# Patient Record
Sex: Male | Born: 1935
Health system: Southern US, Community
[De-identification: ages and names within clinical notes are randomized; demographics above are authoritative.]

## PROBLEM LIST (undated history)

## (undated) DIAGNOSIS — G629 Polyneuropathy, unspecified: Secondary | ICD-10-CM

## (undated) DIAGNOSIS — M25569 Pain in unspecified knee: Secondary | ICD-10-CM

## (undated) DIAGNOSIS — I5022 Chronic systolic (congestive) heart failure: Secondary | ICD-10-CM

## (undated) DIAGNOSIS — M353 Polymyalgia rheumatica: Secondary | ICD-10-CM

## (undated) DIAGNOSIS — K219 Gastro-esophageal reflux disease without esophagitis: Secondary | ICD-10-CM

## (undated) DIAGNOSIS — I10 Essential (primary) hypertension: Secondary | ICD-10-CM

## (undated) DIAGNOSIS — M1990 Unspecified osteoarthritis, unspecified site: Secondary | ICD-10-CM

## (undated) DIAGNOSIS — Z8719 Personal history of other diseases of the digestive system: Secondary | ICD-10-CM

## (undated) DIAGNOSIS — E785 Hyperlipidemia, unspecified: Secondary | ICD-10-CM

## (undated) HISTORY — PX: VARICOSE VEIN SURGERY: SHX832

## (undated) HISTORY — PX: TONSILLECTOMY: SUR1361

## (undated) HISTORY — DX: Polymyalgia rheumatica: M35.3

## (undated) HISTORY — PX: CATARACT EXTRACTION: SUR2

## (undated) HISTORY — DX: Pain in unspecified knee: M25.569

## (undated) HISTORY — DX: Hyperlipidemia, unspecified: E78.5

## (undated) HISTORY — DX: Gastro-esophageal reflux disease without esophagitis: K21.9

## (undated) HISTORY — DX: Unspecified osteoarthritis, unspecified site: M19.90

## (undated) HISTORY — PX: CARPAL TUNNEL RELEASE: SHX101

## (undated) HISTORY — DX: Essential (primary) hypertension: I10

---

## 1999-11-01 ENCOUNTER — Encounter: Payer: Self-pay | Admitting: *Deleted

## 1999-11-01 ENCOUNTER — Emergency Department (HOSPITAL_COMMUNITY): Admission: EM | Admit: 1999-11-01 | Discharge: 1999-11-01 | Payer: Self-pay | Admitting: *Deleted

## 2000-01-22 ENCOUNTER — Other Ambulatory Visit: Admission: RE | Admit: 2000-01-22 | Discharge: 2000-01-22 | Payer: Self-pay | Admitting: Gastroenterology

## 2000-01-22 ENCOUNTER — Encounter (INDEPENDENT_AMBULATORY_CARE_PROVIDER_SITE_OTHER): Payer: Self-pay

## 2004-02-28 ENCOUNTER — Ambulatory Visit: Payer: Self-pay | Admitting: Internal Medicine

## 2004-03-06 ENCOUNTER — Ambulatory Visit: Payer: Self-pay | Admitting: Internal Medicine

## 2004-06-14 ENCOUNTER — Ambulatory Visit: Payer: Self-pay | Admitting: Internal Medicine

## 2004-08-08 ENCOUNTER — Ambulatory Visit: Payer: Self-pay | Admitting: Internal Medicine

## 2004-09-17 ENCOUNTER — Ambulatory Visit (HOSPITAL_COMMUNITY): Admission: RE | Admit: 2004-09-17 | Discharge: 2004-09-17 | Payer: Self-pay | Admitting: Neurology

## 2004-09-18 ENCOUNTER — Ambulatory Visit: Payer: Self-pay | Admitting: Internal Medicine

## 2004-11-21 ENCOUNTER — Ambulatory Visit: Payer: Self-pay | Admitting: Internal Medicine

## 2004-11-28 ENCOUNTER — Ambulatory Visit: Payer: Self-pay | Admitting: Internal Medicine

## 2005-02-05 ENCOUNTER — Ambulatory Visit: Payer: Self-pay | Admitting: Internal Medicine

## 2005-02-21 ENCOUNTER — Ambulatory Visit: Payer: Self-pay | Admitting: Internal Medicine

## 2005-02-28 ENCOUNTER — Ambulatory Visit: Payer: Self-pay | Admitting: Internal Medicine

## 2005-03-25 HISTORY — PX: COLONOSCOPY: SHX174

## 2005-04-24 ENCOUNTER — Ambulatory Visit: Payer: Self-pay | Admitting: Internal Medicine

## 2005-04-29 ENCOUNTER — Ambulatory Visit: Payer: Self-pay | Admitting: Internal Medicine

## 2005-05-23 ENCOUNTER — Ambulatory Visit: Payer: Self-pay | Admitting: Internal Medicine

## 2005-05-29 ENCOUNTER — Ambulatory Visit: Payer: Self-pay | Admitting: Internal Medicine

## 2005-07-24 ENCOUNTER — Ambulatory Visit: Payer: Self-pay | Admitting: Gastroenterology

## 2005-07-30 ENCOUNTER — Ambulatory Visit: Payer: Self-pay | Admitting: Gastroenterology

## 2005-10-29 ENCOUNTER — Ambulatory Visit: Payer: Self-pay | Admitting: Internal Medicine

## 2005-11-21 ENCOUNTER — Ambulatory Visit: Payer: Self-pay | Admitting: Internal Medicine

## 2005-11-22 ENCOUNTER — Ambulatory Visit: Payer: Self-pay | Admitting: Internal Medicine

## 2005-12-04 ENCOUNTER — Ambulatory Visit: Payer: Self-pay | Admitting: Internal Medicine

## 2006-01-15 ENCOUNTER — Ambulatory Visit: Payer: Self-pay | Admitting: Internal Medicine

## 2006-02-06 ENCOUNTER — Ambulatory Visit: Payer: Self-pay | Admitting: Internal Medicine

## 2006-04-01 ENCOUNTER — Ambulatory Visit: Payer: Self-pay | Admitting: Internal Medicine

## 2006-04-17 ENCOUNTER — Ambulatory Visit: Payer: Self-pay | Admitting: Internal Medicine

## 2006-06-19 ENCOUNTER — Ambulatory Visit: Payer: Self-pay | Admitting: Internal Medicine

## 2006-07-31 ENCOUNTER — Ambulatory Visit: Payer: Self-pay | Admitting: Internal Medicine

## 2006-09-16 ENCOUNTER — Ambulatory Visit: Payer: Self-pay | Admitting: Internal Medicine

## 2006-09-19 ENCOUNTER — Encounter: Admission: RE | Admit: 2006-09-19 | Discharge: 2006-09-19 | Payer: Self-pay | Admitting: Internal Medicine

## 2006-09-22 DIAGNOSIS — E785 Hyperlipidemia, unspecified: Secondary | ICD-10-CM | POA: Insufficient documentation

## 2006-09-22 DIAGNOSIS — K219 Gastro-esophageal reflux disease without esophagitis: Secondary | ICD-10-CM | POA: Insufficient documentation

## 2006-09-22 DIAGNOSIS — M1990 Unspecified osteoarthritis, unspecified site: Secondary | ICD-10-CM

## 2006-09-22 DIAGNOSIS — I10 Essential (primary) hypertension: Secondary | ICD-10-CM | POA: Insufficient documentation

## 2006-10-24 ENCOUNTER — Ambulatory Visit: Payer: Self-pay | Admitting: Internal Medicine

## 2006-11-06 ENCOUNTER — Encounter: Payer: Self-pay | Admitting: Internal Medicine

## 2006-11-24 LAB — HM COLONOSCOPY

## 2006-12-11 ENCOUNTER — Ambulatory Visit: Payer: Self-pay | Admitting: Internal Medicine

## 2007-01-07 ENCOUNTER — Ambulatory Visit: Payer: Self-pay | Admitting: Internal Medicine

## 2007-01-07 DIAGNOSIS — R634 Abnormal weight loss: Secondary | ICD-10-CM | POA: Insufficient documentation

## 2007-01-09 LAB — CONVERTED CEMR LAB
ALT: 13 units/L (ref 0–53)
AST: 22 units/L (ref 0–37)
Albumin: 3.6 g/dL (ref 3.5–5.2)
Alkaline Phosphatase: 45 units/L (ref 39–117)
BUN: 11 mg/dL (ref 6–23)
Basophils Absolute: 0 10*3/uL (ref 0.0–0.1)
Basophils Relative: 0.7 % (ref 0.0–1.0)
Bilirubin, Direct: 0.2 mg/dL (ref 0.0–0.3)
CO2: 30 meq/L (ref 19–32)
Calcium: 9.9 mg/dL (ref 8.4–10.5)
Chloride: 97 meq/L (ref 96–112)
Creatinine, Ser: 0.8 mg/dL (ref 0.4–1.5)
Eosinophils Absolute: 0 10*3/uL (ref 0.0–0.6)
Eosinophils Relative: 0.7 % (ref 0.0–5.0)
GFR calc Af Amer: 122 mL/min
GFR calc non Af Amer: 101 mL/min
Glucose, Bld: 112 mg/dL — ABNORMAL HIGH (ref 70–99)
HCT: 40.8 % (ref 39.0–52.0)
Hemoglobin: 13.5 g/dL (ref 13.0–17.0)
Lymphocytes Relative: 16.4 % (ref 12.0–46.0)
MCHC: 33 g/dL (ref 30.0–36.0)
MCV: 88.8 fL (ref 78.0–100.0)
Monocytes Absolute: 0.7 10*3/uL (ref 0.2–0.7)
Monocytes Relative: 10.1 % (ref 3.0–11.0)
Neutro Abs: 5.1 10*3/uL (ref 1.4–7.7)
Neutrophils Relative %: 72.1 % (ref 43.0–77.0)
Platelets: 240 10*3/uL (ref 150–400)
Potassium: 3.9 meq/L (ref 3.5–5.1)
RBC: 4.6 M/uL (ref 4.22–5.81)
RDW: 14.8 % — ABNORMAL HIGH (ref 11.5–14.6)
Sodium: 135 meq/L (ref 135–145)
Total Bilirubin: 1.1 mg/dL (ref 0.3–1.2)
Total Protein: 6.3 g/dL (ref 6.0–8.3)
WBC: 6.9 10*3/uL (ref 4.5–10.5)

## 2007-01-12 ENCOUNTER — Ambulatory Visit: Payer: Self-pay | Admitting: Internal Medicine

## 2007-02-11 ENCOUNTER — Ambulatory Visit: Payer: Self-pay | Admitting: Internal Medicine

## 2007-02-11 LAB — CONVERTED CEMR LAB
HDL: 89.1 mg/dL (ref >39.0)
Total Bilirubin: 0.8 mg/dL (ref 0.3–1.2)
Total CHOL/HDL Ratio: 2.7
Total Protein: 6.7 g/dL (ref 6.0–8.3)
Triglycerides: 60 mg/dL (ref 0–149)

## 2007-02-17 ENCOUNTER — Ambulatory Visit: Payer: Self-pay | Admitting: Internal Medicine

## 2007-03-12 ENCOUNTER — Telehealth: Payer: Self-pay | Admitting: Internal Medicine

## 2007-03-24 ENCOUNTER — Ambulatory Visit: Payer: Self-pay | Admitting: Internal Medicine

## 2007-03-24 LAB — CONVERTED CEMR LAB: Cholesterol, target level: 200 mg/dL

## 2007-04-16 ENCOUNTER — Ambulatory Visit: Payer: Self-pay | Admitting: Gastroenterology

## 2007-05-21 ENCOUNTER — Telehealth: Payer: Self-pay | Admitting: Internal Medicine

## 2007-06-16 ENCOUNTER — Ambulatory Visit: Payer: Self-pay | Admitting: Internal Medicine

## 2007-06-16 LAB — CONVERTED CEMR LAB
AST: 22 units/L (ref 0–37)
Alkaline Phosphatase: 37 units/L — ABNORMAL LOW (ref 39–117)
Bilirubin, Direct: 0.2 mg/dL (ref 0.0–0.3)
Total CHOL/HDL Ratio: 3.4
Total Protein: 6.2 g/dL (ref 6.0–8.3)
VLDL: 12 mg/dL (ref 0–40)

## 2007-06-23 ENCOUNTER — Ambulatory Visit: Payer: Self-pay | Admitting: Internal Medicine

## 2007-07-09 ENCOUNTER — Telehealth: Payer: Self-pay | Admitting: Internal Medicine

## 2007-08-12 ENCOUNTER — Telehealth: Payer: Self-pay | Admitting: Internal Medicine

## 2007-09-24 ENCOUNTER — Ambulatory Visit: Payer: Self-pay | Admitting: Internal Medicine

## 2007-09-24 DIAGNOSIS — G63 Polyneuropathy in diseases classified elsewhere: Secondary | ICD-10-CM | POA: Insufficient documentation

## 2007-12-04 ENCOUNTER — Ambulatory Visit: Payer: Self-pay | Admitting: Internal Medicine

## 2007-12-15 ENCOUNTER — Encounter: Admission: RE | Admit: 2007-12-15 | Discharge: 2008-03-14 | Payer: Self-pay | Admitting: Internal Medicine

## 2007-12-17 ENCOUNTER — Telehealth: Payer: Self-pay | Admitting: Internal Medicine

## 2007-12-18 ENCOUNTER — Telehealth: Payer: Self-pay | Admitting: Internal Medicine

## 2007-12-28 ENCOUNTER — Ambulatory Visit: Payer: Self-pay | Admitting: Internal Medicine

## 2007-12-28 DIAGNOSIS — F341 Dysthymic disorder: Secondary | ICD-10-CM | POA: Insufficient documentation

## 2007-12-28 DIAGNOSIS — K5909 Other constipation: Secondary | ICD-10-CM | POA: Insufficient documentation

## 2007-12-28 DIAGNOSIS — M25519 Pain in unspecified shoulder: Secondary | ICD-10-CM

## 2007-12-28 LAB — CONVERTED CEMR LAB
ALT: 15 units/L (ref 0–53)
AST: 19 units/L (ref 0–37)
BUN: 14 mg/dL (ref 6–23)
Basophils Absolute: 0 10*3/uL (ref 0.0–0.1)
Beta Globulin: 6.1 % (ref 4.7–7.2)
CO2: 34 meq/L — ABNORMAL HIGH (ref 19–32)
Calcium: 9.6 mg/dL (ref 8.4–10.5)
Chloride: 100 meq/L (ref 96–112)
Creatinine, Ser: 0.8 mg/dL (ref 0.4–1.5)
Eosinophils Relative: 0.9 % (ref 0.0–5.0)
GFR calc Af Amer: 122 mL/min
Gamma Globulin: 12.1 % (ref 11.1–18.8)
Glucose, Bld: 106 mg/dL — ABNORMAL HIGH (ref 70–99)
Hemoglobin: 12.8 g/dL — ABNORMAL LOW (ref 13.0–17.0)
Lymphocytes Relative: 16 % (ref 12.0–46.0)
MCHC: 35.2 g/dL (ref 30.0–36.0)
MCV: 95.2 fL (ref 78.0–100.0)
Monocytes Absolute: 0.5 10*3/uL (ref 0.1–1.0)
Monocytes Relative: 7.9 % (ref 3.0–12.0)
Neutrophils Relative %: 75.2 % (ref 43.0–77.0)
PSA: 1.41 ng/mL (ref 0.10–4.00)
Total Bilirubin: 0.5 mg/dL (ref 0.3–1.2)
Total Protein, Serum Electrophoresis: 6.6 g/dL (ref 6.0–8.3)
WBC: 6.9 10*3/uL (ref 4.5–10.5)

## 2007-12-29 ENCOUNTER — Encounter: Payer: Self-pay | Admitting: Internal Medicine

## 2008-01-01 ENCOUNTER — Telehealth: Payer: Self-pay | Admitting: Internal Medicine

## 2008-01-13 ENCOUNTER — Telehealth: Payer: Self-pay | Admitting: Internal Medicine

## 2008-01-18 ENCOUNTER — Ambulatory Visit: Payer: Self-pay | Admitting: Internal Medicine

## 2008-01-18 DIAGNOSIS — M353 Polymyalgia rheumatica: Secondary | ICD-10-CM | POA: Insufficient documentation

## 2008-01-18 DIAGNOSIS — R252 Cramp and spasm: Secondary | ICD-10-CM | POA: Insufficient documentation

## 2008-01-18 LAB — CONVERTED CEMR LAB
CO2: 33 meq/L — ABNORMAL HIGH (ref 19–32)
CRP, High Sensitivity: 3 (ref 0.00–5.00)
Creatinine, Ser: 0.7 mg/dL (ref 0.4–1.5)
GFR calc Af Amer: 142 mL/min
Glucose, Bld: 98 mg/dL (ref 70–99)
Potassium: 4 meq/L (ref 3.5–5.1)
Sed Rate: 18 mm/hr — ABNORMAL HIGH (ref 0–16)
Sodium: 135 meq/L (ref 135–145)

## 2008-01-25 ENCOUNTER — Telehealth: Payer: Self-pay | Admitting: *Deleted

## 2008-02-01 ENCOUNTER — Ambulatory Visit: Payer: Self-pay | Admitting: Internal Medicine

## 2008-02-01 ENCOUNTER — Telehealth: Payer: Self-pay | Admitting: Internal Medicine

## 2008-02-11 ENCOUNTER — Telehealth: Payer: Self-pay | Admitting: Internal Medicine

## 2008-03-01 ENCOUNTER — Ambulatory Visit: Payer: Self-pay | Admitting: Internal Medicine

## 2008-03-01 DIAGNOSIS — H6120 Impacted cerumen, unspecified ear: Secondary | ICD-10-CM

## 2008-03-01 DIAGNOSIS — K4020 Bilateral inguinal hernia, without obstruction or gangrene, not specified as recurrent: Secondary | ICD-10-CM | POA: Insufficient documentation

## 2008-03-01 LAB — CONVERTED CEMR LAB
CO2: 36 meq/L — ABNORMAL HIGH (ref 19–32)
Calcium: 9.9 mg/dL (ref 8.4–10.5)
Creatinine, Ser: 0.8 mg/dL (ref 0.4–1.5)
Eosinophils Relative: 0.6 % (ref 0.0–5.0)
Folate: >20 ng/mL
GFR calc Af Amer: 122 mL/min
GFR calc non Af Amer: 100 mL/min
Glucose, Bld: 97 mg/dL (ref 70–99)
Lymphocytes Relative: 24.6 % (ref 12.0–46.0)
Monocytes Absolute: 0.7 10*3/uL (ref 0.1–1.0)
Neutro Abs: 4.7 10*3/uL (ref 1.4–7.7)
Platelets: 195 10*3/uL (ref 150–400)
RDW: 12.9 % (ref 11.5–14.6)
TSH: 3.22 microintl units/mL (ref 0.35–5.50)
WBC: 7.1 10*3/uL (ref 4.5–10.5)

## 2008-03-09 ENCOUNTER — Telehealth: Payer: Self-pay | Admitting: Internal Medicine

## 2008-05-31 ENCOUNTER — Ambulatory Visit: Payer: Self-pay | Admitting: Internal Medicine

## 2008-05-31 LAB — CONVERTED CEMR LAB
Basophils Absolute: 0 10*3/uL (ref 0.0–0.1)
CO2: 35 meq/L — ABNORMAL HIGH (ref 19–32)
CRP, High Sensitivity: 2 (ref 0.00–5.00)
Creatinine, Ser: 0.7 mg/dL (ref 0.4–1.5)
Eosinophils Relative: 0.8 % (ref 0.0–5.0)
GFR calc Af Amer: 142 mL/min
GFR calc non Af Amer: 117 mL/min
Glucose, Bld: 90 mg/dL (ref 70–99)
Hemoglobin: 13.8 g/dL (ref 13.0–17.0)
MCHC: 33.8 g/dL (ref 30.0–36.0)
Monocytes Relative: 12.3 % — ABNORMAL HIGH (ref 3.0–12.0)
Neutro Abs: 4.9 10*3/uL (ref 1.4–7.7)
Neutrophils Relative %: 63.3 % (ref 43.0–77.0)
Platelets: 206 10*3/uL (ref 150–400)
RDW: 12.6 % (ref 11.5–14.6)
WBC: 7.7 10*3/uL (ref 4.5–10.5)

## 2008-06-02 ENCOUNTER — Telehealth: Payer: Self-pay | Admitting: Internal Medicine

## 2008-08-01 ENCOUNTER — Telehealth: Payer: Self-pay | Admitting: Internal Medicine

## 2008-08-01 ENCOUNTER — Ambulatory Visit: Payer: Self-pay | Admitting: Internal Medicine

## 2008-08-01 DIAGNOSIS — L851 Acquired keratosis [keratoderma] palmaris et plantaris: Secondary | ICD-10-CM | POA: Insufficient documentation

## 2008-08-01 DIAGNOSIS — R091 Pleurisy: Secondary | ICD-10-CM | POA: Insufficient documentation

## 2008-08-01 DIAGNOSIS — H28 Cataract in diseases classified elsewhere: Secondary | ICD-10-CM | POA: Insufficient documentation

## 2008-08-02 ENCOUNTER — Telehealth: Payer: Self-pay | Admitting: Internal Medicine

## 2008-09-05 ENCOUNTER — Ambulatory Visit: Payer: Self-pay | Admitting: Internal Medicine

## 2008-09-08 ENCOUNTER — Ambulatory Visit: Payer: Self-pay | Admitting: Internal Medicine

## 2008-10-06 ENCOUNTER — Telehealth (INDEPENDENT_AMBULATORY_CARE_PROVIDER_SITE_OTHER): Payer: Self-pay | Admitting: *Deleted

## 2008-10-07 ENCOUNTER — Ambulatory Visit: Payer: Self-pay | Admitting: Internal Medicine

## 2008-10-07 DIAGNOSIS — M67919 Unspecified disorder of synovium and tendon, unspecified shoulder: Secondary | ICD-10-CM | POA: Insufficient documentation

## 2008-10-07 DIAGNOSIS — M719 Bursopathy, unspecified: Secondary | ICD-10-CM

## 2008-10-11 ENCOUNTER — Telehealth: Payer: Self-pay | Admitting: Internal Medicine

## 2008-10-20 ENCOUNTER — Ambulatory Visit: Payer: Self-pay | Admitting: Internal Medicine

## 2008-10-20 LAB — CONVERTED CEMR LAB: Folate: >20 ng/mL

## 2008-11-04 ENCOUNTER — Ambulatory Visit: Payer: Self-pay | Admitting: Internal Medicine

## 2008-11-08 ENCOUNTER — Telehealth: Payer: Self-pay | Admitting: Internal Medicine

## 2008-11-09 ENCOUNTER — Telehealth (INDEPENDENT_AMBULATORY_CARE_PROVIDER_SITE_OTHER): Payer: Self-pay | Admitting: *Deleted

## 2008-11-18 ENCOUNTER — Ambulatory Visit: Payer: Self-pay | Admitting: Internal Medicine

## 2008-11-18 ENCOUNTER — Telehealth: Payer: Self-pay | Admitting: Internal Medicine

## 2008-11-22 ENCOUNTER — Ambulatory Visit: Payer: Self-pay | Admitting: Internal Medicine

## 2008-11-22 LAB — CONVERTED CEMR LAB
Anti Nuclear Antibody(ANA): NEGATIVE
Vitamin B-12: >1500 pg/mL — ABNORMAL HIGH (ref 211–911)

## 2008-11-24 ENCOUNTER — Telehealth: Payer: Self-pay | Admitting: *Deleted

## 2008-12-26 ENCOUNTER — Ambulatory Visit: Payer: Self-pay | Admitting: Internal Medicine

## 2008-12-26 LAB — CONVERTED CEMR LAB
BUN: 15 mg/dL (ref 6–23)
Basophils Absolute: 0 10*3/uL (ref 0.0–0.1)
CRP, High Sensitivity: 1.2 (ref 0.00–5.00)
Calcium: 9.5 mg/dL (ref 8.4–10.5)
Creatinine, Ser: 0.8 mg/dL (ref 0.4–1.5)
Eosinophils Absolute: 0 10*3/uL (ref 0.0–0.7)
Eosinophils Relative: 0.3 % (ref 0.0–5.0)
GFR calc non Af Amer: 100.1 mL/min (ref >60)
HCT: 42.9 % (ref 39.0–52.0)
Lymphs Abs: 0.8 10*3/uL (ref 0.7–4.0)
MCHC: 33 g/dL (ref 30.0–36.0)
MCV: 97.5 fL (ref 78.0–100.0)
Monocytes Absolute: 0.4 10*3/uL (ref 0.1–1.0)
Monocytes Relative: 4.7 % (ref 3.0–12.0)
Neutro Abs: 7.2 10*3/uL (ref 1.4–7.7)
Platelets: 218 10*3/uL (ref 150.0–400.0)
Sed Rate: 27 mm/hr — ABNORMAL HIGH (ref 0–22)
Vitamin B-12: 1160 pg/mL — ABNORMAL HIGH (ref 211–911)

## 2009-01-26 ENCOUNTER — Telehealth: Payer: Self-pay | Admitting: Internal Medicine

## 2009-02-07 ENCOUNTER — Ambulatory Visit: Payer: Self-pay | Admitting: Internal Medicine

## 2009-02-07 DIAGNOSIS — D638 Anemia in other chronic diseases classified elsewhere: Secondary | ICD-10-CM | POA: Insufficient documentation

## 2009-02-07 DIAGNOSIS — C4490 Unspecified malignant neoplasm of skin, unspecified: Secondary | ICD-10-CM

## 2009-02-07 LAB — CONVERTED CEMR LAB
CRP, High Sensitivity: 0.7 (ref 0.00–5.00)
Folate: >20 ng/mL

## 2009-02-14 ENCOUNTER — Telehealth: Payer: Self-pay | Admitting: Internal Medicine

## 2009-03-06 ENCOUNTER — Ambulatory Visit: Payer: Self-pay | Admitting: Internal Medicine

## 2009-03-27 ENCOUNTER — Telehealth: Payer: Self-pay | Admitting: Internal Medicine

## 2009-03-28 ENCOUNTER — Ambulatory Visit: Payer: Self-pay | Admitting: Internal Medicine

## 2009-04-07 ENCOUNTER — Ambulatory Visit: Payer: Self-pay | Admitting: Internal Medicine

## 2009-05-08 ENCOUNTER — Ambulatory Visit: Payer: Self-pay | Admitting: Internal Medicine

## 2009-05-11 LAB — CONVERTED CEMR LAB
Basophils Relative: 1 % (ref 0.0–3.0)
Eosinophils Relative: 1.4 % (ref 0.0–5.0)
Glucose, Bld: 97 mg/dL (ref 70–99)
Hemoglobin: 13.1 g/dL (ref 13.0–17.0)
Lymphocytes Relative: 24.8 % (ref 12.0–46.0)
Lymphs Abs: 1.4 10*3/uL (ref 0.7–4.0)
MCHC: 33.7 g/dL (ref 30.0–36.0)
RBC: 3.99 M/uL — ABNORMAL LOW (ref 4.22–5.81)

## 2009-06-13 ENCOUNTER — Ambulatory Visit: Payer: Self-pay | Admitting: Internal Medicine

## 2009-06-19 ENCOUNTER — Telehealth: Payer: Self-pay | Admitting: Internal Medicine

## 2009-06-19 ENCOUNTER — Ambulatory Visit: Payer: Self-pay | Admitting: Internal Medicine

## 2009-06-19 DIAGNOSIS — M5126 Other intervertebral disc displacement, lumbar region: Secondary | ICD-10-CM

## 2009-06-20 ENCOUNTER — Encounter: Admission: RE | Admit: 2009-06-20 | Discharge: 2009-06-20 | Payer: Self-pay | Admitting: Internal Medicine

## 2009-06-23 ENCOUNTER — Encounter: Admission: RE | Admit: 2009-06-23 | Discharge: 2009-06-23 | Payer: Self-pay | Admitting: Internal Medicine

## 2009-07-04 ENCOUNTER — Ambulatory Visit: Payer: Self-pay | Admitting: Internal Medicine

## 2009-07-04 DIAGNOSIS — M720 Palmar fascial fibromatosis [Dupuytren]: Secondary | ICD-10-CM

## 2009-07-04 HISTORY — DX: Palmar fascial fibromatosis (dupuytren): M72.0

## 2009-07-06 ENCOUNTER — Telehealth: Payer: Self-pay | Admitting: Internal Medicine

## 2009-07-06 LAB — CONVERTED CEMR LAB: Sed Rate: 17 mm/hr (ref 0–22)

## 2009-07-21 ENCOUNTER — Ambulatory Visit: Payer: Self-pay | Admitting: Internal Medicine

## 2009-08-25 ENCOUNTER — Ambulatory Visit: Payer: Self-pay | Admitting: Internal Medicine

## 2009-09-08 ENCOUNTER — Ambulatory Visit: Payer: Self-pay | Admitting: Internal Medicine

## 2009-09-11 LAB — CONVERTED CEMR LAB
Eosinophils Absolute: 0.1 10*3/uL (ref 0.0–0.7)
Eosinophils Relative: 1.4 % (ref 0.0–5.0)
HCT: 36.7 % — ABNORMAL LOW (ref 39.0–52.0)
Hemoglobin: 12.5 g/dL — ABNORMAL LOW (ref 13.0–17.0)
Iron: 111 ug/dL (ref 42–165)
Lymphocytes Relative: 15.6 % (ref 12.0–46.0)
Lymphs Abs: 1.3 10*3/uL (ref 0.7–4.0)
MCHC: 34.1 g/dL (ref 30.0–36.0)
Monocytes Absolute: 0.9 10*3/uL (ref 0.1–1.0)
Monocytes Relative: 11.3 % (ref 3.0–12.0)
RBC: 3.87 M/uL — ABNORMAL LOW (ref 4.22–5.81)
RDW: 13.3 % (ref 11.5–14.6)

## 2009-09-22 ENCOUNTER — Ambulatory Visit: Payer: Self-pay | Admitting: Internal Medicine

## 2009-10-10 ENCOUNTER — Encounter: Payer: Self-pay | Admitting: Internal Medicine

## 2009-10-16 ENCOUNTER — Telehealth: Payer: Self-pay | Admitting: Internal Medicine

## 2009-10-16 ENCOUNTER — Ambulatory Visit: Payer: Self-pay | Admitting: Internal Medicine

## 2009-10-16 DIAGNOSIS — M19249 Secondary osteoarthritis, unspecified hand: Secondary | ICD-10-CM

## 2009-10-16 LAB — CONVERTED CEMR LAB
Rhuematoid fact SerPl-aCnc: <20 intl units/mL (ref 0–20)
Sed Rate: 33 mm/hr — ABNORMAL HIGH (ref 0–22)
Vitamin B-12: 1235 pg/mL — ABNORMAL HIGH (ref 211–911)

## 2009-10-27 ENCOUNTER — Encounter: Payer: Self-pay | Admitting: Internal Medicine

## 2009-10-30 ENCOUNTER — Ambulatory Visit: Payer: Self-pay | Admitting: Internal Medicine

## 2009-11-03 ENCOUNTER — Telehealth: Payer: Self-pay | Admitting: Internal Medicine

## 2009-11-07 ENCOUNTER — Telehealth: Payer: Self-pay | Admitting: Internal Medicine

## 2009-11-10 ENCOUNTER — Encounter: Payer: Self-pay | Admitting: Internal Medicine

## 2009-11-13 ENCOUNTER — Encounter: Admission: RE | Admit: 2009-11-13 | Discharge: 2009-11-13 | Payer: Self-pay | Admitting: Orthopedic Surgery

## 2009-11-13 ENCOUNTER — Encounter: Payer: Self-pay | Admitting: Internal Medicine

## 2009-11-16 ENCOUNTER — Ambulatory Visit (HOSPITAL_BASED_OUTPATIENT_CLINIC_OR_DEPARTMENT_OTHER): Admission: RE | Admit: 2009-11-16 | Discharge: 2009-11-16 | Payer: Self-pay | Admitting: Orthopedic Surgery

## 2009-11-30 ENCOUNTER — Ambulatory Visit: Payer: Self-pay | Admitting: Family Medicine

## 2009-11-30 LAB — CONVERTED CEMR LAB
Bilirubin Urine: NEGATIVE
Ketones, urine, test strip: NEGATIVE
Urobilinogen, UA: 0.2

## 2009-12-01 LAB — CONVERTED CEMR LAB
CO2: 32 meq/L (ref 19–32)
Chloride: 93 meq/L — ABNORMAL LOW (ref 96–112)
Creatinine, Ser: 0.7 mg/dL (ref 0.4–1.5)
GFR calc non Af Amer: 112.76 mL/min (ref >60)
Sodium: 134 meq/L — ABNORMAL LOW (ref 135–145)

## 2009-12-08 ENCOUNTER — Encounter: Payer: Self-pay | Admitting: Internal Medicine

## 2009-12-12 ENCOUNTER — Ambulatory Visit: Payer: Self-pay | Admitting: Internal Medicine

## 2009-12-12 LAB — CONVERTED CEMR LAB
ALT: 20 units/L (ref 0–53)
Albumin: 3.6 g/dL (ref 3.5–5.2)
BUN: 16 mg/dL (ref 6–23)
Bilirubin, Direct: 0.1 mg/dL (ref 0.0–0.3)
CO2: 24 meq/L (ref 19–32)
Chloride: 101 meq/L (ref 96–112)
Cholesterol: 249 mg/dL — ABNORMAL HIGH (ref 0–200)
Direct LDL: 148.5 mg/dL
Eosinophils Relative: 1.8 % (ref 0.0–5.0)
GFR calc non Af Amer: 116.47 mL/min (ref >60)
Lymphocytes Relative: 20.6 % (ref 12.0–46.0)
Lymphs Abs: 1.3 10*3/uL (ref 0.7–4.0)
Monocytes Absolute: 0.7 10*3/uL (ref 0.1–1.0)
Neutro Abs: 4.3 10*3/uL (ref 1.4–7.7)
PSA: 1.39 ng/mL (ref 0.10–4.00)
Potassium: 4.7 meq/L (ref 3.5–5.1)
Total Protein: 6.3 g/dL (ref 6.0–8.3)
VLDL: 13.8 mg/dL (ref 0.0–40.0)

## 2009-12-14 ENCOUNTER — Encounter (INDEPENDENT_AMBULATORY_CARE_PROVIDER_SITE_OTHER): Payer: Self-pay | Admitting: *Deleted

## 2009-12-15 ENCOUNTER — Encounter: Payer: Self-pay | Admitting: Internal Medicine

## 2009-12-15 ENCOUNTER — Ambulatory Visit: Payer: Self-pay

## 2009-12-26 ENCOUNTER — Telehealth: Payer: Self-pay | Admitting: Internal Medicine

## 2010-01-05 ENCOUNTER — Encounter: Payer: Self-pay | Admitting: Internal Medicine

## 2010-01-08 ENCOUNTER — Telehealth: Payer: Self-pay | Admitting: Internal Medicine

## 2010-02-12 ENCOUNTER — Ambulatory Visit: Payer: Self-pay | Admitting: Internal Medicine

## 2010-02-12 DIAGNOSIS — I8310 Varicose veins of unspecified lower extremity with inflammation: Secondary | ICD-10-CM

## 2010-02-12 LAB — CONVERTED CEMR LAB
CRP, High Sensitivity: 2.09 (ref 0.00–5.00)
Sed Rate: 25 mm/hr — ABNORMAL HIGH (ref 0–22)

## 2010-02-20 ENCOUNTER — Encounter: Admission: RE | Admit: 2010-02-20 | Discharge: 2010-02-20 | Payer: Self-pay | Admitting: Internal Medicine

## 2010-04-03 ENCOUNTER — Ambulatory Visit
Admission: RE | Admit: 2010-04-03 | Discharge: 2010-04-03 | Payer: Self-pay | Source: Home / Self Care | Attending: Internal Medicine | Admitting: Internal Medicine

## 2010-04-15 ENCOUNTER — Encounter: Payer: Self-pay | Admitting: Internal Medicine

## 2010-04-24 NOTE — Progress Notes (Signed)
Summary: results to [facility]  Phone Note From Other Clinic   Summary of Call: Need results of test determining carpal tunnel sent to Dr. Teressa Senter, phone [phone], Fax?  Seeing him on Fri.  Dr. Shela Commons referred me there. Initial call taken by: Rudy Jew, RN,  November 07, 2009 10:13 AM  Follow-up for Phone Call        faxed todasy Follow-up by: Willy Eddy, LPN,  November 07, 2009 11:08 AM

## 2010-04-24 NOTE — Letter (Signed)
Summary: Patient requesting Rx for ED  Patient requesting Rx for ED   Imported By: Maryln Gottron 10/12/2009 12:56:38  _____________________________________________________________________  External Attachment:    Type:   Image     Comment:   External Document

## 2010-04-24 NOTE — Assessment & Plan Note (Signed)
Summary: b12 inj/njr   Nurse Visit   Allergies: 1)  ! * Statins  Medication Administration  Injection # 1:    Medication: Vit B12 1000 mcg    Diagnosis: ANEMIA OF CHRONIC DISEASE (ICD-285.29)    Route: IM    Site: L deltoid    Exp Date: 12/15/2010    Lot #: [id number]    Mfr: [employer] Regent    Patient tolerated injection without complications    Given by: Willy Eddy, LPN (August 25, 1608 12:46 PM)  Orders Added: 1)  Vit B12 1000 mcg [J3420] 2)  Admin of Therapeutic Inj  intramuscular or subcutaneous [[id number]]

## 2010-04-24 NOTE — Assessment & Plan Note (Signed)
Summary: 2 month fup//ccm   Vital Signs:  Patient profile:   77 year old male Height:      73 inches Weight:      185 pounds BMI:     24.50 Temp:     98.3 degrees F oral Pulse rate:   76 / minute Resp:     14 per minute BP sitting:   128 / 60  (left arm)  Vitals Entered By: Willy Eddy, LPN (July 04, 2009 9:23 AM) CC: roa- pt states back injection "helped tremendously"., Hypertension Management   Primary Care Provider:  Stacie Glaze MD  CC:  roa- pt states back injection "helped tremendously". and Hypertension Management.  History of Present Illness: THE EPIDURAL HELPED the shot did "wonders" for a while now resumed mild nocturnal pain the pt can have three shots new complaint of contraction on hand forth digit, locks, painfull blood pressure in controll PMR stable with no increase in pain  Hypertension History:      He denies headache, chest pain, palpitations, dyspnea with exertion, orthopnea, PND, peripheral edema, visual symptoms, neurologic problems, syncope, and side effects from treatment.        Positive major cardiovascular risk factors include male age 5 years old or older, hyperlipidemia, and hypertension.  Negative major cardiovascular risk factors include negative family history for ischemic heart disease and non-tobacco-user status.     Preventive Screening-Counseling & Management  Alcohol-Tobacco     Smoking Status: never     Passive Smoke Exposure: no  Problems Prior to Update: 1)  Degenerative Disc Disease, Lumbosacral Spine W/radiculopathy  (ICD-722.10) 2)  Anemia of Chronic Disease  (ICD-285.29) 3)  Carcinoma, Basal Cell  (ICD-173.9) 4)  Bursitis, Right Shoulder  (ICD-726.10) 5)  Pleurisy  (ICD-511.0) 6)  Dry Skin  (ICD-701.1) 7)  Cataract Associated With Other Syndromes  (ICD-366.44) 8)  Cerumen Impaction, Bilateral  (ICD-380.4) 9)  Ing Hernia w/o Mention Obstruction/gangren Bilat  (ICD-550.92) 10)  Cramp in Limb  (ICD-729.82) 11)   Polymyalgia Rheumatica  (ICD-725) 12)  Shoulder Pain, Right  (ICD-719.41) 13)  Back Pain, Acute  (ICD-724.5) 14)  Constipation, Drug Induced  (ICD-564.09) 15)  Depression, Situational, Acute  (ICD-300.4) 16)  Back Pain  (ICD-724.5) 17)  Polyneuropathy Other Diseases Classified [place]sw  (ICD-357.4) 18)  Cellulitis and Abscess of Upper Arm and Forearm  (ICD-682.3) 19)  Weight Loss, Abnormal  (ICD-783.21) 20)  Osteoarthritis  (ICD-715.90) 21)  Hypertension  (ICD-401.9) 22)  Hyperlipidemia  (ICD-272.4) 23)  Gerd  (ICD-530.81)  Current Problems (verified): 1)  Degenerative Disc Disease, Lumbosacral Spine W/radiculopathy  (ICD-722.10) 2)  Anemia of Chronic Disease  (ICD-285.29) 3)  Carcinoma, Basal Cell  (ICD-173.9) 4)  Bursitis, Right Shoulder  (ICD-726.10) 5)  Pleurisy  (ICD-511.0) 6)  Dry Skin  (ICD-701.1) 7)  Cataract Associated With Other Syndromes  (ICD-366.44) 8)  Cerumen Impaction, Bilateral  (ICD-380.4) 9)  Ing Hernia w/o Mention Obstruction/gangren Bilat  (ICD-550.92) 10)  Cramp in Limb  (ICD-729.82) 11)  Polymyalgia Rheumatica  (ICD-725) 12)  Shoulder Pain, Right  (ICD-719.41) 13)  Back Pain, Acute  (ICD-724.5) 14)  Constipation, Drug Induced  (ICD-564.09) 15)  Depression, Situational, Acute  (ICD-300.4) 16)  Back Pain  (ICD-724.5) 17)  Polyneuropathy Other Diseases Classified [place]sw  (ICD-357.4) 18)  Cellulitis and Abscess of Upper Arm and Forearm  (ICD-682.3) 19)  Weight Loss, Abnormal  (ICD-783.21) 20)  Osteoarthritis  (ICD-715.90) 21)  Hypertension  (ICD-401.9) 22)  Hyperlipidemia  (ICD-272.4) 23)  Gerd  (ICD-530.81)  Medications Prior to Update: 1)  Lotensin Hct 10-12.5 Mg  Tabs (Benazepril-Hydrochlorothiazide) .... Take One By Mouth Daily 2)  Atenolol 50 Mg  Tabs (Atenolol) .... Take 1/2 Tablet By Mouth Daily 3)  Pepcid 20 Mg  Tabs (Famotidine) .... Take One Tablet By Mouth Two Times A Day As Needed 4)  Adult Aspirin Ec Low Strength 81 Mg  Tbec (Aspirin) .... Take One  By Mouth Daily 5)  Niacin 500 Mg Tabs (Niacin) .Marland Kitchen.. 1 Two Times A Day 6)  Ra Saw Palmetto 80 Mg  Caps (Saw Palmetto (Serenoa Repens)) .... Once Daily 7)  Miralax   Powd (Polyethylene Glycol 3350) .... Two Times A Day 8)  Fish Oil Concentrate 1000 Mg  Caps (Omega-3 Fatty Acids) .... Two By Mouth Bid 9)  Calcium 500/d 500-400 Mg-Unit Chew (Calcium-Vitamin D) .... Two Times A Day 10)  Folbee 2.5-25-1 Mg Tabs (Folic Acid-Vit B6-Vit B12) .... One By Mouth Daily 11)  Prednisone 5 Mg Tabs (Prednisone) .... 1/2 Once Daily 12)  Cyanocobalamin 1000 Mcg/ml Soln (Cyanocobalamin) .Marland Kitchen.. 1ml Monthly 13)  Vimovo 500-20 Mg Tbec (Naproxen-Esomeprazole) .... One By Mouth Two Times A Day 14)  Soma 250 Mg Tabs (Carisoprodol) .... 1/2 To One By Mouth Daily  Current Medications (verified): 1)  Lotensin Hct 10-12.5 Mg  Tabs (Benazepril-Hydrochlorothiazide) .... Take One By Mouth Daily 2)  Atenolol 50 Mg  Tabs (Atenolol) .... Take 1/2 Tablet By Mouth Daily 3)  Pepcid 20 Mg  Tabs (Famotidine) .... Take One Tablet By Mouth Two Times A Day As Needed 4)  Adult Aspirin Ec Low Strength 81 Mg  Tbec (Aspirin) .... Take One By Mouth Daily 5)  Niacin 500 Mg Tabs (Niacin) .Marland Kitchen.. 1 Two Times A Day 6)  Ra Saw Palmetto 80 Mg  Caps (Saw Palmetto (Serenoa Repens)) .... Once Daily 7)  Miralax   Powd (Polyethylene Glycol 3350) .... Two Times A Day 8)  Fish Oil Concentrate 1000 Mg  Caps (Omega-3 Fatty Acids) .... Two By Mouth Bid 9)  Calcium 500/d 500-400 Mg-Unit Chew (Calcium-Vitamin D) .... Two Times A Day 10)  Folbee 2.5-25-1 Mg Tabs (Folic Acid-Vit B6-Vit B12) .... One By Mouth Daily 11)  Prednisone 5 Mg Tabs (Prednisone) .... 1/2 Once Daily 12)  Cyanocobalamin 1000 Mcg/ml Soln (Cyanocobalamin) .Marland Kitchen.. 1ml Monthly 13)  Vimovo 500-20 Mg Tbec (Naproxen-Esomeprazole) .... One By Mouth Two Times A Day 14)  Soma 250 Mg Tabs (Carisoprodol) .... 1/2 To One By Mouth Daily  Allergies (verified): 1)  ! * Statins  Past History:  Family  History: Last updated: 09/22/2006 Family History of Cardiovascular disorder Fam hx CAD Fam hx MI  Social History: Last updated: 12/04/2007 Retired Married Alcohol use-yes wife--deceased with cancer  Risk Factors: Smoking Status: never (07/04/2009) Passive Smoke Exposure: no (07/04/2009)  Past medical, surgical, family and social histories (including risk factors) reviewed, and no changes noted (except as noted below).  Past Medical History: Reviewed history from 01/18/2008 and no changes required. GERD Hyperlipidemia Hypertension Osteoarthritis-knees poly myagia rhematica  Past Surgical History: Reviewed history from 10/24/2006 and no changes required. Colonoscopy-07/30/2005 Tonsillectomy  Family History: Reviewed history from 09/22/2006 and no changes required. Family History of Cardiovascular disorder Fam hx CAD Fam hx MI  Social History: Reviewed history from 12/04/2007 and no changes required. Retired Married Alcohol use-yes wife--deceased with cancer  Review of Systems  The patient denies anorexia, fever, weight loss, weight gain, vision loss, decreased hearing, hoarseness, chest pain, syncope, dyspnea on exertion, peripheral edema, prolonged cough, headaches,  hemoptysis, abdominal pain, melena, hematochezia, severe indigestion/heartburn, hematuria, incontinence, genital sores, muscle weakness, suspicious skin lesions, transient blindness, difficulty walking, depression, unusual weight change, abnormal bleeding, enlarged lymph nodes, angioedema, and breast masses.         back pain  Physical Exam  General:  Well-developed, well-nourished, normal body habitus; no deformities, normal grooming.   Head:  normocephalic and male-pattern balding.   Eyes:  pupils equal, pupils round, pupils reactive to light, and no injection.   Ears:  R ear normal and L ear normal.   Nose:  External nasal examination shows no deformity or inflammation. Nasal mucosa are pink and  moist without lesions or exudates. Mouth:  pharynx pink and moist, no erythema, and no exudates.   Neck:  supple, full ROM, and no masses.   Lungs:  normal respiratory effort and no wheezes.   Heart:  normal rate and regular rhythm.   Abdomen:  soft, non-tender, and normal bowel sounds.   Msk:  lumbar lordosis, SI joint tenderness, and trigger point tenderness.   Pulses:  R and L carotid,radial,femoral,dorsalis pedis and posterior tibial pulses are full and equal bilaterally Extremities:  1+ left pedal edema and 1+ right pedal edema.     Impression & Recommendations:  Problem # 1:  DEGENERATIVE DISC DISEASE, LUMBOSACRAL SPINE W/RADICULOPATHY (ICD-722.10) Assessment Improved epidural helped significantly to resolve the back pain  Problem # 2:  DUPUYTREN'S CONTRACTURE, RIGHT (ICD-728.6) Assessment: New  Informed consen obtained and then the tendon was prepped in a sterile manor and 20 mg depo and 1/2 cc 1% lidocaine injected into the synovial space. After care discussed. Pt tolerated procedure well.  Orders: Injection, Tendon / Ligament (04540) Depo-Medrol 20mg  (J1020)  Problem # 3:  ANEMIA OF CHRONIC DISEASE (ICD-285.29)  His updated medication list for this problem includes:    Folbee 2.5-25-1 Mg Tabs (Folic acid-vit b6-vit b12) ..... One by mouth daily    Cyanocobalamin 1000 Mcg/ml Soln (Cyanocobalamin) .Marland KitchenMarland KitchenMarland KitchenMarland Kitchen 1ml monthly  Problem # 4:  POLYMYALGIA RHEUMATICA (ICD-725)  Orders: Venipuncture (98119) TLB-Sedimentation Rate (ESR) (85652-ESR)  Complete Medication List: 1)  Lotensin Hct 10-12.5 Mg Tabs (Benazepril-hydrochlorothiazide) .... Take one by mouth daily 2)  Atenolol 50 Mg Tabs (Atenolol) .... Take 1/2 tablet by mouth daily 3)  Pepcid 20 Mg Tabs (Famotidine) .... Take one tablet by mouth two times a day as needed 4)  Adult Aspirin Ec Low Strength 81 Mg Tbec (Aspirin) .... Take one by mouth daily 5)  Niacin 500 Mg Tabs (Niacin) .Marland Kitchen.. 1 two times a day 6)  Ra Saw Palmetto  80 Mg Caps (Saw palmetto (serenoa repens)) .... Once daily 7)  Miralax Powd (Polyethylene glycol 3350) .... Two times a day 8)  Fish Oil Concentrate 1000 Mg Caps (Omega-3 fatty acids) .... Two by mouth bid 9)  Calcium 500/d 500-400 Mg-unit Chew (Calcium-vitamin d) .... Two times a day 10)  Folbee 2.5-25-1 Mg Tabs (Folic acid-vit b6-vit b12) .... One by mouth daily 11)  Prednisone 5 Mg Tabs (Prednisone) .... 1/2 once daily 12)  Cyanocobalamin 1000 Mcg/ml Soln (Cyanocobalamin) .Marland Kitchen.. 1ml monthly 13)  Vimovo 500-20 Mg Tbec (Naproxen-esomeprazole) .... One by mouth two times a day 14)  Soma 250 Mg Tabs (Carisoprodol) .... 1/2 to one by mouth daily  Hypertension Assessment/Plan:      The patient's hypertensive risk group is category B: At least one risk factor (excluding diabetes) with no target organ damage.  Today's blood pressure is 128/60.  His blood pressure goal is < 140/90.  Patient Instructions: 1)  Please schedule a follow-up appointment in 2 months.

## 2010-04-24 NOTE — Assessment & Plan Note (Signed)
Summary: B-12 INJ/CJR   Nurse Visit   Allergies: 1)  ! * Statins  Medication Administration  Injection # 1:    Medication: Vit B12 1000 mcg    Diagnosis: ANEMIA OF CHRONIC DISEASE (ICD-285.29)    Route: IM    Site: R deltoid    Exp Date: 12/22/2010    Lot #: [id number]    Mfr: American Regent    Patient tolerated injection without complications    Given by: Willy Eddy, LPN (July 21, 2009 12:25 PM)  Orders Added: 1)  Vit B12 1000 mcg [J3420] 2)  Admin of Therapeutic Inj  intramuscular or subcutaneous [[id number]]

## 2010-04-24 NOTE — Progress Notes (Signed)
Summary: Prednisone dosage  Phone Note Call from Patient   Caller: Patient Call For: Stacie Glaze MD Reason for Call: Talk to Nurse Summary of Call: Pt is down to 7.5 mg of prednisone x 6 weeks and wants to know if he needs to taper, stop, or have a blood test? [phone] Initial call taken by: Lynann Beaver CMA,  March 27, 2009 2:14 PM  Follow-up for Phone Call        sedrate this week Follow-up by: Stacie Glaze MD,  March 27, 2009 2:57 PM  Additional Follow-up for Phone Call Additional follow up Details #1::        scheduled for tomorrow Additional Follow-up by: Willy Eddy, LPN,  March 27, 2009 3:13 PM

## 2010-04-24 NOTE — Progress Notes (Signed)
Summary: refill  Phone Note Refill Request Call back at Home Phone [phone] Message from:  Patient---live call  Refills Requested: Medication #1:  ATENOLOL 50 MG  TABS Take 1/2 tablet by mouth daily [facility]  Initial call taken by: Warnell Forester,  December 26, 2009 2:30 PM    Prescriptions: ATENOLOL 50 MG  TABS (ATENOLOL) Take 1/2 tablet by mouth daily  #90 x 3   Entered by:   Willy Eddy, LPN   Authorized by:   Stacie Glaze MD   Signed by:   Willy Eddy, LPN on 34/74/2595   Method used:   Electronically to        [facility] [address] (mail-order)       [facility]       [address]       [place], [place]  [address]       Ph: [phone]       Fax: [phone]   RxID:   [id number]

## 2010-04-24 NOTE — Assessment & Plan Note (Signed)
Summary: B-12 INJ/CJR   Nurse Visit   Allergies: 1)  ! * Statins  Medication Administration  Injection # 1:    Medication: Vit B12 1000 mcg    Diagnosis: ANEMIA OF CHRONIC DISEASE (ICD-285.29)    Route: IM    Site: R deltoid    Exp Date: 01/23/2011    Lot #: [id number]    Mfr: American Regent    Patient tolerated injection without complications    Given by: Willy Eddy, LPN (September 22, 1608 12:24 PM)  Orders Added: 1)  Vit B12 1000 mcg [J3420] 2)  Admin of Therapeutic Inj  intramuscular or subcutaneous [[id number]]

## 2010-04-24 NOTE — Assessment & Plan Note (Signed)
Summary: B12 INJ // RS   Nurse Visit   Allergies: 1)  ! * Statins  Medication Administration  Injection # 1:    Medication: Vit B12 1000 mcg    Diagnosis: ANEMIA OF CHRONIC DISEASE (ICD-285.29)    Route: IM    Site: L deltoid    Exp Date: 12/22/2010    Lot #: [id number]    Mfr: American Regent    Patient tolerated injection without complications    Given by: Willy Eddy, LPN (June 13, 2009 12:21 PM)  Orders Added: 1)  Vit B12 1000 mcg [J3420] 2)  Admin of Therapeutic Inj  intramuscular or subcutaneous [[id number]]

## 2010-04-24 NOTE — Assessment & Plan Note (Signed)
Summary: leg pain/njr   Vital Signs:  Patient profile:   77 year old male Height:      73 inches Weight:      182 pounds BMI:     24.10 Temp:     98.2 degrees F oral Pulse rate:   76 / minute Resp:     14 per minute BP sitting:   136 / 64  (left arm)  Vitals Entered By: Willy Eddy, LPN (June 19, 2009 10:16 AM) CC: roa- c/o rt knee pain primarily knee, Hypertension Management   Primary Care Provider:  Stacie Glaze MD  CC:  roa- c/o rt knee pain primarily knee and Hypertension Management.  History of Present Illness: significant knee pain the hand string tightness has somewhat worsened with the  stretches ( especially pain at base of the spine with the toe touches This pain is worsened with sitting and has resulted in leg weakness the pain in the back is a 4-5 but the pt has noted right leg weakness and the dull pain interfers with sleep   Hypertension History:      He denies headache, chest pain, palpitations, dyspnea with exertion, orthopnea, PND, peripheral edema, visual symptoms, neurologic problems, syncope, and side effects from treatment.        Positive major cardiovascular risk factors include male age 25 years old or older, hyperlipidemia, and hypertension.  Negative major cardiovascular risk factors include negative family history for ischemic heart disease and non-tobacco-user status.     Preventive Screening-Counseling & Management  Alcohol-Tobacco     Smoking Status: never     Passive Smoke Exposure: no  Current Medications (verified): 1)  Lotensin Hct 10-12.5 Mg  Tabs (Benazepril-Hydrochlorothiazide) .... Take One By Mouth Daily 2)  Atenolol 50 Mg  Tabs (Atenolol) .... Take 1/2 Tablet By Mouth Daily 3)  Pepcid 20 Mg  Tabs (Famotidine) .... Take One Tablet By Mouth Two Times A Day As Needed 4)  Adult Aspirin Ec Low Strength 81 Mg  Tbec (Aspirin) .... Take One By Mouth Daily 5)  Niacin 500 Mg Tabs (Niacin) .Marland Kitchen.. 1 Two Times A Day 6)  Ra Saw Palmetto  80 Mg  Caps (Saw Palmetto (Serenoa Repens)) .... Once Daily 7)  Miralax   Powd (Polyethylene Glycol 3350) .... Two Times A Day 8)  Fish Oil Concentrate 1000 Mg  Caps (Omega-3 Fatty Acids) .... Two By Mouth Bid 9)  Calcium 500/d 500-400 Mg-Unit Chew (Calcium-Vitamin D) .... Two Times A Day 10)  Folbee 2.5-25-1 Mg Tabs (Folic Acid-Vit B6-Vit B12) .... One By Mouth Daily 11)  Diclofenac Sodium 75 Mg Tbec (Diclofenac Sodium) .Marland Kitchen.. 1 Two Times A Day As Needed--Hold While On Prednisone 12)  Cytotec 200 Mcg Tabs (Misoprostol) .... One Tablet Twice Daily--Hold While On Prednisone 13)  Prednisone 5 Mg Tabs (Prednisone) .... 1/2 Once Daily 14)  Cyanocobalamin 1000 Mcg/ml Soln (Cyanocobalamin) .Marland Kitchen.. 1ml Monthly  Allergies (verified): 1)  ! * Statins  Past History:  Family History: Last updated: 09/22/2006 Family History of Cardiovascular disorder Fam hx CAD Fam hx MI  Social History: Last updated: 12/04/2007 Retired Married Alcohol use-yes wife--deceased with cancer  Risk Factors: Smoking Status: never (06/19/2009) Passive Smoke Exposure: no (06/19/2009)  Past medical, surgical, family and social histories (including risk factors) reviewed for relevance to current acute and chronic problems.  Past Medical History: Reviewed history from 01/18/2008 and no changes required. GERD Hyperlipidemia Hypertension Osteoarthritis-knees poly myagia rhematica  Past Surgical History: Reviewed history from 10/24/2006 and  no changes required. Colonoscopy-07/30/2005 Tonsillectomy  Family History: Reviewed history from 09/22/2006 and no changes required. Family History of Cardiovascular disorder Fam hx CAD Fam hx MI  Social History: Reviewed history from 12/04/2007 and no changes required. Retired Married Alcohol use-yes wife--deceased with cancer  Review of Systems  The patient denies anorexia, fever, weight loss, weight gain, vision loss, decreased hearing, hoarseness, chest pain,  syncope, dyspnea on exertion, peripheral edema, prolonged cough, headaches, hemoptysis, abdominal pain, melena, hematochezia, severe indigestion/heartburn, hematuria, incontinence, genital sores, muscle weakness, suspicious skin lesions, transient blindness, difficulty walking, depression, unusual weight change, abnormal bleeding, enlarged lymph nodes, angioedema, and breast masses.         back pain  Physical Exam  General:  Well-developed, well-nourished, normal body habitus; no deformities, normal grooming.   Head:  normocephalic and male-pattern balding.   Eyes:  pupils equal, pupils round, pupils reactive to light, and no injection.   Ears:  R ear normal and L ear normal.   Neck:  supple, full ROM, and no masses.   Lungs:  normal respiratory effort and no wheezes.   Heart:  normal rate and regular rhythm.   Abdomen:  soft, non-tender, and normal bowel sounds.   Msk:  lumbar lordosis, SI joint tenderness, and trigger point tenderness.   Extremities:  1+ left pedal edema and 1+ right pedal edema.   Neurologic:  alert & oriented X3 and DTRs symmetrical and normal.     Impression & Recommendations:  Problem # 1:  DEGENERATIVE DISC DISEASE, LUMBOSACRAL SPINE W/RADICULOPATHY (ICD-722.10) discussed the options if the exam show spondyosis or disc dz or vertibral collapse stop the toe touch, MRI to define anatomy due to moterneuron weakness as well as pain  Orders: Radiology Referral (Radiology)  Problem # 2:  POLYMYALGIA RHEUMATICA (ICD-725) monitering Orders: Venipuncture (62952) TLB-Sedimentation Rate (ESR) (85652-ESR)  Problem # 3:  OSTEOARTHRITIS (ICD-715.90)  The following medications were removed from the medication list:    Diclofenac Sodium 75 Mg Tbec (Diclofenac sodium) .Marland Kitchen... 1 two times a day as needed--hold while on prednisone His updated medication list for this problem includes:    Adult Aspirin Ec Low Strength 81 Mg Tbec (Aspirin) .Marland Kitchen... Take one by mouth daily     Vimovo 500-20 Mg Tbec (Naproxen-esomeprazole) ..... One by mouth two times a day  Discussed use of medications, application of heat or cold, and exercises.   Complete Medication List: 1)  Lotensin Hct 10-12.5 Mg Tabs (Benazepril-hydrochlorothiazide) .... Take one by mouth daily 2)  Atenolol 50 Mg Tabs (Atenolol) .... Take 1/2 tablet by mouth daily 3)  Pepcid 20 Mg Tabs (Famotidine) .... Take one tablet by mouth two times a day as needed 4)  Adult Aspirin Ec Low Strength 81 Mg Tbec (Aspirin) .... Take one by mouth daily 5)  Niacin 500 Mg Tabs (Niacin) .Marland Kitchen.. 1 two times a day 6)  Ra Saw Palmetto 80 Mg Caps (Saw palmetto (serenoa repens)) .... Once daily 7)  Miralax Powd (Polyethylene glycol 3350) .... Two times a day 8)  Fish Oil Concentrate 1000 Mg Caps (Omega-3 fatty acids) .... Two by mouth bid 9)  Calcium 500/d 500-400 Mg-unit Chew (Calcium-vitamin d) .... Two times a day 10)  Folbee 2.5-25-1 Mg Tabs (Folic acid-vit b6-vit b12) .... One by mouth daily 11)  Prednisone 5 Mg Tabs (Prednisone) .... 1/2 once daily 12)  Cyanocobalamin 1000 Mcg/ml Soln (Cyanocobalamin) .Marland Kitchen.. 1ml monthly 13)  Vimovo 500-20 Mg Tbec (Naproxen-esomeprazole) .... One by mouth two times a day 14)  Soma 250 Mg Tabs (Carisoprodol) .... 1/2 to one by mouth daily  Hypertension Assessment/Plan:      The patient's hypertensive risk group is category B: At least one risk factor (excluding diabetes) with no target organ damage.  Today's blood pressure is 136/64.  His blood pressure goal is < 140/90.  Patient Instructions: 1)  stop the toe touch, watch the bending keep the two week appointment Prescriptions: VIMOVO 500-20 MG TBEC (NAPROXEN-ESOMEPRAZOLE) one by mouth two times a day  #60 x 0   Entered and Authorized by:   Stacie Glaze MD   Signed by:   Stacie Glaze MD on 06/19/2009   Method used:   Print then Give to Patient   RxID:   [id number]

## 2010-04-24 NOTE — Assessment & Plan Note (Signed)
Summary: b12//ccm   Nurse Visit   Allergies: 1)  ! * Statins  Medication Administration  Injection # 1:    Medication: Vit B12 1000 mcg    Diagnosis: ANEMIA OF CHRONIC DISEASE (ICD-285.29)    Route: IM    Site: R deltoid    Exp Date: 11/23/2010    Lot #: [id number]    Mfr: American Regent    Patient tolerated injection without complications    Given by: Willy Eddy, LPN (April 07, 2009 12:31 PM)  Orders Added: 1)  Vit B12 1000 mcg [J3420] 2)  Admin of Therapeutic Inj  intramuscular or subcutaneous [[id number]]

## 2010-04-24 NOTE — Progress Notes (Signed)
Summary: REQ FOR DOCUMENT TO BE RE-FAXED  Phone Note From Other Clinic   Caller: Patient Caller: Darin [name] w/ [facility] Summary of Call: Darin [name] w/ [facility] called to adv that order that was faxed to them today for pts MRI procedure but fax doesn't say MRI - L spine (what test needs to be performed) anywhere on the referral sheet.... Needs to be faxed again with what procedure needs to be done on it per Darin [name] at [facility].  Initial call taken by: Debbra Riding,  June 19, 2009 3:26 PM  Follow-up for Phone Call        noted Follow-up by: Warnell Forester,  June 20, 2009 8:03 AM

## 2010-04-24 NOTE — Assessment & Plan Note (Signed)
Summary: ROA/FUP/RCD   Vital Signs:  Patient profile:   77 year old male Height:      73 inches Weight:      190 pounds BMI:     25.16 Temp:     98.2 degrees F oral Pulse rate:   68 / minute Resp:     14 per minute BP sitting:   120 / 62  (left arm) Cuff size:   regular  Vitals Entered By: Willy Eddy, LPN (February 12, 2010 10:24 AM) CC: roa, Hypertension Management Is Patient Diabetic? No   Primary Care Provider:  Stacie Glaze MD  CC:  roa and Hypertension Management.  History of Present Illness: joint pain is worse in the right knee and calf mucle is sore and it seem to move to the right nakle he reports swelling in the left ankle has not been exercizing due to an injury to small toe wears his stockings has moderate to severe varicose veins with edema   Hypertension History:      He denies headache, chest pain, palpitations, dyspnea with exertion, orthopnea, PND, peripheral edema, visual symptoms, neurologic problems, syncope, and side effects from treatment.        Positive major cardiovascular risk factors include male age 9 years old or older, hyperlipidemia, and hypertension.  Negative major cardiovascular risk factors include negative family history for ischemic heart disease and non-tobacco-user status.     Preventive Screening-Counseling & Management  Alcohol-Tobacco     Smoking Status: never     Passive Smoke Exposure: no     Tobacco Counseling: not indicated; no tobacco use  Problems Prior to Update: 1)  Varicose Veins Lower Extremities W/inflammation  (ICD-454.1) 2)  Special Screening Malignant Neoplasm of Prostate  (ICD-V76.44) 3)  Preventive Health Care  (ICD-V70.0) 4)  Sec Localized Osteoarthrosis Involving Hand  (ICD-715.24) 5)  Unspec Polyarthropathy/polyarthrit Oth Spec Site  (ICD-716.58) 6)  Dupuytren's Contracture, Right  (ICD-728.6) 7)  Degenerative Disc Disease, Lumbosacral Spine W/radiculopathy  (ICD-722.10) 8)  Anemia of Chronic  Disease  (ICD-285.29) 9)  Carcinoma, Basal Cell  (ICD-173.9) 10)  Bursitis, Right Shoulder  (ICD-726.10) 11)  Pleurisy  (ICD-511.0) 12)  Dry Skin  (ICD-701.1) 13)  Cataract Associated With Other Syndromes  (ICD-366.44) 14)  Cerumen Impaction, Bilateral  (ICD-380.4) 15)  Ing Hernia w/o Mention Obstruction/gangren Bilat  (ICD-550.92) 16)  Cramp in Limb  (ICD-729.82) 17)  Polymyalgia Rheumatica  (ICD-725) 18)  Shoulder Pain, Right  (ICD-719.41) 19)  Back Pain, Acute  (ICD-724.5) 20)  Constipation, Drug Induced  (ICD-564.09) 21)  Depression, Situational, Acute  (ICD-300.4) 22)  Back Pain  (ICD-724.5) 23)  Polyneuropathy Other Diseases Classified Elsw  (ICD-357.4) 24)  Cellulitis and Abscess of Upper Arm and Forearm  (ICD-682.3) 25)  Weight Loss, Abnormal  (ICD-783.21) 26)  Osteoarthritis  (ICD-715.90) 27)  Hypertension  (ICD-401.9) 28)  Hyperlipidemia  (ICD-272.4) 29)  Gerd  (ICD-530.81)  Current Problems (verified): 1)  Special Screening Malignant Neoplasm of Prostate  (ICD-V76.44) 2)  Preventive Health Care  (ICD-V70.0) 3)  Sec Localized Osteoarthrosis Involving Hand  (ONG-295.28) 4)  Unspec Polyarthropathy/polyarthrit Oth Spec Site  (ICD-716.58) 5)  Dupuytren's Contracture, Right  (ICD-728.6) 6)  Degenerative Disc Disease, Lumbosacral Spine W/radiculopathy  (ICD-722.10) 7)  Anemia of Chronic Disease  (ICD-285.29) 8)  Carcinoma, Basal Cell  (ICD-173.9) 9)  Bursitis, Right Shoulder  (ICD-726.10) 10)  Pleurisy  (ICD-511.0) 11)  Dry Skin  (ICD-701.1) 12)  Cataract Associated With Other Syndromes  (ICD-366.44) 13)  Cerumen  Impaction, Bilateral  (ICD-380.4) 14)  Ing Hernia w/o Mention Obstruction/gangren Bilat  (ICD-550.92) 15)  Cramp in Limb  (ICD-729.82) 16)  Polymyalgia Rheumatica  (ICD-725) 17)  Shoulder Pain, Right  (ICD-719.41) 18)  Back Pain, Acute  (ICD-724.5) 19)  Constipation, Drug Induced  (ICD-564.09) 20)  Depression, Situational, Acute  (ICD-300.4) 21)  Back Pain   (ICD-724.5) 22)  Polyneuropathy Other Diseases Classified Elsw  (ICD-357.4) 23)  Cellulitis and Abscess of Upper Arm and Forearm  (ICD-682.3) 24)  Weight Loss, Abnormal  (ICD-783.21) 25)  Osteoarthritis  (ICD-715.90) 26)  Hypertension  (ICD-401.9) 27)  Hyperlipidemia  (ICD-272.4) 28)  Gerd  (ICD-530.81)  Medications Prior to Update: 1)  Lotensin Hct 10-12.5 Mg  Tabs (Benazepril-Hydrochlorothiazide) .... Take One By Mouth Daily 2)  Atenolol 50 Mg  Tabs (Atenolol) .... Take 1/2 Tablet By Mouth Daily 3)  Pepcid 20 Mg  Tabs (Famotidine) .... Take One Tablet By Mouth Two Times A Day As Needed 4)  Adult Aspirin Ec Low Strength 81 Mg  Tbec (Aspirin) .... Take One By Mouth Daily 5)  Niacin 500 Mg Tabs (Niacin) .Marland Kitchen.. 1 Two Times A Day 6)  Ra Saw Palmetto 80 Mg  Caps (Saw Palmetto (Serenoa Repens)) .... Once Daily 7)  Miralax   Powd (Polyethylene Glycol 3350) .... Two Times A Day 8)  Fish Oil Concentrate 1000 Mg  Caps (Omega-3 Fatty Acids) .... Two By Mouth Bid 9)  Calcium 500/d 500-400 Mg-Unit Chew (Calcium-Vitamin D) .... Two Times A Day 10)  Folbee 2.5-25-1 Mg Tabs (Folic Acid-Vit B6-Vit B12) .... One By Mouth Daily 11)  Cyanocobalamin 1000 Mcg/ml Soln (Cyanocobalamin) .Marland Kitchen.. 1ml Monthly 12)  Levitra 10 Mg Tabs (Vardenafil Hcl) .Marland Kitchen.. 1 Once Daily As Needed 13)  Meloxicam 7.5 Mg Tabs (Meloxicam) .... One By Mouth Daily  Current Medications (verified): 1)  Lotensin Hct 10-12.5 Mg  Tabs (Benazepril-Hydrochlorothiazide) .... Take One By Mouth Daily 2)  Atenolol 50 Mg  Tabs (Atenolol) .... Take 1/2 Tablet By Mouth Daily 3)  Pepcid 20 Mg  Tabs (Famotidine) .... Take One Tablet By Mouth Two Times A Day As Needed 4)  Adult Aspirin Ec Low Strength 81 Mg  Tbec (Aspirin) .... Take One By Mouth Daily 5)  Niacin 500 Mg Tabs (Niacin) .Marland Kitchen.. 1 Two Times A Day 6)  Ra Saw Palmetto 80 Mg  Caps (Saw Palmetto (Serenoa Repens)) .... Once Daily 7)  Miralax   Powd (Polyethylene Glycol 3350) .... Two Times A Day 8)  Fish  Oil Concentrate 1000 Mg  Caps (Omega-3 Fatty Acids) .... Two By Mouth Bid 9)  Calcium 500/d 500-400 Mg-Unit Chew (Calcium-Vitamin D) .... Two Times A Day 10)  Folbee 2.5-25-1 Mg Tabs (Folic Acid-Vit B6-Vit B12) .... One By Mouth Daily 11)  Cyanocobalamin 1000 Mcg/ml Soln (Cyanocobalamin) .Marland Kitchen.. 1ml Monthly 12)  Levitra 10 Mg Tabs (Vardenafil Hcl) .Marland Kitchen.. 1 Once Daily As Needed 13)  Meloxicam 7.5 Mg Tabs (Meloxicam) .... One By Mouth Daily  Allergies (verified): 1)  ! * Statins  Past History:  Family History: Last updated: 09/22/2006 Family History of Cardiovascular disorder Fam hx CAD Fam hx MI  Social History: Last updated: 12/04/2007 Retired Married Alcohol use-yes wife--deceased with cancer  Risk Factors: Exercise: yes (12/12/2009)  Risk Factors: Smoking Status: never (02/12/2010) Passive Smoke Exposure: no (02/12/2010)  Past medical, surgical, family and social histories (including risk factors) reviewed, and no changes noted (except as noted below).  Past Medical History: Reviewed history from 01/18/2008 and no changes required. 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 Colonoscopy-07/30/2005 Tonsillectomy  Family History: Reviewed history from 09/22/2006 and no changes required. Family History of Cardiovascular disorder Fam hx CAD Fam hx MI  Social History: Reviewed history from 12/04/2007 and no changes required. Retired Married Alcohol use-yes wife--deceased with cancer  Review of Systems  The patient denies anorexia, fever, weight loss, weight gain, vision loss, decreased hearing, hoarseness, chest pain, syncope, dyspnea on exertion, peripheral edema, prolonged cough, headaches, hemoptysis, abdominal pain, melena, hematochezia, severe indigestion/heartburn, hematuria, incontinence, genital sores, muscle weakness, suspicious skin lesions,  transient blindness, difficulty walking, depression, unusual weight change, abnormal bleeding, enlarged lymph nodes, angioedema, and breast masses.    Physical Exam  General:  alert and well-developed.   Head:  normocephalic and male-pattern balding.   Eyes:  pupils equal, pupils round, pupils reactive to light, and no injection.   Ears:  R ear normal and L ear normal.   Nose:  no external deformity.   Mouth:  good dentition and pharynx pink and moist.   Neck:  No deformities, masses, or tenderness noted. Lungs:  Normal respiratory effort, chest expands symmetrically. Lungs are clear to auscultation, no crackles or wheezes. Abdomen:  soft, non-tender, and no hepatomegaly.   Msk:  contracture Extremities:  trace left pedal edema and trace right pedal edema.  increased varicosities   Neurologic:  alert & oriented X3 and finger-to-nose normal.     Impression & Recommendations:  Problem # 1:  VARICOSE VEINS LOWER EXTREMITIES W/INFLAMMATION (ICD-454.1) Assessment Deteriorated  Orders: Durable Medical Equipment (DME) Radiology Referral (Radiology)  Problem # 2:  UNSPEC POLYARTHROPATHY/POLYARTHRIT OTH SPEC SITE [phone]) Assessment: Unchanged  Orders: Venipuncture (10932) Specimen Handling (35573) TLB-CRP-High Sensitivity (C-Reactive Protein) (86140-FCRP) TLB-Sedimentation Rate (ESR) (85652-ESR)  Problem # 3:  HYPERTENSION (ICD-401.9)  His updated medication list for this problem includes:    Lotensin Hct 10-12.5 Mg Tabs (Benazepril-hydrochlorothiazide) .Marland Kitchen... Take one by mouth daily    Atenolol 50 Mg Tabs (Atenolol) .Marland Kitchen... Take 1/2 tablet by mouth daily  BP today: 120/62 Prior BP: 120/70 (12/12/2009)  Prior 10 Yr Risk Heart Disease: Not enough information (12/11/2006)  Labs Reviewed: K+: 4.7 (12/12/2009) Creat: : 0.7 (12/12/2009)   Chol: 249 (12/12/2009)   HDL: 86.50 (12/12/2009)   LDL: DEL (06/16/2007)   TG: 69.0 (12/12/2009)  Complete Medication List: 1)  Lotensin  Hct 10-12.5 Mg Tabs (Benazepril-hydrochlorothiazide) .... Take one by mouth daily 2)  Atenolol 50 Mg Tabs (Atenolol) .... Take 1/2 tablet by mouth daily 3)  Pepcid 20 Mg Tabs (Famotidine) .... Take one tablet by mouth two times a day as needed 4)  Adult Aspirin Ec Low Strength 81 Mg Tbec (Aspirin) .... Take one by mouth daily 5)  Niacin 500 Mg Tabs (Niacin) .Marland Kitchen.. 1 two times a day 6)  Ra Saw Palmetto 80 Mg Caps (Saw palmetto (serenoa repens)) .... Once daily 7)  Miralax Powd (Polyethylene glycol 3350) .... Two times a day 8)  Fish Oil Concentrate 1000 Mg Caps (Omega-3 fatty acids) .... Two by mouth bid 9)  Calcium 500/d 500-400 Mg-unit Chew (Calcium-vitamin d) .... Two times a day 10)  Folbee 2.5-25-1 Mg Tabs (Folic acid-vit b6-vit b12) .... One by mouth daily 11)  Cyanocobalamin 1000 Mcg/ml Soln (Cyanocobalamin) .Marland Kitchen.. 1ml monthly 12)  Levitra 10 Mg Tabs (Vardenafil hcl) .Marland Kitchen.. 1 once daily as needed 13)  Meloxicam 7.5 Mg Tabs (Meloxicam) .... One by mouth daily  Other Orders: Vit B12 1000 mcg (J3420)  Admin of Therapeutic Inj  intramuscular or subcutaneous (16109)  Hypertension Assessment/Plan:      The patient's hypertensive risk group is category B: At least one risk factor (excluding diabetes) with no target organ damage.  Today's blood pressure is 120/62.  His blood pressure goal is < 140/90.  Patient Instructions: 1)  Please schedule a follow-up appointment in 4 months. 2)  Take calcium +Vitamin D daily.   Medication Administration  Injection # 1:    Medication: Vit B12 1000 mcg    Diagnosis: ANEMIA OF CHRONIC DISEASE (ICD-285.29)    Site: L deltoid    Exp Date: 12/21/2010    Lot #: [id number]    Mfr: American Regent    Patient tolerated injection without complications    Given by: Willy Eddy, LPN (February 12, 2010 10:26 AM)  Orders Added: 1)  Vit B12 1000 mcg [J3420] 2)  Admin of Therapeutic Inj  intramuscular or subcutaneous [96372] 3)  Est. Patient Level IV [40981] 4)   Durable Medical Equipment [DME] 5)  Venipuncture [19147] 6)  Specimen Handling [99000] 7)  Radiology Referral [Radiology] 8)  TLB-CRP-High Sensitivity (C-Reactive Protein) [86140-FCRP] 9)  TLB-Sedimentation Rate (ESR) [85652-ESR]

## 2010-04-24 NOTE — Progress Notes (Signed)
  Phone Note Call from Patient   Summary of Call: pt calling with update from hand injections- the ends of the fingers have no pain, but the hands are still painful and swollen with numbness in thumbs.please advise .  He states he is not a happy camper Initial call taken by: Willy Eddy, LPN,  November 03, 2009 9:18 AM  Follow-up for Phone Call        per dr Norm Parcel dr sypther - pt informed Follow-up by: Willy Eddy, LPN,  November 03, 2009 9:54 AM

## 2010-04-24 NOTE — Progress Notes (Signed)
Summary: refill  Phone Note Refill Request Call back at Home Phone [phone] Message from:  Patient----voice mail  Refills Requested: Medication #1:  FOLBEE 2.5-25-1 MG TABS one by mouth daily Send to [facility]  Initial call taken by: Warnell Forester,  January 08, 2010 11:16 AM    Prescriptions: FOLBEE 2.5-25-1 MG TABS (FOLIC ACID-VIT B6-VIT B12) one by mouth daily  #90 x 3   Entered by:   Willy Eddy, LPN   Authorized by:   Stacie Glaze MD   Signed by:   Willy Eddy, LPN on 59/56/3875   Method used:   Electronically to        [facility] [address] (mail-order)       [facility]       [address]       [place], [place]  [address]       Ph: [phone]       Fax: [phone]   RxID:   [id number]

## 2010-04-24 NOTE — Progress Notes (Signed)
       New/Updated Medications: PREDNISONE 1 MG TABS (PREDNISONE) 1 once daily for 14 days and then stop Prescriptions: PREDNISONE 1 MG TABS (PREDNISONE) 1 once daily for 14 days and then stop  #15 x 0   Entered by:   Willy Eddy, LPN   Authorized by:   Stacie Glaze MD   Signed by:   Willy Eddy, LPN on 16/12/9602   Method used:   Electronically to        [facility]* (retail)       [address]       [place], [place]  [id number]       Ph: [phone]       Fax: [phone]   RxID:   [id number]

## 2010-04-24 NOTE — Miscellaneous (Signed)
Summary: Orders Update  Clinical Lists Changes  Orders: Added new Test order of Abdominal Aorta Duplex (Abd Aorta Duplex) - Signed 

## 2010-04-24 NOTE — Progress Notes (Signed)
Summary: Personalized Plan for Preventive Services  Personalized Plan for Preventive Services   Imported By: Maryln Gottron 12/15/2009 15:50:42  _____________________________________________________________________  External Attachment:    Type:   Image     Comment:   External Document

## 2010-04-24 NOTE — Assessment & Plan Note (Signed)
Summary: 2 wk rov/njr 415pm   Vital Signs:  Patient profile:   77 year old male Height:      73 inches Weight:      184 pounds BMI:     24.36 Temp:     98.2 degrees F oral Pulse rate:   68 / minute Resp:     14 per minute BP sitting:   130 / 70  (left arm)  Vitals Entered By: Willy Eddy, LPN (October 30, 2009 4:48 PM) CC: roa- saw dr Anne Hahn on friday, Hypertension Management Is Patient Diabetic? No   Primary Care Iriana Artley:  Stacie Glaze MD  CC:  roa- saw dr Anne Hahn on friday and Hypertension Management.  History of Present Illness: JOINT PAIN pain is hands  without difuse myalgia  Hypertension History:      He denies headache, chest pain, palpitations, dyspnea with exertion, orthopnea, PND, peripheral edema, visual symptoms, neurologic problems, syncope, and side effects from treatment.        Positive major cardiovascular risk factors include male age 69 years old or older, hyperlipidemia, and hypertension.  Negative major cardiovascular risk factors include negative family history for ischemic heart disease and non-tobacco-user status.     Preventive Screening-Counseling & Management  Alcohol-Tobacco     Smoking Status: never     Passive Smoke Exposure: no  Problems Prior to Update: 1)  Sec Localized Osteoarthrosis Involving Hand  (ICD-715.24) 2)  Unspec Polyarthropathy/polyarthrit Oth Spec Site  (ICD-716.58) 3)  Dupuytren's Contracture, Right  (ICD-728.6) 4)  Degenerative Disc Disease, Lumbosacral Spine W/radiculopathy  (ICD-722.10) 5)  Anemia of Chronic Disease  (ICD-285.29) 6)  Carcinoma, Basal Cell  (ICD-173.9) 7)  Bursitis, Right Shoulder  (ICD-726.10) 8)  Pleurisy  (ICD-511.0) 9)  Dry Skin  (ICD-701.1) 10)  Cataract Associated With Other Syndromes  (ICD-366.44) 11)  Cerumen Impaction, Bilateral  (ICD-380.4) 12)  Ing Hernia w/o Mention Obstruction/gangren Bilat  (ICD-550.92) 13)  Cramp in Limb  (ICD-729.82) 14)  Polymyalgia Rheumatica  (ICD-725) 15)   Shoulder Pain, Right  (ICD-719.41) 16)  Back Pain, Acute  (ICD-724.5) 17)  Constipation, Drug Induced  (ICD-564.09) 18)  Depression, Situational, Acute  (ICD-300.4) 19)  Back Pain  (ICD-724.5) 20)  Polyneuropathy Other Diseases Classified Elsw  (ICD-357.4) 21)  Cellulitis and Abscess of Upper Arm and Forearm  (ICD-682.3) 22)  Weight Loss, Abnormal  (ICD-783.21) 23)  Osteoarthritis  (ICD-715.90) 24)  Hypertension  (ICD-401.9) 25)  Hyperlipidemia  (ICD-272.4) 26)  Gerd  (ICD-530.81)  Current Problems (verified): 1)  Sec Localized Osteoarthrosis Involving Hand  (ICD-715.24) 2)  Unspec Polyarthropathy/polyarthrit Oth Spec Site  (ICD-716.58) 3)  Dupuytren's Contracture, Right  (ICD-728.6) 4)  Degenerative Disc Disease, Lumbosacral Spine W/radiculopathy  (ICD-722.10) 5)  Anemia of Chronic Disease  (ICD-285.29) 6)  Carcinoma, Basal Cell  (ICD-173.9) 7)  Bursitis, Right Shoulder  (ICD-726.10) 8)  Pleurisy  (ICD-511.0) 9)  Dry Skin  (ICD-701.1) 10)  Cataract Associated With Other Syndromes  (ICD-366.44) 11)  Cerumen Impaction, Bilateral  (ICD-380.4) 12)  Ing Hernia w/o Mention Obstruction/gangren Bilat  (ICD-550.92) 13)  Cramp in Limb  (ICD-729.82) 14)  Polymyalgia Rheumatica  (ICD-725) 15)  Shoulder Pain, Right  (ICD-719.41) 16)  Back Pain, Acute  (ICD-724.5) 17)  Constipation, Drug Induced  (ICD-564.09) 18)  Depression, Situational, Acute  (ICD-300.4) 19)  Back Pain  (ICD-724.5) 20)  Polyneuropathy Other Diseases Classified Elsw  (ICD-357.4) 21)  Cellulitis and Abscess of Upper Arm and Forearm  (ICD-682.3) 22)  Weight Loss, Abnormal  (  ZOX-096.04) 23)  Osteoarthritis  (ICD-715.90) 24)  Hypertension  (ICD-401.9) 25)  Hyperlipidemia  (ICD-272.4) 26)  Gerd  (ICD-530.81)  Medications Prior to Update: 1)  Lotensin Hct 10-12.5 Mg  Tabs (Benazepril-Hydrochlorothiazide) .... Take One By Mouth Daily 2)  Atenolol 50 Mg  Tabs (Atenolol) .... Take 1/2 Tablet By Mouth Daily 3)  Pepcid 20 Mg   Tabs (Famotidine) .... Take One Tablet By Mouth Two Times A Day As Needed 4)  Adult Aspirin Ec Low Strength 81 Mg  Tbec (Aspirin) .... Take One By Mouth Daily 5)  Niacin 500 Mg Tabs (Niacin) .Marland Kitchen.. 1 Two Times A Day 6)  Ra Saw Palmetto 80 Mg  Caps (Saw Palmetto (Serenoa Repens)) .... Once Daily 7)  Miralax   Powd (Polyethylene Glycol 3350) .... Two Times A Day 8)  Fish Oil Concentrate 1000 Mg  Caps (Omega-3 Fatty Acids) .... Two By Mouth Bid 9)  Calcium 500/d 500-400 Mg-Unit Chew (Calcium-Vitamin D) .... Two Times A Day 10)  Folbee 2.5-25-1 Mg Tabs (Folic Acid-Vit B6-Vit B12) .... One By Mouth Daily 11)  Cyanocobalamin 1000 Mcg/ml Soln (Cyanocobalamin) .Marland Kitchen.. 1ml Monthly 12)  Naprosyn 500 Mg Tabs (Naproxen) .... 2 Once Daily With Prilosec 13)  Levitra 10 Mg Tabs (Vardenafil Hcl) .Marland Kitchen.. 1 Once Daily As Needed  Current Medications (verified): 1)  Lotensin Hct 10-12.5 Mg  Tabs (Benazepril-Hydrochlorothiazide) .... Take One By Mouth Daily 2)  Atenolol 50 Mg  Tabs (Atenolol) .... Take 1/2 Tablet By Mouth Daily 3)  Pepcid 20 Mg  Tabs (Famotidine) .... Take One Tablet By Mouth Two Times A Day As Needed 4)  Adult Aspirin Ec Low Strength 81 Mg  Tbec (Aspirin) .... Take One By Mouth Daily 5)  Niacin 500 Mg Tabs (Niacin) .Marland Kitchen.. 1 Two Times A Day 6)  Ra Saw Palmetto 80 Mg  Caps (Saw Palmetto (Serenoa Repens)) .... Once Daily 7)  Miralax   Powd (Polyethylene Glycol 3350) .... Two Times A Day 8)  Fish Oil Concentrate 1000 Mg  Caps (Omega-3 Fatty Acids) .... Two By Mouth Bid 9)  Calcium 500/d 500-400 Mg-Unit Chew (Calcium-Vitamin D) .... Two Times A Day 10)  Folbee 2.5-25-1 Mg Tabs (Folic Acid-Vit B6-Vit B12) .... One By Mouth Daily 11)  Cyanocobalamin 1000 Mcg/ml Soln (Cyanocobalamin) .Marland Kitchen.. 1ml Monthly 12)  Naprosyn 500 Mg Tabs (Naproxen) .... 2 Once Daily With Prilosec 13)  Levitra 10 Mg Tabs (Vardenafil Hcl) .Marland Kitchen.. 1 Once Daily As Needed  Allergies (verified): 1)  ! * Statins  Past History:  Family  History: Last updated: 09/22/2006 Family History of Cardiovascular disorder Fam hx CAD Fam hx MI  Social History: Last updated: 12/04/2007 Retired Married Alcohol use-yes wife--deceased with cancer  Risk Factors: Smoking Status: never (10/30/2009) Passive Smoke Exposure: no (10/30/2009)  Past medical, surgical, family and social histories (including risk factors) reviewed, and no changes noted (except as noted below).  Past Medical History: Reviewed history from 01/18/2008 and no changes required. GERD Hyperlipidemia Hypertension Osteoarthritis-knees poly myagia rhematica  Past Surgical History: Reviewed history from 10/24/2006 and no changes required. Colonoscopy-07/30/2005 Tonsillectomy  Family History: Reviewed history from 09/22/2006 and no changes required. Family History of Cardiovascular disorder Fam hx CAD Fam hx MI  Social History: Reviewed history from 12/04/2007 and no changes required. Retired Married Alcohol use-yes wife--deceased with cancer  Review of Systems  The patient denies anorexia, fever, weight loss, weight gain, vision loss, decreased hearing, hoarseness, chest pain, syncope, dyspnea on exertion, peripheral edema, prolonged cough, headaches, hemoptysis, abdominal pain, melena,  hematochezia, severe indigestion/heartburn, hematuria, incontinence, genital sores, muscle weakness, suspicious skin lesions, transient blindness, difficulty walking, depression, unusual weight change, abnormal bleeding, enlarged lymph nodes, angioedema, breast masses, and testicular masses.    Physical Exam  General:  Well-developed, well-nourished, normal body habitus; no deformities, normal grooming.   Head:  normocephalic and male-pattern balding.   Eyes:  pupils equal, pupils round, pupils reactive to light, and no injection.   Ears:  R ear normal and L ear normal.   Nose:  External nasal examination shows no deformity or inflammation. Nasal mucosa are pink and  moist without lesions or exudates. Neck:  supple, full ROM, and no masses.   Lungs:  normal respiratory effort and no wheezes.   Heart:  normal rate and regular rhythm.   Msk:  no redness over joints, no joint deformities, decreased ROM, joint tenderness, and joint warmth.   Pulses:  R and L carotid,radial,femoral,dorsalis pedis and posterior tibial pulses are full and equal bilaterally Extremities:  trace left pedal edema and trace right pedal edema.     Impression & Recommendations:  Problem # 1:  SEC LOCALIZED OSTEOARTHROSIS INVOLVING HAND (ZOX-096.04) moniter labs, consider referral to rheunatology His updated medication list for this problem includes:    Adult Aspirin Ec Low Strength 81 Mg Tbec (Aspirin) .Marland Kitchen... Take one by mouth daily    Naprosyn 500 Mg Tabs (Naproxen) .Marland Kitchen... 2 once daily with prilosec  Discussed use of medications, application of heat or cold, and exercises.   Complete Medication List: 1)  Lotensin Hct 10-12.5 Mg Tabs (Benazepril-hydrochlorothiazide) .... Take one by mouth daily 2)  Atenolol 50 Mg Tabs (Atenolol) .... Take 1/2 tablet by mouth daily 3)  Pepcid 20 Mg Tabs (Famotidine) .... Take one tablet by mouth two times a day as needed 4)  Adult Aspirin Ec Low Strength 81 Mg Tbec (Aspirin) .... Take one by mouth daily 5)  Niacin 500 Mg Tabs (Niacin) .Marland Kitchen.. 1 two times a day 6)  Ra Saw Palmetto 80 Mg Caps (Saw palmetto (serenoa repens)) .... Once daily 7)  Miralax Powd (Polyethylene glycol 3350) .... Two times a day 8)  Fish Oil Concentrate 1000 Mg Caps (Omega-3 fatty acids) .... Two by mouth bid 9)  Calcium 500/d 500-400 Mg-unit Chew (Calcium-vitamin d) .... Two times a day 10)  Folbee 2.5-25-1 Mg Tabs (Folic acid-vit b6-vit b12) .... One by mouth daily 11)  Cyanocobalamin 1000 Mcg/ml Soln (Cyanocobalamin) .Marland Kitchen.. 1ml monthly 12)  Naprosyn 500 Mg Tabs (Naproxen) .... 2 once daily with prilosec 13)  Levitra 10 Mg Tabs (Vardenafil hcl) .Marland Kitchen.. 1 once daily as  needed  Other Orders: Vit B12 1000 mcg (J3420) Admin of Therapeutic Inj  intramuscular or subcutaneous (54098)  Hypertension Assessment/Plan:      The patient's hypertensive risk group is category B: At least one risk factor (excluding diabetes) with no target organ damage.  Today's blood pressure is 130/70.  His blood pressure goal is < 140/90.   Medication Administration  Injection # 1:    Medication: Vit B12 1000 mcg    Diagnosis: ANEMIA OF CHRONIC DISEASE (ICD-285.29)    Route: IM    Site: R deltoid    Exp Date: 12/21/2010    Lot #: [id number]    Mfr: American Regent    Patient tolerated injection without complications    Given by: Willy Eddy, LPN (October 30, 2009 5:08 PM)  Orders Added: 1)  Vit B12 1000 mcg [J3420] 2)  Admin of Therapeutic Inj  intramuscular or subcutaneous [96372] 3)  Est. Patient Level III [04540]

## 2010-04-24 NOTE — Assessment & Plan Note (Signed)
Summary: 3 month fup//ccm   Vital Signs:  Patient profile:   77 year old male Height:      73 inches Weight:      172 pounds BMI:     22.77 Temp:     98.2 degrees F oral Pulse rate:   68 / minute Resp:     14 per minute BP sitting:   130 / 60  (left arm)  Vitals Entered By: Willy Eddy, LPN (May 08, 2009 9:21 AM) CC: roa- wants to titrate down off of prednisone   Primary Care Provider:  Stacie Glaze MD  CC:  roa- wants to titrate down off of prednisone.  History of Present Illness: The pt reports improved muscle pain having increased pain in the right leg associated with the move and lifting pain is behind the right knee , no right hip pain this pain waxes and wains  Blood pressure and GERN stable anemia to be moinitered today   Preventive Screening-Counseling & Management  Alcohol-Tobacco     Smoking Status: never     Passive Smoke Exposure: no  Problems Prior to Update: 1)  Anemia of Chronic Disease  (ICD-285.29) 2)  Carcinoma, Basal Cell  (ICD-173.9) 3)  Bursitis, Right Shoulder  (ICD-726.10) 4)  Pleurisy  (ICD-511.0) 5)  Dry Skin  (ICD-701.1) 6)  Cataract Associated With Other Syndromes  (ICD-366.44) 7)  Cerumen Impaction, Bilateral  (ICD-380.4) 8)  Ing Hernia w/o Mention Obstruction/gangren Bilat  (ICD-550.92) 9)  Cramp in Limb  (ICD-729.82) 10)  Polymyalgia Rheumatica  (ICD-725) 11)  Shoulder Pain, Right  (ICD-719.41) 12)  Back Pain, Acute  (ICD-724.5) 13)  Constipation, Drug Induced  (ICD-564.09) 14)  Depression, Situational, Acute  (ICD-300.4) 15)  Back Pain  (ICD-724.5) 16)  Polyneuropathy Other Diseases Classified Elsw  (ICD-357.4) 17)  Cellulitis and Abscess of Upper Arm and Forearm  (ICD-682.3) 18)  Weight Loss, Abnormal  (ICD-783.21) 19)  Osteoarthritis  (ICD-715.90) 20)  Hypertension  (ICD-401.9) 21)  Hyperlipidemia  (ICD-272.4) 22)  Gerd  (ICD-530.81)  Current Problems (verified): 1)  Anemia of Chronic Disease   (ICD-285.29) 2)  Carcinoma, Basal Cell  (ICD-173.9) 3)  Bursitis, Right Shoulder  (ICD-726.10) 4)  Pleurisy  (ICD-511.0) 5)  Dry Skin  (ICD-701.1) 6)  Cataract Associated With Other Syndromes  (ICD-366.44) 7)  Cerumen Impaction, Bilateral  (ICD-380.4) 8)  Ing Hernia w/o Mention Obstruction/gangren Bilat  (ICD-550.92) 9)  Cramp in Limb  (ICD-729.82) 10)  Polymyalgia Rheumatica  (ICD-725) 11)  Shoulder Pain, Right  (ICD-719.41) 12)  Back Pain, Acute  (ICD-724.5) 13)  Constipation, Drug Induced  (ICD-564.09) 14)  Depression, Situational, Acute  (ICD-300.4) 15)  Back Pain  (ICD-724.5) 16)  Polyneuropathy Other Diseases Classified Elsw  (ICD-357.4) 17)  Cellulitis and Abscess of Upper Arm and Forearm  (ICD-682.3) 18)  Weight Loss, Abnormal  (ICD-783.21) 19)  Osteoarthritis  (ICD-715.90) 20)  Hypertension  (ICD-401.9) 21)  Hyperlipidemia  (ICD-272.4) 22)  Gerd  (ICD-530.81)  Medications Prior to Update: 1)  Lotensin Hct 10-12.5 Mg  Tabs (Benazepril-Hydrochlorothiazide) .... Take One By Mouth Daily 2)  Atenolol 50 Mg  Tabs (Atenolol) .... Take 1/2 Tablet By Mouth Daily 3)  Pepcid 20 Mg  Tabs (Famotidine) .... Take One Tablet By Mouth Two Times A Day As Needed 4)  Adult Aspirin Ec Low Strength 81 Mg  Tbec (Aspirin) .... Take One By Mouth Daily 5)  Niacin 500 Mg Tabs (Niacin) .Marland Kitchen.. 1 Two Times A Day 6)  Ra Saw Palmetto 80  Mg  Caps (Saw Palmetto (Serenoa Repens)) .... Once Daily 7)  Miralax   Powd (Polyethylene Glycol 3350) .... Two Times A Day 8)  Fish Oil Concentrate 1000 Mg  Caps (Omega-3 Fatty Acids) .... Two By Mouth Bid 9)  Calcium 500/d 500-400 Mg-Unit Chew (Calcium-Vitamin D) .... Two Times A Day 10)  Folbee 2.5-25-1 Mg Tabs (Folic Acid-Vit B6-Vit B12) .... One By Mouth Daily 11)  Diclofenac Sodium 75 Mg Tbec (Diclofenac Sodium) .Marland Kitchen.. 1 Two Times A Day As Needed--Hold While On Prednisone 12)  Cytotec 200 Mcg Tabs (Misoprostol) .... One Tablet Twice Daily--Hold While On Prednisone 13)   Prednisone 5 Mg Tabs (Prednisone) .Marland Kitchen.. 1 Once Daily  Current Medications (verified): 1)  Lotensin Hct 10-12.5 Mg  Tabs (Benazepril-Hydrochlorothiazide) .... Take One By Mouth Daily 2)  Atenolol 50 Mg  Tabs (Atenolol) .... Take 1/2 Tablet By Mouth Daily 3)  Pepcid 20 Mg  Tabs (Famotidine) .... Take One Tablet By Mouth Two Times A Day As Needed 4)  Adult Aspirin Ec Low Strength 81 Mg  Tbec (Aspirin) .... Take One By Mouth Daily 5)  Niacin 500 Mg Tabs (Niacin) .Marland Kitchen.. 1 Two Times A Day 6)  Ra Saw Palmetto 80 Mg  Caps (Saw Palmetto (Serenoa Repens)) .... Once Daily 7)  Miralax   Powd (Polyethylene Glycol 3350) .... Two Times A Day 8)  Fish Oil Concentrate 1000 Mg  Caps (Omega-3 Fatty Acids) .... Two By Mouth Bid 9)  Calcium 500/d 500-400 Mg-Unit Chew (Calcium-Vitamin D) .... Two Times A Day 10)  Folbee 2.5-25-1 Mg Tabs (Folic Acid-Vit B6-Vit B12) .... One By Mouth Daily 11)  Diclofenac Sodium 75 Mg Tbec (Diclofenac Sodium) .Marland Kitchen.. 1 Two Times A Day As Needed--Hold While On Prednisone 12)  Cytotec 200 Mcg Tabs (Misoprostol) .... One Tablet Twice Daily--Hold While On Prednisone 13)  Prednisone 5 Mg Tabs (Prednisone) .Marland Kitchen.. 1 Once Daily 14)  Cyanocobalamin 1000 Mcg/ml Soln (Cyanocobalamin) .Marland Kitchen.. 1ml Monthly  Allergies (verified): 1)  ! * Statins  Past History:  Family History: Last updated: 09/22/2006 Family History of Cardiovascular disorder Fam hx CAD Fam hx MI  Social History: Last updated: 12/04/2007 Retired Married Alcohol use-yes wife--deceased with cancer  Risk Factors: Smoking Status: never (05/08/2009) Passive Smoke Exposure: no (05/08/2009)  Past medical, surgical, family and social histories (including risk factors) reviewed, and no changes noted (except as noted below).  Past Medical History: Reviewed history from 01/18/2008 and no changes required. GERD Hyperlipidemia Hypertension Osteoarthritis-knees poly myagia rhematica  Past Surgical History: Reviewed history from  10/24/2006 and no changes required. Colonoscopy-07/30/2005 Tonsillectomy  Family History: Reviewed history from 09/22/2006 and no changes required. Family History of Cardiovascular disorder Fam hx CAD Fam hx MI  Social History: Reviewed history from 12/04/2007 and no changes required. Retired Married Alcohol use-yes wife--deceased with cancer  Review of Systems  The patient denies anorexia, fever, weight loss, weight gain, vision loss, decreased hearing, hoarseness, chest pain, syncope, dyspnea on exertion, peripheral edema, prolonged cough, headaches, hemoptysis, abdominal pain, melena, hematochezia, severe indigestion/heartburn, hematuria, incontinence, genital sores, muscle weakness, suspicious skin lesions, transient blindness, difficulty walking, depression, unusual weight change, abnormal bleeding, enlarged lymph nodes, angioedema, and breast masses.    Physical Exam  General:  Well-developed, well-nourished, normal body habitus; no deformities, normal grooming.   Head:  normocephalic and male-pattern balding.   Eyes:  pupils equal, pupils round, pupils reactive to light, and no injection.   Ears:  R ear normal and L ear normal.   Nose:  External  nasal examination shows no deformity or inflammation. Nasal mucosa are pink and moist without lesions or exudates. Neck:  supple, full ROM, and no masses.   Lungs:  normal respiratory effort and no wheezes.   Heart:  normal rate and regular rhythm.   Abdomen:  soft, non-tender, and normal bowel sounds.   Msk:  joint tenderness and joint swelling.   Pulses:  R and L carotid,radial,femoral,dorsalis pedis and posterior tibial pulses are full and equal bilaterally Extremities:  No clubbing, cyanosis, edema, or deformity noted with normal full range of motion of all joints.   Neurologic:  decreased sensation to PP and decreased sensation to LT.   Psych:  Cognition and judgment appear intact. Alert and cooperative with normal attention span  and concentration. No apparent delusions, illusions, hallucinations   Impression & Recommendations:  Problem # 1:  POLYMYALGIA RHEUMATICA (ICD-725) moniter sed rate for dose adjustment on 5 mg  Orders: Sedimentation Rate, non-automated (09811) Venipuncture (91478)  Problem # 2:  ANEMIA OF CHRONIC DISEASE (ICD-285.29) Assessment: Unchanged  His updated medication list for this problem includes:    Folbee 2.5-25-1 Mg Tabs (Folic acid-vit b6-vit b12) ..... One by mouth daily    Cyanocobalamin 1000 Mcg/ml Soln (Cyanocobalamin) .Marland KitchenMarland KitchenMarland KitchenMarland Kitchen 1ml monthly  Orders: TLB-CBC Platelet - w/Differential (85025-CBCD)  Hgb: 14.2 (12/26/2008)   Hct: 42.9 (12/26/2008)   Platelets: 218.0 (12/26/2008) RBC: 4.40 (12/26/2008)   RDW: 13.3 (12/26/2008)   WBC: 8.4 (12/26/2008) MCV: 97.5 (12/26/2008)   MCHC: 33.0 (12/26/2008) B12: 1395 (02/07/2009)   Folate: >20.0 ng/mL (02/07/2009)   TSH: 3.22 (03/01/2008)  Problem # 3:  POLYNEUROPATHY OTHER DISEASES CLASSIFIED ELSW (ICD-357.4) Assessment: Unchanged b12 shots and monitering  Problem # 4:  HYPERTENSION (ICD-401.9) Assessment: Unchanged  His updated medication list for this problem includes:    Lotensin Hct 10-12.5 Mg Tabs (Benazepril-hydrochlorothiazide) .Marland Kitchen... Take one by mouth daily    Atenolol 50 Mg Tabs (Atenolol) .Marland Kitchen... Take 1/2 tablet by mouth daily  Orders: TLB-BMP (Basic Metabolic Panel-BMET) (80048-METABOL)  BP today: 130/60 Prior BP: 136/60 (02/07/2009)  Prior 10 Yr Risk Heart Disease: Not enough information (12/11/2006)  Labs Reviewed: K+: 4.2 (12/26/2008) Creat: : 0.8 (12/26/2008)   Chol: 239 (06/16/2007)   HDL: 69.5 (06/16/2007)   LDL: DEL (06/16/2007)   TG: 61 (06/16/2007)  Complete Medication List: 1)  Lotensin Hct 10-12.5 Mg Tabs (Benazepril-hydrochlorothiazide) .... Take one by mouth daily 2)  Atenolol 50 Mg Tabs (Atenolol) .... Take 1/2 tablet by mouth daily 3)  Pepcid 20 Mg Tabs (Famotidine) .... Take one tablet by mouth two  times a day as needed 4)  Adult Aspirin Ec Low Strength 81 Mg Tbec (Aspirin) .... Take one by mouth daily 5)  Niacin 500 Mg Tabs (Niacin) .Marland Kitchen.. 1 two times a day 6)  Ra Saw Palmetto 80 Mg Caps (Saw palmetto (serenoa repens)) .... Once daily 7)  Miralax Powd (Polyethylene glycol 3350) .... Two times a day 8)  Fish Oil Concentrate 1000 Mg Caps (Omega-3 fatty acids) .... Two by mouth bid 9)  Calcium 500/d 500-400 Mg-unit Chew (Calcium-vitamin d) .... Two times a day 10)  Folbee 2.5-25-1 Mg Tabs (Folic acid-vit b6-vit b12) .... One by mouth daily 11)  Diclofenac Sodium 75 Mg Tbec (Diclofenac sodium) .Marland Kitchen.. 1 two times a day as needed--hold while on prednisone 12)  Cytotec 200 Mcg Tabs (Misoprostol) .... One tablet twice daily--hold while on prednisone 13)  Prednisone 5 Mg Tabs (Prednisone) .Marland Kitchen.. 1 once daily 14)  Cyanocobalamin 1000 Mcg/ml Soln (Cyanocobalamin) .Marland Kitchen.. 1ml  monthly  Patient Instructions: 1)  Please schedule a follow-up appointment in 2 months.     Orders Added: 1)  Est. Patient Level IV [36644] 2)  Sedimentation Rate, non-automated [85651] 3)  Venipuncture [36415] 4)  TLB-CBC Platelet - w/Differential [85025-CBCD] 5)  TLB-BMP (Basic Metabolic Panel-BMET) [80048-METABOL]   Appended Document: Orders Update     Clinical Lists Changes  Orders: Added new Service order of Vit B12 1000 mcg (I3474) - Signed Added new Service order of Admin of Therapeutic Inj  intramuscular or subcutaneous (25956) - Signed       Medication Administration  Injection # 1:    Medication: Vit B12 1000 mcg    Diagnosis: ANEMIA OF CHRONIC DISEASE (ICD-285.29)    Route: IM    Site: L deltoid    Exp Date: 11/23/2010    Lot #: [id number]    Mfr: American Regent    Patient tolerated injection without complications    Given by: Willy Eddy, LPN (May 08, 2009 10:05 AM)  Orders Added: 1)  Vit B12 1000 mcg [J3420] 2)  Admin of Therapeutic Inj  intramuscular or subcutaneous  [96372]  Appended Document: 3 month fup//ccm  Laboratory Results   Blood Tests     SED rate: 10 mm/hr  Comments: Rita Ohara  May 08, 2009 4:01 PM

## 2010-04-24 NOTE — Assessment & Plan Note (Signed)
Summary: 2 month rov/njr   Vital Signs:  Patient profile:   77 year old male Height:      73 inches Weight:      184 pounds BMI:     24.36 Temp:     98.2 degrees F oral Pulse rate:   72 / minute Resp:     14 per minute BP sitting:   130 / 64  (left arm)  Vitals Entered By: Willy Eddy, LPN (September 08, 2009 10:23 AM) CC: roa, Hypertension Management   Primary Care Provider:  Stacie Glaze MD  CC:  roa and Hypertension Management.  History of Present Illness: Back pain resonable control had a flair after a drive to [place]   Hypertension History:      He denies headache, chest pain, palpitations, dyspnea with exertion, orthopnea, PND, peripheral edema, visual symptoms, neurologic problems, syncope, and side effects from treatment.        Positive major cardiovascular risk factors include male age 34 years old or older, hyperlipidemia, and hypertension.  Negative major cardiovascular risk factors include negative family history for ischemic heart disease and non-tobacco-user status.     Preventive Screening-Counseling & Management  Alcohol-Tobacco     Smoking Status: never     Passive Smoke Exposure: no  Problems Prior to Update: 1)  Dupuytren's Contracture, Right  (ICD-728.6) 2)  Degenerative Disc Disease, Lumbosacral Spine W/radiculopathy  (ICD-722.10) 3)  Anemia of Chronic Disease  (ICD-285.29) 4)  Carcinoma, Basal Cell  (ICD-173.9) 5)  Bursitis, Right Shoulder  (ICD-726.10) 6)  Pleurisy  (ICD-511.0) 7)  Dry Skin  (ICD-701.1) 8)  Cataract Associated With Other Syndromes  (ICD-366.44) 9)  Cerumen Impaction, Bilateral  (ICD-380.4) 10)  Ing Hernia w/o Mention Obstruction/gangren Bilat  (ICD-550.92) 11)  Cramp in Limb  (ICD-729.82) 12)  Polymyalgia Rheumatica  (ICD-725) 13)  Shoulder Pain, Right  (ICD-719.41) 14)  Back Pain, Acute  (ICD-724.5) 15)  Constipation, Drug Induced  (ICD-564.09) 16)  Depression, Situational, Acute  (ICD-300.4) 17)  Back Pain   (ICD-724.5) 18)  Polyneuropathy Other Diseases Classified Elsw  (ICD-357.4) 19)  Cellulitis and Abscess of Upper Arm and Forearm  (ICD-682.3) 20)  Weight Loss, Abnormal  (ICD-783.21) 21)  Osteoarthritis  (ICD-715.90) 22)  Hypertension  (ICD-401.9) 23)  Hyperlipidemia  (ICD-272.4) 24)  Gerd  (ICD-530.81)  Current Problems (verified): 1)  Dupuytren's Contracture, Right  (ICD-728.6) 2)  Degenerative Disc Disease, Lumbosacral Spine W/radiculopathy  (ICD-722.10) 3)  Anemia of Chronic Disease  (ICD-285.29) 4)  Carcinoma, Basal Cell  (ICD-173.9) 5)  Bursitis, Right Shoulder  (ICD-726.10) 6)  Pleurisy  (ICD-511.0) 7)  Dry Skin  (ICD-701.1) 8)  Cataract Associated With Other Syndromes  (ICD-366.44) 9)  Cerumen Impaction, Bilateral  (ICD-380.4) 10)  Ing Hernia w/o Mention Obstruction/gangren Bilat  (ICD-550.92) 11)  Cramp in Limb  (ICD-729.82) 12)  Polymyalgia Rheumatica  (ICD-725) 13)  Shoulder Pain, Right  (ICD-719.41) 14)  Back Pain, Acute  (ICD-724.5) 15)  Constipation, Drug Induced  (ICD-564.09) 16)  Depression, Situational, Acute  (ICD-300.4) 17)  Back Pain  (ICD-724.5) 18)  Polyneuropathy Other Diseases Classified Elsw  (ICD-357.4) 19)  Cellulitis and Abscess of Upper Arm and Forearm  (ICD-682.3) 20)  Weight Loss, Abnormal  (ICD-783.21) 21)  Osteoarthritis  (ICD-715.90) 22)  Hypertension  (ICD-401.9) 23)  Hyperlipidemia  (ICD-272.4) 24)  Gerd  (ICD-530.81)  Medications Prior to Update: 1)  Lotensin Hct 10-12.5 Mg  Tabs (Benazepril-Hydrochlorothiazide) .... Take One By Mouth Daily 2)  Atenolol 50 Mg  Tabs (  Atenolol) .... Take 1/2 Tablet By Mouth Daily 3)  Pepcid 20 Mg  Tabs (Famotidine) .... Take One Tablet By Mouth Two Times A Day As Needed 4)  Adult Aspirin Ec Low Strength 81 Mg  Tbec (Aspirin) .... Take One By Mouth Daily 5)  Niacin 500 Mg Tabs (Niacin) .Marland Kitchen.. 1 Two Times A Day 6)  Ra Saw Palmetto 80 Mg  Caps (Saw Palmetto (Serenoa Repens)) .... Once Daily 7)  Miralax   Powd  (Polyethylene Glycol 3350) .... Two Times A Day 8)  Fish Oil Concentrate 1000 Mg  Caps (Omega-3 Fatty Acids) .... Two By Mouth Bid 9)  Calcium 500/d 500-400 Mg-Unit Chew (Calcium-Vitamin D) .... Two Times A Day 10)  Folbee 2.5-25-1 Mg Tabs (Folic Acid-Vit B6-Vit B12) .... One By Mouth Daily 11)  Prednisone 1 Mg Tabs (Prednisone) .Marland Kitchen.. 1 Once Daily For 14 Days and Then Stop 12)  Cyanocobalamin 1000 Mcg/ml Soln (Cyanocobalamin) .Marland Kitchen.. 1ml Monthly 13)  Vimovo 500-20 Mg Tbec (Naproxen-Esomeprazole) .... One By Mouth Two Times A Day 14)  Soma 250 Mg Tabs (Carisoprodol) .... 1/2 To One By Mouth Daily  Current Medications (verified): 1)  Lotensin Hct 10-12.5 Mg  Tabs (Benazepril-Hydrochlorothiazide) .... Take One By Mouth Daily 2)  Atenolol 50 Mg  Tabs (Atenolol) .... Take 1/2 Tablet By Mouth Daily 3)  Pepcid 20 Mg  Tabs (Famotidine) .... Take One Tablet By Mouth Two Times A Day As Needed 4)  Adult Aspirin Ec Low Strength 81 Mg  Tbec (Aspirin) .... Take One By Mouth Daily 5)  Niacin 500 Mg Tabs (Niacin) .Marland Kitchen.. 1 Two Times A Day 6)  Ra Saw Palmetto 80 Mg  Caps (Saw Palmetto (Serenoa Repens)) .... Once Daily 7)  Miralax   Powd (Polyethylene Glycol 3350) .... Two Times A Day 8)  Fish Oil Concentrate 1000 Mg  Caps (Omega-3 Fatty Acids) .... Two By Mouth Bid 9)  Calcium 500/d 500-400 Mg-Unit Chew (Calcium-Vitamin D) .... Two Times A Day 10)  Folbee 2.5-25-1 Mg Tabs (Folic Acid-Vit B6-Vit B12) .... One By Mouth Daily 11)  Cyanocobalamin 1000 Mcg/ml Soln (Cyanocobalamin) .Marland Kitchen.. 1ml Monthly 12)  Vimovo 500-20 Mg Tbec (Naproxen-Esomeprazole) .... One By Mouth Two Times A Day  Allergies (verified): 1)  ! * Statins  Past History:  Family History: Last updated: 09/22/2006 Family History of Cardiovascular disorder Fam hx CAD Fam hx MI  Social History: Last updated: 12/04/2007 Retired Married Alcohol use-yes wife--deceased with cancer  Risk Factors: Smoking Status: never (09/08/2009) Passive Smoke  Exposure: no (09/08/2009)  Past medical, surgical, family and social histories (including risk factors) reviewed, and no changes noted (except as noted below).  Past Medical History: Reviewed history from 01/18/2008 and no changes required. GERD Hyperlipidemia Hypertension Osteoarthritis-knees poly myagia rhematica  Past Surgical History: Reviewed history from 10/24/2006 and no changes required. Colonoscopy-07/30/2005 Tonsillectomy  Family History: Reviewed history from 09/22/2006 and no changes required. Family History of Cardiovascular disorder Fam hx CAD Fam hx MI  Social History: Reviewed history from 12/04/2007 and no changes required. Retired Married Alcohol use-yes wife--deceased with cancer  Review of Systems  The patient denies anorexia, fever, weight loss, weight gain, vision loss, decreased hearing, hoarseness, chest pain, syncope, dyspnea on exertion, peripheral edema, prolonged cough, headaches, hemoptysis, abdominal pain, melena, hematochezia, severe indigestion/heartburn, hematuria, incontinence, genital sores, muscle weakness, suspicious skin lesions, transient blindness, difficulty walking, depression, unusual weight change, abnormal bleeding, enlarged lymph nodes, angioedema, and breast masses.    Physical Exam  General:  Well-developed, well-nourished, normal body  habitus; no deformities, normal grooming.   Head:  normocephalic and male-pattern balding.   Eyes:  pupils equal, pupils round, pupils reactive to light, and no injection.   Ears:  R ear normal and L ear normal.   Nose:  External nasal examination shows no deformity or inflammation. Nasal mucosa are pink and moist without lesions or exudates. Mouth:  pharynx pink and moist, no erythema, and no exudates.   Neck:  supple, full ROM, and no masses.   Lungs:  normal respiratory effort and no wheezes.   Heart:  normal rate and regular rhythm.   Abdomen:  soft, non-tender, and normal bowel sounds.     Msk:  lumbar lordosis, SI joint tenderness, and trigger point tenderness.   Extremities:  1+ left pedal edema and 1+ right pedal edema.   Neurologic:  alert & oriented X3 and DTRs symmetrical and normal.     Impression & Recommendations:  Problem # 1:  DEGENERATIVE DISC DISEASE, LUMBOSACRAL SPINE W/RADICULOPATHY (ICD-722.10) pain is acceptable increased pain radiating nto the legs that comes and goes las back in jection was 2-3 months  Problem # 2:  POLYMYALGIA RHEUMATICA (ICD-725)  moniter sed rate today  Orders: TLB-Sedimentation Rate (ESR) (85652-ESR)  Problem # 3:  BURSITIS, RIGHT SHOULDER (ICD-726.10)  persistant pai post lifing wth move cannot lie on right side Informed consent obtained and then the joint was prepped in a sterile manor and 40 mg depo and 1/2 cc 1% lidocaine injected into the synovial space. After care discussed. Pt tolerated procedure well.  Orders: Joint Aspirate / Injection, Large (20610) Depo- Medrol 40mg  (J1030)  Complete Medication List: 1)  Lotensin Hct 10-12.5 Mg Tabs (Benazepril-hydrochlorothiazide) .... Take one by mouth daily 2)  Atenolol 50 Mg Tabs (Atenolol) .... Take 1/2 tablet by mouth daily 3)  Pepcid 20 Mg Tabs (Famotidine) .... Take one tablet by mouth two times a day as needed 4)  Adult Aspirin Ec Low Strength 81 Mg Tbec (Aspirin) .... Take one by mouth daily 5)  Niacin 500 Mg Tabs (Niacin) .Marland Kitchen.. 1 two times a day 6)  Ra Saw Palmetto 80 Mg Caps (Saw palmetto (serenoa repens)) .... Once daily 7)  Miralax Powd (Polyethylene glycol 3350) .... Two times a day 8)  Fish Oil Concentrate 1000 Mg Caps (Omega-3 fatty acids) .... Two by mouth bid 9)  Calcium 500/d 500-400 Mg-unit Chew (Calcium-vitamin d) .... Two times a day 10)  Folbee 2.5-25-1 Mg Tabs (Folic acid-vit b6-vit b12) .... One by mouth daily 11)  Cyanocobalamin 1000 Mcg/ml Soln (Cyanocobalamin) .Marland Kitchen.. 1ml monthly 12)  Vimovo 500-20 Mg Tbec (Naproxen-esomeprazole) .... One by mouth two  times a day  Other Orders: TLB-IBC Pnl (Iron/FE;Transferrin) (83550-IBC) TLB-CBC Platelet - w/Differential (85025-CBCD) Venipuncture (16109)  Hypertension Assessment/Plan:      The patient's hypertensive risk group is category B: At least one risk factor (excluding diabetes) with no target organ damage.  Today's blood pressure is 130/64.  His blood pressure goal is < 140/90.  Patient Instructions: 1)  Please schedule a follow-up appointment in 3 months. 2)  fasting visit 30 min medicare welness

## 2010-04-24 NOTE — Assessment & Plan Note (Signed)
Summary: roa/bmw   Vital Signs:  Patient profile:   77 year old male Height:      73 inches Weight:      184 pounds BMI:     24.36 Temp:     98.6 degrees F oral Pulse rate:   72 / minute Resp:     14 per minute BP sitting:   126 / 70  (left arm)  Vitals Entered By: Willy Eddy, LPN (October 16, 2009 2:14 PM) CC: c/o fingers being numb and painful for about 3 weeks-couldnt close them or pick up anything and swollen- somebetter now.but still painful worse in am   Primary Care Provider:  Stacie Glaze MD  CC:  c/o fingers being numb and painful for about 3 weeks-couldnt close them or pick up anything and swollen- somebetter now.but still painful worse in am.  History of Present Illness: off the prednisoen for 2-3 months hx of PMR now with pain in fingers 2,3,4 digins more on the right than the left the sensations is one of numbness and soreness in the fingertips stiff and felt like they were swollen and cound not make a fist pain with clincking  polyarthropathy? then has not been significant neck pain  Preventive Screening-Counseling & Management  Alcohol-Tobacco     Smoking Status: never     Passive Smoke Exposure: no  Problems Prior to Update: 1)  Dupuytren's Contracture, Right  (ICD-728.6) 2)  Degenerative Disc Disease, Lumbosacral Spine W/radiculopathy  (ICD-722.10) 3)  Anemia of Chronic Disease  (ICD-285.29) 4)  Carcinoma, Basal Cell  (ICD-173.9) 5)  Bursitis, Right Shoulder  (ICD-726.10) 6)  Pleurisy  (ICD-511.0) 7)  Dry Skin  (ICD-701.1) 8)  Cataract Associated With Other Syndromes  (ICD-366.44) 9)  Cerumen Impaction, Bilateral  (ICD-380.4) 10)  Ing Hernia w/o Mention Obstruction/gangren Bilat  (ICD-550.92) 11)  Cramp in Limb  (ICD-729.82) 12)  Polymyalgia Rheumatica  (ICD-725) 13)  Shoulder Pain, Right  (ICD-719.41) 14)  Back Pain, Acute  (ICD-724.5) 15)  Constipation, Drug Induced  (ICD-564.09) 16)  Depression, Situational, Acute  (ICD-300.4) 17)   Back Pain  (ICD-724.5) 18)  Polyneuropathy Other Diseases Classified Elsw  (ICD-357.4) 19)  Cellulitis and Abscess of Upper Arm and Forearm  (ICD-682.3) 20)  Weight Loss, Abnormal  (ICD-783.21) 21)  Osteoarthritis  (ICD-715.90) 22)  Hypertension  (ICD-401.9) 23)  Hyperlipidemia  (ICD-272.4) 24)  Gerd  (ICD-530.81)  Current Problems (verified): 1)  Dupuytren's Contracture, Right  (ICD-728.6) 2)  Degenerative Disc Disease, Lumbosacral Spine W/radiculopathy  (ICD-722.10) 3)  Anemia of Chronic Disease  (ICD-285.29) 4)  Carcinoma, Basal Cell  (ICD-173.9) 5)  Bursitis, Right Shoulder  (ICD-726.10) 6)  Pleurisy  (ICD-511.0) 7)  Dry Skin  (ICD-701.1) 8)  Cataract Associated With Other Syndromes  (ICD-366.44) 9)  Cerumen Impaction, Bilateral  (ICD-380.4) 10)  Ing Hernia w/o Mention Obstruction/gangren Bilat  (ICD-550.92) 11)  Cramp in Limb  (ICD-729.82) 12)  Polymyalgia Rheumatica  (ICD-725) 13)  Shoulder Pain, Right  (ICD-719.41) 14)  Back Pain, Acute  (ICD-724.5) 15)  Constipation, Drug Induced  (ICD-564.09) 16)  Depression, Situational, Acute  (ICD-300.4) 17)  Back Pain  (ICD-724.5) 18)  Polyneuropathy Other Diseases Classified Elsw  (ICD-357.4) 19)  Cellulitis and Abscess of Upper Arm and Forearm  (ICD-682.3) 20)  Weight Loss, Abnormal  (ICD-783.21) 21)  Osteoarthritis  (ICD-715.90) 22)  Hypertension  (ICD-401.9) 23)  Hyperlipidemia  (ICD-272.4) 24)  Gerd  (ICD-530.81)  Medications Prior to Update: 1)  Lotensin Hct 10-12.5 Mg  Tabs (Benazepril-Hydrochlorothiazide) .Marland KitchenMarland KitchenMarland Kitchen  Take One By Mouth Daily 2)  Atenolol 50 Mg  Tabs (Atenolol) .... Take 1/2 Tablet By Mouth Daily 3)  Pepcid 20 Mg  Tabs (Famotidine) .... Take One Tablet By Mouth Two Times A Day As Needed 4)  Adult Aspirin Ec Low Strength 81 Mg  Tbec (Aspirin) .... Take One By Mouth Daily 5)  Niacin 500 Mg Tabs (Niacin) .Marland Kitchen.. 1 Two Times A Day 6)  Ra Saw Palmetto 80 Mg  Caps (Saw Palmetto (Serenoa Repens)) .... Once Daily 7)   Miralax   Powd (Polyethylene Glycol 3350) .... Two Times A Day 8)  Fish Oil Concentrate 1000 Mg  Caps (Omega-3 Fatty Acids) .... Two By Mouth Bid 9)  Calcium 500/d 500-400 Mg-Unit Chew (Calcium-Vitamin D) .... Two Times A Day 10)  Folbee 2.5-25-1 Mg Tabs (Folic Acid-Vit B6-Vit B12) .... One By Mouth Daily 11)  Cyanocobalamin 1000 Mcg/ml Soln (Cyanocobalamin) .Marland Kitchen.. 1ml Monthly 12)  Vimovo 500-20 Mg Tbec (Naproxen-Esomeprazole) .... One By Mouth Two Times A Day 13)  Levitra 10 Mg Tabs (Vardenafil Hcl) .Marland Kitchen.. 1 Once Daily As Needed  Current Medications (verified): 1)  Lotensin Hct 10-12.5 Mg  Tabs (Benazepril-Hydrochlorothiazide) .... Take One By Mouth Daily 2)  Atenolol 50 Mg  Tabs (Atenolol) .... Take 1/2 Tablet By Mouth Daily 3)  Pepcid 20 Mg  Tabs (Famotidine) .... Take One Tablet By Mouth Two Times A Day As Needed 4)  Adult Aspirin Ec Low Strength 81 Mg  Tbec (Aspirin) .... Take One By Mouth Daily 5)  Niacin 500 Mg Tabs (Niacin) .Marland Kitchen.. 1 Two Times A Day 6)  Ra Saw Palmetto 80 Mg  Caps (Saw Palmetto (Serenoa Repens)) .... Once Daily 7)  Miralax   Powd (Polyethylene Glycol 3350) .... Two Times A Day 8)  Fish Oil Concentrate 1000 Mg  Caps (Omega-3 Fatty Acids) .... Two By Mouth Bid 9)  Calcium 500/d 500-400 Mg-Unit Chew (Calcium-Vitamin D) .... Two Times A Day 10)  Folbee 2.5-25-1 Mg Tabs (Folic Acid-Vit B6-Vit B12) .... One By Mouth Daily 11)  Cyanocobalamin 1000 Mcg/ml Soln (Cyanocobalamin) .Marland Kitchen.. 1ml Monthly 12)  Naprosyn 500 Mg Tabs (Naproxen) .... 2 Once Daily With Prilosec 13)  Levitra 10 Mg Tabs (Vardenafil Hcl) .Marland Kitchen.. 1 Once Daily As Needed  Allergies (verified): 1)  ! * Statins  Past History:  Family History: Last updated: 09/22/2006 Family History of Cardiovascular disorder Fam hx CAD Fam hx MI  Social History: Last updated: 12/04/2007 Retired Married Alcohol use-yes wife--deceased with cancer  Risk Factors: Smoking Status: never (10/16/2009) Passive Smoke Exposure: no  (10/16/2009)  Past medical, surgical, family and social histories (including risk factors) reviewed, and no changes noted (except as noted below).  Past Medical History: Reviewed history from 01/18/2008 and no changes required. GERD Hyperlipidemia Hypertension Osteoarthritis-knees poly myagia rhematica  Past Surgical History: Reviewed history from 10/24/2006 and no changes required. Colonoscopy-07/30/2005 Tonsillectomy  Family History: Reviewed history from 09/22/2006 and no changes required. Family History of Cardiovascular disorder Fam hx CAD Fam hx MI  Social History: Reviewed history from 12/04/2007 and no changes required. Retired Married Alcohol use-yes wife--deceased with cancer  Review of Systems       The patient complains of difficulty walking.  The patient denies anorexia, fever, weight loss, weight gain, vision loss, decreased hearing, hoarseness, chest pain, syncope, dyspnea on exertion, peripheral edema, prolonged cough, headaches, hemoptysis, abdominal pain, melena, hematochezia, severe indigestion/heartburn, hematuria, incontinence, genital sores, muscle weakness, suspicious skin lesions, transient blindness, depression, and unusual weight change.  myalgias and arthralgias  Physical Exam  General:  Well-developed, well-nourished, normal body habitus; no deformities, normal grooming.   Head:  normocephalic and male-pattern balding.   Eyes:  pupils equal, pupils round, pupils reactive to light, and no injection.   Ears:  R ear normal and L ear normal.   Nose:  External nasal examination shows no deformity or inflammation. Nasal mucosa are pink and moist without lesions or exudates. Neck:  supple, full ROM, and no masses.   Lungs:  normal respiratory effort and no wheezes.   Heart:  normal rate and regular rhythm.   Abdomen:  soft, non-tender, and normal bowel sounds.   Msk:  decreased ROM, joint tenderness, and joint swelling.   Pulses:  R and L  carotid,radial,femoral,dorsalis pedis and posterior tibial pulses are full and equal bilaterally Extremities:  trace left pedal edema and trace right pedal edema. brisk capillary refill   Neurologic:  alert & oriented X3 and gait normal.   Cervical Nodes:  No lymphadenopathy noted Axillary Nodes:  No palpable lymphadenopathy Inguinal Nodes:  No significant adenopathy Psych:  Oriented X3 and not depressed appearing.     Impression & Recommendations:  Problem # 1:  UNSPEC POLYARTHROPATHY/POLYARTHRIT OTH SPEC SITE (ICD-716.58) Assessment Deteriorated has tender MCP and DIP joints with good cap refill but pronouced weakness of   this lead to the diagnosis of cervial dz and less likey inflamatory    Orders: Venipuncture (16109) T-Antinuclear Antib (ANA) ([id number]) Specimen Handling (91478) T-RA (Rheumatoid Factor) ([id number]) Neurology Referral (Neuro) TLB-B12 + Folate Pnl ([id number]_96295-M84/XLK) TLB-Sedimentation Rate (ESR) (85652-ESR)  Problem # 2:  DEGENERATIVE DISC DISEASE, LUMBOSACRAL SPINE W/RADICULOPATHY (ICD-722.10) Assessment: Deteriorated the pts back pain is chronic  Problem # 3:  SEC LOCALIZED OSTEOARTHROSIS INVOLVING HAND (GMW-102.72) Assessment: Unchanged  His updated medication list for this problem includes:    Adult Aspirin Ec Low Strength 81 Mg Tbec (Aspirin) .Marland Kitchen... Take one by mouth daily    Naprosyn 500 Mg Tabs (Naproxen) .Marland Kitchen... 2 once daily with prilosec  Discussed use of medications, application of heat or cold, and exercises.   Complete Medication List: 1)  Lotensin Hct 10-12.5 Mg Tabs (Benazepril-hydrochlorothiazide) .... Take one by mouth daily 2)  Atenolol 50 Mg Tabs (Atenolol) .... Take 1/2 tablet by mouth daily 3)  Pepcid 20 Mg Tabs (Famotidine) .... Take one tablet by mouth two times a day as needed 4)  Adult Aspirin Ec Low Strength 81 Mg Tbec (Aspirin) .... Take one by mouth daily 5)  Niacin 500 Mg Tabs (Niacin) .Marland Kitchen.. 1 two times a day 6)  Ra  Saw Palmetto 80 Mg Caps (Saw palmetto (serenoa repens)) .... Once daily 7)  Miralax Powd (Polyethylene glycol 3350) .... Two times a day 8)  Fish Oil Concentrate 1000 Mg Caps (Omega-3 fatty acids) .... Two by mouth bid 9)  Calcium 500/d 500-400 Mg-unit Chew (Calcium-vitamin d) .... Two times a day 10)  Folbee 2.5-25-1 Mg Tabs (Folic acid-vit b6-vit b12) .... One by mouth daily 11)  Cyanocobalamin 1000 Mcg/ml Soln (Cyanocobalamin) .Marland Kitchen.. 1ml monthly 12)  Naprosyn 500 Mg Tabs (Naproxen) .... 2 once daily with prilosec 13)  Levitra 10 Mg Tabs (Vardenafil hcl) .Marland Kitchen.. 1 once daily as needed  Patient Instructions: 1)  Please schedule a follow-up appointment in 2 weeks.  may use 4 pm or 415 is needed

## 2010-04-24 NOTE — Assessment & Plan Note (Signed)
Summary: bilateral leg edema/dm   Vital Signs:  Patient profile:   77 year old male Weight:      191 pounds Temp:     97.8 degrees F oral BP sitting:   130 / 70  (left arm) Cuff size:   regular  Vitals Entered By: Sid Falcon LPN (November 30, 2009 11:47 AM)  History of Present Illness: Patient seen same day appointment one-day history increased edema feet and legs bilaterally.  Patient denies history of similar problem. Has some mild discomfort bilaterally. No associated dyspnea or orthopnea. No history of CHF. Only recent medication changes addition of Naprosyn 500 mg b.i.d. a few weeks ago. He also takes Lotensin HCTZ and atenolol.  Denies any chest pains, or dizziness. Has had some recent weight gain. does have history of varicosities lower extremities and usually wear support hose but not for the past couple of weeks  recent surgery for carpal tunnel right hand and that seems to be healing well.  Allergies: 1)  ! * Statins  Past History:  Past Medical History: Last updated: 01/18/2008 GERD Hyperlipidemia Hypertension Osteoarthritis-knees poly myagia rhematica  Past Surgical History: Last updated: 10/24/2006 Colonoscopy-07/30/2005 Tonsillectomy  Family History: Last updated: 09/22/2006 Family History of Cardiovascular disorder Fam hx CAD Fam hx MI  Social History: Last updated: 12/04/2007 Retired Married Alcohol use-yes wife--deceased with cancer  Risk Factors: Smoking Status: never (10/30/2009) Passive Smoke Exposure: no (10/30/2009) PMH-FH-SH reviewed for relevance  Review of Systems       The patient complains of weight gain and peripheral edema.  The patient denies anorexia, fever, weight loss, chest pain, syncope, dyspnea on exertion, prolonged cough, headaches, hemoptysis, abdominal pain, melena, hematochezia, and severe indigestion/heartburn.    Physical Exam  General:  Well-developed,well-nourished,in no acute distress; alert,appropriate  and cooperative throughout examination Mouth:  Oral mucosa and oropharynx without lesions or exudates.  Teeth in good repair. Neck:  No deformities, masses, or tenderness noted. Lungs:  Normal respiratory effort, chest expands symmetrically. Lungs are clear to auscultation, no crackles or wheezes. Heart:  normal rate and regular rhythm.   Abdomen:  soft, non-tender, and no hepatomegaly.   Extremities:  1 plus edema feet and legs bilaterally. Neurologic:  alert & oriented X3, cranial nerves II-XII intact, and gait normal.     Impression & Recommendations:  Problem # 1:  LEG EDEMA (ICD-782.3) Assessment New  ?aggravated by Naprosyn.  Hold Naprosyn, check labs, and get back on support hose. His updated medication list for this problem includes:    Lotensin Hct 10-12.5 Mg Tabs (Benazepril-hydrochlorothiazide) .Marland Kitchen... Take one by mouth daily  Orders: UA Dipstick w/o Micro (manual) (27253) Specimen Handling (99000) TLB-BMP (Basic Metabolic Panel-BMET) (80048-METABOL)  Complete Medication List: 1)  Lotensin Hct 10-12.5 Mg Tabs (Benazepril-hydrochlorothiazide) .... Take one by mouth daily 2)  Atenolol 50 Mg Tabs (Atenolol) .... Take 1/2 tablet by mouth daily 3)  Pepcid 20 Mg Tabs (Famotidine) .... Take one tablet by mouth two times a day as needed 4)  Adult Aspirin Ec Low Strength 81 Mg Tbec (Aspirin) .... Take one by mouth daily 5)  Niacin 500 Mg Tabs (Niacin) .Marland Kitchen.. 1 two times a day 6)  Ra Saw Palmetto 80 Mg Caps (Saw palmetto (serenoa repens)) .... Once daily 7)  Miralax Powd (Polyethylene glycol 3350) .... Two times a day 8)  Fish Oil Concentrate 1000 Mg Caps (Omega-3 fatty acids) .... Two by mouth bid 9)  Calcium 500/d 500-400 Mg-unit Chew (Calcium-vitamin d) .... Two times a day  10)  Folbee 2.5-25-1 Mg Tabs (Folic acid-vit b6-vit b12) .... One by mouth daily 11)  Cyanocobalamin 1000 Mcg/ml Soln (Cyanocobalamin) .Marland Kitchen.. 1ml monthly 12)  Naprosyn 500 Mg Tabs (Naproxen) .... 2 once daily  with prilosec 13)  Levitra 10 Mg Tabs (Vardenafil hcl) .Marland Kitchen.. 1 once daily as needed  Patient Instructions: 1)  Elevate feet and legs frequently. 2)  Get back on daily use of support hose. 3)  Limit salt use. 4)  Hold Naprosyn for now. 5)  Please schedule a follow-up appointment in 2 weeks with Dr Lovell Sheehan. 6)  Get daily weights, if possible, and record.  Laboratory Results   Urine Tests    Routine Urinalysis   Color: yellow Appearance: Clear Glucose: negative   (Normal Range: Negative) Bilirubin: negative   (Normal Range: Negative) Ketone: negative   (Normal Range: Negative) Spec. Gravity: 1.015   (Normal Range: 1.003-1.035) Blood: negative   (Normal Range: Negative) pH: 6.5   (Normal Range: 5.0-8.0) Protein: 100   (Normal Range: Negative) Urobilinogen: 0.2   (Normal Range: 0-1) Nitrite: negative   (Normal Range: Negative) Leukocyte Esterace: negative   (Normal Range: Negative)    Comments: Sid Falcon LPN  November 30, 2009 12:32 PM

## 2010-04-24 NOTE — Consult Note (Signed)
Summary: [facility]  [facility] [place]   Imported By: Maryln Gottron 11/21/2009 15:50:46  _____________________________________________________________________  External Attachment:    Type:   Image     Comment:   External Document

## 2010-04-24 NOTE — Letter (Signed)
[facility]: [facility]   Imported By: Maryln Gottron 01/04/2010 13:46:47  _____________________________________________________________________  External Attachment:    Type:   Image     Comment:   External Document

## 2010-04-24 NOTE — Letter (Signed)
Summary: [facility] [place]  [facility] [place]   Imported By: Maryln Gottron 01/15/2010 13:24:35  _____________________________________________________________________  External Attachment:    Type:   Image     Comment:   External Document

## 2010-04-24 NOTE — Progress Notes (Signed)
Summary: problem with hands, requesting OV  Phone Note Call from Patient Call back at Home Phone [phone] Call back at Work Phone [phone]   Caller: Patient Call For: Stacie Glaze MD Reason for Call: Talk to Nurse Summary of Call: Triage call from pt requesting OV with Dr Lovell Sheehan for increased problems with his hands, R > L.  Fingers going numb, tingling alot.  Offered OV with another provider, pt declined. Initial call taken by: Sid Falcon LPN,  October 16, 2009 8:46 AM  Follow-up for Phone Call        ov g iven for this pm Follow-up by: Willy Eddy, LPN,  October 16, 2009 10:32 AM

## 2010-04-24 NOTE — Assessment & Plan Note (Signed)
Summary: pt will come in fasting/njr   Vital Signs:  Patient profile:   77 year old male Height:      73 inches Weight:      181 pounds BMI:     23.97 Temp:     98.2 degrees F oral Pulse rate:   68 / minute Resp:     14 per minute BP sitting:   120 / 70  (left arm)  Vitals Entered By: Willy Eddy, LPN (December 12, 2009 11:16 AM) CC: annualvisit for disease managment-fasting this am Is Patient Diabetic? No  Vision Screening:Left eye with correction: 20 / 20 Right eye with correction: 20 / 20 Both eyes with correction: 20 / 20         Contraindications/Deferment of Procedures/Staging:    Test/Procedure: FLU VAX    Reason for deferment: patient declined   Primary Care Kiona Blume:  Stacie Glaze MD  CC:  annualvisit for disease managment-fasting this am.  History of Present Illness: Here for Medicare AWV:  1.   Risk factors based on Past M, S, F history: risks of OA and ncreased fracture risks                 CV risks nominal for age, controlled HTN and lipids 2.   Physical Activities:  limited due to OA 3.   Depression/mood:  not depressed 4.   Hearing:  has not had exame but can hear whispered voice at 6 feet 5.   ADL's:  independant and recieves no needed care assitance 6.   Fall Risk:  lives on one level and walks daily 7.   Home Safety: no gun, no identified safety risk  8.   Height, weight, &visual acuity:  see chart 9.   Counseling:   none identified 10.   Labs ordered based on risk factors:  see order 11.           Referral Coordination carple tunnel with [facility] for cataracts 12.           Care Plan see form scanned into the chart dated today 13.            Cognitive Assessment alert and oriented x 3 can perform routine calulations and has no judgement issues, can identyfy the president and three objects at 5 min of memory  legs were swelling ansd stopped the naprosyn and the edema stopped the OA in the hand has improved post sugery has  limited the slat in his die t and the blood pressure has stabilized   Preventive Screening-Counseling & Management  Alcohol-Tobacco     Smoking Status: never     Passive Smoke Exposure: no     Tobacco Counseling: not indicated; no tobacco use  Caffeine-Diet-Exercise     Does Patient Exercise: yes     Type of exercise: waling     Exercise (avg: min/session): 20     Times/week: 3  Colonoscopy  Procedure date:  11/24/2006  Findings:      Results: Normal. Results: Diverticulosis.       Location:  [facility].    Problems Prior to Update: 1)  Sec Localized Osteoarthrosis Involving Hand  (ICD-715.24) 2)  Unspec Polyarthropathy/polyarthrit Oth Spec Site  (ICD-716.58) 3)  Dupuytren's Contracture, Right  (ICD-728.6) 4)  Degenerative Disc Disease, Lumbosacral Spine W/radiculopathy  (ICD-722.10) 5)  Anemia of Chronic Disease  (ICD-285.29) 6)  Carcinoma, Basal Cell  (ICD-173.9) 7)  Bursitis, Right Shoulder  (ICD-726.10) 8)  Pleurisy  (ICD-511.0) 9)  Dry Skin  (ICD-701.1) 10)  Cataract Associated With Other Syndromes  (ICD-366.44) 11)  Cerumen Impaction, Bilateral  (ICD-380.4) 12)  Ing Hernia w/o Mention Obstruction/gangren Bilat  (ICD-550.92) 13)  Cramp in Limb  (ICD-729.82) 14)  Polymyalgia Rheumatica  (ICD-725) 15)  Shoulder Pain, Right  (ICD-719.41) 16)  Back Pain, Acute  (ICD-724.5) 17)  Constipation, Drug Induced  (ICD-564.09) 18)  Depression, Situational, Acute  (ICD-300.4) 19)  Back Pain  (ICD-724.5) 20)  Polyneuropathy Other Diseases Classified Elsw  (ICD-357.4) 21)  Cellulitis and Abscess of Upper Arm and Forearm  (ICD-682.3) 22)  Weight Loss, Abnormal  (ICD-783.21) 23)  Osteoarthritis  (ICD-715.90) 24)  Hypertension  (ICD-401.9) 25)  Hyperlipidemia  (ICD-272.4) 26)  Gerd  (ICD-530.81)  Current Problems (verified): 1)  Sec Localized Osteoarthrosis Involving Hand  (ICD-715.24) 2)  Unspec Polyarthropathy/polyarthrit Oth Spec Site  (ICD-716.58) 3)   Dupuytren's Contracture, Right  (ICD-728.6) 4)  Degenerative Disc Disease, Lumbosacral Spine W/radiculopathy  (ICD-722.10) 5)  Anemia of Chronic Disease  (ICD-285.29) 6)  Carcinoma, Basal Cell  (ICD-173.9) 7)  Bursitis, Right Shoulder  (ICD-726.10) 8)  Pleurisy  (ICD-511.0) 9)  Dry Skin  (ICD-701.1) 10)  Cataract Associated With Other Syndromes  (ICD-366.44) 11)  Cerumen Impaction, Bilateral  (ICD-380.4) 12)  Ing Hernia w/o Mention Obstruction/gangren Bilat  (ICD-550.92) 13)  Cramp in Limb  (ICD-729.82) 14)  Polymyalgia Rheumatica  (ICD-725) 15)  Shoulder Pain, Right  (ICD-719.41) 16)  Back Pain, Acute  (ICD-724.5) 17)  Constipation, Drug Induced  (ICD-564.09) 18)  Depression, Situational, Acute  (ICD-300.4) 19)  Back Pain  (ICD-724.5) 20)  Polyneuropathy Other Diseases Classified Elsw  (ICD-357.4) 21)  Cellulitis and Abscess of Upper Arm and Forearm  (ICD-682.3) 22)  Weight Loss, Abnormal  (ICD-783.21) 23)  Osteoarthritis  (ICD-715.90) 24)  Hypertension  (ICD-401.9) 25)  Hyperlipidemia  (ICD-272.4) 26)  Gerd  (ICD-530.81)  Medications Prior to Update: 1)  Lotensin Hct 10-12.5 Mg  Tabs (Benazepril-Hydrochlorothiazide) .... Take One By Mouth Daily 2)  Atenolol 50 Mg  Tabs (Atenolol) .... Take 1/2 Tablet By Mouth Daily 3)  Pepcid 20 Mg  Tabs (Famotidine) .... Take One Tablet By Mouth Two Times A Day As Needed 4)  Adult Aspirin Ec Low Strength 81 Mg  Tbec (Aspirin) .... Take One By Mouth Daily 5)  Niacin 500 Mg Tabs (Niacin) .Marland Kitchen.. 1 Two Times A Day 6)  Ra Saw Palmetto 80 Mg  Caps (Saw Palmetto (Serenoa Repens)) .... Once Daily 7)  Miralax   Powd (Polyethylene Glycol 3350) .... Two Times A Day 8)  Fish Oil Concentrate 1000 Mg  Caps (Omega-3 Fatty Acids) .... Two By Mouth Bid 9)  Calcium 500/d 500-400 Mg-Unit Chew (Calcium-Vitamin D) .... Two Times A Day 10)  Folbee 2.5-25-1 Mg Tabs (Folic Acid-Vit B6-Vit B12) .... One By Mouth Daily 11)  Cyanocobalamin 1000 Mcg/ml Soln (Cyanocobalamin)  .Marland Kitchen.. 1ml Monthly 12)  Naprosyn 500 Mg Tabs (Naproxen) .... 2 Once Daily With Prilosec 13)  Levitra 10 Mg Tabs (Vardenafil Hcl) .Marland Kitchen.. 1 Once Daily As Needed  Current Medications (verified): 1)  Lotensin Hct 10-12.5 Mg  Tabs (Benazepril-Hydrochlorothiazide) .... Take One By Mouth Daily 2)  Atenolol 50 Mg  Tabs (Atenolol) .... Take 1/2 Tablet By Mouth Daily 3)  Pepcid 20 Mg  Tabs (Famotidine) .... Take One Tablet By Mouth Two Times A Day As Needed 4)  Adult Aspirin Ec Low Strength 81 Mg  Tbec (Aspirin) .... Take One By Mouth Daily 5)  Niacin 500  Mg Tabs (Niacin) .Marland Kitchen.. 1 Two Times A Day 6)  Ra Saw Palmetto 80 Mg  Caps (Saw Palmetto (Serenoa Repens)) .... Once Daily 7)  Miralax   Powd (Polyethylene Glycol 3350) .... Two Times A Day 8)  Fish Oil Concentrate 1000 Mg  Caps (Omega-3 Fatty Acids) .... Two By Mouth Bid 9)  Calcium 500/d 500-400 Mg-Unit Chew (Calcium-Vitamin D) .... Two Times A Day 10)  Folbee 2.5-25-1 Mg Tabs (Folic Acid-Vit B6-Vit B12) .... One By Mouth Daily 11)  Cyanocobalamin 1000 Mcg/ml Soln (Cyanocobalamin) .Marland Kitchen.. 1ml Monthly 12)  Levitra 10 Mg Tabs (Vardenafil Hcl) .Marland Kitchen.. 1 Once Daily As Needed 13)  Meloxicam 7.5 Mg Tabs (Meloxicam) .... One By Mouth Daily  Allergies (verified): 1)  ! * Statins  Past History:  Family History: Last updated: 09/22/2006 Family History of Cardiovascular disorder Fam hx CAD Fam hx MI  Social History: Last updated: 12/04/2007 Retired Married Alcohol use-yes wife--deceased with cancer  Risk Factors: Exercise: yes (12/12/2009)  Risk Factors: Smoking Status: never (12/12/2009) Passive Smoke Exposure: no (12/12/2009)  Past medical, surgical, family and social histories (including risk factors) reviewed, and no changes noted (except as noted below).  Past Medical History: Reviewed history from 01/18/2008 and no changes required. GERD Hyperlipidemia Hypertension Osteoarthritis-knees poly myagia rhematica  Past Surgical  History: Reviewed history from 10/24/2006 and no changes required. Colonoscopy-07/30/2005 Tonsillectomy  Family History: Reviewed history from 09/22/2006 and no changes required. Family History of Cardiovascular disorder Fam hx CAD Fam hx MI  Social History: Reviewed history from 12/04/2007 and no changes required. Retired Married Alcohol use-yes wife--deceased with cancer Does Patient Exercise:  yes  Review of Systems  The patient denies anorexia, fever, weight loss, weight gain, vision loss, decreased hearing, hoarseness, chest pain, syncope, dyspnea on exertion, peripheral edema, prolonged cough, headaches, hemoptysis, abdominal pain, melena, hematochezia, severe indigestion/heartburn, hematuria, incontinence, genital sores, muscle weakness, suspicious skin lesions, transient blindness, difficulty walking, depression, unusual weight change, abnormal bleeding, enlarged lymph nodes, angioedema, and breast masses.    Physical Exam  General:  alert and well-developed.   Head:  normocephalic and male-pattern balding.   Eyes:  pupils equal, pupils round, pupils reactive to light, and no injection.   Ears:  R ear normal and L ear normal.   Nose:  no external deformity.   Mouth:  good dentition and pharynx pink and moist.   Neck:  No deformities, masses, or tenderness noted. Lungs:  Normal respiratory effort, chest expands symmetrically. Lungs are clear to auscultation, no crackles or wheezes. Heart:  normal rate and regular rhythm.   Abdomen:  soft, non-tender, and no hepatomegaly.   Msk:  joint tenderness and joint swelling.   Extremities:  trace left pedal edema and trace right pedal edema.  increased varicosities   Neurologic:  alert & oriented X3 and finger-to-nose normal.     Impression & Recommendations:  Problem # 1:  HYPERTENSION (ICD-401.9) Assessment Unchanged  His updated medication list for this problem includes:    Lotensin Hct 10-12.5 Mg Tabs  (Benazepril-hydrochlorothiazide) .Marland Kitchen... Take one by mouth daily    Atenolol 50 Mg Tabs (Atenolol) .Marland Kitchen... Take 1/2 tablet by mouth daily  Orders: Specimen Handling (82956) TLB-BMP (Basic Metabolic Panel-BMET) (80048-METABOL)  BP today: 120/70 Prior BP: 130/70 (11/30/2009)  Prior 10 Yr Risk Heart Disease: Not enough information (12/11/2006)  Labs Reviewed: K+: 3.8 (11/30/2009) Creat: : 0.7 (11/30/2009)   Chol: 239 (06/16/2007)   HDL: 69.5 (06/16/2007)   LDL: DEL (06/16/2007)   TG: 61 (06/16/2007)  Problem # 2:  BACK PAIN (ICD-724.5) Assessment: Deteriorated had a fall and bursed shoulder and  back The following medications were removed from the medication list:    Naprosyn 500 Mg Tabs (Naproxen) .Marland Kitchen... 2 once daily with prilosec His updated medication list for this problem includes:    Adult Aspirin Ec Low Strength 81 Mg Tbec (Aspirin) .Marland Kitchen... Take one by mouth daily    Meloxicam 7.5 Mg Tabs (Meloxicam) ..... One by mouth daily  Discussed use of moist heat or ice, modified activities, medications, and stretching/strengthening exercises. Back care instructions given. To be seen in 2 weeks if no improvement; sooner if worsening of symptoms.   Problem # 3:  PREVENTIVE HEALTH CARE (ICD-V70.0) Assessment: Unchanged  The pt was asked about all immunizations, health maint. services that are appropriate to their age and was given guidance on diet exercize  and weight management  Colonoscopy: Results: Normal. Results: Diverticulosis.       Location:  [facility].   (11/24/2006) Td Booster: Historical (03/26/2003)   Flu Vax: Fluvax MCR (01/12/2007)   Pneumovax: Pneumovax (Medicare) (12/12/2009) Chol: 239 (06/16/2007)   HDL: 69.5 (06/16/2007)   LDL: DEL (06/16/2007)   TG: 61 (06/16/2007) TSH: 3.22 (03/01/2008)   PSA: 1.41 (12/28/2007)  Discussed using sunscreen, use of alcohol, drug use, self testicular exam, routine dental care, routine eye care, routine physical exam, seat belts,  multiple vitamins, osteoporosis prevention, adequate calcium intake in diet, and recommendations for immunizations.  Discussed exercise and checking cholesterol.  Discussed gun safety, safe sex, and contraception. Also recommend checking PSA.  Orders: Medicare -1st Annual Wellness Visit [phone]) Doppler Referral (Doppler)  Problem # 4:  SPECIAL SCREENING MALIGNANT NEOPLASM OF PROSTATE (ICD-V76.44) Assessment: Unchanged  Orders: TLB-PSA (Prostate Specific Antigen) (84153-PSA) Specimen Handling (60454)  Problem # 5:  HYPERLIPIDEMIA (ICD-272.4)  His updated medication list for this problem includes:    Niacin 500 Mg Tabs (Niacin) .Marland Kitchen... 1 two times a day  Orders: Specimen Handling (09811) TLB-Lipid Panel (80061-LIPID)  Labs Reviewed: SGOT: 19 (12/28/2007)   SGPT: 15 (12/28/2007)  Lipid Goals: Chol Goal: 200 (03/24/2007)   HDL Goal: 40 (03/24/2007)   LDL Goal: 160 (03/24/2007)   TG Goal: 150 (03/24/2007)  Prior 10 Yr Risk Heart Disease: Not enough information (12/11/2006)   HDL:69.5 (06/16/2007), 89.1 (02/11/2007)  LDL:DEL (06/16/2007), DEL (02/11/2007)  Chol:239 (06/16/2007), 239 (02/11/2007)  Trig:61 (06/16/2007), 60 (02/11/2007)  Complete Medication List: 1)  Lotensin Hct 10-12.5 Mg Tabs (Benazepril-hydrochlorothiazide) .... Take one by mouth daily 2)  Atenolol 50 Mg Tabs (Atenolol) .... Take 1/2 tablet by mouth daily 3)  Pepcid 20 Mg Tabs (Famotidine) .... Take one tablet by mouth two times a day as needed 4)  Adult Aspirin Ec Low Strength 81 Mg Tbec (Aspirin) .... Take one by mouth daily 5)  Niacin 500 Mg Tabs (Niacin) .Marland Kitchen.. 1 two times a day 6)  Ra Saw Palmetto 80 Mg Caps (Saw palmetto (serenoa repens)) .... Once daily 7)  Miralax Powd (Polyethylene glycol 3350) .... Two times a day 8)  Fish Oil Concentrate 1000 Mg Caps (Omega-3 fatty acids) .... Two by mouth bid 9)  Calcium 500/d 500-400 Mg-unit Chew (Calcium-vitamin d) .... Two times a day 10)  Folbee 2.5-25-1 Mg Tabs (Folic  acid-vit b6-vit b12) .... One by mouth daily 11)  Cyanocobalamin 1000 Mcg/ml Soln (Cyanocobalamin) .Marland Kitchen.. 1ml monthly 12)  Levitra 10 Mg Tabs (Vardenafil hcl) .Marland Kitchen.. 1 once daily as needed 13)  Meloxicam 7.5 Mg Tabs (Meloxicam) .... One by mouth  daily  Other Orders: Vit B12 1000 mcg (J3420) Admin of Therapeutic Inj  intramuscular or subcutaneous (16109) Pneumococcal Vaccine (60454) Admin 1st Vaccine (09811) TLB-CBC Platelet - w/Differential (85025-CBCD) Venipuncture (91478) TLB-Hepatic/Liver Function Pnl (80076-HEPATIC)  Patient Instructions: 1)  Please schedule a follow-up appointment in 2 months. 2)  plan for preventive services given to patient Prescriptions: MELOXICAM 7.5 MG TABS (MELOXICAM) one by mouth daily  #30 x 6   Entered and Authorized by:   Stacie Glaze MD   Signed by:   Stacie Glaze MD on 12/12/2009   Method used:   Electronically to        [facility]* (retail)       [address]       [place], [place]  [id number]       Ph: [phone]       Fax: [phone]   RxID:   [id number]    Medication Administration  Injection # 1:    Medication: Vit B12 1000 mcg    Diagnosis: ANEMIA OF CHRONIC DISEASE (ICD-285.29)    Route: IM    Site: R deltoid    Exp Date: 10/23/2011    Lot #: [id number]    Mfr: American Regent    Patient tolerated injection without complications    Given by: Willy Eddy, LPN (December 12, 2009 11:17 AM)  Orders Added: 1)  Vit B12 1000 mcg [J3420] 2)  Admin of Therapeutic Inj  intramuscular or subcutaneous [96372] 3)  Pneumococcal Vaccine [90732] 4)  Admin 1st Vaccine [90471] 5)  TLB-PSA (Prostate Specific Antigen) [63875-IEP] 6)  TLB-CBC Platelet - w/Differential [85025-CBCD] 7)  Venipuncture [32951] 8)  Medicare -1st Annual Wellness Visit [G0438] 9)  Est. Patient Level III [88416] 10)  Specimen Handling [99000] 11)  Doppler Referral [Doppler] 12)  TLB-Lipid Panel [80061-LIPID] 13)  TLB-Hepatic/Liver Function  Pnl [80076-HEPATIC] 14)  TLB-BMP (Basic Metabolic Panel-BMET) [80048-METABOL]    Immunizations Administered:  Pneumonia Vaccine:    Vaccine Type: Pneumovax (Medicare)    Site: left deltoid    Mfr: Merck    Dose: 0.5 ml    Route: IM    Given by: Willy Eddy, LPN    Exp. Date: 04/15/2011    Lot #: [id number]    VIS given: 02/27/09 version given December 12, 2009.   Prevention & Chronic Care Immunizations   Influenza vaccine: Fluvax MCR  (01/12/2007)   Influenza vaccine deferral: patient declined  (12/12/2009)    Tetanus booster: 03/26/2003: Historical   Tetanus booster due: 03/25/2013    Pneumococcal vaccine: Pneumovax (Medicare)  (12/12/2009)    H. zoster vaccine: Not documented    Immunization comments: has discussed the immunization fr the shingles and he is worryed abiut the pmr  Colorectal Screening   Hemoccult: Not documented    Colonoscopy: Results: Normal. Results: Diverticulosis.       Location:  [facility].    (11/24/2006)  Other Screening   PSA: 1.41  (12/28/2007)   PSA ordered.   PSA action/deferral: Discussed-PSA requested  (12/12/2009)   Smoking status: never  (12/12/2009)  Lipids   Total Cholesterol: 239  (06/16/2007)   Lipid panel action/deferral: Lipid Panel ordered   LDL: DEL  (06/16/2007)   LDL Direct: 149.0  (06/16/2007)   HDL: 69.5  (06/16/2007)   Triglycerides: 61  (06/16/2007)   Lipid panel due: 06/12/2010    SGOT (AST): 19  (12/28/2007)   BMP action: Ordered   SGPT (ALT): 15  (12/28/2007)   Alkaline  phosphatase: 43  (12/28/2007)   Total bilirubin: 0.5  (12/28/2007)   Liver panel due: 06/12/2010    Lipid flowsheet reviewed?: Yes   Progress toward LDL goal: Improved  Hypertension   Last Blood Pressure: 120 / 70  (12/12/2009)   Serum creatinine: 0.7  (11/30/2009)   BMP action: Ordered   Serum potassium 3.8  (11/30/2009)    Hypertension flowsheet reviewed?: Yes   Progress toward BP goal: At  goal  Self-Management Support :    Patient will work on the following items until the next clinic visit to reach self-care goals:     Medications and monitoring: take my medicines every day, check my blood pressure  (12/12/2009)     Eating: eat more vegetables  (12/12/2009)     Activity: take a 30 minute walk every day  (12/12/2009)    Hypertension self-management support: BP self-monitoring log, Education handout, Written self-care plan  (12/12/2009)   Hypertension self-care plan printed.   Hypertension education handout printed    Lipid self-management support: Lipid monitoring log, Education handout, Written self-care plan  (12/12/2009)   Lipid self-care plan printed.   Lipid education handout printed

## 2010-04-26 NOTE — Assessment & Plan Note (Signed)
Summary: b-12 inj/cjr   Nurse Visit   Allergies: 1)  ! * Statins  Medication Administration  Injection # 1:    Medication: Vit B12 1000 mcg    Diagnosis: ANEMIA OF CHRONIC DISEASE (ICD-285.29)    Route: IM    Site: L deltoid    Exp Date: 09/23/2011    Lot #: [id number]    Mfr: American Regent    Patient tolerated injection without complications    Given by: Willy Eddy, LPN (April 03, 2010 12:11 PM)  Orders Added: 1)  Vit B12 1000 mcg [J3420] 2)  Admin of Therapeutic Inj  intramuscular or subcutaneous [34742]

## 2010-05-24 ENCOUNTER — Ambulatory Visit (INDEPENDENT_AMBULATORY_CARE_PROVIDER_SITE_OTHER): Payer: Medicare Other | Admitting: Internal Medicine

## 2010-05-24 DIAGNOSIS — E538 Deficiency of other specified B group vitamins: Secondary | ICD-10-CM

## 2010-05-24 MED ORDER — CYANOCOBALAMIN 1000 MCG/ML IJ SOLN
1000.0000 ug | INTRAMUSCULAR | Status: DC
  Administered 2010-05-24: 1000 ug via INTRAMUSCULAR

## 2010-06-07 LAB — BASIC METABOLIC PANEL
BUN: 16 mg/dL (ref 6–23)
CO2: 28 mEq/L (ref 19–32)
Creatinine, Ser: 0.73 mg/dL (ref 0.4–1.5)
GFR calc Af Amer: >60 mL/min (ref >60)
GFR calc non Af Amer: >60 mL/min (ref >60)
Potassium: 4.4 mEq/L (ref 3.5–5.1)
Sodium: 131 mEq/L — ABNORMAL LOW (ref 135–145)

## 2010-06-19 ENCOUNTER — Encounter: Payer: Self-pay | Admitting: Internal Medicine

## 2010-06-20 ENCOUNTER — Encounter: Payer: Self-pay | Admitting: Internal Medicine

## 2010-06-20 ENCOUNTER — Ambulatory Visit (INDEPENDENT_AMBULATORY_CARE_PROVIDER_SITE_OTHER): Payer: Medicare Other | Admitting: Internal Medicine

## 2010-06-20 VITALS — BP 118/62 | HR 68 | Temp 98.2°F | Resp 14 | Ht 73.0 in | Wt 195.0 lb

## 2010-06-20 DIAGNOSIS — M1710 Unilateral primary osteoarthritis, unspecified knee: Secondary | ICD-10-CM | POA: Insufficient documentation

## 2010-06-20 DIAGNOSIS — M720 Palmar fascial fibromatosis [Dupuytren]: Secondary | ICD-10-CM

## 2010-06-20 DIAGNOSIS — I8310 Varicose veins of unspecified lower extremity with inflammation: Secondary | ICD-10-CM

## 2010-06-20 DIAGNOSIS — D638 Anemia in other chronic diseases classified elsewhere: Secondary | ICD-10-CM

## 2010-06-20 DIAGNOSIS — E538 Deficiency of other specified B group vitamins: Secondary | ICD-10-CM

## 2010-06-20 DIAGNOSIS — M353 Polymyalgia rheumatica: Secondary | ICD-10-CM

## 2010-06-20 DIAGNOSIS — I10 Essential (primary) hypertension: Secondary | ICD-10-CM

## 2010-06-20 LAB — CBC WITH DIFFERENTIAL/PLATELET
Eosinophils Relative: 2.8 % (ref 0.0–5.0)
HCT: 41.1 % (ref 39.0–52.0)
Hemoglobin: 14 g/dL (ref 13.0–17.0)
Lymphocytes Relative: 20.9 % (ref 12.0–46.0)
Lymphs Abs: 1.2 10*3/uL (ref 0.7–4.0)
Monocytes Relative: 14 % — ABNORMAL HIGH (ref 3.0–12.0)
Neutro Abs: 3.5 10*3/uL (ref 1.4–7.7)
RBC: 4.3 Mil/uL (ref 4.22–5.81)
WBC: 5.7 10*3/uL (ref 4.5–10.5)

## 2010-06-20 LAB — SEDIMENTATION RATE: Sed Rate: 21 mm/hr (ref 0–22)

## 2010-06-20 MED ORDER — CYANOCOBALAMIN 1000 MCG/ML IJ SOLN
1000.0000 ug | INTRAMUSCULAR | Status: DC
  Administered 2010-06-20: 1000 ug via INTRAMUSCULAR

## 2010-06-20 NOTE — Assessment & Plan Note (Signed)
The patient has persistent osteoarthritis in his right knee he has had injections in the past we will do a knee injection today per his request as appropriate.  The patient gave informed consent knee were prepped with Betadine anesthesia we obtained topically with lidocaine spray then 40 mg of Depo-Medrol and 1/2 cc of 2% lidocaine was injected into the knee joint. This was the right knee

## 2010-06-20 NOTE — Assessment & Plan Note (Addendum)
Residual hand pain in the right hand from the surgery so has not opted for left surgery There is an arthritic component that complicates the picture

## 2010-06-20 NOTE — Assessment & Plan Note (Signed)
Stable blood pressure 

## 2010-06-20 NOTE — Assessment & Plan Note (Signed)
needs to have a sed rate checked

## 2010-06-20 NOTE — Progress Notes (Signed)
  Subjective:    Patient ID: Darin [name], male    DOB: 01-29-1932, 77 y.o.   MRN: [id number]  HPI Presents for increased pain in right knee. Monitoring of PMR, HTN and anemia Stable except for knee pain   Review of Systems  Constitutional: Negative for fever and fatigue.  HENT: Negative for hearing loss, congestion, neck pain and postnasal drip.   Eyes: Negative for discharge, redness and visual disturbance.  Respiratory: Negative for cough, shortness of breath and wheezing.   Cardiovascular: Negative for leg swelling.  Gastrointestinal: Negative for abdominal pain, constipation and abdominal distention.  Genitourinary: Negative for urgency and frequency.  Musculoskeletal: Negative for joint swelling and arthralgias.  Skin: Negative for color change and rash.  Neurological: Negative for weakness and light-headedness.  Hematological: Negative for adenopathy.  Psychiatric/Behavioral: Negative for behavioral problems.       Past Medical History  Diagnosis Date  . GERD (gastroesophageal reflux disease)   . Hyperlipidemia   . Hypertension   . Arthritis   . PMR (polymyalgia rheumatica)    Past Surgical History  Procedure Date  . Tonsillectomy     reports that he has never smoked. He does not have any smokeless tobacco history on file. He reports that he drinks alcohol. He reports that he does not use illicit drugs. family history includes Coronary artery disease in an unspecified family member. Allergies  Allergen Reactions  . Statins     Objective:   Physical Exam  [nursing notereviewed. Constitutional: He appears well-developed and well-nourished.  HENT:  Head: Normocephalic and atraumatic.  Eyes: Conjunctivae are normal. Pupils are equal, round, and reactive to light.  Neck: Normal range of motion. Neck supple.  Cardiovascular: Normal rate and regular rhythm.   Pulmonary/Chest: Effort normal and breath sounds normal.  Abdominal: Soft. Bowel sounds are normal.    Musculoskeletal: He exhibits tenderness.       Right knee effusion          Assessment & Plan:

## 2010-06-20 NOTE — Assessment & Plan Note (Signed)
The pt went to interventional radiology for venous treatment, he did not get along with the interventional radiologist. The pt will need to be referred to [facility] He did increase the strength of the hose to 20/30

## 2010-06-20 NOTE — Assessment & Plan Note (Signed)
Due CBC

## 2010-07-23 ENCOUNTER — Telehealth: Payer: Self-pay | Admitting: Internal Medicine

## 2010-07-23 ENCOUNTER — Ambulatory Visit (INDEPENDENT_AMBULATORY_CARE_PROVIDER_SITE_OTHER): Payer: Medicare Other | Admitting: Internal Medicine

## 2010-07-23 DIAGNOSIS — E538 Deficiency of other specified B group vitamins: Secondary | ICD-10-CM

## 2010-07-23 MED ORDER — FAMOTIDINE 20 MG PO TABS: 20.0000 mg | ORAL_TABLET | Freq: Two times a day (BID) | ORAL | Status: DC

## 2010-07-23 MED ORDER — CYANOCOBALAMIN 1000 MCG/ML IJ SOLN
1000.0000 ug | Freq: Once | INTRAMUSCULAR | Status: AC
  Administered 2010-07-23: 1000 ug via INTRAMUSCULAR

## 2010-07-23 NOTE — Telephone Encounter (Signed)
Pt cx his fup appt with Dr Lovell Sheehan on 07/30/10. Pt is wanting to know when Dr Lovell Sheehan would like the pt to come back in for fup?

## 2010-07-23 NOTE — Telephone Encounter (Signed)
pleas3e tell pt he need rov in 3 months around the end of June or first of july

## 2010-07-25 ENCOUNTER — Encounter (INDEPENDENT_AMBULATORY_CARE_PROVIDER_SITE_OTHER): Payer: Medicare Other | Admitting: Vascular Surgery

## 2010-07-25 ENCOUNTER — Encounter (INDEPENDENT_AMBULATORY_CARE_PROVIDER_SITE_OTHER): Payer: Medicare Other

## 2010-07-25 DIAGNOSIS — I83893 Varicose veins of bilateral lower extremities with other complications: Secondary | ICD-10-CM

## 2010-07-26 NOTE — Consult Note (Signed)
NEW PATIENT CONSULTATION  Darin [name], Darin [name] DOB:  1931/03/31                                       07/25/2010 CHART#:[id number]  Patient presents today for evaluation of severe bilateral lower extremity venous hypertension and varicose veins.  He is a very pleasant 77 year old gentleman with progressive bilateral varicosities, much more so on the left leg than on the right.  He has had recent difficulty with swelling, aching pain, especially with prolonged standing.  He has been extremely diligent with thigh-high 20-30 mmHg compression garments and has continued to have difficulty despite this.  He also has a history of peripheral neuropathy.  He does not have any history of DVT and no history of bleeding from his varicosities.  PAST MEDICAL HISTORY:  Significant for well-controlled hypertension, arthritis, peripheral neuropathy, and diverticulitis.  PAST SURGICAL HISTORY:  Cataract surgery, carpal tunnel release.  SOCIAL HISTORY:  He is widowed, retired.  He does not smoke or drink alcohol.  FAMILY HISTORY:  Significant for venous varicosities and myocardial infarction in his father.  REVIEW OF SYSTEMS:  No weight loss or gain.  He weighs 180 pounds.  He is 6 feet 1 inches tall. VASCULAR:  Positive for pain in his legs with walking and lying flat. GI:  Hiatal hernia. MUSCULOSKELETAL:  Arthritis, otherwise review of systems negative.  PHYSICAL EXAMINATION:  A well-developed and well-nourished white male appearing his stated age in no acute distress.  Blood pressure is 138/88, pulse 72, respirations 16.  HEENT is normal.  His radial and posterior tibial pulses are 2+ bilaterally.  Musculoskeletal shows no major deformity or cyanosis.  Neurologic:  No focal weakness or paresthesias.  Skin without ulcers or rashes.  He does have marked varicosities, most particularly over his anterior left thigh and left lateral knee onto his calf and also in his right  calf.  He underwent formal venous duplex in our office today, and I have reviewed this with patient.  This does show reflux in his great saphenous vein bilaterally on the left side.  He also has reflux in an anterior branch that is extending directly into these very large varicosities over his anterior thigh.  He has insignificant reflux in the deep system.  I discussed this at length with patient.  He does have extensive venous varicosities related to superficial hypertension.  He is having Pierce Biagini changes of venous stasis changes over his right medial anterior ankle. He does have pain with prolonged standing and pain, particularly over the large varices.  I explained the option of a staged bilateral laser ablation for symptom relief.  On the left, I would recommend ablation of his anterior saphenous as well.  He reports that the left side is most symptomatic; therefore, we would recommend laser ablation of the anterior saphenous and great saphenous on the left, followed by staged right leg great saphenous ablation.  We will proceed with this at his convenience.    [name], M.D. Electronically Signed  TFE/MEDQ  D:  07/25/2010  T:  07/26/2010  Job:  [id number]  cc:   Stacie Glaze, MD

## 2010-07-30 ENCOUNTER — Ambulatory Visit: Payer: Medicare Other | Admitting: Internal Medicine

## 2010-08-07 NOTE — Assessment & Plan Note (Signed)
[facility]                         GASTROENTEROLOGY OFFICE NOTE   Darin [name], Darin [name]                       MRN:          [id number]  DATE:04/16/2007                            DOB:          [birth year]    REFERRING PHYSICIAN:  Stacie Glaze, MD   REASON FOR CONSULT:  Constipation.   HISTORY OF PRESENT ILLNESS:  Darin [name] is a 77 year old white male  who I previously evaluated for constipation, a change in bowel habits  and adenomatous colon polyps.  Adenomatous colon polyps were initially  diagnosed in October, 2001.  He underwent colonoscopy in May of 2007  which showed diverticulosis and internal hemorrhoids.  He relates a  recent worsening in constipation associated with lower abdominal  discomfort and increased gas.  He has noted straining.  He has noted  intermittent thinner stools and intermittent normal caliber stools.  He  notes no rectal bleeding or weight loss.  He was advised to begin the  regular use of MiraLax by Dr. Darryll [name] and he has had complete  resolution of all his symptoms.   PAST MEDICAL HISTORY:  1. GERD.  2. Diverticulosis.  3. Hemorrhoids.  4. Adenomatous colon polyps - initial diagnosis October, 2001.  5. Hyperlipidemia.  6. Hypertension.  7. Osteoarthritis - both knees.  8. Status post tonsillectomy.   CURRENT MEDICATIONS:  Listed on the chart, updated and reviewed.   MEDICATION ALLERGIES:  STATIN DRUGS LEADING TO MUSCLE PAIN AND A RASH.   SOCIAL HISTORY AND REVIEW OF SYSTEMS:  Per the handwritten form.   PHYSICAL EXAM:  Well-developed, well-nourished, no acute distress.  Height 6 feet 1 inch, weight 185.2 pounds, blood pressure is 138/60,  pulse 64 and regular.  HEENT EXAM:  Anicteric sclera, oropharynx clear.  CHEST:  Clear to auscultation bilaterally.  CARDIAC:  Regular rate and rhythm without murmurs appreciated.  ABDOMEN:  Soft, nontender, nondistended, normoactive bowel sounds, no  palpable  organomegaly, masses or hernias.  NEUROLOGIC:  Alert and oriented x3.  Grossly nonfocal.   ASSESSMENT AND PLAN:  1. Constipation.  He was advised to maintain a high fiber diet with 6      to 8 glasses of water a day.  He may use MiraLax between daily and      twice a day as needed for adequate bowel movements.  2. Personal history of adenomatous colon polyps.  Surveillance      colonoscopy due in May, 2012.     Venita Lick. [name], MD, [facility]  Electronically Signed    MTS/MedQ  DD: 04/16/2007  DT: 04/16/2007  Job #: [id number]   cc:   Stacie Glaze, MD

## 2010-08-08 ENCOUNTER — Other Ambulatory Visit (INDEPENDENT_AMBULATORY_CARE_PROVIDER_SITE_OTHER): Payer: Medicare Other | Admitting: Vascular Surgery

## 2010-08-08 DIAGNOSIS — I83893 Varicose veins of bilateral lower extremities with other complications: Secondary | ICD-10-CM

## 2010-08-08 NOTE — Assessment & Plan Note (Signed)
OFFICE VISIT  Darin [name], Darin [name] DOB:  [birth year]                                       08/08/2010 CHART#:[id number]  Patient presents today for treatment of his left leg venous hypertension.  He underwent laser ablation of his left great saphenous vein from the level of the knee to just below the saphenofemoral junction.  He had multiple branches of this and did have a large branch that arose anteriorly off the saphenous vein, feeding varices in his anterior thigh.  I had considered ablation of this anterior branch as well.  On further visualization of this, there was a very short, straight segment.  This did arise off the treated portion of the great saphenous vein.  Therefore, I elected not to treat this with laser. He will have closure of the proximal portion of this vein with the procedure done today.  Hopefully this will give him relief.  He does have multiple tributaries over his anterior thigh and calf and understands that he may require stab phlebectomies with a staged procedure should he have persistent difficulty.  We will see him again in 1 week for follow-up with noninvasive ultrasound.    [name], M.D. Electronically Signed  TFE/MEDQ  D:  08/08/2010  T:  08/08/2010  Job:  [id number]  cc:   Stacie Glaze, MD

## 2010-08-09 NOTE — Procedures (Unsigned)
LOWER EXTREMITY VENOUS REFLUX EXAM  INDICATION:  Edema.  EXAM:  Using color-flow imaging and pulse Doppler spectral analysis, the bilateral common femoral, femoral, popliteal, posterior tibial, great and small saphenous veins were evaluated.  There is evidence suggesting deep venous insufficiency of >500 milliseconds noted in the bilateral common femoral and popliteal veins.  The bilateral saphenofemoral junctions and nontortuous greater saphenous veins demonstrate reflux of >500 milliseconds.  The bilaterally proximal small saphenous veins demonstrate competency.  GSV Diameter (used if found to be incompetent only)                                                   Right      Left Proximal Greater Saphenous Vein                   0.56 cm    0.48 cm Proximal-to-mid-thigh Mid thigh                                         0.51 cm    0.33 cm Mid-distal thigh Distal thigh                                      0.51 cm    0.34 cm Knee                                              0.36 cm    0.31 cm  IMPRESSION: 1. Bilateral greater saphenous vein reflux is noted as described     above. 2. Deep system reflux is noted as described above. 3. The bilateral small saphenous veins are competent.  ___________________________________________ [name], M.D.  CH/MEDQ  D:  07/27/2010  T:  07/27/2010  Job:  [id number]

## 2010-08-14 ENCOUNTER — Other Ambulatory Visit: Payer: Self-pay | Admitting: *Deleted

## 2010-08-14 MED ORDER — BENAZEPRIL-HYDROCHLOROTHIAZIDE 10-12.5 MG PO TABS: 1.0000 | ORAL_TABLET | Freq: Every day | ORAL | Status: DC

## 2010-08-15 ENCOUNTER — Encounter (INDEPENDENT_AMBULATORY_CARE_PROVIDER_SITE_OTHER): Payer: Medicare Other

## 2010-08-15 ENCOUNTER — Ambulatory Visit (INDEPENDENT_AMBULATORY_CARE_PROVIDER_SITE_OTHER): Payer: Medicare Other | Admitting: Vascular Surgery

## 2010-08-15 DIAGNOSIS — I87399 Chronic venous hypertension (idiopathic) with other complications of unspecified lower extremity: Secondary | ICD-10-CM

## 2010-08-15 DIAGNOSIS — I83893 Varicose veins of bilateral lower extremities with other complications: Secondary | ICD-10-CM

## 2010-08-15 DIAGNOSIS — Z48812 Encounter for surgical aftercare following surgery on the circulatory system: Secondary | ICD-10-CM

## 2010-08-15 NOTE — Assessment & Plan Note (Signed)
OFFICE VISIT  Darin [name], Darin [name] DOB:  1931/11/14                                       08/15/2010 CHART#:[id number]  Patient presents today 1 week follow-up of laser ablation of great saphenous vein on the left from the knee to the saphenofemoral junction. He has the usual amount of mild bruising and mild tenderness associated with this.  He did undergo a repeat duplex today which shows closure of his great saphenous vein from the knee to just below the saphenofemoral junction. He does have extensive varicosities over the anterior thigh and understands that he may require a staged phlebectomy of these if he does not have adequate relief.  We will plan to see him again in July, at which time he will undergo a similar ablation treatment in his right leg.    [name], M.D. Electronically Signed  TFE/MEDQ  D:  08/15/2010  T:  08/15/2010  Job:  [id number]  cc:   Stacie Glaze, MD

## 2010-08-22 NOTE — Procedures (Unsigned)
DUPLEX DEEP VENOUS EXAM - LOWER EXTREMITY  INDICATION:  Left greater saphenous vein ELAS.  HISTORY:  Edema:  No. Trauma/Surgery:  Left greater saphenous vein ELAS on 08/08/2010. Pain:  Yes. PE:  No. Previous DVT:  No. Anticoagulants: Other:  DUPLEX EXAM:               CFV   SFV   PopV  PTV    GSV               R  L  R  L  R  L  R   L  R  L Thrombosis    o  o     o     o      o     + Spontaneous   +  +     +     +      +     o Phasic        +  +     +     +      +     o Augmentation  +  +     +     +      +     o Compressible  +  +     +     +      +     o Competent     o  o     +     o      +     o  Legend:  + - yes  o - no  p - partial  D - decreased  IMPRESSION: 1. No evidence of deep venous thrombosis noted in the left lower     extremity. 2. The left greater saphenous vein appears to be totally occluded from     the distal insertion site to near the groin region. 3. Reflux of >500 milliseconds noted in the bilateral common femoral     veins, left popliteal vein, and the left medial accessory saphenous     vein.   _____________________________ [name], M.D.  CH/MEDQ  D:  08/16/2010  T:  08/16/2010  Job:  [id number]

## 2010-08-24 ENCOUNTER — Ambulatory Visit (INDEPENDENT_AMBULATORY_CARE_PROVIDER_SITE_OTHER): Payer: Medicare Other | Admitting: Internal Medicine

## 2010-08-24 DIAGNOSIS — D519 Vitamin B12 deficiency anemia, unspecified: Secondary | ICD-10-CM

## 2010-08-24 DIAGNOSIS — D518 Other vitamin B12 deficiency anemias: Secondary | ICD-10-CM

## 2010-08-24 MED ORDER — CYANOCOBALAMIN 1000 MCG/ML IJ SOLN
1000.0000 ug | Freq: Once | INTRAMUSCULAR | Status: AC
  Administered 2010-08-24: 1000 ug via INTRAMUSCULAR

## 2010-09-17 ENCOUNTER — Encounter: Payer: Self-pay | Admitting: Internal Medicine

## 2010-09-17 ENCOUNTER — Ambulatory Visit (INDEPENDENT_AMBULATORY_CARE_PROVIDER_SITE_OTHER): Payer: Medicare Other | Admitting: Internal Medicine

## 2010-09-17 VITALS — BP 128/70 | HR 72 | Temp 98.2°F | Resp 16 | Ht 73.0 in | Wt 192.0 lb

## 2010-09-17 DIAGNOSIS — G63 Polyneuropathy in diseases classified elsewhere: Secondary | ICD-10-CM

## 2010-09-17 DIAGNOSIS — R55 Syncope and collapse: Secondary | ICD-10-CM

## 2010-09-17 DIAGNOSIS — D519 Vitamin B12 deficiency anemia, unspecified: Secondary | ICD-10-CM

## 2010-09-17 DIAGNOSIS — I10 Essential (primary) hypertension: Secondary | ICD-10-CM

## 2010-09-17 DIAGNOSIS — I8310 Varicose veins of unspecified lower extremity with inflammation: Secondary | ICD-10-CM

## 2010-09-17 DIAGNOSIS — M1990 Unspecified osteoarthritis, unspecified site: Secondary | ICD-10-CM

## 2010-09-17 DIAGNOSIS — D518 Other vitamin B12 deficiency anemias: Secondary | ICD-10-CM

## 2010-09-17 LAB — CBC WITH DIFFERENTIAL/PLATELET
Basophils Absolute: 0 10*3/uL (ref 0.0–0.1)
Eosinophils Relative: 1.6 % (ref 0.0–5.0)
HCT: 38 % — ABNORMAL LOW (ref 39.0–52.0)
Hemoglobin: 13.1 g/dL (ref 13.0–17.0)
Lymphs Abs: 1 10*3/uL (ref 0.7–4.0)
MCV: 96.7 fl (ref 78.0–100.0)
Monocytes Absolute: 0.9 10*3/uL (ref 0.1–1.0)
Neutro Abs: 5.1 10*3/uL (ref 1.4–7.7)
Platelets: 177 10*3/uL (ref 150.0–400.0)
RDW: 13.9 % (ref 11.5–14.6)

## 2010-09-17 LAB — SEDIMENTATION RATE: Sed Rate: 28 mm/hr — ABNORMAL HIGH (ref 0–22)

## 2010-09-17 MED ORDER — VARDENAFIL HCL 20 MG PO TABS: 20.0000 mg | ORAL_TABLET | Freq: Every day | ORAL | Status: DC | PRN

## 2010-09-17 MED ORDER — CYANOCOBALAMIN 1000 MCG PO TABS: 1000.0000 ug | ORAL_TABLET | Freq: Every day | ORAL | Status: AC

## 2010-09-17 NOTE — Progress Notes (Signed)
  Subjective:    Patient ID: Darin [name], male    DOB: 05-29-1931, 77 y.o.   MRN: [id number]  HPI Chief complaint today of an episode of near syncope. He was lightheaded after vision but denies any chest pain and no shortness of breath. Is a 77 year old white male with a history of hypertension and hyperlipidemia also a history of polymyalgia rheumatica.  He has done generally well off prednisone now to any new symptoms indicating recurrent polymyalgia.  He does have a persistent polyneuropathy present in his lower extremity some problems with gait due to proprioceptive loss  Review of Systems  Constitutional: Negative for fever and fatigue.  HENT: Negative for hearing loss, congestion, neck pain and postnasal drip.   Eyes: Negative for discharge, redness and visual disturbance.  Respiratory: Negative for cough, shortness of breath and wheezing.   Cardiovascular: Negative for leg swelling.  Gastrointestinal: Negative for abdominal pain, constipation and abdominal distention.  Genitourinary: Negative for urgency and frequency.  Musculoskeletal: Negative for joint swelling and arthralgias.  Skin: Negative for color change and rash.  Neurological: Negative for weakness and light-headedness.  Hematological: Negative for adenopathy.  Psychiatric/Behavioral: Negative for behavioral problems.   Past Medical History  Diagnosis Date  . GERD (gastroesophageal reflux disease)   . Hyperlipidemia   . Hypertension   . Arthritis   . PMR (polymyalgia rheumatica)    Past Surgical History  Procedure Date  . Tonsillectomy     reports that he has never smoked. He does not have any smokeless tobacco history on file. He reports that he drinks alcohol. He reports that he does not use illicit drugs. family history includes Coronary artery disease in an unspecified family member. Allergies  Allergen Reactions  . Statins        Objective:   Physical Exam  Nursing note and vitals  reviewed. Constitutional: He appears well-developed and well-nourished.       Elderly WM in NAD  HENT:  Head: Normocephalic and atraumatic.  Eyes: Conjunctivae are normal. Pupils are equal, round, and reactive to light.  Neck: Normal range of motion. Neck supple.  Cardiovascular: Normal rate and regular rhythm.   Pulmonary/Chest: Effort normal and breath sounds normal.  Abdominal: Soft. Bowel sounds are normal.        Extremity with decreased light touch and proprioception  Assessment & Plan:  Polyneuropathy will be treated with B12 injections and oral B12 sublingual 500 mg twice a day with monitoring in 2-3 months.  Blood pressure stable continue current hypertensive therapy Patient is intolerant of statins Should monitor his blood pressure carefully to make sure he did not have a hypotensive event developed anemia she develops palpitations chest discomfort he would notify our office immediately and if

## 2010-10-03 ENCOUNTER — Other Ambulatory Visit (INDEPENDENT_AMBULATORY_CARE_PROVIDER_SITE_OTHER): Payer: Medicare Other | Admitting: Vascular Surgery

## 2010-10-03 DIAGNOSIS — I83893 Varicose veins of bilateral lower extremities with other complications: Secondary | ICD-10-CM

## 2010-10-03 NOTE — Assessment & Plan Note (Signed)
OFFICE VISIT  Darin [name], Darin [name] DOB:  06/25/1931                                       10/03/2010 CHART#:[id number]  Patient presents today for treatment of right venous hypertension.  He underwent laser ablation of his right great saphenous vein from the proximal calf to just below the saphenofemoral junction.  This was under local anesthetic in our office with no immediate complication.  He was discharged home and will be seen again in 1 week with repeat duplex evaluation.    [name], M.D. Electronically Signed  TFE/MEDQ  D:  10/03/2010  T:  10/03/2010  Job:  [id number]  cc:   Stacie Glaze, MD

## 2010-10-10 ENCOUNTER — Encounter (INDEPENDENT_AMBULATORY_CARE_PROVIDER_SITE_OTHER): Payer: Medicare Other

## 2010-10-10 ENCOUNTER — Ambulatory Visit (INDEPENDENT_AMBULATORY_CARE_PROVIDER_SITE_OTHER): Payer: Medicare Other | Admitting: Vascular Surgery

## 2010-10-10 DIAGNOSIS — I83893 Varicose veins of bilateral lower extremities with other complications: Secondary | ICD-10-CM

## 2010-10-10 DIAGNOSIS — Z48812 Encounter for surgical aftercare following surgery on the circulatory system: Secondary | ICD-10-CM

## 2010-10-10 DIAGNOSIS — I8310 Varicose veins of unspecified lower extremity with inflammation: Secondary | ICD-10-CM

## 2010-10-11 NOTE — Assessment & Plan Note (Signed)
OFFICE VISIT  Darin [name], Darin [name] DOB:  1931-04-13                                       10/10/2010 CHART#:[id number]  The patient presents today for A 1-week followup of laser ablation of his right great saphenous vein.  He has the usual amount of discomfort over the medial thigh with mild bruising.  He underwent venous duplex today and I reviewed this with him.  PHYSICAL EXAMINATION:  Vital signs:  Blood pressure 136/53, heart rate 63, respirations 18.  He underwent ultrasound which shows occlusion of his great saphenous vein from the distal insertion site below his knee up to the saphenofemoral junction with no evidence of DVT.  I am pleased with his initial result as is the patient.  We plan to see him again on an as-needed basis and he is to notify us should he have any persistent swelling or other difficulties.  He does continue to have some swelling in his left leg after left leg ablation.  He does have known bilateral.  Deep venous reflux and understands the importance of continued elevation and compression garment for control of this.    [name], M.D. Electronically Signed  TFE/MEDQ  D:  10/10/2010  T:  10/11/2010  Job:  [id number]  cc:   Stacie Glaze, MD

## 2010-10-24 NOTE — Procedures (Unsigned)
DUPLEX DEEP VENOUS EXAM - LOWER EXTREMITY  INDICATION:  Follow up ablation.  HISTORY:  Edema:  No. Trauma/Surgery:  Right great saphenous vein ablation, 10/03/2010. Pain:  Yes. PE:  No. Previous DVT:  No. Anticoagulants:  No. Other:  DUPLEX EXAM:               CFV   SFV   PopV  PTV    GSV               R  L  R  L  R  L  R   L  R  L Thrombosis    o  o  o     o     o      + Spontaneous   +  +  +     +     +      o Phasic        +  +  +     +     +      o Augmentation  +  +  +     +     +      o Compressible  +  +  +     +     +      o Competent     o  o  +     o     +      o  Legend:  + - yes  o - no  p - partial  D - decreased  IMPRESSION: 1. No evidence of deep venous thrombosis in the right lower extremity. 2. The great saphenous vein appears occluded from the distal insertion     site to the saphenofemoral junction.   _____________________________ [name], M.D.  EM/MEDQ  D:  10/10/2010  T:  10/11/2010  Job:  [id number]

## 2010-12-17 ENCOUNTER — Telehealth: Payer: Self-pay | Admitting: Internal Medicine

## 2010-12-17 NOTE — Telephone Encounter (Signed)
Pt was sch to come in on 12/19/10 but had to rsc. Pt fell on Saturday 9/22 and gashed back of head open. Pt did not got to ER or see a doctor. Pt had passed out before he fell. Pt is req work in ov sooner. Pt refuses to see another doctor.

## 2010-12-17 NOTE — Telephone Encounter (Signed)
Offered pt an appointment on Wednesday and he refused.  REFUSED TO SEE ANOTHER MD , in structed to call if cut looks infected

## 2010-12-19 ENCOUNTER — Ambulatory Visit: Payer: Medicare Other | Admitting: Internal Medicine

## 2011-01-09 ENCOUNTER — Other Ambulatory Visit: Payer: Self-pay | Admitting: *Deleted

## 2011-01-09 MED ORDER — MELOXICAM 7.5 MG PO TABS: 7.5000 mg | ORAL_TABLET | Freq: Every day | ORAL | Status: DC

## 2011-01-29 ENCOUNTER — Encounter: Payer: Self-pay | Admitting: Internal Medicine

## 2011-01-29 ENCOUNTER — Ambulatory Visit (INDEPENDENT_AMBULATORY_CARE_PROVIDER_SITE_OTHER): Payer: Medicare Other | Admitting: Internal Medicine

## 2011-01-29 VITALS — BP 110/60 | HR 56 | Temp 98.0°F | Resp 14 | Ht 73.0 in | Wt 198.0 lb

## 2011-01-29 DIAGNOSIS — A5211 Tabes dorsalis: Secondary | ICD-10-CM

## 2011-01-29 DIAGNOSIS — D519 Vitamin B12 deficiency anemia, unspecified: Secondary | ICD-10-CM

## 2011-01-29 DIAGNOSIS — D518 Other vitamin B12 deficiency anemias: Secondary | ICD-10-CM

## 2011-01-29 DIAGNOSIS — M1460 Charcot's joint, unspecified site: Secondary | ICD-10-CM

## 2011-01-29 LAB — VITAMIN B12: Vitamin B-12: 1329 pg/mL — ABNORMAL HIGH (ref 211–911)

## 2011-01-29 LAB — SEDIMENTATION RATE: Sed Rate: 27 mm/hr — ABNORMAL HIGH (ref 0–22)

## 2011-01-29 MED ORDER — CYANOCOBALAMIN 1000 MCG/ML IJ SOLN
1000.0000 ug | INTRAMUSCULAR | Status: DC
  Administered 2011-01-29: 1000 ug via INTRAMUSCULAR

## 2011-01-29 MED ORDER — PREGABALIN 50 MG PO CAPS: 50.0000 mg | ORAL_CAPSULE | Freq: Three times a day (TID) | ORAL | Status: DC

## 2011-01-29 NOTE — Patient Instructions (Signed)
Patient was instructed to continue all medications as prescribed. To stop at the checkout desk and schedule a followup appointment  

## 2011-01-29 NOTE — Progress Notes (Signed)
  Subjective:    Patient ID: Darin [name], male    DOB: April 04, 1931, 77 y.o.   MRN: [id number]  HPI This is a 77 year old white male presents for followup of hypertension hyperlipidemia history of gastroesophageal reflux and history of polymyalgia rheumatica.  He presents today with increased musculoskeletal pain primarily in his knees hips and hands with a history of osteoarthritis as well as a history of polymyalgia rheumatica.  His weight has been stable he is afebrile he does have some back pain is also associated with his chronic arthritic complaints.  He denies any chest pain shortness of breath PND orthopnea fever or chills   Review of Systems  Constitutional: Negative for fever and fatigue.  HENT: Negative for hearing loss, congestion, neck pain and postnasal drip.   Eyes: Negative for discharge, redness and visual disturbance.  Respiratory: Negative for cough, shortness of breath and wheezing.   Cardiovascular: Negative for leg swelling.  Gastrointestinal: Negative for abdominal pain, constipation and abdominal distention.  Genitourinary: Negative for urgency and frequency.  Musculoskeletal: Negative for joint swelling and arthralgias.  Skin: Negative for color change and rash.  Neurological: Negative for weakness and light-headedness.  Hematological: Negative for adenopathy.  Psychiatric/Behavioral: Negative for behavioral problems.   Past Medical History  Diagnosis Date  . GERD (gastroesophageal reflux disease)   . Hyperlipidemia   . Hypertension   . Arthritis   . PMR (polymyalgia rheumatica)    Past Surgical History  Procedure Date  . Tonsillectomy     reports that he has never smoked. He does not have any smokeless tobacco history on file. He reports that he drinks alcohol. He reports that he does not use illicit drugs. family history includes Coronary artery disease in an unspecified family member. Allergies  Allergen Reactions  . Statins        Objective:     Physical Exam  Nursing note and vitals reviewed. Constitutional: He appears well-developed and well-nourished.  HENT:  Head: Normocephalic and atraumatic.  Eyes: Conjunctivae are normal. Pupils are equal, round, and reactive to light.  Neck: Normal range of motion. Neck supple.  Cardiovascular: Normal rate and regular rhythm.   Pulmonary/Chest: Effort normal and breath sounds normal.  Abdominal: Soft. Bowel sounds are normal.          Assessment & Plan:  We will validate this patient for alternative causes for his arthritic pain and back pain including a serum electrophoresis sedimentation rate and a CBC with differential.  We'll begin a trial of Lyrica 50 mg by mouth twice a day for pain control A B12 level will be drawn also for peripheral neuropathy and B12 injection will be given after the B12 levels drawn

## 2011-01-31 LAB — PROTEIN ELECTROPHORESIS, SERUM: Total Protein, Serum Electrophoresis: 7.2 g/dL (ref 6.0–8.3)

## 2011-02-15 ENCOUNTER — Encounter: Payer: Self-pay | Admitting: Internal Medicine

## 2011-02-21 ENCOUNTER — Telehealth: Payer: Self-pay | Admitting: Family Medicine

## 2011-02-21 MED ORDER — GABAPENTIN 300 MG PO CAPS: 300.0000 mg | ORAL_CAPSULE | Freq: Three times a day (TID) | ORAL | Status: DC

## 2011-02-21 NOTE — Telephone Encounter (Signed)
Darin [name] was denied, stating "there is no documented trial of gabapentin immediate release or Neurotin." Please advise.

## 2011-02-21 NOTE — Telephone Encounter (Signed)
Please advise 

## 2011-02-21 NOTE — Telephone Encounter (Signed)
Let him try neurontin 300 tid and see if he can tolerate it

## 2011-02-21 NOTE — Telephone Encounter (Signed)
rx sent

## 2011-02-22 ENCOUNTER — Other Ambulatory Visit: Payer: Self-pay | Admitting: *Deleted

## 2011-05-03 ENCOUNTER — Encounter: Payer: Self-pay | Admitting: Internal Medicine

## 2011-05-03 ENCOUNTER — Ambulatory Visit (INDEPENDENT_AMBULATORY_CARE_PROVIDER_SITE_OTHER): Payer: Medicare Other | Admitting: Internal Medicine

## 2011-05-03 VITALS — BP 120/70 | HR 60 | Temp 98.0°F | Resp 16 | Ht 73.0 in | Wt 202.0 lb

## 2011-05-03 DIAGNOSIS — D649 Anemia, unspecified: Secondary | ICD-10-CM | POA: Diagnosis not present

## 2011-05-03 DIAGNOSIS — K5900 Constipation, unspecified: Secondary | ICD-10-CM | POA: Diagnosis not present

## 2011-05-03 DIAGNOSIS — K4090 Unilateral inguinal hernia, without obstruction or gangrene, not specified as recurrent: Secondary | ICD-10-CM | POA: Diagnosis not present

## 2011-05-03 DIAGNOSIS — M25569 Pain in unspecified knee: Secondary | ICD-10-CM | POA: Diagnosis not present

## 2011-05-03 LAB — CBC WITH DIFFERENTIAL/PLATELET
Basophils Absolute: 0 10*3/uL (ref 0.0–0.1)
Basophils Relative: 0.8 % (ref 0.0–3.0)
Eosinophils Absolute: 0.1 10*3/uL (ref 0.0–0.7)
HCT: 41.2 % (ref 39.0–52.0)
Hemoglobin: 14.1 g/dL (ref 13.0–17.0)
Lymphocytes Relative: 23.9 % (ref 12.0–46.0)
Lymphs Abs: 1.4 10*3/uL (ref 0.7–4.0)
MCHC: 34.1 g/dL (ref 30.0–36.0)
MCV: 96.1 fl (ref 78.0–100.0)
Neutro Abs: 3.5 10*3/uL (ref 1.4–7.7)
RBC: 4.29 Mil/uL (ref 4.22–5.81)
RDW: 13.4 % (ref 11.5–14.6)

## 2011-05-03 MED ORDER — MELOXICAM 7.5 MG PO TABS: 7.5000 mg | ORAL_TABLET | Freq: Every day | ORAL | Status: DC

## 2011-05-03 MED ORDER — FOLIC ACID-VIT B6-VIT B12 2.5-25-1 MG PO TABS: 1.0000 | ORAL_TABLET | Freq: Every day | ORAL | Status: DC

## 2011-05-03 NOTE — Patient Instructions (Signed)
The patient is instructed to continue all medications as prescribed. Schedule followup with check out clerk upon leaving the clinic  

## 2011-05-03 NOTE — Progress Notes (Signed)
Subjective:    Patient ID: Darin [name], male    DOB: 1931-07-29, 78 y.o.   MRN: [id number]  HPI Follow up for neuropathic pain and right knee pain. Patient did not take the Neurontin and his insurance with an attempt at trying to lower cough.  He was on meloxicam for chronic inflammation but stopped that as well to see if that wasn't effective drug and developed increased pain in his right knee. He resumed a meloxicam and that seemed to ameliorate the knee pain but not completely relieved it.  hx of chronic constipation on miralax Hx of diverticulitis  Hearing loss with hx of wax impaction  Hernia pain increased.      Review of Systems  Constitutional: Negative for fever and fatigue.  HENT: Negative for hearing loss, congestion, neck pain and postnasal drip.   Eyes: Negative for discharge, redness and visual disturbance.  Respiratory: Negative for cough, shortness of breath and wheezing.   Cardiovascular: Negative for leg swelling.  Gastrointestinal: Positive for abdominal pain. Negative for constipation and abdominal distention.  Genitourinary: Negative for urgency and frequency.  Musculoskeletal: Positive for myalgias and back pain. Negative for joint swelling and arthralgias.  Skin: Negative for color change and rash.  Neurological: Positive for tremors, weakness and numbness. Negative for light-headedness.  Hematological: Negative for adenopathy.  Psychiatric/Behavioral: Negative for behavioral problems.   Past Medical History  Diagnosis Date  . GERD (gastroesophageal reflux disease)   . Hyperlipidemia   . Hypertension   . Arthritis   . PMR (polymyalgia rheumatica)     History   Social History  . Marital Status: Widowed    Spouse Name: N/A    Number of Children: N/A  . Years of Education: N/A   Occupational History  . retired    Social History Main Topics  . Smoking status: Never Smoker   . Smokeless tobacco: Not on file  . Alcohol Use: Yes  . Drug Use:  No  . Sexually Active: Yes   Other Topics Concern  . Not on file   Social History Narrative  . No narrative on file    Past Surgical History  Procedure Date  . Tonsillectomy     Family History  Problem Relation Age of Onset  . Coronary artery disease      Allergies  Allergen Reactions  . Statins     Current Outpatient Prescriptions on File Prior to Visit  Medication Sig Dispense Refill  . aspirin 81 MG tablet Take 81 mg by mouth daily.        Marland Kitchen atenolol (TENORMIN) 50 MG tablet Take 25 mg by mouth daily.        . benazepril-hydrochlorthiazide (LOTENSIN HCT) 10-12.5 MG per tablet Take 1 tablet by mouth daily.  90 tablet  3  . Calcium Carb-Cholecalciferol (CALCIUM 500 +D) 500-400 MG-UNIT TABS Take by mouth 2 (two) times daily.        . cyanocobalamin ([facility] VITAMIN B12) 1000 MCG tablet Take 1 tablet (1,000 mcg total) by mouth daily.  100 tablet  2  . famotidine (PEPCID) 20 MG tablet Take 1 tablet (20 mg total) by mouth 2 (two) times daily.  180 tablet  3  . fish oil-omega-3 fatty acids 1000 MG capsule Take 2 g by mouth 2 (two) times daily.        . niacin 500 MG tablet Take 500 mg by mouth 2 (two) times daily as needed.        . polyethylene glycol (MIRALAX /  GLYCOLAX) packet Take 17 g by mouth 2 (two) times daily.        . saw palmetto 80 MG capsule Take 80 mg by mouth daily.        . vardenafil (LEVITRA) 20 MG tablet Take 1 tablet (20 mg total) by mouth daily as needed.  10 tablet  6   Current Facility-Administered Medications on File Prior to Visit  Medication Dose Route Frequency Provider Last Rate Last Dose  . DISCONTD: cyanocobalamin ((VITAMIN B-12)) injection 1,000 mcg  1,000 mcg Intramuscular Q30 days Carrie Mew, MD   1,000 mcg at 05/24/10 1250  . DISCONTD: cyanocobalamin ((VITAMIN B-12)) injection 1,000 mcg  1,000 mcg Intramuscular Q30 days Carrie Mew, MD   1,000 mcg at 06/20/10 1113  . DISCONTD: cyanocobalamin ((VITAMIN B-12)) injection 1,000 mcg   1,000 mcg Intramuscular Q30 days Carrie Mew, MD   1,000 mcg at 01/29/11 1316    BP 120/70  Pulse 60  Temp 98 F (36.7 C)  Resp 16  Ht 6\' 1"  (1.854 m)  Wt 202 lb (91.627 kg)  BMI 26.65 kg/m2       Objective:   Physical Exam  Nursing note and vitals reviewed. Constitutional: He is oriented to person, place, and time. He appears well-developed and well-nourished.       thin  HENT:  Head: Normocephalic and atraumatic.  Eyes: Conjunctivae are normal. Pupils are equal, round, and reactive to light.  Neck: Normal range of motion. Neck supple.  Cardiovascular: Normal rate and regular rhythm.   Pulmonary/Chest: Effort normal and breath sounds normal.  Abdominal: Soft. Bowel sounds are normal.  Musculoskeletal: He exhibits edema and tenderness.  Neurological: He is alert and oriented to person, place, and time.  Skin: Skin is warm and dry.  Psychiatric: He has a normal mood and affect. His behavior is normal.          Assessment & Plan:  Right knee pain.... Probable arthritic pain 3/10 pain continue mobic HTN stable monitor anemia. monitoring of iron and cbc  Refer for hernia repair discussion

## 2011-05-06 ENCOUNTER — Encounter (INDEPENDENT_AMBULATORY_CARE_PROVIDER_SITE_OTHER): Payer: Self-pay | Admitting: Surgery

## 2011-05-20 ENCOUNTER — Ambulatory Visit (INDEPENDENT_AMBULATORY_CARE_PROVIDER_SITE_OTHER): Payer: Medicare Other | Admitting: Surgery

## 2011-05-20 ENCOUNTER — Encounter (INDEPENDENT_AMBULATORY_CARE_PROVIDER_SITE_OTHER): Payer: Self-pay | Admitting: Surgery

## 2011-05-20 VITALS — BP 138/66 | HR 64 | Temp 97.7°F | Resp 16 | Ht 73.0 in | Wt 207.8 lb

## 2011-05-20 DIAGNOSIS — K4090 Unilateral inguinal hernia, without obstruction or gangrene, not specified as recurrent: Secondary | ICD-10-CM | POA: Diagnosis not present

## 2011-05-20 NOTE — Progress Notes (Signed)
Patient ID: Darin [name], male   DOB: 03-14-1932, 78 y.o.   MRN: [id number]  Chief Complaint  Patient presents with  . Inguinal Hernia    new pt- eval RIH    HPI Darin [name] is a 78 y.o. male.   HPIThis is a very pleasant gentleman referred by Dr. Lovell Sheehan for evaluation of a right inguinal hernia. The patient has had a hernia in his right groin for several years which has been asymptomatic until recently. He is now noticing that the bulge is slightly more pronounced. It always easily reduces. He is now having some mild discomfort. He denies any obstructive symptoms.  Past Medical History  Diagnosis Date  . GERD (gastroesophageal reflux disease)   . Hyperlipidemia   . Hypertension   . Arthritis   . PMR (polymyalgia rheumatica)   . Knee pain   . Osteoporosis     Past Surgical History  Procedure Date  . Tonsillectomy   . Carpal tunnel release   . Varicose vein surgery   . Cataract extraction     right    Family History  Problem Relation Age of Onset  . Coronary artery disease    . Heart disease Father     Social History History  Substance Use Topics  . Smoking status: Never Smoker   . Smokeless tobacco: Not on file  . Alcohol Use: Yes    Allergies  Allergen Reactions  . Statins     Current Outpatient Prescriptions  Medication Sig Dispense Refill  . aspirin 81 MG tablet Take 81 mg by mouth daily.        Marland Kitchen atenolol (TENORMIN) 50 MG tablet Take 25 mg by mouth daily.        . benazepril-hydrochlorthiazide (LOTENSIN HCT) 10-12.5 MG per tablet Take 1 tablet by mouth daily.  90 tablet  3  . Calcium Carb-Cholecalciferol (CALCIUM 500 +D) 500-400 MG-UNIT TABS Take by mouth 2 (two) times daily.        . cyanocobalamin ([facility]S VITAMIN B12) 1000 MCG tablet Take 1 tablet (1,000 mcg total) by mouth daily.  100 tablet  2  . famotidine (PEPCID) 20 MG tablet Take 1 tablet (20 mg total) by mouth 2 (two) times daily.  180 tablet  3  . fish oil-omega-3 fatty acids 1000 MG capsule  Take 2 g by mouth 2 (two) times daily.        . Folic Acid-Vit B6-Vit B12 (FOLBEE) 2.5-25-1 MG TABS Take 1 tablet by mouth daily.  90 tablet  3  . meloxicam (MOBIC) 7.5 MG tablet Take 1 tablet (7.5 mg total) by mouth daily.  90 tablet  3  . niacin 500 MG tablet Take 500 mg by mouth 2 (two) times daily as needed.        . polyethylene glycol (MIRALAX / GLYCOLAX) packet Take 17 g by mouth 2 (two) times daily.        . saw palmetto 80 MG capsule Take 80 mg by mouth daily.        . vardenafil (LEVITRA) 20 MG tablet Take 1 tablet (20 mg total) by mouth daily as needed.  10 tablet  6  . vitamin C (ASCORBIC ACID) 500 MG tablet Take 500 mg by mouth daily.        Review of Systems Review of Systems  Constitutional: Negative for fever, chills and unexpected weight change.  HENT: Negative for hearing loss, congestion, sore throat, trouble swallowing and voice change.   Eyes: Negative for visual disturbance.  Respiratory: Negative for cough and wheezing.   Cardiovascular: Negative for chest pain, palpitations and leg swelling.  Gastrointestinal: Negative for nausea, vomiting, abdominal pain, diarrhea, constipation, blood in stool, abdominal distention, anal bleeding and rectal pain.  Genitourinary: Negative for hematuria and difficulty urinating.  Musculoskeletal: Positive for joint swelling and arthralgias.  Skin: Negative for rash and wound.  Neurological: Negative for seizures, syncope, weakness and headaches.  Hematological: Negative for adenopathy. Does not bruise/bleed easily.  Psychiatric/Behavioral: Negative for confusion.    Blood pressure 138/66, pulse 64, temperature 97.7 F (36.5 C), temperature source Temporal, resp. rate 16, height 6\' 1"  (1.854 m), weight 207 lb 12.8 oz (94.257 kg).  Physical Exam Physical Exam  Constitutional: He is oriented to person, place, and time. He appears well-developed and well-nourished. No distress.  HENT:  Head: Normocephalic and atraumatic.  Right  Ear: External ear normal.  Left Ear: External ear normal.  Nose: Nose normal.  Mouth/Throat: Oropharynx is clear and moist. No oropharyngeal exudate.  Eyes: Conjunctivae are normal. Pupils are equal, round, and reactive to light. Right eye exhibits no discharge. Left eye exhibits no discharge. No scleral icterus.  Neck: Normal range of motion. Neck supple. No tracheal deviation present. No thyromegaly present.  Cardiovascular: Normal rate, normal heart sounds and intact distal pulses.   No murmur heard. Pulmonary/Chest: Effort normal and breath sounds normal. No respiratory distress. He has no wheezes. He has no rales.  Abdominal: Soft. Bowel sounds are normal. He exhibits no distension. There is no tenderness. There is no rebound.       There is a small, easily reducible right inguinal hernia. There is no evidence of left inguinal hernia or umbilical hernia  Musculoskeletal: Normal range of motion. He exhibits no edema and no tenderness.  Lymphadenopathy:    He has no cervical adenopathy.  Neurological: He is alert and oriented to person, place, and time.  Skin: Skin is warm and dry. No rash noted. He is not diaphoretic. No erythema.  Psychiatric: His behavior is normal. Judgment normal.    Data Reviewed I have the notes from Dr. Lovell Sheehan office which I have reviewed  Assessment    Easily reducible, slightly symptomatic right inguinal hernia    Plan    I have recommended we repair this with mesh as it is symptomatic now. He is getting ready to go on to significant medications [place]. I believe his risks of incarceration it is quite small. We discussed this in detail. He is going to go ahead and take the strips and then schedule his hernia repair following that.  I explained the signs of incarceration with him. He will call me back at the end of the week should he change his mind and wanted surgery before his vacation. If not, he will come back and see me when he returns to  [place].       Darin [name] A 05/20/2011, 12:11 PM

## 2011-06-27 ENCOUNTER — Other Ambulatory Visit: Payer: Self-pay | Admitting: *Deleted

## 2011-06-27 MED ORDER — ATENOLOL 50 MG PO TABS: 25.0000 mg | ORAL_TABLET | Freq: Every day | ORAL | Status: DC

## 2011-07-05 ENCOUNTER — Encounter: Payer: Self-pay | Admitting: Internal Medicine

## 2011-07-05 ENCOUNTER — Ambulatory Visit (INDEPENDENT_AMBULATORY_CARE_PROVIDER_SITE_OTHER): Payer: PRIVATE HEALTH INSURANCE | Admitting: Internal Medicine

## 2011-07-05 VITALS — BP 124/70 | HR 72 | Temp 98.3°F | Resp 16 | Ht 73.0 in | Wt 202.0 lb

## 2011-07-05 DIAGNOSIS — M1710 Unilateral primary osteoarthritis, unspecified knee: Secondary | ICD-10-CM | POA: Diagnosis not present

## 2011-07-05 DIAGNOSIS — E785 Hyperlipidemia, unspecified: Secondary | ICD-10-CM

## 2011-07-05 DIAGNOSIS — G63 Polyneuropathy in diseases classified elsewhere: Secondary | ICD-10-CM

## 2011-07-05 DIAGNOSIS — D518 Other vitamin B12 deficiency anemias: Secondary | ICD-10-CM | POA: Diagnosis not present

## 2011-07-05 DIAGNOSIS — I10 Essential (primary) hypertension: Secondary | ICD-10-CM | POA: Diagnosis not present

## 2011-07-05 DIAGNOSIS — D519 Vitamin B12 deficiency anemia, unspecified: Secondary | ICD-10-CM

## 2011-07-05 DIAGNOSIS — G4762 Sleep related leg cramps: Secondary | ICD-10-CM | POA: Diagnosis not present

## 2011-07-05 LAB — BASIC METABOLIC PANEL
BUN: 20 mg/dL (ref 6–23)
Calcium: 9.7 mg/dL (ref 8.4–10.5)
Chloride: 97 mEq/L (ref 96–112)
Creatinine, Ser: 0.8 mg/dL (ref 0.4–1.5)

## 2011-07-05 LAB — MAGNESIUM: Magnesium: 2.3 mg/dL (ref 1.5–2.5)

## 2011-07-05 MED ORDER — BACLOFEN 10 MG PO TABS: 10.0000 mg | ORAL_TABLET | Freq: Three times a day (TID) | ORAL | Status: AC

## 2011-07-05 MED ORDER — MAGNESIUM OXIDE 400 MG PO TABS: 400.0000 mg | ORAL_TABLET | Freq: Every day | ORAL | Status: DC

## 2011-07-05 MED ORDER — CYANOCOBALAMIN 1000 MCG/ML IJ SOLN
1000.0000 ug | INTRAMUSCULAR | Status: AC
  Administered 2011-07-05: 1000 ug via INTRAMUSCULAR

## 2011-07-05 MED ORDER — MAGNESIUM OXIDE 400 MG PO TABS: 400.0000 mg | ORAL_TABLET | Freq: Every day | ORAL | Status: AC

## 2011-07-05 NOTE — Progress Notes (Signed)
Subjective:    Patient ID: Darin [name], male    DOB: Aug 17, 1931, 78 y.o.   MRN: [id number]  HPI Presents for follow up of HTN, hyperipidemia, OA and has been evaluated for inguinal hernia. He has a trip planned for [place] he has experienced over the last few weeks increasing cramping in the leg right sided.  This does affect ambulation.  His risk factors for medication-induced cramping of the leg are minimal since he is not on a statin drug.  He does have mild to moderate hyperlipidemia but no evidence for peripheral vascular disease in the past he does have a history of polymyalgia rheumatica And he does use fish oil   Review of Systems  Constitutional: Negative for fever and fatigue.  HENT: Negative for hearing loss, congestion, neck pain and postnasal drip.   Eyes: Negative for discharge, redness and visual disturbance.  Respiratory: Negative for cough, shortness of breath and wheezing.   Cardiovascular: Negative for leg swelling.  Gastrointestinal: Negative for abdominal pain, constipation and abdominal distention.  Genitourinary: Negative for urgency and frequency.  Musculoskeletal: Negative for joint swelling and arthralgias.  Skin: Negative for color change and rash.  Neurological: Negative for weakness and light-headedness.  Hematological: Negative for adenopathy.  Psychiatric/Behavioral: Negative for behavioral problems.   Past Medical History  Diagnosis Date  . GERD (gastroesophageal reflux disease)   . Hyperlipidemia   . Hypertension   . Arthritis   . PMR (polymyalgia rheumatica)   . Knee pain   . Osteoporosis     History   Social History  . Marital Status: Widowed    Spouse Name: N/A    Number of Children: N/A  . Years of Education: N/A   Occupational History  . retired    Social History Main Topics  . Smoking status: Never Smoker   . Smokeless tobacco: Not on file  . Alcohol Use: Yes  . Drug Use: No  . Sexually Active: Yes    Other Topics Concern  . Not on file   Social History Narrative  . No narrative on file    Past Surgical History  Procedure Date  . Tonsillectomy   . Carpal tunnel release   . Varicose vein surgery   . Cataract extraction     right    Family History  Problem Relation Age of Onset  . Coronary artery disease    . Heart disease Father     Allergies  Allergen Reactions  . Statins     Current Outpatient Prescriptions on File Prior to Visit  Medication Sig Dispense Refill  . aspirin 81 MG tablet Take 81 mg by mouth daily.        Marland Kitchen atenolol (TENORMIN) 50 MG tablet Take 0.5 tablets (25 mg total) by mouth daily.  90 tablet  3  . benazepril-hydrochlorthiazide (LOTENSIN HCT) 10-12.5 MG per tablet Take 1 tablet by mouth daily.  90 tablet  3  . Calcium Carb-Cholecalciferol (CALCIUM 500 +D) 500-400 MG-UNIT TABS Take by mouth 2 (two) times daily.        . cyanocobalamin ([facility]S VITAMIN B12) 1000 MCG tablet Take 1 tablet (1,000 mcg total) by mouth daily.  100 tablet  2  . famotidine (PEPCID) 20 MG tablet Take 1 tablet (20 mg total) by mouth 2 (two) times daily.  180 tablet  3  . fish oil-omega-3 fatty acids 1000 MG capsule Take 2 g by mouth 2 (two) times daily.        . Folic  Acid-Vit B6-Vit B12 (FOLBEE) 2.5-25-1 MG TABS Take 1 tablet by mouth daily.  90 tablet  3  . meloxicam (MOBIC) 7.5 MG tablet Take 1 tablet (7.5 mg total) by mouth daily.  90 tablet  3  . niacin 500 MG tablet Take 500 mg by mouth 2 (two) times daily as needed.        . polyethylene glycol (MIRALAX / GLYCOLAX) packet Take 17 g by mouth 2 (two) times daily.        . saw palmetto 80 MG capsule Take 80 mg by mouth daily.        . vardenafil (LEVITRA) 20 MG tablet Take 1 tablet (20 mg total) by mouth daily as needed.  10 tablet  6  . vitamin C (ASCORBIC ACID) 500 MG tablet Take 500 mg by mouth daily.       No current facility-administered medications on file prior to visit.    BP 124/70  Pulse 72  Temp 98.3 F (36.8  C)  Resp 16  Ht 6\' 1"  (1.854 m)  Wt 202 lb (91.627 kg)  BMI 26.65 kg/m2        Objective:   Physical Exam  Nursing note and vitals reviewed. Constitutional: He appears well-developed and well-nourished.  HENT:  Head: Normocephalic and atraumatic.  Eyes: Conjunctivae are normal. Pupils are equal, round, and reactive to light.  Neck: Normal range of motion. Neck supple.  Cardiovascular: Normal rate and regular rhythm.   Pulmonary/Chest: Effort normal and breath sounds normal.  Abdominal: Soft. Bowel sounds are normal.          Assessment & Plan:  Increased cramping, Trail of magnesium with a baclofen back up plan monitor potassium and renal function monitor sed rate for hx of PMR  Trial of celebrex Should the Celebrex ineffective: 200 mg twice daily to his long-term pharmacy

## 2011-07-05 NOTE — Patient Instructions (Addendum)
The patient is instructed to continue all medications as prescribed. Schedule followup with check out clerk upon leaving the clinic Trial of Celebrex 1 pill twice daily

## 2011-07-09 ENCOUNTER — Other Ambulatory Visit: Payer: Self-pay | Admitting: *Deleted

## 2011-07-09 MED ORDER — CELECOXIB 200 MG PO CAPS: 200.0000 mg | ORAL_CAPSULE | Freq: Two times a day (BID) | ORAL | Status: DC

## 2011-07-09 MED ORDER — CELECOXIB 200 MG PO CAPS: 200.0000 mg | ORAL_CAPSULE | Freq: Two times a day (BID) | ORAL | Status: AC

## 2011-07-29 ENCOUNTER — Telehealth: Payer: Self-pay | Admitting: *Deleted

## 2011-07-29 ENCOUNTER — Encounter (INDEPENDENT_AMBULATORY_CARE_PROVIDER_SITE_OTHER): Payer: Medicare Other

## 2011-07-29 DIAGNOSIS — M79662 Pain in left lower leg: Secondary | ICD-10-CM

## 2011-07-29 DIAGNOSIS — M79609 Pain in unspecified limb: Secondary | ICD-10-CM | POA: Diagnosis not present

## 2011-07-29 NOTE — Telephone Encounter (Signed)
Pt calls c/o pain in back of left calf and pain behind leg .knee--has just returned from sitting on long trip to [place]. Per dr Lovell Sheehan- send for  Doppler and if ok use ice an d heat alternating

## 2011-07-30 ENCOUNTER — Other Ambulatory Visit: Payer: Self-pay | Admitting: *Deleted

## 2011-07-30 DIAGNOSIS — H43399 Other vitreous opacities, unspecified eye: Secondary | ICD-10-CM | POA: Diagnosis not present

## 2011-07-30 DIAGNOSIS — H2510 Age-related nuclear cataract, unspecified eye: Secondary | ICD-10-CM | POA: Diagnosis not present

## 2011-07-30 DIAGNOSIS — H35379 Puckering of macula, unspecified eye: Secondary | ICD-10-CM | POA: Diagnosis not present

## 2011-07-30 DIAGNOSIS — H43819 Vitreous degeneration, unspecified eye: Secondary | ICD-10-CM | POA: Diagnosis not present

## 2011-08-13 ENCOUNTER — Telehealth: Payer: Self-pay | Admitting: Family Medicine

## 2011-08-13 MED ORDER — BENAZEPRIL-HYDROCHLOROTHIAZIDE 10-12.5 MG PO TABS: 1.0000 | ORAL_TABLET | Freq: Every day | ORAL | Status: DC

## 2011-08-13 MED ORDER — ATENOLOL 50 MG PO TABS: 25.0000 mg | ORAL_TABLET | Freq: Every day | ORAL | Status: DC

## 2011-08-13 NOTE — Telephone Encounter (Signed)
Pt needs refills on: Benazepril HCTZ 10-12.5mg  & the Atenolol 25mg  tabs (50mg  - I think he must take 1/2). Please send these via mail order pharmacy - [facility].

## 2011-08-13 NOTE — Telephone Encounter (Signed)
rx sent in electronically 

## 2011-08-21 ENCOUNTER — Telehealth: Payer: Self-pay | Admitting: Family Medicine

## 2011-08-21 ENCOUNTER — Ambulatory Visit (INDEPENDENT_AMBULATORY_CARE_PROVIDER_SITE_OTHER)
Admission: RE | Admit: 2011-08-21 | Discharge: 2011-08-21 | Disposition: A | Discharge status: Home / Self Care | Payer: Medicare Other | Source: Ambulatory Visit | Attending: Internal Medicine | Admitting: Internal Medicine

## 2011-08-21 DIAGNOSIS — M545 Low back pain, unspecified: Secondary | ICD-10-CM | POA: Diagnosis not present

## 2011-08-21 MED ORDER — PREDNISONE (PAK) 10 MG PO TABS: 10.0000 mg | ORAL_TABLET | Freq: Every day | ORAL | Status: AC

## 2011-08-21 MED ORDER — CYCLOBENZAPRINE HCL 10 MG PO TABS: 10.0000 mg | ORAL_TABLET | Freq: Three times a day (TID) | ORAL | Status: AC

## 2011-08-21 MED ORDER — HYDROCODONE-ACETAMINOPHEN 5-500 MG PO TABS: 1.0000 | ORAL_TABLET | Freq: Three times a day (TID) | ORAL | Status: AC | PRN

## 2011-08-21 NOTE — Telephone Encounter (Signed)
Pt called to check on status of getting a work in ov with Dr Lovell Sheehan. Pt said that his condition is worsening and pt is req call back asap today.

## 2011-08-21 NOTE — Telephone Encounter (Signed)
Pulled from triage vmail - pt called at 9:37am. States he had LL back pain x1-2wks. This morning it has intensified greatly, to the point where pt is in misery and can hardly move. He doesn't know if it is skeletal, muscular, or kidney related. Please call back with advisement on what he should do. Thanks.

## 2011-08-21 NOTE — Telephone Encounter (Signed)
Per dr Liborio Nixon spine xray-vicodan 1 every 4-6 hrs prn pain-flexeril 10 tid for 10 days and prednisone 10 mg 12 days--

## 2011-09-03 ENCOUNTER — Telehealth: Payer: Self-pay | Admitting: Internal Medicine

## 2011-09-03 DIAGNOSIS — M545 Low back pain: Secondary | ICD-10-CM

## 2011-09-03 NOTE — Telephone Encounter (Signed)
Per dr Lovell Sheehan- may have epidural - has had in past at [facility] radiology

## 2011-09-03 NOTE — Telephone Encounter (Signed)
Caller: Darin [name]; PCP: Darryll Capers; CB#: [phone]; ; ; Call regarding Sciatic Pain Down Left Leg;  Pt is calling to say he has sciatic leg pain /left leg since last week which is worsening. Pt had called the office last week and was prescribed  Prednisone/Flexeril and Darovocet which he has taken. Pt has finished this regimen and is still in instense pain. Pt states the pain starts at his lower back and shoots down the left leg to below the knee.  Rn triaged and advised appt. Pt will only see Dr. Timoteo Gaul or Dr. Lovell Sheehan both of whom are fully booked. Pt refused to see another Provider. Pt is asking if he could be scheduled for an epidural which is how he corrected this last time.  OFFICE NOTE SENT WITH PTS REQUEST.

## 2011-09-05 ENCOUNTER — Ambulatory Visit
Admission: RE | Admit: 2011-09-05 | Discharge: 2011-09-05 | Disposition: A | Discharge status: Home / Self Care | Payer: Medicare Other | Source: Ambulatory Visit | Attending: Internal Medicine | Admitting: Internal Medicine

## 2011-09-05 DIAGNOSIS — M545 Low back pain: Secondary | ICD-10-CM

## 2011-09-05 DIAGNOSIS — M5126 Other intervertebral disc displacement, lumbar region: Secondary | ICD-10-CM | POA: Diagnosis not present

## 2011-09-05 MED ORDER — METHYLPREDNISOLONE ACETATE 40 MG/ML INJ SUSP (RADIOLOG
120.0000 mg | Freq: Once | INTRAMUSCULAR | Status: AC
  Administered 2011-09-05: 120 mg via EPIDURAL

## 2011-09-05 MED ORDER — IOHEXOL 180 MG/ML  SOLN
1.0000 mL | Freq: Once | INTRAMUSCULAR | Status: AC | PRN
  Administered 2011-09-05: 1 mL via EPIDURAL

## 2011-09-05 NOTE — Discharge Instructions (Signed)

## 2011-09-09 ENCOUNTER — Ambulatory Visit (INDEPENDENT_AMBULATORY_CARE_PROVIDER_SITE_OTHER): Payer: Medicare Other | Admitting: Surgery

## 2011-09-09 VITALS — BP 130/78 | HR 72 | Temp 96.8°F | Resp 12 | Ht 73.0 in | Wt 198.0 lb

## 2011-09-09 DIAGNOSIS — K4090 Unilateral inguinal hernia, without obstruction or gangrene, not specified as recurrent: Secondary | ICD-10-CM | POA: Diagnosis not present

## 2011-09-09 NOTE — Progress Notes (Signed)
Subjective:     Patient ID: Darin [name], male   DOB: 01-29-1932, 78 y.o.   MRN: [id number]  HPI He has returned from his vacation and has had no issues regarding a right inguinal hernia. He is now interested in scheduling repair  Review of Systems     Objective:   Physical Exam On exam, he has an easily reducible right inguinal hernia. There is no evidence of left inguinal hernia    Assessment:     Right inguinal hernia    Plan:     He will now be scheduled for right inguinal hernia repair with mesh

## 2011-09-16 ENCOUNTER — Encounter: Payer: Self-pay | Admitting: Internal Medicine

## 2011-09-16 ENCOUNTER — Ambulatory Visit (INDEPENDENT_AMBULATORY_CARE_PROVIDER_SITE_OTHER): Payer: Medicare Other | Admitting: Internal Medicine

## 2011-09-16 VITALS — BP 140/70 | HR 72 | Temp 98.6°F | Resp 16 | Ht 73.0 in | Wt 198.0 lb

## 2011-09-16 DIAGNOSIS — I10 Essential (primary) hypertension: Secondary | ICD-10-CM

## 2011-09-16 DIAGNOSIS — E538 Deficiency of other specified B group vitamins: Secondary | ICD-10-CM | POA: Diagnosis not present

## 2011-09-16 DIAGNOSIS — M5430 Sciatica, unspecified side: Secondary | ICD-10-CM

## 2011-09-16 DIAGNOSIS — M5432 Sciatica, left side: Secondary | ICD-10-CM

## 2011-09-16 LAB — BASIC METABOLIC PANEL
CO2: 34 mEq/L — ABNORMAL HIGH (ref 19–32)
Chloride: 98 mEq/L (ref 96–112)
Creatinine, Ser: 0.7 mg/dL (ref 0.4–1.5)
Glucose, Bld: 97 mg/dL (ref 70–99)
Sodium: 137 mEq/L (ref 135–145)

## 2011-09-16 LAB — CBC WITH DIFFERENTIAL/PLATELET
Basophils Absolute: 0 10*3/uL (ref 0.0–0.1)
Eosinophils Relative: 1.3 % (ref 0.0–5.0)
HCT: 40.1 % (ref 39.0–52.0)
Hemoglobin: 13.3 g/dL (ref 13.0–17.0)
Lymphocytes Relative: 16.8 % (ref 12.0–46.0)
Lymphs Abs: 1.2 10*3/uL (ref 0.7–4.0)
Monocytes Relative: 7.9 % (ref 3.0–12.0)
Neutro Abs: 5.4 10*3/uL (ref 1.4–7.7)
RDW: 13.9 % (ref 11.5–14.6)
WBC: 7.4 10*3/uL (ref 4.5–10.5)

## 2011-09-16 MED ORDER — CYANOCOBALAMIN 1000 MCG/ML IJ SOLN
1000.0000 ug | INTRAMUSCULAR | Status: DC
  Administered 2011-09-16: 1000 ug via INTRAMUSCULAR

## 2011-09-16 NOTE — Progress Notes (Signed)
Subjective:    Patient ID: Darin [name], male    DOB: [birth year], 78 y.o.   MRN: [id number]  HPI After the injection his pain dropped from 10 to 1! The interventional radiologist stated that her can have another shot if this one fails too soon.    Review of Systems  Constitutional: Negative for fever and fatigue.  HENT: Negative for hearing loss, congestion, neck pain and postnasal drip.   Eyes: Negative for discharge, redness and visual disturbance.  Respiratory: Negative for cough, shortness of breath and wheezing.   Cardiovascular: Negative for leg swelling.  Gastrointestinal: Negative for abdominal pain, constipation and abdominal distention.  Genitourinary: Negative for urgency and frequency.  Musculoskeletal: Positive for back pain, joint swelling and gait problem. Negative for arthralgias.  Skin: Negative for color change and rash.  Neurological: Negative for weakness and light-headedness.  Hematological: Negative for adenopathy.  Psychiatric/Behavioral: Negative for behavioral problems.   Past Medical History  Diagnosis Date  . GERD (gastroesophageal reflux disease)   . Hyperlipidemia   . Hypertension   . Arthritis   . PMR (polymyalgia rheumatica)   . Knee pain   . Osteoporosis     History   Social History  . Marital Status: Widowed    Spouse Name: N/A    Number of Children: N/A  . Years of Education: N/A   Occupational History  . retired    Social History Main Topics  . Smoking status: Never Smoker   . Smokeless tobacco: Not on file  . Alcohol Use: Yes  . Drug Use: No  . Sexually Active: Yes   Other Topics Concern  . Not on file   Social History Narrative  . No narrative on file    Past Surgical History  Procedure Date  . Tonsillectomy   . Carpal tunnel release   . Varicose vein surgery   . Cataract extraction     right    Family History  Problem Relation Age of Onset  . Coronary artery disease    . Heart disease Father      Allergies  Allergen Reactions  . Statins Other (See Comments)    Muscle pain and upset stomach    Current Outpatient Prescriptions on File Prior to Visit  Medication Sig Dispense Refill  . aspirin 81 MG tablet Take 81 mg by mouth daily.        Marland Kitchen atenolol (TENORMIN) 50 MG tablet Take 0.5 tablets (25 mg total) by mouth daily.  90 tablet  3  . benazepril-hydrochlorthiazide (LOTENSIN HCT) 10-12.5 MG per tablet Take 1 tablet by mouth daily.  90 tablet  3  . Calcium Carb-Cholecalciferol (CALCIUM 500 +D) 500-400 MG-UNIT TABS Take by mouth 2 (two) times daily.        . cyanocobalamin ([facility] VITAMIN B12) 1000 MCG tablet Take 1 tablet (1,000 mcg total) by mouth daily.  100 tablet  2  . famotidine (PEPCID) 20 MG tablet Take 20 mg by mouth 2 (two) times daily.      . fish oil-omega-3 fatty acids 1000 MG capsule Take 2 g by mouth 2 (two) times daily.        . Folic Acid-Vit B6-Vit B12 (FOLBEE) 2.5-25-1 MG TABS Take 1 tablet by mouth daily.  90 tablet  3  . magnesium oxide (MAG-OX 400) 400 MG tablet Take 1 tablet (400 mg total) by mouth daily.  1 tablet  11  . niacin 500 MG tablet Take 500 mg by mouth 2 (two) times daily as  needed.        . polyethylene glycol (MIRALAX / GLYCOLAX) packet Take 17 g by mouth 2 (two) times daily.        . saw palmetto 80 MG capsule Take 80 mg by mouth daily.        . vardenafil (LEVITRA) 20 MG tablet Take 1 tablet (20 mg total) by mouth daily as needed.  10 tablet  6  . vitamin C (ASCORBIC ACID) 500 MG tablet Take 500 mg by mouth daily.      Marland Kitchen DISCONTD: famotidine (PEPCID) 20 MG tablet Take 1 tablet (20 mg total) by mouth 2 (two) times daily.  180 tablet  3   No current facility-administered medications on file prior to visit.    BP 140/70  Pulse 72  Temp 98.6 F (37 C)  Resp 16  Ht 6\' 1"  (1.854 m)  Wt 198 lb (89.812 kg)  BMI 26.12 kg/m2        Objective:   Physical Exam  Nursing note and vitals reviewed. Constitutional: He appears well-developed and  well-nourished.  HENT:  Head: Normocephalic and atraumatic.  Eyes: Conjunctivae are normal. Pupils are equal, round, and reactive to light.  Neck: Normal range of motion. Neck supple.  Cardiovascular: Normal rate and regular rhythm.   Pulmonary/Chest: Effort normal and breath sounds normal.  Abdominal: Soft. Bowel sounds are normal. There is tenderness.  Musculoskeletal: He exhibits edema and tenderness.       sciatic          Assessment & Plan:  Sciatic nerve pain blood pressure stable Hernia surgery planned for next month Dr Amie Critchley on 10/14/2011 GERD stable on pepcid without breakthrough Weight stable

## 2011-09-16 NOTE — Patient Instructions (Signed)
The patient is instructed to continue all medications as prescribed. Schedule followup with check out clerk upon leaving the clinic  

## 2011-10-02 ENCOUNTER — Encounter (HOSPITAL_COMMUNITY): Payer: Self-pay | Admitting: Respiratory Therapy

## 2011-10-07 ENCOUNTER — Encounter (HOSPITAL_COMMUNITY): Payer: Self-pay

## 2011-10-07 ENCOUNTER — Encounter (HOSPITAL_COMMUNITY)
Admission: RE | Admit: 2011-10-07 | Discharge: 2011-10-07 | Disposition: A | Discharge status: Home / Self Care | Payer: Medicare Other | Source: Ambulatory Visit | Attending: Surgery | Admitting: Surgery

## 2011-10-07 DIAGNOSIS — E785 Hyperlipidemia, unspecified: Secondary | ICD-10-CM | POA: Diagnosis not present

## 2011-10-07 DIAGNOSIS — K4090 Unilateral inguinal hernia, without obstruction or gangrene, not specified as recurrent: Secondary | ICD-10-CM | POA: Diagnosis not present

## 2011-10-07 DIAGNOSIS — K219 Gastro-esophageal reflux disease without esophagitis: Secondary | ICD-10-CM | POA: Diagnosis not present

## 2011-10-07 DIAGNOSIS — M353 Polymyalgia rheumatica: Secondary | ICD-10-CM | POA: Diagnosis not present

## 2011-10-07 DIAGNOSIS — M810 Age-related osteoporosis without current pathological fracture: Secondary | ICD-10-CM | POA: Diagnosis not present

## 2011-10-07 DIAGNOSIS — K449 Diaphragmatic hernia without obstruction or gangrene: Secondary | ICD-10-CM | POA: Diagnosis not present

## 2011-10-07 DIAGNOSIS — M129 Arthropathy, unspecified: Secondary | ICD-10-CM | POA: Diagnosis not present

## 2011-10-07 DIAGNOSIS — I10 Essential (primary) hypertension: Secondary | ICD-10-CM | POA: Diagnosis not present

## 2011-10-07 DIAGNOSIS — Z01812 Encounter for preprocedural laboratory examination: Secondary | ICD-10-CM | POA: Diagnosis not present

## 2011-10-07 DIAGNOSIS — Z01810 Encounter for preprocedural cardiovascular examination: Secondary | ICD-10-CM | POA: Diagnosis not present

## 2011-10-07 HISTORY — DX: Personal history of other diseases of the digestive system: Z87.19

## 2011-10-07 HISTORY — DX: Polymyalgia rheumatica: M35.3

## 2011-10-07 HISTORY — DX: Polyneuropathy, unspecified: G62.9

## 2011-10-07 LAB — SURGICAL PCR SCREEN: Staphylococcus aureus: NEGATIVE

## 2011-10-07 LAB — BASIC METABOLIC PANEL
BUN: 16 mg/dL (ref 6–23)
Chloride: 97 mEq/L (ref 96–112)
Creatinine, Ser: 0.8 mg/dL (ref 0.50–1.35)
Glucose, Bld: 101 mg/dL — ABNORMAL HIGH (ref 70–99)
Potassium: 4.2 mEq/L (ref 3.5–5.1)

## 2011-10-07 LAB — CBC
HCT: 39.9 % (ref 39.0–52.0)
Hemoglobin: 13.6 g/dL (ref 13.0–17.0)
MCV: 94.3 fL (ref 78.0–100.0)
WBC: 7.3 10*3/uL (ref 4.0–10.5)

## 2011-10-07 NOTE — Pre-Procedure Instructions (Signed)
20 Darin [name]  10/07/2011   Your procedure is scheduled on:  [place] 10/14/11  Report to [facility] at 0912 AM.  Call this number if you have problems the morning of surgery: [phone]   Remember:   Do not eat food:After Midnight.  May have  liquids:until Midnight .  Clear liquids include soda, tea, black coffee, apple or grape juice, broth.  Take these medicines the morning of surgery with A SIP OF WATER: atenolol  pepcid              STOP ASPIRIN PLAVIX COUMADIN EFFIENT AND HERBAL MEDICINES   Do not wear jewelry, make-up or nail polish.  Do not wear lotions, powders, or perfumes. You may wear deodorant.  Do not shave 48 hours prior to surgery. Men may shave face and neck.  Do not bring valuables to the hospital.  Contacts, dentures or bridgework may not be worn into surgery.  Leave suitcase in the car. After surgery it may be brought to your room.  For patients admitted to the hospital, checkout time is 11:00 AM the day of discharge.   Patients discharged the day of surgery will not be allowed to drive home.  Name and phone number of your driver:   Special Instructions: CHG Shower Use Special Wash: 1/2 bottle night before surgery and 1/2 bottle morning of surgery.   Please read over the following fact sheets that you were given: Pain Booklet, Coughing and Deep Breathing, Lab Information, MRSA Information and Surgical Site Infection Prevention

## 2011-10-13 MED ORDER — CEFAZOLIN SODIUM-DEXTROSE 2-3 GM-% IV SOLR
2.0000 g | INTRAVENOUS | Status: AC
  Administered 2011-10-14: 2 g via INTRAVENOUS
  Filled 2011-10-13: qty 50

## 2011-10-14 ENCOUNTER — Encounter (HOSPITAL_COMMUNITY): Payer: Self-pay | Admitting: Anesthesiology

## 2011-10-14 ENCOUNTER — Encounter (HOSPITAL_COMMUNITY)
Admission: RE | Disposition: A | Discharge status: Home / Self Care | Payer: Self-pay | Source: Ambulatory Visit | Attending: Surgery

## 2011-10-14 ENCOUNTER — Ambulatory Visit (HOSPITAL_COMMUNITY): Payer: Medicare Other

## 2011-10-14 ENCOUNTER — Ambulatory Visit (HOSPITAL_COMMUNITY)
Admission: RE | Admit: 2011-10-14 | Discharge: 2011-10-14 | Disposition: A | Discharge status: Home / Self Care | Payer: Medicare Other | Source: Ambulatory Visit | Attending: Surgery | Admitting: Surgery

## 2011-10-14 ENCOUNTER — Encounter (HOSPITAL_COMMUNITY): Payer: Self-pay | Admitting: *Deleted

## 2011-10-14 ENCOUNTER — Ambulatory Visit (HOSPITAL_COMMUNITY): Payer: Medicare Other | Admitting: Anesthesiology

## 2011-10-14 DIAGNOSIS — M810 Age-related osteoporosis without current pathological fracture: Secondary | ICD-10-CM | POA: Insufficient documentation

## 2011-10-14 DIAGNOSIS — K4090 Unilateral inguinal hernia, without obstruction or gangrene, not specified as recurrent: Secondary | ICD-10-CM

## 2011-10-14 DIAGNOSIS — Z01810 Encounter for preprocedural cardiovascular examination: Secondary | ICD-10-CM | POA: Insufficient documentation

## 2011-10-14 DIAGNOSIS — E785 Hyperlipidemia, unspecified: Secondary | ICD-10-CM | POA: Insufficient documentation

## 2011-10-14 DIAGNOSIS — K449 Diaphragmatic hernia without obstruction or gangrene: Secondary | ICD-10-CM | POA: Diagnosis not present

## 2011-10-14 DIAGNOSIS — M353 Polymyalgia rheumatica: Secondary | ICD-10-CM | POA: Insufficient documentation

## 2011-10-14 DIAGNOSIS — I10 Essential (primary) hypertension: Secondary | ICD-10-CM | POA: Diagnosis not present

## 2011-10-14 DIAGNOSIS — K219 Gastro-esophageal reflux disease without esophagitis: Secondary | ICD-10-CM | POA: Diagnosis not present

## 2011-10-14 DIAGNOSIS — Z01812 Encounter for preprocedural laboratory examination: Secondary | ICD-10-CM | POA: Diagnosis not present

## 2011-10-14 DIAGNOSIS — M129 Arthropathy, unspecified: Secondary | ICD-10-CM | POA: Insufficient documentation

## 2011-10-14 HISTORY — PX: INGUINAL HERNIA REPAIR: SHX194

## 2011-10-14 SURGERY — REPAIR, HERNIA, INGUINAL, ADULT
Anesthesia: General | Site: Groin | Laterality: Right | Wound class: Clean

## 2011-10-14 MED ORDER — PROMETHAZINE HCL 25 MG/ML IJ SOLN: 6.2500 mg | INTRAMUSCULAR | Status: DC | PRN

## 2011-10-14 MED ORDER — OXYCODONE HCL 5 MG PO TABS
5.0000 mg | ORAL_TABLET | ORAL | Status: DC | PRN
  Administered 2011-10-14: 5 mg via ORAL

## 2011-10-14 MED ORDER — FENTANYL CITRATE 0.05 MG/ML IJ SOLN
INTRAMUSCULAR | Status: DC | PRN
  Administered 2011-10-14: 100 ug via INTRAVENOUS

## 2011-10-14 MED ORDER — LACTATED RINGERS IV SOLN: INTRAVENOUS | Status: DC

## 2011-10-14 MED ORDER — MEPERIDINE HCL 25 MG/ML IJ SOLN: 6.2500 mg | INTRAMUSCULAR | Status: DC | PRN

## 2011-10-14 MED ORDER — SODIUM CHLORIDE 0.9 % IR SOLN
Status: DC | PRN
  Administered 2011-10-14: 1

## 2011-10-14 MED ORDER — LIDOCAINE HCL (CARDIAC) 20 MG/ML IV SOLN
INTRAVENOUS | Status: DC | PRN
  Administered 2011-10-14: 100 mg via INTRAVENOUS

## 2011-10-14 MED ORDER — HYDROCODONE-ACETAMINOPHEN 5-325 MG PO TABS: 1.0000 | ORAL_TABLET | Freq: Four times a day (QID) | ORAL | Status: AC | PRN

## 2011-10-14 MED ORDER — OXYCODONE HCL 5 MG PO TABS
ORAL_TABLET | ORAL | Status: AC
  Filled 2011-10-14: qty 1

## 2011-10-14 MED ORDER — BUPIVACAINE-EPINEPHRINE 0.5% -1:200000 IJ SOLN
INTRAMUSCULAR | Status: DC | PRN
  Administered 2011-10-14: 20 mL

## 2011-10-14 MED ORDER — DEXTROSE 5 % IV SOLN
INTRAVENOUS | Status: DC | PRN
  Administered 2011-10-14: 12:00:00 via INTRAVENOUS

## 2011-10-14 MED ORDER — LACTATED RINGERS IV SOLN
INTRAVENOUS | Status: DC
  Administered 2011-10-14: 11:00:00 via INTRAVENOUS

## 2011-10-14 MED ORDER — MIDAZOLAM HCL 5 MG/5ML IJ SOLN
INTRAMUSCULAR | Status: DC | PRN
  Administered 2011-10-14: 2 mg via INTRAVENOUS

## 2011-10-14 MED ORDER — FENTANYL CITRATE 0.05 MG/ML IJ SOLN
INTRAMUSCULAR | Status: AC
  Filled 2011-10-14: qty 2

## 2011-10-14 MED ORDER — PROPOFOL 10 MG/ML IV EMUL
INTRAVENOUS | Status: DC | PRN
  Administered 2011-10-14: 180 mg via INTRAVENOUS

## 2011-10-14 MED ORDER — BUPIVACAINE-EPINEPHRINE (PF) 0.5% -1:200000 IJ SOLN
INTRAMUSCULAR | Status: AC
  Filled 2011-10-14: qty 10

## 2011-10-14 MED ORDER — LACTATED RINGERS IV SOLN
INTRAVENOUS | Status: DC | PRN
  Administered 2011-10-14: 12:00:00 via INTRAVENOUS

## 2011-10-14 MED ORDER — ONDANSETRON HCL 4 MG/2ML IJ SOLN
INTRAMUSCULAR | Status: DC | PRN
  Administered 2011-10-14: 4 mg via INTRAVENOUS

## 2011-10-14 MED ORDER — FENTANYL CITRATE 0.05 MG/ML IJ SOLN
25.0000 ug | INTRAMUSCULAR | Status: DC | PRN
  Administered 2011-10-14: 25 ug via INTRAVENOUS

## 2011-10-14 SURGICAL SUPPLY — 42 items
APL SKNCLS STERI-STRIP NONHPOA (GAUZE/BANDAGES/DRESSINGS) ×1
BENZOIN TINCTURE PRP APPL 2/3 (GAUZE/BANDAGES/DRESSINGS) ×2 IMPLANT
BLADE SURG 10 STRL SS (BLADE) ×2 IMPLANT
BLADE SURG 15 STRL LF DISP TIS (BLADE) ×1 IMPLANT
BLADE SURG 15 STRL SS (BLADE) ×2
BLADE SURG ROTATE 9660 (MISCELLANEOUS) IMPLANT
CHLORAPREP W/TINT 26ML (MISCELLANEOUS) ×2 IMPLANT
CLOTH BEACON ORANGE TIMEOUT ST (SAFETY) ×2 IMPLANT
COVER SURGICAL LIGHT HANDLE (MISCELLANEOUS) ×2 IMPLANT
DRAIN PENROSE 1/2X12 LTX STRL (WOUND CARE) ×1 IMPLANT
DRAPE LAPAROTOMY TRNSV 102X78 (DRAPE) ×2 IMPLANT
DRESSING TELFA 8X3 (GAUZE/BANDAGES/DRESSINGS) ×1 IMPLANT
DRSG TEGADERM 4X4.75 (GAUZE/BANDAGES/DRESSINGS) ×1 IMPLANT
ELECT CAUTERY BLADE 6.4 (BLADE) ×2 IMPLANT
ELECT REM PT RETURN 9FT ADLT (ELECTROSURGICAL) ×2
ELECTRODE REM PT RTRN 9FT ADLT (ELECTROSURGICAL) ×1 IMPLANT
GLOVE SURG SIGNA 7.5 PF LTX (GLOVE) ×2 IMPLANT
GOWN PREVENTION PLUS XLARGE (GOWN DISPOSABLE) ×2 IMPLANT
GOWN STRL NON-REIN LRG LVL3 (GOWN DISPOSABLE) ×3 IMPLANT
KIT BASIN OR (CUSTOM PROCEDURE TRAY) ×2 IMPLANT
KIT ROOM TURNOVER OR (KITS) ×2 IMPLANT
MESH PARIETEX PROGRIP RIGHT (Mesh General) ×1 IMPLANT
NDL HYPO 25GX1X1/2 BEV (NEEDLE) ×1 IMPLANT
NEEDLE HYPO 25GX1X1/2 BEV (NEEDLE) ×2 IMPLANT
NS IRRIG 1000ML POUR BTL (IV SOLUTION) ×2 IMPLANT
PACK SURGICAL SETUP 50X90 (CUSTOM PROCEDURE TRAY) ×2 IMPLANT
PAD ARMBOARD 7.5X6 YLW CONV (MISCELLANEOUS) ×4 IMPLANT
PENCIL BUTTON HOLSTER BLD 10FT (ELECTRODE) ×2 IMPLANT
SPECIMEN JAR SMALL (MISCELLANEOUS) IMPLANT
SPONGE GAUZE 4X4 12PLY (GAUZE/BANDAGES/DRESSINGS) ×1 IMPLANT
SPONGE LAP 18X18 X RAY DECT (DISPOSABLE) ×2 IMPLANT
STRIP CLOSURE SKIN 1/2X4 (GAUZE/BANDAGES/DRESSINGS) ×2 IMPLANT
SUT MON AB 4-0 PC3 18 (SUTURE) ×2 IMPLANT
SUT SILK 2 0 SH (SUTURE) IMPLANT
SUT VIC AB 2-0 CT1 27 (SUTURE) ×2
SUT VIC AB 2-0 CT1 TAPERPNT 27 (SUTURE) ×2 IMPLANT
SUT VIC AB 3-0 CT1 27 (SUTURE) ×2
SUT VIC AB 3-0 CT1 TAPERPNT 27 (SUTURE) ×1 IMPLANT
SYR CONTROL 10ML LL (SYRINGE) ×2 IMPLANT
TOWEL OR 17X24 6PK STRL BLUE (TOWEL DISPOSABLE) ×2 IMPLANT
TOWEL OR 17X26 10 PK STRL BLUE (TOWEL DISPOSABLE) ×2 IMPLANT
WATER STERILE IRR 1000ML POUR (IV SOLUTION) IMPLANT

## 2011-10-14 NOTE — H&P (Signed)
Patient ID: Darin [name], male DOB: Jun 30, 1931, 78 y.o. MRN: [id number]  Chief Complaint   Patient presents with   .  Inguinal Hernia     new pt- eval [facility]    HPI  Darin [name] is a 78 y.o. male.  HPIThis is a very pleasant gentleman referred by Dr. Lovell Sheehan for evaluation of a right inguinal hernia. The patient has had a hernia in his right groin for several years which has been asymptomatic until recently. He is now noticing that the bulge is slightly more pronounced. It always easily reduces. He is now having some mild discomfort. He denies any obstructive symptoms.  Past Medical History   Diagnosis  Date   .  GERD (gastroesophageal reflux disease)    .  Hyperlipidemia    .  Hypertension    .  Arthritis    .  PMR (polymyalgia rheumatica)    .  Knee pain    .  Osteoporosis     Past Surgical History   Procedure  Date   .  Tonsillectomy    .  Carpal tunnel release    .  Varicose vein surgery    .  Cataract extraction      right    Family History   Problem  Relation  Age of Onset   .  Coronary artery disease     .  Heart disease  Father     Social History  History   Substance Use Topics   .  Smoking status:  Never Smoker   .  Smokeless tobacco:  Not on file   .  Alcohol Use:  Yes    Allergies   Allergen  Reactions   .  Statins     Current Outpatient Prescriptions   Medication  Sig  Dispense  Refill   .  aspirin 81 MG tablet  Take 81 mg by mouth daily.     Marland Kitchen  atenolol (TENORMIN) 50 MG tablet  Take 25 mg by mouth daily.     .  benazepril-hydrochlorthiazide (LOTENSIN HCT) 10-12.5 MG per tablet  Take 1 tablet by mouth daily.  90 tablet  3   .  Calcium Carb-Cholecalciferol (CALCIUM 500 +D) 500-400 MG-UNIT TABS  Take by mouth 2 (two) times daily.     .  cyanocobalamin ([employer] VITAMIN B12) 1000 MCG tablet  Take 1 tablet (1,000 mcg total) by mouth daily.  100 tablet  2   .  famotidine (PEPCID) 20 MG tablet  Take 1 tablet (20 mg total) by mouth 2 (two) times daily.  180 tablet   3   .  fish oil-omega-3 fatty acids 1000 MG capsule  Take 2 g by mouth 2 (two) times daily.     .  Folic Acid-Vit B6-Vit B12 (FOLBEE) 2.5-25-1 MG TABS  Take 1 tablet by mouth daily.  90 tablet  3   .  meloxicam (MOBIC) 7.5 MG tablet  Take 1 tablet (7.5 mg total) by mouth daily.  90 tablet  3   .  niacin 500 MG tablet  Take 500 mg by mouth 2 (two) times daily as needed.     .  polyethylene glycol (MIRALAX / GLYCOLAX) packet  Take 17 g by mouth 2 (two) times daily.     .  saw palmetto 80 MG capsule  Take 80 mg by mouth daily.     .  vardenafil (LEVITRA) 20 MG tablet  Take 1 tablet (20 mg total) by mouth daily as  needed.  10 tablet  6   .  vitamin C (ASCORBIC ACID) 500 MG tablet  Take 500 mg by mouth daily.      Review of Systems  Review of Systems  Constitutional: Negative for fever, chills and unexpected weight change.  HENT: Negative for hearing loss, congestion, sore throat, trouble swallowing and voice change.  Eyes: Negative for visual disturbance.  Respiratory: Negative for cough and wheezing.  Cardiovascular: Negative for chest pain, palpitations and leg swelling.  Gastrointestinal: Negative for nausea, vomiting, abdominal pain, diarrhea, constipation, blood in stool, abdominal distention, anal bleeding and rectal pain.  Genitourinary: Negative for hematuria and difficulty urinating.  Musculoskeletal: Positive for joint swelling and arthralgias.  Skin: Negative for rash and wound.  Neurological: Negative for seizures, syncope, weakness and headaches.  Hematological: Negative for adenopathy. Does not bruise/bleed easily.  Psychiatric/Behavioral: Negative for confusion.   Blood pressure 138/66, pulse 64, temperature 97.7 F (36.5 C), temperature source Temporal, resp. rate 16, height 6\' 1"  (1.854 m), weight 207 lb 12.8 oz (94.257 kg).  Physical Exam  Physical Exam  Constitutional: He is oriented to person, place, and time. He appears well-developed and well-nourished. No distress.    HENT:  Head: Normocephalic and atraumatic.  Right Ear: External ear normal.  Left Ear: External ear normal.  Nose: Nose normal.  Mouth/Throat: Oropharynx is clear and moist. No oropharyngeal exudate.  Eyes: Conjunctivae are normal. Pupils are equal, round, and reactive to light. Right eye exhibits no discharge. Left eye exhibits no discharge. No scleral icterus.  Neck: Normal range of motion. Neck supple. No tracheal deviation present. No thyromegaly present.  Cardiovascular: Normal rate, normal heart sounds and intact distal pulses.  No murmur heard.  Pulmonary/Chest: Effort normal and breath sounds normal. No respiratory distress. He has no wheezes. He has no rales.  Abdominal: Soft. Bowel sounds are normal. He exhibits no distension. There is no tenderness. There is no rebound.  There is a small, easily reducible right inguinal hernia. There is no evidence of left inguinal hernia or umbilical hernia  Musculoskeletal: Normal range of motion. He exhibits no edema and no tenderness.  Lymphadenopathy:  He has no cervical adenopathy.  Neurological: He is alert and oriented to person, place, and time.  Skin: Skin is warm and dry. No rash noted. He is not diaphoretic. No erythema.  Psychiatric: His behavior is normal. Judgment normal.   Data Reviewed  I have the notes from Dr. Lovell Sheehan office which I have reviewed  Assessment   Easily reducible, slightly symptomatic right inguinal hernia   Plan   Open right inguinal hernia repair with mesh was recommended. I discussed this with the patient in detail. I have discussed the risk of surgery which includes but is not limited to bleeding, infection, nerve entrapment, chronic pain, recurrence, use of mesh, et Karie Soda. I also discussed postoperative recovery. Surgery will be scheduled. Likelihood of success is good.  Tattianna Schnarr A

## 2011-10-14 NOTE — Anesthesia Postprocedure Evaluation (Signed)
  Anesthesia Post-op Note  Patient: Darin [name]  Procedure(s) Performed: Procedure(s) (LRB): HERNIA REPAIR INGUINAL ADULT (Right) INSERTION OF MESH (Right)  Patient Location: PACU  Anesthesia Type: General  Level of Consciousness: awake and alert   Airway and Oxygen Therapy: Patient Spontanous Breathing  Post-op Pain: mild  Post-op Assessment: Post-op Vital signs reviewed, Patient's Cardiovascular Status Stable, Respiratory Function Stable, Patent Airway and No signs of Nausea or vomiting  Post-op Vital Signs: stable  Complications: No apparent anesthesia complications

## 2011-10-14 NOTE — Anesthesia Preprocedure Evaluation (Addendum)
Anesthesia Evaluation  Patient identified by MRN, date of birth, ID band Patient awake    Reviewed: Allergy & Precautions, H&P , NPO status , Patient's Chart, lab work & pertinent test results  Airway Mallampati: II TM Distance: >3 FB Neck ROM: Full    Dental No notable dental hx. (+) Dental Advisory Given and Teeth Intact   Pulmonary neg pulmonary ROS,  breath sounds clear to auscultation  Pulmonary exam normal       Cardiovascular hypertension, Pt. on medications Rhythm:Regular Rate:Normal     Neuro/Psych negative neurological ROS  negative psych ROS   GI/Hepatic Neg liver ROS, hiatal hernia, GERD-  Medicated and Controlled,  Endo/Other  negative endocrine ROS  Renal/GU negative Renal ROS  negative genitourinary   Musculoskeletal negative musculoskeletal ROS (+)   Abdominal   Peds negative pediatric ROS (+)  Hematology negative hematology ROS (+)   Anesthesia Other Findings   Reproductive/Obstetrics negative OB ROS                         Anesthesia Physical Anesthesia Plan  ASA: II  Anesthesia Plan: General   Post-op Pain Management:    Induction: Intravenous  Airway Management Planned: LMA  Additional Equipment:   Intra-op Plan:   Post-operative Plan: Extubation in OR  Informed Consent: I have reviewed the patients History and Physical, chart, labs and discussed the procedure including the risks, benefits and alternatives for the proposed anesthesia with the patient or authorized representative who has indicated his/her understanding and acceptance.   Dental advisory given  Plan Discussed with: CRNA  Anesthesia Plan Comments:         Anesthesia Quick Evaluation

## 2011-10-14 NOTE — Interval H&P Note (Signed)
History and Physical Interval Note:  No change in H and P  10/14/2011 10:57 AM  Darin [name]  has presented today for surgery, with the diagnosis of right inguinal hernia  The various methods of treatment have been discussed with the patient and family. After consideration of risks, benefits and other options for treatment, the patient has consented to  Procedure(s) (LRB): HERNIA REPAIR INGUINAL ADULT (Right) INSERTION OF MESH (N/A) as a surgical intervention .  The patient's history has been reviewed, patient examined, no change in status, stable for surgery.  I have reviewed the patient's chart and labs.  Questions were answered to the patient's satisfaction.     [name] A

## 2011-10-14 NOTE — Anesthesia Procedure Notes (Signed)
Procedure Name: LMA Insertion Date/Time: 10/14/2011 12:17 PM Performed by: Keosha Rossa S Pre-anesthesia Checklist: Timeout performed, Emergency Drugs available, Patient identified, Suction available and Patient being monitored Patient Re-evaluated:Patient Re-evaluated prior to inductionOxygen Delivery Method: Circle system utilized Preoxygenation: Pre-oxygenation with 100% oxygen Intubation Type: IV induction Ventilation: Mask ventilation without difficulty LMA: LMA inserted LMA Size: 4.0 Tube type: Oral Number of attempts: 1 Placement Confirmation: positive ETCO2 and breath sounds checked- equal and bilateral Tube secured with: Tape Dental Injury: Teeth and Oropharynx as per pre-operative assessment

## 2011-10-14 NOTE — Preoperative (Signed)
Beta Blockers   Reason not to administer Beta Blockers:Took beta blocker (Atenolol) last night at 10pm

## 2011-10-14 NOTE — Transfer of Care (Signed)
Immediate Anesthesia Transfer of Care Note  Patient: Darin [name]  Procedure(s) Performed: Procedure(s) (LRB): HERNIA REPAIR INGUINAL ADULT (Right) INSERTION OF MESH (Right)  Patient Location: PACU  Anesthesia Type: General  Level of Consciousness: awake, alert  and oriented  Airway & Oxygen Therapy: Patient Spontanous Breathing and Patient connected to nasal cannula oxygen  Post-op Assessment: Report given to PACU RN and Post -op Vital signs reviewed and stable  Post vital signs: Reviewed and stable  Complications: No apparent anesthesia complications

## 2011-10-14 NOTE — Op Note (Signed)
HERNIA REPAIR INGUINAL ADULT, INSERTION OF MESH  Procedure Note  Robben Jagiello 10/14/2011   Pre-op Diagnosis: right inguinal hernia     Post-op Diagnosis: same  Procedure(s): HERNIA REPAIR INGUINAL ADULT INSERTION OF MESH (Progrip)  Surgeon(s): Shelly Rubenstein, MD  Anesthesia: General  Staff:  Denese Killings, CST - Scrub Person Doy Mince, RN - Circulator Assistant Isidoro Donning, RN - Relief Circulator Arvil Persons, RN - Circulator Gerre Pebbles Sipsis, RN - Scrub Person  Estimated Blood Loss: Minimal               Procedure: The patient was brought to the operating room and identified as the correct patient. He was placed supine on the operating room table and general anesthesia was induced. His right lower quadrant groin was then prepped and draped in the usual sterile fashion. I performed an ilioinguinal nerve block with Marcaine. I then anesthetized the skin on the right eye with Marcaine as well. I then made a longitudinal incision with a scalpel in the right groin. I took this down through Scarpa's fascia with electrocautery. External oblique fascia was then opened the internal and external rings. The patient had a large direct hernia defect. I controlled the testicular cord structures a Penrose drain and saw no evidence of indirect hernia. I then imbricated the direct hernia sac with several Vicryl sutures. A piece of Prolene pro-grip mesh was brought to the field. I placed around the cord structures and laid on to the inguinal floor. I then sewed it in place with 2-0 Vicryl suture. Good coverage of the inguinal floor appeared to be achieved. I then closed the external leak fashion over the top of this with a running 2-0 Vicryl suture. I anesthetized the fascia further with Marcaine. I then closed the subcutaneous tissue with interrupted 3-0 Vicryl sutures and closed the skin with a running 4-0 Monocryl. Steri-Strips, gauze, and tape were then applied. The  patient tolerated the procedure well. All the counts were correct at the end of the procedure. The patient was then extubated in the operating room and taken in stable condition to the recovery room.          Joselynn Amoroso A   Date: 10/14/2011  Time: 12:46 PM

## 2011-10-15 ENCOUNTER — Encounter (HOSPITAL_COMMUNITY): Payer: Self-pay | Admitting: Surgery

## 2011-10-29 ENCOUNTER — Encounter (INDEPENDENT_AMBULATORY_CARE_PROVIDER_SITE_OTHER): Payer: Self-pay | Admitting: Surgery

## 2011-10-29 ENCOUNTER — Ambulatory Visit (INDEPENDENT_AMBULATORY_CARE_PROVIDER_SITE_OTHER): Payer: Medicare Other | Admitting: Surgery

## 2011-10-29 VITALS — BP 100/60 | HR 60 | Temp 96.3°F | Resp 18 | Ht 73.0 in | Wt 203.0 lb

## 2011-10-29 DIAGNOSIS — Z09 Encounter for follow-up examination after completed treatment for conditions other than malignant neoplasm: Secondary | ICD-10-CM

## 2011-10-29 NOTE — Progress Notes (Signed)
Subjective:     Patient ID: Darin [name], male   DOB: Jan 05, 1932, 78 y.o.   MRN: [id number]  HPI He is here for his postoperative visit status post right inguinal hernia repair with mesh. He is doing well and has no complaints.  Review of Systems     Objective:   Physical Exam    on exam, his incision is healing well. The Steri-Strips are still in place. There is minimal swelling and no evidence of recurrence Assessment:     Patient doing well status post right inguinal hernia repair with mesh.    Plan:     He will wait a total of 4 weeks postoperatively before returning to normal activity. I will see him back as needed

## 2011-11-27 ENCOUNTER — Telehealth: Payer: Self-pay | Admitting: Internal Medicine

## 2011-11-27 NOTE — Telephone Encounter (Signed)
Caller: Reise/Patient; Patient Name: Darin [name]; PCP: Darryll Capers (Adults only); Best Callback Phone Number: [phone]. Call regarding left great toe with ingrown toenail. Patient has an appointment with Dr. Leeanne Deed (podiatrist) regarding this issue. Onset 11/17/11. Reports that Dr. Leeanne Deed is not able to see him until 12/03/11 and was directed to call PCP to see if they wanted to do any prophylactic antibiotics. Reports pain, redness, swelling and mild bleeding from the great toe. Emergent symptom of "Any signs and symptoms of localized infection that has not improved or is worsening with 24 hours of home care or area of involvement is extending" positive per Foot Non-injury guideline. Disposition: See provider within 4 hours. Dr. Lovell Sheehan is not working today per the schedule in EPIC. No appointments within the next 4 hours are available with Adline Mango, PA. PLEASE CALL THE PATIENT BACK REGARDING WHETHER HE NEEDS TO BE SEEN OR IF HE CAN GET A PRESCRIPTION. Thanks.

## 2011-11-27 NOTE — Telephone Encounter (Signed)
pls advise

## 2011-11-27 NOTE — Telephone Encounter (Signed)
Schedule OV with next available 

## 2011-11-28 MED ORDER — CEPHALEXIN 500 MG PO CAPS: 500.0000 mg | ORAL_CAPSULE | Freq: Four times a day (QID) | ORAL | Status: AC

## 2011-11-28 NOTE — Telephone Encounter (Signed)
rx sent in electronically, pt aware 

## 2011-11-28 NOTE — Telephone Encounter (Signed)
Pt can not come in this week cause its a jewish holiday.  Told pt I would send this message to Dr Lovell Sheehan but was unsure if I would get a quick response.  Pt understood and said if he had to wait till Tuesday then he would

## 2011-11-28 NOTE — Telephone Encounter (Signed)
Call in keflex 500 qid for 7 dyas

## 2011-12-03 DIAGNOSIS — L03039 Cellulitis of unspecified toe: Secondary | ICD-10-CM | POA: Diagnosis not present

## 2011-12-19 ENCOUNTER — Encounter: Payer: Self-pay | Admitting: Internal Medicine

## 2011-12-19 ENCOUNTER — Ambulatory Visit (INDEPENDENT_AMBULATORY_CARE_PROVIDER_SITE_OTHER): Payer: Medicare Other | Admitting: Internal Medicine

## 2011-12-19 VITALS — BP 120/60 | HR 72 | Temp 98.3°F | Resp 16 | Ht 73.0 in | Wt 208.0 lb

## 2011-12-19 DIAGNOSIS — K219 Gastro-esophageal reflux disease without esophagitis: Secondary | ICD-10-CM

## 2011-12-19 DIAGNOSIS — M5126 Other intervertebral disc displacement, lumbar region: Secondary | ICD-10-CM | POA: Diagnosis not present

## 2011-12-19 DIAGNOSIS — I10 Essential (primary) hypertension: Secondary | ICD-10-CM

## 2011-12-19 DIAGNOSIS — D638 Anemia in other chronic diseases classified elsewhere: Secondary | ICD-10-CM | POA: Diagnosis not present

## 2011-12-19 DIAGNOSIS — D518 Other vitamin B12 deficiency anemias: Secondary | ICD-10-CM

## 2011-12-19 DIAGNOSIS — D0460 Carcinoma in situ of skin of unspecified upper limb, including shoulder: Secondary | ICD-10-CM | POA: Diagnosis not present

## 2011-12-19 DIAGNOSIS — D519 Vitamin B12 deficiency anemia, unspecified: Secondary | ICD-10-CM

## 2011-12-19 DIAGNOSIS — G63 Polyneuropathy in diseases classified elsewhere: Secondary | ICD-10-CM

## 2011-12-19 DIAGNOSIS — C44611 Basal cell carcinoma of skin of unspecified upper limb, including shoulder: Secondary | ICD-10-CM

## 2011-12-19 MED ORDER — CYANOCOBALAMIN 1000 MCG/ML IJ SOLN
1000.0000 ug | INTRAMUSCULAR | Status: DC
  Administered 2011-12-19: 1000 ug via INTRAMUSCULAR

## 2011-12-19 NOTE — Progress Notes (Signed)
Subjective:    Patient ID: Darin [name], male    DOB: 1931/06/27, 78 y.o.   MRN: [id number]  HPI Is a 78 year old male who presents for followup of hypertension diffuse osteoarthritis or polyarticular arthritis with history of polymyalgia rheumatica.  And a history of gastroesophageal reflux he also has a history of anemia for which we should monitor a CBC and differential    Review of Systems  Constitutional: Positive for fatigue. Negative for fever.  HENT: Positive for congestion and rhinorrhea. Negative for hearing loss, neck pain and postnasal drip.   Eyes: Negative for discharge, redness and visual disturbance.  Respiratory: Negative for cough, shortness of breath and wheezing.   Cardiovascular: Negative for palpitations and leg swelling.  Gastrointestinal: Negative for abdominal pain, constipation and abdominal distention.  Genitourinary: Positive for urgency. Negative for frequency.  Musculoskeletal: Positive for myalgias, back pain, joint swelling, arthralgias and gait problem.  Skin: Positive for wound. Negative for color change and rash.       Nonhealing ulcer on the left shoulder posterior  Neurological: Positive for weakness and numbness. Negative for light-headedness.  Hematological: Negative for adenopathy.  Psychiatric/Behavioral: Positive for confusion. Negative for behavioral problems.       Past Medical History  Diagnosis Date  . GERD (gastroesophageal reflux disease)   . Hyperlipidemia   . Hypertension   . Arthritis   . PMR (polymyalgia rheumatica)   . Knee pain   . Osteoporosis   . H/O hiatal hernia     "FOR YEARS" " NO PROBLEM"  . Neuropathy     PERIPHERAL   . Polymyalgia 5 YEARS    History   Social History  . Marital Status: Widowed    Spouse Name: N/A    Number of Children: N/A  . Years of Education: N/A   Occupational History  . retired    Social History Main Topics  . Smoking status: Never Smoker   . Smokeless tobacco: Not on file  .  Alcohol Use: Yes  . Drug Use: No  . Sexually Active: Yes   Other Topics Concern  . Not on file   Social History Narrative  . No narrative on file    Past Surgical History  Procedure Date  . Tonsillectomy   . Carpal tunnel release   . Varicose vein surgery   . Cataract extraction     right  . Inguinal hernia repair 10/14/2011    Procedure: HERNIA REPAIR INGUINAL ADULT;  Surgeon: Shelly Rubenstein, MD;  Location: [facility] OR;  Service: General;  Laterality: Right;  Right Inguinal Hernia Repair with Mesh    Family History  Problem Relation Age of Onset  . Coronary artery disease    . Heart disease Father     Allergies  Allergen Reactions  . Statins Other (See Comments)    Muscle pain and upset stomach    Current Outpatient Prescriptions on File Prior to Visit  Medication Sig Dispense Refill  . aspirin 81 MG tablet Take 81 mg by mouth daily.        Marland Kitchen atenolol (TENORMIN) 50 MG tablet Take 0.5 tablets (25 mg total) by mouth daily.  90 tablet  3  . benazepril-hydrochlorthiazide (LOTENSIN HCT) 10-12.5 MG per tablet Take 1 tablet by mouth daily.  90 tablet  3  . Calcium Carb-Cholecalciferol (CALCIUM 500 +D) 500-400 MG-UNIT TABS Take by mouth 2 (two) times daily.        . famotidine (PEPCID) 20 MG tablet Take 20 mg by mouth  2 (two) times daily.      . fish oil-omega-3 fatty acids 1000 MG capsule Take 2 g by mouth 2 (two) times daily.        . Folic Acid-Vit B6-Vit B12 (FOLBEE) 2.5-25-1 MG TABS Take 1 tablet by mouth daily.  90 tablet  3  . magnesium oxide (MAG-OX 400) 400 MG tablet Take 1 tablet (400 mg total) by mouth daily.  1 tablet  11  . niacin 500 MG tablet Take 500 mg by mouth 2 (two) times daily as needed.        . polyethylene glycol (MIRALAX / GLYCOLAX) packet Take 17 g by mouth 2 (two) times daily.        . saw palmetto 80 MG capsule Take 80 mg by mouth daily.        . vardenafil (LEVITRA) 20 MG tablet Take 1 tablet (20 mg total) by mouth daily as needed.  10 tablet  6  .  vitamin C (ASCORBIC ACID) 500 MG tablet Take 500 mg by mouth daily.       Current Facility-Administered Medications on File Prior to Visit  Medication Dose Route Frequency Provider Last Rate Last Dose  . cyanocobalamin ((VITAMIN B-12)) injection 1,000 mcg  1,000 mcg Intramuscular Q30 days Stacie Glaze, MD   1,000 mcg at 09/16/11 1413    BP 120/60  Pulse 72  Temp 98.3 F (36.8 C)  Resp 16  Ht 6\' 1"  (1.854 m)  Wt 208 lb (94.348 kg)  BMI 27.44 kg/m2    Objective:   Physical Exam  Nursing note and vitals reviewed. Constitutional: He appears well-developed and well-nourished.  HENT:  Head: Normocephalic and atraumatic.  Eyes: Conjunctivae normal are normal. Pupils are equal, round, and reactive to light.  Neck: Normal range of motion. Neck supple.  Cardiovascular: Normal rate and regular rhythm.   Murmur heard. Pulmonary/Chest: Effort normal and breath sounds normal.  Abdominal: Soft. Bowel sounds are normal.  Skin:       Nonhealing ulcer of the left posterior shoulder          Assessment & Plan:   Patient has a history of basal cell carcinoma and has a suspicious lesion on the posterior left stroke shoulder which we will treat initially with cryotherapy if however the lesion recurs he will require excisional therapy.  Informed consent was obtained in the lesion on the posterior left shoulder was treated for 60 seconds of liquid nitrogen application the patient tolerated the procedure well as procedural care was discussed with the patient and instructions should the lesion reappears contact our office immediately   Patient has polyarticular arthritis Is no spinal stenosis with resultant peripheral neuropathy of the lower extremity GERD stable Stable blood pressure  March CPX

## 2011-12-19 NOTE — Patient Instructions (Signed)
The patient is instructed to continue all medications as prescribed. Schedule followup with check out clerk upon leaving the clinic  

## 2012-01-07 DIAGNOSIS — L03039 Cellulitis of unspecified toe: Secondary | ICD-10-CM | POA: Diagnosis not present

## 2012-02-18 ENCOUNTER — Other Ambulatory Visit: Payer: Self-pay | Admitting: *Deleted

## 2012-02-18 MED ORDER — MELOXICAM 15 MG PO TABS: 15.0000 mg | ORAL_TABLET | Freq: Every day | ORAL | Status: DC

## 2012-03-09 ENCOUNTER — Other Ambulatory Visit: Payer: Self-pay | Admitting: *Deleted

## 2012-03-09 ENCOUNTER — Ambulatory Visit (INDEPENDENT_AMBULATORY_CARE_PROVIDER_SITE_OTHER): Payer: Medicare Other | Admitting: Internal Medicine

## 2012-03-09 VITALS — BP 140/80 | HR 72 | Temp 98.1°F | Resp 16 | Ht 73.0 in | Wt 212.0 lb

## 2012-03-09 DIAGNOSIS — R55 Syncope and collapse: Secondary | ICD-10-CM

## 2012-03-09 DIAGNOSIS — I10 Essential (primary) hypertension: Secondary | ICD-10-CM | POA: Diagnosis not present

## 2012-03-09 DIAGNOSIS — D649 Anemia, unspecified: Secondary | ICD-10-CM

## 2012-03-09 LAB — COMPREHENSIVE METABOLIC PANEL
BUN: 17 mg/dL (ref 6–23)
CO2: 32 mEq/L (ref 19–32)
Creatinine, Ser: 0.8 mg/dL (ref 0.4–1.5)
GFR: 102.2 mL/min (ref >60.00)
Glucose, Bld: 104 mg/dL — ABNORMAL HIGH (ref 70–99)
Total Bilirubin: 0.7 mg/dL (ref 0.3–1.2)

## 2012-03-09 LAB — CBC WITH DIFFERENTIAL/PLATELET
Eosinophils Relative: 0.9 % (ref 0.0–5.0)
HCT: 40.5 % (ref 39.0–52.0)
Hemoglobin: 13.7 g/dL (ref 13.0–17.0)
Lymphs Abs: 1 10*3/uL (ref 0.7–4.0)
Monocytes Relative: 12.2 % — ABNORMAL HIGH (ref 3.0–12.0)
Neutro Abs: 3.8 10*3/uL (ref 1.4–7.7)
WBC: 5.5 10*3/uL (ref 4.5–10.5)

## 2012-03-09 MED ORDER — MELOXICAM 15 MG PO TABS: 15.0000 mg | ORAL_TABLET | Freq: Every day | ORAL | Status: DC

## 2012-03-10 MED ORDER — CYANOCOBALAMIN 1000 MCG/ML IJ SOLN
1000.0000 ug | INTRAMUSCULAR | Status: AC
  Administered 2012-03-09: 1000 ug via INTRAMUSCULAR

## 2012-03-13 ENCOUNTER — Other Ambulatory Visit: Payer: Self-pay | Admitting: Internal Medicine

## 2012-03-16 ENCOUNTER — Ambulatory Visit: Payer: Medicare Other | Admitting: Internal Medicine

## 2012-03-29 ENCOUNTER — Inpatient Hospital Stay (HOSPITAL_COMMUNITY)
Admission: EM | Admit: 2012-03-29 | Discharge: 2012-03-31 | DRG: 312 | Disposition: A | Discharge status: Home Health | Payer: Medicare Other | Attending: Internal Medicine | Admitting: Internal Medicine

## 2012-03-29 ENCOUNTER — Encounter (HOSPITAL_COMMUNITY): Payer: Self-pay | Admitting: *Deleted

## 2012-03-29 ENCOUNTER — Other Ambulatory Visit: Payer: Self-pay

## 2012-03-29 ENCOUNTER — Observation Stay (HOSPITAL_COMMUNITY): Payer: Medicare Other

## 2012-03-29 ENCOUNTER — Emergency Department (HOSPITAL_COMMUNITY): Payer: Medicare Other

## 2012-03-29 DIAGNOSIS — E871 Hypo-osmolality and hyponatremia: Secondary | ICD-10-CM | POA: Diagnosis not present

## 2012-03-29 DIAGNOSIS — S02109A Fracture of base of skull, unspecified side, initial encounter for closed fracture: Secondary | ICD-10-CM | POA: Diagnosis present

## 2012-03-29 DIAGNOSIS — R5381 Other malaise: Secondary | ICD-10-CM | POA: Diagnosis present

## 2012-03-29 DIAGNOSIS — R6889 Other general symptoms and signs: Secondary | ICD-10-CM | POA: Diagnosis not present

## 2012-03-29 DIAGNOSIS — S0180XA Unspecified open wound of other part of head, initial encounter: Secondary | ICD-10-CM | POA: Diagnosis not present

## 2012-03-29 DIAGNOSIS — S0100XA Unspecified open wound of scalp, initial encounter: Secondary | ICD-10-CM | POA: Diagnosis present

## 2012-03-29 DIAGNOSIS — R55 Syncope and collapse: Secondary | ICD-10-CM | POA: Diagnosis not present

## 2012-03-29 DIAGNOSIS — M1990 Unspecified osteoarthritis, unspecified site: Secondary | ICD-10-CM

## 2012-03-29 DIAGNOSIS — S0285XA Fracture of orbit, unspecified, initial encounter for closed fracture: Secondary | ICD-10-CM | POA: Diagnosis present

## 2012-03-29 DIAGNOSIS — S0280XA Fracture of other specified skull and facial bones, unspecified side, initial encounter for closed fracture: Secondary | ICD-10-CM

## 2012-03-29 DIAGNOSIS — M353 Polymyalgia rheumatica: Secondary | ICD-10-CM | POA: Diagnosis present

## 2012-03-29 DIAGNOSIS — E785 Hyperlipidemia, unspecified: Secondary | ICD-10-CM | POA: Diagnosis present

## 2012-03-29 DIAGNOSIS — K219 Gastro-esophageal reflux disease without esophagitis: Secondary | ICD-10-CM | POA: Diagnosis not present

## 2012-03-29 DIAGNOSIS — Y92009 Unspecified place in unspecified non-institutional (private) residence as the place of occurrence of the external cause: Secondary | ICD-10-CM

## 2012-03-29 DIAGNOSIS — W19XXXA Unspecified fall, initial encounter: Secondary | ICD-10-CM | POA: Diagnosis present

## 2012-03-29 DIAGNOSIS — I10 Essential (primary) hypertension: Secondary | ICD-10-CM | POA: Diagnosis not present

## 2012-03-29 DIAGNOSIS — S020XXA Fracture of vault of skull, initial encounter for closed fracture: Secondary | ICD-10-CM | POA: Diagnosis not present

## 2012-03-29 DIAGNOSIS — F329 Major depressive disorder, single episode, unspecified: Secondary | ICD-10-CM

## 2012-03-29 DIAGNOSIS — W010XXA Fall on same level from slipping, tripping and stumbling without subsequent striking against object, initial encounter: Secondary | ICD-10-CM | POA: Diagnosis present

## 2012-03-29 DIAGNOSIS — M25519 Pain in unspecified shoulder: Secondary | ICD-10-CM

## 2012-03-29 DIAGNOSIS — G4762 Sleep related leg cramps: Secondary | ICD-10-CM

## 2012-03-29 DIAGNOSIS — G609 Hereditary and idiopathic neuropathy, unspecified: Secondary | ICD-10-CM | POA: Diagnosis present

## 2012-03-29 DIAGNOSIS — Z79899 Other long term (current) drug therapy: Secondary | ICD-10-CM

## 2012-03-29 DIAGNOSIS — E876 Hypokalemia: Secondary | ICD-10-CM | POA: Diagnosis present

## 2012-03-29 DIAGNOSIS — I6789 Other cerebrovascular disease: Secondary | ICD-10-CM | POA: Diagnosis present

## 2012-03-29 DIAGNOSIS — I951 Orthostatic hypotension: Principal | ICD-10-CM | POA: Diagnosis present

## 2012-03-29 LAB — HEPATIC FUNCTION PANEL
ALT: 16 U/L (ref 0–53)
Alkaline Phosphatase: 44 U/L (ref 39–117)
Bilirubin, Direct: <0.1 mg/dL (ref 0.0–0.3)
Total Protein: 5.9 g/dL — ABNORMAL LOW (ref 6.0–8.3)

## 2012-03-29 LAB — URINALYSIS, ROUTINE W REFLEX MICROSCOPIC
Bilirubin Urine: NEGATIVE
Leukocytes, UA: NEGATIVE
Nitrite: NEGATIVE
Specific Gravity, Urine: 1.019 (ref 1.005–1.030)
pH: 6 (ref 5.0–8.0)

## 2012-03-29 LAB — ETHANOL: Alcohol, Ethyl (B): <11 mg/dL (ref 0–11)

## 2012-03-29 LAB — RAPID URINE DRUG SCREEN, HOSP PERFORMED
Barbiturates: NOT DETECTED
Tetrahydrocannabinol: NOT DETECTED

## 2012-03-29 LAB — BASIC METABOLIC PANEL
CO2: 28 mEq/L (ref 19–32)
Chloride: 87 mEq/L — ABNORMAL LOW (ref 96–112)
Creatinine, Ser: 0.74 mg/dL (ref 0.50–1.35)

## 2012-03-29 LAB — CBC WITH DIFFERENTIAL/PLATELET
Basophils Absolute: 0 10*3/uL (ref 0.0–0.1)
HCT: 38.3 % — ABNORMAL LOW (ref 39.0–52.0)
Hemoglobin: 13.4 g/dL (ref 13.0–17.0)
Lymphocytes Relative: 6 % — ABNORMAL LOW (ref 12–46)
Monocytes Absolute: 0.8 10*3/uL (ref 0.1–1.0)
Monocytes Relative: 10 % (ref 3–12)
Neutro Abs: 6.7 10*3/uL (ref 1.7–7.7)
RDW: 12.9 % (ref 11.5–15.5)
WBC: 8 10*3/uL (ref 4.0–10.5)

## 2012-03-29 LAB — TROPONIN I: Troponin I: <0.3 ng/mL (ref <0.30)

## 2012-03-29 LAB — MAGNESIUM: Magnesium: 1.7 mg/dL (ref 1.5–2.5)

## 2012-03-29 MED ORDER — MORPHINE SULFATE 4 MG/ML IJ SOLN
4.0000 mg | Freq: Once | INTRAMUSCULAR | Status: AC
  Administered 2012-03-29: 4 mg via INTRAVENOUS
  Filled 2012-03-29: qty 1

## 2012-03-29 MED ORDER — ASPIRIN 81 MG PO TABS: 81.0000 mg | ORAL_TABLET | Freq: Every day | ORAL | Status: DC

## 2012-03-29 MED ORDER — MAGNESIUM OXIDE 400 MG PO TABS: 400.0000 mg | ORAL_TABLET | Freq: Every day | ORAL | Status: DC

## 2012-03-29 MED ORDER — OXYCODONE HCL 5 MG PO TABS: 5.0000 mg | ORAL_TABLET | ORAL | Status: DC | PRN

## 2012-03-29 MED ORDER — VITAMIN C 500 MG PO TABS
500.0000 mg | ORAL_TABLET | Freq: Every day | ORAL | Status: DC
  Administered 2012-03-29 – 2012-03-31 (×3): 500 mg via ORAL
  Filled 2012-03-29 (×3): qty 1

## 2012-03-29 MED ORDER — FAMOTIDINE 40 MG PO TABS
40.0000 mg | ORAL_TABLET | Freq: Every day | ORAL | Status: DC
Start: 2012-03-29 — End: 2012-03-31
  Administered 2012-03-29 – 2012-03-30 (×2): 40 mg via ORAL
  Filled 2012-03-29 (×3): qty 1

## 2012-03-29 MED ORDER — SODIUM CHLORIDE 0.9 % IV BOLUS (SEPSIS)
1000.0000 mL | Freq: Once | INTRAVENOUS | Status: AC
  Administered 2012-03-29: 1000 mL via INTRAVENOUS

## 2012-03-29 MED ORDER — POLYETHYLENE GLYCOL 3350 17 G PO PACK
17.0000 g | PACK | Freq: Every day | ORAL | Status: DC
  Administered 2012-03-30 – 2012-03-31 (×2): 17 g via ORAL
  Filled 2012-03-29 (×3): qty 1

## 2012-03-29 MED ORDER — ASPIRIN EC 81 MG PO TBEC
81.0000 mg | DELAYED_RELEASE_TABLET | Freq: Every day | ORAL | Status: DC
  Administered 2012-03-29 – 2012-03-30 (×2): 81 mg via ORAL
  Filled 2012-03-29 (×3): qty 1

## 2012-03-29 MED ORDER — SODIUM CHLORIDE 0.9 % IJ SOLN
3.0000 mL | Freq: Two times a day (BID) | INTRAMUSCULAR | Status: DC
  Administered 2012-03-30 – 2012-03-31 (×2): 3 mL via INTRAVENOUS

## 2012-03-29 MED ORDER — MORPHINE SULFATE 2 MG/ML IJ SOLN: 1.0000 mg | INTRAMUSCULAR | Status: DC | PRN

## 2012-03-29 MED ORDER — ACETAMINOPHEN 325 MG PO TABS
650.0000 mg | ORAL_TABLET | Freq: Four times a day (QID) | ORAL | Status: DC | PRN
  Administered 2012-03-30 (×2): 650 mg via ORAL
  Filled 2012-03-29 (×2): qty 2

## 2012-03-29 MED ORDER — MAGNESIUM OXIDE 400 (241.3 MG) MG PO TABS
400.0000 mg | ORAL_TABLET | Freq: Every day | ORAL | Status: DC
  Administered 2012-03-29 – 2012-03-31 (×3): 400 mg via ORAL
  Filled 2012-03-29 (×3): qty 1

## 2012-03-29 MED ORDER — ONDANSETRON HCL 4 MG/2ML IJ SOLN
4.0000 mg | Freq: Four times a day (QID) | INTRAMUSCULAR | Status: DC | PRN
  Administered 2012-03-29: 4 mg via INTRAVENOUS
  Filled 2012-03-29: qty 2

## 2012-03-29 MED ORDER — FOLIC ACID-VIT B6-VIT B12 2.5-25-1 MG PO TABS
1.0000 | ORAL_TABLET | Freq: Every day | ORAL | Status: DC
  Filled 2012-03-29: qty 1

## 2012-03-29 MED ORDER — FA-PYRIDOXINE-CYANOCOBALAMIN 2.5-25-2 MG PO TABS
1.0000 | ORAL_TABLET | Freq: Every day | ORAL | Status: DC
  Administered 2012-03-29 – 2012-03-31 (×3): 1 via ORAL
  Filled 2012-03-29 (×3): qty 1

## 2012-03-29 MED ORDER — ONDANSETRON HCL 4 MG PO TABS: 4.0000 mg | ORAL_TABLET | Freq: Four times a day (QID) | ORAL | Status: DC | PRN

## 2012-03-29 MED ORDER — OMEGA-3-ACID ETHYL ESTERS 1 G PO CAPS
2.0000 g | ORAL_CAPSULE | Freq: Two times a day (BID) | ORAL | Status: DC
  Administered 2012-03-29 – 2012-03-31 (×4): 2 g via ORAL
  Filled 2012-03-29 (×5): qty 2

## 2012-03-29 MED ORDER — ACETAMINOPHEN 650 MG RE SUPP: 650.0000 mg | Freq: Four times a day (QID) | RECTAL | Status: DC | PRN

## 2012-03-29 MED ORDER — NIACIN 500 MG PO TABS
500.0000 mg | ORAL_TABLET | Freq: Two times a day (BID) | ORAL | Status: DC
  Administered 2012-03-30 – 2012-03-31 (×3): 500 mg via ORAL
  Filled 2012-03-29 (×5): qty 1

## 2012-03-29 MED ORDER — OMEGA-3 FATTY ACIDS 1000 MG PO CAPS: 2.0000 g | ORAL_CAPSULE | Freq: Two times a day (BID) | ORAL | Status: DC

## 2012-03-29 MED ORDER — SODIUM CHLORIDE 0.9 % IV SOLN
INTRAVENOUS | Status: AC
  Administered 2012-03-30 (×2): via INTRAVENOUS

## 2012-03-29 NOTE — ED Provider Notes (Signed)
I was asked to repair pt's laceration.   LACERATION REPAIR Performed by: Lottie Mussel Authorized by: Jaynie Crumble A Consent: Verbal consent obtained. Risks and benefits: risks, benefits and alternatives were discussed Consent given by: patient Patient identity confirmed: provided demographic data Prepped and Draped in normal sterile fashion Wound explored  Laceration Location: left forehead  Laceration Length: 5cm irregular  No Foreign Bodies seen or palpated  Anesthesia: local infiltration  Local anesthetic: lidocaine 2% wo epinephrine  Anesthetic total: 3 ml  Irrigation method: syringe Amount of cleaning: standard  Skin closure: nylon 6.0  Number of sutures: 10  Technique: simple interrupted  Patient tolerance: Patient tolerated the procedure well with no immediate complications.   Lottie Mussel, PA 03/29/12 [phone]

## 2012-03-29 NOTE — H&P (Signed)
[facility] History and Physical  Darin [name] WUJ:[id number] DOB: [birth year] DOA: 03/29/2012  Referring physician: Dr. Rhunette Croft PCP: Carrie Mew, MD    Chief Complaint: Syncope and collapse  HPI: Darin [name] is a 79 y.o. male with a past medical history significant for hypertension, hyperlipidemia, gastroesophageal reflux disease, PMR and arthritis; came to the hospital after experiencing an episode of syncope and collapse. Patient states that he was trying to go to the bathroom and he just passed out. There was no prodrome symptoms associated or precipitating the syncope event (no chest pain, palpitations, diaphoresis, shortness of breath, dizziness, blurred vision, vertigo or lightheadedness). Patient reports that he has had approximately a month ago while he was in [place] another episode of syncope no evaluation was done at that time. He denies any new medications and also changes in his lifestyle recently. Reports that he has been eating and drinking properly. As a consequence of that syncope event patient has laceration on his left scalp and also periorbital hematoma.   In the ED patient was found to be hyponatremic, hypokalemic, no signs of infection A. his urinalysis, negative troponin and no acute changes on his EKG; CT of the head demonstrated chronic ischemic microvascular white matter disease but no acute stroke; that is her left frontal bone fracture that extends through the frontal sinus on probably into the left anterior cranial fossa. A maxillofacial CT has been order and is pending at this moment for further evaluation of this orbital fracture.   [facility] has been called to admit the patient for further evaluation and treatment of his syncope episode.  Review of Systems:  Negative except as otherwise mentioned on history of present illness.  Past Medical History  Diagnosis Date  . GERD (gastroesophageal reflux disease)   . Hyperlipidemia   .  Hypertension   . Arthritis   . PMR (polymyalgia rheumatica)   . Knee pain   . Osteoporosis   . H/O hiatal hernia     "FOR YEARS" " NO PROBLEM"  . Neuropathy     PERIPHERAL   . Polymyalgia 5 YEARS   Past Surgical History  Procedure Date  . Tonsillectomy   . Carpal tunnel release   . Varicose vein surgery   . Cataract extraction     right  . Inguinal hernia repair 10/14/2011    Procedure: HERNIA REPAIR INGUINAL ADULT;  Surgeon: Shelly Rubenstein, MD;  Location: [facility] OR;  Service: General;  Laterality: Right;  Right Inguinal Hernia Repair with Mesh   Social History:  reports that he has never smoked. He has never used smokeless tobacco. He reports that he drinks alcohol. He reports that he does not use illicit drugs. lives at home by himself and reports no need for assistance with activities of daily living.   Allergies  Allergen Reactions  . Statins Other (See Comments)    Muscle pain and upset stomach    Family History  Problem Relation Age of Onset  . Coronary artery disease    . Heart disease Father     Prior to Admission medications   Medication Sig Start Date End Date Taking? Authorizing Provider  aspirin 81 MG tablet Take 81 mg by mouth at bedtime.    Yes Historical Provider, MD  atenolol (TENORMIN) 50 MG tablet Take 0.5 tablets (25 mg total) by mouth daily. 08/13/11  Yes Stacie Glaze, MD  benazepril-hydrochlorthiazide (LOTENSIN HCT) 10-12.5 MG per tablet Take 1 tablet by mouth daily. 08/13/11  Yes John  Carolynn Sayers, MD  Calcium Carb-Cholecalciferol (CALCIUM 500 +D) 500-400 MG-UNIT TABS Take by mouth 2 (two) times daily.     Yes Historical Provider, MD  famotidine (PEPCID) 20 MG tablet Take 20 mg by mouth at bedtime.    Yes Historical Provider, MD  fish oil-omega-3 fatty acids 1000 MG capsule Take 2 g by mouth 2 (two) times daily.     Yes Historical Provider, MD  Folic Acid-Vit B6-Vit B12 (FOLBEE) 2.5-25-1 MG TABS Take 1 tablet by mouth daily. 05/03/11  Yes Stacie Glaze, MD   LEVITRA 20 MG tablet TAKE 1 TABLET DAILY AS NEEDED. 03/13/12  Yes Stacie Glaze, MD  magnesium oxide (MAG-OX 400) 400 MG tablet Take 1 tablet (400 mg total) by mouth daily. 07/05/11 07/04/12 Yes Stacie Glaze, MD  meloxicam (MOBIC) 15 MG tablet Take 1 tablet (15 mg total) by mouth daily. 03/09/12  Yes Stacie Glaze, MD  niacin 500 MG tablet Take 500 mg by mouth 2 (two) times daily with a meal.    Yes Historical Provider, MD  polyethylene glycol (MIRALAX / GLYCOLAX) packet Take 17 g by mouth 2 (two) times daily.     Yes Historical Provider, MD  saw palmetto 80 MG capsule Take 80 mg by mouth daily.     Yes Historical Provider, MD  vitamin C (ASCORBIC ACID) 500 MG tablet Take 500 mg by mouth daily.   Yes Historical Provider, MD   Physical Exam: Filed Vitals:   03/29/12 1401 03/29/12 1444 03/29/12 1447 03/29/12 1448  BP: 169/69 146/52 140/47 71/48  Pulse: 83 81 88 79  Temp: 99 F (37.2 C)     TempSrc: Oral     Resp: 20     SpO2: 100%        General: Alert, awake and oriented x3, cooperative to examination, no acute distress. Afebrile  Eyes: No icterus, no nystagmus, PERRLA, extraocular muscles intact. Patient with left orbital swelling/bruising/tenderness after trauma.  ENT: No erythema or exudate inside his mouth, patient with dry blood inside his nose and with symptoms consistent with nasal congestion (postnasal drip and muffled voice); no discharges of his ears  Neck: Supple, no thyromegaly, no bruits and with full range of motion  Cardiovascular: Regular rate and rhythm, no murmurs, no gallops, no rubs; S1 and S2 appreciated on exam.  Respiratory: Clear to auscultation bilaterally.  Abdomen: Soft, nontender, nondistended, positive bowel sounds; no guarding  Skin: Periorbital hematoma in 2-3 lacerations on his left forehead; no rashes or petechiae.  Musculoskeletal: Full range of motion, no joint swelling or erythema.  Psychiatric: Flat affect, otherwise appropriate mood; no  suicidal ideation or hallucinations.  Neurologic: Alert, awake and oriented x3, muscle strength 5 out of 5 bilaterally symmetrically, cranial nerve grossly intact, no uvula or tongue deviation and without facial droop.  Labs on Admission:  Basic Metabolic Panel:  Lab 03/29/12 1610  NA 125*  K 3.7  CL 87*  CO2 28  GLUCOSE 120*  BUN 16  CREATININE 0.74  CALCIUM 9.5  MG --  PHOS --   CBC:  Lab 03/29/12 1434  WBC 8.0  NEUTROABS 6.7  HGB 13.4  HCT 38.3*  MCV 91.6  PLT 163   Cardiac Enzymes:  Lab 03/29/12 1434  CKTOTAL --  CKMB --  CKMBINDEX --  TROPONINI <0.30    Radiological Exams on Admission: Ct Head Wo Contrast  03/29/2012  *RADIOLOGY REPORT*  Clinical Data: Fall.  Headache.  Left periorbital bruising.  CT HEAD WITHOUT  CONTRAST  Technique:  Contiguous axial images were obtained from the base of the skull through the vertex without contrast.  Comparison: None.  Findings: The brain stem, cerebellum, thalami, basal ganglia, ventricular system, and basilar cisterns appear unremarkable. Periventricular and corona radiata white matter hypodensities are most compatible with chronic ischemic microvascular white matter disease.  No intracranial hemorrhage, mass lesion, or acute infarction is identified.  Left periorbital hematoma noted with a fracture of the orbital roof and superior orbital rim extending into the left frontal sinus, with fluid in the left frontal sinus.  The fracture of the superior orbital rim appears to be segmental, with a slightly depressed fragment into the frontal sinus.  I suspect a nondisplaced fracture extension through the left frontal sinus into the anterior cranial fossa on the left, with a minuscule amount of gas tracking adjacent to the superior sagittal sinus/falx.  There is intraorbital hematoma on the left which appears to be primarily extraconal, as well as orbital gas.  Moreover, there is an air-fluid level in the left maxillary sinus which raises the  possibility of an orbital floor fracture or maxillary fracture not included on today's exam. The visualized portion of the left zygomatic arch appears intact.  Atherosclerotic calcification of the carotid siphons noted.  IMPRESSION:  1.  Left frontal bone fracture extends through the frontal sinus and probably into the left anterior cranial fossa, with a segmental fracture of the left superior orbital rim mildly posteriorly displaced, with left superior orbital extraconal hematoma and gas. Equivocal pneumocephalus. 2.  Fluid in the left maxillary sinus raises the likelihood of the left inferior orbital or maxillary fracture which is not included on today's examination.  Dedicated maxillofacial CT is recommended for further characterization of the craniofacial fractures. 3. Periventricular and corona radiata white matter hypodensities are most compatible with chronic ischemic microvascular white matter disease.   Original Report Authenticated By: Gaylyn Rong, M.D.     EKG:  Rate: 80  Rhythm: normal sinus rhythm  QRS Axis: normal  Intervals: normal  ST/T Wave abnormalities: normal  Conduction Disutrbances:none  Narrative Interpretation:  Old EKG Reviewed: none available  Assessment/Plan 1-Syncope and collapse: Differential diagnosis including arrhythmias, orthostatic hypotension, vasovagal/neurocardiogenic syncope, T. artery disease. In ED patient was found to be hyponatremic with a sodium of 125 and also hypochloremic. His potassium was within normal limits. Concerns for adrenal disease. -Will admit to telemetry -Check TSH, B12, cortisol level, troponin X 3, UDS, and alcohol level. -CT head in the ED with no acute abnormalities the patient neurologic exam within normal limits making less likely to be secondary to CVA. Will hold on MRI at this moment. -Will check carotid Dopplers and 2-D echo -No signs or source of infection appreciated on initial workup. -Will hold antihypertensive drugs and  provide fluid resuscitation. -Will check orthostatic vital signs in last physical therapy to Evaluate the patient -Neuro checks every 4 hours x24 hours.  2-HYPERLIPIDEMIA: Will check lipid panel. Continue fish oil.  3-HYPERTENSION: Will hold antihypertensive medication and provide fluid resuscitation for now.  4-GERD: Continue PPI  5-Hyponatremia: As mentioned above. Will check TSH, cortisol level and provide fluid resuscitation. HCTZ playing a role on patient's hyponatremia will be place on hold.  6-Depression: Stable. Patient was using saw palmetto as an outpatient. Medication will be placed on hold while in the hospital.  7-Fracture, orbital: Will provide pain medication  As needed and follow maxillofacial CT to to avoid extension of orbital fracture. No sedation has been suture by ED  physician.  DVT prophylaxis: SCDs.   Code Status: Full Family Communication: no family at bedside Disposition Plan: Will admit for observation, telemetry bed, syncope workup.  Time spent: >30 minutes  Darin [name] [facility] Pager [phone]  If 7PM-7AM, please contact night-coverage [email or web address] Password [email or web address] 03/29/2012, 4:55 PM

## 2012-03-29 NOTE — ED Notes (Signed)
ZOX:[id number]<AV> Expected date:03/29/12<BR> Expected time: 1:53 PM<BR> Means of arrival:Ambulance<BR> Comments:<BR> Fall, lacerations

## 2012-03-29 NOTE — ED Notes (Signed)
Took report on 4E stated not to bring pt to floor until after shift change

## 2012-03-29 NOTE — ED Notes (Signed)
Per EMS pt from home, called for bleeding from the head, pt does not remember how, glasses are broken, pt a/o x 3, nose bleeds on and off today, laceration above L eye about an inch, bleeding controlled at this time, blood on floor when EMS arrived, denies pain feels dizzy, no deformities notied, BP 184/78, CBG 117, HR 70, RR 18, oxygen 100%.

## 2012-03-29 NOTE — ED Provider Notes (Addendum)
History     CSN: [id number]  Arrival date & time 03/29/12  1355   First MD Initiated Contact with Patient 03/29/12 1401      No chief complaint on file.   (Consider location/radiation/quality/duration/timing/severity/associated sxs/prior treatment) HPI Comments: Pt comes in with cc of syncope. Pt has no known hx of CHF. States that he was trying to go the bathroom, and he just passed out. He had no prodrome - no chest pain, palpitations, sob, dizziness, vertigo. Pt had syncope a month ago while in [place] in November -no evaluation done thereafter. He has no new meds. Pt has a large bruise and laceration around the left eye - no vision complains. He is having a headache.  The history is provided by the patient.    Past Medical History  Diagnosis Date  . GERD (gastroesophageal reflux disease)   . Hyperlipidemia   . Hypertension   . Arthritis   . PMR (polymyalgia rheumatica)   . Knee pain   . Osteoporosis   . H/O hiatal hernia     "FOR YEARS" " NO PROBLEM"  . Neuropathy     PERIPHERAL   . Polymyalgia 5 YEARS    Past Surgical History  Procedure Date  . Tonsillectomy   . Carpal tunnel release   . Varicose vein surgery   . Cataract extraction     right  . Inguinal hernia repair 10/14/2011    Procedure: HERNIA REPAIR INGUINAL ADULT;  Surgeon: Shelly Rubenstein, MD;  Location: [facility] OR;  Service: General;  Laterality: Right;  Right Inguinal Hernia Repair with Mesh    Family History  Problem Relation Age of Onset  . Coronary artery disease    . Heart disease Father     History  Substance Use Topics  . Smoking status: Never Smoker   . Smokeless tobacco: Never Used  . Alcohol Use: Yes      Review of Systems  Constitutional: Negative for fever, chills and activity change.  HENT: Negative for neck pain.   Eyes: Negative for visual disturbance.  Respiratory: Negative for cough, chest tightness and shortness of breath.   Cardiovascular: Negative for chest pain.    Gastrointestinal: Negative for abdominal distention.  Genitourinary: Negative for dysuria, enuresis and difficulty urinating.  Musculoskeletal: Negative for arthralgias.  Skin: Positive for wound.  Neurological: Positive for syncope and headaches. Negative for dizziness and light-headedness.  Hematological: Does not bruise/bleed easily.  Psychiatric/Behavioral: Negative for confusion.    Allergies  Statins  Home Medications   Current Outpatient Rx  Name  Route  Sig  Dispense  Refill  . ASPIRIN 81 MG PO TABS   Oral   Take 81 mg by mouth at bedtime.          . ATENOLOL 50 MG PO TABS   Oral   Take 0.5 tablets (25 mg total) by mouth daily.   90 tablet   3   . BENAZEPRIL-HYDROCHLOROTHIAZIDE 10-12.5 MG PO TABS   Oral   Take 1 tablet by mouth daily.   90 tablet   3   . CALCIUM CARB-CHOLECALCIFEROL 500-400 MG-UNIT PO TABS   Oral   Take by mouth 2 (two) times daily.           Marland Kitchen FAMOTIDINE 20 MG PO TABS   Oral   Take 20 mg by mouth at bedtime.          . OMEGA-3 FATTY ACIDS 1000 MG PO CAPS   Oral   Take 2 g  by mouth 2 (two) times daily.           Marland Kitchen FOLIC ACID-VIT B6-VIT B12 2.5-25-1 MG PO TABS   Oral   Take 1 tablet by mouth daily.   90 tablet   3   . LEVITRA 20 MG PO TABS      TAKE 1 TABLET DAILY AS NEEDED.   8 tablet   0   . MAGNESIUM OXIDE 400 MG PO TABS   Oral   Take 1 tablet (400 mg total) by mouth daily.   1 tablet   11   . MELOXICAM 15 MG PO TABS   Oral   Take 1 tablet (15 mg total) by mouth daily.   90 tablet   3   . NIACIN 500 MG PO TABS   Oral   Take 500 mg by mouth 2 (two) times daily with a meal.          . POLYETHYLENE GLYCOL 3350 PO PACK   Oral   Take 17 g by mouth 2 (two) times daily.           . SAW PALMETTO (SERENOA REPENS) 80 MG PO CAPS   Oral   Take 80 mg by mouth daily.           Marland Kitchen VITAMIN C 500 MG PO TABS   Oral   Take 500 mg by mouth daily.           BP 71/48  Pulse 79  Temp 99 F (37.2 C) (Oral)   Resp 20  SpO2 100%  Physical Exam  Constitutional: He is oriented to person, place, and time. He appears well-developed.  HENT:  Head: Normocephalic and atraumatic.  Eyes: Conjunctivae normal and EOM are normal. Pupils are equal, round, and reactive to light.  Neck: Normal range of motion. Neck supple.       No midline c-spine tenderness, pt able to turn head to 45 degrees bilaterally without any pain and able to flex neck to the chest and extend without any pain or neurologic symptoms.  Cardiovascular: Normal rate and regular rhythm.   Pulmonary/Chest: Effort normal and breath sounds normal.  Abdominal: Soft. Bowel sounds are normal. He exhibits no distension. There is no tenderness. There is no rebound and no guarding.  Musculoskeletal:       Head to toe evaluation shows periorbital hematoma and 2-3 lacerations no scalp hematoma, bleeding of the scalp, no facial abrasions, step offs, crepitus, no tenderness to palpation of the bilateral upper and lower extremities, no gross deformities, no chest tenderness, no pelvic pain.  Neurological: He is alert and oriented to person, place, and time.  Skin: Skin is warm.    ED Course  Procedures (including critical care time)  Labs Reviewed  CBC WITH DIFFERENTIAL - Abnormal; Notable for the following:    RBC 4.18 (*)     HCT 38.3 (*)     Neutrophils Relative 84 (*)     Lymphocytes Relative 6 (*)     Lymphs Abs 0.5 (*)     All other components within normal limits  BASIC METABOLIC PANEL - Abnormal; Notable for the following:    Sodium 125 (*)     Chloride 87 (*)     Glucose, Bld 120 (*)     GFR calc non Af Amer 86 (*)     All other components within normal limits  URINALYSIS, ROUTINE W REFLEX MICROSCOPIC - Abnormal; Notable for the following:    Ketones, ur  40 (*)     All other components within normal limits  TROPONIN I   Ct Head Wo Contrast  03/29/2012  *RADIOLOGY REPORT*  Clinical Data: Fall.  Headache.  Left periorbital bruising.   CT HEAD WITHOUT CONTRAST  Technique:  Contiguous axial images were obtained from the base of the skull through the vertex without contrast.  Comparison: None.  Findings: The brain stem, cerebellum, thalami, basal ganglia, ventricular system, and basilar cisterns appear unremarkable. Periventricular and corona radiata white matter hypodensities are most compatible with chronic ischemic microvascular white matter disease.  No intracranial hemorrhage, mass lesion, or acute infarction is identified.  Left periorbital hematoma noted with a fracture of the orbital roof and superior orbital rim extending into the left frontal sinus, with fluid in the left frontal sinus.  The fracture of the superior orbital rim appears to be segmental, with a slightly depressed fragment into the frontal sinus.  I suspect a nondisplaced fracture extension through the left frontal sinus into the anterior cranial fossa on the left, with a minuscule amount of gas tracking adjacent to the superior sagittal sinus/falx.  There is intraorbital hematoma on the left which appears to be primarily extraconal, as well as orbital gas.  Moreover, there is an air-fluid level in the left maxillary sinus which raises the possibility of an orbital floor fracture or maxillary fracture not included on today's exam. The visualized portion of the left zygomatic arch appears intact.  Atherosclerotic calcification of the carotid siphons noted.  IMPRESSION:  1.  Left frontal bone fracture extends through the frontal sinus and probably into the left anterior cranial fossa, with a segmental fracture of the left superior orbital rim mildly posteriorly displaced, with left superior orbital extraconal hematoma and gas. Equivocal pneumocephalus. 2.  Fluid in the left maxillary sinus raises the likelihood of the left inferior orbital or maxillary fracture which is not included on today's examination.  Dedicated maxillofacial CT is recommended for further characterization  of the craniofacial fractures. 3. Periventricular and corona radiata white matter hypodensities are most compatible with chronic ischemic microvascular white matter disease.   Original Report Authenticated By: Gaylyn Rong, M.D.      No diagnosis found.    MDM  Pt comes in after a syncopal episode.  DDx includes: Orthostatic hypotension Stroke Vertebral artery dissection/stenosis Dysrhythmia PE Vasovagal/neurocardiogenic syncope Aortic stenosis Valvular disorder/Cardiomyopathy Anemia  Post fall - pt has a large lac and ecchymoses. Will get Cardiac workup and CT head. Will need lav repair.  No prodrome - so possible dysrhythmia !?!? Will need admission.   Date: 03/29/2012  Rate: 80  Rhythm: normal sinus rhythm  QRS Axis: normal  Intervals: normal  ST/T Wave abnormalities: normal  Conduction Disutrbances:none  Narrative Interpretation:   Old EKG Reviewed: none available    Derwood Kaplan, MD 03/29/12 1630  Derwood Kaplan, MD 03/29/12 1631

## 2012-03-29 NOTE — ED Notes (Signed)
Pt states "I dunno what happen, I dunno if I fell, I can't remember", pt states he just realized he was bleeding from his head, states "I guess my glasses cut my face because they are broken". Pt states he has a lot of blood in his house from the laceration, pt has laceration to L forehead/above the eye area, bleeding controlled at this time, gauze over area. Pt also has bruising around L eye. A/o x 3 at this time. Pt also states has had nose bleeds throughout the day. Pt states use to get nose bleeds a long time ago but none recently until today.

## 2012-03-30 DIAGNOSIS — Z79899 Other long term (current) drug therapy: Secondary | ICD-10-CM | POA: Diagnosis not present

## 2012-03-30 DIAGNOSIS — R5381 Other malaise: Secondary | ICD-10-CM | POA: Diagnosis present

## 2012-03-30 DIAGNOSIS — G609 Hereditary and idiopathic neuropathy, unspecified: Secondary | ICD-10-CM | POA: Diagnosis present

## 2012-03-30 DIAGNOSIS — K219 Gastro-esophageal reflux disease without esophagitis: Secondary | ICD-10-CM | POA: Diagnosis present

## 2012-03-30 DIAGNOSIS — I6789 Other cerebrovascular disease: Secondary | ICD-10-CM | POA: Diagnosis present

## 2012-03-30 DIAGNOSIS — I951 Orthostatic hypotension: Secondary | ICD-10-CM | POA: Diagnosis not present

## 2012-03-30 DIAGNOSIS — E785 Hyperlipidemia, unspecified: Secondary | ICD-10-CM | POA: Diagnosis present

## 2012-03-30 DIAGNOSIS — S02109A Fracture of base of skull, unspecified side, initial encounter for closed fracture: Secondary | ICD-10-CM | POA: Diagnosis present

## 2012-03-30 DIAGNOSIS — E871 Hypo-osmolality and hyponatremia: Secondary | ICD-10-CM | POA: Diagnosis not present

## 2012-03-30 DIAGNOSIS — E876 Hypokalemia: Secondary | ICD-10-CM | POA: Diagnosis present

## 2012-03-30 DIAGNOSIS — I10 Essential (primary) hypertension: Secondary | ICD-10-CM | POA: Diagnosis present

## 2012-03-30 DIAGNOSIS — I359 Nonrheumatic aortic valve disorder, unspecified: Secondary | ICD-10-CM | POA: Diagnosis not present

## 2012-03-30 DIAGNOSIS — F329 Major depressive disorder, single episode, unspecified: Secondary | ICD-10-CM | POA: Diagnosis not present

## 2012-03-30 DIAGNOSIS — S0280XA Fracture of other specified skull and facial bones, unspecified side, initial encounter for closed fracture: Secondary | ICD-10-CM | POA: Diagnosis not present

## 2012-03-30 DIAGNOSIS — R55 Syncope and collapse: Secondary | ICD-10-CM | POA: Diagnosis not present

## 2012-03-30 DIAGNOSIS — S0100XA Unspecified open wound of scalp, initial encounter: Secondary | ICD-10-CM | POA: Diagnosis present

## 2012-03-30 DIAGNOSIS — M353 Polymyalgia rheumatica: Secondary | ICD-10-CM | POA: Diagnosis present

## 2012-03-30 LAB — BASIC METABOLIC PANEL
BUN: 8 mg/dL (ref 6–23)
CO2: 24 mEq/L (ref 19–32)
Calcium: 9 mg/dL (ref 8.4–10.5)
Chloride: 90 mEq/L — ABNORMAL LOW (ref 96–112)
Creatinine, Ser: 0.63 mg/dL (ref 0.50–1.35)

## 2012-03-30 LAB — CBC
HCT: 39.5 % (ref 39.0–52.0)
MCHC: 35.4 g/dL (ref 30.0–36.0)
MCV: 90 fL (ref 78.0–100.0)
Platelets: 165 10*3/uL (ref 150–400)
RDW: 12.7 % (ref 11.5–15.5)
WBC: 6.2 10*3/uL (ref 4.0–10.5)

## 2012-03-30 LAB — TROPONIN I
Troponin I: <0.3 ng/mL (ref <0.30)
Troponin I: <0.3 ng/mL (ref <0.30)

## 2012-03-30 LAB — LIPID PANEL
LDL Cholesterol: 116 mg/dL — ABNORMAL HIGH (ref 0–99)
Total CHOL/HDL Ratio: 2.3 RATIO
VLDL: 12 mg/dL (ref 0–40)

## 2012-03-30 LAB — TSH: TSH: 2.471 u[IU]/mL (ref 0.350–4.500)

## 2012-03-30 MED ORDER — FLUDROCORTISONE ACETATE 0.1 MG PO TABS
0.1000 mg | ORAL_TABLET | Freq: Every day | ORAL | Status: DC
  Administered 2012-03-30 – 2012-03-31 (×2): 0.1 mg via ORAL
  Filled 2012-03-30 (×2): qty 1

## 2012-03-30 MED ORDER — SODIUM CHLORIDE 0.9 % IV SOLN
INTRAVENOUS | Status: DC
  Administered 2012-03-30 (×2): via INTRAVENOUS

## 2012-03-30 NOTE — Care Management Note (Addendum)
    Page 1 of 2   03/31/2012     12:54:17 PM   CARE MANAGEMENT NOTE 03/31/2012  Patient:  Darin [name], Darin [name]   Account Number:  [id number]  Date Initiated:  03/30/2012  Documentation initiated by:  Lanier Clam  Subjective/Objective Assessment:   ADMITTED W/SYNCOPE FROM A FALL.HX:HTN.     Action/Plan:   FROM HOME ALONE.HAS PCP,PHARMACY.   Anticipated DC Date:  03/31/2012   Anticipated DC Plan:  HOME W HOME HEALTH SERVICES      DC Planning Services  CM consult      Choice offered to / List presented to:  C-1 Patient   DME arranged  Levan Hurst      DME agency  [facility]     HH arranged  HH-2 PT  HH-3 OT  HH-4 NURSE'S AIDE      HH agency  [facility]   Status of service:  Completed, signed off Medicare Important Message given?   (If response is "NO", the following Medicare IM given date fields will be blank) Date Medicare IM given:   Date Additional Medicare IM given:    Discharge Disposition:  HOME W HOME HEALTH SERVICES  Per UR Regulation:  Reviewed for med. necessity/level of care/duration of stay  If discussed at Long Length of Stay Meetings, dates discussed:    Comments:  03/31/12 [name] RN,BSN NCM [phone] [facility] SUSAN [facility] FOLLOWING FOR HHPT/DME [facility] FOLLOWING FOR RW.INFORMED FROM MD PATIENT WILL CANCEL FLIGHT TRIP TO [place],RECOMMENDED A NOTE FROM MD W/ADMIT,& D/C DATE FROM HOSPITAL ON A SCRIPT.  03/30/12 [name] RN,BSN NCM [phone] PT-HH,RW.PROVIDED [facility] AGENCY LIST.IF MD AGREE WILL NEED HH,DME ORDERS,& FACE TO FACE.

## 2012-03-30 NOTE — Progress Notes (Signed)
[facility] PROGRESS NOTE  Darin [name] OZH:[id number] DOB: 09-06-1931 DOA: 03/29/2012 PCP: Carrie Mew, MD  Assessment/Plan: 1-Syncope and collapse: Differential diagnosis including arrhythmias, orthostatic hypotension, vasovagal/neurocardiogenic syncope, carotid artery disease. In ED patient was found to be hyponatremic with a sodium of 125 and also hypochloremic. His potassium was within normal limits. Concerns for adrenal disease as part of definitive diagnosis.  -No abnormalities appreciated on telemetry so far. -TSH, B12, cortisol level and cardiac enzymes within normal limits. UDS is positive Just to opiates received in the emergency department and alcohol level <11.  -CT head in the ED without acute abnormalities and patient neurologic exam within normal limits making less likely to be secondary to CVA. Will hold on MRI at this moment.  -Carotid Dopplers and 2-D echo essentially normal.  -No signs or source of infection appreciated workup.  -Patient was orthostatic this morning and felt lightheaded when changing position from laying down to standing position.  -Will continue fluid resuscitation, continue holding antihypertensive drugs, replete electrolytes, he will most likely require Florinef by mouth at discharge. Workup altered now consistently with orthostasis.   2-HYPERLIPIDEMIA: Lipid panel consistent with mild elevation of LDL. Continue fish oil. Patient unable to take statins  3-HYPERTENSION: Will continue holding antihypertensive medication and provide fluid resuscitation for now.   4-GERD: Continue PPI   5-Hyponatremia: As mentioned above. Cortisol level and TSH within normal limits. Will continue holding HCTZ, continue IV fluids and will start Florinef.   6-Depression: Stable. Patient was using saw palmetto as an outpatient. Medication will be placed on hold while in the hospital.   7-Fracture, orbital: Maxillofacial CT demonstrated orbital fracture with  emphysema and also hematoma findings. There is no Vision changes. Will consult opinion for any further treatment to Dr. Pollyann Kennedy Facial trauma specialist.  DVT prophylaxis: SCDs.  Code Status: Full Family Communication: no family at bedside Disposition Plan: home with HHPT and DME at discharge. Trauma service to evaluate for any needs on his orbital fracture; probably home tomorrow.   Consultants:  Facial trauma service (Dr. Pollyann Kennedy)  Procedures:  CT head (see below for report)  Maxillofacial CT (See below for report)  2-D echo (no wall motion abnormalities, just mild mitral regurgitation, preserved EF 60%)  Carotid dopplers (vertebrals are patent and with antegrade flow; no significant extracranial carotid stenosis)  Antibiotics: None  HPI/Subjective: Afebrile, no chest pain, no shortness of breath. Patient reports when changing position feeling a little bit lightheaded but no syncope, no vertigo.   Objective: Filed Vitals:   03/30/12 0622 03/30/12 0627 03/30/12 0629 03/30/12 1310  BP: 151/64 178/71 157/61 156/60  Pulse: 85 96 82 84  Temp: 99.3 F (37.4 C)   98.6 F (37 C)  TempSrc: Oral   Oral  Resp: 20   16  Height:      Weight:      SpO2: 95% 94% 96% 97%    Intake/Output Summary (Last 24 hours) at 03/30/12 1822 Last data filed at 03/30/12 1500  Gross per 24 hour  Intake 1003.33 ml  Output   2626 ml  Net -1622.67 ml   Filed Weights   03/29/12 1948  Weight: 92.352 kg (203 lb 9.6 oz)    Exam:   General:  NAD, afebrile; feeling better  Cardiovascular: RRR, no rubs, no murmurs, no gallops  Respiratory: CTA  Abdomen: soft, NT, ND; positive BS  Neuro: non focal.  Data Reviewed: Basic Metabolic Panel:  Lab 03/30/12 6295 03/29/12 2035 03/29/12 1434  NA 125* -- 125*  K 4.3 -- 3.7  CL 90* -- 87*  CO2 24 -- 28  GLUCOSE 109* -- 120*  BUN 8 -- 16  CREATININE 0.63 -- 0.74  CALCIUM 9.0 -- 9.5  MG -- 1.7 --  PHOS -- 2.0* --   Liver Function  Tests:  Lab 03/29/12 2035  AST 22  ALT 16  ALKPHOS 44  BILITOT 0.3  PROT 5.9*  ALBUMIN 2.9*   CBC:  Lab 03/30/12 0215 03/29/12 1434  WBC 6.2 8.0  NEUTROABS -- 6.7  HGB 14.0 13.4  HCT 39.5 38.3*  MCV 90.0 91.6  PLT 165 163   Cardiac Enzymes:  Lab 03/30/12 0810 03/30/12 0215 03/29/12 2035 03/29/12 1434  CKTOTAL -- -- -- --  CKMB -- -- -- --  CKMBINDEX -- -- -- --  TROPONINI <0.30 <0.30 <0.30 <0.30     Studies: Ct Head Wo Contrast  03/29/2012  *RADIOLOGY REPORT*  Clinical Data: Fall.  Headache.  Left periorbital bruising.  CT HEAD WITHOUT CONTRAST  Technique:  Contiguous axial images were obtained from the base of the skull through the vertex without contrast.  Comparison: None.  Findings: The brain stem, cerebellum, thalami, basal ganglia, ventricular system, and basilar cisterns appear unremarkable. Periventricular and corona radiata white matter hypodensities are most compatible with chronic ischemic microvascular white matter disease.  No intracranial hemorrhage, mass lesion, or acute infarction is identified.  Left periorbital hematoma noted with a fracture of the orbital roof and superior orbital rim extending into the left frontal sinus, with fluid in the left frontal sinus.  The fracture of the superior orbital rim appears to be segmental, with a slightly depressed fragment into the frontal sinus.  I suspect a nondisplaced fracture extension through the left frontal sinus into the anterior cranial fossa on the left, with a minuscule amount of gas tracking adjacent to the superior sagittal sinus/falx.  There is intraorbital hematoma on the left which appears to be primarily extraconal, as well as orbital gas.  Moreover, there is an air-fluid level in the left maxillary sinus which raises the possibility of an orbital floor fracture or maxillary fracture not included on today's exam. The visualized portion of the left zygomatic arch appears intact.  Atherosclerotic calcification of  the carotid siphons noted.  IMPRESSION:  1.  Left frontal bone fracture extends through the frontal sinus and probably into the left anterior cranial fossa, with a segmental fracture of the left superior orbital rim mildly posteriorly displaced, with left superior orbital extraconal hematoma and gas. Equivocal pneumocephalus. 2.  Fluid in the left maxillary sinus raises the likelihood of the left inferior orbital or maxillary fracture which is not included on today's examination.  Dedicated maxillofacial CT is recommended for further characterization of the craniofacial fractures. 3. Periventricular and corona radiata white matter hypodensities are most compatible with chronic ischemic microvascular white matter disease.   Original Report Authenticated By: Gaylyn Rong, M.D.    Ct Maxillofacial Wo Cm  03/29/2012  *RADIOLOGY REPORT*  Clinical Data: Larey Seat, left frontal sinus fracture, evaluate maxillofacial region.  CT MAXILLOFACIAL WITHOUT CONTRAST  Technique:  Multidetector CT imaging of the maxillofacial structures was performed. Multiplanar CT image reconstructions were also generated.  Comparison: CT head earlier in the day.  Findings: There is a air-fluid level in the left maxillary sinus but the bony confines of the sinus appear intact.  This may be layering blood from the frontoethmoid region. There is no blowout injury.  There is a comminuted superior orbital rim fracture on  the left which involves the anterior wall of the frontal sinus or milk the semen is present.  Significant blood layers in the left frontal sinus and anterior left ethmoid air cells.  There is an extraconal hematoma superiorly which slightly displaces the globe anteriorly. The majority of the anterior orbital hematoma is preseptal in location.  There is no medial blowout fracture observed. The globe appears intact.  Right cataract has been removed.  There is no post- septal hematoma.  The zygoma is intact.  TMJs are located.  No  nasal bone fracture. No visible intracranial extra-axial fluid collection.  IMPRESSION: Air-fluid level left maxillary sinus is not associated with blowout injury.  Comminuted superior orbital rim fracture on the left also involves the frontal sinus; there is extraconal hematoma, preseptal hematoma, proptosis, and orbital emphysema.   Original Report Authenticated By: Davonna Belling, M.D.     Scheduled Meds:   . aspirin EC  81 mg Oral QHS  . famotidine  40 mg Oral QHS  . folic acid-pyridoxine-cyancobalamin  1 tablet Oral Daily  . magnesium oxide  400 mg Oral Daily  . niacin  500 mg Oral BID WC  . omega-3 acid ethyl esters  2 g Oral BID  . polyethylene glycol  17 g Oral Daily  . sodium chloride  3 mL Intravenous Q12H  . vitamin C  500 mg Oral Daily   Continuous Infusions:   . sodium chloride      Time spent: >30 minutes    Kasidee Voisin  [facility] Pager [phone]. If 8PM-8AM, please contact night-coverage at [email or web address], password [facility] 03/30/2012, 6:22 PM  LOS: 1 day

## 2012-03-30 NOTE — Evaluation (Signed)
Physical Therapy Evaluation Patient Details Name: Darin [name] MRN: [id number] DOB: 1932-01-01 Today's Date: 03/30/2012 Time: 1040-1106 PT Time Calculation (min): 26 min  PT Assessment / Plan / Recommendation Clinical Impression  79 yo male admitted with syncopal episode and collapse. Per chart, pt appears to be orthostatic when BP has been taken in various positions. No complaints of dizziness during evaluation. Ambulated ~135 feet with RW with Min-guard assist. Recommend ambulation with nursing assistance during stay to increase mobility. Recommend HHPT, RW, and intermittent supervision initially. May benefit from home health aide as well, if possible.     PT Assessment  Patient needs continued PT services    Follow Up Recommendations  Home health PT;Supervision - Intermittent (Home Health Aide if possible)    Does the patient have the potential to tolerate intense rehabilitation      Barriers to Discharge        Equipment Recommendations  Rolling walker with 5" wheels    Recommendations for Other Services OT consult   Frequency Min 3X/week    Precautions / Restrictions Precautions Precautions: Fall Restrictions Weight Bearing Restrictions: No   Pertinent Vitals/Pain Headache-unrated-RN notified      Mobility  Bed Mobility Bed Mobility: Supine to Sit Supine to Sit: 6: Modified independent (Device/Increase time);HOB elevated;With rails Transfers Transfers: Sit to Stand;Stand to Sit Sit to Stand: From bed;5: Supervision Stand to Sit: To chair/3-in-1;5: Supervision Details for Transfer Assistance: VCs safety, hand placement.  Ambulation/Gait Ambulation/Gait Assistance: 4: Min guard Ambulation Distance (Feet): 135 Feet Assistive device: Rolling walker Ambulation/Gait Assistance Details: VCs safety, technique. Slow gait speed. Unsteady at times with RW. Pt too unsteady to attempt ambulation without assistive device at this time.  Gait Pattern: Decreased stride  length;Step-through pattern    Shoulder Instructions     Exercises     PT Diagnosis: Difficulty walking;Abnormality of gait  PT Problem List: Decreased mobility;Decreased balance;Pain;Decreased knowledge of use of DME PT Treatment Interventions: DME instruction;Gait training;Functional mobility training;Therapeutic activities;Therapeutic exercise;Patient/family education   PT Goals Acute Rehab PT Goals PT Goal Formulation: With patient Time For Goal Achievement: 04/13/12 Potential to Achieve Goals: Good Pt will go Sit to Stand: with modified independence PT Goal: Sit to Stand - Progress: Progressing toward goal Pt will Ambulate: >150 feet;with modified independence;with least restrictive assistive device PT Goal: Ambulate - Progress: Goal set today Additional Goals Additional Goal #1: Pt will demonstrate good balance with functional activities, without LOB or cues for safety.  PT Goal: Additional Goal #1 - Progress: Goal set today  Visit Information  Last PT Received On: 03/30/12 Assistance Needed: +1    Subjective Data  Subjective: "Its happened before but not to this extent" Patient Stated Goal: Home. Vacation to [place]   Prior Functioning  Home Living Lives With: Alone Type of Home: House Home Access: Stairs to enter (back 0) Entrance Stairs-Number of Steps: back-1 small step. can also use garage Home Layout: One level Bathroom Shower/Tub: Banker: None Prior Function Level of Independence: Independent Able to Take Stairs?: Yes Driving: Yes Communication Communication: No difficulties    Cognition  Overall Cognitive Status: Appears within functional limits for tasks assessed/performed Arousal/Alertness: Awake/alert Orientation Level: Appears intact for tasks assessed Behavior During Session: [facility] for tasks performed    Extremity/Trunk Assessment Right Lower Extremity Assessment RLE ROM/Strength/Tone: [facility] for tasks  assessed Left Lower Extremity Assessment LLE ROM/Strength/Tone: [facility] for tasks assessed Trunk Assessment Trunk Assessment: Normal   Balance Balance Balance Assessed: Yes Static Standing Balance  Static Standing - Balance Support: No upper extremity supported Static Standing - Level of Assistance: 4: Min assist Static Standing - Comment/# of Minutes: Static standing at EOB for ~ before proceeding with walk. 1 instance of LOB posteriorly. Pt reaching out for something to steady himself with.   End of Session PT - End of Session Equipment Utilized During Treatment: Gait belt Activity Tolerance: Patient tolerated treatment well Patient left: in chair;with call bell/phone within reach  GP Functional Assessment Tool Used: clinical judgement Functional Limitation: Mobility: Walking and moving around Mobility: Walking and Moving Around Current Status ([id number]): At least 1 percent but less than 20 percent impaired, limited or restricted Mobility: Walking and Moving Around Goal Status [phone]): 0 percent impaired, limited or restricted   Rebeca Alert [facility] 03/30/2012, 11:17 AM [phone]

## 2012-03-30 NOTE — Progress Notes (Signed)
Bilateral:  No evidence of hemodynamically significant internal carotid artery stenosis.   Vertebral artery flow is antegrade.     

## 2012-03-30 NOTE — Progress Notes (Signed)
  Echocardiogram 2D Echocardiogram has been performed.  Cathie Beams 03/30/2012, 9:57 AM

## 2012-03-31 ENCOUNTER — Telehealth: Payer: Self-pay | Admitting: Internal Medicine

## 2012-03-31 DIAGNOSIS — S0280XA Fracture of other specified skull and facial bones, unspecified side, initial encounter for closed fracture: Secondary | ICD-10-CM | POA: Diagnosis not present

## 2012-03-31 DIAGNOSIS — E871 Hypo-osmolality and hyponatremia: Secondary | ICD-10-CM | POA: Diagnosis not present

## 2012-03-31 DIAGNOSIS — K219 Gastro-esophageal reflux disease without esophagitis: Secondary | ICD-10-CM | POA: Diagnosis not present

## 2012-03-31 DIAGNOSIS — I951 Orthostatic hypotension: Secondary | ICD-10-CM | POA: Diagnosis not present

## 2012-03-31 DIAGNOSIS — S02109A Fracture of base of skull, unspecified side, initial encounter for closed fracture: Secondary | ICD-10-CM | POA: Diagnosis not present

## 2012-03-31 MED ORDER — TRIPLE ANTIBIOTIC 5-400-5000 EX OINT: TOPICAL_OINTMENT | Freq: Two times a day (BID) | CUTANEOUS | Status: DC

## 2012-03-31 MED ORDER — FLUDROCORTISONE ACETATE 0.1 MG PO TABS: 0.1000 mg | ORAL_TABLET | Freq: Every day | ORAL | Status: DC

## 2012-03-31 MED ORDER — BENAZEPRIL HCL 10 MG PO TABS: 10.0000 mg | ORAL_TABLET | Freq: Every day | ORAL | Status: DC

## 2012-03-31 NOTE — Discharge Summary (Signed)
Physician Discharge Summary  Darin [name] ZOX:[id number] DOB: 1931/07/08 DOA: 03/29/2012  PCP: Carrie Mew, MD  Admit date: 03/29/2012 Discharge date: 03/31/2012  Time spent: >30 minutes  Recommendations for Outpatient Follow-up:  1. BMET to follow electrolytes 2. Reevaluate BP and adjust antihypertensive agents 3. Patient will follow with cardiology for outpatient holter event monitoring; follow results and recommendations.  Discharge Diagnoses:  Active Problems:  HYPERLIPIDEMIA  HYPERTENSION  GERD  Syncope and collapse  Hyponatremia  Depression  Fracture, orbital   Discharge Condition: stable and improved. Will go home with [facility] services. Follow up with PCP in 1-2 weeks.  Diet recommendation: heart healthy diet  Filed Weights   03/29/12 1948  Weight: 92.352 kg (203 lb 9.6 oz)    History of present illness:  79 y.o. male with a past medical history significant for hypertension, hyperlipidemia, gastroesophageal reflux disease, PMR and arthritis; came to the hospital after experiencing an episode of syncope and collapse. Patient states that he was trying to go to the bathroom and he just passed out. There was no prodrome symptoms associated or precipitating the syncope event (no chest pain, palpitations, diaphoresis, shortness of breath, dizziness, blurred vision, vertigo or lightheadedness). Patient reports that he has had approximately a month ago while he was in [place] another episode of syncope no evaluation was done at that time. He denies any new medications and also changes in his lifestyle recently. Reports that he has been eating and drinking properly. As a consequence of that syncope event patient has laceration on his left scalp and also periorbital hematoma.  In the ED patient was found to be hyponatremic, hypokalemic, no signs of infection A. his urinalysis, negative troponin and no acute changes on his EKG; CT of the head demonstrated chronic ischemic microvascular  white matter disease but no acute stroke; that is her left frontal bone fracture that extends through the frontal sinus on probably into the left anterior cranial fossa. A maxillofacial CT has been order and is pending at this moment for further evaluation of this orbital fracture.    Hospital Course:  1-Syncope and collapse: Differential diagnosis including arrhythmias, orthostatic hypotension, vasovagal/neurocardiogenic syncope, carotid artery disease.  -No abnormalities appreciated on telemetry.  -TSH, B12, cortisol level and cardiac enzymes within normal limits. UDS is positive Just to opiates received in the emergency department and alcohol level <11.  -CT head in the ED without acute abnormalities and patient neurologic exam within normal limits making less likely to be secondary to CVA.  -Carotid Dopplers and 2-D echo essentially normal.  -No signs or source of infection appreciated on workup.  -Patient found orthostatic especially when changing positions (laying down/sitting to standing).  -Started on florinef and symptoms resolved. -will follow with PCP for further evaluation and treatment and also with cardiology for Holter event monitoring.  2-HYPERLIPIDEMIA: Lipid panel consistent with mild elevation of LDL. Continue fish oil. Patient unable to take statins   3-HYPERTENSION: Will discontinue HCTZ and amlodipine; patient discharge on benazepril only. further medication adjustment to be done by PCP as an outpatient.continue holding antihypertensive medication and provide fluid resuscitation for now.   4-GERD: Continue PPI   5-Hyponatremia: As mentioned above. Cortisol level and TSH within normal limits. Will discontinue holding HCTZ, and started on Florinef. BMET to be followed as an outpatient by PCP.  6-Depression and BPH: Stable. Patient was using saw palmetto as an outpatient and will be resumed.  7-Left Orbital Fracture and skin laceration: Maxillofacial CT demonstrated orbital  fracture with  emphysema and also hematoma findings. There is no Vision abnormalities. Per Dr. Pollyann Kennedy (facial trauma service) will start topical antibiotics for laceration, removed stiches in about 7 days and pain management for conservative treatment. No surgery needed.  8-Deconditioning and ataxia: Patient seen by PT/OT and at this point recommendations are for HHPT/HHOT and the use of a rolling walker.  Procedures: -CT head (see below for report)  -Maxillofacial CT (See below for report)  -2-D echo (no wall motion abnormalities, just mild mitral regurgitation, preserved EF 60%)  -Carotid dopplers (vertebrals are patent and with antegrade flow; no significant extracranial carotid stenosis)  Consultations:  Maxillofacial trauma (Dr. Pollyann Kennedy)  PT/OT  Cardiology (called to arrange outpatient Holter event monitoring)  Discharge Exam: Filed Vitals:   03/30/12 2144 03/31/12 0513 03/31/12 0514 03/31/12 0516  BP: 155/65 153/65 152/77 138/69  Pulse: 85 72 75 80  Temp: 98.5 F (36.9 C) 98.1 F (36.7 C)    TempSrc: Oral Oral    Resp: 16 16    Height:      Weight:      SpO2: 97% 98%      General: NAD, afebrile, no orthostatic abnormalities appreciated on today's exam Cardiovascular: RRR, no rubs or gallops Respiratory: CTA Neuro: non focal  Discharge Instructions  Discharge Orders    Future Appointments: Provider: Department: Dept Phone: Center:   06/15/2012 10:00 AM Stacie Glaze, MD [facility] [phone] [facility]     Future Orders Please Complete By Expires   Increase activity slowly      Discharge instructions      Comments:   Keep yourself well hydrated Take medications as prescribed Arrange follow up with PCP in 7-10 days Follow a heart healthy diet [facility] office will contact you with appointment details for holter event monitoring procedure.       Medication List     As of 03/31/2012 12:27 PM    STOP taking these medications           atenolol 50 MG tablet   Commonly known as: TENORMIN      benazepril-hydrochlorthiazide 10-12.5 MG per tablet   Commonly known as: LOTENSIN HCT      TAKE these medications         aspirin 81 MG tablet   Take 81 mg by mouth at bedtime.      benazepril 10 MG tablet   Commonly known as: LOTENSIN   Take 1 tablet (10 mg total) by mouth daily.      CALCIUM 500 +D 500-400 MG-UNIT Tabs   Generic drug: Calcium Carb-Cholecalciferol   Take by mouth 2 (two) times daily.      famotidine 20 MG tablet   Commonly known as: PEPCID   Take 20 mg by mouth at bedtime.      fish oil-omega-3 fatty acids 1000 MG capsule   Take 2 g by mouth 2 (two) times daily.      fludrocortisone 0.1 MG tablet   Commonly known as: FLORINEF   Take 1 tablet (0.1 mg total) by mouth daily.      Folic Acid-Vit B6-Vit B12 2.5-25-1 MG Tabs   Commonly known as: FOLBEE   Take 1 tablet by mouth daily.      LEVITRA 20 MG tablet   Generic drug: vardenafil   TAKE 1 TABLET DAILY AS NEEDED.      magnesium oxide 400 MG tablet   Commonly known as: MAG-OX   Take 1 tablet (400 mg total) by mouth  daily.      meloxicam 15 MG tablet   Commonly known as: MOBIC   Take 1 tablet (15 mg total) by mouth daily.      neomycin-bacitracin-polymyxin [phone] ointment   Apply topically 2 (two) times daily.      niacin 500 MG tablet   Take 500 mg by mouth 2 (two) times daily with a meal.      polyethylene glycol packet   Commonly known as: MIRALAX / GLYCOLAX   Take 17 g by mouth 2 (two) times daily.      saw palmetto 80 MG capsule   Take 80 mg by mouth daily.      vitamin C 500 MG tablet   Commonly known as: ASCORBIC ACID   Take 500 mg by mouth daily.           Follow-up Information    Follow up with Serena Colonel, MD. Today. (As needed)    Contact information:   [address], [address] [place] [address] [phone]       Follow up with Carrie Mew, MD.  Schedule an appointment as soon as possible for a visit in 10 days.   Contact information:   [address]ena Flake [place] [address] [phone]       Follow up with [facility]). (office will call you with appointment details for event monitoring.)    Contact information:   [address], [address] [place] [address] [phone]          The results of significant diagnostics from this hospitalization (including imaging, microbiology, ancillary and laboratory) are listed below for reference.    Significant Diagnostic Studies: Ct Head Wo Contrast  03/29/2012  *RADIOLOGY REPORT*  Clinical Data: Fall.  Headache.  Left periorbital bruising.  CT HEAD WITHOUT CONTRAST  Technique:  Contiguous axial images were obtained from the base of the skull through the vertex without contrast.  Comparison: None.  Findings: The brain stem, cerebellum, thalami, basal ganglia, ventricular system, and basilar cisterns appear unremarkable. Periventricular and corona radiata white matter hypodensities are most compatible with chronic ischemic microvascular white matter disease.  No intracranial hemorrhage, mass lesion, or acute infarction is identified.  Left periorbital hematoma noted with a fracture of the orbital roof and superior orbital rim extending into the left frontal sinus, with fluid in the left frontal sinus.  The fracture of the superior orbital rim appears to be segmental, with a slightly depressed fragment into the frontal sinus.  I suspect a nondisplaced fracture extension through the left frontal sinus into the anterior cranial fossa on the left, with a minuscule amount of gas tracking adjacent to the superior sagittal sinus/falx.  There is intraorbital hematoma on the left which appears to be primarily extraconal, as well as orbital gas.  Moreover, there is an air-fluid level in the left maxillary sinus which raises the possibility of an orbital floor  fracture or maxillary fracture not included on today's exam. The visualized portion of the left zygomatic arch appears intact.  Atherosclerotic calcification of the carotid siphons noted.  IMPRESSION:  1.  Left frontal bone fracture extends through the frontal sinus and probably into the left anterior cranial fossa, with a segmental fracture of the left superior orbital rim mildly posteriorly displaced, with left superior orbital extraconal hematoma and gas. Equivocal pneumocephalus. 2.  Fluid in the left maxillary sinus raises the likelihood of the left inferior orbital  or maxillary fracture which is not included on today's examination.  Dedicated maxillofacial CT is recommended for further characterization of the craniofacial fractures. 3. Periventricular and corona radiata white matter hypodensities are most compatible with chronic ischemic microvascular white matter disease.   Original Report Authenticated By: Gaylyn Rong, M.D.    Ct Maxillofacial Wo Cm  03/29/2012  *RADIOLOGY REPORT*  Clinical Data: Larey Seat, left frontal sinus fracture, evaluate maxillofacial region.  CT MAXILLOFACIAL WITHOUT CONTRAST  Technique:  Multidetector CT imaging of the maxillofacial structures was performed. Multiplanar CT image reconstructions were also generated.  Comparison: CT head earlier in the day.  Findings: There is a air-fluid level in the left maxillary sinus but the bony confines of the sinus appear intact.  This may be layering blood from the frontoethmoid region. There is no blowout injury.  There is a comminuted superior orbital rim fracture on the left which involves the anterior wall of the frontal sinus or milk the semen is present.  Significant blood layers in the left frontal sinus and anterior left ethmoid air cells.  There is an extraconal hematoma superiorly which slightly displaces the globe anteriorly. The majority of the anterior orbital hematoma is preseptal in location.  There is no medial blowout  fracture observed. The globe appears intact.  Right cataract has been removed.  There is no post- septal hematoma.  The zygoma is intact.  TMJs are located.  No nasal bone fracture. No visible intracranial extra-axial fluid collection.  IMPRESSION: Air-fluid level left maxillary sinus is not associated with blowout injury.  Comminuted superior orbital rim fracture on the left also involves the frontal sinus; there is extraconal hematoma, preseptal hematoma, proptosis, and orbital emphysema.   Original Report Authenticated By: Davonna Belling, M.D.     Labs: Basic Metabolic Panel:  Lab 03/30/12 5621 03/29/12 2035 03/29/12 1434  NA 125* -- 125*  K 4.3 -- 3.7  CL 90* -- 87*  CO2 24 -- 28  GLUCOSE 109* -- 120*  BUN 8 -- 16  CREATININE 0.63 -- 0.74  CALCIUM 9.0 -- 9.5  MG -- 1.7 --  PHOS -- 2.0* --   Liver Function Tests:  Lab 03/29/12 2035  AST 22  ALT 16  ALKPHOS 44  BILITOT 0.3  PROT 5.9*  ALBUMIN 2.9*   CBC:  Lab 03/30/12 0215 03/29/12 1434  WBC 6.2 8.0  NEUTROABS -- 6.7  HGB 14.0 13.4  HCT 39.5 38.3*  MCV 90.0 91.6  PLT 165 163   Cardiac Enzymes:  Lab 03/30/12 0810 03/30/12 0215 03/29/12 2035 03/29/12 1434  CKTOTAL -- -- -- --  CKMB -- -- -- --  CKMBINDEX -- -- -- --  TROPONINI <0.30 <0.30 <0.30 <0.30    Signed:  Davan Hark  [facility] 03/31/2012, 12:27 PM

## 2012-03-31 NOTE — ED Provider Notes (Signed)
Medical screening examination/treatment/procedure(s) were conducted as a shared visit with non-physician practitioner(s) and myself.  I personally evaluated the patient during the encounter  Derwood Kaplan, MD 03/31/12 [phone]

## 2012-03-31 NOTE — Progress Notes (Signed)
Physical Therapy Treatment Patient Details Name: Darin [name] MRN: [id number] DOB: 05-Mar-1932 Today's Date: 03/31/2012 Time: 9562-1308 PT Time Calculation (min): 27 min  PT Assessment / Plan / Recommendation Comments on Treatment Session  Pt. presents with decreased steadiness for gait and does best with RW. Concern is for pt to have to perform all of his ADL's, meals, and carry objects with noted imbalance. Will test balance for objective  fall risk. Pt declined to consider REHAB.    Follow Up Recommendations  Home health PT;Supervision/Assistance - 24 hour     Does the patient have the potential to tolerate intense rehabilitation     Barriers to Discharge        Equipment Recommendations  Rolling walker with 5" wheels    Recommendations for Other Services    Frequency Min 3X/week   Plan Discharge plan remains appropriate;Frequency remains appropriate    Precautions / Restrictions Precautions Precautions: Fall   Pertinent Vitals/Pain     Mobility  Bed Mobility Supine to Sit: 7: Independent Transfers Sit to Stand: 5: Supervision;From bed Stand to Sit: To chair/3-in-1;5: Supervision Ambulation/Gait Ambulation/Gait Assistance Details: VCs safety, hand placement Gait Pattern: Decreased stride length;Decreased step length - right;Decreased stance time - right Gait velocity: decreased, slower at times as he regains his balance and momentum General Gait Details: pt. demonstrates decreased balance responses, decreased R foot flat at stance, noted tremors of legs. Pt used RW with slightly improved balance. Will perform Berg balance for standardized test for fall risk. Pt. reports he has no help.    Exercises     PT Diagnosis:    PT Problem List:   PT Treatment Interventions:     PT Goals    Visit Information  Last PT Received On: 03/31/12 Assistance Needed: +1    Subjective Data  Subjective: you will have a hard time convincing me to go to rehab.   Cognition  Overall Cognitive Status: Appears within functional limits for tasks assessed/performed Arousal/Alertness: Awake/alert Orientation Level: Appears intact for tasks assessed Behavior During Session: [facility] for tasks performed    Balance     End of Session PT - End of Session Equipment Utilized During Treatment: Gait belt Activity Tolerance: Patient tolerated treatment well Patient left: in chair;with call bell/phone within reach Nurse Communication: Mobility status   GP     Rada Hay 03/31/2012, 9:58 AM

## 2012-03-31 NOTE — Consult Note (Signed)
Reason for Consult:Facial fractures Referring Physician: Vassie Loll, MD  Darin [name] is an 79 y.o. male.  HPI: He fell from a syncopal episode Sunday. Had a minor fall about a month or so ago. He had the left side of the forehead sutured in the ED. He offers no visual complaints, and has no noticeable hypesthesia.   Past Medical History  Diagnosis Date  . GERD (gastroesophageal reflux disease)   . Hyperlipidemia   . Hypertension   . Arthritis   . PMR (polymyalgia rheumatica)   . Knee pain   . Osteoporosis   . H/O hiatal hernia     "FOR YEARS" " NO PROBLEM"  . Neuropathy     PERIPHERAL   . Polymyalgia 5 YEARS    Past Surgical History  Procedure Date  . Tonsillectomy   . Carpal tunnel release   . Varicose vein surgery   . Cataract extraction     right  . Inguinal hernia repair 10/14/2011    Procedure: HERNIA REPAIR INGUINAL ADULT;  Surgeon: Shelly Rubenstein, MD;  Location: [facility] OR;  Service: General;  Laterality: Right;  Right Inguinal Hernia Repair with Mesh    Family History  Problem Relation Age of Onset  . Coronary artery disease    . Heart disease Father     Social History:  reports that he has never smoked. He has never used smokeless tobacco. He reports that he drinks alcohol. He reports that he does not use illicit drugs.  Allergies:  Allergies  Allergen Reactions  . Statins Other (See Comments)    Muscle pain and upset stomach    Medications: Reviewed  Results for orders placed during the hospital encounter of 03/29/12 (from the past 48 hour(s))  CBC WITH DIFFERENTIAL     Status: Abnormal   Collection Time   03/29/12  2:34 PM      Component Value Range Comment   WBC 8.0  4.0 - 10.5 K/uL    RBC 4.18 (*) 4.22 - 5.81 MIL/uL    Hemoglobin 13.4  13.0 - 17.0 g/dL    HCT 82.9 (*) 56.2 - 52.0 %    MCV 91.6  78.0 - 100.0 fL    MCH 32.1  26.0 - 34.0 pg    MCHC 35.0  30.0 - 36.0 g/dL    RDW 13.0  86.5 - 78.4 %    Platelets 163  150 - 400 K/uL    Neutrophils Relative 84 (*) 43 - 77 %    Neutro Abs 6.7  1.7 - 7.7 K/uL    Lymphocytes Relative 6 (*) 12 - 46 %    Lymphs Abs 0.5 (*) 0.7 - 4.0 K/uL    Monocytes Relative 10  3 - 12 %    Monocytes Absolute 0.8  0.1 - 1.0 K/uL    Eosinophils Relative 0  0 - 5 %    Eosinophils Absolute 0.0  0.0 - 0.7 K/uL    Basophils Relative 0  0 - 1 %    Basophils Absolute 0.0  0.0 - 0.1 K/uL   BASIC METABOLIC PANEL     Status: Abnormal   Collection Time   03/29/12  2:34 PM      Component Value Range Comment   Sodium 125 (*) 135 - 145 mEq/L    Potassium 3.7  3.5 - 5.1 mEq/L    Chloride 87 (*) 96 - 112 mEq/L    CO2 28  19 - 32 mEq/L    Glucose, Bld  120 (*) 70 - 99 mg/dL    BUN 16  6 - 23 mg/dL    Creatinine, Ser 3.08  0.50 - 1.35 mg/dL    Calcium 9.5  8.4 - 65.7 mg/dL    GFR calc non Af Amer 86 (*) >90 mL/min    GFR calc Af Amer >90  >90 mL/min   TROPONIN I     Status: Normal   Collection Time   03/29/12  2:34 PM      Component Value Range Comment   Troponin I <0.30  <0.30 ng/mL   URINALYSIS, ROUTINE W REFLEX MICROSCOPIC     Status: Abnormal   Collection Time   03/29/12  2:58 PM      Component Value Range Comment   Color, Urine YELLOW  YELLOW    APPearance CLEAR  CLEAR    Specific Gravity, Urine 1.019  1.005 - 1.030    pH 6.0  5.0 - 8.0    Glucose, UA NEGATIVE  NEGATIVE mg/dL    Hgb urine dipstick NEGATIVE  NEGATIVE    Bilirubin Urine NEGATIVE  NEGATIVE    Ketones, ur 40 (*) NEGATIVE mg/dL    Protein, ur NEGATIVE  NEGATIVE mg/dL    Urobilinogen, UA 0.2  0.0 - 1.0 mg/dL    Nitrite NEGATIVE  NEGATIVE    Leukocytes, UA NEGATIVE  NEGATIVE MICROSCOPIC NOT DONE ON URINES WITH NEGATIVE PROTEIN, BLOOD, LEUKOCYTES, NITRITE, OR GLUCOSE <1000 mg/dL.  URINE RAPID DRUG SCREEN (HOSP PERFORMED)     Status: Abnormal   Collection Time   03/29/12  2:58 PM      Component Value Range Comment   Opiates POSITIVE (*) NONE DETECTED    Cocaine NONE DETECTED  NONE DETECTED    Benzodiazepines NONE DETECTED  NONE  DETECTED    Amphetamines NONE DETECTED  NONE DETECTED    Tetrahydrocannabinol NONE DETECTED  NONE DETECTED    Barbiturates NONE DETECTED  NONE DETECTED   ETHANOL     Status: Normal   Collection Time   03/29/12  5:08 PM      Component Value Range Comment   Alcohol, Ethyl (B) <11  0 - 11 mg/dL   PHOSPHORUS     Status: Abnormal   Collection Time   03/29/12  8:35 PM      Component Value Range Comment   Phosphorus 2.0 (*) 2.3 - 4.6 mg/dL   MAGNESIUM     Status: Normal   Collection Time   03/29/12  8:35 PM      Component Value Range Comment   Magnesium 1.7  1.5 - 2.5 mg/dL   HEPATIC FUNCTION PANEL     Status: Abnormal   Collection Time   03/29/12  8:35 PM      Component Value Range Comment   Total Protein 5.9 (*) 6.0 - 8.3 g/dL    Albumin 2.9 (*) 3.5 - 5.2 g/dL    AST 22  0 - 37 U/L    ALT 16  0 - 53 U/L    Alkaline Phosphatase 44  39 - 117 U/L    Total Bilirubin 0.3  0.3 - 1.2 mg/dL    Bilirubin, Direct <8.4  0.0 - 0.3 mg/dL    Indirect Bilirubin NOT CALCULATED  0.3 - 0.9 mg/dL   TSH     Status: Normal   Collection Time   03/29/12  8:35 PM      Component Value Range Comment   TSH 2.471  0.350 - 4.500 uIU/mL  TROPONIN I     Status: Normal   Collection Time   03/29/12  8:35 PM      Component Value Range Comment   Troponin I <0.30  <0.30 ng/mL   VITAMIN B12     Status: Abnormal   Collection Time   03/29/12  8:35 PM      Component Value Range Comment   Vitamin B-12 1412 (*) 211 - 911 pg/mL   CORTISOL     Status: Normal   Collection Time   03/29/12  8:35 PM      Component Value Range Comment   Cortisol, Plasma 15.0     TROPONIN I     Status: Normal   Collection Time   03/30/12  2:15 AM      Component Value Range Comment   Troponin I <0.30  <0.30 ng/mL   BASIC METABOLIC PANEL     Status: Abnormal   Collection Time   03/30/12  2:15 AM      Component Value Range Comment   Sodium 125 (*) 135 - 145 mEq/L    Potassium 4.3  3.5 - 5.1 mEq/L    Chloride 90 (*) 96 - 112 mEq/L    CO2 24  19 -  32 mEq/L    Glucose, Bld 109 (*) 70 - 99 mg/dL    BUN 8  6 - 23 mg/dL    Creatinine, Ser 1.61  0.50 - 1.35 mg/dL    Calcium 9.0  8.4 - 09.6 mg/dL    GFR calc non Af Amer >90  >90 mL/min    GFR calc Af Amer >90  >90 mL/min   CBC     Status: Normal   Collection Time   03/30/12  2:15 AM      Component Value Range Comment   WBC 6.2  4.0 - 10.5 K/uL    RBC 4.39  4.22 - 5.81 MIL/uL    Hemoglobin 14.0  13.0 - 17.0 g/dL    HCT 04.5  40.9 - 81.1 %    MCV 90.0  78.0 - 100.0 fL    MCH 31.9  26.0 - 34.0 pg    MCHC 35.4  30.0 - 36.0 g/dL    RDW 91.4  78.2 - 95.6 %    Platelets 165  150 - 400 K/uL   LIPID PANEL     Status: Abnormal   Collection Time   03/30/12  2:15 AM      Component Value Range Comment   Cholesterol 228 (*) 0 - 200 mg/dL    Triglycerides 61  <213 mg/dL    HDL 086  >57 mg/dL    Total CHOL/HDL Ratio 2.3      VLDL 12  0 - 40 mg/dL    LDL Cholesterol 846 (*) 0 - 99 mg/dL   TROPONIN I     Status: Normal   Collection Time   03/30/12  8:10 AM      Component Value Range Comment   Troponin I <0.30  <0.30 ng/mL     Ct Head Wo Contrast  03/29/2012  *RADIOLOGY REPORT*  Clinical Data: Fall.  Headache.  Left periorbital bruising.  CT HEAD WITHOUT CONTRAST  Technique:  Contiguous axial images were obtained from the base of the skull through the vertex without contrast.  Comparison: None.  Findings: The brain stem, cerebellum, thalami, basal ganglia, ventricular system, and basilar cisterns appear unremarkable. Periventricular and corona radiata white matter hypodensities are most compatible with chronic ischemic microvascular white  matter disease.  No intracranial hemorrhage, mass lesion, or acute infarction is identified.  Left periorbital hematoma noted with a fracture of the orbital roof and superior orbital rim extending into the left frontal sinus, with fluid in the left frontal sinus.  The fracture of the superior orbital rim appears to be segmental, with a slightly depressed fragment into  the frontal sinus.  I suspect a nondisplaced fracture extension through the left frontal sinus into the anterior cranial fossa on the left, with a minuscule amount of gas tracking adjacent to the superior sagittal sinus/falx.  There is intraorbital hematoma on the left which appears to be primarily extraconal, as well as orbital gas.  Moreover, there is an air-fluid level in the left maxillary sinus which raises the possibility of an orbital floor fracture or maxillary fracture not included on today's exam. The visualized portion of the left zygomatic arch appears intact.  Atherosclerotic calcification of the carotid siphons noted.  IMPRESSION:  1.  Left frontal bone fracture extends through the frontal sinus and probably into the left anterior cranial fossa, with a segmental fracture of the left superior orbital rim mildly posteriorly displaced, with left superior orbital extraconal hematoma and gas. Equivocal pneumocephalus. 2.  Fluid in the left maxillary sinus raises the likelihood of the left inferior orbital or maxillary fracture which is not included on today's examination.  Dedicated maxillofacial CT is recommended for further characterization of the craniofacial fractures. 3. Periventricular and corona radiata white matter hypodensities are most compatible with chronic ischemic microvascular white matter disease.   Original Report Authenticated By: Gaylyn Rong, M.D.    Ct Maxillofacial Wo Cm  03/29/2012  *RADIOLOGY REPORT*  Clinical Data: Larey Seat, left frontal sinus fracture, evaluate maxillofacial region.  CT MAXILLOFACIAL WITHOUT CONTRAST  Technique:  Multidetector CT imaging of the maxillofacial structures was performed. Multiplanar CT image reconstructions were also generated.  Comparison: CT head earlier in the day.  Findings: There is a air-fluid level in the left maxillary sinus but the bony confines of the sinus appear intact.  This may be layering blood from the frontoethmoid region. There is  no blowout injury.  There is a comminuted superior orbital rim fracture on the left which involves the anterior wall of the frontal sinus or milk the semen is present.  Significant blood layers in the left frontal sinus and anterior left ethmoid air cells.  There is an extraconal hematoma superiorly which slightly displaces the globe anteriorly. The majority of the anterior orbital hematoma is preseptal in location.  There is no medial blowout fracture observed. The globe appears intact.  Right cataract has been removed.  There is no post- septal hematoma.  The zygoma is intact.  TMJs are located.  No nasal bone fracture. No visible intracranial extra-axial fluid collection.  IMPRESSION: Air-fluid level left maxillary sinus is not associated with blowout injury.  Comminuted superior orbital rim fracture on the left also involves the frontal sinus; there is extraconal hematoma, preseptal hematoma, proptosis, and orbital emphysema.   Original Report Authenticated By: Davonna Belling, M.D.     ZOX:[id number] except as listed in admit H&P  Blood pressure 138/69, pulse 80, temperature 98.1 F (36.7 C), temperature source Oral, resp. rate 16, height 6\' 1"  (1.854 m), weight 203 lb 9.6 oz (92.352 kg), SpO2 98.00%.  PHYSICAL EXAM: Overall appearance:  Healthy appearing, in no distress Head:  Swelling and ecchymosis around the left eye. Sutures in place in scalp. Eyes: Left subconjunctival hemorhage. Mild proptosis. Extra-occular muscles with normal  mobility in all directions. Gross visual acuity intact. Pupil reactive.  Ears: External ears are normal. Nose: External nose is healthy in appearance.  Oral Cavity:  There are no mucosal lesions or masses identified. Neuro:  No identifiable neurologic deficits. Neck: No palpable neck masses.  Studies Reviewed: Ct reviewed, left orbital roof and outer table frontal sinus fractures, with mild displacement of the latter.   Procedures: none   Assessment/Plan: Left  orbital roof and frontal sinus outer table fractures with minimal displacement. Surgical intervention not needed. Recommend Abx ointment for the lacerations and sutures out in 5-7 days.  Darin [name] 03/31/2012, 8:44 AM

## 2012-03-31 NOTE — Telephone Encounter (Signed)
Pt has been in hospital due to an accident (3 days) fell hit head. Pt says they have changed his meds around and he needs to speak w/ Dr Lovell Sheehan. Pt needs hospital follow up. Pt refused to see another provider. Pls advise.

## 2012-03-31 NOTE — Evaluation (Signed)
Occupational Therapy Evaluation Patient Details Name: Darin [name] MRN: [id number] DOB: [birth year] Today's Date: 03/31/2012 Time: 0840-0900 OT Time Calculation (min): 20 min  OT Assessment / Plan / Recommendation Clinical Impression  Beckham Capistran is a 79 y.o. male with a past medical history significant for hypertension, hyperlipidemia, gastroesophageal reflux disease, PMR and arthritis; came to the hospital after experiencing an episode of syncope and collapse. Patient states that he was trying to go to the bathroom and he just passed out.  pt displays some unsteadiness with ADL and functional transfers, some changes with vision due to not be able to fully open L eyelid, and will benefit from skilled OT services to improve safety and ADL independence.     OT Assessment  Patient needs continued OT Services    Follow Up Recommendations  Home health OT;Supervision/Assistance - 24 hour;Other (comment) (aide. Recommend SNF but pt refuses.)    Barriers to Discharge      Equipment Recommendations  None recommended by OT    Recommendations for Other Services    Frequency       Precautions / Restrictions Precautions Precautions: Fall Restrictions Weight Bearing Restrictions: No        ADL  Eating/Feeding: Simulated;Independent Where Assessed - Eating/Feeding: Chair Grooming: Simulated;Wash/dry hands;Min guard Where Assessed - Grooming: Unsupported standing Upper Body Bathing: Simulated;Chest;Right arm;Left arm;Abdomen;Set up Where Assessed - Upper Body Bathing: Unsupported sitting Lower Body Bathing: Simulated;Min guard Where Assessed - Lower Body Bathing: Supported sit to stand Upper Body Dressing: Simulated;Set up Where Assessed - Upper Body Dressing: Unsupported sitting Lower Body Dressing: Simulated;Min guard Where Assessed - Lower Body Dressing: Supported sit to stand Toilet Transfer: Performed;Min guard Acupuncturist: Comfort height toilet;Grab bars Toileting  - Architect and Hygiene: Simulated;Min guard Where Assessed - Engineer, mining and Hygiene: Sit to stand from 3-in-1 or toilet Tub/Shower Transfer: Performed;Min guard;Other (comment) (with holding to wall of shower) Equipment Used: Rolling walker ADL Comments: Cotx with PT. Used RW initially in hallway but then tried pt without RW use. He transferred into the bathroom and back out to his recliner without RW use. He is steadier when using RW with functional transfers. Issued a walker bag so pt can free hands up more and use RW . Discussed SNF option as pt lives alone and reports that he doesnt have any help at d/c. Pt refuses SNF however. He donned eyeglasses and declines any blurry vision but does state it is harder to see due to not being able to fully open L eye.     OT Diagnosis: Generalized weakness  OT Problem List: Decreased strength;Impaired balance (sitting and/or standing);Decreased knowledge of use of DME or AE OT Treatment Interventions: Self-care/ADL training;Therapeutic activities;DME and/or AE instruction;Patient/family education   OT Goals Acute Rehab OT Goals OT Goal Formulation: With patient Time For Goal Achievement: 04/14/12 Potential to Achieve Goals: Good ADL Goals Pt Will Perform Grooming: with modified independence;Standing at sink ADL Goal: Grooming - Progress: Goal set today Pt Will Transfer to Toilet: with modified independence;Ambulation;Comfort height toilet ADL Goal: Toilet Transfer - Progress: Goal set today Pt Will Perform Toileting - Clothing Manipulation: with modified independence;Standing ADL Goal: Toileting - Clothing Manipulation - Progress: Goal set today Pt Will Perform Tub/Shower Transfer: with modified independence;with DME;Shower transfer ADL Goal: Web designer - Progress: Goal set today Additional ADL Goal #1: Pt will be modified independent with bathing and dressing task at sink level and RW. ADL Goal: Additional  Goal #1 - Progress: Goal set today  Visit Information  Last OT Received On: 03/31/12 Assistance Needed: +1 PT/OT Co-Evaluation/Treatment: Yes    Subjective Data  Subjective: you will have a hard time getting me to agree to rehab Patient Stated Goal: none stated. doesnt want to go to rehab however.   Prior Functioning     Home Living Lives With: Alone Type of Home: House Home Access: Stairs to enter (back 0) Entrance Stairs-Number of Steps: back-1 small step. can also use garage Home Layout: One level Bathroom Shower/Tub: Health visitor: Handicapped height (vanity next to toilet) Home Adaptive Equipment: Grab bars in shower;Shower chair with back Prior Function Level of Independence: Independent Able to Take Stairs?: Yes Driving: Yes Communication Communication: No difficulties         Vision/Perception     Cognition  Overall Cognitive Status: Appears within functional limits for tasks assessed/performed Arousal/Alertness: Awake/alert Orientation Level: Appears intact for tasks assessed Behavior During Session: [facility] for tasks performed    Extremity/Trunk Assessment Right Upper Extremity Assessment RUE ROM/Strength/Tone: [facility] for tasks assessed Left Upper Extremity Assessment LUE ROM/Strength/Tone: WFL for tasks assessed     Mobility Bed Mobility Supine to Sit: 7: Independent Transfers Sit to Stand: 5: Supervision;From bed;4: Min guard;From toilet Stand to Sit: To chair/3-in-1;5: Supervision;4: Min guard;To toilet Details for Transfer Assistance: min guard for sit to stand and to sit on toilet with grab bar use. verbal cues for safety     Shoulder Instructions     Exercise     Balance     End of Session OT - End of Session Equipment Utilized During Treatment: Gait belt Activity Tolerance: Patient tolerated treatment well Patient left: in chair;with call bell/phone within reach  GO     Lennox Laity [phone] 03/31/2012,  11:23 AM

## 2012-03-31 NOTE — Progress Notes (Signed)
Physical Therapy Treatment Patient Details Name: Darin [name] MRN: [id number] DOB: 02-Dec-1931 Today's Date: 03/31/2012 Time: 4098-1191 PT Time Calculation (min): 21 min  PT Assessment / Plan / Recommendation Comments on Treatment Session  Pt scored 36/56 on Berg with a high risk for fall. Discussed findings with pt. and recommended that he have 24/7. HHPT for balance rehab.    Follow Up Recommendations  Home health PT     Does the patient have the potential to tolerate intense rehabilitation     Barriers to Discharge        Equipment Recommendations  Rolling walker with 5" wheels    Recommendations for Other Services    Frequency Min 3X/week   Plan Discharge plan remains appropriate;Frequency remains appropriate    Precautions / Restrictions Precautions Precautions: Fall   Pertinent Vitals/Pain     Mobility  Bed Mobility Supine to Sit: 7: Independent Transfers Sit to Stand: 5: Supervision;From bed;4: Min guard;From toilet Stand to Sit: To chair/3-in-1;5: Supervision;4: Min guard;To toilet Details for Transfer Assistance: min guard for sit to stand and to sit on toilet with grab bar use. verbal cues for safety Ambulation/Gait Ambulation/Gait Assistance Details: VCs safety, hand placement Gait Pattern: Decreased stride length;Decreased step length - right;Decreased stance time - right Gait velocity: decreased, slower at times as he regains his balance and momentum General Gait Details: pt. demonstrates decreased balance responses, decreased R foot flat at stance, noted tremors of legs. Pt used RW with slightly improved balance. Will perform Berg balance for standardized test for fall risk. Pt. reports he has no help.    Exercises     PT Diagnosis:    PT Problem List:   PT Treatment Interventions:     PT Goals Additional Goals Additional Goal #1: Pt will demonstrate good balance with functional activities, without LOB or cues for safety PT Goal: Additional Goal #1 -  Progress: Progressing toward goal  Visit Information  Last PT Received On: 03/31/12 Assistance Needed: +1    Subjective Data  Subjective: I understand, I will be careful   Cognition  Overall Cognitive Status: Appears within functional limits for tasks assessed/performed Arousal/Alertness: Awake/alert Orientation Level: Appears intact for tasks assessed Behavior During Session: [facility] for tasks performed    Balance  Standardized Balance Assessment Standardized Balance Assessment: Berg Balance Test Berg Balance Test Sit to Stand: Able to stand using hands after several tries Standing Unsupported: Able to stand safely 2 minutes Sitting with Back Unsupported but Feet Supported on Floor or Stool: Able to sit safely and securely 2 minutes Stand to Sit: Controls descent by using hands Transfers: Able to transfer safely, definite need of hands Standing Unsupported with Eyes Closed: Able to stand 10 seconds with supervision Standing Ubsupported with Feet Together: Needs help to attain position but able to stand for 30 seconds with feet together From Standing, Reach Forward with Outstretched Arm: Can reach confidently >25 cm (10") From Standing Position, Pick up Object from Floor: Able to pick up shoe, needs supervision From Standing Position, Turn to Look Behind Over each Shoulder: Looks behind from both sides and weight shifts well Turn 360 Degrees: Able to turn 360 degrees safely but slowly Standing Unsupported, Alternately Place Feet on Step/Stool: Needs assistance to keep from falling or unable to try Standing Unsupported, One Foot in Front: Needs help to step but can hold 15 seconds Standing on One Leg: Unable to try or needs assist to prevent fall Total Score: 36   End of Session PT -  End of Session Equipment Utilized During Treatment: Gait belt Activity Tolerance: Patient tolerated treatment well Patient left: in chair;with call bell/phone within reach Nurse Communication: Mobility  status   GP     Rada Hay 03/31/2012, 1:17 PM

## 2012-04-01 ENCOUNTER — Telehealth: Payer: Self-pay | Admitting: *Deleted

## 2012-04-01 ENCOUNTER — Encounter: Payer: Self-pay | Admitting: Internal Medicine

## 2012-04-01 ENCOUNTER — Ambulatory Visit (INDEPENDENT_AMBULATORY_CARE_PROVIDER_SITE_OTHER): Payer: Medicare Other | Admitting: Internal Medicine

## 2012-04-01 VITALS — BP 180/80 | HR 76 | Temp 98.2°F | Resp 16 | Ht 73.0 in | Wt 206.0 lb

## 2012-04-01 DIAGNOSIS — R55 Syncope and collapse: Secondary | ICD-10-CM

## 2012-04-01 DIAGNOSIS — I499 Cardiac arrhythmia, unspecified: Secondary | ICD-10-CM | POA: Diagnosis not present

## 2012-04-01 DIAGNOSIS — I10 Essential (primary) hypertension: Secondary | ICD-10-CM

## 2012-04-01 DIAGNOSIS — E871 Hypo-osmolality and hyponatremia: Secondary | ICD-10-CM

## 2012-04-01 DIAGNOSIS — I951 Orthostatic hypotension: Secondary | ICD-10-CM

## 2012-04-01 LAB — BASIC METABOLIC PANEL
BUN: 14 mg/dL (ref 6–23)
Calcium: 9.2 mg/dL (ref 8.4–10.5)
Creatinine, Ser: 0.6 mg/dL (ref 0.4–1.5)
GFR: 128.38 mL/min (ref >60.00)

## 2012-04-01 MED ORDER — AMLODIPINE BESYLATE 5 MG PO TABS: 5.0000 mg | ORAL_TABLET | Freq: Every day | ORAL | Status: DC

## 2012-04-01 NOTE — Telephone Encounter (Signed)
Talked with pt and he ov with dr Lovell Sheehan this pm

## 2012-04-01 NOTE — Progress Notes (Signed)
Subjective:    Patient ID: Darin [name], male    DOB: 02-21-1932, 79 y.o.   MRN: [id number]  HPI Patient is a 79 year old man who presents for post hospital followup for a syncopal episode.  It syncope and collapse with a significant contusion and laceration to his left temporal and frontal area of his face.  There is associated loss of consciousness and probable concussion.  In the hospital he was not detected to have a dysrhythmia but he was noted to have orthostatic hypotension with hyponatremia.  3 weeks prior to this he did have a slight drop in his sodium. He has been on a diuretic and an ACE inhibitor  A phone call was made immediately after discharge to set up his followup visit for a medicine reconciliation and to address concerns of the hospitalization per protocol and documented by my nurse Georgina Quint.  Review of Systems  Constitutional: Positive for fatigue. Negative for fever.  HENT: Negative for hearing loss, congestion, neck pain and postnasal drip.   Eyes: Negative for discharge, redness and visual disturbance.  Respiratory: Negative for cough, shortness of breath and wheezing.   Cardiovascular: Negative for leg swelling.  Gastrointestinal: Negative for abdominal pain, constipation and abdominal distention.  Genitourinary: Negative for urgency and frequency.  Musculoskeletal: Negative for joint swelling and arthralgias.  Skin: Positive for wound. Negative for color change and rash.       Significant ecchymosis bruising and laceration  Neurological: Negative for weakness and light-headedness.  Hematological: Negative for adenopathy.  Psychiatric/Behavioral: Negative for behavioral problems.   Past Medical History  Diagnosis Date  . GERD (gastroesophageal reflux disease)   . Hyperlipidemia   . Hypertension   . Arthritis   . PMR (polymyalgia rheumatica)   . Knee pain   . Osteoporosis   . H/O hiatal hernia     "FOR YEARS" " NO PROBLEM"  . Neuropathy    PERIPHERAL   . Polymyalgia 5 YEARS    History   Social History  . Marital Status: Widowed    Spouse Name: N/A    Number of Children: N/A  . Years of Education: N/A   Occupational History  . retired    Social History Main Topics  . Smoking status: Never Smoker   . Smokeless tobacco: Never Used  . Alcohol Use: Yes  . Drug Use: No  . Sexually Active: Yes   Other Topics Concern  . Not on file   Social History Narrative  . No narrative on file    Past Surgical History  Procedure Date  . Tonsillectomy   . Carpal tunnel release   . Varicose vein surgery   . Cataract extraction     right  . Inguinal hernia repair 10/14/2011    Procedure: HERNIA REPAIR INGUINAL ADULT;  Surgeon: Shelly Rubenstein, MD;  Location: [facility] OR;  Service: General;  Laterality: Right;  Right Inguinal Hernia Repair with Mesh    Family History  Problem Relation Age of Onset  . Coronary artery disease    . Heart disease Father     Allergies  Allergen Reactions  . Statins Other (See Comments)    Muscle pain and upset stomach    Current Outpatient Prescriptions on File Prior to Visit  Medication Sig Dispense Refill  . aspirin 81 MG tablet Take 81 mg by mouth at bedtime.       . Calcium Carb-Cholecalciferol (CALCIUM 500 +D) 500-400 MG-UNIT TABS Take by mouth 2 (two) times daily.        Marland Kitchen  famotidine (PEPCID) 20 MG tablet Take 20 mg by mouth at bedtime.       . fish oil-omega-3 fatty acids 1000 MG capsule Take 2 g by mouth 2 (two) times daily.        . Folic Acid-Vit B6-Vit B12 (FOLBEE) 2.5-25-1 MG TABS Take 1 tablet by mouth daily.  90 tablet  3  . LEVITRA 20 MG tablet TAKE 1 TABLET DAILY AS NEEDED.  8 tablet  0  . magnesium oxide (MAG-OX 400) 400 MG tablet Take 1 tablet (400 mg total) by mouth daily.  1 tablet  11  . meloxicam (MOBIC) 15 MG tablet Take 1 tablet (15 mg total) by mouth daily.  90 tablet  3  . neomycin-bacitracin-polymyxin (NEOSPORIN) [phone] ointment Apply topically 2 (two) times  daily.  28.3 g  0  . niacin 500 MG tablet Take 500 mg by mouth 2 (two) times daily with a meal.       . polyethylene glycol (MIRALAX / GLYCOLAX) packet Take 17 g by mouth 2 (two) times daily.        . saw palmetto 80 MG capsule Take 80 mg by mouth daily.       . vitamin C (ASCORBIC ACID) 500 MG tablet Take 500 mg by mouth daily.        BP 180/80  Pulse 76  Temp 98.2 F (36.8 C)  Resp 16  Ht 6\' 1"  (1.854 m)  Wt 206 lb (93.441 kg)  BMI 27.18 kg/m2       Objective:   Physical Exam  Constitutional: He appears well-developed and well-nourished.  HENT:  Head: Normocephalic and atraumatic.  Eyes: Conjunctivae normal are normal. Pupils are equal, round, and reactive to light.  Neck: Normal range of motion. Neck supple.  Cardiovascular: Normal rate and regular rhythm.   Murmur heard. Pulmonary/Chest: Effort normal and breath sounds normal.  Abdominal: Soft. Bowel sounds are normal.  Skin: Rash noted. There is erythema.       Laceration above the left eye          Assessment & Plan:  Syncope and collapse associated with hyponatremia and orthostatic hypotension Possible dehydration on an ACE inhibitor with exacerbation of hyponatremia Patient's PNET will be repeated today as well as a urine osmolality and urine sodium and potassium.  We will look for possible SIADH 4 consider ACE inhibitor reaction We'll consider concussion as a possible etiology  Monitor blood pressure on amlodipine Repeat basic metabolic panel in 3 days   I have spent more than 30 minutes examining this patient face-to-face of which over half was spent in counseling

## 2012-04-01 NOTE — Patient Instructions (Addendum)
We are not going to take the Florinef will get a change you to plane Lotensin we may use a calcium channel blocker to control your blood pressure as we monitor your potassium and your sodium to see if they return to normal I'm going to obtain a urine sample to make sure that you're electrolytes and urine are normal

## 2012-04-01 NOTE — Telephone Encounter (Signed)
Called pt -pt has laceration on skull from fall that has been sutured -no problems with incision.Medication:  Several medicines were changed while in hospital, but pt would not take new medication. He is currently on medication regimen that he was on before going to the hospital.  He had hyponatremia and hypokalemia and therefore needs to change meds .  Will try to make post hospital visit asap. Pt states his appetite is good and is sleeping well. He has not had any syncopal episodes since discharge from hospital.----Made appointment for this pm-

## 2012-04-02 DIAGNOSIS — E871 Hypo-osmolality and hyponatremia: Secondary | ICD-10-CM | POA: Diagnosis not present

## 2012-04-02 DIAGNOSIS — R55 Syncope and collapse: Secondary | ICD-10-CM | POA: Diagnosis not present

## 2012-04-02 NOTE — Addendum Note (Signed)
Addended by: Rita Ohara R on: 04/02/2012 09:43 AM   Modules accepted: Orders

## 2012-04-03 ENCOUNTER — Other Ambulatory Visit (INDEPENDENT_AMBULATORY_CARE_PROVIDER_SITE_OTHER): Payer: Medicare Other

## 2012-04-03 DIAGNOSIS — I10 Essential (primary) hypertension: Secondary | ICD-10-CM

## 2012-04-03 DIAGNOSIS — R55 Syncope and collapse: Secondary | ICD-10-CM

## 2012-04-03 DIAGNOSIS — E871 Hypo-osmolality and hyponatremia: Secondary | ICD-10-CM | POA: Diagnosis not present

## 2012-04-03 LAB — BASIC METABOLIC PANEL
BUN: 17 mg/dL (ref 6–23)
Calcium: 9.6 mg/dL (ref 8.4–10.5)
Chloride: 96 mEq/L (ref 96–112)
Creatinine, Ser: 0.8 mg/dL (ref 0.4–1.5)

## 2012-04-03 LAB — OSMOLALITY, URINE: Osmolality, Ur: 585 mOsm/kg (ref 390–1090)

## 2012-04-03 LAB — SODIUM, URINE, RANDOM: Sodium, Ur: 76 mEq/L

## 2012-04-03 LAB — POTASSIUM, URINE, RANDOM: Potassium Urine: 36 mEq/L

## 2012-04-05 ENCOUNTER — Encounter: Payer: Self-pay | Admitting: Internal Medicine

## 2012-04-05 NOTE — Progress Notes (Signed)
Subjective:    Patient ID: Darin [name], male    DOB: March 13, 1932, 79 y.o.   MRN: [id number]  HPI Had a fall in [place] at opera.Fell with injury to knee. No other symptoms. Not post ictal no palpitations. No recent illness or change in medications. Hx of Anemia. Hx of HTN of ACE and diuretic. No orthostatic symptoms   Review of Systems  Constitutional: Negative for fever and fatigue.  HENT: Negative for hearing loss, congestion, neck pain and postnasal drip.   Eyes: Negative for discharge, redness and visual disturbance.  Respiratory: Negative for cough, shortness of breath and wheezing.   Cardiovascular: Negative for palpitations and leg swelling.  Gastrointestinal: Negative for abdominal pain, constipation and abdominal distention.  Genitourinary: Negative for urgency and frequency.  Musculoskeletal: Negative for joint swelling and arthralgias.  Skin: Negative for color change and rash.  Neurological: Positive for dizziness, syncope and light-headedness. Negative for seizures, speech difficulty and weakness.  Hematological: Negative for adenopathy.  Psychiatric/Behavioral: Negative for behavioral problems.   Past Medical History  Diagnosis Date  . GERD (gastroesophageal reflux disease)   . Hyperlipidemia   . Hypertension   . Arthritis   . PMR (polymyalgia rheumatica)   . Knee pain   . Osteoporosis   . H/O hiatal hernia     "FOR YEARS" " NO PROBLEM"  . Neuropathy     PERIPHERAL   . Polymyalgia 5 YEARS    History   Social History  . Marital Status: Widowed    Spouse Name: N/A    Number of Children: N/A  . Years of Education: N/A   Occupational History  . retired    Social History Main Topics  . Smoking status: Never Smoker   . Smokeless tobacco: Never Used  . Alcohol Use: Yes  . Drug Use: No  . Sexually Active: Yes   Other Topics Concern  . Not on file   Social History Narrative  . No narrative on file    Past Surgical History  Procedure Date  .  Tonsillectomy   . Carpal tunnel release   . Varicose vein surgery   . Cataract extraction     right  . Inguinal hernia repair 10/14/2011    Procedure: HERNIA REPAIR INGUINAL ADULT;  Surgeon: Shelly Rubenstein, MD;  Location: [facility] OR;  Service: General;  Laterality: Right;  Right Inguinal Hernia Repair with Mesh    Family History  Problem Relation Age of Onset  . Coronary artery disease    . Heart disease Father     Allergies  Allergen Reactions  . Statins Other (See Comments)    Muscle pain and upset stomach    Current Outpatient Prescriptions on File Prior to Visit  Medication Sig Dispense Refill  . aspirin 81 MG tablet Take 81 mg by mouth at bedtime.       . Calcium Carb-Cholecalciferol (CALCIUM 500 +D) 500-400 MG-UNIT TABS Take by mouth 2 (two) times daily.        . famotidine (PEPCID) 20 MG tablet Take 20 mg by mouth at bedtime.       . fish oil-omega-3 fatty acids 1000 MG capsule Take 2 g by mouth 2 (two) times daily.        . Folic Acid-Vit B6-Vit B12 (FOLBEE) 2.5-25-1 MG TABS Take 1 tablet by mouth daily.  90 tablet  3  . magnesium oxide (MAG-OX 400) 400 MG tablet Take 1 tablet (400 mg total) by mouth daily.  1 tablet  11  .  niacin 500 MG tablet Take 500 mg by mouth 2 (two) times daily with a meal.       . polyethylene glycol (MIRALAX / GLYCOLAX) packet Take 17 g by mouth 2 (two) times daily.        . saw palmetto 80 MG capsule Take 80 mg by mouth daily.       . vitamin C (ASCORBIC ACID) 500 MG tablet Take 500 mg by mouth daily.      Marland Kitchen amLODipine (NORVASC) 5 MG tablet Take 1 tablet (5 mg total) by mouth daily.  30 tablet  3  . LEVITRA 20 MG tablet TAKE 1 TABLET DAILY AS NEEDED.  8 tablet  0    BP 140/80  Pulse 72  Temp 98.1 F (36.7 C)  Resp 16  Ht 6\' 1"  (1.854 m)  Wt 212 lb (96.163 kg)  BMI 27.97 kg/m2       Objective:   Physical Exam  Nursing note and vitals reviewed. Constitutional: He appears well-developed and well-nourished.  HENT:  Head: Normocephalic  and atraumatic.  Eyes: Conjunctivae normal are normal. Pupils are equal, round, and reactive to light.  Neck: Normal range of motion. Neck supple.  Cardiovascular: Normal rate and regular rhythm.   Murmur heard. Pulmonary/Chest: Effort normal and breath sounds normal.  Abdominal: Soft. Bowel sounds are normal.  Musculoskeletal: He exhibits edema and tenderness.  Skin:       bruising          Assessment & Plan:  Syncope and collapse Needs to stay hydrated Event monitor Check cbc and bmet consider  Neuro and cardiac referral for recurrent symptoms

## 2012-04-08 ENCOUNTER — Ambulatory Visit (INDEPENDENT_AMBULATORY_CARE_PROVIDER_SITE_OTHER): Payer: Medicare Other | Admitting: Internal Medicine

## 2012-04-08 ENCOUNTER — Other Ambulatory Visit: Payer: Self-pay | Admitting: *Deleted

## 2012-04-08 DIAGNOSIS — M79609 Pain in unspecified limb: Secondary | ICD-10-CM | POA: Diagnosis not present

## 2012-04-08 DIAGNOSIS — Z4802 Encounter for removal of sutures: Secondary | ICD-10-CM | POA: Diagnosis not present

## 2012-04-08 DIAGNOSIS — B351 Tinea unguium: Secondary | ICD-10-CM | POA: Diagnosis not present

## 2012-04-08 DIAGNOSIS — I10 Essential (primary) hypertension: Secondary | ICD-10-CM

## 2012-04-10 ENCOUNTER — Ambulatory Visit (INDEPENDENT_AMBULATORY_CARE_PROVIDER_SITE_OTHER): Payer: Medicare Other

## 2012-04-10 DIAGNOSIS — I951 Orthostatic hypotension: Secondary | ICD-10-CM

## 2012-04-10 DIAGNOSIS — R55 Syncope and collapse: Secondary | ICD-10-CM | POA: Diagnosis not present

## 2012-04-10 DIAGNOSIS — I499 Cardiac arrhythmia, unspecified: Secondary | ICD-10-CM

## 2012-04-10 NOTE — Progress Notes (Signed)
Placed a 48  hr holter on patient and went over instructions on how to use it and when to return it 

## 2012-04-10 NOTE — Progress Notes (Signed)
  Subjective:    Patient ID: Darin [name], male    DOB: 22-Jan-1932, 79 y.o.   MRN: [id number]  HPI  Patient had a fall and was seen in the emergency room had sutures placed about the left orbit  Review of Systems     Objective:   Physical Exam  Vicryl sutures about the left eye      Assessment & Plan:  suture removal Excellent approximation of the wound without any evidence of dehiscence Patient was instructed in appropriate wound care Patient tolerated the suture removal without complications

## 2012-04-20 ENCOUNTER — Telehealth: Payer: Self-pay | Admitting: *Deleted

## 2012-04-20 ENCOUNTER — Other Ambulatory Visit: Payer: Self-pay | Admitting: *Deleted

## 2012-04-20 DIAGNOSIS — I10 Essential (primary) hypertension: Secondary | ICD-10-CM

## 2012-04-20 DIAGNOSIS — J069 Acute upper respiratory infection, unspecified: Secondary | ICD-10-CM

## 2012-04-20 MED ORDER — AMLODIPINE BESYLATE 5 MG PO TABS: 5.0000 mg | ORAL_TABLET | Freq: Every day | ORAL | Status: DC

## 2012-04-20 NOTE — Telephone Encounter (Signed)
Called with chest congestion , and states he may have pneumonia. Per dr Lovell Sheehan- get cxr in am and see padonda

## 2012-04-21 ENCOUNTER — Encounter: Payer: Self-pay | Admitting: Family

## 2012-04-21 ENCOUNTER — Ambulatory Visit (INDEPENDENT_AMBULATORY_CARE_PROVIDER_SITE_OTHER)
Admission: RE | Admit: 2012-04-21 | Discharge: 2012-04-21 | Disposition: A | Discharge status: Home / Self Care | Payer: Medicare Other | Source: Ambulatory Visit | Attending: Internal Medicine | Admitting: Internal Medicine

## 2012-04-21 ENCOUNTER — Ambulatory Visit (INDEPENDENT_AMBULATORY_CARE_PROVIDER_SITE_OTHER): Payer: Medicare Other | Admitting: Family

## 2012-04-21 VITALS — BP 152/84 | HR 81 | Temp 97.6°F | Wt 205.0 lb

## 2012-04-21 DIAGNOSIS — J449 Chronic obstructive pulmonary disease, unspecified: Secondary | ICD-10-CM | POA: Diagnosis not present

## 2012-04-21 DIAGNOSIS — J069 Acute upper respiratory infection, unspecified: Secondary | ICD-10-CM

## 2012-04-21 DIAGNOSIS — R05 Cough: Secondary | ICD-10-CM | POA: Diagnosis not present

## 2012-04-21 NOTE — Patient Instructions (Addendum)

## 2012-04-21 NOTE — Progress Notes (Signed)
Subjective:    Patient ID: Darin [name], male    DOB: 1931-09-18, 79 y.o.   MRN: [id number]  HPI  79 year old white male, nonsmoker, patient of Dr. Lovell Sheehan is in today with complaints of chest congestion x2 weeks. Cough is productive with white phlegm. Denies any fever, muscle aches or pain. No history of smoking. Had a chest x-ray done just prior to this visit that was consistent with COPD. But no pneumonia.   Review of Systems  Constitutional: Negative.   HENT: Negative.   Respiratory: Positive for cough and wheezing.   Cardiovascular: Negative.   Musculoskeletal: Negative.   Skin: Negative.   Hematological: Negative.   Psychiatric/Behavioral: Negative.    Past Medical History  Diagnosis Date  . GERD (gastroesophageal reflux disease)   . Hyperlipidemia   . Hypertension   . Arthritis   . PMR (polymyalgia rheumatica)   . Knee pain   . Osteoporosis   . H/O hiatal hernia     "FOR YEARS" " NO PROBLEM"  . Neuropathy     PERIPHERAL   . Polymyalgia 5 YEARS    History   Social History  . Marital Status: Widowed    Spouse Name: N/A    Number of Children: N/A  . Years of Education: N/A   Occupational History  . retired    Social History Main Topics  . Smoking status: Never Smoker   . Smokeless tobacco: Never Used  . Alcohol Use: Yes  . Drug Use: No  . Sexually Active: Yes   Other Topics Concern  . Not on file   Social History Narrative  . No narrative on file    Past Surgical History  Procedure Date  . Tonsillectomy   . Carpal tunnel release   . Varicose vein surgery   . Cataract extraction     right  . Inguinal hernia repair 10/14/2011    Procedure: HERNIA REPAIR INGUINAL ADULT;  Surgeon: Shelly Rubenstein, MD;  Location: [facility] OR;  Service: General;  Laterality: Right;  Right Inguinal Hernia Repair with Mesh    Family History  Problem Relation Age of Onset  . Coronary artery disease    . Heart disease Father     Allergies  Allergen Reactions  .  Statins Other (See Comments)    Muscle pain and upset stomach    Current Outpatient Prescriptions on File Prior to Visit  Medication Sig Dispense Refill  . amLODipine (NORVASC) 5 MG tablet Take 1 tablet (5 mg total) by mouth daily.  90 tablet  3  . aspirin 81 MG tablet Take 81 mg by mouth at bedtime.       . Calcium Carb-Cholecalciferol (CALCIUM 500 +D) 500-400 MG-UNIT TABS Take by mouth 2 (two) times daily.        . famotidine (PEPCID) 20 MG tablet Take 20 mg by mouth at bedtime.       . fish oil-omega-3 fatty acids 1000 MG capsule Take 2 g by mouth 2 (two) times daily.        . Folic Acid-Vit B6-Vit B12 (FOLBEE) 2.5-25-1 MG TABS Take 1 tablet by mouth daily.  90 tablet  3  . LEVITRA 20 MG tablet TAKE 1 TABLET DAILY AS NEEDED.  8 tablet  0  . magnesium oxide (MAG-OX 400) 400 MG tablet Take 1 tablet (400 mg total) by mouth daily.  1 tablet  11  . meloxicam (MOBIC) 15 MG tablet Take 1 tablet (15 mg total) by mouth daily.  90 tablet  3  . neomycin-bacitracin-polymyxin (NEOSPORIN) [phone] ointment Apply topically 2 (two) times daily.  28.3 g  0  . niacin 500 MG tablet Take 500 mg by mouth 2 (two) times daily with a meal.       . polyethylene glycol (MIRALAX / GLYCOLAX) packet Take 17 g by mouth 2 (two) times daily.        . saw palmetto 80 MG capsule Take 80 mg by mouth daily.       . vitamin C (ASCORBIC ACID) 500 MG tablet Take 500 mg by mouth daily.        BP 152/84  Pulse 81  Temp 97.6 F (36.4 C) (Oral)  Wt 205 lb (92.987 kg)  SpO2 96%    Objective:   Physical Exam  Constitutional: He is oriented to person, place, and time. He appears well-developed and well-nourished.  HENT:  Right Ear: External ear normal.  Left Ear: External ear normal.  Nose: Nose normal.  Mouth/Throat: Oropharynx is clear and moist.  Neck: Normal range of motion. Neck supple.  Cardiovascular: Normal rate and normal heart sounds.   Pulmonary/Chest: Effort normal and breath sounds normal.    Musculoskeletal: Normal range of motion.  Neurological: He is alert and oriented to person, place, and time.  Skin: Skin is warm and dry.  Psychiatric: He has a normal mood and affect.          Assessment & Plan:  Assessment:  1. COPD 2. Cough  Plan: Symbicort 160/4.52 puffs twice a day. Drink plenty of fluids. Patient call the office if symptoms worsen or persist. Recheck a schedule, and as needed.

## 2012-04-30 ENCOUNTER — Other Ambulatory Visit: Payer: Self-pay | Admitting: *Deleted

## 2012-04-30 DIAGNOSIS — E871 Hypo-osmolality and hyponatremia: Secondary | ICD-10-CM

## 2012-04-30 DIAGNOSIS — I10 Essential (primary) hypertension: Secondary | ICD-10-CM | POA: Diagnosis not present

## 2012-04-30 DIAGNOSIS — I951 Orthostatic hypotension: Secondary | ICD-10-CM | POA: Diagnosis not present

## 2012-04-30 DIAGNOSIS — R55 Syncope and collapse: Secondary | ICD-10-CM

## 2012-04-30 DIAGNOSIS — I499 Cardiac arrhythmia, unspecified: Secondary | ICD-10-CM | POA: Diagnosis not present

## 2012-04-30 MED ORDER — FOLIC ACID-VIT B6-VIT B12 2.5-25-1 MG PO TABS: 1.0000 | ORAL_TABLET | Freq: Every day | ORAL | Status: DC

## 2012-05-09 ENCOUNTER — Other Ambulatory Visit: Payer: Self-pay

## 2012-06-15 ENCOUNTER — Ambulatory Visit (INDEPENDENT_AMBULATORY_CARE_PROVIDER_SITE_OTHER): Payer: Medicare Other | Admitting: Internal Medicine

## 2012-06-15 ENCOUNTER — Encounter: Payer: Self-pay | Admitting: Internal Medicine

## 2012-06-15 VITALS — BP 132/66 | HR 72 | Temp 97.4°F | Wt 212.0 lb

## 2012-06-15 DIAGNOSIS — I10 Essential (primary) hypertension: Secondary | ICD-10-CM | POA: Diagnosis not present

## 2012-06-15 DIAGNOSIS — J4489 Other specified chronic obstructive pulmonary disease: Secondary | ICD-10-CM | POA: Diagnosis not present

## 2012-06-15 DIAGNOSIS — D638 Anemia in other chronic diseases classified elsewhere: Secondary | ICD-10-CM

## 2012-06-15 DIAGNOSIS — J449 Chronic obstructive pulmonary disease, unspecified: Secondary | ICD-10-CM

## 2012-06-15 DIAGNOSIS — E871 Hypo-osmolality and hyponatremia: Secondary | ICD-10-CM | POA: Diagnosis not present

## 2012-06-15 DIAGNOSIS — Z209 Contact with and (suspected) exposure to unspecified communicable disease: Secondary | ICD-10-CM

## 2012-06-15 DIAGNOSIS — Z23 Encounter for immunization: Secondary | ICD-10-CM

## 2012-06-15 LAB — CBC WITH DIFFERENTIAL/PLATELET
Basophils Relative: 0.5 % (ref 0.0–3.0)
Eosinophils Absolute: 0.1 10*3/uL (ref 0.0–0.7)
Eosinophils Relative: 1.9 % (ref 0.0–5.0)
Lymphocytes Relative: 19.2 % (ref 12.0–46.0)
MCHC: 34.3 g/dL (ref 30.0–36.0)
MCV: 96.1 fl (ref 78.0–100.0)
Monocytes Absolute: 0.8 10*3/uL (ref 0.1–1.0)
Neutrophils Relative %: 65.6 % (ref 43.0–77.0)
Platelets: 200 10*3/uL (ref 150.0–400.0)
RBC: 4.25 Mil/uL (ref 4.22–5.81)
WBC: 6.3 10*3/uL (ref 4.5–10.5)

## 2012-06-15 LAB — BASIC METABOLIC PANEL
Chloride: 96 mEq/L (ref 96–112)
GFR: 100.63 mL/min (ref >60.00)
Potassium: 4.4 mEq/L (ref 3.5–5.1)
Sodium: 134 mEq/L — ABNORMAL LOW (ref 135–145)

## 2012-06-15 MED ORDER — DOXYCYCLINE HYCLATE 100 MG PO TABS: 100.0000 mg | ORAL_TABLET | Freq: Two times a day (BID) | ORAL | Status: DC

## 2012-06-15 MED ORDER — CIPROFLOXACIN HCL 500 MG PO TABS: 500.0000 mg | ORAL_TABLET | Freq: Two times a day (BID) | ORAL | Status: DC

## 2012-06-15 NOTE — Patient Instructions (Signed)
Due to travel medicine at  [facility] hospital for the typhoid and to discuss the rabies vaccination

## 2012-06-15 NOTE — Progress Notes (Signed)
Subjective:    Patient ID: Darin [name], male    DOB: [birth year], 79 y.o.   MRN: [id number]  HPI We will prepare him for history of cadaveric by placing her on doxycycline for Lariam prophylaxis and give him a prescription for Cipro for traveler's diarrhea.  He referred to travel medicine for all of his immunizations with the exception of the hepatitis A and B. Wood series which we will start at this office visit   Review of Systems  Constitutional: Negative for fever and fatigue.  HENT: Negative for hearing loss, congestion, neck pain and postnasal drip.   Eyes: Negative for discharge, redness and visual disturbance.  Respiratory: Negative for cough, shortness of breath and wheezing.   Cardiovascular: Negative for leg swelling.  Gastrointestinal: Negative for abdominal pain, constipation and abdominal distention.  Genitourinary: Negative for urgency and frequency.  Musculoskeletal: Negative for joint swelling and arthralgias.  Skin: Negative for color change and rash.  Neurological: Positive for light-headedness. Negative for weakness.  Hematological: Negative for adenopathy.  Psychiatric/Behavioral: Negative for behavioral problems.   Past Medical History  Diagnosis Date  . GERD (gastroesophageal reflux disease)   . Hyperlipidemia   . Hypertension   . Arthritis   . PMR (polymyalgia rheumatica)   . Knee pain   . Osteoporosis   . H/O hiatal hernia     "FOR YEARS" " NO PROBLEM"  . Neuropathy     PERIPHERAL   . Polymyalgia 5 YEARS    History   Social History  . Marital Status: Widowed    Spouse Name: N/A    Number of Children: N/A  . Years of Education: N/A   Occupational History  . retired    Social History Main Topics  . Smoking status: Never Smoker   . Smokeless tobacco: Never Used  . Alcohol Use: Yes  . Drug Use: No  . Sexually Active: Yes   Other Topics Concern  . Not on file   Social History Narrative  . No narrative on file    Past Surgical  History  Procedure Laterality Date  . Tonsillectomy    . Carpal tunnel release    . Varicose vein surgery    . Cataract extraction      right  . Inguinal hernia repair  10/14/2011    Procedure: HERNIA REPAIR INGUINAL ADULT;  Surgeon: Shelly Rubenstein, MD;  Location: [facility] OR;  Service: General;  Laterality: Right;  Right Inguinal Hernia Repair with Mesh    Family History  Problem Relation Age of Onset  . Coronary artery disease    . Heart disease Father     Allergies  Allergen Reactions  . Statins Other (See Comments)    Muscle pain and upset stomach    Current Outpatient Prescriptions on File Prior to Visit  Medication Sig Dispense Refill  . amLODipine (NORVASC) 5 MG tablet Take 1 tablet (5 mg total) by mouth daily.  90 tablet  3  . aspirin 81 MG tablet Take 81 mg by mouth at bedtime.       . Calcium Carb-Cholecalciferol (CALCIUM 500 +D) 500-400 MG-UNIT TABS Take by mouth 2 (two) times daily.        . famotidine (PEPCID) 20 MG tablet Take 20 mg by mouth at bedtime.       . fish oil-omega-3 fatty acids 1000 MG capsule Take 2 g by mouth 2 (two) times daily.        . Folic Acid-Vit B6-Vit B12 (FOLBEE) 2.5-25-1 MG TABS  Take 1 tablet by mouth daily.  90 tablet  3  . LEVITRA 20 MG tablet TAKE 1 TABLET DAILY AS NEEDED.  8 tablet  0  . magnesium oxide (MAG-OX 400) 400 MG tablet Take 1 tablet (400 mg total) by mouth daily.  1 tablet  11  . meloxicam (MOBIC) 15 MG tablet Take 1 tablet (15 mg total) by mouth daily.  90 tablet  3  . neomycin-bacitracin-polymyxin (NEOSPORIN) [phone] ointment Apply topically 2 (two) times daily.  28.3 g  0  . niacin 500 MG tablet Take 500 mg by mouth 2 (two) times daily with a meal.       . polyethylene glycol (MIRALAX / GLYCOLAX) packet Take 17 g by mouth 2 (two) times daily.        . saw palmetto 80 MG capsule Take 80 mg by mouth daily.       . vitamin C (ASCORBIC ACID) 500 MG tablet Take 500 mg by mouth daily.       No current facility-administered  medications on file prior to visit.    BP 132/66  Pulse 72  Temp(Src) 97.4 F (36.3 C) (Oral)  Wt 212 lb (96.163 kg)  BMI 27.98 kg/m2       Objective:   Physical Exam  Nursing note and vitals reviewed. Constitutional: He appears well-developed and well-nourished.  HENT:  Head: Normocephalic and atraumatic.  Eyes: Conjunctivae are normal. Pupils are equal, round, and reactive to light.  Neck: Normal range of motion. Neck supple.  Cardiovascular: Normal rate and regular rhythm.   Murmur heard. Pulmonary/Chest: Effort normal and breath sounds normal. No respiratory distress. He has no wheezes.  Abdominal: Soft. Bowel sounds are normal.          Assessment & Plan:   hepatitis A and B. Series to begin today prescription for doxycycline and Cipro given.  Stable medical problems addressed  GERD  COPD and weight is now stable Blood pressure stable Appropriate monitoring today including a basic metabolic panel

## 2012-06-15 NOTE — Addendum Note (Signed)
Addended by: Willy Eddy on: 06/15/2012 11:08 AM   Modules accepted: Orders

## 2012-07-01 DIAGNOSIS — M79609 Pain in unspecified limb: Secondary | ICD-10-CM | POA: Diagnosis not present

## 2012-07-01 DIAGNOSIS — B351 Tinea unguium: Secondary | ICD-10-CM | POA: Diagnosis not present

## 2012-07-17 ENCOUNTER — Ambulatory Visit (INDEPENDENT_AMBULATORY_CARE_PROVIDER_SITE_OTHER): Payer: Medicare Other | Admitting: Internal Medicine

## 2012-07-17 DIAGNOSIS — Z23 Encounter for immunization: Secondary | ICD-10-CM

## 2012-07-17 MED ORDER — EPINEPHRINE 0.3 MG/0.3ML IJ DEVI: 0.3000 mg | Freq: Once | INTRAMUSCULAR | Status: DC

## 2012-08-05 ENCOUNTER — Ambulatory Visit: Payer: Medicare Other | Admitting: Internal Medicine

## 2012-08-06 ENCOUNTER — Encounter: Payer: Self-pay | Admitting: Internal Medicine

## 2012-08-06 ENCOUNTER — Ambulatory Visit: Payer: Medicare Other | Admitting: Internal Medicine

## 2012-08-06 ENCOUNTER — Ambulatory Visit (INDEPENDENT_AMBULATORY_CARE_PROVIDER_SITE_OTHER): Payer: Medicare Other | Admitting: Internal Medicine

## 2012-08-06 VITALS — BP 130/60 | HR 64 | Temp 98.2°F | Resp 16 | Ht 73.0 in | Wt 212.0 lb

## 2012-08-06 DIAGNOSIS — G8929 Other chronic pain: Secondary | ICD-10-CM | POA: Diagnosis not present

## 2012-08-06 DIAGNOSIS — M5416 Radiculopathy, lumbar region: Secondary | ICD-10-CM | POA: Insufficient documentation

## 2012-08-06 DIAGNOSIS — IMO0002 Reserved for concepts with insufficient information to code with codable children: Secondary | ICD-10-CM | POA: Diagnosis not present

## 2012-08-06 DIAGNOSIS — M1710 Unilateral primary osteoarthritis, unspecified knee: Secondary | ICD-10-CM

## 2012-08-06 MED ORDER — TIZANIDINE HCL 4 MG PO TABS: 4.0000 mg | ORAL_TABLET | Freq: Four times a day (QID) | ORAL | Status: DC | PRN

## 2012-08-06 MED ORDER — METHYLPREDNISOLONE ACETATE 40 MG/ML IJ SUSP: 40.0000 mg | Freq: Once | INTRAMUSCULAR | Status: DC

## 2012-08-06 NOTE — Patient Instructions (Signed)
The patient is instructed to continue all medications as prescribed. Schedule followup with check out clerk upon leaving the clinic  

## 2012-08-06 NOTE — Progress Notes (Signed)
Subjective:    Patient ID: Darin [name], male    DOB: Dec 20, 1931, 79 y.o.   MRN: [id number]  Hypertension Pertinent negatives include no neck pain or shortness of breath.      Review of Systems  Constitutional: Negative for fever and fatigue.  HENT: Negative for hearing loss, congestion, neck pain and postnasal drip.   Eyes: Negative for discharge, redness and visual disturbance.  Respiratory: Negative for cough, shortness of breath and wheezing.   Cardiovascular: Negative for leg swelling.  Gastrointestinal: Negative for abdominal pain, constipation and abdominal distention.  Genitourinary: Negative for urgency and frequency.  Musculoskeletal: Positive for back pain and gait problem. Negative for joint swelling and arthralgias.  Skin: Negative for color change and rash.  Neurological: Positive for weakness and numbness. Negative for light-headedness.  Hematological: Negative for adenopathy.  Psychiatric/Behavioral: Negative for behavioral problems.   Past Medical History  Diagnosis Date  . GERD (gastroesophageal reflux disease)   . Hyperlipidemia   . Hypertension   . Arthritis   . PMR (polymyalgia rheumatica)   . Knee pain   . Osteoporosis   . H/O hiatal hernia     "FOR YEARS" " NO PROBLEM"  . Neuropathy     PERIPHERAL   . Polymyalgia 5 YEARS    History   Social History  . Marital Status: Widowed    Spouse Name: N/A    Number of Children: N/A  . Years of Education: N/A   Occupational History  . retired    Social History Main Topics  . Smoking status: Never Smoker   . Smokeless tobacco: Never Used  . Alcohol Use: Yes  . Drug Use: No  . Sexually Active: Yes   Other Topics Concern  . Not on file   Social History Narrative  . No narrative on file    Past Surgical History  Procedure Laterality Date  . Tonsillectomy    . Carpal tunnel release    . Varicose vein surgery    . Cataract extraction      right  . Inguinal hernia repair  10/14/2011   Procedure: HERNIA REPAIR INGUINAL ADULT;  Surgeon: Shelly Rubenstein, MD;  Location: [facility];  Service: General;  Laterality: Right;  Right Inguinal Hernia Repair with Mesh    Family History  Problem Relation Age of Onset  . Coronary artery disease    . Heart disease Father     Allergies  Allergen Reactions  . Statins Other (See Comments)    Muscle pain and upset stomach    Current Outpatient Prescriptions on File Prior to Visit  Medication Sig Dispense Refill  . amLODipine (NORVASC) 5 MG tablet Take 1 tablet (5 mg total) by mouth daily.  90 tablet  3  . aspirin 81 MG tablet Take 81 mg by mouth at bedtime.       . Calcium Carb-Cholecalciferol (CALCIUM 500 +D) 500-400 MG-UNIT TABS Take by mouth 2 (two) times daily.        Marland Kitchen doxycycline (VIBRA-TABS) 100 MG tablet Take 1 tablet (100 mg total) by mouth 2 (two) times daily. Start one week prior to trip continue 4 weeks after trip  60 tablet  1  . EPINEPHrine (EPI-PEN) 0.3 mg/0.3 mL DEVI Inject 0.3 mLs (0.3 mg total) into the muscle once.  1 Device  3  . famotidine (PEPCID) 20 MG tablet Take 20 mg by mouth at bedtime.       . fish oil-omega-3 fatty acids 1000 MG capsule Take 2 g  by mouth 2 (two) times daily.        . Folic Acid-Vit B6-Vit B12 (FOLBEE) 2.5-25-1 MG TABS Take 1 tablet by mouth daily.  90 tablet  3  . LEVITRA 20 MG tablet TAKE 1 TABLET DAILY AS NEEDED.  8 tablet  0  . meloxicam (MOBIC) 15 MG tablet Take 1 tablet (15 mg total) by mouth daily.  90 tablet  3  . neomycin-bacitracin-polymyxin (NEOSPORIN) [phone] ointment Apply topically 2 (two) times daily.  28.3 g  0  . niacin 500 MG tablet Take 500 mg by mouth 2 (two) times daily with a meal.       . polyethylene glycol (MIRALAX / GLYCOLAX) packet Take 17 g by mouth 2 (two) times daily.        . saw palmetto 80 MG capsule Take 80 mg by mouth daily.       . vitamin C (ASCORBIC ACID) 500 MG tablet Take 500 mg by mouth daily.       No current facility-administered medications on  file prior to visit.    BP 130/60  Pulse 64  Temp(Src) 98.2 F (36.8 C)  Resp 16  Ht 6\' 1"  (1.854 m)  Wt 212 lb (96.163 kg)  BMI 27.98 kg/m2       Objective:   Physical Exam  Nursing note and vitals reviewed. Constitutional: He appears well-developed and well-nourished.  HENT:  Head: Normocephalic and atraumatic.  Eyes: Conjunctivae are normal. Pupils are equal, round, and reactive to light.  Neck: Normal range of motion. Neck supple.  Cardiovascular: Normal rate and regular rhythm.   Murmur heard. Pulmonary/Chest: Effort normal and breath sounds normal.  Abdominal: Soft. Bowel sounds are normal.  Musculoskeletal: He exhibits edema and tenderness.  Right knee          Assessment & Plan:  See problem focused documetation

## 2012-08-06 NOTE — Assessment & Plan Note (Signed)
Patient has knee pain that is minimal in the left knee due to osteoarthritis but has a worsening complaint of pain radiating down the calf and foot on the left side that is most probably radicular in origin He has scoliosis and had an epidural done in June of 2013 which alleviated much of discomfort temporarily Referral for epidural is planned

## 2012-08-06 NOTE — Assessment & Plan Note (Signed)
Right knee pain rated as "terrible" Left knee pain is minimal Injection therapy for right knee  Informed consent obtained and the patient's knee was prepped with betadine. Local anesthesia was obtained with topical spray. Then 40 mg of Depo-Medrol and 1/2 cc of lidocaine was injected into the joint space. The patient tolerated the procedure without complications. Post injection care discussed with patient.

## 2012-09-02 ENCOUNTER — Other Ambulatory Visit: Payer: Self-pay | Admitting: *Deleted

## 2012-09-02 MED ORDER — BENAZEPRIL-HYDROCHLOROTHIAZIDE 10-12.5 MG PO TABS: 1.0000 | ORAL_TABLET | Freq: Every day | ORAL | Status: DC

## 2012-09-09 DIAGNOSIS — B309 Viral conjunctivitis, unspecified: Secondary | ICD-10-CM | POA: Diagnosis not present

## 2012-09-09 DIAGNOSIS — M5137 Other intervertebral disc degeneration, lumbosacral region: Secondary | ICD-10-CM | POA: Diagnosis not present

## 2012-09-23 DIAGNOSIS — B351 Tinea unguium: Secondary | ICD-10-CM | POA: Diagnosis not present

## 2012-09-23 DIAGNOSIS — M79609 Pain in unspecified limb: Secondary | ICD-10-CM | POA: Diagnosis not present

## 2012-09-24 DIAGNOSIS — M5137 Other intervertebral disc degeneration, lumbosacral region: Secondary | ICD-10-CM | POA: Diagnosis not present

## 2012-10-05 ENCOUNTER — Ambulatory Visit: Payer: Medicare Other | Admitting: Internal Medicine

## 2012-11-04 ENCOUNTER — Ambulatory Visit: Payer: Medicare Other | Admitting: Internal Medicine

## 2012-11-06 ENCOUNTER — Encounter: Payer: Self-pay | Admitting: Internal Medicine

## 2012-11-06 ENCOUNTER — Ambulatory Visit (INDEPENDENT_AMBULATORY_CARE_PROVIDER_SITE_OTHER): Payer: Medicare Other | Admitting: Internal Medicine

## 2012-11-06 VITALS — BP 136/60 | HR 80 | Temp 98.0°F | Resp 16 | Ht 73.0 in | Wt 208.0 lb

## 2012-11-06 DIAGNOSIS — I10 Essential (primary) hypertension: Secondary | ICD-10-CM | POA: Diagnosis not present

## 2012-11-06 DIAGNOSIS — D649 Anemia, unspecified: Secondary | ICD-10-CM

## 2012-11-06 DIAGNOSIS — E871 Hypo-osmolality and hyponatremia: Secondary | ICD-10-CM

## 2012-11-06 LAB — CBC WITH DIFFERENTIAL/PLATELET
Basophils Relative: 0.5 % (ref 0.0–3.0)
Eosinophils Relative: 0.9 % (ref 0.0–5.0)
HCT: 40.3 % (ref 39.0–52.0)
Hemoglobin: 13.7 g/dL (ref 13.0–17.0)
Lymphs Abs: 0.9 10*3/uL (ref 0.7–4.0)
MCV: 95.4 fl (ref 78.0–100.0)
Monocytes Absolute: 0.7 10*3/uL (ref 0.1–1.0)
Monocytes Relative: 9 % (ref 3.0–12.0)
Neutro Abs: 5.7 10*3/uL (ref 1.4–7.7)
WBC: 7.3 10*3/uL (ref 4.5–10.5)

## 2012-11-06 LAB — BASIC METABOLIC PANEL
BUN: 20 mg/dL (ref 6–23)
CO2: 32 mEq/L (ref 19–32)
Calcium: 9.4 mg/dL (ref 8.4–10.5)
Creatinine, Ser: 0.9 mg/dL (ref 0.4–1.5)
Glucose, Bld: 105 mg/dL — ABNORMAL HIGH (ref 70–99)

## 2012-11-06 NOTE — Progress Notes (Signed)
Subjective:    Patient ID: Darin [name], male    DOB: [birth year], 79 y.o.   MRN: [id number]  HPI Having arthritic pain and ambulation issues HTN Lipids COPD GERD     Review of Systems  Constitutional: Negative for fever and fatigue.  HENT: Negative for hearing loss, congestion, neck pain and postnasal drip.   Eyes: Negative for discharge, redness and visual disturbance.  Respiratory: Negative for cough, shortness of breath and wheezing.   Cardiovascular: Negative for leg swelling.  Gastrointestinal: Negative for abdominal pain, constipation and abdominal distention.  Genitourinary: Negative for urgency and frequency.  Musculoskeletal: Negative for joint swelling and arthralgias.  Skin: Negative for color change and rash.  Neurological: Negative for weakness and light-headedness.  Hematological: Negative for adenopathy.  Psychiatric/Behavioral: Negative for behavioral problems.   Past Medical History  Diagnosis Date  . GERD (gastroesophageal reflux disease)   . Hyperlipidemia   . Hypertension   . Arthritis   . PMR (polymyalgia rheumatica)   . Knee pain   . Osteoporosis   . H/O hiatal hernia     "FOR YEARS" " NO PROBLEM"  . Neuropathy     PERIPHERAL   . Polymyalgia 5 YEARS    History   Social History  . Marital Status: Widowed    Spouse Name: N/A    Number of Children: N/A  . Years of Education: N/A   Occupational History  . retired    Social History Main Topics  . Smoking status: Never Smoker   . Smokeless tobacco: Never Used  . Alcohol Use: Yes  . Drug Use: No  . Sexual Activity: Yes   Other Topics Concern  . Not on file   Social History Narrative  . No narrative on file    Past Surgical History  Procedure Laterality Date  . Tonsillectomy    . Carpal tunnel release    . Varicose vein surgery    . Cataract extraction      right  . Inguinal hernia repair  10/14/2011    Procedure: HERNIA REPAIR INGUINAL ADULT;  Surgeon: Shelly Rubenstein, MD;   Location: [facility];  Service: General;  Laterality: Right;  Right Inguinal Hernia Repair with Mesh    Family History  Problem Relation Age of Onset  . Coronary artery disease    . Heart disease Father     Allergies  Allergen Reactions  . Statins Other (See Comments)    Muscle pain and upset stomach    Current Outpatient Prescriptions on File Prior to Visit  Medication Sig Dispense Refill  . amLODipine (NORVASC) 5 MG tablet Take 1 tablet (5 mg total) by mouth daily.  90 tablet  3  . aspirin 81 MG tablet Take 81 mg by mouth at bedtime.       . benazepril-hydrochlorthiazide (LOTENSIN HCT) 10-12.5 MG per tablet Take 1 tablet by mouth daily.  90 tablet  3  . Calcium Carb-Cholecalciferol (CALCIUM 500 +D) 500-400 MG-UNIT TABS Take by mouth 2 (two) times daily.        Marland Kitchen doxycycline (VIBRA-TABS) 100 MG tablet Take 1 tablet (100 mg total) by mouth 2 (two) times daily. Start one week prior to trip continue 4 weeks after trip  60 tablet  1  . EPINEPHrine (EPI-PEN) 0.3 mg/0.3 mL DEVI Inject 0.3 mLs (0.3 mg total) into the muscle once.  1 Device  3  . famotidine (PEPCID) 20 MG tablet Take 20 mg by mouth at bedtime.       Marland Kitchen  fish oil-omega-3 fatty acids 1000 MG capsule Take 2 g by mouth 2 (two) times daily.        . Folic Acid-Vit B6-Vit B12 (FOLBEE) 2.5-25-1 MG TABS Take 1 tablet by mouth daily.  90 tablet  3  . LEVITRA 20 MG tablet TAKE 1 TABLET DAILY AS NEEDED.  8 tablet  0  . meloxicam (MOBIC) 15 MG tablet Take 1 tablet (15 mg total) by mouth daily.  90 tablet  3  . neomycin-bacitracin-polymyxin (NEOSPORIN) [phone] ointment Apply topically 2 (two) times daily.  28.3 g  0  . niacin 500 MG tablet Take 500 mg by mouth 2 (two) times daily with a meal.       . polyethylene glycol (MIRALAX / GLYCOLAX) packet Take 17 g by mouth 2 (two) times daily.        . saw palmetto 80 MG capsule Take 80 mg by mouth daily.       Marland Kitchen tiZANidine (ZANAFLEX) 4 MG tablet Take 1 tablet (4 mg total) by mouth every 6 (six)  hours as needed.  30 tablet  0  . vitamin C (ASCORBIC ACID) 500 MG tablet Take 500 mg by mouth daily.       No current facility-administered medications on file prior to visit.    BP 136/60  Pulse 80  Temp(Src) 98 F (36.7 C)  Resp 16  Ht 6\' 1"  (1.854 m)  Wt 208 lb (94.348 kg)  BMI 27.45 kg/m2       Objective:   Physical Exam  Nursing note and vitals reviewed. Constitutional: He appears well-developed and well-nourished.  HENT:  Head: Normocephalic and atraumatic.  Eyes: Conjunctivae are normal. Pupils are equal, round, and reactive to light.  Neck: Normal range of motion. Neck supple.  Cardiovascular: Normal rate and regular rhythm.   Pulmonary/Chest: Effort normal and breath sounds normal.  Abdominal: Soft. Bowel sounds are normal.   Non healing ulcers Several moles have increased in pigmentation He should be scheduled for a complete a check of his back and biopsies        Assessment & Plan:  HTN stable increased lipids COPD To be neck in context of hypertension on multidrug therapy and a history of hyponatremia on a ARB class drugs with a diuretic Mole check and non-healing skin lesions  1 suspicious mole on right shoulder one on the mid back one on the mid low back  ( 3)

## 2012-11-06 NOTE — Patient Instructions (Signed)
The patient is instructed to continue all medications as prescribed. Schedule followup with check out clerk upon leaving the clinic  

## 2012-11-09 ENCOUNTER — Other Ambulatory Visit: Payer: Self-pay | Admitting: *Deleted

## 2012-11-09 MED ORDER — FOLIC ACID-VIT B6-VIT B12 2.5-25-1 MG PO TABS: 1.0000 | ORAL_TABLET | Freq: Every day | ORAL | Status: DC

## 2012-11-20 ENCOUNTER — Other Ambulatory Visit: Payer: Self-pay | Admitting: *Deleted

## 2012-11-24 ENCOUNTER — Other Ambulatory Visit: Payer: Self-pay | Admitting: *Deleted

## 2012-11-24 MED ORDER — BENAZEPRIL-HYDROCHLOROTHIAZIDE 10-12.5 MG PO TABS: 1.0000 | ORAL_TABLET | Freq: Every day | ORAL | Status: DC

## 2012-12-08 ENCOUNTER — Other Ambulatory Visit: Payer: Self-pay | Admitting: *Deleted

## 2012-12-08 MED ORDER — MELOXICAM 15 MG PO TABS: 15.0000 mg | ORAL_TABLET | Freq: Every day | ORAL | Status: DC

## 2012-12-16 DIAGNOSIS — M79609 Pain in unspecified limb: Secondary | ICD-10-CM | POA: Diagnosis not present

## 2012-12-16 DIAGNOSIS — B351 Tinea unguium: Secondary | ICD-10-CM | POA: Diagnosis not present

## 2012-12-18 ENCOUNTER — Ambulatory Visit (INDEPENDENT_AMBULATORY_CARE_PROVIDER_SITE_OTHER): Payer: Medicare Other | Admitting: *Deleted

## 2012-12-18 ENCOUNTER — Ambulatory Visit: Payer: Medicare Other | Admitting: Internal Medicine

## 2012-12-18 DIAGNOSIS — Z23 Encounter for immunization: Secondary | ICD-10-CM

## 2012-12-21 ENCOUNTER — Other Ambulatory Visit: Payer: Self-pay | Admitting: *Deleted

## 2012-12-21 DIAGNOSIS — I10 Essential (primary) hypertension: Secondary | ICD-10-CM

## 2012-12-21 MED ORDER — AMLODIPINE BESYLATE 5 MG PO TABS: 5.0000 mg | ORAL_TABLET | Freq: Every day | ORAL | Status: DC

## 2013-01-19 ENCOUNTER — Other Ambulatory Visit: Payer: Self-pay | Admitting: *Deleted

## 2013-01-19 MED ORDER — MELOXICAM 15 MG PO TABS: 15.0000 mg | ORAL_TABLET | Freq: Every day | ORAL | Status: DC

## 2013-01-20 ENCOUNTER — Other Ambulatory Visit: Payer: Self-pay | Admitting: *Deleted

## 2013-01-20 MED ORDER — FAMOTIDINE 20 MG PO TABS: 20.0000 mg | ORAL_TABLET | Freq: Every day | ORAL | Status: DC

## 2013-01-21 ENCOUNTER — Other Ambulatory Visit: Payer: Self-pay | Admitting: *Deleted

## 2013-01-21 MED ORDER — FAMOTIDINE 20 MG PO TABS: 20.0000 mg | ORAL_TABLET | Freq: Every day | ORAL | Status: DC

## 2013-01-28 ENCOUNTER — Other Ambulatory Visit: Payer: Self-pay

## 2013-02-17 ENCOUNTER — Encounter: Payer: Self-pay | Admitting: Podiatry

## 2013-02-22 ENCOUNTER — Encounter: Payer: Self-pay | Admitting: Podiatry

## 2013-02-22 ENCOUNTER — Ambulatory Visit (INDEPENDENT_AMBULATORY_CARE_PROVIDER_SITE_OTHER): Payer: Medicare Other | Admitting: Podiatry

## 2013-02-22 VITALS — BP 142/66 | HR 71 | Resp 12 | Ht 73.0 in | Wt 200.0 lb

## 2013-02-22 DIAGNOSIS — M79609 Pain in unspecified limb: Secondary | ICD-10-CM

## 2013-02-22 DIAGNOSIS — B351 Tinea unguium: Secondary | ICD-10-CM

## 2013-02-22 NOTE — Progress Notes (Signed)
Patient ID: Darin [name], male   DOB: [birth year], 79 y.o.   MRN: [id number]  Subjective: This patient presents for ongoing debridement of painful mycotic toenails an approximately three-month intervals to is a patient in our practice since 2013.  Objective: Orientated x36 79 year old white male. Hypertrophic, elongated, discolored toenails x10 with palpable tenderness in all 10 nail plates.  Assessment: Symptomatic onychomycoses x10  Plan: Debridement of toenails x10 without any bleeding. Reappoint at three-month intervals.

## 2013-03-01 ENCOUNTER — Encounter: Payer: Self-pay | Admitting: Internal Medicine

## 2013-03-01 ENCOUNTER — Ambulatory Visit (INDEPENDENT_AMBULATORY_CARE_PROVIDER_SITE_OTHER): Payer: Medicare Other | Admitting: Internal Medicine

## 2013-03-01 VITALS — BP 130/80 | HR 76 | Temp 98.2°F | Resp 16 | Ht 73.0 in

## 2013-03-01 DIAGNOSIS — Z1283 Encounter for screening for malignant neoplasm of skin: Secondary | ICD-10-CM | POA: Diagnosis not present

## 2013-03-01 DIAGNOSIS — D239 Other benign neoplasm of skin, unspecified: Secondary | ICD-10-CM

## 2013-03-01 DIAGNOSIS — L739 Follicular disorder, unspecified: Secondary | ICD-10-CM

## 2013-03-01 DIAGNOSIS — D235 Other benign neoplasm of skin of trunk: Secondary | ICD-10-CM

## 2013-03-01 DIAGNOSIS — L738 Other specified follicular disorders: Secondary | ICD-10-CM | POA: Diagnosis not present

## 2013-03-01 DIAGNOSIS — D485 Neoplasm of uncertain behavior of skin: Secondary | ICD-10-CM | POA: Diagnosis not present

## 2013-03-01 DIAGNOSIS — L819 Disorder of pigmentation, unspecified: Secondary | ICD-10-CM

## 2013-03-01 MED ORDER — DOXYCYCLINE HYCLATE 100 MG PO TABS: 100.0000 mg | ORAL_TABLET | Freq: Two times a day (BID) | ORAL | Status: DC

## 2013-03-01 NOTE — Progress Notes (Signed)
Pre visit review using our clinic review tool, if applicable. No additional management support is needed unless otherwise documented below in the visit note. 

## 2013-03-01 NOTE — Progress Notes (Signed)
Patient is a 79 year old male who presents for a mole biopsy during his annual examination there were 2 suspicious moles identified on the upper back and lower back that had some characteristics that could be consistent with melanocytic change. Since stable afebrile patient gave informed consent for 2 shave biopsies Informed consent was given by the patient for a shave biopsy. The siteOn the upper back was prepped with Betadine and using a 15 blade a 1 cm shave biopsy was obtained. The specimin was placed in preservative and sent for pathology. Hemostasis was achieved with a compression. Wound care was discussed with the patient. The patient was informed that it would be one to 2 weeks before the pathology will be interpreted. Informed consent was given by the patient for a shave biopsy. The siteon the mid low back was prepped with Betadine and using a 15 blade a 1 cm shave biopsy was obtained. The specimin was placed in preservative and sent for pathology. Hemostasis was achieved with a compression. Wound care was discussed with the patient. The patient was informed that it would be one to 2 weeks before the pathology will be interpreted.

## 2013-03-01 NOTE — Patient Instructions (Signed)
Leave the dressing in place until you've showered. Place a dab of Neosporin and a new bandage until a scab appears over the biopsy site. After the scab appears there is no need to keep a dressing in place unless the area can be irritated by clothing. Call our office if redness around the biopsy site appears and begins to spread more than a half inch from the site.  

## 2013-03-01 NOTE — Addendum Note (Signed)
Addended by: Willy Eddy on: 03/01/2013 02:38 PM   Modules accepted: Orders

## 2013-03-31 ENCOUNTER — Ambulatory Visit: Payer: Self-pay | Admitting: Podiatry

## 2013-04-02 ENCOUNTER — Telehealth: Payer: Self-pay | Admitting: Internal Medicine

## 2013-04-02 NOTE — Telephone Encounter (Signed)
Can you call and sceduole

## 2013-04-02 NOTE — Telephone Encounter (Signed)
Done! Pt will be here monday

## 2013-04-02 NOTE — Telephone Encounter (Signed)
Could do it at 415 on monday

## 2013-04-02 NOTE — Telephone Encounter (Signed)
Please call and schedule

## 2013-04-02 NOTE — Telephone Encounter (Signed)
Pt will be traveling out of the country and would like a cortisone inj in his knee prior to his tripp. Pt states Dr Arnoldo Morale does this for him, pls advise Pt leaving a week from [facility]

## 2013-04-05 ENCOUNTER — Encounter: Payer: Self-pay | Admitting: Internal Medicine

## 2013-04-05 ENCOUNTER — Ambulatory Visit (INDEPENDENT_AMBULATORY_CARE_PROVIDER_SITE_OTHER): Payer: Medicare Other | Admitting: Internal Medicine

## 2013-04-05 VITALS — BP 130/80 | HR 72 | Temp 98.3°F | Resp 16 | Ht 73.0 in | Wt 200.0 lb

## 2013-04-05 DIAGNOSIS — J019 Acute sinusitis, unspecified: Secondary | ICD-10-CM | POA: Diagnosis not present

## 2013-04-05 DIAGNOSIS — M1710 Unilateral primary osteoarthritis, unspecified knee: Secondary | ICD-10-CM

## 2013-04-05 MED ORDER — METHYLPREDNISOLONE ACETATE 40 MG/ML IJ SUSP: 40.0000 mg | Freq: Once | INTRAMUSCULAR | Status: DC

## 2013-04-05 MED ORDER — AZITHROMYCIN 250 MG PO TABS: ORAL_TABLET | ORAL | Status: DC

## 2013-04-05 NOTE — Progress Notes (Signed)
Subjective:    Patient ID: Darin [name], male    DOB: 02-07-1932, 80 y.o.   MRN: [id number]  HPI Increased right knee pain with history of severe OA    Review of Systems  Constitutional: Negative for fever and fatigue.  HENT: Negative for congestion, hearing loss and postnasal drip.   Eyes: Negative for discharge, redness and visual disturbance.  Respiratory: Negative for cough, shortness of breath and wheezing.   Cardiovascular: Negative for leg swelling.  Gastrointestinal: Negative for abdominal pain, constipation and abdominal distention.  Genitourinary: Negative for urgency and frequency.  Musculoskeletal: Negative for arthralgias, joint swelling and neck pain.  Skin: Negative for color change and rash.  Neurological: Negative for weakness and light-headedness.  Hematological: Negative for adenopathy.  Psychiatric/Behavioral: Negative for behavioral problems.   Past Medical History  Diagnosis Date  . GERD (gastroesophageal reflux disease)   . Hyperlipidemia   . Hypertension   . Arthritis   . PMR (polymyalgia rheumatica)   . Knee pain   . Osteoporosis   . H/O hiatal hernia     "FOR YEARS" " NO PROBLEM"  . Neuropathy     PERIPHERAL   . Polymyalgia 5 YEARS    History   Social History  . Marital Status: Widowed    Spouse Name: N/A    Number of Children: N/A  . Years of Education: N/A   Occupational History  . retired    Social History Main Topics  . Smoking status: Never Smoker   . Smokeless tobacco: Never Used  . Alcohol Use: Yes     Comment: in moderate amounts  . Drug Use: No  . Sexual Activity: Yes   Other Topics Concern  . Not on file   Social History Narrative  . No narrative on file    Past Surgical History  Procedure Laterality Date  . Tonsillectomy    . Carpal tunnel release    . Varicose vein surgery    . Cataract extraction      right  . Inguinal hernia repair  10/14/2011    Procedure: HERNIA REPAIR INGUINAL ADULT;  Surgeon:  [name], MD;  Location: [place];  Service: General;  Laterality: Right;  Right Inguinal Hernia Repair with Mesh    Family History  Problem Relation Age of Onset  . Coronary artery disease    . Heart disease Father     Allergies  Allergen Reactions  . Statins Other (See Comments)    Muscle pain and upset stomach    Current Outpatient Prescriptions on File Prior to Visit  Medication Sig Dispense Refill  . amLODipine (NORVASC) 5 MG tablet Take 1 tablet (5 mg total) by mouth daily.  90 tablet  3  . aspirin 81 MG tablet Take 81 mg by mouth at bedtime.       . benazepril-hydrochlorthiazide (LOTENSIN HCT) 10-12.5 MG per tablet Take 1 tablet by mouth daily.  90 tablet  3  . Calcium Carb-Cholecalciferol (CALCIUM 500 +D) 500-400 MG-UNIT TABS Take by mouth 2 (two) times daily.        Marland Kitchen doxycycline (VIBRA-TABS) 100 MG tablet Take 1 tablet (100 mg total) by mouth 2 (two) times daily.  28 tablet  0  . EPINEPHrine (EPI-PEN) 0.3 mg/0.3 mL DEVI Inject 0.3 mLs (0.3 mg total) into the muscle once.  1 Device  3  . famotidine (PEPCID) 20 MG tablet Take 1 tablet (20 mg total) by mouth at bedtime.  90 tablet  3  .  LEVITRA 20 MG tablet TAKE 1 TABLET DAILY AS NEEDED.  8 tablet  0  . meloxicam (MOBIC) 15 MG tablet Take 1 tablet (15 mg total) by mouth daily.  90 tablet  3  . niacin 500 MG tablet Take 500 mg by mouth 2 (two) times daily with a meal.       . polyethylene glycol (MIRALAX / GLYCOLAX) packet Take 17 g by mouth 2 (two) times daily.        . saw palmetto 80 MG capsule Take 80 mg by mouth daily.       Marland Kitchen tiZANidine (ZANAFLEX) 4 MG tablet Take 1 tablet (4 mg total) by mouth every 6 (six) hours as needed.  30 tablet  0  . vitamin C (ASCORBIC ACID) 500 MG tablet Take 500 mg by mouth daily.       No current facility-administered medications on file prior to visit.    BP 130/80  Pulse 72  Temp(Src) 98.3 F (36.8 C)  Resp 16  Ht 6\' 1"  (1.854 m)  Wt 200 lb (90.719 kg)  BMI 26.39  kg/m2       Objective:   Physical Exam  Constitutional: He appears well-developed and well-nourished.  HENT:  Head: Normocephalic and atraumatic.  Eyes: Conjunctivae are normal. Pupils are equal, round, and reactive to light.  Neck: Normal range of motion. Neck supple.  Cardiovascular: Normal rate and regular rhythm.   Pulmonary/Chest: Effort normal and breath sounds normal.  Abdominal: Soft. Bowel sounds are normal.          Assessment & Plan:   Informed consent obtained and the patient's knee was prepped with betadine. Local anesthesia was obtained with topical spray. Then 40 mg of Depo-Medrol and 1/2 cc of lidocaine was injected into the joint space. The patient tolerated the procedure without complications. Post injection care discussed with patient.right knee

## 2013-05-24 ENCOUNTER — Ambulatory Visit: Payer: Medicare Other | Admitting: Podiatry

## 2013-06-09 ENCOUNTER — Ambulatory Visit (INDEPENDENT_AMBULATORY_CARE_PROVIDER_SITE_OTHER): Payer: Medicare Other | Admitting: Podiatry

## 2013-06-09 ENCOUNTER — Encounter: Payer: Self-pay | Admitting: Podiatry

## 2013-06-09 VITALS — BP 148/75 | HR 70 | Resp 18

## 2013-06-09 DIAGNOSIS — M79609 Pain in unspecified limb: Secondary | ICD-10-CM | POA: Diagnosis not present

## 2013-06-09 DIAGNOSIS — B351 Tinea unguium: Secondary | ICD-10-CM

## 2013-06-09 NOTE — Progress Notes (Signed)
Subjective: This patient presents for ongoing debridement of painful mycotic toenails an approximately three-month intervals to is a patient in our practice since 2013.   Objective: Orientated x5 80 year old white male. Hypertrophic, elongated, discolored toenails x10 with palpable tenderness in all 10 nail plates.   Assessment: Symptomatic onychomycoses x10   Plan: Debridement of toenails x10 without any bleeding. Reappoint at three-month intervals.

## 2013-07-19 ENCOUNTER — Encounter: Payer: Self-pay | Admitting: Internal Medicine

## 2013-07-19 ENCOUNTER — Ambulatory Visit (INDEPENDENT_AMBULATORY_CARE_PROVIDER_SITE_OTHER): Payer: Medicare Other | Admitting: Internal Medicine

## 2013-07-19 VITALS — BP 140/60 | HR 84 | Temp 98.2°F | Wt 210.0 lb

## 2013-07-19 DIAGNOSIS — N401 Enlarged prostate with lower urinary tract symptoms: Secondary | ICD-10-CM

## 2013-07-19 DIAGNOSIS — N138 Other obstructive and reflux uropathy: Secondary | ICD-10-CM | POA: Diagnosis not present

## 2013-07-19 DIAGNOSIS — R351 Nocturia: Secondary | ICD-10-CM

## 2013-07-19 DIAGNOSIS — M1710 Unilateral primary osteoarthritis, unspecified knee: Secondary | ICD-10-CM

## 2013-07-19 MED ORDER — FINASTERIDE 5 MG PO TABS
5.0000 mg | ORAL_TABLET | Freq: Every day | ORAL | Status: DC
Start: 2013-07-19 — End: 2013-07-19

## 2013-07-19 MED ORDER — FINASTERIDE 5 MG PO TABS: 5.0000 mg | ORAL_TABLET | Freq: Every day | ORAL | Status: DC

## 2013-07-19 NOTE — Patient Instructions (Signed)
The patient is instructed to continue all medications as prescribed. Schedule followup with check out clerk upon leaving the clinic  

## 2013-07-19 NOTE — Progress Notes (Signed)
Subjective:    Patient ID: Darin [name], male    DOB: Jul 03, 1931, 80 y.o.   MRN: [id number]  HPI  [place] Stable severe DJD increased urintary retention at night  Review of Systems  Constitutional: Positive for fatigue. Negative for appetite change.  HENT: Positive for rhinorrhea. Negative for nosebleeds and postnasal drip.   Eyes: Negative.   Respiratory: Negative.   Cardiovascular: Negative.   Genitourinary: Positive for frequency.       Stream narrow and week at night  Musculoskeletal: Positive for arthralgias, gait problem, myalgias and neck stiffness.  Neurological: Positive for numbness.   Past Medical History  Diagnosis Date  . GERD (gastroesophageal reflux disease)   . Hyperlipidemia   . Hypertension   . Arthritis   . PMR (polymyalgia rheumatica)   . Knee pain   . Osteoporosis   . H/O hiatal hernia     "FOR YEARS" " NO PROBLEM"  . Neuropathy     PERIPHERAL   . Polymyalgia 5 YEARS    History   Social History  . Marital Status: Widowed    Spouse Name: N/A    Number of Children: N/A  . Years of Education: N/A   Occupational History  . retired    Social History Main Topics  . Smoking status: Never Smoker   . Smokeless tobacco: Never Used  . Alcohol Use: Yes     Comment: in moderate amounts  . Drug Use: No  . Sexual Activity: Yes   Other Topics Concern  . Not on file   Social History Narrative  . No narrative on file    Past Surgical History  Procedure Laterality Date  . Tonsillectomy    . Carpal tunnel release    . Varicose vein surgery    . Cataract extraction      right  . Inguinal hernia repair  10/14/2011    Procedure: HERNIA REPAIR INGUINAL ADULT;  Surgeon: [name], MD;  Location: [place];  Service: General;  Laterality: Right;  Right Inguinal Hernia Repair with Mesh    Family History  Problem Relation Age of Onset  . Coronary artery disease    . Heart disease Father     Allergies    Allergen Reactions  . Statins Other (See Comments)    Muscle pain and upset stomach    Current Outpatient Prescriptions on File Prior to Visit  Medication Sig Dispense Refill  . amLODipine (NORVASC) 5 MG tablet Take 1 tablet (5 mg total) by mouth daily.  90 tablet  3  . aspirin 81 MG tablet Take 81 mg by mouth at bedtime.       . benazepril-hydrochlorthiazide (LOTENSIN HCT) 10-12.5 MG per tablet Take 1 tablet by mouth daily.  90 tablet  3  . Calcium Carb-Cholecalciferol (CALCIUM 500 +D) 500-400 MG-UNIT TABS Take by mouth 2 (two) times daily.        . famotidine (PEPCID) 20 MG tablet Take 1 tablet (20 mg total) by mouth at bedtime.  90 tablet  3  . LEVITRA 20 MG tablet TAKE 1 TABLET DAILY AS NEEDED.  8 tablet  0  . meloxicam (MOBIC) 15 MG tablet Take 1 tablet (15 mg total) by mouth daily.  90 tablet  3  . niacin 500 MG tablet Take 500 mg by mouth 2 (two) times daily with a meal.       . polyethylene glycol (MIRALAX / GLYCOLAX) packet Take 17 g by  mouth 2 (two) times daily.        . saw palmetto 80 MG capsule Take 80 mg by mouth daily.       . vitamin C (ASCORBIC ACID) 500 MG tablet Take 500 mg by mouth daily.      Marland Kitchen EPINEPHrine (EPI-PEN) 0.3 mg/0.3 mL DEVI Inject 0.3 mLs (0.3 mg total) into the muscle once.  1 Device  3  . tiZANidine (ZANAFLEX) 4 MG tablet Take 1 tablet (4 mg total) by mouth every 6 (six) hours as needed.  30 tablet  0   No current facility-administered medications on file prior to visit.    BP 140/60  Pulse 84  Temp(Src) 98.2 F (36.8 C) (Oral)  Wt 210 lb (95.255 kg)        Objective:   Physical Exam  Constitutional: He appears well-developed and well-nourished.  Eyes: Conjunctivae and EOM are normal.  Neck: No JVD present. No tracheal deviation present. No thyromegaly present.  Cardiovascular: Normal rate and regular rhythm.   Murmur heard. Pulmonary/Chest: Effort normal and breath sounds normal.  Genitourinary:  Enlarged prostate  Musculoskeletal: He  exhibits edema and tenderness.  Neurological: He is alert.  Psychiatric: He has a normal mood and affect. His behavior is normal.          Assessment & Plan:  Prostatism with narrow stream at night  change to proscar Wants to change to Dr  Here at [facility] Severe DJD of knees but does not want replacement  Has a trip to [place] in June! TD due today   Shingles vaccine at Pharmacy

## 2013-07-30 ENCOUNTER — Telehealth: Payer: Self-pay | Admitting: Internal Medicine

## 2013-07-30 NOTE — Telephone Encounter (Signed)
OK 

## 2013-07-30 NOTE — Telephone Encounter (Signed)
Pt would like to know if you will accept him as a pt? Pt states dr Arnoldo Morale told him you would and pt calling back to find out why we have not called,  I do not see a phone note on this. pls advise.

## 2013-08-05 NOTE — Telephone Encounter (Signed)
Pt aware.

## 2013-09-01 ENCOUNTER — Encounter: Payer: Self-pay | Admitting: Podiatry

## 2013-09-01 ENCOUNTER — Ambulatory Visit (INDEPENDENT_AMBULATORY_CARE_PROVIDER_SITE_OTHER): Payer: Medicare Other | Admitting: Podiatry

## 2013-09-01 VITALS — BP 157/76 | HR 74 | Resp 12

## 2013-09-01 DIAGNOSIS — M79609 Pain in unspecified limb: Secondary | ICD-10-CM

## 2013-09-01 DIAGNOSIS — B351 Tinea unguium: Secondary | ICD-10-CM

## 2013-09-01 NOTE — Patient Instructions (Signed)
Remove bandage on fourth right toe in 24 hours and apply topical antibiotic ointment daily until healed

## 2013-09-01 NOTE — Progress Notes (Signed)
Patient ID: Darin [name], male   DOB: 1931-07-21, 80 y.o.   MRN: [id number]  Subjective:  Orientated x3 white male presents complaining of painful toenails  Objective: Elongated, brittle, incurvated, hypertrophic toenails x10  Assessment: Symptomatic onychomycoses x10 plan:  Plan:  Debrided toenails x10 Pinpoint bleeding distal fourth right toe with application of topical antibiotic ointment and bandage Patient advised to remove bandage in 24 hours and apply topical antibiotic ointment to the fourth right toe daily until healed.  Reappoint x3 months

## 2013-09-30 ENCOUNTER — Telehealth: Payer: Self-pay | Admitting: Family Medicine

## 2013-09-30 ENCOUNTER — Encounter: Payer: Self-pay | Admitting: Family Medicine

## 2013-09-30 ENCOUNTER — Ambulatory Visit (INDEPENDENT_AMBULATORY_CARE_PROVIDER_SITE_OTHER): Payer: Medicare Other | Admitting: Family Medicine

## 2013-09-30 VITALS — BP 130/68 | HR 76 | Temp 98.1°F | Wt 214.0 lb

## 2013-09-30 DIAGNOSIS — IMO0002 Reserved for concepts with insufficient information to code with codable children: Secondary | ICD-10-CM

## 2013-09-30 DIAGNOSIS — M5416 Radiculopathy, lumbar region: Secondary | ICD-10-CM

## 2013-09-30 MED ORDER — PREDNISONE 10 MG PO TABS: ORAL_TABLET | ORAL | Status: DC

## 2013-09-30 NOTE — Progress Notes (Signed)
   Subjective:    Patient ID: Darin [name], male    DOB: May 09, 1931, 80 y.o.   MRN: [id number]  Back Pain Pertinent negatives include no abdominal pain, chest pain, dysuria, fever, numbness or weakness.   Acute visit. Patient has recurrence of right lumbar radiculopathy pains. He had similar symptoms the past and had epidural back in 2013. MRI previously has shown significant arthritic change with foraminal stenosis especially L4-L5 level. Current symptoms started about one half weeks ago. No known injury. Pain is dull ache. Radiation all way to the foot. No numbness or weakness. No urine or stool incontinence. Severity is 7-8/10. No alleviating factors. He has not taken any medications. Worse with walking but sometimes at rest. No fevers or chills. No appetite or weight changes.  Past Medical History  Diagnosis Date  . GERD (gastroesophageal reflux disease)   . Hyperlipidemia   . Hypertension   . Arthritis   . PMR (polymyalgia rheumatica)   . Knee pain   . Osteoporosis   . H/O hiatal hernia     "FOR YEARS" " NO PROBLEM"  . Neuropathy     PERIPHERAL   . Polymyalgia 5 YEARS   Past Surgical History  Procedure Laterality Date  . Tonsillectomy    . Carpal tunnel release    . Varicose vein surgery    . Cataract extraction      right  . Inguinal hernia repair  10/14/2011    Procedure: HERNIA REPAIR INGUINAL ADULT;  Surgeon: [name], MD;  Location: [place];  Service: General;  Laterality: Right;  Right Inguinal Hernia Repair with Mesh    reports that he has never smoked. He has never used smokeless tobacco. He reports that he drinks alcohol. He reports that he does not use illicit drugs. family history includes Coronary artery disease in an other family member; Heart disease in his father. Allergies  Allergen Reactions  . Statins Other (See Comments)    Muscle pain and upset stomach      Review of Systems  Constitutional: Negative for fever, activity change, appetite  change and unexpected weight change.  Respiratory: Negative for cough and shortness of breath.   Cardiovascular: Negative for chest pain and leg swelling.  Gastrointestinal: Negative for vomiting and abdominal pain.  Genitourinary: Negative for dysuria, hematuria and flank pain.  Musculoskeletal: Positive for back pain. Negative for joint swelling.  Neurological: Negative for weakness and numbness.       Objective:   Physical Exam  Constitutional: He appears well-developed and well-nourished. No distress.  Cardiovascular: Normal rate and regular rhythm.   Pulmonary/Chest: Effort normal and breath sounds normal. No respiratory distress. He has no wheezes. He has no rales.  Musculoskeletal: He exhibits no edema.  Straight leg raises are negative bilaterally  Neurological:  Full-strength lower extremities. Trace reflexes knee and ankle bilaterally. No sensory deficits.          Assessment & Plan:  Chronic intermittent right lumbar radiculopathy-exacerbation of chronic problem. Current symptoms are right radiculopathy with nonfocal neurologic exam. He had previous epidurals. Trial of prednisone taper over the next week-discussed possible side effects. If not improving may need MRI to reassess.

## 2013-09-30 NOTE — Patient Instructions (Signed)

## 2013-09-30 NOTE — Telephone Encounter (Signed)
Noted  

## 2013-09-30 NOTE — Progress Notes (Signed)
Pre visit review using our clinic review tool, if applicable. No additional management support is needed unless otherwise documented below in the visit note. 

## 2013-09-30 NOTE — Telephone Encounter (Signed)
Patient Information:  Caller Name: Oniel  Phone: [phone]  Patient: Darin [name], Darin [name]  Gender: Male  DOB: November 08, 1931  Age: 80 Years  PCP: [name] (Family Practice)  Office Follow Up:  Does the office need to follow up with this patient?: No  Instructions For The Office: N/A   Symptoms  Reason For Call & Symptoms: Sciatica pain - back pain radiating into the right leg. Able to ambulate, but not like normal due to pain. Pain radiates all the way down the right leg to the right foot.  Reviewed Health History In EMR: Yes  Reviewed Medications In EMR: Yes  Reviewed Allergies In EMR: Yes  Reviewed Surgeries / Procedures: Yes  Date of Onset of Symptoms: 09/21/2013  Guideline(s) Used:  Back Pain  Disposition Per Guideline:   See Today or Tomorrow in Office  Reason For Disposition Reached:   Pain radiates into the thigh or further down the leg  Advice Given:  N/A  Patient Will Follow Care Advice:  YES  Appointment Scheduled:  09/30/2013 09:45:00 Appointment Scheduled Provider:  [name] (Family Practice)

## 2013-11-08 ENCOUNTER — Telehealth: Payer: Self-pay | Admitting: Family Medicine

## 2013-11-08 MED ORDER — BENAZEPRIL-HYDROCHLOROTHIAZIDE 10-12.5 MG PO TABS: 1.0000 | ORAL_TABLET | Freq: Every day | ORAL | Status: DC

## 2013-11-08 NOTE — Telephone Encounter (Signed)
Pt is needing new 90 day supply rx benazepril-hydrochlorthiazide (LOTENSIN HCT) 10-12.5 MG per tablet [facility].

## 2013-11-08 NOTE — Telephone Encounter (Signed)
Rx sent to pharmacy   

## 2013-11-16 ENCOUNTER — Ambulatory Visit (INDEPENDENT_AMBULATORY_CARE_PROVIDER_SITE_OTHER): Payer: Medicare Other | Admitting: Family Medicine

## 2013-11-16 ENCOUNTER — Encounter: Payer: Self-pay | Admitting: Family Medicine

## 2013-11-16 VITALS — BP 130/68 | HR 75 | Temp 97.9°F | Wt 219.0 lb

## 2013-11-16 DIAGNOSIS — M1990 Unspecified osteoarthritis, unspecified site: Secondary | ICD-10-CM | POA: Diagnosis not present

## 2013-11-16 DIAGNOSIS — E785 Hyperlipidemia, unspecified: Secondary | ICD-10-CM

## 2013-11-16 DIAGNOSIS — H6120 Impacted cerumen, unspecified ear: Secondary | ICD-10-CM

## 2013-11-16 DIAGNOSIS — Z23 Encounter for immunization: Secondary | ICD-10-CM

## 2013-11-16 DIAGNOSIS — I10 Essential (primary) hypertension: Secondary | ICD-10-CM

## 2013-11-16 DIAGNOSIS — H6123 Impacted cerumen, bilateral: Secondary | ICD-10-CM

## 2013-11-16 LAB — BASIC METABOLIC PANEL
BUN: 19 mg/dL (ref 6–23)
CALCIUM: 9.4 mg/dL (ref 8.4–10.5)
CO2: 32 mEq/L (ref 19–32)
CREATININE: 0.9 mg/dL (ref 0.4–1.5)
Chloride: 97 mEq/L (ref 96–112)
GFR: 89.71 mL/min (ref >60.00)
Glucose, Bld: 65 mg/dL — ABNORMAL LOW (ref 70–99)
Potassium: 3.8 mEq/L (ref 3.5–5.1)
Sodium: 135 mEq/L (ref 135–145)

## 2013-11-16 NOTE — Addendum Note (Signed)
Addended by: Elmer Picker on: 11/16/2013 10:15 AM   Modules accepted: Orders

## 2013-11-16 NOTE — Addendum Note (Signed)
Addended by: Marcina Millard on: 11/16/2013 09:46 AM   Modules accepted: Orders

## 2013-11-16 NOTE — Progress Notes (Signed)
Subjective:    Patient ID: Darin [name], male    DOB: 04-16-1931, 80 y.o.   MRN: [id number]  HPI Patient seen for medical followup. His chronic problems include history of hypertension, hyperlipidemia but with excellent HDL, osteoarthritis, GERD. Medications reviewed. Compliant with all. No dizziness. No headaches. No chest pains.  Bilateral ear fullness. History of cerumen impaction in the past. No ear drainage. No vertigo.  Patient declines flu vaccine. He does agree to [employer] 13.  History of basal cell skin cancer. Scaly lesion anterior mid aspect of nose. No bleeding. No itching.  Past Medical History  Diagnosis Date  . GERD (gastroesophageal reflux disease)   . Hyperlipidemia   . Hypertension   . Arthritis   . PMR (polymyalgia rheumatica)   . Knee pain   . Osteoporosis   . H/O hiatal hernia     "FOR YEARS" " NO PROBLEM"  . Neuropathy     PERIPHERAL   . Polymyalgia 5 YEARS   Past Surgical History  Procedure Laterality Date  . Tonsillectomy    . Carpal tunnel release    . Varicose vein surgery    . Cataract extraction      right  . Inguinal hernia repair  10/14/2011    Procedure: HERNIA REPAIR INGUINAL ADULT;  Surgeon: [name], MD;  Location: [place];  Service: General;  Laterality: Right;  Right Inguinal Hernia Repair with Mesh    reports that he has never smoked. He has never used smokeless tobacco. He reports that he drinks alcohol. He reports that he does not use illicit drugs. family history includes Coronary artery disease in an other family member; Heart disease in his father. Allergies  Allergen Reactions  . Statins Other (See Comments)    Muscle pain and upset stomach      Review of Systems  Constitutional: Negative for fatigue.  Eyes: Negative for visual disturbance.  Respiratory: Negative for cough, chest tightness and shortness of breath.   Cardiovascular: Negative for chest pain, palpitations and leg swelling.  Endocrine: Negative for  polydipsia and polyuria.  Musculoskeletal: Positive for arthralgias and back pain.  Neurological: Negative for dizziness, syncope, weakness, light-headedness and headaches.       Objective:   Physical Exam  Constitutional: He is oriented to person, place, and time. He appears well-developed and well-nourished.  HENT:  Right Ear: External ear normal.  Left Ear: External ear normal.  Mouth/Throat: Oropharynx is clear and moist.  Eyes: Pupils are equal, round, and reactive to light.  Neck: Neck supple. No thyromegaly present.  Cardiovascular: Normal rate and regular rhythm.   Pulmonary/Chest: Effort normal and breath sounds normal. No respiratory distress. He has no wheezes. He has no rales.  Musculoskeletal: He exhibits no edema.  Neurological: He is alert and oriented to person, place, and time.  Skin:  Hyperkeratotic anterior nasal lesion. No nodular qualities. No ulcerations.          Assessment & Plan:  #1 hypertension. Adequately controlled. Continue current medications. Check basic metabolic panel #2 history of osteoarthritis and question of lumbar stenosis. Symptomatically stable. #3 hyperlipidemia but excellent HDL. He's had previous HDL 100. We have not recommended yearly lipid testing #4 bilateral cerumen impaction. Irrigation #5 health maintenance. Recommended flu vaccine and he declines. He does agree to [employer] 13. #6 hyperkeratotic lesion anterior nose. Question actinic keratoses. Recommend treatment with liquid nitrogen and patient declines at this time but agrees to have this treated at followup visit in a few months

## 2013-11-16 NOTE — Progress Notes (Signed)
Pre visit review using our clinic review tool, if applicable. No additional management support is needed unless otherwise documented below in the visit note. 

## 2013-11-17 ENCOUNTER — Telehealth: Payer: Self-pay | Admitting: Family Medicine

## 2013-11-17 NOTE — Telephone Encounter (Signed)
Relevant patient education assigned to patient using Emmi. ° °

## 2013-12-01 ENCOUNTER — Encounter: Payer: Self-pay | Admitting: Podiatry

## 2013-12-01 ENCOUNTER — Ambulatory Visit (INDEPENDENT_AMBULATORY_CARE_PROVIDER_SITE_OTHER): Payer: Medicare Other | Admitting: Podiatry

## 2013-12-01 VITALS — BP 139/72 | HR 77 | Resp 12

## 2013-12-01 DIAGNOSIS — M79609 Pain in unspecified limb: Secondary | ICD-10-CM

## 2013-12-01 DIAGNOSIS — M79676 Pain in unspecified toe(s): Secondary | ICD-10-CM

## 2013-12-01 DIAGNOSIS — B351 Tinea unguium: Secondary | ICD-10-CM

## 2013-12-02 NOTE — Progress Notes (Signed)
Patient ID: Darin [name], male   DOB: 12-23-1931, 80 y.o.   MRN: [id number]  Subjective: This patient presents today complaining of painful toenails  Objective: Hypertrophic, elongated, discolored toenails 6-10  Assessment: Symptomatic onychomycoses 6-10  Plan: Nails x10 are debrided without a bleeding  Reappoint x3 months

## 2013-12-21 DIAGNOSIS — Z961 Presence of intraocular lens: Secondary | ICD-10-CM | POA: Diagnosis not present

## 2013-12-21 DIAGNOSIS — I10 Essential (primary) hypertension: Secondary | ICD-10-CM | POA: Diagnosis not present

## 2013-12-21 DIAGNOSIS — H35379 Puckering of macula, unspecified eye: Secondary | ICD-10-CM | POA: Diagnosis not present

## 2013-12-21 DIAGNOSIS — H519 Unspecified disorder of binocular movement: Secondary | ICD-10-CM | POA: Diagnosis not present

## 2014-01-03 ENCOUNTER — Telehealth: Payer: Self-pay | Admitting: Family Medicine

## 2014-01-03 DIAGNOSIS — I10 Essential (primary) hypertension: Secondary | ICD-10-CM

## 2014-01-03 MED ORDER — AMLODIPINE BESYLATE 5 MG PO TABS: 5.0000 mg | ORAL_TABLET | Freq: Every day | ORAL | Status: DC

## 2014-01-03 MED ORDER — PREDNISONE 10 MG PO TABS: ORAL_TABLET | ORAL | Status: DC

## 2014-01-03 NOTE — Telephone Encounter (Signed)
Is it okay to send in prednisone.

## 2014-01-03 NOTE — Telephone Encounter (Signed)
May refill prednisone once only.

## 2014-01-03 NOTE — Telephone Encounter (Signed)
Pt needs refill on  amlodipine 5 mg #90 w/refills [facility]. Pt also requesting a refill on prednisone call [facility] for back pain radiating to right leg. Pt decline to make ov

## 2014-01-03 NOTE — Telephone Encounter (Signed)
Rx's sent to pharmacy.  

## 2014-01-07 ENCOUNTER — Other Ambulatory Visit: Payer: Self-pay

## 2014-01-31 ENCOUNTER — Telehealth: Payer: Self-pay | Admitting: Family Medicine

## 2014-01-31 DIAGNOSIS — R351 Nocturia: Principal | ICD-10-CM

## 2014-01-31 DIAGNOSIS — N401 Enlarged prostate with lower urinary tract symptoms: Secondary | ICD-10-CM

## 2014-01-31 MED ORDER — FAMOTIDINE 20 MG PO TABS: 20.0000 mg | ORAL_TABLET | Freq: Every day | ORAL | Status: DC

## 2014-01-31 MED ORDER — FINASTERIDE 5 MG PO TABS: 5.0000 mg | ORAL_TABLET | Freq: Every day | ORAL | Status: DC

## 2014-01-31 NOTE — Telephone Encounter (Signed)
Rx sent to mail order

## 2014-01-31 NOTE — Telephone Encounter (Signed)
Pt needs 90 DS of finisteride 5mg  and famotidine 20mg  sent to [facility] please.

## 2014-02-18 DIAGNOSIS — H40013 Open angle with borderline findings, low risk, bilateral: Secondary | ICD-10-CM | POA: Diagnosis not present

## 2014-02-18 DIAGNOSIS — H1131 Conjunctival hemorrhage, right eye: Secondary | ICD-10-CM | POA: Diagnosis not present

## 2014-02-24 ENCOUNTER — Ambulatory Visit (INDEPENDENT_AMBULATORY_CARE_PROVIDER_SITE_OTHER): Payer: Medicare Other | Admitting: Family Medicine

## 2014-02-24 ENCOUNTER — Telehealth: Payer: Self-pay | Admitting: Family Medicine

## 2014-02-24 ENCOUNTER — Encounter: Payer: Self-pay | Admitting: Family Medicine

## 2014-02-24 VITALS — BP 120/70 | Temp 97.3°F | Wt 219.0 lb

## 2014-02-24 DIAGNOSIS — M5126 Other intervertebral disc displacement, lumbar region: Secondary | ICD-10-CM

## 2014-02-24 MED ORDER — PREDNISONE 20 MG PO TABS: ORAL_TABLET | ORAL | Status: DC

## 2014-02-24 NOTE — Progress Notes (Signed)
   Subjective:    Patient ID: Darin [name], male    DOB: 1931-08-10, 80 y.o.   MRN: [id number]  HPI [name] is an 80 year old male nonsmoker who comes in today for evaluation of recurrent back pain  He is a patient of Dr. Arnoldo Morale who now is a patient of Dr. Elease Hashimoto  He tells me same recurrent back problems for many years. A couple years ago he thinks it was 3 he had an epidural steroid injection under the direction of his neurosurgeon Dr. Joya Salm.  He recently saw Dr. Elease Hashimoto was given a dose of prednisone it cleared his back pain up and now is back saying he needs more prednisone  He denies any neurologic symptoms   Review of Systems    review of systems otherwise negative no bowel or bladder dysfunction Objective:   Physical Exam  Well-developed well-nourished male no acute distress vital signs stable he is afebrile on the standing position inspection spine shows obvious scoliosis about 20. He has tenderness at the right SI notch. Neurologic exam otherwise normal      Assessment & Plan:  Right SI joint pain secondary to anatomic abnormality,,,,, scoliosis,,,,,,, because of recurrent pain advised to go back and see his neurosurgeon. We'll give him a short course of prednisone but he really needs to go back and see them and have an epidural steroid injection like he did 3 years ago which kept him pain-free for almost copy to 3 years

## 2014-02-24 NOTE — Telephone Encounter (Signed)
Pt informed that Dr. B is out of the office. Pt is okay with seeing Dr. Sherren Mocha today at 4:00pm

## 2014-02-24 NOTE — Telephone Encounter (Signed)
Patient states his sciatica is bothering his leg again.  He would like to know if he can have predniSONE (DELTASONE) 10 MG tablet called to [facility]??  He is scheduled to see Dr. Elease Hashimoto on 03/08/14.  Patient would like a callback.

## 2014-02-24 NOTE — Patient Instructions (Signed)
Prednisone 20 mg............. 3 tablets now........... Starting tomorrow morning 2 tabs every morning for 3 days taper as outlined  Call [facility]........ [phone]......Marland Kitchen Asked to see your neurosurgeon as soon as possible for further evaluation

## 2014-02-24 NOTE — Progress Notes (Signed)
Pre visit review using our clinic review tool, if applicable. No additional management support is needed unless otherwise documented below in the visit note. 

## 2014-02-28 ENCOUNTER — Telehealth: Payer: Self-pay | Admitting: Family Medicine

## 2014-02-28 DIAGNOSIS — M5126 Other intervertebral disc displacement, lumbar region: Secondary | ICD-10-CM

## 2014-02-28 MED ORDER — BENAZEPRIL-HYDROCHLOROTHIAZIDE 10-12.5 MG PO TABS: 1.0000 | ORAL_TABLET | Freq: Every day | ORAL | Status: DC

## 2014-02-28 NOTE — Telephone Encounter (Signed)
Pt saw dr Sherren Mocha on 12/3. Was told to go to [facility] for epidural spinal injection. Pt needs referral. Can you put in?

## 2014-02-28 NOTE — Telephone Encounter (Signed)
i recommend he see neurosurgeon and they can decide if epidural might help- this was also Dr Honor Junes recommendation per note.  Would have him see Dr Joya Salm.

## 2014-02-28 NOTE — Telephone Encounter (Signed)
Okay to refer? 

## 2014-02-28 NOTE — Telephone Encounter (Signed)
Pt request refill of the following: benazepril-hydrochlorthiazide (LOTENSIN HCT) 10-12.5 MG per tablet   Phamacy: [facility]

## 2014-02-28 NOTE — Telephone Encounter (Signed)
Rx sent to mail order

## 2014-03-02 ENCOUNTER — Ambulatory Visit (INDEPENDENT_AMBULATORY_CARE_PROVIDER_SITE_OTHER): Payer: Medicare Other | Admitting: Podiatry

## 2014-03-02 ENCOUNTER — Encounter: Payer: Self-pay | Admitting: Podiatry

## 2014-03-02 ENCOUNTER — Other Ambulatory Visit: Payer: Self-pay | Admitting: Family Medicine

## 2014-03-02 VITALS — BP 126/86 | HR 82 | Resp 12

## 2014-03-02 DIAGNOSIS — B351 Tinea unguium: Secondary | ICD-10-CM

## 2014-03-02 DIAGNOSIS — M5126 Other intervertebral disc displacement, lumbar region: Secondary | ICD-10-CM

## 2014-03-02 DIAGNOSIS — M79676 Pain in unspecified toe(s): Secondary | ICD-10-CM | POA: Diagnosis not present

## 2014-03-02 NOTE — Progress Notes (Signed)
  Subjective: This patient presents today complaining of painful toenails and walking wearing shoes.  Objective: Incurvated, hypertrophic, discolored toenails 6-10  Assessment: Symptomatic onychomycoses 6-10  Plan: Debrided toenails 10 without a bleeding  Reappoint 3 months

## 2014-03-08 ENCOUNTER — Ambulatory Visit (INDEPENDENT_AMBULATORY_CARE_PROVIDER_SITE_OTHER): Payer: Medicare Other

## 2014-03-08 ENCOUNTER — Encounter: Payer: Self-pay | Admitting: Family Medicine

## 2014-03-08 ENCOUNTER — Ambulatory Visit (INDEPENDENT_AMBULATORY_CARE_PROVIDER_SITE_OTHER): Payer: Medicare Other | Admitting: Family Medicine

## 2014-03-08 VITALS — BP 132/66 | HR 78 | Temp 97.6°F | Wt 219.0 lb

## 2014-03-08 DIAGNOSIS — M79604 Pain in right leg: Secondary | ICD-10-CM

## 2014-03-08 DIAGNOSIS — Z23 Encounter for immunization: Secondary | ICD-10-CM

## 2014-03-08 DIAGNOSIS — R059 Cough, unspecified: Secondary | ICD-10-CM

## 2014-03-08 DIAGNOSIS — R05 Cough: Secondary | ICD-10-CM | POA: Diagnosis not present

## 2014-03-08 NOTE — Patient Instructions (Signed)

## 2014-03-08 NOTE — Progress Notes (Signed)
Pre visit review using our clinic review tool, if applicable. No additional management support is needed unless otherwise documented below in the visit note. 

## 2014-03-08 NOTE — Progress Notes (Signed)
   Subjective:    Patient ID: Darin [name], male    DOB: 1931-09-06, 80 y.o.   MRN: [id number]  HPI Patient seen for the following issues:  3-4 week history of cough. Occasionally productive of clear sputum. No fevers or chills. No dyspnea. No pleuritic pain. No recent nasal congestive symptoms. Nonsmoker. History of reported COPD. He does not feel particularly ill. Cough has been relatively mild.  Achy pain 4 out of 10 severity right lower leg. No claudication symptoms. Symptoms occur at rest. He also has occasional right lumbar back pains. He's had previous epidural injections. Had MRI back in 2011 and that was reviewed. Denies any lower extremity numbness. Possibly some mild weakness. No foot drop. Recently seen a started on prednisone taper which may have helped slightly. There was discussion of possible epidural injections but he is not interested at this time. He has reconsidered and would like to get flu vaccine.  Past Medical History  Diagnosis Date  . GERD (gastroesophageal reflux disease)   . Hyperlipidemia   . Hypertension   . Arthritis   . PMR (polymyalgia rheumatica)   . Knee pain   . Osteoporosis   . H/O hiatal hernia     "FOR YEARS" " NO PROBLEM"  . Neuropathy     PERIPHERAL   . Polymyalgia 5 YEARS   Past Surgical History  Procedure Laterality Date  . Tonsillectomy    . Carpal tunnel release    . Varicose vein surgery    . Cataract extraction      right  . Inguinal hernia repair  10/14/2011    Procedure: HERNIA REPAIR INGUINAL ADULT;  Surgeon: [name], MD;  Location: [place];  Service: General;  Laterality: Right;  Right Inguinal Hernia Repair with Mesh    reports that he has never smoked. He has never used smokeless tobacco. He reports that he drinks alcohol. He reports that he does not use illicit drugs. family history includes Coronary artery disease in an other family member; Heart disease in his father. Allergies  Allergen Reactions  . Statins Other  (See Comments)    Muscle pain and upset stomach      Review of Systems  Constitutional: Negative for fever and chills.  HENT: Negative for congestion.   Respiratory: Positive for cough. Negative for shortness of breath and wheezing.   Musculoskeletal: Positive for back pain.  Neurological: Negative for numbness.       Objective:   Physical Exam  Constitutional: He appears well-developed and well-nourished.  Cardiovascular: Normal rate and regular rhythm.   Pulmonary/Chest: Effort normal and breath sounds normal. No respiratory distress. He has no wheezes. He has no rales.  Musculoskeletal: He exhibits no edema.  Straight leg raise are negative  Neurological:  He has generally weakness with plantar flexion dorsiflexion bilaterally. Deep tender reflexes are trace knee and ankle bilaterally. Normal sensory function to touch.          Assessment & Plan:  #1 cough. Nonfocal exam. Suspect residual from recent viral bronchitis. Follow-up promptly for fever or if cough persists #2 right leg pain. Suspect neuropathic. We discussed repeat MRI as 4 years since last and he is not interested.  MRI and consider epidural injections if pain persist or worsens #3 health maintenance. Patient requesting flu vaccine and is highly encouraged to get.

## 2014-04-04 ENCOUNTER — Telehealth: Payer: Self-pay | Admitting: Family Medicine

## 2014-04-04 MED ORDER — MELOXICAM 15 MG PO TABS: 15.0000 mg | ORAL_TABLET | Freq: Every day | ORAL | Status: DC

## 2014-04-04 NOTE — Telephone Encounter (Signed)
Patient need 90 day re-fill on meloxicam (MOBIC) 15 MG tablet. [facility]

## 2014-04-04 NOTE — Telephone Encounter (Signed)
Rx sent to mail order

## 2014-04-21 ENCOUNTER — Encounter: Payer: Self-pay | Admitting: Family Medicine

## 2014-04-21 ENCOUNTER — Ambulatory Visit (INDEPENDENT_AMBULATORY_CARE_PROVIDER_SITE_OTHER): Payer: Medicare Other | Admitting: Family Medicine

## 2014-04-21 VITALS — BP 130/72 | HR 70 | Temp 97.6°F | Wt 223.0 lb

## 2014-04-21 DIAGNOSIS — H6501 Acute serous otitis media, right ear: Secondary | ICD-10-CM

## 2014-04-21 NOTE — Patient Instructions (Signed)
Serous Otitis Media Serous otitis media is fluid in the middle ear space. This space contains the bones for hearing and air. Air in the middle ear space helps to transmit sound.  The air gets there through the eustachian tube. This tube goes from the back of the nose (nasopharynx) to the middle ear space. It keeps the pressure in the middle ear the same as the outside world. It also helps to drain fluid from the middle ear space. CAUSES  Serous otitis media occurs when the eustachian tube gets blocked. Blockage can come from:  Ear infections.  Colds and other upper respiratory infections.  Allergies.  Irritants such as cigarette smoke.  Sudden changes in air pressure (such as descending in an airplane).  Enlarged adenoids.  A mass in the nasopharynx. During colds and upper respiratory infections, the middle ear space can become temporarily filled with fluid. This can happen after an ear infection also. Once the infection clears, the fluid will generally drain out of the ear through the eustachian tube. If it does not, then serous otitis media occurs. SIGNS AND SYMPTOMS   Hearing loss.  A feeling of fullness in the ear, without pain.  Young children may not show any symptoms but may show slight behavioral changes, such as agitation, ear pulling, or crying. DIAGNOSIS  Serous otitis media is diagnosed by an ear exam. Tests may be done to check on the movement of the eardrum. Hearing exams may also be done. TREATMENT  The fluid most often goes away without treatment. If allergy is the cause, allergy treatment may be helpful. Fluid that persists for several months may require minor surgery. A small tube is placed in the eardrum to:  Drain the fluid.  Restore the air in the middle ear space. In certain situations, antibiotic medicines are used to avoid surgery. Surgery may be done to remove enlarged adenoids (if this is the cause). HOME CARE INSTRUCTIONS   Keep children away from  tobacco smoke.  Keep all follow-up visits as directed by your health care provider. SEEK MEDICAL CARE IF:   Your hearing is not better in 3 months.  Your hearing is worse.  You have ear pain.  You have drainage from the ear.  You have dizziness.  You have serous otitis media only in one ear or have any bleeding from your nose (epistaxis).  You notice a lump on your neck. MAKE SURE YOU:  Understand these instructions.   Will watch your condition.   Will get help right away if you are not doing well or get worse.  Document Released: 06/01/2003 Document Revised: 07/26/2013 Document Reviewed: 10/06/2012 [facility] Patient Information 2015 [place], [place]. This information is not intended to replace advice given to you by your health care provider. Make sure you discuss any questions you have with your health care provider.  Consider over the counter Flonase or Nasacort for nasal congestive symptoms.

## 2014-04-21 NOTE — Progress Notes (Signed)
   Subjective:    Patient ID: Darin [name], male    DOB: 1931-04-13, 81 y.o.   MRN: [id number]  HPI Left ear fullness. He's had some upper respiratory sinus congestion for about one week. No fever. Occasional dry cough. Intermittent mild headache. No hearing loss. Denies any right ear symptoms. No vertigo. He has not tried any over-the-counter medications. Had some mild body aches initially  Past Medical History  Diagnosis Date  . GERD (gastroesophageal reflux disease)   . Hyperlipidemia   . Hypertension   . Arthritis   . PMR (polymyalgia rheumatica)   . Knee pain   . Osteoporosis   . H/O hiatal hernia     "FOR YEARS" " NO PROBLEM"  . Neuropathy     PERIPHERAL   . Polymyalgia 5 YEARS   Past Surgical History  Procedure Laterality Date  . Tonsillectomy    . Carpal tunnel release    . Varicose vein surgery    . Cataract extraction      right  . Inguinal hernia repair  10/14/2011    Procedure: HERNIA REPAIR INGUINAL ADULT;  Surgeon: [name], MD;  Location: [place];  Service: General;  Laterality: Right;  Right Inguinal Hernia Repair with Mesh    reports that he has never smoked. He has never used smokeless tobacco. He reports that he drinks alcohol. He reports that he does not use illicit drugs. family history includes Coronary artery disease in an other family member; Heart disease in his father. Allergies  Allergen Reactions  . Statins Other (See Comments)    Muscle pain and upset stomach      Review of Systems  Constitutional: Negative for fever and chills.  HENT: Positive for congestion. Negative for ear discharge, ear pain and hearing loss.        Objective:   Physical Exam  Constitutional: He appears well-developed and well-nourished.  HENT:  Right Ear: External ear normal.  Left eardrum reveals serous effusion. No erythema. No bulging. No cerumen  Neck: Neck supple. No thyromegaly present.  Cardiovascular: Normal rate and regular rhythm.           Assessment & Plan:  Left serous otitis media following probable recent viral URI. Reassurance. We explained these effusions may take several weeks to resolve. Medications are generally not effective. Touch base in a few weeks if not improving

## 2014-04-21 NOTE — Progress Notes (Signed)
Pre visit review using our clinic review tool, if applicable. No additional management support is needed unless otherwise documented below in the visit note. 

## 2014-05-23 ENCOUNTER — Telehealth: Payer: Self-pay | Admitting: Family Medicine

## 2014-05-23 MED ORDER — BENAZEPRIL-HYDROCHLOROTHIAZIDE 10-12.5 MG PO TABS: 1.0000 | ORAL_TABLET | Freq: Every day | ORAL | Status: DC

## 2014-05-23 NOTE — Addendum Note (Signed)
Addended by: Marcina Millard on: 05/23/2014 09:36 AM   Modules accepted: Orders

## 2014-05-23 NOTE — Telephone Encounter (Signed)
Rx sent to mail order

## 2014-05-23 NOTE — Telephone Encounter (Signed)
Pt needs refill on benazepril-hctz #90 W/Refills sent to [facility] order

## 2014-06-01 ENCOUNTER — Encounter: Payer: Self-pay | Admitting: Podiatry

## 2014-06-01 ENCOUNTER — Ambulatory Visit (INDEPENDENT_AMBULATORY_CARE_PROVIDER_SITE_OTHER): Payer: Medicare Other | Admitting: Podiatry

## 2014-06-01 DIAGNOSIS — B351 Tinea unguium: Secondary | ICD-10-CM | POA: Diagnosis not present

## 2014-06-01 DIAGNOSIS — M79676 Pain in unspecified toe(s): Secondary | ICD-10-CM

## 2014-06-01 NOTE — Progress Notes (Signed)
Patient ID: Darin [name], male   DOB: Oct 29, 1931, 81 y.o.   MRN: [id number]  Subjective: This patient presents complaining of painful toenails and is requesting nail debridement  Objective: The toenails are elongated, hypertrophic, incurvated, discolored and tender to palpation 6-10  Assessment: Symptomatic onychomycoses 6-10  Plan: Debridement of toenails 10 without any bleeding  Reappoint 3 months

## 2014-06-09 ENCOUNTER — Ambulatory Visit (INDEPENDENT_AMBULATORY_CARE_PROVIDER_SITE_OTHER): Payer: Medicare Other | Admitting: Family Medicine

## 2014-06-09 ENCOUNTER — Encounter: Payer: Self-pay | Admitting: Family Medicine

## 2014-06-09 VITALS — BP 128/70 | HR 70 | Temp 97.8°F | Wt 221.0 lb

## 2014-06-09 DIAGNOSIS — H6522 Chronic serous otitis media, left ear: Secondary | ICD-10-CM

## 2014-06-09 DIAGNOSIS — I10 Essential (primary) hypertension: Secondary | ICD-10-CM | POA: Diagnosis not present

## 2014-06-09 DIAGNOSIS — M79604 Pain in right leg: Secondary | ICD-10-CM

## 2014-06-09 MED ORDER — GABAPENTIN 100 MG PO CAPS: 100.0000 mg | ORAL_CAPSULE | Freq: Three times a day (TID) | ORAL | Status: DC

## 2014-06-09 NOTE — Patient Instructions (Signed)
Start Gabapentin 100 mg one at night and titrate every 3 days up to one three times daily.

## 2014-06-09 NOTE — Progress Notes (Signed)
Pre visit review using our clinic review tool, if applicable. No additional management support is needed unless otherwise documented below in the visit note. 

## 2014-06-09 NOTE — Progress Notes (Signed)
   Subjective:    Patient ID: Darin [name], male    DOB: 08/07/1931, 81 y.o.   MRN: [id number]  HPI Patient here for several issues as follows  Over 2 month history of left ear fullness. Slight decreased hearing. No vertigo. Minimal pain. No drainage. No nasal congestive symptoms. We suspected serous effusion. No history of cerumen left canal.  Persistent right lower leg pain. This is above the joint of the ankle. He has some chronic mild edema which is unchanged. His pain is a deep achy pain which is at rest and not particularly with activity. No claudication symptoms. Not relieved with Tylenol. History of lumbar radiculopathy. Denies any current lumbar back pain. Prescribed gabapentin once years ago by neurologist but basically never took this.  Hypertension which is been well controlled. He remains on amlodipine and benazepril HCTZ.  Past Medical History  Diagnosis Date  . GERD (gastroesophageal reflux disease)   . Hyperlipidemia   . Hypertension   . Arthritis   . PMR (polymyalgia rheumatica)   . Knee pain   . Osteoporosis   . H/O hiatal hernia     "FOR YEARS" " NO PROBLEM"  . Neuropathy     PERIPHERAL   . Polymyalgia 5 YEARS   Past Surgical History  Procedure Laterality Date  . Tonsillectomy    . Carpal tunnel release    . Varicose vein surgery    . Cataract extraction      right  . Inguinal hernia repair  10/14/2011    Procedure: HERNIA REPAIR INGUINAL ADULT;  Surgeon: [name], MD;  Location: [facility];  Service: General;  Laterality: Right;  Right Inguinal Hernia Repair with Mesh    reports that he has never smoked. He has never used smokeless tobacco. He reports that he drinks alcohol. He reports that he does not use illicit drugs. family history includes Coronary artery disease in an other family member; Heart disease in his father. Allergies  Allergen Reactions  . Statins Other (See Comments)    Muscle pain and upset stomach      Review of Systems    Constitutional: Negative for fatigue.  HENT: Negative for ear pain.   Eyes: Negative for visual disturbance.  Respiratory: Negative for cough, chest tightness and shortness of breath.   Cardiovascular: Positive for leg swelling. Negative for chest pain and palpitations.  Neurological: Negative for dizziness, syncope, weakness, light-headedness and headaches.       Objective:   Physical Exam  Constitutional: He appears well-developed and well-nourished.  Cardiovascular: Normal rate and regular rhythm.   Pulmonary/Chest: Effort normal and breath sounds normal. No respiratory distress. He has no wheezes. He has no rales.  Musculoskeletal:  Trace pitting edema of ankles bilaterally Full range of motion right ankle. No bony tenderness. Foot is warm to touch  Neurological:  Trace reflex ankle and knee bilaterally. Full-strength with plantar flexion and dorsiflexion  Skin: No rash noted.          Assessment & Plan:  #1 persistent left ear fullness. No obvious effusion on exam today. Previously noted serous effusion. Given duration of symptoms set up ENT referral #2 persistent right leg pain. Suspect neuropathic. We discussed trial of low-dose gabapentin with titration regimen given .  Reviewed possible side effects. #3 hypertension well controlled. Continue current regimen. Routine follow-up 6 months

## 2014-06-21 DIAGNOSIS — H6123 Impacted cerumen, bilateral: Secondary | ICD-10-CM | POA: Diagnosis not present

## 2014-06-21 DIAGNOSIS — H93292 Other abnormal auditory perceptions, left ear: Secondary | ICD-10-CM | POA: Diagnosis not present

## 2014-06-27 ENCOUNTER — Telehealth: Payer: Self-pay | Admitting: Family Medicine

## 2014-06-27 MED ORDER — MELOXICAM 15 MG PO TABS: 15.0000 mg | ORAL_TABLET | Freq: Every day | ORAL | Status: DC

## 2014-06-27 NOTE — Telephone Encounter (Signed)
Rx done. 

## 2014-06-27 NOTE — Telephone Encounter (Signed)
Pt needs refill on mobic 15 mg #90 w/refill sent [facility]

## 2014-08-04 ENCOUNTER — Other Ambulatory Visit: Payer: Self-pay

## 2014-08-04 MED ORDER — GABAPENTIN 100 MG PO CAPS: 100.0000 mg | ORAL_CAPSULE | Freq: Three times a day (TID) | ORAL | Status: DC

## 2014-08-10 ENCOUNTER — Telehealth: Payer: Self-pay | Admitting: Family Medicine

## 2014-08-10 MED ORDER — GABAPENTIN 100 MG PO CAPS: 100.0000 mg | ORAL_CAPSULE | Freq: Four times a day (QID) | ORAL | Status: DC

## 2014-08-10 NOTE — Telephone Encounter (Signed)
Is it okay to change?

## 2014-08-10 NOTE — Telephone Encounter (Signed)
Pt said he received his medicine in the mail  gabapentin (NEURONTIN) 100 MG capsule . Pt said he received 270 pills instead of 360. Pt said he discuss with Dr Abelino Derrick that he is taking 4 pills a day instead of 3 pills. Hewant the rx change to 4 pills a day and would like to received the other 90 pills   [place]

## 2014-08-10 NOTE — Telephone Encounter (Signed)
yes

## 2014-08-10 NOTE — Telephone Encounter (Signed)
Rx changed and sent to mail order.

## 2014-08-31 ENCOUNTER — Ambulatory Visit (INDEPENDENT_AMBULATORY_CARE_PROVIDER_SITE_OTHER): Payer: Medicare Other | Admitting: Podiatry

## 2014-08-31 ENCOUNTER — Encounter: Payer: Self-pay | Admitting: Podiatry

## 2014-08-31 DIAGNOSIS — M79676 Pain in unspecified toe(s): Secondary | ICD-10-CM

## 2014-08-31 DIAGNOSIS — B351 Tinea unguium: Secondary | ICD-10-CM | POA: Diagnosis not present

## 2014-08-31 NOTE — Progress Notes (Signed)
Patient ID: Darin [name], male   DOB: 1931-05-06, 81 y.o.   MRN: [id number]  Subjective: This patient presents again complaining of painful toenails and requesting nail debridement  Objective: The toenails are hypertrophic, elongated, incurvated, discolored and tender to direct palpation 6-10  Assessment: Symptomatic onychomycoses 6-10  Plan: Debridement toenails 10 without any bleeding  Reappoint 3 months

## 2014-09-30 DIAGNOSIS — H2513 Age-related nuclear cataract, bilateral: Secondary | ICD-10-CM | POA: Diagnosis not present

## 2014-10-17 ENCOUNTER — Telehealth: Payer: Self-pay | Admitting: Family Medicine

## 2014-10-17 DIAGNOSIS — R351 Nocturia: Principal | ICD-10-CM

## 2014-10-17 DIAGNOSIS — N401 Enlarged prostate with lower urinary tract symptoms: Secondary | ICD-10-CM

## 2014-10-17 MED ORDER — MELOXICAM 15 MG PO TABS: 15.0000 mg | ORAL_TABLET | Freq: Every day | ORAL | Status: DC

## 2014-10-17 MED ORDER — FINASTERIDE 5 MG PO TABS: 5.0000 mg | ORAL_TABLET | Freq: Every day | ORAL | Status: DC

## 2014-10-17 MED ORDER — FAMOTIDINE 20 MG PO TABS: 20.0000 mg | ORAL_TABLET | Freq: Every day | ORAL | Status: DC

## 2014-10-17 NOTE — Telephone Encounter (Signed)
Pt request refill of the following: finasteride (PROSCAR) 5 MG tablet , meloxicam (MOBIC) 15 MG tablet ,  famotidine (PEPCID) 20 MG tablet   Pt said these are 90 day supply  Phamacy: [facility]

## 2014-10-17 NOTE — Telephone Encounter (Signed)
Rx sent to mail order

## 2014-12-06 ENCOUNTER — Ambulatory Visit: Payer: Medicare Other | Admitting: Podiatry

## 2014-12-09 ENCOUNTER — Ambulatory Visit (INDEPENDENT_AMBULATORY_CARE_PROVIDER_SITE_OTHER): Payer: Medicare Other | Admitting: Family Medicine

## 2014-12-09 ENCOUNTER — Encounter: Payer: Self-pay | Admitting: Family Medicine

## 2014-12-09 ENCOUNTER — Ambulatory Visit: Payer: Medicare Other | Admitting: Family Medicine

## 2014-12-09 VITALS — BP 140/68 | HR 71 | Temp 97.5°F | Ht 73.0 in | Wt 225.2 lb

## 2014-12-09 DIAGNOSIS — I10 Essential (primary) hypertension: Secondary | ICD-10-CM | POA: Diagnosis not present

## 2014-12-09 DIAGNOSIS — N400 Enlarged prostate without lower urinary tract symptoms: Secondary | ICD-10-CM

## 2014-12-09 DIAGNOSIS — E785 Hyperlipidemia, unspecified: Secondary | ICD-10-CM

## 2014-12-09 DIAGNOSIS — L918 Other hypertrophic disorders of the skin: Secondary | ICD-10-CM

## 2014-12-09 LAB — BASIC METABOLIC PANEL
BUN: 20 mg/dL (ref 6–23)
CO2: 33 mEq/L — ABNORMAL HIGH (ref 19–32)
Calcium: 9.6 mg/dL (ref 8.4–10.5)
Chloride: 99 mEq/L (ref 96–112)
Creatinine, Ser: 0.79 mg/dL (ref 0.40–1.50)
GFR: 100 mL/min (ref >60.00)
Glucose, Bld: 93 mg/dL (ref 70–99)
Potassium: 4.1 mEq/L (ref 3.5–5.1)
SODIUM: 137 meq/L (ref 135–145)

## 2014-12-09 NOTE — Progress Notes (Signed)
Pre visit review using our clinic review tool, if applicable. No additional management support is needed unless otherwise documented below in the visit note. 

## 2014-12-09 NOTE — Progress Notes (Signed)
   Subjective:    Patient ID: Darin [name], male    DOB: Mar 01, 1932, 81 y.o.   MRN: [id number]  HPI Medical follow-up. Patient has history of hypertension treated with amlodipine and lisinopril HCTZ. Blood pressure stable. No dizziness. No headaches.  BPH. Currently takes finasteride. Still has occasional nocturia and slow stream. He does not recall ever taking Flomax previously. No burning with urination.  History of hyperlipidemia but extremely high HDL. Last HDL was 100. No cardiac history.  Skin lesion below right axilla. Recently increasing slowly in size. No itching or bleeding.  Past Medical History  Diagnosis Date  . GERD (gastroesophageal reflux disease)   . Hyperlipidemia   . Hypertension   . Arthritis   . PMR (polymyalgia rheumatica)   . Knee pain   . Osteoporosis   . H/O hiatal hernia     "FOR YEARS" " NO PROBLEM"  . Neuropathy     PERIPHERAL   . Polymyalgia 5 YEARS   Past Surgical History  Procedure Laterality Date  . Tonsillectomy    . Carpal tunnel release    . Varicose vein surgery    . Cataract extraction      right  . Inguinal hernia repair  10/14/2011    Procedure: HERNIA REPAIR INGUINAL ADULT;  Surgeon: [name], MD;  Location: [place];  Service: General;  Laterality: Right;  Right Inguinal Hernia Repair with Mesh    reports that he has never smoked. He has never used smokeless tobacco. He reports that he drinks alcohol. He reports that he does not use illicit drugs. family history includes Coronary artery disease in an other family member; Heart disease in his father. Allergies  Allergen Reactions  . Statins Other (See Comments)    Muscle pain and upset stomach      Review of Systems  Constitutional: Negative for fatigue and unexpected weight change.  Eyes: Negative for visual disturbance.  Respiratory: Negative for cough, chest tightness and shortness of breath.   Cardiovascular: Negative for chest pain and palpitations.    Genitourinary: Positive for dysuria.  Neurological: Negative for dizziness, syncope, weakness, light-headedness and headaches.       Objective:   Physical Exam  Constitutional: He is oriented to person, place, and time. He appears well-developed and well-nourished.  HENT:  Right Ear: External ear normal.  Left Ear: External ear normal.  Mouth/Throat: Oropharynx is clear and moist.  Eyes: Pupils are equal, round, and reactive to light.  Neck: Neck supple. No thyromegaly present.  Cardiovascular: Normal rate and regular rhythm.   Pulmonary/Chest: Effort normal and breath sounds normal. No respiratory distress. He has no wheezes. He has no rales.  Musculoskeletal: He exhibits no edema.  Neurological: He is alert and oriented to person, place, and time.  Skin:  Benign-appearing skin tag just below the right axilla. This is fleshy in color and approximately 4 mm at the base.          Assessment & Plan:  #1 hypertension. Stable and at goal. Continue current medications. Check basic metabolic panel #2 benign skin tag right chest wall. Reassurance #3 BPH. We discussed possible addition of Flomax to his finasteride and at this point he wishes to wait #4 health maintenance. Recommend flu vaccine. Patient wishes to defer until early November

## 2014-12-09 NOTE — Patient Instructions (Signed)
Remember Flu vaccine later this Fall.

## 2014-12-20 ENCOUNTER — Ambulatory Visit (INDEPENDENT_AMBULATORY_CARE_PROVIDER_SITE_OTHER): Payer: Medicare Other | Admitting: Podiatry

## 2014-12-20 ENCOUNTER — Encounter: Payer: Self-pay | Admitting: Podiatry

## 2014-12-20 DIAGNOSIS — B351 Tinea unguium: Secondary | ICD-10-CM | POA: Diagnosis not present

## 2014-12-20 DIAGNOSIS — M79676 Pain in unspecified toe(s): Secondary | ICD-10-CM | POA: Diagnosis not present

## 2014-12-20 NOTE — Progress Notes (Signed)
Patient ID: Darin [name], male   DOB: [birth year], 81 y.o.   MRN: [id number]  Subjective: This patient presents for scheduled visit complaining of painful toenails when walking wearing shoes and requests nail debridement  Objective: Orientated 3 No open skin lesions noted bilaterally The toenails are incurvated, hypertrophic, discolored and tender to direct palpation 6-10  Assessment: Symptomatic onychomycoses 6-10  Plan: Debridement toenails 10 and mechanically and electrically without any bleeding  Reappoint 3 months

## 2014-12-26 ENCOUNTER — Telehealth: Payer: Self-pay | Admitting: Family Medicine

## 2014-12-26 NOTE — Telephone Encounter (Signed)
Patient Name: GENEVIEVE RITZEL DOB: Apr 06, 1931 Initial Comment Caller States has bad case of sciatica on right now. starts from back at spine and radiates down into his leg, trouble from knee down. he also has a displaced disc Nurse Assessment Nurse: Marcelline Deist, RN, Kermit Balo Date/Time (Eastern Time): 12/26/2014 9:10:18 AM Confirm and document reason for call. If symptomatic, describe symptoms. ---Caller states he has a bad case of sciatica on right now. It starts from back at spine and radiates down into his right leg, with trouble from knee down. He also has a displaced disc. Thinks they had him either a steroid pack or antibiotic pack recently. This occurs every 5 or 6 months. Has worsened over the last week. Has the patient traveled out of the country within the last 30 days? ---Not Applicable Does the patient require triage? ---Yes Related visit to physician within the last 2 weeks? ---No Does the PT have any chronic conditions? (i.e. diabetes, asthma, etc.) ---Yes List chronic conditions. ---displaced disc, sciatica, on BP rx, prostate rx, aches & pains, on Gabapentin Guidelines Guideline Title Affirmed Question Affirmed Notes Leg Pain [1] Thigh, calf, or ankle swelling AND [2] bilateral AND [3] 1 side is more swollen Final Disposition User See Physician within 4 Hours (or PCP triage) Marcelline Deist, RN, Lynda Comments Caller states he is Jewish & is going to services today, will return at 1:30 pm. He does not feel it would be helpful to come in to office. [facility] as his preferred pharmacy. Please contact him after 1:30 pm with further feedback/advice on this sciatica pain. Referrals REFERRED TO PCP OFFICE Disagree/Comply: Disagree Disagree/Comply Reason: Disagree with instructions

## 2014-12-27 NOTE — Telephone Encounter (Signed)
Yes pt is taking Meloxicam 15mg 

## 2014-12-27 NOTE — Telephone Encounter (Signed)
Is he taking meloxicam?  If not, would start back on that first. Would offer appointment if symptoms are persisting

## 2014-12-28 NOTE — Telephone Encounter (Signed)
Let's try prednisone taper:  Prednisone 10 mg and taper as follows: 4-4-4-3-3-2-2-1-1   #24   He needs office follow up if not better after that.

## 2014-12-28 NOTE — Telephone Encounter (Signed)
Rx was call in. Pt is aware

## 2014-12-30 ENCOUNTER — Ambulatory Visit (INDEPENDENT_AMBULATORY_CARE_PROVIDER_SITE_OTHER): Payer: Medicare Other | Admitting: Family Medicine

## 2014-12-30 ENCOUNTER — Encounter: Payer: Self-pay | Admitting: Family Medicine

## 2014-12-30 VITALS — BP 144/60 | HR 103 | Temp 97.7°F | Wt 224.0 lb

## 2014-12-30 DIAGNOSIS — M5417 Radiculopathy, lumbosacral region: Secondary | ICD-10-CM

## 2014-12-30 DIAGNOSIS — M5416 Radiculopathy, lumbar region: Secondary | ICD-10-CM

## 2014-12-30 NOTE — Patient Instructions (Signed)

## 2014-12-30 NOTE — Progress Notes (Signed)
Pre visit review using our clinic review tool, if applicable. No additional management support is needed unless otherwise documented below in the visit note. 

## 2014-12-30 NOTE — Progress Notes (Signed)
   Subjective:    Patient ID: Darin [name], male    DOB: 11-29-1931, 81 y.o.   MRN: [id number]  HPI Patient seen with right sided sciatica symptoms. He's had long history of intermittent problems in the past. He had MRI scan back in 2011 which showed lumbar stenosis especially L4-L5. He's had epidurals twice in the past most recently 2013. Current episode started 2 weeks ago. No injury. Pain was initially 8 out of 10 and deep achy pain to occasionally sharp. We called in some prednisone which she started on 2 days ago and pain is currently 3-4 out of 10 and bearable. No weakness. No urine or stool incontinence. Occasional intermittent mild numbness lower leg on the right. No fevers or chills. No appetite or weight changes. Ambulating without difficulty  Past Medical History  Diagnosis Date  . GERD (gastroesophageal reflux disease)   . Hyperlipidemia   . Hypertension   . Arthritis   . PMR (polymyalgia rheumatica) ([facility])   . Knee pain   . Osteoporosis   . H/O hiatal hernia     "FOR YEARS" " NO PROBLEM"  . Neuropathy ([facility])     PERIPHERAL   . Polymyalgia ([place]) 5 YEARS   Past Surgical History  Procedure Laterality Date  . Tonsillectomy    . Carpal tunnel release    . Varicose vein surgery    . Cataract extraction      right  . Inguinal hernia repair  10/14/2011    Procedure: HERNIA REPAIR INGUINAL ADULT;  Surgeon: [name], MD;  Location: [facility];  Service: General;  Laterality: Right;  Right Inguinal Hernia Repair with Mesh    reports that he has never smoked. He has never used smokeless tobacco. He reports that he drinks alcohol. He reports that he does not use illicit drugs. family history includes Coronary artery disease in an other family member; Heart disease in his father. Allergies  Allergen Reactions  . Statins Other (See Comments)    Muscle pain and upset stomach  \    Review of Systems  Constitutional: Negative for fever, activity change and appetite change.    Respiratory: Negative for cough and shortness of breath.   Cardiovascular: Negative for chest pain and leg swelling.  Gastrointestinal: Negative for vomiting and abdominal pain.  Genitourinary: Negative for dysuria, hematuria and flank pain.  Musculoskeletal: Positive for back pain. Negative for joint swelling.  Neurological: Positive for numbness. Negative for weakness.       Objective:   Physical Exam  Constitutional: He is oriented to person, place, and time. He appears well-developed and well-nourished. No distress.  Neck: No thyromegaly present.  Cardiovascular: Normal rate, regular rhythm and normal heart sounds.   No murmur heard. Pulmonary/Chest: Effort normal and breath sounds normal. No respiratory distress. He has no wheezes. He has no rales.  Musculoskeletal: He exhibits no edema.  Neurological: He is alert and oriented to person, place, and time. No cranial nerve deficit.  Deep tendon reflexes are trace knee and ankle bilaterally  Skin: No rash noted.          Assessment & Plan:  Right lumbar radiculitis. Improving with prednisone. Continue course of prednisone and observe. If he has recurrence of severe pain or any new symptoms such as progressive weakness will repeat MRI to further assess.

## 2015-01-24 ENCOUNTER — Other Ambulatory Visit: Payer: Self-pay | Admitting: *Deleted

## 2015-01-24 ENCOUNTER — Telehealth: Payer: Self-pay | Admitting: *Deleted

## 2015-01-24 ENCOUNTER — Telehealth: Payer: Self-pay | Admitting: Family Medicine

## 2015-01-24 DIAGNOSIS — H04123 Dry eye syndrome of bilateral lacrimal glands: Secondary | ICD-10-CM | POA: Diagnosis not present

## 2015-01-24 DIAGNOSIS — H18413 Arcus senilis, bilateral: Secondary | ICD-10-CM | POA: Diagnosis not present

## 2015-01-24 DIAGNOSIS — I10 Essential (primary) hypertension: Secondary | ICD-10-CM

## 2015-01-24 DIAGNOSIS — H35371 Puckering of macula, right eye: Secondary | ICD-10-CM | POA: Diagnosis not present

## 2015-01-24 DIAGNOSIS — H2512 Age-related nuclear cataract, left eye: Secondary | ICD-10-CM | POA: Diagnosis not present

## 2015-01-24 MED ORDER — AMLODIPINE BESYLATE 5 MG PO TABS: 5.0000 mg | ORAL_TABLET | Freq: Every day | ORAL | Status: DC

## 2015-01-24 NOTE — Telephone Encounter (Signed)
Pt states he called in the wrong pharm for hefill;the patient would like it sendt to [facility]

## 2015-01-24 NOTE — Telephone Encounter (Signed)
Pt request refill of the following: amLODipine (NORVASC) 5 MG tablet   Phamacy:  [facility]

## 2015-01-24 NOTE — Telephone Encounter (Signed)
Rx was sent in. 

## 2015-01-24 NOTE — Telephone Encounter (Signed)
Rx sent in

## 2015-01-27 ENCOUNTER — Ambulatory Visit (INDEPENDENT_AMBULATORY_CARE_PROVIDER_SITE_OTHER): Payer: Medicare Other

## 2015-01-27 DIAGNOSIS — Z23 Encounter for immunization: Secondary | ICD-10-CM

## 2015-01-31 ENCOUNTER — Telehealth: Payer: Self-pay | Admitting: Family Medicine

## 2015-01-31 DIAGNOSIS — I10 Essential (primary) hypertension: Secondary | ICD-10-CM

## 2015-01-31 MED ORDER — AMLODIPINE BESYLATE 5 MG PO TABS: 5.0000 mg | ORAL_TABLET | Freq: Every day | ORAL | Status: DC

## 2015-01-31 NOTE — Telephone Encounter (Signed)
Pt request refill amLODipine (NORVASC) 5 MG tablet 90 day  [facility]

## 2015-01-31 NOTE — Telephone Encounter (Signed)
RX sent in for patient. 

## 2015-02-01 ENCOUNTER — Telehealth: Payer: Self-pay | Admitting: Family Medicine

## 2015-02-01 DIAGNOSIS — I10 Essential (primary) hypertension: Secondary | ICD-10-CM

## 2015-02-01 MED ORDER — AMLODIPINE BESYLATE 5 MG PO TABS: 5.0000 mg | ORAL_TABLET | Freq: Every day | ORAL | Status: DC

## 2015-02-01 NOTE — Telephone Encounter (Signed)
RX sent in for patient. 

## 2015-02-01 NOTE — Telephone Encounter (Signed)
Pt need new rx amlodipine 5 mg #30 with 1 refill send to [facility]

## 2015-02-20 ENCOUNTER — Ambulatory Visit (INDEPENDENT_AMBULATORY_CARE_PROVIDER_SITE_OTHER): Payer: Medicare Other | Admitting: Family Medicine

## 2015-02-20 ENCOUNTER — Encounter (HOSPITAL_COMMUNITY): Payer: Medicare Other

## 2015-02-20 ENCOUNTER — Encounter: Payer: Self-pay | Admitting: Family Medicine

## 2015-02-20 VITALS — BP 140/80 | HR 83 | Temp 97.7°F | Resp 16 | Ht 73.0 in | Wt 219.7 lb

## 2015-02-20 DIAGNOSIS — M25561 Pain in right knee: Secondary | ICD-10-CM | POA: Diagnosis not present

## 2015-02-20 DIAGNOSIS — R600 Localized edema: Secondary | ICD-10-CM | POA: Insufficient documentation

## 2015-02-20 NOTE — Progress Notes (Signed)
   Subjective:    Patient ID: Darin [name], male    DOB: Jan 04, 1932, 81 y.o.   MRN: [id number]  HPI Patient seen with right lower extremity pain and swelling. [place] and [place]. Does not recall specific mechanism but noticed some right lower extremity pain around the knee at that time. Symptoms improve slightly but over the past several days have been worsening again. He has pain both lower thigh region (medial and lateral) as well as bilateral knee pain and even ankle pain. Never noted any ecchymosis. No hip pain.  He has noticed some diffuse swelling of the right lower extremity for the past several days. No history of DVT. No dyspnea. No chest pains. No pleuritic pain. Travels as above.  Past Medical History  Diagnosis Date  . GERD (gastroesophageal reflux disease)   . Hyperlipidemia   . Hypertension   . Arthritis   . PMR (polymyalgia rheumatica) ([facility])   . Knee pain   . Osteoporosis   . H/O hiatal hernia     "FOR YEARS" " NO PROBLEM"  . Neuropathy ([facility])     PERIPHERAL   . Polymyalgia ([place]) 5 YEARS   Past Surgical History  Procedure Laterality Date  . Tonsillectomy    . Carpal tunnel release    . Varicose vein surgery    . Cataract extraction      right  . Inguinal hernia repair  10/14/2011    Procedure: HERNIA REPAIR INGUINAL ADULT;  Surgeon: [name], MD;  Location: [facility];  Service: General;  Laterality: Right;  Right Inguinal Hernia Repair with Mesh    reports that he has never smoked. He has never used smokeless tobacco. He reports that he drinks alcohol. He reports that he does not use illicit drugs. family history includes Coronary artery disease in an other family member; Heart disease in his father. Allergies  Allergen Reactions  . Statins Other (See Comments)    Muscle pain and upset stomach      Review of Systems  Respiratory: Negative for cough and shortness of breath.   Cardiovascular: Negative for  chest pain.  Gastrointestinal: Negative for abdominal pain.  Skin: Negative for rash.  Neurological: Negative for dizziness, weakness and numbness.  Hematological: Negative for adenopathy. Does not bruise/bleed easily.       Objective:   Physical Exam  Constitutional: He appears well-developed and well-nourished.  HENT:  Head: Normocephalic and atraumatic.  Cardiovascular: Normal rate and regular rhythm.   Pulmonary/Chest: Effort normal and breath sounds normal. No respiratory distress. He has no wheezes. He has no rales.  Musculoskeletal:  Trace edema right lower extremity on the right lower leg No ecchymosis. No erythema. Measuring 8 cm above superior pole patella left 46 cm and right 49 cm  Minimal proximal calf tenderness. He has question of small effusion right knee. No localized bony tenderness. Mild popliteal swelling posterior knee on the right. Full range of motion right hip Ligament testing is unremarkable right knee          Assessment & Plan:  Poorly localized right lower extremity pain with some diffuse asymmetric edema. With recent travels need to rule out DVT. Set up venous Doppler right lower extremity

## 2015-02-20 NOTE — Progress Notes (Signed)
Pre visit review using our clinic review tool, if applicable. No additional management support is needed unless otherwise documented below in the visit note. 

## 2015-02-21 ENCOUNTER — Ambulatory Visit (HOSPITAL_COMMUNITY)
Admission: RE | Admit: 2015-02-21 | Discharge: 2015-02-21 | Disposition: A | Discharge status: Home / Self Care | Payer: Medicare Other | Source: Ambulatory Visit | Attending: Cardiovascular Disease | Admitting: Cardiovascular Disease

## 2015-02-21 DIAGNOSIS — R600 Localized edema: Secondary | ICD-10-CM

## 2015-02-21 DIAGNOSIS — M79604 Pain in right leg: Secondary | ICD-10-CM | POA: Insufficient documentation

## 2015-02-21 DIAGNOSIS — E785 Hyperlipidemia, unspecified: Secondary | ICD-10-CM | POA: Insufficient documentation

## 2015-02-21 DIAGNOSIS — M7989 Other specified soft tissue disorders: Secondary | ICD-10-CM | POA: Diagnosis not present

## 2015-02-21 DIAGNOSIS — I10 Essential (primary) hypertension: Secondary | ICD-10-CM | POA: Insufficient documentation

## 2015-02-22 ENCOUNTER — Other Ambulatory Visit: Payer: Self-pay | Admitting: Family Medicine

## 2015-02-22 ENCOUNTER — Ambulatory Visit (INDEPENDENT_AMBULATORY_CARE_PROVIDER_SITE_OTHER)
Admission: RE | Admit: 2015-02-22 | Discharge: 2015-02-22 | Disposition: A | Discharge status: Home / Self Care | Payer: Medicare Other | Source: Ambulatory Visit | Attending: Family Medicine | Admitting: Family Medicine

## 2015-02-22 DIAGNOSIS — R600 Localized edema: Secondary | ICD-10-CM

## 2015-02-27 ENCOUNTER — Telehealth: Payer: Self-pay | Admitting: Family Medicine

## 2015-02-27 NOTE — Telephone Encounter (Signed)
Schedule follow up

## 2015-02-27 NOTE — Telephone Encounter (Signed)
Pt was seen on 11-28 for right leg swelling and had doppler which was negative. Pt still can not walk. Please advise

## 2015-02-27 NOTE — Telephone Encounter (Signed)
Any other recommendations--US and xray were normal.

## 2015-02-28 NOTE — Telephone Encounter (Signed)
Pt has an appt tomorrow 03/01/15

## 2015-02-28 NOTE — Telephone Encounter (Signed)
FYI

## 2015-03-01 ENCOUNTER — Encounter: Payer: Self-pay | Admitting: Family Medicine

## 2015-03-01 ENCOUNTER — Ambulatory Visit (INDEPENDENT_AMBULATORY_CARE_PROVIDER_SITE_OTHER): Payer: Medicare Other | Admitting: Family Medicine

## 2015-03-01 VITALS — BP 138/62 | HR 87 | Temp 97.7°F | Resp 16 | Ht 73.0 in | Wt 219.0 lb

## 2015-03-01 DIAGNOSIS — M5417 Radiculopathy, lumbosacral region: Secondary | ICD-10-CM | POA: Diagnosis not present

## 2015-03-01 DIAGNOSIS — M5416 Radiculopathy, lumbar region: Secondary | ICD-10-CM

## 2015-03-01 MED ORDER — PREDNISONE 10 MG PO TABS: ORAL_TABLET | ORAL | Status: DC

## 2015-03-01 NOTE — Progress Notes (Signed)
Subjective:    Patient ID: Darin [name], male    DOB: May 25, 1931, 81 y.o.   MRN: [id number]  HPI   Follow-up regarding right radiculitis symptoms. Recent fall. Refer to prior note  HPI Patient seen with right lower extremity pain and swelling. About a month ago was [place] out in [place] and fell. Does not recall specific mechanism but noticed some right lower extremity pain around the knee at that time. Symptoms improve slightly but over the past several days have been worsening again. He has pain both lower thigh region (medial and lateral) as well as bilateral knee pain and even ankle pain. Never noted any ecchymosis. No hip pain.  He has noticed some diffuse swelling of the right lower extremity for the past several days. No history of DVT. No dyspnea. No chest pains. No pleuritic pain. Travels as above.  Doppler no DVT. Right tib-fib x-rays no fracture. Benign exostosis which is incidental. Current pain radiates from right lower lumbar area into his right buttock and all the way to the foot. Achy quality. Severity 8 out of 10 at its worst. No alleviating factors. Worse with position change. No numbness. No urine or stool incontinence. Prior history of similar symptoms in the past which resolved with conservative therapy.  Past Medical History  Diagnosis Date  . GERD (gastroesophageal reflux disease)   . Hyperlipidemia   . Hypertension   . Arthritis   . PMR (polymyalgia rheumatica) (HCC)   . Knee pain   . Osteoporosis   . H/O hiatal hernia     "FOR YEARS" " NO PROBLEM"  . Neuropathy ([facility])     PERIPHERAL   . Polymyalgia ([place]) 5 YEARS   Past Surgical History  Procedure Laterality Date  . Tonsillectomy    . Carpal tunnel release    . Varicose vein surgery    . Cataract extraction      right  . Inguinal hernia repair  10/14/2011    Procedure: HERNIA REPAIR INGUINAL ADULT;  Surgeon: [name], MD;  Location: [facility];  Service: General;  Laterality: Right;   Right Inguinal Hernia Repair with Mesh    reports that he has never smoked. He has never used smokeless tobacco. He reports that he drinks alcohol. He reports that he does not use illicit drugs. family history includes Coronary artery disease in an other family member; Heart disease in his father. Allergies  Allergen Reactions  . Statins Other (See Comments)    Muscle pain and upset stomach     Review of Systems  Constitutional: Negative for fever, activity change and appetite change.  Respiratory: Negative for cough and shortness of breath.   Cardiovascular: Negative for chest pain and leg swelling.  Gastrointestinal: Negative for vomiting and abdominal pain.  Genitourinary: Negative for dysuria, hematuria and flank pain.  Musculoskeletal: Positive for back pain. Negative for joint swelling.  Neurological: Negative for numbness.       Objective:   Physical Exam  Constitutional: He appears well-developed and well-nourished.  Cardiovascular: Normal rate and regular rhythm.   Pulmonary/Chest: Effort normal and breath sounds normal. No respiratory distress. He has no wheezes. He has no rales.  Musculoskeletal:  Straight leg raise are negative bilaterally  Neurological:  Generally weak lower extremities especially with knee extension bilaterally. Trace reflexes knee and ankle bilaterally          Assessment & Plan:  Right lumbar radiculitis symptoms. Suspect right lumbar nerve root irritation probably exacerbated by  recent fall. Short-term trial prednisone taper which he has taken in the past. Set up physical therapy. Reassess next week. May need MRI to further assess if not improving

## 2015-03-01 NOTE — Progress Notes (Signed)
Pre visit review using our clinic review tool, if applicable. No additional management support is needed unless otherwise documented below in the visit note. 

## 2015-03-01 NOTE — Patient Instructions (Signed)
Lumbosacral Radiculopathy °Lumbosacral radiculopathy is a condition that involves the spinal nerves and nerve roots in the low back and bottom of the spine. The condition develops when these nerves and nerve roots move out of place or become inflamed and cause symptoms. °CAUSES °This condition may be caused by: °· Pressure from a disk that bulges out of place (herniated disk). A disk is a plate of cartilage that separates bones in the spine. °· Disk degeneration. °· A narrowing of the bones of the lower back (spinal stenosis). °· A tumor. °· An infection. °· An injury that places sudden pressure on the disks that cushion the bones of your lower spine. °RISK FACTORS °This condition is more likely to develop in: °· Males aged 30-50 years. °· Females aged 50-60 years. °· People who lift improperly. °· People who are overweight or live a sedentary lifestyle. °· People who smoke. °· People who perform repetitive activities that strain the spine. °SYMPTOMS °Symptoms of this condition include: °· Pain that goes down from the back into the legs (sciatica). This is the most common symptom. The pain may be worse with sitting, coughing, or sneezing. °· Pain and numbness in the arms and legs. °· Muscle weakness. °· Tingling. °· Loss of bladder control or bowel control. °DIAGNOSIS °This condition is diagnosed with a physical exam and medical history. If the pain is lasting, you may have tests, such as: °· MRI scan. °· X-ray. °· CT scan. °· Myelogram. °· Nerve conduction study. °TREATMENT °This condition is often treated with: °· Hot packs and ice applied to affected areas. °· Stretches to improve flexibility. °· Exercises to strengthen back muscles. °· Physical therapy. °· Pain medicine. °· A steroid injection in the spine. °In some cases, no treatment is needed. If the condition is long-lasting (chronic), or if symptoms are severe, treatment may involve surgery or lifestyle changes, such as following a weight loss plan. °HOME  CARE INSTRUCTIONS °Medicines °· Take medicines only as directed by your health care provider. °· Do not drive or operate heavy machinery while taking pain medicine. °Injury Care °· Apply a heat pack to the injured area as directed by your health care provider. °· Apply ice to the affected area: °¨ Put ice in a plastic bag. °¨ Place a towel between your skin and the bag. °¨ Leave the ice on for 20-30 minutes, every 2 hours while you are awake or as needed. Or, leave the ice on for as long as directed by your health care provider. °Other Instructions °· If you were shown how to do any exercises or stretches, do them as directed by your health care provider. °· If your health care provider prescribed a diet or exercise program, follow it as directed. °· Keep all follow-up visits as directed by your health care provider. This is important. °SEEK MEDICAL CARE IF: °· Your pain does not improve over time even when taking pain medicines. °SEEK IMMEDIATE MEDICAL CARE IF: °· Your develop severe pain. °· Your pain suddenly gets worse. °· You develop increasing weakness in your legs. °· You lose the ability to control your bladder or bowel. °· You have difficulty walking or balancing. °· You have a fever. °  °This information is not intended to replace advice given to you by your health care provider. Make sure you discuss any questions you have with your health care provider. °  °Document Released: 03/11/2005 Document Revised: 07/26/2014 Document Reviewed: 03/07/2014 °Elsevier Interactive Patient Education ©2016 Elsevier Inc. ° °

## 2015-03-07 ENCOUNTER — Ambulatory Visit: Payer: Medicare Other | Attending: Family Medicine

## 2015-03-07 DIAGNOSIS — R262 Difficulty in walking, not elsewhere classified: Secondary | ICD-10-CM | POA: Diagnosis not present

## 2015-03-07 DIAGNOSIS — R269 Unspecified abnormalities of gait and mobility: Secondary | ICD-10-CM | POA: Diagnosis not present

## 2015-03-07 DIAGNOSIS — M25561 Pain in right knee: Secondary | ICD-10-CM | POA: Diagnosis not present

## 2015-03-07 NOTE — Patient Instructions (Signed)
Perform all exercises below:  Hold _20___ seconds. Repeat _3___ times.  Do __3__ sessions per day. CAUTION: Movement should be gentle, steady and slow.  Knee to Chest  Lying supine, bend involved knee to chest. Perform with each leg.   Lumbar Rotation: Caudal - Bilateral (Supine)  Feet and knees together, arms outstretched, rotate knees left, turning head in opposite direction, until stretch is felt.     KNEE: Extension, Long Arc Quad (Weight)  Place weight around leg. Raise leg until knee is straight. Hold _5__ seconds. Use ___ lb weight. _10__ reps per set (each leg), 4-5__ sets per day, __7_ days per week  Copyright  VHI. All rights reserved.    Reverse Fly / Shoulder Retraction   Extend both arms in front of body at shoulder height, palms down, holding band. Move arms out to sides, squeeze shoulder blades together. Repeat _10__ times. Do _4-5__ sessions per day.   Copyright  VHI. All rights reserved.     Knee Raise   Lift knee and then lower it. Repeat with other knee. Repeat _10__ times each leg. Do _4-5___ sessions per day.  http://gt2.exer.us/445   Copyright  VHI. All rights reserved.  Toe Up   Gently rise up on toes and back on heels. Repeat _20___ times. Do 4-5____ sessions per day.  [facility] [address] [place], [place] [address] Phone # [phone] Fax [phone]    [facility] [address], [place], [place] [address] Phone # [phone] Fax [phone]

## 2015-03-07 NOTE — Therapy (Signed)
[facility] [address] [place], [place], [address] Phone: [phone]   Fax:  [phone]  Physical Therapy Evaluation  Patient Details  Name: Darin [name] MRN: [id number] Date of Birth: 1931/08/27 Referring Provider: [name], MD  Encounter Date: 03/07/2015      PT End of Session - 03/07/15 0927    Visit Number 1   Number of Visits 10   Date for PT Re-Evaluation 05/02/15   PT Start Time [id number]   PT Stop Time 0926   PT Time Calculation (min) 39 min   Activity Tolerance Patient tolerated treatment well   Behavior During Therapy [facility] for tasks assessed/performed      Past Medical History  Diagnosis Date  . GERD (gastroesophageal reflux disease)   . Hyperlipidemia   . Hypertension   . Arthritis   . PMR (polymyalgia rheumatica) (HCC)   . Knee pain   . Osteoporosis   . H/O hiatal hernia     "FOR YEARS" " NO PROBLEM"  . Neuropathy (HCC)     PERIPHERAL   . Polymyalgia ([place]) 5 YEARS    Past Surgical History  Procedure Laterality Date  . Tonsillectomy    . Carpal tunnel release    . Varicose vein surgery    . Cataract extraction      right  . Inguinal hernia repair  10/14/2011    Procedure: HERNIA REPAIR INGUINAL ADULT;  Surgeon: [name], MD;  Location: [place];  Service: General;  Laterality: Right;  Right Inguinal Hernia Repair with Mesh    There were no vitals filed for this visit.  Visit Diagnosis:  Arthralgia of right lower leg - Plan: PT plan of care cert/re-cert  Abnormality of gait - Plan: PT plan of care cert/re-cert  Difficulty walking - Plan: PT plan of care cert/re-cert      Subjective Assessment - 03/07/15 0842    Subjective Pt presents to PT with onset of Rt LE pain that began after a fall.  [place]15.  Pt reports that he passed out and fell due to fatigue.  Pt presents with balance deficits upon entering the clinic with wide base  of support. Pt started taking Prednisone and is now tapering down.  This has helped his pain.    Pertinent History herniated disc in the lumbar spine per pt report-L4/5   Limitations Walking;Standing   How long can you stand comfortably? 10-15 minutes   How long can you walk comfortably? 20 minutes   Diagnostic tests Doppler for DVT: negative, x-ray of Rt tib/fib: negative   Patient Stated Goals reduce leg pain, improve balance   Currently in Pain? Yes   Pain Score 4    Pain Location Leg   Pain Orientation Right   Pain Descriptors / Indicators Sore;Aching   Pain Type Chronic pain   Pain Radiating Towards extends from buttock region to the foot on the Rt   Pain Onset More than a month ago   Pain Frequency Constant   Aggravating Factors  standing and walking, bending   Pain Relieving Factors ignoring it, sitting down            [facility] PT Assessment - 03/07/15 0001    Assessment   Medical Diagnosis Rt lumbar radiculitis   Referring Provider [name], MD   Onset Date/Surgical Date 01/19/15   Next MD Visit 03/10/15   Precautions   Precautions Fall   Restrictions  Weight Bearing Restrictions No   Balance Screen   Has the patient fallen in the past 6 months Yes   How many times? 1  passed out   Has the patient had a decrease in activity level because of a fear of falling?  No   Is the patient reluctant to leave their home because of a fear of falling?  No   Home Environment   Living Environment Private residence   [place] Access Level entry   [place] One level   Prior Function   Level of [place] Retired   Leisure pt travels   Associate Professor   Overall Cognitive Status Within Functional Limits for tasks assessed   Observation/Other Assessments   Focus on Therapeutic Outcomes (FOTO)  56% limitation   Posture/Postural Control   Posture/Postural Control Postural limitations   Postural Limitations Rounded  Shoulders;Forward head;Decreased lumbar lordosis;Posterior pelvic tilt   ROM / Strength   AROM / PROM / Strength AROM;PROM;Strength   AROM   Overall AROM  Unable to assess  balance deficits   PROM   Overall PROM  Deficits   Overall PROM Comments hip IR/ER limited by 50% bilaterally, SLR limited by 25% with Rt calf pain reproduced wiht testing   Strength   Overall Strength Deficits   Overall Strength Comments 4+/5 bilateral LE strength   Palpation   Palpation comment Pt with palpable tenderness over bil lumbar paraspinals (Rt>Lt) and Rt gluteals   Special Tests    Special Tests Lumbar   Lumbar Tests Slump Test;Straight Leg Raise   Slump test   Findings Positive   Side Right   Comment calf pain   Straight Leg Raise   Findings Positive   Side  Right   Comment calf pain   Transfers   Transfers Sit to Stand;Stand to Sit   Sit to Stand 4: Min guard   Stand to Sit 4: Min guard   Comments max UE support with sit to stand and needed to stabilize after standing   Ambulation/Gait   Ambulation/Gait Yes   Ambulation/Gait Assistance 6: Modified independent (Device/Increase time)   Ambulation Distance (Feet) 100 Feet   Gait Pattern Step-through pattern;Decreased stride length;Wide base of support   Balance   Balance Assessed Yes   Standardized Balance Assessment   Standardized Balance Assessment Timed Up and Go Test   Timed Up and Go Test   TUG Normal TUG   Normal TUG (seconds) 27                           PT Education - 03/07/15 0921    Education provided Yes   Education Details HEP: seated strength, LTR, single knee to chest   Person(s) Educated Patient   Methods Explanation;Demonstration;Handout   Comprehension Verbalized understanding;Returned demonstration          PT Short Term Goals - 03/07/15 0936    PT SHORT TERM GOAL #1   Title be independent in initial HEP   Time 4   Period Weeks   Status New   PT SHORT TERM GOAL #2   Title perform TUG in  < or = to 20 seconds to improve balance   Time 4   Period Weeks   Status New   PT SHORT TERM GOAL #3   Title report a 25% reduction in Rt LE pain with standing tasks   Time 4   Period Weeks  Status New   PT SHORT TERM GOAL #4   Title stand and walk for 20 minutes without limitation due to pain   Time 4   Period Weeks   Status New           PT Long Term Goals - 04/01/2015 0848    PT LONG TERM GOAL #1   Title be independent in advanced HEP   Time 8   Period Weeks   Status New   PT LONG TERM GOAL #2   Title reduce FOTO to < or = to 35% limitation   Time 8   Period Weeks   Status New   PT LONG TERM GOAL #3   Title perform TUG in < or = to 18 seconds to reduce risk of falls   Time 8   Period Weeks   Status New   PT LONG TERM GOAL #4   Title perform sit to stand and demonstrate stability upon standing   Time 8   Period Weeks   Status New   PT LONG TERM GOAL #5   Title report a 50% reduction in Rt LE pain with standing and walking   Time 8   Period Weeks   Status New               Plan - 04/01/15 [id number]    Clinical Impression Statement Pt presents to PT with Rt LE pain extending from buttock to foot.  Pt fell after passing out 12/2014 while at a national park in [place].  Pt also demonstrates significant balance deficits and gait abnormality. FOTO score is 56% limitation and is limited to standing and walking <15 minutes. Pt with + slump and SLR on the Rt .  TUG is 27 seconds.  Pt demonstrates wide base of support, short step length and increased medial lateral trunk motion with gait on level surface.  Max UE support and supervision needed with sit to stand.  Pt with instability upon standing and uses legs on back of chair to stabilize.  Pt plans to take a trip to [place] next month and  would benefit from PT for balance training and pain management for Rt LE pain.     Pt will benefit from skilled therapeutic intervention in order to improve on the following deficits  Abnormal gait;Difficulty walking;Pain;Postural dysfunction;Decreased balance;Decreased activity tolerance   Rehab Potential Good   PT Frequency 2x / week   PT Duration 8 weeks   PT Treatment/Interventions ADLs/Self Care Home Management;Cryotherapy;Electrical Stimulation;Moist Heat;Ultrasound;Gait training;Functional mobility training;Therapeutic activities;Therapeutic exercise;Balance training;Neuromuscular re-education;Patient/family education;Manual techniques;Passive range of motion   PT Next Visit Plan work on sit to stand, flexibility for bil. hips, fall prevention education, balance exercises, pain management   Consulted and Agree with Plan of Care Patient          G-Codes - 01-Apr-2015 0848    Functional Assessment Tool Used FOTO: 56% limitation   Functional Limitation Other PT primary   Other PT Primary Current Status [id number]) At least 40 percent but less than 60 percent impaired, limited or restricted   Other PT Primary Goal Status [id number]) At least 20 percent but less than 40 percent impaired, limited or restricted       Problem List Patient Active Problem List   Diagnosis Date Noted  . Leg edema, right 02/20/2015  . BPH (benign prostatic hypertrophy) 12/09/2014  . Chronic radicular low back pain 08/06/2012  . COPD (chronic obstructive pulmonary disease) ([place]) 04/21/2012  . Hyponatremia 03/29/2012  .  Localized osteoarthrosis not specified whether primary or secondary, lower leg 06/20/2010  . VARICOSE VEINS LOWER EXTREMITIES W/INFLAMMATION 02/12/2010  . [place] LOCALIZED OSTEOARTHROSIS INVOLVING HAND 10/16/2009  . DUPUYTREN'S CONTRACTURE, RIGHT 07/04/2009  . DEGENERATIVE DISC DISEASE, LUMBOSACRAL SPINE W/RADICULOPATHY 06/19/2009  . CARCINOMA, BASAL CELL 02/07/2009  . ANEMIA OF CHRONIC DISEASE 02/07/2009  . BURSITIS, RIGHT SHOULDER 10/07/2008  . CATARACT ASSOCIATED WITH OTHER SYNDROMES 08/01/2008  . DRY SKIN 08/01/2008  . CERUMEN IMPACTION, BILATERAL 03/01/2008  . CRAMP IN LIMB  01/18/2008  . DEPRESSION, SITUATIONAL, ACUTE 12/28/2007  . CONSTIPATION, DRUG INDUCED 12/28/2007  . POLYNEUROPATHY OTHER DISEASES CLASSIFIED ELSW 09/24/2007  . WEIGHT LOSS, ABNORMAL 01/07/2007  . Hyperlipidemia 09/22/2006  . Essential hypertension 09/22/2006  . GERD 09/22/2006  . OSTEOARTHRITIS 09/22/2006    Nathalee Smarr, PT 03/07/2015, 9:52 AM  [facility] [address] [place], [place], [address] Phone: [phone]   Fax:  [phone]  Name: Darin [name] MRN: [id number] Date of Birth: Jan 18, 1932

## 2015-03-09 ENCOUNTER — Ambulatory Visit: Payer: Medicare Other | Admitting: Physical Therapy

## 2015-03-09 DIAGNOSIS — R269 Unspecified abnormalities of gait and mobility: Secondary | ICD-10-CM

## 2015-03-09 DIAGNOSIS — M25561 Pain in right knee: Secondary | ICD-10-CM | POA: Diagnosis not present

## 2015-03-09 DIAGNOSIS — R262 Difficulty in walking, not elsewhere classified: Secondary | ICD-10-CM | POA: Diagnosis not present

## 2015-03-09 NOTE — Therapy (Signed)
[facility] [address] [place], [place], [address] Phone: [phone]   Fax:  [phone]  Physical Therapy Treatment  Patient Details  Name: Darin [name] MRN: [id number] Date of Birth: Oct 09, 1931 Referring Provider: [name], MD  Encounter Date: 03/09/2015      PT End of Session - 03/09/15 0845    Visit Number 2   Number of Visits 10   Date for PT Re-Evaluation 05/02/15   PT Start Time 0755   PT Stop Time 0845   PT Time Calculation (min) 50 min   Activity Tolerance Patient tolerated treatment well   Behavior During Therapy [facility] for tasks assessed/performed      Past Medical History  Diagnosis Date  . GERD (gastroesophageal reflux disease)   . Hyperlipidemia   . Hypertension   . Arthritis   . PMR (polymyalgia rheumatica) (HCC)   . Knee pain   . Osteoporosis   . H/O hiatal hernia     "FOR YEARS" " NO PROBLEM"  . Neuropathy (HCC)     PERIPHERAL   . Polymyalgia ([place]) 5 YEARS    Past Surgical History  Procedure Laterality Date  . Tonsillectomy    . Carpal tunnel release    . Varicose vein surgery    . Cataract extraction      right  . Inguinal hernia repair  10/14/2011    Procedure: HERNIA REPAIR INGUINAL ADULT;  Surgeon: [name], MD;  Location: [place];  Service: General;  Laterality: Right;  Right Inguinal Hernia Repair with Mesh    There were no vitals filed for this visit.  Visit Diagnosis:  Arthralgia of right lower leg  Abnormality of gait  Difficulty walking      Subjective Assessment - 03/09/15 0800    Subjective Pt states he has performed stretches and says he feels he is improving.  States he is willing to work hard as he has a tripped planned in early January.   Pertinent History herniated disc in the lumbar spine per pt report-L4/5   Limitations Walking;Standing   How long can you stand comfortably? 10-15 minutes   How long can you walk comfortably? 20 minutes    Diagnostic tests Doppler for DVT: negative, x-ray of Rt tib/fib: negative   Patient Stated Goals reduce leg pain, improve balance   Currently in Pain? Yes   Pain Score 4    Pain Location Leg   Pain Orientation Right   Pain Descriptors / Indicators Aching   Pain Type Chronic pain                         [facility] Adult PT Treatment/Exercise - 03/09/15 0001    High Level Balance   High Level Balance Comments standing tap ups with 1 UE support 2 x 10   Lumbar Exercises: Stretches   Active Hamstring Stretch 2 reps;30 seconds  seated   Single Knee to Chest Stretch 2 reps;30 seconds   Lower Trunk Rotation 5 reps;10 seconds   Piriformis Stretch 2 reps;30 seconds  seated   Lumbar Exercises: Aerobic   Stationary Bike nustep level 1 x 5 mins   Lumbar Exercises: Standing   Other Standing Lumbar Exercises standing trunk extension x 10, no change in LE symptoms   Lumbar Exercises: Supine   Ab Set 10 reps;2 seconds  tactile cuing, needs frequent cuing and practice   Knee/Hip Exercises: Standing   Heel Raises 2 sets;10 reps  and toe raises   Knee Flexion Both;2 sets;10 reps  1.5#   Hip Abduction Both;2 sets;10 reps  1.5#   Other Standing Knee Exercises mini squats x 12   Knee/Hip Exercises: Seated   Sit to Sand 10 reps;with UE support   Manual Therapy   Manual Therapy Soft tissue mobilization   Soft tissue mobilization lumbar parpaspinals very TTP, no piriformis tightness palpable today                PT Education - 03/09/15 0845    Education provided Yes   Education Details HEP   Person(s) Educated Patient   Methods Explanation;Demonstration;Handout   Comprehension Returned demonstration;Verbalized understanding          PT Short Term Goals - 03/09/15 0846    PT SHORT TERM GOAL #1   Title be independent in initial HEP   Time 4   Period Weeks   Status New   PT SHORT TERM GOAL #2   Title perform TUG in < or = to 20 seconds to improve balance    Time 4   Period Weeks   Status New   PT SHORT TERM GOAL #3   Title report a 25% reduction in Rt LE pain with standing tasks   Time 4   Period Weeks   Status New   PT SHORT TERM GOAL #4   Title stand and walk for 20 minutes without limitation due to pain   Time 4   Period Weeks   Status New           PT Long Term Goals - 03/09/15 0848    PT LONG TERM GOAL #1   Title be independent in advanced HEP   Time 8   Period Weeks   Status New   PT LONG TERM GOAL #2   Title reduce FOTO to < or = to 35% limitation   Time 8   Period Weeks   Status New   PT LONG TERM GOAL #3   Title perform TUG in < or = to 18 seconds to reduce risk of falls   Time 8   Period Weeks   Status New   PT LONG TERM GOAL #4   Title perform sit to stand and demonstrate stability upon standing   Time 8   Period Weeks   Status New   PT LONG TERM GOAL #5   Title report a 50% reduction in Rt LE pain with standing and walking   Time 8   Period Weeks   Status New               Plan - 03/09/15 0845    Clinical Impression Statement Pt with difficulty with ab set, needs practice. Tolerated added strenghtening well. No change in symptoms with prone on elbows or standing trunk extensions.   Pt will benefit from skilled therapeutic intervention in order to improve on the following deficits Abnormal gait;Difficulty walking;Pain;Postural dysfunction;Decreased balance;Decreased activity tolerance   Rehab Potential Good   PT Frequency 2x / week   PT Duration 8 weeks   PT Treatment/Interventions ADLs/Self Care Home Management;Cryotherapy;Electrical Stimulation;Moist Heat;Ultrasound;Gait training;Functional mobility training;Therapeutic activities;Therapeutic exercise;Balance training;Neuromuscular re-education;Patient/family education;Manual techniques;Passive range of motion   PT Next Visit Plan manual, modailities, balance, flexibility   Consulted and Agree with Plan of Care Patient        Problem  List Patient Active Problem List   Diagnosis Date Noted  . Leg edema, right 02/20/2015  . BPH (benign prostatic  hypertrophy) 12/09/2014  . Chronic radicular low back pain 08/06/2012  . COPD (chronic obstructive pulmonary disease) ([place]) 04/21/2012  . Hyponatremia 03/29/2012  . Localized osteoarthrosis not specified whether primary or secondary, lower leg 06/20/2010  . VARICOSE VEINS LOWER EXTREMITIES W/INFLAMMATION 02/12/2010  . [place] LOCALIZED OSTEOARTHROSIS INVOLVING HAND 10/16/2009  . DUPUYTREN'S CONTRACTURE, RIGHT 07/04/2009  . DEGENERATIVE DISC DISEASE, LUMBOSACRAL SPINE W/RADICULOPATHY 06/19/2009  . CARCINOMA, BASAL CELL 02/07/2009  . ANEMIA OF CHRONIC DISEASE 02/07/2009  . BURSITIS, RIGHT SHOULDER 10/07/2008  . CATARACT ASSOCIATED WITH OTHER SYNDROMES 08/01/2008  . DRY SKIN 08/01/2008  . CERUMEN IMPACTION, BILATERAL 03/01/2008  . CRAMP IN LIMB 01/18/2008  . DEPRESSION, SITUATIONAL, ACUTE 12/28/2007  . CONSTIPATION, DRUG INDUCED 12/28/2007  . POLYNEUROPATHY OTHER DISEASES CLASSIFIED ELSW 09/24/2007  . WEIGHT LOSS, ABNORMAL 01/07/2007  . Hyperlipidemia 09/22/2006  . Essential hypertension 09/22/2006  . GERD 09/22/2006  . OSTEOARTHRITIS 09/22/2006    Isabelle Course, PT, DPT  03/09/2015, 8:49 AM  [facility] [address] [place], [place], [address] Phone: [phone]   Fax:  [phone]  Name: Phill Mathwig MRN: [id number] Date of Birth: [birth year]

## 2015-03-09 NOTE — Patient Instructions (Signed)
ABDUCTION: Standing - Resistance Band (Active)   Stand, feet flat. Against yellow resistance band, lift right leg out to side. Complete ___ sets of ___ repetitions. Perform ___ sessions per day.   Strengthening: Hip Extension - Resisted   With tubing around right ankle, face anchor and pull leg straight back. Repeat ____ times per set. Do ____ sets per session. Do ____ sessions per day.       Lower Trunk Rotation Stretch   Keeping back flat and feet together, rotate knees to left side. Hold ___30_ seconds. Repeat __3_ times per set. . Do __2__ sessions per day.  http://orth.exer.us/122   Isometric Abdominal   Lying on back with knees bent, tighten stomach by pressing elbows down. Hold ____ seconds. Repeat ____ times per set. Do ____ sets per session. Do ____ sessions per day.  [facility] [address] [place], [place] [address] Phone # [phone] Fax [phone]

## 2015-03-10 ENCOUNTER — Ambulatory Visit (INDEPENDENT_AMBULATORY_CARE_PROVIDER_SITE_OTHER): Payer: Medicare Other | Admitting: Family Medicine

## 2015-03-10 ENCOUNTER — Encounter: Payer: Self-pay | Admitting: Family Medicine

## 2015-03-10 VITALS — BP 138/60 | HR 86 | Temp 97.7°F | Resp 16 | Ht 73.0 in | Wt 219.3 lb

## 2015-03-10 DIAGNOSIS — Z7189 Other specified counseling: Secondary | ICD-10-CM

## 2015-03-10 DIAGNOSIS — M5410 Radiculopathy, site unspecified: Secondary | ICD-10-CM | POA: Diagnosis not present

## 2015-03-10 DIAGNOSIS — Z7184 Encounter for health counseling related to travel: Secondary | ICD-10-CM

## 2015-03-10 DIAGNOSIS — I10 Essential (primary) hypertension: Secondary | ICD-10-CM | POA: Diagnosis not present

## 2015-03-10 DIAGNOSIS — M5416 Radiculopathy, lumbar region: Secondary | ICD-10-CM

## 2015-03-10 DIAGNOSIS — G8929 Other chronic pain: Secondary | ICD-10-CM | POA: Diagnosis not present

## 2015-03-10 MED ORDER — ONDANSETRON 8 MG PO TBDP: 8.0000 mg | ORAL_TABLET | Freq: Three times a day (TID) | ORAL | Status: DC | PRN

## 2015-03-10 MED ORDER — TYPHOID VACCINE PO CPDR: 1.0000 | DELAYED_RELEASE_CAPSULE | ORAL | Status: DC

## 2015-03-10 MED ORDER — CIPROFLOXACIN HCL 500 MG PO TABS
ORAL_TABLET | ORAL | Status: DC
Start: 2015-03-10 — End: 2015-06-15

## 2015-03-10 NOTE — Progress Notes (Signed)
   Subjective:    Patient ID: Darin [name], male    DOB: 1932-02-14, 81 y.o.   MRN: [id number]  HPI Recent low back pain and right lower extremity pain following fall. Improving following recent prednisone in physical therapy. He is ambulating better. No recurrent falls since then. Denies lower extremity focal weakness.  Upcoming trip to [place]. Has had previous hepatitis A vaccination. He declines tetanus. He's had typhoid vaccine but this was over 5 years ago Has had flu vaccine.  Hypertension which has been well controlled. No recent orthostasis. Compliant with medications. Denies chest pains. Recent electrolytes were stable. BPH treated with finasteride. No recent obstructive urinary symptoms.  Past Medical History  Diagnosis Date  . GERD (gastroesophageal reflux disease)   . Hyperlipidemia   . Hypertension   . Arthritis   . PMR (polymyalgia rheumatica) (HCC)   . Knee pain   . Osteoporosis   . H/O hiatal hernia     "FOR YEARS" " NO PROBLEM"  . Neuropathy (HCC)     PERIPHERAL   . Polymyalgia ([place]) 5 YEARS   Past Surgical History  Procedure Laterality Date  . Tonsillectomy    . Carpal tunnel release    . Varicose vein surgery    . Cataract extraction      right  . Inguinal hernia repair  10/14/2011    Procedure: HERNIA REPAIR INGUINAL ADULT;  Surgeon: [name], MD;  Location: [facility];  Service: General;  Laterality: Right;  Right Inguinal Hernia Repair with Mesh    reports that he has never smoked. He has never used smokeless tobacco. He reports that he drinks alcohol. He reports that he does not use illicit drugs. family history includes Heart disease in his father. Allergies  Allergen Reactions  . Statins Other (See Comments)    Muscle pain and upset stomach      Review of Systems  Constitutional: Negative for fatigue and unexpected weight change.  Eyes: Negative for visual disturbance.  Respiratory: Negative for cough, chest tightness and shortness  of breath.   Cardiovascular: Negative for chest pain, palpitations and leg swelling.  Gastrointestinal: Negative for abdominal pain.  Endocrine: Negative for polydipsia and polyuria.  Genitourinary: Negative for dysuria.  Neurological: Negative for dizziness, syncope, weakness, light-headedness and headaches.  Psychiatric/Behavioral: Negative for dysphoric mood.       Objective:   Physical Exam  Constitutional: He is oriented to person, place, and time. He appears well-developed and well-nourished.  Neck: Neck supple. No JVD present.  Cardiovascular: Normal rate and regular rhythm.   Pulmonary/Chest: Effort normal and breath sounds normal. No respiratory distress. He has no wheezes. He has no rales.  Musculoskeletal: He exhibits no edema.  Patient is ambulating and transferring with greater ease than on last visit  Neurological: He is alert and oriented to person, place, and time.          Assessment & Plan:  #1 travel medicine consultation. Handout given. Hepatitis A up-to-date. Start Vivotif oral  typhoid vaccination one tablet every other day for 4 doses.  Also provided prescription for Zofran and Cipro as needed for traveler's diarrhea  #2 hypertension stable and at goal. Continue current medications #3 recent low back pain and right lower extremity radiculitis symptoms improving with prednisone and physical therapy. Continue PT

## 2015-03-10 NOTE — Patient Instructions (Signed)
Food Poisoning and Traveling Food poisoning is an illness caused by organisms present in something you ate or drank. Some types of food poisoning trigger symptoms quickly. Others may take 1-2 weeks for symptoms to appear. Symptoms of food poisoning include:   Diarrhea.  Cramping.  Fever.  Vomiting.  Dizziness.  Aches and pains. Before you travel, learn as much as you can about the foodborne illnesses that are common in the areas where you are going. The risk for food poisoning varies from country to country and from one region of the world to another.  Countries with a low risk include:   The [place].  [place].  [place].  [place].  [place].  Some countries in [place]. Countries with a mid-range risk include:  Countries in [place].  [place].  Some [place]. Countries with a high risk include:   Countries in [place].  Countries in the [place] in [place].  [place].  Countries in [place] and [place]. WHAT TYPES OF ILLNESS CAN BE PASSED THROUGH FOOD AND DRINKS? Most cases of food poisoning are caused by bacteria or viruses, such as:   E. coli.  Campylobacteriosis.  Shigellosis.  Salmonellosis.  Norovirus.  Rotavirus.  Astrovirus. Food poisoning can also be caused by some microscopic parasites. These are organisms that live off of another larger organism. Illness caused by parasites can take 1-2 weeks to appear and may last several months. The illnesses include:  Giardiasis.  Amebiasis.  Cyclosporiasis.  Cryptosporidiosis. Medicines are available to treat these infections. HOW CAN I DECREASE MY RISK OF FOOD POISONING WHILE TRAVELING? Good hand hygiene always helps protect your health. Carry small bottles of alcohol-based hand sanitizer. Use it to clean your hands before you eat. Also follow these basic guidelines for eating and drinking while traveling. Foods that are generally safe to eat:   Food  that is thoroughly cooked.  Food that is served hot.  Hard-boiled eggs.  Fruits and vegetables you wash and peel yourself.  Milk or cheese that is treated with high heat (pasteurized). Foods to avoid:   Raw or undercooked foods.  Raw or runny eggs.  Food that is not hot (such as food that has been on a buffet or picnic table for a while).  Raw fruits or vegetables that have not been washed and peeled.  Other items made with fresh vegetables or fruits, like salad and salsa.  Milk or cheese that is not pasteurized.  Meat from local animals, such as monkeys and bats. Drinks that are generally safe:  Bottled waters, soda, or sports drinks.  Drinks you know were sealed until you opened them.  Water you know has been treated, boiled, or filtered to remove microorganisms.  Ice from treated or bottled water.  Drinks made with boiling water, such as tea or coffee.  Pasteurized milk. Drinks to avoid:  Water from the tap or a well.  Water from a fresh water source, such as a stream.  Ice from a tap, well, or fresh water source.  Beverages that include water from a well, tap, or fresh water source.  Milk that is not pasteurized.  Beverages from soda fountains. WHAT SHOULD I DO IF I THINK I HAVE DEVELOPED FOOD POISONING WHILE TRAVELING?  If you have been vomiting or have diarrhea, drink water or other fluids to replace what you lost.  Take over-the-counter medicine to stop diarrhea. Consider packing a supply of antidiarrheal medicine to take with you.  Contact your health  care provider if your symptoms do not clear up after a few days. HOW IS FOOD POISONING TREATED? Most cases of food poisoning go away without treatment within 48 hours. Food poisoning caused by bacteria may be treated with antibiotic medicines.Viruses cannot be treated with antibiotics.Illnesses caused by parasites might respond to antiparasitic medicines. You can also treat symptoms of food  poisoning with medicines to:  Prevent or slow diarrhea (antidiarrheals).  Rehydrate your body. Oral rehydration therapy (ORT) helps your body replace fluids and electrolytes lost in diarrhea or vomit.  Treat rashes caused by diarrhea using hydrocortisone cream. SHOULD I BE PROACTIVELY TREATED FOR FOOD POISONING BEFORE I TRAVEL? Talk to your health care provider about your personal risk for contracting food poisoning while traveling. Discuss the foodborne illnesses that are common in the areas you plan to visit. Make sure you understand how to use any medicines your health care provider recommends. Some medicines can help prevent food poisoning. These include:  Bismuth subsalicylate (BSS). This is an ingredient in over-the-counter antidiarrheal medicines. It might help some adult travelers prevent illness.  Probiotics or "good" bacteria. Taking probiotics might help prevent some illnesses.  Antibiotics. These protect against bacteria only. Taking antibiotics to prevent food poisoning is usually suggested only for people who have a compromised immune system. HOW CAN I LEARN MORE?  Visit a travel medicine clinic or speak with a health care provider who specializes in travel medicine as soon as you know your travel plans.  Check the Travelers' Health section on the website of the Centers for Disease Control and Prevention (CDC): WaveHands.com.ee   This information is not intended to replace advice given to you by your health care provider. Make sure you discuss any questions you have with your health care provider.   Document Released: 06/01/2002 Document Revised: 04/01/2014 Document Reviewed: 06/18/2013 Elsevier Interactive Patient Education [employer].

## 2015-03-10 NOTE — Progress Notes (Signed)
Pre visit review using our clinic review tool, if applicable. No additional management support is needed unless otherwise documented below in the visit note. 

## 2015-03-13 ENCOUNTER — Other Ambulatory Visit: Payer: Self-pay | Admitting: Family Medicine

## 2015-03-13 ENCOUNTER — Ambulatory Visit: Payer: Medicare Other | Admitting: Physical Therapy

## 2015-03-15 ENCOUNTER — Encounter: Payer: Self-pay | Admitting: Physical Therapy

## 2015-03-15 ENCOUNTER — Ambulatory Visit: Payer: Medicare Other | Admitting: Physical Therapy

## 2015-03-15 DIAGNOSIS — M25561 Pain in right knee: Secondary | ICD-10-CM

## 2015-03-15 DIAGNOSIS — R269 Unspecified abnormalities of gait and mobility: Secondary | ICD-10-CM

## 2015-03-15 DIAGNOSIS — R262 Difficulty in walking, not elsewhere classified: Secondary | ICD-10-CM

## 2015-03-15 NOTE — Therapy (Signed)
[facility] [address] [place], [place], [address] Phone: [phone]   Fax:  [phone]  Physical Therapy Treatment  Patient Details  Name: Darin [name] MRN: [id number] Date of Birth: 04-16-1931 Referring Provider: [name], MD  Encounter Date: 03/15/2015      PT End of Session - 03/15/15 1059    Visit Number 3   Number of Visits 10   Date for PT Re-Evaluation 05/02/15   PT Start Time 1024  pt arrived late   PT Stop Time 1100   PT Time Calculation (min) 36 min   Activity Tolerance Patient tolerated treatment well   Behavior During Therapy [facility] for tasks assessed/performed      Past Medical History  Diagnosis Date  . GERD (gastroesophageal reflux disease)   . Hyperlipidemia   . Hypertension   . Arthritis   . PMR (polymyalgia rheumatica) (HCC)   . Knee pain   . Osteoporosis   . H/O hiatal hernia     "FOR YEARS" " NO PROBLEM"  . Neuropathy (HCC)     PERIPHERAL   . Polymyalgia ([place]) 5 YEARS    Past Surgical History  Procedure Laterality Date  . Tonsillectomy    . Carpal tunnel release    . Varicose vein surgery    . Cataract extraction      right  . Inguinal hernia repair  10/14/2011    Procedure: HERNIA REPAIR INGUINAL ADULT;  Surgeon: [name], MD;  Location: [place];  Service: General;  Laterality: Right;  Right Inguinal Hernia Repair with Mesh    There were no vitals filed for this visit.  Visit Diagnosis:  Arthralgia of right lower leg  Abnormality of gait  Difficulty walking      Subjective Assessment - 03/15/15 1041    Subjective Pt reports compliance with initial HEP. Pt is using a walking stick to maintain balance with ambulation.   Pertinent History herniated disc in the lumbar spine per pt report-L4/5   Limitations Walking;Standing   How long can you stand comfortably? 10-15 minutes   How long can you walk comfortably? 20 minutes   Diagnostic tests Doppler  for DVT: negative, x-ray of Rt tib/fib: negative   Patient Stated Goals reduce leg pain, improve balance   Currently in Pain? Yes   Pain Score 3    Pain Location Leg   Pain Orientation Right   Pain Descriptors / Indicators Aching   Pain Type Chronic pain   Pain Onset More than a month ago   Pain Frequency Constant   Aggravating Factors  standing and walking, bending   Pain Relieving Factors ignoring it, sitting down   Multiple Pain Sites No                         [facility] Adult PT Treatment/Exercise - 03/15/15 0001    High Level Balance   High Level Balance Comments standing tap ups with 1 UE support 2 x 10   Lumbar Exercises: Stretches   Active Hamstring Stretch 2 reps;30 seconds   Single Knee to Chest Stretch 3 reps;20 seconds   Lower Trunk Rotation 3 reps;20 seconds  needs tactile and verbal cues to improve tehcnique   Piriformis Stretch 3 reps;20 seconds  using towe each sidel   Lumbar Exercises: Aerobic   Stationary Bike nustep level 1 x 8 mins  seat# 12, Arms # 10   Lumbar Exercises: Standing   Other Standing  Lumbar Exercises --   Lumbar Exercises: Supine   Ab Set 10 reps;2 seconds   Knee/Hip Exercises: Standing   Heel Raises 2 sets;10 reps  with B UE support for balance   Knee/Hip Exercises: Seated   Sit to Sand --                  PT Short Term Goals - 03/15/15 1104    PT SHORT TERM GOAL #1   Title be independent in initial HEP   Time 4   Period Weeks   Status On-going   PT SHORT TERM GOAL #3   Title report a 25% reduction in Rt LE pain with standing tasks   Time 4   Period Weeks   Status On-going   PT SHORT TERM GOAL #4   Title stand and walk for 20 minutes without limitation due to pain   Time 4   Period Weeks   Status On-going           PT Long Term Goals - 03/09/15 0848    PT LONG TERM GOAL #1   Title be independent in advanced HEP   Time 8   Period Weeks   Status New   PT LONG TERM GOAL #2   Title reduce FOTO  to < or = to 35% limitation   Time 8   Period Weeks   Status New   PT LONG TERM GOAL #3   Title perform TUG in < or = to 18 seconds to reduce risk of falls   Time 8   Period Weeks   Status New   PT LONG TERM GOAL #4   Title perform sit to stand and demonstrate stability upon standing   Time 8   Period Weeks   Status New   PT LONG TERM GOAL #5   Title report a 50% reduction in Rt LE pain with standing and walking   Time 8   Period Weeks   Status New               Plan - 03/15/15 1101    Clinical Impression Statement Pt with staggering gait. pt able to perform stretching and strengthening in PT clinic but needs vc's for technique. Pt will continue to benefit from skilled PT to improve pain in right leg and improve strength and balance for safe ambulation.   Pt will benefit from skilled therapeutic intervention in order to improve on the following deficits Abnormal gait;Difficulty walking;Pain;Postural dysfunction;Decreased balance;Decreased activity tolerance   Rehab Potential Good   PT Frequency 2x / week   PT Duration 8 weeks   PT Treatment/Interventions ADLs/Self Care Home Management;Cryotherapy;Electrical Stimulation;Moist Heat;Ultrasound;Gait training;Functional mobility training;Therapeutic activities;Therapeutic exercise;Balance training;Neuromuscular re-education;Patient/family education;Manual techniques;Passive range of motion   PT Next Visit Plan manual, balance, flexibility   Consulted and Agree with Plan of Care Patient        Problem List Patient Active Problem List   Diagnosis Date Noted  . Leg edema, right 02/20/2015  . BPH (benign prostatic hypertrophy) 12/09/2014  . Chronic radicular low back pain 08/06/2012  . COPD (chronic obstructive pulmonary disease) ([place]) 04/21/2012  . Hyponatremia 03/29/2012  . Localized osteoarthrosis not specified whether primary or secondary, lower leg 06/20/2010  . VARICOSE VEINS LOWER EXTREMITIES W/INFLAMMATION  02/12/2010  . [place] LOCALIZED OSTEOARTHROSIS INVOLVING HAND 10/16/2009  . DUPUYTREN'S CONTRACTURE, RIGHT 07/04/2009  . DEGENERATIVE DISC DISEASE, LUMBOSACRAL SPINE W/RADICULOPATHY 06/19/2009  . CARCINOMA, BASAL CELL 02/07/2009  . ANEMIA OF CHRONIC DISEASE 02/07/2009  .  BURSITIS, RIGHT SHOULDER 10/07/2008  . CATARACT ASSOCIATED WITH OTHER SYNDROMES 08/01/2008  . DRY SKIN 08/01/2008  . CERUMEN IMPACTION, BILATERAL 03/01/2008  . CRAMP IN LIMB 01/18/2008  . DEPRESSION, SITUATIONAL, ACUTE 12/28/2007  . CONSTIPATION, DRUG INDUCED 12/28/2007  . POLYNEUROPATHY OTHER DISEASES CLASSIFIED ELSW 09/24/2007  . WEIGHT LOSS, ABNORMAL 01/07/2007  . Hyperlipidemia 09/22/2006  . Essential hypertension 09/22/2006  . GERD 09/22/2006  . OSTEOARTHRITIS 09/22/2006    NAUMANN-HOUEGNIFIO,Ercel Normoyle PTA 03/15/2015, 11:08 AM  [facility] [address] [place], [place], [address] Phone: [phone]   Fax:  [phone]  Name: Darin [name] MRN: [id number] Date of Birth: Oct 18, 1931

## 2015-03-16 ENCOUNTER — Ambulatory Visit: Payer: Medicare Other

## 2015-03-16 DIAGNOSIS — R262 Difficulty in walking, not elsewhere classified: Secondary | ICD-10-CM

## 2015-03-16 DIAGNOSIS — M25561 Pain in right knee: Secondary | ICD-10-CM

## 2015-03-16 DIAGNOSIS — R269 Unspecified abnormalities of gait and mobility: Secondary | ICD-10-CM | POA: Diagnosis not present

## 2015-03-16 NOTE — Therapy (Signed)
[facility] [address] [place], [place], [address] Phone: [phone]   Fax:  [phone]  Physical Therapy Treatment  Patient Details  Name: Darin [name] MRN: [id number] Date of Birth: June 22, 1931 Referring Provider: [name], MD  Encounter Date: 03/16/2015      PT End of Session - 03/16/15 0830    Visit Number 4   Number of Visits 10   Date for PT Re-Evaluation 05/02/15   PT Start Time 0800   PT Stop Time 0844   PT Time Calculation (min) 44 min   Activity Tolerance Patient tolerated treatment well   Behavior During Therapy [facility] for tasks assessed/performed      Past Medical History  Diagnosis Date  . GERD (gastroesophageal reflux disease)   . Hyperlipidemia   . Hypertension   . Arthritis   . PMR (polymyalgia rheumatica) (HCC)   . Knee pain   . Osteoporosis   . H/O hiatal hernia     "FOR YEARS" " NO PROBLEM"  . Neuropathy (HCC)     PERIPHERAL   . Polymyalgia ([place]) 5 YEARS    Past Surgical History  Procedure Laterality Date  . Tonsillectomy    . Carpal tunnel release    . Varicose vein surgery    . Cataract extraction      right  . Inguinal hernia repair  10/14/2011    Procedure: HERNIA REPAIR INGUINAL ADULT;  Surgeon: [name], MD;  Location: [place];  Service: General;  Laterality: Right;  Right Inguinal Hernia Repair with Mesh    There were no vitals filed for this visit.  Visit Diagnosis:  Arthralgia of right lower leg  Abnormality of gait  Difficulty walking      Subjective Assessment - 03/16/15 0808    Subjective Pt is using walking stick today.  Walked a couple of blocks (~15 minutes) yesterday afternoon.     Currently in Pain? Yes   Pain Score 3    Pain Location Leg   Pain Orientation Right   Pain Descriptors / Indicators Aching   Pain Type Chronic pain   Pain Onset More than a month ago   Pain Frequency Constant                          [facility] Adult PT Treatment/Exercise - 03/16/15 0001    Lumbar Exercises: Stretches   Active Hamstring Stretch 2 reps;30 seconds   Single Knee to Chest Stretch 3 reps;20 seconds   Lower Trunk Rotation 3 reps;20 seconds  needs tactile and verbal cues to improve tehcnique   Piriformis Stretch 3 reps;20 seconds  using towe each sidel   Lumbar Exercises: Aerobic   Stationary Bike nustep level 1 x 8 mins  seat# 12, Arms # 10   Knee/Hip Exercises: Standing   Heel Raises 2 sets;10 reps  with B UE support for balance   Hip Flexion Stengthening;2 sets;10 reps;Both;Knee bent   Hip Abduction Both;2 sets;10 reps  1.5#   Hip Extension Stengthening;Both;2 sets;10 reps                PT Education - 03/16/15 0820    Education provided Yes   Education Details HEP: figure 4, seated hamstring stretch    Person(s) Educated Patient   Methods Explanation;Demonstration;Handout   Comprehension Verbalized understanding;Returned demonstration          PT Short Term Goals - 03/15/15 1104    PT SHORT TERM  GOAL #1   Title be independent in initial HEP   Time 4   Period Weeks   Status On-going   PT SHORT TERM GOAL #3   Title report a 25% reduction in Rt LE pain with standing tasks   Time 4   Period Weeks   Status On-going   PT SHORT TERM GOAL #4   Title stand and walk for 20 minutes without limitation due to pain   Time 4   Period Weeks   Status On-going           PT Long Term Goals - 03/09/15 0848    PT LONG TERM GOAL #1   Title be independent in advanced HEP   Time 8   Period Weeks   Status New   PT LONG TERM GOAL #2   Title reduce FOTO to < or = to 35% limitation   Time 8   Period Weeks   Status New   PT LONG TERM GOAL #3   Title perform TUG in < or = to 18 seconds to reduce risk of falls   Time 8   Period Weeks   Status New   PT LONG TERM GOAL #4   Title perform sit to stand and demonstrate stability upon standing   Time 8   Period Weeks   Status New   PT LONG  TERM GOAL #5   Title report a 50% reduction in Rt LE pain with standing and walking   Time 8   Period Weeks   Status New               Plan - 03/16/15 [id number]    Clinical Impression Statement Pt with continued gait abmormality and instability.  Pt is able to perform all exercises in the clinic but needs verbal cues for safety and technique.  Pt will continue to benefit from skilled PT to improve pain in the Rt leg and improve strength and balance for safe ambulation.     Pt will benefit from skilled therapeutic intervention in order to improve on the following deficits Abnormal gait;Difficulty walking;Pain;Postural dysfunction;Decreased balance;Decreased activity tolerance   Rehab Potential Good   PT Frequency 2x / week   PT Duration 8 weeks   PT Treatment/Interventions ADLs/Self Care Home Management;Cryotherapy;Electrical Stimulation;Moist Heat;Ultrasound;Gait training;Functional mobility training;Therapeutic activities;Therapeutic exercise;Balance training;Neuromuscular re-education;Patient/family education;Manual techniques;Passive range of motion   PT Next Visit Plan manual, balance, flexibility.  Add standing hip exercises to HEP   Consulted and Agree with Plan of Care Patient        Problem List Patient Active Problem List   Diagnosis Date Noted  . Leg edema, right 02/20/2015  . BPH (benign prostatic hypertrophy) 12/09/2014  . Chronic radicular low back pain 08/06/2012  . COPD (chronic obstructive pulmonary disease) ([place]) 04/21/2012  . Hyponatremia 03/29/2012  . Localized osteoarthrosis not specified whether primary or secondary, lower leg 06/20/2010  . VARICOSE VEINS LOWER EXTREMITIES W/INFLAMMATION 02/12/2010  . [place] LOCALIZED OSTEOARTHROSIS INVOLVING HAND 10/16/2009  . DUPUYTREN'S CONTRACTURE, RIGHT 07/04/2009  . DEGENERATIVE DISC DISEASE, LUMBOSACRAL SPINE W/RADICULOPATHY 06/19/2009  . CARCINOMA, BASAL CELL 02/07/2009  . ANEMIA OF CHRONIC DISEASE 02/07/2009  .  BURSITIS, RIGHT SHOULDER 10/07/2008  . CATARACT ASSOCIATED WITH OTHER SYNDROMES 08/01/2008  . DRY SKIN 08/01/2008  . CERUMEN IMPACTION, BILATERAL 03/01/2008  . CRAMP IN LIMB 01/18/2008  . DEPRESSION, SITUATIONAL, ACUTE 12/28/2007  . CONSTIPATION, DRUG INDUCED 12/28/2007  . POLYNEUROPATHY OTHER DISEASES CLASSIFIED ELSW 09/24/2007  . WEIGHT LOSS, ABNORMAL 01/07/2007  .  Hyperlipidemia 09/22/2006  . Essential hypertension 09/22/2006  . GERD 09/22/2006  . OSTEOARTHRITIS 09/22/2006    Jayven Naill, PT 03/16/2015, 8:45 AM  [facility] [address], [place], [place], [address] Phone: [phone]   Fax:  [phone]  Name: Darin [name] MRN: [id number] Date of Birth: 14-Dec-1931

## 2015-03-16 NOTE — Patient Instructions (Signed)
Piriformis (Supine)    Cross legs, right on top. Gently pull other knee toward chest until stretch is felt in buttock/hip of top leg. Hold _20___ seconds. Repeat __3__ times per set. Do __1__ sets per session. Do __3__ sessions per day.  http://orth.exer.us/676   Copyright  VHI. All rights reserved.  HIP: Hamstrings - Short Sitting    Rest leg on raised surface. Keep knee straight. Lift chest. Hold _20_ seconds. _3__ reps per set, _3__ sets per day  Copyright  VHI. All rights reserved.   Straight Leg Raise    Tighten stomach and slowly raise locked right leg ____ inches from floor. Repeat _10___ times per set. Do 1-2____ sets per session. Do 2____ sessions per day.  http://orth.exer.us/1102   Copyright  VHI. All rights reserved.   [facility] [address], [place], [place] [address] Phone # [phone] Fax [phone]

## 2015-03-21 ENCOUNTER — Encounter: Payer: Self-pay | Admitting: Podiatry

## 2015-03-21 ENCOUNTER — Ambulatory Visit (INDEPENDENT_AMBULATORY_CARE_PROVIDER_SITE_OTHER): Payer: Medicare Other | Admitting: Podiatry

## 2015-03-21 DIAGNOSIS — M79676 Pain in unspecified toe(s): Secondary | ICD-10-CM

## 2015-03-21 DIAGNOSIS — B351 Tinea unguium: Secondary | ICD-10-CM

## 2015-03-21 NOTE — Progress Notes (Signed)
Patient ID: Darin [name], male   DOB: 03/13/1932, 81 y.o.   MRN: [id number]  Subjective: This patient presents for scheduled visit complaining of painful toenails when walking wearing shoes and requests nail debridement  Objective: Orientated 3 No open skin lesions noted bilaterally The toenails are incurvated, hypertrophic, discolored and tender to direct palpation 6-10  Assessment: Symptomatic onychomycoses 6-10  Plan: Debridement toenails 10 and mechanically and electrically without any bleeding  Reappoint 3 months 

## 2015-03-22 ENCOUNTER — Encounter: Payer: Self-pay | Admitting: Physical Therapy

## 2015-03-22 ENCOUNTER — Ambulatory Visit: Payer: Medicare Other | Admitting: Physical Therapy

## 2015-03-22 DIAGNOSIS — R262 Difficulty in walking, not elsewhere classified: Secondary | ICD-10-CM

## 2015-03-22 DIAGNOSIS — M25561 Pain in right knee: Secondary | ICD-10-CM

## 2015-03-22 DIAGNOSIS — R269 Unspecified abnormalities of gait and mobility: Secondary | ICD-10-CM | POA: Diagnosis not present

## 2015-03-22 NOTE — Therapy (Signed)
[facility] [address] [place], [place], [address] Phone: [phone]   Fax:  [phone]  Physical Therapy Treatment  Patient Details  Name: Darin [name] MRN: [id number] Date of Birth: 04/08/1931 Referring Provider: [name], MD  Encounter Date: 03/22/2015      PT End of Session - 03/22/15 1021    Visit Number 5   Number of Visits 10   Date for PT Re-Evaluation 05/02/15   PT Start Time 1010   PT Stop Time 1055   PT Time Calculation (min) 45 min   Activity Tolerance Patient tolerated treatment well   Behavior During Therapy [facility] for tasks assessed/performed      Past Medical History  Diagnosis Date  . GERD (gastroesophageal reflux disease)   . Hyperlipidemia   . Hypertension   . Arthritis   . PMR (polymyalgia rheumatica) (HCC)   . Knee pain   . Osteoporosis   . H/O hiatal hernia     "FOR YEARS" " NO PROBLEM"  . Neuropathy (HCC)     PERIPHERAL   . Polymyalgia ([place]) 5 YEARS    Past Surgical History  Procedure Laterality Date  . Tonsillectomy    . Carpal tunnel release    . Varicose vein surgery    . Cataract extraction      right  . Inguinal hernia repair  10/14/2011    Procedure: HERNIA REPAIR INGUINAL ADULT;  Surgeon: [name], MD;  Location: [place];  Service: General;  Laterality: Right;  Right Inguinal Hernia Repair with Mesh    There were no vitals filed for this visit.  Visit Diagnosis:  Arthralgia of right lower leg  Abnormality of gait  Difficulty walking      Subjective Assessment - 03/22/15 1013    Subjective Pt is using his walking stick, he reports has more confidence with walking since start of PT. Pt reports his cramping in calf is improving, but still present especially during the night.   Pertinent History herniated disc in the lumbar spine per pt report-L4/5   Limitations Walking;Standing   How long can you stand comfortably? 10-15 minutes   How long  can you walk comfortably? 20 minutes   Diagnostic tests Doppler for DVT: negative, x-ray of Rt tib/fib: negative   Patient Stated Goals reduce leg pain, improve balance   Currently in Pain? Yes   Pain Score 2    Pain Location Leg   Pain Orientation Right   Pain Descriptors / Indicators Aching;Sore;Cramping   Pain Type Chronic pain   Pain Onset More than a month ago   Pain Frequency Constant   Aggravating Factors  standing, walking, bending, during the nicht increase cramping   Pain Relieving Factors ignoring it, sitting down   Multiple Pain Sites No                         [facility] Adult PT Treatment/Exercise - 03/22/15 0001    High Level Balance   High Level Balance Comments standing tap ups with 1 UE support 2 x 10   Lumbar Exercises: Stretches   Active Hamstring Stretch 2 reps;30 seconds  on stairs   Single Knee to Chest Stretch 3 reps;20 seconds   Lower Trunk Rotation 3 reps;20 seconds  need reminders to improve technique   Piriformis Stretch 2 reps;30 seconds  in sittifng   Lumbar Exercises: Aerobic   Stationary Bike nustep level 1 x 8 mins  seat#12,  arms #10   Knee/Hip Exercises: Stretches   Other Knee/Hip Stretches Prostretch right x 3 20sec in standing x 1 with Lt to compare  pt with ease on left   Knee/Hip Exercises: Standing   Heel Raises Both;3 sets;10 reps  with B UE support at stairs   Hip Flexion Stengthening;2 sets;10 reps;Both;Knee bent  2# added   Hip Abduction Both;2 sets;10 reps  2# added   Hip Extension Stengthening;Both;2 sets;10 reps  2# added   Other Standing Knee Exercises mini squats x 10  pt with good performance                PT Education - 03/22/15 1243    Education provided Yes   Education Details Standing hip ABD, Flex and extension   Person(s) Educated Patient   Methods Explanation;Demonstration;Handout   Comprehension Verbalized understanding;Returned demonstration          PT Short Term Goals - 03/22/15  1049    PT SHORT TERM GOAL #1   Title be independent in initial HEP   Time 4   Period Weeks   Status On-going   PT SHORT TERM GOAL #2   Title perform TUG in < or = to 20 seconds to improve balance   Time 4   Period Weeks   Status On-going   PT SHORT TERM GOAL #3   Title report a 25% reduction in Rt LE pain with standing tasks   Time 4   Period Weeks   Status On-going   PT SHORT TERM GOAL #4   Title stand and walk for 20 minutes without limitation due to pain   Time 4   Period Weeks   Status On-going           PT Long Term Goals - 03/22/15 1049    PT LONG TERM GOAL #1   Title be independent in advanced HEP   Time 8   Period Weeks   Status On-going   PT LONG TERM GOAL #2   Title reduce FOTO to < or = to 35% limitation   Time 8   Period Weeks   Status On-going   PT LONG TERM GOAL #3   Title perform TUG in < or = to 18 seconds to reduce risk of falls   Time 8   Period Weeks   Status On-going   PT LONG TERM GOAL #5   Title report a 50% reduction in Rt LE pain with standing and walking   Time 8   Period Weeks   Status On-going               Plan - 03/22/15 1035    Clinical Impression Statement Pt with continued gait abnormality -increased external rotation- and instability. Pt with tight hamstrings and gastrocnemius. Pt will continue to benefit from skilled PT to improve from PT to improve flexibility, strength and balance for safe ambulation.    Pt will benefit from skilled therapeutic intervention in order to improve on the following deficits Abnormal gait;Difficulty walking;Pain;Postural dysfunction;Decreased balance;Decreased activity tolerance   Rehab Potential Good   PT Frequency 2x / week   PT Duration 8 weeks   PT Treatment/Interventions ADLs/Self Care Home Management;Cryotherapy;Electrical Stimulation;Moist Heat;Ultrasound;Gait training;Functional mobility training;Therapeutic activities;Therapeutic exercise;Balance training;Neuromuscular  re-education;Patient/family education;Manual techniques;Passive range of motion   PT Next Visit Plan manual, balance, flexibility.  Add standing hip exercises to HEP   Consulted and Agree with Plan of Care Patient        Problem List  Patient Active Problem List   Diagnosis Date Noted  . Leg edema, right 02/20/2015  . BPH (benign prostatic hypertrophy) 12/09/2014  . Chronic radicular low back pain 08/06/2012  . COPD (chronic obstructive pulmonary disease) ([place]) 04/21/2012  . Hyponatremia 03/29/2012  . Localized osteoarthrosis not specified whether primary or secondary, lower leg 06/20/2010  . VARICOSE VEINS LOWER EXTREMITIES W/INFLAMMATION 02/12/2010  . [place] LOCALIZED OSTEOARTHROSIS INVOLVING HAND 10/16/2009  . DUPUYTREN'S CONTRACTURE, RIGHT 07/04/2009  . DEGENERATIVE DISC DISEASE, LUMBOSACRAL SPINE W/RADICULOPATHY 06/19/2009  . CARCINOMA, BASAL CELL 02/07/2009  . ANEMIA OF CHRONIC DISEASE 02/07/2009  . BURSITIS, RIGHT SHOULDER 10/07/2008  . CATARACT ASSOCIATED WITH OTHER SYNDROMES 08/01/2008  . DRY SKIN 08/01/2008  . CERUMEN IMPACTION, BILATERAL 03/01/2008  . CRAMP IN LIMB 01/18/2008  . DEPRESSION, SITUATIONAL, ACUTE 12/28/2007  . CONSTIPATION, DRUG INDUCED 12/28/2007  . POLYNEUROPATHY OTHER DISEASES CLASSIFIED ELSW 09/24/2007  . WEIGHT LOSS, ABNORMAL 01/07/2007  . Hyperlipidemia 09/22/2006  . Essential hypertension 09/22/2006  . GERD 09/22/2006  . OSTEOARTHRITIS 09/22/2006    NAUMANN-HOUEGNIFIO,Shellie Goettl PTA 03/22/2015, 12:44 PM  [facility] [address], [place], [place], [address] Phone: [phone]   Fax:  [phone]  Name: Darin [name] MRN: [id number] Date of Birth: 1931-12-24

## 2015-03-22 NOTE — Patient Instructions (Addendum)
    Knee High   Perform all exercises x 10 you can alternate the legs.  Repeat 2 - 3 times, Perform 2-3 x per day Make sure to hold on something stable for safety  Holding stable object, raise knee to hip level, then lower knee. Repeat with other knee. Complete __10_ repetitions. Do __2__ sessions per day.  ABDUCTION: Standing (Active)   Stand, feet flat. Lift right leg out to side. Use _0__ lbs. Complete __10_ repetitions. Perform __2_ sessions per day.   EXTENSION: Standing (Active)  Stand, both feet flat. Draw right leg behind body as far as possible.  Complete 10 repetitions, repeat 2-3x.  Perform __2_ sessions per day.  Copyright  VHI. All rights reserved.

## 2015-03-24 ENCOUNTER — Ambulatory Visit: Payer: Medicare Other | Admitting: Physical Therapy

## 2015-03-24 ENCOUNTER — Encounter: Payer: Self-pay | Admitting: Physical Therapy

## 2015-03-24 DIAGNOSIS — R269 Unspecified abnormalities of gait and mobility: Secondary | ICD-10-CM | POA: Diagnosis not present

## 2015-03-24 DIAGNOSIS — R262 Difficulty in walking, not elsewhere classified: Secondary | ICD-10-CM | POA: Diagnosis not present

## 2015-03-24 DIAGNOSIS — M25561 Pain in right knee: Secondary | ICD-10-CM

## 2015-03-24 NOTE — Therapy (Signed)
[facility] [address] [place], [place], [address] Phone: [phone]   Fax:  [phone]  Physical Therapy Treatment  Patient Details  Name: Darin [name] MRN: [id number] Date of Birth: October 28, 1931 Referring Provider: [name], MD  Encounter Date: 03/24/2015      PT End of Session - 03/24/15 0806    Visit Number 6   Number of Visits 10   Date for PT Re-Evaluation 05/02/15   PT Start Time [id number]   PT Stop Time 0843   PT Time Calculation (min) 46 min   Activity Tolerance Patient tolerated treatment well   Behavior During Therapy [facility] for tasks assessed/performed      Past Medical History  Diagnosis Date  . GERD (gastroesophageal reflux disease)   . Hyperlipidemia   . Hypertension   . Arthritis   . PMR (polymyalgia rheumatica) (HCC)   . Knee pain   . Osteoporosis   . H/O hiatal hernia     "FOR YEARS" " NO PROBLEM"  . Neuropathy (HCC)     PERIPHERAL   . Polymyalgia ([place]) 5 YEARS    Past Surgical History  Procedure Laterality Date  . Tonsillectomy    . Carpal tunnel release    . Varicose vein surgery    . Cataract extraction      right  . Inguinal hernia repair  10/14/2011    Procedure: HERNIA REPAIR INGUINAL ADULT;  Surgeon: [name], MD;  Location: [place];  Service: General;  Laterality: Right;  Right Inguinal Hernia Repair with Mesh    There were no vitals filed for this visit.  Visit Diagnosis:  Arthralgia of right lower leg  Abnormality of gait  Difficulty walking      Subjective Assessment - 03/24/15 0800    Subjective Pt arrived with his walking stick, he reports no major changes noted since last visit. Cramping in the calf happens usually during the night   Pertinent History herniated disc in the lumbar spine per pt report-L4/5; Rt calf muscle cramping -mainly during the night-   Limitations Walking;Standing   How long can you stand comfortably? 10-15 minutes   How  long can you walk comfortably? 20 minutes   Diagnostic tests Doppler for DVT: negative, x-ray of Rt tib/fib: negative   Patient Stated Goals reduce leg pain, improve balance   Currently in Pain? No/denies                         [facility] Adult PT Treatment/Exercise - 03/24/15 0001    High Level Balance   High Level Balance Comments standing tap ups with 1 UE support 2 x 10  on 6 inch stair   Lumbar Exercises: Stretches   Active Hamstring Stretch 2 reps;30 seconds  on stairs   Single Knee to Chest Stretch 3 reps;20 seconds   Lower Trunk Rotation 3 reps;20 seconds  need reminders for technique   Piriformis Stretch 2 reps;30 seconds  in sitting   Lumbar Exercises: Aerobic   Stationary Bike nustep level 2 x 10 mins  seat #12, arms #10,  0.13mph in 10 min   Knee/Hip Exercises: Stretches   Other Knee/Hip Stretches Prostretch right x 2 30sec in standing with B UE support   Other Knee/Hip Stretches Gastrocnemius stretch in longsitting, soleeus, gastrocneius stretch; and DF in sitting   Knee/Hip Exercises: Standing   Heel Raises Both;3 sets;10 reps  with B UE support on stairs   Hip  Flexion Stengthening;2 sets;10 reps;Both;Knee bent  2# added   Hip Abduction Both;2 sets;10 reps  2# added   Hip Extension Stengthening;Both;2 sets;10 reps  2# added   Other Standing Knee Exercises mini squats x 10  pt with good performance                PT Education - 03/24/15 0833    Education provided Yes   Education Details Gastrocnemius, soleus, plantaris stretch in longsitting, standing with extented and bend knee and in sitting for DF    Person(s) Educated Patient   Methods Explanation;Demonstration;Handout   Comprehension Returned demonstration;Verbalized understanding          PT Short Term Goals - 03/22/15 1049    PT SHORT TERM GOAL #1   Title be independent in initial HEP   Time 4   Period Weeks   Status On-going   PT SHORT TERM GOAL #2   Title perform TUG  in < or = to 20 seconds to improve balance   Time 4   Period Weeks   Status On-going   PT SHORT TERM GOAL #3   Title report a 25% reduction in Rt LE pain with standing tasks   Time 4   Period Weeks   Status On-going   PT SHORT TERM GOAL #4   Title stand and walk for 20 minutes without limitation due to pain   Time 4   Period Weeks   Status On-going           PT Long Term Goals - 03/22/15 1049    PT LONG TERM GOAL #1   Title be independent in advanced HEP   Time 8   Period Weeks   Status On-going   PT LONG TERM GOAL #2   Title reduce FOTO to < or = to 35% limitation   Time 8   Period Weeks   Status On-going   PT LONG TERM GOAL #3   Title perform TUG in < or = to 18 seconds to reduce risk of falls   Time 8   Period Weeks   Status On-going   PT LONG TERM GOAL #5   Title report a 50% reduction in Rt LE pain with standing and walking   Time 8   Period Weeks   Status On-going               Plan - 03/24/15 0806    Clinical Impression Statement Pt continues with gait abnormality -increased external rotation- and instability. Pt will continue to benefit from skilled PT to adddress tight hamstrings and improve strength, flexibility and balance with gait   Pt will benefit from skilled therapeutic intervention in order to improve on the following deficits Abnormal gait;Difficulty walking;Pain;Postural dysfunction;Decreased balance;Decreased activity tolerance   Rehab Potential Good   PT Frequency 2x / week   PT Duration 8 weeks   PT Treatment/Interventions ADLs/Self Care Home Management;Cryotherapy;Electrical Stimulation;Moist Heat;Ultrasound;Gait training;Functional mobility training;Therapeutic activities;Therapeutic exercise;Balance training;Neuromuscular re-education;Patient/family education;Manual techniques;Passive range of motion   PT Next Visit Plan manual, balance, flexibility and strength     Consulted and Agree with Plan of Care Patient        Problem  List Patient Active Problem List   Diagnosis Date Noted  . Leg edema, right 02/20/2015  . BPH (benign prostatic hypertrophy) 12/09/2014  . Chronic radicular low back pain 08/06/2012  . COPD (chronic obstructive pulmonary disease) ([place]) 04/21/2012  . Hyponatremia 03/29/2012  . Localized osteoarthrosis not specified whether primary or  secondary, lower leg 06/20/2010  . VARICOSE VEINS LOWER EXTREMITIES W/INFLAMMATION 02/12/2010  . [place] LOCALIZED OSTEOARTHROSIS INVOLVING HAND 10/16/2009  . DUPUYTREN'S CONTRACTURE, RIGHT 07/04/2009  . DEGENERATIVE DISC DISEASE, LUMBOSACRAL SPINE W/RADICULOPATHY 06/19/2009  . CARCINOMA, BASAL CELL 02/07/2009  . ANEMIA OF CHRONIC DISEASE 02/07/2009  . BURSITIS, RIGHT SHOULDER 10/07/2008  . CATARACT ASSOCIATED WITH OTHER SYNDROMES 08/01/2008  . DRY SKIN 08/01/2008  . CERUMEN IMPACTION, BILATERAL 03/01/2008  . CRAMP IN LIMB 01/18/2008  . DEPRESSION, SITUATIONAL, ACUTE 12/28/2007  . CONSTIPATION, DRUG INDUCED 12/28/2007  . POLYNEUROPATHY OTHER DISEASES CLASSIFIED ELSW 09/24/2007  . WEIGHT LOSS, ABNORMAL 01/07/2007  . Hyperlipidemia 09/22/2006  . Essential hypertension 09/22/2006  . GERD 09/22/2006  . OSTEOARTHRITIS 09/22/2006    NAUMANN-HOUEGNIFIO,Helayne Metsker PTA 03/24/2015, 8:45 AM  [facility] [address], [place], [place], [address] Phone: [phone]   Fax:  [phone]  Name: Darin [name] MRN: [id number] Date of Birth: [birth year]

## 2015-03-24 NOTE — Patient Instructions (Signed)
SEATED Gastroc / Heel Cord Stretch - Seated With Towel   Sit on floor, towel around ball of foot. Gently pull foot in toward body, stretching heel cord and calf. Hold for _30__ seconds. Repeat on involved leg. Repeat __3_ times. Do _3__ times per day.   Achilles / Gastroc, Standing   Stand, right foot behind, heel on floor and turned slightly out, leg straight, forward leg bent. Move hips forward. Hold _30__ seconds. Repeat _3__ times per session. Do _3__ sessions per day.   Stretching: Soleus   Stand with right foot back, both knees bent. Keeping heel on floor, turned slightly out, lean into wall until stretch is felt in lower calf. Hold _30___ seconds. Repeat __3__ times per set. Do __3__ sessions per day.  [email or web address].us/665     Stand, one foot on wedge (slanted at about 30), heel resting on floor. Keep toes straight and hands on wall. With leg straight, press entire body forward. Hold _30__ seconds. Repeat _3__ times per session. Do __3_ sessions per day.  Dorsiflexion: Self-Mobilization (Sitting)   Feet flat, other foot forward, slide left foot back until gentle stretch is felt. Keep entire foot on floor. Hold _30___ seconds. Relax. Repeat __3__ times per set. Do __3__ sessions per day.  [email or web address].[email or web address]

## 2015-03-28 ENCOUNTER — Ambulatory Visit: Payer: Medicare Other | Attending: Family Medicine | Admitting: Physical Therapy

## 2015-03-28 ENCOUNTER — Encounter: Payer: Self-pay | Admitting: Physical Therapy

## 2015-03-28 DIAGNOSIS — M25561 Pain in right knee: Secondary | ICD-10-CM | POA: Insufficient documentation

## 2015-03-28 DIAGNOSIS — R262 Difficulty in walking, not elsewhere classified: Secondary | ICD-10-CM | POA: Diagnosis not present

## 2015-03-28 DIAGNOSIS — R269 Unspecified abnormalities of gait and mobility: Secondary | ICD-10-CM | POA: Insufficient documentation

## 2015-03-28 NOTE — Therapy (Signed)
[facility] [address] [place], [place], [address] Phone: [phone]   Fax:  [phone]  Physical Therapy Treatment  Patient Details  Name: Darin [name] MRN: [id number] Date of Birth: 1931-09-11 Referring Provider: Eduard Clos MD  Encounter Date: 03/28/2015      PT End of Session - 03/28/15 0959    Visit Number 7  pt is traveling out of country   Number of Visits 10   Date for PT Re-Evaluation 05/02/15   PT Start Time 0936   PT Stop Time 1016   PT Time Calculation (min) 40 min   Activity Tolerance Patient tolerated treatment well   Behavior During Therapy [facility] for tasks assessed/performed      Past Medical History  Diagnosis Date  . GERD (gastroesophageal reflux disease)   . Hyperlipidemia   . Hypertension   . Arthritis   . PMR (polymyalgia rheumatica) (HCC)   . Knee pain   . Osteoporosis   . H/O hiatal hernia     "FOR YEARS" " NO PROBLEM"  . Neuropathy (HCC)     PERIPHERAL   . Polymyalgia ([place]) 5 YEARS    Past Surgical History  Procedure Laterality Date  . Tonsillectomy    . Carpal tunnel release    . Varicose vein surgery    . Cataract extraction      right  . Inguinal hernia repair  10/14/2011    Procedure: HERNIA REPAIR INGUINAL ADULT;  Surgeon: [name], MD;  Location: [place];  Service: General;  Laterality: Right;  Right Inguinal Hernia Repair with Mesh    There were no vitals filed for this visit.  Visit Diagnosis:  Arthralgia of right lower leg  Abnormality of gait  Difficulty walking      Subjective Assessment - 03/28/15 1155    Subjective Pt reports 8% improvement since start of care, he notices more confidence with ambulation. Pt is traveling to [facility] this Friday January 6th. Cramping in the cald during the night is less.   Pertinent History herniated disc in the lumbar spine per pt report-L4/5; Rt calf muscle cramping -mainly during the night-   Limitations  Walking;Standing   How long can you stand comfortably? 10-15 minutes   How long can you walk comfortably? 30   Diagnostic tests Doppler for DVT: negative, x-ray of Rt tib/fib: negative   Patient Stated Goals reduce leg pain, improve balance   Currently in Pain? No/denies            [facility] PT Assessment - 03/28/15 0001    Assessment   Medical Diagnosis Rt lumbar radiculitis   Referring Provider Eduard Clos MD   Onset Date/Surgical Date 01/19/15   Next MD Visit 03/10/15   Precautions   Precautions Fall   Restrictions   Weight Bearing Restrictions No   Balance Screen   Has the patient fallen in the past 6 months Yes   How many times? 1  passed out   Has the patient had a decrease in activity level because of a fear of falling?  No   Is the patient reluctant to leave their home because of a fear of falling?  No   Home Environment   Living Environment Private residence   [place] Access Level entry   [place] One level   Prior Function   Level of Independence Independent   Vocation Retired   Leisure pt travels   Associate Professor   Overall  Cognitive Status Within Functional Limits for tasks assessed   Observation/Other Assessments   Focus on Therapeutic Outcomes (FOTO)  51% limitation   ROM / Strength   AROM / PROM / Strength AROM   PROM   Overall PROM  Deficits   Overall PROM Comments hip IR/ER limited by 50% bilaterally, SLR limited by 25% with Rt calf pain reproduced wiht testing   Strength   Overall Strength Deficits   Overall Strength Comments 4+/5 bilateral LE strength   Palpation   Palpation comment Pt with palpable tenderness over bil lumbar paraspinals (Rt>Lt) and Rt gluteals   Standardized Balance Assessment   Standardized Balance Assessment Timed Up and Go Test   Timed Up and Go Test   TUG Normal TUG   Normal TUG (seconds) --  16                     [facility] Adult PT Treatment/Exercise - 03/28/15 0001    Transfers    Transfers Sit to Stand;Stand to Sit  needs Ue support   Sit to Stand 4: Min guard   Stand to Sit 5: Supervision   Comments mod UE support with sit to stand and needed to stabilize after standing   Ambulation/Gait   Ambulation/Gait Yes   Ambulation/Gait Assistance 6: Modified independent (Device/Increase time)   Ambulation Distance (Feet) 80 Feet   Gait Pattern Step-through pattern;Decreased stride length;Wide base of support   Gait Comments using walking stick   Posture/Postural Control   Posture/Postural Control Postural limitations   Postural Limitations Rounded Shoulders;Forward head;Decreased lumbar lordosis;Posterior pelvic tilt   Lumbar Exercises: Aerobic   Stationary Bike nustep level 2 x 98mns   Knee/Hip Exercises: Stretches   Other Knee/Hip Stretches Prostretch right x 2 30sec in standing with B UE support   Other Knee/Hip Stretches Gastrocnemius stretch in longsitting, soleus, gastrocneius stretch; and DF in sitting  Hamstring/gastroc in standing   Knee/Hip Exercises: Standing   Heel Raises Both;3 sets;10 reps  with B UE   Hip Flexion Stengthening;2 sets;10 reps;Both;Knee bent  2# added   Hip Abduction Both;2 sets;10 reps  2# added   Hip Extension Stengthening;Both;2 sets;10 reps  2# added   Other Standing Knee Exercises Educated pt on SLR in supine and SL                PT Education - 03/28/15 0946    Education provided Yes   Education Details hip in standing   Person(s) Educated Patient   Methods Explanation;Demonstration;Handout   Comprehension Returned demonstration          PT Short Term Goals - 03/28/15 1022    PT SHORT TERM GOAL #1   Title be independent in initial HEP   Time 4   Period Weeks   Status Achieved   PT SHORT TERM GOAL #2   Title perform TUG in < or = to 20 seconds to improve balance   Time 4   Period Weeks   Status Achieved   PT SHORT TERM GOAL #3   Title report a 25% reduction in Rt LE pain with standing tasks  35   Time  4   Period Weeks   Status Achieved   PT SHORT TERM GOAL #4   Title stand and walk for 20 minutes without limitation due to pain   Time 4   Period Weeks   Status Achieved           PT Long Term Goals - 03/28/15  1024    PT LONG TERM GOAL #1   Title be independent in advanced HEP   Time 8   Period Weeks   Status Achieved   PT LONG TERM GOAL #2   Title reduce FOTO to < or = to 35% limitation   Time 8   Period Weeks   Status Not Met   PT LONG TERM GOAL #3   Title perform TUG in < or = to 18 seconds to reduce risk of falls  15sec   Time 8   Period Weeks   Status Achieved   PT LONG TERM GOAL #4   Title perform sit to stand and demonstrate stability upon standing   Time 8   Period Weeks   Status Achieved   PT LONG TERM GOAL #5   Title report a 50% reduction in Rt LE pain with standing and walking  35%   Time 8   Period Weeks   Status Partially Met               Plan - 17-Apr-2015 1001    Clinical Impression Statement Pt with slighly improvement with his gait. Pt reports 8% improvement since start of care. Pt is traveling to [facility] on Friday the 6th of January and is D/C to HEP   Pt will benefit from skilled therapeutic intervention in order to improve on the following deficits Abnormal gait;Difficulty walking;Pain;Postural dysfunction;Decreased balance;Decreased activity tolerance   Rehab Potential Good   PT Frequency 2x / week   PT Duration 8 weeks   PT Treatment/Interventions ADLs/Self Care Home Management;Cryotherapy;Electrical Stimulation;Moist Heat;Ultrasound;Gait training;Functional mobility training;Therapeutic activities;Therapeutic exercise;Balance training;Neuromuscular re-education;Patient/family education;Manual techniques;Passive range of motion   PT Next Visit Plan manual, balance, flexibility and strength     Consulted and Agree with Plan of Care Patient          G-Codes - 17-Apr-2015 1224    Functional Assessment Tool Used FOTO: 51% limitation    Functional Limitation Other PT primary   Other PT Primary Goal Status (A9191) At least 20 percent but less than 40 percent impaired, limited or restricted   Other PT Primary Discharge Status (Y6060) At least 40 percent but less than 60 percent impaired, limited or restricted      Problem List Patient Active Problem List   Diagnosis Date Noted  . Leg edema, right 02/20/2015  . BPH (benign prostatic hypertrophy) 12/09/2014  . Chronic radicular low back pain 08/06/2012  . COPD (chronic obstructive pulmonary disease) ([place]) 04/21/2012  . Hyponatremia 03/29/2012  . Localized osteoarthrosis not specified whether primary or secondary, lower leg 06/20/2010  . VARICOSE VEINS LOWER EXTREMITIES W/INFLAMMATION 02/12/2010  . [place] LOCALIZED OSTEOARTHROSIS INVOLVING HAND 10/16/2009  . DUPUYTREN'S CONTRACTURE, RIGHT 07/04/2009  . DEGENERATIVE DISC DISEASE, LUMBOSACRAL SPINE W/RADICULOPATHY 06/19/2009  . CARCINOMA, BASAL CELL 02/07/2009  . ANEMIA OF CHRONIC DISEASE 02/07/2009  . BURSITIS, RIGHT SHOULDER 10/07/2008  . CATARACT ASSOCIATED WITH OTHER SYNDROMES 08/01/2008  . DRY SKIN 08/01/2008  . CERUMEN IMPACTION, BILATERAL 03/01/2008  . CRAMP IN LIMB 01/18/2008  . DEPRESSION, SITUATIONAL, ACUTE 12/28/2007  . CONSTIPATION, DRUG INDUCED 12/28/2007  . POLYNEUROPATHY OTHER DISEASES CLASSIFIED ELSW 09/24/2007  . WEIGHT LOSS, ABNORMAL 01/07/2007  . Hyperlipidemia 09/22/2006  . Essential hypertension 09/22/2006  . GERD 09/22/2006  . OSTEOARTHRITIS 09/22/2006    Shine Mikes Naumann-Houegnifio, PTA 04/17/15 12:24 PM PHYSICAL THERAPY DISCHARGE SUMMARY  Visits from Start of Care: 7  Current functional level related to goals / functional outcomes: Pt attended 7 PT sessions to address  balance and back pain.  Pt will be leaving for a trip and will be discharged at this time.     Remaining deficits: Continued balance deficits and LBP of a chronic nature.  Pt has HEP in place and will work on this while he is  traveling.     Education / Equipment: Fall prevention, HEP Plan: Patient agrees to discharge.  Patient goals were partially met. Patient is being discharged due to the patient's request.  ?????   Sigurd Sos, PT 03/28/2015 12:27 PM   [facility] [address], [place], [place], [address] Phone: [phone]   Fax:  [phone]  Name: Darin [name] MRN: [id number] Date of Birth: 1932-01-09

## 2015-03-28 NOTE — Patient Instructions (Addendum)
Hip Extension (Prone)   Lift left leg ____ inches from floor, keeping knee locked. Repeat ____ times per set. Do ____ sets per session. Do ____ sessions per day.  http://orth.exer.us/98   Copyright  VHI. All rights reserved.  Hip Adduction: Leg Lift (Eccentric) - Side-Lying   Lie on side with top leg bent, foot flat behind lower leg. Quickly lift lower leg. Slowly lower for 3-5 seconds. ___ reps per set, ___ sets per day, ___ days per week. Add ___ lbs when you achieve ___ repetitions.  Copyright  VHI. All rights reserved.  Abduction: Side Leg Lift (Eccentric) - Side-Lying   Lie on side. Lift top leg slightly higher than shoulder level. Keep top leg straight with body, toes pointing forward. Slowly lower for 3-5 seconds. ___ reps per set, ___ sets per day, ___ days per week. Add ___ lbs when you achieve ___ repetitions.  Copyright  VHI. All rights reserved.  Strengthening: Straight Leg Raise (Phase 1)   Tighten muscles on front of right thigh, then lift leg ____ inches from surface, keeping knee locked.  Repeat ____ times per set. Do ____ sets per session. Do ____ sessions per day.  http://orth.exer.us/614   Copyright  VHI. All rights reserved.  Copyright  VHI. All rights reserved. ABDUCTION: Standing - Resistance Band (Active)   Stand, feet flat. Against yellow resistance band, lift right leg out to side. Complete ___ sets of ___ repetitions. Perform ___ sessions per day.  Strengthening: Hip Flexion - Resisted    Bend your knee and bring your leg up toward your hip not shown in picture Repeat _10___ times per set. Do 2-3____ sets per session. Do 3____ sessions per day.  Strengthening: Hip Extension - Resisted   With tubing around right ankle, face anchor and pull leg straight back. Repeat __10__ times per set. Do _2-3___ sets per session. Do __3__ sessions per day.

## 2015-05-18 ENCOUNTER — Encounter: Payer: Medicare Other | Admitting: Family Medicine

## 2015-05-22 ENCOUNTER — Other Ambulatory Visit: Payer: Self-pay

## 2015-05-22 ENCOUNTER — Other Ambulatory Visit: Payer: Self-pay | Admitting: Family Medicine

## 2015-05-22 MED ORDER — BENAZEPRIL-HYDROCHLOROTHIAZIDE 10-12.5 MG PO TABS: 1.0000 | ORAL_TABLET | Freq: Every day | ORAL | Status: DC

## 2015-05-22 NOTE — Telephone Encounter (Signed)
Pt request refill of the following: benazepril-hydrochlorthiazide (LOTENSIN HCT) 10-12.5 MG per tablet ° ° °Phamacy: [facility]   ° °

## 2015-05-22 NOTE — Telephone Encounter (Signed)
Rx sent 

## 2015-06-15 ENCOUNTER — Encounter: Payer: Self-pay | Admitting: Family Medicine

## 2015-06-15 ENCOUNTER — Ambulatory Visit (INDEPENDENT_AMBULATORY_CARE_PROVIDER_SITE_OTHER): Payer: Medicare Other | Admitting: Family Medicine

## 2015-06-15 VITALS — BP 124/70 | HR 70 | Temp 97.4°F | Ht 73.0 in | Wt 215.1 lb

## 2015-06-15 DIAGNOSIS — Z7189 Other specified counseling: Secondary | ICD-10-CM

## 2015-06-15 DIAGNOSIS — K219 Gastro-esophageal reflux disease without esophagitis: Secondary | ICD-10-CM | POA: Diagnosis not present

## 2015-06-15 DIAGNOSIS — Z23 Encounter for immunization: Secondary | ICD-10-CM | POA: Diagnosis not present

## 2015-06-15 DIAGNOSIS — N400 Enlarged prostate without lower urinary tract symptoms: Secondary | ICD-10-CM

## 2015-06-15 DIAGNOSIS — Z0000 Encounter for general adult medical examination without abnormal findings: Secondary | ICD-10-CM

## 2015-06-15 DIAGNOSIS — I10 Essential (primary) hypertension: Secondary | ICD-10-CM

## 2015-06-15 DIAGNOSIS — Z7184 Encounter for health counseling related to travel: Secondary | ICD-10-CM

## 2015-06-15 MED ORDER — ATOVAQUONE-PROGUANIL HCL 250-100 MG PO TABS: ORAL_TABLET | ORAL | Status: DC

## 2015-06-15 NOTE — Progress Notes (Signed)
Pre visit review using our clinic review tool, if applicable. No additional management support is needed unless otherwise documented below in the visit note. 

## 2015-06-15 NOTE — Progress Notes (Signed)
Subjective:    Patient ID: Darin [name], male    DOB: 1931-04-09, 82 y.o.   MRN: [id number]  HPI  Patient here for Medicare subsequent annual wellness visit and medical follow-up Chronic problems include hypertension, osteoarthritis, chronic bilateral leg edema, hyperlipidemia but with excellent HDL, and history of BPH.  Upcoming travel to [place]. Needs malaria prevention.  Has taken Malarone in the past. Typhoid vaccination up-to-date.   Has had prior hepatitis A vaccination Needs tetanus booster. Other immunizations up-to-date.  Hypertension stable. No recent dizziness. No headaches. No chest pains. No recent falls. Compliant with medications. History of GERD which has been controlled with over-the-counter Pepcid. He remains on Proscar for BPH. No obstructive urinary symptoms.  Past Medical History  Diagnosis Date  . GERD (gastroesophageal reflux disease)   . Hyperlipidemia   . Hypertension   . Arthritis   . PMR (polymyalgia rheumatica) (HCC)   . Knee pain   . Osteoporosis   . H/O hiatal hernia     "FOR YEARS" " NO PROBLEM"  . Neuropathy (HCC)     PERIPHERAL   . Polymyalgia ([place]) 5 YEARS   Past Surgical History  Procedure Laterality Date  . Tonsillectomy    . Carpal tunnel release    . Varicose vein surgery    . Cataract extraction      right  . Inguinal hernia repair  10/14/2011    Procedure: HERNIA REPAIR INGUINAL ADULT;  Surgeon: [name], MD;  Location: [place];  Service: General;  Laterality: Right;  Right Inguinal Hernia Repair with Mesh    reports that he has never smoked. He has never used smokeless tobacco. He reports that he drinks alcohol. He reports that he does not use illicit drugs. family history includes Heart disease in his father. Allergies  Allergen Reactions  . Statins Other (See Comments)    Muscle pain and upset stomach   1.  Risk factors based on Past Medical , Social, and Family history reviewed and as indicated above with no  changes 2.  Limitations in physical activities None.  No recent falls. 3.  Depression/mood No active depression or anxiety issues 4.  Hearing No defiits 5.  ADLs independent in all. 6.  Cognitive function (orientation to time and place, language, writing, speech,memory) no short or long term memory issues.  Language and judgement intact. 7.  Home Safety no issues 8.  Height, weight, and visual acuity.all stable. 9.  Counseling discussed  Fall prevention 10. Recommendation of preventive services. Tetanus booster. Continue yearly flu vaccine. 11. Labs based on risk factors none 12. Care Plan as above 13. Other Providers Dr Wallis Mart. 14. Written schedule of screening/prevention services given to patient.   Review of Systems  Constitutional: Negative for fatigue.  Eyes: Negative for visual disturbance.  Respiratory: Negative for cough, chest tightness and shortness of breath.   Cardiovascular: Negative for chest pain, palpitations and leg swelling.  Gastrointestinal: Negative for nausea, vomiting and abdominal pain.  Endocrine: Negative for polydipsia and polyuria.  Genitourinary: Negative for dysuria.  Musculoskeletal: Positive for arthralgias.  Neurological: Negative for dizziness, syncope, weakness, light-headedness and headaches.  Psychiatric/Behavioral: Negative for confusion.       Objective:   Physical Exam  Constitutional: He is oriented to person, place, and time. He appears well-developed and well-nourished.  HENT:  Right Ear: External ear normal.  Left Ear: External ear normal.  Mouth/Throat: Oropharynx is clear and moist.  Eyes: Pupils are equal, round, and reactive to light.  Neck: Neck supple. No thyromegaly present.  Cardiovascular: Normal rate and regular rhythm.   Pulmonary/Chest: Effort normal and breath sounds normal. No respiratory distress. He has no wheezes. He has no rales.  Abdominal: Soft. Bowel sounds are normal. He exhibits no distension and no  mass. There is no tenderness. There is no rebound and no guarding.  Musculoskeletal: He exhibits no edema.  Neurological: He is alert and oriented to person, place, and time.  Skin:  Multiple scattered nevi. None with significant atypical changes.  Psychiatric: He has a normal mood and affect. His behavior is normal.          Assessment & Plan:  #1 Medicare subsequent annual wellness visit. Patient needs tetanus booster. He declines shingles vaccine after discussing pros and cons  #2 hypertension stable and well controlled  #3 GERD stable with over-the-counter Pepcid  #4 BPH. Stable on finasteride  #5 travel advice encounter. Prescription for Malarone 250 mg take one tablet daily starting 1 day prior travel, during travel, and for 1 week after return. Typhoid is up-to-date. He's had previous hepatitis A vaccination.

## 2015-06-15 NOTE — Patient Instructions (Signed)
Health Maintenance  Topic Date Due  . ZOSTAVAX  09/26/1993  . TETANUS/TDAP  03/25/2013  . INFLUENZA VACCINE  10/24/2015  . PNA vac Low Risk Adult  Completed

## 2015-06-19 ENCOUNTER — Telehealth: Payer: Self-pay | Admitting: Family Medicine

## 2015-06-19 MED ORDER — MELOXICAM 15 MG PO TABS: 15.0000 mg | ORAL_TABLET | Freq: Every day | ORAL | Status: DC

## 2015-06-19 NOTE — Telephone Encounter (Signed)
Pt need refill on meloxicam 15 mg #90 w/refills send to [facility] order

## 2015-06-19 NOTE — Telephone Encounter (Signed)
Medication was sent in for patient.

## 2015-06-20 ENCOUNTER — Ambulatory Visit (INDEPENDENT_AMBULATORY_CARE_PROVIDER_SITE_OTHER): Payer: Medicare Other | Admitting: Podiatry

## 2015-06-20 ENCOUNTER — Encounter: Payer: Self-pay | Admitting: Podiatry

## 2015-06-20 DIAGNOSIS — B351 Tinea unguium: Secondary | ICD-10-CM | POA: Diagnosis not present

## 2015-06-20 DIAGNOSIS — M79676 Pain in unspecified toe(s): Secondary | ICD-10-CM

## 2015-06-20 NOTE — Progress Notes (Signed)
Patient ID: Darin [name], male   DOB: 02/28/1932, 83 y.o.   MRN: [id number]  Subjective: This patient presents for scheduled visit complaining of painful toenails when walking wearing shoes and requests nail debridement  Objective: Orientated 3 No open skin lesions noted bilaterally The toenails are incurvated, hypertrophic, discolored and tender to direct palpation 6-10  Assessment: Symptomatic onychomycoses 6-10  Plan: Debridement toenails 10 and mechanically and electrically without any bleeding  Reappoint 3 months 

## 2015-08-28 ENCOUNTER — Telehealth: Payer: Self-pay | Admitting: Family Medicine

## 2015-08-28 MED ORDER — FAMOTIDINE 20 MG PO TABS: 20.0000 mg | ORAL_TABLET | Freq: Every day | ORAL | Status: DC

## 2015-08-28 MED ORDER — BENAZEPRIL-HYDROCHLOROTHIAZIDE 10-12.5 MG PO TABS: 1.0000 | ORAL_TABLET | Freq: Every day | ORAL | Status: DC

## 2015-08-28 MED ORDER — FINASTERIDE 5 MG PO TABS: 5.0000 mg | ORAL_TABLET | Freq: Every day | ORAL | Status: DC

## 2015-08-28 NOTE — Telephone Encounter (Signed)
Refills have been sent to pharmacy °

## 2015-08-28 NOTE — Telephone Encounter (Signed)
Pt request refill of the following:     finasteride (PROSCAR) 5 MG tablet   ,  benazepril-hydrochlorthiazide (LOTENSIN HCT) 10-12.5 MG tablet  ,  famotidine (PEPCID) 20 MG tablet  90 day refill    Phamacy: [facility]

## 2015-09-19 ENCOUNTER — Ambulatory Visit (INDEPENDENT_AMBULATORY_CARE_PROVIDER_SITE_OTHER): Payer: Medicare Other | Admitting: Podiatry

## 2015-09-19 ENCOUNTER — Encounter: Payer: Self-pay | Admitting: Podiatry

## 2015-09-19 DIAGNOSIS — B351 Tinea unguium: Secondary | ICD-10-CM | POA: Diagnosis not present

## 2015-09-19 DIAGNOSIS — M79676 Pain in unspecified toe(s): Secondary | ICD-10-CM

## 2015-09-19 NOTE — Progress Notes (Signed)
Patient ID: Darin [name], male   DOB: 1931/10/05, 82 y.o.   MRN: [id number]  Subjective: This patient presents for scheduled visit complaining of painful toenails when walking wearing shoes and requests nail debridement  Objective: Orientated 3 No open skin lesions noted bilaterally The toenails are incurvated, hypertrophic, discolored and tender to direct palpation 6-10  Assessment: Symptomatic onychomycoses 6-10  Plan: Debridement toenails 10 and mechanically and electrically without any bleeding  Reappoint 3 months

## 2015-10-09 ENCOUNTER — Telehealth: Payer: Self-pay | Admitting: Family Medicine

## 2015-10-09 MED ORDER — GABAPENTIN 100 MG PO CAPS: 100.0000 mg | ORAL_CAPSULE | Freq: Four times a day (QID) | ORAL | Status: DC

## 2015-10-09 NOTE — Telephone Encounter (Signed)
Pt needs a refill on gabapentin 100 mg four times a day #360 for 90 day supply w/refills send to [facility]

## 2015-10-09 NOTE — Telephone Encounter (Signed)
Medication sent to pharmacy  

## 2015-11-07 ENCOUNTER — Ambulatory Visit (INDEPENDENT_AMBULATORY_CARE_PROVIDER_SITE_OTHER): Payer: Medicare Other | Admitting: Family Medicine

## 2015-11-07 ENCOUNTER — Encounter: Payer: Self-pay | Admitting: Family Medicine

## 2015-11-07 VITALS — BP 130/60 | HR 78 | Temp 98.2°F | Ht 73.0 in | Wt 210.0 lb

## 2015-11-07 DIAGNOSIS — H6123 Impacted cerumen, bilateral: Secondary | ICD-10-CM | POA: Diagnosis not present

## 2015-11-07 NOTE — Progress Notes (Signed)
Subjective:     Patient ID: Darin [name], male   DOB: 07-Dec-1931, 82 y.o.   MRN: [id number]  HPI Patient seen with concern for bilateral cerumen impactions. He's noticed some fullness in both ears for the past couple weeks. No drainage. He has some chronic hearing deficits which are unchanged. No dizziness. No ear pain. No alleviating or exacerbating factors  Past Medical History:  Diagnosis Date  . Arthritis   . GERD (gastroesophageal reflux disease)   . H/O hiatal hernia    "FOR YEARS" " NO PROBLEM"  . Hyperlipidemia   . Hypertension   . Knee pain   . Neuropathy (HCC)    PERIPHERAL   . Osteoporosis   . PMR (polymyalgia rheumatica) (HCC)   . Polymyalgia ([place]) 5 YEARS   Past Surgical History:  Procedure Laterality Date  . CARPAL TUNNEL RELEASE    . CATARACT EXTRACTION     right  . INGUINAL HERNIA REPAIR  10/14/2011   Procedure: HERNIA REPAIR INGUINAL ADULT;  Surgeon: [name], MD;  Location: [place];  Service: General;  Laterality: Right;  Right Inguinal Hernia Repair with Mesh  . TONSILLECTOMY    . VARICOSE VEIN SURGERY      reports that he has never smoked. He has never used smokeless tobacco. He reports that he drinks alcohol. He reports that he does not use drugs. family history includes Heart disease in his father. Allergies  Allergen Reactions  . Statins Other (See Comments)    Muscle pain and upset stomach     Review of Systems  Constitutional: Negative for fever.  HENT: Positive for hearing loss. Negative for congestion, ear discharge and ear pain.   Neurological: Negative for dizziness.       Objective:   Physical Exam  Constitutional: He appears well-developed and well-nourished.  HENT:  Bilateral cerumen impactions  Cardiovascular: Normal rate.   Pulmonary/Chest: Effort normal and breath sounds normal. No respiratory distress. He has no wheezes. He has no rales.       Assessment:     Bilateral cerumen impactions    Plan:     -Recommend  irrigation which patient has had in the past -Irrigation with removal without difficulty  Darin Post MD [facility]

## 2015-11-07 NOTE — Progress Notes (Signed)
Pre visit review using our clinic review tool, if applicable. No additional management support is needed unless otherwise documented below in the visit note. 

## 2015-11-20 ENCOUNTER — Telehealth: Payer: Self-pay | Admitting: Family Medicine

## 2015-11-20 MED ORDER — FINASTERIDE 5 MG PO TABS: 5.0000 mg | ORAL_TABLET | Freq: Every day | ORAL | 1 refills | Status: DC

## 2015-11-20 NOTE — Telephone Encounter (Signed)
Pt request refill   finasteride (PROSCAR) 5 MG tablet  90 day w/ refills  [facility]

## 2015-11-20 NOTE — Telephone Encounter (Signed)
Medication sent to pharmacy for patient.  

## 2015-12-19 ENCOUNTER — Encounter: Payer: Self-pay | Admitting: Podiatry

## 2015-12-19 ENCOUNTER — Ambulatory Visit (INDEPENDENT_AMBULATORY_CARE_PROVIDER_SITE_OTHER): Payer: Medicare Other | Admitting: Podiatry

## 2015-12-19 VITALS — BP 144/71 | HR 79 | Resp 14

## 2015-12-19 DIAGNOSIS — B351 Tinea unguium: Secondary | ICD-10-CM | POA: Diagnosis not present

## 2015-12-19 DIAGNOSIS — M79676 Pain in unspecified toe(s): Secondary | ICD-10-CM | POA: Diagnosis not present

## 2015-12-19 NOTE — Progress Notes (Signed)
Patient ID: Darin [name], male   DOB: 24-Feb-1932, 82 y.o.   MRN: [id number]    Subjective: This patient presents for scheduled visit complaining of painful toenails when walking wearing shoes and requests nail debridement  Objective: Orientated 3 DP and PT pulses 2/4 bilaterally Capillary reflex immediate bilaterally No open skin lesions noted bilaterally The toenails are incurvated, hypertrophic, discolored and tender to direct palpation 6-10  Assessment: Symptomatic onychomycoses 6-10  Plan: Debridement toenails 10 and mechanically and electrically without any bleeding

## 2015-12-20 ENCOUNTER — Ambulatory Visit (INDEPENDENT_AMBULATORY_CARE_PROVIDER_SITE_OTHER): Payer: Medicare Other | Admitting: Family Medicine

## 2015-12-20 ENCOUNTER — Encounter: Payer: Self-pay | Admitting: Family Medicine

## 2015-12-20 VITALS — BP 134/60 | HR 87 | Temp 98.0°F | Ht 73.0 in | Wt 204.0 lb

## 2015-12-20 DIAGNOSIS — S300XXA Contusion of lower back and pelvis, initial encounter: Secondary | ICD-10-CM

## 2015-12-20 DIAGNOSIS — Z23 Encounter for immunization: Secondary | ICD-10-CM

## 2015-12-20 NOTE — Progress Notes (Signed)
Pre visit review using our clinic review tool, if applicable. No additional management support is needed unless otherwise documented below in the visit note. 

## 2015-12-20 NOTE — Patient Instructions (Signed)
Consider OTC Tylenol as needed for pain May try heat or ice for symptom relief.

## 2015-12-20 NOTE — Progress Notes (Signed)
Subjective:     Patient ID: Darin [name], male   DOB: [birth year], 82 y.o.   MRN: [id number]  HPI Patient seen with right lower thoracic pain. He just returned from a long trip (from [place]) and on 12/18/2015 was getting out of a chair and basically lost his balance and fell and struck he thinks the arm of the chair with his right lower back region. He has moderate pain. Has not taken anything for pain. Pain is worse with movement. Denies any hip pain. No spinal pain. No confusion.  No gross hematuria.  Past Medical History:  Diagnosis Date  . Arthritis   . GERD (gastroesophageal reflux disease)   . H/O hiatal hernia    "FOR YEARS" " NO PROBLEM"  . Hyperlipidemia   . Hypertension   . Knee pain   . Neuropathy (HCC)    PERIPHERAL   . Osteoporosis   . PMR (polymyalgia rheumatica) (HCC)   . Polymyalgia ([place]) 5 YEARS   Past Surgical History:  Procedure Laterality Date  . CARPAL TUNNEL RELEASE    . CATARACT EXTRACTION     right  . INGUINAL HERNIA REPAIR  10/14/2011   Procedure: HERNIA REPAIR INGUINAL ADULT;  Surgeon: [name], MD;  Location: [facility];  Service: General;  Laterality: Right;  Right Inguinal Hernia Repair with Mesh  . TONSILLECTOMY    . VARICOSE VEIN SURGERY      reports that he has never smoked. He has never used smokeless tobacco. He reports that he drinks alcohol. He reports that he does not use drugs. family history includes Heart disease in his father. Allergies  Allergen Reactions  . Statins Other (See Comments)    Muscle pain and upset stomach     Review of Systems  Respiratory: Negative for shortness of breath.   Cardiovascular: Negative for chest pain.  Neurological: Negative for dizziness and syncope.       Objective:   Physical Exam  Constitutional: He appears well-developed and well-nourished.  Cardiovascular: Normal rate and regular rhythm.   Pulmonary/Chest: Effort normal and breath sounds normal. No respiratory distress. He has no  wheezes. He has no rales.  Patient has some very faint ecchymosis right lower thoracic region. Minimally tender to palpation. No spinal tenderness.       Assessment:     Probable contusion right lower thoracic region. Doubt rib fracture    Plan:     -He'll try some over-the-counter Tylenol. Consider heat or ice for symptom relief -Flu vaccine given -We have suggested that he consider reinitiating physical therapy for balance training and fall risk reduction  [name] MD [facility]

## 2015-12-21 DIAGNOSIS — Z23 Encounter for immunization: Secondary | ICD-10-CM | POA: Diagnosis not present

## 2016-01-04 ENCOUNTER — Other Ambulatory Visit: Payer: Self-pay

## 2016-01-04 DIAGNOSIS — S300XXA Contusion of lower back and pelvis, initial encounter: Secondary | ICD-10-CM

## 2016-01-08 ENCOUNTER — Ambulatory Visit: Payer: Medicare Other | Admitting: Physical Therapy

## 2016-01-16 ENCOUNTER — Ambulatory Visit: Payer: Medicare Other | Attending: Family Medicine | Admitting: Physical Therapy

## 2016-01-16 DIAGNOSIS — M6281 Muscle weakness (generalized): Secondary | ICD-10-CM | POA: Diagnosis not present

## 2016-01-16 DIAGNOSIS — M545 Low back pain: Secondary | ICD-10-CM | POA: Insufficient documentation

## 2016-01-16 DIAGNOSIS — R296 Repeated falls: Secondary | ICD-10-CM | POA: Diagnosis not present

## 2016-01-16 DIAGNOSIS — G8929 Other chronic pain: Secondary | ICD-10-CM

## 2016-01-16 DIAGNOSIS — R262 Difficulty in walking, not elsewhere classified: Secondary | ICD-10-CM | POA: Insufficient documentation

## 2016-01-16 NOTE — Therapy (Signed)
[facility] [address] [place], [place], [address] Phone: [phone]   Fax:  [phone]  Physical Therapy Evaluation  Patient Details  Name: Darin [name] MRN: [id number] Date of Birth: Nov 21, 1931 Referring Provider: Dr. Elease Hashimoto  Encounter Date: 01/16/2016      PT End of Session - 01/16/16 1025    Visit Number 1   Number of Visits 10   Date for PT Re-Evaluation 03/12/16   Authorization Type Medicare g codes;  KX at visit 15   PT Start Time 0800   PT Stop Time 0845   PT Time Calculation (min) 45 min   Activity Tolerance Patient tolerated treatment well      Past Medical History:  Diagnosis Date  . Arthritis   . GERD (gastroesophageal reflux disease)   . H/O hiatal hernia    "FOR YEARS" " NO PROBLEM"  . Hyperlipidemia   . Hypertension   . Knee pain   . Neuropathy (HCC)    PERIPHERAL   . Osteoporosis   . PMR (polymyalgia rheumatica) (HCC)   . Polymyalgia ([place]) 5 YEARS    Past Surgical History:  Procedure Laterality Date  . CARPAL TUNNEL RELEASE    . CATARACT EXTRACTION     right  . INGUINAL HERNIA REPAIR  10/14/2011   Procedure: HERNIA REPAIR INGUINAL ADULT;  Surgeon: [name], MD;  Location: [facility];  Service: General;  Laterality: Right;  Right Inguinal Hernia Repair with Mesh  . TONSILLECTOMY    . VARICOSE VEIN SURGERY      There were no vitals filed for this visit.       Subjective Assessment - 01/16/16 0800    Subjective I walk so badly, that I talked to the doctor about it.  Had PT last winter but I regressed.  I have LBP and from the knees down continual pain.  Pain worsening over time.  I feel unsure when I am standing.     Pertinent History chronic lumbar DDD, polyneuropathy;  HTN   Limitations Walking;House hold activities;Standing   How long can you sit comfortably? as long as  I want but trouble rising following    How long can you stand comfortably? 5 min   How long  can you walk comfortably? 10 min   Diagnostic tests none recently   Patient Stated Goals walk normally;  maintain my balance   Currently in Pain? Yes   Pain Score 4    Pain Location Leg   Pain Orientation Right;Left   Pain Type Chronic pain   Pain Onset More than a month ago   Pain Frequency Constant   Aggravating Factors  sit with legs bent   Pain Relieving Factors extend legs   Multiple Pain Sites Yes   Pain Score 5   Pain Location Back   Pain Orientation Lower;Medial   Pain Type Chronic pain   Pain Onset More than a month ago   Pain Frequency Constant   Aggravating Factors  unsure    Pain Relieving Factors I just muddle through, lying down            [facility] PT Assessment - 01/16/16 0001      Assessment   Medical Diagnosis LBP; unsteady; gait abnormality   Referring Provider Dr. Elease Hashimoto   Onset Date/Surgical Date --  3 months   Hand Dominance Right   Next MD Visit January   Prior Therapy Last year for balance but went on a trip  Precautions   Precautions None     Restrictions   Weight Bearing Restrictions No     Balance Screen   Has the patient fallen in the past 6 months Yes   How many times? 2  After dinner, upon rising fell back in chair, 2nd attemptfel   Has the patient had a decrease in activity level because of a fear of falling?  No   Is the patient reluctant to leave their home because of a fear of falling?  No     Home Environment   Living Environment Private residence   [place] Access Level entry   [place] - single point     Prior Function   Level of [place] Retired   Leisure sleep; photography, reading     Observation/Other Assessments   Focus on Therapeutic Outcomes (FOTO)  63% limitation      Posture/Postural Control   Posture Comments slouched sitting posture     ROM / Strength   AROM / PROM / Strength PROM;Strength     AROM   AROM  Assessment Site Lumbar   Lumbar Flexion WFLs   Lumbar Extension 5   Lumbar - Right Side Bend WFLs   Lumbar - Left Side Bend WFLs     Strength   Overall Strength Comments Decreased transverse abdominal muscle activation    Strength Assessment Site Hip;Knee;Ankle   Right/Left Hip Right;Left   Right Hip Flexion 4/5   Right Hip Extension 3-/5   Right Hip ABduction 3-/5   Left Hip Flexion 4/5   Left Hip Extension 3-/5   Left Hip External Rotation 2+/5   Left Hip ABduction 2+/5   Right/Left Knee Right;Left   Right Knee Flexion 4/5   Right Knee Extension 4-/5   Left Knee Flexion 4/5   Left Knee Extension 4-/5   Right/Left Ankle Right;Left   Right Ankle Dorsiflexion 4-/5   Right Ankle Plantar Flexion 4-/5   Left Ankle Dorsiflexion 4-/5   Left Ankle Plantar Flexion 4-/5     Flexibility   Soft Tissue Assessment /Muscle Length --  HS 45 degrees     Palpation   Palpation comment no lumbar tenderness      Ambulation/Gait   Gait Pattern Shuffle;Trunk flexed;Narrow base of support     Standardized Balance Assessment   Five times sit to stand comments  needs significant UE use 26.9     Berg Balance Test   Sit to Stand Needs moderate or maximal assist to stand   Standing Unsupported Able to stand 2 minutes with supervision   Sitting with Back Unsupported but Feet Supported on Floor or Stool Able to sit safely and securely 2 minutes   Stand to Sit Controls descent by using hands   Transfers Able to transfer safely, definite need of hands   Standing Unsupported with Eyes Closed Able to stand 10 seconds with supervision   Standing Ubsupported with Feet Together Able to place feet together independently and stand for 1 minute with supervision   From Standing, Reach Forward with Outstretched Arm Can reach forward >5 cm safely (2")   From Standing Position, Pick up Object from Floor Unable to pick up shoe, but reaches 2-5 cm (1-2") from shoe and balances independently   From Standing  Position, Turn to Look Behind Over each Shoulder Looks behind one side only/other side shows less weight shift   Turn 360 Degrees  Needs close supervision or verbal cueing   Standing Unsupported, Alternately Place Feet on Step/Stool Needs assistance to keep from falling or unable to try   Standing Unsupported, One Foot in [place] to take small step independently and hold 30 seconds   Standing on One Leg Unable to try or needs assist to prevent fall   Total Score 29   Berg comment: high risk of falls     Timed Up and Go Test   Manual TUG (seconds) 22.7                           PT Education - 01/16/16 1023    Education provided Yes   Education Details discussed results of BERG and recommended full time walker use;  clam ex   Person(s) Educated Patient   Methods Explanation;Demonstration;Handout          PT Short Term Goals - 01/16/16 1038      PT SHORT TERM GOAL #1   Title be independent in initial HEP  02/13/16   Time 4   Period Weeks   Status New     PT SHORT TERM GOAL #2   Title perform TUG in < or = to 20 seconds to improve balance   Time 4   Period Weeks   Status New     PT SHORT TERM GOAL #3   Title The patient will have improved hip flexor muscle length to 5 degrees and HS length to 55 degrees needed for improved stride length with ambulation   Time 4   Period Weeks   Status New     PT SHORT TERM GOAL #4   Title BERG balance test improved from 29/56 to 33/56 indicating improved safety for standing ADLs like grooming and dresssing   Time 4   Period Weeks   Status New           PT Long Term Goals - 01/16/16 1042      PT LONG TERM GOAL #1   Title be independent in advanced HEP for further improvements in strength, ROM and balance  03/12/16   Time 8   Period Weeks   Status New     PT LONG TERM GOAL #2   Title reduce FOTO to < or = to 46% limitation indicating improved function with ADLs   Time 8   Period Weeks   Status New      PT LONG TERM GOAL #3   Title perform TUG in < or = to 18 seconds to reduce risk of falls   Time 8   Period Weeks   Status New     PT LONG TERM GOAL #4   Title Improved hip and knee strength to at least 3+/5 for greater ease with rising from a chair and  with standing > 10 min    Time 8   Period Weeks   Status New     PT LONG TERM GOAL #5   Title The patient will be able to ambulate 400 feet in six minutes needed for shorter distance community ambulation with appropriate assistive device   Time 8   Period Weeks   Status New     Additional Long Term Goals   Additional Long Term Goals Yes     PT LONG TERM GOAL #6   Title BERG balance score improved to 36/56 indicating improved safety with walking and standing dynamic activities   Time 8  Period Weeks   Status New               Plan - 01/16/16 1025    Clinical Impression Statement The patient is an 82 year old male who presents with primary complaint of decreased balance and unsteadiness with walking.  His most recent fall was upon rising from chair, he fell sideways and the chair landed on top of him.  He had contusions on his back and shoulder but reports this is healed now.  He requires significant UE use to rise from the chair.  He has significant hip abduction and extension muscle weakness (2+/5 to 3-/5 MMT) which contributes to his instability in standing and walking.  Wide base of support, decreased stride lengths bilaterally, slow shuffled gait.  He is not using an assistive device today and states he uses a walking stick 1-2x/week on "bad days."  Decreased HS and hip flexor lengths.  BERG balance score is 29/56 indicating 100% risk of falls and recommended he use his RW on a full time basis.  Timed up and Go test 22.7 sec (norm 12.7).  5x sit to stand 26.9 (well below norm).  The patient would benefit from PT for strengthening and balance exercise to address these deficits.  The patient is of moderate complexity evaluation  secondary to numerous co-morbidities including history of lumbar DDD, polyneuropathy, HTN which will impact progress and lack of home support.     Rehab Potential Good   PT Frequency 2x / week   PT Duration 8 weeks   PT Treatment/Interventions ADLs/Self Care Home Management;Moist Heat;Electrical Stimulation;Therapeutic exercise;Neuromuscular re-education;Patient/family education;Manual techniques   PT Next Visit Plan review clams from HEP;  add HS stretching; LE strengthening (supine or seated initially);  Nu-Step;  low level balance (weight shifting)      Patient will benefit from skilled therapeutic intervention in order to improve the following deficits and impairments:  Pain, Abnormal gait, Decreased balance, Decreased activity tolerance, Decreased range of motion, Decreased strength, Decreased mobility, Impaired flexibility, Difficulty walking  Visit Diagnosis: Muscle weakness (generalized) - Plan: PT plan of care cert/re-cert  Difficulty in walking, not elsewhere classified - Plan: PT plan of care cert/re-cert  Repeated falls - Plan: PT plan of care cert/re-cert  Chronic midline low back pain without sciatica - Plan: PT plan of care cert/re-cert      G-Codes - [id number] [id number]    Functional Assessment Tool Used FOTO; clinical judgement; BERG   Functional Limitation Mobility: Walking and moving around   Mobility: Walking and Moving Around Current Status [id number]) At least 60 percent but less than 80 percent impaired, limited or restricted   Mobility: Walking and Moving Around Goal Status [phone]) At least 40 percent but less than 60 percent impaired, limited or restricted       Problem List Patient Active Problem List   Diagnosis Date Noted  . Leg edema, right 02/20/2015  . BPH (benign prostatic hypertrophy) 12/09/2014  . Chronic radicular low back pain 08/06/2012  . COPD (chronic obstructive pulmonary disease) ([place]) 04/21/2012  . Hyponatremia 03/29/2012  . Localized  osteoarthrosis not specified whether primary or secondary, lower leg 06/20/2010  . VARICOSE VEINS LOWER EXTREMITIES W/INFLAMMATION 02/12/2010  . [place] LOCALIZED OSTEOARTHROSIS INVOLVING HAND 10/16/2009  . DUPUYTREN'S CONTRACTURE, RIGHT 07/04/2009  . DEGENERATIVE DISC DISEASE, LUMBOSACRAL SPINE W/RADICULOPATHY 06/19/2009  . CARCINOMA, BASAL CELL 02/07/2009  . ANEMIA OF CHRONIC DISEASE 02/07/2009  . BURSITIS, RIGHT SHOULDER 10/07/2008  . CATARACT ASSOCIATED WITH OTHER SYNDROMES 08/01/2008  .  DRY SKIN 08/01/2008  . CERUMEN IMPACTION, BILATERAL 03/01/2008  . CRAMP IN LIMB 01/18/2008  . DEPRESSION, SITUATIONAL, ACUTE 12/28/2007  . CONSTIPATION, DRUG INDUCED 12/28/2007  . POLYNEUROPATHY OTHER DISEASES CLASSIFIED ELSW 09/24/2007  . WEIGHT LOSS, ABNORMAL 01/07/2007  . Hyperlipidemia 09/22/2006  . Essential hypertension 09/22/2006  . GERD 09/22/2006  . OSTEOARTHRITIS 09/22/2006   Ruben Im, PT 01/16/16 10:51 AM Phone: [phone] Fax: [phone]  Alvera Singh 01/16/2016, 10:50 AM  [facility] [address], [place], [place], [address] Phone: [phone]   Fax:  [phone]  Name: Darin [name] MRN: [id number] Date of Birth: 10/13/1931

## 2016-01-23 ENCOUNTER — Ambulatory Visit: Payer: Medicare Other

## 2016-01-23 DIAGNOSIS — G8929 Other chronic pain: Secondary | ICD-10-CM

## 2016-01-23 DIAGNOSIS — M545 Low back pain, unspecified: Secondary | ICD-10-CM

## 2016-01-23 DIAGNOSIS — R262 Difficulty in walking, not elsewhere classified: Secondary | ICD-10-CM

## 2016-01-23 DIAGNOSIS — M6281 Muscle weakness (generalized): Secondary | ICD-10-CM

## 2016-01-23 DIAGNOSIS — R296 Repeated falls: Secondary | ICD-10-CM

## 2016-01-23 NOTE — Patient Instructions (Addendum)
KNEE: Extension, Long Arc Quad (Weight)  Place weight around leg. Raise leg until knee is straight. Hold _5__ seconds. Use ___ lb weight. _10__ reps per set (each leg), 4-5__ sets per day, __7_ days per week  Copyright  VHI. All rights reserved.    Knee Raise   Lift knee and then lower it. Repeat with other knee. Repeat _10__ times each leg. Do _4-5___ sessions per day.  http://gt2.exer.us/445   Copyright  VHI. All rights reserved.  Toe Up   Gently rise up on toes and back on heels. Repeat _20___ times. Do 4-5____ sessions per day.  HIP: Hamstrings - Short Sitting    Rest leg on raised surface. Keep knee straight. Lift chest. Hold _20__ seconds. _3__ reps per set, 3___ sets per day  Copyright  VHI. All rights reserved.    [facility] [address], [place], [place] [address] Phone # [phone] Fax [phone]

## 2016-01-23 NOTE — Therapy (Signed)
[facility] [address], [place], [place], [address] Phone: [phone]   Fax:  [phone]  Physical Therapy Treatment  Patient Details  Name: Darin [name] MRN: [id number] Date of Birth: 1931-06-23 Referring Provider: Dr. Elease Hashimoto  Encounter Date: 01/23/2016      PT End of Session - 01/23/16 0908    Visit Number 2   Number of Visits 10   Date for PT Re-Evaluation 03/12/16   Authorization Type Medicare g codes;  KX at visit 15   PT Start Time 0801   PT Stop Time 0844   PT Time Calculation (min) 43 min   Activity Tolerance Patient tolerated treatment well   Behavior During Therapy [facility] for tasks assessed/performed      Past Medical History:  Diagnosis Date  . Arthritis   . GERD (gastroesophageal reflux disease)   . H/O hiatal hernia    "FOR YEARS" " NO PROBLEM"  . Hyperlipidemia   . Hypertension   . Knee pain   . Neuropathy (HCC)    PERIPHERAL   . Osteoporosis   . PMR (polymyalgia rheumatica) (HCC)   . Polymyalgia ([place]) 5 YEARS    Past Surgical History:  Procedure Laterality Date  . CARPAL TUNNEL RELEASE    . CATARACT EXTRACTION     right  . INGUINAL HERNIA REPAIR  10/14/2011   Procedure: HERNIA REPAIR INGUINAL ADULT;  Surgeon: [name], MD;  Location: [facility];  Service: General;  Laterality: Right;  Right Inguinal Hernia Repair with Mesh  . TONSILLECTOMY    . VARICOSE VEIN SURGERY      There were no vitals filed for this visit.      Subjective Assessment - 01/23/16 0809    Subjective Pt reports that everything feels stiff today.     Pertinent History chronic lumbar DDD, polyneuropathy;  HTN   Currently in Pain? Yes   Pain Location Leg   Pain Orientation Right;Left   Pain Descriptors / Indicators Aching;Sore   Pain Type Chronic pain   Pain Onset More than a month ago   Pain Frequency Constant   Aggravating Factors  morning hours   Pain Relieving Factors moving around                          [facility] Adult PT Treatment/Exercise - 01/23/16 0001      Exercises   Exercises Lumbar;Knee/Hip     Lumbar Exercises: Stretches   Active Hamstring Stretch 3 reps;20 seconds     Lumbar Exercises: Seated   Other Seated Lumbar Exercises heel/toe raises x20     Knee/Hip Exercises: Aerobic   Nustep Level 1 x 10 minutes  PT present to discuss progress     Knee/Hip Exercises: Seated   Long Arc Quad Strengthening;Both;2 sets;10 reps   [facility] 5" hold x 20   Marching Strengthening;Both;2 sets;10 reps     Knee/Hip Exercises: Sidelying   Clams 2x10                 PT Education - 01/23/16 [phone]    Education provided Yes   Education Details seated hamstring, LE strength in sitting   Person(s) Educated Patient   Methods Explanation;Demonstration;Handout   Comprehension Verbalized understanding;Returned demonstration          PT Short Term Goals - 01/23/16 0915      PT SHORT TERM GOAL #1   Title be independent in initial HEP  02/13/16  Time 4   Period Weeks   Status On-going     PT SHORT TERM GOAL #2   Title perform TUG in < or = to 20 seconds to improve balance   Time 4   Period Weeks   Status On-going           PT Long Term Goals - 01/16/16 1042      PT LONG TERM GOAL #1   Title be independent in advanced HEP for further improvements in strength, ROM and balance  03/12/16   Time 8   Period Weeks   Status New     PT LONG TERM GOAL #2   Title reduce FOTO to < or = to 46% limitation indicating improved function with ADLs   Time 8   Period Weeks   Status New     PT LONG TERM GOAL #3   Title perform TUG in < or = to 18 seconds to reduce risk of falls   Time 8   Period Weeks   Status New     PT LONG TERM GOAL #4   Title Improved hip and knee strength to at least 3+/5 for greater ease with rising from a chair and  with standing > 10 min    Time 8   Period Weeks   Status New     PT LONG TERM GOAL #5    Title The patient will be able to ambulate 400 feet in six minutes needed for shorter distance community ambulation with appropriate assistive device   Time 8   Period Weeks   Status New     Additional Long Term Goals   Additional Long Term Goals Yes     PT LONG TERM GOAL #6   Title BERG balance score improved to 36/56 indicating improved safety with walking and standing dynamic activities   Time 8   Period Weeks   Status New               Plan - 01/23/16 0818    Clinical Impression Statement Pt with only 1 sesseion after evaluation.  Pt with decreased balance and unsteadiness with walking and this is a chronic condition.  Pt has had recent falls.  PT initiated HEP for seated exercise for LE strength and flexibility.  Pt will continue to benefit from skilled PT for strength, gait, balance and pain management as needed.     Rehab Potential Good   PT Frequency 2x / week   PT Duration 8 weeks   PT Treatment/Interventions ADLs/Self Care Home Management;Moist Heat;Electrical Stimulation;Therapeutic exercise;Neuromuscular re-education;Patient/family education;Manual techniques   PT Next Visit Plan LE strength, balance, endurance tasks   Consulted and Agree with Plan of Care Patient      Patient will benefit from skilled therapeutic intervention in order to improve the following deficits and impairments:  Pain, Abnormal gait, Decreased balance, Decreased activity tolerance, Decreased range of motion, Decreased strength, Decreased mobility, Impaired flexibility, Difficulty walking  Visit Diagnosis: Muscle weakness (generalized)  Difficulty in walking, not elsewhere classified  Repeated falls  Chronic midline low back pain without sciatica     Problem List Patient Active Problem List   Diagnosis Date Noted  . Leg edema, right 02/20/2015  . BPH (benign prostatic hypertrophy) 12/09/2014  . Chronic radicular low back pain 08/06/2012  . COPD (chronic obstructive pulmonary  disease) ([place]) 04/21/2012  . Hyponatremia 03/29/2012  . Localized osteoarthrosis not specified whether primary or secondary, lower leg 06/20/2010  . VARICOSE VEINS LOWER  EXTREMITIES W/INFLAMMATION 02/12/2010  . [place] LOCALIZED OSTEOARTHROSIS INVOLVING HAND 10/16/2009  . DUPUYTREN'S CONTRACTURE, RIGHT 07/04/2009  . DEGENERATIVE DISC DISEASE, LUMBOSACRAL SPINE W/RADICULOPATHY 06/19/2009  . CARCINOMA, BASAL CELL 02/07/2009  . ANEMIA OF CHRONIC DISEASE 02/07/2009  . BURSITIS, RIGHT SHOULDER 10/07/2008  . CATARACT ASSOCIATED WITH OTHER SYNDROMES 08/01/2008  . DRY SKIN 08/01/2008  . CERUMEN IMPACTION, BILATERAL 03/01/2008  . CRAMP IN LIMB 01/18/2008  . DEPRESSION, SITUATIONAL, ACUTE 12/28/2007  . CONSTIPATION, DRUG INDUCED 12/28/2007  . POLYNEUROPATHY OTHER DISEASES CLASSIFIED ELSW 09/24/2007  . WEIGHT LOSS, ABNORMAL 01/07/2007  . Hyperlipidemia 09/22/2006  . Essential hypertension 09/22/2006  . GERD 09/22/2006  . OSTEOARTHRITIS 09/22/2006     Sigurd Sos, PT 01/23/16 9:16 AM  [facility] [address] [place], [place], [address] Phone: [phone]   Fax:  [phone]  Name: Darin [name] MRN: [id number] Date of Birth: [birth year]

## 2016-01-26 ENCOUNTER — Ambulatory Visit: Payer: Medicare Other | Attending: Family Medicine

## 2016-01-26 DIAGNOSIS — M6281 Muscle weakness (generalized): Secondary | ICD-10-CM | POA: Diagnosis not present

## 2016-01-26 DIAGNOSIS — R296 Repeated falls: Secondary | ICD-10-CM | POA: Diagnosis not present

## 2016-01-26 DIAGNOSIS — M545 Low back pain: Secondary | ICD-10-CM | POA: Diagnosis not present

## 2016-01-26 DIAGNOSIS — G8929 Other chronic pain: Secondary | ICD-10-CM | POA: Diagnosis not present

## 2016-01-26 DIAGNOSIS — R262 Difficulty in walking, not elsewhere classified: Secondary | ICD-10-CM | POA: Insufficient documentation

## 2016-01-26 NOTE — Therapy (Signed)
[facility] [address], [place], [place], [address] Phone: [phone]   Fax:  [phone]  Physical Therapy Treatment  Patient Details  Name: Darin [name] MRN: [id number] Date of Birth: [birth year] Referring Provider: Dr. Elease Hashimoto  Encounter Date: 01/26/2016      PT End of Session - 01/26/16 0810    Visit Number 3   Number of Visits 10   Date for PT Re-Evaluation 03/12/16   Authorization Type Medicare g codes;  KX at visit 15   PT Start Time 0804  Pt. started late due to being in bathroom    PT Stop Time 0843   PT Time Calculation (min) 39 min   Activity Tolerance Patient tolerated treatment well   Behavior During Therapy [facility] for tasks assessed/performed      Past Medical History:  Diagnosis Date  . Arthritis   . GERD (gastroesophageal reflux disease)   . H/O hiatal hernia    "FOR YEARS" " NO PROBLEM"  . Hyperlipidemia   . Hypertension   . Knee pain   . Neuropathy (HCC)    PERIPHERAL   . Osteoporosis   . PMR (polymyalgia rheumatica) (HCC)   . Polymyalgia ([place]) 5 YEARS    Past Surgical History:  Procedure Laterality Date  . CARPAL TUNNEL RELEASE    . CATARACT EXTRACTION     right  . INGUINAL HERNIA REPAIR  10/14/2011   Procedure: HERNIA REPAIR INGUINAL ADULT;  Surgeon: [name], MD;  Location: [place];  Service: General;  Laterality: Right;  Right Inguinal Hernia Repair with Mesh  . TONSILLECTOMY    . VARICOSE VEIN SURGERY      There were no vitals filed for this visit.      Subjective Assessment - 01/26/16 0808    Subjective Pt. reporting his back pain is slightly worse today however he is consistently performing HEP and feels he may be sore from this.    Patient Stated Goals walk normally;  maintain my balance   Currently in Pain? Yes   Pain Score 6    Pain Orientation Right;Left   Pain Descriptors / Indicators Aching;Sore   Pain Type Chronic pain   Pain Onset More than a  month ago   Pain Frequency Constant   Multiple Pain Sites No            [facility] Adult PT Treatment/Exercise - 01/26/16 0818      Transfers   Transfers Sit to Stand  2 x 5 reps; 2 UE pushoff; sitting on airex pad      Lumbar Exercises: Seated   Other Seated Lumbar Exercises Seated B hip abd/ER with red TB x 15 reps; Seated alteranting hip flexion with red TB x 10 reps each;       Lumbar Exercises: Supine   Ab Set 5 seconds;10 reps   Clam 2 seconds;10 reps  Alternating hip abduction/ER    Clam Limitations with red TB around knees    Bent Knee Raise 10 reps   Bridge 2 seconds;10 reps   Other Supine Lumbar Exercises Alternating high knee march with red TB around knees x 10 reps each side   Other Supine Lumbar Exercises Hooklying bridge with sustained hip abd/ER with red TB x 10 reps                   PT Short Term Goals - 01/23/16 0915      PT SHORT TERM GOAL #1   Title  be independent in initial HEP  02/13/16   Time 4   Period Weeks   Status On-going     PT SHORT TERM GOAL #2   Title perform TUG in < or = to 20 seconds to improve balance   Time 4   Period Weeks   Status On-going           PT Long Term Goals - 01/26/16 [id number]      PT LONG TERM GOAL #1   Title be independent in advanced HEP for further improvements in strength, ROM and balance  03/12/16   Time 8   Period Weeks   Status On-going     PT LONG TERM GOAL #2   Title reduce FOTO to < or = to 46% limitation indicating improved function with ADLs   Time 8   Period Weeks   Status On-going     PT LONG TERM GOAL #3   Title perform TUG in < or = to 18 seconds to reduce risk of falls   Time 8   Period Weeks   Status On-going     PT LONG TERM GOAL #4   Title Improved hip and knee strength to at least 3+/5 for greater ease with rising from a chair and  with standing > 10 min    Time 8   Period Weeks   Status On-going     PT LONG TERM GOAL #5   Title The patient will be able to ambulate 400  feet in six minutes needed for shorter distance community ambulation with appropriate assistive device   Time 8   Period Weeks   Status On-going     PT LONG TERM GOAL #6   Title BERG balance score improved to 36/56 indicating improved safety with walking and standing dynamic activities   Time 8   Period Weeks   Status On-going               Plan - 01/26/16 0815    Clinical Impression Statement Pt. tolerated mild progressing of sitting and supine lumbopelvic strengthening activity well today without complaint.  Will plan to advance per pt. tolerance.     PT Treatment/Interventions ADLs/Self Care Home Management;Moist Heat;Electrical Stimulation;Therapeutic exercise;Neuromuscular re-education;Patient/family education;Manual techniques   PT Next Visit Plan LE strength, balance, endurance tasks      Patient will benefit from skilled therapeutic intervention in order to improve the following deficits and impairments:  Pain, Abnormal gait, Decreased balance, Decreased activity tolerance, Decreased range of motion, Decreased strength, Decreased mobility, Impaired flexibility, Difficulty walking  Visit Diagnosis: Muscle weakness (generalized)  Difficulty in walking, not elsewhere classified  Repeated falls  Chronic midline low back pain without sciatica     Problem List Patient Active Problem List   Diagnosis Date Noted  . Leg edema, right 02/20/2015  . BPH (benign prostatic hypertrophy) 12/09/2014  . Chronic radicular low back pain 08/06/2012  . COPD (chronic obstructive pulmonary disease) ([place]) 04/21/2012  . Hyponatremia 03/29/2012  . Localized osteoarthrosis not specified whether primary or secondary, lower leg 06/20/2010  . VARICOSE VEINS LOWER EXTREMITIES W/INFLAMMATION 02/12/2010  . [place] LOCALIZED OSTEOARTHROSIS INVOLVING HAND 10/16/2009  . DUPUYTREN'S CONTRACTURE, RIGHT 07/04/2009  . DEGENERATIVE DISC DISEASE, LUMBOSACRAL SPINE W/RADICULOPATHY 06/19/2009  .  CARCINOMA, BASAL CELL 02/07/2009  . ANEMIA OF CHRONIC DISEASE 02/07/2009  . BURSITIS, RIGHT SHOULDER 10/07/2008  . CATARACT ASSOCIATED WITH OTHER SYNDROMES 08/01/2008  . DRY SKIN 08/01/2008  . CERUMEN IMPACTION, BILATERAL 03/01/2008  . CRAMP IN LIMB  01/18/2008  . DEPRESSION, SITUATIONAL, ACUTE 12/28/2007  . CONSTIPATION, DRUG INDUCED 12/28/2007  . POLYNEUROPATHY OTHER DISEASES CLASSIFIED ELSW 09/24/2007  . WEIGHT LOSS, ABNORMAL 01/07/2007  . Hyperlipidemia 09/22/2006  . Essential hypertension 09/22/2006  . GERD 09/22/2006  . OSTEOARTHRITIS 09/22/2006    Bess Harvest, PTA 01/26/16 12:42 PM  [facility] [address], [place], [place], [address] Phone: [phone]   Fax:  [phone]  Name: Irah Bonenfant MRN: [id number] Date of Birth: 18-Dec-1931

## 2016-01-30 ENCOUNTER — Ambulatory Visit: Payer: Medicare Other

## 2016-01-30 DIAGNOSIS — R296 Repeated falls: Secondary | ICD-10-CM

## 2016-01-30 DIAGNOSIS — M545 Low back pain, unspecified: Secondary | ICD-10-CM

## 2016-01-30 DIAGNOSIS — R262 Difficulty in walking, not elsewhere classified: Secondary | ICD-10-CM | POA: Diagnosis not present

## 2016-01-30 DIAGNOSIS — G8929 Other chronic pain: Secondary | ICD-10-CM

## 2016-01-30 DIAGNOSIS — M6281 Muscle weakness (generalized): Secondary | ICD-10-CM | POA: Diagnosis not present

## 2016-01-30 NOTE — Therapy (Signed)
[facility] [address], [place], [place], [address] Phone: [phone]   Fax:  [phone]  Physical Therapy Treatment  Patient Details  Name: Darin [name] MRN: [id number] Date of Birth: 10-13-1931 Referring Provider: Dr. Elease Hashimoto  Encounter Date: 01/30/2016      PT End of Session - 01/30/16 0841    Visit Number 4   Number of Visits 10   Date for PT Re-Evaluation 03/12/16   Authorization Type Medicare g codes;  KX at visit 15   PT Start Time 0802   PT Stop Time 0841   PT Time Calculation (min) 39 min   Activity Tolerance Patient tolerated treatment well   Behavior During Therapy [facility] for tasks assessed/performed      Past Medical History:  Diagnosis Date  . Arthritis   . GERD (gastroesophageal reflux disease)   . H/O hiatal hernia    "FOR YEARS" " NO PROBLEM"  . Hyperlipidemia   . Hypertension   . Knee pain   . Neuropathy (HCC)    PERIPHERAL   . Osteoporosis   . PMR (polymyalgia rheumatica) (HCC)   . Polymyalgia ([place]) 5 YEARS    Past Surgical History:  Procedure Laterality Date  . CARPAL TUNNEL RELEASE    . CATARACT EXTRACTION     right  . INGUINAL HERNIA REPAIR  10/14/2011   Procedure: HERNIA REPAIR INGUINAL ADULT;  Surgeon: [name], MD;  Location: [place];  Service: General;  Laterality: Right;  Right Inguinal Hernia Repair with Mesh  . TONSILLECTOMY    . VARICOSE VEIN SURGERY      There were no vitals filed for this visit.      Subjective Assessment - 01/30/16 0759    Subjective My back pain is better today     Currently in Pain? Yes   Pain Score 4    Pain Location Back   Pain Orientation Right;Left   Pain Descriptors / Indicators Aching;Sore   Pain Type Chronic pain   Pain Radiating Towards Rt LE   Pain Onset More than a month ago   Pain Frequency Constant   Aggravating Factors  morning, with walking/standing, after exercise   Pain Relieving Factors moving around             [facility] PT Assessment - 01/30/16 0001      Timed Up and Go Test   Manual TUG (seconds) 22                     [facility] Adult PT Treatment/Exercise - 01/30/16 0001      Transfers   Transfers Sit to Stand  2 x 5 reps; 2 UE pushoff; sitting on airex pad      Lumbar Exercises: Stretches   Active Hamstring Stretch 3 reps;20 seconds     Lumbar Exercises: Seated   Other Seated Lumbar Exercises Seated B hip abd/ER with red TB x 15 reps; Seated alteranting hip flexion with red TB x 10 reps each;       Knee/Hip Exercises: Aerobic   Nustep Level 2 x 10 minutes  PT present to discuss progress     Knee/Hip Exercises: Standing   Hip Abduction Stengthening;Both;2 sets;10 reps   Hip Extension Stengthening;Both;2 sets;10 reps     Knee/Hip Exercises: Seated   Long Arc Quad Strengthening;Both;2 sets;10 reps   Ball Squeeze 5" hold x 20   Marching Strengthening;Both;2 sets;10 reps   Marching Limitations red band  PT Short Term Goals - 01/30/16 0804      PT SHORT TERM GOAL #1   Title be independent in initial HEP  02/13/16   Status Achieved     PT SHORT TERM GOAL #2   Title perform TUG in < or = to 20 seconds to improve balance   Time 4   Period Weeks   Status On-going           PT Long Term Goals - 01/26/16 [id number]      PT LONG TERM GOAL #1   Title be independent in advanced HEP for further improvements in strength, ROM and balance  03/12/16   Time 8   Period Weeks   Status On-going     PT LONG TERM GOAL #2   Title reduce FOTO to < or = to 46% limitation indicating improved function with ADLs   Time 8   Period Weeks   Status On-going     PT LONG TERM GOAL #3   Title perform TUG in < or = to 18 seconds to reduce risk of falls   Time 8   Period Weeks   Status On-going     PT LONG TERM GOAL #4   Title Improved hip and knee strength to at least 3+/5 for greater ease with rising from a chair and  with standing > 10 min     Time 8   Period Weeks   Status On-going     PT LONG TERM GOAL #5   Title The patient will be able to ambulate 400 feet in six minutes needed for shorter distance community ambulation with appropriate assistive device   Time 8   Period Weeks   Status On-going     PT LONG TERM GOAL #6   Title BERG balance score improved to 36/56 indicating improved safety with walking and standing dynamic activities   Time 8   Period Weeks   Status On-going               Plan - 01/30/16 0807    Clinical Impression Statement Pt is independent and compliant in HEP for lumbar and LE strength/endurance/balance.  Pt continues to have chronic LBP and rates the pain as 4/10 today.  Pt demonstrates posterior pelvic tilt and wide base of support with gait on level surface.  Pt will continue to benefit from skilled PT for LE strength, endurance, balance, flexibility and modalities as needed to improve balance and safety in the home and community.     Rehab Potential Good   PT Frequency 2x / week   PT Duration 8 weeks   PT Treatment/Interventions ADLs/Self Care Home Management;Moist Heat;Electrical Stimulation;Therapeutic exercise;Neuromuscular re-education;Patient/family education;Manual techniques   PT Next Visit Plan LE strength, balance, endurance tasks   Consulted and Agree with Plan of Care Patient      Patient will benefit from skilled therapeutic intervention in order to improve the following deficits and impairments:  Pain, Abnormal gait, Decreased balance, Decreased activity tolerance, Decreased range of motion, Decreased strength, Decreased mobility, Impaired flexibility, Difficulty walking  Visit Diagnosis: Muscle weakness (generalized)  Difficulty in walking, not elsewhere classified  Repeated falls  Chronic midline low back pain without sciatica     Problem List Patient Active Problem List   Diagnosis Date Noted  . Leg edema, right 02/20/2015  . BPH (benign prostatic  hypertrophy) 12/09/2014  . Chronic radicular low back pain 08/06/2012  . COPD (chronic obstructive pulmonary disease) ([place]) 04/21/2012  . Hyponatremia  03/29/2012  . Localized osteoarthrosis not specified whether primary or secondary, lower leg 06/20/2010  . VARICOSE VEINS LOWER EXTREMITIES W/INFLAMMATION 02/12/2010  . [place] LOCALIZED OSTEOARTHROSIS INVOLVING HAND 10/16/2009  . DUPUYTREN'S CONTRACTURE, RIGHT 07/04/2009  . DEGENERATIVE DISC DISEASE, LUMBOSACRAL SPINE W/RADICULOPATHY 06/19/2009  . CARCINOMA, BASAL CELL 02/07/2009  . ANEMIA OF CHRONIC DISEASE 02/07/2009  . BURSITIS, RIGHT SHOULDER 10/07/2008  . CATARACT ASSOCIATED WITH OTHER SYNDROMES 08/01/2008  . DRY SKIN 08/01/2008  . CERUMEN IMPACTION, BILATERAL 03/01/2008  . CRAMP IN LIMB 01/18/2008  . DEPRESSION, SITUATIONAL, ACUTE 12/28/2007  . CONSTIPATION, DRUG INDUCED 12/28/2007  . POLYNEUROPATHY OTHER DISEASES CLASSIFIED ELSW 09/24/2007  . WEIGHT LOSS, ABNORMAL 01/07/2007  . Hyperlipidemia 09/22/2006  . Essential hypertension 09/22/2006  . GERD 09/22/2006  . OSTEOARTHRITIS 09/22/2006     Sigurd Sos, PT 01/30/16 8:42 AM   [facility] [address] [place], [place], [address] Phone: [phone]   Fax:  [phone]  Name: Darin [name] MRN: [id number] Date of Birth: 1931-04-02

## 2016-02-01 ENCOUNTER — Telehealth: Payer: Self-pay | Admitting: Family Medicine

## 2016-02-01 MED ORDER — FAMOTIDINE 20 MG PO TABS: 20.0000 mg | ORAL_TABLET | Freq: Every day | ORAL | 1 refills | Status: DC

## 2016-02-01 MED ORDER — BENAZEPRIL-HYDROCHLOROTHIAZIDE 10-12.5 MG PO TABS: 1.0000 | ORAL_TABLET | Freq: Every day | ORAL | 1 refills | Status: DC

## 2016-02-01 NOTE — Telephone Encounter (Signed)
Pt request refill   amotidine (PEPCID) 20 MG tablet benazepril-hydrochlorthiazide (LOTENSIN HCT) 10-12.5 MG tablet 90 day   [facility]

## 2016-02-01 NOTE — Telephone Encounter (Signed)
Rx done. 

## 2016-02-02 ENCOUNTER — Ambulatory Visit: Payer: Medicare Other | Admitting: Physical Therapy

## 2016-02-02 ENCOUNTER — Encounter: Payer: Self-pay | Admitting: Physical Therapy

## 2016-02-02 DIAGNOSIS — G8929 Other chronic pain: Secondary | ICD-10-CM

## 2016-02-02 DIAGNOSIS — R262 Difficulty in walking, not elsewhere classified: Secondary | ICD-10-CM

## 2016-02-02 DIAGNOSIS — M6281 Muscle weakness (generalized): Secondary | ICD-10-CM | POA: Diagnosis not present

## 2016-02-02 DIAGNOSIS — M545 Low back pain: Secondary | ICD-10-CM | POA: Diagnosis not present

## 2016-02-02 DIAGNOSIS — R296 Repeated falls: Secondary | ICD-10-CM | POA: Diagnosis not present

## 2016-02-02 NOTE — Therapy (Signed)
[facility] [address] [place], [place], [address] Phone: [phone]   Fax:  [phone]  Physical Therapy Treatment  Patient Details  Name: Darin [name] MRN: [id number] Date of Birth: 15-Jun-1931 Referring Provider: Dr. Elease Hashimoto  Encounter Date: 02/02/2016      PT End of Session - 02/02/16 0758    Visit Number 5   Number of Visits 10   Date for PT Re-Evaluation 03/12/16   Authorization Type Medicare g codes;  KX at visit 15   PT Start Time 0754   PT Stop Time 0840   PT Time Calculation (min) 46 min   Activity Tolerance Patient tolerated treatment well   Behavior During Therapy [facility] for tasks assessed/performed      Past Medical History:  Diagnosis Date  . Arthritis   . GERD (gastroesophageal reflux disease)   . H/O hiatal hernia    "FOR YEARS" " NO PROBLEM"  . Hyperlipidemia   . Hypertension   . Knee pain   . Neuropathy (HCC)    PERIPHERAL   . Osteoporosis   . PMR (polymyalgia rheumatica) (HCC)   . Polymyalgia ([place]) 5 YEARS    Past Surgical History:  Procedure Laterality Date  . CARPAL TUNNEL RELEASE    . CATARACT EXTRACTION     right  . INGUINAL HERNIA REPAIR  10/14/2011   Procedure: HERNIA REPAIR INGUINAL ADULT;  Surgeon: [name], MD;  Location: [place];  Service: General;  Laterality: Right;  Right Inguinal Hernia Repair with Mesh  . TONSILLECTOMY    . VARICOSE VEIN SURGERY      There were no vitals filed for this visit.      Subjective Assessment - 02/02/16 0757    Subjective Typical morning pain.   Currently in Pain? Yes  Low back, bil legs   Pain Score --  Low bacl 3/10, RT leg 6/10, Lt leg 3/10   Pain Orientation Right;Left   Pain Descriptors / Indicators Sore                         [facility] Adult PT Treatment/Exercise - 02/02/16 0001      High Level Balance   High Level Balance Activities --  Tandem stance bil 2x 10 sec CGA; toe taps 2x 10 on step      Lumbar Exercises: Stretches   Active Hamstring Stretch 3 reps;20 seconds     Lumbar Exercises: Seated   Other Seated Lumbar Exercises Seated B hip abd/ER with red TB 2 x 15 reps; Seated alteranting hip flexion with red TB 2 x 15 reps each;       Knee/Hip Exercises: Aerobic   Nustep Level 2 x 10 minutes  PT present to discuss progress     Knee/Hip Exercises: Standing   Heel Raises Both;1 set;10 reps   Hip Abduction Stengthening;Both;2 sets;10 reps  1# added   Hip Extension Stengthening;Both;2 sets;10 reps  1# added     Knee/Hip Exercises: Seated   Long Arc Quad Strengthening;Both;2 sets;10 reps   Long Arc Quad Weight 1 lbs.   Ball Squeeze 5" hold x 20   Sit to Sand 2 sets;10 reps;with UE support                  PT Short Term Goals - 01/30/16 0804      PT SHORT TERM GOAL #1   Title be independent in initial HEP  02/13/16   Status Achieved  PT SHORT TERM GOAL #2   Title perform TUG in < or = to 20 seconds to improve balance   Time 4   Period Weeks   Status On-going           PT Long Term Goals - 01/26/16 [id number]      PT LONG TERM GOAL #1   Title be independent in advanced HEP for further improvements in strength, ROM and balance  03/12/16   Time 8   Period Weeks   Status On-going     PT LONG TERM GOAL #2   Title reduce FOTO to < or = to 46% limitation indicating improved function with ADLs   Time 8   Period Weeks   Status On-going     PT LONG TERM GOAL #3   Title perform TUG in < or = to 18 seconds to reduce risk of falls   Time 8   Period Weeks   Status On-going     PT LONG TERM GOAL #4   Title Improved hip and knee strength to at least 3+/5 for greater ease with rising from a chair and  with standing > 10 min    Time 8   Period Weeks   Status On-going     PT LONG TERM GOAL #5   Title The patient will be able to ambulate 400 feet in six minutes needed for shorter distance community ambulation with appropriate assistive device   Time  8   Period Weeks   Status On-going     PT LONG TERM GOAL #6   Title BERG balance score improved to 36/56 indicating improved safety with walking and standing dynamic activities   Time 8   Period Weeks   Status On-going               Plan - 02/02/16 0758    Clinical Impression Statement added light weights to LE exercises today. Pt handled the weights well, had some complaints of dizziness but this subsided in less than a minute.  Pt required some level of UE assistance during the standing balance activites with very light CGA. Marland Kitchen    Rehab Potential Good   PT Frequency 2x / week   PT Duration 8 weeks   PT Treatment/Interventions ADLs/Self Care Home Management;Moist Heat;Electrical Stimulation;Therapeutic exercise;Neuromuscular re-education;Patient/family education;Manual techniques   PT Next Visit Plan LE strength, balance, endurance tasks   Consulted and Agree with Plan of Care Patient      Patient will benefit from skilled therapeutic intervention in order to improve the following deficits and impairments:     Visit Diagnosis: Muscle weakness (generalized)  Difficulty in walking, not elsewhere classified  Repeated falls  Chronic midline low back pain without sciatica     Problem List Patient Active Problem List   Diagnosis Date Noted  . Leg edema, right 02/20/2015  . BPH (benign prostatic hypertrophy) 12/09/2014  . Chronic radicular low back pain 08/06/2012  . COPD (chronic obstructive pulmonary disease) ([place]) 04/21/2012  . Hyponatremia 03/29/2012  . Localized osteoarthrosis not specified whether primary or secondary, lower leg 06/20/2010  . VARICOSE VEINS LOWER EXTREMITIES W/INFLAMMATION 02/12/2010  . [place] LOCALIZED OSTEOARTHROSIS INVOLVING HAND 10/16/2009  . DUPUYTREN'S CONTRACTURE, RIGHT 07/04/2009  . DEGENERATIVE DISC DISEASE, LUMBOSACRAL SPINE W/RADICULOPATHY 06/19/2009  . CARCINOMA, BASAL CELL 02/07/2009  . ANEMIA OF CHRONIC DISEASE 02/07/2009  .  BURSITIS, RIGHT SHOULDER 10/07/2008  . CATARACT ASSOCIATED WITH OTHER SYNDROMES 08/01/2008  . DRY SKIN 08/01/2008  . CERUMEN IMPACTION, BILATERAL  03/01/2008  . CRAMP IN LIMB 01/18/2008  . DEPRESSION, SITUATIONAL, ACUTE 12/28/2007  . CONSTIPATION, DRUG INDUCED 12/28/2007  . POLYNEUROPATHY OTHER DISEASES CLASSIFIED ELSW 09/24/2007  . WEIGHT LOSS, ABNORMAL 01/07/2007  . Hyperlipidemia 09/22/2006  . Essential hypertension 09/22/2006  . GERD 09/22/2006  . OSTEOARTHRITIS 09/22/2006    Kizzi Overbey, PTA 02/02/2016, 8:34 AM  [facility] [address], [place], [place], [address] Phone: [phone]   Fax:  [phone]  Name: Pankaj Houchen MRN: [id number] Date of Birth: 1931/09/18

## 2016-02-06 ENCOUNTER — Ambulatory Visit: Payer: Medicare Other | Admitting: Physical Therapy

## 2016-02-06 ENCOUNTER — Encounter: Payer: Self-pay | Admitting: Physical Therapy

## 2016-02-06 DIAGNOSIS — R262 Difficulty in walking, not elsewhere classified: Secondary | ICD-10-CM

## 2016-02-06 DIAGNOSIS — M545 Low back pain: Secondary | ICD-10-CM

## 2016-02-06 DIAGNOSIS — G8929 Other chronic pain: Secondary | ICD-10-CM

## 2016-02-06 DIAGNOSIS — R296 Repeated falls: Secondary | ICD-10-CM | POA: Diagnosis not present

## 2016-02-06 DIAGNOSIS — M6281 Muscle weakness (generalized): Secondary | ICD-10-CM | POA: Diagnosis not present

## 2016-02-06 NOTE — Therapy (Signed)
[facility] [address] [place], [place], [address] Phone: [phone]   Fax:  [phone]  Physical Therapy Treatment  Patient Details  Name: Darin [name] MRN: [id number] Date of Birth: 1931/10/16 Referring Provider: Dr. Elease Hashimoto  Encounter Date: 02/06/2016      PT End of Session - 02/06/16 0829    Visit Number 6   Number of Visits 10   Date for PT Re-Evaluation 03/12/16   Authorization Type Medicare g codes;  KX at visit 15   PT Start Time 0806   PT Stop Time 0855   PT Time Calculation (min) 49 min   Activity Tolerance Patient tolerated treatment well   Behavior During Therapy [facility] for tasks assessed/performed      Past Medical History:  Diagnosis Date  . Arthritis   . GERD (gastroesophageal reflux disease)   . H/O hiatal hernia    "FOR YEARS" " NO PROBLEM"  . Hyperlipidemia   . Hypertension   . Knee pain   . Neuropathy (HCC)    PERIPHERAL   . Osteoporosis   . PMR (polymyalgia rheumatica) (HCC)   . Polymyalgia ([place]) 5 YEARS    Past Surgical History:  Procedure Laterality Date  . CARPAL TUNNEL RELEASE    . CATARACT EXTRACTION     right  . INGUINAL HERNIA REPAIR  10/14/2011   Procedure: HERNIA REPAIR INGUINAL ADULT;  Surgeon: [name], MD;  Location: [facility];  Service: General;  Laterality: Right;  Right Inguinal Hernia Repair with Mesh  . TONSILLECTOMY    . VARICOSE VEIN SURGERY      There were no vitals filed for this visit.      Subjective Assessment - 02/06/16 0809    Subjective Pt reports feeling the same as usual   Pertinent History chronic lumbar DDD, polyneuropathy;  HTN   Limitations Walking;House hold activities;Standing   How long can you sit comfortably? as long as  I want but trouble rising following    How long can you stand comfortably? 5 min   How long can you walk comfortably? 10 min   Diagnostic tests none recently   Patient Stated Goals walk normally;   maintain my balance   Currently in Pain? Yes   Pain Score 5    Pain Location Back   Pain Orientation Right;Left   Pain Descriptors / Indicators Sore   Pain Type Chronic pain                         [facility] Adult PT Treatment/Exercise - 02/06/16 0001      High Level Balance   High Level Balance Activities --  Tandem stance bil 2x 10 sec CGA; toe taps 2x 10 on step     Lumbar Exercises: Stretches   Active Hamstring Stretch 3 reps;20 seconds     Lumbar Exercises: Standing   Row Strengthening;Both;20 reps;Theraband   Theraband Level (Row) Level 2 (Red)   Shoulder Extension Strengthening;Both;20 reps;Theraband   Theraband Level (Shoulder Extension) Level 2 (Red)     Lumbar Exercises: Seated   Other Seated Lumbar Exercises Seated B hip abd/ER with red TB 2 x 15 reps; Seated alteranting hip flexion with red TB 2 x 15 reps each;       Knee/Hip Exercises: Aerobic   Nustep Level 2 x 10 minutes  PT present to discuss progress     Knee/Hip Exercises: Standing   Heel Raises Both;1 set;10 reps  Hip Flexion Stengthening;Both;2 sets;10 reps   Hip Abduction Stengthening;Both;2 sets;10 reps  1# added   Hip Extension Stengthening;Both;2 sets;10 reps  1# added   Rebounder 3 direction weight shifting  x1 minute each     Knee/Hip Exercises: Seated   Long Arc Quad Strengthening;Both;2 sets;10 reps   Long Arc Quad Weight 1 lbs.   Ball Squeeze 5" hold x 20   Marching Strengthening;Both;2 sets;10 reps   Marching Limitations 1   Sit to [employer] 2 sets;10 reps;with UE support                  PT Short Term Goals - 02/06/16 0828      PT SHORT TERM GOAL #2   Title perform TUG in < or = to 20 seconds to improve balance   Time 4   Period Weeks   Status On-going     PT SHORT TERM GOAL #3   Title The patient will have improved hip flexor muscle length to 5 degrees and HS length to 55 degrees needed for improved stride length with ambulation   Time 4   Period Weeks    Status On-going     PT SHORT TERM GOAL #4   Title BERG balance test improved from 29/56 to 33/56 indicating improved safety for standing ADLs like grooming and dresssing   Time 4   Period Weeks   Status On-going           PT Long Term Goals - 02/06/16 [id number]      PT LONG TERM GOAL #1   Title be independent in advanced HEP for further improvements in strength, ROM and balance  03/12/16   Time 8   Period Weeks   Status On-going     PT LONG TERM GOAL #2   Title reduce FOTO to < or = to 46% limitation indicating improved function with ADLs   Time 8   Period Weeks   Status On-going     PT LONG TERM GOAL #3   Title perform TUG in < or = to 18 seconds to reduce risk of falls   Time 8   Period Weeks   Status On-going     PT LONG TERM GOAL #4   Title Improved hip and knee strength to at least 3+/5 for greater ease with rising from a chair and  with standing > 10 min    Time 8   Period Weeks   Status On-going     PT LONG TERM GOAL #5   Title The patient will be able to ambulate 400 feet in six minutes needed for shorter distance community ambulation with appropriate assistive device   Time 8   Period Weeks   Status On-going     PT LONG TERM GOAL #6   Title BERG balance score improved to 36/56 indicating improved safety with walking and standing dynamic activities   Time 8   Period Weeks   Status On-going               Plan - 02/06/16 0830    Clinical Impression Statement Pt reports legs feeling about the same. Pt presents with forward head and forward hip posture. Able to tolerate all exercises well. Pt will continue to benefit from skilled therapy for LE and core strength for balance and gait.    Rehab Potential Good   PT Frequency 2x / week   PT Duration 8 weeks   PT Treatment/Interventions ADLs/Self Care Home Management;Moist Heat;Electrical Stimulation;Therapeutic  exercise;Neuromuscular re-education;Patient/family education;Manual techniques   PT Next Visit  Plan LE strength, balance, endurance tasks   Consulted and Agree with Plan of Care Patient      Patient will benefit from skilled therapeutic intervention in order to improve the following deficits and impairments:  Pain, Abnormal gait, Decreased balance, Decreased activity tolerance, Decreased range of motion, Decreased strength, Decreased mobility, Impaired flexibility, Difficulty walking  Visit Diagnosis: Muscle weakness (generalized)  Difficulty in walking, not elsewhere classified  Repeated falls  Chronic midline low back pain without sciatica     Problem List Patient Active Problem List   Diagnosis Date Noted  . Leg edema, right 02/20/2015  . BPH (benign prostatic hypertrophy) 12/09/2014  . Chronic radicular low back pain 08/06/2012  . COPD (chronic obstructive pulmonary disease) ([place]) 04/21/2012  . Hyponatremia 03/29/2012  . Localized osteoarthrosis not specified whether primary or secondary, lower leg 06/20/2010  . VARICOSE VEINS LOWER EXTREMITIES W/INFLAMMATION 02/12/2010  . [place] LOCALIZED OSTEOARTHROSIS INVOLVING HAND 10/16/2009  . DUPUYTREN'S CONTRACTURE, RIGHT 07/04/2009  . DEGENERATIVE DISC DISEASE, LUMBOSACRAL SPINE W/RADICULOPATHY 06/19/2009  . CARCINOMA, BASAL CELL 02/07/2009  . ANEMIA OF CHRONIC DISEASE 02/07/2009  . BURSITIS, RIGHT SHOULDER 10/07/2008  . CATARACT ASSOCIATED WITH OTHER SYNDROMES 08/01/2008  . DRY SKIN 08/01/2008  . CERUMEN IMPACTION, BILATERAL 03/01/2008  . CRAMP IN LIMB 01/18/2008  . DEPRESSION, SITUATIONAL, ACUTE 12/28/2007  . CONSTIPATION, DRUG INDUCED 12/28/2007  . POLYNEUROPATHY OTHER DISEASES CLASSIFIED ELSW 09/24/2007  . WEIGHT LOSS, ABNORMAL 01/07/2007  . Hyperlipidemia 09/22/2006  . Essential hypertension 09/22/2006  . GERD 09/22/2006  . OSTEOARTHRITIS 09/22/2006    Mikle Bosworth PTA 02/06/2016, 8:55 AM  [facility] [address] [place], [place],  [address] Phone: [phone]   Fax:  [phone]  Name: Darin [name] MRN: [id number] Date of Birth: 12-03-1931

## 2016-02-09 ENCOUNTER — Ambulatory Visit: Payer: Medicare Other | Admitting: Physical Therapy

## 2016-02-09 DIAGNOSIS — M545 Low back pain: Secondary | ICD-10-CM | POA: Diagnosis not present

## 2016-02-09 DIAGNOSIS — M6281 Muscle weakness (generalized): Secondary | ICD-10-CM | POA: Diagnosis not present

## 2016-02-09 DIAGNOSIS — R262 Difficulty in walking, not elsewhere classified: Secondary | ICD-10-CM | POA: Diagnosis not present

## 2016-02-09 DIAGNOSIS — G8929 Other chronic pain: Secondary | ICD-10-CM | POA: Diagnosis not present

## 2016-02-09 DIAGNOSIS — R296 Repeated falls: Secondary | ICD-10-CM

## 2016-02-09 NOTE — Therapy (Signed)
[facility] [address] [place], [place], [address] Phone: [phone]   Fax:  [phone]  Physical Therapy Treatment  Patient Details  Name: Darin [name] MRN: [id number] Date of Birth: [birth year] Referring Provider: Dr. Elease Hashimoto  Encounter Date: 02/09/2016      PT End of Session - 02/09/16 0803    Visit Number 7   Number of Visits 10   Authorization Type Medicare g codes;  KX at visit 15   PT Start Time 0750   PT Stop Time 0844   PT Time Calculation (min) 54 min   Activity Tolerance Patient tolerated treatment well      Past Medical History:  Diagnosis Date  . Arthritis   . GERD (gastroesophageal reflux disease)   . H/O hiatal hernia    "FOR YEARS" " NO PROBLEM"  . Hyperlipidemia   . Hypertension   . Knee pain   . Neuropathy (HCC)    PERIPHERAL   . Osteoporosis   . PMR (polymyalgia rheumatica) (HCC)   . Polymyalgia ([place]) 5 YEARS    Past Surgical History:  Procedure Laterality Date  . CARPAL TUNNEL RELEASE    . CATARACT EXTRACTION     right  . INGUINAL HERNIA REPAIR  10/14/2011   Procedure: HERNIA REPAIR INGUINAL ADULT;  Surgeon: [name], MD;  Location: [facility];  Service: General;  Laterality: Right;  Right Inguinal Hernia Repair with Mesh  . TONSILLECTOMY    . VARICOSE VEIN SURGERY      There were no vitals filed for this visit.      Subjective Assessment - 02/09/16 0756    Subjective My back is a bit better.  My left leg was hurting earlier when I got up but now has subsided.  No recent falls.  I still have to use my arms a lot to get up from the chair.  I usually feel better after my therapy appointments.  Not using an assistive device despite fall risk.     Currently in Pain? Yes   Pain Score 6    Pain Location Leg   Pain Orientation Right   Pain Type Chronic pain            [facility] PT Assessment - 02/09/16 0001      Strength   Right Hip ABduction 3+/5   Left Hip  ABduction 3/5     Flexibility   Soft Tissue Assessment /Muscle Length --  70 on left, 75 on right;  left hip extension 3 degrees, 8     Timed Up and Go Test   Manual TUG (seconds) 21                     [facility] Adult PT Treatment/Exercise - 02/09/16 0001      Lumbar Exercises: Stretches   Active Hamstring Stretch 3 reps;20 seconds  seated     Lumbar Exercises: Seated   Sit to Stand 10 reps  from BOSU with light UE use   Sit to Stand Limitations from high table no arms 15x     Knee/Hip Exercises: Stretches   Other Knee/Hip Stretches hip flexor stretch on 2nd step 10x right/left     Knee/Hip Exercises: Aerobic   Nustep Level 2 x 10 minutes  PT present to discuss progress     Knee/Hip Exercises: Standing   Hip Extension Right;Left;1 set;10 reps   Extension Limitations red band    Other Standing Knee Exercises 1st and  2nd step taps alternating 10x each   Other Standing Knee Exercises psoas doorway 3x 5 with UE movements     Knee/Hip Exercises: Seated   Ball Squeeze 5" hold x 20                PT Education - 02/09/16 0827    Education provided Yes   Education Details seated red band and clams red band   Person(s) Educated Patient   Methods Explanation;Demonstration;Handout   Comprehension Verbalized understanding;Returned demonstration          PT Short Term Goals - 02/09/16 0803      PT SHORT TERM GOAL #1   Title be independent in initial HEP  02/13/16   Status Achieved     PT SHORT TERM GOAL #2   Title perform TUG in < or = to 20 seconds to improve balance   Time 4   Period Weeks   Status On-going     PT SHORT TERM GOAL #3   Title The patient will have improved hip flexor muscle length to 5 degrees and HS length to 55 degrees needed for improved stride length with ambulation   Status Partially Met     PT SHORT TERM GOAL #4   Title BERG balance test improved from 29/56 to 33/56 indicating improved safety for standing ADLs like grooming  and dresssing   Time 4   Period Weeks   Status On-going           PT Long Term Goals - 02/09/16 1275      PT LONG TERM GOAL #1   Title be independent in advanced HEP for further improvements in strength, ROM and balance  03/12/16   Time 8   Period Weeks   Status On-going     PT LONG TERM GOAL #2   Title reduce FOTO to < or = to 46% limitation indicating improved function with ADLs   Time 8   Period Weeks   Status On-going     PT LONG TERM GOAL #3   Title perform TUG in < or = to 18 seconds to reduce risk of falls   Time 8   Period Weeks   Status On-going     PT LONG TERM GOAL #4   Title Improved hip and knee strength to at least 3+/5 for greater ease with rising from a chair and  with standing > 10 min    Time 8   Period Weeks   Status On-going     PT LONG TERM GOAL #5   Title The patient will be able to ambulate 400 feet in six minutes needed for shorter distance community ambulation with appropriate assistive device   Time 8   Period Weeks   Status On-going     PT LONG TERM GOAL #6   Title BERG balance score improved to 36/56 indicating improved safety with walking and standing dynamic activities   Time 8   Period Weeks   Status On-going               Plan - 02/09/16 0805    PT Next Visit Plan Do BERG for STG next visit      Patient will benefit from skilled therapeutic intervention in order to improve the following deficits and impairments:     Visit Diagnosis: Muscle weakness (generalized)  Difficulty in walking, not elsewhere classified  Repeated falls  Chronic midline low back pain without sciatica     Problem List Patient  Active Problem List   Diagnosis Date Noted  . Leg edema, right 02/20/2015  . BPH (benign prostatic hypertrophy) 12/09/2014  . Chronic radicular low back pain 08/06/2012  . COPD (chronic obstructive pulmonary disease) ([place]) 04/21/2012  . Hyponatremia 03/29/2012  . Localized osteoarthrosis not specified whether  primary or secondary, lower leg 06/20/2010  . VARICOSE VEINS LOWER EXTREMITIES W/INFLAMMATION 02/12/2010  . [place] LOCALIZED OSTEOARTHROSIS INVOLVING HAND 10/16/2009  . DUPUYTREN'S CONTRACTURE, RIGHT 07/04/2009  . DEGENERATIVE DISC DISEASE, LUMBOSACRAL SPINE W/RADICULOPATHY 06/19/2009  . CARCINOMA, BASAL CELL 02/07/2009  . ANEMIA OF CHRONIC DISEASE 02/07/2009  . BURSITIS, RIGHT SHOULDER 10/07/2008  . CATARACT ASSOCIATED WITH OTHER SYNDROMES 08/01/2008  . DRY SKIN 08/01/2008  . CERUMEN IMPACTION, BILATERAL 03/01/2008  . CRAMP IN LIMB 01/18/2008  . DEPRESSION, SITUATIONAL, ACUTE 12/28/2007  . CONSTIPATION, DRUG INDUCED 12/28/2007  . POLYNEUROPATHY OTHER DISEASES CLASSIFIED ELSW 09/24/2007  . WEIGHT LOSS, ABNORMAL 01/07/2007  . Hyperlipidemia 09/22/2006  . Essential hypertension 09/22/2006  . GERD 09/22/2006  . OSTEOARTHRITIS 09/22/2006    Ruben Im C 02/09/2016, 8:43 AM   [facility] [address] [place], [place], [address] Phone: [phone]   Fax:  [phone]  Name: Omarian Jaquith MRN: [id number] Date of Birth: 01-Sep-1931

## 2016-02-09 NOTE — Patient Instructions (Signed)
Cleland Simkins PT [facility] [address], [address] [place],  [address] Phone # [phone] Fax [phone]    

## 2016-02-09 NOTE — Therapy (Signed)
[facility] [address] [place], [place], [address] Phone: [phone]   Fax:  [phone]  Physical Therapy Treatment  Patient Details  Name: Darin [name] MRN: [id number] Date of Birth: [birth year] Referring Provider: Dr. Elease Hashimoto  Encounter Date: 02/09/2016      PT End of Session - 02/09/16 0803    Visit Number 7   Number of Visits 10   Authorization Type Medicare g codes;  KX at visit 15   PT Start Time 0750   PT Stop Time 0844   PT Time Calculation (min) 54 min   Activity Tolerance Patient tolerated treatment well      Past Medical History:  Diagnosis Date  . Arthritis   . GERD (gastroesophageal reflux disease)   . H/O hiatal hernia    "FOR YEARS" " NO PROBLEM"  . Hyperlipidemia   . Hypertension   . Knee pain   . Neuropathy (HCC)    PERIPHERAL   . Osteoporosis   . PMR (polymyalgia rheumatica) (HCC)   . Polymyalgia ([place]) 5 YEARS    Past Surgical History:  Procedure Laterality Date  . CARPAL TUNNEL RELEASE    . CATARACT EXTRACTION     right  . INGUINAL HERNIA REPAIR  10/14/2011   Procedure: HERNIA REPAIR INGUINAL ADULT;  Surgeon: [name], MD;  Location: [facility];  Service: General;  Laterality: Right;  Right Inguinal Hernia Repair with Mesh  . TONSILLECTOMY    . VARICOSE VEIN SURGERY      There were no vitals filed for this visit.      Subjective Assessment - 02/09/16 0756    Subjective My back is a bit better.  My left leg was hurting earlier when I got up but now has subsided.  No recent falls.  I still have to use my arms a lot to get up from the chair.  I usually feel better after my therapy appointments.  Not using an assistive device despite fall risk.     Currently in Pain? Yes   Pain Score 6    Pain Location Leg   Pain Orientation Right   Pain Type Chronic pain            [facility] PT Assessment - 02/09/16 0001      Strength   Right Hip ABduction 3+/5   Left Hip  ABduction 3/5     Flexibility   Soft Tissue Assessment /Muscle Length --  70 on left, 75 on right;  left hip extension 3 degrees, 8     Timed Up and Go Test   Manual TUG (seconds) 21                     [facility] Adult PT Treatment/Exercise - 02/09/16 0001      Lumbar Exercises: Stretches   Active Hamstring Stretch 3 reps;20 seconds  seated     Lumbar Exercises: Seated   Sit to Stand 10 reps  from BOSU with light UE use   Sit to Stand Limitations from high table no arms 15x     Knee/Hip Exercises: Stretches   Other Knee/Hip Stretches hip flexor stretch on 2nd step 10x right/left     Knee/Hip Exercises: Aerobic   Nustep Level 2 x 10 minutes  PT present to discuss progress     Knee/Hip Exercises: Standing   Hip Extension Right;Left;1 set;10 reps   Extension Limitations red band    Other Standing Knee Exercises 1st and  2nd step taps alternating 10x each   Other Standing Knee Exercises psoas doorway 3x 5 with UE movements     Knee/Hip Exercises: Seated   Ball Squeeze 5" hold x 20                PT Education - 02/09/16 0827    Education provided Yes   Education Details seated red band and clams red band   Person(s) Educated Patient   Methods Explanation;Demonstration;Handout   Comprehension Verbalized understanding;Returned demonstration          PT Short Term Goals - 02/09/16 0803      PT SHORT TERM GOAL #1   Title be independent in initial HEP  02/13/16   Status Achieved     PT SHORT TERM GOAL #2   Title perform TUG in < or = to 20 seconds to improve balance   Time 4   Period Weeks   Status On-going     PT SHORT TERM GOAL #3   Title The patient will have improved hip flexor muscle length to 5 degrees and HS length to 55 degrees needed for improved stride length with ambulation   Status Partially Met     PT SHORT TERM GOAL #4   Title BERG balance test improved from 29/56 to 33/56 indicating improved safety for standing ADLs like grooming  and dresssing   Time 4   Period Weeks   Status On-going           PT Long Term Goals - 02/09/16 1959      PT LONG TERM GOAL #1   Title be independent in advanced HEP for further improvements in strength, ROM and balance  03/12/16   Time 8   Period Weeks   Status On-going     PT LONG TERM GOAL #2   Title reduce FOTO to < or = to 46% limitation indicating improved function with ADLs   Time 8   Period Weeks   Status On-going     PT LONG TERM GOAL #3   Title perform TUG in < or = to 18 seconds to reduce risk of falls   Time 8   Period Weeks   Status On-going     PT LONG TERM GOAL #4   Title Improved hip and knee strength to at least 3+/5 for greater ease with rising from a chair and  with standing > 10 min    Time 8   Period Weeks   Status On-going     PT LONG TERM GOAL #5   Title The patient will be able to ambulate 400 feet in six minutes needed for shorter distance community ambulation with appropriate assistive device   Time 8   Period Weeks   Status On-going     PT LONG TERM GOAL #6   Title BERG balance score improved to 36/56 indicating improved safety with walking and standing dynamic activities   Time 8   Period Weeks   Status On-going               Plan - 02/09/16 0805    Clinical Impression Statement The patient continues to have significant gluteal muscle weakness bilaterally making it difficult to rise from sit to stand without significant UE use.    Right knee audible crepitus with sit to stand but no pain.  Much improved HS length but right > left decreased hip flexor lengths contribute to short shuffled steps with ambulation.  Close supervision in standing  for safety secondary to high risk for falls.  Therapist also closely monitoring for pain and excessive fatigue.     PT Next Visit Plan Do BERG for STG check next visit;  hip strengthening; core strengthening;  balance low level       Patient will benefit from skilled therapeutic intervention  in order to improve the following deficits and impairments:     Visit Diagnosis: Muscle weakness (generalized)  Difficulty in walking, not elsewhere classified  Repeated falls  Chronic midline low back pain without sciatica     Problem List Patient Active Problem List   Diagnosis Date Noted  . Leg edema, right 02/20/2015  . BPH (benign prostatic hypertrophy) 12/09/2014  . Chronic radicular low back pain 08/06/2012  . COPD (chronic obstructive pulmonary disease) ([place]) 04/21/2012  . Hyponatremia 03/29/2012  . Localized osteoarthrosis not specified whether primary or secondary, lower leg 06/20/2010  . VARICOSE VEINS LOWER EXTREMITIES W/INFLAMMATION 02/12/2010  . [place] LOCALIZED OSTEOARTHROSIS INVOLVING HAND 10/16/2009  . DUPUYTREN'S CONTRACTURE, RIGHT 07/04/2009  . DEGENERATIVE DISC DISEASE, LUMBOSACRAL SPINE W/RADICULOPATHY 06/19/2009  . CARCINOMA, BASAL CELL 02/07/2009  . ANEMIA OF CHRONIC DISEASE 02/07/2009  . BURSITIS, RIGHT SHOULDER 10/07/2008  . CATARACT ASSOCIATED WITH OTHER SYNDROMES 08/01/2008  . DRY SKIN 08/01/2008  . CERUMEN IMPACTION, BILATERAL 03/01/2008  . CRAMP IN LIMB 01/18/2008  . DEPRESSION, SITUATIONAL, ACUTE 12/28/2007  . CONSTIPATION, DRUG INDUCED 12/28/2007  . POLYNEUROPATHY OTHER DISEASES CLASSIFIED ELSW 09/24/2007  . WEIGHT LOSS, ABNORMAL 01/07/2007  . Hyperlipidemia 09/22/2006  . Essential hypertension 09/22/2006  . GERD 09/22/2006  . OSTEOARTHRITIS 09/22/2006   Ruben Im, PT 02/09/16 11:28 AM Phone: [phone] Fax: [phone] Alvera Singh 02/09/2016, 11:27 AM  [facility] [address] [place], [place], [address] Phone: [phone]   Fax:  [phone]  Name: Darin [name] MRN: [id number] Date of Birth: [birth year]

## 2016-02-13 ENCOUNTER — Encounter: Payer: Self-pay | Admitting: Physical Therapy

## 2016-02-13 ENCOUNTER — Ambulatory Visit: Payer: Medicare Other | Admitting: Physical Therapy

## 2016-02-13 DIAGNOSIS — R296 Repeated falls: Secondary | ICD-10-CM | POA: Diagnosis not present

## 2016-02-13 DIAGNOSIS — M545 Low back pain: Secondary | ICD-10-CM | POA: Diagnosis not present

## 2016-02-13 DIAGNOSIS — R262 Difficulty in walking, not elsewhere classified: Secondary | ICD-10-CM | POA: Diagnosis not present

## 2016-02-13 DIAGNOSIS — G8929 Other chronic pain: Secondary | ICD-10-CM

## 2016-02-13 DIAGNOSIS — M6281 Muscle weakness (generalized): Secondary | ICD-10-CM | POA: Diagnosis not present

## 2016-02-13 NOTE — Therapy (Signed)
[facility] [address], [place], [place], [address] Phone: [phone]   Fax:  [phone]  Physical Therapy Treatment  Patient Details  Name: Darin [name] MRN: [id number] Date of Birth: 1932/03/18 Referring Provider: Dr. Elease Hashimoto  Encounter Date: 02/13/2016      PT End of Session - 02/13/16 0807    Visit Number 8   Number of Visits 10   Date for PT Re-Evaluation 03/12/16   Authorization Type Medicare g codes;  KX at visit 15   PT Start Time 0803   PT Stop Time 0846   PT Time Calculation (min) 43 min   Activity Tolerance Patient tolerated treatment well   Behavior During Therapy [facility] for tasks assessed/performed      Past Medical History:  Diagnosis Date  . Arthritis   . GERD (gastroesophageal reflux disease)   . H/O hiatal hernia    "FOR YEARS" " NO PROBLEM"  . Hyperlipidemia   . Hypertension   . Knee pain   . Neuropathy (HCC)    PERIPHERAL   . Osteoporosis   . PMR (polymyalgia rheumatica) (HCC)   . Polymyalgia ([place]) 5 YEARS    Past Surgical History:  Procedure Laterality Date  . CARPAL TUNNEL RELEASE    . CATARACT EXTRACTION     right  . INGUINAL HERNIA REPAIR  10/14/2011   Procedure: HERNIA REPAIR INGUINAL ADULT;  Surgeon: [name], MD;  Location: [place];  Service: General;  Laterality: Right;  Right Inguinal Hernia Repair with Mesh  . TONSILLECTOMY    . VARICOSE VEIN SURGERY      There were no vitals filed for this visit.      Subjective Assessment - 02/13/16 0805    Subjective Back feeling pretty good, left calf gives trouble upon getting up in the mornings. States ankles always hurt "that does go away".   Pertinent History chronic lumbar DDD, polyneuropathy;  HTN   Limitations Walking;House hold activities;Standing   How long can you sit comfortably? as long as  I want but trouble rising following    How long can you stand comfortably? 5 min   How long can you walk  comfortably? 10 min   Diagnostic tests none recently   Patient Stated Goals walk normally;  maintain my balance   Currently in Pain? Yes   Pain Score 7    Pain Location Leg   Pain Orientation Left   Pain Radiating Towards Left calf   Pain Onset More than a month ago   Aggravating Factors  morning, with walking/ standing, afer exercise   Pain Relieving Factors moveing around   Multiple Pain Sites No            [facility] PT Assessment - 02/13/16 0001      Berg Balance Test   Sit to Stand Able to stand  independently using hands   Standing Unsupported Able to stand safely 2 minutes   Sitting with Back Unsupported but Feet Supported on Floor or Stool Able to sit safely and securely 2 minutes   Stand to Sit Controls descent by using hands   Transfers Able to transfer safely, definite need of hands   Standing Unsupported with Eyes Closed Able to stand 10 seconds safely   Standing Ubsupported with Feet Together Needs help to attain position and unable to hold for 15 seconds   From Standing, Reach Forward with Outstretched Arm Can reach forward >5 cm safely (2")   From Standing Position,  Pick up Object from Floor Unable to try/needs assist to keep balance   From Standing Position, Turn to Look Behind Over each Shoulder Turn sideways only but maintains balance   Turn 360 Degrees Able to turn 360 degrees safely but slowly   Standing Unsupported, Alternately Place Feet on Step/Stool Able to stand independently and complete 8 steps >20 seconds   Standing Unsupported, One Foot in Front Able to plae foot ahead of the other independently and hold 30 seconds   Standing on One Leg Unable to try or needs assist to prevent fall   Total Score 33                     [facility] Adult PT Treatment/Exercise - 02/13/16 0001      Lumbar Exercises: Stretches   Active Hamstring Stretch 3 reps;20 seconds  seated     Lumbar Exercises: Standing   Row Strengthening;Both;20 reps;Theraband   Shoulder  Extension Strengthening;Both;20 reps;Theraband     Lumbar Exercises: Seated   Sit to Stand 20 reps   Sit to Stand Limitations from high table no arms 15x     Knee/Hip Exercises: Stretches   Gastroc Stretch Both;10 seconds     Knee/Hip Exercises: Aerobic   Nustep Level 2 x 10 minutes  PT present to discuss progress     Knee/Hip Exercises: Standing   Hip Abduction Stengthening;Both;2 sets;10 reps  1# added   Abduction Limitations red band   Hip Extension Right;Left;1 set;10 reps   Extension Limitations red band   Rebounder Marching, static standing balance 1 hand, tandem stance balnce 2 hands  x1 minute each   Other Standing Knee Exercises 1st and 2nd step taps alternating 10x each     Knee/Hip Exercises: Seated   Ball Squeeze 5" hold x 20   Abduction/Adduction  Strengthening;Both;20 reps  Red tband                  PT Short Term Goals - 02/13/16 0808      PT SHORT TERM GOAL #1   Title be independent in initial HEP  02/13/16   Time 4   Period Weeks   Status Achieved     PT SHORT TERM GOAL #2   Title perform TUG in < or = to 20 seconds to improve balance   Time 4   Period Weeks   Status On-going     PT SHORT TERM GOAL #3   Title The patient will have improved hip flexor muscle length to 5 degrees and HS length to 55 degrees needed for improved stride length with ambulation   Time 4   Period Weeks   Status Partially Met     PT SHORT TERM GOAL #4   Title BERG balance test improved from 29/56 to 33/56 indicating improved safety for standing ADLs like grooming and dresssing   Baseline 33   Time 4   Period Weeks   Status Achieved           PT Long Term Goals - 02/13/16 6269      PT LONG TERM GOAL #1   Title be independent in advanced HEP for further improvements in strength, ROM and balance  03/12/16   Time 8   Period Weeks   Status On-going     PT LONG TERM GOAL #2   Title reduce FOTO to < or = to 46% limitation indicating improved function  with ADLs   Time 8   Period Weeks  Status On-going     PT LONG TERM GOAL #3   Title perform TUG in < or = to 18 seconds to reduce risk of falls   Time 8   Period Weeks   Status On-going     PT LONG TERM GOAL #4   Title Improved hip and knee strength to at least 3+/5 for greater ease with rising from a chair and  with standing > 10 min    Time 8   Period Weeks   Status On-going     PT LONG TERM GOAL #5   Title The patient will be able to ambulate 400 feet in six minutes needed for shorter distance community ambulation with appropriate assistive device   Time 8   Period Weeks   Status On-going     PT LONG TERM GOAL #6   Title BERG balance score improved to 36/56 indicating improved safety with walking and standing dynamic activities   Time 8   Period Weeks   Status On-going               Plan - 02/13/16 0837    Clinical Impression Statement Pt reports back feeling good but having increase Lt LE pain today. Pt able to tolerate all strengthening exercises well. BERG balance score 33. Pt will conitnue to benefit from skilled therapy for LE and core strengthening and stability for balance and safe ambulation.    Rehab Potential Good   PT Frequency 2x / week   PT Duration 8 weeks   PT Treatment/Interventions ADLs/Self Care Home Management;Moist Heat;Electrical Stimulation;Therapeutic exercise;Neuromuscular re-education;Patient/family education;Manual techniques   PT Next Visit Plan hip strengthening; core strengthening;  balance low level       Patient will benefit from skilled therapeutic intervention in order to improve the following deficits and impairments:  Pain, Abnormal gait, Decreased balance, Decreased activity tolerance, Decreased range of motion, Decreased strength, Decreased mobility, Impaired flexibility, Difficulty walking  Visit Diagnosis: Muscle weakness (generalized)  Difficulty in walking, not elsewhere classified  Repeated falls  Chronic midline  low back pain without sciatica     Problem List Patient Active Problem List   Diagnosis Date Noted  . Leg edema, right 02/20/2015  . BPH (benign prostatic hypertrophy) 12/09/2014  . Chronic radicular low back pain 08/06/2012  . COPD (chronic obstructive pulmonary disease) ([place]) 04/21/2012  . Hyponatremia 03/29/2012  . Localized osteoarthrosis not specified whether primary or secondary, lower leg 06/20/2010  . VARICOSE VEINS LOWER EXTREMITIES W/INFLAMMATION 02/12/2010  . [place] LOCALIZED OSTEOARTHROSIS INVOLVING HAND 10/16/2009  . DUPUYTREN'S CONTRACTURE, RIGHT 07/04/2009  . DEGENERATIVE DISC DISEASE, LUMBOSACRAL SPINE W/RADICULOPATHY 06/19/2009  . CARCINOMA, BASAL CELL 02/07/2009  . ANEMIA OF CHRONIC DISEASE 02/07/2009  . BURSITIS, RIGHT SHOULDER 10/07/2008  . CATARACT ASSOCIATED WITH OTHER SYNDROMES 08/01/2008  . DRY SKIN 08/01/2008  . CERUMEN IMPACTION, BILATERAL 03/01/2008  . CRAMP IN LIMB 01/18/2008  . DEPRESSION, SITUATIONAL, ACUTE 12/28/2007  . CONSTIPATION, DRUG INDUCED 12/28/2007  . POLYNEUROPATHY OTHER DISEASES CLASSIFIED ELSW 09/24/2007  . WEIGHT LOSS, ABNORMAL 01/07/2007  . Hyperlipidemia 09/22/2006  . Essential hypertension 09/22/2006  . GERD 09/22/2006  . OSTEOARTHRITIS 09/22/2006    Mikle Bosworth PTA 02/13/2016, 9:43 AM  [facility] [address] [place], [place], [address] Phone: [phone]   Fax:  [phone]  Name: Charly Holcomb MRN: [id number] Date of Birth: Apr 22, 1931

## 2016-02-20 ENCOUNTER — Encounter: Payer: Self-pay | Admitting: Physical Therapy

## 2016-02-20 ENCOUNTER — Ambulatory Visit: Payer: Medicare Other | Admitting: Physical Therapy

## 2016-02-20 DIAGNOSIS — R296 Repeated falls: Secondary | ICD-10-CM | POA: Diagnosis not present

## 2016-02-20 DIAGNOSIS — M6281 Muscle weakness (generalized): Secondary | ICD-10-CM | POA: Diagnosis not present

## 2016-02-20 DIAGNOSIS — M545 Low back pain: Secondary | ICD-10-CM | POA: Diagnosis not present

## 2016-02-20 DIAGNOSIS — G8929 Other chronic pain: Secondary | ICD-10-CM | POA: Diagnosis not present

## 2016-02-20 DIAGNOSIS — R262 Difficulty in walking, not elsewhere classified: Secondary | ICD-10-CM

## 2016-02-20 NOTE — Therapy (Signed)
[facility] [address], [place], [place], [address] Phone: [phone]   Fax:  [phone]  Physical Therapy Treatment  Patient Details  Name: Darin [name] MRN: [id number] Date of Birth: Jun 17, 1931 Referring Provider: Dr. Elease Hashimoto  Encounter Date: 02/20/2016      PT End of Session - 02/20/16 0801    Visit Number 9   Number of Visits 10   Date for PT Re-Evaluation 03/12/16   Authorization Type Medicare g codes;  KX at visit 15   PT Start Time 0758   PT Stop Time 0841   PT Time Calculation (min) 43 min   Activity Tolerance Patient tolerated treatment well   Behavior During Therapy [facility] for tasks assessed/performed      Past Medical History:  Diagnosis Date  . Arthritis   . GERD (gastroesophageal reflux disease)   . H/O hiatal hernia    "FOR YEARS" " NO PROBLEM"  . Hyperlipidemia   . Hypertension   . Knee pain   . Neuropathy (HCC)    PERIPHERAL   . Osteoporosis   . PMR (polymyalgia rheumatica) (HCC)   . Polymyalgia ([place]) 5 YEARS    Past Surgical History:  Procedure Laterality Date  . CARPAL TUNNEL RELEASE    . CATARACT EXTRACTION     right  . INGUINAL HERNIA REPAIR  10/14/2011   Procedure: HERNIA REPAIR INGUINAL ADULT;  Surgeon: [name], MD;  Location: [place];  Service: General;  Laterality: Right;  Right Inguinal Hernia Repair with Mesh  . TONSILLECTOMY    . VARICOSE VEIN SURGERY      There were no vitals filed for this visit.      Subjective Assessment - 02/20/16 0843    Subjective Feeling pretty good today, did some exercises over the weekend.   Pertinent History chronic lumbar DDD, polyneuropathy;  HTN   Limitations Walking;House hold activities;Standing   How long can you sit comfortably? as long as  I want but trouble rising following    Currently in Pain? Yes   Pain Score 4    Pain Location Leg   Pain Orientation Left   Pain Descriptors / Indicators Sore   Pain Type  Chronic pain   Pain Onset More than a month ago   Pain Frequency Constant            [facility] PT Assessment - 02/20/16 0001      Timed Up and Go Test   TUG Normal TUG   Normal TUG (seconds) 23                     [facility] Adult PT Treatment/Exercise - 02/20/16 0001      Lumbar Exercises: Stretches   Active Hamstring Stretch 3 reps;20 seconds  seated     Lumbar Exercises: Machines for Strengthening   Leg Press Seat 8 #30 2x10  Difficulty keeping feet up     Lumbar Exercises: Standing   Row Strengthening;Both;20 reps  #20   Shoulder Extension Strengthening;Both;20 reps  #20 tactile cues for form     Lumbar Exercises: Seated   Sit to Stand 20 reps   Sit to Stand Limitations from high table no arms 15x     Lumbar Exercises: Supine   Bent Knee Raise 20 reps   Bridge 20 reps     Knee/Hip Exercises: Stretches   Gastroc Stretch Both;10 seconds     Knee/Hip Exercises: Aerobic   Nustep Level 2 x 10 minutes  PT present to discuss progress     Knee/Hip Exercises: Standing   Hip Abduction --   Hip Extension --   Rebounder Marching, static standing balance 1 hand, tandem stance balnce 2 hands  x1 minute each     Knee/Hip Exercises: Seated   Abduction/Adduction  Strengthening;Both;20 reps  Red tband                  PT Short Term Goals - 02/20/16 0759      PT SHORT TERM GOAL #2   Title perform TUG in < or = to 20 seconds to improve balance   Time 4   Period Weeks   Status On-going     PT SHORT TERM GOAL #3   Title The patient will have improved hip flexor muscle length to 5 degrees and HS length to 55 degrees needed for improved stride length with ambulation   Time 4   Period Weeks   Status Partially Met     PT SHORT TERM GOAL #4   Title BERG balance test improved from 29/56 to 33/56 indicating improved safety for standing ADLs like grooming and dresssing   Baseline 33   Time 4   Period Weeks   Status Achieved           PT Long  Term Goals - 02/20/16 0800      PT LONG TERM GOAL #2   Title reduce FOTO to < or = to 46% limitation indicating improved function with ADLs   Time 8   Period Weeks   Status On-going     PT LONG TERM GOAL #4   Title Improved hip and knee strength to at least 3+/5 for greater ease with rising from a chair and  with standing > 10 min    Time 8   Period Weeks   Status On-going     PT LONG TERM GOAL #5   Title The patient will be able to ambulate 400 feet in six minutes needed for shorter distance community ambulation with appropriate assistive device   Time 8   Period Weeks   Status On-going     PT LONG TERM GOAL #6   Title BERG balance score improved to 36/56 indicating improved safety with walking and standing dynamic activities   Time 8   Period Weeks   Status On-going               Plan - 02/20/16 0839    Clinical Impression Statement Pt comliant with home exercises. Pt continues to have difficulty with Lt hip flexion and Bil LE weakness. TUG score 23 seconds today. Pt will continue to benefit from skilled therapy for LE strength and core stability for balance.    Rehab Potential Good   PT Frequency 2x / week   PT Duration 8 weeks   PT Treatment/Interventions ADLs/Self Care Home Management;Moist Heat;Electrical Stimulation;Therapeutic exercise;Neuromuscular re-education;Patient/family education;Manual techniques   PT Next Visit Plan hip strengthening; core strengthening;  balance low level    Consulted and Agree with Plan of Care Patient      Patient will benefit from skilled therapeutic intervention in order to improve the following deficits and impairments:  Pain, Abnormal gait, Decreased balance, Decreased activity tolerance, Decreased range of motion, Decreased strength, Decreased mobility, Impaired flexibility, Difficulty walking  Visit Diagnosis: Muscle weakness (generalized)  Difficulty in walking, not elsewhere classified     Problem List Patient  Active Problem List   Diagnosis Date Noted  . Leg edema,  right 02/20/2015  . BPH (benign prostatic hypertrophy) 12/09/2014  . Chronic radicular low back pain 08/06/2012  . COPD (chronic obstructive pulmonary disease) ([place]) 04/21/2012  . Hyponatremia 03/29/2012  . Localized osteoarthrosis not specified whether primary or secondary, lower leg 06/20/2010  . VARICOSE VEINS LOWER EXTREMITIES W/INFLAMMATION 02/12/2010  . [place] LOCALIZED OSTEOARTHROSIS INVOLVING HAND 10/16/2009  . DUPUYTREN'S CONTRACTURE, RIGHT 07/04/2009  . DEGENERATIVE DISC DISEASE, LUMBOSACRAL SPINE W/RADICULOPATHY 06/19/2009  . CARCINOMA, BASAL CELL 02/07/2009  . ANEMIA OF CHRONIC DISEASE 02/07/2009  . BURSITIS, RIGHT SHOULDER 10/07/2008  . CATARACT ASSOCIATED WITH OTHER SYNDROMES 08/01/2008  . DRY SKIN 08/01/2008  . CERUMEN IMPACTION, BILATERAL 03/01/2008  . CRAMP IN LIMB 01/18/2008  . DEPRESSION, SITUATIONAL, ACUTE 12/28/2007  . CONSTIPATION, DRUG INDUCED 12/28/2007  . POLYNEUROPATHY OTHER DISEASES CLASSIFIED ELSW 09/24/2007  . WEIGHT LOSS, ABNORMAL 01/07/2007  . Hyperlipidemia 09/22/2006  . Essential hypertension 09/22/2006  . GERD 09/22/2006  . OSTEOARTHRITIS 09/22/2006    Mikle Bosworth PTA 02/20/2016, 8:45 AM  [facility] [address], [place], [place], [address] Phone: [phone]   Fax:  [phone]  Name: Darin [name] MRN: [id number] Date of Birth: [birth year]

## 2016-02-23 ENCOUNTER — Encounter: Payer: Self-pay | Admitting: Physical Therapy

## 2016-02-23 ENCOUNTER — Ambulatory Visit: Payer: Medicare Other | Attending: Family Medicine | Admitting: Physical Therapy

## 2016-02-23 DIAGNOSIS — M545 Low back pain: Secondary | ICD-10-CM | POA: Insufficient documentation

## 2016-02-23 DIAGNOSIS — M6281 Muscle weakness (generalized): Secondary | ICD-10-CM | POA: Insufficient documentation

## 2016-02-23 DIAGNOSIS — G8929 Other chronic pain: Secondary | ICD-10-CM | POA: Diagnosis not present

## 2016-02-23 DIAGNOSIS — R262 Difficulty in walking, not elsewhere classified: Secondary | ICD-10-CM | POA: Insufficient documentation

## 2016-02-23 DIAGNOSIS — M25561 Pain in right knee: Secondary | ICD-10-CM | POA: Insufficient documentation

## 2016-02-23 DIAGNOSIS — R296 Repeated falls: Secondary | ICD-10-CM | POA: Insufficient documentation

## 2016-02-23 NOTE — Therapy (Addendum)
[facility] [address], [place], [place], [address] Phone: [phone]   Fax:  [phone]  Physical Therapy Treatment  Patient Details  Name: Darin [name] MRN: [id number] Date of Birth: 1932-03-09 Referring Provider: Dr. Elease Hashimoto  Encounter Date: 02/23/2016      PT End of Session - 02/23/16 0802    Visit Number 10   Number of Visits 10   Date for PT Re-Evaluation 03/12/16   Authorization Type Medicare g codes;  KX at visit 15   PT Start Time 0758   PT Stop Time 0839   PT Time Calculation (min) 41 min   Activity Tolerance Patient tolerated treatment well   Behavior During Therapy [facility] for tasks assessed/performed      Past Medical History:  Diagnosis Date  . Arthritis   . GERD (gastroesophageal reflux disease)   . H/O hiatal hernia    "FOR YEARS" " NO PROBLEM"  . Hyperlipidemia   . Hypertension   . Knee pain   . Neuropathy (HCC)    PERIPHERAL   . Osteoporosis   . PMR (polymyalgia rheumatica) (HCC)   . Polymyalgia ([place]) 5 YEARS    Past Surgical History:  Procedure Laterality Date  . CARPAL TUNNEL RELEASE    . CATARACT EXTRACTION     right  . INGUINAL HERNIA REPAIR  10/14/2011   Procedure: HERNIA REPAIR INGUINAL ADULT;  Surgeon: [name], MD;  Location: [place];  Service: General;  Laterality: Right;  Right Inguinal Hernia Repair with Mesh  . TONSILLECTOMY    . VARICOSE VEIN SURGERY      There were no vitals filed for this visit.      Subjective Assessment - 02/23/16 0758    Subjective Legs feleing the same. Had some cramping in Lt leg over night but didn't get too bad.   Pertinent History chronic lumbar DDD, polyneuropathy;  HTN   Limitations Walking;House hold activities;Standing   How long can you sit comfortably? as long as  I want but trouble rising following    How long can you stand comfortably? 5 min   How long can you walk comfortably? 10 min   Diagnostic tests none  recently   Patient Stated Goals walk normally;  maintain my balance   Currently in Pain? Yes   Pain Score 4    Pain Location Leg   Pain Orientation Left   Pain Descriptors / Indicators Sore   Pain Type Chronic pain   Pain Radiating Towards Left calf   Pain Onset More than a month ago   Pain Frequency Constant   Aggravating Factors  Morning with walking/ standing after exercise   Multiple Pain Sites No                         [facility] Adult PT Treatment/Exercise - 02/23/16 0001      Lumbar Exercises: Machines for Strengthening   Leg Press Seat 8 #60 2x10  VC to not extend knees all the way     Lumbar Exercises: Seated   Sit to Stand --  3x10   Sit to Stand Limitations from high table no arms 15x     Lumbar Exercises: Supine   Bent Knee Raise 20 reps   Bridge 20 reps     Knee/Hip Exercises: Standing   Hip Abduction Stengthening;Both;2 sets;10 reps   Hip Extension Right;Left;1 set;10 reps   Rebounder Marching, static standing balance 1 hand, tandem stance  balnce 2 hands  x1 minute each     Knee/Hip Exercises: Seated   Abduction/Adduction  Strengthening;Both;20 reps  Red tband     Knee/Hip Exercises: Supine   Quad Sets Strengthening;Right;2 sets;10 reps   Terminal Knee Extension Strengthening;Both;2 sets;10 reps  #3 Tactile cues to keep knee on bolster   Straight Leg Raises Strengthening;Both;10 reps;1 set                  PT Short Term Goals - 02/20/16 0759      PT SHORT TERM GOAL #2   Title perform TUG in < or = to 20 seconds to improve balance   Time 4   Period Weeks   Status On-going     PT SHORT TERM GOAL #3   Title The patient will have improved hip flexor muscle length to 5 degrees and HS length to 55 degrees needed for improved stride length with ambulation   Time 4   Period Weeks   Status Partially Met     PT SHORT TERM GOAL #4   Title BERG balance test improved from 29/56 to 33/56 indicating improved safety for standing ADLs  like grooming and dresssing   Baseline 33   Time 4   Period Weeks   Status Achieved           PT Long Term Goals - 02/20/16 0800      PT LONG TERM GOAL #2   Title reduce FOTO to < or = to 46% limitation indicating improved function with ADLs   Time 8   Period Weeks   Status On-going     PT LONG TERM GOAL #4   Title Improved hip and knee strength to at least 3+/5 for greater ease with rising from a chair and  with standing > 10 min    Time 8   Period Weeks   Status On-going     PT LONG TERM GOAL #5   Title The patient will be able to ambulate 400 feet in six minutes needed for shorter distance community ambulation with appropriate assistive device   Time 8   Period Weeks   Status On-going     PT LONG TERM GOAL #6   Title BERG balance score improved to 36/56 indicating improved safety with walking and standing dynamic activities   Time 8   Period Weeks   Status On-going               Plan - 02/23/16 4742    Clinical Impression Statement Pt continues to have Lt knee pain with audible popping with knee exetension. Pt able to perform much better on leg press and did wel with sit to stands from high table. Pt will continue to benefit from skilled therapy for Bil LE strengthening and knee stability.    Rehab Potential Good   PT Frequency 2x / week   PT Duration 8 weeks   PT Next Visit Plan hip and knee strengthening; core strengthening;  balance low level    Consulted and Agree with Plan of Care Patient      Patient will benefit from skilled therapeutic intervention in order to improve the following deficits and impairments:  Pain, Abnormal gait, Decreased balance, Decreased activity tolerance, Decreased range of motion, Decreased strength, Decreased mobility, Impaired flexibility, Difficulty walking  Visit Diagnosis: Muscle weakness (generalized)  Difficulty in walking, not elsewhere classified  Repeated falls    TUG and BERG performed in recent visits  G  code:  Mobility and moving around   Current CL;  Goal CK    Problem List Patient Active Problem List   Diagnosis Date Noted  . Leg edema, right 02/20/2015  . BPH (benign prostatic hypertrophy) 12/09/2014  . Chronic radicular low back pain 08/06/2012  . COPD (chronic obstructive pulmonary disease) ([place]) 04/21/2012  . Hyponatremia 03/29/2012  . Localized osteoarthrosis not specified whether primary or secondary, lower leg 06/20/2010  . VARICOSE VEINS LOWER EXTREMITIES W/INFLAMMATION 02/12/2010  . [place] LOCALIZED OSTEOARTHROSIS INVOLVING HAND 10/16/2009  . DUPUYTREN'S CONTRACTURE, RIGHT 07/04/2009  . DEGENERATIVE DISC DISEASE, LUMBOSACRAL SPINE W/RADICULOPATHY 06/19/2009  . CARCINOMA, BASAL CELL 02/07/2009  . ANEMIA OF CHRONIC DISEASE 02/07/2009  . BURSITIS, RIGHT SHOULDER 10/07/2008  . CATARACT ASSOCIATED WITH OTHER SYNDROMES 08/01/2008  . DRY SKIN 08/01/2008  . CERUMEN IMPACTION, BILATERAL 03/01/2008  . CRAMP IN LIMB 01/18/2008  . DEPRESSION, SITUATIONAL, ACUTE 12/28/2007  . CONSTIPATION, DRUG INDUCED 12/28/2007  . POLYNEUROPATHY OTHER DISEASES CLASSIFIED ELSW 09/24/2007  . WEIGHT LOSS, ABNORMAL 01/07/2007  . Hyperlipidemia 09/22/2006  . Essential hypertension 09/22/2006  . GERD 09/22/2006  . OSTEOARTHRITIS 09/22/2006   Ruben Im, PT 02/23/16 11:55 AM Phone: [phone] Fax: [phone]  Mikle Bosworth, PTA 02/23/16 11:50 AM   [facility] [address] [place], [place], [address] Phone: [phone]   Fax:  [phone]  Name: Ashvin Adelson MRN: [id number] Date of Birth: 23-Jan-1932

## 2016-02-27 ENCOUNTER — Encounter: Payer: Self-pay | Admitting: Physical Therapy

## 2016-02-27 ENCOUNTER — Ambulatory Visit: Payer: Medicare Other | Admitting: Physical Therapy

## 2016-02-27 DIAGNOSIS — R262 Difficulty in walking, not elsewhere classified: Secondary | ICD-10-CM | POA: Diagnosis not present

## 2016-02-27 DIAGNOSIS — R296 Repeated falls: Secondary | ICD-10-CM | POA: Diagnosis not present

## 2016-02-27 DIAGNOSIS — M25561 Pain in right knee: Secondary | ICD-10-CM | POA: Diagnosis not present

## 2016-02-27 DIAGNOSIS — G8929 Other chronic pain: Secondary | ICD-10-CM | POA: Diagnosis not present

## 2016-02-27 DIAGNOSIS — M6281 Muscle weakness (generalized): Secondary | ICD-10-CM

## 2016-02-27 DIAGNOSIS — M545 Low back pain: Secondary | ICD-10-CM | POA: Diagnosis not present

## 2016-02-27 NOTE — Patient Instructions (Signed)
Achilles / Soleus, Standing    Stand, right foot behind, heel on floor and turned slightly out. Lower hips and bend knees. Hold _10__ seconds. Repeat 2___ times per session. Do _2__ sessions per day.  Copyright  VHI. All rights reserved.   Achilles / Gastroc, Standing    Stand, right foot behind, heel on floor and turned slightly out, leg straight, forward leg bent. Move hips forward. Hold __10_ seconds. Repeat _2__ times per session. Do _2__ sessions per day.  Copyright  VHI. All rights reserved.   Mikle Bosworth, PTA 02/27/16 8:39 AM  [facility] [address] [place], [place] [address] Phone # [phone] Fax [phone]

## 2016-02-27 NOTE — Therapy (Signed)
[facility] [address] [place], [place], [address] Phone: [phone]   Fax:  [phone]  Physical Therapy Treatment  Patient Details  Name: Darin [name] MRN: [id number] Date of Birth: 1931/10/18 Referring Provider: Dr. Elease Hashimoto  Encounter Date: 02/27/2016      PT End of Session - 02/27/16 0807    Visit Number 11   Number of Visits 20   Date for PT Re-Evaluation 03/12/16   Authorization Type Medicare g codes;  KX at visit 15   PT Start Time 0803   PT Stop Time 0845   PT Time Calculation (min) 42 min   Activity Tolerance Patient tolerated treatment well   Behavior During Therapy [facility] for tasks assessed/performed      Past Medical History:  Diagnosis Date  . Arthritis   . GERD (gastroesophageal reflux disease)   . H/O hiatal hernia    "FOR YEARS" " NO PROBLEM"  . Hyperlipidemia   . Hypertension   . Knee pain   . Neuropathy (HCC)    PERIPHERAL   . Osteoporosis   . PMR (polymyalgia rheumatica) (HCC)   . Polymyalgia ([place]) 5 YEARS    Past Surgical History:  Procedure Laterality Date  . CARPAL TUNNEL RELEASE    . CATARACT EXTRACTION     right  . INGUINAL HERNIA REPAIR  10/14/2011   Procedure: HERNIA REPAIR INGUINAL ADULT;  Surgeon: [name], MD;  Location: [place];  Service: General;  Laterality: Right;  Right Inguinal Hernia Repair with Mesh  . TONSILLECTOMY    . VARICOSE VEIN SURGERY      There were no vitals filed for this visit.      Subjective Assessment - 02/27/16 0805    Subjective Pt reports being "creeky as usual". Pt reports having some Lt lower leg pain described as tightness.   Pertinent History chronic lumbar DDD, polyneuropathy;  HTN   Limitations Walking;House hold activities;Standing   How long can you sit comfortably? as long as  I want but trouble rising following    How long can you stand comfortably? 5 min   How long can you walk comfortably? 10 min   Diagnostic  tests none recently   Patient Stated Goals walk normally;  maintain my balance   Currently in Pain? Yes   Pain Score 5    Pain Location Leg   Pain Orientation Left   Pain Descriptors / Indicators Sore   Pain Type Chronic pain   Pain Radiating Towards Left legs   Pain Onset More than a month ago   Pain Frequency Constant                         [facility] Adult PT Treatment/Exercise - 02/27/16 0001      Lumbar Exercises: Stretches   Active Hamstring Stretch 3 reps;20 seconds  seated     Lumbar Exercises: Machines for Strengthening   Leg Press Seat 8 #60 2x10  VC to not extend knees all the way     Lumbar Exercises: Seated   Sit to Stand --  3x10   Sit to Stand Limitations from high table no arms 15x     Knee/Hip Exercises: Stretches   Press photographer Both;10 seconds  with neck extension   Soleus Stretch Both;2 reps;10 seconds  with neck extension     Knee/Hip Exercises: Aerobic   Nustep Level 2 x 10 minutes  PT present to discuss progress  Knee/Hip Exercises: Standing   Hip Extension Stengthening;Both;2 sets;10 reps  With alt arm extension   Forward Step Up Both;20 reps   Rebounder Marching, static standing balance 1 hand, tandem stance balnce 2 hands  x1 minute each                PT Education - 02/27/16 0839    Education provided Yes   Education Details calf and soleous stretching   Methods Explanation;Demonstration;Handout   Comprehension Verbalized understanding          PT Short Term Goals - 02/27/16 0807      PT SHORT TERM GOAL #3   Title The patient will have improved hip flexor muscle length to 5 degrees and HS length to 55 degrees needed for improved stride length with ambulation   Time 4   Period Weeks   Status Partially Met     PT SHORT TERM GOAL #4   Title BERG balance test improved from 29/56 to 33/56 indicating improved safety for standing ADLs like grooming and dresssing   Baseline 33   Time 4   Period Weeks    Status Achieved           PT Long Term Goals - 02/27/16 3382      PT LONG TERM GOAL #2   Title reduce FOTO to < or = to 46% limitation indicating improved function with ADLs   Time 8   Period Weeks   Status On-going     PT LONG TERM GOAL #3   Title perform TUG in < or = to 18 seconds to reduce risk of falls   Time 8   Period Weeks   Status On-going               Plan - 02/27/16 5053    Clinical Impression Statement Pt having some muscle tightness in Bil calves and hamstrings. Pt continues to have very forward flexed posture but is improving with LE strength. Pt will continue to benefit from skilled therapy for Bil LE strengthening and flexibility.    Rehab Potential Good   PT Frequency 2x / week   PT Duration 8 weeks   PT Treatment/Interventions ADLs/Self Care Home Management;Moist Heat;Electrical Stimulation;Therapeutic exercise;Neuromuscular re-education;Patient/family education;Manual techniques   PT Next Visit Plan Extensor stretching, posture and LE strength   Consulted and Agree with Plan of Care Patient      Patient will benefit from skilled therapeutic intervention in order to improve the following deficits and impairments:  Pain, Abnormal gait, Decreased balance, Decreased activity tolerance, Decreased range of motion, Decreased strength, Decreased mobility, Impaired flexibility, Difficulty walking  Visit Diagnosis: Muscle weakness (generalized)  Difficulty in walking, not elsewhere classified  Repeated falls     Problem List Patient Active Problem List   Diagnosis Date Noted  . Leg edema, right 02/20/2015  . BPH (benign prostatic hypertrophy) 12/09/2014  . Chronic radicular low back pain 08/06/2012  . COPD (chronic obstructive pulmonary disease) ([place]) 04/21/2012  . Hyponatremia 03/29/2012  . Localized osteoarthrosis not specified whether primary or secondary, lower leg 06/20/2010  . VARICOSE VEINS LOWER EXTREMITIES W/INFLAMMATION 02/12/2010  .  [place] LOCALIZED OSTEOARTHROSIS INVOLVING HAND 10/16/2009  . DUPUYTREN'S CONTRACTURE, RIGHT 07/04/2009  . DEGENERATIVE DISC DISEASE, LUMBOSACRAL SPINE W/RADICULOPATHY 06/19/2009  . CARCINOMA, BASAL CELL 02/07/2009  . ANEMIA OF CHRONIC DISEASE 02/07/2009  . BURSITIS, RIGHT SHOULDER 10/07/2008  . CATARACT ASSOCIATED WITH OTHER SYNDROMES 08/01/2008  . DRY SKIN 08/01/2008  . CERUMEN IMPACTION, BILATERAL 03/01/2008  .  CRAMP IN LIMB 01/18/2008  . DEPRESSION, SITUATIONAL, ACUTE 12/28/2007  . CONSTIPATION, DRUG INDUCED 12/28/2007  . POLYNEUROPATHY OTHER DISEASES CLASSIFIED ELSW 09/24/2007  . WEIGHT LOSS, ABNORMAL 01/07/2007  . Hyperlipidemia 09/22/2006  . Essential hypertension 09/22/2006  . GERD 09/22/2006  . OSTEOARTHRITIS 09/22/2006    Mikle Bosworth PTA 02/27/2016, 8:48 AM  [facility] [address], [place], [place], [address] Phone: [phone]   Fax:  [phone]  Name: Darin [name] MRN: [id number] Date of Birth: 06/14/1931

## 2016-03-01 ENCOUNTER — Ambulatory Visit: Payer: Medicare Other | Admitting: Physical Therapy

## 2016-03-01 DIAGNOSIS — M25561 Pain in right knee: Secondary | ICD-10-CM | POA: Diagnosis not present

## 2016-03-01 DIAGNOSIS — M6281 Muscle weakness (generalized): Secondary | ICD-10-CM | POA: Diagnosis not present

## 2016-03-01 DIAGNOSIS — G8929 Other chronic pain: Secondary | ICD-10-CM | POA: Diagnosis not present

## 2016-03-01 DIAGNOSIS — M545 Low back pain: Secondary | ICD-10-CM | POA: Diagnosis not present

## 2016-03-01 DIAGNOSIS — R296 Repeated falls: Secondary | ICD-10-CM | POA: Diagnosis not present

## 2016-03-01 DIAGNOSIS — R262 Difficulty in walking, not elsewhere classified: Secondary | ICD-10-CM | POA: Diagnosis not present

## 2016-03-01 NOTE — Therapy (Signed)
[facility] [address], [place], [place], [address] Phone: [phone]   Fax:  [phone]  Physical Therapy Treatment  Patient Details  Name: Darin [name] MRN: [id number] Date of Birth: 11-02-1931 Referring Provider: Dr. Elease Hashimoto  Encounter Date: 03/01/2016      PT End of Session - 03/01/16 0802    Visit Number 12   Number of Visits 20   Date for PT Re-Evaluation 03/12/16   Authorization Type Medicare g codes;  KX at visit 15   PT Start Time 0759   PT Stop Time 0841   PT Time Calculation (min) 42 min   Activity Tolerance Patient tolerated treatment well   Behavior During Therapy [facility] for tasks assessed/performed      Past Medical History:  Diagnosis Date  . Arthritis   . GERD (gastroesophageal reflux disease)   . H/O hiatal hernia    "FOR YEARS" " NO PROBLEM"  . Hyperlipidemia   . Hypertension   . Knee pain   . Neuropathy (HCC)    PERIPHERAL   . Osteoporosis   . PMR (polymyalgia rheumatica) (HCC)   . Polymyalgia ([place]) 5 YEARS    Past Surgical History:  Procedure Laterality Date  . CARPAL TUNNEL RELEASE    . CATARACT EXTRACTION     right  . INGUINAL HERNIA REPAIR  10/14/2011   Procedure: HERNIA REPAIR INGUINAL ADULT;  Surgeon: [name], MD;  Location: [place];  Service: General;  Laterality: Right;  Right Inguinal Hernia Repair with Mesh  . TONSILLECTOMY    . VARICOSE VEIN SURGERY      There were no vitals filed for this visit.      Subjective Assessment - 03/01/16 0800    Pertinent History chronic lumbar DDD, polyneuropathy;  HTN   Limitations Walking;House hold activities;Standing   How long can you sit comfortably? as long as  I want but trouble rising following    How long can you stand comfortably? 5 min   How long can you walk comfortably? 10 min   Diagnostic tests none recently   Patient Stated Goals walk normally;  maintain my balance   Currently in Pain? Yes   Pain  Score 4    Pain Location Leg   Pain Orientation Right   Pain Descriptors / Indicators Sore   Pain Onset More than a month ago   Pain Frequency Constant   Aggravating Factors  Mornign with walking and standing, after exercise   Pain Relieving Factors moveing around   Multiple Pain Sites No                         [facility] Adult PT Treatment/Exercise - 03/01/16 0001      Lumbar Exercises: Stretches   Active Hamstring Stretch 3 reps;20 seconds  seated     Lumbar Exercises: Machines for Strengthening   Leg Press Seat 8 #60 2x10  VC to not extend knees all the way     Lumbar Exercises: Seated   Long Arc Quad on Chair Strengthening;Both;2 sets;15 reps   LAQ on Chair Weights (lbs) 3   Sit to Stand --  3x10   Sit to Stand Limitations from high table no arms 15x     Knee/Hip Exercises: Clinical research associate Both;10 seconds  with neck extension   Soleus Stretch Both;2 reps;10 seconds  with neck extension     Knee/Hip Exercises: Aerobic   Nustep Level 2 x  10 minutes  PT present to discuss progress     Knee/Hip Exercises: Standing   Heel Raises Both;1 set;10 reps   Hip Extension Stengthening;Both;2 sets;10 reps  With alt arm extension   Forward Step Up Both;20 reps   Rebounder Marching, single leg balance  x1 minute each   Other Standing Knee Exercises Ladder walking  CGA forward and side step                  PT Short Term Goals - 02/27/16 0807      PT SHORT TERM GOAL #3   Title The patient will have improved hip flexor muscle length to 5 degrees and HS length to 55 degrees needed for improved stride length with ambulation   Time 4   Period Weeks   Status Partially Met     PT SHORT TERM GOAL #4   Title BERG balance test improved from 29/56 to 33/56 indicating improved safety for standing ADLs like grooming and dresssing   Baseline 33   Time 4   Period Weeks   Status Achieved           PT Long Term Goals - 02/27/16 3545      PT  LONG TERM GOAL #2   Title reduce FOTO to < or = to 46% limitation indicating improved function with ADLs   Time 8   Period Weeks   Status On-going     PT LONG TERM GOAL #3   Title perform TUG in < or = to 18 seconds to reduce risk of falls   Time 8   Period Weeks   Status On-going               Plan - 03/01/16 6256    Clinical Impression Statement Pt is progressing with strength and stability in Bil LE. Continiues to have some pain. Pt able to tolerate all exercises well today with increased reps and resistance. CGA with ladder and VC to pick feet up higher but did well. Pt will continue to benefit from skilled therapy for strengthening and balance.    Rehab Potential Good   PT Frequency 2x / week   PT Duration 8 weeks   PT Treatment/Interventions ADLs/Self Care Home Management;Moist Heat;Electrical Stimulation;Therapeutic exercise;Neuromuscular re-education;Patient/family education;Manual techniques   PT Next Visit Plan Extensor stretching, posture and LE strength   Consulted and Agree with Plan of Care Patient      Patient will benefit from skilled therapeutic intervention in order to improve the following deficits and impairments:  Pain, Abnormal gait, Decreased balance, Decreased activity tolerance, Decreased range of motion, Decreased strength, Decreased mobility, Impaired flexibility, Difficulty walking  Visit Diagnosis: Muscle weakness (generalized)  Difficulty in walking, not elsewhere classified     Problem List Patient Active Problem List   Diagnosis Date Noted  . Leg edema, right 02/20/2015  . BPH (benign prostatic hypertrophy) 12/09/2014  . Chronic radicular low back pain 08/06/2012  . COPD (chronic obstructive pulmonary disease) ([place]) 04/21/2012  . Hyponatremia 03/29/2012  . Localized osteoarthrosis not specified whether primary or secondary, lower leg 06/20/2010  . VARICOSE VEINS LOWER EXTREMITIES W/INFLAMMATION 02/12/2010  . [place] LOCALIZED  OSTEOARTHROSIS INVOLVING HAND 10/16/2009  . DUPUYTREN'S CONTRACTURE, RIGHT 07/04/2009  . DEGENERATIVE DISC DISEASE, LUMBOSACRAL SPINE W/RADICULOPATHY 06/19/2009  . CARCINOMA, BASAL CELL 02/07/2009  . ANEMIA OF CHRONIC DISEASE 02/07/2009  . BURSITIS, RIGHT SHOULDER 10/07/2008  . CATARACT ASSOCIATED WITH OTHER SYNDROMES 08/01/2008  . DRY SKIN 08/01/2008  . CERUMEN IMPACTION, BILATERAL  03/01/2008  . CRAMP IN LIMB 01/18/2008  . DEPRESSION, SITUATIONAL, ACUTE 12/28/2007  . CONSTIPATION, DRUG INDUCED 12/28/2007  . POLYNEUROPATHY OTHER DISEASES CLASSIFIED ELSW 09/24/2007  . WEIGHT LOSS, ABNORMAL 01/07/2007  . Hyperlipidemia 09/22/2006  . Essential hypertension 09/22/2006  . GERD 09/22/2006  . OSTEOARTHRITIS 09/22/2006    Mikle Bosworth PTA 03/01/2016, 8:42 AM  [facility] [address] [place], [place], [address] Phone: [phone]   Fax:  [phone]  Name: Jrue Jarriel MRN: [id number] Date of Birth: 02-24-1932

## 2016-03-05 ENCOUNTER — Ambulatory Visit: Payer: Medicare Other | Admitting: Physical Therapy

## 2016-03-05 DIAGNOSIS — M545 Low back pain, unspecified: Secondary | ICD-10-CM

## 2016-03-05 DIAGNOSIS — R296 Repeated falls: Secondary | ICD-10-CM | POA: Diagnosis not present

## 2016-03-05 DIAGNOSIS — G8929 Other chronic pain: Secondary | ICD-10-CM | POA: Diagnosis not present

## 2016-03-05 DIAGNOSIS — R262 Difficulty in walking, not elsewhere classified: Secondary | ICD-10-CM | POA: Diagnosis not present

## 2016-03-05 DIAGNOSIS — M6281 Muscle weakness (generalized): Secondary | ICD-10-CM

## 2016-03-05 DIAGNOSIS — M25561 Pain in right knee: Secondary | ICD-10-CM | POA: Diagnosis not present

## 2016-03-05 NOTE — Therapy (Signed)
[facility] [address], [place], [place], [address] Phone: [phone]   Fax:  [phone]  Physical Therapy Treatment  Patient Details  Name: Darin [name] MRN: [id number] Date of Birth: 1931-11-11 Referring Provider: Dr. Elease Hashimoto  Encounter Date: 03/05/2016      PT End of Session - 03/05/16 1004    Visit Number 13   Number of Visits 20   Date for PT Re-Evaluation 03/12/16   Authorization Type Medicare g codes;  KX at visit 15   PT Start Time 0936   PT Stop Time 1018   PT Time Calculation (min) 42 min   Activity Tolerance Patient tolerated treatment well      Past Medical History:  Diagnosis Date  . Arthritis   . GERD (gastroesophageal reflux disease)   . H/O hiatal hernia    "FOR YEARS" " NO PROBLEM"  . Hyperlipidemia   . Hypertension   . Knee pain   . Neuropathy (HCC)    PERIPHERAL   . Osteoporosis   . PMR (polymyalgia rheumatica) (HCC)   . Polymyalgia ([place]) 5 YEARS    Past Surgical History:  Procedure Laterality Date  . CARPAL TUNNEL RELEASE    . CATARACT EXTRACTION     right  . INGUINAL HERNIA REPAIR  10/14/2011   Procedure: HERNIA REPAIR INGUINAL ADULT;  Surgeon: [name], MD;  Location: [place];  Service: General;  Laterality: Right;  Right Inguinal Hernia Repair with Mesh  . TONSILLECTOMY    . VARICOSE VEIN SURGERY      There were no vitals filed for this visit.      Subjective Assessment - 03/05/16 0937    Subjective Continues to have left leg tightness and cramping during the night, right leg hurts today too.   States his legs usually hurt less after therapy.  "I think therapy helps but at my age it is slow."  I'm a little better getting up and down.     Currently in Pain? Yes   Pain Score 5    Pain Location Leg   Pain Orientation Right   Pain Type Chronic pain                         [facility] Adult PT Treatment/Exercise - 03/05/16 0001      Lumbar  Exercises: Machines for Strengthening   Leg Press Seat 8 #60 3x10  VC to not extend knees all the way     Knee/Hip Exercises: Stretches   Other Knee/Hip Stretches psoas doorway stretch 3x5 right and left with UE movements     Knee/Hip Exercises: Aerobic   Nustep Level 2 x 10 minutes  PT present to discuss progress     Knee/Hip Exercises: Standing   Heel Raises Both;1 set;10 reps   Rebounder Marching, single leg balance  x1 minute each   Other Standing Knee Exercises hip extension and abduction isometric 5x right and left   Other Standing Knee Exercises sit to stand from high table (small range)  40x                  PT Short Term Goals - 03/05/16 2058      PT SHORT TERM GOAL #1   Title be independent in initial HEP  02/13/16   Status Achieved     PT SHORT TERM GOAL #2   Title perform TUG in < or = to 20 seconds to improve balance  Time 4   Period Weeks   Status On-going     PT SHORT TERM GOAL #3   Title The patient will have improved hip flexor muscle length to 5 degrees and HS length to 55 degrees needed for improved stride length with ambulation   Time 4   Period Weeks   Status Partially Met     PT SHORT TERM GOAL #4   Title BERG balance test improved from 29/56 to 33/56 indicating improved safety for standing ADLs like grooming and dresssing   Status Achieved           PT Long Term Goals - 03/05/16 2059      PT LONG TERM GOAL #1   Title be independent in advanced HEP for further improvements in strength, ROM and balance  03/12/16   Time 8   Period Weeks   Status On-going     PT LONG TERM GOAL #2   Title reduce FOTO to < or = to 46% limitation indicating improved function with ADLs   Time 8   Period Weeks   Status On-going     PT LONG TERM GOAL #3   Title perform TUG in < or = to 18 seconds to reduce risk of falls   Time 8   Period Weeks   Status On-going     PT LONG TERM GOAL #4   Title Improved hip and knee strength to at least 3+/5  for greater ease with rising from a chair and  with standing > 10 min    Time 8   Period Weeks   Status On-going     PT LONG TERM GOAL #5   Title The patient will be able to ambulate 400 feet in six minutes needed for shorter distance community ambulation with appropriate assistive device   Time 8   Period Weeks   Status On-going     PT LONG TERM GOAL #6   Title BERG balance score improved to 36/56 indicating improved safety with walking and standing dynamic activities   Time 8   Period Weeks   Status On-going               Plan - 03/05/16 1005    Clinical Impression Statement Continues ambulate with slow, short, shuffled steps.  He has improved with standing tolerance and is able to rise with greater ease with sit to stand.  Reports no increase in LE discomfort with exercise but does experience muscular fatigue in LEs.  The patient plans to travel to [place] and [place] in February and will need further PT to address gait abnormalities, balance and weakness needed for this trip.  Therapist closely monitoring response throughout treatment session and providing close supervision for safety.     PT Next Visit Plan ERO  next visit;  BERG;  TUG;  MMT;  possible 6 MWT;  progress toward goals;  increase resistance on leg press      Patient will benefit from skilled therapeutic intervention in order to improve the following deficits and impairments:     Visit Diagnosis: Muscle weakness (generalized)  Difficulty in walking, not elsewhere classified  Repeated falls  Chronic midline low back pain without sciatica     Problem List Patient Active Problem List   Diagnosis Date Noted  . Leg edema, right 02/20/2015  . BPH (benign prostatic hypertrophy) 12/09/2014  . Chronic radicular low back pain 08/06/2012  . COPD (chronic obstructive pulmonary disease) ([place]) 04/21/2012  . Hyponatremia 03/29/2012  .  Localized osteoarthrosis not specified whether primary or secondary, lower leg  06/20/2010  . VARICOSE VEINS LOWER EXTREMITIES W/INFLAMMATION 02/12/2010  . [place] LOCALIZED OSTEOARTHROSIS INVOLVING HAND 10/16/2009  . DUPUYTREN'S CONTRACTURE, RIGHT 07/04/2009  . DEGENERATIVE DISC DISEASE, LUMBOSACRAL SPINE W/RADICULOPATHY 06/19/2009  . CARCINOMA, BASAL CELL 02/07/2009  . ANEMIA OF CHRONIC DISEASE 02/07/2009  . BURSITIS, RIGHT SHOULDER 10/07/2008  . CATARACT ASSOCIATED WITH OTHER SYNDROMES 08/01/2008  . DRY SKIN 08/01/2008  . CERUMEN IMPACTION, BILATERAL 03/01/2008  . CRAMP IN LIMB 01/18/2008  . DEPRESSION, SITUATIONAL, ACUTE 12/28/2007  . CONSTIPATION, DRUG INDUCED 12/28/2007  . POLYNEUROPATHY OTHER DISEASES CLASSIFIED ELSW 09/24/2007  . WEIGHT LOSS, ABNORMAL 01/07/2007  . Hyperlipidemia 09/22/2006  . Essential hypertension 09/22/2006  . GERD 09/22/2006  . OSTEOARTHRITIS 09/22/2006   Ruben Im, PT 03/05/16 9:00 PM Phone: [phone] Fax: [phone]  Alvera Singh 03/05/2016, 9:00 PM  [facility] [address], [place], [place], [address] Phone: [phone]   Fax:  [phone]  Name: Jaeden Messer MRN: [id number] Date of Birth: 03/25/1932

## 2016-03-08 ENCOUNTER — Ambulatory Visit: Payer: Medicare Other | Admitting: Physical Therapy

## 2016-03-08 DIAGNOSIS — R296 Repeated falls: Secondary | ICD-10-CM | POA: Diagnosis not present

## 2016-03-08 DIAGNOSIS — R262 Difficulty in walking, not elsewhere classified: Secondary | ICD-10-CM | POA: Diagnosis not present

## 2016-03-08 DIAGNOSIS — M6281 Muscle weakness (generalized): Secondary | ICD-10-CM | POA: Diagnosis not present

## 2016-03-08 DIAGNOSIS — G8929 Other chronic pain: Secondary | ICD-10-CM

## 2016-03-08 DIAGNOSIS — M545 Low back pain: Secondary | ICD-10-CM | POA: Diagnosis not present

## 2016-03-08 DIAGNOSIS — M25561 Pain in right knee: Secondary | ICD-10-CM | POA: Diagnosis not present

## 2016-03-08 NOTE — Therapy (Signed)
[facility] [address] [place], [place], [address] Phone: [phone]   Fax:  [phone]  Physical Therapy Treatment/Recertification  Patient Details  Name: Darin [name] MRN: [id number] Date of Birth: 01-25-1932 Referring Provider: Dr. Elease Hashimoto  Encounter Date: 03/08/2016      PT End of Session - 03/08/16 1255    Visit Number 14   Number of Visits 20   Date for PT Re-Evaluation 05/03/16   Authorization Type Medicare g codes;  KX at visit 15   PT Start Time 0758   PT Stop Time 0845   PT Time Calculation (min) 47 min   Activity Tolerance Patient tolerated treatment well      Past Medical History:  Diagnosis Date  . Arthritis   . GERD (gastroesophageal reflux disease)   . H/O hiatal hernia    "FOR YEARS" " NO PROBLEM"  . Hyperlipidemia   . Hypertension   . Knee pain   . Neuropathy (HCC)    PERIPHERAL   . Osteoporosis   . PMR (polymyalgia rheumatica) (HCC)   . Polymyalgia ([place]) 5 YEARS    Past Surgical History:  Procedure Laterality Date  . CARPAL TUNNEL RELEASE    . CATARACT EXTRACTION     right  . INGUINAL HERNIA REPAIR  10/14/2011   Procedure: HERNIA REPAIR INGUINAL ADULT;  Surgeon: [name], MD;  Location: [place];  Service: General;  Laterality: Right;  Right Inguinal Hernia Repair with Mesh  . TONSILLECTOMY    . VARICOSE VEIN SURGERY      There were no vitals filed for this visit.      Subjective Assessment - 03/08/16 0755    Subjective Reports his right leg is bothering today.  States his legs usually feel better after therapy.  Travelling to [place] 05/04/15 (8 weeks away).     Currently in Pain? Yes   Pain Score 6    Pain Location Leg   Pain Orientation Right   Pain Type Chronic pain            [facility] PT Assessment - 03/08/16 0001      Observation/Other Assessments   Focus on Therapeutic Outcomes (FOTO)  51% limitation     Strength   Right Hip ABduction 3+/5    Left Hip ABduction 3+/5     Flexibility   Soft Tissue Assessment /Muscle Length --  hip extension 5 degrees;  bilateral HS 55 degrees     6 Minute Walk- Baseline   6 Minute Walk- Baseline yes   Perceived Rate of Exertion ([place]) 11- Fairly light     6 minute walk test results    Aerobic Endurance Distance Walked 560     Berg Balance Test   Sit to Stand Able to stand  independently using hands   Standing Unsupported Able to stand safely 2 minutes   Sitting with Back Unsupported but Feet Supported on Floor or Stool Able to sit safely and securely 2 minutes   Stand to Sit Controls descent by using hands   Transfers Able to transfer safely, definite need of hands   Standing Unsupported with Eyes Closed Able to stand 10 seconds safely   Standing Ubsupported with Feet Together Needs help to attain position and unable to hold for 15 seconds   From Standing, Reach Forward with Outstretched Arm Can reach forward >5 cm safely (2")   From Standing Position, Pick up Object from Floor Able to pick up shoe, needs supervision  From Standing Position, Turn to Look Behind Over each Shoulder Turn sideways only but maintains balance   Turn 360 Degrees Able to turn 360 degrees safely but slowly   Standing Unsupported, Alternately Place Feet on Step/Stool Needs assistance to keep from falling or unable to try   Standing Unsupported, One Foot in [place] to take small step independently and hold 30 seconds   Standing on One Leg Tries to lift leg/unable to hold 3 seconds but remains standing independently   Total Score 33     Timed Up and Go Test   Manual TUG (seconds) 19.2                     [facility] Adult PT Treatment/Exercise - 03/08/16 0001      Lumbar Exercises: Machines for Strengthening   Leg Press Seat 8 #65 3x10     Knee/Hip Exercises: Aerobic   Nustep L3 10 min seat 12     Knee/Hip Exercises: Standing   Other Standing Knee Exercises step taps with 1 UE assist 10x    Other Standing Knee Exercises lunge stretch on second step 5x right and left                  PT Short Term Goals - 03/08/16 0835      PT SHORT TERM GOAL #1   Title be independent in initial HEP  02/13/16   Status Achieved     PT SHORT TERM GOAL #2   Title perform TUG in < or = to 20 seconds to improve balance   Status Achieved     PT SHORT TERM GOAL #3   Title The patient will have improved hip flexor muscle length to 5 degrees and HS length to 55 degrees needed for improved stride length with ambulation   Status Achieved     PT SHORT TERM GOAL #4   Title BERG balance test improved from 29/56 to 33/56 indicating improved safety for standing ADLs like grooming and dresssing   Status Achieved           PT Long Term Goals - 03/08/16 0827      PT LONG TERM GOAL #1   Title be independent in advanced HEP for further improvements in strength, ROM and balance  03/12/16   Time 8   Period Weeks   Status On-going     PT LONG TERM GOAL #2   Title reduce FOTO to < or = to 46% limitation indicating improved function with ADLs   Time 8   Period Weeks   Status On-going     PT LONG TERM GOAL #3   Title perform TUG in < or = to 18 seconds to reduce risk of falls   Time 8   Period Weeks   Status On-going     PT LONG TERM GOAL #4   Title Improved hip and knee strength to at least 4-/5 for greater ease with rising from a chair and  with standing > 10 min    Time 8   Period Weeks   Status Revised     PT LONG TERM GOAL #5   Title The patient will be able to ambulate 400 feet in six minutes needed for shorter distance community ambulation with appropriate assistive device   Status Achieved     Additional Long Term Goals   Additional Long Term Goals Yes     PT LONG TERM GOAL #6   Title BERG balance  score improved to 37/56 indicating improved safety with walking and standing dynamic activities   Time 8   Period Weeks   Status On-going     PT LONG TERM GOAL #7    Title Able to walk 650 feet in 6 minutes needed for travelling    Time 8   Period Weeks   Status New     PT LONG TERM GOAL #8   Title Timed up and go test improved to 17 sec indicating improved agility with sit to stand and gait speed   Time 8   Period Weeks   Status New               Plan - 03/08/16 1255    Clinical Impression Statement Patient continues to ambulate with a wide base of support with slow,shuffled steps.  His BERG balance test indicates he is at "near 100% risk of falls" and suggests he needs a walker on a full-time basis.  Discussed results with patient and highly encouraged him to use a walker particularly for his upcoming trip to [place] and [place].  He states that this is not possible because it would be too cumbersome.  He continues to lack strength to rise from a chair without significant UE use.  His gait speed Timed up and Go has improved but still below norm for his age.  His FOTO functional outcome score has improved from 63% to 51% limitation.  He would benefit from continued strengthening and balance ex to address these deficits especially considering his upcoming trip and his risk of falls.     Rehab Potential Good   PT Frequency 2x / week   PT Duration 8 weeks   PT Treatment/Interventions ADLs/Self Care Home Management;Moist Heat;Electrical Stimulation;Therapeutic exercise;Neuromuscular re-education;Patient/family education;Manual techniques   PT Next Visit Plan balance with narrow base of support and dynamic movements;  gait endurance;  increase resistance on leg press to 70#;  KX next visit      Patient will benefit from skilled therapeutic intervention in order to improve the following deficits and impairments:  Pain, Abnormal gait, Decreased balance, Decreased activity tolerance, Decreased range of motion, Decreased strength, Decreased mobility, Impaired flexibility, Difficulty walking  Visit Diagnosis: Muscle weakness (generalized) - Plan: PT plan of  care cert/re-cert  Difficulty in walking, not elsewhere classified - Plan: PT plan of care cert/re-cert  Repeated falls - Plan: PT plan of care cert/re-cert  Chronic midline low back pain without sciatica - Plan: PT plan of care cert/re-cert     Problem List Patient Active Problem List   Diagnosis Date Noted  . Leg edema, right 02/20/2015  . BPH (benign prostatic hypertrophy) 12/09/2014  . Chronic radicular low back pain 08/06/2012  . COPD (chronic obstructive pulmonary disease) ([place]) 04/21/2012  . Hyponatremia 03/29/2012  . Localized osteoarthrosis not specified whether primary or secondary, lower leg 06/20/2010  . VARICOSE VEINS LOWER EXTREMITIES W/INFLAMMATION 02/12/2010  . [place] LOCALIZED OSTEOARTHROSIS INVOLVING HAND 10/16/2009  . DUPUYTREN'S CONTRACTURE, RIGHT 07/04/2009  . DEGENERATIVE DISC DISEASE, LUMBOSACRAL SPINE W/RADICULOPATHY 06/19/2009  . CARCINOMA, BASAL CELL 02/07/2009  . ANEMIA OF CHRONIC DISEASE 02/07/2009  . BURSITIS, RIGHT SHOULDER 10/07/2008  . CATARACT ASSOCIATED WITH OTHER SYNDROMES 08/01/2008  . DRY SKIN 08/01/2008  . CERUMEN IMPACTION, BILATERAL 03/01/2008  . CRAMP IN LIMB 01/18/2008  . DEPRESSION, SITUATIONAL, ACUTE 12/28/2007  . CONSTIPATION, DRUG INDUCED 12/28/2007  . POLYNEUROPATHY OTHER DISEASES CLASSIFIED ELSW 09/24/2007  . WEIGHT LOSS, ABNORMAL 01/07/2007  . Hyperlipidemia 09/22/2006  . Essential  hypertension 09/22/2006  . GERD 09/22/2006  . OSTEOARTHRITIS 09/22/2006   Ruben Im, PT 03/08/16 1:06 PM Phone: [phone] Fax: [phone]  Alvera Singh 03/08/2016, 1:06 PM  [facility] [address], [place], [place], [address] Phone: [phone]   Fax:  [phone]  Name: Darin [name] MRN: [id number] Date of Birth: 03/25/1932

## 2016-03-12 ENCOUNTER — Ambulatory Visit: Payer: Medicare Other | Admitting: Physical Therapy

## 2016-03-12 ENCOUNTER — Encounter: Payer: Self-pay | Admitting: Physical Therapy

## 2016-03-12 DIAGNOSIS — R296 Repeated falls: Secondary | ICD-10-CM | POA: Diagnosis not present

## 2016-03-12 DIAGNOSIS — M6281 Muscle weakness (generalized): Secondary | ICD-10-CM

## 2016-03-12 DIAGNOSIS — M545 Low back pain: Secondary | ICD-10-CM | POA: Diagnosis not present

## 2016-03-12 DIAGNOSIS — M25561 Pain in right knee: Secondary | ICD-10-CM | POA: Diagnosis not present

## 2016-03-12 DIAGNOSIS — G8929 Other chronic pain: Secondary | ICD-10-CM | POA: Diagnosis not present

## 2016-03-12 DIAGNOSIS — R262 Difficulty in walking, not elsewhere classified: Secondary | ICD-10-CM | POA: Diagnosis not present

## 2016-03-12 NOTE — Therapy (Signed)
[facility] [address] [place], [place], [address] Phone: [phone]   Fax:  [phone]  Physical Therapy Treatment  Patient Details  Name: Darin [name] MRN: [id number] Date of Birth: 12/17/1931 Referring Provider: Dr. Elease Hashimoto  Encounter Date: 03/12/2016      PT End of Session - 03/12/16 0803    Visit Number 15   Number of Visits 20   Date for PT Re-Evaluation 05/03/16   Authorization Type Medicare g codes;  KX at visit 15   PT Start Time 0757   PT Stop Time 0844   PT Time Calculation (min) 47 min   Activity Tolerance Patient tolerated treatment well   Behavior During Therapy [facility] for tasks assessed/performed      Past Medical History:  Diagnosis Date  . Arthritis   . GERD (gastroesophageal reflux disease)   . H/O hiatal hernia    "FOR YEARS" " NO PROBLEM"  . Hyperlipidemia   . Hypertension   . Knee pain   . Neuropathy (HCC)    PERIPHERAL   . Osteoporosis   . PMR (polymyalgia rheumatica) (HCC)   . Polymyalgia ([place]) 5 YEARS    Past Surgical History:  Procedure Laterality Date  . CARPAL TUNNEL RELEASE    . CATARACT EXTRACTION     right  . INGUINAL HERNIA REPAIR  10/14/2011   Procedure: HERNIA REPAIR INGUINAL ADULT;  Surgeon: [name], MD;  Location: [place];  Service: General;  Laterality: Right;  Right Inguinal Hernia Repair with Mesh  . TONSILLECTOMY    . VARICOSE VEIN SURGERY      There were no vitals filed for this visit.      Subjective Assessment - 03/12/16 0801    Subjective Pt reports legs feeling sore.   Pertinent History chronic lumbar DDD, polyneuropathy;  HTN   Limitations Walking;House hold activities;Standing   How long can you sit comfortably? as long as  I want but trouble rising following    How long can you stand comfortably? 5 min   How long can you walk comfortably? 10 min   Diagnostic tests none recently   Patient Stated Goals walk normally;  maintain my  balance   Currently in Pain? Yes   Pain Score 6    Pain Location Leg   Pain Orientation Right   Pain Descriptors / Indicators Sore   Pain Type Chronic pain   Pain Onset More than a month ago   Pain Frequency Constant                         [facility] Adult PT Treatment/Exercise - 03/12/16 0001      Lumbar Exercises: Machines for Strengthening   Leg Press Seat 8 #70 3x10     Lumbar Exercises: Seated   Sit to Stand --  3x10   Sit to Stand Limitations high/low table     Knee/Hip Exercises: Stretches   Gastroc Stretch Both;10 seconds  with neck extension   Soleus Stretch Both;2 reps;10 seconds  with neck extension   Other Knee/Hip Stretches psoas doorway stretch 3x5 right and left with UE movements     Knee/Hip Exercises: Aerobic   Stationary Bike L0 x5 minutes   for hip ROM   Nustep L3 10 min seat 12     Knee/Hip Exercises: Standing   Rebounder Marching, standing balance, tandem stance  x1 minute each   Other Standing Knee Exercises Ladder walking for improved gait  pattern  CGA forward and side   Other Standing Knee Exercises Step taps                  PT Short Term Goals - 03/12/16 0804      PT SHORT TERM GOAL #1   Title be independent in initial HEP  02/13/16   Time 4   Period Weeks   Status Achieved     PT SHORT TERM GOAL #2   Title perform TUG in < or = to 20 seconds to improve balance   Time 4   Period Weeks   Status Achieved           PT Long Term Goals - 03/12/16 [id number]      PT LONG TERM GOAL #1   Title be independent in advanced HEP for further improvements in strength, ROM and balance  03/12/16   Time 8   Period Weeks   Status On-going     PT LONG TERM GOAL #2   Title reduce FOTO to < or = to 46% limitation indicating improved function with ADLs   Time 8   Period Weeks   Status On-going     PT LONG TERM GOAL #3   Title perform TUG in < or = to 18 seconds to reduce risk of falls   Time 8   Period Weeks   Status  On-going     PT LONG TERM GOAL #4   Title Improved hip and knee strength to at least 4-/5 for greater ease with rising from a chair and  with standing > 10 min    Time 8   Period Weeks   Status On-going     PT LONG TERM GOAL #6   Title BERG balance score improved to 37/56 indicating improved safety with walking and standing dynamic activities   Time 8   Period Weeks   Status On-going     PT LONG TERM GOAL #7   Title Able to walk 650 feet in 6 minutes needed for travelling    Time 8   Period Weeks   Status On-going     PT LONG TERM GOAL #8   Title Timed up and go test improved to 17 sec indicating improved agility with sit to stand and gait speed   Time 8   Period Weeks   Status On-going               Plan - 03/12/16 0808    Clinical Impression Statement Pt continues to have limited hip ROM and poor balance. Able to tolerate all exercises well with increase resistance. Began stationary bike to improve hip ROM. Verbal cueing needed with ladder stepping to pick feet up and take larger steps. Discussed with patient use of cane for safety and continueing exercises at home. Pt will continue to benefit from skilled therapy for LE strength and balance.    Rehab Potential Good   PT Frequency 2x / week   PT Duration 8 weeks   PT Treatment/Interventions ADLs/Self Care Home Management;Moist Heat;Electrical Stimulation;Therapeutic exercise;Neuromuscular re-education;Patient/family education;Manual techniques   Consulted and Agree with Plan of Care Patient      Patient will benefit from skilled therapeutic intervention in order to improve the following deficits and impairments:  Pain, Abnormal gait, Decreased balance, Decreased activity tolerance, Decreased range of motion, Decreased strength, Decreased mobility, Impaired flexibility, Difficulty walking  Visit Diagnosis: Muscle weakness (generalized)  Difficulty in walking, not elsewhere classified  Repeated  falls  Problem List Patient Active Problem List   Diagnosis Date Noted  . Leg edema, right 02/20/2015  . BPH (benign prostatic hypertrophy) 12/09/2014  . Chronic radicular low back pain 08/06/2012  . COPD (chronic obstructive pulmonary disease) ([place]) 04/21/2012  . Hyponatremia 03/29/2012  . Localized osteoarthrosis not specified whether primary or secondary, lower leg 06/20/2010  . VARICOSE VEINS LOWER EXTREMITIES W/INFLAMMATION 02/12/2010  . [place] LOCALIZED OSTEOARTHROSIS INVOLVING HAND 10/16/2009  . DUPUYTREN'S CONTRACTURE, RIGHT 07/04/2009  . DEGENERATIVE DISC DISEASE, LUMBOSACRAL SPINE W/RADICULOPATHY 06/19/2009  . CARCINOMA, BASAL CELL 02/07/2009  . ANEMIA OF CHRONIC DISEASE 02/07/2009  . BURSITIS, RIGHT SHOULDER 10/07/2008  . CATARACT ASSOCIATED WITH OTHER SYNDROMES 08/01/2008  . DRY SKIN 08/01/2008  . CERUMEN IMPACTION, BILATERAL 03/01/2008  . CRAMP IN LIMB 01/18/2008  . DEPRESSION, SITUATIONAL, ACUTE 12/28/2007  . CONSTIPATION, DRUG INDUCED 12/28/2007  . POLYNEUROPATHY OTHER DISEASES CLASSIFIED ELSW 09/24/2007  . WEIGHT LOSS, ABNORMAL 01/07/2007  . Hyperlipidemia 09/22/2006  . Essential hypertension 09/22/2006  . GERD 09/22/2006  . OSTEOARTHRITIS 09/22/2006    Mikle Bosworth PTA 03/12/2016, 8:53 AM  [facility] [address], [place], [place], [address] Phone: [phone]   Fax:  [phone]  Name: Darin [name] MRN: [id number] Date of Birth: [birth year]

## 2016-03-15 ENCOUNTER — Ambulatory Visit: Payer: Medicare Other | Admitting: Physical Therapy

## 2016-03-15 ENCOUNTER — Encounter: Payer: Self-pay | Admitting: Physical Therapy

## 2016-03-15 DIAGNOSIS — M545 Low back pain: Secondary | ICD-10-CM | POA: Diagnosis not present

## 2016-03-15 DIAGNOSIS — M6281 Muscle weakness (generalized): Secondary | ICD-10-CM | POA: Diagnosis not present

## 2016-03-15 DIAGNOSIS — R262 Difficulty in walking, not elsewhere classified: Secondary | ICD-10-CM | POA: Diagnosis not present

## 2016-03-15 DIAGNOSIS — R296 Repeated falls: Secondary | ICD-10-CM | POA: Diagnosis not present

## 2016-03-15 DIAGNOSIS — G8929 Other chronic pain: Secondary | ICD-10-CM | POA: Diagnosis not present

## 2016-03-15 DIAGNOSIS — M25561 Pain in right knee: Secondary | ICD-10-CM | POA: Diagnosis not present

## 2016-03-15 NOTE — Therapy (Signed)
[facility] [address], [place], [place], [address] Phone: [phone]   Fax:  [phone]  Physical Therapy Treatment  Patient Details  Name: Darin [name] MRN: [id number] Date of Birth: [birth year] Referring Provider: Dr. Elease Hashimoto  Encounter Date: 03/15/2016      PT End of Session - 03/15/16 0759    Visit Number 16   Number of Visits 20   Date for PT Re-Evaluation 05/03/16   Authorization Type Medicare g codes;  KX at visit 15   PT Start Time 0758   PT Stop Time 0844   PT Time Calculation (min) 46 min   Activity Tolerance Patient tolerated treatment well   Behavior During Therapy [facility] for tasks assessed/performed      Past Medical History:  Diagnosis Date  . Arthritis   . GERD (gastroesophageal reflux disease)   . H/O hiatal hernia    "FOR YEARS" " NO PROBLEM"  . Hyperlipidemia   . Hypertension   . Knee pain   . Neuropathy (HCC)    PERIPHERAL   . Osteoporosis   . PMR (polymyalgia rheumatica) (HCC)   . Polymyalgia ([place]) 5 YEARS    Past Surgical History:  Procedure Laterality Date  . CARPAL TUNNEL RELEASE    . CATARACT EXTRACTION     right  . INGUINAL HERNIA REPAIR  10/14/2011   Procedure: HERNIA REPAIR INGUINAL ADULT;  Surgeon: [name], MD;  Location: [place];  Service: General;  Laterality: Right;  Right Inguinal Hernia Repair with Mesh  . TONSILLECTOMY    . VARICOSE VEIN SURGERY      There were no vitals filed for this visit.      Subjective Assessment - 03/15/16 0800    Subjective Legs feeling sore as usualy. Pt thinks legs are slowly getting stronger.    Pertinent History chronic lumbar DDD, polyneuropathy;  HTN   Limitations Walking;House hold activities;Standing   How long can you sit comfortably? as long as  I want but trouble rising following    Currently in Pain? Yes   Pain Score 5    Pain Location Leg   Pain Orientation Right   Pain Descriptors / Indicators Sore   Pain Type Chronic pain   Pain Radiating Towards Left leg   Pain Onset More than a month ago   Pain Frequency Constant                         [facility] Adult PT Treatment/Exercise - 03/15/16 0001      Lumbar Exercises: Machines for Strengthening   Leg Press Seat 8 #70 3x10     Lumbar Exercises: Seated   Sit to Stand --  3x10   Sit to Stand Limitations high/low table     Knee/Hip Exercises: Stretches   Gastroc Stretch Both;10 seconds  with neck extension   Soleus Stretch Both;2 reps;10 seconds  with neck extension   Other Knee/Hip Stretches psoas doorway stretch 3x5 right and left with UE movements     Knee/Hip Exercises: Aerobic   Stationary Bike L0 x5 minutes   for hip ROM   Nustep L3 10 min seat 12     Knee/Hip Exercises: Standing   Hip Extension 2 sets;10 reps  With alt arm movements   Forward Step Up 2 sets;10 reps   Rebounder Marching, standing balance, tandem stance  x1 minute each   Other Standing Knee Exercises Ladder walking for improved gait pattern  CGA  forward and side                  PT Short Term Goals - 03/12/16 0804      PT SHORT TERM GOAL #1   Title be independent in initial HEP  02/13/16   Time 4   Period Weeks   Status Achieved     PT SHORT TERM GOAL #2   Title perform TUG in < or = to 20 seconds to improve balance   Time 4   Period Weeks   Status Achieved           PT Long Term Goals - 03/12/16 [id number]      PT LONG TERM GOAL #1   Title be independent in advanced HEP for further improvements in strength, ROM and balance  03/12/16   Time 8   Period Weeks   Status On-going     PT LONG TERM GOAL #2   Title reduce FOTO to < or = to 46% limitation indicating improved function with ADLs   Time 8   Period Weeks   Status On-going     PT LONG TERM GOAL #3   Title perform TUG in < or = to 18 seconds to reduce risk of falls   Time 8   Period Weeks   Status On-going     PT LONG TERM GOAL #4   Title Improved hip  and knee strength to at least 4-/5 for greater ease with rising from a chair and  with standing > 10 min    Time 8   Period Weeks   Status On-going     PT LONG TERM GOAL #6   Title BERG balance score improved to 37/56 indicating improved safety with walking and standing dynamic activities   Time 8   Period Weeks   Status On-going     PT LONG TERM GOAL #7   Title Able to walk 650 feet in 6 minutes needed for travelling    Time 8   Period Weeks   Status On-going     PT LONG TERM GOAL #8   Title Timed up and go test improved to 17 sec indicating improved agility with sit to stand and gait speed   Time 8   Period Weeks   Status On-going               Plan - 03/15/16 0827    Clinical Impression Statement Pt progressing well with LE strength. Continues to have forward flexed posture and limited hip flexion. Able to tolerate all exercsies well. Will continue to benefit from skilled therapy for LE strength, posture training, and balance.    Rehab Potential Good   PT Frequency 2x / week   PT Duration 8 weeks   PT Treatment/Interventions ADLs/Self Care Home Management;Moist Heat;Electrical Stimulation;Therapeutic exercise;Neuromuscular re-education;Patient/family education;Manual techniques   PT Next Visit Plan balance with narrow base of support and dynamic movements;  gait endurance;  KX    Consulted and Agree with Plan of Care Patient      Patient will benefit from skilled therapeutic intervention in order to improve the following deficits and impairments:  Pain, Abnormal gait, Decreased balance, Decreased activity tolerance, Decreased range of motion, Decreased strength, Decreased mobility, Impaired flexibility, Difficulty walking  Visit Diagnosis: Muscle weakness (generalized)  Difficulty in walking, not elsewhere classified  Repeated falls  Chronic midline low back pain without sciatica     Problem List Patient Active Problem List   Diagnosis Date  Noted  . Leg  edema, right 02/20/2015  . BPH (benign prostatic hypertrophy) 12/09/2014  . Chronic radicular low back pain 08/06/2012  . COPD (chronic obstructive pulmonary disease) ([place]) 04/21/2012  . Hyponatremia 03/29/2012  . Localized osteoarthrosis not specified whether primary or secondary, lower leg 06/20/2010  . VARICOSE VEINS LOWER EXTREMITIES W/INFLAMMATION 02/12/2010  . [place] LOCALIZED OSTEOARTHROSIS INVOLVING HAND 10/16/2009  . DUPUYTREN'S CONTRACTURE, RIGHT 07/04/2009  . DEGENERATIVE DISC DISEASE, LUMBOSACRAL SPINE W/RADICULOPATHY 06/19/2009  . CARCINOMA, BASAL CELL 02/07/2009  . ANEMIA OF CHRONIC DISEASE 02/07/2009  . BURSITIS, RIGHT SHOULDER 10/07/2008  . CATARACT ASSOCIATED WITH OTHER SYNDROMES 08/01/2008  . DRY SKIN 08/01/2008  . CERUMEN IMPACTION, BILATERAL 03/01/2008  . CRAMP IN LIMB 01/18/2008  . DEPRESSION, SITUATIONAL, ACUTE 12/28/2007  . CONSTIPATION, DRUG INDUCED 12/28/2007  . POLYNEUROPATHY OTHER DISEASES CLASSIFIED ELSW 09/24/2007  . WEIGHT LOSS, ABNORMAL 01/07/2007  . Hyperlipidemia 09/22/2006  . Essential hypertension 09/22/2006  . GERD 09/22/2006  . OSTEOARTHRITIS 09/22/2006    Mikle Bosworth PTA 03/15/2016, 8:43 AM  [facility] [address], [place], [place], [address] Phone: [phone]   Fax:  [phone]  Name: Joshuadavid Arritt MRN: [id number] Date of Birth: 1931-08-26

## 2016-03-19 ENCOUNTER — Ambulatory Visit: Payer: PRIVATE HEALTH INSURANCE | Admitting: Podiatry

## 2016-03-19 ENCOUNTER — Ambulatory Visit: Payer: Medicare Other | Admitting: Physical Therapy

## 2016-03-19 DIAGNOSIS — M25561 Pain in right knee: Secondary | ICD-10-CM

## 2016-03-19 DIAGNOSIS — G8929 Other chronic pain: Secondary | ICD-10-CM | POA: Diagnosis not present

## 2016-03-19 DIAGNOSIS — R262 Difficulty in walking, not elsewhere classified: Secondary | ICD-10-CM

## 2016-03-19 DIAGNOSIS — R296 Repeated falls: Secondary | ICD-10-CM | POA: Diagnosis not present

## 2016-03-19 DIAGNOSIS — M6281 Muscle weakness (generalized): Secondary | ICD-10-CM | POA: Diagnosis not present

## 2016-03-19 DIAGNOSIS — M545 Low back pain: Secondary | ICD-10-CM | POA: Diagnosis not present

## 2016-03-19 NOTE — Therapy (Signed)
[facility] [address] [place], [place], [address] Phone: [phone]   Fax:  [phone]  Physical Therapy Treatment  Patient Details  Name: Darin [name] MRN: [id number] Date of Birth: [birth year] Referring Provider: Dr. Elease Hashimoto  Encounter Date: 03/19/2016      PT End of Session - 03/19/16 0806    Visit Number 17   Number of Visits 20   Date for PT Re-Evaluation 05/03/16   Authorization Type Medicare g codes;  KX at visit 15   PT Start Time 0724   PT Stop Time 0811   PT Time Calculation (min) 47 min   Activity Tolerance Patient tolerated treatment well      Past Medical History:  Diagnosis Date  . Arthritis   . GERD (gastroesophageal reflux disease)   . H/O hiatal hernia    "FOR YEARS" " NO PROBLEM"  . Hyperlipidemia   . Hypertension   . Knee pain   . Neuropathy (HCC)    PERIPHERAL   . Osteoporosis   . PMR (polymyalgia rheumatica) (HCC)   . Polymyalgia ([place]) 5 YEARS    Past Surgical History:  Procedure Laterality Date  . CARPAL TUNNEL RELEASE    . CATARACT EXTRACTION     right  . INGUINAL HERNIA REPAIR  10/14/2011   Procedure: HERNIA REPAIR INGUINAL ADULT;  Surgeon: [name], MD;  Location: [place];  Service: General;  Laterality: Right;  Right Inguinal Hernia Repair with Mesh  . TONSILLECTOMY    . VARICOSE VEIN SURGERY      There were no vitals filed for this visit.      Subjective Assessment - 03/19/16 0726    Subjective My right leg is sore.  He is unable to identify the contributing factor.  My knee and lower leg give me trouble.   The knee cracks with getting up from the chair.  Therapy and exercise do not usually aggravate.     Currently in Pain? Yes   Pain Score 6    Pain Location Leg   Pain Orientation Right   Pain Type Chronic pain   Aggravating Factors  rising;  moving around                         [facility] Adult PT Treatment/Exercise - 03/19/16 0001       Lumbar Exercises: Machines for Strengthening   Leg Press Seat 8 75# 20x;  single leg 35# right/left 15x each     Knee/Hip Exercises: Stretches   Active Hamstring Stretch Right;Left;5 reps   Active Hamstring Stretch Limitations on step    Other Knee/Hip Stretches stair hip flexor stretch 5x right/left     Knee/Hip Exercises: Aerobic   Stationary Bike 5 min forward revolutions   Nustep L3 10 min seat 12     Knee/Hip Exercises: Standing   Hip Extension Right;Left;1 set;15 reps;Knee straight   Extension Limitations red band   Forward Step Up 2 sets;10 reps   Rebounder Marching, standing balance, tandem stance  x1 minute each with head and arm movements   Other Standing Knee Exercises step taps with 1 UE assist 10x                  PT Short Term Goals - 03/19/16 1048      PT SHORT TERM GOAL #1   Title be independent in initial HEP  02/13/16   Status Achieved     PT SHORT  TERM GOAL #2   Title perform TUG in < or = to 20 seconds to improve balance   Status Achieved     PT SHORT TERM GOAL #3   Title The patient will have improved hip flexor muscle length to 5 degrees and HS length to 55 degrees needed for improved stride length with ambulation   Status Achieved     PT SHORT TERM GOAL #4   Title BERG balance test improved from 29/56 to 33/56 indicating improved safety for standing ADLs like grooming and dresssing   Status Achieved           PT Long Term Goals - 03/19/16 1048      PT LONG TERM GOAL #1   Title be independent in advanced HEP for further improvements in strength, ROM and balance  03/12/16   Time 8   Period Weeks   Status On-going     PT LONG TERM GOAL #2   Title reduce FOTO to < or = to 46% limitation indicating improved function with ADLs   Time 8   Period Weeks   Status On-going     PT LONG TERM GOAL #3   Title perform TUG in < or = to 18 seconds to reduce risk of falls   Time 8   Period Weeks   Status On-going     PT LONG TERM  GOAL #4   Title Improved hip and knee strength to at least 4-/5 for greater ease with rising from a chair and  with standing > 10 min    Time 8   Period Weeks   Status On-going     PT LONG TERM GOAL #5   Title The patient will be able to ambulate 400 feet in six minutes needed for shorter distance community ambulation with appropriate assistive device   Status Achieved     PT LONG TERM GOAL #6   Title BERG balance score improved to 37/56 indicating improved safety with walking and standing dynamic activities   Time 8   Period Weeks   Status On-going     PT LONG TERM GOAL #7   Title Able to walk 650 feet in 6 minutes needed for travelling    Time 8   Period Weeks   Status On-going     PT LONG TERM GOAL #8   Title Timed up and go test improved to 17 sec indicating improved agility with sit to stand and gait speed   Time 8   Status On-going               Plan - 03/19/16 0807    Clinical Impression Statement The patient has difficulty with head movements, even small increments, secondary to dizziness.  He continues to walk with a wide base of support, waddling gait.  He reports his right leg pain is still present but no worse following treatment session.  Therapist providing close supervision for safety, monitoring of dizziness, over-fatigue and pain.     PT Next Visit Plan balance with narrow base of support and dynamic movements;  LE strengthening;    gait endurance;  KX       Patient will benefit from skilled therapeutic intervention in order to improve the following deficits and impairments:     Visit Diagnosis: Muscle weakness (generalized)  Difficulty in walking, not elsewhere classified  Repeated falls  Chronic midline low back pain without sciatica  Arthralgia of right lower leg     Problem List Patient  Active Problem List   Diagnosis Date Noted  . Leg edema, right 02/20/2015  . BPH (benign prostatic hypertrophy) 12/09/2014  . Chronic radicular low  back pain 08/06/2012  . COPD (chronic obstructive pulmonary disease) ([place]) 04/21/2012  . Hyponatremia 03/29/2012  . Localized osteoarthrosis not specified whether primary or secondary, lower leg 06/20/2010  . VARICOSE VEINS LOWER EXTREMITIES W/INFLAMMATION 02/12/2010  . [place] LOCALIZED OSTEOARTHROSIS INVOLVING HAND 10/16/2009  . DUPUYTREN'S CONTRACTURE, RIGHT 07/04/2009  . DEGENERATIVE DISC DISEASE, LUMBOSACRAL SPINE W/RADICULOPATHY 06/19/2009  . CARCINOMA, BASAL CELL 02/07/2009  . ANEMIA OF CHRONIC DISEASE 02/07/2009  . BURSITIS, RIGHT SHOULDER 10/07/2008  . CATARACT ASSOCIATED WITH OTHER SYNDROMES 08/01/2008  . DRY SKIN 08/01/2008  . CERUMEN IMPACTION, BILATERAL 03/01/2008  . CRAMP IN LIMB 01/18/2008  . DEPRESSION, SITUATIONAL, ACUTE 12/28/2007  . CONSTIPATION, DRUG INDUCED 12/28/2007  . POLYNEUROPATHY OTHER DISEASES CLASSIFIED ELSW 09/24/2007  . WEIGHT LOSS, ABNORMAL 01/07/2007  . Hyperlipidemia 09/22/2006  . Essential hypertension 09/22/2006  . GERD 09/22/2006  . OSTEOARTHRITIS 09/22/2006   Ruben Im, PT 03/19/16 10:50 AM Phone: [phone] Fax: [phone]  Alvera Singh 03/19/2016, 10:50 AM  [facility] [address], [place], [place], [address] Phone: [phone]   Fax:  [phone]  Name: Kross Nicasio MRN: [id number] Date of Birth: 02/06/1932

## 2016-03-20 ENCOUNTER — Encounter: Payer: Self-pay | Admitting: Podiatry

## 2016-03-20 ENCOUNTER — Ambulatory Visit (INDEPENDENT_AMBULATORY_CARE_PROVIDER_SITE_OTHER): Payer: Medicare Other | Admitting: Podiatry

## 2016-03-20 VITALS — BP 152/67 | HR 83 | Resp 18

## 2016-03-20 DIAGNOSIS — B351 Tinea unguium: Secondary | ICD-10-CM | POA: Diagnosis not present

## 2016-03-20 DIAGNOSIS — M79676 Pain in unspecified toe(s): Secondary | ICD-10-CM | POA: Diagnosis not present

## 2016-03-21 NOTE — Progress Notes (Signed)
Patient ID: Darin [name], male   DOB: March 22, 1932, 82 y.o.   MRN: [id number]    Subjective: This patient presents for scheduled visit complaining of painful toenails when walking wearing shoes and requests nail debridement  Objective: Orientated 3 Bilateral peripheral pitting edema DP and PT pulses 2/4 bilaterally Capillary reflex immediate bilaterally Sensation to 10 g monofilament wire intact 3/5 r bilaterally Vibratory sensation nonreactive bilaterally Ankle reflexes reactive bilaterally Manual motor testing dorsi flexion, plantar flexion 5/5 bilaterally No open skin lesions noted bilaterally The toenails are incurvated, hypertrophic, discolored and tender to direct palpation 6-10  Assessment: Symptomatic onychomycoses 6-10 Peripheral neuropathy  Plan: Debridement toenails 10 and mechanically and electrically without any bleeding  Reappoint 3 months

## 2016-03-22 ENCOUNTER — Ambulatory Visit: Payer: Medicare Other | Admitting: Physical Therapy

## 2016-03-22 DIAGNOSIS — R296 Repeated falls: Secondary | ICD-10-CM | POA: Diagnosis not present

## 2016-03-22 DIAGNOSIS — M545 Low back pain: Secondary | ICD-10-CM | POA: Diagnosis not present

## 2016-03-22 DIAGNOSIS — M25561 Pain in right knee: Secondary | ICD-10-CM | POA: Diagnosis not present

## 2016-03-22 DIAGNOSIS — M6281 Muscle weakness (generalized): Secondary | ICD-10-CM

## 2016-03-22 DIAGNOSIS — R262 Difficulty in walking, not elsewhere classified: Secondary | ICD-10-CM | POA: Diagnosis not present

## 2016-03-22 DIAGNOSIS — G8929 Other chronic pain: Secondary | ICD-10-CM | POA: Diagnosis not present

## 2016-03-22 NOTE — Therapy (Signed)
[facility] [address] [place], [place], [address] Phone: [phone]   Fax:  [phone]  Physical Therapy Treatment  Patient Details  Name: Darin [name] MRN: [id number] Date of Birth: 11-08-1931 Referring Provider: Dr. Elease Hashimoto  Encounter Date: 03/22/2016      PT End of Session - 03/22/16 0825    Visit Number 18   Number of Visits 20   Date for PT Re-Evaluation 05/03/16   Authorization Type Medicare g codes;  KX at visit 15   PT Start Time 0753   PT Stop Time 0837   PT Time Calculation (min) 44 min   Activity Tolerance Patient tolerated treatment well      Past Medical History:  Diagnosis Date  . Arthritis   . GERD (gastroesophageal reflux disease)   . H/O hiatal hernia    "FOR YEARS" " NO PROBLEM"  . Hyperlipidemia   . Hypertension   . Knee pain   . Neuropathy (HCC)    PERIPHERAL   . Osteoporosis   . PMR (polymyalgia rheumatica) (HCC)   . Polymyalgia ([place]) 5 YEARS    Past Surgical History:  Procedure Laterality Date  . CARPAL TUNNEL RELEASE    . CATARACT EXTRACTION     right  . INGUINAL HERNIA REPAIR  10/14/2011   Procedure: HERNIA REPAIR INGUINAL ADULT;  Surgeon: [name], MD;  Location: [place];  Service: General;  Laterality: Right;  Right Inguinal Hernia Repair with Mesh  . TONSILLECTOMY    . VARICOSE VEIN SURGERY      There were no vitals filed for this visit.      Subjective Assessment - 03/22/16 0750    Subjective My calf muscles are sore.  During the night I thought I was getting another case of sciatica but it went away.     Pertinent History chronic lumbar DDD, polyneuropathy;  HTN   Currently in Pain? Yes   Pain Score 6    Pain Location Calf   Pain Orientation Right   Pain Type Chronic pain                         [facility] Adult PT Treatment/Exercise - 03/22/16 0001      Lumbar Exercises: Machines for Strengthening   Leg Press Seat 8 75# 25x;   single leg 35# right/left 20x each     Knee/Hip Exercises: Stretches   Active Hamstring Stretch Right;Left;5 reps   Active Hamstring Stretch Limitations on step      Knee/Hip Exercises: Aerobic   Stationary Bike 6 min forward revolutions   Nustep L3 10 min seat 12     Knee/Hip Exercises: Standing   Knee Flexion AROM;Right;Left   Knee Flexion Limitations marching while holding 5# wt.   Hip Extension Right;Left;1 set;15 reps;Knee straight   Extension Limitations red band   Forward Step Up Right;Left;1 set;10 reps   SLS staggered foot stand weight shifting holding 5# weight 10x right./left   Other Standing Knee Exercises hold 5# wt in hand with step taps and 1 railing   Other Standing Knee Exercises hold 5# wt with sidestepping and walking forward/backward                  PT Short Term Goals - 03/22/16 0839      PT SHORT TERM GOAL #1   Title be independent in initial HEP  02/13/16   Status Achieved     PT SHORT TERM  GOAL #2   Title perform TUG in < or = to 20 seconds to improve balance   Status Achieved     PT SHORT TERM GOAL #3   Title The patient will have improved hip flexor muscle length to 5 degrees and HS length to 55 degrees needed for improved stride length with ambulation   Status Achieved     PT SHORT TERM GOAL #4   Title BERG balance test improved from 29/56 to 33/56 indicating improved safety for standing ADLs like grooming and dresssing   Status Achieved           PT Long Term Goals - 03/22/16 0839      PT LONG TERM GOAL #1   Title be independent in advanced HEP for further improvements in strength, ROM and balance  03/12/16   Time 8   Period Weeks   Status On-going     PT LONG TERM GOAL #2   Title reduce FOTO to < or = to 46% limitation indicating improved function with ADLs   Time 8   Period Weeks     PT LONG TERM GOAL #3   Title perform TUG in < or = to 18 seconds to reduce risk of falls   Time 8   Period Weeks     PT LONG TERM  GOAL #4   Title Improved hip and knee strength to at least 4-/5 for greater ease with rising from a chair and  with standing > 10 min    Time 8   Period Weeks   Status On-going     PT LONG TERM GOAL #5   Title The patient will be able to ambulate 400 feet in six minutes needed for shorter distance community ambulation with appropriate assistive device   Status Achieved     PT LONG TERM GOAL #6   Title BERG balance score improved to 37/56 indicating improved safety with walking and standing dynamic activities   Time 8   Period Weeks   Status On-going     PT LONG TERM GOAL #7   Title Able to walk 650 feet in 6 minutes needed for travelling    Time 8   Period Weeks   Status On-going     PT LONG TERM GOAL #8   Title Timed up and go test improved to 17 sec indicating improved agility with sit to stand and gait speed   Time 8   Period Weeks   Status On-going               Plan - 03/22/16 0826    Clinical Impression Statement The patient is able to carry a 5# weight with narrow base of support, staggered standing and stepping dynamic balance exercises but needs 1 hand on railing for support.  Distal and proximal LE weakness contributes to waddling gait with wide base of support and increased fall risk.  He needs short breaks between exercises secondary to reports of "wooziness".  Therapist closely monitoring response and providing close supervision for safety.     PT Next Visit Plan balance with narrow base of support and dynamic movements;  LE strengthening;    gait endurance;  KX       Patient will benefit from skilled therapeutic intervention in order to improve the following deficits and impairments:     Visit Diagnosis: Muscle weakness (generalized)  Difficulty in walking, not elsewhere classified  Repeated falls     Problem List Patient Active Problem List  Diagnosis Date Noted  . Leg edema, right 02/20/2015  . BPH (benign prostatic hypertrophy) 12/09/2014   . Chronic radicular low back pain 08/06/2012  . COPD (chronic obstructive pulmonary disease) ([place]) 04/21/2012  . Hyponatremia 03/29/2012  . Localized osteoarthrosis not specified whether primary or secondary, lower leg 06/20/2010  . VARICOSE VEINS LOWER EXTREMITIES W/INFLAMMATION 02/12/2010  . [place] LOCALIZED OSTEOARTHROSIS INVOLVING HAND 10/16/2009  . DUPUYTREN'S CONTRACTURE, RIGHT 07/04/2009  . DEGENERATIVE DISC DISEASE, LUMBOSACRAL SPINE W/RADICULOPATHY 06/19/2009  . CARCINOMA, BASAL CELL 02/07/2009  . ANEMIA OF CHRONIC DISEASE 02/07/2009  . BURSITIS, RIGHT SHOULDER 10/07/2008  . CATARACT ASSOCIATED WITH OTHER SYNDROMES 08/01/2008  . DRY SKIN 08/01/2008  . CERUMEN IMPACTION, BILATERAL 03/01/2008  . CRAMP IN LIMB 01/18/2008  . DEPRESSION, SITUATIONAL, ACUTE 12/28/2007  . CONSTIPATION, DRUG INDUCED 12/28/2007  . POLYNEUROPATHY OTHER DISEASES CLASSIFIED ELSW 09/24/2007  . WEIGHT LOSS, ABNORMAL 01/07/2007  . Hyperlipidemia 09/22/2006  . Essential hypertension 09/22/2006  . GERD 09/22/2006  . OSTEOARTHRITIS 09/22/2006   Ruben Im, PT 03/22/16 9:05 AM Phone: [phone] Fax: [phone]  Alvera Singh 03/22/2016, 9:04 AM  [facility] [address] [place], [place], [address] Phone: [phone]   Fax:  [phone]  Name: Darin [name] MRN: [id number] Date of Birth: [birth year]

## 2016-03-26 ENCOUNTER — Ambulatory Visit: Payer: Medicare Other | Attending: Family Medicine | Admitting: Physical Therapy

## 2016-03-26 ENCOUNTER — Encounter: Payer: Self-pay | Admitting: Physical Therapy

## 2016-03-26 DIAGNOSIS — M6281 Muscle weakness (generalized): Secondary | ICD-10-CM | POA: Diagnosis not present

## 2016-03-26 DIAGNOSIS — M25561 Pain in right knee: Secondary | ICD-10-CM | POA: Diagnosis not present

## 2016-03-26 DIAGNOSIS — R296 Repeated falls: Secondary | ICD-10-CM | POA: Diagnosis not present

## 2016-03-26 DIAGNOSIS — R262 Difficulty in walking, not elsewhere classified: Secondary | ICD-10-CM | POA: Insufficient documentation

## 2016-03-26 DIAGNOSIS — G8929 Other chronic pain: Secondary | ICD-10-CM | POA: Insufficient documentation

## 2016-03-26 DIAGNOSIS — M545 Low back pain: Secondary | ICD-10-CM | POA: Insufficient documentation

## 2016-03-26 NOTE — Therapy (Signed)
[facility] [address], [place], [place], [address] Phone: [phone]   Fax:  [phone]  Physical Therapy Treatment  Patient Details  Name: Darin [name] MRN: [id number] Date of Birth: 07-19-1931 Referring Provider: Dr. Elease Hashimoto  Encounter Date: 03/26/2016      PT End of Session - 03/26/16 0807    Visit Number 19   Number of Visits 20   Date for PT Re-Evaluation 05/03/16   Authorization Type Medicare g codes;  KX at visit 15   PT Start Time 0801   PT Stop Time 0846   PT Time Calculation (min) 45 min   Activity Tolerance Patient tolerated treatment well   Behavior During Therapy [facility] for tasks assessed/performed      Past Medical History:  Diagnosis Date  . Arthritis   . GERD (gastroesophageal reflux disease)   . H/O hiatal hernia    "FOR YEARS" " NO PROBLEM"  . Hyperlipidemia   . Hypertension   . Knee pain   . Neuropathy (HCC)    PERIPHERAL   . Osteoporosis   . PMR (polymyalgia rheumatica) (HCC)   . Polymyalgia ([place]) 5 YEARS    Past Surgical History:  Procedure Laterality Date  . CARPAL TUNNEL RELEASE    . CATARACT EXTRACTION     right  . INGUINAL HERNIA REPAIR  10/14/2011   Procedure: HERNIA REPAIR INGUINAL ADULT;  Surgeon: [name], MD;  Location: [place];  Service: General;  Laterality: Right;  Right Inguinal Hernia Repair with Mesh  . TONSILLECTOMY    . VARICOSE VEIN SURGERY      There were no vitals filed for this visit.      Subjective Assessment - 03/26/16 0805    Subjective Rt eg been giving me fits feeling sore the past few days. Rt knee bothering me.   Pertinent History chronic lumbar DDD, polyneuropathy;  HTN   Limitations Walking;House hold activities;Standing   How long can you sit comfortably? as long as  I want but trouble rising following    How long can you stand comfortably? 5 min   How long can you walk comfortably? 10 min   Diagnostic tests none recently   Patient Stated Goals walk normally;  maintain my balance   Currently in Pain? Yes   Pain Score 7    Pain Location Calf   Pain Orientation Right   Pain Descriptors / Indicators Sore   Pain Type Chronic pain   Multiple Pain Sites No                         [facility] Adult PT Treatment/Exercise - 03/26/16 0001      Lumbar Exercises: Machines for Strengthening   Leg Press Seat 8 75# 25x;  single leg 35# right/left 20x each     Knee/Hip Exercises: Stretches   Active Hamstring Stretch Right;Left;5 reps   Active Hamstring Stretch Limitations on step    Other Knee/Hip Stretches stair hip flexor stretch 5x right/left     Knee/Hip Exercises: Aerobic   Stationary Bike 6 min forward revolutions   Nustep L3 10 min seat 12     Knee/Hip Exercises: Standing   Knee Flexion AROM;Right;Left   Knee Flexion Limitations marching while holding 5# wt.   Forward Step Up Right;Left;1 set;10 reps   SLS staggered foot stand weight shifting holding 5# weight 10x right./left   Other Standing Knee Exercises hold 5# wt in hand with step taps  and 1 railing   Other Standing Knee Exercises hold 5# wt with sidestepping and walking forward/backward     Manual Therapy   Manual Therapy Taping;Soft tissue mobilization   Manual therapy comments Stability tape to Rt knee   Soft tissue mobilization Rt quad IASTM  to increase mayofascial glide                  PT Short Term Goals - 03/22/16 0839      PT SHORT TERM GOAL #1   Title be independent in initial HEP  02/13/16   Status Achieved     PT SHORT TERM GOAL #2   Title perform TUG in < or = to 20 seconds to improve balance   Status Achieved     PT SHORT TERM GOAL #3   Title The patient will have improved hip flexor muscle length to 5 degrees and HS length to 55 degrees needed for improved stride length with ambulation   Status Achieved     PT SHORT TERM GOAL #4   Title BERG balance test improved from 29/56 to 33/56 indicating  improved safety for standing ADLs like grooming and dresssing   Status Achieved           PT Long Term Goals - 03/26/16 [id number]      PT LONG TERM GOAL #1   Title be independent in advanced HEP for further improvements in strength, ROM and balance  03/12/16   Time 8   Period Weeks   Status On-going     PT LONG TERM GOAL #2   Title reduce FOTO to < or = to 46% limitation indicating improved function with ADLs   Time 8   Period Weeks   Status On-going     PT LONG TERM GOAL #6   Title BERG balance score improved to 37/56 indicating improved safety with walking and standing dynamic activities   Time 8   Period Weeks   Status On-going     PT LONG TERM GOAL #7   Title Able to walk 650 feet in 6 minutes needed for travelling    Time 8   Period Weeks   Status On-going     PT LONG TERM GOAL #8   Title Timed up and go test improved to 17 sec indicating improved agility with sit to stand and gait speed   Time 8   Period Weeks   Status On-going               Plan - 03/26/16 0841    Clinical Impression Statement Pt continues to progress with balance and strength. Continues to have Rt LE pain. Manual IASTM to Rt quad with some tenderness along distal quad and IT band. Pt will continue to benefti from skilled therapy for LE strength and flexibility.    Rehab Potential Good   PT Frequency 2x / week   PT Duration 8 weeks   PT Treatment/Interventions ADLs/Self Care Home Management;Moist Heat;Electrical Stimulation;Therapeutic exercise;Neuromuscular re-education;Patient/family education;Manual techniques   PT Next Visit Plan Asses responce to tapeing to Rt knee, balance and LE strength, KX   Consulted and Agree with Plan of Care Patient      Patient will benefit from skilled therapeutic intervention in order to improve the following deficits and impairments:  Pain, Abnormal gait, Decreased balance, Decreased activity tolerance, Decreased range of motion, Decreased strength,  Decreased mobility, Impaired flexibility, Difficulty walking  Visit Diagnosis: Muscle weakness (generalized)  Difficulty in walking, not elsewhere classified  Repeated falls     Problem List Patient Active Problem List   Diagnosis Date Noted  . Leg edema, right 02/20/2015  . BPH (benign prostatic hypertrophy) 12/09/2014  . Chronic radicular low back pain 08/06/2012  . COPD (chronic obstructive pulmonary disease) ([place]) 04/21/2012  . Hyponatremia 03/29/2012  . Localized osteoarthrosis not specified whether primary or secondary, lower leg 06/20/2010  . VARICOSE VEINS LOWER EXTREMITIES W/INFLAMMATION 02/12/2010  . [place] LOCALIZED OSTEOARTHROSIS INVOLVING HAND 10/16/2009  . DUPUYTREN'S CONTRACTURE, RIGHT 07/04/2009  . DEGENERATIVE DISC DISEASE, LUMBOSACRAL SPINE W/RADICULOPATHY 06/19/2009  . CARCINOMA, BASAL CELL 02/07/2009  . ANEMIA OF CHRONIC DISEASE 02/07/2009  . BURSITIS, RIGHT SHOULDER 10/07/2008  . CATARACT ASSOCIATED WITH OTHER SYNDROMES 08/01/2008  . DRY SKIN 08/01/2008  . CERUMEN IMPACTION, BILATERAL 03/01/2008  . CRAMP IN LIMB 01/18/2008  . DEPRESSION, SITUATIONAL, ACUTE 12/28/2007  . CONSTIPATION, DRUG INDUCED 12/28/2007  . POLYNEUROPATHY OTHER DISEASES CLASSIFIED ELSW 09/24/2007  . WEIGHT LOSS, ABNORMAL 01/07/2007  . Hyperlipidemia 09/22/2006  . Essential hypertension 09/22/2006  . GERD 09/22/2006  . OSTEOARTHRITIS 09/22/2006    Mikle Bosworth PTA 03/26/2016, 8:44 AM  [facility] [address] [place], [place], [address] Phone: [phone]   Fax:  [phone]  Name: Sarath Sullinger MRN: [id number] Date of Birth: 1932/02/03

## 2016-03-29 ENCOUNTER — Ambulatory Visit: Payer: Medicare Other | Admitting: Physical Therapy

## 2016-03-29 ENCOUNTER — Encounter: Payer: Self-pay | Admitting: Physical Therapy

## 2016-03-29 DIAGNOSIS — M545 Low back pain: Secondary | ICD-10-CM | POA: Diagnosis not present

## 2016-03-29 DIAGNOSIS — R262 Difficulty in walking, not elsewhere classified: Secondary | ICD-10-CM

## 2016-03-29 DIAGNOSIS — G8929 Other chronic pain: Secondary | ICD-10-CM | POA: Diagnosis not present

## 2016-03-29 DIAGNOSIS — M25561 Pain in right knee: Secondary | ICD-10-CM | POA: Diagnosis not present

## 2016-03-29 DIAGNOSIS — M6281 Muscle weakness (generalized): Secondary | ICD-10-CM | POA: Diagnosis not present

## 2016-03-29 DIAGNOSIS — R296 Repeated falls: Secondary | ICD-10-CM | POA: Diagnosis not present

## 2016-03-29 NOTE — Therapy (Signed)
[facility] [address], [place], [place], [address] Phone: [phone]   Fax:  [phone]  Physical Therapy Treatment  Patient Details  Name: Darin [name] MRN: [id number] Date of Birth: [birth year] Referring Provider: Dr. Elease Hashimoto  Encounter Date: 03/29/2016      PT End of Session - 03/29/16 0800    Visit Number 20   Number of Visits 20   Date for PT Re-Evaluation 05/03/16   Authorization Type Medicare g codes;  KX at visit 15   PT Start Time 0757   PT Stop Time 0845   PT Time Calculation (min) 48 min   Activity Tolerance Patient tolerated treatment well   Behavior During Therapy [facility] for tasks assessed/performed      Past Medical History:  Diagnosis Date  . Arthritis   . GERD (gastroesophageal reflux disease)   . H/O hiatal hernia    "FOR YEARS" " NO PROBLEM"  . Hyperlipidemia   . Hypertension   . Knee pain   . Neuropathy (HCC)    PERIPHERAL   . Osteoporosis   . PMR (polymyalgia rheumatica) (HCC)   . Polymyalgia ([place]) 5 YEARS    Past Surgical History:  Procedure Laterality Date  . CARPAL TUNNEL RELEASE    . CATARACT EXTRACTION     right  . INGUINAL HERNIA REPAIR  10/14/2011   Procedure: HERNIA REPAIR INGUINAL ADULT;  Surgeon: [name], MD;  Location: [place];  Service: General;  Laterality: Right;  Right Inguinal Hernia Repair with Mesh  . TONSILLECTOMY    . VARICOSE VEIN SURGERY      There were no vitals filed for this visit.      Subjective Assessment - 03/29/16 0801    Subjective Rt knee been giving me hell through the night. Legs feeling the same.    Pertinent History chronic lumbar DDD, polyneuropathy;  HTN   Limitations Walking;House hold activities;Standing   How long can you sit comfortably? as long as  I want but trouble rising following    How long can you stand comfortably? 5 min   How long can you walk comfortably? 10 min   Diagnostic tests none recently   Patient  Stated Goals walk normally;  maintain my balance   Currently in Pain? Yes   Pain Score 4    Pain Location Leg   Pain Orientation Right;Left   Pain Descriptors / Indicators Sore   Pain Type Chronic pain                         [facility] Adult PT Treatment/Exercise - 03/29/16 0001      Lumbar Exercises: Seated   Sit to Stand --  3x10   Sit to Stand Limitations high/low table     Knee/Hip Exercises: Stretches   Active Hamstring Stretch Right;Left;5 reps   Active Hamstring Stretch Limitations on step    Gastroc Stretch Both;10 seconds  with neck extension   Other Knee/Hip Stretches stair hip flexor stretch 5x right/left     Knee/Hip Exercises: Aerobic   Stationary Bike 6 minutes L1   Nustep L3 10 min seat 12     Knee/Hip Exercises: Standing   Hip Extension 2 sets;10 reps  With alt arm movements   Forward Step Up Right;Left;1 set;10 reps;Hand Hold: 1  Lt 8" box; Rt 6" box   SLS staggered foot stand weight shifting holding 5# weight 10x right./left   Other Standing Knee Exercises hold  5# wt in hand with step taps and 1 railing   Other Standing Knee Exercises hold 5# wt with sidestepping and walking forward/backward                  PT Short Term Goals - 03/29/16 0800      PT SHORT TERM GOAL #1   Title be independent in initial HEP  02/13/16   Time 4   Period Weeks   Status Achieved           PT Long Term Goals - 03/29/16 0800      PT LONG TERM GOAL #1   Title be independent in advanced HEP for further improvements in strength, ROM and balance  03/12/16   Time 8   Period Weeks   Status On-going     PT LONG TERM GOAL #2   Title reduce FOTO to < or = to 46% limitation indicating improved function with ADLs   Time 8   Period Weeks   Status On-going     PT LONG TERM GOAL #3   Title perform TUG in < or = to 18 seconds to reduce risk of falls   Time 8   Period Weeks   Status On-going     PT LONG TERM GOAL #4   Title Improved hip and  knee strength to at least 4-/5 for greater ease with rising from a chair and  with standing > 10 min    Time 8   Period Weeks   Status On-going     PT LONG TERM GOAL #5   Title The patient will be able to ambulate 400 feet in six minutes needed for shorter distance community ambulation with appropriate assistive device   Baseline 480   Time 8   Period Weeks   Status Achieved     PT LONG TERM GOAL #6   Title BERG balance score improved to 37/56 indicating improved safety with walking and standing dynamic activities               Plan - 03/29/16 0839    Clinical Impression Statement Pt progressing slowly with LE strength. Able to perform sit to stands from lower begining height. Pt walked 480 feet with 6 minute walk test which is one lap less than base line. Pt hip ROM continues to improve. Pt posture continues to affect Rt LE strength, Rt hip ROM, and causing Rt knee pain. Pt will continue to benefit from skilled therapy for LE strength and postural training.    Rehab Potential Good   PT Frequency 2x / week   PT Duration 8 weeks   PT Treatment/Interventions ADLs/Self Care Home Management;Moist Heat;Electrical Stimulation;Therapeutic exercise;Neuromuscular re-education;Patient/family education;Manual techniques   PT Next Visit Plan Retest 6 minute walk, Postural training, Bil LE strength      Patient will benefit from skilled therapeutic intervention in order to improve the following deficits and impairments:  Pain, Abnormal gait, Decreased balance, Decreased activity tolerance, Decreased range of motion, Decreased strength, Decreased mobility, Impaired flexibility, Difficulty walking  Visit Diagnosis: Muscle weakness (generalized)  Difficulty in walking, not elsewhere classified     Problem List Patient Active Problem List   Diagnosis Date Noted  . Leg edema, right 02/20/2015  . BPH (benign prostatic hypertrophy) 12/09/2014  . Chronic radicular low back pain 08/06/2012   . COPD (chronic obstructive pulmonary disease) ([place]) 04/21/2012  . Hyponatremia 03/29/2012  . Localized osteoarthrosis not specified whether primary or secondary, lower leg 06/20/2010  .  VARICOSE VEINS LOWER EXTREMITIES W/INFLAMMATION 02/12/2010  . [place] LOCALIZED OSTEOARTHROSIS INVOLVING HAND 10/16/2009  . DUPUYTREN'S CONTRACTURE, RIGHT 07/04/2009  . DEGENERATIVE DISC DISEASE, LUMBOSACRAL SPINE W/RADICULOPATHY 06/19/2009  . CARCINOMA, BASAL CELL 02/07/2009  . ANEMIA OF CHRONIC DISEASE 02/07/2009  . BURSITIS, RIGHT SHOULDER 10/07/2008  . CATARACT ASSOCIATED WITH OTHER SYNDROMES 08/01/2008  . DRY SKIN 08/01/2008  . CERUMEN IMPACTION, BILATERAL 03/01/2008  . CRAMP IN LIMB 01/18/2008  . DEPRESSION, SITUATIONAL, ACUTE 12/28/2007  . CONSTIPATION, DRUG INDUCED 12/28/2007  . POLYNEUROPATHY OTHER DISEASES CLASSIFIED ELSW 09/24/2007  . WEIGHT LOSS, ABNORMAL 01/07/2007  . Hyperlipidemia 09/22/2006  . Essential hypertension 09/22/2006  . GERD 09/22/2006  . OSTEOARTHRITIS 09/22/2006    Mikle Bosworth PTA 03/29/2016, 8:52 AM  [facility] [address] [place], [place], [address] Phone: [phone]   Fax:  [phone]  Name: Darin [name] MRN: [id number] Date of Birth: Feb 11, 1932

## 2016-04-02 ENCOUNTER — Ambulatory Visit: Payer: Medicare Other

## 2016-04-02 DIAGNOSIS — R262 Difficulty in walking, not elsewhere classified: Secondary | ICD-10-CM | POA: Diagnosis not present

## 2016-04-02 DIAGNOSIS — M6281 Muscle weakness (generalized): Secondary | ICD-10-CM

## 2016-04-02 DIAGNOSIS — M25561 Pain in right knee: Secondary | ICD-10-CM | POA: Diagnosis not present

## 2016-04-02 DIAGNOSIS — G8929 Other chronic pain: Secondary | ICD-10-CM | POA: Diagnosis not present

## 2016-04-02 DIAGNOSIS — R296 Repeated falls: Secondary | ICD-10-CM | POA: Diagnosis not present

## 2016-04-02 DIAGNOSIS — M545 Low back pain: Secondary | ICD-10-CM | POA: Diagnosis not present

## 2016-04-02 NOTE — Therapy (Signed)
[facility] [address], [place], [place], [address] Phone: [phone]   Fax:  [phone]  Physical Therapy Treatment  Patient Details  Name: Darin [name] MRN: [id number] Date of Birth: 1932-01-03 Referring Provider: Dr. Elease Hashimoto  Encounter Date: 04/02/2016      PT End of Session - 04/02/16 0838    Visit Number 21   Number of Visits 30   Date for PT Re-Evaluation May 27, 2016   Authorization Type G-codes at 30   PT Start Time 0757   PT Stop Time 0844   PT Time Calculation (min) 47 min   Activity Tolerance Patient tolerated treatment well   Behavior During Therapy [facility] for tasks assessed/performed      Past Medical History:  Diagnosis Date  . Arthritis   . GERD (gastroesophageal reflux disease)   . H/O hiatal hernia    "FOR YEARS" " NO PROBLEM"  . Hyperlipidemia   . Hypertension   . Knee pain   . Neuropathy (HCC)    PERIPHERAL   . Osteoporosis   . PMR (polymyalgia rheumatica) (HCC)   . Polymyalgia ([place]) 5 YEARS    Past Surgical History:  Procedure Laterality Date  . CARPAL TUNNEL RELEASE    . CATARACT EXTRACTION     right  . INGUINAL HERNIA REPAIR  10/14/2011   Procedure: HERNIA REPAIR INGUINAL ADULT;  Surgeon: [name], MD;  Location: [place];  Service: General;  Laterality: Right;  Right Inguinal Hernia Repair with Mesh  . TONSILLECTOMY    . VARICOSE VEIN SURGERY      There were no vitals filed for this visit.          [facility] PT Assessment - 04/02/16 0001      Assessment   Medical Diagnosis LBP; unsteady; gait abnormality     Strength   Right Hip Flexion 4/5   Left Hip Flexion 4/5   Right Knee Flexion 4/5   Right Knee Extension 4/5   Left Knee Flexion 4/5   Left Knee Extension 4/5                     [facility] Adult PT Treatment/Exercise - 04/02/16 0001      Lumbar Exercises: Stretches   Active Hamstring Stretch 3 reps;20 seconds     Lumbar Exercises: Machines  for Strengthening   Leg Press Seat 8 75# 40x;  single leg 35# right/left 40x each     Lumbar Exercises: Seated   Sit to Stand --  3x10   Sit to Stand Limitations high/low table     Knee/Hip Exercises: Aerobic   Stationary Bike 6 minutes L1   Nustep L3 10 min seat 12     Knee/Hip Exercises: Standing   Hip Extension 2 sets;10 reps  With alt arm movements   Forward Step Up Right;Left;10 reps;Hand Hold: 1;2 sets  Lt 8" box; Rt 6" box                  PT Short Term Goals - 03/29/16 0800      PT SHORT TERM GOAL #1   Title be independent in initial HEP  02/13/16   Time 4   Period Weeks   Status Achieved           PT Long Term Goals - 04/02/16 [id number]      PT LONG TERM GOAL #1   Title be independent in advanced HEP for further improvements in strength, ROM and balance  03/12/16   Time 8   Period Weeks   Status On-going     PT LONG TERM GOAL #2   Title reduce FOTO to < or = to 46% limitation indicating improved function with ADLs   Time 8   Period Weeks   Status On-going     PT LONG TERM GOAL #4   Title Improved hip and knee strength to at least 4-/5 for greater ease with rising from a chair and  with standing > 10 min    Time 8   Period Weeks   Status On-going             Patient will benefit from skilled therapeutic intervention in order to improve the following deficits and impairments:     Visit Diagnosis: Muscle weakness (generalized)  Difficulty in walking, not elsewhere classified     Problem List Patient Active Problem List   Diagnosis Date Noted  . Leg edema, right 02/20/2015  . BPH (benign prostatic hypertrophy) 12/09/2014  . Chronic radicular low back pain 08/06/2012  . COPD (chronic obstructive pulmonary disease) ([place]) 04/21/2012  . Hyponatremia 03/29/2012  . Localized osteoarthrosis not specified whether primary or secondary, lower leg 06/20/2010  . VARICOSE VEINS LOWER EXTREMITIES W/INFLAMMATION 02/12/2010  . [place] LOCALIZED  OSTEOARTHROSIS INVOLVING HAND 10/16/2009  . DUPUYTREN'S CONTRACTURE, RIGHT 07/04/2009  . DEGENERATIVE DISC DISEASE, LUMBOSACRAL SPINE W/RADICULOPATHY 06/19/2009  . CARCINOMA, BASAL CELL 02/07/2009  . ANEMIA OF CHRONIC DISEASE 02/07/2009  . BURSITIS, RIGHT SHOULDER 10/07/2008  . CATARACT ASSOCIATED WITH OTHER SYNDROMES 08/01/2008  . DRY SKIN 08/01/2008  . CERUMEN IMPACTION, BILATERAL 03/01/2008  . CRAMP IN LIMB 01/18/2008  . DEPRESSION, SITUATIONAL, ACUTE 12/28/2007  . CONSTIPATION, DRUG INDUCED 12/28/2007  . POLYNEUROPATHY OTHER DISEASES CLASSIFIED ELSW 09/24/2007  . WEIGHT LOSS, ABNORMAL 01/07/2007  . Hyperlipidemia 09/22/2006  . Essential hypertension 09/22/2006  . GERD 09/22/2006  . OSTEOARTHRITIS 09/22/2006    Sigurd Sos, PT 04/02/16 8:40 AM  [facility] [address], [place], [place], [address] Phone: [phone]   Fax:  [phone]  Name: Errick Brodt MRN: [id number] Date of Birth: 1931-05-13

## 2016-04-04 ENCOUNTER — Telehealth: Payer: Self-pay | Admitting: Family Medicine

## 2016-04-04 NOTE — Telephone Encounter (Signed)
Okay to fill the cipro 500mg ?  Medication is not currently on medication list.

## 2016-04-04 NOTE — Telephone Encounter (Signed)
Pt need new for CIPRO 500 MG the one he currently has expired and need one for traveling.  Pharm:  [facility]

## 2016-04-04 NOTE — Telephone Encounter (Signed)
Refill Cipro 500 mg one po bid for 3 days prn travelers diarrhea  Dispense #12

## 2016-04-05 ENCOUNTER — Encounter: Payer: Self-pay | Admitting: Physical Therapy

## 2016-04-05 ENCOUNTER — Ambulatory Visit: Payer: Medicare Other | Admitting: Physical Therapy

## 2016-04-05 DIAGNOSIS — G8929 Other chronic pain: Secondary | ICD-10-CM | POA: Diagnosis not present

## 2016-04-05 DIAGNOSIS — M25561 Pain in right knee: Secondary | ICD-10-CM | POA: Diagnosis not present

## 2016-04-05 DIAGNOSIS — M545 Low back pain, unspecified: Secondary | ICD-10-CM

## 2016-04-05 DIAGNOSIS — R296 Repeated falls: Secondary | ICD-10-CM

## 2016-04-05 DIAGNOSIS — M6281 Muscle weakness (generalized): Secondary | ICD-10-CM

## 2016-04-05 DIAGNOSIS — R262 Difficulty in walking, not elsewhere classified: Secondary | ICD-10-CM

## 2016-04-05 NOTE — Patient Instructions (Signed)
Abdominals: Single Leg Bend    Lying on back with legs out straight, inhale, then exhale while slowly sliding heel along floor toward buttocks. Slowly return to starting position. Repeat ____ times each leg per set. Do ____ sets per session. Do ____ sessions per day.  Copyright  VHI. All rights reserved.   Bridging    Slowly raise buttocks from floor, keeping stomach tight. Repeat ____ times per set. Do ____ sets per session. Do ____ sessions per day.  http://orth.exer.us/1096   Copyright  VHI. All rights reserved.  Chin Protraction / Retraction    Slide head forward keeping chin level. Slide head back, pulling chin in. Hold each position __3_ seconds. Repeat _10__ times. Do _1__ sessions per day.  Copyright  VHI. All rights reserved.   Thoracic Self-Mobilization (Sitting)    With small rolled towel at lower ribs level, gently lean back until stretch is felt. Hold ____ seconds. Relax. Repeat ____ times per set. Do ____ sets per session. Do ____ sessions per day.  http://orth.exer.us/998   Copyright  VHI. All rights reserved.   Mikle Bosworth, PTA 04/05/16 10:06 AM  [facility] [address] [place], [place] [address] Phone # [phone] Fax [phone]

## 2016-04-05 NOTE — Therapy (Signed)
[facility] [address] [place], [place], [address] Phone: [phone]   Fax:  [phone]  Physical Therapy Treatment  Patient Details  Name: Darin [name] MRN: [id number] Date of Birth: Jun 26, 1931 Referring Provider: Dr. Elease Hashimoto  Encounter Date: 04/05/2016      PT End of Session - 04/05/16 0936    Visit Number 22   Number of Visits 30   Date for PT Re-Evaluation 05/21/2016   Authorization Type G-codes at 30   PT Start Time 0932   PT Stop Time 1013   PT Time Calculation (min) 41 min   Activity Tolerance Patient tolerated treatment well   Behavior During Therapy [facility] for tasks assessed/performed      Past Medical History:  Diagnosis Date  . Arthritis   . GERD (gastroesophageal reflux disease)   . H/O hiatal hernia    "FOR YEARS" " NO PROBLEM"  . Hyperlipidemia   . Hypertension   . Knee pain   . Neuropathy (HCC)    PERIPHERAL   . Osteoporosis   . PMR (polymyalgia rheumatica) (HCC)   . Polymyalgia ([place]) 5 YEARS    Past Surgical History:  Procedure Laterality Date  . CARPAL TUNNEL RELEASE    . CATARACT EXTRACTION     right  . INGUINAL HERNIA REPAIR  10/14/2011   Procedure: HERNIA REPAIR INGUINAL ADULT;  Surgeon: [name], MD;  Location: [place];  Service: General;  Laterality: Right;  Right Inguinal Hernia Repair with Mesh  . TONSILLECTOMY    . VARICOSE VEIN SURGERY      There were no vitals filed for this visit.      Subjective Assessment - 04/05/16 0935    Subjective Pt reports Lt knee giving him hell last night this time. Rt knee bothered as well but better than left. Hips also hurting this morning.    Pertinent History chronic lumbar DDD, polyneuropathy;  HTN   Limitations Walking;House hold activities;Standing   How long can you sit comfortably? as long as  I want but trouble rising following    How long can you stand comfortably? 5 min   How long can you walk comfortably? 10 min    Diagnostic tests none recently   Patient Stated Goals walk normally;  maintain my balance   Currently in Pain? Yes   Pain Score 5    Pain Location Leg   Pain Orientation Right;Left   Pain Descriptors / Indicators Sore   Pain Type Chronic pain   Pain Radiating Towards Left leg   Pain Onset More than a month ago   Pain Frequency Constant                         [facility] Adult PT Treatment/Exercise - 04/05/16 0001      Lumbar Exercises: Stretches   Active Hamstring Stretch --   Prone Mid Back Stretch --  Seated thoracic extension and chest stretch     Lumbar Exercises: Standing   Row Strengthening;Power tower;20 reps   Shoulder Extension Strengthening;Power Tower;20 reps     Lumbar Exercises: Seated   Sit to Stand --   Sit to Stand Limitations --     Lumbar Exercises: Supine   Ab Set 20 reps  Red ball knees to chest   Bent Knee Raise 20 reps   Bridge 20 reps   Other Supine Lumbar Exercises Ball squeeze     Knee/Hip Exercises: Stretches   Active Hamstring  Stretch Right;Left;5 reps   Active Hamstring Stretch Limitations on step    Gastroc Stretch Both;10 seconds  with neck extension   Other Knee/Hip Stretches stair hip flexor stretch 5x right/left     Knee/Hip Exercises: Aerobic   Stationary Bike 5 minutes L1  While therapist reveiwing HEP   Nustep L3 10 min seat 12  Therapist present to discuss treatment                PT Education - 04/05/16 1018    Education provided Yes   Education Details Core strengthening, thoracic stretching   Person(s) Educated Patient   Methods Explanation;Demonstration;Handout   Comprehension Verbalized understanding          PT Short Term Goals - 03/29/16 0800      PT SHORT TERM GOAL #1   Title be independent in initial HEP  02/13/16   Time 4   Period Weeks   Status Achieved           PT Long Term Goals - 04/02/16 [id number]      PT LONG TERM GOAL #1   Title be independent in advanced HEP for  further improvements in strength, ROM and balance  03/12/16   Time 8   Period Weeks   Status On-going     PT LONG TERM GOAL #2   Title reduce FOTO to < or = to 46% limitation indicating improved function with ADLs   Time 8   Period Weeks   Status On-going     PT LONG TERM GOAL #4   Title Improved hip and knee strength to at least 4-/5 for greater ease with rising from a chair and  with standing > 10 min    Time 8   Period Weeks   Status On-going               Plan - 04/05/16 1025    Clinical Impression Statement Pt continues to present with very forward head and forward hip posture. Having increased hip and knee pain today. Pt able to tolerate all stretches and supine strengthening well. Pt communicated understanding of all home exerciess. Pt has decreased core strength and decreased motor control of Bil LE in supine. Pt will continue to benefit frm skilled therapy for core strength and postural training.    Rehab Potential Good   PT Frequency 2x / week   PT Duration 8 weeks   PT Treatment/Interventions ADLs/Self Care Home Management;Moist Heat;Electrical Stimulation;Therapeutic exercise;Neuromuscular re-education;Patient/family education;Manual techniques   PT Next Visit Plan Postural training, core strength and stability; 6 minute walk test   Consulted and Agree with Plan of Care Patient      Patient will benefit from skilled therapeutic intervention in order to improve the following deficits and impairments:  Pain, Abnormal gait, Decreased balance, Decreased activity tolerance, Decreased range of motion, Decreased strength, Decreased mobility, Impaired flexibility, Difficulty walking  Visit Diagnosis: Muscle weakness (generalized)  Difficulty in walking, not elsewhere classified  Repeated falls  Arthralgia of right lower leg  Chronic midline low back pain without sciatica     Problem List Patient Active Problem List   Diagnosis Date Noted  . Leg edema, right  02/20/2015  . BPH (benign prostatic hypertrophy) 12/09/2014  . Chronic radicular low back pain 08/06/2012  . COPD (chronic obstructive pulmonary disease) ([place]) 04/21/2012  . Hyponatremia 03/29/2012  . Localized osteoarthrosis not specified whether primary or secondary, lower leg 06/20/2010  . VARICOSE VEINS LOWER EXTREMITIES W/INFLAMMATION 02/12/2010  . [place]  LOCALIZED OSTEOARTHROSIS INVOLVING HAND 10/16/2009  . DUPUYTREN'S CONTRACTURE, RIGHT 07/04/2009  . DEGENERATIVE DISC DISEASE, LUMBOSACRAL SPINE W/RADICULOPATHY 06/19/2009  . CARCINOMA, BASAL CELL 02/07/2009  . ANEMIA OF CHRONIC DISEASE 02/07/2009  . BURSITIS, RIGHT SHOULDER 10/07/2008  . CATARACT ASSOCIATED WITH OTHER SYNDROMES 08/01/2008  . DRY SKIN 08/01/2008  . CERUMEN IMPACTION, BILATERAL 03/01/2008  . CRAMP IN LIMB 01/18/2008  . DEPRESSION, SITUATIONAL, ACUTE 12/28/2007  . CONSTIPATION, DRUG INDUCED 12/28/2007  . POLYNEUROPATHY OTHER DISEASES CLASSIFIED ELSW 09/24/2007  . WEIGHT LOSS, ABNORMAL 01/07/2007  . Hyperlipidemia 09/22/2006  . Essential hypertension 09/22/2006  . GERD 09/22/2006  . OSTEOARTHRITIS 09/22/2006    Mikle Bosworth PTA 04/05/2016, 10:29 AM  [facility] [address] [place], [place], [address] Phone: [phone]   Fax:  [phone]  Name: Darin [name] MRN: [id number] Date of Birth: 05/29/1931

## 2016-04-08 ENCOUNTER — Other Ambulatory Visit: Payer: Self-pay | Admitting: Emergency Medicine

## 2016-04-08 MED ORDER — CIPROFLOXACIN HCL 500 MG PO TABS: 500.0000 mg | ORAL_TABLET | Freq: Two times a day (BID) | ORAL | 0 refills | Status: DC

## 2016-04-08 NOTE — Telephone Encounter (Signed)
Medication sent to [facility].

## 2016-04-09 ENCOUNTER — Ambulatory Visit: Payer: Medicare Other | Admitting: Physical Therapy

## 2016-04-09 DIAGNOSIS — G8929 Other chronic pain: Secondary | ICD-10-CM | POA: Diagnosis not present

## 2016-04-09 DIAGNOSIS — M6281 Muscle weakness (generalized): Secondary | ICD-10-CM | POA: Diagnosis not present

## 2016-04-09 DIAGNOSIS — M25561 Pain in right knee: Secondary | ICD-10-CM

## 2016-04-09 DIAGNOSIS — R262 Difficulty in walking, not elsewhere classified: Secondary | ICD-10-CM | POA: Diagnosis not present

## 2016-04-09 DIAGNOSIS — R296 Repeated falls: Secondary | ICD-10-CM

## 2016-04-09 DIAGNOSIS — M545 Low back pain: Secondary | ICD-10-CM | POA: Diagnosis not present

## 2016-04-09 NOTE — Therapy (Signed)
[facility] [address] [place], [place], [address] Phone: [phone]   Fax:  [phone]  Physical Therapy Treatment  Patient Details  Name: Darin [name] MRN: [id number] Date of Birth: 12-19-1931 Referring Provider: Dr. Elease Hashimoto  Encounter Date: 04/09/2016      PT End of Session - 04/09/16 0758    Visit Number 23   Number of Visits 30   Date for PT Re-Evaluation 15-May-2016   Authorization Type G-codes at 27   PT Start Time 0750   PT Stop Time 0840   PT Time Calculation (min) 50 min   Activity Tolerance Patient tolerated treatment well      Past Medical History:  Diagnosis Date  . Arthritis   . GERD (gastroesophageal reflux disease)   . H/O hiatal hernia    "FOR YEARS" " NO PROBLEM"  . Hyperlipidemia   . Hypertension   . Knee pain   . Neuropathy (HCC)    PERIPHERAL   . Osteoporosis   . PMR (polymyalgia rheumatica) (HCC)   . Polymyalgia ([place]) 5 YEARS    Past Surgical History:  Procedure Laterality Date  . CARPAL TUNNEL RELEASE    . CATARACT EXTRACTION     right  . INGUINAL HERNIA REPAIR  10/14/2011   Procedure: HERNIA REPAIR INGUINAL ADULT;  Surgeon: [name], MD;  Location: [place];  Service: General;  Laterality: Right;  Right Inguinal Hernia Repair with Mesh  . TONSILLECTOMY    . VARICOSE VEIN SURGERY      There were no vitals filed for this visit.      Subjective Assessment - 04/09/16 0752    Subjective My knee has eased up on me.  Just normal aches and pains now.  Leaves for his trip 05-15-16 (will come for PT that morning and leave in the afternoon.)  I need to walk more to prepare for my trip but my legs bother me when I do.  My balance is getting better.   I have to stand for a minute or two upon rising.     Currently in Pain? Yes   Pain Score 4    Pain Location Leg   Pain Orientation Right;Left   Pain Type Chronic pain            [facility] PT Assessment - 04/09/16 0001       6 Minute Walk- Baseline   Perceived Rate of Exertion ([place]) 13- Somewhat hard     6 minute walk test results    Aerobic Endurance Distance Walked 760                     [facility] Adult PT Treatment/Exercise - 04/09/16 0001      Ambulation/Gait   Gait Comments wide stance, dizziness following 6 min walk test     Knee/Hip Exercises: Aerobic   Stationary Bike 5 minutes L1  While therapist reveiwing HEP   Nustep L3 10 min seat 11  Therapist present to discuss treatment     Knee/Hip Exercises: Standing   Hip Extension Right;Left;1 set;15 reps;Knee straight   Extension Limitations red band   Forward Step Up Right;Left;10 reps;Hand Hold: 1;Step Height: 6"   Other Standing Knee Exercises Stair step: hip flexor, HS and calf stretches 5x right/left   Other Standing Knee Exercises 6 inch step taps with light single arm touch 10x      Knee/Hip Exercises: Seated   Long Arc Quad Strengthening;Right;Left;1 set;20 reps  Long Arc Quad Limitations green band   Ball Squeeze 20x   Clamshell with TheraBand Green   Marching Strengthening;Right;Left;1 set;15 reps   Marching Limitations 20x green band                  PT Short Term Goals - 04/09/16 0758      PT SHORT TERM GOAL #1   Title be independent in initial HEP  02/13/16   Status Achieved     PT SHORT TERM GOAL #2   Title perform TUG in < or = to 20 seconds to improve balance   Status Achieved     PT SHORT TERM GOAL #3   Title The patient will have improved hip flexor muscle length to 5 degrees and HS length to 55 degrees needed for improved stride length with ambulation   Status Achieved     PT SHORT TERM GOAL #4   Title BERG balance test improved from 29/56 to 33/56 indicating improved safety for standing ADLs like grooming and dresssing   Status Achieved           PT Long Term Goals - 04/09/16 0759      PT LONG TERM GOAL #1   Title be independent in advanced HEP for further improvements in  strength, ROM and balance  03/12/16   Time 8   Period Weeks   Status On-going     PT LONG TERM GOAL #2   Title reduce FOTO to < or = to 46% limitation indicating improved function with ADLs   Time 8   Period Weeks   Status On-going     PT LONG TERM GOAL #3   Title perform TUG in < or = to 18 seconds to reduce risk of falls   Time 8   Period Weeks   Status On-going     PT LONG TERM GOAL #4   Title Improved hip and knee strength to at least 4-/5 for greater ease with rising from a chair and  with standing > 10 min    Time 8   Period Weeks   Status On-going     PT LONG TERM GOAL #5   Title The patient will be able to ambulate 400 feet in six minutes needed for shorter distance community ambulation with appropriate assistive device   Status Achieved     PT LONG TERM GOAL #6   Title BERG balance score improved to 37/56 indicating improved safety with walking and standing dynamic activities   Time 8   Period Weeks   Status On-going     PT LONG TERM GOAL #7   Title Able to walk 650 feet in 6 minutes needed for travelling    Time --   Period --   Status Achieved     PT LONG TERM GOAL #8   Title Timed up and go test improved to 17 sec indicating improved agility with sit to stand and gait speed   Time 8   Period Weeks   Status On-going               Plan - 04/09/16 0810    Clinical Impression Statement Continues to have wide based stance waddling type gait from distal and proximal LE weakness.  Good improvement in Six minute walk test by 200 feet compared to  1 month ago with this long term goal met.   Dizziness reported with sit to stand and following ambulation but resolves quickly.  Therapist closely monitoring  dizziness, pain and fatigue level and providing close supervision for safety.       PT Next Visit Plan Core and LE strengthening;  resume leg press; ambulation distance; balance      Patient will benefit from skilled therapeutic intervention in order to  improve the following deficits and impairments:     Visit Diagnosis: Muscle weakness (generalized)  Difficulty in walking, not elsewhere classified  Repeated falls  Arthralgia of right lower leg     Problem List Patient Active Problem List   Diagnosis Date Noted  . Leg edema, right 02/20/2015  . BPH (benign prostatic hypertrophy) 12/09/2014  . Chronic radicular low back pain 08/06/2012  . COPD (chronic obstructive pulmonary disease) ([place]) 04/21/2012  . Hyponatremia 03/29/2012  . Localized osteoarthrosis not specified whether primary or secondary, lower leg 06/20/2010  . VARICOSE VEINS LOWER EXTREMITIES W/INFLAMMATION 02/12/2010  . [place] LOCALIZED OSTEOARTHROSIS INVOLVING HAND 10/16/2009  . DUPUYTREN'S CONTRACTURE, RIGHT 07/04/2009  . DEGENERATIVE DISC DISEASE, LUMBOSACRAL SPINE W/RADICULOPATHY 06/19/2009  . CARCINOMA, BASAL CELL 02/07/2009  . ANEMIA OF CHRONIC DISEASE 02/07/2009  . BURSITIS, RIGHT SHOULDER 10/07/2008  . CATARACT ASSOCIATED WITH OTHER SYNDROMES 08/01/2008  . DRY SKIN 08/01/2008  . CERUMEN IMPACTION, BILATERAL 03/01/2008  . CRAMP IN LIMB 01/18/2008  . DEPRESSION, SITUATIONAL, ACUTE 12/28/2007  . CONSTIPATION, DRUG INDUCED 12/28/2007  . POLYNEUROPATHY OTHER DISEASES CLASSIFIED ELSW 09/24/2007  . WEIGHT LOSS, ABNORMAL 01/07/2007  . Hyperlipidemia 09/22/2006  . Essential hypertension 09/22/2006  . GERD 09/22/2006  . OSTEOARTHRITIS 09/22/2006   Ruben Im, PT 04/09/16 8:40 AM Phone: [phone] Fax: [phone]  Alvera Singh 04/09/2016, 8:39 AM  [facility] [address] [place], [place], [address] Phone: [phone]   Fax:  [phone]  Name: Darin [name] MRN: [id number] Date of Birth: 1931-05-22

## 2016-04-12 ENCOUNTER — Ambulatory Visit: Payer: Medicare Other | Admitting: Physical Therapy

## 2016-04-12 ENCOUNTER — Telehealth: Payer: Self-pay | Admitting: Family Medicine

## 2016-04-12 DIAGNOSIS — R262 Difficulty in walking, not elsewhere classified: Secondary | ICD-10-CM | POA: Diagnosis not present

## 2016-04-12 DIAGNOSIS — R296 Repeated falls: Secondary | ICD-10-CM | POA: Diagnosis not present

## 2016-04-12 DIAGNOSIS — M6281 Muscle weakness (generalized): Secondary | ICD-10-CM

## 2016-04-12 DIAGNOSIS — G8929 Other chronic pain: Secondary | ICD-10-CM | POA: Diagnosis not present

## 2016-04-12 DIAGNOSIS — M545 Low back pain: Secondary | ICD-10-CM | POA: Diagnosis not present

## 2016-04-12 DIAGNOSIS — I10 Essential (primary) hypertension: Secondary | ICD-10-CM

## 2016-04-12 DIAGNOSIS — M25561 Pain in right knee: Secondary | ICD-10-CM | POA: Diagnosis not present

## 2016-04-12 MED ORDER — AMLODIPINE BESYLATE 5 MG PO TABS: 5.0000 mg | ORAL_TABLET | Freq: Every day | ORAL | 3 refills | Status: DC

## 2016-04-12 MED ORDER — GABAPENTIN 100 MG PO CAPS: 100.0000 mg | ORAL_CAPSULE | Freq: Four times a day (QID) | ORAL | 1 refills | Status: DC

## 2016-04-12 NOTE — Telephone Encounter (Signed)
Pt request refill  gabapentin (NEURONTIN) 100 MG capsule amLODipine (NORVASC) 5 MG tablet  90 day w/refills  [facility] [employer]

## 2016-04-12 NOTE — Therapy (Signed)
[facility] [address], [place], [place], [address] Phone: [phone]   Fax:  [phone]  Physical Therapy Treatment  Patient Details  Name: Darin [name] MRN: [id number] Date of Birth: 30-Dec-1931 Referring Provider: Dr. Elease Hashimoto  Encounter Date: 04/12/2016      PT End of Session - 04/12/16 0810    Visit Number 24   Number of Visits 30   Date for PT Re-Evaluation 2016/05/15   Authorization Type G-codes at 30   PT Start Time 0800   PT Stop Time 0844   PT Time Calculation (min) 44 min   Activity Tolerance Patient tolerated treatment well      Past Medical History:  Diagnosis Date  . Arthritis   . GERD (gastroesophageal reflux disease)   . H/O hiatal hernia    "FOR YEARS" " NO PROBLEM"  . Hyperlipidemia   . Hypertension   . Knee pain   . Neuropathy (HCC)    PERIPHERAL   . Osteoporosis   . PMR (polymyalgia rheumatica) (HCC)   . Polymyalgia ([place]) 5 YEARS    Past Surgical History:  Procedure Laterality Date  . CARPAL TUNNEL RELEASE    . CATARACT EXTRACTION     right  . INGUINAL HERNIA REPAIR  10/14/2011   Procedure: HERNIA REPAIR INGUINAL ADULT;  Surgeon: [name], MD;  Location: [place];  Service: General;  Laterality: Right;  Right Inguinal Hernia Repair with Mesh  . TONSILLECTOMY    . VARICOSE VEIN SURGERY      There were no vitals filed for this visit.      Subjective Assessment - 04/12/16 0759    Subjective I haven't been out of the house since Tuesday because of the snow.  My legs feel about like usual, "annoying but not terrible."     Currently in Pain? Yes   Pain Score 4    Pain Location Leg   Pain Orientation Right;Left   Pain Onset More than a month ago   Pain Frequency Constant   Aggravating Factors  rising            [facility] PT Assessment - 04/12/16 0001      Timed Up and Go Test   Manual TUG (seconds) 17.5                     [facility] Adult PT  Treatment/Exercise - 04/12/16 0001      Ambulation/Gait   Ambulation Distance (Feet) 400 Feet   Gait Comments wide stance     Lumbar Exercises: Machines for Strengthening   Leg Press Seat 9 80# bilateral, single leg 40# 25x each     Knee/Hip Exercises: Aerobic   Stationary Bike 5 minutes L1   Nustep L3 10 min seat 11  Therapist present to discuss treatment     Knee/Hip Exercises: Standing   Forward Step Up Right;Left;1 set;15 reps;Hand Hold: 1;Step Height: 2"  step ups on foam   Other Standing Knee Exercises Stair step: hip flexor, HS and calf stretches 5x right/left   Other Standing Knee Exercises Standing on foam with step taps 15x                  PT Short Term Goals - 04/12/16 0819      PT SHORT TERM GOAL #1   Title be independent in initial HEP  02/13/16     PT [place] #2   Title perform TUG in < or =  to 20 seconds to improve balance   Status Achieved     PT SHORT TERM GOAL #3   Title The patient will have improved hip flexor muscle length to 5 degrees and HS length to 55 degrees needed for improved stride length with ambulation     PT SHORT TERM GOAL #4   Title BERG balance test improved from 29/56 to 33/56 indicating improved safety for standing ADLs like grooming and dresssing   Status Achieved           PT Long Term Goals - 04/12/16 0819      PT LONG TERM GOAL #1   Title be independent in advanced HEP for further improvements in strength, ROM and balance  03/12/16   Period Weeks   Status On-going     PT LONG TERM GOAL #2   Title reduce FOTO to < or = to 46% limitation indicating improved function with ADLs   Time 8   Period Weeks   Status On-going     PT LONG TERM GOAL #3   Title perform TUG in < or = to 18 seconds to reduce risk of falls   Time 8   Period Weeks   Status On-going     PT LONG TERM GOAL #4   Title Improved hip and knee strength to at least 4-/5 for greater ease with rising from a chair and  with standing > 10 min     Time 8   Period Weeks   Status On-going     PT LONG TERM GOAL #5   Title The patient will be able to ambulate 400 feet in six minutes needed for shorter distance community ambulation with appropriate assistive device   Status Achieved     PT LONG TERM GOAL #6   Title BERG balance score improved to 37/56 indicating improved safety with walking and standing dynamic activities   Time 8   Period Weeks   Status On-going     PT LONG TERM GOAL #7   Title Able to walk 650 feet in 6 minutes needed for travelling    Status Achieved     PT LONG TERM GOAL #8   Title Timed up and go test improved to 17 sec indicating improved agility with sit to stand and gait speed   Period Weeks   Status On-going               Plan - 04/12/16 0811    Clinical Impression Statement The patient is able to perform a progression of dynamic standing balance exercises on soft surface foam with just one UE assist and contact guard assist from therapist for safety.   Good improvement in Timed Up and Go time.  Able to increase resistance on leg press  with single and double leg.  Therapist closely monitoring response with all including intermittent dizziness.     PT Next Visit Plan Core and LE strengthening;  leg press; ambulation distance; balance;  recheck BERG      Patient will benefit from skilled therapeutic intervention in order to improve the following deficits and impairments:     Visit Diagnosis: Muscle weakness (generalized)  Difficulty in walking, not elsewhere classified  Repeated falls     Problem List Patient Active Problem List   Diagnosis Date Noted  . Leg edema, right 02/20/2015  . BPH (benign prostatic hypertrophy) 12/09/2014  . Chronic radicular low back pain 08/06/2012  . COPD (chronic obstructive pulmonary disease) ([place]) 04/21/2012  .  Hyponatremia 03/29/2012  . Localized osteoarthrosis not specified whether primary or secondary, lower leg 06/20/2010  . VARICOSE VEINS  LOWER EXTREMITIES W/INFLAMMATION 02/12/2010  . [place] LOCALIZED OSTEOARTHROSIS INVOLVING HAND 10/16/2009  . DUPUYTREN'S CONTRACTURE, RIGHT 07/04/2009  . DEGENERATIVE DISC DISEASE, LUMBOSACRAL SPINE W/RADICULOPATHY 06/19/2009  . CARCINOMA, BASAL CELL 02/07/2009  . ANEMIA OF CHRONIC DISEASE 02/07/2009  . BURSITIS, RIGHT SHOULDER 10/07/2008  . CATARACT ASSOCIATED WITH OTHER SYNDROMES 08/01/2008  . DRY SKIN 08/01/2008  . CERUMEN IMPACTION, BILATERAL 03/01/2008  . CRAMP IN LIMB 01/18/2008  . DEPRESSION, SITUATIONAL, ACUTE 12/28/2007  . CONSTIPATION, DRUG INDUCED 12/28/2007  . POLYNEUROPATHY OTHER DISEASES CLASSIFIED ELSW 09/24/2007  . WEIGHT LOSS, ABNORMAL 01/07/2007  . Hyperlipidemia 09/22/2006  . Essential hypertension 09/22/2006  . GERD 09/22/2006  . OSTEOARTHRITIS 09/22/2006   Ruben Im, PT 04/12/16 9:06 AM Phone: [phone] Fax: [phone]  Alvera Singh 04/12/2016, 9:05 AM  [facility] [address] [place], [place], [address] Phone: [phone]   Fax:  [phone]  Name: Darin [name] MRN: [id number] Date of Birth: [birth year]

## 2016-04-12 NOTE — Telephone Encounter (Signed)
Medication sent in for patient. 

## 2016-04-16 ENCOUNTER — Ambulatory Visit: Payer: Medicare Other | Admitting: Physical Therapy

## 2016-04-16 DIAGNOSIS — R262 Difficulty in walking, not elsewhere classified: Secondary | ICD-10-CM | POA: Diagnosis not present

## 2016-04-16 DIAGNOSIS — M25561 Pain in right knee: Secondary | ICD-10-CM | POA: Diagnosis not present

## 2016-04-16 DIAGNOSIS — M6281 Muscle weakness (generalized): Secondary | ICD-10-CM

## 2016-04-16 DIAGNOSIS — M545 Low back pain: Secondary | ICD-10-CM | POA: Diagnosis not present

## 2016-04-16 DIAGNOSIS — R296 Repeated falls: Secondary | ICD-10-CM | POA: Diagnosis not present

## 2016-04-16 DIAGNOSIS — G8929 Other chronic pain: Secondary | ICD-10-CM | POA: Diagnosis not present

## 2016-04-16 NOTE — Therapy (Signed)
[facility] [address] [place], [place], [address] Phone: [phone]   Fax:  [phone]  Physical Therapy Treatment  Patient Details  Name: Darin [name] MRN: [id number] Date of Birth: 12-11-1931 Referring Provider: Dr. Elease Hashimoto  Encounter Date: 04/16/2016      PT End of Session - 04/16/16 1216    Visit Number 25   Number of Visits 30   Date for PT Re-Evaluation June 01, 2016   Authorization Type G-codes at 63   PT Start Time 0755   PT Stop Time 0840   PT Time Calculation (min) 45 min   Activity Tolerance Patient tolerated treatment well      Past Medical History:  Diagnosis Date  . Arthritis   . GERD (gastroesophageal reflux disease)   . H/O hiatal hernia    "FOR YEARS" " NO PROBLEM"  . Hyperlipidemia   . Hypertension   . Knee pain   . Neuropathy (HCC)    PERIPHERAL   . Osteoporosis   . PMR (polymyalgia rheumatica) (HCC)   . Polymyalgia ([place]) 5 YEARS    Past Surgical History:  Procedure Laterality Date  . CARPAL TUNNEL RELEASE    . CATARACT EXTRACTION     right  . INGUINAL HERNIA REPAIR  10/14/2011   Procedure: HERNIA REPAIR INGUINAL ADULT;  Surgeon: [name], MD;  Location: [place];  Service: General;  Laterality: Right;  Right Inguinal Hernia Repair with Mesh  . TONSILLECTOMY    . VARICOSE VEIN SURGERY      There were no vitals filed for this visit.      Subjective Assessment - 04/16/16 0805    Subjective Reports minor soreness following last session.  Had some increased leg pain yesterday which he attributes to the weather.     Currently in Pain? Yes   Pain Score 4    Pain Orientation Right;Left   Pain Type Chronic pain            [facility] PT Assessment - 04/16/16 0001      [place] Balance Test   Sit to Stand Able to stand  independently using hands   Standing Unsupported Able to stand safely 2 minutes   Sitting with Back Unsupported but Feet Supported on Floor or Stool Able  to sit safely and securely 2 minutes   Stand to Sit Controls descent by using hands   Transfers Able to transfer safely, definite need of hands   Standing Unsupported with Eyes Closed Able to stand 10 seconds safely   Standing Ubsupported with Feet Together Needs help to attain position and unable to hold for 15 seconds   From Standing, Reach Forward with Outstretched Arm Can reach forward >5 cm safely (2")   From Standing Position, Pick up Object from Floor Able to pick up shoe, needs supervision   From Standing Position, Turn to Look Behind Over each Shoulder Looks behind one side only/other side shows less weight shift   Turn 360 Degrees Able to turn 360 degrees safely but slowly   Standing Unsupported, Alternately Place Feet on Step/Stool Able to stand independently and complete 8 steps >20 seconds   Standing Unsupported, One Foot in Front Able to take small step independently and hold 30 seconds   Standing on One Leg Tries to lift leg/unable to hold 3 seconds but remains standing independently   Total Score 37  [place] Adult PT Treatment/Exercise - 04/16/16 0001      Lumbar Exercises: Machines for Strengthening   Leg Press Seat 9 80# bilateral, single leg 40# 25x each     Knee/Hip Exercises: Aerobic   Nustep L3 10 min seat 11  Therapist present to discuss treatment     Knee/Hip Exercises: Standing   Other Standing Knee Exercises Stair step: hip flexor, HS and calf stretches 5x right/left     Knee/Hip Exercises: Seated   Long Arc Quad Strengthening;Right;Left;1 set;20 reps   Long Arc Quad Limitations red band   Clamshell with TheraBand Green   Knee/Hip Flexion 25x   Marching Strengthening;Right;Left;1 set;15 reps   Marching Limitations red band                  PT Short Term Goals - 04/16/16 1222      PT SHORT TERM GOAL #1   Title be independent in initial HEP  02/13/16   Status Achieved     PT SHORT TERM GOAL #2   Title perform  TUG in < or = to 20 seconds to improve balance   Status Achieved     PT SHORT TERM GOAL #3   Title The patient will have improved hip flexor muscle length to 5 degrees and HS length to 55 degrees needed for improved stride length with ambulation   Status Achieved     PT SHORT TERM GOAL #4   Title BERG balance test improved from 29/56 to 33/56 indicating improved safety for standing ADLs like grooming and dresssing   Status Achieved           PT Long Term Goals - 04/16/16 1222      PT LONG TERM GOAL #1   Title be independent in advanced HEP for further improvements in strength, ROM and balance  03/12/16   Time 8   Period Weeks   Status On-going     PT LONG TERM GOAL #2   Title reduce FOTO to < or = to 46% limitation indicating improved function with ADLs   Time 8   Period Weeks   Status On-going     PT LONG TERM GOAL #3   Title perform TUG in < or = to 18 seconds to reduce risk of falls   Time 8   Period Weeks   Status On-going     PT LONG TERM GOAL #4   Title Improved hip and knee strength to at least 4-/5 for greater ease with rising from a chair and  with standing > 10 min    Time 8   Period Weeks   Status On-going     PT LONG TERM GOAL #5   Title The patient will be able to ambulate 400 feet in six minutes needed for shorter distance community ambulation with appropriate assistive device   Status Achieved     PT LONG TERM GOAL #6   Status Achieved     PT LONG TERM GOAL #7   Title Able to walk 650 feet in 6 minutes needed for travelling    Status Achieved               Plan - 04/16/16 1217    Clinical Impression Statement The patient has a 4 point improvement on BERG balance test but still a significant risk of falls (80%).  Discussed results with patient.  Poor balance with narrow base of support requires therapist assist to recover balance when attempting.   Dizziness  with head movements and sit to stand.  Continue 3 more weeks to prepare for travel  to [place].  Therapist providing close supervision and assist for safety.     PT Next Visit Plan Core and LE strengthening;  leg press; ambulation distance; balance      Patient will benefit from skilled therapeutic intervention in order to improve the following deficits and impairments:     Visit Diagnosis: Muscle weakness (generalized)  Difficulty in walking, not elsewhere classified  Repeated falls     Problem List Patient Active Problem List   Diagnosis Date Noted  . Leg edema, right 02/20/2015  . BPH (benign prostatic hypertrophy) 12/09/2014  . Chronic radicular low back pain 08/06/2012  . COPD (chronic obstructive pulmonary disease) ([place]) 04/21/2012  . Hyponatremia 03/29/2012  . Localized osteoarthrosis not specified whether primary or secondary, lower leg 06/20/2010  . VARICOSE VEINS LOWER EXTREMITIES W/INFLAMMATION 02/12/2010  . [place] LOCALIZED OSTEOARTHROSIS INVOLVING HAND 10/16/2009  . DUPUYTREN'S CONTRACTURE, RIGHT 07/04/2009  . DEGENERATIVE DISC DISEASE, LUMBOSACRAL SPINE W/RADICULOPATHY 06/19/2009  . CARCINOMA, BASAL CELL 02/07/2009  . ANEMIA OF CHRONIC DISEASE 02/07/2009  . BURSITIS, RIGHT SHOULDER 10/07/2008  . CATARACT ASSOCIATED WITH OTHER SYNDROMES 08/01/2008  . DRY SKIN 08/01/2008  . CERUMEN IMPACTION, BILATERAL 03/01/2008  . CRAMP IN LIMB 01/18/2008  . DEPRESSION, SITUATIONAL, ACUTE 12/28/2007  . CONSTIPATION, DRUG INDUCED 12/28/2007  . POLYNEUROPATHY OTHER DISEASES CLASSIFIED ELSW 09/24/2007  . WEIGHT LOSS, ABNORMAL 01/07/2007  . Hyperlipidemia 09/22/2006  . Essential hypertension 09/22/2006  . GERD 09/22/2006  . OSTEOARTHRITIS 09/22/2006    Ruben Im, PT 04/16/16 12:28 PM Phone: [phone] Fax: [phone]  Alvera Singh 04/16/2016, 12:27 PM  [facility] [address] [place], [place], [address] Phone: [phone]   Fax:  [phone]  Name: Stephone Mcclarnon MRN:  [id number] Date of Birth: [birth year]

## 2016-04-16 NOTE — Therapy (Signed)
[facility] [address], [place], [place], [address] Phone: [phone]   Fax:  [phone]  Physical Therapy Treatment  Patient Details  Name: Darin [name] MRN: [id number] Date of Birth: 07-23-1931 Referring Provider: Dr. Elease Hashimoto  Encounter Date: 04/16/2016      PT End of Session - 04/16/16 1216    Visit Number 25   Number of Visits 30   Date for PT Re-Evaluation 05-16-2016   Authorization Type G-codes at 1   PT Start Time 0755   PT Stop Time 0840   PT Time Calculation (min) 45 min   Activity Tolerance Patient tolerated treatment well      Past Medical History:  Diagnosis Date  . Arthritis   . GERD (gastroesophageal reflux disease)   . H/O hiatal hernia    "FOR YEARS" " NO PROBLEM"  . Hyperlipidemia   . Hypertension   . Knee pain   . Neuropathy (HCC)    PERIPHERAL   . Osteoporosis   . PMR (polymyalgia rheumatica) (HCC)   . Polymyalgia ([place]) 5 YEARS    Past Surgical History:  Procedure Laterality Date  . CARPAL TUNNEL RELEASE    . CATARACT EXTRACTION     right  . INGUINAL HERNIA REPAIR  10/14/2011   Procedure: HERNIA REPAIR INGUINAL ADULT;  Surgeon: [name], MD;  Location: [place];  Service: General;  Laterality: Right;  Right Inguinal Hernia Repair with Mesh  . TONSILLECTOMY    . VARICOSE VEIN SURGERY      There were no vitals filed for this visit.      Subjective Assessment - 04/16/16 0805    Subjective Reports minor soreness following last session.  Had some increased leg pain yesterday which he attributes to the weather.     Currently in Pain? Yes   Pain Score 4    Pain Orientation Right;Left   Pain Type Chronic pain            [facility] PT Assessment - 04/16/16 0001      Berg Balance Test   Sit to Stand Able to stand  independently using hands   Standing Unsupported Able to stand safely 2 minutes   Sitting with Back Unsupported but Feet Supported on Floor or Stool Able  to sit safely and securely 2 minutes   Stand to Sit Controls descent by using hands   Transfers Able to transfer safely, definite need of hands   Standing Unsupported with Eyes Closed Able to stand 10 seconds safely   Standing Ubsupported with Feet Together Needs help to attain position and unable to hold for 15 seconds   From Standing, Reach Forward with Outstretched Arm Can reach forward >5 cm safely (2")   From Standing Position, Pick up Object from Floor Able to pick up shoe, needs supervision   From Standing Position, Turn to Look Behind Over each Shoulder Looks behind one side only/other side shows less weight shift   Turn 360 Degrees Able to turn 360 degrees safely but slowly   Standing Unsupported, Alternately Place Feet on Step/Stool Able to stand independently and complete 8 steps >20 seconds   Standing Unsupported, One Foot in Front Able to take small step independently and hold 30 seconds   Standing on One Leg Tries to lift leg/unable to hold 3 seconds but remains standing independently   Total Score 37  [place] Adult PT Treatment/Exercise - 04/16/16 0001      Lumbar Exercises: Machines for Strengthening   Leg Press Seat 9 80# bilateral, single leg 40# 25x each     Knee/Hip Exercises: Aerobic   Nustep L3 10 min seat 11  Therapist present to discuss treatment     Knee/Hip Exercises: Standing   Other Standing Knee Exercises Stair step: hip flexor, HS and calf stretches 5x right/left                  PT Short Term Goals - 04/16/16 1222      PT SHORT TERM GOAL #1   Title be independent in initial HEP  02/13/16   Status Achieved     PT SHORT TERM GOAL #2   Title perform TUG in < or = to 20 seconds to improve balance   Status Achieved     PT SHORT TERM GOAL #3   Title The patient will have improved hip flexor muscle length to 5 degrees and HS length to 55 degrees needed for improved stride length with ambulation   Status Achieved      PT SHORT TERM GOAL #4   Title BERG balance test improved from 29/56 to 33/56 indicating improved safety for standing ADLs like grooming and dresssing   Status Achieved           PT Long Term Goals - 04/16/16 1222      PT LONG TERM GOAL #1   Title be independent in advanced HEP for further improvements in strength, ROM and balance  03/12/16   Time 8   Period Weeks   Status On-going     PT LONG TERM GOAL #2   Title reduce FOTO to < or = to 46% limitation indicating improved function with ADLs   Time 8   Period Weeks   Status On-going     PT LONG TERM GOAL #3   Title perform TUG in < or = to 18 seconds to reduce risk of falls   Time 8   Period Weeks   Status On-going     PT LONG TERM GOAL #4   Title Improved hip and knee strength to at least 4-/5 for greater ease with rising from a chair and  with standing > 10 min    Time 8   Period Weeks   Status On-going     PT LONG TERM GOAL #5   Title The patient will be able to ambulate 400 feet in six minutes needed for shorter distance community ambulation with appropriate assistive device   Status Achieved     PT LONG TERM GOAL #6   Status Achieved     PT LONG TERM GOAL #7   Title Able to walk 650 feet in 6 minutes needed for travelling    Status Achieved               Plan - 04/16/16 1217    Clinical Impression Statement The patient has a 4 point improvement on BERG balance test but still a significant risk of falls (80%).  Discussed results with patient.  Poor balance with narrow base of support requires therapist assist to recover balance when attempting.   Dizziness with head movements and sit to stand.  Continue 3 more weeks to prepare for travel to [place].  Therapist providing close supervision and assist for safety.     PT Next Visit Plan Core and LE strengthening;  leg press; ambulation distance; balance  Patient will benefit from skilled therapeutic intervention in order to improve the following  deficits and impairments:     Visit Diagnosis: Muscle weakness (generalized)  Difficulty in walking, not elsewhere classified  Repeated falls     Problem List Patient Active Problem List   Diagnosis Date Noted  . Leg edema, right 02/20/2015  . BPH (benign prostatic hypertrophy) 12/09/2014  . Chronic radicular low back pain 08/06/2012  . COPD (chronic obstructive pulmonary disease) ([place]) 04/21/2012  . Hyponatremia 03/29/2012  . Localized osteoarthrosis not specified whether primary or secondary, lower leg 06/20/2010  . VARICOSE VEINS LOWER EXTREMITIES W/INFLAMMATION 02/12/2010  . [place] LOCALIZED OSTEOARTHROSIS INVOLVING HAND 10/16/2009  . DUPUYTREN'S CONTRACTURE, RIGHT 07/04/2009  . DEGENERATIVE DISC DISEASE, LUMBOSACRAL SPINE W/RADICULOPATHY 06/19/2009  . CARCINOMA, BASAL CELL 02/07/2009  . ANEMIA OF CHRONIC DISEASE 02/07/2009  . BURSITIS, RIGHT SHOULDER 10/07/2008  . CATARACT ASSOCIATED WITH OTHER SYNDROMES 08/01/2008  . DRY SKIN 08/01/2008  . CERUMEN IMPACTION, BILATERAL 03/01/2008  . CRAMP IN LIMB 01/18/2008  . DEPRESSION, SITUATIONAL, ACUTE 12/28/2007  . CONSTIPATION, DRUG INDUCED 12/28/2007  . POLYNEUROPATHY OTHER DISEASES CLASSIFIED ELSW 09/24/2007  . WEIGHT LOSS, ABNORMAL 01/07/2007  . Hyperlipidemia 09/22/2006  . Essential hypertension 09/22/2006  . GERD 09/22/2006  . OSTEOARTHRITIS 09/22/2006    Alvera Singh 04/16/2016, 12:25 PM  [facility] [address] [place], [place], [address] Phone: [phone]   Fax:  [phone]  Name: Darin [name] MRN: [id number] Date of Birth: January 14, 1932

## 2016-04-19 ENCOUNTER — Encounter: Payer: Self-pay | Admitting: Physical Therapy

## 2016-04-19 ENCOUNTER — Ambulatory Visit: Payer: Medicare Other | Admitting: Physical Therapy

## 2016-04-19 DIAGNOSIS — M545 Low back pain, unspecified: Secondary | ICD-10-CM

## 2016-04-19 DIAGNOSIS — R262 Difficulty in walking, not elsewhere classified: Secondary | ICD-10-CM | POA: Diagnosis not present

## 2016-04-19 DIAGNOSIS — G8929 Other chronic pain: Secondary | ICD-10-CM | POA: Diagnosis not present

## 2016-04-19 DIAGNOSIS — M25561 Pain in right knee: Secondary | ICD-10-CM

## 2016-04-19 DIAGNOSIS — R296 Repeated falls: Secondary | ICD-10-CM | POA: Diagnosis not present

## 2016-04-19 DIAGNOSIS — M6281 Muscle weakness (generalized): Secondary | ICD-10-CM | POA: Diagnosis not present

## 2016-04-19 NOTE — Therapy (Signed)
[facility] [address], [place], [place], [address] Phone: [phone]   Fax:  [phone]  Physical Therapy Treatment  Patient Details  Name: Darin [name] MRN: [id number] Date of Birth: [birth year] Referring Provider: Dr. Elease Hashimoto  Encounter Date: 04/19/2016      PT End of Session - 04/19/16 0757    Visit Number 26   Number of Visits 30   Date for PT Re-Evaluation May 09, 2016   Authorization Type G-codes at 81   PT Start Time 0756   PT Stop Time 0847   PT Time Calculation (min) 51 min   Activity Tolerance Patient tolerated treatment well   Behavior During Therapy [facility] for tasks assessed/performed      Past Medical History:  Diagnosis Date  . Arthritis   . GERD (gastroesophageal reflux disease)   . H/O hiatal hernia    "FOR YEARS" " NO PROBLEM"  . Hyperlipidemia   . Hypertension   . Knee pain   . Neuropathy (HCC)    PERIPHERAL   . Osteoporosis   . PMR (polymyalgia rheumatica) (HCC)   . Polymyalgia ([place]) 5 YEARS    Past Surgical History:  Procedure Laterality Date  . CARPAL TUNNEL RELEASE    . CATARACT EXTRACTION     right  . INGUINAL HERNIA REPAIR  10/14/2011   Procedure: HERNIA REPAIR INGUINAL ADULT;  Surgeon: [name], MD;  Location: [place];  Service: General;  Laterality: Right;  Right Inguinal Hernia Repair with Mesh  . TONSILLECTOMY    . VARICOSE VEIN SURGERY      There were no vitals filed for this visit.      Subjective Assessment - 04/19/16 0756    Subjective Pt reports some leg soreness today Lt calf and Rt whole leg   Pertinent History chronic lumbar DDD, polyneuropathy;  HTN   Limitations Walking;House hold activities;Standing   How long can you sit comfortably? as long as  I want but trouble rising following    How long can you stand comfortably? 5 min   How long can you walk comfortably? 10 min   Diagnostic tests none recently   Patient Stated Goals walk normally;   maintain my balance   Currently in Pain? Yes   Pain Score 6    Pain Location Leg   Pain Orientation Right;Left   Pain Descriptors / Indicators Sore   Pain Type Chronic pain   Pain Onset More than a month ago   Pain Frequency Constant                         [facility] Adult PT Treatment/Exercise - 04/19/16 0001      Knee/Hip Exercises: Stretches   Active Hamstring Stretch Right;Left;5 reps   Active Hamstring Stretch Limitations on step    Gastroc Stretch Both;10 seconds  with neck extension   Other Knee/Hip Stretches stair hip flexor stretch 5x right/left     Knee/Hip Exercises: Aerobic   Nustep L3 10 min seat 11  Therapist present to discuss treatment     Knee/Hip Exercises: Seated   Sit to [employer] 20 reps  From high/low table     Knee/Hip Exercises: Supine   Short Arc Quad Sets Strengthening;Both;2 sets;20 reps  #5   Bridges with [employer] Strengthening;Both;2 sets;10 reps     Modalities   Modalities Electrical Stimulation;Moist Heat     Moist Heat Therapy   Number Minutes Moist Heat 18 Minutes  Moist Heat Location Knee  Bil calves     Electrical Stimulation   Electrical Stimulation Location Bil calves   Electrical Stimulation Action Pre mod   Electrical Stimulation Parameters 21 ma 14 ma   Electrical Stimulation Goals Pain                  PT Short Term Goals - 04/16/16 1222      PT SHORT TERM GOAL #1   Title be independent in initial HEP  02/13/16   Status Achieved     PT SHORT TERM GOAL #2   Title perform TUG in < or = to 20 seconds to improve balance   Status Achieved     PT SHORT TERM GOAL #3   Title The patient will have improved hip flexor muscle length to 5 degrees and HS length to 55 degrees needed for improved stride length with ambulation   Status Achieved     PT SHORT TERM GOAL #4   Title BERG balance test improved from 29/56 to 33/56 indicating improved safety for standing ADLs like grooming and dresssing   Status  Achieved           PT Long Term Goals - 04/16/16 1222      PT LONG TERM GOAL #1   Title be independent in advanced HEP for further improvements in strength, ROM and balance  03/12/16   Time 8   Period Weeks   Status On-going     PT LONG TERM GOAL #2   Title reduce FOTO to < or = to 46% limitation indicating improved function with ADLs   Time 8   Period Weeks   Status On-going     PT LONG TERM GOAL #3   Title perform TUG in < or = to 18 seconds to reduce risk of falls   Time 8   Period Weeks   Status On-going     PT LONG TERM GOAL #4   Title Improved hip and knee strength to at least 4-/5 for greater ease with rising from a chair and  with standing > 10 min    Time 8   Period Weeks   Status On-going     PT LONG TERM GOAL #5   Title The patient will be able to ambulate 400 feet in six minutes needed for shorter distance community ambulation with appropriate assistive device   Status Achieved     PT LONG TERM GOAL #6   Status Achieved     PT LONG TERM GOAL #7   Title Able to walk 650 feet in 6 minutes needed for travelling    Status Achieved               Plan - 04/19/16 [id number]    Clinical Impression Statement Pt reports increased soreness in Bil LE. Pt able to tolerate all supine exercises well. Responded well to Estim and heat. Pt will continue to benefit from skilled therapy for LE strength and core stability needed for dynamic balance and efficient gait.    Rehab Potential Good   PT Frequency 2x / week   PT Duration 8 weeks   PT Treatment/Interventions ADLs/Self Care Home Management;Moist Heat;Electrical Stimulation;Therapeutic exercise;Neuromuscular re-education;Patient/family education;Manual techniques   PT Next Visit Plan Core and LE strengthening;  leg press; ambulation distance; balance   Consulted and Agree with Plan of Care Patient      Patient will benefit from skilled therapeutic intervention in order to improve the following deficits and  impairments:  Pain, Abnormal gait, Decreased balance, Decreased activity tolerance, Decreased range of motion, Decreased strength, Decreased mobility, Impaired flexibility, Difficulty walking  Visit Diagnosis: Muscle weakness (generalized)  Difficulty in walking, not elsewhere classified  Repeated falls  Arthralgia of right lower leg  Chronic midline low back pain without sciatica     Problem List Patient Active Problem List   Diagnosis Date Noted  . Leg edema, right 02/20/2015  . BPH (benign prostatic hypertrophy) 12/09/2014  . Chronic radicular low back pain 08/06/2012  . COPD (chronic obstructive pulmonary disease) ([place]) 04/21/2012  . Hyponatremia 03/29/2012  . Localized osteoarthrosis not specified whether primary or secondary, lower leg 06/20/2010  . VARICOSE VEINS LOWER EXTREMITIES W/INFLAMMATION 02/12/2010  . [place] LOCALIZED OSTEOARTHROSIS INVOLVING HAND 10/16/2009  . DUPUYTREN'S CONTRACTURE, RIGHT 07/04/2009  . DEGENERATIVE DISC DISEASE, LUMBOSACRAL SPINE W/RADICULOPATHY 06/19/2009  . CARCINOMA, BASAL CELL 02/07/2009  . ANEMIA OF CHRONIC DISEASE 02/07/2009  . BURSITIS, RIGHT SHOULDER 10/07/2008  . CATARACT ASSOCIATED WITH OTHER SYNDROMES 08/01/2008  . DRY SKIN 08/01/2008  . CERUMEN IMPACTION, BILATERAL 03/01/2008  . CRAMP IN LIMB 01/18/2008  . DEPRESSION, SITUATIONAL, ACUTE 12/28/2007  . CONSTIPATION, DRUG INDUCED 12/28/2007  . POLYNEUROPATHY OTHER DISEASES CLASSIFIED ELSW 09/24/2007  . WEIGHT LOSS, ABNORMAL 01/07/2007  . Hyperlipidemia 09/22/2006  . Essential hypertension 09/22/2006  . GERD 09/22/2006  . OSTEOARTHRITIS 09/22/2006    Mikle Bosworth PTA 04/19/2016, 8:39 AM  [facility] [address], [place], [place], [address] Phone: [phone]   Fax:  [phone]  Name: Darin [name] MRN: [id number] Date of Birth: [birth year]

## 2016-04-23 ENCOUNTER — Ambulatory Visit: Payer: Medicare Other | Admitting: Physical Therapy

## 2016-04-23 DIAGNOSIS — M6281 Muscle weakness (generalized): Secondary | ICD-10-CM

## 2016-04-23 DIAGNOSIS — R296 Repeated falls: Secondary | ICD-10-CM | POA: Diagnosis not present

## 2016-04-23 DIAGNOSIS — G8929 Other chronic pain: Secondary | ICD-10-CM | POA: Diagnosis not present

## 2016-04-23 DIAGNOSIS — M25561 Pain in right knee: Secondary | ICD-10-CM | POA: Diagnosis not present

## 2016-04-23 DIAGNOSIS — M545 Low back pain: Secondary | ICD-10-CM | POA: Diagnosis not present

## 2016-04-23 DIAGNOSIS — R262 Difficulty in walking, not elsewhere classified: Secondary | ICD-10-CM | POA: Diagnosis not present

## 2016-04-23 NOTE — Therapy (Signed)
[facility] [address], [place], [place], [address] Phone: [phone]   Fax:  [phone]  Physical Therapy Treatment  Patient Details  Name: Darin [name] MRN: [id number] Date of Birth: 1932/02/25 Referring Provider: Dr. Elease Hashimoto  Encounter Date: 04/23/2016      PT End of Session - 04/23/16 0842    Visit Number 27   Number of Visits 30   Date for PT Re-Evaluation 05/26/16   Authorization Type G-codes at 56   PT Start Time 0800   PT Stop Time 0845   PT Time Calculation (min) 45 min   Activity Tolerance Patient tolerated treatment well      Past Medical History:  Diagnosis Date  . Arthritis   . GERD (gastroesophageal reflux disease)   . H/O hiatal hernia    "FOR YEARS" " NO PROBLEM"  . Hyperlipidemia   . Hypertension   . Knee pain   . Neuropathy (HCC)    PERIPHERAL   . Osteoporosis   . PMR (polymyalgia rheumatica) (HCC)   . Polymyalgia ([place]) 5 YEARS    Past Surgical History:  Procedure Laterality Date  . CARPAL TUNNEL RELEASE    . CATARACT EXTRACTION     right  . INGUINAL HERNIA REPAIR  10/14/2011   Procedure: HERNIA REPAIR INGUINAL ADULT;  Surgeon: [name], MD;  Location: [facility];  Service: General;  Laterality: Right;  Right Inguinal Hernia Repair with Mesh  . TONSILLECTOMY    . VARICOSE VEIN SURGERY      There were no vitals filed for this visit.      Subjective Assessment - 04/23/16 0808    Subjective I bought a TENS unit for use when traveling.  Trip out of the country next week.  Will be doing lots of steps, walking, getting up and down.     Currently in Pain? Yes   Pain Score 5    Pain Location Leg   Pain Orientation Right;Left   Pain Type Chronic pain   Pain Onset More than a month ago   Pain Frequency Constant                         [facility] Adult PT Treatment/Exercise - 04/23/16 0001      Ambulation/Gait   Gait Comments 11 minutes with supervison  for safety     Lumbar Exercises: Machines for Strengthening   Leg Press Seat 9 80# bilateral, single leg 40# 25x each     Knee/Hip Exercises: Stretches   Active Hamstring Stretch Right;Left;5 reps   Active Hamstring Stretch Limitations on step    Gastroc Stretch Both;10 seconds  with neck extension   Other Knee/Hip Stretches stair hip flexor stretch 5x right/left     Knee/Hip Exercises: Standing   Forward Step Up Right;Left;1 set;5 reps     Knee/Hip Exercises: Seated   Other Seated Knee/Hip Exercises sit to stand from high table 20x   Sit to [employer] 5 reps                  PT Short Term Goals - 04/23/16 1243      PT SHORT TERM GOAL #1   Title be independent in initial HEP  02/13/16   Status Achieved     PT SHORT TERM GOAL #2   Title perform TUG in < or = to 20 seconds to improve balance   Status Achieved     PT SHORT TERM GOAL #  3   Title The patient will have improved hip flexor muscle length to 5 degrees and HS length to 55 degrees needed for improved stride length with ambulation   Status Achieved     PT SHORT TERM GOAL #4   Title BERG balance test improved from 29/56 to 33/56 indicating improved safety for standing ADLs like grooming and dresssing   Status Achieved           PT Long Term Goals - 04/23/16 1243      PT LONG TERM GOAL #1   Title be independent in advanced HEP for further improvements in strength, ROM and balance  03/12/16   Time 8   Period Weeks   Status On-going     PT LONG TERM GOAL #2   Title reduce FOTO to < or = to 46% limitation indicating improved function with ADLs   Time 8   Period Weeks   Status On-going     PT LONG TERM GOAL #3   Title perform TUG in < or = to 18 seconds to reduce risk of falls   Time 8   Period Weeks   Status On-going     PT LONG TERM GOAL #4   Title Improved hip and knee strength to at least 4-/5 for greater ease with rising from a chair and  with standing > 10 min    Time 8   Period Weeks    Status On-going     PT LONG TERM GOAL #5   Title The patient will be able to ambulate 400 feet in six minutes needed for shorter distance community ambulation with appropriate assistive device   Status Achieved     PT LONG TERM GOAL #6   Title BERG balance score improved to 37/56 indicating improved safety with walking and standing dynamic activities   Status Achieved     PT LONG TERM GOAL #7   Title Able to walk 650 feet in 6 minutes needed for travelling    Status Achieved     PT LONG TERM GOAL #8   Title Timed up and go test improved to 17 sec indicating improved agility with sit to stand and gait speed   Time 8   Period Weeks   Status On-going               Plan - 04/23/16 1236    Clinical Impression Statement The patient reports he would like to focus on ambulation endurance, sit to stand and climbing steps to prepare for his upcoming trip to [place] next week.  He reports he purchased a home TENS unit which he will take with him on his trip to help with his leg pain.  Discussed LE ROM ex's for circulation in sitting for his long flight and bus rides.  He reports mild dizziness with sit to stand.  Needs significant UE use to rise from a standard height chair.  Able to increase ambulation distance today.  Able to ascend a large step (similar to height needed to get on a bus) with left with 1 UE assist on railing but much more difficult ascending with right leg leading.   Therapist closely supervising for safety.     PT Next Visit Plan Core and LE strengthening;  leg press increase weight by 10#; ambulation distance; balance      Patient will benefit from skilled therapeutic intervention in order to improve the following deficits and impairments:     Visit Diagnosis: Muscle weakness (  generalized)  Difficulty in walking, not elsewhere classified  Repeated falls     Problem List Patient Active Problem List   Diagnosis Date Noted  . Leg edema, right 02/20/2015   . BPH (benign prostatic hypertrophy) 12/09/2014  . Chronic radicular low back pain 08/06/2012  . COPD (chronic obstructive pulmonary disease) ([place]) 04/21/2012  . Hyponatremia 03/29/2012  . Localized osteoarthrosis not specified whether primary or secondary, lower leg 06/20/2010  . VARICOSE VEINS LOWER EXTREMITIES W/INFLAMMATION 02/12/2010  . [place] LOCALIZED OSTEOARTHROSIS INVOLVING HAND 10/16/2009  . DUPUYTREN'S CONTRACTURE, RIGHT 07/04/2009  . DEGENERATIVE DISC DISEASE, LUMBOSACRAL SPINE W/RADICULOPATHY 06/19/2009  . CARCINOMA, BASAL CELL 02/07/2009  . ANEMIA OF CHRONIC DISEASE 02/07/2009  . BURSITIS, RIGHT SHOULDER 10/07/2008  . CATARACT ASSOCIATED WITH OTHER SYNDROMES 08/01/2008  . DRY SKIN 08/01/2008  . CERUMEN IMPACTION, BILATERAL 03/01/2008  . CRAMP IN LIMB 01/18/2008  . DEPRESSION, SITUATIONAL, ACUTE 12/28/2007  . CONSTIPATION, DRUG INDUCED 12/28/2007  . POLYNEUROPATHY OTHER DISEASES CLASSIFIED ELSW 09/24/2007  . WEIGHT LOSS, ABNORMAL 01/07/2007  . Hyperlipidemia 09/22/2006  . Essential hypertension 09/22/2006  . GERD 09/22/2006  . OSTEOARTHRITIS 09/22/2006   Ruben Im, PT 04/23/16 12:46 PM Phone: [phone] Fax: [phone] Alvera Singh 04/23/2016, 12:45 PM  [facility] [address], [place], [place], [address] Phone: [phone]   Fax:  [phone]  Name: Montrel Laveck MRN: [id number] Date of Birth: 1931-04-08

## 2016-04-25 ENCOUNTER — Telehealth: Payer: Self-pay | Admitting: Family Medicine

## 2016-04-25 NOTE — Telephone Encounter (Signed)
Pt needs new rx meloxicam 15 mg #90 w/refills and refill on finasteride 5 mg #90 w/refills

## 2016-04-26 ENCOUNTER — Ambulatory Visit: Payer: Medicare Other | Attending: Family Medicine | Admitting: Physical Therapy

## 2016-04-26 ENCOUNTER — Encounter: Payer: Self-pay | Admitting: Physical Therapy

## 2016-04-26 DIAGNOSIS — M25561 Pain in right knee: Secondary | ICD-10-CM | POA: Diagnosis not present

## 2016-04-26 DIAGNOSIS — R296 Repeated falls: Secondary | ICD-10-CM | POA: Insufficient documentation

## 2016-04-26 DIAGNOSIS — M6281 Muscle weakness (generalized): Secondary | ICD-10-CM

## 2016-04-26 DIAGNOSIS — R262 Difficulty in walking, not elsewhere classified: Secondary | ICD-10-CM | POA: Diagnosis not present

## 2016-04-26 MED ORDER — MELOXICAM 15 MG PO TABS: 15.0000 mg | ORAL_TABLET | Freq: Every day | ORAL | 2 refills | Status: DC

## 2016-04-26 MED ORDER — FINASTERIDE 5 MG PO TABS: 5.0000 mg | ORAL_TABLET | Freq: Every day | ORAL | 2 refills | Status: DC

## 2016-04-26 NOTE — Telephone Encounter (Signed)
Medication sent in for patient. 

## 2016-04-26 NOTE — Therapy (Signed)
[facility] [address], [place], [place], [address] Phone: [phone]   Fax:  [phone]  Physical Therapy Treatment  Patient Details  Name: Darin [name] MRN: [id number] Date of Birth: Oct 01, 1931 Referring Provider: Dr. Elease Hashimoto  Encounter Date: 04/26/2016      PT End of Session - 04/26/16 0844    Visit Number 28   Number of Visits 30   Date for PT Re-Evaluation 27-May-2016   Authorization Type G-codes at 30   PT Start Time 0800   PT Stop Time 0900   PT Time Calculation (min) 60 min   Activity Tolerance Patient tolerated treatment well   Behavior During Therapy [facility] for tasks assessed/performed      Past Medical History:  Diagnosis Date  . Arthritis   . GERD (gastroesophageal reflux disease)   . H/O hiatal hernia    "FOR YEARS" " NO PROBLEM"  . Hyperlipidemia   . Hypertension   . Knee pain   . Neuropathy (HCC)    PERIPHERAL   . Osteoporosis   . PMR (polymyalgia rheumatica) (HCC)   . Polymyalgia ([place]) 5 YEARS    Past Surgical History:  Procedure Laterality Date  . CARPAL TUNNEL RELEASE    . CATARACT EXTRACTION     right  . INGUINAL HERNIA REPAIR  10/14/2011   Procedure: HERNIA REPAIR INGUINAL ADULT;  Surgeon: [name], MD;  Location: [place];  Service: General;  Laterality: Right;  Right Inguinal Hernia Repair with Mesh  . TONSILLECTOMY    . VARICOSE VEIN SURGERY      There were no vitals filed for this visit.      Subjective Assessment - 04/26/16 0843    Subjective Pt is one week away from his trip to [place]. Pt reports some increase LE soreness today.    Pertinent History chronic lumbar DDD, polyneuropathy;  HTN   Limitations Walking;House hold activities;Standing   How long can you sit comfortably? as long as  I want but trouble rising following    Currently in Pain? Yes   Pain Score 6    Pain Location Leg   Pain Orientation Right;Left   Pain Descriptors / Indicators Sore   Pain  Type Chronic pain   Pain Onset More than a month ago                         [facility] Adult PT Treatment/Exercise - 04/26/16 0001      Lumbar Exercises: Machines for Strengthening   Leg Press Seat 9 80# bilateral, single leg 40# 25x each     Knee/Hip Exercises: Stretches   Active Hamstring Stretch Right;Left;5 reps   Active Hamstring Stretch Limitations on step    Gastroc Stretch Both;10 seconds  with neck extension   Other Knee/Hip Stretches stair hip flexor stretch 5x right/left     Knee/Hip Exercises: Aerobic   Stationary Bike 5 minutes L1   Nustep L3 10 min seat 11  Therapist present to discuss treatment     Knee/Hip Exercises: Standing   Forward Step Up Right;Left;1 set;5 reps   Rebounder Weight shifting, marching, single leg balance  x1 minute each   Walking with Sports Cord forward #15; back #20     Knee/Hip Exercises: Seated   Other Seated Knee/Hip Exercises sit to stand from high table 20x     Modalities   Modalities Electrical Stimulation;Moist Heat     Moist Heat Therapy   Number Minutes  Moist Heat 15 Minutes   Moist Heat Location Knee  Bil calves     Electrical Stimulation   Electrical Stimulation Location Bil calves   Electrical Stimulation Action IFC   Electrical Stimulation Parameters 35 ma   Electrical Stimulation Goals Pain                  PT Short Term Goals - 04/23/16 1243      PT SHORT TERM GOAL #1   Title be independent in initial HEP  02/13/16   Status Achieved     PT SHORT TERM GOAL #2   Title perform TUG in < or = to 20 seconds to improve balance   Status Achieved     PT SHORT TERM GOAL #3   Title The patient will have improved hip flexor muscle length to 5 degrees and HS length to 55 degrees needed for improved stride length with ambulation   Status Achieved     PT SHORT TERM GOAL #4   Title BERG balance test improved from 29/56 to 33/56 indicating improved safety for standing ADLs like grooming and  dresssing   Status Achieved           PT Long Term Goals - 04/23/16 1243      PT LONG TERM GOAL #1   Title be independent in advanced HEP for further improvements in strength, ROM and balance  03/12/16   Time 8   Period Weeks   Status On-going     PT LONG TERM GOAL #2   Title reduce FOTO to < or = to 46% limitation indicating improved function with ADLs   Time 8   Period Weeks   Status On-going     PT LONG TERM GOAL #3   Title perform TUG in < or = to 18 seconds to reduce risk of falls   Time 8   Period Weeks   Status On-going     PT LONG TERM GOAL #4   Title Improved hip and knee strength to at least 4-/5 for greater ease with rising from a chair and  with standing > 10 min    Time 8   Period Weeks   Status On-going     PT LONG TERM GOAL #5   Title The patient will be able to ambulate 400 feet in six minutes needed for shorter distance community ambulation with appropriate assistive device   Status Achieved     PT LONG TERM GOAL #6   Title BERG balance score improved to 37/56 indicating improved safety with walking and standing dynamic activities   Status Achieved     PT LONG TERM GOAL #7   Title Able to walk 650 feet in 6 minutes needed for travelling    Status Achieved     PT LONG TERM GOAL #8   Title Timed up and go test improved to 17 sec indicating improved agility with sit to stand and gait speed   Time 8   Period Weeks   Status On-going               Plan - 04/26/16 0913    Clinical Impression Statement Pt having some increase LE tightness today but able to tolerate all exercises well. Pt has not tried using his TENs unit at home yet. Pt continues to have difficulty with sit to stand from lower surfaces needing excessive UE and leaning compensations. Pt doing well with stairs and single leg stance. Pt will contineu to benefit  from skilled thearpy for strengthening and endurance.    Rehab Potential Good   PT Frequency 2x / week   PT Duration 8  weeks   PT Treatment/Interventions ADLs/Self Care Home Management;Moist Heat;Electrical Stimulation;Therapeutic exercise;Neuromuscular re-education;Patient/family education;Manual techniques   PT Next Visit Plan Core and LE strengthening;  leg press increase weight by 10#; ambulation distance; balance   Consulted and Agree with Plan of Care Patient      Patient will benefit from skilled therapeutic intervention in order to improve the following deficits and impairments:  Pain, Abnormal gait, Decreased balance, Decreased activity tolerance, Decreased range of motion, Decreased strength, Decreased mobility, Impaired flexibility, Difficulty walking  Visit Diagnosis: Muscle weakness (generalized)  Difficulty in walking, not elsewhere classified  Repeated falls  Arthralgia of right lower leg     Problem List Patient Active Problem List   Diagnosis Date Noted  . Leg edema, right 02/20/2015  . BPH (benign prostatic hypertrophy) 12/09/2014  . Chronic radicular low back pain 08/06/2012  . COPD (chronic obstructive pulmonary disease) ([place]) 04/21/2012  . Hyponatremia 03/29/2012  . Localized osteoarthrosis not specified whether primary or secondary, lower leg 06/20/2010  . VARICOSE VEINS LOWER EXTREMITIES W/INFLAMMATION 02/12/2010  . [place] LOCALIZED OSTEOARTHROSIS INVOLVING HAND 10/16/2009  . DUPUYTREN'S CONTRACTURE, RIGHT 07/04/2009  . DEGENERATIVE DISC DISEASE, LUMBOSACRAL SPINE W/RADICULOPATHY 06/19/2009  . CARCINOMA, BASAL CELL 02/07/2009  . ANEMIA OF CHRONIC DISEASE 02/07/2009  . BURSITIS, RIGHT SHOULDER 10/07/2008  . CATARACT ASSOCIATED WITH OTHER SYNDROMES 08/01/2008  . DRY SKIN 08/01/2008  . CERUMEN IMPACTION, BILATERAL 03/01/2008  . CRAMP IN LIMB 01/18/2008  . DEPRESSION, SITUATIONAL, ACUTE 12/28/2007  . CONSTIPATION, DRUG INDUCED 12/28/2007  . POLYNEUROPATHY OTHER DISEASES CLASSIFIED ELSW 09/24/2007  . WEIGHT LOSS, ABNORMAL 01/07/2007  . Hyperlipidemia 09/22/2006  .  Essential hypertension 09/22/2006  . GERD 09/22/2006  . OSTEOARTHRITIS 09/22/2006    Mikle Bosworth PTA 04/26/2016, 9:20 AM  [facility] [address] [place], [place], [address] Phone: [phone]   Fax:  [phone]  Name: Darin [name] MRN: [id number] Date of Birth: 1931-10-09

## 2016-04-30 ENCOUNTER — Ambulatory Visit: Payer: Medicare Other | Admitting: Physical Therapy

## 2016-04-30 DIAGNOSIS — R262 Difficulty in walking, not elsewhere classified: Secondary | ICD-10-CM

## 2016-04-30 DIAGNOSIS — M25561 Pain in right knee: Secondary | ICD-10-CM | POA: Diagnosis not present

## 2016-04-30 DIAGNOSIS — M6281 Muscle weakness (generalized): Secondary | ICD-10-CM | POA: Diagnosis not present

## 2016-04-30 DIAGNOSIS — R296 Repeated falls: Secondary | ICD-10-CM | POA: Diagnosis not present

## 2016-04-30 NOTE — Therapy (Signed)
[facility] [address], [place], [place], [address] Phone: [phone]   Fax:  [phone]  Physical Therapy Treatment  Patient Details  Name: Darin [name] MRN: [id number] Date of Birth: 05-17-1931 Referring Provider: Dr. Elease Hashimoto  Encounter Date: 04/30/2016      PT End of Session - 04/30/16 0844    Visit Number 29   Number of Visits 30   Date for PT Re-Evaluation June 01, 2016   Authorization Type G-codes at 25   PT Start Time 0801   PT Stop Time 0846   PT Time Calculation (min) 45 min   Activity Tolerance Patient tolerated treatment well      Past Medical History:  Diagnosis Date  . Arthritis   . GERD (gastroesophageal reflux disease)   . H/O hiatal hernia    "FOR YEARS" " NO PROBLEM"  . Hyperlipidemia   . Hypertension   . Knee pain   . Neuropathy (HCC)    PERIPHERAL   . Osteoporosis   . PMR (polymyalgia rheumatica) (HCC)   . Polymyalgia ([place]) 5 YEARS    Past Surgical History:  Procedure Laterality Date  . CARPAL TUNNEL RELEASE    . CATARACT EXTRACTION     right  . INGUINAL HERNIA REPAIR  10/14/2011   Procedure: HERNIA REPAIR INGUINAL ADULT;  Surgeon: [name], MD;  Location: [place];  Service: General;  Laterality: Right;  Right Inguinal Hernia Repair with Mesh  . TONSILLECTOMY    . VARICOSE VEIN SURGERY      There were no vitals filed for this visit.      Subjective Assessment - 04/30/16 0804    Subjective Pain about as usual.  My feet hurt a lot at night.  When I first stand up I have a tough time walking but then it gets better.  I have used my TENS unit.     Pain Score 6    Pain Location Leg   Pain Orientation Right;Left   Pain Type Chronic pain   Pain Onset More than a month ago   Aggravating Factors  night time;  rising            [facility] PT Assessment - 04/30/16 0001      6 Minute Walk- Baseline   Perceived Rate of Exertion ([place]) 12-     6 minute walk test results     Aerobic Endurance Distance Walked 760     [place] Balance Test   Sit to Stand Able to stand  independently using hands   Standing Unsupported Able to stand safely 2 minutes   Sitting with Back Unsupported but Feet Supported on Floor or Stool Able to sit safely and securely 2 minutes   Stand to Sit Controls descent by using hands   Transfers Able to transfer safely, definite need of hands   Standing Unsupported with Eyes Closed Able to stand 10 seconds safely   Standing Ubsupported with Feet Together Needs help to attain position and unable to hold for 15 seconds   From Standing, Reach Forward with Outstretched Arm Can reach forward >12 cm safely (5")   From Standing Position, Pick up Object from Floor Able to pick up shoe safely and easily   From Standing Position, Turn to Look Behind Over each Shoulder Looks behind one side only/other side shows less weight shift   Turn 360 Degrees Able to turn 360 degrees safely but slowly   Standing Unsupported, Alternately Place Feet on Step/Stool Able to  stand independently and complete 8 steps >20 seconds   Standing Unsupported, One Foot in Front Able to take small step independently and hold 30 seconds   Standing on One Leg Tries to lift leg/unable to hold 3 seconds but remains standing independently   Total Score 39                     [facility] Adult PT Treatment/Exercise - 04/30/16 0001      Knee/Hip Exercises: Aerobic   Nustep L3 10 min seat 11  Therapist present to discuss treatment     Knee/Hip Exercises: Standing   Hip ADduction AROM;Right;Left;2 sets;15 reps   Hip Extension Right;Left;2 sets;15 reps;Knee straight   Extension Limitations red band      Bike L2 6 min            PT Short Term Goals - 04/30/16 1432      PT SHORT TERM GOAL #1   Title be independent in initial HEP  02/13/16   Status Achieved     PT SHORT TERM GOAL #2   Title perform TUG in < or = to 20 seconds to improve balance   Status Achieved      PT SHORT TERM GOAL #3   Title The patient will have improved hip flexor muscle length to 5 degrees and HS length to 55 degrees needed for improved stride length with ambulation   Status Achieved     PT SHORT TERM GOAL #4   Title BERG balance test improved from 29/56 to 33/56 indicating improved safety for standing ADLs like grooming and dresssing   Status Achieved           PT Long Term Goals - 04/30/16 1432      PT LONG TERM GOAL #1   Title be independent in advanced HEP for further improvements in strength, ROM and balance  03/12/16   Time 8   Period Weeks   Status On-going     PT LONG TERM GOAL #2   Title reduce FOTO to < or = to 46% limitation indicating improved function with ADLs   Time 8   Period Weeks   Status On-going     PT LONG TERM GOAL #3   Title perform TUG in < or = to 18 seconds to reduce risk of falls   Period Weeks   Status On-going     PT LONG TERM GOAL #4   Title Improved hip and knee strength to at least 4-/5 for greater ease with rising from a chair and  with standing > 10 min    Time 8   Period Weeks   Status On-going     PT LONG TERM GOAL #5   Title The patient will be able to ambulate 400 feet in six minutes needed for shorter distance community ambulation with appropriate assistive device   Status Achieved     PT LONG TERM GOAL #6   Title BERG balance score improved to 37/56 indicating improved safety with walking and standing dynamic activities   Status Achieved     PT LONG TERM GOAL #7   Title Able to walk 650 feet in 6 minutes needed for travelling    Status Achieved     PT LONG TERM GOAL #8   Title Timed up and go test improved to 17 sec indicating improved agility with sit to stand and gait speed   Time 8   Period Weeks   Status On-going  Plan - 04/30/16 0845    Clinical Impression Statement The patient's BERG balance test has improved from 37/56 to 39/56 but still at signficant risk 80% of falls.   Discussed results of test with patient which also suggest the need for walker for ambulation.  Patient states he plans on taking 2 walking sticks with him on his travels.  His six minute walk test is inchanged from 3 weeks ago and is <10th percentile for his age.  Therapist closely monitoring response during treatment session and providing CGA for safety at times with balance challenges.     PT Next Visit Plan Check TUG;  MMT; check remaining goals;  FOTO      Patient will benefit from skilled therapeutic intervention in order to improve the following deficits and impairments:     Visit Diagnosis: Muscle weakness (generalized)  Difficulty in walking, not elsewhere classified  Repeated falls  Arthralgia of right lower leg     Problem List Patient Active Problem List   Diagnosis Date Noted  . Leg edema, right 02/20/2015  . BPH (benign prostatic hypertrophy) 12/09/2014  . Chronic radicular low back pain 08/06/2012  . COPD (chronic obstructive pulmonary disease) ([place]) 04/21/2012  . Hyponatremia 03/29/2012  . Localized osteoarthrosis not specified whether primary or secondary, lower leg 06/20/2010  . VARICOSE VEINS LOWER EXTREMITIES W/INFLAMMATION 02/12/2010  . [place] LOCALIZED OSTEOARTHROSIS INVOLVING HAND 10/16/2009  . DUPUYTREN'S CONTRACTURE, RIGHT 07/04/2009  . DEGENERATIVE DISC DISEASE, LUMBOSACRAL SPINE W/RADICULOPATHY 06/19/2009  . CARCINOMA, BASAL CELL 02/07/2009  . ANEMIA OF CHRONIC DISEASE 02/07/2009  . BURSITIS, RIGHT SHOULDER 10/07/2008  . CATARACT ASSOCIATED WITH OTHER SYNDROMES 08/01/2008  . DRY SKIN 08/01/2008  . CERUMEN IMPACTION, BILATERAL 03/01/2008  . CRAMP IN LIMB 01/18/2008  . DEPRESSION, SITUATIONAL, ACUTE 12/28/2007  . CONSTIPATION, DRUG INDUCED 12/28/2007  . POLYNEUROPATHY OTHER DISEASES CLASSIFIED ELSW 09/24/2007  . WEIGHT LOSS, ABNORMAL 01/07/2007  . Hyperlipidemia 09/22/2006  . Essential hypertension 09/22/2006  . GERD 09/22/2006  . OSTEOARTHRITIS  09/22/2006   Ruben Im, PT 04/30/16 2:38 PM Phone: [phone] Fax: [phone]  Alvera Singh 04/30/2016, 2:36 PM  [facility] [address] [place], [place], [address] Phone: [phone]   Fax:  [phone]  Name: Darin [name] MRN: [id number] Date of Birth: 1931/10/04

## 2016-05-03 ENCOUNTER — Ambulatory Visit: Payer: Medicare Other | Admitting: Physical Therapy

## 2016-05-03 DIAGNOSIS — M6281 Muscle weakness (generalized): Secondary | ICD-10-CM | POA: Diagnosis not present

## 2016-05-03 DIAGNOSIS — R296 Repeated falls: Secondary | ICD-10-CM | POA: Diagnosis not present

## 2016-05-03 DIAGNOSIS — R262 Difficulty in walking, not elsewhere classified: Secondary | ICD-10-CM

## 2016-05-03 DIAGNOSIS — M25561 Pain in right knee: Secondary | ICD-10-CM

## 2016-05-03 NOTE — Therapy (Signed)
[facility] [address] [place], [place], [address] Phone: [phone]   Fax:  [phone]  Physical Therapy Treatment/Discharge Summary  Patient Details  Name: Darin [name] MRN: [id number] Date of Birth: 1931/08/18 Referring Provider: Dr. Elease Hashimoto  Encounter Date: May 09, 2016      PT End of Session - 09-May-2016 0829    Visit Number 30   Number of Visits 30   Date for PT Re-Evaluation May 09, 2016   Authorization Type G-codes at 63   PT Start Time 0753   PT Stop Time 0840   PT Time Calculation (min) 47 min   Activity Tolerance Patient tolerated treatment well      Past Medical History:  Diagnosis Date  . Arthritis   . GERD (gastroesophageal reflux disease)   . H/O hiatal hernia    "FOR YEARS" " NO PROBLEM"  . Hyperlipidemia   . Hypertension   . Knee pain   . Neuropathy (HCC)    PERIPHERAL   . Osteoporosis   . PMR (polymyalgia rheumatica) (HCC)   . Polymyalgia ([place]) 5 YEARS    Past Surgical History:  Procedure Laterality Date  . CARPAL TUNNEL RELEASE    . CATARACT EXTRACTION     right  . INGUINAL HERNIA REPAIR  10/14/2011   Procedure: HERNIA REPAIR INGUINAL ADULT;  Surgeon: [name], MD;  Location: [facility];  Service: General;  Laterality: Right;  Right Inguinal Hernia Repair with Mesh  . TONSILLECTOMY    . VARICOSE VEIN SURGERY      There were no vitals filed for this visit.      Subjective Assessment - 09-May-2016 0758    Subjective Leaving today for trip to [place] and [place].  My left calf gave me a lot of trouble during the night but some better now.     Currently in Pain? Yes   Pain Score 5    Pain Location Leg   Pain Orientation Right;Left   Pain Type Chronic pain            [facility] PT Assessment - May 09, 2016 0001      Observation/Other Assessments   Focus on Therapeutic Outcomes (FOTO)  52% limitation     Strength   Right Hip Flexion 4/5   Right Hip Extension 4-/5   Right Hip  ABduction 3+/5   Left Hip Flexion 4/5   Left Hip Extension 4-/5   Left Hip ABduction 3-/5   Right Knee Flexion 4/5   Right Knee Extension 4/5   Left Knee Flexion 4-/5   Left Knee Extension 4-/5   Right Ankle Dorsiflexion 4/5   Right Ankle Plantar Flexion 4-/5   Right Ankle Eversion 4/5   Left Ankle Dorsiflexion 4-/5   Left Ankle Plantar Flexion 4-/5     Flexibility   Soft Tissue Assessment /Muscle Length --  HS left 70, right 75 degrees     Ambulation/Gait   Gait Comments wide stance;  needs significant bilateral UE use for sit to stand from standard height chair     6 minute walk test results    Aerobic Endurance Distance Walked 760     Berg Balance Test   [place] comment: 39/56 moderate risk for falls     Timed Up and Go Test   Manual TUG (seconds) 16.1                     [facility] Adult PT Treatment/Exercise - 05/09/2016 0001  Self-Care   Self-Care Other Self-Care Comments   Other Self-Care Comments  Discussed home TENs mode and settings for pain control     Lumbar Exercises: Machines for Strengthening   Leg Press Seat 9 90#, single leg 50# 50x each     Knee/Hip Exercises: Stretches   Active Hamstring Stretch Right;Left;5 reps   Active Hamstring Stretch Limitations on step    Other Knee/Hip Stretches stair hip flexor stretch 5x right/left   Other Knee/Hip Stretches calf stretch on step 3x right/left     Knee/Hip Exercises: Aerobic   Nustep L3 10 min seat 11  Therapist present to discuss treatment     Marching in place with UE assist 1 minute  Bike 6 min level 1.              PT Short Term Goals - 05/03/16 0827      PT SHORT TERM GOAL #1   Title be independent in initial HEP  02/13/16   Status Achieved     PT SHORT TERM GOAL #2   Title perform TUG in < or = to 20 seconds to improve balance   Status Achieved     PT SHORT TERM GOAL #3   Title The patient will have improved hip flexor muscle length to 5 degrees and HS length to 55  degrees needed for improved stride length with ambulation   Status Achieved     PT SHORT TERM GOAL #4   Title BERG balance test improved from 29/56 to 33/56 indicating improved safety for standing ADLs like grooming and dresssing   Status Achieved           PT Long Term Goals - 05/03/16 0820      PT LONG TERM GOAL #1   Title be independent in advanced HEP for further improvements in strength, ROM and balance  03/12/16   Status Achieved     PT LONG TERM GOAL #2   Title reduce FOTO to < or = to 46% limitation indicating improved function with ADLs   Status Partially Met     PT LONG TERM GOAL #3   Title perform TUG in < or = to 18 seconds to reduce risk of falls   Status Achieved     PT LONG TERM GOAL #4   Title Improved hip and knee strength to at least 4-/5 for greater ease with rising from a chair and  with standing > 10 min    Status Partially Met     PT LONG TERM GOAL #5   Title The patient will be able to ambulate 400 feet in six minutes needed for shorter distance community ambulation with appropriate assistive device   Status Achieved     PT LONG TERM GOAL #6   Title BERG balance score improved to 37/56 indicating improved safety with walking and standing dynamic activities   Status Achieved     PT LONG TERM GOAL #7   Title Able to walk 650 feet in 6 minutes needed for travelling    Status Achieved     PT LONG TERM GOAL #8   Title Timed up and go test improved to 17 sec indicating improved agility with sit to stand and gait speed   Status Achieved               Plan - 05/03/16 0834    Clinical Impression Statement The patient has made good progress with improvements in balance with BERG balance score improving to 39/56 (  still at moderate risk of falls but improved from very high risk at initial evaluation).  His 6 min walk test has improved to 760 feet although limited compared to normals his age < 10th percentile.  He continues to use a wide based gait to  compensate for balance impairments and LE weakness especially hip abductors 3-/5 (left weaker than right).  LE weakness also contributes to difficulty rising from a chair without signficant use of UEs to assist.   The patient is travelling to [place] and [place] and states, "I wouldn't be able to take this trip without this PT."  Discussed use of bilateral assistive device for safety on his trip.  Discussed seated stretching and ROM ex for his long flights.  He has purchased a home TENS unit to take for the trip for leg pain control.  Recommend discharge from PT at this time with majority of goals met.        Patient will benefit from skilled therapeutic intervention in order to improve the following deficits and impairments:     Visit Diagnosis: Muscle weakness (generalized)  Difficulty in walking, not elsewhere classified  Repeated falls  Arthralgia of right lower leg  PHYSICAL THERAPY DISCHARGE SUMMARY  Visits from Start of Care: 30  Current functional level related to goals / functional outcomes: See clinical impression statement above.   Remaining deficits: As above   Education / Equipment: Comprehensive HEP;  Home TENS; theraband  Plan: Patient agrees to discharge.  Patient goals were partially met. Patient is being discharged due to meeting the stated rehab goals.  ?????          G-Codes - 09-May-2016 0850    Functional Assessment Tool Used Clinical, strength, sit to stand   Functional Limitation Mobility: Walking and moving around   Mobility: Walking and Moving Around Goal Status [phone]) At least 40 percent but less than 60 percent impaired, limited or restricted   Mobility: Walking and Moving Around Discharge Status [phone]) At least 40 percent but less than 60 percent impaired, limited or restricted      Problem List Patient Active Problem List   Diagnosis Date Noted  . Leg edema, right 02/20/2015  . BPH (benign prostatic hypertrophy) 12/09/2014  . Chronic radicular  low back pain 08/06/2012  . COPD (chronic obstructive pulmonary disease) ([place]) 04/21/2012  . Hyponatremia 03/29/2012  . Localized osteoarthrosis not specified whether primary or secondary, lower leg 06/20/2010  . VARICOSE VEINS LOWER EXTREMITIES W/INFLAMMATION 02/12/2010  . [place] LOCALIZED OSTEOARTHROSIS INVOLVING HAND 10/16/2009  . DUPUYTREN'S CONTRACTURE, RIGHT 07/04/2009  . DEGENERATIVE DISC DISEASE, LUMBOSACRAL SPINE W/RADICULOPATHY 06/19/2009  . CARCINOMA, BASAL CELL 02/07/2009  . ANEMIA OF CHRONIC DISEASE 02/07/2009  . BURSITIS, RIGHT SHOULDER 10/07/2008  . CATARACT ASSOCIATED WITH OTHER SYNDROMES 08/01/2008  . DRY SKIN 08/01/2008  . CERUMEN IMPACTION, BILATERAL 03/01/2008  . CRAMP IN LIMB 01/18/2008  . DEPRESSION, SITUATIONAL, ACUTE 12/28/2007  . CONSTIPATION, DRUG INDUCED 12/28/2007  . POLYNEUROPATHY OTHER DISEASES CLASSIFIED ELSW 09/24/2007  . WEIGHT LOSS, ABNORMAL 01/07/2007  . Hyperlipidemia 09/22/2006  . Essential hypertension 09/22/2006  . GERD 09/22/2006  . OSTEOARTHRITIS 09/22/2006   Ruben Im, PT May 09, 2016 9:00 AM Phone: [phone] Fax: [phone]  Alvera Singh 05/09/2016, 8:58 AM  [facility] [address], [place], [place], [address] Phone: [phone]   Fax:  [phone]  Name: Marquise Wicke MRN: [id number] Date of Birth: [birth year]

## 2016-05-28 ENCOUNTER — Telehealth: Payer: Self-pay | Admitting: *Deleted

## 2016-05-28 DIAGNOSIS — R2681 Unsteadiness on feet: Secondary | ICD-10-CM

## 2016-05-28 NOTE — Telephone Encounter (Signed)
Patient requests a referral for PT.  Okay per Dr Elease Hashimoto.

## 2016-06-11 ENCOUNTER — Ambulatory Visit: Payer: Medicare Other | Attending: Family Medicine | Admitting: Physical Therapy

## 2016-06-11 DIAGNOSIS — R296 Repeated falls: Secondary | ICD-10-CM | POA: Insufficient documentation

## 2016-06-11 DIAGNOSIS — M25552 Pain in left hip: Secondary | ICD-10-CM | POA: Insufficient documentation

## 2016-06-11 DIAGNOSIS — M6281 Muscle weakness (generalized): Secondary | ICD-10-CM | POA: Diagnosis not present

## 2016-06-11 DIAGNOSIS — R279 Unspecified lack of coordination: Secondary | ICD-10-CM | POA: Insufficient documentation

## 2016-06-11 NOTE — Therapy (Signed)
[facility] [address] [place], [place], [address] Phone: [phone]   Fax:  [phone]  Physical Therapy Evaluation  Patient Details  Name: Darin [name] MRN: [id number] Date of Birth: May 28, 1931 Referring Provider: Dr. Elease Hashimoto  Encounter Date: 06/11/2016      PT End of Session - 06/11/16 0912    Visit Number 1   Date for PT Re-Evaluation 08-16-2016   Authorization Type G codes visit 10;  KX now (previous PT this year)   PT Start Time 0840   PT Stop Time 0925   PT Time Calculation (min) 45 min   Activity Tolerance Patient tolerated treatment well      Past Medical History:  Diagnosis Date  . Arthritis   . GERD (gastroesophageal reflux disease)   . H/O hiatal hernia    "FOR YEARS" " NO PROBLEM"  . Hyperlipidemia   . Hypertension   . Knee pain   . Neuropathy (HCC)    PERIPHERAL   . Osteoporosis   . PMR (polymyalgia rheumatica) (HCC)   . Polymyalgia ([place]) 5 YEARS    Past Surgical History:  Procedure Laterality Date  . CARPAL TUNNEL RELEASE    . CATARACT EXTRACTION     right  . INGUINAL HERNIA REPAIR  10/14/2011   Procedure: HERNIA REPAIR INGUINAL ADULT;  Surgeon: [name], MD;  Location: [facility];  Service: General;  Laterality: Right;  Right Inguinal Hernia Repair with Mesh  . TONSILLECTOMY    . VARICOSE VEIN SURGERY      There were no vitals filed for this visit.       Subjective Assessment - 06/11/16 0845    Subjective Returned from trip to [place] and [place].  I spent a lot of time in a wheelchair.  Reports there were few handrails so he  needed assistance to go up and down the steps, even the shallow ones.    Fell backwards while he was there while trying to go down shallow steps landing on his bottom.   Used a wheelchair in the airports.  Legs, ankles hurt and my back to some extent.  Left posterior hip pain from fall.       Currently in Pain? Yes   Pain Score 6    Pain Location Hip    Pain Orientation Left   Pain Type Chronic pain   Pain Onset More than a month ago   Pain Frequency Constant   Aggravating Factors  steps cause gait instability            [facility] PT Assessment - 06/11/16 0001      Assessment   Medical Diagnosis gait instability   Referring Provider Dr. Elease Hashimoto   Onset Date/Surgical Date --  6 months   Hand Dominance Right   Next MD Visit as needed   Prior Therapy fall/winter     Precautions   Precautions None     Restrictions   Weight Bearing Restrictions No     Balance Screen   Has the patient fallen in the past 6 months Yes   How many times? 1   Has the patient had a decrease in activity level because of a fear of falling?  No   Is the patient reluctant to leave their home because of a fear of falling?  No     Home Environment   Living Environment Private residence   Living Arrangements Alone   Type of [place] Access Level  entry   Albert Lea - 2 wheels;Kasandra Knudsen - single point     Prior Function   Vocation Retired   Leisure traveling going to [place] in June     Observation/Other Assessments   Other Surveys  Other Surveys   Activities of Balance Confidence Scale (ABC Scale)  69% balance confidence     Strength   Right Hip Flexion 4/5   Right Hip Extension 4-/5   Right Hip ABduction 3+/5   Left Hip Flexion 4/5   Left Hip Extension 4-/5   Left Hip ABduction 3-/5   Right Knee Flexion 4/5   Right Knee Extension 4/5   Left Knee Flexion 4-/5   Left Knee Extension 4-/5   Right Ankle Dorsiflexion 4/5   Right Ankle Eversion 4/5   Left Ankle Dorsiflexion 4-/5   Left Ankle Plantar Flexion 4-/5     Flexibility   Soft Tissue Assessment /Muscle Length --  decreased HS and hip flexor lengths bilaterally      Berg Balance Test   Sit to Stand Able to stand  independently using hands   Standing Unsupported Able to stand safely 2 minutes   Sitting with Back Unsupported but Feet  Supported on Floor or Stool Able to sit safely and securely 2 minutes   Stand to Sit Controls descent by using hands   Transfers Able to transfer safely, definite need of hands   Standing Unsupported with Eyes Closed Able to stand 10 seconds with supervision   Standing Ubsupported with Feet Together Needs help to attain position and unable to hold for 15 seconds   From Standing, Reach Forward with Outstretched Arm Can reach forward >12 cm safely (5")   From Standing Position, Pick up Object from Floor Able to pick up shoe, needs supervision   From Standing Position, Turn to Look Behind Over each Shoulder Looks behind one side only/other side shows less weight shift   Turn 360 Degrees Able to turn 360 degrees safely but slowly   Standing Unsupported, Alternately Place Feet on Step/Stool Able to complete 4 steps without aid or supervision   Standing Unsupported, One Foot in Front Able to take small step independently and hold 30 seconds   Standing on One Leg Tries to lift leg/unable to hold 3 seconds but remains standing independently   Total Score 36     Dynamic Gait Index   Level Surface Mild Impairment   Change in Gait Speed Mild Impairment   Gait with Horizontal Head Turns Moderate Impairment   Gait with Vertical Head Turns Moderate Impairment   Gait and Pivot Turn Moderate Impairment   Step Over Obstacle Mild Impairment   Step Around Obstacles Mild Impairment   Steps Moderate Impairment   Total Score 12     Timed Up and Go Test   Manual TUG (seconds) 15                             PT Short Term Goals - 06/11/16 1249      PT SHORT TERM GOAL #1   Title be independent in initial HEP  07/09/16   Time 4   Period Weeks   Status New     PT SHORT TERM GOAL #2   Title Activities-Specific Balance Confidence (ABC) Scale improved to 74% indicating improved confidence in his steadiness with functional activities at home and in the community   Time 4  Period Weeks    Status New     PT SHORT TERM GOAL #3   Title The patient will have improved hip flexor muscle length to 5 degrees and HS length to 55 degrees needed for improved stride length with ambulation   Time 4   Period Weeks   Status New     PT SHORT TERM GOAL #4   Title BERG balance test improved  to 38/56 indicating improved safety for standing ADLs like grooming and dresssing   Time 4   Period Weeks   Status New     PT SHORT TERM GOAL #5   Title Dynamic Gait Index improved to 14 indicating improved balance with dynamic functional activities like walking and climbing stairs   Time 4   Period Weeks   Status New           PT Long Term Goals - 06/11/16 1253      PT LONG TERM GOAL #1   Title be independent in advanced HEP for further improvements in strength, ROM and balance  08/06/16   Time 8   Period Weeks   Status New     PT LONG TERM GOAL #2   Title Activities Specific Balance Confidence Scale improved to 79% self confidence in his steadiness with functional activities   Time 8   Period Weeks   Status New     PT LONG TERM GOAL #3   Title Dynamic Gait Index improved to 16 indicating improved balance with functional activities like stairs    Time 8   Period Weeks   Status New     PT LONG TERM GOAL #4   Title Improved hip and knee strength to at least 4-/5 for greater ease with rising from a chair and  with standing > 10 min    Time 8   Period Weeks   Status New     PT LONG TERM GOAL #5   Title The patient will be able to ambulate 800 feet in six minutes needed for shorter distance community ambulation for traveling   Time 8   Period Weeks   Status New     PT LONG TERM GOAL #6   Title BERG balance score improved to 40/56 indicating improved safety with walking and standing dynamic activities   Time 8   Period Weeks   Status New     PT LONG TERM GOAL #7   Title ...     PT LONG TERM GOAL #8   Title .Marland KitchenMarland Kitchen               Plan - 06/11/16 0938    Clinical  Impression Statement The patient is an 83 year old with complaints of gait instability.   Recently returned from trip to [place] and [place].  States he  spent a lot of time in a wheelchair secondary to lack of balance and gait endurance.   Reports there were few handrails so he  needed assistance to go up and down the steps, even the shallow ones.    Fell backwards while he was there while trying to go down shallow steps landing on his bottom.   Used a wheelchair in the airports.  Used a single walking stick for short distance ambulation.   Legs, ankles hurt and my back to some extent.  Left posterior hip pain from fall.    The patient has decreased strength especially in hip abuctors and extensors.  Ambulates with wide based shuffled gait.  Per balance tests the patient is a high fall risk on all testing.  He would benefit from PT to address these deficits.  The patient is of  moderate complexity secondary to evolving status with a recent fall , advanced age, lack of home support and co-morbidities including L. DDD, poly neuropathy and HTN which could affect progress with rehab.     Rehab Potential Good   Clinical Impairments Affecting Rehab Potential high risk of falls   PT Frequency 2x / week   PT Duration 8 weeks   PT Treatment/Interventions ADLs/Self Care Home Management;Electrical Stimulation;Moist Heat;Therapeutic exercise;Therapeutic activities;Balance training;Neuromuscular re-education;Patient/family education;Taping;Manual techniques   PT Next Visit Plan Do 6 min walk test;  Nu-Step;  LE strengthening;  standing balance;  KX every visit       Patient will benefit from skilled therapeutic intervention in order to improve the following deficits and impairments:  Decreased activity tolerance, Difficulty walking, Decreased strength, Pain, Decreased endurance, Abnormal gait, Decreased balance, Decreased coordination  Visit Diagnosis: Unspecified lack of coordination - Plan: PT plan of care  cert/re-cert  Muscle weakness (generalized) - Plan: PT plan of care cert/re-cert  Pain in left hip - Plan: PT plan of care cert/re-cert  Repeated falls - Plan: PT plan of care cert/re-cert      G-Codes - 09/81/19 1257    Functional Assessment Tool Used (Outpatient Only) Clinical judgement;  ABC scale   Functional Limitation Mobility: Walking and moving around   Mobility: Walking and Moving Around Current Status ([id number]) At least 40 percent but less than 60 percent impaired, limited or restricted   Mobility: Walking and Moving Around Goal Status [phone]) At least 20 percent but less than 40 percent impaired, limited or restricted       Problem List Patient Active Problem List   Diagnosis Date Noted  . Leg edema, right 02/20/2015  . BPH (benign prostatic hypertrophy) 12/09/2014  . Chronic radicular low back pain 08/06/2012  . COPD (chronic obstructive pulmonary disease) ([place]) 04/21/2012  . Hyponatremia 03/29/2012  . Localized osteoarthrosis not specified whether primary or secondary, lower leg 06/20/2010  . VARICOSE VEINS LOWER EXTREMITIES W/INFLAMMATION 02/12/2010  . [place] LOCALIZED OSTEOARTHROSIS INVOLVING HAND 10/16/2009  . DUPUYTREN'S CONTRACTURE, RIGHT 07/04/2009  . DEGENERATIVE DISC DISEASE, LUMBOSACRAL SPINE W/RADICULOPATHY 06/19/2009  . CARCINOMA, BASAL CELL 02/07/2009  . ANEMIA OF CHRONIC DISEASE 02/07/2009  . BURSITIS, RIGHT SHOULDER 10/07/2008  . CATARACT ASSOCIATED WITH OTHER SYNDROMES 08/01/2008  . DRY SKIN 08/01/2008  . CERUMEN IMPACTION, BILATERAL 03/01/2008  . CRAMP IN LIMB 01/18/2008  . DEPRESSION, SITUATIONAL, ACUTE 12/28/2007  . CONSTIPATION, DRUG INDUCED 12/28/2007  . POLYNEUROPATHY OTHER DISEASES CLASSIFIED ELSW 09/24/2007  . WEIGHT LOSS, ABNORMAL 01/07/2007  . Hyperlipidemia 09/22/2006  . Essential hypertension 09/22/2006  . GERD 09/22/2006  . OSTEOARTHRITIS 09/22/2006   Ruben Im, PT 06/11/16 1:03 PM Phone: [phone] Fax:  [phone]  Alvera Singh 06/11/2016, 1:02 PM  [facility] [address], [place], [place], [address] Phone: [phone]   Fax:  [phone]  Name: Darin [name] MRN: [id number] Date of Birth: [birth year]

## 2016-06-11 NOTE — Therapy (Signed)
[facility] [address], [place], [place], [address] Phone: [phone]   Fax:  [phone]  Physical Therapy Evaluation  Patient Details  Name: Darin [name] MRN: [id number] Date of Birth: 1931/07/22 Referring Provider: Dr. Elease Hashimoto  Encounter Date: 06/11/2016      PT End of Session - 06/11/16 0912    Visit Number 1   Date for PT Re-Evaluation 2016/08/12   Authorization Type G codes visit 10;  KX now (previous PT this year)   PT Start Time 0840   PT Stop Time 0925   PT Time Calculation (min) 45 min   Activity Tolerance Patient tolerated treatment well      Past Medical History:  Diagnosis Date  . Arthritis   . GERD (gastroesophageal reflux disease)   . H/O hiatal hernia    "FOR YEARS" " NO PROBLEM"  . Hyperlipidemia   . Hypertension   . Knee pain   . Neuropathy (HCC)    PERIPHERAL   . Osteoporosis   . PMR (polymyalgia rheumatica) (HCC)   . Polymyalgia ([place]) 5 YEARS    Past Surgical History:  Procedure Laterality Date  . CARPAL TUNNEL RELEASE    . CATARACT EXTRACTION     right  . INGUINAL HERNIA REPAIR  10/14/2011   Procedure: HERNIA REPAIR INGUINAL ADULT;  Surgeon: [name], MD;  Location: [place];  Service: General;  Laterality: Right;  Right Inguinal Hernia Repair with Mesh  . TONSILLECTOMY    . VARICOSE VEIN SURGERY      There were no vitals filed for this visit.       Subjective Assessment - 06/11/16 0845    Subjective Returned from trip to [place] and [place].  I spent a lot of time in a wheelchair.  Reports there were few handrails so he  needed assistance to go up and down the steps, even the shallow ones.    Fell backwards while he was there while trying to go down shallow steps landing on his bottom.   Used a wheelchair in the airports.  Legs, ankles hurt and my back to some extent.  Left posterior hip pain from fall.       Currently in Pain? Yes   Pain Score 6    Pain Location Hip    Pain Orientation Left   Pain Type Chronic pain   Pain Onset More than a month ago   Pain Frequency Constant   Aggravating Factors  steps cause gait instability            [facility] PT Assessment - 06/11/16 0001      Assessment   Medical Diagnosis gait instability   Referring Provider Dr. Elease Hashimoto   Onset Date/Surgical Date --  6 months   Hand Dominance Right   Next MD Visit as needed   Prior Therapy fall/winter     Precautions   Precautions None     Restrictions   Weight Bearing Restrictions No     Balance Screen   Has the patient fallen in the past 6 months Yes   How many times? 1   Has the patient had a decrease in activity level because of a fear of falling?  No   Is the patient reluctant to leave their home because of a fear of falling?  No     Home Environment   Living Environment Private residence   Living Arrangements Alone   Type of [place] Access Level  entry   [place] - 2 wheels;[place] - single point     Prior Function   Vocation Retired   Leisure traveling going to [place] in June     Observation/Other Assessments   Other Surveys  Other Surveys   Activities of Balance Confidence Scale (ABC Scale)  69% balance confidence     Strength   Right Hip Flexion 4/5   Right Hip Extension 4-/5   Right Hip ABduction 3+/5   Left Hip Flexion 4/5   Left Hip Extension 4-/5   Left Hip ABduction 3-/5   Right Knee Flexion 4/5   Right Knee Extension 4/5   Left Knee Flexion 4-/5   Left Knee Extension 4-/5   Right Ankle Dorsiflexion 4/5   Right Ankle Eversion 4/5   Left Ankle Dorsiflexion 4-/5   Left Ankle Plantar Flexion 4-/5     Flexibility   Soft Tissue Assessment /Muscle Length --  decreased HS and hip flexor lengths bilaterally      Berg Balance Test   Sit to Stand Able to stand  independently using hands   Standing Unsupported Able to stand safely 2 minutes   Sitting with Back Unsupported but Feet  Supported on Floor or Stool Able to sit safely and securely 2 minutes   Stand to Sit Controls descent by using hands   Transfers Able to transfer safely, definite need of hands   Standing Unsupported with Eyes Closed Able to stand 10 seconds with supervision   Standing Ubsupported with Feet Together Needs help to attain position and unable to hold for 15 seconds   From Standing, Reach Forward with Outstretched Arm Can reach forward >12 cm safely (5")   From Standing Position, Pick up Object from Floor Able to pick up shoe, needs supervision   From Standing Position, Turn to Look Behind Over each Shoulder Looks behind one side only/other side shows less weight shift   Turn 360 Degrees Able to turn 360 degrees safely but slowly   Standing Unsupported, Alternately Place Feet on Step/Stool Able to complete 4 steps without aid or supervision   Standing Unsupported, One Foot in Front Able to take small step independently and hold 30 seconds   Standing on One Leg Tries to lift leg/unable to hold 3 seconds but remains standing independently   Total Score 36     Dynamic Gait Index   Level Surface Mild Impairment   Change in Gait Speed Mild Impairment   Gait with Horizontal Head Turns Moderate Impairment   Gait with Vertical Head Turns Moderate Impairment   Gait and Pivot Turn Moderate Impairment   Step Over Obstacle Mild Impairment   Step Around Obstacles Mild Impairment   Steps Moderate Impairment   Total Score 12     Timed Up and Go Test   Manual TUG (seconds) 15                             PT Short Term Goals - 06/11/16 1249      PT SHORT TERM GOAL #1   Title be independent in initial HEP  07/09/16   Time 4   Period Weeks   Status New     PT SHORT TERM GOAL #2   Title Activities-Specific Balance Confidence (ABC) Scale improved to 74% indicating improved confidence in his steadiness with functional activities at home and in the community   Time 4  Period Weeks    Status New     PT SHORT TERM GOAL #3   Title The patient will have improved hip flexor muscle length to 5 degrees and HS length to 55 degrees needed for improved stride length with ambulation   Time 4   Period Weeks   Status New     PT SHORT TERM GOAL #4   Title BERG balance test improved  to 38/56 indicating improved safety for standing ADLs like grooming and dresssing   Time 4   Period Weeks   Status New     PT SHORT TERM GOAL #5   Title Dynamic Gait Index improved to 14 indicating improved balance with dynamic functional activities like walking and climbing stairs   Time 4   Period Weeks   Status New           PT Long Term Goals - 06/11/16 1253      PT LONG TERM GOAL #1   Title be independent in advanced HEP for further improvements in strength, ROM and balance  08/06/16   Time 8   Period Weeks   Status New     PT LONG TERM GOAL #2   Title Activities Specific Balance Confidence Scale improved to 79% self confidence in his steadiness with functional activities   Time 8   Period Weeks   Status New     PT LONG TERM GOAL #3   Title Dynamic Gait Index improved to 16 indicating improved balance with functional activities like stairs    Time 8   Period Weeks   Status New     PT LONG TERM GOAL #4   Title Improved hip and knee strength to at least 4-/5 for greater ease with rising from a chair and  with standing > 10 min    Time 8   Period Weeks   Status New     PT LONG TERM GOAL #5   Title The patient will be able to ambulate 800 feet in six minutes needed for shorter distance community ambulation for traveling   Time 8   Period Weeks   Status New     PT LONG TERM GOAL #6   Title BERG balance score improved to 40/56 indicating improved safety with walking and standing dynamic activities   Time 8   Period Weeks   Status New     PT LONG TERM GOAL #7   Title ...     PT LONG TERM GOAL #8   Title .Marland KitchenMarland Kitchen               Plan - 06/11/16 0938    Clinical  Impression Statement The patient is an 83 year old with complaints of gait instability.   Recently returned from trip to [place] and [place].  States he  spent a lot of time in a wheelchair secondary to lack of balance and gait endurance.   Reports there were few handrails so he  needed assistance to go up and down the steps, even the shallow ones.    Fell backwards while he was there while trying to go down shallow steps landing on his bottom.   Used a wheelchair in the airports.  Used a single walking stick for short distance ambulation.   Legs, ankles hurt and my back to some extent.  Left posterior hip pain from fall.    The patient has decreased strength especially in hip abuctors and extensors.  Ambulates with wide based shuffled gait.  Per balance tests the patient is a high fall risk on all testing.  He would benefit from PT to address these deficits.  The patient is of  moderate complexity secondary to evolving status with a recent fall , advanced age, lack of home support and co-morbidities including L. DDD, poly neuropathy and HTN which could affect progress with rehab.     Rehab Potential Good   Clinical Impairments Affecting Rehab Potential high risk of falls   PT Frequency 2x / week   PT Duration 8 weeks   PT Treatment/Interventions ADLs/Self Care Home Management;Electrical Stimulation;Moist Heat;Therapeutic exercise;Therapeutic activities;Balance training;Neuromuscular re-education;Patient/family education;Taping;Manual techniques   PT Next Visit Plan Do 6 min walk test;  Nu-Step;  LE strengthening;  standing balance       Patient will benefit from skilled therapeutic intervention in order to improve the following deficits and impairments:  Decreased activity tolerance, Difficulty walking, Decreased strength, Pain, Decreased endurance, Abnormal gait, Decreased balance, Decreased coordination  Visit Diagnosis: Unspecified lack of coordination - Plan: PT plan of care cert/re-cert  Muscle  weakness (generalized) - Plan: PT plan of care cert/re-cert  Pain in left hip - Plan: PT plan of care cert/re-cert  Repeated falls - Plan: PT plan of care cert/re-cert      G-Codes - 42/59/56 1257    Functional Assessment Tool Used (Outpatient Only) Clinical judgement;  ABC scale   Functional Limitation Mobility: Walking and moving around   Mobility: Walking and Moving Around Current Status ([id number]) At least 40 percent but less than 60 percent impaired, limited or restricted   Mobility: Walking and Moving Around Goal Status [phone]) At least 20 percent but less than 40 percent impaired, limited or restricted       Problem List Patient Active Problem List   Diagnosis Date Noted  . Leg edema, right 02/20/2015  . BPH (benign prostatic hypertrophy) 12/09/2014  . Chronic radicular low back pain 08/06/2012  . COPD (chronic obstructive pulmonary disease) ([place]) 04/21/2012  . Hyponatremia 03/29/2012  . Localized osteoarthrosis not specified whether primary or secondary, lower leg 06/20/2010  . VARICOSE VEINS LOWER EXTREMITIES W/INFLAMMATION 02/12/2010  . [place] LOCALIZED OSTEOARTHROSIS INVOLVING HAND 10/16/2009  . DUPUYTREN'S CONTRACTURE, RIGHT 07/04/2009  . DEGENERATIVE DISC DISEASE, LUMBOSACRAL SPINE W/RADICULOPATHY 06/19/2009  . CARCINOMA, BASAL CELL 02/07/2009  . ANEMIA OF CHRONIC DISEASE 02/07/2009  . BURSITIS, RIGHT SHOULDER 10/07/2008  . CATARACT ASSOCIATED WITH OTHER SYNDROMES 08/01/2008  . DRY SKIN 08/01/2008  . CERUMEN IMPACTION, BILATERAL 03/01/2008  . CRAMP IN LIMB 01/18/2008  . DEPRESSION, SITUATIONAL, ACUTE 12/28/2007  . CONSTIPATION, DRUG INDUCED 12/28/2007  . POLYNEUROPATHY OTHER DISEASES CLASSIFIED ELSW 09/24/2007  . WEIGHT LOSS, ABNORMAL 01/07/2007  . Hyperlipidemia 09/22/2006  . Essential hypertension 09/22/2006  . GERD 09/22/2006  . OSTEOARTHRITIS 09/22/2006    Alvera Singh 06/11/2016, 1:01 PM  [facility] [address], [place], [place], [address] Phone: [phone]   Fax:  [phone]  Name: Darin [name] MRN: [id number] Date of Birth: [birth year]

## 2016-06-14 ENCOUNTER — Ambulatory Visit: Payer: Medicare Other | Admitting: Physical Therapy

## 2016-06-14 DIAGNOSIS — R296 Repeated falls: Secondary | ICD-10-CM | POA: Diagnosis not present

## 2016-06-14 DIAGNOSIS — M6281 Muscle weakness (generalized): Secondary | ICD-10-CM | POA: Diagnosis not present

## 2016-06-14 DIAGNOSIS — M25552 Pain in left hip: Secondary | ICD-10-CM | POA: Diagnosis not present

## 2016-06-14 DIAGNOSIS — R279 Unspecified lack of coordination: Secondary | ICD-10-CM

## 2016-06-14 NOTE — Therapy (Signed)
[facility] [address] [place], [place], [address] Phone: [phone]   Fax:  [phone]  Physical Therapy Treatment  Patient Details  Name: Darin [name] MRN: [id number] Date of Birth: Jul 02, 1931 Referring Provider: Dr. Elease Hashimoto  Encounter Date: 06/14/2016      PT End of Session - 06/14/16 0804    Visit Number 2   Number of Visits 10   Date for PT Re-Evaluation 15-Aug-2016   Authorization Type G codes visit 10;  KX now (previous PT this year)   PT Start Time 0758   PT Stop Time 0840   PT Time Calculation (min) 42 min   Activity Tolerance Patient tolerated treatment well      Past Medical History:  Diagnosis Date  . Arthritis   . GERD (gastroesophageal reflux disease)   . H/O hiatal hernia    "FOR YEARS" " NO PROBLEM"  . Hyperlipidemia   . Hypertension   . Knee pain   . Neuropathy (HCC)    PERIPHERAL   . Osteoporosis   . PMR (polymyalgia rheumatica) (HCC)   . Polymyalgia ([place]) 5 YEARS    Past Surgical History:  Procedure Laterality Date  . CARPAL TUNNEL RELEASE    . CATARACT EXTRACTION     right  . INGUINAL HERNIA REPAIR  10/14/2011   Procedure: HERNIA REPAIR INGUINAL ADULT;  Surgeon: [name], MD;  Location: [place];  Service: General;  Laterality: Right;  Right Inguinal Hernia Repair with Mesh  . TONSILLECTOMY    . VARICOSE VEIN SURGERY      There were no vitals filed for this visit.      Subjective Assessment - 06/14/16 0759    Subjective Reports his daughter is coming this weekend to visit.  My left leg/hip and back hurt (side he fell on while vacationing).     Currently in Pain? Yes   Pain Score 7    Pain Location Hip   Pain Orientation Left   Pain Type Acute pain   Pain Onset 1 to 4 weeks ago   Pain Frequency Constant   Aggravating Factors  just all the time;  no specific position or movement                         [facility] Adult PT Treatment/Exercise -  06/14/16 0001      Therapeutic Activites    Therapeutic Activities ADL's   ADL's sit to stand, walking, standing, up and down steps reciprocally 2 rails     Neuro Re-ed    Neuro Re-ed Details  anterior tibialis and gluteal activation     Knee/Hip Exercises: Aerobic   Nustep L3 10 min seat 11  Therapist present to discuss treatment     Knee/Hip Exercises: Standing   Knee Flexion Limitations stepping over obstacles and quick stepping   Hip Extension Stengthening;Right;Left;15 reps   Extension Limitations red band   Forward Step Up Right;Left;10 reps;Hand Hold: 2   SLS 5 ways with single arm support   Other Standing Knee Exercises staggered stand weight shifting   Other Standing Knee Exercises 4 way weight shifting     Knee/Hip Exercises: Seated   Long Arc Quad Strengthening;Right;Left;1 set;20 reps   Long Arc Quad Limitations red band   Clamshell with Thera[employer] Green   Knee/Hip Flexion 25x   Other Seated Knee/Hip Exercises hip flexion red band    Marching Right;Left;1 set;15 reps   Sit to [employer] 20  reps  From high/low table                  PT Short Term Goals - 06/14/16 0804      PT SHORT TERM GOAL #1   Title be independent in initial HEP  07/09/16   Time 4   Period Weeks   Status On-going     PT SHORT TERM GOAL #2   Title Conservation officer, nature (ABC) Scale improved to 74% indicating improved confidence in his steadiness with functional activities at home and in the community   Time 4   Period Weeks   Status On-going     PT SHORT TERM GOAL #3   Title The patient will have improved hip flexor muscle length to 5 degrees and HS length to 55 degrees needed for improved stride length with ambulation   Time 4   Period Weeks   Status On-going     PT SHORT TERM GOAL #4   Title BERG balance test improved  to 38/56 indicating improved safety for standing ADLs like grooming and dresssing   Time 4   Status On-going     PT SHORT TERM GOAL #5    Title Dynamic Gait Index improved to 14 indicating improved balance with dynamic functional activities like walking and climbing stairs   Time 4   Period Weeks   Status On-going           PT Long Term Goals - 06/14/16 0805      PT LONG TERM GOAL #1   Title be independent in advanced HEP for further improvements in strength, ROM and balance  08/06/16   Time 8   Period Weeks   Status On-going     PT LONG TERM GOAL #2   Title Activities Specific Balance Confidence Scale improved to 79% self confidence in his steadiness with functional activities   Time 8   Period Weeks   Status On-going     PT LONG TERM GOAL #3   Title Dynamic Gait Index improved to 16 indicating improved balance with functional activities like stairs    Time 8   Period Weeks   Status On-going     PT LONG TERM GOAL #4   Title Improved hip and knee strength to at least 4-/5 for greater ease with rising from a chair and  with standing > 10 min    Time 8   Period Weeks   Status On-going     PT LONG TERM GOAL #5   Title The patient will be able to ambulate 800 feet in six minutes needed for shorter distance community ambulation for traveling   Time 8   Period Weeks   Status On-going     PT LONG TERM GOAL #6   Title BERG balance score improved to 40/56 indicating improved safety with walking and standing dynamic activities   Time 8   Period Weeks   Status On-going               Plan - 06/14/16 7371    Clinical Impression Statement The patient has some complaint of dizziness with dynamic standing exercises which resolves with sitting for a short time.  Needs a 25% bilateral UE assist with step exercises.  Decreased balance with weight shifting backwards.   Therapist closely supervising for safety.   PT Next Visit Plan Do 6 min walk test;  Nu-Step;  LE strengthening;  standing balance;  KX every visit  Patient will benefit from skilled therapeutic intervention in order to improve the following  deficits and impairments:     Visit Diagnosis: Unspecified lack of coordination  Muscle weakness (generalized)  Pain in left hip  Repeated falls     Problem List Patient Active Problem List   Diagnosis Date Noted  . Leg edema, right 02/20/2015  . BPH (benign prostatic hypertrophy) 12/09/2014  . Chronic radicular low back pain 08/06/2012  . COPD (chronic obstructive pulmonary disease) ([place]) 04/21/2012  . Hyponatremia 03/29/2012  . Localized osteoarthrosis not specified whether primary or secondary, lower leg 06/20/2010  . VARICOSE VEINS LOWER EXTREMITIES W/INFLAMMATION 02/12/2010  . [place] LOCALIZED OSTEOARTHROSIS INVOLVING HAND 10/16/2009  . DUPUYTREN'S CONTRACTURE, RIGHT 07/04/2009  . DEGENERATIVE DISC DISEASE, LUMBOSACRAL SPINE W/RADICULOPATHY 06/19/2009  . CARCINOMA, BASAL CELL 02/07/2009  . ANEMIA OF CHRONIC DISEASE 02/07/2009  . BURSITIS, RIGHT SHOULDER 10/07/2008  . CATARACT ASSOCIATED WITH OTHER SYNDROMES 08/01/2008  . DRY SKIN 08/01/2008  . CERUMEN IMPACTION, BILATERAL 03/01/2008  . CRAMP IN LIMB 01/18/2008  . DEPRESSION, SITUATIONAL, ACUTE 12/28/2007  . CONSTIPATION, DRUG INDUCED 12/28/2007  . POLYNEUROPATHY OTHER DISEASES CLASSIFIED ELSW 09/24/2007  . WEIGHT LOSS, ABNORMAL 01/07/2007  . Hyperlipidemia 09/22/2006  . Essential hypertension 09/22/2006  . GERD 09/22/2006  . OSTEOARTHRITIS 09/22/2006   Ruben Im, PT 06/14/16 8:43 AM Phone: [phone] Fax: [phone]  Alvera Singh 06/14/2016, 8:42 AM  [facility] [address], [place], [place], [address] Phone: [phone]   Fax:  [phone]  Name: Ely Ballen MRN: [id number] Date of Birth: 09-05-1931

## 2016-06-18 ENCOUNTER — Ambulatory Visit: Payer: Medicare Other | Admitting: Physical Therapy

## 2016-06-18 DIAGNOSIS — M25552 Pain in left hip: Secondary | ICD-10-CM | POA: Diagnosis not present

## 2016-06-18 DIAGNOSIS — M6281 Muscle weakness (generalized): Secondary | ICD-10-CM

## 2016-06-18 DIAGNOSIS — R279 Unspecified lack of coordination: Secondary | ICD-10-CM

## 2016-06-18 DIAGNOSIS — R296 Repeated falls: Secondary | ICD-10-CM | POA: Diagnosis not present

## 2016-06-18 NOTE — Therapy (Signed)
[facility] [address] [place], [place], [address] Phone: [phone]   Fax:  [phone]  Physical Therapy Treatment  Patient Details  Name: Darin [name] MRN: [id number] Date of Birth: 1932-03-17 Referring Provider: Dr. Elease Hashimoto  Encounter Date: 06/18/2016      PT End of Session - 06/18/16 1324    Visit Number 3   Number of Visits 10   Date for PT Re-Evaluation 08/17/16   Authorization Type G codes visit 10;  KX now (previous PT this year)   PT Start Time 4010   PT Stop Time 0843   PT Time Calculation (min) 46 min   Activity Tolerance Patient tolerated treatment well      Past Medical History:  Diagnosis Date  . Arthritis   . GERD (gastroesophageal reflux disease)   . H/O hiatal hernia    "FOR YEARS" " NO PROBLEM"  . Hyperlipidemia   . Hypertension   . Knee pain   . Neuropathy (HCC)    PERIPHERAL   . Osteoporosis   . PMR (polymyalgia rheumatica) (HCC)   . Polymyalgia ([place]) 5 YEARS    Past Surgical History:  Procedure Laterality Date  . CARPAL TUNNEL RELEASE    . CATARACT EXTRACTION     right  . INGUINAL HERNIA REPAIR  10/14/2011   Procedure: HERNIA REPAIR INGUINAL ADULT;  Surgeon: [name], MD;  Location: [place];  Service: General;  Laterality: Right;  Right Inguinal Hernia Repair with Mesh  . TONSILLECTOMY    . VARICOSE VEIN SURGERY      There were no vitals filed for this visit.      Subjective Assessment - 06/18/16 0804    Subjective My legs are sore today.  I did quite a bit of standing and walking yesterday and it's the aftermath of that.     Currently in Pain? Yes   Pain Score 7    Pain Location Leg   Pain Orientation Right;Left   Pain Type Chronic pain   Pain Onset More than a month ago   Pain Frequency Constant   Aggravating Factors  prolonged standing, walking   Pain Relieving Factors sitting            [facility] PT Assessment - 06/18/16 0001      6 Minute  Walk- Baseline   Perceived Rate of Exertion ([place]) 12-     6 minute walk test results    Aerobic Endurance Distance Walked 720                     [facility] Adult PT Treatment/Exercise - 06/18/16 0001      Therapeutic Activites    ADL's sit to stand, walking, standing, up and down steps reciprocally 2 rails     Neuro Re-ed    Neuro Re-ed Details  anterior tibialis and gluteal activation     Knee/Hip Exercises: Stretches   Active Hamstring Stretch Right;Left;5 reps   Active Hamstring Stretch Limitations on step    Other Knee/Hip Stretches stair hip flexor stretch 5x right/left   Other Knee/Hip Stretches calf stretch on step 3x right/left     Knee/Hip Exercises: Aerobic   Nustep L3 10 min seat 11  Therapist present to discuss treatment     Knee/Hip Exercises: Standing   Other Standing Knee Exercises step standing 4 way weight shift   Other Standing Knee Exercises 4 way weight shifting     Knee/Hip Exercises: Seated   Diona Foley  Squeeze with sit to stand 10x high table   Clamshell with TheraBand Green  20x   Sit to [employer] 20 reps  From high/low table                  PT Short Term Goals - 06/18/16 0934      PT SHORT TERM GOAL #1   Title be independent in initial HEP  07/09/16   Time 4   Period Weeks   Status On-going     PT SHORT TERM GOAL #2   Title Conservation officer, nature (ABC) Scale improved to 74% indicating improved confidence in his steadiness with functional activities at home and in the community   Time 4   Period Weeks   Status On-going     PT SHORT TERM GOAL #3   Title The patient will have improved hip flexor muscle length to 5 degrees and HS length to 55 degrees needed for improved stride length with ambulation   Time 4   Period Weeks   Status On-going     PT SHORT TERM GOAL #4   Title BERG balance test improved  to 38/56 indicating improved safety for standing ADLs like grooming and dresssing   Time 4   Period Weeks    Status On-going     PT SHORT TERM GOAL #5   Title Dynamic Gait Index improved to 14 indicating improved balance with dynamic functional activities like walking and climbing stairs   Time 4   Period Weeks   Status On-going           PT Long Term Goals - 06/18/16 0935      PT LONG TERM GOAL #1   Title be independent in advanced HEP for further improvements in strength, ROM and balance  08/06/16   Time 8   Period Weeks   Status On-going     PT LONG TERM GOAL #2   Title Activities Specific Balance Confidence Scale improved to 79% self confidence in his steadiness with functional activities   Time 8   Period Weeks   Status On-going     PT LONG TERM GOAL #3   Title Dynamic Gait Index improved to 16 indicating improved balance with functional activities like stairs    Time 8   Period Weeks     PT LONG TERM GOAL #4   Title Improved hip and knee strength to at least 4-/5 for greater ease with rising from a chair and  with standing > 10 min    Time 8   Period Weeks   Status On-going     PT LONG TERM GOAL #5   Title The patient will be able to ambulate 800 feet in six minutes needed for shorter distance community ambulation for traveling   Time 8   Period Weeks   Status On-going     PT LONG TERM GOAL #6   Title BERG balance score improved to 40/56 indicating improved safety with walking and standing dynamic activities   Time 8   Period Weeks   Status On-going               Plan - 06/18/16 0349    Clinical Impression Statement The patient has difficulty with narrow base of support with minimal perturbances to balance.  Some dizziness requiring several sitting and standing rest breaks.  Difficulty with narrow base of support with sit to stand.  Decreased proprioception with weight shifting.  Close supervision for safety.  PT Next Visit Plan  Nu-Step;  LE strengthening;  standing balance;  therapeutic activities;  KX every visit       Patient will benefit from  skilled therapeutic intervention in order to improve the following deficits and impairments:     Visit Diagnosis: Unspecified lack of coordination  Muscle weakness (generalized)  Pain in left hip  Repeated falls     Problem List Patient Active Problem List   Diagnosis Date Noted  . Leg edema, right 02/20/2015  . BPH (benign prostatic hypertrophy) 12/09/2014  . Chronic radicular low back pain 08/06/2012  . COPD (chronic obstructive pulmonary disease) ([place]) 04/21/2012  . Hyponatremia 03/29/2012  . Localized osteoarthrosis not specified whether primary or secondary, lower leg 06/20/2010  . VARICOSE VEINS LOWER EXTREMITIES W/INFLAMMATION 02/12/2010  . [place] LOCALIZED OSTEOARTHROSIS INVOLVING HAND 10/16/2009  . DUPUYTREN'S CONTRACTURE, RIGHT 07/04/2009  . DEGENERATIVE DISC DISEASE, LUMBOSACRAL SPINE W/RADICULOPATHY 06/19/2009  . CARCINOMA, BASAL CELL 02/07/2009  . ANEMIA OF CHRONIC DISEASE 02/07/2009  . BURSITIS, RIGHT SHOULDER 10/07/2008  . CATARACT ASSOCIATED WITH OTHER SYNDROMES 08/01/2008  . DRY SKIN 08/01/2008  . CERUMEN IMPACTION, BILATERAL 03/01/2008  . CRAMP IN LIMB 01/18/2008  . DEPRESSION, SITUATIONAL, ACUTE 12/28/2007  . CONSTIPATION, DRUG INDUCED 12/28/2007  . POLYNEUROPATHY OTHER DISEASES CLASSIFIED ELSW 09/24/2007  . WEIGHT LOSS, ABNORMAL 01/07/2007  . Hyperlipidemia 09/22/2006  . Essential hypertension 09/22/2006  . GERD 09/22/2006  . OSTEOARTHRITIS 09/22/2006   Ruben Im, PT 06/18/16 9:37 AM Phone: [phone] Fax: [phone]  Alvera Singh 06/18/2016, 9:36 AM  [facility] [address], [place], [place], [address] Phone: [phone]   Fax:  [phone]  Name: Darin [name] MRN: [id number] Date of Birth: [birth year]

## 2016-06-19 ENCOUNTER — Ambulatory Visit (INDEPENDENT_AMBULATORY_CARE_PROVIDER_SITE_OTHER): Payer: Medicare Other | Admitting: Podiatry

## 2016-06-19 ENCOUNTER — Encounter: Payer: Self-pay | Admitting: Podiatry

## 2016-06-19 DIAGNOSIS — M79676 Pain in unspecified toe(s): Secondary | ICD-10-CM

## 2016-06-19 DIAGNOSIS — B351 Tinea unguium: Secondary | ICD-10-CM

## 2016-06-19 NOTE — Progress Notes (Signed)
Complaint:  Visit Type: Patient returns to my office for continued preventative foot care services. Complaint: Patient states" my nails have grown long and thick and become painful to walk and wear shoes" Patient has been diagnosed with neuropathy.. The patient presents for preventative foot care services. No changes to ROS  Podiatric Exam: Vascular: dorsalis pedis and posterior tibial pulses are palpable bilateral. Capillary return is immediate. Temperature gradient is WNL. Skin turgor WNL  Sensorium: Normal Semmes Weinstein monofilament test. Normal tactile sensation bilaterally. Nail Exam: Pt has thick disfigured discolored nails with subungual debris noted bilateral entire nail hallux through fifth toenails Ulcer Exam: There is no evidence of ulcer or pre-ulcerative changes or infection. Orthopedic Exam: Muscle tone and strength are WNL. No limitations in general ROM. No crepitus or effusions noted. Foot type and digits show no abnormalities. Bony prominences are unremarkable. Skin: No Porokeratosis. No infection or ulcers  Diagnosis:  Onychomycosis, , Pain in right toe, pain in left toes  Treatment & Plan Procedures and Treatment: Consent by patient was obtained for treatment procedures. The patient understood the discussion of treatment and procedures well. All questions were answered thoroughly reviewed. Debridement of mycotic and hypertrophic toenails, 1 through 5 bilateral and clearing of subungual debris. No ulceration, no infection noted.  Return Visit-Office Procedure: Patient instructed to return to the office for a follow up visit 3 months for continued evaluation and treatment.    Tamerra Merkley DPM 

## 2016-06-25 ENCOUNTER — Encounter: Payer: Self-pay | Admitting: Physical Therapy

## 2016-06-25 ENCOUNTER — Ambulatory Visit: Payer: Medicare Other | Attending: Family Medicine | Admitting: Physical Therapy

## 2016-06-25 DIAGNOSIS — M6281 Muscle weakness (generalized): Secondary | ICD-10-CM | POA: Insufficient documentation

## 2016-06-25 DIAGNOSIS — R296 Repeated falls: Secondary | ICD-10-CM | POA: Insufficient documentation

## 2016-06-25 DIAGNOSIS — R279 Unspecified lack of coordination: Secondary | ICD-10-CM | POA: Insufficient documentation

## 2016-06-25 DIAGNOSIS — M25552 Pain in left hip: Secondary | ICD-10-CM | POA: Diagnosis not present

## 2016-06-25 NOTE — Therapy (Signed)
[facility] [address] [place], [place], [address] Phone: [phone]   Fax:  [phone]  Physical Therapy Treatment  Patient Details  Name: Darin [name] MRN: [id number] Date of Birth: 1931-11-29 Referring Provider: Dr. Elease Hashimoto  Encounter Date: 06/25/2016      PT End of Session - 06/25/16 0844    Visit Number 4   Number of Visits 10   Date for PT Re-Evaluation September 03, 2016   Authorization Type G codes visit 10;  KX now (previous PT this year)   PT Start Time 0840   PT Stop Time 0929   PT Time Calculation (min) 49 min   Activity Tolerance Patient tolerated treatment well   Behavior During Therapy [facility] for tasks assessed/performed      Past Medical History:  Diagnosis Date  . Arthritis   . GERD (gastroesophageal reflux disease)   . H/O hiatal hernia    "FOR YEARS" " NO PROBLEM"  . Hyperlipidemia   . Hypertension   . Knee pain   . Neuropathy (HCC)    PERIPHERAL   . Osteoporosis   . PMR (polymyalgia rheumatica) (HCC)   . Polymyalgia ([place]) 5 YEARS    Past Surgical History:  Procedure Laterality Date  . CARPAL TUNNEL RELEASE    . CATARACT EXTRACTION     right  . INGUINAL HERNIA REPAIR  10/14/2011   Procedure: HERNIA REPAIR INGUINAL ADULT;  Surgeon: [name], MD;  Location: [place];  Service: General;  Laterality: Right;  Right Inguinal Hernia Repair with Mesh  . TONSILLECTOMY    . VARICOSE VEIN SURGERY      There were no vitals filed for this visit.      Subjective Assessment - 06/25/16 0845    Subjective I always have pain, that doesn't change. Pain in lower legs.   Pertinent History chronic lumbar DDD, polyneuropathy;  HTN   Limitations Walking;House hold activities;Standing   How long can you sit comfortably? as long as  I want but trouble rising following    Diagnostic tests none recently   Patient Stated Goals walk normally;  maintain my balance   Currently in Pain? Yes   Pain Score  6    Pain Location Leg   Pain Orientation Right;Left   Pain Descriptors / Indicators Sore   Pain Type Chronic pain   Pain Onset More than a month ago   Pain Frequency Constant   Aggravating Factors  prolonged standing, walking   Pain Relieving Factors sitting                         [facility] Adult PT Treatment/Exercise - 06/25/16 0001      Therapeutic Activites    Therapeutic Activities ADL's   ADL's sit to stand, walking, standing, up and down steps reciprocally 2 rails     Neuro Re-ed    Neuro Re-ed Details  anterior tibialis and gluteal activation     Knee/Hip Exercises: Stretches   Active Hamstring Stretch Right;Left;5 reps   Active Hamstring Stretch Limitations on step    Other Knee/Hip Stretches stair hip flexor stretch 5x right/left   Other Knee/Hip Stretches calf stretch on step 3x right/left     Knee/Hip Exercises: Aerobic   Nustep L3 10 min seat 11  Therapist present to discuss treatment     Knee/Hip Exercises: Standing   Other Standing Knee Exercises step standing 4 way weight shift   Other Standing Knee Exercises 4  way weight shifting     Knee/Hip Exercises: Seated   Ball Squeeze with sit to stand 10x high table   Clamshell with TheraBand Green  20x   Sit to [employer] 20 reps  From high/low table                  PT Short Term Goals - 06/18/16 0934      PT SHORT TERM GOAL #1   Title be independent in initial HEP  07/09/16   Time 4   Period Weeks   Status On-going     PT SHORT TERM GOAL #2   Title Conservation officer, 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[employer]) Scale improved to 74% indicating improved confidence in his steadiness with functional activities at home and in the community   Time 4   Period Weeks   Status On-going     PT SHORT TERM GOAL #3   Title The patient will have improved hip flexor muscle length to 5 degrees and HS length to 55 degrees needed for improved stride length with ambulation   Time 4   Period Weeks   Status On-going      PT SHORT TERM GOAL #4   Title BERG balance test improved  to 38/56 indicating improved safety for standing ADLs like grooming and dresssing   Time 4   Period Weeks   Status On-going     PT SHORT TERM GOAL #5   Title Dynamic Gait Index improved to 14 indicating improved balance with dynamic functional activities like walking and climbing stairs   Time 4   Period Weeks   Status On-going           PT Long Term Goals - 06/18/16 0935      PT LONG TERM GOAL #1   Title be independent in advanced HEP for further improvements in strength, ROM and balance  08/06/16   Time 8   Period Weeks   Status On-going     PT LONG TERM GOAL #2   Title Activities Specific Balance Confidence Scale improved to 79% self confidence in his steadiness with functional activities   Time 8   Period Weeks   Status On-going     PT LONG TERM GOAL #3   Title Dynamic Gait Index improved to 16 indicating improved balance with functional activities like stairs    Time 8   Period Weeks     PT LONG TERM GOAL #4   Title Improved hip and knee strength to at least 4-/5 for greater ease with rising from a chair and  with standing > 10 min    Time 8   Period Weeks   Status On-going     PT LONG TERM GOAL #5   Title The patient will be able to ambulate 800 feet in six minutes needed for shorter distance community ambulation for traveling   Time 8   Period Weeks   Status On-going     PT LONG TERM GOAL #6   Title BERG balance score improved to 40/56 indicating improved safety with walking and standing dynamic activities   Time 8   Period Weeks   Status On-going               Plan - 06/25/16 0940    Clinical Impression Statement Pt continues to have difficulty with sit to stand. Did well with all standing weight shifting exercises. Having decreased endurance. Pt will continue to benefit from skilled therapy for balance and strenghtening.    Rehab  Potential Good   Clinical Impairments Affecting  Rehab Potential high risk of falls   PT Frequency 2x / week   PT Duration 8 weeks   PT Treatment/Interventions ADLs/Self Care Home Management;Electrical Stimulation;Moist Heat;Therapeutic exercise;Therapeutic activities;Balance training;Neuromuscular re-education;Patient/family education;Taping;Manual techniques   PT Next Visit Plan  Nu-Step;  LE strengthening;  standing balance;  therapeutic activities;  KX every visit    Consulted and Agree with Plan of Care Patient      Patient will benefit from skilled therapeutic intervention in order to improve the following deficits and impairments:  Decreased activity tolerance, Difficulty walking, Decreased strength, Pain, Decreased endurance, Abnormal gait, Decreased balance, Decreased coordination  Visit Diagnosis: Unspecified lack of coordination  Muscle weakness (generalized)  Pain in left hip  Repeated falls     Problem List Patient Active Problem List   Diagnosis Date Noted  . Leg edema, right 02/20/2015  . BPH (benign prostatic hypertrophy) 12/09/2014  . Chronic radicular low back pain 08/06/2012  . COPD (chronic obstructive pulmonary disease) ([place]) 04/21/2012  . Hyponatremia 03/29/2012  . Localized osteoarthrosis not specified whether primary or secondary, lower leg 06/20/2010  . VARICOSE VEINS LOWER EXTREMITIES W/INFLAMMATION 02/12/2010  . [place] LOCALIZED OSTEOARTHROSIS INVOLVING HAND 10/16/2009  . DUPUYTREN'S CONTRACTURE, RIGHT 07/04/2009  . DEGENERATIVE DISC DISEASE, LUMBOSACRAL SPINE W/RADICULOPATHY 06/19/2009  . CARCINOMA, BASAL CELL 02/07/2009  . ANEMIA OF CHRONIC DISEASE 02/07/2009  . BURSITIS, RIGHT SHOULDER 10/07/2008  . CATARACT ASSOCIATED WITH OTHER SYNDROMES 08/01/2008  . DRY SKIN 08/01/2008  . CERUMEN IMPACTION, BILATERAL 03/01/2008  . CRAMP IN LIMB 01/18/2008  . DEPRESSION, SITUATIONAL, ACUTE 12/28/2007  . CONSTIPATION, DRUG INDUCED 12/28/2007  . POLYNEUROPATHY OTHER DISEASES CLASSIFIED ELSW 09/24/2007  .  WEIGHT LOSS, ABNORMAL 01/07/2007  . Hyperlipidemia 09/22/2006  . Essential hypertension 09/22/2006  . GERD 09/22/2006  . OSTEOARTHRITIS 09/22/2006    Mikle Bosworth PTA 06/25/2016, 9:44 AM  [facility] [address] [place], [place], [address] Phone: [phone]   Fax:  [phone]  Name: Darin [name] MRN: [id number] Date of Birth: Jun 09, 1931

## 2016-06-28 ENCOUNTER — Ambulatory Visit: Payer: Medicare Other | Admitting: Physical Therapy

## 2016-06-28 DIAGNOSIS — M6281 Muscle weakness (generalized): Secondary | ICD-10-CM

## 2016-06-28 DIAGNOSIS — M25552 Pain in left hip: Secondary | ICD-10-CM

## 2016-06-28 DIAGNOSIS — R279 Unspecified lack of coordination: Secondary | ICD-10-CM | POA: Diagnosis not present

## 2016-06-28 DIAGNOSIS — R296 Repeated falls: Secondary | ICD-10-CM | POA: Diagnosis not present

## 2016-06-28 NOTE — Therapy (Addendum)
[facility] [address], [place], [place], [address] Phone: [phone]   Fax:  [phone]  Physical Therapy Treatment  Patient Details  Name: Darin [name] MRN: [id number] Date of Birth: [birth year] Referring Provider: Dr. Elease Hashimoto  Encounter Date: 06/28/2016      PT End of Session - 06/28/16 1224    Visit Number 5   Number of Visits 10   Date for PT Re-Evaluation 2016-08-12   Authorization Type G codes visit 10;  KX now (previous PT this year)   PT Start Time 0758   PT Stop Time 0848   PT Time Calculation (min) 50 min   Activity Tolerance Patient tolerated treatment well      Past Medical History:  Diagnosis Date  . Arthritis   . GERD (gastroesophageal reflux disease)   . H/O hiatal hernia    "FOR YEARS" " NO PROBLEM"  . Hyperlipidemia   . Hypertension   . Knee pain   . Neuropathy (HCC)    PERIPHERAL   . Osteoporosis   . PMR (polymyalgia rheumatica) (HCC)   . Polymyalgia ([place]) 5 YEARS    Past Surgical History:  Procedure Laterality Date  . CARPAL TUNNEL RELEASE    . CATARACT EXTRACTION     right  . INGUINAL HERNIA REPAIR  10/14/2011   Procedure: HERNIA REPAIR INGUINAL ADULT;  Surgeon: [name], MD;  Location: [place];  Service: General;  Laterality: Right;  Right Inguinal Hernia Repair with Mesh  . TONSILLECTOMY    . VARICOSE VEIN SURGERY      There were no vitals filed for this visit.      Subjective Assessment - 06/28/16 0758    Subjective My left lower (lateral) leg has been giving me a hard time.  Doesn't matter whether I sit or walk or stand, day or night it hurts the same.    Currently in Pain? Yes   Pain Score 8    Pain Location Leg   Pain Orientation Left                         [facility] Adult PT Treatment/Exercise - 06/28/16 0001      Therapeutic Activites    ADL's sit to stand, walking, standing, up and down steps reciprocally 2 rails     Neuro Re-ed    Neuro Re-ed Details  anterior tibialis and gluteal activation     Knee/Hip Exercises: Stretches   Active Hamstring Stretch Right;Left;5 reps   Active Hamstring Stretch Limitations on step    Other Knee/Hip Stretches stair hip flexor stretch 5x right/left   Other Knee/Hip Stretches calf stretch on step 3x right/left     Knee/Hip Exercises: Aerobic   Stationary Bike 5 min   Nustep L3 10 min seat 11  Therapist present to discuss treatment     Knee/Hip Exercises: Standing   Hip ADduction AROM;Right;Left;15 reps   Hip Extension AROM;Right;Left;15 reps   Other Standing Knee Exercises step taps 20x     Knee/Hip Exercises: Seated   Ball Squeeze with sit to stand 10x high table  from [employer] on mat   Clamshell with [employer]  20x   Marching Both;20 reps  from [employer] to [employer] 20 reps  From [employer]                  PT Short Term Goals - 06/28/16 1228      PT SHORT TERM  GOAL #1   Title be independent in initial HEP  07/09/16   Time 4   Period Weeks   Status On-going     PT SHORT TERM GOAL #2   Title Conservation officer, nature (ABC) Scale improved to 74% indicating improved confidence in his steadiness with functional activities at home and in the community   Time 4   Period Weeks   Status On-going     PT SHORT TERM GOAL #3   Title The patient will have improved hip flexor muscle length to 5 degrees and HS length to 55 degrees needed for improved stride length with ambulation   Time 4   Period Weeks   Status On-going     PT SHORT TERM GOAL #4   Title BERG balance test improved  to 38/56 indicating improved safety for standing ADLs like grooming and dresssing   Time 4   Period Weeks   Status On-going     PT SHORT TERM GOAL #5   Title Dynamic Gait Index improved to 14 indicating improved balance with dynamic functional activities like walking and climbing stairs   Time 4   Period Weeks   Status On-going           PT Long Term Goals -  06/28/16 1229      PT LONG TERM GOAL #1   Title be independent in advanced HEP for further improvements in strength, ROM and balance  08/06/16   Time 8   Period Weeks   Status On-going     PT LONG TERM GOAL #2   Title Activities Specific Balance Confidence Scale improved to 79% self confidence in his steadiness with functional activities   Time 8   Period Weeks   Status On-going     PT LONG TERM GOAL #3   Title Dynamic Gait Index improved to 16 indicating improved balance with functional activities like stairs    Period Weeks   Status On-going     PT LONG TERM GOAL #4   Title Improved hip and knee strength to at least 4-/5 for greater ease with rising from a chair and  with standing > 10 min    Time 8   Period Weeks   Status On-going     PT LONG TERM GOAL #5   Title The patient will be able to ambulate 800 feet in six minutes needed for shorter distance community ambulation for traveling   Time 8   Period Weeks   Status On-going     PT LONG TERM GOAL #6   Title BERG balance score improved to 40/56 indicating improved safety with walking and standing dynamic activities   Time 8   Period Weeks   Status On-going               Plan - 06/28/16 1225    Clinical Impression Statement Reported increase in lower leg pain this week although patient reports it is neither worsened or improved with exercise.  Intermittent dizziness in standing which dissipates with no movement.  Continues to have decreased balance without UE support or narrow base of support.  On track to meet STGS in next 1-2 weeks.     PT Next Visit Plan  Nu-Step;  LE strengthening;  standing balance backwards weight shifting;  narrow base of support, decreased UE support;   therapeutic activities;  KX every visit       Patient will benefit from skilled therapeutic intervention in order to improve the following deficits  and impairments:     Visit Diagnosis: Unspecified lack of coordination  Muscle weakness  (generalized)  Pain in left hip  Repeated falls     Problem List Patient Active Problem List   Diagnosis Date Noted  . Leg edema, right 02/20/2015  . BPH (benign prostatic hypertrophy) 12/09/2014  . Chronic radicular low back pain 08/06/2012  . COPD (chronic obstructive pulmonary disease) ([place]) 04/21/2012  . Hyponatremia 03/29/2012  . Localized osteoarthrosis not specified whether primary or secondary, lower leg 06/20/2010  . VARICOSE VEINS LOWER EXTREMITIES W/INFLAMMATION 02/12/2010  . [place] LOCALIZED OSTEOARTHROSIS INVOLVING HAND 10/16/2009  . DUPUYTREN'S CONTRACTURE, RIGHT 07/04/2009  . DEGENERATIVE DISC DISEASE, LUMBOSACRAL SPINE W/RADICULOPATHY 06/19/2009  . CARCINOMA, BASAL CELL 02/07/2009  . ANEMIA OF CHRONIC DISEASE 02/07/2009  . BURSITIS, RIGHT SHOULDER 10/07/2008  . CATARACT ASSOCIATED WITH OTHER SYNDROMES 08/01/2008  . DRY SKIN 08/01/2008  . CERUMEN IMPACTION, BILATERAL 03/01/2008  . CRAMP IN LIMB 01/18/2008  . DEPRESSION, SITUATIONAL, ACUTE 12/28/2007  . CONSTIPATION, DRUG INDUCED 12/28/2007  . POLYNEUROPATHY OTHER DISEASES CLASSIFIED ELSW 09/24/2007  . WEIGHT LOSS, ABNORMAL 01/07/2007  . Hyperlipidemia 09/22/2006  . Essential hypertension 09/22/2006  . GERD 09/22/2006  . OSTEOARTHRITIS 09/22/2006   Ruben Im, PT 06/28/16 12:33 PM Phone: [phone] Fax: [phone]  Alvera Singh 06/28/2016, 12:31 PM  [facility] [address], [place], [place], [address] Phone: [phone]   Fax:  [phone]  Name: Darin [name] MRN: [id number] Date of Birth: 1931-12-30

## 2016-07-02 ENCOUNTER — Ambulatory Visit: Payer: Medicare Other | Admitting: Physical Therapy

## 2016-07-02 DIAGNOSIS — R279 Unspecified lack of coordination: Secondary | ICD-10-CM

## 2016-07-02 DIAGNOSIS — M6281 Muscle weakness (generalized): Secondary | ICD-10-CM | POA: Diagnosis not present

## 2016-07-02 DIAGNOSIS — M25552 Pain in left hip: Secondary | ICD-10-CM

## 2016-07-02 DIAGNOSIS — R296 Repeated falls: Secondary | ICD-10-CM | POA: Diagnosis not present

## 2016-07-02 NOTE — Therapy (Signed)
[facility] [address], [place], [place], [address] Phone: [phone]   Fax:  [phone]  Physical Therapy Treatment  Patient Details  Name: Darin [name] MRN: [id number] Date of Birth: [birth year] Referring Provider: Dr. Elease Hashimoto  Encounter Date: 07/02/2016      PT End of Session - 07/02/16 0826    Visit Number 6   Number of Visits 10   Date for PT Re-Evaluation 08-18-16   Authorization Type G codes visit 10;  KX now (previous PT this year)   PT Start Time 0800   PT Stop Time 0845   PT Time Calculation (min) 45 min   Activity Tolerance Patient tolerated treatment well      Past Medical History:  Diagnosis Date  . Arthritis   . GERD (gastroesophageal reflux disease)   . H/O hiatal hernia    "FOR YEARS" " NO PROBLEM"  . Hyperlipidemia   . Hypertension   . Knee pain   . Neuropathy (HCC)    PERIPHERAL   . Osteoporosis   . PMR (polymyalgia rheumatica) (HCC)   . Polymyalgia ([place]) 5 YEARS    Past Surgical History:  Procedure Laterality Date  . CARPAL TUNNEL RELEASE    . CATARACT EXTRACTION     right  . INGUINAL HERNIA REPAIR  10/14/2011   Procedure: HERNIA REPAIR INGUINAL ADULT;  Surgeon: [name], MD;  Location: [facility];  Service: General;  Laterality: Right;  Right Inguinal Hernia Repair with Mesh  . TONSILLECTOMY    . VARICOSE VEIN SURGERY      There were no vitals filed for this visit.      Subjective Assessment - 07/02/16 0809    Subjective My legs are a little bit better.  6/10.      Currently in Pain? Yes   Pain Score 6    Pain Location Leg   Pain Type Chronic pain   Pain Frequency Constant   Aggravating Factors  prolonged standing, walking   Pain Relieving Factors sitting                         [facility] Adult PT Treatment/Exercise - 07/02/16 0001      Therapeutic Activites    ADL's sit to stand, walking, standing, up and down steps     Neuro Re-ed    Neuro Re-ed Details  anterior tibialis and gluteal activation     Lumbar Exercises: Machines for Strengthening   Leg Press Seat 9 90#, single leg 50# 50x each     Knee/Hip Exercises: Stretches   Active Hamstring Stretch Right;Left;5 reps   Active Hamstring Stretch Limitations on step    Other Knee/Hip Stretches stair hip flexor stretch 5x right/left   Other Knee/Hip Stretches calf stretch on step 3x right/left     Knee/Hip Exercises: Aerobic   Stationary Bike 5 min   Nustep L3 10 min seat 11  Therapist present to discuss treatment     Knee/Hip Exercises: Standing   Hip ADduction Strengthening;Right;Left;2 sets;10 reps  multi angle   Other Standing Knee Exercises heel taps 10x right/left    Other Standing Knee Exercises mini lunges 10x right and left                  PT Short Term Goals - 07/02/16 0833      PT SHORT TERM GOAL #1   Title be independent in initial HEP  07/09/16   Time 4  Period Weeks   Status On-going     PT SHORT TERM GOAL #2   Title Conservation officer, nature (ABC) Scale improved to 74% indicating improved confidence in his steadiness with functional activities at home and in the community   Time 4   Period Weeks   Status On-going     PT SHORT TERM GOAL #3   Title The patient will have improved hip flexor muscle length to 5 degrees and HS length to 55 degrees needed for improved stride length with ambulation   Time 4   Period Weeks   Status On-going     PT SHORT TERM GOAL #4   Title BERG balance test improved  to 38/56 indicating improved safety for standing ADLs like grooming and dresssing   Time 4   Period Weeks   Status On-going     PT SHORT TERM GOAL #5   Title Dynamic Gait Index improved to 14 indicating improved balance with dynamic functional activities like walking and climbing stairs   Time 4   Period Weeks   Status On-going           PT Long Term Goals - 07/02/16 2595      PT LONG TERM GOAL #1   Title be  independent in advanced HEP for further improvements in strength, ROM and balance  08/06/16   Time 8   Period Weeks   Status On-going     PT LONG TERM GOAL #2   Title Activities Specific Balance Confidence Scale improved to 79% self confidence in his steadiness with functional activities   Time 8   Period Weeks   Status On-going     PT LONG TERM GOAL #3   Title Dynamic Gait Index improved to 16 indicating improved balance with functional activities like stairs    Time 8   Period Weeks   Status On-going     PT LONG TERM GOAL #4   Title Improved hip and knee strength to at least 4-/5 for greater ease with rising from a chair and  with standing > 10 min    Time 8   Period Weeks   Status On-going     PT LONG TERM GOAL #5   Title The patient will be able to ambulate 800 feet in six minutes needed for shorter distance community ambulation for traveling   Time 8   Period Weeks   Status On-going     PT LONG TERM GOAL #6   Title BERG balance score improved to 40/56 indicating improved safety with walking and standing dynamic activities   Time 8   Period Weeks   Status On-going               Plan - 07/02/16 0827    Clinical Impression Statement Reports some dizziness with standing which dissipates with sitting still.  He reports exercise does not worsen his LE pain.  Able to perform single UE support with LE dynamic exercise without loss of balance. Should meet STGs in next week.      PT Next Visit Plan  Nu-Step;  LE strengthening;  standing balance backwards weight shifting;  narrow base of support, decreased UE support;   therapeutic activities;  KX every visit ;  next week ABC scale, BERG, DGI       Patient will benefit from skilled therapeutic intervention in order to improve the following deficits and impairments:     Visit Diagnosis: Unspecified lack of coordination  Muscle weakness (generalized)  Pain in left hip  Repeated falls     Problem List Patient  Active Problem List   Diagnosis Date Noted  . Leg edema, right 02/20/2015  . BPH (benign prostatic hypertrophy) 12/09/2014  . Chronic radicular low back pain 08/06/2012  . COPD (chronic obstructive pulmonary disease) ([place]) 04/21/2012  . Hyponatremia 03/29/2012  . Localized osteoarthrosis not specified whether primary or secondary, lower leg 06/20/2010  . VARICOSE VEINS LOWER EXTREMITIES W/INFLAMMATION 02/12/2010  . [place] LOCALIZED OSTEOARTHROSIS INVOLVING HAND 10/16/2009  . DUPUYTREN'S CONTRACTURE, RIGHT 07/04/2009  . DEGENERATIVE DISC DISEASE, LUMBOSACRAL SPINE W/RADICULOPATHY 06/19/2009  . CARCINOMA, BASAL CELL 02/07/2009  . ANEMIA OF CHRONIC DISEASE 02/07/2009  . BURSITIS, RIGHT SHOULDER 10/07/2008  . CATARACT ASSOCIATED WITH OTHER SYNDROMES 08/01/2008  . DRY SKIN 08/01/2008  . CERUMEN IMPACTION, BILATERAL 03/01/2008  . CRAMP IN LIMB 01/18/2008  . DEPRESSION, SITUATIONAL, ACUTE 12/28/2007  . CONSTIPATION, DRUG INDUCED 12/28/2007  . POLYNEUROPATHY OTHER DISEASES CLASSIFIED ELSW 09/24/2007  . WEIGHT LOSS, ABNORMAL 01/07/2007  . Hyperlipidemia 09/22/2006  . Essential hypertension 09/22/2006  . GERD 09/22/2006  . OSTEOARTHRITIS 09/22/2006    Ruben Im, PT 07/02/16 8:38 AM Phone: [phone] Fax: [phone]  Alvera Singh 07/02/2016, 8:38 AM  [facility] [address], [place], [place], [address] Phone: [phone]   Fax:  [phone]  Name: Tranell Wojtkiewicz MRN: [id number] Date of Birth: [birth year]

## 2016-07-05 ENCOUNTER — Ambulatory Visit: Payer: Medicare Other | Admitting: Physical Therapy

## 2016-07-05 DIAGNOSIS — M6281 Muscle weakness (generalized): Secondary | ICD-10-CM

## 2016-07-05 DIAGNOSIS — R279 Unspecified lack of coordination: Secondary | ICD-10-CM

## 2016-07-05 DIAGNOSIS — R296 Repeated falls: Secondary | ICD-10-CM | POA: Diagnosis not present

## 2016-07-05 DIAGNOSIS — M25552 Pain in left hip: Secondary | ICD-10-CM | POA: Diagnosis not present

## 2016-07-05 NOTE — Therapy (Signed)
[facility] [address], [place], [place], [address] Phone: [phone]   Fax:  [phone]  Physical Therapy Treatment  Patient Details  Name: Darin [name] MRN: [id number] Date of Birth: 1931/11/25 Referring Provider: Dr. Elease Hashimoto  Encounter Date: 07/05/2016      PT End of Session - 07/05/16 0804    Visit Number 7   Number of Visits 10   Date for PT Re-Evaluation Sep 03, 2016   Authorization Type G codes visit 10;  KX now (previous PT this year)   PT Start Time 0758   PT Stop Time 0842   PT Time Calculation (min) 44 min   Activity Tolerance Patient tolerated treatment well      Past Medical History:  Diagnosis Date  . Arthritis   . GERD (gastroesophageal reflux disease)   . H/O hiatal hernia    "FOR YEARS" " NO PROBLEM"  . Hyperlipidemia   . Hypertension   . Knee pain   . Neuropathy (HCC)    PERIPHERAL   . Osteoporosis   . PMR (polymyalgia rheumatica) (HCC)   . Polymyalgia ([place]) 5 YEARS    Past Surgical History:  Procedure Laterality Date  . CARPAL TUNNEL RELEASE    . CATARACT EXTRACTION     right  . INGUINAL HERNIA REPAIR  10/14/2011   Procedure: HERNIA REPAIR INGUINAL ADULT;  Surgeon: [name], MD;  Location: [place];  Service: General;  Laterality: Right;  Right Inguinal Hernia Repair with Mesh  . TONSILLECTOMY    . VARICOSE VEIN SURGERY      There were no vitals filed for this visit.      Subjective Assessment - 07/05/16 0754    Subjective My left leg is doing pretty well but my right knee/leg is not.  No increased pain or soreness after last visit.     Currently in Pain? Yes   Pain Score 7    Pain Location Leg   Pain Orientation Right   Pain Type Chronic pain            [facility] PT Assessment - 07/05/16 0001      Dynamic Gait Index   Level Surface Mild Impairment   Change in Gait Speed Mild Impairment   Gait with Horizontal Head Turns Mild Impairment   Gait with Vertical  Head Turns Mild Impairment   Gait and Pivot Turn Mild Impairment   Step Over Obstacle Mild Impairment   Step Around Obstacles Mild Impairment   Steps Moderate Impairment   Total Score 15     Timed Up and Go Test   Manual TUG (seconds) 14.3                     [facility] Adult PT Treatment/Exercise - 07/05/16 0001      Therapeutic Activites    ADL's sit to stand, walking, standing, up and down steps, reaching     Neuro Re-ed    Neuro Re-ed Details  Narrow base of support, partial tandem stand red ball toss with various foot positions min & mod challeng     Knee/Hip Exercises: Stretches   Active Hamstring Stretch Right;Left;5 reps   Active Hamstring Stretch Limitations on step    Other Knee/Hip Stretches stair hip flexor stretch 5x right/left   Other Knee/Hip Stretches calf stretch on step 3x right/left     Knee/Hip Exercises: Aerobic   Stationary Bike 5 min   Nustep L3 10 min seat 11  Therapist present  to discuss treatment                  PT Short Term Goals - 07/05/16 0805      PT SHORT TERM GOAL #1   Title be independent in initial HEP  07/09/16   Time 4   Period Weeks   Status On-going     PT SHORT TERM GOAL #2   Title Conservation officer, nature (ABC) Scale improved to 74% indicating improved confidence in his steadiness with functional activities at home and in the community   Time 4   Period Weeks   Status On-going     PT SHORT TERM GOAL #3   Title The patient will have improved hip flexor muscle length to 5 degrees and HS length to 55 degrees needed for improved stride length with ambulation   Time 4   Period Weeks   Status On-going     PT SHORT TERM GOAL #4   Title BERG balance test improved  to 38/56 indicating improved safety for standing ADLs like grooming and dresssing   Time 4   Period Weeks   Status On-going     PT SHORT TERM GOAL #5   Title Dynamic Gait Index improved to 14 indicating improved balance with dynamic  functional activities like walking and climbing stairs   Time --   Period --   Status Achieved           PT Long Term Goals - 07/05/16 0806      PT LONG TERM GOAL #1   Title be independent in advanced HEP for further improvements in strength, ROM and balance  08/06/16   Time 8   Period Weeks   Status On-going     PT LONG TERM GOAL #2   Title Activities Specific Balance Confidence Scale improved to 79% self confidence in his steadiness with functional activities   Time 8   Period Weeks   Status On-going     PT LONG TERM GOAL #3   Title Dynamic Gait Index improved to 16 indicating improved balance with functional activities like stairs    Time 8   Period Weeks   Status On-going     PT LONG TERM GOAL #4   Title Improved hip and knee strength to at least 4-/5 for greater ease with rising from a chair and  with standing > 10 min    Time 8   Period Weeks   Status On-going     PT LONG TERM GOAL #5   Title The patient will be able to ambulate 800 feet in six minutes needed for shorter distance community ambulation for traveling   Time 8   Period Weeks   Status On-going     PT LONG TERM GOAL #6   Title BERG balance score improved to 40/56 indicating improved safety with walking and standing dynamic activities   Time 8   Period Weeks   Status On-going               Plan - 07/05/16 0808    Clinical Impression Statement The patient continues to have period  dizziness and constant lower leg pain however he continues to put forth good effort with a progression of balance and strengthening exercises with minimal modifications needed.  Good improvement with Dynamic Gait Index and Timed up and go.  Therapist closely monitoring response with all interventions.     PT Next Visit Plan  Nu-Step;  LE strengthening;  standing balance  backwards weight shifting;  narrow base of support, decreased UE support;   therapeutic activities;  KX every visit ;  ABC scale, BERG, 6 min walk  test       Patient will benefit from skilled therapeutic intervention in order to improve the following deficits and impairments:     Visit Diagnosis: Unspecified lack of coordination  Muscle weakness (generalized)  Repeated falls     Problem List Patient Active Problem List   Diagnosis Date Noted  . Leg edema, right 02/20/2015  . BPH (benign prostatic hypertrophy) 12/09/2014  . Chronic radicular low back pain 08/06/2012  . COPD (chronic obstructive pulmonary disease) ([place]) 04/21/2012  . Hyponatremia 03/29/2012  . Localized osteoarthrosis not specified whether primary or secondary, lower leg 06/20/2010  . VARICOSE VEINS LOWER EXTREMITIES W/INFLAMMATION 02/12/2010  . [place] LOCALIZED OSTEOARTHROSIS INVOLVING HAND 10/16/2009  . DUPUYTREN'S CONTRACTURE, RIGHT 07/04/2009  . DEGENERATIVE DISC DISEASE, LUMBOSACRAL SPINE W/RADICULOPATHY 06/19/2009  . CARCINOMA, BASAL CELL 02/07/2009  . ANEMIA OF CHRONIC DISEASE 02/07/2009  . BURSITIS, RIGHT SHOULDER 10/07/2008  . CATARACT ASSOCIATED WITH OTHER SYNDROMES 08/01/2008  . DRY SKIN 08/01/2008  . CERUMEN IMPACTION, BILATERAL 03/01/2008  . CRAMP IN LIMB 01/18/2008  . DEPRESSION, SITUATIONAL, ACUTE 12/28/2007  . CONSTIPATION, DRUG INDUCED 12/28/2007  . POLYNEUROPATHY OTHER DISEASES CLASSIFIED ELSW 09/24/2007  . WEIGHT LOSS, ABNORMAL 01/07/2007  . Hyperlipidemia 09/22/2006  . Essential hypertension 09/22/2006  . GERD 09/22/2006  . OSTEOARTHRITIS 09/22/2006   Ruben Im, PT 07/05/16 8:52 AM Phone: [phone] Fax: [phone]  Alvera Singh 07/05/2016, 8:47 AM  [facility] [address] [place], [place], [address] Phone: [phone]   Fax:  [phone]  Name: Darin [name] MRN: [id number] Date of Birth: 1931-10-04

## 2016-07-09 ENCOUNTER — Ambulatory Visit: Payer: Medicare Other | Admitting: Physical Therapy

## 2016-07-09 DIAGNOSIS — M6281 Muscle weakness (generalized): Secondary | ICD-10-CM | POA: Diagnosis not present

## 2016-07-09 DIAGNOSIS — R296 Repeated falls: Secondary | ICD-10-CM

## 2016-07-09 DIAGNOSIS — R279 Unspecified lack of coordination: Secondary | ICD-10-CM

## 2016-07-09 DIAGNOSIS — M25552 Pain in left hip: Secondary | ICD-10-CM | POA: Diagnosis not present

## 2016-07-09 NOTE — Therapy (Signed)
[facility] [address], [place], [place], [address] Phone: [phone]   Fax:  [phone]  Physical Therapy Treatment  Patient Details  Name: Darin [name] MRN: [id number] Date of Birth: 01-13-1932 Referring Provider: Dr. Elease Hashimoto  Encounter Date: 07/09/2016      PT End of Session - 07/09/16 0840    Visit Number 8   Number of Visits 10   Date for PT Re-Evaluation August 29, 2016   Authorization Type G codes visit 10;  KX now (previous PT this year)   PT Start Time 0800   PT Stop Time 0850   PT Time Calculation (min) 50 min   Activity Tolerance Patient tolerated treatment well      Past Medical History:  Diagnosis Date  . Arthritis   . GERD (gastroesophageal reflux disease)   . H/O hiatal hernia    "FOR YEARS" " NO PROBLEM"  . Hyperlipidemia   . Hypertension   . Knee pain   . Neuropathy    PERIPHERAL   . Osteoporosis   . PMR (polymyalgia rheumatica) (HCC)   . Polymyalgia ([place]) 5 YEARS    Past Surgical History:  Procedure Laterality Date  . CARPAL TUNNEL RELEASE    . CATARACT EXTRACTION     right  . INGUINAL HERNIA REPAIR  10/14/2011   Procedure: HERNIA REPAIR INGUINAL ADULT;  Surgeon: [name], MD;  Location: [place];  Service: General;  Laterality: Right;  Right Inguinal Hernia Repair with Mesh  . TONSILLECTOMY    . VARICOSE VEIN SURGERY      There were no vitals filed for this visit.      Subjective Assessment - 07/09/16 0807    Subjective When I got up this morning, my left leg was terrible but it has improved since then. It's not usually like that.  No more hip pain recently.    Currently in Pain? Yes   Pain Score 6    Pain Location Leg   Pain Orientation Right   Pain Type Chronic pain   Pain Frequency Constant   Aggravating Factors  mornings   Pain Relieving Factors nothing in particular            [facility] PT Assessment - 07/09/16 0001      Observation/Other Assessments   Activities of Balance Confidence Scale (ABC Scale)  79% balance confidence     Flexibility   Soft Tissue Assessment /Muscle Length --  HS 55, hip flexors to 5 degrees     6 minute walk test results    Aerobic Endurance Distance Walked 760     Berg Balance Test   Sit to Stand Able to stand  independently using hands   Standing Unsupported Able to stand safely 2 minutes   Sitting with Back Unsupported but Feet Supported on Floor or Stool Able to sit safely and securely 2 minutes   Stand to Sit Controls descent by using hands   Transfers Able to transfer safely, definite need of hands   Standing Unsupported with Eyes Closed Able to stand 10 seconds with supervision   Standing Ubsupported with Feet Together Needs help to attain position but able to stand for 30 seconds with feet together   From Standing, Reach Forward with Outstretched Arm Can reach forward >12 cm safely (5")   From Standing Position, Pick up Object from Floor Able to pick up shoe, needs supervision   From Standing Position, Turn to Look Behind Over each Shoulder Looks behind one  side only/other side shows less weight shift   Turn 360 Degrees Able to turn 360 degrees safely but slowly   Standing Unsupported, Alternately Place Feet on Step/Stool Able to complete 4 steps without aid or supervision   Standing Unsupported, One Foot in Front Able to take small step independently and hold 30 seconds   Standing on One Leg Tries to lift leg/unable to hold 3 seconds but remains standing independently   Total Score 37                     [facility] Adult PT Treatment/Exercise - 07/09/16 0001      Therapeutic Activites    ADL's sit to stand, walking, standing, up and down steps     Neuro Re-ed    Neuro Re-ed Details  single limb and narrow base of support mild balance perturbances     Lumbar Exercises: Machines for Strengthening   Leg Press Seat 9 95#, single leg 55# 50x each     Knee/Hip Exercises: Aerobic    Stationary Bike --   Nustep L3 10 min seat 11  Therapist present to discuss treatment     Knee/Hip Exercises: Standing   Other Standing Knee Exercises 4 ways  15x right/left                   PT Short Term Goals - 07/09/16 0856      PT SHORT TERM GOAL #1   Title be independent in initial HEP  07/09/16   Status Achieved     PT SHORT TERM GOAL #2   Title Activities-Specific Balance Confidence (ABC) Scale improved to 74% indicating improved confidence in his steadiness with functional activities at home and in the community   Status Achieved     PT SHORT TERM GOAL #3   Title The patient will have improved hip flexor muscle length to 5 degrees and HS length to 55 degrees needed for improved stride length with ambulation   Status Achieved     PT SHORT TERM GOAL #4   Title BERG balance test improved  to 38/56 indicating improved safety for standing ADLs like grooming and dresssing   Status On-going     PT SHORT TERM GOAL #5   Title Dynamic Gait Index improved to 14 indicating improved balance with dynamic functional activities like walking and climbing stairs   Status Achieved           PT Long Term Goals - 07/09/16 0858      PT LONG TERM GOAL #1   Title be independent in advanced HEP for further improvements in strength, ROM and balance  08/06/16   Time 8   Period Weeks   Status On-going     PT LONG TERM GOAL #2   Title Activities Specific Balance Confidence Scale improved to 79% self confidence in his steadiness with functional activities   Status Achieved     PT LONG TERM GOAL #3   Title Dynamic Gait Index improved to 16 indicating improved balance with functional activities like stairs    Time 8   Period Weeks   Status On-going     PT LONG TERM GOAL #4   Title Improved hip and knee strength to at least 4-/5 for greater ease with rising from a chair and  with standing > 10 min    Time 8   Period Weeks   Status On-going     PT LONG TERM GOAL #5   Title  The patient will be able to ambulate 800 feet in six minutes needed for shorter distance community ambulation for traveling   Time 8   Period Weeks   Status On-going     PT LONG TERM GOAL #6   Title BERG balance score improved to 40/56 indicating improved safety with walking and standing dynamic activities   Time 8   Period Weeks   Status On-going               Plan - 07/09/16 0840    Clinical Impression Statement Good improvement in Activities Based Confidence Scale from 69% to 79%.  Min change in BERG Balance with 1 point improvement.  Remains at high risk for falls  Six minute walk test improved by 40 feet.  Majority of STGs met.  The patient's progress is slowed by neuropathy in feet, age and dizziness.  He would benefit from continued PT to address balance deficits considering his active lifestyle (traveling) and his high risk for falls.     Clinical Impairments Affecting Rehab Potential high risk of falls   PT Next Visit Plan  Nu-Step;  LE strengthening;  standing balance backwards weight shifting;  narrow base of support, decreased UE support;   therapeutic activities;  KX every visit       Patient will benefit from skilled therapeutic intervention in order to improve the following deficits and impairments:     Visit Diagnosis: Unspecified lack of coordination  Muscle weakness (generalized)  Repeated falls     Problem List Patient Active Problem List   Diagnosis Date Noted  . Leg edema, right 02/20/2015  . BPH (benign prostatic hypertrophy) 12/09/2014  . Chronic radicular low back pain 08/06/2012  . COPD (chronic obstructive pulmonary disease) ([place]) 04/21/2012  . Hyponatremia 03/29/2012  . Localized osteoarthrosis not specified whether primary or secondary, lower leg 06/20/2010  . VARICOSE VEINS LOWER EXTREMITIES W/INFLAMMATION 02/12/2010  . [place] LOCALIZED OSTEOARTHROSIS INVOLVING HAND 10/16/2009  . DUPUYTREN'S CONTRACTURE, RIGHT 07/04/2009  . DEGENERATIVE  DISC DISEASE, LUMBOSACRAL SPINE W/RADICULOPATHY 06/19/2009  . CARCINOMA, BASAL CELL 02/07/2009  . ANEMIA OF CHRONIC DISEASE 02/07/2009  . BURSITIS, RIGHT SHOULDER 10/07/2008  . CATARACT ASSOCIATED WITH OTHER SYNDROMES 08/01/2008  . DRY SKIN 08/01/2008  . CERUMEN IMPACTION, BILATERAL 03/01/2008  . CRAMP IN LIMB 01/18/2008  . DEPRESSION, SITUATIONAL, ACUTE 12/28/2007  . CONSTIPATION, DRUG INDUCED 12/28/2007  . POLYNEUROPATHY OTHER DISEASES CLASSIFIED ELSW 09/24/2007  . WEIGHT LOSS, ABNORMAL 01/07/2007  . Hyperlipidemia 09/22/2006  . Essential hypertension 09/22/2006  . GERD 09/22/2006  . OSTEOARTHRITIS 09/22/2006   Ruben Im, PT 07/09/16 9:00 AM Phone: [phone] Fax: [phone]  Alvera Singh 07/09/2016, 8:59 AM  [facility] [address] [place], [place], [address] Phone: [phone]   Fax:  [phone]  Name: Darin [name] MRN: [id number] Date of Birth: [birth year]

## 2016-07-12 ENCOUNTER — Ambulatory Visit: Payer: Medicare Other | Admitting: Physical Therapy

## 2016-07-12 DIAGNOSIS — M6281 Muscle weakness (generalized): Secondary | ICD-10-CM | POA: Diagnosis not present

## 2016-07-12 DIAGNOSIS — R296 Repeated falls: Secondary | ICD-10-CM

## 2016-07-12 DIAGNOSIS — R279 Unspecified lack of coordination: Secondary | ICD-10-CM

## 2016-07-12 DIAGNOSIS — M25552 Pain in left hip: Secondary | ICD-10-CM | POA: Diagnosis not present

## 2016-07-12 NOTE — Therapy (Signed)
[facility] [address] [place], [place], [address] Phone: [phone]   Fax:  [phone]  Physical Therapy Treatment  Patient Details  Name: Darin [name] MRN: [id number] Date of Birth: 08-Dec-1931 Referring Provider: Dr. Elease Hashimoto  Encounter Date: 07/12/2016      PT End of Session - 07/12/16 0816    Visit Number 9   Number of Visits 10   Date for PT Re-Evaluation 08/31/2016   Authorization Type G codes visit 10;  KX now (previous PT this year)   PT Start Time 0800   PT Stop Time 0850   PT Time Calculation (min) 50 min   Activity Tolerance Patient tolerated treatment well      Past Medical History:  Diagnosis Date  . Arthritis   . GERD (gastroesophageal reflux disease)   . H/O hiatal hernia    "FOR YEARS" " NO PROBLEM"  . Hyperlipidemia   . Hypertension   . Knee pain   . Neuropathy    PERIPHERAL   . Osteoporosis   . PMR (polymyalgia rheumatica) (HCC)   . Polymyalgia ([place]) 5 YEARS    Past Surgical History:  Procedure Laterality Date  . CARPAL TUNNEL RELEASE    . CATARACT EXTRACTION     right  . INGUINAL HERNIA REPAIR  10/14/2011   Procedure: HERNIA REPAIR INGUINAL ADULT;  Surgeon: [name], MD;  Location: [place];  Service: General;  Laterality: Right;  Right Inguinal Hernia Repair with Mesh  . TONSILLECTOMY    . VARICOSE VEIN SURGERY      There were no vitals filed for this visit.      Subjective Assessment - 07/12/16 0805    Subjective I'm really stiff this morning especially my knee.  It doesn't hurt but it cracks.  Usual lower leg pain that never goes away.   Pertinent History chronic lumbar DDD, polyneuropathy;  HTN   Currently in Pain? Yes   Pain Score 6    Pain Location Leg   Pain Orientation Right;Left   Pain Type Chronic pain   Pain Onset More than a month ago   Pain Frequency Constant                         [facility] Adult PT Treatment/Exercise - 07/12/16  0001      Therapeutic Activites    ADL's sit to stand, walking, standing, up and down steps     Neuro Re-ed    Neuro Re-ed Details  single limb and narrow base of support mild balance perturbances     Knee/Hip Exercises: Stretches   Active Hamstring Stretch Right;Left;5 reps   Active Hamstring Stretch Limitations on step    Other Knee/Hip Stretches stair hip flexor stretch 5x right/left   Other Knee/Hip Stretches calf stretch on step 3x right/left     Knee/Hip Exercises: Aerobic   Stationary Bike 6 min   Nustep L3 10 min seat 11  Therapist present to discuss treatment     Knee/Hip Exercises: Standing   Other Standing Knee Exercises psoas doorway stretch with and without UE movements 3x5 right/left   Other Standing Knee Exercises tandem stand with red band diagonals 15x     Knee/Hip Exercises: Seated   Clamshell with TheraBand Blue  30x   Marching Limitations march with blue band 15x                  PT Short Term Goals - 07/12/16  0821      PT SHORT TERM GOAL #1   Title be independent in initial HEP  07/09/16   Status Achieved     PT SHORT TERM GOAL #2   Title Activities-Specific Balance Confidence (ABC) Scale improved to 74% indicating improved confidence in his steadiness with functional activities at home and in the community   Status Achieved     PT SHORT TERM GOAL #3   Title The patient will have improved hip flexor muscle length to 5 degrees and HS length to 55 degrees needed for improved stride length with ambulation   Status Achieved     PT SHORT TERM GOAL #4   Title BERG balance test improved  to 38/56 indicating improved safety for standing ADLs like grooming and dresssing   Time 4   Period Weeks   Status On-going     PT SHORT TERM GOAL #5   Title Dynamic Gait Index improved to 14 indicating improved balance with dynamic functional activities like walking and climbing stairs   Status Achieved           PT Long Term Goals - 07/12/16 4097       PT LONG TERM GOAL #1   Title be independent in advanced HEP for further improvements in strength, ROM and balance  08/06/16   Time 8   Period Weeks   Status On-going     PT LONG TERM GOAL #2   Title Activities Specific Balance Confidence Scale improved to 79% self confidence in his steadiness with functional activities   Status Achieved     PT LONG TERM GOAL #3   Title Dynamic Gait Index improved to 16 indicating improved balance with functional activities like stairs    Time 8   Period Weeks   Status On-going     PT LONG TERM GOAL #4   Title Improved hip and knee strength to at least 4-/5 for greater ease with rising from a chair and  with standing > 10 min    Time 8   Period Weeks   Status On-going     PT LONG TERM GOAL #5   Title The patient will be able to ambulate 800 feet in six minutes needed for shorter distance community ambulation for traveling   Time 8   Period Weeks   Status On-going     PT LONG TERM GOAL #6   Title BERG balance score improved to 40/56 indicating improved safety with walking and standing dynamic activities   Time 8   Period Weeks   Status On-going               Plan - 07/12/16 3532    Clinical Impression Statement The patient has increased LE stiffness today and as a result short shuffled ambulation.  Continued difficulty with narrow base of support balance.  Dizziness produced with prolonged standing and  change of position (sit to stand).   Modifications needed today secondary to dizziness.  Close supervision for safety.     PT Frequency 2x / week   PT Duration 8 weeks   PT Treatment/Interventions ADLs/Self Care Home Management;Electrical Stimulation;Moist Heat;Therapeutic exercise;Therapeutic activities;Balance training;Neuromuscular re-education;Patient/family education;Taping;Manual techniques   PT Next Visit Plan  Nu-Step;  LE strengthening;  standing balance backwards weight shifting;  narrow base of support, decreased UE support;    therapeutic activities;  KX every visit;  G code next visit       Patient will benefit from skilled therapeutic intervention in order  to improve the following deficits and impairments:  Decreased activity tolerance, Difficulty walking, Decreased strength, Pain, Decreased endurance, Abnormal gait, Decreased balance, Decreased coordination  Visit Diagnosis: Unspecified lack of coordination  Muscle weakness (generalized)  Repeated falls     Problem List Patient Active Problem List   Diagnosis Date Noted  . Leg edema, right 02/20/2015  . BPH (benign prostatic hypertrophy) 12/09/2014  . Chronic radicular low back pain 08/06/2012  . COPD (chronic obstructive pulmonary disease) ([place]) 04/21/2012  . Hyponatremia 03/29/2012  . Localized osteoarthrosis not specified whether primary or secondary, lower leg 06/20/2010  . VARICOSE VEINS LOWER EXTREMITIES W/INFLAMMATION 02/12/2010  . [place] LOCALIZED OSTEOARTHROSIS INVOLVING HAND 10/16/2009  . DUPUYTREN'S CONTRACTURE, RIGHT 07/04/2009  . DEGENERATIVE DISC DISEASE, LUMBOSACRAL SPINE W/RADICULOPATHY 06/19/2009  . CARCINOMA, BASAL CELL 02/07/2009  . ANEMIA OF CHRONIC DISEASE 02/07/2009  . BURSITIS, RIGHT SHOULDER 10/07/2008  . CATARACT ASSOCIATED WITH OTHER SYNDROMES 08/01/2008  . DRY SKIN 08/01/2008  . CERUMEN IMPACTION, BILATERAL 03/01/2008  . CRAMP IN LIMB 01/18/2008  . DEPRESSION, SITUATIONAL, ACUTE 12/28/2007  . CONSTIPATION, DRUG INDUCED 12/28/2007  . POLYNEUROPATHY OTHER DISEASES CLASSIFIED ELSW 09/24/2007  . WEIGHT LOSS, ABNORMAL 01/07/2007  . Hyperlipidemia 09/22/2006  . Essential hypertension 09/22/2006  . GERD 09/22/2006  . OSTEOARTHRITIS 09/22/2006   Ruben Im, PT 07/12/16 8:45 AM Phone: [phone] Fax: [phone]  Alvera Singh 07/12/2016, 8:45 AM  [facility] [address] [place], [place], [address] Phone: [phone]   Fax:   [phone]  Name: Darin [name] MRN: [id number] Date of Birth: 27-Aug-1931

## 2016-07-16 ENCOUNTER — Ambulatory Visit: Payer: Medicare Other | Admitting: Physical Therapy

## 2016-07-16 DIAGNOSIS — M25552 Pain in left hip: Secondary | ICD-10-CM | POA: Diagnosis not present

## 2016-07-16 DIAGNOSIS — R279 Unspecified lack of coordination: Secondary | ICD-10-CM

## 2016-07-16 DIAGNOSIS — M6281 Muscle weakness (generalized): Secondary | ICD-10-CM

## 2016-07-16 DIAGNOSIS — R296 Repeated falls: Secondary | ICD-10-CM

## 2016-07-16 NOTE — Therapy (Signed)
[facility] [address] [place], [place], [address] Phone: [phone]   Fax:  [phone]  Physical Therapy Treatment  Patient Details  Name: Darin [name] MRN: [id number] Date of Birth: May 25, 1931 Referring Provider: Dr. Elease Hashimoto  Encounter Date: 07/16/2016      PT End of Session - 07/16/16 1045    Visit Number 10   Number of Visits 20   Date for PT Re-Evaluation 09-02-16   Authorization Type G codes visit 10;  KX now (previous PT this year)   PT Start Time 0811   PT Stop Time 0855   PT Time Calculation (min) 44 min   Activity Tolerance Patient tolerated treatment well      Past Medical History:  Diagnosis Date  . Arthritis   . GERD (gastroesophageal reflux disease)   . H/O hiatal hernia    "FOR YEARS" " NO PROBLEM"  . Hyperlipidemia   . Hypertension   . Knee pain   . Neuropathy    PERIPHERAL   . Osteoporosis   . PMR (polymyalgia rheumatica) (HCC)   . Polymyalgia ([place]) 5 YEARS    Past Surgical History:  Procedure Laterality Date  . CARPAL TUNNEL RELEASE    . CATARACT EXTRACTION     right  . INGUINAL HERNIA REPAIR  10/14/2011   Procedure: HERNIA REPAIR INGUINAL ADULT;  Surgeon: [name], MD;  Location: [facility];  Service: General;  Laterality: Right;  Right Inguinal Hernia Repair with Mesh  . TONSILLECTOMY    . VARICOSE VEIN SURGERY      There were no vitals filed for this visit.      Subjective Assessment - 07/16/16 0812    Subjective Arrives 10 min late.  Patient reports increased aches and pains in response to the weather system that has come through.  A couple of nights ago, I thought I would have to go on Prednisone but it eased off a bit.  Bilateral lower leg pain.     Currently in Pain? Yes   Pain Score 7    Pain Location Leg   Pain Orientation Right;Left   Pain Type Chronic pain   Pain Onset More than a month ago   Pain Frequency Constant   Aggravating Factors  night  time/mornings   Pain Relieving Factors nothing            [facility] PT Assessment - 07/16/16 0001      Observation/Other Assessments   Activities of Balance Confidence Scale (ABC Scale)  79% balance confidence                     [facility] Adult PT Treatment/Exercise - 07/16/16 0001      Therapeutic Activites    ADL's sit to stand, walking, standing, up and down steps     Neuro Re-ed    Neuro Re-ed Details  single limb and narrow base of support mild balance perturbances     Knee/Hip Exercises: Stretches   Active Hamstring Stretch Right;Left;5 reps   Active Hamstring Stretch Limitations on step    Other Knee/Hip Stretches stair hip flexor stretch 5x right/left   Other Knee/Hip Stretches calf stretch on step 3x right/left     Knee/Hip Exercises: Aerobic   Stationary Bike 6 min   Nustep L3 10 min seat 11  Therapist present to discuss treatment     Knee/Hip Exercises: Standing   Other Standing Knee Exercises step taps on BOSU   Other Standing Knee  Exercises staggered stand with foot on BOSU with UE movements                  PT Short Term Goals - 07/26/2016 1055      PT SHORT TERM GOAL #1   Title be independent in initial HEP  07/09/16   Status Achieved     PT SHORT TERM GOAL #2   Title Activities-Specific Balance Confidence (ABC) Scale improved to 74% indicating improved confidence in his steadiness with functional activities at home and in the community   Status Achieved           PT Long Term Goals - 07-26-2016 [employer] #1   Title be independent in advanced HEP for further improvements in strength, ROM and balance  08/06/16   Time 8   Period Weeks   Status On-going     PT LONG TERM GOAL #2   Title Activities Specific Balance Confidence Scale improved to 79% self confidence in his steadiness with functional activities   Status Achieved     PT LONG TERM GOAL #3   Title Dynamic Gait Index improved to 16 indicating improved  balance with functional activities like stairs    Time 8   Period Weeks   Status On-going     PT LONG TERM GOAL #4   Title Improved hip and knee strength to at least 4-/5 for greater ease with rising from a chair and  with standing > 10 min    Time 8   Period Weeks   Status On-going     PT LONG TERM GOAL #5   Title The patient will be able to ambulate 800 feet in six minutes needed for shorter distance community ambulation for traveling   Time 8   Period Weeks   Status On-going     PT LONG TERM GOAL #6   Title BERG balance score improved to 40/56 indicating improved safety with walking and standing dynamic activities   Time 8   Period Weeks   Status On-going               Plan - 07/26/2016 1046    Clinical Impression Statement The patient reports some increased pain in his lower legs but states he does not attribute this to PT or exercises.  He continues to have difficulty with single leg standing and narrow base of support.  Heavily dependent on UEs to go up and down the steps reciprocally.  One at a time pattern on  steps with 1 arm support.  Close supervision for safety and to monitor for dizziness.     Clinical Impairments Affecting Rehab Potential high risk of falls   PT Treatment/Interventions ADLs/Self Care Home Management;Electrical Stimulation;Moist Heat;Therapeutic exercise;Therapeutic activities;Balance training;Neuromuscular re-education;Patient/family education;Taping;Manual techniques   PT Next Visit Plan  Nu-Step;  LE strengthening;  standing balance backwards weight shifting;  narrow base of support, decreased UE support;   therapeutic activities;  KX every visit    Recommended Other Services Certification signed      Patient will benefit from skilled therapeutic intervention in order to improve the following deficits and impairments:  Decreased activity tolerance, Difficulty walking, Decreased strength, Pain, Decreased endurance, Abnormal gait, Decreased  balance, Decreased coordination  Visit Diagnosis: Unspecified lack of coordination  Muscle weakness (generalized)  Repeated falls       G-Codes - 07/26/2016 1057    Functional Assessment Tool Used (Outpatient Only) Clinical judgement;  ABC  scale   Functional Limitation Mobility: Walking and moving around   Mobility: Walking and Moving Around Current Status [id number]) At least 40 percent but less than 60 percent impaired, limited or restricted   Mobility: Walking and Moving Around Goal Status [id number]) At least 20 percent but less than 40 percent impaired, limited or restricted      Problem List Patient Active Problem List   Diagnosis Date Noted  . Leg edema, right 02/20/2015  . BPH (benign prostatic hypertrophy) 12/09/2014  . Chronic radicular low back pain 08/06/2012  . COPD (chronic obstructive pulmonary disease) ([place]) 04/21/2012  . Hyponatremia 03/29/2012  . Localized osteoarthrosis not specified whether primary or secondary, lower leg 06/20/2010  . VARICOSE VEINS LOWER EXTREMITIES W/INFLAMMATION 02/12/2010  . [place] LOCALIZED OSTEOARTHROSIS INVOLVING HAND 10/16/2009  . DUPUYTREN'S CONTRACTURE, RIGHT 07/04/2009  . DEGENERATIVE DISC DISEASE, LUMBOSACRAL SPINE W/RADICULOPATHY 06/19/2009  . CARCINOMA, BASAL CELL 02/07/2009  . ANEMIA OF CHRONIC DISEASE 02/07/2009  . BURSITIS, RIGHT SHOULDER 10/07/2008  . CATARACT ASSOCIATED WITH OTHER SYNDROMES 08/01/2008  . DRY SKIN 08/01/2008  . CERUMEN IMPACTION, BILATERAL 03/01/2008  . CRAMP IN LIMB 01/18/2008  . DEPRESSION, SITUATIONAL, ACUTE 12/28/2007  . CONSTIPATION, DRUG INDUCED 12/28/2007  . POLYNEUROPATHY OTHER DISEASES CLASSIFIED ELSW 09/24/2007  . WEIGHT LOSS, ABNORMAL 01/07/2007  . Hyperlipidemia 09/22/2006  . Essential hypertension 09/22/2006  . GERD 09/22/2006  . OSTEOARTHRITIS 09/22/2006   Ruben Im, PT 07/16/16 11:00 AM Phone: [phone] Fax: [phone]  Alvera Singh 07/16/2016, 11:00 AM  [facility] [address], [place], [place], [address] Phone: [phone]   Fax:  [phone]  Name: Darin [name] MRN: [id number] Date of Birth: Jul 23, 1931

## 2016-07-17 ENCOUNTER — Telehealth: Payer: Self-pay | Admitting: Family Medicine

## 2016-07-17 NOTE — Telephone Encounter (Signed)
Pt would like to have pednisone for the L side sciatia pain that is radiating down to his ankle.  Pt refuse appointment state he would be wasting Dr. Anastasio Auerbach time.  Pharm:  [facility]

## 2016-07-17 NOTE — Telephone Encounter (Signed)
Left message on machine for patient to return our call 

## 2016-07-17 NOTE — Telephone Encounter (Signed)
Needs to be seen

## 2016-07-18 NOTE — Telephone Encounter (Signed)
Pt still decline to make an appt

## 2016-07-19 ENCOUNTER — Encounter: Payer: Self-pay | Admitting: Physical Therapy

## 2016-07-19 ENCOUNTER — Ambulatory Visit (INDEPENDENT_AMBULATORY_CARE_PROVIDER_SITE_OTHER): Payer: Medicare Other | Admitting: Family Medicine

## 2016-07-19 ENCOUNTER — Encounter: Payer: Self-pay | Admitting: Family Medicine

## 2016-07-19 ENCOUNTER — Ambulatory Visit: Payer: Medicare Other | Admitting: Physical Therapy

## 2016-07-19 VITALS — BP 140/70 | HR 94 | Temp 98.1°F

## 2016-07-19 DIAGNOSIS — M5416 Radiculopathy, lumbar region: Secondary | ICD-10-CM

## 2016-07-19 DIAGNOSIS — R296 Repeated falls: Secondary | ICD-10-CM | POA: Diagnosis not present

## 2016-07-19 DIAGNOSIS — M6281 Muscle weakness (generalized): Secondary | ICD-10-CM | POA: Diagnosis not present

## 2016-07-19 DIAGNOSIS — R279 Unspecified lack of coordination: Secondary | ICD-10-CM

## 2016-07-19 DIAGNOSIS — M25552 Pain in left hip: Secondary | ICD-10-CM | POA: Diagnosis not present

## 2016-07-19 MED ORDER — PREDNISONE 10 MG PO TABS: ORAL_TABLET | ORAL | 0 refills | Status: DC

## 2016-07-19 NOTE — Telephone Encounter (Signed)
noted 

## 2016-07-19 NOTE — Therapy (Signed)
[facility] [address] [place], [place], [address] Phone: [phone]   Fax:  [phone]  Physical Therapy Treatment  Patient Details  Name: Darin [name] MRN: [id number] Date of Birth: 1931-10-05 Referring Provider: Dr. Elease Hashimoto  Encounter Date: 07/19/2016      PT End of Session - 07/19/16 0808    Visit Number 11   Number of Visits 20   Date for PT Re-Evaluation Aug 22, 2016   Authorization Type G codes visit 10;  KX now (previous PT this year)   PT Start Time 0806   PT Stop Time 0853   PT Time Calculation (min) 47 min   Activity Tolerance Patient tolerated treatment well   Behavior During Therapy [facility] for tasks assessed/performed      Past Medical History:  Diagnosis Date  . Arthritis   . GERD (gastroesophageal reflux disease)   . H/O hiatal hernia    "FOR YEARS" " NO PROBLEM"  . Hyperlipidemia   . Hypertension   . Knee pain   . Neuropathy    PERIPHERAL   . Osteoporosis   . PMR (polymyalgia rheumatica) (HCC)   . Polymyalgia ([place]) 5 YEARS    Past Surgical History:  Procedure Laterality Date  . CARPAL TUNNEL RELEASE    . CATARACT EXTRACTION     right  . INGUINAL HERNIA REPAIR  10/14/2011   Procedure: HERNIA REPAIR INGUINAL ADULT;  Surgeon: [name], MD;  Location: [facility];  Service: General;  Laterality: Right;  Right Inguinal Hernia Repair with Mesh  . TONSILLECTOMY    . VARICOSE VEIN SURGERY      There were no vitals filed for this visit.      Subjective Assessment - 07/19/16 0807    Subjective Pt reports usual soreness and sciatica that is worse due to weather.    Pertinent History chronic lumbar DDD, polyneuropathy;  HTN   Limitations Walking;House hold activities;Standing   How long can you sit comfortably? as long as  I want but trouble rising following    Diagnostic tests none recently   Patient Stated Goals walk normally;  maintain my balance   Currently in Pain? Yes   Pain  Score 7    Pain Location Leg   Pain Orientation Right;Left   Pain Descriptors / Indicators Sore   Pain Type Chronic pain   Pain Onset More than a month ago   Pain Frequency Constant                         [facility] Adult PT Treatment/Exercise - 07/19/16 0001      Therapeutic Activites    ADL's sit to stand, walking, standing, up and down steps     Neuro Re-ed    Neuro Re-ed Details  single limb and narrow base of support mild balance perturbances     Exercises   Exercises Other Exercises   Other Exercises  Sitting on BOSU on mat UE movements  flexion abduciton scaption     Knee/Hip Exercises: Stretches   Active Hamstring Stretch Right;Left;5 reps   Active Hamstring Stretch Limitations on step    Other Knee/Hip Stretches stair hip flexor stretch 5x right/left   Other Knee/Hip Stretches calf stretch on step 3x right/left     Knee/Hip Exercises: Aerobic   Stationary Bike 6 min   Nustep L3 10 min seat 11  Therapist present to discuss treatment     Knee/Hip Exercises: Standing   Other  Standing Knee Exercises step taps on BOSU   Other Standing Knee Exercises staggered stand with foot on BOSU with UE movements  10 seconds at a time; CGA     Knee/Hip Exercises: Seated   Sit to Sand 20 reps  From BOSU                  PT Short Term Goals - 07/16/16 1055      PT SHORT TERM GOAL #1   Title be independent in initial HEP  07/09/16   Status Achieved     PT SHORT TERM GOAL #2   Title Activities-Specific Balance Confidence (ABC) Scale improved to 74% indicating improved confidence in his steadiness with functional activities at home and in the community   Status Achieved           PT Long Term Goals - 07/16/16 [place] #1   Title be independent in advanced HEP for further improvements in strength, ROM and balance  08/06/16   Time 8   Period Weeks   Status On-going     PT LONG TERM GOAL #2   Title Activities Specific Balance  Confidence Scale improved to 79% self confidence in his steadiness with functional activities   Status Achieved     PT LONG TERM GOAL #3   Title Dynamic Gait Index improved to 16 indicating improved balance with functional activities like stairs    Time 8   Period Weeks   Status On-going     PT LONG TERM GOAL #4   Title Improved hip and knee strength to at least 4-/5 for greater ease with rising from a chair and  with standing > 10 min    Time 8   Period Weeks   Status On-going     PT LONG TERM GOAL #5   Title The patient will be able to ambulate 800 feet in six minutes needed for shorter distance community ambulation for traveling   Time 8   Period Weeks   Status On-going     PT LONG TERM GOAL #6   Title BERG balance score improved to 40/56 indicating improved safety with walking and standing dynamic activities   Time 8   Period Weeks   Status On-going               Plan - 07/19/16 1601    Clinical Impression Statement Patient having some increased leg soreness today possibly due to weather. Pt having difficuty with standing balance exercises today. Did well with stairs and bike today. Patient will continue to benefit from skilled therapy for LE and core strength and stability.    Rehab Potential Good   Clinical Impairments Affecting Rehab Potential high risk of falls   PT Frequency 2x / week   PT Duration 8 weeks   PT Treatment/Interventions ADLs/Self Care Home Management;Electrical Stimulation;Moist Heat;Therapeutic exercise;Therapeutic activities;Balance training;Neuromuscular re-education;Patient/family education;Taping;Manual techniques   PT Next Visit Plan  Nu-Step;  LE strengthening;  standing balance backwards weight shifting;  narrow base of support, decreased UE support;   therapeutic activities;  KX every visit    Consulted and Agree with Plan of Care Patient      Patient will benefit from skilled therapeutic intervention in order to improve the following  deficits and impairments:  Decreased activity tolerance, Difficulty walking, Decreased strength, Pain, Decreased endurance, Abnormal gait, Decreased balance, Decreased coordination  Visit Diagnosis: Unspecified lack of coordination  Muscle weakness (generalized)  Repeated falls     Problem List Patient Active Problem List   Diagnosis Date Noted  . Leg edema, right 02/20/2015  . BPH (benign prostatic hypertrophy) 12/09/2014  . Chronic radicular low back pain 08/06/2012  . COPD (chronic obstructive pulmonary disease) ([place]) 04/21/2012  . Hyponatremia 03/29/2012  . Localized osteoarthrosis not specified whether primary or secondary, lower leg 06/20/2010  . VARICOSE VEINS LOWER EXTREMITIES W/INFLAMMATION 02/12/2010  . [place] LOCALIZED OSTEOARTHROSIS INVOLVING HAND 10/16/2009  . DUPUYTREN'S CONTRACTURE, RIGHT 07/04/2009  . DEGENERATIVE DISC DISEASE, LUMBOSACRAL SPINE W/RADICULOPATHY 06/19/2009  . CARCINOMA, BASAL CELL 02/07/2009  . ANEMIA OF CHRONIC DISEASE 02/07/2009  . BURSITIS, RIGHT SHOULDER 10/07/2008  . CATARACT ASSOCIATED WITH OTHER SYNDROMES 08/01/2008  . DRY SKIN 08/01/2008  . CERUMEN IMPACTION, BILATERAL 03/01/2008  . CRAMP IN LIMB 01/18/2008  . DEPRESSION, SITUATIONAL, ACUTE 12/28/2007  . CONSTIPATION, DRUG INDUCED 12/28/2007  . POLYNEUROPATHY OTHER DISEASES CLASSIFIED ELSW 09/24/2007  . WEIGHT LOSS, ABNORMAL 01/07/2007  . Hyperlipidemia 09/22/2006  . Essential hypertension 09/22/2006  . GERD 09/22/2006  . OSTEOARTHRITIS 09/22/2006    Mikle Bosworth PTA 07/19/2016, 8:56 AM  [facility] [address] [place], [place], [address] Phone: [phone]   Fax:  [phone]  Name: Darin [name] MRN: [id number] Date of Birth: 28-Dec-1931

## 2016-07-19 NOTE — Patient Instructions (Signed)
Sciatica Sciatica is pain, numbness, weakness, or tingling along the path of the sciatic nerve. The sciatic nerve starts in the lower back and runs down the back of each leg. The nerve controls the muscles in the lower leg and in the back of the knee. It also provides feeling (sensation) to the back of the thigh, the lower leg, and the sole of the foot. Sciatica is a symptom of another medical condition that pinches or puts pressure on the sciatic nerve. Generally, sciatica only affects one side of the body. Sciatica usually goes away on its own or with treatment. In some cases, sciatica may keep coming back (recur). What are the causes? This condition is caused by pressure on the sciatic nerve, or pinching of the sciatic nerve. This may be the result of:  A disk in between the bones of the spine (vertebrae) bulging out too far (herniated disk).  Age-related changes in the spinal disks (degenerative disk disease).  A pain disorder that affects a muscle in the buttock (piriformis syndrome).  Extra bone growth (bone spur) near the sciatic nerve.  An injury or break (fracture) of the pelvis.  Pregnancy.  Tumor (rare). What increases the risk? The following factors may make you more likely to develop this condition:  Playing sports that place pressure or stress on the spine, such as football or weight lifting.  Having poor strength and flexibility.  A history of back injury.  A history of back surgery.  Sitting for long periods of time.  Doing activities that involve repetitive bending or lifting.  Obesity. What are the signs or symptoms? Symptoms can vary from mild to very severe, and they may include:  Any of these problems in the lower back, leg, hip, or buttock:  Mild tingling or dull aches.  Burning sensations.  Sharp pains.  Numbness in the back of the calf or the sole of the foot.  Leg weakness.  Severe back pain that makes movement difficult. These symptoms may  get worse when you cough, sneeze, or laugh, or when you sit or stand for long periods of time. Being overweight may also make symptoms worse. In some cases, symptoms may recur over time. How is this diagnosed? This condition may be diagnosed based on:  Your symptoms.  A physical exam. Your health care provider may ask you to do certain movements to check whether those movements trigger your symptoms.  You may have tests, including:  Blood tests.  X-rays.  MRI.  CT scan. How is this treated? In many cases, this condition improves on its own, without any treatment. However, treatment may include:  Reducing or modifying physical activity during periods of pain.  Exercising and stretching to strengthen your abdomen and improve the flexibility of your spine.  Icing and applying heat to the affected area.  Medicines that help:  To relieve pain and swelling.  To relax your muscles.  Injections of medicines that help to relieve pain, irritation, and inflammation around the sciatic nerve (steroids).  Surgery. Follow these instructions at home: Medicines   Take over-the-counter and prescription medicines only as told by your health care provider.  Do not drive or operate heavy machinery while taking prescription pain medicine. Managing pain   If directed, apply ice to the affected area.  Put ice in a plastic bag.  Place a towel between your skin and the bag.  Leave the ice on for 20 minutes, 2-3 times a day.  After icing, apply heat to the   affected area before you exercise or as often as told by your health care provider. Use the heat source that your health care provider recommends, such as a moist heat pack or a heating pad.  Place a towel between your skin and the heat source.  Leave the heat on for 20-30 minutes.  Remove the heat if your skin turns bright red. This is especially important if you are unable to feel pain, heat, or cold. You may have a greater risk of  getting burned. Activity   Return to your normal activities as told by your health care provider. Ask your health care provider what activities are safe for you.  Avoid activities that make your symptoms worse.  Take brief periods of rest throughout the day. Resting in a lying or standing position is usually better than sitting to rest.  When you rest for longer periods, mix in some mild activity or stretching between periods of rest. This will help to prevent stiffness and pain.  Avoid sitting for long periods of time without moving. Get up and move around at least one time each hour.  Exercise and stretch regularly, as told by your health care provider.  Do not lift anything that is heavier than 10 lb (4.5 kg) while you have symptoms of sciatica. When you do not have symptoms, you should still avoid heavy lifting, especially repetitive heavy lifting.  When you lift objects, always use proper lifting technique, which includes:  Bending your knees.  Keeping the load close to your body.  Avoiding twisting. General instructions   Use good posture.  Avoid leaning forward while sitting.  Avoid hunching over while standing.  Maintain a healthy weight. Excess weight puts extra stress on your back and makes it difficult to maintain good posture.  Wear supportive, comfortable shoes. Avoid wearing high heels.  Avoid sleeping on a mattress that is too soft or too hard. A mattress that is firm enough to support your back when you sleep may help to reduce your pain.  Keep all follow-up visits as told by your health care provider. This is important. Contact a health care provider if:  You have pain that wakes you up when you are sleeping.  You have pain that gets worse when you lie down.  Your pain is worse than you have experienced in the past.  Your pain lasts longer than 4 weeks.  You experience unexplained weight loss. Get help right away if:  You lose control of your bowel  or bladder (incontinence).  You have:  Weakness in your lower back, pelvis, buttocks, or legs that gets worse.  Redness or swelling of your back.  A burning sensation when you urinate. This information is not intended to replace advice given to you by your health care provider. Make sure you discuss any questions you have with your health care provider. Document Released: 03/05/2001 Document Revised: 08/15/2015 Document Reviewed: 11/18/2014 [employer] Interactive Patient Education  2017 [employer]  

## 2016-07-19 NOTE — Progress Notes (Signed)
Subjective:     Patient ID: Darin [name], male   DOB: [birth year], 83 y.o.   MRN: [id number]  HPI Patient seen with recurrent pain left lower extremity from his low back. He had similar pains in the past. He had MRI scan back in 2011 which showed disc perfusions with foraminal stenosis L4-L5. Current pains go to the left lower extremity down to the ankle. He denies any weakness. Occasional numbness left anterior thigh. He describes achy pain moderate severity 24/7 and relatively constant. No relief with meloxicam. No loss of urine or stool control. Currently in physical therapy. Previous improvement with prednisone  Past Medical History:  Diagnosis Date  . Arthritis   . GERD (gastroesophageal reflux disease)   . H/O hiatal hernia    "FOR YEARS" " NO PROBLEM"  . Hyperlipidemia   . Hypertension   . Knee pain   . Neuropathy    PERIPHERAL   . Osteoporosis   . PMR (polymyalgia rheumatica) (HCC)   . Polymyalgia ([place]) 5 YEARS   Past Surgical History:  Procedure Laterality Date  . CARPAL TUNNEL RELEASE    . CATARACT EXTRACTION     right  . INGUINAL HERNIA REPAIR  10/14/2011   Procedure: HERNIA REPAIR INGUINAL ADULT;  Surgeon: [name], MD;  Location: [facility];  Service: General;  Laterality: Right;  Right Inguinal Hernia Repair with Mesh  . TONSILLECTOMY    . VARICOSE VEIN SURGERY      reports that he has never smoked. He has never used smokeless tobacco. He reports that he drinks alcohol. He reports that he does not use drugs. family history includes Heart disease in his father. Allergies  Allergen Reactions  . Statins Other (See Comments)    Muscle pain and upset stomach     Review of Systems  Constitutional: Negative for activity change, appetite change and fever.  Respiratory: Negative for cough and shortness of breath.   Cardiovascular: Negative for chest pain and leg swelling.  Gastrointestinal: Negative for abdominal pain and vomiting.  Genitourinary: Negative for  dysuria, flank pain and hematuria.  Musculoskeletal: Positive for back pain. Negative for joint swelling.  Neurological: Negative for weakness and numbness.       Objective:   Physical Exam  Constitutional: He is oriented to person, place, and time. He appears well-developed and well-nourished. No distress.  Neck: No thyromegaly present.  Cardiovascular: Normal rate, regular rhythm and normal heart sounds.   No murmur heard. Pulmonary/Chest: Effort normal and breath sounds normal. No respiratory distress. He has no wheezes. He has no rales.  Musculoskeletal: He exhibits no edema.  SLRs negative.  Neurological: He is alert and oriented to person, place, and time. He has normal reflexes. No cranial nerve deficit.  strength full lower extremities.  Skin: No rash noted.       Assessment:     Low back pain with left lumbar radiculitis symptoms    Plan:     -Trial of prednisone taper and reviewed potential side effects -Touch base in one week if not improved with prednisone -Avoid concomitant use of meloxicam on prednisone -Consider MRI in 1-2 weeks if not improved with the above  [name] MD [facility]

## 2016-07-19 NOTE — Progress Notes (Signed)
Pre visit review using our clinic review tool, if applicable. No additional management support is needed unless otherwise documented below in the visit note. 

## 2016-07-23 ENCOUNTER — Ambulatory Visit: Payer: Medicare Other | Attending: Family Medicine | Admitting: Physical Therapy

## 2016-07-23 DIAGNOSIS — M6281 Muscle weakness (generalized): Secondary | ICD-10-CM | POA: Diagnosis not present

## 2016-07-23 DIAGNOSIS — R296 Repeated falls: Secondary | ICD-10-CM | POA: Insufficient documentation

## 2016-07-23 DIAGNOSIS — M25552 Pain in left hip: Secondary | ICD-10-CM | POA: Insufficient documentation

## 2016-07-23 DIAGNOSIS — R262 Difficulty in walking, not elsewhere classified: Secondary | ICD-10-CM | POA: Insufficient documentation

## 2016-07-23 DIAGNOSIS — R279 Unspecified lack of coordination: Secondary | ICD-10-CM | POA: Diagnosis not present

## 2016-07-23 NOTE — Therapy (Signed)
[facility] [address], [place], [place], [address] Phone: [phone]   Fax:  [phone]  Physical Therapy Treatment  Patient Details  Name: Darin [name] MRN: [id number] Date of Birth: 1931/10/22 Referring Provider: Dr. Elease Hashimoto  Encounter Date: 07/23/2016      PT End of Session - 07/23/16 0831    Visit Number 12   Number of Visits 20   Date for PT Re-Evaluation 2016/09/01   Authorization Type G codes visit 10;  KX now (previous PT this year)   PT Start Time 0756   PT Stop Time 0843   PT Time Calculation (min) 47 min   Activity Tolerance Patient tolerated treatment well      Past Medical History:  Diagnosis Date  . Arthritis   . GERD (gastroesophageal reflux disease)   . H/O hiatal hernia    "FOR YEARS" " NO PROBLEM"  . Hyperlipidemia   . Hypertension   . Knee pain   . Neuropathy    PERIPHERAL   . Osteoporosis   . PMR (polymyalgia rheumatica) (HCC)   . Polymyalgia ([place]) 5 YEARS    Past Surgical History:  Procedure Laterality Date  . CARPAL TUNNEL RELEASE    . CATARACT EXTRACTION     right  . INGUINAL HERNIA REPAIR  10/14/2011   Procedure: HERNIA REPAIR INGUINAL ADULT;  Surgeon: [name], MD;  Location: [facility];  Service: General;  Laterality: Right;  Right Inguinal Hernia Repair with Mesh  . TONSILLECTOMY    . VARICOSE VEIN SURGERY      There were no vitals filed for this visit.      Subjective Assessment - 07/23/16 0804    Subjective The patient reports his leg pain is better now that he is on a prednisone taper.     Pertinent History chronic lumbar DDD, polyneuropathy;  HTN   Currently in Pain? Yes   Pain Score 6    Pain Location Leg   Pain Orientation Right;Left   Pain Type Chronic pain   Aggravating Factors  night time and mornings   Pain Relieving Factors prednisone                         [facility] Adult PT Treatment/Exercise - 07/23/16 0001      Therapeutic Activites    ADL's sit to stand, walking, standing, up and down steps     Neuro Re-ed    Neuro Re-ed Details  single limb and narrow base of support mild balance perturbances     Knee/Hip Exercises: Stretches   Active Hamstring Stretch Right;Left;5 reps   Active Hamstring Stretch Limitations on step    Other Knee/Hip Stretches stair hip flexor stretch 5x right/left   Other Knee/Hip Stretches calf stretch on step 3x right/left     Knee/Hip Exercises: Aerobic   Nustep L3 10 min seat 11  Therapist present to discuss treatment and progress     Knee/Hip Exercises: Standing   Hip ADduction Strengthening;Right;Left;15 reps   Hip Extension Stengthening;Right;Left;15 reps  red band   Extension Limitations red band   Forward Step Up Right;Left;1 set;15 reps;Hand Hold: 2;Step Height: 6"   Other Standing Knee Exercises 4 ways  15x right/left      Knee/Hip Exercises: Seated   Clamshell with TheraBand Blue  30x   Sit to [employer] 20 reps  From [employer]  PT Short Term Goals - 07/23/16 0838      PT SHORT TERM GOAL #1   Title be independent in initial HEP  07/09/16   Status Achieved     PT SHORT TERM GOAL #2   Title Activities-Specific Balance Confidence (ABC) Scale improved to 74% indicating improved confidence in his steadiness with functional activities at home and in the community   Status Achieved     PT SHORT TERM GOAL #3   Title The patient will have improved hip flexor muscle length to 5 degrees and HS length to 55 degrees needed for improved stride length with ambulation   Status Achieved     PT SHORT TERM GOAL #4   Title BERG balance test improved  to 38/56 indicating improved safety for standing ADLs like grooming and dresssing   Time 4   Period Weeks   Status On-going     PT SHORT TERM GOAL #5   Title Dynamic Gait Index improved to 14 indicating improved balance with dynamic functional activities like walking and climbing stairs   Status  Achieved           PT Long Term Goals - 07/23/16 4854      PT LONG TERM GOAL #1   Title be independent in advanced HEP for further improvements in strength, ROM and balance  08/06/16   Time 8   Period Weeks   Status On-going     PT LONG TERM GOAL #2   Title Activities Specific Balance Confidence Scale improved to 79% self confidence in his steadiness with functional activities   Status Achieved     PT LONG TERM GOAL #3   Title Dynamic Gait Index improved to 16 indicating improved balance with functional activities like stairs    Period Weeks   Status On-going     PT LONG TERM GOAL #4   Title Improved hip and knee strength to at least 4-/5 for greater ease with rising from a chair and  with standing > 10 min    Time 8   Period Weeks   Status On-going     PT LONG TERM GOAL #5   Title The patient will be able to ambulate 800 feet in six minutes needed for shorter distance community ambulation for traveling   Time 8   Period Weeks   Status On-going     PT LONG TERM GOAL #6   Title BERG balance score improved to 40/56 indicating improved safety with walking and standing dynamic activities   Time 8   Period Weeks   Status On-going               Plan - 07/23/16 6270    Clinical Impression Statement Patient has improved standing tolerance and able to perform ex's with increased resistance perhaps as result of prednisone.  Verbal cues to decreased UE support to challenge balance.   He needs a sitting rest break after standing ex's secondary to dizziness more than LE muscle fatigue.  Therapist closely monitoring response throughtout.     PT Treatment/Interventions ADLs/Self Care Home Management;Electrical Stimulation;Moist Heat;Therapeutic exercise;Therapeutic activities;Balance training;Neuromuscular re-education;Patient/family education;Taping;Manual techniques   PT Next Visit Plan  Nu-Step;  LE strengthening;  standing balance backwards weight shifting;  narrow base of  support, decreased UE support;   therapeutic activities;  KX every visit    Recommended Other Services cert signed      Patient will benefit from skilled therapeutic intervention in order to improve the following deficits and  impairments:  Decreased activity tolerance, Difficulty walking, Decreased strength, Pain, Decreased endurance, Abnormal gait, Decreased balance, Decreased coordination  Visit Diagnosis: Unspecified lack of coordination  Muscle weakness (generalized)  Repeated falls     Problem List Patient Active Problem List   Diagnosis Date Noted  . Leg edema, right 02/20/2015  . BPH (benign prostatic hypertrophy) 12/09/2014  . Chronic radicular low back pain 08/06/2012  . COPD (chronic obstructive pulmonary disease) ([place]) 04/21/2012  . Hyponatremia 03/29/2012  . Localized osteoarthrosis not specified whether primary or secondary, lower leg 06/20/2010  . VARICOSE VEINS LOWER EXTREMITIES W/INFLAMMATION 02/12/2010  . [place] LOCALIZED OSTEOARTHROSIS INVOLVING HAND 10/16/2009  . DUPUYTREN'S CONTRACTURE, RIGHT 07/04/2009  . DEGENERATIVE DISC DISEASE, LUMBOSACRAL SPINE W/RADICULOPATHY 06/19/2009  . CARCINOMA, BASAL CELL 02/07/2009  . ANEMIA OF CHRONIC DISEASE 02/07/2009  . BURSITIS, RIGHT SHOULDER 10/07/2008  . CATARACT ASSOCIATED WITH OTHER SYNDROMES 08/01/2008  . DRY SKIN 08/01/2008  . CERUMEN IMPACTION, BILATERAL 03/01/2008  . CRAMP IN LIMB 01/18/2008  . DEPRESSION, SITUATIONAL, ACUTE 12/28/2007  . CONSTIPATION, DRUG INDUCED 12/28/2007  . POLYNEUROPATHY OTHER DISEASES CLASSIFIED ELSW 09/24/2007  . WEIGHT LOSS, ABNORMAL 01/07/2007  . Hyperlipidemia 09/22/2006  . Essential hypertension 09/22/2006  . GERD 09/22/2006  . OSTEOARTHRITIS 09/22/2006   Ruben Im, PT 07/23/16 8:47 AM Phone: [phone] Fax: [phone]  Alvera Singh 07/23/2016, 8:44 AM  [facility] [address] [place], [place],  [address] Phone: [phone]   Fax:  [phone]  Name: Giang Hemme MRN: [id number] Date of Birth: September 20, 1931

## 2016-07-26 ENCOUNTER — Encounter: Payer: Self-pay | Admitting: Physical Therapy

## 2016-07-26 ENCOUNTER — Ambulatory Visit: Payer: Medicare Other | Admitting: Physical Therapy

## 2016-07-26 DIAGNOSIS — R296 Repeated falls: Secondary | ICD-10-CM | POA: Diagnosis not present

## 2016-07-26 DIAGNOSIS — R279 Unspecified lack of coordination: Secondary | ICD-10-CM | POA: Diagnosis not present

## 2016-07-26 DIAGNOSIS — M25552 Pain in left hip: Secondary | ICD-10-CM

## 2016-07-26 DIAGNOSIS — R262 Difficulty in walking, not elsewhere classified: Secondary | ICD-10-CM

## 2016-07-26 DIAGNOSIS — M6281 Muscle weakness (generalized): Secondary | ICD-10-CM | POA: Diagnosis not present

## 2016-07-26 NOTE — Therapy (Signed)
[facility] [address], [place], [place], [address] Phone: [phone]   Fax:  [phone]  Physical Therapy Treatment  Patient Details  Name: Darin [name] MRN: [id number] Date of Birth: November 12, 1931 Referring Provider: Dr. Elease Hashimoto  Encounter Date: 07/26/2016      PT End of Session - 07/26/16 0805    Visit Number 13   Number of Visits 20   Date for PT Re-Evaluation 16-Aug-2016   Authorization Type G codes visit 10;  KX now (previous PT this year)   PT Start Time 0802   PT Stop Time 0847   PT Time Calculation (min) 45 min   Activity Tolerance Patient tolerated treatment well   Behavior During Therapy [facility] for tasks assessed/performed      Past Medical History:  Diagnosis Date  . Arthritis   . GERD (gastroesophageal reflux disease)   . H/O hiatal hernia    "FOR YEARS" " NO PROBLEM"  . Hyperlipidemia   . Hypertension   . Knee pain   . Neuropathy    PERIPHERAL   . Osteoporosis   . PMR (polymyalgia rheumatica) (HCC)   . Polymyalgia ([place]) 5 YEARS    Past Surgical History:  Procedure Laterality Date  . CARPAL TUNNEL RELEASE    . CATARACT EXTRACTION     right  . INGUINAL HERNIA REPAIR  10/14/2011   Procedure: HERNIA REPAIR INGUINAL ADULT;  Surgeon: [name], MD;  Location: [facility];  Service: General;  Laterality: Right;  Right Inguinal Hernia Repair with Mesh  . TONSILLECTOMY    . VARICOSE VEIN SURGERY      There were no vitals filed for this visit.      Subjective Assessment - 07/26/16 0804    Subjective Patient reports feeling same as usual   Pertinent History chronic lumbar DDD, polyneuropathy;  HTN   Limitations Walking;House hold activities;Standing   How long can you sit comfortably? as long as  I want but trouble rising following    Diagnostic tests none recently   Patient Stated Goals walk normally;  maintain my balance   Currently in Pain? Yes   Pain Score 6    Pain Location Leg   Pain Orientation Right;Left   Pain Descriptors / Indicators Sore   Pain Type Chronic pain   Pain Onset More than a month ago   Pain Frequency Constant                         [facility] Adult PT Treatment/Exercise - 07/26/16 0001      Therapeutic Activites    ADL's sit to stand, walking, standing, up and down steps     Neuro Re-ed    Neuro Re-ed Details  single limb and narrow base of support mild balance perturbances     Knee/Hip Exercises: Stretches   Active Hamstring Stretch Right;Left;5 reps   Active Hamstring Stretch Limitations on step    Other Knee/Hip Stretches stair hip flexor stretch 5x right/left   Other Knee/Hip Stretches calf stretch on step 3x right/left     Knee/Hip Exercises: Aerobic   Nustep L3 10 min seat 11  Therapist present to discuss treatment and progress     Knee/Hip Exercises: Standing   Hip ADduction Strengthening;Right;Left;15 reps  red band   Hip Extension Stengthening;Right;Left;15 reps  red band   Extension Limitations red band   Forward Step Up Right;Left;1 set;15 reps;Hand Hold: 2;Step Height: 6"   Other Standing Knee  Exercises 4 ways  15x right/left      Knee/Hip Exercises: Seated   Clamshell with TheraBand Blue  30x   Sit to [employer] 20 reps  From [facility]                  PT Short Term Goals - 07/26/16 0805      PT SHORT TERM GOAL #4   Title BERG balance test improved  to 38/56 indicating improved safety for standing ADLs like grooming and dresssing   Baseline 33   Time 4   Period Weeks   Status On-going           PT Long Term Goals - 07/26/16 0805      PT LONG TERM GOAL #1   Title be independent in advanced HEP for further improvements in strength, ROM and balance  08/06/16   Time 8   Period Weeks   Status On-going     PT LONG TERM GOAL #3   Title Dynamic Gait Index improved to 16 indicating improved balance with functional activities like stairs    Time 8   Period Weeks   Status On-going     PT  LONG TERM GOAL #5   Title The patient will be able to ambulate 800 feet in six minutes needed for shorter distance community ambulation for traveling   Time 8   Period Weeks   Status On-going               Plan - 07/26/16 1610    Clinical Impression Statement Patient did well with all standing strenghtening exercises. Continues to have weakness in Bil LE and decreased tolerance for standing and walking. Patient will continue to benefit from skilled thearpy for LE strength and balance.    Rehab Potential Good   Clinical Impairments Affecting Rehab Potential high risk of falls   PT Frequency 2x / week   PT Duration 8 weeks   PT Treatment/Interventions ADLs/Self Care Home Management;Electrical Stimulation;Moist Heat;Therapeutic exercise;Therapeutic activities;Balance training;Neuromuscular re-education;Patient/family education;Taping;Manual techniques   PT Next Visit Plan  Nu-Step;  LE strengthening;  standing balance backwards weight shifting;  narrow base of support, decreased UE support;   therapeutic activities;  KX every visit    Consulted and Agree with Plan of Care Patient      Patient will benefit from skilled therapeutic intervention in order to improve the following deficits and impairments:  Decreased activity tolerance, Difficulty walking, Decreased strength, Pain, Decreased endurance, Abnormal gait, Decreased balance, Decreased coordination  Visit Diagnosis: Unspecified lack of coordination  Muscle weakness (generalized)  Repeated falls  Pain in left hip  Difficulty in walking, not elsewhere classified     Problem List Patient Active Problem List   Diagnosis Date Noted  . Leg edema, right 02/20/2015  . BPH (benign prostatic hypertrophy) 12/09/2014  . Chronic radicular low back pain 08/06/2012  . COPD (chronic obstructive pulmonary disease) ([place]) 04/21/2012  . Hyponatremia 03/29/2012  . Localized osteoarthrosis not specified whether primary or secondary,  lower leg 06/20/2010  . VARICOSE VEINS LOWER EXTREMITIES W/INFLAMMATION 02/12/2010  . [place] LOCALIZED OSTEOARTHROSIS INVOLVING HAND 10/16/2009  . DUPUYTREN'S CONTRACTURE, RIGHT 07/04/2009  . DEGENERATIVE DISC DISEASE, LUMBOSACRAL SPINE W/RADICULOPATHY 06/19/2009  . CARCINOMA, BASAL CELL 02/07/2009  . ANEMIA OF CHRONIC DISEASE 02/07/2009  . BURSITIS, RIGHT SHOULDER 10/07/2008  . CATARACT ASSOCIATED WITH OTHER SYNDROMES 08/01/2008  . DRY SKIN 08/01/2008  . CERUMEN IMPACTION, BILATERAL 03/01/2008  . CRAMP IN LIMB 01/18/2008  . DEPRESSION, SITUATIONAL, ACUTE 12/28/2007  .  CONSTIPATION, DRUG INDUCED 12/28/2007  . POLYNEUROPATHY OTHER DISEASES CLASSIFIED ELSW 09/24/2007  . WEIGHT LOSS, ABNORMAL 01/07/2007  . Hyperlipidemia 09/22/2006  . Essential hypertension 09/22/2006  . GERD 09/22/2006  . OSTEOARTHRITIS 09/22/2006    Mikle Bosworth PTA 07/26/2016, 9:15 AM  [facility] [address] [place], [place], [address] Phone: [phone]   Fax:  [phone]  Name: Paola Flynt MRN: [id number] Date of Birth: 03-22-1932

## 2016-07-30 ENCOUNTER — Ambulatory Visit: Payer: Medicare Other | Admitting: Physical Therapy

## 2016-07-30 ENCOUNTER — Encounter: Payer: Self-pay | Admitting: Physical Therapy

## 2016-07-30 DIAGNOSIS — M6281 Muscle weakness (generalized): Secondary | ICD-10-CM

## 2016-07-30 DIAGNOSIS — M25552 Pain in left hip: Secondary | ICD-10-CM

## 2016-07-30 DIAGNOSIS — R262 Difficulty in walking, not elsewhere classified: Secondary | ICD-10-CM

## 2016-07-30 DIAGNOSIS — R279 Unspecified lack of coordination: Secondary | ICD-10-CM

## 2016-07-30 DIAGNOSIS — R296 Repeated falls: Secondary | ICD-10-CM | POA: Diagnosis not present

## 2016-07-30 NOTE — Therapy (Signed)
[facility] [address], [place], [place], [address] Phone: [phone]   Fax:  [phone]  Physical Therapy Treatment  Patient Details  Name: Darin [name] MRN: [id number] Date of Birth: 05-06-1931 Referring Provider: Dr. Elease Hashimoto  Encounter Date: 07/30/2016      PT End of Session - 07/30/16 0801    Visit Number 14   Number of Visits 20   Date for PT Re-Evaluation 28-Aug-2016   Authorization Type G codes visit 10;  KX now (previous PT this year)   PT Start Time 0756   PT Stop Time 0844   PT Time Calculation (min) 48 min   Activity Tolerance Patient tolerated treatment well   Behavior During Therapy [facility] for tasks assessed/performed      Past Medical History:  Diagnosis Date  . Arthritis   . GERD (gastroesophageal reflux disease)   . H/O hiatal hernia    "FOR YEARS" " NO PROBLEM"  . Hyperlipidemia   . Hypertension   . Knee pain   . Neuropathy    PERIPHERAL   . Osteoporosis   . PMR (polymyalgia rheumatica) (HCC)   . Polymyalgia ([place]) 5 YEARS    Past Surgical History:  Procedure Laterality Date  . CARPAL TUNNEL RELEASE    . CATARACT EXTRACTION     right  . INGUINAL HERNIA REPAIR  10/14/2011   Procedure: HERNIA REPAIR INGUINAL ADULT;  Surgeon: [name], MD;  Location: [facility];  Service: General;  Laterality: Right;  Right Inguinal Hernia Repair with Mesh  . TONSILLECTOMY    . VARICOSE VEIN SURGERY      There were no vitals filed for this visit.      Subjective Assessment - 07/30/16 0758    Subjective "Pain is pretty reasonable today, it's about only a 5.5"   Pertinent History chronic lumbar DDD, polyneuropathy;  HTN   Limitations Walking;House hold activities;Standing   How long can you sit comfortably? as long as  I want but trouble rising following    Diagnostic tests none recently   Patient Stated Goals walk normally;  maintain my balance   Currently in Pain? Yes   Pain Score 5    Pain Location Leg   Pain Orientation Right;Left   Pain Descriptors / Indicators Sore   Pain Type Chronic pain   Pain Onset More than a month ago   Pain Frequency Constant   Aggravating Factors  night time and mornings   Pain Relieving Factors prednisone                         [facility] Adult PT Treatment/Exercise - 07/30/16 0001      Therapeutic Activites    ADL's sit to stand, walking, standing, up and down steps     Knee/Hip Exercises: Stretches   Active Hamstring Stretch Right;Left;5 reps   Active Hamstring Stretch Limitations on step    Other Knee/Hip Stretches stair hip flexor stretch 5x right/left   Other Knee/Hip Stretches calf stretch on step 3x right/left     Knee/Hip Exercises: Aerobic   Stationary Bike 6 min  Therapist present   Nustep L3 10 min seat 11  Therapist present to discuss treatment and progress     Knee/Hip Exercises: Standing   Hip ADduction Strengthening;Right;Left;15 reps  red band   Hip Extension Stengthening;Right;Left;15 reps  red band   Extension Limitations red band   Forward Step Up Right;Left;1 set;15 reps;Hand Hold: 2;Step Height: 6"  Other Standing Knee Exercises 4 ways  15x right/left      Knee/Hip Exercises: Seated   Clamshell with TheraBand Blue  30x   Sit to [employer] 20 reps  From BOSU                  PT Short Term Goals - 07/30/16 0824      PT SHORT TERM GOAL #4   Title BERG balance test improved  to 38/56 indicating improved safety for standing ADLs like grooming and dresssing   Baseline 33   Time 4   Period Weeks   Status On-going           PT Long Term Goals - 07/30/16 1275      PT LONG TERM GOAL #1   Title be independent in advanced HEP for further improvements in strength, ROM and balance  08/06/16   Time 8   Period Weeks   Status On-going     PT LONG TERM GOAL #3   Title Dynamic Gait Index improved to 16 indicating improved balance with functional activities like stairs    Time 8    Period Weeks   Status On-going     PT LONG TERM GOAL #4   Title Improved hip and knee strength to at least 4-/5 for greater ease with rising from a chair and  with standing > 10 min    Time 8   Period Weeks   Status On-going               Plan - 07/30/16 1700    Clinical Impression Statement Patient conitnues to progress with standing strengthening and balance. Patient continues to be fatigued after a few minutes of standing. Patient able to do 6 minutes on bicycle for hip ROM and endurance.  Patient will continue to benefit from skilled thearpy for LE strengthening and progression of balance.    Rehab Potential Good   Clinical Impairments Affecting Rehab Potential high risk of falls   PT Frequency 2x / week   PT Duration 8 weeks   PT Treatment/Interventions ADLs/Self Care Home Management;Electrical Stimulation;Moist Heat;Therapeutic exercise;Therapeutic activities;Balance training;Neuromuscular re-education;Patient/family education;Taping;Manual techniques   PT Next Visit Plan  Nu-Step;  LE strengthening;  standing balance backwards weight shifting;  narrow base of support, decreased UE support;   therapeutic activities;  KX every visit    Consulted and Agree with Plan of Care Patient      Patient will benefit from skilled therapeutic intervention in order to improve the following deficits and impairments:  Decreased activity tolerance, Difficulty walking, Decreased strength, Pain, Decreased endurance, Abnormal gait, Decreased balance, Decreased coordination  Visit Diagnosis: Unspecified lack of coordination  Muscle weakness (generalized)  Repeated falls  Pain in left hip  Difficulty in walking, not elsewhere classified     Problem List Patient Active Problem List   Diagnosis Date Noted  . Leg edema, right 02/20/2015  . BPH (benign prostatic hypertrophy) 12/09/2014  . Chronic radicular low back pain 08/06/2012  . COPD (chronic obstructive pulmonary disease) ([place])  04/21/2012  . Hyponatremia 03/29/2012  . Localized osteoarthrosis not specified whether primary or secondary, lower leg 06/20/2010  . VARICOSE VEINS LOWER EXTREMITIES W/INFLAMMATION 02/12/2010  . [place] LOCALIZED OSTEOARTHROSIS INVOLVING HAND 10/16/2009  . DUPUYTREN'S CONTRACTURE, RIGHT 07/04/2009  . DEGENERATIVE DISC DISEASE, LUMBOSACRAL SPINE W/RADICULOPATHY 06/19/2009  . CARCINOMA, BASAL CELL 02/07/2009  . ANEMIA OF CHRONIC DISEASE 02/07/2009  . BURSITIS, RIGHT SHOULDER 10/07/2008  . CATARACT ASSOCIATED WITH OTHER SYNDROMES 08/01/2008  .  DRY SKIN 08/01/2008  . CERUMEN IMPACTION, BILATERAL 03/01/2008  . CRAMP IN LIMB 01/18/2008  . DEPRESSION, SITUATIONAL, ACUTE 12/28/2007  . CONSTIPATION, DRUG INDUCED 12/28/2007  . POLYNEUROPATHY OTHER DISEASES CLASSIFIED ELSW 09/24/2007  . WEIGHT LOSS, ABNORMAL 01/07/2007  . Hyperlipidemia 09/22/2006  . Essential hypertension 09/22/2006  . GERD 09/22/2006  . OSTEOARTHRITIS 09/22/2006    Mikle Bosworth PTA 07/30/2016, 8:44 AM  [facility] [address], [place], [place], [address] Phone: [phone]   Fax:  [phone]  Name: Darin [name] MRN: [id number] Date of Birth: [birth year]

## 2016-08-02 ENCOUNTER — Ambulatory Visit: Payer: Medicare Other | Admitting: Physical Therapy

## 2016-08-02 DIAGNOSIS — M6281 Muscle weakness (generalized): Secondary | ICD-10-CM | POA: Diagnosis not present

## 2016-08-02 DIAGNOSIS — R296 Repeated falls: Secondary | ICD-10-CM

## 2016-08-02 DIAGNOSIS — R262 Difficulty in walking, not elsewhere classified: Secondary | ICD-10-CM | POA: Diagnosis not present

## 2016-08-02 DIAGNOSIS — R279 Unspecified lack of coordination: Secondary | ICD-10-CM | POA: Diagnosis not present

## 2016-08-02 DIAGNOSIS — M25552 Pain in left hip: Secondary | ICD-10-CM | POA: Diagnosis not present

## 2016-08-02 NOTE — Therapy (Signed)
[facility] [address] [place], [place], [address] Phone: [phone]   Fax:  [phone]  Physical Therapy Treatment/Recertification  Patient Details  Name: Darin [name] MRN: [id number] Date of Birth: February 20, 1932 Referring Provider: Dr. Elease Hashimoto  Encounter Date: 08/02/2016      PT End of Session - 08/02/16 0809    Visit Number 15   Number of Visits 20   Date for PT Re-Evaluation 2016-09-14   Authorization Type G codes visit 59;  KX now (previous PT this year)   PT Start Time 0800   PT Stop Time 0844   PT Time Calculation (min) 44 min   Activity Tolerance Patient tolerated treatment well      Past Medical History:  Diagnosis Date  . Arthritis   . GERD (gastroesophageal reflux disease)   . H/O hiatal hernia    "FOR YEARS" " NO PROBLEM"  . Hyperlipidemia   . Hypertension   . Knee pain   . Neuropathy    PERIPHERAL   . Osteoporosis   . PMR (polymyalgia rheumatica) (HCC)   . Polymyalgia ([place]) 5 YEARS    Past Surgical History:  Procedure Laterality Date  . CARPAL TUNNEL RELEASE    . CATARACT EXTRACTION     right  . INGUINAL HERNIA REPAIR  10/14/2011   Procedure: HERNIA REPAIR INGUINAL ADULT;  Surgeon: [name], MD;  Location: [facility];  Service: General;  Laterality: Right;  Right Inguinal Hernia Repair with Mesh  . TONSILLECTOMY    . VARICOSE VEIN SURGERY      There were no vitals filed for this visit.      Subjective Assessment - 08/02/16 0804    Subjective "I think the steroid helped."  My lower legs give me a fit.     Pertinent History trip to [facility] Sep 15, 2022   Currently in Pain? Yes   Pain Score 6    Pain Location Leg   Pain Orientation Right;Left;Lower            [facility] PT Assessment - 08/02/16 0001      Observation/Other Assessments   Activities of Balance Confidence Scale (ABC Scale)  79% balance confidence     Strength   Right Hip Flexion 4-/5   Right Hip Extension 4-/5    Right Hip ABduction 3+/5   Left Hip Flexion 4-/5   Left Hip Extension 4-/5   Left Hip ABduction 3+/5   Right Knee Flexion 4-/5   Right Knee Extension 4-/5   Left Knee Flexion 4-/5   Left Knee Extension 4-/5   Right Ankle Dorsiflexion 4-/5   Right Ankle Eversion 4-/5   Left Ankle Dorsiflexion 4-/5     6 minute walk test results    Aerobic Endurance Distance Walked 790     Berg Balance Test   Sit to Stand Able to stand  independently using hands   Standing Unsupported Able to stand safely 2 minutes   Sitting with Back Unsupported but Feet Supported on Floor or Stool Able to sit safely and securely 2 minutes   Stand to Sit Controls descent by using hands   Transfers Able to transfer safely, definite need of hands   Standing Unsupported with Eyes Closed Able to stand 10 seconds safely   Standing Ubsupported with Feet Together Able to place feet together independently and stand for 1 minute with supervision   From Standing, Reach Forward with Outstretched Arm Can reach forward >12 cm safely (5")   From  Standing Position, Pick up Object from Floor Able to pick up shoe, needs supervision   From Standing Position, Turn to Look Behind Over each Shoulder Looks behind one side only/other side shows less weight shift   Turn 360 Degrees Able to turn 360 degrees safely but slowly   Standing Unsupported, Alternately Place Feet on Step/Stool Able to complete 4 steps without aid or supervision   Standing Unsupported, One Foot in Front Able to take small step independently and hold 30 seconds   Standing on One Leg Tries to lift leg/unable to hold 3 seconds but remains standing independently   Total Score 40     Dynamic Gait Index   Level Surface Normal   Change in Gait Speed Normal   Gait with Horizontal Head Turns Mild Impairment   Gait with Vertical Head Turns Mild Impairment   Gait and Pivot Turn Mild Impairment   Step Over Obstacle Mild Impairment   Step Around Obstacles Normal   Steps  Moderate Impairment   Total Score 18     Timed Up and Go Test   Manual TUG (seconds) 15.6                     [facility] Adult PT Treatment/Exercise - 08/02/16 0001      Therapeutic Activites    ADL's sit to stand, walking, standing, up and down steps     Neuro Re-ed    Neuro Re-ed Details  dynamic standing balance     Knee/Hip Exercises: Aerobic   Nustep L3 10 min seat 11  Therapist present to discuss treatment and progress                  PT Short Term Goals - 08/02/16 0812      PT SHORT TERM GOAL #1   Title be independent in initial HEP  07/09/16   Status Achieved     PT SHORT TERM GOAL #2   Title Activities-Specific Balance Confidence (ABC) Scale improved to 74% indicating improved confidence in his steadiness with functional activities at home and in the community   Status Achieved     PT SHORT TERM GOAL #3   Title The patient will have improved hip flexor muscle length to 5 degrees and HS length to 55 degrees needed for improved stride length with ambulation   Status Achieved     PT SHORT TERM GOAL #4   Title BERG balance test improved  to 38/56 indicating improved safety for standing ADLs like grooming and dresssing   Time --   Period --   Status Achieved     PT SHORT TERM GOAL #5   Title Dynamic Gait Index improved to 14 indicating improved balance with dynamic functional activities like walking and climbing stairs   Status Achieved           PT Long Term Goals - 08/02/16 0839      PT LONG TERM GOAL #1   Title be independent in advanced HEP for further improvements in strength, ROM and balance  08/06/16   Time 6   Period Weeks   Status On-going     PT LONG TERM GOAL #2   Title Activities Specific Balance Confidence Scale improved to 82% self confidence in his steadiness with functional activities   Time 6   Period Weeks   Status Revised     PT LONG TERM GOAL #3   Title Dynamic Gait Index improved to 17 indicating improved  balance  with functional activities like stairs    Time 6   Period Weeks   Status Revised     PT LONG TERM GOAL #4   Title Improved hip and knee strength to at least 4/5 for greater ease with rising from a chair and  with standing > 10 min    Time 6   Period Weeks   Status Revised     PT LONG TERM GOAL #5   Title The patient will be able to ambulate 800 feet in six minutes needed for shorter distance community ambulation for traveling   Time 6   Period Weeks   Status On-going     PT LONG TERM GOAL #6   Title BERG balance score improved to 42/56 indicating improved safety with walking and standing dynamic activities   Time 6   Period Weeks   Status Revised               Plan - 08/02/16 6073    Clinical Impression Statement The patient has improved with BERG balance test  to 40/56 but continues to have difficulty with narrow base of support and single leg challenges.  He continues to be at moderate risk for falls.   Dynamic Gait Index score has also improved to 18 as patient is able to maintain his speed with maneuvering around obstacles although head movements are difficult secondary to dizziness.  Improvement in LE strength however proximal hip weakness persists and contributes to his wide based waddling gait.  Six minute walk improved to 790 feet without assistive device.  The patient plans to take a trip to [place] at the end of June.  He would benefit from continued PT for further gains in balance and strength to decrease his risk for falls.     Rehab Potential Good   Clinical Impairments Affecting Rehab Potential high risk of falls   PT Frequency 2x / week   PT Duration 6 weeks   PT Treatment/Interventions ADLs/Self Care Home Management;Electrical Stimulation;Moist Heat;Therapeutic exercise;Therapeutic activities;Balance training;Neuromuscular re-education;Patient/family education;Taping;Manual techniques   PT Next Visit Plan  Nu-Step;  LE strengthening;  standing balance  backwards weight shifting;  narrow base of support, decreased UE support;   therapeutic activities;  KX every visit       Patient will benefit from skilled therapeutic intervention in order to improve the following deficits and impairments:  Decreased activity tolerance, Difficulty walking, Decreased strength, Pain, Decreased endurance, Abnormal gait, Decreased balance, Decreased coordination  Visit Diagnosis: Unspecified lack of coordination - Plan: PT plan of care cert/re-cert  Muscle weakness (generalized) - Plan: PT plan of care cert/re-cert  Repeated falls - Plan: PT plan of care cert/re-cert     Problem List Patient Active Problem List   Diagnosis Date Noted  . Leg edema, right 02/20/2015  . BPH (benign prostatic hypertrophy) 12/09/2014  . Chronic radicular low back pain 08/06/2012  . COPD (chronic obstructive pulmonary disease) ([place]) 04/21/2012  . Hyponatremia 03/29/2012  . Localized osteoarthrosis not specified whether primary or secondary, lower leg 06/20/2010  . VARICOSE VEINS LOWER EXTREMITIES W/INFLAMMATION 02/12/2010  . [place] LOCALIZED OSTEOARTHROSIS INVOLVING HAND 10/16/2009  . DUPUYTREN'S CONTRACTURE, RIGHT 07/04/2009  . DEGENERATIVE DISC DISEASE, LUMBOSACRAL SPINE W/RADICULOPATHY 06/19/2009  . CARCINOMA, BASAL CELL 02/07/2009  . ANEMIA OF CHRONIC DISEASE 02/07/2009  . BURSITIS, RIGHT SHOULDER 10/07/2008  . CATARACT ASSOCIATED WITH OTHER SYNDROMES 08/01/2008  . DRY SKIN 08/01/2008  . CERUMEN IMPACTION, BILATERAL 03/01/2008  . CRAMP IN LIMB 01/18/2008  . DEPRESSION, SITUATIONAL,  ACUTE 12/28/2007  . CONSTIPATION, DRUG INDUCED 12/28/2007  . POLYNEUROPATHY OTHER DISEASES CLASSIFIED ELSW 09/24/2007  . WEIGHT LOSS, ABNORMAL 01/07/2007  . Hyperlipidemia 09/22/2006  . Essential hypertension 09/22/2006  . GERD 09/22/2006  . OSTEOARTHRITIS 09/22/2006   Ruben Im, PT 08/02/16 10:02 AM Phone: [phone] Fax: [phone]  Alvera Singh 08/02/2016, 10:01  AM  [facility] [address] [place], [place], [address] Phone: [phone]   Fax:  [phone]  Name: Darin [name] MRN: [id number] Date of Birth: 12-Apr-1931

## 2016-08-06 ENCOUNTER — Encounter: Payer: Self-pay | Admitting: Physical Therapy

## 2016-08-06 ENCOUNTER — Ambulatory Visit: Payer: Medicare Other | Admitting: Physical Therapy

## 2016-08-06 DIAGNOSIS — R279 Unspecified lack of coordination: Secondary | ICD-10-CM

## 2016-08-06 DIAGNOSIS — R296 Repeated falls: Secondary | ICD-10-CM | POA: Diagnosis not present

## 2016-08-06 DIAGNOSIS — M25552 Pain in left hip: Secondary | ICD-10-CM | POA: Diagnosis not present

## 2016-08-06 DIAGNOSIS — R262 Difficulty in walking, not elsewhere classified: Secondary | ICD-10-CM | POA: Diagnosis not present

## 2016-08-06 DIAGNOSIS — M6281 Muscle weakness (generalized): Secondary | ICD-10-CM

## 2016-08-06 NOTE — Therapy (Signed)
[facility] [address] [place], [place], [address] Phone: [phone]   Fax:  [phone]  Physical Therapy Treatment  Patient Details  Name: Darin [name] MRN: [id number] Date of Birth: 1931/05/24 Referring Provider: Dr. Elease Hashimoto  Encounter Date: 08/06/2016      PT End of Session - 08/06/16 0804    Visit Number 16   Number of Visits 20   Date for PT Re-Evaluation 12-Oct-2016   Authorization Type G codes visit 19;  KX now (previous PT this year)   PT Start Time 0758   PT Stop Time 0839   PT Time Calculation (min) 41 min   Activity Tolerance Patient tolerated treatment well   Behavior During Therapy [facility] for tasks assessed/performed      Past Medical History:  Diagnosis Date  . Arthritis   . GERD (gastroesophageal reflux disease)   . H/O hiatal hernia    "FOR YEARS" " NO PROBLEM"  . Hyperlipidemia   . Hypertension   . Knee pain   . Neuropathy    PERIPHERAL   . Osteoporosis   . PMR (polymyalgia rheumatica) (HCC)   . Polymyalgia ([place]) 5 YEARS    Past Surgical History:  Procedure Laterality Date  . CARPAL TUNNEL RELEASE    . CATARACT EXTRACTION     right  . INGUINAL HERNIA REPAIR  10/14/2011   Procedure: HERNIA REPAIR INGUINAL ADULT;  Surgeon: [name], MD;  Location: [facility];  Service: General;  Laterality: Right;  Right Inguinal Hernia Repair with Mesh  . TONSILLECTOMY    . VARICOSE VEIN SURGERY      There were no vitals filed for this visit.      Subjective Assessment - 08/06/16 0801    Subjective Legs are about the same   Pertinent History trip to [place] 2022/10/13   Limitations Walking;House hold activities;Standing   How long can you sit comfortably? as long as  I want but trouble rising following    Diagnostic tests none recently   Patient Stated Goals walk normally;  maintain my balance   Currently in Pain? Yes   Pain Score 6    Pain Location Leg   Pain Orientation Right;Left;Lower   Pain Descriptors / Indicators Sore   Pain Type Chronic pain   Pain Onset More than a month ago   Pain Frequency Constant                         [facility] Adult PT Treatment/Exercise - 08/06/16 0001      Therapeutic Activites    ADL's sit to stand, walking, standing, up and down steps     Neuro Re-ed    Neuro Re-ed Details  dynamic standing balance     Knee/Hip Exercises: Aerobic   Nustep L3 10 min seat 11  Therapist present to discuss treatment and progress     Knee/Hip Exercises: Standing   Forward Step Up Right;Left;1 set;15 reps;Hand Hold: 2;Step Height: 6"  And Toe taps x20   Rebounder 3 direction weight shifting, marching  x1 minute each   Other Standing Knee Exercises 4 ways  15x right/left   with red band     Knee/Hip Exercises: Seated   Sit to [employer] 20 reps  From [employer]                  PT Short Term Goals - 08/02/16 0812      PT SHORT TERM GOAL #1  Title be independent in initial HEP  07/09/16   Status Achieved     PT SHORT TERM GOAL #2   Title Activities-Specific Balance Confidence (ABC) Scale improved to 74% indicating improved confidence in his steadiness with functional activities at home and in the community   Status Achieved     PT SHORT TERM GOAL #3   Title The patient will have improved hip flexor muscle length to 5 degrees and HS length to 55 degrees needed for improved stride length with ambulation   Status Achieved     PT SHORT TERM GOAL #4   Title BERG balance test improved  to 38/56 indicating improved safety for standing ADLs like grooming and dresssing   Time --   Period --   Status Achieved     PT SHORT TERM GOAL #5   Title Dynamic Gait Index improved to 14 indicating improved balance with dynamic functional activities like walking and climbing stairs   Status Achieved           PT Long Term Goals - 08/06/16 0805      PT LONG TERM GOAL #1   Title be independent in advanced HEP for further improvements in  strength, ROM and balance  08/06/16   Time 6   Period Weeks   Status On-going     PT LONG TERM GOAL #2   Title Activities Specific Balance Confidence Scale improved to 82% self confidence in his steadiness with functional activities   Time 6   Period Weeks   Status On-going     PT LONG TERM GOAL #3   Title Dynamic Gait Index improved to 17 indicating improved balance with functional activities like stairs    Time 6   Period Weeks   Status On-going     PT LONG TERM GOAL #4   Title Improved hip and knee strength to at least 4/5 for greater ease with rising from a chair and  with standing > 10 min    Time 6   Period Weeks   Status On-going     PT LONG TERM GOAL #5   Title The patient will be able to ambulate 800 feet in six minutes needed for shorter distance community ambulation for traveling   Baseline 480   Time 6   Period Weeks   Status On-going               Plan - 08/06/16 1610    Clinical Impression Statement Patient with continued leg pain and weakness. Able to tolerate exercises well with improving endurance. Patient will continue to benefit from skilled thearpy for LE strength and stability.    Rehab Potential Good   Clinical Impairments Affecting Rehab Potential high risk of falls   PT Frequency 2x / week   PT Duration 6 weeks   PT Treatment/Interventions ADLs/Self Care Home Management;Electrical Stimulation;Moist Heat;Therapeutic exercise;Therapeutic activities;Balance training;Neuromuscular re-education;Patient/family education;Taping;Manual techniques   PT Next Visit Plan  Nu-Step;  LE strengthening;  standing balance backwards weight shifting;  narrow base of support, decreased UE support;   therapeutic activities;  KX every visit    Consulted and Agree with Plan of Care Patient      Patient will benefit from skilled therapeutic intervention in order to improve the following deficits and impairments:  Decreased activity tolerance, Difficulty walking,  Decreased strength, Pain, Decreased endurance, Abnormal gait, Decreased balance, Decreased coordination  Visit Diagnosis: Unspecified lack of coordination  Muscle weakness (generalized)  Repeated falls  Pain in left  hip  Difficulty in walking, not elsewhere classified     Problem List Patient Active Problem List   Diagnosis Date Noted  . Leg edema, right 02/20/2015  . BPH (benign prostatic hypertrophy) 12/09/2014  . Chronic radicular low back pain 08/06/2012  . COPD (chronic obstructive pulmonary disease) ([place]) 04/21/2012  . Hyponatremia 03/29/2012  . Localized osteoarthrosis not specified whether primary or secondary, lower leg 06/20/2010  . VARICOSE VEINS LOWER EXTREMITIES W/INFLAMMATION 02/12/2010  . [place] LOCALIZED OSTEOARTHROSIS INVOLVING HAND 10/16/2009  . DUPUYTREN'S CONTRACTURE, RIGHT 07/04/2009  . DEGENERATIVE DISC DISEASE, LUMBOSACRAL SPINE W/RADICULOPATHY 06/19/2009  . CARCINOMA, BASAL CELL 02/07/2009  . ANEMIA OF CHRONIC DISEASE 02/07/2009  . BURSITIS, RIGHT SHOULDER 10/07/2008  . CATARACT ASSOCIATED WITH OTHER SYNDROMES 08/01/2008  . DRY SKIN 08/01/2008  . CERUMEN IMPACTION, BILATERAL 03/01/2008  . CRAMP IN LIMB 01/18/2008  . DEPRESSION, SITUATIONAL, ACUTE 12/28/2007  . CONSTIPATION, DRUG INDUCED 12/28/2007  . POLYNEUROPATHY OTHER DISEASES CLASSIFIED ELSW 09/24/2007  . WEIGHT LOSS, ABNORMAL 01/07/2007  . Hyperlipidemia 09/22/2006  . Essential hypertension 09/22/2006  . GERD 09/22/2006  . OSTEOARTHRITIS 09/22/2006    Mikle Bosworth PTA 08/06/2016, 8:58 AM  [facility] [address], [place], [place], [address] Phone: [phone]   Fax:  [phone]  Name: Darin [name] MRN: [id number] Date of Birth: 08/11/1931

## 2016-08-09 ENCOUNTER — Ambulatory Visit: Payer: Medicare Other | Admitting: Physical Therapy

## 2016-08-09 DIAGNOSIS — R296 Repeated falls: Secondary | ICD-10-CM

## 2016-08-09 DIAGNOSIS — M6281 Muscle weakness (generalized): Secondary | ICD-10-CM

## 2016-08-09 DIAGNOSIS — R262 Difficulty in walking, not elsewhere classified: Secondary | ICD-10-CM | POA: Diagnosis not present

## 2016-08-09 DIAGNOSIS — R279 Unspecified lack of coordination: Secondary | ICD-10-CM | POA: Diagnosis not present

## 2016-08-09 DIAGNOSIS — M25552 Pain in left hip: Secondary | ICD-10-CM | POA: Diagnosis not present

## 2016-08-09 NOTE — Therapy (Signed)
[facility] [address], [place], [place], [address] Phone: [phone]   Fax:  [phone]  Physical Therapy Treatment  Patient Details  Name: Darin [name] MRN: [id number] Date of Birth: 1931-10-23 Referring Provider: Dr. Elease Hashimoto  Encounter Date: 08/09/2016      PT End of Session - 08/09/16 0757    Visit Number 17   Number of Visits 20   Date for PT Re-Evaluation 09-26-16   Authorization Type G codes visit 20;  KX now (previous PT this year)   PT Start Time 0751   PT Stop Time 0849   PT Time Calculation (min) 58 min   Activity Tolerance Patient tolerated treatment well      Past Medical History:  Diagnosis Date  . Arthritis   . GERD (gastroesophageal reflux disease)   . H/O hiatal hernia    "FOR YEARS" " NO PROBLEM"  . Hyperlipidemia   . Hypertension   . Knee pain   . Neuropathy    PERIPHERAL   . Osteoporosis   . PMR (polymyalgia rheumatica) (HCC)   . Polymyalgia ([place]) 5 YEARS    Past Surgical History:  Procedure Laterality Date  . CARPAL TUNNEL RELEASE    . CATARACT EXTRACTION     right  . INGUINAL HERNIA REPAIR  10/14/2011   Procedure: HERNIA REPAIR INGUINAL ADULT;  Surgeon: [name], MD;  Location: [facility];  Service: General;  Laterality: Right;  Right Inguinal Hernia Repair with Mesh  . TONSILLECTOMY    . VARICOSE VEIN SURGERY      There were no vitals filed for this visit.      Subjective Assessment - 08/09/16 0751    Subjective Patient states he feels the effects of the steroid is "long gone."  My legs are about like usual, no better/no worse.  Patient states he wants to work on his speed and endurance with walking.    Pertinent History trip to [place] 09-27-2022   Patient Stated Goals walk normally;  maintain my balance   Currently in Pain? Yes   Pain Score 6    Pain Location Leg   Pain Orientation Right;Left;Lower   Pain Type Chronic pain   Aggravating Factors  night time and  mornings   Pain Relieving Factors nothing                         [facility] Adult PT Treatment/Exercise - 08/09/16 0001      Ambulation/Gait   Gait Comments Metronome 95, 97 and 100 beats per min 2 laps each     Therapeutic Activites    ADL's sit to stand, walking, standing, up and down steps     Neuro Re-ed    Neuro Re-ed Details  dynamic standing balance, narrow base of support, speed of movement     Lumbar Exercises: Seated   Other Seated Lumbar Exercises vertical foam roll push downs for abdominal activation     Knee/Hip Exercises: Stretches   Active Hamstring Stretch Right;Left;5 reps   Active Hamstring Stretch Limitations on step    Other Knee/Hip Stretches stair hip flexor stretch 5x right/left   Other Knee/Hip Stretches calf stretch on step 3x right/left     Knee/Hip Exercises: Aerobic   Stationary Bike 6 min  Therapist present   Nustep L3 10 min seat 11  Therapist present to discuss treatment and progress     Knee/Hip Exercises: Standing   Other Standing Knee Exercises floor  ladder forward, side stepping , in/outs 2 laps each     Knee/Hip Exercises: Seated   Long Arc Quad Strengthening;Right;Left;20 reps   Long Arc Quad Limitations green band   Clamshell with TheraBand Blue  30x   Marching Strengthening;Right;Left;10 reps   Sit to [employer] 20 reps  From [facility]     Intermittent rest breaks between exs secondary to dizziness.               PT Short Term Goals - 08/09/16 0758      PT SHORT TERM GOAL #1   Title be independent in initial HEP  07/09/16   Status Achieved     PT SHORT TERM GOAL #2   Title Activities-Specific Balance Confidence (ABC) Scale improved to 74% indicating improved confidence in his steadiness with functional activities at home and in the community   Status Achieved     PT SHORT TERM GOAL #3   Title The patient will have improved hip flexor muscle length to 5 degrees and HS length to 55 degrees needed for improved  stride length with ambulation   Status Achieved     PT SHORT TERM GOAL #4   Title BERG balance test improved  to 38/56 indicating improved safety for standing ADLs like grooming and dresssing   Status Achieved     PT SHORT TERM GOAL #5   Title Dynamic Gait Index improved to 14 indicating improved balance with dynamic functional activities like walking and climbing stairs   Status Achieved           PT Long Term Goals - 08/09/16 0758      PT LONG TERM GOAL #1   Title be independent in advanced HEP for further improvements in strength, ROM and balance  08/06/16   Time 6   Period Weeks   Status On-going     PT LONG TERM GOAL #2   Title Activities Specific Balance Confidence Scale improved to 82% self confidence in his steadiness with functional activities   Time 6   Period Weeks   Status On-going     PT LONG TERM GOAL #3   Title Dynamic Gait Index improved to 17 indicating improved balance with functional activities like stairs    Time 6   Period Weeks   Status On-going     PT LONG TERM GOAL #4   Title Improved hip and knee strength to at least 4/5 for greater ease with rising from a chair and  with standing > 10 min    Time 6   Period Weeks   Status On-going     PT LONG TERM GOAL #5   Title The patient will be able to ambulate 800 feet in six minutes needed for shorter distance community ambulation for traveling   Time 6   Period Weeks   Status On-going     PT LONG TERM GOAL #6   Title BERG balance score improved to 42/56 indicating improved safety with walking and standing dynamic activities   Time 6   Period Weeks   Status On-going               Plan - 08/09/16 0806    Clinical Impression Statement Treatment focus today on gait endurance and speed needed as preparation for trip to [place] next month.  Improved stride length noted but continues to use wide base of support for stability.  Metronome used to increase gait cadence.  Close contact-guard for  safety.  Periodic rest breaks  secondary to dizziness.     Clinical Impairments Affecting Rehab Potential high risk of falls   PT Treatment/Interventions ADLs/Self Care Home Management;Electrical Stimulation;Moist Heat;Therapeutic exercise;Therapeutic activities;Balance training;Neuromuscular re-education;Patient/family education;Taping;Manual techniques   PT Next Visit Plan  Nu-Step;  LE strengthening;  gait speed and endurance;   standing balance backwards weight shifting;  narrow base of support, decreased UE support;   therapeutic activities;  KX every visit    Recommended Other Services recert signed 9/47      Patient will benefit from skilled therapeutic intervention in order to improve the following deficits and impairments:  Decreased activity tolerance, Difficulty walking, Decreased strength, Pain, Decreased endurance, Abnormal gait, Decreased balance, Decreased coordination  Visit Diagnosis: Unspecified lack of coordination  Muscle weakness (generalized)  Repeated falls  Difficulty in walking, not elsewhere classified     Problem List Patient Active Problem List   Diagnosis Date Noted  . Leg edema, right 02/20/2015  . BPH (benign prostatic hypertrophy) 12/09/2014  . Chronic radicular low back pain 08/06/2012  . COPD (chronic obstructive pulmonary disease) ([place]) 04/21/2012  . Hyponatremia 03/29/2012  . Localized osteoarthrosis not specified whether primary or secondary, lower leg 06/20/2010  . VARICOSE VEINS LOWER EXTREMITIES W/INFLAMMATION 02/12/2010  . [place] LOCALIZED OSTEOARTHROSIS INVOLVING HAND 10/16/2009  . DUPUYTREN'S CONTRACTURE, RIGHT 07/04/2009  . DEGENERATIVE DISC DISEASE, LUMBOSACRAL SPINE W/RADICULOPATHY 06/19/2009  . CARCINOMA, BASAL CELL 02/07/2009  . ANEMIA OF CHRONIC DISEASE 02/07/2009  . BURSITIS, RIGHT SHOULDER 10/07/2008  . CATARACT ASSOCIATED WITH OTHER SYNDROMES 08/01/2008  . DRY SKIN 08/01/2008  . CERUMEN IMPACTION, BILATERAL 03/01/2008  . CRAMP  IN LIMB 01/18/2008  . DEPRESSION, SITUATIONAL, ACUTE 12/28/2007  . CONSTIPATION, DRUG INDUCED 12/28/2007  . POLYNEUROPATHY OTHER DISEASES CLASSIFIED ELSW 09/24/2007  . WEIGHT LOSS, ABNORMAL 01/07/2007  . Hyperlipidemia 09/22/2006  . Essential hypertension 09/22/2006  . GERD 09/22/2006  . OSTEOARTHRITIS 09/22/2006   Ruben Im, PT 08/09/16 8:46 AM Phone: [phone] Fax: [phone]  Alvera Singh 08/09/2016, 8:45 AM  [facility] [address], [place], [place], [address] Phone: [phone]   Fax:  [phone]  Name: Darin [name] MRN: [id number] Date of Birth: [birth year]

## 2016-08-13 ENCOUNTER — Ambulatory Visit: Payer: Medicare Other

## 2016-08-13 DIAGNOSIS — R279 Unspecified lack of coordination: Secondary | ICD-10-CM | POA: Diagnosis not present

## 2016-08-13 DIAGNOSIS — M6281 Muscle weakness (generalized): Secondary | ICD-10-CM | POA: Diagnosis not present

## 2016-08-13 DIAGNOSIS — R262 Difficulty in walking, not elsewhere classified: Secondary | ICD-10-CM

## 2016-08-13 DIAGNOSIS — R296 Repeated falls: Secondary | ICD-10-CM | POA: Diagnosis not present

## 2016-08-13 DIAGNOSIS — M25552 Pain in left hip: Secondary | ICD-10-CM | POA: Diagnosis not present

## 2016-08-13 NOTE — Therapy (Signed)
[facility] [address] [place], [place], [address] Phone: [phone]   Fax:  [phone]  Physical Therapy Treatment  Patient Details  Name: Darin [name] MRN: [id number] Date of Birth: 06/27/1931 Referring Provider: Dr. Elease Hashimoto  Encounter Date: 08/13/2016      PT End of Session - 08/13/16 0842    Visit Number 18   Number of Visits 20   Date for PT Re-Evaluation 09/20/2016   Authorization Type G codes visit 20;  KX now (previous PT this year)   PT Start Time 0805   PT Stop Time 0850   PT Time Calculation (min) 45 min   Activity Tolerance Patient tolerated treatment well   Behavior During Therapy [facility] for tasks assessed/performed      Past Medical History:  Diagnosis Date  . Arthritis   . GERD (gastroesophageal reflux disease)   . H/O hiatal hernia    "FOR YEARS" " NO PROBLEM"  . Hyperlipidemia   . Hypertension   . Knee pain   . Neuropathy    PERIPHERAL   . Osteoporosis   . PMR (polymyalgia rheumatica) (HCC)   . Polymyalgia ([place]) 5 YEARS    Past Surgical History:  Procedure Laterality Date  . CARPAL TUNNEL RELEASE    . CATARACT EXTRACTION     right  . INGUINAL HERNIA REPAIR  10/14/2011   Procedure: HERNIA REPAIR INGUINAL ADULT;  Surgeon: [name], MD;  Location: [facility];  Service: General;  Laterality: Right;  Right Inguinal Hernia Repair with Mesh  . TONSILLECTOMY    . VARICOSE VEIN SURGERY      There were no vitals filed for this visit.      Subjective Assessment - 08/13/16 0813    Subjective Pt reports that his balance/endurance is improving slowly.     Pertinent History trip to [facility] Sep 21, 2022   Currently in Pain? Yes   Pain Score 6    Pain Location Leg   Pain Orientation Right;Left   Pain Descriptors / Indicators Sore   Pain Type Chronic pain   Pain Onset More than a month ago   Pain Frequency Constant   Aggravating Factors  night time and mornings   Pain Relieving Factors not  much                         [facility] Adult PT Treatment/Exercise - 08/13/16 0001      Therapeutic Activites    ADL's sit to stand, walking, standing, up and down steps     Knee/Hip Exercises: Stretches   Active Hamstring Stretch Right;Left;5 reps   Active Hamstring Stretch Limitations on step    Other Knee/Hip Stretches stair hip flexor stretch 5x right/left   Other Knee/Hip Stretches calf stretch on step 3x right/left     Knee/Hip Exercises: Aerobic   Stationary Bike 6 min  Therapist present   Nustep L3 10 min seat 11  Therapist present to discuss treatment and progress     Knee/Hip Exercises: Standing   Rebounder 3 direction weight shifting, marching  x1 minute each   Other Standing Knee Exercises floor ladder forward, side stepping , in/outs 2 laps each     Knee/Hip Exercises: Seated   Long Arc Quad Strengthening;Right;Left;20 reps   Long Arc Quad Limitations blue band and 3# ankle weights   Clamshell with TheraBand Blue  30x   Marching Strengthening;Right;Left;10 reps   Sit to [employer] 20 reps  From [employer]  PT Short Term Goals - 08/09/16 0758      PT SHORT TERM GOAL #1   Title be independent in initial HEP  07/09/16   Status Achieved     PT SHORT TERM GOAL #2   Title Activities-Specific Balance Confidence (ABC) Scale improved to 74% indicating improved confidence in his steadiness with functional activities at home and in the community   Status Achieved     PT SHORT TERM GOAL #3   Title The patient will have improved hip flexor muscle length to 5 degrees and HS length to 55 degrees needed for improved stride length with ambulation   Status Achieved     PT SHORT TERM GOAL #4   Title BERG balance test improved  to 38/56 indicating improved safety for standing ADLs like grooming and dresssing   Status Achieved     PT SHORT TERM GOAL #5   Title Dynamic Gait Index improved to 14 indicating improved balance with dynamic functional  activities like walking and climbing stairs   Status Achieved           PT Long Term Goals - 08/13/16 0815      PT LONG TERM GOAL #1   Title be independent in advanced HEP for further improvements in strength, ROM and balance  08/06/16   Time 6   Period Weeks   Status On-going     PT LONG TERM GOAL #5   Title The patient will be able to ambulate 800 feet in six minutes needed for shorter distance community ambulation for traveling   Baseline 480   Time 6   Period Weeks   Status On-going               Plan - 08/13/16 0818    Clinical Impression Statement Pt is focusing on gait, endurance and gait speed in preparation for trip to [place] next wmonth.  Pt with improved stride length using the floor ladder after 1-2 laps.  Pt continues to use wide base of support for stability.  Pt required close contact guard for safety.  Pt will continue to benefit from skilled PT due to chronic gait and balance deficits.     Rehab Potential Good   Clinical Impairments Affecting Rehab Potential high risk of falls   PT Frequency 2x / week   PT Duration 6 weeks   PT Treatment/Interventions ADLs/Self Care Home Management;Electrical Stimulation;Moist Heat;Therapeutic exercise;Therapeutic activities;Balance training;Neuromuscular re-education;Patient/family education;Taping;Manual techniques   PT Next Visit Plan  Nu-Step;  LE strengthening;  gait speed and endurance;   standing balance backwards weight shifting;  narrow base of support, decreased UE support;   therapeutic activities;  KX every visit    Consulted and Agree with Plan of Care Patient      Patient will benefit from skilled therapeutic intervention in order to improve the following deficits and impairments:  Decreased activity tolerance, Difficulty walking, Decreased strength, Pain, Decreased endurance, Abnormal gait, Decreased balance, Decreased coordination  Visit Diagnosis: Unspecified lack of coordination  Muscle weakness  (generalized)  Repeated falls  Difficulty in walking, not elsewhere classified     Problem List Patient Active Problem List   Diagnosis Date Noted  . Leg edema, right 02/20/2015  . BPH (benign prostatic hypertrophy) 12/09/2014  . Chronic radicular low back pain 08/06/2012  . COPD (chronic obstructive pulmonary disease) ([place]) 04/21/2012  . Hyponatremia 03/29/2012  . Localized osteoarthrosis not specified whether primary or secondary, lower leg 06/20/2010  . VARICOSE VEINS LOWER EXTREMITIES W/INFLAMMATION 02/12/2010  .  [place] LOCALIZED OSTEOARTHROSIS INVOLVING HAND 10/16/2009  . DUPUYTREN'S CONTRACTURE, RIGHT 07/04/2009  . DEGENERATIVE DISC DISEASE, LUMBOSACRAL SPINE W/RADICULOPATHY 06/19/2009  . CARCINOMA, BASAL CELL 02/07/2009  . ANEMIA OF CHRONIC DISEASE 02/07/2009  . BURSITIS, RIGHT SHOULDER 10/07/2008  . CATARACT ASSOCIATED WITH OTHER SYNDROMES 08/01/2008  . DRY SKIN 08/01/2008  . CERUMEN IMPACTION, BILATERAL 03/01/2008  . CRAMP IN LIMB 01/18/2008  . DEPRESSION, SITUATIONAL, ACUTE 12/28/2007  . CONSTIPATION, DRUG INDUCED 12/28/2007  . POLYNEUROPATHY OTHER DISEASES CLASSIFIED ELSW 09/24/2007  . WEIGHT LOSS, ABNORMAL 01/07/2007  . Hyperlipidemia 09/22/2006  . Essential hypertension 09/22/2006  . GERD 09/22/2006  . OSTEOARTHRITIS 09/22/2006     Sigurd Sos, PT 08/13/16 8:47 AM  [facility] [address], [place], [place], [address] Phone: [phone]   Fax:  [phone]  Name: Darin [name] MRN: [id number] Date of Birth: 1931-10-30

## 2016-08-16 ENCOUNTER — Ambulatory Visit: Payer: Medicare Other | Admitting: Physical Therapy

## 2016-08-20 ENCOUNTER — Telehealth: Payer: Self-pay | Admitting: Family Medicine

## 2016-08-20 ENCOUNTER — Ambulatory Visit: Payer: Medicare Other

## 2016-08-20 DIAGNOSIS — R262 Difficulty in walking, not elsewhere classified: Secondary | ICD-10-CM | POA: Diagnosis not present

## 2016-08-20 DIAGNOSIS — R296 Repeated falls: Secondary | ICD-10-CM | POA: Diagnosis not present

## 2016-08-20 DIAGNOSIS — R279 Unspecified lack of coordination: Secondary | ICD-10-CM

## 2016-08-20 DIAGNOSIS — M6281 Muscle weakness (generalized): Secondary | ICD-10-CM

## 2016-08-20 DIAGNOSIS — M25552 Pain in left hip: Secondary | ICD-10-CM | POA: Diagnosis not present

## 2016-08-20 NOTE — Telephone Encounter (Signed)
Pt is asking for a refill (90 day supply) of famotidine (PEPCID) 20 MG tablet to be sent to the [facility] mail pharmacy.

## 2016-08-20 NOTE — Therapy (Signed)
[facility] - [facility] [address], [place], [place], [address] Phone: [phone]   Fax:  [phone]  Physical Therapy Treatment  Patient Details  Name: Darin [name] MRN: [id number] Date of Birth: 1931-06-22 Referring Provider: Dr. Elease Hashimoto  Encounter Date: 08/20/2016      PT End of Session - 08/20/16 1038    Visit Number 19   Number of Visits 29   Date for PT Re-Evaluation 09/23/2016   Authorization Type G codes visit 21;  KX now (previous PT this year)   PT Start Time 0950   PT Stop Time 1045  10 min on bike unattended   PT Time Calculation (min) 55 min   Activity Tolerance Patient tolerated treatment well   Behavior During Therapy [facility] for tasks assessed/performed      Past Medical History:  Diagnosis Date  . Arthritis   . GERD (gastroesophageal reflux disease)   . H/O hiatal hernia    "FOR YEARS" " NO PROBLEM"  . Hyperlipidemia   . Hypertension   . Knee pain   . Neuropathy    PERIPHERAL   . Osteoporosis   . PMR (polymyalgia rheumatica) (HCC)   . Polymyalgia ([place]) 5 YEARS    Past Surgical History:  Procedure Laterality Date  . CARPAL TUNNEL RELEASE    . CATARACT EXTRACTION     right  . INGUINAL HERNIA REPAIR  10/14/2011   Procedure: HERNIA REPAIR INGUINAL ADULT;  Surgeon: [name], MD;  Location: [place];  Service: General;  Laterality: Right;  Right Inguinal Hernia Repair with Mesh  . TONSILLECTOMY    . VARICOSE VEIN SURGERY      There were no vitals filed for this visit.      Subjective Assessment - 08/20/16 1019    Subjective Pt traveled to [place] over the weekend.  Endurance is improving slowly   Pertinent History trip to [place] 2022-09-24   Currently in Pain? Yes   Pain Score 5    Pain Location Leg   Pain Orientation Right;Left   Pain Descriptors / Indicators Sore   Pain Type Chronic pain   Pain Onset More than a month ago   Pain Frequency Constant   Aggravating Factors  morning hours,  night   Pain Relieving Factors it is just always there                         [facility] Adult PT Treatment/Exercise - 08/20/16 0001      Therapeutic Activites    ADL's sit to stand, walking, standing, up and down steps     Knee/Hip Exercises: Stretches   Active Hamstring Stretch Right;Left;5 reps   Active Hamstring Stretch Limitations on step    Other Knee/Hip Stretches stair hip flexor stretch 5x right/left   Other Knee/Hip Stretches calf stretch on step 3x right/left     Knee/Hip Exercises: Aerobic   Stationary Bike 6 min  Therapist present   Nustep L3 10 min seat 11  Therapist present to discuss treatment and progress     Knee/Hip Exercises: Standing   Rebounder 3 direction weight shifting, marching  x1 minute each   Other Standing Knee Exercises floor ladder forward, side stepping , in/outs 2 laps each     Knee/Hip Exercises: Seated   Long Arc Quad Strengthening;Right;Left;20 reps;Weights   Long Arc Quad Weight 4 lbs.   Long [employer] Limitations blue band and 3# ankle weights   Clamshell with [employer]  30x   Marching Strengthening;Right;Left;10 reps   Marching Limitations 4#   Sit to [employer] 20 reps  From [employer]                  PT Short Term Goals - 08/09/16 0758      PT SHORT TERM GOAL #1   Title be independent in initial HEP  07/09/16   Status Achieved     PT SHORT TERM GOAL #2   Title Activities-Specific Balance Confidence (ABC) Scale improved to 74% indicating improved confidence in his steadiness with functional activities at home and in the community   Status Achieved     PT SHORT TERM GOAL #3   Title The patient will have improved hip flexor muscle length to 5 degrees and HS length to 55 degrees needed for improved stride length with ambulation   Status Achieved     PT SHORT TERM GOAL #4   Title BERG balance test improved  to 38/56 indicating improved safety for standing ADLs like grooming and dresssing   Status Achieved      PT SHORT TERM GOAL #5   Title Dynamic Gait Index improved to 14 indicating improved balance with dynamic functional activities like walking and climbing stairs   Status Achieved           PT Long Term Goals - 08/13/16 0815      PT LONG TERM GOAL #1   Title be independent in advanced HEP for further improvements in strength, ROM and balance  08/06/16   Time 6   Period Weeks   Status On-going     PT LONG TERM GOAL #5   Title The patient will be able to ambulate 800 feet in six minutes needed for shorter distance community ambulation for traveling   Baseline 480   Time 6   Period Weeks   Status On-going             Patient will benefit from skilled therapeutic intervention in order to improve the following deficits and impairments:     Visit Diagnosis: Unspecified lack of coordination  Muscle weakness (generalized)  Repeated falls       G-Codes - September 10, 2016 1019    Functional Assessment Tool Used (Outpatient Only) clinical judgement   Functional Limitation Mobility: Walking and moving around   Mobility: Walking and Moving Around Current Status (A8341) At least 40 percent but less than 60 percent impaired, limited or restricted   Mobility: Walking and Moving Around Goal Status [phone]) At least 20 percent but less than 40 percent impaired, limited or restricted      Problem List Patient Active Problem List   Diagnosis Date Noted  . Leg edema, right 02/20/2015  . BPH (benign prostatic hypertrophy) 12/09/2014  . Chronic radicular low back pain 08/06/2012  . COPD (chronic obstructive pulmonary disease) ([place]) 04/21/2012  . Hyponatremia 03/29/2012  . Localized osteoarthrosis not specified whether primary or secondary, lower leg 06/20/2010  . VARICOSE VEINS LOWER EXTREMITIES W/INFLAMMATION 02/12/2010  . [place] LOCALIZED OSTEOARTHROSIS INVOLVING HAND 10/16/2009  . DUPUYTREN'S CONTRACTURE, RIGHT 07/04/2009  . DEGENERATIVE DISC DISEASE, LUMBOSACRAL SPINE W/RADICULOPATHY  06/19/2009  . CARCINOMA, BASAL CELL 02/07/2009  . ANEMIA OF CHRONIC DISEASE 02/07/2009  . BURSITIS, RIGHT SHOULDER 10/07/2008  . CATARACT ASSOCIATED WITH OTHER SYNDROMES 08/01/2008  . DRY SKIN 08/01/2008  . CERUMEN IMPACTION, BILATERAL 03/01/2008  . CRAMP IN LIMB 01/18/2008  . DEPRESSION, SITUATIONAL, ACUTE 12/28/2007  . CONSTIPATION, DRUG INDUCED 12/28/2007  . POLYNEUROPATHY OTHER DISEASES CLASSIFIED  ELSW 09/24/2007  . WEIGHT LOSS, ABNORMAL 01/07/2007  . Hyperlipidemia 09/22/2006  . Essential hypertension 09/22/2006  . GERD 09/22/2006  . OSTEOARTHRITIS 09/22/2006     Sigurd Sos, PT 08/20/16 10:40 AM  [facility] [address] [place], [place], [address] Phone: [phone]   Fax:  [phone]  Name: Andri Prestia MRN: [id number] Date of Birth: August 30, 1931

## 2016-08-21 MED ORDER — FAMOTIDINE 20 MG PO TABS: 20.0000 mg | ORAL_TABLET | Freq: Every day | ORAL | 1 refills | Status: DC

## 2016-08-21 NOTE — Telephone Encounter (Signed)
Rx done. 

## 2016-08-23 ENCOUNTER — Ambulatory Visit: Payer: Medicare Other | Attending: Family Medicine | Admitting: Physical Therapy

## 2016-08-23 ENCOUNTER — Encounter: Payer: Self-pay | Admitting: Physical Therapy

## 2016-08-23 DIAGNOSIS — R262 Difficulty in walking, not elsewhere classified: Secondary | ICD-10-CM | POA: Diagnosis not present

## 2016-08-23 DIAGNOSIS — M6281 Muscle weakness (generalized): Secondary | ICD-10-CM

## 2016-08-23 DIAGNOSIS — R279 Unspecified lack of coordination: Secondary | ICD-10-CM

## 2016-08-23 DIAGNOSIS — R296 Repeated falls: Secondary | ICD-10-CM | POA: Diagnosis not present

## 2016-08-23 NOTE — Therapy (Signed)
[facility] [address], [place], [place], [address] Phone: [phone]   Fax:  [phone]  Physical Therapy Treatment  Patient Details  Name: Darin [name] MRN: [id number] Date of Birth: Jan 14, 1932 Referring Provider: Dr. Elease Hashimoto  Encounter Date: 08/23/2016      PT End of Session - 08/23/16 1020    Visit Number 20   Number of Visits 29   Date for PT Re-Evaluation 2016/10/13   Authorization Type G codes visit 5;  KX now (previous PT this year)   PT Start Time 1017   PT Stop Time 1100   PT Time Calculation (min) 43 min   Activity Tolerance Patient tolerated treatment well   Behavior During Therapy [facility] for tasks assessed/performed      Past Medical History:  Diagnosis Date  . Arthritis   . GERD (gastroesophageal reflux disease)   . H/O hiatal hernia    "FOR YEARS" " NO PROBLEM"  . Hyperlipidemia   . Hypertension   . Knee pain   . Neuropathy    PERIPHERAL   . Osteoporosis   . PMR (polymyalgia rheumatica) (HCC)   . Polymyalgia ([place]) 5 YEARS    Past Surgical History:  Procedure Laterality Date  . CARPAL TUNNEL RELEASE    . CATARACT EXTRACTION     right  . INGUINAL HERNIA REPAIR  10/14/2011   Procedure: HERNIA REPAIR INGUINAL ADULT;  Surgeon: [name], MD;  Location: [place];  Service: General;  Laterality: Right;  Right Inguinal Hernia Repair with Mesh  . TONSILLECTOMY    . VARICOSE VEIN SURGERY      There were no vitals filed for this visit.      Subjective Assessment - 08/23/16 1019    Subjective Doing well today, not much different. Usual leg pain.    Pertinent History trip to [facility] 10-14-22   Limitations Walking;House hold activities;Standing   How long can you sit comfortably? as long as  I want but trouble rising following    Diagnostic tests none recently   Patient Stated Goals walk normally;  maintain my balance   Currently in Pain? Yes   Pain Score 5    Pain Location Leg   Pain  Orientation Left;Right   Pain Descriptors / Indicators Sore   Pain Type Chronic pain                         [facility] Adult PT Treatment/Exercise - 08/23/16 0001      Therapeutic Activites    ADL's sit to stand, walking, standing, up and down steps     Knee/Hip Exercises: Stretches   Active Hamstring Stretch Right;Left;5 reps   Active Hamstring Stretch Limitations on step    Other Knee/Hip Stretches stair hip flexor stretch 5x right/left   Other Knee/Hip Stretches calf stretch on step 3x right/left     Knee/Hip Exercises: Aerobic   Stationary Bike 6 min  Therapist present   Nustep L3 10 min seat 11  Therapist present to discuss treatment and progress     Knee/Hip Exercises: Standing   Rebounder 3 direction weight shifting, marching  x1 minute each   Other Standing Knee Exercises floor ladder forward, side stepping , in/outs 2 laps each     Knee/Hip Exercises: Seated   Long Arc Quad --   Long Arc [employer] --   Clamshell with TheraBand --   Marching --   Marching Limitations --   Sit  to [place] --                  PT Short Term Goals - 08/09/16 0758      PT SHORT TERM GOAL #1   Title be independent in initial HEP  07/09/16   Status Achieved     PT SHORT TERM GOAL #2   Title Activities-Specific Balance Confidence (ABC) Scale improved to 74% indicating improved confidence in his steadiness with functional activities at home and in the community   Status Achieved     PT SHORT TERM GOAL #3   Title The patient will have improved hip flexor muscle length to 5 degrees and HS length to 55 degrees needed for improved stride length with ambulation   Status Achieved     PT SHORT TERM GOAL #4   Title BERG balance test improved  to 38/56 indicating improved safety for standing ADLs like grooming and dresssing   Status Achieved     PT SHORT TERM GOAL #5   Title Dynamic Gait Index improved to 14 indicating improved balance with dynamic functional  activities like walking and climbing stairs   Status Achieved           PT Long Term Goals - 08/13/16 0815      PT LONG TERM GOAL #1   Title be independent in advanced HEP for further improvements in strength, ROM and balance  08/06/16   Time 6   Period Weeks   Status On-going     PT LONG TERM GOAL #5   Title The patient will be able to ambulate 800 feet in six minutes needed for shorter distance community ambulation for traveling   Baseline 480   Time 6   Period Weeks   Status On-going               Plan - 08/23/16 1056    Clinical Impression Statement Patient did well with floor ladder. Continues to have some difficulty with stride length while walking forward. Balance and LE strength continue to improve. Patient will continue to benefit from skilled therapy for strengthening and balance.    Rehab Potential Good   Clinical Impairments Affecting Rehab Potential high risk of falls   PT Frequency 2x / week   PT Duration 6 weeks   PT Treatment/Interventions ADLs/Self Care Home Management;Electrical Stimulation;Moist Heat;Therapeutic exercise;Therapeutic activities;Balance training;Neuromuscular re-education;Patient/family education;Taping;Manual techniques   PT Next Visit Plan  Nu-Step;  LE strengthening;  gait speed and endurance;   standing balance backwards weight shifting;  narrow base of support, decreased UE support;   therapeutic activities;  KX every visit    Consulted and Agree with Plan of Care Patient      Patient will benefit from skilled therapeutic intervention in order to improve the following deficits and impairments:  Decreased activity tolerance, Difficulty walking, Decreased strength, Pain, Decreased endurance, Abnormal gait, Decreased balance, Decreased coordination  Visit Diagnosis: Unspecified lack of coordination  Muscle weakness (generalized)  Repeated falls  Difficulty in walking, not elsewhere classified     Problem List Patient Active  Problem List   Diagnosis Date Noted  . Leg edema, right 02/20/2015  . BPH (benign prostatic hypertrophy) 12/09/2014  . Chronic radicular low back pain 08/06/2012  . COPD (chronic obstructive pulmonary disease) ([place]) 04/21/2012  . Hyponatremia 03/29/2012  . Localized osteoarthrosis not specified whether primary or secondary, lower leg 06/20/2010  . VARICOSE VEINS LOWER EXTREMITIES W/INFLAMMATION 02/12/2010  . [place] LOCALIZED OSTEOARTHROSIS INVOLVING HAND 10/16/2009  .  DUPUYTREN'S CONTRACTURE, RIGHT 07/04/2009  . DEGENERATIVE DISC DISEASE, LUMBOSACRAL SPINE W/RADICULOPATHY 06/19/2009  . CARCINOMA, BASAL CELL 02/07/2009  . ANEMIA OF CHRONIC DISEASE 02/07/2009  . BURSITIS, RIGHT SHOULDER 10/07/2008  . CATARACT ASSOCIATED WITH OTHER SYNDROMES 08/01/2008  . DRY SKIN 08/01/2008  . CERUMEN IMPACTION, BILATERAL 03/01/2008  . CRAMP IN LIMB 01/18/2008  . DEPRESSION, SITUATIONAL, ACUTE 12/28/2007  . CONSTIPATION, DRUG INDUCED 12/28/2007  . POLYNEUROPATHY OTHER DISEASES CLASSIFIED ELSW 09/24/2007  . WEIGHT LOSS, ABNORMAL 01/07/2007  . Hyperlipidemia 09/22/2006  . Essential hypertension 09/22/2006  . GERD 09/22/2006  . OSTEOARTHRITIS 09/22/2006    Jeanie Sewer PTA 08/23/2016, 11:00 AM  [facility] [address] [place], [place], [address] Phone: [phone]   Fax:  [phone]  Name: Tavious Griesinger MRN: [id number] Date of Birth: 09/09/1931

## 2016-08-27 ENCOUNTER — Ambulatory Visit: Payer: Medicare Other | Admitting: Physical Therapy

## 2016-08-27 ENCOUNTER — Encounter: Payer: Self-pay | Admitting: Physical Therapy

## 2016-08-27 DIAGNOSIS — R262 Difficulty in walking, not elsewhere classified: Secondary | ICD-10-CM | POA: Diagnosis not present

## 2016-08-27 DIAGNOSIS — R279 Unspecified lack of coordination: Secondary | ICD-10-CM | POA: Diagnosis not present

## 2016-08-27 DIAGNOSIS — M6281 Muscle weakness (generalized): Secondary | ICD-10-CM

## 2016-08-27 DIAGNOSIS — R296 Repeated falls: Secondary | ICD-10-CM

## 2016-08-27 NOTE — Therapy (Signed)
[facility] [address], [place], [place], [address] Phone: [phone]   Fax:  [phone]  Physical Therapy Treatment  Patient Details  Name: Darin [name] MRN: [id number] Date of Birth: Oct 27, 1931 Referring Provider: Dr. Elease Hashimoto  Encounter Date: 08/27/2016      PT End of Session - 08/27/16 1012    Visit Number 21   Number of Visits 29   Date for PT Re-Evaluation 10-12-16   Authorization Type G codes visit 78;  KX now (previous PT this year)   PT Start Time 0930   PT Stop Time 1018   PT Time Calculation (min) 48 min   Activity Tolerance Patient tolerated treatment well   Behavior During Therapy [facility] for tasks assessed/performed      Past Medical History:  Diagnosis Date  . Arthritis   . GERD (gastroesophageal reflux disease)   . H/O hiatal hernia    "FOR YEARS" " NO PROBLEM"  . Hyperlipidemia   . Hypertension   . Knee pain   . Neuropathy    PERIPHERAL   . Osteoporosis   . PMR (polymyalgia rheumatica) (HCC)   . Polymyalgia ([place]) 5 YEARS    Past Surgical History:  Procedure Laterality Date  . CARPAL TUNNEL RELEASE    . CATARACT EXTRACTION     right  . INGUINAL HERNIA REPAIR  10/14/2011   Procedure: HERNIA REPAIR INGUINAL ADULT;  Surgeon: [name], MD;  Location: [place];  Service: General;  Laterality: Right;  Right Inguinal Hernia Repair with Mesh  . TONSILLECTOMY    . VARICOSE VEIN SURGERY      There were no vitals filed for this visit.      Subjective Assessment - 08/27/16 0935    Subjective No falls in the past 3 weeks. Last time I used a cane due to taking trash out and taking the trash out due to the distance. Patient does not have to stop to rest while taking trash out.    Pertinent History trip to [facility] 10-13-2022   Limitations Walking;House hold activities;Standing   How long can you sit comfortably? as long as  I want but trouble rising following    Diagnostic tests none recently    Patient Stated Goals walk normally;  maintain my balance   Currently in Pain? Yes   Pain Score 6    Pain Location Leg   Pain Orientation Right;Left   Pain Descriptors / Indicators Sore   Pain Type Chronic pain   Pain Onset More than a month ago   Pain Frequency Constant   Aggravating Factors  morning hours, night   Pain Relieving Factors it is just always there   Multiple Pain Sites No            [facility] PT Assessment - 08/27/16 0001      6 minute walk test results    Aerobic Endurance Distance Walked 800                     [facility] Adult PT Treatment/Exercise - 08/27/16 0001      Knee/Hip Exercises: Stretches   Active Hamstring Stretch Right;Left;30 seconds;3 reps  vc on straight spine   Active Hamstring Stretch Limitations on step    Other Knee/Hip Stretches stair hip flexor stretch 5x right/left with VC to tighten buttocks   Other Knee/Hip Stretches calf stretch on step 3x right/left     Knee/Hip Exercises: Aerobic   Stationary Bike 6 min  Therapist present   Nustep L4 10 min seat 11  Therapist present to discuss treatment and progress     Knee/Hip Exercises: Seated   Long Arc Quad Right;Left;Strengthening;20 reps  black band   Long Arc Quad Limitations easier on left   Clamshell with [employer] --  30x; black band                  PT Short Term Goals - 08/09/16 0758      PT SHORT TERM GOAL #1   Title be independent in initial HEP  07/09/16   Status Achieved     PT SHORT TERM GOAL #2   Title Activities-Specific Balance Confidence (ABC) Scale improved to 74% indicating improved confidence in his steadiness with functional activities at home and in the community   Status Achieved     PT SHORT TERM GOAL #3   Title The patient will have improved hip flexor muscle length to 5 degrees and HS length to 55 degrees needed for improved stride length with ambulation   Status Achieved     PT SHORT TERM GOAL #4   Title BERG balance test improved   to 38/56 indicating improved safety for standing ADLs like grooming and dresssing   Status Achieved     PT SHORT TERM GOAL #5   Title Dynamic Gait Index improved to 14 indicating improved balance with dynamic functional activities like walking and climbing stairs   Status Achieved           PT Long Term Goals - 08/27/16 0940      PT LONG TERM GOAL #1   Title be independent in advanced HEP for further improvements in strength, ROM and balance  08/06/16   Time 6   Period Weeks   Status On-going     PT LONG TERM GOAL #2   Title Activities Specific Balance Confidence Scale improved to 82% self confidence in his steadiness with functional activities   Time 6   Period Weeks   Status On-going     PT LONG TERM GOAL #3   Title Dynamic Gait Index improved to 17 indicating improved balance with functional activities like stairs    Time 6   Period Weeks   Status On-going     PT LONG TERM GOAL #4   Title Improved hip and knee strength to at least 4/5 for greater ease with rising from a chair and  with standing > 10 min    Time 6   Period Weeks   Status On-going     PT LONG TERM GOAL #5   Title The patient will be able to ambulate 800 feet in six minutes needed for shorter distance community ambulation for traveling   Baseline 480   Time 6   Period Weeks   Status Achieved     PT LONG TERM GOAL #6   Title BERG balance score improved to 42/56 indicating improved safety with walking and standing dynamic activities   Time 6   Period Weeks   Status On-going               Plan - 08/27/16 0934    Clinical Impression Statement Patient has increased resistance of the band with leg exercises and nustep and no diffiuclty.  Patient  was able to ambulate 800 feet in 6 min. Patient is standing more erect in therapy.  Patient will benefit from skilled therapy to increase strength, balance and endurance.    Rehab Potential Good  Clinical Impairments Affecting Rehab Potential high  risk of falls   PT Frequency 2x / week   PT Duration 6 weeks   PT Treatment/Interventions ADLs/Self Care Home Management;Electrical Stimulation;Moist Heat;Therapeutic exercise;Therapeutic activities;Balance training;Neuromuscular re-education;Patient/family education;Taping;Manual techniques   PT Next Visit Plan  Nu-Step;  LE strengthening;  gait speed and endurance;   standing balance backwards weight shifting;  narrow base of support, decreased UE support;   therapeutic activities;  KX every visit    PT Home Exercise Plan progress as needed   Consulted and Agree with Plan of Care Patient      Patient will benefit from skilled therapeutic intervention in order to improve the following deficits and impairments:  Decreased activity tolerance, Difficulty walking, Decreased strength, Pain, Decreased endurance, Abnormal gait, Decreased balance, Decreased coordination  Visit Diagnosis: Unspecified lack of coordination  Muscle weakness (generalized)  Repeated falls  Difficulty in walking, not elsewhere classified     Problem List Patient Active Problem List   Diagnosis Date Noted  . Leg edema, right 02/20/2015  . BPH (benign prostatic hypertrophy) 12/09/2014  . Chronic radicular low back pain 08/06/2012  . COPD (chronic obstructive pulmonary disease) ([place]) 04/21/2012  . Hyponatremia 03/29/2012  . Localized osteoarthrosis not specified whether primary or secondary, lower leg 06/20/2010  . VARICOSE VEINS LOWER EXTREMITIES W/INFLAMMATION 02/12/2010  . [place] LOCALIZED OSTEOARTHROSIS INVOLVING HAND 10/16/2009  . DUPUYTREN'S CONTRACTURE, RIGHT 07/04/2009  . DEGENERATIVE DISC DISEASE, LUMBOSACRAL SPINE W/RADICULOPATHY 06/19/2009  . CARCINOMA, BASAL CELL 02/07/2009  . ANEMIA OF CHRONIC DISEASE 02/07/2009  . BURSITIS, RIGHT SHOULDER 10/07/2008  . CATARACT ASSOCIATED WITH OTHER SYNDROMES 08/01/2008  . DRY SKIN 08/01/2008  . CERUMEN IMPACTION, BILATERAL 03/01/2008  . CRAMP IN LIMB 01/18/2008   . DEPRESSION, SITUATIONAL, ACUTE 12/28/2007  . CONSTIPATION, DRUG INDUCED 12/28/2007  . POLYNEUROPATHY OTHER DISEASES CLASSIFIED ELSW 09/24/2007  . WEIGHT LOSS, ABNORMAL 01/07/2007  . Hyperlipidemia 09/22/2006  . Essential hypertension 09/22/2006  . GERD 09/22/2006  . OSTEOARTHRITIS 09/22/2006    Earlie Counts, PT 08/27/16 10:15 AM   [facility] [address], [place], [place], [address] Phone: [phone]   Fax:  [phone]  Name: Darin [name] MRN: [id number] Date of Birth: [birth year]

## 2016-08-30 ENCOUNTER — Ambulatory Visit: Payer: Medicare Other | Admitting: Physical Therapy

## 2016-09-01 ENCOUNTER — Other Ambulatory Visit: Payer: Self-pay | Admitting: Family Medicine

## 2016-09-03 ENCOUNTER — Ambulatory Visit: Payer: Medicare Other | Admitting: Physical Therapy

## 2016-09-03 ENCOUNTER — Encounter: Payer: Self-pay | Admitting: Physical Therapy

## 2016-09-03 DIAGNOSIS — R296 Repeated falls: Secondary | ICD-10-CM

## 2016-09-03 DIAGNOSIS — H35371 Puckering of macula, right eye: Secondary | ICD-10-CM | POA: Diagnosis not present

## 2016-09-03 DIAGNOSIS — H2512 Age-related nuclear cataract, left eye: Secondary | ICD-10-CM | POA: Diagnosis not present

## 2016-09-03 DIAGNOSIS — Z961 Presence of intraocular lens: Secondary | ICD-10-CM | POA: Diagnosis not present

## 2016-09-03 DIAGNOSIS — H5111 Convergence insufficiency: Secondary | ICD-10-CM | POA: Diagnosis not present

## 2016-09-03 DIAGNOSIS — M6281 Muscle weakness (generalized): Secondary | ICD-10-CM

## 2016-09-03 DIAGNOSIS — R279 Unspecified lack of coordination: Secondary | ICD-10-CM | POA: Diagnosis not present

## 2016-09-03 DIAGNOSIS — R262 Difficulty in walking, not elsewhere classified: Secondary | ICD-10-CM

## 2016-09-03 DIAGNOSIS — I10 Essential (primary) hypertension: Secondary | ICD-10-CM | POA: Diagnosis not present

## 2016-09-03 NOTE — Therapy (Signed)
[facility] [address], [place], [place], [address] Phone: [phone]   Fax:  [phone]  Physical Therapy Treatment  Patient Details  Name: Darin [name] MRN: [id number] Date of Birth: Nov 02, 1931 Referring Provider: Dr. Elease Hashimoto  Encounter Date: 09/03/2016      PT End of Session - 09/03/16 0910    Visit Number 22   Number of Visits 29   Date for PT Re-Evaluation 27-Sep-2016   Authorization Type G codes visit 104;  KX now (previous PT this year)   PT Start Time 0834   PT Stop Time 0923   PT Time Calculation (min) 49 min   Activity Tolerance Patient tolerated treatment well   Behavior During Therapy [facility] for tasks assessed/performed      Past Medical History:  Diagnosis Date  . Arthritis   . GERD (gastroesophageal reflux disease)   . H/O hiatal hernia    "FOR YEARS" " NO PROBLEM"  . Hyperlipidemia   . Hypertension   . Knee pain   . Neuropathy    PERIPHERAL   . Osteoporosis   . PMR (polymyalgia rheumatica) (HCC)   . Polymyalgia ([place]) 5 YEARS    Past Surgical History:  Procedure Laterality Date  . CARPAL TUNNEL RELEASE    . CATARACT EXTRACTION     right  . INGUINAL HERNIA REPAIR  10/14/2011   Procedure: HERNIA REPAIR INGUINAL ADULT;  Surgeon: [name], MD;  Location: [place];  Service: General;  Laterality: Right;  Right Inguinal Hernia Repair with Mesh  . TONSILLECTOMY    . VARICOSE VEIN SURGERY      There were no vitals filed for this visit.      Subjective Assessment - 09/03/16 0845    Subjective Missing Friday has set me back. I am not as limber. I have not exercised at home much.    Pertinent History trip to [facility] 09-28-22   Limitations Walking;House hold activities;Standing   How long can you sit comfortably? as long as  I want but trouble rising following    Diagnostic tests none recently   Patient Stated Goals walk normally;  maintain my balance   Currently in Pain? Yes   Pain Score 6     Pain Location Leg   Pain Orientation Left;Right   Pain Descriptors / Indicators Sore   Pain Type Chronic pain   Pain Onset More than a month ago   Pain Frequency Constant   Aggravating Factors  morning hours, night   Pain Relieving Factors it is just always there            [facility] PT Assessment - 09/03/16 0001      6 minute walk test results    Aerobic Endurance Distance Walked 720     Timed Up and Go Test   Manual TUG (seconds) 15  14 sec, 14 sec.,14 sec                     [facility] Adult PT Treatment/Exercise - 09/03/16 0001      Knee/Hip Exercises: Stretches   Active Hamstring Stretch Right;Left;2 reps;30 seconds  on step   Active Hamstring Stretch Limitations on step    Other Knee/Hip Stretches stair hip flexor stretch 5x right/left with VC to tighten buttocks   Other Knee/Hip Stretches calf stretch on step 3x right/left     Knee/Hip Exercises: Aerobic   Nustep L4 11 min seat 11  Therapist present to discuss treatment and  progress     Knee/Hip Exercises: Seated   Long Arc Quad Right;Left;Strengthening;20 reps   Long Arc Quad Weight 4 lbs.   Long [employer] Limitations can increase weight next visit   Clamshell with TheraBand --  gray, 30x; VC on abdominal contraction   Marching Strengthening;Right;Left;1 set;20 reps;Weights   Marching Limitations VC on abdominal contraction   Marching Weights 4 lbs.                PT Education - 09/03/16 0926    Education provided Yes   Education Details Discussed with patient on looking into to a gym to join for after therapy.    Person(s) Educated Patient   Methods Explanation   Comprehension Verbalized understanding          PT Short Term Goals - 08/09/16 0758      PT SHORT TERM GOAL #1   Title be independent in initial HEP  07/09/16   Status Achieved     PT SHORT TERM GOAL #2   Title Activities-Specific Balance Confidence (ABC) Scale improved to 74% indicating improved confidence in his  steadiness with functional activities at home and in the community   Status Achieved     PT SHORT TERM GOAL #3   Title The patient will have improved hip flexor muscle length to 5 degrees and HS length to 55 degrees needed for improved stride length with ambulation   Status Achieved     PT SHORT TERM GOAL #4   Title BERG balance test improved  to 38/56 indicating improved safety for standing ADLs like grooming and dresssing   Status Achieved     PT SHORT TERM GOAL #5   Title Dynamic Gait Index improved to 14 indicating improved balance with dynamic functional activities like walking and climbing stairs   Status Achieved           PT Long Term Goals - 09/03/16 0857      PT LONG TERM GOAL #1   Title be independent in advanced HEP for further improvements in strength, ROM and balance  08/06/16   Time 6   Period Weeks   Status On-going     PT LONG TERM GOAL #2   Title Activities Specific Balance Confidence Scale improved to 82% self confidence in his steadiness with functional activities   Time 6   Period Weeks   Status On-going  30%     PT LONG TERM GOAL #3   Title Dynamic Gait Index improved to 17 indicating improved balance with functional activities like stairs    Time 6   Period Weeks   Status On-going     PT LONG TERM GOAL #4   Title Improved hip and knee strength to at least 4/5 for greater ease with rising from a chair and  with standing > 10 min    Time 6   Period Weeks   Status On-going     PT LONG TERM GOAL #5   Title The patient will be able to ambulate 800 feet in six minutes needed for shorter distance community ambulation for traveling   Baseline 480   Time 6   Period Weeks   Status Achieved     PT LONG TERM GOAL #6   Title BERG balance score improved to 42/56 indicating improved safety with walking and standing dynamic activities   Time 6   Period Weeks   Status On-going  Plan - 09/03/16 0927    Clinical Impression  Statement Patient TUG score is 14 sec 3 times carrying a cup of water.  Patient does not do much exercise at home so discussed with him on joining a gym to continue keeping up his strength and endurance.  Patient still needs verbal cues to hold his head up with walking.  Patient has 30%  confidence.  Patient will benefit from skilled therapy to increase strength, balance, and endurance.    Rehab Potential Good   Clinical Impairments Affecting Rehab Potential high risk of falls   PT Frequency 2x / week   PT Duration 6 weeks   PT Treatment/Interventions ADLs/Self Care Home Management;Electrical Stimulation;Moist Heat;Therapeutic exercise;Therapeutic activities;Balance training;Neuromuscular re-education;Patient/family education;Taping;Manual techniques   PT Next Visit Plan  Nu-Step;  LE strengthening;  gait speed and endurance;   standing balance backwards weight shifting;  narrow base of support, decreased UE support;   therapeutic activities;  KX every visit    PT Home Exercise Plan progress as needed   Consulted and Agree with Plan of Care Patient      Patient will benefit from skilled therapeutic intervention in order to improve the following deficits and impairments:  Decreased activity tolerance, Difficulty walking, Decreased strength, Pain, Decreased endurance, Abnormal gait, Decreased balance, Decreased coordination  Visit Diagnosis: Unspecified lack of coordination  Muscle weakness (generalized)  Repeated falls  Difficulty in walking, not elsewhere classified     Problem List Patient Active Problem List   Diagnosis Date Noted  . Leg edema, right 02/20/2015  . BPH (benign prostatic hypertrophy) 12/09/2014  . Chronic radicular low back pain 08/06/2012  . COPD (chronic obstructive pulmonary disease) ([place]) 04/21/2012  . Hyponatremia 03/29/2012  . Localized osteoarthrosis not specified whether primary or secondary, lower leg 06/20/2010  . VARICOSE VEINS LOWER EXTREMITIES  W/INFLAMMATION 02/12/2010  . [place] LOCALIZED OSTEOARTHROSIS INVOLVING HAND 10/16/2009  . DUPUYTREN'S CONTRACTURE, RIGHT 07/04/2009  . DEGENERATIVE DISC DISEASE, LUMBOSACRAL SPINE W/RADICULOPATHY 06/19/2009  . CARCINOMA, BASAL CELL 02/07/2009  . ANEMIA OF CHRONIC DISEASE 02/07/2009  . BURSITIS, RIGHT SHOULDER 10/07/2008  . CATARACT ASSOCIATED WITH OTHER SYNDROMES 08/01/2008  . DRY SKIN 08/01/2008  . CERUMEN IMPACTION, BILATERAL 03/01/2008  . CRAMP IN LIMB 01/18/2008  . DEPRESSION, SITUATIONAL, ACUTE 12/28/2007  . CONSTIPATION, DRUG INDUCED 12/28/2007  . POLYNEUROPATHY OTHER DISEASES CLASSIFIED ELSW 09/24/2007  . WEIGHT LOSS, ABNORMAL 01/07/2007  . Hyperlipidemia 09/22/2006  . Essential hypertension 09/22/2006  . GERD 09/22/2006  . OSTEOARTHRITIS 09/22/2006    Earlie Counts, PT 09/03/16 9:30 AM     [facility] [address], [place], [place], [address] Phone: [phone]   Fax:  [phone]  Name: Darin [name] MRN: [id number] Date of Birth: 1931-07-05

## 2016-09-06 ENCOUNTER — Ambulatory Visit: Payer: Medicare Other | Admitting: Physical Therapy

## 2016-09-06 DIAGNOSIS — R262 Difficulty in walking, not elsewhere classified: Secondary | ICD-10-CM

## 2016-09-06 DIAGNOSIS — R279 Unspecified lack of coordination: Secondary | ICD-10-CM | POA: Diagnosis not present

## 2016-09-06 DIAGNOSIS — M6281 Muscle weakness (generalized): Secondary | ICD-10-CM

## 2016-09-06 DIAGNOSIS — R296 Repeated falls: Secondary | ICD-10-CM

## 2016-09-06 NOTE — Therapy (Signed)
[facility] [address] [place], [place], [address] Phone: [phone]   Fax:  [phone]  Physical Therapy Treatment  Patient Details  Name: Darin [name] MRN: [id number] Date of Birth: [birth year] Referring Provider: Dr. Elease Hashimoto  Encounter Date: 09/06/2016      PT End of Session - 09/06/16 0807    Visit Number 23   Number of Visits 29   Date for PT Re-Evaluation 2016-09-15   Authorization Type G codes visit 57;  KX now (previous PT this year)   PT Start Time 0801   PT Stop Time 0843   PT Time Calculation (min) 42 min   Activity Tolerance Patient tolerated treatment well      Past Medical History:  Diagnosis Date  . Arthritis   . GERD (gastroesophageal reflux disease)   . H/O hiatal hernia    "FOR YEARS" " NO PROBLEM"  . Hyperlipidemia   . Hypertension   . Knee pain   . Neuropathy    PERIPHERAL   . Osteoporosis   . PMR (polymyalgia rheumatica) (HCC)   . Polymyalgia ([place]) 5 YEARS    Past Surgical History:  Procedure Laterality Date  . CARPAL TUNNEL RELEASE    . CATARACT EXTRACTION     right  . INGUINAL HERNIA REPAIR  10/14/2011   Procedure: HERNIA REPAIR INGUINAL ADULT;  Surgeon: [name], MD;  Location: [place];  Service: General;  Laterality: Right;  Right Inguinal Hernia Repair with Mesh  . TONSILLECTOMY    . VARICOSE VEIN SURGERY      There were no vitals filed for this visit.      Subjective Assessment - 09/06/16 0802    Subjective My right leg is giving me fits all week.     Pertinent History trip to [facility] Sep 16, 2022   Currently in Pain? Yes   Pain Score 8    Pain Location Leg   Pain Orientation Right   Pain Onset More than a month ago   Pain Frequency Constant                         [facility] Adult PT Treatment/Exercise - 09/06/16 0001      Therapeutic Activites    ADL's sit to stand, walking, standing, up and down steps     Neuro Re-ed    Neuro Re-ed Details   dynamic standing balance, narrow base of support, speed of movement     Knee/Hip Exercises: Stretches   Active Hamstring Stretch Right;Left;2 reps;30 seconds  on step   Active Hamstring Stretch Limitations on step    Other Knee/Hip Stretches stair hip flexor stretch 5x right/left with VC to tighten buttocks   Other Knee/Hip Stretches calf stretch on step 3x right/left     Knee/Hip Exercises: Aerobic   Nustep L4 10 min seat 11  Therapist present to discuss treatment and progress     Knee/Hip Exercises: Standing   Knee Flexion Limitations step taps 1 hand min support   Other Standing Knee Exercises up and down stairs  with 2 railings reciprocally 2x   Other Standing Knee Exercises weight shifting 4 ways, reaching     Knee/Hip Exercises: Seated   Long Arc Quad Right;Left;Strengthening;20 reps   Long Arc Quad Weight 5 lbs.   Clamshell with TheraBand --  gray, 30x; VC on abdominal contraction   Marching Strengthening;Right;Left;1 set;20 reps;Weights   Marching Weights 5 lbs.   Sit to Sand 20 reps;without  UE support  very high plinth                  PT Short Term Goals - 09/06/16 0821      PT SHORT TERM GOAL #1   Title be independent in initial HEP  07/09/16   Status Achieved     PT SHORT TERM GOAL #2   Status Achieved     PT SHORT TERM GOAL #3   Title The patient will have improved hip flexor muscle length to 5 degrees and HS length to 55 degrees needed for improved stride length with ambulation   Status Achieved     PT SHORT TERM GOAL #4   Title BERG balance test improved  to 38/56 indicating improved safety for standing ADLs like grooming and dresssing   Status Achieved     PT SHORT TERM GOAL #5   Title Dynamic Gait Index improved to 14 indicating improved balance with dynamic functional activities like walking and climbing stairs   Status Achieved           PT Long Term Goals - 09/06/16 9417      PT LONG TERM GOAL #1   Title be independent in  advanced HEP for further improvements in strength, ROM and balance  08/06/16   Time 6   Period Weeks   Status On-going     PT LONG TERM GOAL #2   Title Activities Specific Balance Confidence Scale improved to 82% self confidence in his steadiness with functional activities   Time 6   Period Weeks   Status On-going     PT LONG TERM GOAL #3   Title Dynamic Gait Index improved to 17 indicating improved balance with functional activities like stairs    Time 6   Period Weeks   Status On-going     PT LONG TERM GOAL #4   Title Improved hip and knee strength to at least 4/5 for greater ease with rising from a chair and  with standing > 10 min    Time 6   Period Weeks   Status On-going     PT LONG TERM GOAL #5   Title The patient will be able to ambulate 800 feet in six minutes needed for shorter distance community ambulation for traveling   Status Achieved     PT LONG TERM GOAL #6   Title BERG balance score improved to 42/56 indicating improved safety with walking and standing dynamic activities   Time 6   Period Weeks   Status On-going               Plan - 09/06/16 4081    Clinical Impression Statement The patient complains of increased stiffness and leg pain which is neither worsened or improved with exercise.    Needs sitting rest breaks between standing exercises secondary to fatigue and dizziness.  Close supervision for safety and to monitor response.  Continued to discussed community gym program upon discharge from PT.    Clinical Impairments Affecting Rehab Potential high risk of falls   PT Frequency 2x / week   PT Duration 6 weeks   PT Next Visit Plan   BERG;  ABC scale;  DGI;  Nu-Step;  LE strengthening;  gait speed and endurance;   standing balance backwards weight shifting;  narrow base of support, decreased UE support;   therapeutic activities;  KX every visit       Patient will benefit from skilled therapeutic intervention in order to  improve the following  deficits and impairments:  Decreased activity tolerance, Difficulty walking, Decreased strength, Pain, Decreased endurance, Abnormal gait, Decreased balance, Decreased coordination  Visit Diagnosis: Unspecified lack of coordination  Muscle weakness (generalized)  Repeated falls  Difficulty in walking, not elsewhere classified     Problem List Patient Active Problem List   Diagnosis Date Noted  . Leg edema, right 02/20/2015  . BPH (benign prostatic hypertrophy) 12/09/2014  . Chronic radicular low back pain 08/06/2012  . COPD (chronic obstructive pulmonary disease) ([place]) 04/21/2012  . Hyponatremia 03/29/2012  . Localized osteoarthrosis not specified whether primary or secondary, lower leg 06/20/2010  . VARICOSE VEINS LOWER EXTREMITIES W/INFLAMMATION 02/12/2010  . [place] LOCALIZED OSTEOARTHROSIS INVOLVING HAND 10/16/2009  . DUPUYTREN'S CONTRACTURE, RIGHT 07/04/2009  . DEGENERATIVE DISC DISEASE, LUMBOSACRAL SPINE W/RADICULOPATHY 06/19/2009  . CARCINOMA, BASAL CELL 02/07/2009  . ANEMIA OF CHRONIC DISEASE 02/07/2009  . BURSITIS, RIGHT SHOULDER 10/07/2008  . CATARACT ASSOCIATED WITH OTHER SYNDROMES 08/01/2008  . DRY SKIN 08/01/2008  . CERUMEN IMPACTION, BILATERAL 03/01/2008  . CRAMP IN LIMB 01/18/2008  . DEPRESSION, SITUATIONAL, ACUTE 12/28/2007  . CONSTIPATION, DRUG INDUCED 12/28/2007  . POLYNEUROPATHY OTHER DISEASES CLASSIFIED ELSW 09/24/2007  . WEIGHT LOSS, ABNORMAL 01/07/2007  . Hyperlipidemia 09/22/2006  . Essential hypertension 09/22/2006  . GERD 09/22/2006  . OSTEOARTHRITIS 09/22/2006   Ruben Im, PT 09/06/16 12:14 PM Phone: [phone] Fax: [phone]  Alvera Singh 09/06/2016, 12:13 PM  [facility] [address], [place], [place], [address] Phone: [phone]   Fax:  [phone]  Name: Darin [name] MRN: [id number] Date of Birth: 05-10-1931

## 2016-09-06 NOTE — Therapy (Signed)
[facility] [address], [place], [place], [address] Phone: [phone]   Fax:  [phone]  Physical Therapy Treatment  Patient Details  Name: Darin [name] MRN: [id number] Date of Birth: 1931-08-09 Referring Provider: Dr. Elease Hashimoto  Encounter Date: 09/06/2016      PT End of Session - 09/06/16 0807    Visit Number 23   Number of Visits 29   Date for PT Re-Evaluation 09/21/16   Authorization Type G codes visit 9;  KX now (previous PT this year)   PT Start Time 0801   PT Stop Time 0843   PT Time Calculation (min) 42 min   Activity Tolerance Patient tolerated treatment well      Past Medical History:  Diagnosis Date  . Arthritis   . GERD (gastroesophageal reflux disease)   . H/O hiatal hernia    "FOR YEARS" " NO PROBLEM"  . Hyperlipidemia   . Hypertension   . Knee pain   . Neuropathy    PERIPHERAL   . Osteoporosis   . PMR (polymyalgia rheumatica) (HCC)   . Polymyalgia ([place]) 5 YEARS    Past Surgical History:  Procedure Laterality Date  . CARPAL TUNNEL RELEASE    . CATARACT EXTRACTION     right  . INGUINAL HERNIA REPAIR  10/14/2011   Procedure: HERNIA REPAIR INGUINAL ADULT;  Surgeon: [name], MD;  Location: [place];  Service: General;  Laterality: Right;  Right Inguinal Hernia Repair with Mesh  . TONSILLECTOMY    . VARICOSE VEIN SURGERY      There were no vitals filed for this visit.      Subjective Assessment - 09/06/16 0802    Subjective My right leg is giving me fits all week.     Pertinent History trip to [facility] 2022-09-22   Currently in Pain? Yes   Pain Score 8    Pain Location Leg   Pain Orientation Right   Pain Onset More than a month ago   Pain Frequency Constant                         [facility] Adult PT Treatment/Exercise - 09/06/16 0001      Therapeutic Activites    ADL's sit to stand, walking, standing, up and down steps     Neuro Re-ed    Neuro Re-ed Details   dynamic standing balance, narrow base of support, speed of movement     Knee/Hip Exercises: Stretches   Active Hamstring Stretch Right;Left;2 reps;30 seconds  on step   Active Hamstring Stretch Limitations on step    Other Knee/Hip Stretches stair hip flexor stretch 5x right/left with VC to tighten buttocks   Other Knee/Hip Stretches calf stretch on step 3x right/left     Knee/Hip Exercises: Aerobic   Nustep L4 10 min seat 11  Therapist present to discuss treatment and progress     Knee/Hip Exercises: Standing   Knee Flexion Limitations step taps 1 hand min support   Other Standing Knee Exercises up and down stairs  with 2 railings reciprocally 2x   Other Standing Knee Exercises weight shifting 4 ways, reaching     Knee/Hip Exercises: Seated   Long Arc Quad Right;Left;Strengthening;20 reps   Long Arc Quad Weight 5 lbs.   Clamshell with [employer] --  gray, 30x; VC on abdominal contraction   Marching Strengthening;Right;Left;1 set;20 reps;Weights   Marching Weights 5 lbs.   Sit to Sand 20 reps;without  UE support  very high plinth                  PT Short Term Goals - 09/06/16 0821      PT SHORT TERM GOAL #1   Title be independent in initial HEP  07/09/16   Status Achieved     PT SHORT TERM GOAL #2   Status Achieved     PT SHORT TERM GOAL #3   Title The patient will have improved hip flexor muscle length to 5 degrees and HS length to 55 degrees needed for improved stride length with ambulation   Status Achieved     PT SHORT TERM GOAL #4   Title BERG balance test improved  to 38/56 indicating improved safety for standing ADLs like grooming and dresssing   Status Achieved     PT SHORT TERM GOAL #5   Title Dynamic Gait Index improved to 14 indicating improved balance with dynamic functional activities like walking and climbing stairs   Status Achieved           PT Long Term Goals - 09/06/16 3329      PT LONG TERM GOAL #1   Title be independent in  advanced HEP for further improvements in strength, ROM and balance  08/06/16   Time 6   Period Weeks   Status On-going     PT LONG TERM GOAL #2   Title Activities Specific Balance Confidence Scale improved to 82% self confidence in his steadiness with functional activities   Time 6   Period Weeks   Status On-going     PT LONG TERM GOAL #3   Title Dynamic Gait Index improved to 17 indicating improved balance with functional activities like stairs    Time 6   Period Weeks   Status On-going     PT LONG TERM GOAL #4   Title Improved hip and knee strength to at least 4/5 for greater ease with rising from a chair and  with standing > 10 min    Time 6   Period Weeks   Status On-going     PT LONG TERM GOAL #5   Title The patient will be able to ambulate 800 feet in six minutes needed for shorter distance community ambulation for traveling   Status Achieved     PT LONG TERM GOAL #6   Title BERG balance score improved to 42/56 indicating improved safety with walking and standing dynamic activities   Time 6   Period Weeks   Status On-going               Plan - 09/06/16 5188    Clinical Impression Statement The patient complains of increased stiffness and leg pain which is neither worsened or improved with exercise.    Needs sitting rest breaks between standing exercises secondary to fatigue and dizziness.  Close supervision for safety and to monitor response.     Clinical Impairments Affecting Rehab Potential high risk of falls   PT Frequency 2x / week   PT Duration 6 weeks   PT Next Visit Plan  Discharge next visit;  BERG;  ABC scale;  DGI;  Nu-Step;  LE strengthening;  gait speed and endurance;   standing balance backwards weight shifting;  narrow base of support, decreased UE support;   therapeutic activities;  KX every visit       Patient will benefit from skilled therapeutic intervention in order to improve the following deficits and impairments:  Decreased activity  tolerance, Difficulty walking, Decreased strength, Pain, Decreased endurance, Abnormal gait, Decreased balance, Decreased coordination  Visit Diagnosis: Unspecified lack of coordination  Muscle weakness (generalized)  Repeated falls  Difficulty in walking, not elsewhere classified     Problem List Patient Active Problem List   Diagnosis Date Noted  . Leg edema, right 02/20/2015  . BPH (benign prostatic hypertrophy) 12/09/2014  . Chronic radicular low back pain 08/06/2012  . COPD (chronic obstructive pulmonary disease) ([place]) 04/21/2012  . Hyponatremia 03/29/2012  . Localized osteoarthrosis not specified whether primary or secondary, lower leg 06/20/2010  . VARICOSE VEINS LOWER EXTREMITIES W/INFLAMMATION 02/12/2010  . [place] LOCALIZED OSTEOARTHROSIS INVOLVING HAND 10/16/2009  . DUPUYTREN'S CONTRACTURE, RIGHT 07/04/2009  . DEGENERATIVE DISC DISEASE, LUMBOSACRAL SPINE W/RADICULOPATHY 06/19/2009  . CARCINOMA, BASAL CELL 02/07/2009  . ANEMIA OF CHRONIC DISEASE 02/07/2009  . BURSITIS, RIGHT SHOULDER 10/07/2008  . CATARACT ASSOCIATED WITH OTHER SYNDROMES 08/01/2008  . DRY SKIN 08/01/2008  . CERUMEN IMPACTION, BILATERAL 03/01/2008  . CRAMP IN LIMB 01/18/2008  . DEPRESSION, SITUATIONAL, ACUTE 12/28/2007  . CONSTIPATION, DRUG INDUCED 12/28/2007  . POLYNEUROPATHY OTHER DISEASES CLASSIFIED ELSW 09/24/2007  . WEIGHT LOSS, ABNORMAL 01/07/2007  . Hyperlipidemia 09/22/2006  . Essential hypertension 09/22/2006  . GERD 09/22/2006  . OSTEOARTHRITIS 09/22/2006    Alvera Singh 09/06/2016, 12:10 PM  [facility] [address] [place], [place], [address] Phone: [phone]   Fax:  [phone]  Name: Darin [name] MRN: [id number] Date of Birth: Apr 12, 1931

## 2016-09-10 ENCOUNTER — Ambulatory Visit: Payer: Medicare Other | Admitting: Physical Therapy

## 2016-09-10 DIAGNOSIS — R296 Repeated falls: Secondary | ICD-10-CM | POA: Diagnosis not present

## 2016-09-10 DIAGNOSIS — M6281 Muscle weakness (generalized): Secondary | ICD-10-CM | POA: Diagnosis not present

## 2016-09-10 DIAGNOSIS — R279 Unspecified lack of coordination: Secondary | ICD-10-CM | POA: Diagnosis not present

## 2016-09-10 DIAGNOSIS — R262 Difficulty in walking, not elsewhere classified: Secondary | ICD-10-CM | POA: Diagnosis not present

## 2016-09-10 NOTE — Therapy (Signed)
[facility] [address] [place], [place], [address] Phone: [phone]   Fax:  [phone]  Physical Therapy Treatment/Discharge Summary  Patient Details  Name: Darin [name] MRN: [id number] Date of Birth: January 19, 1932 Referring Provider: Dr. Elease Hashimoto  Encounter Date: 09/10/2016      PT End of Session - 09/10/16 1722    Visit Number 24   Number of Visits 29   Date for PT Re-Evaluation 09/29/2016   Authorization Type G codes visit 24;  KX now (previous PT this year)   PT Start Time 0801   PT Stop Time 0845   PT Time Calculation (min) 44 min   Activity Tolerance Patient tolerated treatment well      Past Medical History:  Diagnosis Date  . Arthritis   . GERD (gastroesophageal reflux disease)   . H/O hiatal hernia    "FOR YEARS" " NO PROBLEM"  . Hyperlipidemia   . Hypertension   . Knee pain   . Neuropathy    PERIPHERAL   . Osteoporosis   . PMR (polymyalgia rheumatica) (HCC)   . Polymyalgia ([place]) 5 YEARS    Past Surgical History:  Procedure Laterality Date  . CARPAL TUNNEL RELEASE    . CATARACT EXTRACTION     right  . INGUINAL HERNIA REPAIR  10/14/2011   Procedure: HERNIA REPAIR INGUINAL ADULT;  Surgeon: [name], MD;  Location: [facility];  Service: General;  Laterality: Right;  Right Inguinal Hernia Repair with Mesh  . TONSILLECTOMY    . VARICOSE VEIN SURGERY      There were no vitals filed for this visit.      Subjective Assessment - 09/10/16 0808    Subjective My right leg is "marginally better."  "I tend to sleep on my right side and then in the morning my whole right side hurts."  I checked out [facility] [employer] and they have hours 7 days a week.     Currently in Pain? Yes   Pain Score 7    Pain Location Leg   Pain Orientation Right   Pain Type Chronic pain   Aggravating Factors  mornings   Pain Relieving Factors nothing            [facility] PT Assessment - 09/10/16 0001       Observation/Other Assessments   Activities of Balance Confidence Scale (ABC Scale)  88% balance confidence     Strength   Right Hip Flexion 4/5   Right Hip Extension 4/5   Right Hip ABduction 4-/5   Left Hip Flexion 4/5   Left Hip Extension 4-/5   Left Hip ABduction 4-/5   Right Knee Flexion 4/5   Right Knee Extension 4/5   Left Knee Flexion 4/5   Left Knee Extension 4/5   Right Ankle Dorsiflexion 4-/5   Right Ankle Eversion 4-/5   Left Ankle Dorsiflexion 4-/5   Left Ankle Plantar Flexion 4-/5     Berg Balance Test   Sit to Stand Able to stand  independently using hands   Standing Unsupported Able to stand safely 2 minutes   Sitting with Back Unsupported but Feet Supported on Floor or Stool Able to sit safely and securely 2 minutes   Stand to Sit Controls descent by using hands   Transfers Able to transfer safely, definite need of hands   Standing Unsupported with Eyes Closed Able to stand 10 seconds safely   Standing Ubsupported with Feet Together Able to place feet together  independently and stand for 1 minute with supervision   From Standing, Reach Forward with Outstretched Arm Can reach forward >12 cm safely (5")   From Standing Position, Pick up Object from [place] to pick up shoe safely and easily   From Standing Position, Turn to Look Behind Over each Shoulder Looks behind one side only/other side shows less weight shift   Turn 360 Degrees Able to turn 360 degrees safely one side only in 4 seconds or less   Standing Unsupported, Alternately Place Feet on Step/Stool Able to stand independently and complete 8 steps >20 seconds   Standing Unsupported, One Foot in Front Able to take small step independently and hold 30 seconds   Standing on One Leg Tries to lift leg/unable to hold 3 seconds but remains standing independently   Total Score 43     Dynamic Gait Index   Level Surface Normal   Change in Gait Speed Normal   Gait with Horizontal Head Turns Mild Impairment    Gait with Vertical Head Turns Mild Impairment   Gait and Pivot Turn Mild Impairment   Step Over Obstacle Normal   Step Around Obstacles Normal   Steps Mild Impairment   Total Score 20     Timed Up and Go Test   Manual TUG (seconds) 15                     [facility] Adult PT Treatment/Exercise - 09/10/16 0001      Knee/Hip Exercises: Aerobic   Nustep L4 10 min seat 11  Therapist present to discuss treatment and progress     Knee/Hip Exercises: Seated   Other Seated Knee/Hip Exercises discussion of appropriate gym programs and community options      Therapeutic activities to promote ability to stand, walk, ascend and descend stairs, rise from a chair.            PT Short Term Goals - 09/10/16 1730      PT SHORT TERM GOAL #1   Title be independent in initial HEP  07/09/16   Status Achieved     PT SHORT TERM GOAL #2   Title Activities-Specific Balance Confidence (ABC) Scale improved to 74% indicating improved confidence in his steadiness with functional activities at home and in the community   Status Achieved     PT SHORT TERM GOAL #3   Title The patient will have improved hip flexor muscle length to 5 degrees and HS length to 55 degrees needed for improved stride length with ambulation   Status Achieved     PT SHORT TERM GOAL #4   Title BERG balance test improved  to 38/56 indicating improved safety for standing ADLs like grooming and dresssing   Status Achieved           PT Long Term Goals - 09/10/16 0839      PT LONG TERM GOAL #1   Title be independent in advanced HEP for further improvements in strength, ROM and balance  08/06/16   Status Achieved     PT LONG TERM GOAL #2   Title Activities Specific Balance Confidence Scale improved to 82% self confidence in his steadiness with functional activities   Status Achieved     PT LONG TERM GOAL #3   Title Dynamic Gait Index improved to 17 indicating improved balance with functional activities like  stairs    Status Achieved     PT LONG TERM GOAL #4   Title Improved  hip and knee strength to at least 4/5 for greater ease with rising from a chair and  with standing > 10 min    Status Partially Met     PT LONG TERM GOAL #5   Title The patient will be able to ambulate 800 feet in six minutes needed for shorter distance community ambulation for traveling   Status Achieved     PT LONG TERM GOAL #6   Title BERG balance score improved to 42/56 indicating improved safety with walking and standing dynamic activities   Status Achieved               Plan - Sep 26, 2016 1723    Clinical Impression Statement The patient has made improvements in LE strength to grossly 4/5 throughout except for hip abductors and extensors 4-/5.  His Activities-Specific Balance Confidence score has improved as well to 88% self perceived confidence in his balance.  His BERG balance score has improved to 43/56 but is still at risk for falls.  We have discussed the recommendation for a cane but he does not feel this is necessary but does take a cane/walking stick when travels and will do so on his upcoming trip to [place].  His Dynamic Gait Index has improved as well to 20 which indicates a minimal risk of falls.  The patient has made many physical and functional improvements and should maintain these gains as he continues with a maintenance program at a gym.  He is currently checking into various community options.  Will discharge from PT at this time with majority of goals met.        Patient will benefit from skilled therapeutic intervention in order to improve the following deficits and impairments:     Visit Diagnosis: Unspecified lack of coordination  Muscle weakness (generalized)  Repeated falls  PHYSICAL THERAPY DISCHARGE SUMMARY  Visits from Start of Care: 24  Current functional level related to goals / functional outcomes: See clinical impressions above   Remaining deficits: As above    Education / Equipment: Comprehensive HEP Plan: Patient agrees to discharge.  Patient goals were partially met. Patient is being discharged due to meeting the stated rehab goals.  ?????          G-Codes - 09/26/16 1730    Functional Assessment Tool Used (Outpatient Only) clinical judgement   Functional Limitation Mobility: Walking and moving around   Mobility: Walking and Moving Around Current Status [phone]) At least 20 percent but less than 40 percent impaired, limited or restricted   Mobility: Walking and Moving Around Goal Status [phone]) At least 20 percent but less than 40 percent impaired, limited or restricted      Problem List Patient Active Problem List   Diagnosis Date Noted  . Leg edema, right 02/20/2015  . BPH (benign prostatic hypertrophy) 12/09/2014  . Chronic radicular low back pain 08/06/2012  . COPD (chronic obstructive pulmonary disease) ([place]) 04/21/2012  . Hyponatremia 03/29/2012  . Localized osteoarthrosis not specified whether primary or secondary, lower leg 06/20/2010  . VARICOSE VEINS LOWER EXTREMITIES W/INFLAMMATION 02/12/2010  . [place] LOCALIZED OSTEOARTHROSIS INVOLVING HAND 10/16/2009  . DUPUYTREN'S CONTRACTURE, RIGHT 07/04/2009  . DEGENERATIVE DISC DISEASE, LUMBOSACRAL SPINE W/RADICULOPATHY 06/19/2009  . CARCINOMA, BASAL CELL 02/07/2009  . ANEMIA OF CHRONIC DISEASE 02/07/2009  . BURSITIS, RIGHT SHOULDER 10/07/2008  . CATARACT ASSOCIATED WITH OTHER SYNDROMES 08/01/2008  . DRY SKIN 08/01/2008  . CERUMEN IMPACTION, BILATERAL 03/01/2008  . CRAMP IN LIMB 01/18/2008  . DEPRESSION, SITUATIONAL, ACUTE 12/28/2007  .  CONSTIPATION, DRUG INDUCED 12/28/2007  . POLYNEUROPATHY OTHER DISEASES CLASSIFIED ELSW 09/24/2007  . WEIGHT LOSS, ABNORMAL 01/07/2007  . Hyperlipidemia 09/22/2006  . Essential hypertension 09/22/2006  . GERD 09/22/2006  . OSTEOARTHRITIS 09/22/2006    Ruben Im, PT 09/10/16 5:34 PM Phone: [phone] Fax: [phone]  Alvera Singh 09/10/2016, 5:32 PM  [facility] [address] [place], [place], [address] Phone: [phone]   Fax:  [phone]  Name: Darin [name] MRN: [id number] Date of Birth: [birth year]

## 2016-09-13 ENCOUNTER — Encounter: Payer: Medicare Other | Admitting: Physical Therapy

## 2016-10-01 ENCOUNTER — Encounter: Payer: Self-pay | Admitting: Podiatry

## 2016-10-01 ENCOUNTER — Ambulatory Visit (INDEPENDENT_AMBULATORY_CARE_PROVIDER_SITE_OTHER): Payer: Medicare Other | Admitting: Podiatry

## 2016-10-01 DIAGNOSIS — M79676 Pain in unspecified toe(s): Secondary | ICD-10-CM | POA: Diagnosis not present

## 2016-10-01 DIAGNOSIS — B351 Tinea unguium: Secondary | ICD-10-CM

## 2016-10-01 NOTE — Progress Notes (Signed)
Patient ID: Darin [name], male   DOB: 06/10/1931, 83 y.o.   MRN: [id number]    Subjective: This patient presents for scheduled visit complaining of painful toenails when walking wearing shoes and requests nail debridement  Objective: Orientated 3 Bilateral peripheral pitting edema DP and PT pulses 2/4 bilaterally Capillary reflex immediate bilaterally Sensation to 10 g monofilament wire intact 3/5 r bilaterally Vibratory sensation nonreactive bilaterally Ankle reflexes reactive bilaterally Manual motor testing dorsi flexion, plantar flexion 5/5 bilaterally No open skin lesions noted bilaterally The toenails are incurvated, hypertrophic, discolored and tender to direct palpation 6-10  Assessment: Symptomatic onychomycoses 6-10 Peripheral neuropathy  Plan: Debridement toenails 10 and mechanically and electrically without any bleeding  Reappoint 3 months

## 2016-10-12 ENCOUNTER — Other Ambulatory Visit: Payer: Self-pay | Admitting: Family Medicine

## 2016-10-14 NOTE — Progress Notes (Addendum)
Subjective:   Darin [name] is a 83 y.o. male who presents for Medicare Annual/Subsequent preventive examination.  Volunteers at airport and [facility]   Review of Systems:  No ROS.  Medicare Wellness Visit. Additional risk factors are reflected in the social history.  Cardiac Risk Factors include: advanced age (>39men, >4 women);hypertension;male gender;dyslipidemia;family history of premature cardiovascular disease   Sleep patterns: Sleeps about 7 hours, feels rested. Up to void x 2.  Home Safety/Smoke Alarms: Feels safe in home. Smoke alarms in place.  Living environment; residence and Firearm Safety: Lives alone in 1 story home. 2 stepchildren in [employer]. 1 stepchild out of state. Friends that visit often.  Seat Belt Safety/Bike Helmet: Wears seat belt.   Counseling:   Eye Exam-Last exam 03/2016, yearly Dr. Martina Sinner Dental-Last exam 09/2016, every 6 months. Dr. Ashok Pall   Male:   CCS-Colonoscopy 11/24/2006, normal. [facility]    PSA-  Lab Results  Component Value Date   PSA 1.39 12/12/2009   PSA 1.41 12/28/2007       Objective:    Vitals: BP 132/60 (BP Location: Left Arm, Patient Position: Sitting, Cuff Size: Normal)   Pulse 64   Ht 6\' 1"  (1.854 m)   Wt 219 lb 4.8 oz (99.5 kg)   SpO2 96%   BMI 28.93 kg/m   Body mass index is 28.93 kg/m.  Tobacco History  Smoking Status  . Never Smoker  Smokeless Tobacco  . Never Used     Counseling given: No   Past Medical History:  Diagnosis Date  . Arthritis   . GERD (gastroesophageal reflux disease)   . H/O hiatal hernia    "FOR YEARS" " NO PROBLEM"  . Hyperlipidemia   . Hypertension   . Knee pain   . Neuropathy    PERIPHERAL   . Osteoporosis   . PMR (polymyalgia rheumatica) (HCC)   . Polymyalgia ([place]) 5 YEARS   Past Surgical History:  Procedure Laterality Date  . CARPAL TUNNEL RELEASE    . CATARACT EXTRACTION     right  . INGUINAL HERNIA REPAIR  10/14/2011   Procedure: HERNIA REPAIR  INGUINAL ADULT;  Surgeon: [name], MD;  Location: [facility];  Service: General;  Laterality: Right;  Right Inguinal Hernia Repair with Mesh  . TONSILLECTOMY    . VARICOSE VEIN SURGERY     Family History  Problem Relation Age of Onset  . Heart disease Father   . Coronary artery disease Unknown    History  Sexual Activity  . Sexual activity: Yes    Outpatient Encounter Prescriptions as of 10/15/2016  Medication Sig  . amLODipine (NORVASC) 5 MG tablet Take 1 tablet (5 mg total) by mouth daily.  Marland Kitchen aspirin 81 MG tablet Take 81 mg by mouth at bedtime.   . benazepril-hydrochlorthiazide (LOTENSIN HCT) 10-12.5 MG tablet TAKE 1 TABLET DAILY  . famotidine (PEPCID) 20 MG tablet Take 1 tablet (20 mg total) by mouth at bedtime.  . finasteride (PROSCAR) 5 MG tablet Take 1 tablet (5 mg total) by mouth daily.  Marland Kitchen gabapentin (NEURONTIN) 100 MG capsule TAKE 1 CAPSULE 4 TIMES     DAILY  . meloxicam (MOBIC) 15 MG tablet TAKE 1 TABLET DAILY  . niacin 500 MG tablet Take 500 mg by mouth 2 (two) times daily with a meal.   . polyethylene glycol (MIRALAX / GLYCOLAX) packet Take 17 g by mouth 2 (two) times daily.    . vitamin C (ASCORBIC ACID) 500 MG  tablet Take 500 mg by mouth daily.  . [DISCONTINUED] benazepril-hydrochlorthiazide (LOTENSIN HCT) 10-12.5 MG tablet TAKE 1 TABLET DAILY; PLEASEMAKE AN APPOINTMENT WITH   YOUR DOCTOR  . [DISCONTINUED] LEVITRA 20 MG tablet TAKE 1 TABLET DAILY AS NEEDED.  . [DISCONTINUED] predniSONE (DELTASONE) 10 MG tablet Taper as follows: 4-4-4-4-3-3-2-2 (Patient not taking: Reported on 10/15/2016)   No facility-administered encounter medications on file as of 10/15/2016.     Activities of Daily Living In your present state of health, do you have any difficulty performing the following activities: 10/15/2016  Hearing? N  Vision? N  Difficulty concentrating or making decisions? N  Walking or climbing stairs? Y  Dressing or bathing? N  Doing errands, shopping? N  Preparing  Food and eating ? N  Using the Toilet? N  In the past six months, have you accidently leaked urine? N  Do you have problems with loss of bowel control? N  Managing your Medications? N  Managing your Finances? N  Housekeeping or managing your Housekeeping? N  Some recent data might be hidden    Patient Care Team: [name], MD as PCP - General (Family Medicine)   Assessment:    Physical assessment deferred to PCP.  Exercise Activities and Dietary recommendations Exercise limited by: None identified Diet (meal preparation, eat out, water intake, caffeinated beverages, dairy products, fruits and vegetables): Drinks coffee, soda and tea.    Breakfast: cereal, fruit, toast, coffee Lunch: sandwich, salad Dinner: protein and vegetables     Discussed heart healthy diet and increasing activity as tolerated.   Goals      Patient Stated   . <enter goal here> (pt-stated)          Improve my ability to walk by going to the gym.       Fall Risk Fall Risk  10/15/2016 07/19/2016 06/15/2015 12/30/2014 11/16/2013  Falls in the past year? No Yes No No No  Number falls in past yr: - 1 - - -  Injury with Fall? - No - - -  Risk for fall due to : - - - - -   Depression Screen PHQ 2/9 Scores 10/15/2016 07/19/2016 06/15/2015 12/30/2014  PHQ - 2 Score 0 0 0 0    Cognitive Function       Ad8 score reviewed for issues:  Issues making decisions: no  Less interest in hobbies / activities: no  Repeats questions, stories (family complaining): no  Trouble using ordinary gadgets (microwave, computer, phone): no  Forgets the month or year: no  Mismanaging finances:  no  Remembering appts: no  Daily problems with thinking and/or memory: no Ad8 score is= 0     Immunization History  Administered Date(s) Administered  . Hep A / Hep B 06/15/2012, 07/17/2012, 12/18/2012  . Influenza Whole 03/26/2003, 01/12/2007  . Influenza, High Dose Seasonal PF 01/27/2015, 12/21/2015  .  Influenza,inj,Quad PF,36+ Mos 03/08/2014  . Pneumococcal Conjugate-13 11/16/2013  . Pneumococcal Polysaccharide-23 12/12/2009  . Td 03/26/2003  . Tdap 06/15/2015   Declines Shingrix Vaccine  Screening Tests Health Maintenance  Topic Date Due  . INFLUENZA VACCINE  10/23/2016  . TETANUS/TDAP  06/14/2025  . PNA vac Low Risk Adult  Completed      Plan:    Bring a copy of your advance directives to your next office visit.  Continue doing brain stimulating activities (puzzles, reading, adult coloring books, staying active) to keep memory sharp.   I have personally reviewed and noted the following in the  patient's chart:   . Medical and social history . Use of alcohol, tobacco or illicit drugs  . Current medications and supplements . Functional ability and status . Nutritional status . Physical activity . Advanced directives . List of other physicians . Hospitalizations, surgeries, and ER visits in previous 12 months . Vitals . Screenings to include cognitive, depression, and falls . Referrals and appointments  In addition, I have reviewed and discussed with patient certain preventive protocols, quality metrics, and best practice recommendations. A written personalized care plan for preventive services as well as general preventive health recommendations were provided to patient.     Gerilyn Nestle, RN  10/15/2016  Agree with assessment as above.  [name] MD [facility]

## 2016-10-15 ENCOUNTER — Encounter: Payer: Self-pay | Admitting: Family Medicine

## 2016-10-15 ENCOUNTER — Ambulatory Visit (INDEPENDENT_AMBULATORY_CARE_PROVIDER_SITE_OTHER): Payer: Medicare Other

## 2016-10-15 VITALS — BP 132/60 | HR 64 | Ht 73.0 in | Wt 219.3 lb

## 2016-10-15 DIAGNOSIS — Z0000 Encounter for general adult medical examination without abnormal findings: Secondary | ICD-10-CM

## 2016-10-15 NOTE — Patient Instructions (Addendum)
Bring a copy of your advance directives to your next office visit.  Continue doing brain stimulating activities (puzzles, reading, adult coloring books, staying active) to keep memory sharp.     Fall Prevention in the Home Falls can cause injuries. They can happen to people of all ages. There are many things you can do to make your home safe and to help prevent falls. What can I do on the outside of my home?  Regularly fix the edges of walkways and driveways and fix any cracks.  Remove anything that might make you trip as you walk through a door, such as a raised step or threshold.  Trim any bushes or trees on the path to your home.  Use bright outdoor lighting.  Clear any walking paths of anything that might make someone trip, such as rocks or tools.  Regularly check to see if handrails are loose or broken. Make sure that both sides of any steps have handrails.  Any raised decks and porches should have guardrails on the edges.  Have any leaves, snow, or ice cleared regularly.  Use sand or salt on walking paths during winter.  Clean up any spills in your garage right away. This includes oil or grease spills. What can I do in the bathroom?  Use night lights.  Install grab bars by the toilet and in the tub and shower. Do not use towel bars as grab bars.  Use non-skid mats or decals in the tub or shower.  If you need to sit down in the shower, use a plastic, non-slip stool.  Keep the floor dry. Clean up any water that spills on the floor as soon as it happens.  Remove soap buildup in the tub or shower regularly.  Attach bath mats securely with double-sided non-slip rug tape.  Do not have throw rugs and other things on the floor that can make you trip. What can I do in the bedroom?  Use night lights.  Make sure that you have a light by your bed that is easy to reach.  Do not use any sheets or blankets that are too big for your bed. They should not hang down onto the  floor.  Have a firm chair that has side arms. You can use this for support while you get dressed.  Do not have throw rugs and other things on the floor that can make you trip. What can I do in the kitchen?  Clean up any spills right away.  Avoid walking on wet floors.  Keep items that you use a lot in easy-to-reach places.  If you need to reach something above you, use a strong step stool that has a grab bar.  Keep electrical cords out of the way.  Do not use floor polish or wax that makes floors slippery. If you must use wax, use non-skid floor wax.  Do not have throw rugs and other things on the floor that can make you trip. What can I do with my stairs?  Do not leave any items on the stairs.  Make sure that there are handrails on both sides of the stairs and use them. Fix handrails that are broken or loose. Make sure that handrails are as long as the stairways.  Check any carpeting to make sure that it is firmly attached to the stairs. Fix any carpet that is loose or worn.  Avoid having throw rugs at the top or bottom of the stairs. If you do have throw   rugs, attach them to the floor with carpet tape.  Make sure that you have a light switch at the top of the stairs and the bottom of the stairs. If you do not have them, ask someone to add them for you. What else can I do to help prevent falls?  Wear shoes that: ? Do not have high heels. ? Have rubber bottoms. ? Are comfortable and fit you well. ? Are closed at the toe. Do not wear sandals.  If you use a stepladder: ? Make sure that it is fully opened. Do not climb a closed stepladder. ? Make sure that both sides of the stepladder are locked into place. ? Ask someone to hold it for you, if possible.  Clearly mark and make sure that you can see: ? Any grab bars or handrails. ? First and last steps. ? Where the edge of each step is.  Use tools that help you move around (mobility aids) if they are needed. These  include: ? Canes. ? Walkers. ? Scooters. ? Crutches.  Turn on the lights when you go into a dark area. Replace any light bulbs as soon as they burn out.  Set up your furniture so you have a clear path. Avoid moving your furniture around.  If any of your floors are uneven, fix them.  If there are any pets around you, be aware of where they are.  Review your medicines with your doctor. Some medicines can make you feel dizzy. This can increase your chance of falling. Ask your doctor what other things that you can do to help prevent falls. This information is not intended to replace advice given to you by your health care provider. Make sure you discuss any questions you have with your health care provider. Document Released: 01/05/2009 Document Revised: 08/17/2015 Document Reviewed: 04/15/2014 Elsevier Interactive Patient Education  2018 [employer]   Health Maintenance, Male A healthy lifestyle and preventive care is important for your health and wellness. Ask your health care provider about what schedule of regular examinations is right for you. What should I know about weight and diet? Eat a Healthy Diet  Eat plenty of vegetables, fruits, whole grains, low-fat dairy products, and lean protein.  Do not eat a lot of foods high in solid fats, added sugars, or salt.  Maintain a Healthy Weight Regular exercise can help you achieve or maintain a healthy weight. You should:  Do at least 150 minutes of exercise each week. The exercise should increase your heart rate and make you sweat (moderate-intensity exercise).  Do strength-training exercises at least twice a week.  Watch Your Levels of Cholesterol and Blood Lipids  Have your blood tested for lipids and cholesterol every 5 years starting at 81 years of age. If you are at high risk for heart disease, you should start having your blood tested when you are 81 years old. You may need to have your cholesterol levels checked more  often if: ? Your lipid or cholesterol levels are high. ? You are older than 81 years of age. ? You are at high risk for heart disease.  What should I know about cancer screening? Many types of cancers can be detected early and may often be prevented. Lung Cancer  You should be screened every year for lung cancer if: ? You are a current smoker who has smoked for at least 30 years. ? You are a former smoker who has quit within the past 15 years.  Talk   to your health care provider about your screening options, when you should start screening, and how often you should be screened.  Colorectal Cancer  Routine colorectal cancer screening usually begins at 81 years of age and should be repeated every 5-10 years until you are 81 years old. You may need to be screened more often if early forms of precancerous polyps or small growths are found. Your health care provider may recommend screening at an earlier age if you have risk factors for colon cancer.  Your health care provider may recommend using home test kits to check for hidden blood in the stool.  A small camera at the end of a tube can be used to examine your colon (sigmoidoscopy or colonoscopy). This checks for the earliest forms of colorectal cancer.  Prostate and Testicular Cancer  Depending on your age and overall health, your health care provider may do certain tests to screen for prostate and testicular cancer.  Talk to your health care provider about any symptoms or concerns you have about testicular or prostate cancer.  Skin Cancer  Check your skin from head to toe regularly.  Tell your health care provider about any new moles or changes in moles, especially if: ? There is a change in a mole's size, shape, or color. ? You have a mole that is larger than a pencil eraser.  Always use sunscreen. Apply sunscreen liberally and repeat throughout the day.  Protect yourself by wearing long sleeves, pants, a wide-brimmed hat, and  sunglasses when outside.  What should I know about heart disease, diabetes, and high blood pressure?  If you are 18-39 years of age, have your blood pressure checked every 3-5 years. If you are 40 years of age or older, have your blood pressure checked every year. You should have your blood pressure measured twice-once when you are at a hospital or clinic, and once when you are not at a hospital or clinic. Record the average of the two measurements. To check your blood pressure when you are not at a hospital or clinic, you can use: ? An automated blood pressure machine at a pharmacy. ? A home blood pressure monitor.  Talk to your health care provider about your target blood pressure.  If you are between 45-79 years old, ask your health care provider if you should take aspirin to prevent heart disease.  Have regular diabetes screenings by checking your fasting blood sugar level. ? If you are at a normal weight and have a low risk for diabetes, have this test once every three years after the age of 45. ? If you are overweight and have a high risk for diabetes, consider being tested at a younger age or more often.  A one-time screening for abdominal aortic aneurysm (AAA) by ultrasound is recommended for men aged 65-75 years who are current or former smokers. What should I know about preventing infection? Hepatitis B If you have a higher risk for hepatitis B, you should be screened for this virus. Talk with your health care provider to find out if you are at risk for hepatitis B infection. Hepatitis C Blood testing is recommended for:  Everyone born from 1945 through 1965.  Anyone with known risk factors for hepatitis C.  Sexually Transmitted Diseases (STDs)  You should be screened each year for STDs including gonorrhea and chlamydia if: ? You are sexually active and are younger than 81 years of age. ? You are older than 81 years of age and   your health care provider tells you that you are  at risk for this type of infection. ? Your sexual activity has changed since you were last screened and you are at an increased risk for chlamydia or gonorrhea. Ask your health care provider if you are at risk.  Talk with your health care provider about whether you are at high risk of being infected with HIV. Your health care provider may recommend a prescription medicine to help prevent HIV infection.  What else can I do?  Schedule regular health, dental, and eye exams.  Stay current with your vaccines (immunizations).  Do not use any tobacco products, such as cigarettes, chewing tobacco, and e-cigarettes. If you need help quitting, ask your health care provider.  Limit alcohol intake to no more than 2 drinks per day. One drink equals 12 ounces of beer, 5 ounces of wine, or 1 ounces of hard liquor.  Do not use street drugs.  Do not share needles.  Ask your health care provider for help if you need support or information about quitting drugs.  Tell your health care provider if you often feel depressed.  Tell your health care provider if you have ever been abused or do not feel safe at home. This information is not intended to replace advice given to you by your health care provider. Make sure you discuss any questions you have with your health care provider. Document Released: 09/07/2007 Document Revised: 11/08/2015 Document Reviewed: 12/13/2014 Elsevier Interactive Patient Education  2018 [employer]vier Inc.  

## 2016-11-19 ENCOUNTER — Encounter: Payer: Self-pay | Admitting: Family Medicine

## 2016-11-19 ENCOUNTER — Ambulatory Visit (INDEPENDENT_AMBULATORY_CARE_PROVIDER_SITE_OTHER): Payer: Medicare Other | Admitting: Family Medicine

## 2016-11-19 VITALS — BP 130/60 | HR 83 | Temp 97.7°F | Wt 218.0 lb

## 2016-11-19 DIAGNOSIS — I10 Essential (primary) hypertension: Secondary | ICD-10-CM | POA: Diagnosis not present

## 2016-11-19 DIAGNOSIS — M1711 Unilateral primary osteoarthritis, right knee: Secondary | ICD-10-CM | POA: Diagnosis not present

## 2016-11-19 DIAGNOSIS — L989 Disorder of the skin and subcutaneous tissue, unspecified: Secondary | ICD-10-CM | POA: Diagnosis not present

## 2016-11-19 LAB — BASIC METABOLIC PANEL
BUN: 14 mg/dL (ref 6–23)
CALCIUM: 9.7 mg/dL (ref 8.4–10.5)
CO2: 32 mEq/L (ref 19–32)
Chloride: 98 mEq/L (ref 96–112)
Creatinine, Ser: 0.72 mg/dL (ref 0.40–1.50)
GFR: 110.77 mL/min (ref >60.00)
GLUCOSE: 106 mg/dL — AB (ref 70–99)
Potassium: 4.1 mEq/L (ref 3.5–5.1)
SODIUM: 137 meq/L (ref 135–145)

## 2016-11-19 MED ORDER — METHYLPREDNISOLONE ACETATE 80 MG/ML IJ SUSP
80.0000 mg | Freq: Once | INTRAMUSCULAR | Status: AC
  Administered 2016-11-19 – 2016-11-28 (×2): 80 mg via INTRAMUSCULAR

## 2016-11-19 NOTE — Addendum Note (Signed)
Addended by: Westley Hummer B on: 11/19/2016 11:10 AM   Modules accepted: Orders

## 2016-11-19 NOTE — Patient Instructions (Signed)
Knee Injection, Care After  Refer to this sheet in the next few weeks. These instructions provide you with information about caring for yourself after your procedure. Your health care provider may also give you more specific instructions. Your treatment has been planned according to current medical practices, but problems sometimes occur. Call your health care provider if you have any problems or questions after your procedure.  What can I expect after the procedure?  After the procedure, it is common to have:   Soreness.   Warmth.   Swelling.    You may have more pain, swelling, and warmth than you did before the injection. This reaction may last for about one day.  Follow these instructions at home:  Bathing   If you were given a bandage (dressing), keep it dry until your health care provider says it can be removed. Ask your health care provider when you can start showering or taking a bath.  Managing pain, stiffness, and swelling   If directed, apply ice to the injection area:  ? Put ice in a plastic bag.  ? Place a towel between your skin and the bag.  ? Leave the ice on for 20 minutes, 2-3 times per day.   Do not apply heat to your knee.   Raise the injection area above the level of your heart while you are sitting or lying down.  Activity   Avoid strenuous activities for as long as directed by your health care provider. Ask your health care provider when you can return to your normal activities.  General instructions   Take medicines only as directed by your health care provider.   Do not take aspirin or other over-the-counter medicines unless your health care provider says you can.   Check your injection site every day for signs of infection. Watch for:  ? Redness, swelling, or pain.  ? Fluid, blood, or pus.   Follow your health care provider's instructions about dressing changes and removal.  Contact a health care provider if:   You have symptoms at your injection site that last longer than  two days after your procedure.   You have redness, swelling, or pain in your injection area.   You have fluid, blood, or pus coming from your injection site.   You have warmth in your injection area.   You have a fever.   Your pain is not controlled with medicine.  Get help right away if:   Your knee turns very red.   Your knee becomes very swollen.   Your knee pain is severe.  This information is not intended to replace advice given to you by your health care provider. Make sure you discuss any questions you have with your health care provider.  Document Released: 04/01/2014 Document Revised: 11/15/2015 Document Reviewed: 01/19/2014  Elsevier Interactive Patient Education  2018 Elsevier Inc.

## 2016-11-19 NOTE — Progress Notes (Signed)
Subjective:     Patient ID: Darin [name], male   DOB: 06/04/1931, 83 y.o.   MRN: [id number]  HPI Patient seen for medical follow-up  Hypertension which has been controlled with amlodipine and benazepril HCTZ. Compliant with therapy. No side effects. No headaches. No dizziness. No chest pains.  New problem of skin lesions on the dorsum of the right and left ears. He first noted both skin lesions about 3 weeks ago and growing fairly rapidly. He's had prior history of basal cell skin cancer but not involving the same location.  Patient has known osteoarthritis right knee. He has early morning stiffness and increased pain with ambulation especially medial compartment. Has benefited from steroid injections in the past and requesting the same. Avoids regular nonsteroidal medications because of his age. No recent fall. He has had some balance issues in the past.  Past Medical History:  Diagnosis Date  . Arthritis   . GERD (gastroesophageal reflux disease)   . H/O hiatal hernia    "FOR YEARS" " NO PROBLEM"  . Hyperlipidemia   . Hypertension   . Knee pain   . Neuropathy    PERIPHERAL   . Osteoporosis   . PMR (polymyalgia rheumatica) ([facility])   . Polymyalgia ([place]) 5 YEARS   Past Surgical History:  Procedure Laterality Date  . CARPAL TUNNEL RELEASE    . CATARACT EXTRACTION     right  . INGUINAL HERNIA REPAIR  10/14/2011   Procedure: HERNIA REPAIR INGUINAL ADULT;  Surgeon: [name], MD;  Location: [facility];  Service: General;  Laterality: Right;  Right Inguinal Hernia Repair with Mesh  . TONSILLECTOMY    . VARICOSE VEIN SURGERY      reports that he has never smoked. He has never used smokeless tobacco. He reports that he drinks about 0.6 oz of alcohol per week . He reports that he does not use drugs. family history includes Coronary artery disease in his unknown relative; Heart disease in his father. Allergies  Allergen Reactions  . Statins Other (See Comments)    Muscle pain and  upset stomach     Review of Systems  Constitutional: Negative for fatigue.  Eyes: Negative for visual disturbance.  Respiratory: Negative for cough, chest tightness and shortness of breath.   Cardiovascular: Negative for chest pain, palpitations and leg swelling.  Endocrine: Negative for polydipsia and polyuria.  Genitourinary: Negative for dysuria.  Musculoskeletal: Positive for arthralgias.  Neurological: Negative for dizziness, syncope, weakness, light-headedness and headaches.       Objective:   Physical Exam  Constitutional: He is oriented to person, place, and time. He appears well-developed and well-nourished.  HENT:  Right Ear: External ear normal.  Left Ear: External ear normal.  Mouth/Throat: Oropharynx is clear and moist.  Eyes: Pupils are equal, round, and reactive to light.  Neck: Neck supple. No thyromegaly present.  Cardiovascular: Normal rate and regular rhythm.   Pulmonary/Chest: Effort normal and breath sounds normal. No respiratory distress. He has no wheezes. He has no rales.  Musculoskeletal: He exhibits no edema.  Right knee increased crepitus with flexion and extension. Mild medial joint line tenderness. No erythema. No warmth. No ecchymosis.  Neurological: He is alert and oriented to person, place, and time.  Skin:  Patient has somewhat asymmetric fleshy skin lesion dorsum left ear and fairly symmetric well-demarcated thickened skin lesion dorsum right ear which has slightly hyperkeratotic center       Assessment:     #1 hypertension stable and at  goal  #2 primary osteoarthritis right knee  #3 skin lesions involving the right and left ear. Suspect probable basal cell carcinoma left ear and keratoacanthoma versus squamous cell carcinoma right ear    Plan:     -Continue current blood pressure medications and check basic metabolic panel today -Recommend referral to skin surgery Center regarding skin lesions as above -We discussed risk and benefits  of steroid injection right knee including risk of bleeding, bruising, and infection and patient consented. We prepped the right knee with Betadine. Using 25-gauge 1-1/2 inch needle will injected 1 mL of Depo-Medrol and 2 mL of plain Xylocaine using medial and inferior approach. Patient tolerated well -We recommended icing the right knee tonight and next couple of days -Follow-up immediately for any warmth, erythema, or other concerns -Routine follow-up in 6 months and sooner as needed  [name] MD [facility]

## 2016-11-26 ENCOUNTER — Telehealth: Payer: Self-pay | Admitting: Family Medicine

## 2016-11-26 DIAGNOSIS — L989 Disorder of the skin and subcutaneous tissue, unspecified: Secondary | ICD-10-CM

## 2016-11-26 NOTE — Telephone Encounter (Signed)
I have never heard of this previously.  He has very high likelihood of skin cancer clinically.  Let him know I will be calling that office to clarify and send this note back to me.

## 2016-11-26 NOTE — Telephone Encounter (Signed)
Pt state that the skin surgery center can not see him until he has a biopsy done of the area of concern you can call him at cell or home #[phone] (H)

## 2016-11-27 NOTE — Telephone Encounter (Signed)
Recommend we go ahead and set up referral to [facility] dermatology. They will probably have at least a couple month wait but that is okay

## 2016-11-27 NOTE — Telephone Encounter (Signed)
Pt returned your call.  

## 2016-11-27 NOTE — Telephone Encounter (Signed)
LM for patient x 1 

## 2016-11-27 NOTE — Telephone Encounter (Signed)
Pt aware that referral is being placed. Nothing further needed.

## 2016-11-27 NOTE — Telephone Encounter (Signed)
Pt aware. Will send back to Dr Elease Hashimoto per his request

## 2016-11-28 DIAGNOSIS — M1711 Unilateral primary osteoarthritis, right knee: Secondary | ICD-10-CM | POA: Diagnosis not present

## 2016-12-12 ENCOUNTER — Encounter: Payer: Self-pay | Admitting: Family Medicine

## 2016-12-31 ENCOUNTER — Encounter: Payer: Self-pay | Admitting: Podiatry

## 2016-12-31 ENCOUNTER — Ambulatory Visit (INDEPENDENT_AMBULATORY_CARE_PROVIDER_SITE_OTHER): Payer: Medicare Other | Admitting: Podiatry

## 2016-12-31 DIAGNOSIS — G609 Hereditary and idiopathic neuropathy, unspecified: Secondary | ICD-10-CM | POA: Diagnosis not present

## 2016-12-31 DIAGNOSIS — B351 Tinea unguium: Secondary | ICD-10-CM

## 2016-12-31 DIAGNOSIS — M79676 Pain in unspecified toe(s): Secondary | ICD-10-CM

## 2016-12-31 NOTE — Progress Notes (Signed)
Patient ID: Darin [name], male   DOB: [birth year], 83 y.o.   MRN: [id number]    Subjective: This patient presents for scheduled visit complaining of painful toenails when walking wearing shoes and requests nail debridement  Objective: Orientated 3 Bilateral peripheral pitting edema DP and PT pulses 2/4 bilaterally Capillary reflex immediate bilaterally Sensation to 10 g monofilament wire intact 3/5 r bilaterally Vibratory sensation nonreactive bilaterally Ankle reflexes reactive bilaterally Manual motor testing dorsi flexion, plantar flexion 5/5 bilaterally Slow flat footed gait No open skin lesions noted bilaterally Atrophic shim with absent hair growth bilaterally The toenails are incurvated, hypertrophic, discolored and tender to direct palpation 6-10  Assessment: Symptomatic onychomycoses 6-10 Idiopathic peripheral neuropathy  Plan: Debridement toenails 10 and mechanically and electrically without any bleeding  Reappoint 3 months

## 2017-01-15 ENCOUNTER — Other Ambulatory Visit: Payer: Self-pay | Admitting: Family Medicine

## 2017-02-04 ENCOUNTER — Other Ambulatory Visit: Payer: Self-pay

## 2017-02-04 DIAGNOSIS — L720 Epidermal cyst: Secondary | ICD-10-CM | POA: Diagnosis not present

## 2017-02-04 DIAGNOSIS — C44219 Basal cell carcinoma of skin of left ear and external auricular canal: Secondary | ICD-10-CM | POA: Diagnosis not present

## 2017-02-04 DIAGNOSIS — L738 Other specified follicular disorders: Secondary | ICD-10-CM | POA: Diagnosis not present

## 2017-02-04 DIAGNOSIS — L820 Inflamed seborrheic keratosis: Secondary | ICD-10-CM | POA: Diagnosis not present

## 2017-02-04 DIAGNOSIS — D485 Neoplasm of uncertain behavior of skin: Secondary | ICD-10-CM | POA: Diagnosis not present

## 2017-02-04 MED ORDER — BENAZEPRIL-HYDROCHLOROTHIAZIDE 10-12.5 MG PO TABS: 1.0000 | ORAL_TABLET | Freq: Every day | ORAL | 1 refills | Status: DC

## 2017-02-04 NOTE — Telephone Encounter (Signed)
Rx was sent in e-scrib.

## 2017-02-21 ENCOUNTER — Encounter: Payer: Self-pay | Admitting: Family Medicine

## 2017-02-21 ENCOUNTER — Ambulatory Visit (INDEPENDENT_AMBULATORY_CARE_PROVIDER_SITE_OTHER): Payer: Medicare Other | Admitting: Family Medicine

## 2017-02-21 VITALS — BP 110/58 | HR 60 | Temp 97.8°F | Wt 213.7 lb

## 2017-02-21 DIAGNOSIS — Z7189 Other specified counseling: Secondary | ICD-10-CM

## 2017-02-21 DIAGNOSIS — H6123 Impacted cerumen, bilateral: Secondary | ICD-10-CM

## 2017-02-21 DIAGNOSIS — Z7184 Encounter for health counseling related to travel: Secondary | ICD-10-CM

## 2017-02-21 MED ORDER — ATOVAQUONE-PROGUANIL HCL 250-100 MG PO TABS: ORAL_TABLET | ORAL | 0 refills | Status: DC

## 2017-02-21 MED ORDER — CIPROFLOXACIN HCL 500 MG PO TABS: ORAL_TABLET | ORAL | 0 refills | Status: DC

## 2017-02-21 NOTE — Patient Instructions (Signed)
Food Poisoning and Traveling Food poisoning is an illness caused by organisms present in something you ate or drank. Some types of food poisoning trigger symptoms quickly. Others may take 1-2 weeks for symptoms to appear. Symptoms of food poisoning include:  Diarrhea.  Cramping.  Fever.  Vomiting.  Dizziness.  Aches and pains.  Before you travel, learn as much as you can about the foodborne illnesses that are common in the areas where you are going. The risk for food poisoning varies from country to country and from one region of the world to another. Countries with a low risk include:  The [place].  [place].  [place].  [place].  [place].  Some countries in [place].  Countries with a mid-range risk include:  Countries in Eastern [place].  [place].  Some [place] islands.  Countries with a high risk include:  Countries in Asia.  Countries in the [place].  Countries in [place].  [place].  Countries in [place] and [place].  What types of illness can be passed through food and drinks? Most cases of food poisoning are caused by bacteria or viruses, such as:  E. coli.  Campylobacteriosis.  Shigellosis.  Salmonellosis.  Norovirus.  Rotavirus.  Astrovirus.  Food poisoning can also be caused by some microscopic parasites. These are organisms that live off of another larger organism. Illness caused by parasites can take 1-2 weeks to appear and may last several months. The illnesses include:  Giardiasis.  Amebiasis.  Cyclosporiasis.  Cryptosporidiosis.  Medicines are available to treat these infections. How can I decrease my risk of food poisoning while traveling? Good hand hygiene always helps protect your health. Carry small bottles of alcohol-based hand sanitizer. Use it to clean your hands before you eat. Also follow these basic guidelines for eating and drinking while traveling. Foods that are generally safe to  eat:  Food that is thoroughly cooked.  Food that is served hot.  Hard-boiled eggs.  Fruits and vegetables you wash and peel yourself.  Milk or cheese that is treated with high heat (pasteurized).  Foods to avoid:  Raw or undercooked foods.  Raw or runny eggs.  Food that is not hot (such as food that has been on a buffet or picnic table for a while).  Raw fruits or vegetables that have not been washed and peeled.  Other items made with fresh vegetables or fruits, like salad and salsa.  Milk or cheese that is not pasteurized.  Meat from local animals, such as monkeys and bats.  Drinks that are generally safe:  Bottled waters, soda, or sports drinks.  Drinks you know were sealed until you opened them.  Water you know has been treated, boiled, or filtered to remove microorganisms.  Ice from treated or bottled water.  Drinks made with boiling water, such as tea or coffee.  Pasteurized milk.  Drinks to avoid:  Water from the tap or a well.  Water from a fresh water source, such as a stream.  Ice from a tap, well, or fresh water source.  Beverages that include water from a well, tap, or fresh water source.  Milk that is not pasteurized.  Beverages from soda fountains.  What should I do if I think I have developed food poisoning while traveling?  If you have been vomiting or have diarrhea, drink water or other fluids to replace what you lost.  Take over-the-counter medicine to stop diarrhea. Consider packing a supply of antidiarrheal medicine to take with you.    Contact your health care provider if your symptoms do not clear up after a few days. How is food poisoning treated? Most cases of food poisoning go away without treatment within 48 hours. Food poisoning caused by bacteria may be treated with antibiotic medicines.Viruses cannot be treated with antibiotics.Illnesses caused by parasites might respond to antiparasitic medicines. You can also treat symptoms  of food poisoning with medicines to:  Prevent or slow diarrhea (antidiarrheals).  Rehydrate your body. Oral rehydration therapy (ORT) helps your body replace fluids and electrolytes lost in diarrhea or vomit.  Treat rashes caused by diarrhea using hydrocortisone cream.  Should I be proactively treated for food poisoning before I travel? Talk to your health care provider about your personal risk for contracting food poisoning while traveling. Discuss the foodborne illnesses that are common in the areas you plan to visit. Make sure you understand how to use any medicines your health care provider recommends. Some medicines can help prevent food poisoning. These include:  Bismuth subsalicylate (BSS). This is an ingredient in over-the-counter antidiarrheal medicines. It might help some adult travelers prevent illness.  Probiotics or "good" bacteria. Taking probiotics might help prevent some illnesses.  Antibiotics. These protect against bacteria only. Taking antibiotics to prevent food poisoning is usually suggested only for people who have a compromised immune system.  How can I learn more?  Visit a travel medicine clinic or speak with a health care provider who specializes in travel medicine as soon as you know your travel plans.  Check the Travelers' Health section on the website of the Centers for Disease Control and Prevention (CDC): http://wwwnc.cdc.gov/travel This information is not intended to replace advice given to you by your health care provider. Make sure you discuss any questions you have with your health care provider. Document Released: 06/01/2002 Document Revised: 08/08/2015 Document Reviewed: 06/18/2013 Elsevier Interactive Patient Education  2018 Elsevier Inc.  

## 2017-02-21 NOTE — Progress Notes (Signed)
Subjective:     Patient ID: Darin [name], male   DOB: Jul 17, 1931, 83 y.o.   MRN: [id number]  HPI Patient seen with chief complaint of both years feeling "clogged ". He's noted this for several days. History of cerumen impactions past. Mild hearing impairment. No dizziness. No drainage. No headaches. No ringing in the ears.  Patient has upcoming travel to [place] in January.  Tetanus up-to-date. He had previous hepatitis A vaccine as well as hepatitis B. He had oral typhoid vaccine two-year's ago. He is requesting prescription for Malarone for malaria prevention. Also requesting antibiotic for potential traveler's diarrhea.  Past Medical History:  Diagnosis Date  . Arthritis   . GERD (gastroesophageal reflux disease)   . H/O hiatal hernia    "FOR YEARS" " NO PROBLEM"  . Hyperlipidemia   . Hypertension   . Knee pain   . Neuropathy    PERIPHERAL   . Osteoporosis   . PMR (polymyalgia rheumatica) (HCC)   . Polymyalgia ([place]) 5 YEARS   Past Surgical History:  Procedure Laterality Date  . CARPAL TUNNEL RELEASE    . CATARACT EXTRACTION     right  . INGUINAL HERNIA REPAIR  10/14/2011   Procedure: HERNIA REPAIR INGUINAL ADULT;  Surgeon: [name], MD;  Location: [facility];  Service: General;  Laterality: Right;  Right Inguinal Hernia Repair with Mesh  . TONSILLECTOMY    . VARICOSE VEIN SURGERY      reports that  has never smoked. he has never used smokeless tobacco. He reports that he drinks about 0.6 oz of alcohol per week. He reports that he does not use drugs. family history includes Coronary artery disease in his unknown relative; Heart disease in his father. Allergies  Allergen Reactions  . Statins Other (See Comments)    Muscle pain and upset stomach     Review of Systems  Constitutional: Negative for chills and fever.  HENT: Negative for congestion, ear discharge and ear pain.        Objective:   Physical Exam  Constitutional: He appears well-developed and  well-nourished.  HENT:  Cerumen impaction bilaterally  Cardiovascular: Normal rate.  Pulmonary/Chest: Effort normal and breath sounds normal. No respiratory distress. He has no wheezes. He has no rales.       Assessment:     #1 bilateral cerumen impaction  #2 travel advice encounter    Plan:     -Both ears irrigated with removal of impacted cerumen -Malarone 250 mg starting 1 day prior to travel, during travel, and for 1 week after return -Cipro 500 mg twice a day for 3 days when necessary for traveler's diarrhea -Also discuss getting flu shot today  [name] MD [facility]

## 2017-03-12 ENCOUNTER — Inpatient Hospital Stay (HOSPITAL_COMMUNITY)
Admission: EM | Admit: 2017-03-12 | Discharge: 2017-03-16 | DRG: 378 | Disposition: A | Discharge status: Skilled Nursing Facility | Payer: Medicare Other | Attending: Internal Medicine | Admitting: Internal Medicine

## 2017-03-12 ENCOUNTER — Encounter (HOSPITAL_COMMUNITY): Payer: Self-pay | Admitting: Emergency Medicine

## 2017-03-12 ENCOUNTER — Other Ambulatory Visit: Payer: Self-pay

## 2017-03-12 ENCOUNTER — Inpatient Hospital Stay (HOSPITAL_COMMUNITY): Payer: Medicare Other

## 2017-03-12 DIAGNOSIS — G64 Other disorders of peripheral nervous system: Secondary | ICD-10-CM | POA: Diagnosis not present

## 2017-03-12 DIAGNOSIS — D649 Anemia, unspecified: Secondary | ICD-10-CM | POA: Diagnosis not present

## 2017-03-12 DIAGNOSIS — I440 Atrioventricular block, first degree: Secondary | ICD-10-CM | POA: Diagnosis present

## 2017-03-12 DIAGNOSIS — K219 Gastro-esophageal reflux disease without esophagitis: Secondary | ICD-10-CM | POA: Diagnosis present

## 2017-03-12 DIAGNOSIS — N400 Enlarged prostate without lower urinary tract symptoms: Secondary | ICD-10-CM | POA: Diagnosis present

## 2017-03-12 DIAGNOSIS — I10 Essential (primary) hypertension: Secondary | ICD-10-CM | POA: Diagnosis present

## 2017-03-12 DIAGNOSIS — D62 Acute posthemorrhagic anemia: Secondary | ICD-10-CM

## 2017-03-12 DIAGNOSIS — I951 Orthostatic hypotension: Secondary | ICD-10-CM | POA: Diagnosis present

## 2017-03-12 DIAGNOSIS — Z111 Encounter for screening for respiratory tuberculosis: Secondary | ICD-10-CM | POA: Diagnosis not present

## 2017-03-12 DIAGNOSIS — R58 Hemorrhage, not elsewhere classified: Secondary | ICD-10-CM | POA: Diagnosis not present

## 2017-03-12 DIAGNOSIS — K5731 Diverticulosis of large intestine without perforation or abscess with bleeding: Principal | ICD-10-CM

## 2017-03-12 DIAGNOSIS — R278 Other lack of coordination: Secondary | ICD-10-CM | POA: Diagnosis not present

## 2017-03-12 DIAGNOSIS — G629 Polyneuropathy, unspecified: Secondary | ICD-10-CM | POA: Diagnosis present

## 2017-03-12 DIAGNOSIS — Z7982 Long term (current) use of aspirin: Secondary | ICD-10-CM | POA: Diagnosis not present

## 2017-03-12 DIAGNOSIS — M353 Polymyalgia rheumatica: Secondary | ICD-10-CM | POA: Diagnosis present

## 2017-03-12 DIAGNOSIS — K5900 Constipation, unspecified: Secondary | ICD-10-CM | POA: Diagnosis not present

## 2017-03-12 DIAGNOSIS — K922 Gastrointestinal hemorrhage, unspecified: Secondary | ICD-10-CM | POA: Diagnosis not present

## 2017-03-12 DIAGNOSIS — R55 Syncope and collapse: Secondary | ICD-10-CM | POA: Diagnosis not present

## 2017-03-12 DIAGNOSIS — M810 Age-related osteoporosis without current pathological fracture: Secondary | ICD-10-CM | POA: Diagnosis present

## 2017-03-12 DIAGNOSIS — Z888 Allergy status to other drugs, medicaments and biological substances status: Secondary | ICD-10-CM | POA: Diagnosis not present

## 2017-03-12 DIAGNOSIS — Z79899 Other long term (current) drug therapy: Secondary | ICD-10-CM

## 2017-03-12 DIAGNOSIS — E876 Hypokalemia: Secondary | ICD-10-CM | POA: Diagnosis not present

## 2017-03-12 DIAGNOSIS — M6259 Muscle wasting and atrophy, not elsewhere classified, multiple sites: Secondary | ICD-10-CM | POA: Diagnosis not present

## 2017-03-12 DIAGNOSIS — R52 Pain, unspecified: Secondary | ICD-10-CM | POA: Diagnosis not present

## 2017-03-12 DIAGNOSIS — K5791 Diverticulosis of intestine, part unspecified, without perforation or abscess with bleeding: Secondary | ICD-10-CM

## 2017-03-12 DIAGNOSIS — K625 Hemorrhage of anus and rectum: Secondary | ICD-10-CM | POA: Diagnosis not present

## 2017-03-12 DIAGNOSIS — E785 Hyperlipidemia, unspecified: Secondary | ICD-10-CM | POA: Diagnosis present

## 2017-03-12 DIAGNOSIS — I351 Nonrheumatic aortic (valve) insufficiency: Secondary | ICD-10-CM | POA: Diagnosis not present

## 2017-03-12 DIAGNOSIS — K5790 Diverticulosis of intestine, part unspecified, without perforation or abscess without bleeding: Secondary | ICD-10-CM | POA: Diagnosis not present

## 2017-03-12 DIAGNOSIS — R110 Nausea: Secondary | ICD-10-CM | POA: Diagnosis present

## 2017-03-12 DIAGNOSIS — Z791 Long term (current) use of non-steroidal anti-inflammatories (NSAID): Secondary | ICD-10-CM | POA: Diagnosis not present

## 2017-03-12 DIAGNOSIS — M6281 Muscle weakness (generalized): Secondary | ICD-10-CM | POA: Diagnosis not present

## 2017-03-12 LAB — CBC
HCT: 35.2 % — ABNORMAL LOW (ref 39.0–52.0)
HCT: 33.3 % — ABNORMAL LOW (ref 39.0–52.0)
HCT: 30.4 % — ABNORMAL LOW (ref 39.0–52.0)
Hemoglobin: 11.9 g/dL — ABNORMAL LOW (ref 13.0–17.0)
Hemoglobin: 11.2 g/dL — ABNORMAL LOW (ref 13.0–17.0)
Hemoglobin: 10.3 g/dL — ABNORMAL LOW (ref 13.0–17.0)
MCH: 31.5 pg (ref 26.0–34.0)
MCH: 31.4 pg (ref 26.0–34.0)
MCH: 31.3 pg (ref 26.0–34.0)
MCHC: 33.8 g/dL (ref 30.0–36.0)
MCHC: 33.6 g/dL (ref 30.0–36.0)
MCHC: 33.9 g/dL (ref 30.0–36.0)
MCV: 93.1 fL (ref 78.0–100.0)
MCV: 93.3 fL (ref 78.0–100.0)
MCV: 92.4 fL (ref 78.0–100.0)
PLATELETS: 197 10*3/uL (ref 150–400)
PLATELETS: 203 10*3/uL (ref 150–400)
PLATELETS: 187 10*3/uL (ref 150–400)
RBC: 3.78 MIL/uL — AB (ref 4.22–5.81)
RBC: 3.57 MIL/uL — ABNORMAL LOW (ref 4.22–5.81)
RBC: 3.29 MIL/uL — ABNORMAL LOW (ref 4.22–5.81)
RDW: 13.3 % (ref 11.5–15.5)
RDW: 13.4 % (ref 11.5–15.5)
RDW: 13.4 % (ref 11.5–15.5)
WBC: 7.3 10*3/uL (ref 4.0–10.5)
WBC: 8.2 10*3/uL (ref 4.0–10.5)
WBC: 6.9 10*3/uL (ref 4.0–10.5)

## 2017-03-12 LAB — APTT: APTT: 29 s (ref 24–36)

## 2017-03-12 LAB — COMPREHENSIVE METABOLIC PANEL
ALK PHOS: 52 U/L (ref 38–126)
ALT: 15 U/L — AB (ref 17–63)
AST: 19 U/L (ref 15–41)
Albumin: 3.1 g/dL — ABNORMAL LOW (ref 3.5–5.0)
Anion gap: 6 (ref 5–15)
BILIRUBIN TOTAL: 0.6 mg/dL (ref 0.3–1.2)
BUN: 20 mg/dL (ref 6–20)
CALCIUM: 9 mg/dL (ref 8.9–10.3)
CO2: 28 mmol/L (ref 22–32)
CREATININE: 0.73 mg/dL (ref 0.61–1.24)
Chloride: 99 mmol/L — ABNORMAL LOW (ref 101–111)
GFR calc non Af Amer: >60 mL/min (ref >60)
Glucose, Bld: 118 mg/dL — ABNORMAL HIGH (ref 65–99)
Potassium: 3.9 mmol/L (ref 3.5–5.1)
SODIUM: 133 mmol/L — AB (ref 135–145)
Total Protein: 6.1 g/dL — ABNORMAL LOW (ref 6.5–8.1)

## 2017-03-12 LAB — TYPE AND SCREEN
ABO/RH(D): A POS
Antibody Screen: NEGATIVE

## 2017-03-12 LAB — ABO/RH: ABO/RH(D): A POS

## 2017-03-12 LAB — PROTIME-INR
INR: 0.98
Prothrombin Time: 12.9 seconds (ref 11.4–15.2)

## 2017-03-12 LAB — CBG MONITORING, ED: GLUCOSE-CAPILLARY: 113 mg/dL — AB (ref 65–99)

## 2017-03-12 MED ORDER — SODIUM CHLORIDE 0.9 % IV SOLN
INTRAVENOUS | Status: DC
  Administered 2017-03-12: 125 mL/h via INTRAVENOUS
  Administered 2017-03-12 – 2017-03-15 (×7): via INTRAVENOUS

## 2017-03-12 MED ORDER — ONDANSETRON HCL 4 MG PO TABS: 4.0000 mg | ORAL_TABLET | Freq: Four times a day (QID) | ORAL | Status: DC | PRN

## 2017-03-12 MED ORDER — FINASTERIDE 5 MG PO TABS
5.0000 mg | ORAL_TABLET | Freq: Every day | ORAL | Status: DC
  Administered 2017-03-13 – 2017-03-16 (×4): 5 mg via ORAL
  Filled 2017-03-12 (×5): qty 1

## 2017-03-12 MED ORDER — AMLODIPINE BESYLATE 5 MG PO TABS: 5.0000 mg | ORAL_TABLET | Freq: Every day | ORAL | Status: DC

## 2017-03-12 MED ORDER — ACETAMINOPHEN 650 MG RE SUPP: 650.0000 mg | Freq: Four times a day (QID) | RECTAL | Status: DC | PRN

## 2017-03-12 MED ORDER — AMLODIPINE BESYLATE 5 MG PO TABS
5.0000 mg | ORAL_TABLET | Freq: Every day | ORAL | Status: DC
  Administered 2017-03-13 – 2017-03-16 (×4): 5 mg via ORAL
  Filled 2017-03-12 (×4): qty 1

## 2017-03-12 MED ORDER — ACETAMINOPHEN 325 MG PO TABS: 650.0000 mg | ORAL_TABLET | Freq: Four times a day (QID) | ORAL | Status: DC | PRN

## 2017-03-12 MED ORDER — HYDRALAZINE HCL 20 MG/ML IJ SOLN: 5.0000 mg | INTRAMUSCULAR | Status: DC | PRN

## 2017-03-12 MED ORDER — SODIUM CHLORIDE 0.9 % IV SOLN
Freq: Once | INTRAVENOUS | Status: AC
  Administered 2017-03-12: 12:00:00 via INTRAVENOUS

## 2017-03-12 MED ORDER — VITAMIN C 500 MG PO TABS
500.0000 mg | ORAL_TABLET | Freq: Every day | ORAL | Status: DC
Start: 2017-03-12 — End: 2017-03-16
  Administered 2017-03-13 – 2017-03-16 (×4): 500 mg via ORAL
  Filled 2017-03-12 (×4): qty 1

## 2017-03-12 MED ORDER — ONDANSETRON HCL 4 MG/2ML IJ SOLN
4.0000 mg | Freq: Four times a day (QID) | INTRAMUSCULAR | Status: DC | PRN
Start: 2017-03-12 — End: 2017-03-16

## 2017-03-12 MED ORDER — GABAPENTIN 100 MG PO CAPS
100.0000 mg | ORAL_CAPSULE | Freq: Two times a day (BID) | ORAL | Status: DC
  Administered 2017-03-12 – 2017-03-16 (×8): 100 mg via ORAL
  Filled 2017-03-12 (×8): qty 1

## 2017-03-12 MED ORDER — POLYETHYLENE GLYCOL 3350 17 G PO PACK
17.0000 g | PACK | Freq: Every day | ORAL | Status: DC | PRN
  Administered 2017-03-15 – 2017-03-16 (×2): 17 g via ORAL
  Filled 2017-03-12 (×2): qty 1

## 2017-03-12 MED ORDER — SODIUM CHLORIDE 0.9 % IV SOLN
Freq: Once | INTRAVENOUS | Status: AC
  Administered 2017-03-12: 999 mL/h via INTRAVENOUS

## 2017-03-12 MED ORDER — ATOVAQUONE-PROGUANIL HCL 250-100 MG PO TABS: 1.0000 | ORAL_TABLET | Freq: Every day | ORAL | Status: DC

## 2017-03-12 MED ORDER — NIACIN 500 MG PO TABS
500.0000 mg | ORAL_TABLET | Freq: Two times a day (BID) | ORAL | Status: DC
  Administered 2017-03-12 – 2017-03-16 (×8): 500 mg via ORAL
  Filled 2017-03-12 (×9): qty 1

## 2017-03-12 MED ORDER — ZOLPIDEM TARTRATE 5 MG PO TABS: 5.0000 mg | ORAL_TABLET | Freq: Every evening | ORAL | Status: DC | PRN

## 2017-03-12 MED ORDER — PANTOPRAZOLE SODIUM 40 MG IV SOLR
40.0000 mg | Freq: Two times a day (BID) | INTRAVENOUS | Status: DC
  Administered 2017-03-12 – 2017-03-16 (×9): 40 mg via INTRAVENOUS
  Filled 2017-03-12 (×8): qty 40

## 2017-03-12 NOTE — ED Notes (Signed)
ED Provider at bedside. 

## 2017-03-12 NOTE — ED Notes (Signed)
This RN was called to room by Almyra Free NT. When got to room was informed while doing orthostatic VS, patient got dizzy then became unresponsive.  Patient was breathing, BP dropped 70/39.  Patient repositioned in bed by Ed staff. Dr Jeneen Rinks made aware and came to bedside.  Patient then came to.  IV fluids started, CBG checked.

## 2017-03-12 NOTE — Consult Note (Addendum)
Referring Provider: [facility]   Primary Care Physician:  [name], MD Primary Gastroenterologist:  Lucio Edward,  MD  Reason for Consultation: lower GI bleeding  ASSESSMENT AND PLAN:    87. 83 yo male with acute painless hematochezia, possibly diverticular hemorrhage. He has know diverticular disease ( last colonoscopy 2007). BUN normal, hemodynamically stable so upper bleed seems unlikely but he does takes a daily asa and Mobic on home med list.  -clear liquids okay -If bleeding doesn't resolve he may need colonoscopy -agree with PPI  2. ABL anemia. Hgb 11.9 >>11.2 at 1pm today. Baseline 13.5-14. -monitor CBC , transfuse if needed.  3. GERD, asymptomatic on daily Pepcid.    [facility] Attending    I agree with the Advanced Practitioner's note, impression and recommendations.  Given his last colonoscopy was 10 years ago - if he is willing - I would repeat a colonoscopy. Will discuss when I/we see him tomorrow   Gatha Mayer, MD, [facility]  HPI: Darin [name] is an 83 y.o. male known remotely to Dr. Fuller Plan. He has a hx of diverticular disease but no hx of diverticular hemorrhage. During the night patient had an episode of painless hematochezia. He had a couple more and then called EMS. IDespite stable vital signs n ED patient had a syncopal episode. He takes a daily ASA and Mobic on home med list. He was feeling fine when went to bed last night. Except for the dizziness / syncopal episode patient feels okay. No chest pain. No SOB. He takes Pepcid everyday for hx of GERD. He takes Miralax BID and bowel generally move fine.   Past Medical History:  Diagnosis Date  . Arthritis   . GERD (gastroesophageal reflux disease)   . H/O hiatal hernia    "FOR YEARS" " NO PROBLEM"  . Hyperlipidemia   . Hypertension   . Knee pain   . Neuropathy    PERIPHERAL   . Osteoporosis   . PMR (polymyalgia rheumatica) (HCC)   . Polymyalgia ([place]) 5 YEARS    Past Surgical  History:  Procedure Laterality Date  . CARPAL TUNNEL RELEASE    . CATARACT EXTRACTION     right  . INGUINAL HERNIA REPAIR  10/14/2011   Procedure: HERNIA REPAIR INGUINAL ADULT;  Surgeon: [name], MD;  Location: [facility];  Service: General;  Laterality: Right;  Right Inguinal Hernia Repair with Mesh  . TONSILLECTOMY    . VARICOSE VEIN SURGERY      Prior to Admission medications   Medication Sig Start Date End Date Taking? Authorizing Provider  amLODipine (NORVASC) 5 MG tablet Take 1 tablet (5 mg total) by mouth daily. 04/12/16  Yes Burchette, Alinda Sierras, MD  aspirin 81 MG tablet Take 81 mg by mouth at bedtime.    Yes [provider]  benazepril-hydrochlorthiazide (LOTENSIN HCT) 10-12.5 MG tablet Take 1 tablet daily by mouth. 02/04/17  Yes Burchette, Alinda Sierras, MD  famotidine (PEPCID) 20 MG tablet TAKE 1 TABLET AT BEDTIME 01/15/17  Yes Burchette, Alinda Sierras, MD  finasteride (PROSCAR) 5 MG tablet Take 1 tablet (5 mg total) by mouth daily. 04/26/16  Yes Burchette, Alinda Sierras, MD  gabapentin (NEURONTIN) 100 MG capsule TAKE 1 CAPSULE 4 TIMES     DAILY 09/02/16  Yes Burchette, Alinda Sierras, MD  meloxicam (MOBIC) 15 MG tablet TAKE 1 TABLET DAILY 10/14/16  Yes Burchette, Alinda Sierras, MD  niacin 500 MG tablet Take 500 mg by mouth 2 (two) times daily  with a meal.    Yes [provider]  polyethylene glycol (MIRALAX / GLYCOLAX) packet Take 17 g by mouth 2 (two) times daily.     Yes [provider]  vitamin C (ASCORBIC ACID) 500 MG tablet Take 500 mg by mouth daily.   Yes [provider]    Current Facility-Administered Medications  Medication Dose Route Frequency Provider Last Rate Last Dose  . 0.9 %  sodium chloride infusion   Intravenous Continuous Ivor Costa, MD 125 mL/hr at 03/12/17 1248 125 mL/hr at 03/12/17 1248  . acetaminophen (TYLENOL) tablet 650 mg  650 mg Oral Q6H PRN Ivor Costa, MD       Or  . acetaminophen (TYLENOL) suppository 650 mg  650 mg Rectal Q6H PRN Ivor Costa, MD      . Derrill Memo ON 03/13/2017] amLODipine (NORVASC) tablet 5 mg  5 mg Oral Daily Ivor Costa, MD      . finasteride (PROSCAR) tablet 5 mg  5 mg Oral Daily Ivor Costa, MD   Stopped at 03/12/17 1301  . gabapentin (NEURONTIN) capsule 100 mg  100 mg Oral BID Ivor Costa, MD   Stopped at 03/12/17 1139  . hydrALAZINE (APRESOLINE) injection 5 mg  5 mg Intravenous Q2H PRN Ivor Costa, MD      . niacin tablet 500 mg  500 mg Oral BID WC Ivor Costa, MD      . ondansetron [facility]) tablet 4 mg  4 mg Oral Q6H PRN Ivor Costa, MD       Or  . ondansetron (ZOFRAN) injection 4 mg  4 mg Intravenous Q6H PRN Ivor Costa, MD      . pantoprazole (PROTONIX) injection 40 mg  40 mg Intravenous Q12H Ivor Costa, MD   40 mg at 03/12/17 1257  . polyethylene glycol (MIRALAX / GLYCOLAX) packet 17 g  17 g Oral Daily PRN Ivor Costa, MD      . vitamin C (ASCORBIC ACID) tablet 500 mg  500 mg Oral Daily Ivor Costa, MD   Stopped at 03/12/17 1139  . zolpidem (AMBIEN) tablet 5 mg  5 mg Oral QHS PRN Ivor Costa, MD       Current Outpatient Medications  Medication Sig Dispense Refill  . amLODipine (NORVASC) 5 MG tablet Take 1 tablet (5 mg total) by mouth daily. 90 tablet 3  . aspirin 81 MG tablet Take 81 mg by mouth at bedtime.     . benazepril-hydrochlorthiazide (LOTENSIN HCT) 10-12.5 MG tablet Take 1 tablet daily by mouth. 90 tablet 1  . famotidine (PEPCID) 20 MG tablet TAKE 1 TABLET AT BEDTIME 90 tablet 1  . finasteride (PROSCAR) 5 MG tablet Take 1 tablet (5 mg total) by mouth daily. 90 tablet 2  . gabapentin (NEURONTIN) 100 MG capsule TAKE 1 CAPSULE 4 TIMES     DAILY 360 capsule 1  . meloxicam (MOBIC) 15 MG tablet TAKE 1 TABLET DAILY 90 tablet 2  . niacin 500 MG tablet Take 500 mg by mouth 2 (two) times daily with a meal.     . polyethylene glycol (MIRALAX / GLYCOLAX) packet Take 17 g by mouth 2 (two) times daily.      . vitamin C (ASCORBIC ACID) 500 MG tablet Take 500 mg by mouth daily.      Allergies as of 03/12/2017 -  Review Complete 03/12/2017  Allergen Reaction Noted  . Statins Other (See Comments)     Family History  Problem Relation Age of Onset  . Heart disease  Father   . Coronary artery disease Unknown     Social History   Socioeconomic History  . Marital status: Widowed    Spouse name: Not on file  . Number of children: Not on file  . Years of education: Not on file  . Highest education level: Not on file  Social Needs  . Financial resource strain: Not on file  . Food insecurity - worry: Not on file  . Food insecurity - inability: Not on file  . Transportation needs - medical: Not on file  . Transportation needs - non-medical: Not on file  Occupational History  . Occupation: retired    Fish farm manager: RETIRED  Tobacco Use  . Smoking status: Never Smoker  . Smokeless tobacco: Never Used  Substance and Sexual Activity  . Alcohol use: Yes    Alcohol/week: 1.8 oz    Types: 3 Glasses of wine per week    Comment: half glass wine before dinner  . Drug use: No  . Sexual activity: Yes  Other Topics Concern  . Not on file  Social History Narrative  . Not on file    Review of Systems: All systems reviewed and negative except where noted in HPI.  Physical Exam: Vital signs in last 24 hours: Temp:  [98.4 F (36.9 C)] 98.4 F (36.9 C) (12/19 0722) Pulse Rate:  [71-98] 75 (12/19 1400) Resp:  [10-18] 10 (12/19 1400) BP: (104-157)/(52-72) 118/55 (12/19 1400) SpO2:  [93 %-100 %] 99 % (12/19 1400) Weight:  [210 lb (95.3 kg)] 210 lb (95.3 kg) (12/19 2841)   General:   Alert, well-developed, whitie male in NAD Psych:  Pleasant, cooperative. Normal mood and affect. Eyes:  Pupils equal, sclera clear, no icterus.   Conjunctiva pink. Ears:  Normal auditory acuity. Nose:  No deformity, discharge,  or lesions. Neck:  Supple; no masses Lungs:  Clear throughout to auscultation.   No wheezes, crackles, or rhonchi.  Heart:  Regular rate and rhythm; no murmurs, no edema Abdomen:  Soft,  non-distended, nontender, BS active, no palp mass    Rectal:  Not repeated  Msk:  Symmetrical without gross deformities. . Neurologic:  Alert and  oriented x4;  grossly normal neurologically. Skin:  Intact without significant lesions or rashes..    Lab Results: Recent Labs    03/12/17 0819 03/12/17 1310  WBC 7.3 8.2  HGB 11.9* 11.2*  HCT 35.2* 33.3*  PLT 197 203   BMET Recent Labs    03/12/17 0819  NA 133*  K 3.9  CL 99*  CO2 28  GLUCOSE 118*  BUN 20  CREATININE 0.73  CALCIUM 9.0   LFT Recent Labs    03/12/17 0819  PROT 6.1*  ALBUMIN 3.1*  AST 19  ALT 15*  ALKPHOS 52  BILITOT 0.6   PT/INR Recent Labs    03/12/17 0830  LABPROT 12.9  INR 0.98    Studies/Results: Ct Head Wo Contrast  Result Date: 03/12/2017 CLINICAL DATA:  Syncope. EXAM: CT HEAD WITHOUT CONTRAST TECHNIQUE: Contiguous axial images were obtained from the base of the skull through the vertex without intravenous contrast. COMPARISON:  CT scan of March 29, 2012. FINDINGS: Brain: Mild diffuse cortical atrophy is noted. No mass effect or midline shift is noted. Ventricular size is within normal limits. There is no evidence of mass lesion, hemorrhage or acute infarction. Vascular: No hyperdense vessel or unexpected calcification. Skull: No acute abnormality is seen the calvarium. Sinuses/Orbits: No acute finding. Other: None. IMPRESSION: Mild diffuse cortical atrophy.  No acute intracranial abnormality seen. Electronically Signed   By: Marijo Conception, M.D.   On: 03/12/2017 11:29     Tye Savoy, NP-C @  03/12/2017, 3:20 PM  Pager number [phone]

## 2017-03-12 NOTE — ED Notes (Signed)
Assigned [id number] @ 15:48,call report @ 16:08

## 2017-03-12 NOTE — ED Provider Notes (Addendum)
[facility] DEPT Provider Note   CSN: [id number] Arrival date & time: 03/12/17  7169     History   Chief Complaint Chief Complaint  Patient presents with  . dark stools     HPI Darin [name] is a 83 y.o. male. CC:  Blood per rectum.   HPI:  83 year old male. No history previous episodes of GI bleeding. Had colonoscopies in the past. His last was at least 10 years ago per his report. He states he was told by his doctor he should not undergo them anymore as they had been "almost normal". He had 2 polyps on his first. His last was normal. He states he is pretty sure that he was told he has diverticuli. He also states that he "thinks" that he may have an internal hemorrhoid. It does not bleed or give him symptoms.  He waking this morning at 2:30 which is not unusual for him. He had a bowel movement which was primarily blood with some liquid stool. No abdominal pain or cramps. His been feeling well without additional symptoms. He states that he feels "a little bit weak" since this morning. He's had 2 additional episodes of blood per rectum. No formed bowel movements.  Past Medical History:  Diagnosis Date  . Arthritis   . GERD (gastroesophageal reflux disease)   . H/O hiatal hernia    "FOR YEARS" " NO PROBLEM"  . Hyperlipidemia   . Hypertension   . Knee pain   . Neuropathy    PERIPHERAL   . Osteoporosis   . PMR (polymyalgia rheumatica) (HCC)   . Polymyalgia ([place]) 5 YEARS    Patient Active Problem List   Diagnosis Date Noted  . Leg edema, right 02/20/2015  . BPH (benign prostatic hypertrophy) 12/09/2014  . Chronic radicular low back pain 08/06/2012  . COPD (chronic obstructive pulmonary disease) ([place]) 04/21/2012  . Hyponatremia 03/29/2012  . Localized osteoarthrosis not specified whether primary or secondary, lower leg 06/20/2010  . VARICOSE VEINS LOWER EXTREMITIES W/INFLAMMATION 02/12/2010  . [place] LOCALIZED OSTEOARTHROSIS INVOLVING HAND  10/16/2009  . DUPUYTREN'S CONTRACTURE, RIGHT 07/04/2009  . DEGENERATIVE DISC DISEASE, LUMBOSACRAL SPINE W/RADICULOPATHY 06/19/2009  . CARCINOMA, BASAL CELL 02/07/2009  . ANEMIA OF CHRONIC DISEASE 02/07/2009  . BURSITIS, RIGHT SHOULDER 10/07/2008  . CATARACT ASSOCIATED WITH OTHER SYNDROMES 08/01/2008  . DRY SKIN 08/01/2008  . CERUMEN IMPACTION, BILATERAL 03/01/2008  . CRAMP IN LIMB 01/18/2008  . DEPRESSION, SITUATIONAL, ACUTE 12/28/2007  . CONSTIPATION, DRUG INDUCED 12/28/2007  . POLYNEUROPATHY OTHER DISEASES CLASSIFIED ELSW 09/24/2007  . WEIGHT LOSS, ABNORMAL 01/07/2007  . Hyperlipidemia 09/22/2006  . Essential hypertension 09/22/2006  . GERD 09/22/2006  . Osteoarthritis 09/22/2006    Past Surgical History:  Procedure Laterality Date  . CARPAL TUNNEL RELEASE    . CATARACT EXTRACTION     right  . INGUINAL HERNIA REPAIR  10/14/2011   Procedure: HERNIA REPAIR INGUINAL ADULT;  Surgeon: [name], MD;  Location: [facility];  Service: General;  Laterality: Right;  Right Inguinal Hernia Repair with Mesh  . TONSILLECTOMY    . VARICOSE VEIN SURGERY         Home Medications    Prior to Admission medications   Medication Sig Start Date End Date Taking? Authorizing Provider  amLODipine (NORVASC) 5 MG tablet Take 1 tablet (5 mg total) by mouth daily. 04/12/16   Burchette, Alinda Sierras, MD  aspirin 81 MG tablet Take 81 mg by mouth at bedtime.     [provider]  atovaquone-proguanil (MALARONE) 250-100 MG TABS tablet Take one tablet daily starting one day prior to travel, during travel, and for one week after return. 02/21/17   Burchette, Alinda Sierras, MD  benazepril-hydrochlorthiazide (LOTENSIN HCT) 10-12.5 MG tablet Take 1 tablet daily by mouth. 02/04/17   Burchette, Alinda Sierras, MD  ciprofloxacin (CIPRO) 500 MG tablet One tablet twice daily for 3 days prn travelers diarrhea. 02/21/17   Burchette, Alinda Sierras, MD  famotidine (PEPCID) 20 MG tablet TAKE 1 TABLET AT BEDTIME 01/15/17   Burchette,  Alinda Sierras, MD  finasteride (PROSCAR) 5 MG tablet Take 1 tablet (5 mg total) by mouth daily. 04/26/16   Burchette, Alinda Sierras, MD  gabapentin (NEURONTIN) 100 MG capsule TAKE 1 CAPSULE 4 TIMES     DAILY 09/02/16   Burchette, Alinda Sierras, MD  meloxicam (MOBIC) 15 MG tablet TAKE 1 TABLET DAILY 10/14/16   Burchette, Alinda Sierras, MD  niacin 500 MG tablet Take 500 mg by mouth 2 (two) times daily with a meal.     [provider]  polyethylene glycol (MIRALAX / GLYCOLAX) packet Take 17 g by mouth 2 (two) times daily.      [provider]  vitamin C (ASCORBIC ACID) 500 MG tablet Take 500 mg by mouth daily.    [provider]    Family History Family History  Problem Relation Age of Onset  . Heart disease Father   . Coronary artery disease Unknown     Social History Social History   Tobacco Use  . Smoking status: Never Smoker  . Smokeless tobacco: Never Used  Substance Use Topics  . Alcohol use: Yes    Alcohol/week: 1.8 oz    Types: 3 Glasses of wine per week    Comment: half glass wine before dinner  . Drug use: No     Allergies   Statins   Review of Systems Review of Systems  Constitutional: Negative for appetite change, chills, diaphoresis, fatigue and fever.  HENT: Negative for mouth sores, sore throat and trouble swallowing.   Eyes: Negative for visual disturbance.  Respiratory: Negative for cough, chest tightness, shortness of breath and wheezing.   Cardiovascular: Negative for chest pain.  Gastrointestinal: Positive for blood in stool. Negative for abdominal distention, abdominal pain, diarrhea, nausea and vomiting.  Endocrine: Negative for polydipsia, polyphagia and polyuria.  Genitourinary: Negative for dysuria, frequency and hematuria.  Musculoskeletal: Negative for gait problem.  Skin: Negative for color change, pallor and rash.  Neurological: Negative for dizziness, syncope, light-headedness and headaches.  Hematological: Does not bruise/bleed easily.    Psychiatric/Behavioral: Negative for behavioral problems and confusion.     Physical Exam Updated Vital Signs BP (!) 157/70 (BP Location: Right Arm)   Pulse 96   Temp 98.4 F (36.9 C) (Oral)   Resp 18   Ht 6\' 1"  (1.854 m)   Wt 95.3 kg (210 lb)   SpO2 99%   BMI 27.71 kg/m   Physical Exam  Constitutional: He is oriented to person, place, and time. He appears well-developed and well-nourished. No distress.  HENT:  Head: Normocephalic.  Eyes: Conjunctivae are normal. Pupils are equal, round, and reactive to light. No scleral icterus.  Neck: Normal range of motion. Neck supple. No thyromegaly present.  Cardiovascular: Normal rate and regular rhythm. Exam reveals no gallop and no friction rub.  No murmur heard. Pulmonary/Chest: Effort normal and breath sounds normal. No respiratory distress. He has no wheezes. He has no rales.  Abdominal: Soft. Bowel sounds are normal.  He exhibits no distension. There is no tenderness. There is no rebound.  Genitourinary:  Genitourinary Comments: Blood on akin around anus. Frank, dark blood on glove. ?Palpable internal hemorrhoid  Musculoskeletal: Normal range of motion.  Neurological: He is alert and oriented to person, place, and time.  Skin: Skin is warm and dry. No rash noted.  Psychiatric: He has a normal mood and affect. His behavior is normal.     ED Treatments / Results  Labs (all labs ordered are listed, but only abnormal results are displayed) Labs Reviewed  COMPREHENSIVE METABOLIC PANEL - Abnormal; Notable for the following components:      Result Value   Sodium 133 (*)    Chloride 99 (*)    Glucose, Bld 118 (*)    Total Protein 6.1 (*)    Albumin 3.1 (*)    ALT 15 (*)    All other components within normal limits  CBC - Abnormal; Notable for the following components:   RBC 3.78 (*)    Hemoglobin 11.9 (*)    HCT 35.2 (*)    All other components within normal limits  CBG MONITORING, ED - Abnormal; Notable for the following  components:   Glucose-Capillary 113 (*)    All other components within normal limits  POC OCCULT BLOOD, ED  TYPE AND SCREEN    EKG  EKG Interpretation None       Radiology No results found.  Procedures Procedures (including critical care time)  Medications Ordered in ED Medications  0.9 %  sodium chloride infusion (999 mL/hr Intravenous New Bag/Given 03/12/17 0902)     Initial Impression / Assessment and Plan / ED Course  I have reviewed the triage vital signs and the nursing notes.  Pertinent labs & imaging results that were available during my care of the patient were reviewed by me and considered in my medical decision making (see chart for details).    08:55:  Pt had syncopal episode during orthostatic VS. Clearly vagal with HR to 34 and  SBP 78. Recovers quickly to HR of 78 and SBP of 105. Initial Hb 11.  Have placed call to Hospitalist and [facility]  Re: admisssion.    Final Clinical Impressions(s) / ED Diagnoses   Final diagnoses:  Lower GI bleed   13:10:  Discussed with provider for [facility]. They will follow the patient in consultation.    ED Discharge Orders    None       Tanna Furry, MD 03/12/17 3546    Tanna Furry, MD 03/12/17 1311

## 2017-03-12 NOTE — ED Triage Notes (Signed)
Per [facility] patient from home for having dark blood stools this morning.  Reports being on ASA. Per EMS patient was little hypertensive but did report taking his BP medications before they arrived.

## 2017-03-12 NOTE — ED Notes (Signed)
Bed: EL95 Expected date:  Expected time:  Means of arrival:  Comments: EMS/rectal bleed

## 2017-03-12 NOTE — H&P (Signed)
History and Physical    Darin [name] XLK:[id number] DOB: 1931/08/06 DOA: 03/12/2017  Referring MD/NP/PA:   PCP: [name], MD   Patient coming from:  The patient is coming from home.  At baseline, pt is independent for most of ADL.   Chief Complaint: rectal bleeding and syncope  HPI: Darin [name] is a 83 y.o. male with medical history significant of Hypertension, hyperlipidemia, GERD, PMR, BPH, diverticulosis, who presents with rectal bleeding.  Pt states that he woke up in the morning at 2:30 and had bloody stool bowel movement. The blood is dark red per pat. Since then, he has had 5 episodes of bloody bowel movement. Patient has dizziness and lightheadedness. Per report, when RN tried to do orthostatic vital sign measurements, patient passed out in ED. Patient quickly regained consciousness. Blood pressure was found to be 70/39 in ED. Pt was given IVF, his blood pressure improved to 157/70. Patient denies nausea, vomiting or abdominal pain. She does not have chest pain, shortness of breath or palpitation. No fever or chills. He does not have unilateral weakness, numbness or tingling to extremities. No vision change or hearing loss, or slurred speech Denies symptoms of UTI. Patient is to take aspirin and a morbidly Home.  Pt had colonoscopy on 01/22/00, which showed colon polyp and diverticulosis. Patient had another colonoscopy on 5/80/7 by Dr. Fuller Plan, which showed diverticulosis.   ED Course: pt was found to have Hemoglobin 11.9 which was at 12.3 on 11/07/11.Creatinine normal, temperature normal, heart rate 90s, no tachypnea, oxygen saturation 99% on room air. Patient is admitted to telemetry bed as inpatient. Dr. [facility] will be consulted by EDP.  Review of Systems:   General: no fevers, chills, no body weight gain, has fatigue HEENT: no blurry vision, hearing changes or sore throat Respiratory: no dyspnea, coughing, wheezing CV: no chest pain, no palpitations GI: no  nausea, vomiting, abdominal pain, diarrhea, constipation. Has rectal bleeding. GU: no dysuria, burning on urination, increased urinary frequency, hematuria  Ext: no leg edema Neuro: no unilateral weakness, numbness, or tingling, no vision change or hearing loss. Has dizziness and lightheadedness. Has syncope Skin: no rash, no skin tear. MSK: No muscle spasm, no deformity, no limitation of range of movement in spin Heme: No easy bruising.  Travel history: No recent long distant travel.  Allergy:  Allergies  Allergen Reactions  . Statins Other (See Comments)    Muscle pain and upset stomach    Past Medical History:  Diagnosis Date  . Arthritis   . GERD (gastroesophageal reflux disease)   . H/O hiatal hernia    "FOR YEARS" " NO PROBLEM"  . Hyperlipidemia   . Hypertension   . Knee pain   . Neuropathy    PERIPHERAL   . Osteoporosis   . PMR (polymyalgia rheumatica) (HCC)   . Polymyalgia ([place]) 5 YEARS    Past Surgical History:  Procedure Laterality Date  . CARPAL TUNNEL RELEASE    . CATARACT EXTRACTION     right  . INGUINAL HERNIA REPAIR  10/14/2011   Procedure: HERNIA REPAIR INGUINAL ADULT;  Surgeon: [name], MD;  Location: [facility];  Service: General;  Laterality: Right;  Right Inguinal Hernia Repair with Mesh  . TONSILLECTOMY    . VARICOSE VEIN SURGERY      Social History:  reports that  has never smoked. he has never used smokeless tobacco. He reports that he drinks about 1.8 oz of alcohol per week. He reports that he does  not use drugs.  Family History:  Family History  Problem Relation Age of Onset  . Heart disease Father   . Coronary artery disease Unknown      Prior to Admission medications   Medication Sig Start Date End Date Taking? Authorizing Provider  amLODipine (NORVASC) 5 MG tablet Take 1 tablet (5 mg total) by mouth daily. 04/12/16   Burchette, Alinda Sierras, MD  aspirin 81 MG tablet Take 81 mg by mouth at bedtime.     [provider]    atovaquone-proguanil (MALARONE) 250-100 MG TABS tablet Take one tablet daily starting one day prior to travel, during travel, and for one week after return. 02/21/17   Burchette, Alinda Sierras, MD  benazepril-hydrochlorthiazide (LOTENSIN HCT) 10-12.5 MG tablet Take 1 tablet daily by mouth. 02/04/17   Burchette, Alinda Sierras, MD  ciprofloxacin (CIPRO) 500 MG tablet One tablet twice daily for 3 days prn travelers diarrhea. 02/21/17   Burchette, Alinda Sierras, MD  famotidine (PEPCID) 20 MG tablet TAKE 1 TABLET AT BEDTIME 01/15/17   Burchette, Alinda Sierras, MD  finasteride (PROSCAR) 5 MG tablet Take 1 tablet (5 mg total) by mouth daily. 04/26/16   Burchette, Alinda Sierras, MD  gabapentin (NEURONTIN) 100 MG capsule TAKE 1 CAPSULE 4 TIMES     DAILY 09/02/16   Burchette, Alinda Sierras, MD  meloxicam (MOBIC) 15 MG tablet TAKE 1 TABLET DAILY 10/14/16   Burchette, Alinda Sierras, MD  niacin 500 MG tablet Take 500 mg by mouth 2 (two) times daily with a meal.     [provider]  polyethylene glycol (MIRALAX / GLYCOLAX) packet Take 17 g by mouth 2 (two) times daily.      [provider]  vitamin C (ASCORBIC ACID) 500 MG tablet Take 500 mg by mouth daily.    [provider]    Physical Exam: Vitals:   03/12/17 0730 03/12/17 0830 03/12/17 0900 03/12/17 0930  BP: (!) 141/62 (!) 115/55 (!) 120/56 (!) 104/56  Pulse: 86 79 71 71  Resp:   17 11  Temp:      TempSrc:      SpO2: 100% 95% 97% 96%  Weight:      Height:       General: Not in acute distress HEENT:       Eyes: PERRL, EOMI, no scleral icterus.       ENT: No discharge from the ears and nose, no pharynx injection, no tonsillar enlargement.        Neck: No JVD, no bruit, no mass felt. Heme: No neck lymph node enlargement. Cardiac: S1/S2, RRR, No murmurs, No gallops or rubs. Respiratory: Good air movement bilaterally. No rales, wheezing, rhonchi or rubs. GI: Soft, nondistended, nontender, no rebound pain, no organomegaly, BS present. GU: No hematuria Ext: No  pitting leg edema bilaterally. 2+DP/PT pulse bilaterally. Musculoskeletal: No joint deformities, No joint redness or warmth, no limitation of ROM in spin. Skin: No rashes.  Neuro: Alert, oriented X3, cranial nerves II-XII grossly intact, moves all extremities normally. Muscle strength 5/5 in all extremities, sensation to light touch intact. Brachial reflex 2+ bilaterally. Knee reflex 1+ bilaterally. Negative Babinski's sign. Normal finger to nose test. Psych: Patient is not psychotic, no suicidal or hemocidal ideation.  Labs on Admission: I have personally reviewed following labs and imaging studies  CBC: Recent Labs  Lab 03/12/17 0819  WBC 7.3  HGB 11.9*  HCT 35.2*  MCV 93.1  PLT 267   Basic Metabolic Panel: Recent Labs  Lab  03/12/17 0819  NA 133*  K 3.9  CL 99*  CO2 28  GLUCOSE 118*  BUN 20  CREATININE 0.73  CALCIUM 9.0   GFR: Estimated Creatinine Clearance: 79.1 mL/min (by C-G formula based on SCr of 0.73 mg/dL). Liver Function Tests: Recent Labs  Lab 03/12/17 0819  AST 19  ALT 15*  ALKPHOS 52  BILITOT 0.6  PROT 6.1*  ALBUMIN 3.1*   No results for input(s): LIPASE, AMYLASE in the last 168 hours. No results for input(s): AMMONIA in the last 168 hours. Coagulation Profile: No results for input(s): INR, PROTIME in the last 168 hours. Cardiac Enzymes: No results for input(s): CKTOTAL, CKMB, CKMBINDEX, TROPONINI in the last 168 hours. BNP (last 3 results) No results for input(s): PROBNP in the last 8760 hours. HbA1C: No results for input(s): HGBA1C in the last 72 hours. CBG: Recent Labs  Lab 03/12/17 0854  GLUCAP 113*   Lipid Profile: No results for input(s): CHOL, HDL, LDLCALC, TRIG, CHOLHDL, LDLDIRECT in the last 72 hours. Thyroid Function Tests: No results for input(s): TSH, T4TOTAL, FREET4, T3FREE, THYROIDAB in the last 72 hours. Anemia Panel: No results for input(s): VITAMINB12, FOLATE, FERRITIN, TIBC, IRON, RETICCTPCT in the last 72 hours. Urine  analysis:    Component Value Date/Time   COLORURINE YELLOW 03/29/2012 [place] 03/29/2012 1458   LABSPEC 1.019 03/29/2012 1458   PHURINE 6.0 03/29/2012 1458   GLUCOSEU NEGATIVE 03/29/2012 1458   HGBUR NEGATIVE 03/29/2012 1458   HGBUR negative 11/30/2009 1133   BILIRUBINUR NEGATIVE 03/29/2012 1458   KETONESUR 40 (A) 03/29/2012 1458   PROTEINUR NEGATIVE 03/29/2012 1458   UROBILINOGEN 0.2 03/29/2012 1458   NITRITE NEGATIVE 03/29/2012 1458   LEUKOCYTESUR NEGATIVE 03/29/2012 1458   Sepsis Labs: @LABRCNTIP (procalcitonin:4,lacticidven:4) )No results found for this or any previous visit (from the past 240 hour(s)).   Radiological Exams on Admission: No results found.   EKG:  Not done in ED, will get one. Sinus rhythm, regular, first degree AV block  Assessment/Plan Principal Problem:   GIB (gastrointestinal bleeding) Active Problems:   Essential hypertension   GERD (gastroesophageal reflux disease)   PMR (polymyalgia rheumatica) (HCC)   Syncope   BPH (benign prostatic hyperplasia)   GIB (gastrointestinal bleeding): pt has painless rectal bleeding. Given hx of Diverticulitis, history GI bleeding is most likely due to diverticular bleeding. Hemoglobin 11.9, but the patient had syncope and hypotension. Blood pressure responded to IV fluid resuscitation. Currently blood pressure is 157/70. Hemodynamically stable. [facility] will be consulted by EDP.  - will admit to tele bed as inpt. - hold ASA and moic - IVF: 2L NS, then 125 cc/h - NPO now - IVF: 2L NS bolus, then at 125 mL/hr - Start IV pantoprazole 40 bid bid - Zofran IV for nausea - Avoid NSAIDs and SQ heparin - Maintain IV access (2 large bore IVs if possible). - Monitor closely and follow q6h cbc, transfuse as necessary, fif Hgb, 7.0 - [facility]: INR, PTT and type screen  Essential hypertension: -hold Lostensin-HCTZ -continue amlodpine -IV Hydralazine when necessary  GERD (gastroesophageal reflux  disease): -On iV protonix  PMR (polymyalgia rheumatica) ([place]): pt is on malarone. LFT normal. No vision change -continue home malarone  BPH: stable - Continue Proscar  Syncope: For report, patient passed out, but quickly regained consciousness in ED. Blood pressure was 70/39.  Likely due to orthostatic status versus vasovagal syncope. EKG showed First degree AV block. -Telemetry monitor -IV fluid as above -CT head to r/o orther  intracranial abnormalities. -PT/OT after GIB is controled ( not ordered yet)   DVT ppx: SCD Code Status: Full code Family Communication: None at bed side.  Disposition Plan:  Anticipate discharge back to previous home environment Consults called: GI  Admission status:  Inpatient/tele      Date of Service 03/12/2017    Ivor Costa [facility] Pager [phone]  If 7PM-7AM, please contact night-coverage [email or web address] Password [email or web address] 03/12/2017, 10:33 AM

## 2017-03-13 ENCOUNTER — Encounter (HOSPITAL_COMMUNITY)
Admission: EM | Disposition: A | Discharge status: Skilled Nursing Facility | Payer: Self-pay | Source: Home / Self Care | Attending: Family Medicine

## 2017-03-13 DIAGNOSIS — K5731 Diverticulosis of large intestine without perforation or abscess with bleeding: Principal | ICD-10-CM

## 2017-03-13 LAB — CBC
HCT: 29.5 % — ABNORMAL LOW (ref 39.0–52.0)
HCT: 29.6 % — ABNORMAL LOW (ref 39.0–52.0)
HEMATOCRIT: 30.2 % — AB (ref 39.0–52.0)
HEMOGLOBIN: 10.2 g/dL — AB (ref 13.0–17.0)
Hemoglobin: 9.8 g/dL — ABNORMAL LOW (ref 13.0–17.0)
Hemoglobin: 9.8 g/dL — ABNORMAL LOW (ref 13.0–17.0)
MCH: 31.1 pg (ref 26.0–34.0)
MCH: 31 pg (ref 26.0–34.0)
MCH: 31.6 pg (ref 26.0–34.0)
MCHC: 33.1 g/dL (ref 30.0–36.0)
MCHC: 33.2 g/dL (ref 30.0–36.0)
MCHC: 33.8 g/dL (ref 30.0–36.0)
MCV: 93.7 fL (ref 78.0–100.0)
MCV: 93.7 fL (ref 78.0–100.0)
MCV: 93.5 fL (ref 78.0–100.0)
PLATELETS: 196 10*3/uL (ref 150–400)
Platelets: 201 10*3/uL (ref 150–400)
Platelets: 209 10*3/uL (ref 150–400)
RBC: 3.15 MIL/uL — ABNORMAL LOW (ref 4.22–5.81)
RBC: 3.16 MIL/uL — ABNORMAL LOW (ref 4.22–5.81)
RBC: 3.23 MIL/uL — ABNORMAL LOW (ref 4.22–5.81)
RDW: 13.3 % (ref 11.5–15.5)
RDW: 13.5 % (ref 11.5–15.5)
RDW: 13.6 % (ref 11.5–15.5)
WBC: 5.6 10*3/uL (ref 4.0–10.5)
WBC: 7.6 10*3/uL (ref 4.0–10.5)
WBC: 8.7 10*3/uL (ref 4.0–10.5)

## 2017-03-13 LAB — BASIC METABOLIC PANEL
ANION GAP: 5 (ref 5–15)
BUN: 14 mg/dL (ref 6–20)
CALCIUM: 8.6 mg/dL — AB (ref 8.9–10.3)
CO2: 24 mmol/L (ref 22–32)
Chloride: 107 mmol/L (ref 101–111)
Creatinine, Ser: 0.53 mg/dL — ABNORMAL LOW (ref 0.61–1.24)
GFR calc Af Amer: >60 mL/min (ref >60)
GLUCOSE: 111 mg/dL — AB (ref 65–99)
Potassium: 3.9 mmol/L (ref 3.5–5.1)
Sodium: 136 mmol/L (ref 135–145)

## 2017-03-13 LAB — GLUCOSE, CAPILLARY: GLUCOSE-CAPILLARY: 110 mg/dL — AB (ref 65–99)

## 2017-03-13 SURGERY — EGD (ESOPHAGOGASTRODUODENOSCOPY)
Anesthesia: Monitor Anesthesia Care

## 2017-03-13 NOTE — Progress Notes (Signed)
[facility]  PROGRESS NOTE  Darin [name] QQI:[id number] DOB: 04/28/1931 DOA: 03/12/2017 PCP: Darin Post, MD   Brief HPI:    83 y.o. male with medical history significant of Hypertension, hyperlipidemia, GERD, PMR, BPH, diverticulosis, who presents with rectal bleeding.   Subjective   Patient was seen by gastroenterology earlier, and he wanted to go home. Later today patient had syncopal episode while he was having a bowel movement, he passed bright red blood per rectum.   Assessment/Plan:     1. Rectal bleeding- likely diverticular bleed. Patient's hemoglobin dropped to 9.8. He had another episode of syncope with bright red blood per rectum. I called and discussed with [facility], patient will likely need colonoscopy. 2. Syncope- patient was found to have positive orthostatic hypotension. Also had bloody bowel movement. We'll continue to monitor patient's orthostatic vital signs 3. Anemia-hemoglobin this morning was 9.8, patient had another episode of bloody bowel movement. Will check stat CBC. Transfuse for hemoglobin less than 7.0. 4. Hypertension- hold Lotensin/HCTZ, continue amlodipine. 5. GERD- continue IV Protonix. 6. Polymyalgia rheumatica- meloxicam on hold 7. BPH-continue Proscar    DVT prophylaxis: SCD's  Code Status: Full code  Family Communication: No family at bedside  Disposition Plan: Likely home in 3-4 days   Consultants: Gastroenterology  Procedures:  none  Continuous infusions . sodium chloride 125 mL/hr at 03/13/17 1556      Antibiotics:   Anti-infectives (From admission, onward)   Start     Dose/Rate Route Frequency Ordered Stop   03/12/17 1000  atovaquone-proguanil (MALARONE) 250-100 MG per tablet 1 tablet  Status:  Discontinued    Comments:  Take one tablet daily starting one day prior to travel, during travel, and for one week after return.     1 tablet Oral Daily 03/12/17 0950 03/12/17 1211       Objective   Vitals:   03/13/17 1404 03/13/17 1530 03/13/17 1534 03/13/17 1545  BP: (!) 146/60 (!) 169/86 (!) 107/58 (!) 168/78  Pulse: 99     Resp: 18     Temp: 98.2 F (36.8 C)     TempSrc: Oral     SpO2: 97%     Weight:      Height:        Intake/Output Summary (Last 24 hours) at 03/13/2017 1610 Last data filed at 03/13/2017 1405 Gross per 24 hour  Intake 1890 ml  Output 2225 ml  Net -335 ml   Filed Weights   03/12/17 0728  Weight: 95.3 kg (210 lb)     Physical Examination:   Physical Exam: Eyes: No icterus, extraocular muscles intact  Mouth: Oral mucosa is moist, no lesions on palate,  Neck: Supple, no deformities, masses, or tenderness Lungs: Normal respiratory effort, bilateral clear to auscultation, no crackles or wheezes.  Heart: Regular rate and rhythm, S1 and S2 normal, no murmurs, rubs auscultated Abdomen: BS normoactive,soft,nondistended,non-tender to palpation,no organomegaly Extremities: No pretibial edema, no erythema, no cyanosis, no clubbing Neuro : Alert and oriented to time, place and person, No focal deficits Skin: No rashes seen on exam    Data Reviewed: I have personally reviewed following labs and imaging studies  CBG: Recent Labs  Lab 03/12/17 0854 03/13/17 0738  GLUCAP 113* 110*    CBC: Recent Labs  Lab 03/12/17 0819 03/12/17 1310 03/12/17 1839 03/13/17 0032 03/13/17 0630  WBC 7.3 8.2 6.9 7.6 5.6  HGB 11.9* 11.2* 10.3* 9.8* 9.8*  HCT 35.2* 33.3* 30.4* 29.6* 29.5*  MCV 93.1 93.3 92.4  93.7 93.7  PLT 197 203 187 196 397    Basic Metabolic Panel: Recent Labs  Lab 03/12/17 0819 03/13/17 0032  NA 133* 136  K 3.9 3.9  CL 99* 107  CO2 28 24  GLUCOSE 118* 111*  BUN 20 14  CREATININE 0.73 0.53*  CALCIUM 9.0 8.6*    No results found for this or any previous visit (from the past 240 hour(s)).   Liver Function Tests: Recent Labs  Lab 03/12/17 0819  AST 19  ALT 15*  ALKPHOS 52  BILITOT 0.6  PROT 6.1*  ALBUMIN 3.1*      Studies: Ct  Head Wo Contrast  Result Date: 03/12/2017 CLINICAL DATA:  Syncope. EXAM: CT HEAD WITHOUT CONTRAST TECHNIQUE: Contiguous axial images were obtained from the base of the skull through the vertex without intravenous contrast. COMPARISON:  CT scan of March 29, 2012. FINDINGS: Brain: Mild diffuse cortical atrophy is noted. No mass effect or midline shift is noted. Ventricular size is within normal limits. There is no evidence of mass lesion, hemorrhage or acute infarction. Vascular: No hyperdense vessel or unexpected calcification. Skull: No acute abnormality is seen the calvarium. Sinuses/Orbits: No acute finding. Other: None. IMPRESSION: Mild diffuse cortical atrophy. No acute intracranial abnormality seen. Electronically Signed   By: Marijo Conception, M.D.   On: 03/12/2017 11:29    Scheduled Meds: . amLODipine  5 mg Oral Daily  . finasteride  5 mg Oral Daily  . gabapentin  100 mg Oral BID  . niacin  500 mg Oral BID WC  . pantoprazole (PROTONIX) IV  40 mg Intravenous Q12H  . vitamin C  500 mg Oral Daily      Time spent: 20 min  Darin [name]   [facility] Pager [phone]. If 7PM-7AM, please contact night-coverage at [email or web address], Office  [phone]  password [email or web address]  03/13/2017, 4:10 PM  LOS: 1 day

## 2017-03-13 NOTE — Progress Notes (Addendum)
     [facility] Progress Note  Chief Complaint:    Rectal bleeding  Subjective: No further bleeding since yesterday afternoon. Hasn't ambulated but no chest pain / SOB or dizziness. Wants to go home  Objective:  Vital signs in last 24 hours: Temp:  [97.6 F (36.4 C)-98.4 F (36.9 C)] 98.4 F (36.9 C) (12/20 0512) Pulse Rate:  [71-98] 92 (12/20 0512) Resp:  [10-18] 16 (12/20 0512) BP: (104-154)/(46-111) 126/54 (12/20 0512) SpO2:  [93 %-100 %] 97 % (12/20 0512) Last BM Date: 03/12/17 General:   Alert, well-developed, white male in NAD EENT:  Normal hearing, non icteric sclera, conjunctive pink.  Heart:  Regular rate and rhythm, no lower extremity edema Pulm: Normal respiratory effor Abdomen:  Soft, nondistended, nontender.  Normal bowel sounds, no masses felt. No hepatomegaly.    Neurologic:  Alert and  oriented x4;  grossly normal neurologically. Psych:  Pleasant, cooperative.  Normal mood and affect.   Lab Results: Recent Labs    03/12/17 1839 03/13/17 0032 03/13/17 0630  WBC 6.9 7.6 5.6  HGB 10.3* 9.8* 9.8*  HCT 30.4* 29.6* 29.5*  PLT 187 196 201   BMET Recent Labs    03/12/17 0819 03/13/17 0032  NA 133* 136  K 3.9 3.9  CL 99* 107  CO2 28 24  GLUCOSE 118* 111*  BUN 20 14  CREATININE 0.73 0.53*  CALCIUM 9.0 8.6*    ASSESSMENT / PLAN:   83 yo male with acute painless hematochezia with symptomatic ABL anemia. Hgb declined 2 more grams overnight from 11.2 to  9.8. Probably diverticular hemorrhage but last colonoscopy > 10 years ago. Patient does not agree to having a colonoscopy, inpatient nor outpatient. He understands that bleeding could be secondary to a  olon neoplasm and / or a treatable condition. He understands that bleeding could recur with worsening anemia.  -wants to eat. Will give a diet. If no further bleeding then home this afternoon per his wishes -I would ambulate with assistance to make sure stable given syncopal episode in  ED -oral iron for 4-6 weeks would be helpful, caution constipation.  -advised patient to call our office or go to ED if bleeds again.      LOS: 1 day   Tye Savoy ,NP 03/13/2017, 9:28 AM  Pager number [phone]    [facility] Attending   I have taken an interval history, reviewed the chart and examined the patient. I agree with the Advanced Practitioner's note, impression and recommendations.   As above he is aware of risks of not having colonoscopy and going home today and accepts these.  My advice is low fiber diet x 1 week No ASA, meloxicam x 2 weeks Return to PCP 1-2 weeks w/ f/u Hgb He does not need to see GI in f/u based upon current scenario Start back anti-hypertensive w/in a few days after dc unless not doing well   Gatha Mayer, MD, [facility] [phone] (pager) 03/13/2017 12:43 PM

## 2017-03-13 NOTE — Evaluation (Signed)
Physical Therapy Evaluation Patient Details Name: Darin [name] MRN: [id number] DOB: 10/05/1931 Today's Date: 03/13/2017   History of Present Illness  83 yo male with acute painless hematochezia with symptomatic ABL anemia admitted  03/12/17 with black stools and syncopal episode in ED.  Clinical Impression  The patient is eager to DC home, he is alone. During mobility, monitoring BP as patient had not been OOB, the patient was noted to be orthostatic but stable. He required a BM and assisted to [facility]. After  A few minutes, the patient became unresponsive, RN was in room. Patient gradually aroused and was able to stand and return to the bed with assitance. BP results in Doc flowsheets by RN. Pt admitted with above diagnosis. Pt currently with functional limitations due to the deficits listed below (see PT Problem List).  Pt will benefit from skilled PT to increase their independence and safety with mobility to allow discharge to the venue listed below.       Follow Up Recommendations SNF    Equipment Recommendations  None recommended by PT    Recommendations for Other Services       Precautions / Restrictions Precautions Precautions: Fall Precaution Comments: orthostatic hypo, syncopal episode on eval.      Mobility  Bed Mobility Overal bed mobility: Needs Assistance Bed Mobility: Supine to Sit;Sit to Supine     Supine to sit: Min assist;HOB elevated Sit to supine: Min assist   General bed mobility comments: extra time to mobilize to sitting. After syncope, able to self assist back to bed.  Transfers Overall transfer level: Needs assistance Equipment used: Rolling walker (2 wheeled) Transfers: Sit to/from [facility] Sit to Stand: Mod assist Stand pivot transfers: Mod assist       General transfer comment: steady assist to stand for BP check. While standing, the patient reported some dizziness. After sitting down due to not getting standing BP, the patient  requested to get to [facility], still Alert. Assisted to pivot to [facility] then used RW to return to bed with stand and pivot. while on [facility] patient became unresponsive, diaphoretic, jerking for ! 2 minutes. RNS summoned to room. Patient gradually responded with jerking of body and completely awake with no knowlede of syncope/  Ambulation/Gait             General Gait Details: unable- syncope  Stairs            Wheelchair Mobility    Modified Rankin (Stroke Patients Only)       Balance Overall balance assessment: Needs assistance Sitting-balance support: No upper extremity supported;Feet supported Sitting balance-Leahy Scale: Fair     Standing balance support: Bilateral upper extremity supported;During functional activity Standing balance-Leahy Scale: Poor                               Pertinent Vitals/Pain Pain Assessment: No/denies pain    Home Living Family/patient expects to be discharged to:: Private residence Living Arrangements: Alone Available Help at Discharge: Neighbor Type of Home: House Home Access: Level entry     Home Layout: One level Home Equipment: Environmental consultant - 2 wheels;Cane - single point Additional Comments: uses 2 hiking canes    Prior Function           Comments: drives, independent     Hand Dominance        Extremity/Trunk Assessment   Upper Extremity Assessment Upper Extremity Assessment: Generalized weakness  Lower Extremity Assessment Lower Extremity Assessment: Generalized weakness    Cervical / Trunk Assessment Cervical / Trunk Assessment: Kyphotic  Communication      Cognition Arousal/Alertness: Awake/alert Behavior During Therapy: WFL for tasks assessed/performed Overall Cognitive Status: Within Functional Limits for tasks assessed                                        General Comments      Exercises     Assessment/Plan    PT Assessment Patient needs continued PT services  PT  Problem List Decreased strength;Decreased range of motion;Decreased balance;Decreased activity tolerance       PT Treatment Interventions DME instruction;Gait training;Functional mobility training;Therapeutic activities;Patient/family education    PT Goals (Current goals can be found in the Care Plan section)  Acute Rehab PT Goals Patient Stated Goal: to go home today PT Goal Formulation: With patient Time For Goal Achievement: 03/27/17 Potential to Achieve Goals: Good    Frequency Min 3X/week   Barriers to discharge        Co-evaluation               AM-PAC PT "6 Clicks" Daily Activity  Outcome Measure Difficulty turning over in bed (including adjusting bedclothes, sheets and blankets)?: A Lot Difficulty moving from lying on back to sitting on the side of the bed? : A Lot Difficulty sitting down on and standing up from a chair with arms (e.g., wheelchair, bedside commode, etc,.)?: Unable Help needed moving to and from a bed to chair (including a wheelchair)?: Total Help needed walking in hospital room?: Total Help needed climbing 3-5 steps with a railing? : Total 6 Click Score: 8    End of Session Equipment Utilized During Treatment: Gait belt Activity Tolerance: Patient tolerated treatment well Patient left: in bed;with call bell/phone within reach;with bed alarm set Nurse Communication: Mobility status PT Visit Diagnosis: Difficulty in walking, not elsewhere classified (R26.2);Dizziness and giddiness (R42)    Time: 1975-8832 PT Time Calculation (min) (ACUTE ONLY): 35 min   Charges:   PT Evaluation $PT Eval High Complexity: 1 High PT Treatments $Therapeutic Activity: 8-22 mins   PT G CodesTresa Endo PT [phone]   [name] 03/13/2017, 4:17 PM

## 2017-03-14 ENCOUNTER — Inpatient Hospital Stay (HOSPITAL_COMMUNITY): Payer: Medicare Other

## 2017-03-14 DIAGNOSIS — D62 Acute posthemorrhagic anemia: Secondary | ICD-10-CM

## 2017-03-14 DIAGNOSIS — K5731 Diverticulosis of large intestine without perforation or abscess with bleeding: Secondary | ICD-10-CM

## 2017-03-14 LAB — CBC
HCT: 29.5 % — ABNORMAL LOW (ref 39.0–52.0)
Hemoglobin: 9.9 g/dL — ABNORMAL LOW (ref 13.0–17.0)
MCH: 31.1 pg (ref 26.0–34.0)
MCHC: 33.6 g/dL (ref 30.0–36.0)
MCV: 92.8 fL (ref 78.0–100.0)
PLATELETS: 188 10*3/uL (ref 150–400)
RBC: 3.18 MIL/uL — ABNORMAL LOW (ref 4.22–5.81)
RDW: 13.5 % (ref 11.5–15.5)
WBC: 6.7 10*3/uL (ref 4.0–10.5)

## 2017-03-14 LAB — BASIC METABOLIC PANEL
Anion gap: 7 (ref 5–15)
BUN: 8 mg/dL (ref 6–20)
CO2: 24 mmol/L (ref 22–32)
CREATININE: 0.61 mg/dL (ref 0.61–1.24)
Calcium: 8.7 mg/dL — ABNORMAL LOW (ref 8.9–10.3)
Chloride: 105 mmol/L (ref 101–111)
GFR calc Af Amer: >60 mL/min (ref >60)
GLUCOSE: 111 mg/dL — AB (ref 65–99)
Potassium: 3.7 mmol/L (ref 3.5–5.1)
SODIUM: 136 mmol/L (ref 135–145)

## 2017-03-14 LAB — GLUCOSE, CAPILLARY: GLUCOSE-CAPILLARY: 101 mg/dL — AB (ref 65–99)

## 2017-03-14 NOTE — Plan of Care (Signed)
  Education: Knowledge of General Education information will improve 03/14/2017 2343 - Progressing by Bertrum Sol, RN

## 2017-03-14 NOTE — Progress Notes (Signed)
     [facility] Progress Note  Chief Complaint:    Rectal bleeding.  Subjective: Says he feels ok today. Yesterday evening he had a BM with blood ( ? Old blood). When got up from bedside commode he apparently had another syncopal episode. No further episode during the night or this am.   Objective:  Vital signs in last 24 hours: Temp:  [97.9 F (36.6 C)-98.7 F (37.1 C)] 98.7 F (37.1 C) (12/21 0458) Pulse Rate:  [88-99] 96 (12/21 0458) Resp:  [18-20] 20 (12/21 0458) BP: (107-169)/(57-86) 165/75 (12/21 0458) SpO2:  [97 %-98 %] 97 % (12/21 0458) Last BM Date: 03/13/17 General:   Alert, well-developed, white male in NAD EENT:  Normal hearing, non icteric sclera, conjunctive pink.  Heart:  Regular rate and rhythm Pulm: Normal respiratory effort Abdomen:  Soft, nondistended, nontender.  Normal bowel sounds, no masses Neurologic:  Alert and  oriented x4;  grossly normal neurologically. Psych:  Pleasant, cooperative.  Normal mood and affect.  Lab Results: Recent Labs    03/13/17 0630 03/13/17 1557 03/14/17 0426  WBC 5.6 8.7 6.7  HGB 9.8* 10.2* 9.9*  HCT 29.5* 30.2* 29.5*  PLT 201 209 188     ASSESSMENT / PLAN:    83 yo male with painless hematochezia. Suspect diverticular hemorrhage. Passed blood last night ( ? Old blood). Interestingly he had a second syncopal episode when getting up from commode. Hgb really didn't drop much, if any, so probably was old blood but not sure why the syncopal episode. He hasn't been hypotensive. Head CT scan on admission was normal.  -Still declines colonoscopy. He plans on discharge this afternoon if steady on feet.  -our discharge recommendations outlined on yesterday's note remain the same.    LOS: 2 days   Tye Savoy ,NP 03/14/2017, 9:30 AM  Pager number [phone]    [facility] Attending   I have taken an interval history, reviewed the chart and examined the patient. I agree with the Advanced Practitioner's  note, impression and recommendations.   Seems stable w/o further bleeding or syncope.  Echocardiogram tomorrow.  No new recommendations.  He will need close f/u of Hgb w/ PCP - no GI f/u to be scheduled - can be done by PCP if appropriate  If he changes his mind about colonoscopy or something else happens let us know - signing off  Gatha Mayer, MD, [facility] [phone] (pager) 03/14/2017 4:03 PM

## 2017-03-14 NOTE — Progress Notes (Signed)
[facility]  PROGRESS NOTE  Darin [name] WCB:[id number] DOB: Aug 17, 1931 DOA: 03/12/2017 PCP: [name], MD   Brief HPI:    83 y.o. male with medical history significant of Hypertension, hyperlipidemia, GERD, PMR, BPH, diverticulosis, who presents with rectal bleeding.   Subjective   Patient seen and examined, had syncopal episode yesterday while checking orthostatic vital signs. Today blood pressure has improved.     Assessment/Plan:     1. Rectal bleeding- likely diverticular bleed. Patient's hemoglobin dropped to 9.8. He had another episode of syncope with bright red blood per rectum. Patient has refused to get colonoscopy. 2. Syncope- patient was found to have positive orthostatic hypotension. Also had bloody bowel movement.Patient has positive orthostatic hypotension. Will check echocardiogram. place TED hose. 3. Anemia-hemoglobin this morning is 9.9 Will check  CBC in am. Transfuse for hemoglobin less than 7.0. 4.  Hypertension - Discontinue Lotensin/HCTZ, continue amlodipine. 5. GERD- continue IV Protonix. 6. Polymyalgia rheumatica- meloxicam on hold 7. BPH-continue Proscar    DVT prophylaxis: SCD's  Code Status: Full code  Family Communication: No family at bedside  Disposition Plan: Likely home in am, if echo is normal    Consultants: Gastroenterology  Procedures:  none  Continuous infusions . sodium chloride 125 mL/hr at 03/14/17 0850      Antibiotics:   Anti-infectives (From admission, onward)   Start     Dose/Rate Route Frequency Ordered Stop   03/12/17 1000  atovaquone-proguanil (MALARONE) 250-100 MG per tablet 1 tablet  Status:  Discontinued    Comments:  Take one tablet daily starting one day prior to travel, during travel, and for one week after return.     1 tablet Oral Daily 03/12/17 0950 03/12/17 1211       Objective   Vitals:   03/13/17 1545 03/13/17 2018 03/14/17 0458 03/14/17 1300  BP: (!) 168/78 (!) 136/57 (!)  165/75   Pulse:  88 96   Resp:  18 20   Temp:  97.9 F (36.6 C) 98.7 F (37.1 C) 98.7 F (37.1 C)  TempSrc:  Oral Oral Oral  SpO2:  98% 97% 98%  Weight:      Height:        Intake/Output Summary (Last 24 hours) at 03/14/2017 1443 Last data filed at 03/14/2017 1238 Gross per 24 hour  Intake 3220 ml  Output 1525 ml  Net 1695 ml   Filed Weights   03/12/17 0728  Weight: 95.3 kg (210 lb)     Physical Examination:   Physical Exam: Eyes: No icterus, extraocular muscles intact  Mouth: Oral mucosa is moist, no lesions on palate,  Neck: Supple, no deformities, masses, or tenderness Lungs: Normal respiratory effort, bilateral clear to auscultation, no crackles or wheezes.  Heart: Regular rate and rhythm, S1 and S2 normal, no murmurs, rubs auscultated Abdomen: BS normoactive,soft,nondistended,non-tender to palpation,no organomegaly Extremities: No pretibial edema, no erythema, no cyanosis, no clubbing Neuro : Alert and oriented to time, place and person, No focal deficits Skin: No rashes seen on exam   Data Reviewed: I have personally reviewed following labs and imaging studies  CBG: Recent Labs  Lab 03/12/17 0854 03/13/17 0738 03/14/17 0746  GLUCAP 113* 110* 101*    CBC: Recent Labs  Lab 03/12/17 1839 03/13/17 0032 03/13/17 0630 03/13/17 1557 03/14/17 0426  WBC 6.9 7.6 5.6 8.7 6.7  HGB 10.3* 9.8* 9.8* 10.2* 9.9*  HCT 30.4* 29.6* 29.5* 30.2* 29.5*  MCV 92.4 93.7 93.7 93.5 92.8  PLT 187 196 201 209  962    Basic Metabolic Panel: Recent Labs  Lab 03/12/17 0819 03/13/17 0032 03/14/17 0426  NA 133* 136 136  K 3.9 3.9 3.7  CL 99* 107 105  CO2 28 24 24   GLUCOSE 118* 111* 111*  BUN 20 14 8   CREATININE 0.73 0.53* 0.61  CALCIUM 9.0 8.6* 8.7*    No results found for this or any previous visit (from the past 240 hour(s)).   Liver Function Tests: Recent Labs  Lab 03/12/17 0819  AST 19  ALT 15*  ALKPHOS 52  BILITOT 0.6  PROT 6.1*  ALBUMIN 3.1*       Studies: No results found.  Scheduled Meds: . amLODipine  5 mg Oral Daily  . finasteride  5 mg Oral Daily  . gabapentin  100 mg Oral BID  . niacin  500 mg Oral BID WC  . pantoprazole (PROTONIX) IV  40 mg Intravenous Q12H  . vitamin C  500 mg Oral Daily      Time spent: 20 min  [name]   [facility] Pager [phone]. If 7PM-7AM, please contact night-coverage at [email or web address], Office  [phone]  password [email or web address]  03/14/2017, 2:43 PM  LOS: 2 days

## 2017-03-14 NOTE — Progress Notes (Signed)
PT Cancellation Note  Patient Details Name: Renaldo Gornick MRN: [id number] DOB: [birth year]   Cancelled Treatment:    Reason Eval/Treat Not Completed: Patient at procedure or test/unavailablegetting Echo. Had orthostatics done by nursing. Continue PT.    [name] 03/14/2017, 4:10 PM [name] PT [phone]

## 2017-03-15 ENCOUNTER — Inpatient Hospital Stay (HOSPITAL_COMMUNITY): Payer: Medicare Other

## 2017-03-15 DIAGNOSIS — I351 Nonrheumatic aortic (valve) insufficiency: Secondary | ICD-10-CM

## 2017-03-15 LAB — BASIC METABOLIC PANEL
ANION GAP: 6 (ref 5–15)
BUN: 5 mg/dL — ABNORMAL LOW (ref 6–20)
CALCIUM: 8.9 mg/dL (ref 8.9–10.3)
CHLORIDE: 104 mmol/L (ref 101–111)
CO2: 27 mmol/L (ref 22–32)
Creatinine, Ser: 0.54 mg/dL — ABNORMAL LOW (ref 0.61–1.24)
GFR calc non Af Amer: >60 mL/min (ref >60)
GLUCOSE: 114 mg/dL — AB (ref 65–99)
POTASSIUM: 4.1 mmol/L (ref 3.5–5.1)
Sodium: 137 mmol/L (ref 135–145)

## 2017-03-15 LAB — CBC
HEMATOCRIT: 30.1 % — AB (ref 39.0–52.0)
HEMOGLOBIN: 10 g/dL — AB (ref 13.0–17.0)
MCH: 31 pg (ref 26.0–34.0)
MCHC: 33.2 g/dL (ref 30.0–36.0)
MCV: 93.2 fL (ref 78.0–100.0)
Platelets: 204 10*3/uL (ref 150–400)
RBC: 3.23 MIL/uL — AB (ref 4.22–5.81)
RDW: 13.5 % (ref 11.5–15.5)
WBC: 7.2 10*3/uL (ref 4.0–10.5)

## 2017-03-15 LAB — GLUCOSE, CAPILLARY: Glucose-Capillary: 106 mg/dL — ABNORMAL HIGH (ref 65–99)

## 2017-03-15 LAB — ECHOCARDIOGRAM COMPLETE
Height: 73 in
Weight: 3360 oz

## 2017-03-15 NOTE — Clinical Social Work Note (Signed)
Clinical Social Work Assessment  Patient Details  Name: Darin [name] MRN: [id number] Date of Birth: [birth year]  Date of referral:  03/15/17               Reason for consult:  Facility Placement                Permission sought to share information with:  Facility Art therapist granted to share information::  Yes, Verbal Permission Granted  Name::        Agency::  SNFs  Relationship::     Contact Information:     Housing/Transportation Living arrangements for the past 2 months:  Single Family Home Source of Information:  Patient Patient Interpreter Needed:  None Criminal Activity/Legal Involvement Pertinent to Current Situation/Hospitalization:  No - Comment as needed Significant Relationships:  Adult Children Lives with:  Self Do you feel safe going back to the place where you live?  Yes Need for family participation in patient care:  No (Coment)  Care giving concerns:  No care giving concerns identified.    Social Worker assessment / plan:  CSW met with pt at bedside to address consult for new SNF. CSW introduced self and explained role of social work. P/T is recommending STR at SNF. CSW provided SNF listing for review. Pt agreeable to SNF for STR. Prefers [facility] (1st) or [facility] (2nd). CSW sent FL-2 to SNFs and will follow up on potential bed offers. CSW will continue to follow.   Employment status:  Retired Forensic scientist:  Medicare PT Recommendations:  [place] / Referral to community resources:  [place]  Patient/Family's Response to care: Pt's was appreciative of CSW support.   Patient/Family's Understanding of and Emotional Response to Diagnosis, Current Treatment, and Prognosis:  Pt is aware of his medical state and understands he will benefit from rehab prior to returning home.   Emotional Assessment Appearance:  Appears stated age Attitude/Demeanor/Rapport:  Other(Appropriate) Affect  (typically observed):  Accepting Orientation:  Oriented to Self, Oriented to Situation, Oriented to Place, Oriented to  Time Alcohol / Substance use:  Other Psych involvement (Current and /or in the community):  No (Comment)  Discharge Needs  Concerns to be addressed:  Care Coordination, Discharge Planning Concerns Readmission within the last 30 days:  No Current discharge risk:  Dependent with Mobility Barriers to Discharge:  Continued Medical Work up   Truitt Merle, LCSW 03/15/2017, 4:47 PM

## 2017-03-15 NOTE — Progress Notes (Signed)
Patient ID: Darin [name], male   DOB: Aug 01, 1931, 83 y.o.   MRN: [id number]  PROGRESS NOTE    Darin [name]  WUJ:[id number] DOB: 1931-11-05 DOA: 03/12/2017 PCP: [name], MD   Brief Narrative: 83 year old male with history of hypertension, hyperlipidemia, polymyalgia rheumatica and GERD, BPH, diverticulosis presented with rectal bleeding.  GI has evaluated the patient.  Patient refused colonoscopy.   Assessment & Plan:   Principal Problem:   GIB (gastrointestinal bleeding) Active Problems:   Essential hypertension   GERD (gastroesophageal reflux disease)   PMR (polymyalgia rheumatica) (HCC)   Syncope   BPH (benign prostatic hyperplasia)   Diverticulosis of colon with hemorrhage   Acute blood loss anemia    1. Rectal bleeding- likely diverticular bleed.  Hemoglobin 10 today.  GI follow-up appreciated; GI has signed off.  Patient refused colonoscopy.  Repeat a.m. Hemoglobin.  Discontinue IV Protonix 2. Syncope- probably secondary to orthostatic hypotension.  2D echo pending 3. Anemia-hemoglobin this morning is 10. Will check  CBC in am. Transfuse for hemoglobin less than 7.0. 4.  Hypertension - continue amlodipine.  Monitor blood pressure 5. Polymyalgia rheumatica- meloxicam on hold 6. BPH-continue Proscar 7. Generalized deconditioning-PT recommending SNF placement.  Consult Education officer, museum   DVT prophylaxis: SCDs Code Status: Full Family Communication: None at bedside Disposition Plan: Probable nursing home once bed is available  Consultants: GI  Procedures: None  Antimicrobials: None   Subjective: Patient seen and examined at bedside.  He feels weak but denies overnight fever, nausea or vomiting.  Objective: Vitals:   03/14/17 1300 03/14/17 2216 03/15/17 0602 03/15/17 1428  BP:  133/61 (!) 149/59 (!) 146/58  Pulse:  78 78 85  Resp:  16 18 14   Temp: 98.7 F (37.1 C) 98.5 F (36.9 C) 98.1 F (36.7 C) 98.1 F (36.7 C)  TempSrc: Oral Oral Oral Oral    SpO2: 98%   99%  Weight:      Height:        Intake/Output Summary (Last 24 hours) at 03/15/2017 1525 Last data filed at 03/15/2017 1438 Gross per 24 hour  Intake 2980 ml  Output 2125 ml  Net 855 ml   Filed Weights   03/12/17 0728  Weight: 95.3 kg (210 lb)    Examination:  General exam: Appears calm and comfortable  Respiratory system: Bilateral decreased breath sound at bases Cardiovascular system: S1 & S2 heard, controlled  gastrointestinal system: Abdomen is nondistended, soft and nontender. Normal bowel sounds heard. Extremities: No cyanosis, clubbing, edema   Data Reviewed: I have personally reviewed following labs and imaging studies  CBC: Recent Labs  Lab 03/13/17 0032 03/13/17 0630 03/13/17 1557 03/14/17 0426 03/15/17 0514  WBC 7.6 5.6 8.7 6.7 7.2  HGB 9.8* 9.8* 10.2* 9.9* 10.0*  HCT 29.6* 29.5* 30.2* 29.5* 30.1*  MCV 93.7 93.7 93.5 92.8 93.2  PLT 196 201 209 188 956   Basic Metabolic Panel: Recent Labs  Lab 03/12/17 0819 03/13/17 0032 03/14/17 0426 03/15/17 0514  NA 133* 136 136 137  K 3.9 3.9 3.7 4.1  CL 99* 107 105 104  CO2 28 24 24 27   GLUCOSE 118* 111* 111* 114*  BUN 20 14 8  5*  CREATININE 0.73 0.53* 0.61 0.54*  CALCIUM 9.0 8.6* 8.7* 8.9   GFR: Estimated Creatinine Clearance: 79.1 mL/min (A) (by C-G formula based on SCr of 0.54 mg/dL (L)). Liver Function Tests: Recent Labs  Lab 03/12/17 0819  AST 19  ALT 15*  ALKPHOS 52  BILITOT 0.6  PROT 6.1*  ALBUMIN 3.1*   No results for input(s): LIPASE, AMYLASE in the last 168 hours. No results for input(s): AMMONIA in the last 168 hours. Coagulation Profile: Recent Labs  Lab 03/12/17 0830  INR 0.98   Cardiac Enzymes: No results for input(s): CKTOTAL, CKMB, CKMBINDEX, TROPONINI in the last 168 hours. BNP (last 3 results) No results for input(s): PROBNP in the last 8760 hours. HbA1C: No results for input(s): HGBA1C in the last 72 hours. CBG: Recent Labs  Lab 03/12/17 0854  03/13/17 0738 03/14/17 0746 03/15/17 0747  GLUCAP 113* 110* 101* 106*   Lipid Profile: No results for input(s): CHOL, HDL, LDLCALC, TRIG, CHOLHDL, LDLDIRECT in the last 72 hours. Thyroid Function Tests: No results for input(s): TSH, T4TOTAL, FREET4, T3FREE, THYROIDAB in the last 72 hours. Anemia Panel: No results for input(s): VITAMINB12, FOLATE, FERRITIN, TIBC, IRON, RETICCTPCT in the last 72 hours. Sepsis Labs: No results for input(s): PROCALCITON, LATICACIDVEN in the last 168 hours.  No results found for this or any previous visit (from the past 240 hour(s)).       Radiology Studies: No results found.      Scheduled Meds: . amLODipine  5 mg Oral Daily  . finasteride  5 mg Oral Daily  . gabapentin  100 mg Oral BID  . niacin  500 mg Oral BID WC  . pantoprazole (PROTONIX) IV  40 mg Intravenous Q12H  . vitamin C  500 mg Oral Daily   Continuous Infusions: . sodium chloride 125 mL/hr at 03/15/17 0600     LOS: 3 days        Aline August, MD [facility] Pager [phone]  If 7PM-7AM, please contact night-coverage [email or web address] Password [facility] 03/15/2017, 3:25 PM

## 2017-03-15 NOTE — Progress Notes (Signed)
Called MSW to update about plans for discharge to SNF.  Barbee Shro[place]. Brigitte Pulse, RN

## 2017-03-15 NOTE — Plan of Care (Signed)
  Safety: Ability to remain free from injury will improve 03/15/2017 2328 - Completed/Met by Mickie Kay, RN

## 2017-03-15 NOTE — NC FL2 (Signed)
[place] MEDICAID FL2 LEVEL OF CARE SCREENING TOOL     IDENTIFICATION  Patient Name: Darin [name] Birthdate: [birth year] Sex: male Admission Date (Current Location): 03/12/2017  [facility] and [place] Number:  Herbalist and Address:  [facility],  [facility] [address] [place]      Provider Number: [id number]  Attending Physician Name and Address:  Aline August, MD  Relative Name and Phone Number:       Current Level of Care: Hospital Recommended Level of Care: [place] Prior Approval Number:    Date Approved/Denied:   PASRR Number: [id number]  Discharge Plan: SNF    Current Diagnoses: Patient Active Problem List   Diagnosis Date Noted  . Diverticulosis of colon with hemorrhage   . Acute blood loss anemia   . GIB (gastrointestinal bleeding) 03/12/2017  . Leg edema, right 02/20/2015  . BPH (benign prostatic hyperplasia) 12/09/2014  . Chronic radicular low back pain 08/06/2012  . COPD (chronic obstructive pulmonary disease) ([place]) 04/21/2012  . Syncope 03/29/2012  . Hyponatremia 03/29/2012  . Localized osteoarthrosis not specified whether primary or secondary, lower leg 06/20/2010  . VARICOSE VEINS LOWER EXTREMITIES W/INFLAMMATION 02/12/2010  . [place] LOCALIZED OSTEOARTHROSIS INVOLVING HAND 10/16/2009  . DUPUYTREN'S CONTRACTURE, RIGHT 07/04/2009  . DEGENERATIVE DISC DISEASE, LUMBOSACRAL SPINE W/RADICULOPATHY 06/19/2009  . CARCINOMA, BASAL CELL 02/07/2009  . ANEMIA OF CHRONIC DISEASE 02/07/2009  . BURSITIS, RIGHT SHOULDER 10/07/2008  . CATARACT ASSOCIATED WITH OTHER SYNDROMES 08/01/2008  . DRY SKIN 08/01/2008  . CERUMEN IMPACTION, BILATERAL 03/01/2008  . PMR (polymyalgia rheumatica) ([place]) 01/18/2008  . CRAMP IN LIMB 01/18/2008  . DEPRESSION, SITUATIONAL, ACUTE 12/28/2007  . CONSTIPATION, DRUG INDUCED 12/28/2007  . POLYNEUROPATHY OTHER DISEASES CLASSIFIED ELSW 09/24/2007  . WEIGHT LOSS, ABNORMAL 01/07/2007  .  Hyperlipidemia 09/22/2006  . Essential hypertension 09/22/2006  . GERD (gastroesophageal reflux disease) 09/22/2006  . Osteoarthritis 09/22/2006    Orientation RESPIRATION BLADDER Height & Weight     Self, Time, Situation, Place  Normal Continent Weight: 210 lb (95.3 kg) Height:  6\' 1"  (185.4 cm)  BEHAVIORAL SYMPTOMS/MOOD NEUROLOGICAL BOWEL NUTRITION STATUS      Continent Diet(Regular diet)  AMBULATORY STATUS COMMUNICATION OF NEEDS Skin   Limited Assist Verbally Normal                       Personal Care Assistance Level of Assistance  Bathing, Feeding, Dressing Bathing Assistance: Limited assistance Feeding assistance: Independent Dressing Assistance: Limited assistance     Functional Limitations Info  Sight, Hearing, Speech Sight Info: Adequate Hearing Info: Adequate Speech Info: Adequate    SPECIAL CARE FACTORS FREQUENCY  PT (By licensed PT)     PT Frequency: 5x              Contractures Contractures Info: Not present    Additional Factors Info  Code Status, Allergies Code Status Info: Full Allergies Info: Statins           Current Medications (03/15/2017):  This is the current hospital active medication list Current Facility-Administered Medications  Medication Dose Route Frequency Provider Last Rate Last Dose  . acetaminophen (TYLENOL) tablet 650 mg  650 mg Oral Q6H PRN Ivor Costa, MD       Or  . acetaminophen (TYLENOL) suppository 650 mg  650 mg Rectal Q6H PRN Ivor Costa, MD      . amLODipine (NORVASC) tablet 5 mg  5 mg Oral Daily Ivor Costa, MD   5 mg at 03/15/17  1042  . finasteride (PROSCAR) tablet 5 mg  5 mg Oral Daily Ivor Costa, MD   5 mg at 03/15/17 1042  . gabapentin (NEURONTIN) capsule 100 mg  100 mg Oral BID Ivor Costa, MD   100 mg at 03/15/17 1042  . hydrALAZINE (APRESOLINE) injection 5 mg  5 mg Intravenous Q2H PRN Ivor Costa, MD      . niacin tablet 500 mg  500 mg Oral BID WC Ivor Costa, MD   500 mg at 03/15/17 1600  . ondansetron  (ZOFRAN) tablet 4 mg  4 mg Oral Q6H PRN Ivor Costa, MD       Or  . ondansetron [facility]) injection 4 mg  4 mg Intravenous Q6H PRN Ivor Costa, MD      . pantoprazole (PROTONIX) injection 40 mg  40 mg Intravenous Q12H Ivor Costa, MD   40 mg at 03/15/17 1042  . polyethylene glycol (MIRALAX / GLYCOLAX) packet 17 g  17 g Oral Daily PRN Ivor Costa, MD   17 g at 03/15/17 1050  . vitamin C (ASCORBIC ACID) tablet 500 mg  500 mg Oral Daily Ivor Costa, MD   500 mg at 03/15/17 1042  . zolpidem (AMBIEN) tablet 5 mg  5 mg Oral QHS PRN Ivor Costa, MD         Discharge Medications: Please see discharge summary for a list of discharge medications.  Relevant Imaging Results:  Relevant Lab Results:   Additional Information SSN: [id number]  Truitt Merle, LCSW

## 2017-03-15 NOTE — Progress Notes (Signed)
Physical Therapy Treatment Patient Details Name: Darin [name] MRN: [id number] DOB: 1931-12-13 Today's Date: 03/15/2017    History of Present Illness 83 yo male with acute painless hematochezia with symptomatic ABL anemia admitted  03/12/17 with black stools and syncopal episode in ED.    PT Comments    PT received orders to re-assess for d/c planning as pt wanted to go home instead of SNF.  During session, pt did verbalize that he is weaker than he thought and states he is willing to go to short term SNF rehab.     Follow Up Recommendations  SNF     Equipment Recommendations  None recommended by PT    Recommendations for Other Services       Precautions / Restrictions Precautions Precautions: Fall Precaution Comments: orthostatic hypo, syncopal episode on eval. Restrictions Weight Bearing Restrictions: No    Mobility  Bed Mobility Overal bed mobility: Modified Independent Bed Mobility: Supine to Sit     Supine to sit: Supervision     General bed mobility comments: cues to not use rail  Transfers Overall transfer level: Needs assistance Equipment used: Rolling walker (2 wheeled) Transfers: Sit to/from Stand Sit to Stand: Mod assist;+2 safety/equipment Stand pivot transfers: Min assist       General transfer comment: MOD A to power up and then stood at [facility]. Sat back down and then stood again from elevated surface with MIN A.     Ambulation/Gait             General Gait Details: deferred due to feeling poorly   Stairs            Wheelchair Mobility    Modified Rankin (Stroke Patients Only)       Balance   Sitting-balance support: No upper extremity supported;Feet supported Sitting balance-Leahy Scale: Fair     Standing balance support: Bilateral upper extremity supported;During functional activity Standing balance-Leahy Scale: Poor                              Cognition Arousal/Alertness: Awake/alert Behavior During  Therapy: WFL for tasks assessed/performed Overall Cognitive Status: Within Functional Limits for tasks assessed                                        Exercises      General Comments General comments (skin integrity, edema, etc.): Pt verbalized in session that he realized he was too weak to go home alone and needs rehab.      Pertinent Vitals/Pain      Home Living                      Prior Function            PT Goals (current goals can now be found in the care plan section) Acute Rehab PT Goals Patient Stated Goal: home PT Goal Formulation: With patient Time For Goal Achievement: 03/27/17 Potential to Achieve Goals: Good Progress towards PT goals: Progressing toward goals    Frequency    Min 3X/week      PT Plan Current plan remains appropriate    Co-evaluation              AM-PAC PT "6 Clicks" Daily Activity  Outcome Measure  Difficulty turning over in bed (including adjusting bedclothes, sheets and blankets)?: A  Little Difficulty moving from lying on back to sitting on the side of the bed? : A Lot Difficulty sitting down on and standing up from a chair with arms (e.g., wheelchair, bedside commode, etc,.)?: Unable   Help needed walking in hospital room?: Total Help needed climbing 3-5 steps with a railing? : Total 6 Click Score: 8    End of Session Equipment Utilized During Treatment: Gait belt Activity Tolerance: Patient limited by fatigue Patient left: in chair;with call bell/phone within reach;with chair alarm set Nurse Communication: Mobility status PT Visit Diagnosis: Difficulty in walking, not elsewhere classified (R26.2);Dizziness and giddiness (R42)     Time: 5597-4163 PT Time Calculation (min) (ACUTE ONLY): 20 min  Charges:  $Therapeutic Activity: 8-22 mins                    G Codes:       Mylin Hirano L. Tamala [name], [place] Pager [phone] 03/15/2017    Galen Manila 03/15/2017, 1:05 PM

## 2017-03-15 NOTE — Clinical Social Work Placement (Signed)
   CLINICAL SOCIAL WORK PLACEMENT  NOTE  Date:  03/15/2017  Patient Details  Name: Darin [name] MRN: [id number] Date of Birth: 12/23/1931  Clinical Social Work is seeking post-discharge placement for this patient at the [place] level of care (*CSW will initial, date and re-position this form in  chart as items are completed):  Yes   Patient/family provided with [place] Work Department's list of facilities offering this level of care within the geographic area requested by the patient (or if unable, by the patient's family).  Yes   Patient/family informed of their freedom to choose among providers that offer the needed level of care, that participate in Medicare, Medicaid or managed care program needed by the patient, have an available bed and are willing to accept the patient.  Yes   Patient/family informed of [facility]'s ownership interest in [facility] and [facility], as well as of the fact that they are under no obligation to receive care at these facilities.  PASRR submitted to EDS on 03/15/17     PASRR number received on 03/15/17     Existing PASRR number confirmed on       FL2 transmitted to all facilities in geographic area requested by pt/family on 03/15/17     FL2 transmitted to all facilities within larger geographic area on       Patient informed that his/her managed care company has contracts with or will negotiate with certain facilities, including the following:            Patient/family informed of bed offers received.  Patient chooses bed at       Physician recommends and patient chooses bed at      Patient to be transferred to   on  .  Patient to be transferred to facility by       Patient family notified on   of transfer.  Name of family member notified:        PHYSICIAN Please sign FL2, Please prepare prescriptions     Additional Comment:    _______________________________________________ Truitt Merle, LCSW 03/15/2017, 4:40 PM

## 2017-03-15 NOTE — Progress Notes (Signed)
Echocardiogram 2D Echocardiogram has been performed.  Joelene Millin 03/15/2017, 10:18 AM

## 2017-03-16 DIAGNOSIS — Z111 Encounter for screening for respiratory tuberculosis: Secondary | ICD-10-CM | POA: Diagnosis not present

## 2017-03-16 DIAGNOSIS — M1990 Unspecified osteoarthritis, unspecified site: Secondary | ICD-10-CM | POA: Diagnosis not present

## 2017-03-16 DIAGNOSIS — M1993 Secondary osteoarthritis, unspecified site: Secondary | ICD-10-CM | POA: Diagnosis not present

## 2017-03-16 DIAGNOSIS — R52 Pain, unspecified: Secondary | ICD-10-CM | POA: Diagnosis not present

## 2017-03-16 DIAGNOSIS — N400 Enlarged prostate without lower urinary tract symptoms: Secondary | ICD-10-CM | POA: Diagnosis not present

## 2017-03-16 DIAGNOSIS — M6281 Muscle weakness (generalized): Secondary | ICD-10-CM | POA: Diagnosis not present

## 2017-03-16 DIAGNOSIS — M6259 Muscle wasting and atrophy, not elsewhere classified, multiple sites: Secondary | ICD-10-CM | POA: Diagnosis not present

## 2017-03-16 DIAGNOSIS — K922 Gastrointestinal hemorrhage, unspecified: Secondary | ICD-10-CM | POA: Diagnosis not present

## 2017-03-16 DIAGNOSIS — R55 Syncope and collapse: Secondary | ICD-10-CM | POA: Diagnosis not present

## 2017-03-16 DIAGNOSIS — D649 Anemia, unspecified: Secondary | ICD-10-CM | POA: Diagnosis not present

## 2017-03-16 DIAGNOSIS — K5900 Constipation, unspecified: Secondary | ICD-10-CM | POA: Diagnosis not present

## 2017-03-16 DIAGNOSIS — K625 Hemorrhage of anus and rectum: Secondary | ICD-10-CM | POA: Diagnosis not present

## 2017-03-16 DIAGNOSIS — D62 Acute posthemorrhagic anemia: Secondary | ICD-10-CM | POA: Diagnosis not present

## 2017-03-16 DIAGNOSIS — I10 Essential (primary) hypertension: Secondary | ICD-10-CM | POA: Diagnosis not present

## 2017-03-16 DIAGNOSIS — M353 Polymyalgia rheumatica: Secondary | ICD-10-CM | POA: Diagnosis not present

## 2017-03-16 DIAGNOSIS — K5731 Diverticulosis of large intestine without perforation or abscess with bleeding: Secondary | ICD-10-CM | POA: Diagnosis not present

## 2017-03-16 DIAGNOSIS — K219 Gastro-esophageal reflux disease without esophagitis: Secondary | ICD-10-CM | POA: Diagnosis not present

## 2017-03-16 DIAGNOSIS — E876 Hypokalemia: Secondary | ICD-10-CM | POA: Diagnosis not present

## 2017-03-16 DIAGNOSIS — K5790 Diverticulosis of intestine, part unspecified, without perforation or abscess without bleeding: Secondary | ICD-10-CM | POA: Diagnosis not present

## 2017-03-16 DIAGNOSIS — G64 Other disorders of peripheral nervous system: Secondary | ICD-10-CM | POA: Diagnosis not present

## 2017-03-16 DIAGNOSIS — E785 Hyperlipidemia, unspecified: Secondary | ICD-10-CM | POA: Diagnosis not present

## 2017-03-16 DIAGNOSIS — R278 Other lack of coordination: Secondary | ICD-10-CM | POA: Diagnosis not present

## 2017-03-16 LAB — CBC WITH DIFFERENTIAL/PLATELET
Basophils Absolute: 0.1 10*3/uL (ref 0.0–0.1)
Basophils Relative: 1 %
EOS PCT: 4 %
Eosinophils Absolute: 0.3 10*3/uL (ref 0.0–0.7)
HCT: 29.2 % — ABNORMAL LOW (ref 39.0–52.0)
Hemoglobin: 10 g/dL — ABNORMAL LOW (ref 13.0–17.0)
LYMPHS ABS: 1.6 10*3/uL (ref 0.7–4.0)
LYMPHS PCT: 23 %
MCH: 31.7 pg (ref 26.0–34.0)
MCHC: 34.2 g/dL (ref 30.0–36.0)
MCV: 92.7 fL (ref 78.0–100.0)
MONOS PCT: 12 %
Monocytes Absolute: 0.8 10*3/uL (ref 0.1–1.0)
Neutro Abs: 4 10*3/uL (ref 1.7–7.7)
Neutrophils Relative %: 60 %
PLATELETS: 216 10*3/uL (ref 150–400)
RBC: 3.15 MIL/uL — AB (ref 4.22–5.81)
RDW: 13.8 % (ref 11.5–15.5)
WBC: 6.7 10*3/uL (ref 4.0–10.5)

## 2017-03-16 LAB — BASIC METABOLIC PANEL
Anion gap: 6 (ref 5–15)
BUN: 6 mg/dL (ref 6–20)
CHLORIDE: 101 mmol/L (ref 101–111)
CO2: 29 mmol/L (ref 22–32)
Calcium: 8.8 mg/dL — ABNORMAL LOW (ref 8.9–10.3)
Creatinine, Ser: 0.6 mg/dL — ABNORMAL LOW (ref 0.61–1.24)
GFR calc Af Amer: >60 mL/min (ref >60)
GLUCOSE: 114 mg/dL — AB (ref 65–99)
POTASSIUM: 3.3 mmol/L — AB (ref 3.5–5.1)
Sodium: 136 mmol/L (ref 135–145)

## 2017-03-16 LAB — MAGNESIUM: Magnesium: 1.7 mg/dL (ref 1.7–2.4)

## 2017-03-16 MED ORDER — GABAPENTIN 100 MG PO CAPS: 100.0000 mg | ORAL_CAPSULE | Freq: Two times a day (BID) | ORAL | 1 refills | Status: DC

## 2017-03-16 MED ORDER — POTASSIUM CHLORIDE CRYS ER 20 MEQ PO TBCR
60.0000 meq | EXTENDED_RELEASE_TABLET | Freq: Once | ORAL | Status: AC
  Administered 2017-03-16: 60 meq via ORAL
  Filled 2017-03-16: qty 3

## 2017-03-16 MED ORDER — POLYETHYLENE GLYCOL 3350 17 G PO PACK: 17.0000 g | PACK | Freq: Every day | ORAL | Status: AC | PRN

## 2017-03-16 NOTE — Clinical Social Work Placement (Signed)
   CLINICAL SOCIAL WORK PLACEMENT  NOTE  Date:  03/16/2017  Patient Details  Name: Mylon Mabey MRN: [id number] Date of Birth: [birth year]  Clinical Social Work is seeking post-discharge placement for this patient at the [place] level of care (*CSW will initial, date and re-position this form in  chart as items are completed):  Yes   Patient/family provided with [place] Work Department's list of facilities offering this level of care within the geographic area requested by the patient (or if unable, by the patient's family).  Yes   Patient/family informed of their freedom to choose among providers that offer the needed level of care, that participate in Medicare, Medicaid or managed care program needed by the patient, have an available bed and are willing to accept the patient.  Yes   Patient/family informed of [facility]'s ownership interest in [facility] and [facility], as well as of the fact that they are under no obligation to receive care at these facilities.  PASRR submitted to EDS on 03/15/17     PASRR number received on 03/15/17     Existing PASRR number confirmed on       FL2 transmitted to all facilities in geographic area requested by pt/family on 03/15/17     FL2 transmitted to all facilities within larger geographic area on       Patient informed that his/her managed care company has contracts with or will negotiate with certain facilities, including the following:        Yes   Patient/family informed of bed offers received.  Patient chooses bed at [facility]     Physician recommends and patient chooses bed at      Patient to be transferred to [facility] on 03/16/17.  Patient to be transferred to facility by ptar     Patient family notified on 03/16/17 of transfer.  Name of family member notified:  pt to notify     PHYSICIAN Please sign FL2, Please prepare prescriptions     Additional Comment:     _______________________________________________ Jorge Ny, LCSW 03/16/2017, 10:30 AM

## 2017-03-16 NOTE — Discharge Summary (Signed)
Physician Discharge Summary  Ethelbert Thain ZOX:[id number] DOB: 1931/09/02 DOA: 03/12/2017  PCP: [name], MD  Admit date: 03/12/2017 Discharge date: 03/16/2017  Admitted From: Home Disposition:  SNF  Recommendations for Outpatient Follow-up:  1. Follow up with provider at the nursing home with repeat CBC in the next few days  2. Follow-up with GI/Dr. Carlean Purl in 1-2 weeks   Home Health: No Equipment/Devices: None  Discharge Condition: Stable CODE STATUS: Full Diet recommendation: Heart Healthy   Brief/Interim Summary: 83 year old male with history of hypertension, hyperlipidemia, polymyalgia rheumatica and GERD, BPH, diverticulosis presented with rectal bleeding. GI evaluated the patient. Patient refuses colonoscopy. Hemoglobin has remained stable. Patient was deconditioned and PT recommended nursing home placement. Patient will be discharged to nursing home once bed is available.  Discharge Diagnoses:  Principal Problem:   GIB (gastrointestinal bleeding) Active Problems:   Essential hypertension   GERD (gastroesophageal reflux disease)   PMR (polymyalgia rheumatica) (HCC)   Syncope   BPH (benign prostatic hyperplasia)   Diverticulosis of colon with hemorrhage   Acute blood loss anemia   1. Rectal bleeding- likely diverticular bleed.  Hemoglobin 10 today.  GI follow-up appreciated; GI has signed off.  Patient refused colonoscopy. No further black or bloody bowel movements. Outpatient follow-up with GI. Keep aspirin on hold 2. Syncope-probably secondary to orthostatic hypotension.   improved. Currently not orthostatic. 2D echo showed ejection fraction of 60-65%.  3. Anemia-hemoglobin this morningis 10.Outpatient follow-up 4. Hypertension -continue amlodipine.  Monitor blood pressure. Benazepril/hydrochlorothiazide on hold for now. 5. Polymyalgia rheumatica- meloxicam on hold 6. BPH-continue Proscar 7. Generalized deconditioning-PT recommending SNF placement.    discharge to nursing home once bed is available 8. Hypokalemia-Will replace prior to discharge     Discharge Instructions  Discharge Instructions    Ambulatory referral to Gastroenterology   Complete by:  As directed    Recent Rectal bleed   What is the reason for referral?:  Other   Call MD for:  difficulty breathing, headache or visual disturbances   Complete by:  As directed    Call MD for:  extreme fatigue   Complete by:  As directed    Call MD for:  hives   Complete by:  As directed    Call MD for:  persistant dizziness or light-headedness   Complete by:  As directed    Call MD for:  persistant nausea and vomiting   Complete by:  As directed    Call MD for:  severe uncontrolled pain   Complete by:  As directed    Call MD for:  temperature >100.4   Complete by:  As directed    Diet - low sodium heart healthy   Complete by:  As directed    Increase activity slowly   Complete by:  As directed      Allergies as of 03/16/2017      Reactions   Statins Other (See Comments)   Muscle pain and upset stomach      Medication List    STOP taking these medications   aspirin 81 MG tablet   benazepril-hydrochlorthiazide 10-12.5 MG tablet Commonly known as:  LOTENSIN HCT   meloxicam 15 MG tablet Commonly known as:  MOBIC     TAKE these medications   amLODipine 5 MG tablet Commonly known as:  NORVASC Take 1 tablet (5 mg total) by mouth daily.   famotidine 20 MG tablet Commonly known as:  PEPCID TAKE 1 TABLET AT BEDTIME   finasteride 5 MG  tablet Commonly known as:  PROSCAR Take 1 tablet (5 mg total) by mouth daily.   gabapentin 100 MG capsule Commonly known as:  NEURONTIN Take 1 capsule (100 mg total) by mouth 2 (two) times daily. What changed:  See the new instructions.   niacin 500 MG tablet Take 500 mg by mouth 2 (two) times daily with a meal.   polyethylene glycol packet Commonly known as:  MIRALAX / GLYCOLAX Take 17 g by mouth daily as needed for  moderate constipation. What changed:    when to take this  reasons to take this   vitamin C 500 MG tablet Commonly known as:  ASCORBIC ACID Take 500 mg by mouth daily.      Follow-up Information    Burchette, Alinda Sierras, MD. Schedule an appointment as soon as possible for a visit in 1 week(s).   Specialty:  Family Medicine Why:  after discharge from rehab facility Contact information: [place] [address] [phone]        Gatha Mayer, MD. Schedule an appointment as soon as possible for a visit in 2 week(s).   Specialty:  Gastroenterology Contact information: [address] [place] [address] [phone]          Allergies  Allergen Reactions  . Statins Other (See Comments)    Muscle pain and upset stomach    Consultations:  GI   Procedures/Studies: Ct Head Wo Contrast  Result Date: 03/12/2017 CLINICAL DATA:  Syncope. EXAM: CT HEAD WITHOUT CONTRAST TECHNIQUE: Contiguous axial images were obtained from the base of the skull through the vertex without intravenous contrast. COMPARISON:  CT scan of March 29, 2012. FINDINGS: Brain: Mild diffuse cortical atrophy is noted. No mass effect or midline shift is noted. Ventricular size is within normal limits. There is no evidence of mass lesion, hemorrhage or acute infarction. Vascular: No hyperdense vessel or unexpected calcification. Skull: No acute abnormality is seen the calvarium. Sinuses/Orbits: No acute finding. Other: None. IMPRESSION: Mild diffuse cortical atrophy. No acute intracranial abnormality seen. Electronically Signed   By: Marijo Conception, M.D.   On: 03/12/2017 11:29    Echo on 03/15/2017 Study Conclusions  - Left ventricle: The cavity size was normal. Wall thickness was   normal. Systolic function was normal. The estimated ejection   fraction was in the range of 60% to 65%. Wall motion was normal;   there were no regional wall motion abnormalities. Doppler    parameters are consistent with abnormal left ventricular   relaxation (grade 1 diastolic dysfunction). - Aortic valve: There was mild regurgitation. - Mitral valve: Calcified annulus. - Pulmonary arteries: Systolic pressure was mildly increased.  Impressions:  - Normal LV systolic function; mild diastolic dysfunction; mildly   elevated velocity (2.3 m/s) across aortic valve of uncertain   etology; visually valve opens well; mild AI; trace TR with mildly   elevated pulmonary pressure.    Subjective: Patient seen and examined at bedside. He denies any overnight fever, nausea or vomiting. He feels slightly better  Discharge Exam: Vitals:   03/16/17 0433 03/16/17 0924  BP: (!) 148/73 (!) 134/57  Pulse: 83 87  Resp: 18   Temp: 97.9 F (36.6 C)   SpO2: 97% 100%   Vitals:   03/15/17 1428 03/15/17 2100 03/16/17 0433 03/16/17 0924  BP: (!) 146/58 (!) 147/67 (!) 148/73 (!) 134/57  Pulse: 85 89 83 87  Resp: 14 18 18    Temp: 98.1 F (36.7 C) 98.5 F (  36.9 C) 97.9 F (36.6 C)   TempSrc: Oral Oral Oral   SpO2: 99% 97% 97% 100%  Weight:      Height:        General: Pt is alert, awake, not in acute distress Cardiovascular: Rate controlled, S1/S2 + Respiratory: Bilateral decreased breath sounds at bases  Abdominal: Soft, NT, ND, bowel sounds + Extremities: no edema, no cyanosis    The results of significant diagnostics from this hospitalization (including imaging, microbiology, ancillary and laboratory) are listed below for reference.     Microbiology: No results found for this or any previous visit (from the past 240 hour(s)).   Labs: BNP (last 3 results) No results for input(s): BNP in the last 8760 hours. Basic Metabolic Panel: Recent Labs  Lab 03/12/17 0819 03/13/17 0032 03/14/17 0426 03/15/17 0514 03/16/17 0523  NA 133* 136 136 137 136  K 3.9 3.9 3.7 4.1 3.3*  CL 99* 107 105 104 101  CO2 28 24 24 27 29   GLUCOSE 118* 111* 111* 114* 114*  BUN 20 14 8  5* 6   CREATININE 0.73 0.53* 0.61 0.54* 0.60*  CALCIUM 9.0 8.6* 8.7* 8.9 8.8*  MG  --   --   --   --  1.7   Liver Function Tests: Recent Labs  Lab 03/12/17 0819  AST 19  ALT 15*  ALKPHOS 52  BILITOT 0.6  PROT 6.1*  ALBUMIN 3.1*   No results for input(s): LIPASE, AMYLASE in the last 168 hours. No results for input(s): AMMONIA in the last 168 hours. CBC: Recent Labs  Lab 03/13/17 0630 03/13/17 1557 03/14/17 0426 03/15/17 0514 03/16/17 0523  WBC 5.6 8.7 6.7 7.2 6.7  NEUTROABS  --   --   --   --  4.0  HGB 9.8* 10.2* 9.9* 10.0* 10.0*  HCT 29.5* 30.2* 29.5* 30.1* 29.2*  MCV 93.7 93.5 92.8 93.2 92.7  PLT 201 209 188 204 216   Cardiac Enzymes: No results for input(s): CKTOTAL, CKMB, CKMBINDEX, TROPONINI in the last 168 hours. BNP: Invalid input(s): POCBNP CBG: Recent Labs  Lab 03/12/17 0854 03/13/17 0738 03/14/17 0746 03/15/17 0747  GLUCAP 113* 110* 101* 106*   D-Dimer No results for input(s): DDIMER in the last 72 hours. Hgb A1c No results for input(s): HGBA1C in the last 72 hours. Lipid Profile No results for input(s): CHOL, HDL, LDLCALC, TRIG, CHOLHDL, LDLDIRECT in the last 72 hours. Thyroid function studies No results for input(s): TSH, T4TOTAL, T3FREE, THYROIDAB in the last 72 hours.  Invalid input(s): FREET3 Anemia work up No results for input(s): VITAMINB12, FOLATE, FERRITIN, TIBC, IRON, RETICCTPCT in the last 72 hours. Urinalysis    Component Value Date/Time   COLORURINE YELLOW 03/29/2012 [place] 03/29/2012 1458   LABSPEC 1.019 03/29/2012 1458   PHURINE 6.0 03/29/2012 1458   GLUCOSEU NEGATIVE 03/29/2012 1458   HGBUR NEGATIVE 03/29/2012 1458   HGBUR negative 11/30/2009 1133   BILIRUBINUR NEGATIVE 03/29/2012 1458   KETONESUR 40 (A) 03/29/2012 1458   PROTEINUR NEGATIVE 03/29/2012 1458   UROBILINOGEN 0.2 03/29/2012 1458   NITRITE NEGATIVE 03/29/2012 1458   LEUKOCYTESUR NEGATIVE 03/29/2012 1458   Sepsis Labs Invalid input(s):  PROCALCITONIN,  WBC,  LACTICIDVEN Microbiology No results found for this or any previous visit (from the past 240 hour(s)).   Time coordinating discharge: 35 minutes  SIGNED:   Aline August, MD  [facility] 03/16/2017, 9:49 AM Pager: [phone]  If 7PM-7AM, please contact night-coverage [email or web address] Password [email or web address]

## 2017-03-16 NOTE — Care Management Important Message (Signed)
Important Message  Patient Details  Name: Darin [name] MRN: [id number] Date of Birth: 02/18/1932   Medicare Important Message Given:  Yes    Erenest Rasher, RN 03/16/2017, 10:12 AM

## 2017-03-16 NOTE — Progress Notes (Signed)
Patient will discharge to [facility] Anticipated discharge date: 12/23 Family notified: pt to notify family Transportation by [facility]- scheduled for 1:30pm  CSW signing off.  Jorge Ny, LCSW Clinical Social Worker [phone]

## 2017-03-16 NOTE — Progress Notes (Signed)
Report called to Lelon Frohlich at [facility].

## 2017-03-17 ENCOUNTER — Encounter: Payer: Self-pay | Admitting: Gastroenterology

## 2017-03-19 DIAGNOSIS — K219 Gastro-esophageal reflux disease without esophagitis: Secondary | ICD-10-CM | POA: Diagnosis not present

## 2017-03-19 DIAGNOSIS — N400 Enlarged prostate without lower urinary tract symptoms: Secondary | ICD-10-CM | POA: Diagnosis not present

## 2017-03-19 DIAGNOSIS — M353 Polymyalgia rheumatica: Secondary | ICD-10-CM | POA: Diagnosis not present

## 2017-03-19 DIAGNOSIS — E785 Hyperlipidemia, unspecified: Secondary | ICD-10-CM | POA: Diagnosis not present

## 2017-03-19 DIAGNOSIS — K5900 Constipation, unspecified: Secondary | ICD-10-CM | POA: Diagnosis not present

## 2017-03-19 DIAGNOSIS — G64 Other disorders of peripheral nervous system: Secondary | ICD-10-CM | POA: Diagnosis not present

## 2017-03-19 DIAGNOSIS — R55 Syncope and collapse: Secondary | ICD-10-CM | POA: Diagnosis not present

## 2017-03-19 DIAGNOSIS — M6281 Muscle weakness (generalized): Secondary | ICD-10-CM | POA: Diagnosis not present

## 2017-03-19 DIAGNOSIS — K625 Hemorrhage of anus and rectum: Secondary | ICD-10-CM | POA: Diagnosis not present

## 2017-03-19 DIAGNOSIS — I10 Essential (primary) hypertension: Secondary | ICD-10-CM | POA: Diagnosis not present

## 2017-03-19 DIAGNOSIS — E876 Hypokalemia: Secondary | ICD-10-CM | POA: Diagnosis not present

## 2017-03-19 DIAGNOSIS — D649 Anemia, unspecified: Secondary | ICD-10-CM | POA: Diagnosis not present

## 2017-03-21 DIAGNOSIS — M6281 Muscle weakness (generalized): Secondary | ICD-10-CM | POA: Diagnosis not present

## 2017-03-21 DIAGNOSIS — K5900 Constipation, unspecified: Secondary | ICD-10-CM | POA: Diagnosis not present

## 2017-03-21 DIAGNOSIS — E785 Hyperlipidemia, unspecified: Secondary | ICD-10-CM | POA: Diagnosis not present

## 2017-03-21 DIAGNOSIS — N400 Enlarged prostate without lower urinary tract symptoms: Secondary | ICD-10-CM | POA: Diagnosis not present

## 2017-03-21 DIAGNOSIS — K219 Gastro-esophageal reflux disease without esophagitis: Secondary | ICD-10-CM | POA: Diagnosis not present

## 2017-03-21 DIAGNOSIS — G64 Other disorders of peripheral nervous system: Secondary | ICD-10-CM | POA: Diagnosis not present

## 2017-03-21 DIAGNOSIS — E876 Hypokalemia: Secondary | ICD-10-CM | POA: Diagnosis not present

## 2017-03-21 DIAGNOSIS — D649 Anemia, unspecified: Secondary | ICD-10-CM | POA: Diagnosis not present

## 2017-03-21 DIAGNOSIS — R55 Syncope and collapse: Secondary | ICD-10-CM | POA: Diagnosis not present

## 2017-03-21 DIAGNOSIS — K625 Hemorrhage of anus and rectum: Secondary | ICD-10-CM | POA: Diagnosis not present

## 2017-03-21 DIAGNOSIS — I10 Essential (primary) hypertension: Secondary | ICD-10-CM | POA: Diagnosis not present

## 2017-03-21 DIAGNOSIS — M353 Polymyalgia rheumatica: Secondary | ICD-10-CM | POA: Diagnosis not present

## 2017-03-24 DIAGNOSIS — I10 Essential (primary) hypertension: Secondary | ICD-10-CM | POA: Diagnosis not present

## 2017-03-24 DIAGNOSIS — R55 Syncope and collapse: Secondary | ICD-10-CM | POA: Diagnosis not present

## 2017-03-24 DIAGNOSIS — E876 Hypokalemia: Secondary | ICD-10-CM | POA: Diagnosis not present

## 2017-03-24 DIAGNOSIS — M6281 Muscle weakness (generalized): Secondary | ICD-10-CM | POA: Diagnosis not present

## 2017-03-24 DIAGNOSIS — D649 Anemia, unspecified: Secondary | ICD-10-CM | POA: Diagnosis not present

## 2017-03-24 DIAGNOSIS — K219 Gastro-esophageal reflux disease without esophagitis: Secondary | ICD-10-CM | POA: Diagnosis not present

## 2017-03-24 DIAGNOSIS — M1990 Unspecified osteoarthritis, unspecified site: Secondary | ICD-10-CM | POA: Diagnosis not present

## 2017-03-24 DIAGNOSIS — M353 Polymyalgia rheumatica: Secondary | ICD-10-CM | POA: Diagnosis not present

## 2017-03-24 DIAGNOSIS — K625 Hemorrhage of anus and rectum: Secondary | ICD-10-CM | POA: Diagnosis not present

## 2017-03-24 DIAGNOSIS — G64 Other disorders of peripheral nervous system: Secondary | ICD-10-CM | POA: Diagnosis not present

## 2017-03-24 DIAGNOSIS — E785 Hyperlipidemia, unspecified: Secondary | ICD-10-CM | POA: Diagnosis not present

## 2017-03-24 DIAGNOSIS — N400 Enlarged prostate without lower urinary tract symptoms: Secondary | ICD-10-CM | POA: Diagnosis not present

## 2017-03-26 DIAGNOSIS — D649 Anemia, unspecified: Secondary | ICD-10-CM | POA: Diagnosis not present

## 2017-03-26 DIAGNOSIS — M1990 Unspecified osteoarthritis, unspecified site: Secondary | ICD-10-CM | POA: Diagnosis not present

## 2017-03-26 DIAGNOSIS — E785 Hyperlipidemia, unspecified: Secondary | ICD-10-CM | POA: Diagnosis not present

## 2017-03-26 DIAGNOSIS — R55 Syncope and collapse: Secondary | ICD-10-CM | POA: Diagnosis not present

## 2017-03-26 DIAGNOSIS — N400 Enlarged prostate without lower urinary tract symptoms: Secondary | ICD-10-CM | POA: Diagnosis not present

## 2017-03-26 DIAGNOSIS — I10 Essential (primary) hypertension: Secondary | ICD-10-CM | POA: Diagnosis not present

## 2017-03-26 DIAGNOSIS — K219 Gastro-esophageal reflux disease without esophagitis: Secondary | ICD-10-CM | POA: Diagnosis not present

## 2017-03-26 DIAGNOSIS — K625 Hemorrhage of anus and rectum: Secondary | ICD-10-CM | POA: Diagnosis not present

## 2017-03-26 DIAGNOSIS — M353 Polymyalgia rheumatica: Secondary | ICD-10-CM | POA: Diagnosis not present

## 2017-03-26 DIAGNOSIS — M6281 Muscle weakness (generalized): Secondary | ICD-10-CM | POA: Diagnosis not present

## 2017-03-26 DIAGNOSIS — G64 Other disorders of peripheral nervous system: Secondary | ICD-10-CM | POA: Diagnosis not present

## 2017-03-26 DIAGNOSIS — E876 Hypokalemia: Secondary | ICD-10-CM | POA: Diagnosis not present

## 2017-03-27 ENCOUNTER — Other Ambulatory Visit: Payer: Self-pay | Admitting: Family Medicine

## 2017-03-27 ENCOUNTER — Other Ambulatory Visit: Payer: Self-pay

## 2017-03-27 DIAGNOSIS — E785 Hyperlipidemia, unspecified: Secondary | ICD-10-CM | POA: Diagnosis not present

## 2017-03-27 DIAGNOSIS — D649 Anemia, unspecified: Secondary | ICD-10-CM | POA: Diagnosis not present

## 2017-03-27 DIAGNOSIS — I10 Essential (primary) hypertension: Secondary | ICD-10-CM | POA: Diagnosis not present

## 2017-03-27 DIAGNOSIS — K219 Gastro-esophageal reflux disease without esophagitis: Secondary | ICD-10-CM | POA: Diagnosis not present

## 2017-03-27 DIAGNOSIS — K625 Hemorrhage of anus and rectum: Secondary | ICD-10-CM | POA: Diagnosis not present

## 2017-03-27 DIAGNOSIS — N400 Enlarged prostate without lower urinary tract symptoms: Secondary | ICD-10-CM | POA: Diagnosis not present

## 2017-03-27 DIAGNOSIS — R55 Syncope and collapse: Secondary | ICD-10-CM | POA: Diagnosis not present

## 2017-03-27 DIAGNOSIS — G64 Other disorders of peripheral nervous system: Secondary | ICD-10-CM | POA: Diagnosis not present

## 2017-03-27 DIAGNOSIS — E876 Hypokalemia: Secondary | ICD-10-CM | POA: Diagnosis not present

## 2017-03-27 DIAGNOSIS — M1993 Secondary osteoarthritis, unspecified site: Secondary | ICD-10-CM | POA: Diagnosis not present

## 2017-03-27 DIAGNOSIS — M6281 Muscle weakness (generalized): Secondary | ICD-10-CM | POA: Diagnosis not present

## 2017-03-27 DIAGNOSIS — M353 Polymyalgia rheumatica: Secondary | ICD-10-CM | POA: Diagnosis not present

## 2017-03-27 MED ORDER — FINASTERIDE 5 MG PO TABS: 5.0000 mg | ORAL_TABLET | Freq: Every day | ORAL | 0 refills | Status: DC

## 2017-03-27 NOTE — Telephone Encounter (Signed)
Copied from [place] #[id number]. Topic: Quick Communication - See Telephone Encounter >> Mar 27, 2017 11:27 AM Ether Griffins B wrote: CRM for notification. See Telephone encounter for:  Pt at pharmacy and stated Dr. Elease Hashimoto was going to call in finasteride. But it hasnt been called in.  03/27/17.

## 2017-03-27 NOTE — Addendum Note (Signed)
Addended by: Westley Hummer B on: 03/27/2017 03:21 PM   Modules accepted: Orders

## 2017-03-28 ENCOUNTER — Other Ambulatory Visit: Payer: Self-pay | Admitting: Family Medicine

## 2017-03-28 DIAGNOSIS — I10 Essential (primary) hypertension: Secondary | ICD-10-CM

## 2017-03-28 NOTE — Telephone Encounter (Signed)
Amlodipine filled to pharmacy as requested.   Please advise gabapentin request. Last filled to local pharmacy by hospitalist on 03/16/17 for 100 mg BID. Rx request is for 100 mg QID. Patient was discharged from hospital to SNF.

## 2017-03-28 NOTE — Telephone Encounter (Signed)
Refill for 6 months. 

## 2017-03-28 NOTE — Telephone Encounter (Signed)
Medication filled to pharmacy as requested.   

## 2017-04-01 ENCOUNTER — Ambulatory Visit: Payer: Medicare Other | Admitting: Gastroenterology

## 2017-04-01 ENCOUNTER — Ambulatory Visit: Payer: Medicare Other | Admitting: Podiatry

## 2017-04-02 ENCOUNTER — Ambulatory Visit (INDEPENDENT_AMBULATORY_CARE_PROVIDER_SITE_OTHER): Payer: Medicare Other | Admitting: Family Medicine

## 2017-04-02 ENCOUNTER — Encounter: Payer: Self-pay | Admitting: Family Medicine

## 2017-04-02 VITALS — BP 120/58 | HR 84 | Temp 97.7°F | Wt 206.0 lb

## 2017-04-02 DIAGNOSIS — Z9181 History of falling: Secondary | ICD-10-CM | POA: Diagnosis not present

## 2017-04-02 DIAGNOSIS — I10 Essential (primary) hypertension: Secondary | ICD-10-CM

## 2017-04-02 DIAGNOSIS — D509 Iron deficiency anemia, unspecified: Secondary | ICD-10-CM

## 2017-04-02 DIAGNOSIS — E876 Hypokalemia: Secondary | ICD-10-CM

## 2017-04-02 DIAGNOSIS — K5731 Diverticulosis of large intestine without perforation or abscess with bleeding: Secondary | ICD-10-CM

## 2017-04-02 LAB — CBC WITH DIFFERENTIAL/PLATELET
BASOS PCT: 0.7 % (ref 0.0–3.0)
Basophils Absolute: 0.1 10*3/uL (ref 0.0–0.1)
EOS ABS: 0.1 10*3/uL (ref 0.0–0.7)
Eosinophils Relative: 1.6 % (ref 0.0–5.0)
HCT: 33.4 % — ABNORMAL LOW (ref 39.0–52.0)
Hemoglobin: 11.1 g/dL — ABNORMAL LOW (ref 13.0–17.0)
LYMPHS ABS: 0.9 10*3/uL (ref 0.7–4.0)
Lymphocytes Relative: 11.5 % — ABNORMAL LOW (ref 12.0–46.0)
MCHC: 33.3 g/dL (ref 30.0–36.0)
MCV: 95 fl (ref 78.0–100.0)
MONO ABS: 0.9 10*3/uL (ref 0.1–1.0)
Monocytes Relative: 10.7 % (ref 3.0–12.0)
NEUTROS ABS: 6.1 10*3/uL (ref 1.4–7.7)
NEUTROS PCT: 75.5 % (ref 43.0–77.0)
PLATELETS: 240 10*3/uL (ref 150.0–400.0)
RBC: 3.51 Mil/uL — ABNORMAL LOW (ref 4.22–5.81)
RDW: 14.6 % (ref 11.5–15.5)
WBC: 8.1 10*3/uL (ref 4.0–10.5)

## 2017-04-02 LAB — BASIC METABOLIC PANEL
BUN: 19 mg/dL (ref 6–23)
CALCIUM: 9.1 mg/dL (ref 8.4–10.5)
CO2: 31 mEq/L (ref 19–32)
Chloride: 98 mEq/L (ref 96–112)
Creatinine, Ser: 0.67 mg/dL (ref 0.40–1.50)
GFR: 120.26 mL/min (ref >60.00)
Glucose, Bld: 101 mg/dL — ABNORMAL HIGH (ref 70–99)
POTASSIUM: 4 meq/L (ref 3.5–5.1)
SODIUM: 136 meq/L (ref 135–145)

## 2017-04-02 MED ORDER — DICLOFENAC SODIUM 1 % TD GEL: 2.0000 g | Freq: Four times a day (QID) | TRANSDERMAL | 3 refills | Status: DC

## 2017-04-02 NOTE — Patient Instructions (Signed)
HOLD Meloxicam for now Try topical Diclofenac, as discussed. No driving for now.   We are setting up home PT and OT.

## 2017-04-02 NOTE — Progress Notes (Signed)
Subjective:     Patient ID: Darin [name], male   DOB: 26-Jan-1932, 84 y.o.   MRN: [id number]  HPI Patient seen following recent hospitalization and rehabilitation stay. His chronic problems include history of hypertension, COPD, polyneuropathy, osteoarthritis especially involving knees, BPH, hyperlipidemia  Recent admission 12/19 through the 23rd for lower GI bleed. Felt most likely to be related to diverticulosis. Patient refused colonoscopy. Baseline hemoglobin appears to be around 13-14 and this was down around 10 at discharge and remained stable during hospitalization.  Denied any melena. Type of bleeding was most consistent with diverticulosis bleed. He did have one episode of syncope. Echocardiogram ejection fraction 60-65% with no major valve problems. He has hypertension and amlodipine was continued and benazepril HCTZ was held along with meloxicam and aspirin. He has some generalized deconditioning and high risk for fall. Also had mild hypokalemia.  Was discharged to rehabilitation facility and left for home January 3. Apparently, no home OT or PT was set up.  Daughter is visiting here from [place]. She is very interested in getting home OT and PT set up. They are also concerned about his risk for driving and he has been advised not to drive at this time.  He apparently did start back his amlodipine and benazepril HCTZ. Denies any orthostatic symptoms. Has been taking some Meloxicam apparently for arthritis  Past Medical History:  Diagnosis Date  . Arthritis   . GERD (gastroesophageal reflux disease)   . H/O hiatal hernia    "FOR YEARS" " NO PROBLEM"  . Hyperlipidemia   . Hypertension   . Knee pain   . Neuropathy    PERIPHERAL   . Osteoporosis   . PMR (polymyalgia rheumatica) (HCC)   . Polymyalgia ([place]) 5 YEARS   Past Surgical History:  Procedure Laterality Date  . CARPAL TUNNEL RELEASE    . CATARACT EXTRACTION     right  . COLONOSCOPY  2007  . INGUINAL HERNIA REPAIR   10/14/2011   Procedure: HERNIA REPAIR INGUINAL ADULT;  Surgeon: [name], MD;  Location: [place];  Service: General;  Laterality: Right;  Right Inguinal Hernia Repair with Mesh  . TONSILLECTOMY    . VARICOSE VEIN SURGERY      reports that  has never smoked. he has never used smokeless tobacco. He reports that he drinks about 1.8 oz of alcohol per week. He reports that he does not use drugs. family history includes Coronary artery disease in his unknown relative; Heart disease in his father. Allergies  Allergen Reactions  . Statins Other (See Comments)    Muscle pain and upset stomach     Review of Systems  Constitutional: Positive for fatigue. Negative for chills and fever.  Eyes: Negative for visual disturbance.  Respiratory: Negative for cough, chest tightness and shortness of breath.   Cardiovascular: Negative for chest pain, palpitations and leg swelling.  Gastrointestinal: Negative for abdominal pain, blood in stool, constipation, diarrhea, nausea and vomiting.  Endocrine: Negative for polydipsia and polyuria.  Genitourinary: Negative for dysuria.  Neurological: Negative for dizziness, syncope, light-headedness and headaches.  Hematological: Negative for adenopathy.  Psychiatric/Behavioral: Negative for confusion.       Objective:   Physical Exam  Constitutional: He is oriented to person, place, and time. He appears well-developed and well-nourished.  Neck: Neck supple.  Cardiovascular: Normal rate.  Pulmonary/Chest: Effort normal and breath sounds normal. No respiratory distress. He has no wheezes. He has no rales.  Abdominal: Soft. There is no tenderness.  Musculoskeletal: He  exhibits no edema.  Neurological: He is alert and oriented to person, place, and time. No cranial nerve deficit.  Skin:  Nailbeds and conjunctiva are slightly pale  Psychiatric: He has a normal mood and affect. His behavior is normal.       Assessment:     #1 recent acute GI bleed  probably related to diverticulosis bleed with discharge hemoglobin around 10.0. Currently stable. Blood pressure standing does not reveal any orthostatic drop-was 122/58  #2 mild hypokalemia-during recent hospitalization  #3 hypertension stable  #4 high-risk for falls. He has history of neuropathy along with osteoarthritis and now generalized weakness related to his GI bleed  #5 history of BPH stable on Proscar    Plan:     -Advise discontinuation of meloxicam -Consider trial of diclofenac 1% gel 3-4 times daily for knee pain and may supplement with Tylenol as needed -No driving for now -Recheck labs with CBC and basic metabolic panel -Set up home physical therapy and occupational therapy -Consider possible future gradual reduction in gabapentin with his balance issues though he still has some peripheral neuropathy pains -Over 40 minutes spent with patient and his daughter face-to-face of which > 50% coordinating care, reviewing medications, discussing home health services, looking at safety issues at home and with driving  [name] MD [facility] at [facility]

## 2017-04-04 ENCOUNTER — Encounter: Payer: Self-pay | Admitting: Podiatry

## 2017-04-04 ENCOUNTER — Ambulatory Visit (INDEPENDENT_AMBULATORY_CARE_PROVIDER_SITE_OTHER): Payer: Medicare Other | Admitting: Podiatry

## 2017-04-04 DIAGNOSIS — M353 Polymyalgia rheumatica: Secondary | ICD-10-CM | POA: Diagnosis not present

## 2017-04-04 DIAGNOSIS — M79675 Pain in left toe(s): Secondary | ICD-10-CM

## 2017-04-04 DIAGNOSIS — M79674 Pain in right toe(s): Secondary | ICD-10-CM

## 2017-04-04 DIAGNOSIS — D509 Iron deficiency anemia, unspecified: Secondary | ICD-10-CM | POA: Diagnosis not present

## 2017-04-04 DIAGNOSIS — M170 Bilateral primary osteoarthritis of knee: Secondary | ICD-10-CM | POA: Diagnosis not present

## 2017-04-04 DIAGNOSIS — I10 Essential (primary) hypertension: Secondary | ICD-10-CM | POA: Diagnosis not present

## 2017-04-04 DIAGNOSIS — G629 Polyneuropathy, unspecified: Secondary | ICD-10-CM | POA: Diagnosis not present

## 2017-04-04 DIAGNOSIS — B351 Tinea unguium: Secondary | ICD-10-CM

## 2017-04-04 DIAGNOSIS — J449 Chronic obstructive pulmonary disease, unspecified: Secondary | ICD-10-CM | POA: Diagnosis not present

## 2017-04-04 DIAGNOSIS — Z9181 History of falling: Secondary | ICD-10-CM | POA: Diagnosis not present

## 2017-04-04 DIAGNOSIS — M810 Age-related osteoporosis without current pathological fracture: Secondary | ICD-10-CM | POA: Diagnosis not present

## 2017-04-04 NOTE — Progress Notes (Signed)
Complaint:  Visit Type: Patient returns to my office for continued preventative foot care services. Complaint: Patient states" my nails have grown long and thick and become painful to walk and wear shoes" Patient has been diagnosed with neuropathy.. The patient presents for preventative foot care services. No changes to ROS  Podiatric Exam: Vascular: dorsalis pedis and posterior tibial pulses are palpable bilateral. Capillary return is immediate. Temperature gradient is WNL. Skin turgor WNL  Sensorium: Normal Semmes Weinstein monofilament test. Normal tactile sensation bilaterally. Nail Exam: Pt has thick disfigured discolored nails with subungual debris noted bilateral entire nail hallux through fifth toenails Ulcer Exam: There is no evidence of ulcer or pre-ulcerative changes or infection. Orthopedic Exam: Muscle tone and strength are WNL. No limitations in general ROM. No crepitus or effusions noted. Foot type and digits show no abnormalities. Bony prominences are unremarkable. Skin: No Porokeratosis. No infection or ulcers  Diagnosis:  Onychomycosis, , Pain in right toe, pain in left toes  Treatment & Plan Procedures and Treatment: Consent by patient was obtained for treatment procedures. The patient understood the discussion of treatment and procedures well. All questions were answered thoroughly reviewed. Debridement of mycotic and hypertrophic toenails, 1 through 5 bilateral and clearing of subungual debris. No ulceration, no infection noted.  Return Visit-Office Procedure: Patient instructed to return to the office for a follow up visit 3 months for continued evaluation and treatment.    Kohei Antonellis DPM 

## 2017-04-07 ENCOUNTER — Ambulatory Visit: Payer: PRIVATE HEALTH INSURANCE | Admitting: Podiatry

## 2017-04-07 ENCOUNTER — Telehealth: Payer: Self-pay | Admitting: Family Medicine

## 2017-04-07 NOTE — Telephone Encounter (Signed)
Copied from [place]. Topic: General - Other >> Apr 07, 2017  1:52 PM Synthia Innocent wrote: Reason for CRM: Needing verbal orders for PT 1x a week for 1 weeks and 2x weeks for 5 weeks. Patient declined OT

## 2017-04-07 NOTE — Telephone Encounter (Signed)
ok 

## 2017-04-08 NOTE — Telephone Encounter (Signed)
Copied from [place]. Topic: General - Other >> Apr 08, 2017  1:32 PM Antonieta Iba C wrote: Requesting order for Darin [name] - PT/ [facility] called in to revise. He said that he misunderstood pt.   CB: [phone]

## 2017-04-08 NOTE — Telephone Encounter (Signed)
ok 

## 2017-04-08 NOTE — Telephone Encounter (Signed)
Requesting order for Darin [name] - PT/ [facility] called in to revise. He said that he misunderstood pt.   CB: [phone]

## 2017-04-09 DIAGNOSIS — M170 Bilateral primary osteoarthritis of knee: Secondary | ICD-10-CM | POA: Diagnosis not present

## 2017-04-09 DIAGNOSIS — I10 Essential (primary) hypertension: Secondary | ICD-10-CM | POA: Diagnosis not present

## 2017-04-09 DIAGNOSIS — G629 Polyneuropathy, unspecified: Secondary | ICD-10-CM | POA: Diagnosis not present

## 2017-04-09 DIAGNOSIS — M353 Polymyalgia rheumatica: Secondary | ICD-10-CM | POA: Diagnosis not present

## 2017-04-09 DIAGNOSIS — J449 Chronic obstructive pulmonary disease, unspecified: Secondary | ICD-10-CM | POA: Diagnosis not present

## 2017-04-09 DIAGNOSIS — M810 Age-related osteoporosis without current pathological fracture: Secondary | ICD-10-CM | POA: Diagnosis not present

## 2017-04-09 NOTE — Telephone Encounter (Signed)
Verbal orders given to David.  °

## 2017-04-11 DIAGNOSIS — J449 Chronic obstructive pulmonary disease, unspecified: Secondary | ICD-10-CM | POA: Diagnosis not present

## 2017-04-11 DIAGNOSIS — M353 Polymyalgia rheumatica: Secondary | ICD-10-CM | POA: Diagnosis not present

## 2017-04-11 DIAGNOSIS — G629 Polyneuropathy, unspecified: Secondary | ICD-10-CM | POA: Diagnosis not present

## 2017-04-11 DIAGNOSIS — M170 Bilateral primary osteoarthritis of knee: Secondary | ICD-10-CM | POA: Diagnosis not present

## 2017-04-11 DIAGNOSIS — I10 Essential (primary) hypertension: Secondary | ICD-10-CM | POA: Diagnosis not present

## 2017-04-11 DIAGNOSIS — M810 Age-related osteoporosis without current pathological fracture: Secondary | ICD-10-CM | POA: Diagnosis not present

## 2017-04-15 DIAGNOSIS — I10 Essential (primary) hypertension: Secondary | ICD-10-CM | POA: Diagnosis not present

## 2017-04-15 DIAGNOSIS — J449 Chronic obstructive pulmonary disease, unspecified: Secondary | ICD-10-CM | POA: Diagnosis not present

## 2017-04-15 DIAGNOSIS — M170 Bilateral primary osteoarthritis of knee: Secondary | ICD-10-CM | POA: Diagnosis not present

## 2017-04-15 DIAGNOSIS — M810 Age-related osteoporosis without current pathological fracture: Secondary | ICD-10-CM | POA: Diagnosis not present

## 2017-04-15 DIAGNOSIS — M353 Polymyalgia rheumatica: Secondary | ICD-10-CM | POA: Diagnosis not present

## 2017-04-15 DIAGNOSIS — G629 Polyneuropathy, unspecified: Secondary | ICD-10-CM | POA: Diagnosis not present

## 2017-04-18 DIAGNOSIS — G629 Polyneuropathy, unspecified: Secondary | ICD-10-CM | POA: Diagnosis not present

## 2017-04-18 DIAGNOSIS — M353 Polymyalgia rheumatica: Secondary | ICD-10-CM | POA: Diagnosis not present

## 2017-04-18 DIAGNOSIS — M810 Age-related osteoporosis without current pathological fracture: Secondary | ICD-10-CM | POA: Diagnosis not present

## 2017-04-18 DIAGNOSIS — M170 Bilateral primary osteoarthritis of knee: Secondary | ICD-10-CM | POA: Diagnosis not present

## 2017-04-18 DIAGNOSIS — I10 Essential (primary) hypertension: Secondary | ICD-10-CM | POA: Diagnosis not present

## 2017-04-18 DIAGNOSIS — J449 Chronic obstructive pulmonary disease, unspecified: Secondary | ICD-10-CM | POA: Diagnosis not present

## 2017-04-22 DIAGNOSIS — M810 Age-related osteoporosis without current pathological fracture: Secondary | ICD-10-CM | POA: Diagnosis not present

## 2017-04-22 DIAGNOSIS — M170 Bilateral primary osteoarthritis of knee: Secondary | ICD-10-CM | POA: Diagnosis not present

## 2017-04-22 DIAGNOSIS — J449 Chronic obstructive pulmonary disease, unspecified: Secondary | ICD-10-CM | POA: Diagnosis not present

## 2017-04-22 DIAGNOSIS — M353 Polymyalgia rheumatica: Secondary | ICD-10-CM | POA: Diagnosis not present

## 2017-04-22 DIAGNOSIS — I10 Essential (primary) hypertension: Secondary | ICD-10-CM | POA: Diagnosis not present

## 2017-04-22 DIAGNOSIS — G629 Polyneuropathy, unspecified: Secondary | ICD-10-CM | POA: Diagnosis not present

## 2017-04-25 DIAGNOSIS — I10 Essential (primary) hypertension: Secondary | ICD-10-CM | POA: Diagnosis not present

## 2017-04-25 DIAGNOSIS — M170 Bilateral primary osteoarthritis of knee: Secondary | ICD-10-CM | POA: Diagnosis not present

## 2017-04-25 DIAGNOSIS — J449 Chronic obstructive pulmonary disease, unspecified: Secondary | ICD-10-CM | POA: Diagnosis not present

## 2017-04-25 DIAGNOSIS — M353 Polymyalgia rheumatica: Secondary | ICD-10-CM | POA: Diagnosis not present

## 2017-04-25 DIAGNOSIS — M810 Age-related osteoporosis without current pathological fracture: Secondary | ICD-10-CM | POA: Diagnosis not present

## 2017-04-25 DIAGNOSIS — G629 Polyneuropathy, unspecified: Secondary | ICD-10-CM | POA: Diagnosis not present

## 2017-04-29 DIAGNOSIS — I10 Essential (primary) hypertension: Secondary | ICD-10-CM | POA: Diagnosis not present

## 2017-04-29 DIAGNOSIS — J449 Chronic obstructive pulmonary disease, unspecified: Secondary | ICD-10-CM | POA: Diagnosis not present

## 2017-04-29 DIAGNOSIS — M810 Age-related osteoporosis without current pathological fracture: Secondary | ICD-10-CM | POA: Diagnosis not present

## 2017-04-29 DIAGNOSIS — G629 Polyneuropathy, unspecified: Secondary | ICD-10-CM | POA: Diagnosis not present

## 2017-04-29 DIAGNOSIS — M170 Bilateral primary osteoarthritis of knee: Secondary | ICD-10-CM | POA: Diagnosis not present

## 2017-04-29 DIAGNOSIS — M353 Polymyalgia rheumatica: Secondary | ICD-10-CM | POA: Diagnosis not present

## 2017-05-01 DIAGNOSIS — J449 Chronic obstructive pulmonary disease, unspecified: Secondary | ICD-10-CM | POA: Diagnosis not present

## 2017-05-01 DIAGNOSIS — G629 Polyneuropathy, unspecified: Secondary | ICD-10-CM | POA: Diagnosis not present

## 2017-05-01 DIAGNOSIS — I10 Essential (primary) hypertension: Secondary | ICD-10-CM | POA: Diagnosis not present

## 2017-05-01 DIAGNOSIS — M810 Age-related osteoporosis without current pathological fracture: Secondary | ICD-10-CM | POA: Diagnosis not present

## 2017-05-01 DIAGNOSIS — M170 Bilateral primary osteoarthritis of knee: Secondary | ICD-10-CM | POA: Diagnosis not present

## 2017-05-01 DIAGNOSIS — M353 Polymyalgia rheumatica: Secondary | ICD-10-CM | POA: Diagnosis not present

## 2017-05-02 ENCOUNTER — Ambulatory Visit (INDEPENDENT_AMBULATORY_CARE_PROVIDER_SITE_OTHER): Payer: Medicare Other | Admitting: Family Medicine

## 2017-05-02 ENCOUNTER — Encounter: Payer: Self-pay | Admitting: Family Medicine

## 2017-05-02 ENCOUNTER — Encounter: Payer: Self-pay | Admitting: Internal Medicine

## 2017-05-02 VITALS — BP 130/54 | HR 54 | Temp 97.8°F | Wt 209.4 lb

## 2017-05-02 DIAGNOSIS — K5731 Diverticulosis of large intestine without perforation or abscess with bleeding: Secondary | ICD-10-CM

## 2017-05-02 DIAGNOSIS — Z9181 History of falling: Secondary | ICD-10-CM

## 2017-05-02 DIAGNOSIS — M1711 Unilateral primary osteoarthritis, right knee: Secondary | ICD-10-CM

## 2017-05-02 DIAGNOSIS — D649 Anemia, unspecified: Secondary | ICD-10-CM | POA: Diagnosis not present

## 2017-05-02 LAB — CBC WITH DIFFERENTIAL/PLATELET
BASOS ABS: 60 {cells}/uL (ref 0–200)
Basophils Relative: 0.9 %
EOS PCT: 1 %
Eosinophils Absolute: 67 cells/uL (ref 15–500)
HEMATOCRIT: 35.8 % — AB (ref 38.5–50.0)
Hemoglobin: 11.8 g/dL — ABNORMAL LOW (ref 13.2–17.1)
LYMPHS ABS: 1313 {cells}/uL (ref 850–3900)
MCH: 29.9 pg (ref 27.0–33.0)
MCHC: 33 g/dL (ref 32.0–36.0)
MCV: 90.9 fL (ref 80.0–100.0)
MONOS PCT: 13.3 %
MPV: 10.6 fL (ref 7.5–12.5)
Neutro Abs: 4368 cells/uL (ref 1500–7800)
Neutrophils Relative %: 65.2 %
Platelets: 265 10*3/uL (ref 140–400)
RBC: 3.94 10*6/uL — AB (ref 4.20–5.80)
RDW: 12.6 % (ref 11.0–15.0)
Total Lymphocyte: 19.6 %
WBC mixed population: 891 cells/uL (ref 200–950)
WBC: 6.7 10*3/uL (ref 3.8–10.8)

## 2017-05-02 NOTE — Progress Notes (Signed)
Subjective:     Patient ID: Darin [name], male   DOB: 05-06-1931, 84 y.o.   MRN: [id number]  HPI Patient seen for the following issues  Recent admission with acute diverticulosis bleed. His hemoglobin at follow-up was 11.1. His diet has improved. He has good appetite. He had no further bloody stools and no abdominal pain. No dizziness. He stopped meloxicam.  Progressive right knee pain and stiffness for several years. No recent x-rays. No recent injury. Takes Tylenol with minimal relief. Tried topical diclofenac without much relief. He has early morning stiffness and increasing pain with ambulation. He specifically iquires about Synvisc injections.    He has returned to some simple driving. Friend of the family has concerns about his reaction time. He has not had any recent wrecks.  Past Medical History:  Diagnosis Date  . Arthritis   . GERD (gastroesophageal reflux disease)   . H/O hiatal hernia    "FOR YEARS" " NO PROBLEM"  . Hyperlipidemia   . Hypertension   . Knee pain   . Neuropathy    PERIPHERAL   . Osteoporosis   . PMR (polymyalgia rheumatica) (HCC)   . Polymyalgia ([place]) 5 YEARS   Past Surgical History:  Procedure Laterality Date  . CARPAL TUNNEL RELEASE    . CATARACT EXTRACTION     right  . COLONOSCOPY  2007  . INGUINAL HERNIA REPAIR  10/14/2011   Procedure: HERNIA REPAIR INGUINAL ADULT;  Surgeon: [name], MD;  Location: [place];  Service: General;  Laterality: Right;  Right Inguinal Hernia Repair with Mesh  . TONSILLECTOMY    . VARICOSE VEIN SURGERY      reports that  has never smoked. he has never used smokeless tobacco. He reports that he drinks about 1.8 oz of alcohol per week. He reports that he does not use drugs. family history includes Coronary artery disease in his unknown relative; Heart disease in his father. Allergies  Allergen Reactions  . Statins Other (See Comments)    Muscle pain and upset stomach     Review of Systems  Constitutional:  Negative for fatigue and unexpected weight change.  Eyes: Negative for visual disturbance.  Respiratory: Negative for cough, chest tightness and shortness of breath.   Cardiovascular: Negative for chest pain, palpitations and leg swelling.  Gastrointestinal: Negative for abdominal pain and blood in stool.  Neurological: Negative for dizziness, syncope, weakness, light-headedness and headaches.       Objective:   Physical Exam  Constitutional: He is oriented to person, place, and time. He appears well-developed and well-nourished.  Cardiovascular: Normal rate and regular rhythm. Exam reveals no gallop.  Pulmonary/Chest: Effort normal and breath sounds normal. No respiratory distress. He has no wheezes. He has no rales.  Abdominal: Soft. There is no tenderness.  Musculoskeletal: He exhibits no edema.  Right knee reveals increased crepitus with flexion and extension. Increased stiffness. No localized tenderness  Neurological: He is alert and oriented to person, place, and time. No cranial nerve deficit.       Assessment:     #1 recent acute lower GI bleed secondary to diverticulosis. Currently stable with no evidence for recurrent bleed  #2 primary osteoarthritis suspected right knee-minimal improvement with Tylenol and topical diclofenac  #3 high risk of falls    Plan:     -Continue home physical therapy -Set up referral to sports medicine regarding right knee pains -Continue to avoid aspirin and non-steroidals -We discussed possible occupational therapy consult to look at his  driving capability  [name] MD [facility] at [facility]

## 2017-05-02 NOTE — Addendum Note (Signed)
Addended by: Tomi Likens on: 05/02/2017 12:20 PM   Modules accepted: Orders

## 2017-05-02 NOTE — Patient Instructions (Signed)
Diverticulosis  Diverticulosis is a condition that develops when small pouches (diverticula) form in the wall of the large intestine (colon). The colon is where water is absorbed and stool is formed. The pouches form when the inside layer of the colon pushes through weak spots in the outer layers of the colon. You may have a few pouches or many of them.  What are the causes?  The cause of this condition is not known.  What increases the risk?  The following factors may make you more likely to develop this condition:   Being older than age 60. Your risk for this condition increases with age. Diverticulosis is rare among people younger than age 30. By age 80, many people have it.   Eating a low-fiber diet.   Having frequent constipation.   Being overweight.   Not getting enough exercise.   Smoking.   Taking over-the-counter pain medicines, like aspirin and ibuprofen.   Having a family history of diverticulosis.    What are the signs or symptoms?  In most people, there are no symptoms of this condition. If you do have symptoms, they may include:   Bloating.   Cramps in the abdomen.   Constipation or diarrhea.   Pain in the lower left side of the abdomen.    How is this diagnosed?  This condition is most often diagnosed during an exam for other colon problems. Because diverticulosis usually has no symptoms, it often cannot be diagnosed independently. This condition may be diagnosed by:   Using a flexible scope to examine the colon (colonoscopy).   Taking an X-ray of the colon after dye has been put into the colon (barium enema).   Doing a CT scan.    How is this treated?  You may not need treatment for this condition if you have never developed an infection related to diverticulosis. If you have had an infection before, treatment may include:   Eating a high-fiber diet. This may include eating more fruits, vegetables, and grains.   Taking a fiber supplement.   Taking a live bacteria supplement  (probiotic).   Taking medicine to relax your colon.   Taking antibiotic medicines.    Follow these instructions at home:   Drink 6-8 glasses of water or more each day to prevent constipation.   Try not to strain when you have a bowel movement.   If you have had an infection before:  ? Eat more fiber as directed by your health care provider or your diet and nutrition specialist (dietitian).  ? Take a fiber supplement or probiotic, if your health care provider approves.   Take over-the-counter and prescription medicines only as told by your health care provider.   If you were prescribed an antibiotic, take it as told by your health care provider. Do not stop taking the antibiotic even if you start to feel better.   Keep all follow-up visits as told by your health care provider. This is important.  Contact a health care provider if:   You have pain in your abdomen.   You have bloating.   You have cramps.   You have not had a bowel movement in 3 days.  Get help right away if:   Your pain gets worse.   Your bloating becomes very bad.   You have a fever or chills, and your symptoms suddenly get worse.   You vomit.   You have bowel movements that are bloody or black.   You have   bleeding from your rectum.  Summary   Diverticulosis is a condition that develops when small pouches (diverticula) form in the wall of the large intestine (colon).   You may have a few pouches or many of them.   This condition is most often diagnosed during an exam for other colon problems.   If you have had an infection related to diverticulosis, treatment may include increasing the fiber in your diet, taking supplements, or taking medicines.  This information is not intended to replace advice given to you by your health care provider. Make sure you discuss any questions you have with your health care provider.  Document Released: 12/07/2003 Document Revised: 01/29/2016 Document Reviewed: 01/29/2016  Elsevier Interactive  Patient Education  2017 Elsevier Inc.

## 2017-05-05 DIAGNOSIS — G629 Polyneuropathy, unspecified: Secondary | ICD-10-CM | POA: Diagnosis not present

## 2017-05-05 DIAGNOSIS — J449 Chronic obstructive pulmonary disease, unspecified: Secondary | ICD-10-CM | POA: Diagnosis not present

## 2017-05-05 DIAGNOSIS — I10 Essential (primary) hypertension: Secondary | ICD-10-CM | POA: Diagnosis not present

## 2017-05-05 DIAGNOSIS — M170 Bilateral primary osteoarthritis of knee: Secondary | ICD-10-CM | POA: Diagnosis not present

## 2017-05-05 DIAGNOSIS — M353 Polymyalgia rheumatica: Secondary | ICD-10-CM | POA: Diagnosis not present

## 2017-05-05 DIAGNOSIS — M810 Age-related osteoporosis without current pathological fracture: Secondary | ICD-10-CM | POA: Diagnosis not present

## 2017-05-07 DIAGNOSIS — M810 Age-related osteoporosis without current pathological fracture: Secondary | ICD-10-CM | POA: Diagnosis not present

## 2017-05-07 DIAGNOSIS — M353 Polymyalgia rheumatica: Secondary | ICD-10-CM | POA: Diagnosis not present

## 2017-05-07 DIAGNOSIS — G629 Polyneuropathy, unspecified: Secondary | ICD-10-CM | POA: Diagnosis not present

## 2017-05-07 DIAGNOSIS — M170 Bilateral primary osteoarthritis of knee: Secondary | ICD-10-CM | POA: Diagnosis not present

## 2017-05-07 DIAGNOSIS — J449 Chronic obstructive pulmonary disease, unspecified: Secondary | ICD-10-CM | POA: Diagnosis not present

## 2017-05-07 DIAGNOSIS — I10 Essential (primary) hypertension: Secondary | ICD-10-CM | POA: Diagnosis not present

## 2017-05-15 DIAGNOSIS — Z85828 Personal history of other malignant neoplasm of skin: Secondary | ICD-10-CM | POA: Diagnosis not present

## 2017-05-15 DIAGNOSIS — C44229 Squamous cell carcinoma of skin of left ear and external auricular canal: Secondary | ICD-10-CM | POA: Diagnosis not present

## 2017-05-18 NOTE — Progress Notes (Signed)
[name] [place] [place], [place] [address] Phone: [phone] Subjective:    I'm seeing this patient by the request  of:  [name], MD   CC: Right knee pain  URK:[id number]  Darin [name] is a 84 y.o. male coming in with complaint of right knee pain.  Patient has had pain for quite some time.  Has seen another provider for some years.  Continues to have discomfort and pain no noted.  Patient states that now having increasing instability and swelling.  Walking with the aid of a cane.  Patient is having aspirations of traveling in the next 4 months and feels like he would not be able to do it.  Looking for other potential treatment options and wants to avoid surgical intervention.   Patient did have x-rays taken of the tibia-fibula November 2016.  In reviewing this and mild tricompartmental arthritis of the right knee.  Also found to have vascular calcifications throughout the posterior soft tissues. He has been having pain in the joint for months now and states that the tibia and femur grind on one another. He is in pain constant. Pain is dull and achy but does have sharp pains with walking occasionally. He has had cortisone injections in the knee. He said that they do not work any more.   Past Medical History:  Diagnosis Date  . Arthritis   . GERD (gastroesophageal reflux disease)   . H/O hiatal hernia    "FOR YEARS" " NO PROBLEM"  . Hyperlipidemia   . Hypertension   . Knee pain   . Neuropathy    PERIPHERAL   . Osteoporosis   . PMR (polymyalgia rheumatica) (HCC)   . Polymyalgia ([place]) 5 YEARS   Past Surgical History:  Procedure Laterality Date  . CARPAL TUNNEL RELEASE    . CATARACT EXTRACTION     right  . COLONOSCOPY  2007  . INGUINAL HERNIA REPAIR  10/14/2011   Procedure: HERNIA REPAIR INGUINAL ADULT;  Surgeon: [name], MD;  Location: [facility];  Service: General;  Laterality: Right;  Right Inguinal Hernia Repair with Mesh    . TONSILLECTOMY    . VARICOSE VEIN SURGERY     Social History   Socioeconomic History  . Marital status: Widowed    Spouse name: Not on file  . Number of children: Not on file  . Years of education: Not on file  . Highest education level: Not on file  Social Needs  . Financial resource strain: Not on file  . Food insecurity - worry: Not on file  . Food insecurity - inability: Not on file  . Transportation needs - medical: Not on file  . Transportation needs - non-medical: Not on file  Occupational History  . Occupation: retired    Fish farm manager: RETIRED  Tobacco Use  . Smoking status: Never Smoker  . Smokeless tobacco: Never Used  Substance and Sexual Activity  . Alcohol use: Yes    Alcohol/week: 1.8 oz    Types: 3 Glasses of wine per week    Comment: half glass wine before dinner  . Drug use: No  . Sexual activity: Yes  Other Topics Concern  . Not on file  Social History Narrative  . Not on file   Allergies  Allergen Reactions  . Statins Other (See Comments)    Muscle pain and upset stomach   Family History  Problem Relation Age of Onset  . Heart disease Father   .  Coronary artery disease Unknown      Past medical history, social, surgical and family history all reviewed in electronic medical record.  No pertanent information unless stated regarding to the chief complaint.   Review of Systems:Review of systems updated and as accurate as of 05/20/17  No headache, visual changes, nausea, vomiting, diarrhea, constipation, dizziness, abdominal pain, skin rash, fevers, chills, night sweats, weight loss, swollen lymph nodes, body aches,chest pain, shortness of breath, mood changes.  Positive muscle aches and joint swelling  Objective  Blood pressure 128/62, pulse 83, height 6\' 1"  (1.854 m), weight 210 lb (95.3 kg), SpO2 95 %. Systems examined below as of 05/20/17   General: No apparent distress alert and oriented x3 mood and affect normal, dressed appropriately.   HEENT: Pupils equal, extraocular movements intact  Respiratory: Patient's speak in full sentences and does not appear short of breath  Cardiovascular: Trace lower extremity edema, non tender, no erythema  Skin: Warm dry intact with no signs of infection or rash on extremities or on axial skeleton.  Abdomen: Soft nontender  Neuro: Cranial nerves II through XII are intact, neurovascularly intact in all extremities with 2+ DTRs and 2+ pulses.  Lymph: No lymphadenopathy of posterior or anterior cervical chain or axillae bilaterally.  Gait antalgic gait MSK:  Non tender with full range of motion and good stability and symmetric strength and tone of shoulders, elbows, wrist, hip, and ankles bilaterally.  Arthritic changes of multiple joints Knee: Right valgus deformity noted. Large thigh to calf ratio.  External rotation of the tibia noted Tender to palpation over medial and PF joint line.  ROM full in flexion and extension and lower leg rotation. instability with valgus force.  painful patellar compression. Patellar glide with moderate crepitus. Patellar and quadriceps tendons unremarkable. Hamstring and quadriceps strength is normal. Contralateral knee shows contralateral knee also has some arthritic changes but no instability.  After informed written and verbal consent, patient was seated on exam table. Right knee was prepped with alcohol swab and utilizing anterolateral approach, patient's right knee space was injected with 4:1  marcaine 0.5%: Kenalog 40mg /dL. Patient tolerated the procedure well without immediate complications.    Impression and Recommendations:     This case required medical decision making of moderate complexity.      Note: This dictation was prepared with Dragon dictation along with smaller phrase technology. Any transcriptional errors that result from this process are unintentional.

## 2017-05-19 ENCOUNTER — Telehealth: Payer: Self-pay | Admitting: Family Medicine

## 2017-05-19 NOTE — Telephone Encounter (Signed)
Spoke with patient and he is waiting to hear back from Dr [employer] nurse.

## 2017-05-19 NOTE — Telephone Encounter (Signed)
Copied from [place] [id number]. Topic: Quick Communication - See Telephone Encounter >> May 19, 2017 10:15 AM Percell Belt A wrote: CRM for notification. See Telephone encounter for:  pt called in and said that he was tryn to take the tape off of his ear (pt has surgery) and cant get the last little bit off .  He is needing to know if he comes in could a nurse get it off for him?    Best number [phone]  05/19/17.

## 2017-05-19 NOTE — Telephone Encounter (Signed)
Patient is a patient at [facility]

## 2017-05-19 NOTE — Telephone Encounter (Signed)
Patient called again asking about a call back, but after further questions, it was found out that [facility] put the bandages on.  So he is going to call them to see about getting help from them to get the bandages off.

## 2017-05-20 ENCOUNTER — Ambulatory Visit (INDEPENDENT_AMBULATORY_CARE_PROVIDER_SITE_OTHER): Payer: Medicare Other | Admitting: Family Medicine

## 2017-05-20 ENCOUNTER — Encounter: Payer: Self-pay | Admitting: Family Medicine

## 2017-05-20 DIAGNOSIS — M1711 Unilateral primary osteoarthritis, right knee: Secondary | ICD-10-CM | POA: Diagnosis not present

## 2017-05-20 NOTE — Assessment & Plan Note (Signed)
Patient does have what appears to be severe arthritis.  Discussed with patient about icing regimen and home exercises.  We discussed over-the-counter medications.  Patient could be candidate for Visco supplementation.  Due to the abnormal thigh to calf ratio and instability we will get a stability in custom brace.  Follow-up again 4 weeks

## 2017-05-20 NOTE — Patient Instructions (Signed)
Good to see you  Knee arthritis Ice 20 minutes 2 times daily. Usually after activity and before bed. Gave steroid injection  Will get approval for orthovisc.  Vitamin D 2000 IU daily  Tart cherry extract any dose at night They will call you  See me again in 3 weeks.

## 2017-05-23 ENCOUNTER — Telehealth: Payer: Self-pay | Admitting: Family Medicine

## 2017-05-23 NOTE — Telephone Encounter (Signed)
Copied from [place] [id number]. Topic: Quick Communication - See Telephone Encounter >> May 23, 2017 12:51 PM Synthia Innocent wrote: CRM for notification. See Telephone encounter for:  Checking status of knee brace that Dr Tamala [name] was going to order. Please advise. Has not heard anything 05/23/17.

## 2017-05-26 NOTE — Telephone Encounter (Signed)
Message sent to [facility] to follow up with patient.

## 2017-05-29 DIAGNOSIS — Z4802 Encounter for removal of sutures: Secondary | ICD-10-CM | POA: Diagnosis not present

## 2017-06-11 NOTE — Progress Notes (Signed)
[name] [place], [place] [address] Phone: [phone] Subjective:    I'm seeing this patient by the request  of:    CC: Right knee pain follow-up  [id number]  Darin [name] is a 84 y.o. male coming in with complaint of right knee pain.  Found to have knee arthritis.  Given injection May 20, 2017.  Patient was to continue conservative therapy including home exercise, icing regimen, which activities to do which wants to avoid.  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 He said his legs could no longer hold him up and he fell. Patient said that last night he had a hard time sleeping due to sciatic nerve pain and left knee pain.      Past Medical History:  Diagnosis Date  . Arthritis   . GERD (gastroesophageal reflux disease)   . H/O hiatal hernia    "FOR YEARS" " NO PROBLEM"  . Hyperlipidemia   . Hypertension   . Knee pain   . Neuropathy    PERIPHERAL   . Osteoporosis   . PMR (polymyalgia rheumatica) (HCC)   . Polymyalgia ([place]) 5 YEARS   Past Surgical History:  Procedure Laterality Date  . CARPAL TUNNEL RELEASE    . CATARACT EXTRACTION     right  . COLONOSCOPY  2007  . INGUINAL HERNIA REPAIR  10/14/2011   Procedure: HERNIA REPAIR INGUINAL ADULT;  Surgeon: [name], MD;  Location: [facility];  Service: General;  Laterality: Right;  Right Inguinal Hernia Repair with Mesh  . TONSILLECTOMY    . VARICOSE VEIN SURGERY     Social History   Socioeconomic History  . Marital status: Widowed    Spouse name: Not on file  . Number of children: Not on file  . Years of education: Not on file  . Highest education level: Not on file  Social Needs  . Financial resource strain: Not on file  . Food insecurity - worry: Not on file  . Food insecurity - inability: Not on file  . Transportation needs - medical: Not on file  . Transportation needs - non-medical: Not on  file  Occupational History  . Occupation: retired    Fish farm manager: RETIRED  Tobacco Use  . Smoking status: Never Smoker  . Smokeless tobacco: Never Used  Substance and Sexual Activity  . Alcohol use: Yes    Alcohol/week: 1.8 oz    Types: 3 Glasses of wine per week    Comment: half glass wine before dinner  . Drug use: No  . Sexual activity: Yes  Other Topics Concern  . Not on file  Social History Narrative  . Not on file   Allergies  Allergen Reactions  . Statins Other (See Comments)    Muscle pain and upset stomach   Family History  Problem Relation Age of Onset  . Heart disease Father   . Coronary artery disease Unknown      Past medical history, social, surgical and family history all reviewed in electronic medical record.  No pertanent information unless stated regarding to the chief complaint.   Review of Systems:Review of systems updated and as accurate as of 06/11/17  No headache, visual changes, nausea, vomiting, diarrhea, constipation, dizziness, abdominal pain, skin rash, fevers, chills, night sweats, weight loss, swollen lymph nodes, body aches, joint swelling, , chest pain, shortness of breath, mood changes.  Positive joint swelling and  muscle aches  Objective  There were no vitals taken for this visit. Systems examined below as of 06/11/17   General: No apparent distress alert and oriented x3 mood and affect normal, dressed appropriately.  HEENT: Pupils equal, extraocular movements intact  Respiratory: Patient's speak in full sentences and does not appear short of breath  Cardiovascular: No lower extremity edema, non tender, no erythema  Skin: Warm dry intact with no signs of infection or rash on extremities or on axial skeleton.  Abdomen: Soft nontender  Neuro: Cranial nerves II through XII are intact, neurovascularly intact in all extremities with 2+ DTRs and 2+ pulses.  Lymph: No lymphadenopathy of posterior or anterior cervical chain or axillae bilaterally.   Gait antalgic gait MSK:  Non tender with full range of motion and good stability and symmetric strength and tone of shoulders, elbows, wrist, hip and ankles bilaterally.  Severe arthritic changes of multiple joints Knee: Right valgus deformity noted. Large thigh to calf ratio.  Tender to palpation over medial and PF joint line.  ROM full in flexion and extension and lower leg rotation. instability with valgus force.  painful patellar compression. Patellar glide with moderate crepitus. Patellar and quadriceps tendons unremarkable. Hamstring and quadriceps strength is normal. Contralateral knee shows arthritic changes as well  After informed written and verbal consent, patient was seated on exam table. Right knee was prepped with alcohol swab and utilizing anterolateral approach, patient's right knee space was injected with15 mg/2.5 mL of Orthovisc(sodium hyaluronate) in a prefilled syringe was injected easily into the knee through a 22-gauge needle..Patient tolerated the procedure well without immediate complications.     Impression and Recommendations:     This case required medical decision making of moderate complexity.      Note: This dictation was prepared with Dragon dictation along with smaller phrase technology. Any transcriptional errors that result from this process are unintentional.

## 2017-06-12 ENCOUNTER — Encounter: Payer: Self-pay | Admitting: Family Medicine

## 2017-06-12 ENCOUNTER — Ambulatory Visit (INDEPENDENT_AMBULATORY_CARE_PROVIDER_SITE_OTHER): Payer: Medicare Other | Admitting: Family Medicine

## 2017-06-12 DIAGNOSIS — M1711 Unilateral primary osteoarthritis, right knee: Secondary | ICD-10-CM | POA: Diagnosis not present

## 2017-06-12 NOTE — Assessment & Plan Note (Signed)
Failed all conservative therapy.  Visco supplementation given today.  Patient will come back in 1 week for second in a series of 4 injections.

## 2017-06-12 NOTE — Patient Instructions (Signed)
Good to see you  Darin [name] is your friend.  I will see you again next 3 weeks If back get worse we do need to consider xrays and then possible injections again.  See you soon.

## 2017-06-16 ENCOUNTER — Other Ambulatory Visit: Payer: Self-pay | Admitting: Family Medicine

## 2017-06-18 NOTE — Progress Notes (Signed)
[name] [address] [place], [place] [address] Phone: [phone] Subjective:    I'm seeing this patient by the request  of:    CC: Right knee pain follow-up  [id number]  Darin [name] is a 84 y.o. male coming in with complaint of right knee pain follow-up.  Here for second in a series of Visco supplementation injections for his knee.  Mild improvement with the first 1.  No side effects.       Past Medical History:  Diagnosis Date  . Arthritis   . GERD (gastroesophageal reflux disease)   . H/O hiatal hernia    "FOR YEARS" " NO PROBLEM"  . Hyperlipidemia   . Hypertension   . Knee pain   . Neuropathy    PERIPHERAL   . Osteoporosis   . PMR (polymyalgia rheumatica) (HCC)   . Polymyalgia ([place]) 5 YEARS   Past Surgical History:  Procedure Laterality Date  . CARPAL TUNNEL RELEASE    . CATARACT EXTRACTION     right  . COLONOSCOPY  2007  . INGUINAL HERNIA REPAIR  10/14/2011   Procedure: HERNIA REPAIR INGUINAL ADULT;  Surgeon: [name], MD;  Location: [facility];  Service: General;  Laterality: Right;  Right Inguinal Hernia Repair with Mesh  . TONSILLECTOMY    . VARICOSE VEIN SURGERY     Social History   Socioeconomic History  . Marital status: Widowed    Spouse name: Not on file  . Number of children: Not on file  . Years of education: Not on file  . Highest education level: Not on file  Occupational History  . Occupation: retired    Fish farm manager: RETIRED  Social Needs  . Financial resource strain: Not on file  . Food insecurity:    Worry: Not on file    Inability: Not on file  . Transportation needs:    Medical: Not on file    Non-medical: Not on file  Tobacco Use  . Smoking status: Never Smoker  . Smokeless tobacco: Never Used  Substance and Sexual Activity  . Alcohol use: Yes    Alcohol/week: 1.8 oz    Types: 3 Glasses of wine per week    Comment: half glass wine before dinner  . Drug use: No  . Sexual activity:  Yes  Lifestyle  . Physical activity:    Days per week: Not on file    Minutes per session: Not on file  . Stress: Not on file  Relationships  . Social connections:    Talks on phone: Not on file    Gets together: Not on file    Attends religious service: Not on file    Active member of club or organization: Not on file    Attends meetings of clubs or organizations: Not on file    Relationship status: Not on file  Other Topics Concern  . Not on file  Social History Narrative  . Not on file   Allergies  Allergen Reactions  . Statins Other (See Comments)    Muscle pain and upset stomach   Family History  Problem Relation Age of Onset  . Heart disease Father   . Coronary artery disease Unknown      Past medical history, social, surgical and family history all reviewed in electronic medical record.  No pertanent information unless stated regarding to the chief complaint.   Review of Systems:Review of systems updated and as accurate as of 06/19/17  No headache,  visual changes, nausea, vomiting, diarrhea, constipation, dizziness, abdominal pain, skin rash, fevers, chills, night sweats, weight loss, swollen lymph nodes, body aches, joint swelling, chest pain, shortness of breath, mood changes.  Positive muscle aches  Objective  Blood pressure (!) 130/58, pulse (!) 48, weight 202 lb (91.6 kg), SpO2 92 %. Systems examined below as of 06/19/17   General: No apparent distress alert and oriented x3 mood and affect normal, dressed appropriately.  Gait is antalgic with the aid of a walker Knee: Right valgus deformity noted. Large thigh to calf ratio.  Tender to palpation over medial and PF joint line.  ROM full in flexion and extension and lower leg rotation. instability with valgus force.  painful patellar compression. Patellar glide with moderate crepitus. Patellar and quadriceps tendons unremarkable. Hamstring and quadriceps strength is normal. Contralateral knee shows mild  arthritic changes but no instability  After informed written and verbal consent, patient was seated on exam table. Right knee was prepped with alcohol swab and utilizing anterolateral approach, patient's right knee space was injected with15 mg/2.5 mL of Orthovisc(sodium hyaluronate) in a prefilled syringe was injected easily into the knee through a 22-gauge needle..Patient tolerated the procedure well without immediate complications.    Impression and Recommendations:     This case required medical decision making of moderate complexity.      Note: This dictation was prepared with Dragon dictation along with smaller phrase technology. Any transcriptional errors that result from this process are unintentional.

## 2017-06-19 ENCOUNTER — Ambulatory Visit (INDEPENDENT_AMBULATORY_CARE_PROVIDER_SITE_OTHER): Payer: Medicare Other | Admitting: Family Medicine

## 2017-06-19 ENCOUNTER — Encounter: Payer: Self-pay | Admitting: Family Medicine

## 2017-06-19 DIAGNOSIS — M1711 Unilateral primary osteoarthritis, right knee: Secondary | ICD-10-CM | POA: Diagnosis not present

## 2017-06-19 NOTE — Assessment & Plan Note (Signed)
Second in a series of 4 injections given today.  Discussed icing regimen and home exercise.  Continue custom brace.  Follow-up in 1 week for third in a series of 4 injections.

## 2017-06-26 ENCOUNTER — Encounter: Payer: Self-pay | Admitting: Family Medicine

## 2017-06-26 ENCOUNTER — Ambulatory Visit (INDEPENDENT_AMBULATORY_CARE_PROVIDER_SITE_OTHER): Payer: Medicare Other | Admitting: Family Medicine

## 2017-06-26 DIAGNOSIS — M1711 Unilateral primary osteoarthritis, right knee: Secondary | ICD-10-CM

## 2017-06-26 NOTE — Progress Notes (Signed)
[name] [place], [place] [address] Phone: [phone] Subjective:     CC: Knee pain follow-up  DVV:[id number]  Darin [name] is a 84 y.o. male coming in with complaint of here for another shot in a series of Visco supplementation.  Patient states that he is feeling about the same as last visit. He does seem to ambulate quicker today.     Past Medical History:  Diagnosis Date  . Arthritis   . GERD (gastroesophageal reflux disease)   . H/O hiatal hernia    "FOR YEARS" " NO PROBLEM"  . Hyperlipidemia   . Hypertension   . Knee pain   . Neuropathy    PERIPHERAL   . Osteoporosis   . PMR (polymyalgia rheumatica) (HCC)   . Polymyalgia ([place]) 5 YEARS   Past Surgical History:  Procedure Laterality Date  . CARPAL TUNNEL RELEASE    . CATARACT EXTRACTION     right  . COLONOSCOPY  2007  . INGUINAL HERNIA REPAIR  10/14/2011   Procedure: HERNIA REPAIR INGUINAL ADULT;  Surgeon: [name], MD;  Location: [facility];  Service: General;  Laterality: Right;  Right Inguinal Hernia Repair with Mesh  . TONSILLECTOMY    . VARICOSE VEIN SURGERY     Social History   Socioeconomic History  . Marital status: Widowed    Spouse name: Not on file  . Number of children: Not on file  . Years of education: Not on file  . Highest education level: Not on file  Occupational History  . Occupation: retired    Fish farm manager: RETIRED  Social Needs  . Financial resource strain: Not on file  . Food insecurity:    Worry: Not on file    Inability: Not on file  . Transportation needs:    Medical: Not on file    Non-medical: Not on file  Tobacco Use  . Smoking status: Never Smoker  . Smokeless tobacco: Never Used  Substance and Sexual Activity  . Alcohol use: Yes    Alcohol/week: 1.8 oz    Types: 3 Glasses of wine per week    Comment: half glass wine before dinner  . Drug use: No  . Sexual activity: Yes  Lifestyle  . Physical activity:    Days per  week: Not on file    Minutes per session: Not on file  . Stress: Not on file  Relationships  . Social connections:    Talks on phone: Not on file    Gets together: Not on file    Attends religious service: Not on file    Active member of club or organization: Not on file    Attends meetings of clubs or organizations: Not on file    Relationship status: Not on file  Other Topics Concern  . Not on file  Social History Narrative  . Not on file   Allergies  Allergen Reactions  . Statins Other (See Comments)    Muscle pain and upset stomach   Family History  Problem Relation Age of Onset  . Heart disease Father   . Coronary artery disease Unknown      Past medical history, social, surgical and family history all reviewed in electronic medical record.  No pertanent information unless stated regarding to the chief complaint.   Review of Systems:Review of systems updated and as accurate as of 06/26/17  No headache, visual changes, nausea, vomiting, diarrhea, constipation, dizziness, abdominal pain, skin rash, fevers, chills,  night sweats, weight loss, swollen lymph nodes, body aches, joint swelling, muscle aches, chest pain, shortness of breath, mood changes.   Objective  Blood pressure (!) 148/52, pulse 81, height 6\' 1"  (1.854 m), weight 202 lb (91.6 kg), SpO2 93 %. Systems examined below as of 06/26/17   Knee: valgus deformity noted. Large thigh to calf ratio.  Tender to palpation over medial and PF joint line.  ROM full in flexion and extension and lower leg rotation. instability with valgus force.  painful patellar compression. Patellar glide with moderate crepitus. Patellar and quadriceps tendons unremarkable. Hamstring and quadriceps strength is normal. Contralateral knee shows mild arthritic changes but no pain. Continues to ambulate with the aid of a walker  After informed written and verbal consent, patient was seated on exam table. Right knee was prepped with  alcohol swab and utilizing anterolateral approach, patient's right knee space was injected with15 mg/2.5 mL of Orthovisc(sodium hyaluronate) in a prefilled syringe was injected easily into the knee through a 22-gauge needle..Patient tolerated the procedure well without immediate complications.    Impression and Recommendations:     This case required medical decision making of moderate complexity.      Note: This dictation was prepared with Dragon dictation along with smaller phrase technology. Any transcriptional errors that result from this process are unintentional.

## 2017-06-26 NOTE — Patient Instructions (Signed)
Good to see you 3 down one to go! See me again in 1 week 

## 2017-06-26 NOTE — Assessment & Plan Note (Signed)
Third in a series of 4 injections for Visco supplementation given today.  Follow-up in 1 week for fourth and final injection.  Patient has a trip at the end of May.

## 2017-07-03 ENCOUNTER — Encounter: Payer: Self-pay | Admitting: Family Medicine

## 2017-07-03 ENCOUNTER — Ambulatory Visit (INDEPENDENT_AMBULATORY_CARE_PROVIDER_SITE_OTHER): Payer: Medicare Other | Admitting: Family Medicine

## 2017-07-03 VITALS — BP 160/64 | HR 90 | Ht 73.0 in | Wt 205.0 lb

## 2017-07-03 DIAGNOSIS — M79604 Pain in right leg: Secondary | ICD-10-CM | POA: Diagnosis not present

## 2017-07-03 DIAGNOSIS — M1711 Unilateral primary osteoarthritis, right knee: Secondary | ICD-10-CM

## 2017-07-03 NOTE — Patient Instructions (Signed)
Good to see you  Ice is your friend COnitnue the brace I do expect it to continue to improve We will get tests to check blood flow.  Continue the same medicines See me again in 4-5 weeks and we will make sure we have you ready for your trip

## 2017-07-03 NOTE — Assessment & Plan Note (Signed)
Fourth and final Visco supplementation injection given today.  Continues to have radicular symptoms.  Seems to have more of a polyneuropathy.  Will check vascular flow with ABI to make sure were not missing anything else.  Patient will follow up with me again in 4 weeks

## 2017-07-03 NOTE — Progress Notes (Signed)
[name] [name], [name] [address] Phone: [phone] Subjective:    I'm seeing this patient by the request  of:    CC: Knee pain follow-up  HMC:[id number]  Darin [name] is a 84 y.o. male coming in with complaint of knee pain.  Right-sided severe arthritis.  Here for fourth and final Visco supplementation injection.     Past Medical History:  Diagnosis Date  . Arthritis   . GERD (gastroesophageal reflux disease)   . H/O hiatal hernia    "FOR YEARS" " NO PROBLEM"  . Hyperlipidemia   . Hypertension   . Knee pain   . Neuropathy    PERIPHERAL   . Osteoporosis   . PMR (polymyalgia rheumatica) (HCC)   . Polymyalgia ([facility]) 5 YEARS   Past Surgical History:  Procedure Laterality Date  . CARPAL TUNNEL RELEASE    . CATARACT EXTRACTION     right  . COLONOSCOPY  2007  . INGUINAL HERNIA REPAIR  10/14/2011   Procedure: HERNIA REPAIR INGUINAL ADULT;  Surgeon: Darin Bowie, MD;  Location: [facility];  Service: General;  Laterality: Right;  Right Inguinal Hernia Repair with Mesh  . TONSILLECTOMY    . VARICOSE VEIN SURGERY     Social History   Socioeconomic History  . Marital status: Widowed    Spouse name: Not on file  . Number of children: Not on file  . Years of education: Not on file  . Highest education level: Not on file  Occupational History  . Occupation: retired    Fish farm manager: RETIRED  Social Needs  . Financial resource strain: Not on file  . Food insecurity:    Worry: Not on file    Inability: Not on file  . Transportation needs:    Medical: Not on file    Non-medical: Not on file  Tobacco Use  . Smoking status: Never Smoker  . Smokeless tobacco: Never Used  Substance and Sexual Activity  . Alcohol use: Yes    Alcohol/week: 1.8 oz    Types: 3 Glasses of wine per week    Comment: half glass wine before dinner  . Drug use: No  . Sexual activity: Yes  Lifestyle  . Physical activity:    Days per week: Not on file     Minutes per session: Not on file  . Stress: Not on file  Relationships  . Social connections:    Talks on phone: Not on file    Gets together: Not on file    Attends religious service: Not on file    Active member of club or organization: Not on file    Attends meetings of clubs or organizations: Not on file    Relationship status: Not on file  Other Topics Concern  . Not on file  Social History Narrative  . Not on file   Allergies  Allergen Reactions  . Statins Other (See Comments)    Muscle pain and upset stomach   Family History  Problem Relation Age of Onset  . Heart disease Father   . Coronary artery disease Unknown      Past medical history, social, surgical and family history all reviewed in electronic medical record.  No pertanent information unless stated regarding to the chief complaint.   Review of Systems:Review of systems updated and as accurate as of 07/03/17  No headache, visual changes, nausea, vomiting, diarrhea, constipation, dizziness, abdominal pain, skin rash, fevers, chills, night sweats, weight loss,  swollen lymph nodes, body aches,  chest pain, shortness of breath, mood changes.  Positive muscle aches and joint swelling  Objective  Blood pressure (!) 160/64, pulse 90, height 6\' 1"  (1.854 m), weight 205 lb (93 kg), SpO2 96 %. Systems examined below as of 07/03/17   General: No apparent distress alert and oriented x3 mood and affect normal, dressed appropriately.  Gait improved from last visit now using the interval cane MSK: Arthritic changes of multiple joints. Knee: Right valgus deformity noted. Large thigh to calf ratio.  Tender to palpation over medial and PF joint line.  ROM full in flexion and extension and lower leg rotation. instability with valgus force.  painful patellar compression. Patellar glide with moderate crepitus. Patellar and quadriceps tendons unremarkable. Hamstring and quadriceps strength is normal. Contralateral knee  shows mild arthritic changes but no instability  After informed written and verbal consent, patient was seated on exam table. Right knee was prepped with alcohol swab and utilizing anterolateral approach, patient's right knee space was injected with15 mg/2.5 mL of Orthovisc(sodium hyaluronate) in a prefilled syringe was injected easily into the knee through a 22-gauge needle..Patient tolerated the procedure well without immediate complications.     Impression and Recommendations:     This case required medical decision making of moderate complexity.      Note: This dictation was prepared with Dragon dictation along with smaller phrase technology. Any transcriptional errors that result from this process are unintentional.

## 2017-07-04 ENCOUNTER — Encounter: Payer: Self-pay | Admitting: Podiatry

## 2017-07-04 ENCOUNTER — Ambulatory Visit (INDEPENDENT_AMBULATORY_CARE_PROVIDER_SITE_OTHER): Payer: Medicare Other | Admitting: Podiatry

## 2017-07-04 DIAGNOSIS — M79674 Pain in right toe(s): Secondary | ICD-10-CM

## 2017-07-04 DIAGNOSIS — B351 Tinea unguium: Secondary | ICD-10-CM | POA: Diagnosis not present

## 2017-07-04 DIAGNOSIS — M79675 Pain in left toe(s): Secondary | ICD-10-CM | POA: Diagnosis not present

## 2017-07-04 DIAGNOSIS — G609 Hereditary and idiopathic neuropathy, unspecified: Secondary | ICD-10-CM

## 2017-07-04 NOTE — Progress Notes (Signed)
Complaint:  Visit Type: Patient returns to my office for continued preventative foot care services. Complaint: Patient states" my nails have grown long and thick and become painful to walk and wear shoes" Patient has been diagnosed with neuropathy.. The patient presents for preventative foot care services. No changes to ROS  Podiatric Exam: Vascular: dorsalis pedis and posterior tibial pulses are palpable bilateral. Capillary return is immediate. Temperature gradient is WNL. Skin turgor WNL  Sensorium: Normal Semmes Weinstein monofilament test. Normal tactile sensation bilaterally. Nail Exam: Pt has thick disfigured discolored nails with subungual debris noted bilateral entire nail hallux through fifth toenails Ulcer Exam: There is no evidence of ulcer or pre-ulcerative changes or infection. Orthopedic Exam: Muscle tone and strength are WNL. No limitations in general ROM. No crepitus or effusions noted. Foot type and digits show no abnormalities. Bony prominences are unremarkable. Skin: No Porokeratosis. No infection or ulcers  Diagnosis:  Onychomycosis, , Pain in right toe, pain in left toes  Treatment & Plan Procedures and Treatment: Consent by patient was obtained for treatment procedures. The patient understood the discussion of treatment and procedures well. All questions were answered thoroughly reviewed. Debridement of mycotic and hypertrophic toenails, 1 through 5 bilateral and clearing of subungual debris. No ulceration, no infection noted.  Return Visit-Office Procedure: Patient instructed to return to the office for a follow up visit 3 months for continued evaluation and treatment.    Naelle Diegel DPM 

## 2017-07-07 DIAGNOSIS — L308 Other specified dermatitis: Secondary | ICD-10-CM | POA: Diagnosis not present

## 2017-07-07 DIAGNOSIS — Z85828 Personal history of other malignant neoplasm of skin: Secondary | ICD-10-CM | POA: Diagnosis not present

## 2017-07-08 ENCOUNTER — Ambulatory Visit (HOSPITAL_COMMUNITY)
Admission: RE | Admit: 2017-07-08 | Discharge: 2017-07-08 | Disposition: A | Discharge status: Home / Self Care | Payer: Medicare Other | Source: Ambulatory Visit | Attending: Cardiology | Admitting: Cardiology

## 2017-07-08 DIAGNOSIS — M79604 Pain in right leg: Secondary | ICD-10-CM

## 2017-07-09 ENCOUNTER — Ambulatory Visit (HOSPITAL_COMMUNITY)
Admission: RE | Admit: 2017-07-09 | Discharge: 2017-07-09 | Disposition: A | Discharge status: Home / Self Care | Payer: Medicare Other | Source: Ambulatory Visit | Attending: Cardiology | Admitting: Cardiology

## 2017-07-09 DIAGNOSIS — M79604 Pain in right leg: Secondary | ICD-10-CM | POA: Diagnosis not present

## 2017-07-09 DIAGNOSIS — I739 Peripheral vascular disease, unspecified: Secondary | ICD-10-CM | POA: Insufficient documentation

## 2017-07-10 ENCOUNTER — Telehealth: Payer: Self-pay | Admitting: *Deleted

## 2017-07-10 DIAGNOSIS — I739 Peripheral vascular disease, unspecified: Secondary | ICD-10-CM

## 2017-07-10 NOTE — Telephone Encounter (Signed)
-----   Message from Lyndal Pulley, DO sent at 07/10/2017  9:38 AM EDT ----- Please refer to vascular please

## 2017-07-22 ENCOUNTER — Other Ambulatory Visit: Payer: Self-pay

## 2017-07-22 DIAGNOSIS — I739 Peripheral vascular disease, unspecified: Secondary | ICD-10-CM

## 2017-08-05 ENCOUNTER — Other Ambulatory Visit: Payer: Self-pay | Admitting: Family Medicine

## 2017-08-11 NOTE — Progress Notes (Signed)
[name] [place], [place] [address] Phone: [phone] Subjective:     CC: Left leg pain  IPJ:[id number]  Darin [name] is a 84 y.o. male coming in with complaint of left leg pain. He has had an increase in pain since last visit. He said that he is is constant pain that keeps him up at night. His pain is in his calf but radiates into the foot and knee. He states that he is struggling to walk due to the pain as well. He is going on a trip to [place] next week Wednesday.  Patient was seen for me for more of a right knee arthritis and states that the knee and leg seems to be doing better on the right side.   Patient's work-up also recently has shown that there is a possibility for peripheral vascular disease and has been referred to vascular who is seeing him on Thursday. Past Medical History:  Diagnosis Date  . Arthritis   . GERD (gastroesophageal reflux disease)   . H/O hiatal hernia    "FOR YEARS" " NO PROBLEM"  . Hyperlipidemia   . Hypertension   . Knee pain   . Neuropathy    PERIPHERAL   . Osteoporosis   . PMR (polymyalgia rheumatica) (HCC)   . Polymyalgia ([facility]) 5 YEARS   Past Surgical History:  Procedure Laterality Date  . CARPAL TUNNEL RELEASE    . CATARACT EXTRACTION     right  . COLONOSCOPY  2007  . INGUINAL HERNIA REPAIR  10/14/2011   Procedure: HERNIA REPAIR INGUINAL ADULT;  Surgeon: [name], MD;  Location: [facility];  Service: General;  Laterality: Right;  Right Inguinal Hernia Repair with Mesh  . TONSILLECTOMY    . VARICOSE VEIN SURGERY     Social History   Socioeconomic History  . Marital status: Widowed    Spouse name: Not on file  . Number of children: Not on file  . Years of education: Not on file  . Highest education level: Not on file  Occupational History  . Occupation: retired    Fish farm manager: RETIRED  Social Needs  . Financial resource strain: Not on file  . Food insecurity:    Worry: Not on file      Inability: Not on file  . Transportation needs:    Medical: Not on file    Non-medical: Not on file  Tobacco Use  . Smoking status: Never Smoker  . Smokeless tobacco: Never Used  Substance and Sexual Activity  . Alcohol use: Yes    Alcohol/week: 1.8 oz    Types: 3 Glasses of wine per week    Comment: half glass wine before dinner  . Drug use: No  . Sexual activity: Yes  Lifestyle  . Physical activity:    Days per week: Not on file    Minutes per session: Not on file  . Stress: Not on file  Relationships  . Social connections:    Talks on phone: Not on file    Gets together: Not on file    Attends religious service: Not on file    Active member of club or organization: Not on file    Attends meetings of clubs or organizations: Not on file    Relationship status: Not on file  Other Topics Concern  . Not on file  Social History Narrative  . Not on file   Allergies  Allergen Reactions  . Statins Other (See Comments)  Muscle pain and upset stomach   Family History  Problem Relation Age of Onset  . Heart disease Father   . Coronary artery disease Unknown      Past medical history, social, surgical and family history all reviewed in electronic medical record.  No pertanent information unless stated regarding to the chief complaint.   Review of Systems:Review of systems updated and as accurate as of 08/12/17  No headache, visual changes, nausea, vomiting, diarrhea, constipation, dizziness, abdominal pain, skin rah, fevers, chills, night sweats, weight loss, swollen lymph nodes,  chest pain, shortness of breath, mood changes.  Positive muscle aches, body aches  Objective  Blood pressure 130/60, pulse 91, height 6\' 1"  (1.854 m), weight 202 lb (91.6 kg), SpO2 98 %. Systems examined below as of 08/12/17   General: No apparent distress alert and oriented x3 mood and affect normal, dressed appropriately.  HEENT: Pupils equal, extraocular movements intact  Respiratory:  Patient's speak in full sentences and does not appear short of breath  Cardiovascular: No lower extremity edema, non tender, no erythema  Skin: Warm dry intact with no signs of infection or rash on extremities or on axial skeleton.  Abdomen: Soft nontender  Neuro: Cranial nerves II through XII are intact, neurovascularly intact in all extremities with 2+ DTRs and 2+ pulses.  Lymph: No lymphadenopathy of posterior or anterior cervical chain or axillae bilaterally.  Gait antalgic MSK:  Non tender with full range of motion and good stability and symmetric strength and tone of shoulders, elbows, wrist, hip, and ankles bilaterally.  Significant arthritic changes of multiple joints Back Exam:  Inspection: Unremarkable  Motion: Flexion 40 deg, Extension 15 deg, Side Bending to 45 deg bilaterally,  Rotation to 35 deg bilaterally  SLR laying: Negative  XSLR laying: Negative  Palpable tenderness: Tender to palpation the paraspinal musculature lumbar spine right greater than left. FABER: Positive Faber. Sensory change: Gross sensation intact to all lumbar and sacral dermatomes.  Reflexes: 2+ at both patellar tendons, 2+ at achilles tendons, Babinski's downgoing.  Strength at foot  Plantar-flexion: 5/5 Dorsi-flexion: 5/5 Eversion: 5/5 Inversion: 5/5  Leg strength  Quad: 5/5 Hamstring: 5/5 Hip flexor: 5/5 Hip abductors: 4/5 but symmetric  Lower leg exam of the left shows the patient does have some redness to the skin.  Mildly warm to touch.  Consistent with potential cellulitis.   Impression and Recommendations:     This case required medical decision making of moderate complexity.      Note: This dictation was prepared with Dragon dictation along with smaller phrase technology. Any transcriptional errors that result from this process are unintentional.

## 2017-08-12 ENCOUNTER — Encounter: Payer: Self-pay | Admitting: Family Medicine

## 2017-08-12 ENCOUNTER — Ambulatory Visit (INDEPENDENT_AMBULATORY_CARE_PROVIDER_SITE_OTHER): Payer: Medicare Other | Admitting: Family Medicine

## 2017-08-12 DIAGNOSIS — L03116 Cellulitis of left lower limb: Secondary | ICD-10-CM | POA: Diagnosis not present

## 2017-08-12 MED ORDER — DOXYCYCLINE HYCLATE 100 MG PO TABS: 100.0000 mg | ORAL_TABLET | Freq: Two times a day (BID) | ORAL | 0 refills | Status: AC

## 2017-08-12 NOTE — Assessment & Plan Note (Signed)
Patient does have redness noted of the posterior aspect of the calf.  Mild tender to palpation in the area but a negative squeeze test.  I am not as concerned with a blood clot at this point but will be traveling and will be started on full dose aspirin.  Patient is seen vascular for his abnormal ABIs.  Patient was started on antibiotics secondary to him traveling here soon to further broaden coverage with doxycycline and warned of potential side effects.  We discussed compression otherwise for this trace amount of swelling that is noted.  Follow-up with me again when patient returns from his trip.

## 2017-08-12 NOTE — Patient Instructions (Signed)
Good to see you  I think you have a cellulitis. Start doxycycline 2 times a day for next 2 weeks at least but gave you enough for 3 weeks Increase aspirin to 325mg  daily 5 days before you travel.  See vascular and see there recommendation  See me again when you return if you need me

## 2017-08-13 ENCOUNTER — Telehealth: Payer: Self-pay

## 2017-08-13 MED ORDER — CEPHALEXIN 500 MG PO CAPS: 500.0000 mg | ORAL_CAPSULE | Freq: Two times a day (BID) | ORAL | 0 refills | Status: DC

## 2017-08-13 NOTE — Telephone Encounter (Signed)
Error. Recorded in another note.

## 2017-08-13 NOTE — Telephone Encounter (Signed)
Spoke with patient who said that he feels that the infection is getting worse and traveling up his leg. He would like Keflex called in as his daughter who is a pharmacist recommended that he take this medication. Told him I would speak with Dr. Tamala [name] first.

## 2017-08-13 NOTE — Telephone Encounter (Signed)
Left patient a message to call back to discuss medications. Dr. Tamala [name] does not feel that Keflex will help and recommends that patient continue with current medication at this time.

## 2017-08-13 NOTE — Telephone Encounter (Signed)
Attempted to discuss with patient again.  Patient is adamant that he feels he needs to Keflex.  We discussed that if patient is having worsening symptoms with the doxycycline that if we were trying to broaden the medications we would continue with possibly doxycycline and ciprofloxacin.  Patient does not want that and wants to Keflex itself.  I also discussed with him that if any significant worsening I would like patient to be seen in the emergency room because we would need to rule out any deep venous thrombosis or potential for the worsening aspect of the redness that was noted at exam.  Patient denies any type of fevers.  Declined multiple times about going to the emergency room.  We discussed with him that it would be very difficult to see him tomorrow but I have another provider in the office.  Patient will consider it may be.  We will see how patient responds to the medication.  Once again patient is leaving the country in the near future.  Extended conversation when discussing patient should go to the emergency room if worsening pain  Keflex sent in due to patient preference encouraged him to continue the doxycycline as well and

## 2017-08-13 NOTE — Telephone Encounter (Signed)
Copied from [facility] [phone]. Topic: Quick Communication - See Telephone Encounter >> Aug 13, 2017  9:04 AM Antonieta Iba C wrote: CRM for notification. See Telephone encounter for: 08/13/17.   Pt has cellulitis and have a Rx for doxycycline (VIBRA-TABS) 100 MG tablet, pt says that medication is not helping, pt would like to have Keflex instead   Pharmacy: [facility], [place]. [phone] (Phone) [phone] (Fax)

## 2017-08-14 ENCOUNTER — Ambulatory Visit (HOSPITAL_COMMUNITY)
Admission: RE | Admit: 2017-08-14 | Discharge: 2017-08-14 | Disposition: A | Discharge status: Home / Self Care | Payer: Medicare Other | Source: Ambulatory Visit | Attending: Vascular Surgery | Admitting: Vascular Surgery

## 2017-08-14 ENCOUNTER — Other Ambulatory Visit: Payer: Self-pay

## 2017-08-14 ENCOUNTER — Ambulatory Visit (INDEPENDENT_AMBULATORY_CARE_PROVIDER_SITE_OTHER): Payer: Medicare Other | Admitting: Vascular Surgery

## 2017-08-14 ENCOUNTER — Encounter: Payer: Self-pay | Admitting: Vascular Surgery

## 2017-08-14 VITALS — BP 141/72 | HR 70 | Resp 20 | Ht 73.0 in | Wt 202.1 lb

## 2017-08-14 DIAGNOSIS — I739 Peripheral vascular disease, unspecified: Secondary | ICD-10-CM

## 2017-08-14 DIAGNOSIS — I872 Venous insufficiency (chronic) (peripheral): Secondary | ICD-10-CM

## 2017-08-14 NOTE — Progress Notes (Signed)
Patient name: Darin [name] MRN: [id number] DOB: January 27, 1932 Sex: male   REASON FOR CONSULT:    Peripheral vascular disease.  The consult is requested by Dr. Tamala [name].  HPI:   Darin [name] is a pleasant 84 y.o. male, who was referred for evaluation of peripheral vascular disease.  I reviewed the records that were sent from the referring office.  The patient was seen by Dr. Tamala [name] on 08/12/2017.  The patient was complaining of left leg pain.  He did have some mild cellulitis at that time and was started on doxycycline for 2 weeks.  Of note the patient did have a right lower extremity venous duplex scan on 07/08/2017 which showed no evidence of DVT.  The patient states that he developed some skin discoloration of the distal medial left leg about a week ago.  He was started on doxycycline which is not helping so he was just recently started on Keflex.  He denies any previous history of DVT or phlebitis.  It does sound like he had laser ablation of the saphenous veins bilaterally by Dr. Sherren Mocha Early many years ago.  He denies any history of claudication, rest pain, or nonhealing ulcers.  Of note he is leaving next week for a trip to [place] which is a [place] [employer].  His risk factors for peripheral vascular disease include hypertension and a family history of premature cardiovascular disease.  He denies any history of diabetes, hypercholesterolemia, or history of tobacco use.  Past Medical History:  Diagnosis Date  . Arthritis   . GERD (gastroesophageal reflux disease)   . H/O hiatal hernia    "FOR YEARS" " NO PROBLEM"  . Hyperlipidemia   . Hypertension   . Knee pain   . Neuropathy    PERIPHERAL   . Osteoporosis   . PMR (polymyalgia rheumatica) (HCC)   . Polymyalgia ([place]) 5 YEARS    Family History  Problem Relation Age of Onset  . Heart disease Father   . Coronary artery disease Unknown     SOCIAL HISTORY: Social History   Socioeconomic History  . Marital status:  Widowed    Spouse name: Not on file  . Number of children: Not on file  . Years of education: Not on file  . Highest education level: Not on file  Occupational History  . Occupation: retired    Fish farm manager: RETIRED  Social Needs  . Financial resource strain: Not on file  . Food insecurity:    Worry: Not on file    Inability: Not on file  . Transportation needs:    Medical: Not on file    Non-medical: Not on file  Tobacco Use  . Smoking status: Never Smoker  . Smokeless tobacco: Never Used  Substance and Sexual Activity  . Alcohol use: Yes    Alcohol/week: 1.8 oz    Types: 3 Glasses of wine per week    Comment: half glass wine before dinner  . Drug use: No  . Sexual activity: Yes  Lifestyle  . Physical activity:    Days per week: Not on file    Minutes per session: Not on file  . Stress: Not on file  Relationships  . Social connections:    Talks on phone: Not on file    Gets together: Not on file    Attends religious service: Not on file    Active member of club or organization: Not on file    Attends meetings of clubs or organizations: Not  on file    Relationship status: Not on file  . Intimate partner violence:    Fear of current or ex partner: Not on file    Emotionally abused: Not on file    Physically abused: Not on file    Forced sexual activity: Not on file  Other Topics Concern  . Not on file  Social History Narrative  . Not on file    Allergies  Allergen Reactions  . Statins Other (See Comments)    Muscle pain and upset stomach    Current Outpatient Medications  Medication Sig Dispense Refill  . amLODipine (NORVASC) 5 MG tablet TAKE 1 TABLET DAILY 90 tablet 3  . aspirin 325 MG tablet Take 325 mg by mouth daily.    . benazepril-hydrochlorthiazide (LOTENSIN HCT) 10-12.5 MG tablet TAKE 1 TABLET DAILY        *PATIENT NEEDS AN          APPOINTMENT* 90 tablet 0  . cephALEXin (KEFLEX) 500 MG capsule Take 1 capsule (500 mg total) by mouth 2 (two) times daily.  14 capsule 0  . diclofenac sodium (VOLTAREN) 1 % GEL Apply 2 g topically 4 (four) times daily. 100 g 3  . doxycycline (VIBRA-TABS) 100 MG tablet Take 1 tablet (100 mg total) by mouth 2 (two) times daily for 21 days. 42 tablet 0  . famotidine (PEPCID) 20 MG tablet TAKE 1 TABLET AT BEDTIME 90 tablet 0  . finasteride (PROSCAR) 5 MG tablet TAKE 1 TABLET EACH DAY. 90 tablet 0  . gabapentin (NEURONTIN) 100 MG capsule TAKE 1 CAPSULE 4 TIMES     DAILY 360 capsule 1  . niacin 500 MG tablet Take 500 mg by mouth 2 (two) times daily with a meal.     . polyethylene glycol (MIRALAX / GLYCOLAX) packet Take 17 g by mouth daily as needed for moderate constipation.    . vitamin C (ASCORBIC ACID) 500 MG tablet Take 500 mg by mouth daily.     No current facility-administered medications for this visit.     REVIEW OF SYSTEMS:  [X]  denotes positive finding, [ ]  denotes negative finding Cardiac  Comments:  Chest pain or chest pressure:    Shortness of breath upon exertion:    Short of breath when lying flat:    Irregular heart rhythm:        Vascular    Pain in calf, thigh, or hip brought on by ambulation: x   Pain in feet at night that wakes you up from your sleep:     Blood clot in your veins:    Leg swelling:         Pulmonary    Oxygen at home:    Productive cough:     Wheezing:         Neurologic    Sudden weakness in arms or legs:     Sudden numbness in arms or legs:     Sudden onset of difficulty speaking or slurred speech:    Temporary loss of vision in one eye:     Problems with dizziness:         Gastrointestinal    Blood in stool:     Vomited blood:         Genitourinary    Burning when urinating:     Blood in urine:        Psychiatric    Major depression:         Hematologic    Bleeding  problems:    Problems with blood clotting too easily:        Skin    Rashes or ulcers:        Constitutional    Fever or chills:     PHYSICAL EXAM:   Vitals:   08/14/17 1305  08/14/17 1308  BP: (!) 145/73 (!) 141/72  Pulse: 70   Resp: 20   Weight: 202 lb 1.6 oz (91.7 kg)   Height: 6\' 1"  (1.854 m)     GENERAL: The patient is a well-nourished male, in no acute distress. The vital signs are documented above. CARDIAC: There is a regular rate and rhythm.  VASCULAR: I do not detect carotid bruits. He has normal femoral pulses.  I cannot palpate popliteal or pedal pulses. On the right side he has a biphasic anterior tibial signal and a monophasic posterior tibial signal.  I cannot obtain a dorsalis pedis signal. On the left side he has a biphasic dorsalis pedis signal and a monophasic posterior tibial signal. He has hyperpigmentation bilaterally consistent with chronic venous insufficiency with mild bilateral lower extremity swelling.  His skin changes are more significant on the left leg which involve the distal medial left leg and also the posterior calf and extend up fairly high.  He has no open ulcers. The patient has significant varicose veins and some reticular veins bilaterally.  In the left leg especially has a large cluster of varicose veins along the anterior lateral aspect of the thigh extending onto the lateral aspect of the left leg. PULMONARY: There is good air exchange bilaterally without wheezing or rales. ABDOMEN: Soft and non-tender with normal pitched bowel sounds.  MUSCULOSKELETAL: There are no major deformities or cyanosis. NEUROLOGIC: No focal weakness or paresthesias are detected. SKIN: There are no ulcers or rashes noted. PSYCHIATRIC: The patient has a normal affect.  DATA:    ARTERIAL DOPPLER STUDY: I reviewed the arterial Doppler study that was done elsewhere on 07/09/2017.  On the right side, there is a triphasic dorsalis pedis and posterior tibial signal.  ABI is 100%.  Toe pressure on the right is 108 mmHg.  On the left side, there is a biphasic dorsalis pedis and posterior tibial signal.  ABI is 91%.  Toe pressure on the left is 84  mmHg.  ARTERIAL DUPLEX LEFT LOWER EXTREMITY: I have independently interpreted the duplex of the left lower extremity today.  This shows biphasic and triphasic waveforms throughout the left lower extremity to the level of the tibial vessels.  There is a monophasic anterior tibial signal and peroneal signal.  The posterior tibial artery could not be visualized.  MEDICAL ISSUES:   CHRONIC VENOUS INSUFFICIENCY: Based on his exam he has evidence of significant chronic venous insufficiency.  He has large varicose veins bilaterally and hyperpigmentation consistent with chronic venous insufficiency.  We have discussed the importance of intermittent leg elevation and the proper positioning for this.  I have also written him a prescription for knee-high compression stockings with a gradient of 15 to 20 mmHg.  I have encouraged him to wear these on his long flight to [place].  We have also discussed the importance of staying hydrated on his flight and walking on the flight as much as possible.  I have encouraged him to avoid prolonged sitting and standing.  When he returns I think he should consider water aerobics which is very helpful for patients with chronic venous insufficiency.  I be happy to evaluate him when he returns  if his venous symptoms progress.  He would need a formal venous reflux study to further evaluate his options.  TIBIAL ARTERY OCCLUSIVE DISEASE: The patient does have evidence of tibial artery occlusive disease bilaterally.  However he has a biphasic Doppler signal in each foot and I think his circulation is adequate.  I do not think this will interfere with his treatment of his venous insufficiency which includes elevation and compression.  Fortunately he is not a smoker.  He is on aspirin.  Deitra Mayo [facility] [phone]

## 2017-09-02 ENCOUNTER — Telehealth: Payer: Self-pay | Admitting: Family Medicine

## 2017-09-02 NOTE — Telephone Encounter (Signed)
May refill Celebrex 200 mg po qd #30 with no refills.

## 2017-09-02 NOTE — Telephone Encounter (Signed)
Copied from [place] [id number]. Topic: General - Other >> Sep 02, 2017  1:59 PM Oneta Rack wrote: Relation to pt: self  Call back number:[phone] Pharmacy: [facility], [place] [phone] (Phone) [phone] (Fax)   Reason for call:  Patient [place] ankle [place]  08[place]27/17, x ray determined sprain only, celebrex 200 mg capsule was prescribed, patient will run out shortly requesting PCP refill, patient declined appointment, please advise >> Sep 02, 2017  2:06 PM Oneta Rack wrote: Relation to pt: self  Call back [facility]: [place], [place]. [phone] (Phone) [phone] (Fax)   Reason for call:  Patient experienced [place]rain ankle while [place] country on  08[place]27/17, x ray determined sprain only, celebrex 200 mg capsule was prescribed, patient will run out shortly requesting PCP refill, patient declined appointment, please advise

## 2017-09-03 MED ORDER — CELECOXIB 200 MG PO CAPS: 200.0000 mg | ORAL_CAPSULE | Freq: Every day | ORAL | 0 refills | Status: DC

## 2017-09-03 NOTE — Telephone Encounter (Signed)
Rx sent 

## 2017-09-08 ENCOUNTER — Encounter: Payer: Self-pay | Admitting: Family Medicine

## 2017-09-08 ENCOUNTER — Ambulatory Visit (INDEPENDENT_AMBULATORY_CARE_PROVIDER_SITE_OTHER): Payer: Medicare Other | Admitting: Family Medicine

## 2017-09-08 VITALS — BP 118/58 | HR 78 | Temp 98.4°F | Wt 200.0 lb

## 2017-09-08 DIAGNOSIS — L03031 Cellulitis of right toe: Secondary | ICD-10-CM

## 2017-09-08 MED ORDER — CEPHALEXIN 500 MG PO CAPS: 500.0000 mg | ORAL_CAPSULE | Freq: Four times a day (QID) | ORAL | 0 refills | Status: DC

## 2017-09-08 NOTE — Patient Instructions (Signed)

## 2017-09-08 NOTE — Progress Notes (Signed)
  Subjective:     Patient ID: Darin [name], male   DOB: 01-18-1932, 84 y.o.   MRN: [id number]  HPI Patient seen with right great toe pain and some redness and mild swelling. Has had also some pain on dorsum of foot between the second third toes well. He first noticed some redness night before last and has had some mild progression redness to the distal foot dorsally. No history of gout. No known injury. No fevers or chills.  Past Medical History:  Diagnosis Date  . Arthritis   . GERD (gastroesophageal reflux disease)   . H/O hiatal hernia    "FOR YEARS" " NO PROBLEM"  . Hyperlipidemia   . Hypertension   . Knee pain   . Neuropathy    PERIPHERAL   . Osteoporosis   . PMR (polymyalgia rheumatica) (HCC)   . Polymyalgia ([place]) 5 YEARS   Past Surgical History:  Procedure Laterality Date  . CARPAL TUNNEL RELEASE    . CATARACT EXTRACTION     right  . COLONOSCOPY  2007  . INGUINAL HERNIA REPAIR  10/14/2011   Procedure: HERNIA REPAIR INGUINAL ADULT;  Surgeon: [name], MD;  Location: [facility];  Service: General;  Laterality: Right;  Right Inguinal Hernia Repair with Mesh  . TONSILLECTOMY    . VARICOSE VEIN SURGERY      reports that he has never smoked. He has never used smokeless tobacco. He reports that he drinks about 1.8 oz of alcohol per week. He reports that he does not use drugs. family history includes Coronary artery disease in his unknown relative; Heart disease in his father. Allergies  Allergen Reactions  . Statins Other (See Comments)    Muscle pain and upset stomach     Review of Systems  Constitutional: Negative for chills and fever.  Respiratory: Negative for shortness of breath.   Gastrointestinal: Negative for nausea and vomiting.  Neurological: Negative for dizziness.  Psychiatric/Behavioral: Negative for confusion.       Objective:   Physical Exam  Constitutional: He appears well-developed and well-nourished.  Skin:  Patient has erythema involving  the right great toe. No visible skin ulcers or open areas. He has some faint erythema involving the distal dorsal part of the right foot. Minimally tender to palpation. Minimal warmth. Good capillary refill       Assessment:     Cellulitis involving right great toe which appears to be extending to dorsum of the right foot    Plan:     -Start Keflex 500 milligrams 4 times a day -Elevate legs frequently -Reassess in 2 days and sooner as needed for any fever or worsening spread  [name] MD [facility]

## 2017-09-09 DIAGNOSIS — H5111 Convergence insufficiency: Secondary | ICD-10-CM | POA: Diagnosis not present

## 2017-09-09 DIAGNOSIS — H25042 Posterior subcapsular polar age-related cataract, left eye: Secondary | ICD-10-CM | POA: Diagnosis not present

## 2017-09-09 DIAGNOSIS — Z961 Presence of intraocular lens: Secondary | ICD-10-CM | POA: Diagnosis not present

## 2017-09-09 DIAGNOSIS — I10 Essential (primary) hypertension: Secondary | ICD-10-CM | POA: Diagnosis not present

## 2017-09-09 DIAGNOSIS — H35371 Puckering of macula, right eye: Secondary | ICD-10-CM | POA: Diagnosis not present

## 2017-09-10 ENCOUNTER — Ambulatory Visit (INDEPENDENT_AMBULATORY_CARE_PROVIDER_SITE_OTHER): Payer: Medicare Other | Admitting: Family Medicine

## 2017-09-10 ENCOUNTER — Encounter: Payer: Self-pay | Admitting: Family Medicine

## 2017-09-10 VITALS — BP 128/48 | HR 76 | Temp 97.9°F | Wt 203.4 lb

## 2017-09-10 DIAGNOSIS — L03031 Cellulitis of right toe: Secondary | ICD-10-CM

## 2017-09-10 NOTE — Patient Instructions (Signed)
Follow up as needed for any recurrent redness or other concerns.

## 2017-09-10 NOTE — Progress Notes (Signed)
  Subjective:     Patient ID: Darin [name], male   DOB: 1931-04-12, 84 y.o.   MRN: [id number]  HPI Here for follow up cellulitis of right great toe On Keflex and greatly improved.  Denies any fever or chills. No pain with ambulation. Swelling is especially decreased at night with elevation. Denies any side effects from medication.  Past Medical History:  Diagnosis Date  . Arthritis   . GERD (gastroesophageal reflux disease)   . H/O hiatal hernia    "FOR YEARS" " NO PROBLEM"  . Hyperlipidemia   . Hypertension   . Knee pain   . Neuropathy    PERIPHERAL   . Osteoporosis   . PMR (polymyalgia rheumatica) (HCC)   . Polymyalgia ([place]) 5 YEARS   Past Surgical History:  Procedure Laterality Date  . CARPAL TUNNEL RELEASE    . CATARACT EXTRACTION     right  . COLONOSCOPY  2007  . INGUINAL HERNIA REPAIR  10/14/2011   Procedure: HERNIA REPAIR INGUINAL ADULT;  Surgeon: [name], MD;  Location: [facility];  Service: General;  Laterality: Right;  Right Inguinal Hernia Repair with Mesh  . TONSILLECTOMY    . VARICOSE VEIN SURGERY      reports that he has never smoked. He has never used smokeless tobacco. He reports that he drinks about 1.8 oz of alcohol per week. He reports that he does not use drugs. family history includes Coronary artery disease in his unknown relative; Heart disease in his father. Allergies  Allergen Reactions  . Statins Other (See Comments)    Muscle pain and upset stomach      Review of Systems  Constitutional: Negative for chills and fever.  Gastrointestinal: Negative for nausea and vomiting.       Objective:   Physical Exam  Constitutional: He appears well-developed and well-nourished.  Cardiovascular: Normal rate and regular rhythm.  Skin:  Patient has some mild erythema right great toe but overall greatly improved compared to last visit. Previously had erythema dorsum of foot and that has resolved. Also, toe is not warm to touch. No open lesions or  wounds.       Assessment:     Cellulitis right great toe greatly improved compared to 2 days ago    Plan:     -Finish out antibiotics. Follow-up for any recurrent redness or other concerns  [name] MD [facility]

## 2017-09-16 DIAGNOSIS — L720 Epidermal cyst: Secondary | ICD-10-CM | POA: Diagnosis not present

## 2017-09-16 DIAGNOSIS — Z85828 Personal history of other malignant neoplasm of skin: Secondary | ICD-10-CM | POA: Diagnosis not present

## 2017-09-18 ENCOUNTER — Other Ambulatory Visit: Payer: Self-pay | Admitting: Family Medicine

## 2017-09-22 ENCOUNTER — Other Ambulatory Visit: Payer: Self-pay | Admitting: Family Medicine

## 2017-09-22 ENCOUNTER — Ambulatory Visit: Payer: Medicare Other

## 2017-10-03 ENCOUNTER — Ambulatory Visit: Payer: Medicare Other | Admitting: Podiatry

## 2017-10-10 ENCOUNTER — Encounter: Payer: Self-pay | Admitting: Podiatry

## 2017-10-10 ENCOUNTER — Ambulatory Visit (INDEPENDENT_AMBULATORY_CARE_PROVIDER_SITE_OTHER): Payer: Medicare Other | Admitting: Podiatry

## 2017-10-10 DIAGNOSIS — M79675 Pain in left toe(s): Secondary | ICD-10-CM | POA: Diagnosis not present

## 2017-10-10 DIAGNOSIS — B351 Tinea unguium: Secondary | ICD-10-CM | POA: Diagnosis not present

## 2017-10-10 DIAGNOSIS — M79674 Pain in right toe(s): Secondary | ICD-10-CM

## 2017-10-10 DIAGNOSIS — G609 Hereditary and idiopathic neuropathy, unspecified: Secondary | ICD-10-CM

## 2017-10-10 NOTE — Progress Notes (Signed)
Complaint:  Visit Type: Patient returns to my office for continued preventative foot care services. Complaint: Patient states" my nails have grown long and thick and become painful to walk and wear shoes" Patient has been diagnosed with neuropathy.. The patient presents for preventative foot care services. No changes to ROS  Podiatric Exam: Vascular: dorsalis pedis and posterior tibial pulses are palpable bilateral. Capillary return is immediate. Temperature gradient is WNL. Skin turgor WNL  Sensorium: Normal Semmes Weinstein monofilament test. Normal tactile sensation bilaterally. Nail Exam: Pt has thick disfigured discolored nails with subungual debris noted bilateral entire nail hallux through fifth toenails Ulcer Exam: There is no evidence of ulcer or pre-ulcerative changes or infection. Orthopedic Exam: Muscle tone and strength are WNL. No limitations in general ROM. No crepitus or effusions noted. Foot type and digits show no abnormalities. Bony prominences are unremarkable. Skin: No Porokeratosis. No infection or ulcers  Diagnosis:  Onychomycosis, , Pain in right toe, pain in left toes  Treatment & Plan Procedures and Treatment: Consent by patient was obtained for treatment procedures. The patient understood the discussion of treatment and procedures well. All questions were answered thoroughly reviewed. Debridement of mycotic and hypertrophic toenails, 1 through 5 bilateral and clearing of subungual debris. No ulceration, no infection noted.  Return Visit-Office Procedure: Patient instructed to return to the office for a follow up visit 3 months for continued evaluation and treatment.    Gardiner Barefoot DPM

## 2017-10-17 ENCOUNTER — Ambulatory Visit: Payer: Medicare Other

## 2017-10-17 NOTE — Progress Notes (Addendum)
Subjective:   Darin [name] is a 84 y.o. male who presents for Medicare Annual/Subsequent preventive examination.  Reports health as OV 08/2017   Lives alone, 8 rooms is one floor  Uses a walker to ambulate   Has walk in shower  Does his own laundry; grocery shopping Cooks as well Have house keeper and help with yard   Has a "bad right knee"  Has injection that he is due for again. Sprain ankle on the left  PMR which makes ambulation challenged   Cataract on right eye; left eye does not need intervention Engineer by trade   Diet 3 meals a day  Goes out a couple of times a week Breakfast cereal with fruit and toast with butter or jam Cutting back some Lunch; sandwich or cottage cheese or pineapple Supper cooks meats and vegetables Weight is stable   BMI  26    Exercise ADL's Was told to do a series of exercises by PT Did join a gym in the past   LTC took a fall last year In a nursing home for rehab; ( had gi bleed)  Went to [facility]   There are no preventive care reminders to display for this patient.  Eye Exam-Last exam 03/2016, yearly Dr. Martina Sinner Dental-Last exam 09/2016, every 6 months. Dr. Ashok Pall  CCS-Colonoscopy 11/24/2006, normal. [facility]   - not going to have another one.  PSA- 11/2009    Cardiac Risk Factors include: advanced age (>71men, >51 women);family history of premature cardiovascular disease;hypertension;male gender  Qualifies for Shingles Vaccine; discussed with Dr. Elease Hashimoto and will decline  Volunteers at [employer] an afternoon a week      Objective:    Vitals: BP (!) 106/50   Pulse 71   Ht 6\' 1"  (1.854 m)   Wt 201 lb (91.2 kg)   SpO2 97%   BMI 26.52 kg/m   Body mass index is 26.52 kg/m.  Advanced Directives 10/20/2017 03/12/2017 03/12/2017 10/15/2016 06/11/2016 01/16/2016 03/28/2015  Does Patient Have a Medical Advance Directive? Yes Yes Yes Yes Yes Yes Yes  Type of Advance Directive - - [place];Living will;Out of facility DNR (pink MOST or yellow form) [place];Living will [place];Living will Living will;Healthcare Power of [place];Living will  Does patient want to make changes to medical advance directive? - No - Patient declined - - - No - Patient declined -  Copy of [place] in Chart? - - No - copy requested No - copy requested Yes Yes No - copy requested  Pre-existing out of facility DNR order (yellow form or pink MOST form) - - - - - - -    Tobacco Social History   Tobacco Use  Smoking Status Never Smoker  Smokeless Tobacco Never Used     Counseling given: Yes   Clinical Intake:     Past Medical History:  Diagnosis Date  . Arthritis   . GERD (gastroesophageal reflux disease)   . H/O hiatal hernia    "FOR YEARS" " NO PROBLEM"  . Hyperlipidemia   . Hypertension   . Knee pain   . Neuropathy    PERIPHERAL   . Osteoporosis   . PMR (polymyalgia rheumatica) (HCC)   . Polymyalgia ([place]) 5 YEARS   Past Surgical History:  Procedure Laterality Date  . CARPAL TUNNEL RELEASE    . CATARACT EXTRACTION     right  . COLONOSCOPY  2007  .  INGUINAL HERNIA REPAIR  10/14/2011   Procedure: HERNIA REPAIR INGUINAL ADULT;  Surgeon: [name], MD;  Location: [facility];  Service: General;  Laterality: Right;  Right Inguinal Hernia Repair with Mesh  . TONSILLECTOMY    . VARICOSE VEIN SURGERY     Family History  Problem Relation Age of Onset  . Heart disease Father   . Coronary artery disease Unknown    Social History   Socioeconomic History  . Marital status: Widowed    Spouse name: Not on file  . Number of children: Not on file  . Years of education: Not on file  . Highest education level: Not on file  Occupational History  . Occupation: retired    Fish farm manager: RETIRED  Social Needs  . Financial resource strain: Not on file  . Food insecurity:    Worry: Not on file      Inability: Not on file  . Transportation needs:    Medical: Not on file    Non-medical: Not on file  Tobacco Use  . Smoking status: Never Smoker  . Smokeless tobacco: Never Used  Substance and Sexual Activity  . Alcohol use: Yes    Alcohol/week: 1.8 oz    Types: 3 Glasses of wine per week    Comment: half glass wine before dinner  . Drug use: No  . Sexual activity: Yes  Lifestyle  . Physical activity:    Days per week: Not on file    Minutes per session: Not on file  . Stress: Not on file  Relationships  . Social connections:    Talks on phone: Not on file    Gets together: Not on file    Attends religious service: Not on file    Active member of club or organization: Not on file    Attends meetings of clubs or organizations: Not on file    Relationship status: Not on file  Other Topics Concern  . Not on file  Social History Narrative  . Not on file    Outpatient Encounter Medications as of 10/20/2017  Medication Sig  . amLODipine (NORVASC) 5 MG tablet TAKE 1 TABLET DAILY  . aspirin 81 MG tablet Take 81 mg by mouth daily.   . benazepril-hydrochlorthiazide (LOTENSIN HCT) 10-12.5 MG tablet TAKE 1 TABLET DAILY        *PATIENT NEEDS AN          APPOINTMENT*  . famotidine (PEPCID) 20 MG tablet TAKE 1 TABLET AT BEDTIME  . finasteride (PROSCAR) 5 MG tablet TAKE 1 TABLET EACH DAY.  Marland Kitchen gabapentin (NEURONTIN) 100 MG capsule TAKE 1 CAPSULE 4 TIMES     DAILY  . niacin 500 MG tablet Take 500 mg by mouth 2 (two) times daily with a meal.   . polyethylene glycol (MIRALAX / GLYCOLAX) packet Take 17 g by mouth daily as needed for moderate constipation.  . vitamin C (ASCORBIC ACID) 500 MG tablet Take 500 mg by mouth daily.  Marland Kitchen augmented betamethasone dipropionate (DIPROLENE-AF) 0.05 % cream   . celecoxib (CELEBREX) 200 MG capsule Take 1 capsule (200 mg total) by mouth daily. (Patient not taking: Reported on 10/20/2017)  . diclofenac sodium (VOLTAREN) 1 % GEL Apply 2 g topically 4 (four)  times daily. (Patient not taking: Reported on 10/20/2017)  . [DISCONTINUED] cephALEXin (KEFLEX) 500 MG capsule Take 1 capsule (500 mg total) by mouth 4 (four) times daily. (Patient not taking: Reported on 10/20/2017)   No facility-administered encounter medications on file as  of 10/20/2017.     Activities of Daily Living In your present state of health, do you have any difficulty performing the following activities: 10/20/2017 03/12/2017  Hearing? N -  Vision? N -  Difficulty concentrating or making decisions? N -  Comment remains independent at home  -  Walking or climbing stairs? Y -  Comment with a handrail -  Dressing or bathing? N -  Doing errands, shopping? N N  Preparing Food and eating ? N -  Using the Toilet? N -  In the past six months, have you accidently leaked urine? N -  Do you have problems with loss of bowel control? N -  Managing your Medications? N -  Managing your Finances? N -  Housekeeping or managing your Housekeeping? N -  Some recent data might be hidden    Patient Care Team: [name], MD as PCP - General (Family Medicine) Gean Birchwood, DPM as Consulting Physician (Podiatry)   Assessment:   This is a routine wellness examination for [place].  Exercise Activities and Dietary recommendations Current Exercise Habits: Home exercise routine, Type of exercise: walking(daily living and occasionaly PT exercise )  Goals    . Exercise 150 min/wk Moderate Activity (pt-stated)     Try to incorporate the PT exercises during the day     . Patient Stated     Take care of your self;  Try some of your PT exercises !         Fall Risk Fall Risk  10/20/2017 10/15/2016 07/19/2016 06/15/2015 12/30/2014  Falls in the past year? Yes No Yes No No  Comment due to GI Bleed  - - - -  Number falls in past yr: 1 - 1 - -  Injury with Fall? Yes - No - -  Risk Factor Category  High Fall Risk - - - -  Risk for fall due to : Impaired balance/gait - - - -  Follow up  Education provided - - - -     Depression Screen PHQ 2/9 Scores 10/20/2017 10/15/2016 07/19/2016 06/15/2015  PHQ - 2 Score 0 0 0 0    Cognitive Function MMSE - Mini Mental State Exam 10/20/2017  Not completed: (No Data)   Ad8 score reviewed for issues:  Issues making decisions:  Less interest in hobbies / activities:  Repeats questions, stories (family complaining):  Trouble using ordinary gadgets (microwave, computer, phone):  Forgets the month or year:   Mismanaging finances:   Remembering appts:  Daily problems with thinking and/or memory: Ad8 score is=0          Immunization History  Administered Date(s) Administered  . Hep A / Hep B 06/15/2012, 07/17/2012, 12/18/2012  . Influenza Whole 03/26/2003, 01/12/2007  . Influenza, High Dose Seasonal PF 01/27/2015, 12/21/2015  . Influenza,inj,Quad PF,6+ Mos 03/08/2014  . Pneumococcal Conjugate-13 11/16/2013  . Pneumococcal Polysaccharide-23 12/12/2009  . Td 03/26/2003  . Tdap 06/15/2015    Screening Tests Health Maintenance  Topic Date Due  . INFLUENZA VACCINE  10/23/2017  . TETANUS/TDAP  06/14/2025  . PNA vac Low Risk Adult  Completed       Plan:      PCP Notes   Health Maintenance Eye Exam-Last exam 03/2016, yearly Dr. Martina Sinner Dental-Last exam 09/2016, every 6 months. Dr. Ashok Pall  CCS-Colonoscopy 11/24/2006, normal.  [facility]   - not going to have another one.  PSA- 11/2009  Declines shingrix per Dr. Elease Hashimoto   Abnormal Screens  none  Referrals  none  Patient concerns; To see MD today for toe issues Also recent sprain to left foot and may take another look  This was early June injury and was xrayed   Nurse Concerns; As noted Doing well and support services as needed in the home.Still driving; mentally managing without failure.   Next PCP apt Will be seen today    I have personally reviewed and noted the following in the patient's chart:   . Medical and social  history . Use of alcohol, tobacco or illicit drugs  . Current medications and supplements . Functional ability and status . Nutritional status . Physical activity . Advanced directives . List of other physicians . Hospitalizations, surgeries, and ER visits in previous 12 months . Vitals . Screenings to include cognitive, depression, and falls . Referrals and appointments  In addition, I have reviewed and discussed with patient certain preventive protocols, quality metrics, and best practice recommendations. A written personalized care plan for preventive services as well as general preventive health recommendations were provided to patient.     AXKPV,VZSMO, RN  10/20/2017  I have reviewed the documentation for the AWV and [place] provided by the health coach and agree with their documentation. I was immediately available for any questions  [name] MD [facility]

## 2017-10-20 ENCOUNTER — Ambulatory Visit (INDEPENDENT_AMBULATORY_CARE_PROVIDER_SITE_OTHER): Payer: Medicare Other | Admitting: Family Medicine

## 2017-10-20 ENCOUNTER — Ambulatory Visit (INDEPENDENT_AMBULATORY_CARE_PROVIDER_SITE_OTHER): Payer: Medicare Other

## 2017-10-20 VITALS — BP 106/50 | HR 71 | Ht 73.0 in | Wt 201.0 lb

## 2017-10-20 VITALS — BP 106/50 | HR 71 | Temp 97.8°F | Ht 73.0 in | Wt 201.0 lb

## 2017-10-20 DIAGNOSIS — Z0000 Encounter for general adult medical examination without abnormal findings: Secondary | ICD-10-CM | POA: Diagnosis not present

## 2017-10-20 DIAGNOSIS — L03818 Cellulitis of other sites: Secondary | ICD-10-CM

## 2017-10-20 MED ORDER — CEPHALEXIN 500 MG PO CAPS: 500.0000 mg | ORAL_CAPSULE | Freq: Three times a day (TID) | ORAL | 0 refills | Status: AC

## 2017-10-20 NOTE — Patient Instructions (Addendum)
Please let us know if any worsening of symptoms. We will check in with you in 2 days time to see how redness and swelling are doing.   Keep feel elevated above heart level when sitting/lying down; restart compression stockings.

## 2017-10-20 NOTE — Progress Notes (Signed)
  Darin [name] DOB: Nov 21, 1931 Encounter date: 10/20/2017  This is a 84 y.o. male who presents with Chief Complaint  Patient presents with  . toe redness    middle toe right foot, noticed one week ago, applying neosporin twice a day, patient states that his feet/toes stay swollen, was painful at first but the pain as gone away,     History of present illness:  Was heavily crusted at base of toe about a week ago; neosporin seemed to help. Not sure what triggered this. Redness is marginally better. Base was painful but pain radiated some. Pain has completely resolved. Not painful at rest or with walking.     Allergies  Allergen Reactions  . Statins Other (See Comments)    Muscle pain and upset stomach   No outpatient medications have been marked as taking for the 10/20/17 encounter (Office Visit) with Caren Macadam, MD.    Review of Systems  Constitutional: Negative for chills and fever.  Cardiovascular: Positive for leg swelling (stopped wearing compression stockings about 2 weeks ago; has noted some stable swelling since that time. Stopped because he was on vacation. ).  Skin: Positive for color change. Negative for pallor and rash.    Objective:  BP (!) 106/50   Pulse 71   Temp 97.8 F (36.6 C) (Oral)   Ht 6\' 1"  (1.854 m)   Wt 201 lb (91.2 kg)   SpO2 97%   BMI 26.52 kg/m   Weight: 201 lb (91.2 kg)   BP Readings from Last 3 Encounters:  10/20/17 (!) 106/50  10/20/17 (!) 106/50  09/10/17 (!) 128/48   Wt Readings from Last 3 Encounters:  10/20/17 201 lb (91.2 kg)  10/20/17 201 lb (91.2 kg)  09/10/17 203 lb 6.4 oz (92.3 kg)    Physical Exam  Constitutional: He appears well-developed and well-nourished. No distress.  Cardiovascular:  Pulses:      Dorsalis pedis pulses are 2+ on the right side.  1+ bilat pitting edema lower leg/feet.   Right foot is warm and well perfused with symmetric cap refill toes. There is mild tenderness with palpation of base of  third toe. There is some yellow crusting over skin on this toe. Toe is mildly erythematous. Edema is evenly distributed through ankle, foot, toe.  Pulmonary/Chest: Effort normal. No respiratory distress.    Assessment/Plan  1. Cellulitis of other specified site Has responded well to keflex in the past; short course to help clear up any residual infection. Stressed importance of lower extremity elevation and restarting compression stocking use. He states that he has been able to improve similar edema with compression stockings in the past. Call with any worsening of toe, otherwise we will check in on weds to ensure improvement. If edema not improving could consider stronger diuretic; but would need to monitor bp as he is on lower end of normal.  - cephALEXin (KEFLEX) 500 MG capsule; Take 1 capsule (500 mg total) by mouth 3 (three) times daily for 5 days.  Dispense: 15 capsule; Refill: 0        Micheline Rough, MD

## 2017-10-20 NOTE — Patient Instructions (Addendum)
Darin [name] , Thank you for taking time to come for your Medicare Wellness Visit. I appreciate your ongoing commitment to your health goals. Please review the following plan we discussed and let me know if I can assist you in the future.   Please make an apt with Manuela Schwartz for your AWV next year   Declines shingrix per Dr. Elease Hashimoto   Will be seeing doctor today    These are the goals we discussed: Goals    . Patient Stated     Take care of your self;  Try some of your PT exercises !         This is a list of the screening recommended for you and due dates:  Health Maintenance  Topic Date Due  . Flu Shot  10/23/2017  . Tetanus Vaccine  06/14/2025  . Pneumonia vaccines  Completed    Prevention of falls: Remove rugs or any tripping hazards in the home Use Non slip mats in bathtubs and showers Placing grab bars next to the toilet and or shower Placing handrails on both sides of the stair way Adding extra lighting in the home.   Personal safety issues reviewed:  1. Consider starting a community watch program per [facility] 2.  Changes batteries is smoke detector and/or carbon monoxide detector  3.  If you have firearms; keep them in a safe place 4.  Wear protection when in the sun; Always wear sunscreen or a hat; It is good to have your doctor check your skin annually or review any new areas of concern 5. Driving safety; Keep in the right lane; stay 3 car lengths behind the car in front of you on the highway; look 3 times prior to pulling out; carry your cell phone everywhere you go!         Fall Prevention in the Home Falls can cause injuries. They can happen to people of all ages. There are many things you can do to make your home safe and to help prevent falls. What can I do on the outside of my home?  Regularly fix the edges of walkways and driveways and fix any cracks.  Remove anything that might make you trip as you walk through a door, such as a  raised step or threshold.  Trim any bushes or trees on the path to your home.  Use bright outdoor lighting.  Clear any walking paths of anything that might make someone trip, such as rocks or tools.  Regularly check to see if handrails are loose or broken. Make sure that both sides of any steps have handrails.  Any raised decks and porches should have guardrails on the edges.  Have any leaves, snow, or ice cleared regularly.  Use sand or salt on walking paths during winter.  Clean up any spills in your garage right away. This includes oil or grease spills. What can I do in the bathroom?  Use night lights.  Install grab bars by the toilet and in the tub and shower. Do not use towel bars as grab bars.  Use non-skid mats or decals in the tub or shower.  If you need to sit down in the shower, use a plastic, non-slip stool.  Keep the floor dry. Clean up any water that spills on the floor as soon as it happens.  Remove soap buildup in the tub or shower regularly.  Attach bath mats securely with double-sided non-slip rug tape.  Do not have throw rugs and  other things on the floor that can make you trip. What can I do in the bedroom?  Use night lights.  Make sure that you have a light by your bed that is easy to reach.  Do not use any sheets or blankets that are too big for your bed. They should not hang down onto the floor.  Have a firm chair that has side arms. You can use this for support while you get dressed.  Do not have throw rugs and other things on the floor that can make you trip. What can I do in the kitchen?  Clean up any spills right away.  Avoid walking on wet floors.  Keep items that you use a lot in easy-to-reach places.  If you need to reach something above you, use a strong step stool that has a grab bar.  Keep electrical cords out of the way.  Do not use floor polish or wax that makes floors slippery. If you must use wax, use non-skid floor  wax.  Do not have throw rugs and other things on the floor that can make you trip. What can I do with my stairs?  Do not leave any items on the stairs.  Make sure that there are handrails on both sides of the stairs and use them. Fix handrails that are broken or loose. Make sure that handrails are as long as the stairways.  Check any carpeting to make sure that it is firmly attached to the stairs. Fix any carpet that is loose or worn.  Avoid having throw rugs at the top or bottom of the stairs. If you do have throw rugs, attach them to the floor with carpet tape.  Make sure that you have a light switch at the top of the stairs and the bottom of the stairs. If you do not have them, ask someone to add them for you. What else can I do to help prevent falls?  Wear shoes that: ? Do not have high heels. ? Have rubber bottoms. ? Are comfortable and fit you well. ? Are closed at the toe. Do not wear sandals.  If you use a stepladder: ? Make sure that it is fully opened. Do not climb a closed stepladder. ? Make sure that both sides of the stepladder are locked into place. ? Ask someone to hold it for you, if possible.  Clearly mark and make sure that you can see: ? Any grab bars or handrails. ? First and last steps. ? Where the edge of each step is.  Use tools that help you move around (mobility aids) if they are needed. These include: ? Canes. ? Walkers. ? Scooters. ? Crutches.  Turn on the lights when you go into a dark area. Replace any light bulbs as soon as they burn out.  Set up your furniture so you have a clear path. Avoid moving your furniture around.  If any of your floors are uneven, fix them.  If there are any pets around you, be aware of where they are.  Review your medicines with your doctor. Some medicines can make you feel dizzy. This can increase your chance of falling. Ask your doctor what other things that you can do to help prevent falls. This information is  not intended to replace advice given to you by your health care provider. Make sure you discuss any questions you have with your health care provider. Document Released: 01/05/2009 Document Revised: 08/17/2015 Document Reviewed: 04/15/2014 Elsevier Interactive  Patient Education  2018 [place] Maintenance, Male A healthy lifestyle and preventive care is important for your health and wellness. Ask your health care provider about what schedule of regular examinations is right for you. What should I know about weight and diet? Eat a Healthy Diet  Eat plenty of vegetables, fruits, whole grains, low-fat dairy products, and lean protein.  Do not eat a lot of foods high in solid fats, added sugars, or salt.  Maintain a Healthy Weight Regular exercise can help you achieve or maintain a healthy weight. You should:  Do at least 150 minutes of exercise each week. The exercise should increase your heart rate and make you sweat (moderate-intensity exercise).  Do strength-training exercises at least twice a week.  Watch Your Levels of Cholesterol and Blood Lipids  Have your blood tested for lipids and cholesterol every 5 years starting at 82 years of age. If you are at high risk for heart disease, you should start having your blood tested when you are 82 years old. You may need to have your cholesterol levels checked more often if: ? Your lipid or cholesterol levels are high. ? You are older than 82 years of age. ? You are at high risk for heart disease.  What should I know about cancer screening? Many types of cancers can be detected early and may often be prevented. Lung Cancer  You should be screened every year for lung cancer if: ? You are a current smoker who has smoked for at least 30 years. ? You are a former smoker who has quit within the past 15 years.  Talk to your health care provider about your screening options, when you should start screening, and how often you  should be screened.  Colorectal Cancer  Routine colorectal cancer screening usually begins at 82 years of age and should be repeated every 5-10 years until you are 82 years old. You may need to be screened more often if early forms of precancerous polyps or small growths are found. Your health care provider may recommend screening at an earlier age if you have risk factors for colon cancer.  Your health care provider may recommend using home test kits to check for hidden blood in the stool.  A small camera at the end of a tube can be used to examine your colon (sigmoidoscopy or colonoscopy). This checks for the earliest forms of colorectal cancer.  Prostate and Testicular Cancer  Depending on your age and overall health, your health care provider may do certain tests to screen for prostate and testicular cancer.  Talk to your health care provider about any symptoms or concerns you have about testicular or prostate cancer.  Skin Cancer  Check your skin from head to toe regularly.  Tell your health care provider about any new moles or changes in moles, especially if: ? There is a change in a mole's size, shape, or color. ? You have a mole that is larger than a pencil eraser.  Always use sunscreen. Apply sunscreen liberally and repeat throughout the day.  Protect yourself by wearing long sleeves, pants, a wide-brimmed hat, and sunglasses when outside.  What should I know about heart disease, diabetes, and high blood pressure?  If you are 7-34 years of age, have your blood pressure checked every 3-5 years. If you are 26 years of age or older, have your blood pressure checked every year. You should have your blood pressure measured twice-once when you are  at a hospital or clinic, and once when you are not at a hospital or clinic. Record the average of the two measurements. To check your blood pressure when you are not at a hospital or clinic, you can use: ? An automated blood pressure  machine at a pharmacy. ? A home blood pressure monitor.  Talk to your health care provider about your target blood pressure.  If you are between 67-33 years old, ask your health care provider if you should take aspirin to prevent heart disease.  Have regular diabetes screenings by checking your fasting blood sugar level. ? If you are at a normal weight and have a low risk for diabetes, have this test once every three years after the age of 10. ? If you are overweight and have a high risk for diabetes, consider being tested at a younger age or more often.  A one-time screening for abdominal aortic aneurysm (AAA) by ultrasound is recommended for men aged 92-75 years who are current or former smokers. What should I know about preventing infection? Hepatitis B If you have a higher risk for hepatitis B, you should be screened for this virus. Talk with your health care provider to find out if you are at risk for hepatitis B infection. Hepatitis C Blood testing is recommended for:  Everyone born from [birth year] through 1963.  Anyone with known risk factors for hepatitis C.  Sexually Transmitted Diseases (STDs)  You should be screened each year for STDs including gonorrhea and chlamydia if: ? You are sexually active and are younger than 82 years of age. ? You are older than 82 years of age and your health care provider tells you that you are at risk for this type of infection. ? Your sexual activity has changed since you were last screened and you are at an increased risk for chlamydia or gonorrhea. Ask your health care provider if you are at risk.  Talk with your health care provider about whether you are at high risk of being infected with HIV. Your health care provider may recommend a prescription medicine to help prevent HIV infection.  What else can I do?  Schedule regular health, dental, and eye exams.  Stay current with your vaccines (immunizations).  Do not use any tobacco products,  such as cigarettes, chewing tobacco, and e-cigarettes. If you need help quitting, ask your health care provider.  Limit alcohol intake to no more than 2 drinks per day. One drink equals 12 ounces of beer, 5 ounces of wine, or 1 ounces of hard liquor.  Do not use street drugs.  Do not share needles.  Ask your health care provider for help if you need support or information about quitting drugs.  Tell your health care provider if you often feel depressed.  Tell your health care provider if you have ever been abused or do not feel safe at home. This information is not intended to replace advice given to you by your health care provider. Make sure you discuss any questions you have with your health care provider. Document Released: 09/07/2007 Document Revised: 11/08/2015 Document Reviewed: 12/13/2014 Elsevier Interactive Patient Education  [employer].

## 2017-10-22 ENCOUNTER — Ambulatory Visit: Payer: Medicare Other | Admitting: Family Medicine

## 2017-10-23 ENCOUNTER — Encounter: Payer: Self-pay | Admitting: Family Medicine

## 2017-10-23 ENCOUNTER — Ambulatory Visit (INDEPENDENT_AMBULATORY_CARE_PROVIDER_SITE_OTHER): Payer: Medicare Other | Admitting: Family Medicine

## 2017-10-23 DIAGNOSIS — M1711 Unilateral primary osteoarthritis, right knee: Secondary | ICD-10-CM

## 2017-10-23 NOTE — Patient Instructions (Signed)
Good to see you  Darin [name] is your friend.  Injected the knee  I am sorry for the abbreviated trip  See me again in 10 weeks

## 2017-10-23 NOTE — Progress Notes (Signed)
[name] [address], [place] [address] Phone: [phone] Subjective:    CC: Right knee pain  FHL:[id number]  Darin [name] is a 84 y.o. male coming in with complaint of right knee pain. Patient complains of popping. His pain began to increase 2-3 weeks ago. Pain is constant, 7/10. Pain is radiating down into the right ankle. Did sprain the left ankle on his trip to [place] in May. Pain is on lateral aspect of left ankle and in the left calf.      Past Medical History:  Diagnosis Date  . Arthritis   . GERD (gastroesophageal reflux disease)   . H/O hiatal hernia    "FOR YEARS" " NO PROBLEM"  . Hyperlipidemia   . Hypertension   . Knee pain   . Neuropathy    PERIPHERAL   . Osteoporosis   . PMR (polymyalgia rheumatica) (HCC)   . Polymyalgia ([facility]) 5 YEARS   Past Surgical History:  Procedure Laterality Date  . CARPAL TUNNEL RELEASE    . CATARACT EXTRACTION     right  . COLONOSCOPY  2007  . INGUINAL HERNIA REPAIR  10/14/2011   Procedure: HERNIA REPAIR INGUINAL ADULT;  Surgeon: [name], MD;  Location: [facility];  Service: General;  Laterality: Right;  Right Inguinal Hernia Repair with Mesh  . TONSILLECTOMY    . VARICOSE VEIN SURGERY     Social History   Socioeconomic History  . Marital status: Widowed    Spouse name: Not on file  . Number of children: Not on file  . Years of education: Not on file  . Highest education level: Not on file  Occupational History  . Occupation: retired    Fish farm manager: RETIRED  Social Needs  . Financial resource strain: Not on file  . Food insecurity:    Worry: Not on file    Inability: Not on file  . Transportation needs:    Medical: Not on file    Non-medical: Not on file  Tobacco Use  . Smoking status: Never Smoker  . Smokeless tobacco: Never Used  Substance and Sexual Activity  . Alcohol use: Yes    Alcohol/week: 1.8 oz    Types: 3 Glasses of wine per week    Comment: half glass wine  before dinner  . Drug use: No  . Sexual activity: Yes  Lifestyle  . Physical activity:    Days per week: Not on file    Minutes per session: Not on file  . Stress: Not on file  Relationships  . Social connections:    Talks on phone: Not on file    Gets together: Not on file    Attends religious service: Not on file    Active member of club or organization: Not on file    Attends meetings of clubs or organizations: Not on file    Relationship status: Not on file  Other Topics Concern  . Not on file  Social History Narrative  . Not on file   Allergies  Allergen Reactions  . Statins Other (See Comments)    Muscle pain and upset stomach   Family History  Problem Relation Age of Onset  . Heart disease Father   . Coronary artery disease Unknown      Past medical history, social, surgical and family history all reviewed in electronic medical record.  No pertanent information unless stated regarding to the chief complaint.   Review of Systems:Review of systems updated  and as accurate as of 10/23/17  No headache, visual changes, nausea, vomiting, diarrhea, constipation, dizziness, abdominal pain, skin rash, fevers, chills, night sweats, weight loss, swollen lymph nodes, body aches, joint swelling,  chest pain, shortness of breath, mood changes.  Positive muscle aches  Objective  Blood pressure 122/60, pulse 78, height 6\' 1"  (1.854 m), weight 198 lb (89.8 kg), SpO2 98 %. Systems examined below as of 10/23/17   General: No apparent distress alert and oriented x3 mood and affect normal, dressed appropriately.  HEENT: Pupils equal, extraocular movements intact  Respiratory: Patient's speak in full sentences and does not appear short of breath  Cardiovascular: 2+ lower extremity edema, non tender, no erythema  Skin: Warm dry intact with no signs of infection or rash on extremities or on axial skeleton.  Abdomen: Soft nontender  Neuro: Cranial nerves II through XII are intact,  neurovascularly intact in all extremities with 2+ DTRs and 2+ pulses.  Lymph: No lymphadenopathy of posterior or anterior cervical chain or axillae bilaterally.  Gait antalgic walking with the aid of a walker MSK:  Non tender with full range of motion and good stability and symmetric strength and tone of shoulders, elbows, wrist, hip, and ankles bilaterally.    Knee: Right valgus deformity noted. Large thigh to calf ratio.  Tender to palpation over medial and PF joint line.  ROM full in flexion and extension and lower leg rotation. instability with valgus force.  painful patellar compression. Patellar glide with moderate crepitus. Patellar and quadriceps tendons unremarkable. Hamstring and quadriceps strength is normal. Contralateral knee shows arthritic changes as well with no signs of cellulitis of the legs today       Impression and Recommendations:     This case required medical decision making of moderate complexity.      Note: This dictation was prepared with Dragon dictation along with smaller phrase technology. Any transcriptional errors that result from this process are unintentional.

## 2017-10-23 NOTE — Assessment & Plan Note (Signed)
Patient given injection.  Tolerated procedure well.  We discussed icing regimen and home exercises.  Has failed Vis[place] supplementation.  Discussed which activities to do which wants to avoid them.  Patient will continue to work with the aid of a walker.  Has custom brace but does not like wearing it.  Only curative would be replacement which patient is declined.  Can repeat steroid injections every 10 weeks safely

## 2017-11-03 ENCOUNTER — Other Ambulatory Visit: Payer: Self-pay | Admitting: Family Medicine

## 2017-11-03 ENCOUNTER — Telehealth: Payer: Self-pay | Admitting: Family Medicine

## 2017-11-03 NOTE — Telephone Encounter (Signed)
Copied from [place] [id number]. Topic: General - Other >> Nov 03, 2017  3:42 PM Bea Graff, NT wrote: Reason for CRM: Pt states that Ria Comment in Dr. Darliss Ridgel office tried calling him a few days ago and he was returning her call. Please advise.

## 2017-11-25 ENCOUNTER — Ambulatory Visit (INDEPENDENT_AMBULATORY_CARE_PROVIDER_SITE_OTHER): Payer: Medicare Other | Admitting: Podiatry

## 2017-11-25 ENCOUNTER — Encounter: Payer: Self-pay | Admitting: Podiatry

## 2017-11-25 DIAGNOSIS — M722 Plantar fascial fibromatosis: Secondary | ICD-10-CM

## 2017-11-25 NOTE — Progress Notes (Signed)
Complaint:  Visit Type: Patient returns to my office for an evaluation of the bottom of his right foot.  He says that he has developed painful bumps which he noticed the last few days.  He says that he feels pain radiating from the bottom of his foot into his big toe, right foot.  He has no history of trauma or injury to the foot.  He presents the office today for an evaluation and treatment of these hard masses in the bottom of his right foot.  Podiatric Exam: Vascular: dorsalis pedis and posterior tibial pulses are palpable bilateral. Capillary return is immediate. Temperature gradient is WNL. Skin turgor WNL  Sensorium: Normal Semmes Weinstein monofilament test. Normal tactile sensation bilaterally. Nail Exam: Pt has thick disfigured discolored nails with subungual debris noted bilateral entire nail hallux through fifth toenails Ulcer Exam: There is no evidence of ulcer or pre-ulcerative changes or infection. Orthopedic Exam: Muscle tone and strength are WNL. No limitations in general ROM. No crepitus or effusions noted. Foot type and digits show no abnormalities.  There are 2 fibromas noted in the center of his right foot along the medial band plantar fascia.  These lesions are enlarged cysts which are non movable.   Skin: No Porokeratosis. No infection or ulcers  Diagnosis:  Plantar Fibromatosis right foot.  Treatment & Plan Procedures and Treatment: ROV.  Discussed this condition  with this patient. I injected a mixture of 1 mL of a mixture of 2% Xylocaine plus Kenalog-40,  1 mL total.  Each  fibroma received a half of a millimeter injected into the center of the lesions.  RTC 4 weeks. Return Visit-Office Procedure: Patient instructed to return to the office for a follow up visit 3 months for continued evaluation and treatment.    Gardiner Barefoot DPM

## 2017-12-11 ENCOUNTER — Other Ambulatory Visit: Payer: Self-pay | Admitting: Family Medicine

## 2017-12-15 ENCOUNTER — Telehealth: Payer: Self-pay | Admitting: Family Medicine

## 2017-12-15 NOTE — Telephone Encounter (Signed)
Copied from [place] [id number]. Topic: Quick Communication - See Telephone Encounter >> Dec 15, 2017  8:58 AM Alfredia [name] R wrote: Pt called in to ask if he can get his gel shots on 01/01/2018 by Dr Tamala [name] when he comes in or does he have to wait and what is the time length  Cb# [id number]

## 2017-12-22 ENCOUNTER — Telehealth: Payer: Self-pay | Admitting: *Deleted

## 2017-12-22 NOTE — Telephone Encounter (Signed)
Copied from [place] [id number]. Topic: General - Other >> Dec 19, 2017  1:30 PM Yvette Rack wrote: Reason for CRM: pt has an appt with Dr Tamala [name] on 01-01-18 it was for a 6 weeks f/u but he want his rt knee to get gel injected into it on this day we made the changes on is appt please call pt to let him know if this will be ok to do the injection

## 2017-12-26 ENCOUNTER — Ambulatory Visit (INDEPENDENT_AMBULATORY_CARE_PROVIDER_SITE_OTHER): Payer: Medicare Other | Admitting: Podiatry

## 2017-12-26 ENCOUNTER — Encounter: Payer: Self-pay | Admitting: Podiatry

## 2017-12-26 DIAGNOSIS — B351 Tinea unguium: Secondary | ICD-10-CM | POA: Diagnosis not present

## 2017-12-26 DIAGNOSIS — M79675 Pain in left toe(s): Secondary | ICD-10-CM

## 2017-12-26 DIAGNOSIS — M79674 Pain in right toe(s): Secondary | ICD-10-CM

## 2017-12-26 NOTE — Progress Notes (Signed)
Complaint:  Visit Type: Patient returns to my office for continued preventative foot care services. Complaint: Patient states" my nails have grown long and thick and become painful to walk and wear shoes" Patient has been diagnosed with neuropathy.. The patient presents for preventative foot care services. No changes to ROS  Podiatric Exam: Vascular: dorsalis pedis and posterior tibial pulses are palpable bilateral. Capillary return is immediate. Temperature gradient is WNL. Skin turgor WNL  Sensorium: Normal Semmes Weinstein monofilament test. Normal tactile sensation bilaterally. Nail Exam: Pt has thick disfigured discolored nails with subungual debris noted bilateral entire nail hallux through fifth toenails Ulcer Exam: There is no evidence of ulcer or pre-ulcerative changes or infection. Orthopedic Exam: Muscle tone and strength are WNL. No limitations in general ROM. No crepitus or effusions noted. Foot type and digits show no abnormalities. Bony prominences are unremarkable. Skin: No Porokeratosis. No infection or ulcers  Diagnosis:  Onychomycosis, , Pain in right toe, pain in left toes  Treatment & Plan Procedures and Treatment: Consent by patient was obtained for treatment procedures. The patient understood the discussion of treatment and procedures well. All questions were answered thoroughly reviewed. Debridement of mycotic and hypertrophic toenails, 1 through 5 bilateral and clearing of subungual debris. No ulceration, no infection noted.  Return Visit-Office Procedure: Patient instructed to return to the office for a follow up visit 3 months for continued evaluation and treatment.    Gardiner Barefoot DPM

## 2017-12-29 ENCOUNTER — Other Ambulatory Visit: Payer: Self-pay

## 2017-12-29 ENCOUNTER — Encounter: Payer: Self-pay | Admitting: Family Medicine

## 2017-12-29 ENCOUNTER — Ambulatory Visit (INDEPENDENT_AMBULATORY_CARE_PROVIDER_SITE_OTHER): Payer: Medicare Other | Admitting: Family Medicine

## 2017-12-29 VITALS — BP 118/60 | HR 76 | Temp 97.6°F | Ht 73.0 in | Wt 198.2 lb

## 2017-12-29 DIAGNOSIS — K219 Gastro-esophageal reflux disease without esophagitis: Secondary | ICD-10-CM

## 2017-12-29 DIAGNOSIS — N400 Enlarged prostate without lower urinary tract symptoms: Secondary | ICD-10-CM | POA: Diagnosis not present

## 2017-12-29 DIAGNOSIS — H6123 Impacted cerumen, bilateral: Secondary | ICD-10-CM | POA: Diagnosis not present

## 2017-12-29 DIAGNOSIS — I10 Essential (primary) hypertension: Secondary | ICD-10-CM | POA: Diagnosis not present

## 2017-12-29 NOTE — Progress Notes (Signed)
Subjective:     Patient ID: Darin [name], male   DOB: 10-Jun-1931, 84 y.o.   MRN: [id number]  HPI Patient seen for medical follow-up.  Chronic problems include history of hypertension, GERD, BPH, degenerative arthritis.  He still travels frequently.  He has done many international trips as well as extensive travel throughout the [place].  Denies any recent falls.  He has had some balance issues in the past and has had extensive physical therapy.  Blood pressures been stable.  No recent orthostatic symptoms.  GERD is controlled with over-the-counter Pepcid.  He denies any slow urinary stream.  He remains on finasteride for BPH.  Already had flu vaccine.  Bilateral ear congestion.  He has had problems with wax buildup in the past.  Requesting these be checked.  Past Medical History:  Diagnosis Date  . Arthritis   . GERD (gastroesophageal reflux disease)   . H/O hiatal hernia    "FOR YEARS" " NO PROBLEM"  . Hyperlipidemia   . Hypertension   . Knee pain   . Neuropathy    PERIPHERAL   . Osteoporosis   . PMR (polymyalgia rheumatica) (HCC)   . Polymyalgia ([place]) 5 YEARS   Past Surgical History:  Procedure Laterality Date  . CARPAL TUNNEL RELEASE    . CATARACT EXTRACTION     right  . COLONOSCOPY  2007  . INGUINAL HERNIA REPAIR  10/14/2011   Procedure: HERNIA REPAIR INGUINAL ADULT;  Surgeon: [name], MD;  Location: [facility];  Service: General;  Laterality: Right;  Right Inguinal Hernia Repair with Mesh  . TONSILLECTOMY    . VARICOSE VEIN SURGERY      reports that he has never smoked. He has never used smokeless tobacco. He reports that he drinks about 3.0 standard drinks of alcohol per week. He reports that he does not use drugs. family history includes Coronary artery disease in his unknown relative; Heart disease in his father. Allergies  Allergen Reactions  . Statins Other (See Comments)    Muscle pain and upset stomach     Review of Systems  Constitutional: Negative for  fatigue.  HENT: Negative for ear discharge.   Eyes: Negative for visual disturbance.  Respiratory: Negative for cough, chest tightness and shortness of breath.   Cardiovascular: Negative for chest pain, palpitations and leg swelling.  Gastrointestinal: Negative for abdominal pain.  Endocrine: Negative for polydipsia and polyuria.  Genitourinary: Negative for difficulty urinating and dysuria.  Neurological: Negative for dizziness, syncope, weakness, light-headedness and headaches.       Objective:   Physical Exam  Constitutional: He is oriented to person, place, and time. He appears well-developed and well-nourished.  HENT:  Mouth/Throat: Oropharynx is clear and moist.  He has cerumen impactions bilaterally.  Removed with curette without difficulty.  Eardrums appear normal.  Eyes: Pupils are equal, round, and reactive to light.  Neck: Neck supple. No thyromegaly present.  Cardiovascular: Normal rate and regular rhythm.  Pulmonary/Chest: Effort normal and breath sounds normal. No respiratory distress. He has no wheezes. He has no rales.  Musculoskeletal: He exhibits no edema.  He has support hose on bilaterally  Neurological: He is alert and oriented to person, place, and time.       Assessment:     #1 hypertension stable and at goal  #2 bilateral cerumen impactions removed with curette  #3 GERD stable on over-the-counter Pepcid  #4 BPH symptomatically stable on finasteride    Plan:     -Flu vaccine already  given at hospital -Continue current medications -Cerumen removal with curette as above -Recommend routine follow-up in about 5 to 6 months.  Will need to get some follow-up labs then including basic metabolic panel  [name] MD [facility]

## 2018-01-01 ENCOUNTER — Ambulatory Visit: Payer: Medicare Other | Admitting: Family Medicine

## 2018-01-15 ENCOUNTER — Encounter: Payer: Self-pay | Admitting: Family Medicine

## 2018-01-15 ENCOUNTER — Ambulatory Visit (INDEPENDENT_AMBULATORY_CARE_PROVIDER_SITE_OTHER): Payer: Medicare Other | Admitting: Family Medicine

## 2018-01-15 DIAGNOSIS — M1711 Unilateral primary osteoarthritis, right knee: Secondary | ICD-10-CM | POA: Diagnosis not present

## 2018-01-15 NOTE — Assessment & Plan Note (Signed)
Worsening symptoms.  Started Visco supplementation.  Discussed icing regimen and home exercise.  Discussed topical anti-inflammatories.  Continue to use the aid of a walker.  Follow-up with me again in 1 week for second in a series of 4 injections.  Spent  25 minutes with patient face-to-face and had greater than 50% of counseling including as described above in assessment and plan.

## 2018-01-15 NOTE — Patient Instructions (Signed)
God to see you  You are doing great overall  New exercises for the back  See you soon for the next injection

## 2018-01-15 NOTE — Progress Notes (Signed)
[name] [address] [place], [place] [address] Phone: [phone] Subjective:   [name], am serving as a scribe for Dr. Hulan Saas.   CC: Right knee pain  [id number]  Darin [name] is a 84 y.o. male coming in with complaint of right knee pain. He had cortisone injection last visit which helped alleviate his pain. Is having left lumbar and glute pain. Has been having pain in that area for 2 months. Pain and tingling can radiate into the back of the left leg. Also complains of left calf tightness.      Past Medical History:  Diagnosis Date  . Arthritis   . GERD (gastroesophageal reflux disease)   . H/O hiatal hernia    "FOR YEARS" " NO PROBLEM"  . Hyperlipidemia   . Hypertension   . Knee pain   . Neuropathy    PERIPHERAL   . Osteoporosis   . PMR (polymyalgia rheumatica) (HCC)   . Polymyalgia ([place]) 5 YEARS   Past Surgical History:  Procedure Laterality Date  . CARPAL TUNNEL RELEASE    . CATARACT EXTRACTION     right  . COLONOSCOPY  2007  . INGUINAL HERNIA REPAIR  10/14/2011   Procedure: HERNIA REPAIR INGUINAL ADULT;  Surgeon: [name], MD;  Location: [facility];  Service: General;  Laterality: Right;  Right Inguinal Hernia Repair with Mesh  . TONSILLECTOMY    . VARICOSE VEIN SURGERY     Social History   Socioeconomic History  . Marital status: Widowed    Spouse name: Not on file  . Number of children: Not on file  . Years of education: Not on file  . Highest education level: Not on file  Occupational History  . Occupation: retired    Fish farm manager: RETIRED  Social Needs  . Financial resource strain: Not on file  . Food insecurity:    Worry: Not on file    Inability: Not on file  . Transportation needs:    Medical: Not on file    Non-medical: Not on file  Tobacco Use  . Smoking status: Never Smoker  . Smokeless tobacco: Never Used  Substance and Sexual Activity  . Alcohol use: Yes    Alcohol/week: 3.0  standard drinks    Types: 3 Glasses of wine per week    Comment: half glass wine before dinner  . Drug use: No  . Sexual activity: Yes  Lifestyle  . Physical activity:    Days per week: Not on file    Minutes per session: Not on file  . Stress: Not on file  Relationships  . Social connections:    Talks on phone: Not on file    Gets together: Not on file    Attends religious service: Not on file    Active member of club or organization: Not on file    Attends meetings of clubs or organizations: Not on file    Relationship status: Not on file  Other Topics Concern  . Not on file  Social History Narrative  . Not on file   Allergies  Allergen Reactions  . Statins Other (See Comments)    Muscle pain and upset stomach   Family History  Problem Relation Age of Onset  . Heart disease Father   . Coronary artery disease Unknown      Current Outpatient Medications (Cardiovascular):  .  amLODipine (NORVASC) 5 MG tablet, TAKE 1 TABLET DAILY .  benazepril-hydrochlorthiazide (LOTENSIN HCT) 10-12.5  MG tablet, TAKE 1 TABLET DAILY        *PATIENT NEEDS AN          APPOINTMENT*   Current Outpatient Medications (Analgesics):  .  aspirin 81 MG tablet, Take 81 mg by mouth daily.    Current Outpatient Medications (Other):  .  famotidine (PEPCID) 20 MG tablet, TAKE 1 TABLET AT BEDTIME .  finasteride (PROSCAR) 5 MG tablet, TAKE 1 TABLET EACH DAY. Marland Kitchen  gabapentin (NEURONTIN) 100 MG capsule, TAKE 1 CAPSULE 4 TIMES     DAILY .  niacin 500 MG tablet, Take 500 mg by mouth 2 (two) times daily with a meal.  .  polyethylene glycol (MIRALAX / GLYCOLAX) packet, Take 17 g by mouth daily as needed for moderate constipation. .  vitamin C (ASCORBIC ACID) 500 MG tablet, Take 500 mg by mouth daily.    Past medical history, social, surgical and family history all reviewed in electronic medical record.  No pertanent information unless stated regarding to the chief complaint.   Review of Systems:  No  headache, visual changes, nausea, vomiting, diarrhea, constipation, dizziness, abdominal pain, skin rash, fevers, chills, night sweats, weight loss, swollen lymph nodes, , muscle aches, chest pain, shortness of breath, mood changes.  Positive joint swelling, body aches  Objective  Blood pressure (!) 122/56, pulse 79, height 6\' 1"  (1.854 m), weight 197 lb (89.4 kg), SpO2 92 %.   General: No apparent distress alert and oriented x3 mood and affect normal, dressed appropriately.  HEENT: Pupils equal, extraocular movements intact  Respiratory: Patient's speak in full sentences and does not appear short of breath  Cardiovascular: 2+lower extremity edema, non tender, no erythema  Skin: Warm dry intact with no signs of infection or rash on extremities or on axial skeleton.  Abdomen: Soft nontender  Neuro: Cranial nerves II through XII are intact, neurovascularly intact in all extremities with 2+ DTRs and 2+ pulses.  Lymph: No lymphadenopathy of posterior or anterior cervical chain or axillae bilaterally.  Gait severely antalgic gait MSK:  tender with moderate loss of range of motion and stability and strength but good tone of shoulders, elbows, wrist, hip, and ankles bilaterally.  Knee: Right valgus deformity noted. Large thigh to calf ratio.  Tender to palpation over medial and PF joint line.  ROM full in flexion and extension and lower leg rotation. instability with valgus force.  painful patellar compression. Patellar glide with moderate crepitus. Patellar and quadriceps tendons unremarkable. Hamstring and quadriceps strength is normal. Contralateral knee shows operative changes as well.  After informed written and verbal consent, patient was seated on exam table. Right knee was prepped with alcohol swab and utilizing anterolateral approach, patient's right knee space was injected with15 mg/2.5 mL of Orthovisc(sodium hyaluronate) in a prefilled syringe was injected easily into the knee through a  22-gauge needle..Patient tolerated the procedure well without immediate complications.    Impression and Recommendations:     This case required medical decision making of moderate complexity. The above documentation has been reviewed and is accurate and complete Lyndal Pulley, DO       Note: This dictation was prepared with Dragon dictation along with smaller phrase technology. Any transcriptional errors that result from this process are unintentional.        \

## 2018-01-21 ENCOUNTER — Ambulatory Visit (INDEPENDENT_AMBULATORY_CARE_PROVIDER_SITE_OTHER): Payer: Medicare Other | Admitting: Family Medicine

## 2018-01-21 ENCOUNTER — Other Ambulatory Visit: Payer: Self-pay | Admitting: Family Medicine

## 2018-01-21 ENCOUNTER — Encounter: Payer: Self-pay | Admitting: Family Medicine

## 2018-01-21 DIAGNOSIS — M1711 Unilateral primary osteoarthritis, right knee: Secondary | ICD-10-CM | POA: Diagnosis not present

## 2018-01-21 NOTE — Addendum Note (Signed)
Addended by: Lyndal Pulley on: 01/21/2018 08:17 AM   Modules accepted: Level of Service

## 2018-01-21 NOTE — Patient Instructions (Signed)
Good to see you  Ice is your friend 2 down and 2 to go  See you soon

## 2018-01-21 NOTE — Progress Notes (Signed)
[name] [place], [place] [address] Phone: [phone] Subjective:    I Darin [name] am serving as a Education administrator for Dr. Hulan Saas.   CC: Right knee pain  [id number]  Darin [name] is a 84 y.o. male coming in with complaint of right knee pain. States that his knee is getting better. Here for injection.  Here for second in a series of 4 injections for Visco supplementation.  Mild improvement with the first 1    Past Medical History:  Diagnosis Date  . Arthritis   . GERD (gastroesophageal reflux disease)   . H/O hiatal hernia    "FOR YEARS" " NO PROBLEM"  . Hyperlipidemia   . Hypertension   . Knee pain   . Neuropathy    PERIPHERAL   . Osteoporosis   . PMR (polymyalgia rheumatica) (HCC)   . Polymyalgia ([place]) 5 YEARS   Past Surgical History:  Procedure Laterality Date  . CARPAL TUNNEL RELEASE    . CATARACT EXTRACTION     right  . COLONOSCOPY  2007  . INGUINAL HERNIA REPAIR  10/14/2011   Procedure: HERNIA REPAIR INGUINAL ADULT;  Surgeon: [name], MD;  Location: [facility];  Service: General;  Laterality: Right;  Right Inguinal Hernia Repair with Mesh  . TONSILLECTOMY    . VARICOSE VEIN SURGERY     Social History   Socioeconomic History  . Marital status: Widowed    Spouse name: Not on file  . Number of children: Not on file  . Years of education: Not on file  . Highest education level: Not on file  Occupational History  . Occupation: retired    Fish farm manager: RETIRED  Social Needs  . Financial resource strain: Not on file  . Food insecurity:    Worry: Not on file    Inability: Not on file  . Transportation needs:    Medical: Not on file    Non-medical: Not on file  Tobacco Use  . Smoking status: Never Smoker  . Smokeless tobacco: Never Used  Substance and Sexual Activity  . Alcohol use: Yes    Alcohol/week: 3.0 standard drinks    Types: 3 Glasses of wine per week    Comment: half glass wine before  dinner  . Drug use: No  . Sexual activity: Yes  Lifestyle  . Physical activity:    Days per week: Not on file    Minutes per session: Not on file  . Stress: Not on file  Relationships  . Social connections:    Talks on phone: Not on file    Gets together: Not on file    Attends religious service: Not on file    Active member of club or organization: Not on file    Attends meetings of clubs or organizations: Not on file    Relationship status: Not on file  Other Topics Concern  . Not on file  Social History Narrative  . Not on file   Allergies  Allergen Reactions  . Statins Other (See Comments)    Muscle pain and upset stomach   Family History  Problem Relation Age of Onset  . Heart disease Father   . Coronary artery disease Unknown      Current Outpatient Medications (Cardiovascular):  .  amLODipine (NORVASC) 5 MG tablet, TAKE 1 TABLET DAILY .  benazepril-hydrochlorthiazide (LOTENSIN HCT) 10-12.5 MG tablet, TAKE 1 TABLET DAILY        *PATIENT NEEDS AN  APPOINTMENT*   Current Outpatient Medications (Analgesics):  .  aspirin 81 MG tablet, Take 81 mg by mouth daily.    Current Outpatient Medications (Other):  .  famotidine (PEPCID) 20 MG tablet, TAKE 1 TABLET AT BEDTIME .  finasteride (PROSCAR) 5 MG tablet, TAKE 1 TABLET EACH DAY. Marland Kitchen  gabapentin (NEURONTIN) 100 MG capsule, TAKE 1 CAPSULE 4 TIMES     DAILY .  niacin 500 MG tablet, Take 500 mg by mouth 2 (two) times daily with a meal.  .  polyethylene glycol (MIRALAX / GLYCOLAX) packet, Take 17 g by mouth daily as needed for moderate constipation. .  vitamin C (ASCORBIC ACID) 500 MG tablet, Take 500 mg by mouth daily.    Past medical history, social, surgical and family history all reviewed in electronic medical record.  No pertanent information unless stated regarding to the chief complaint.   Review of Systems:  No headache, visual changes, nausea, vomiting, diarrhea, constipation, dizziness, abdominal pain,  skin rash, fevers, chills, night sweats, weight loss, swollen lymph nodes, body aches, joint swelling, muscle aches, chest pain, shortness of breath, mood changes.   Objective  Blood pressure 124/60, pulse 76, height 6\' 1"  (1.854 m), weight 198 lb (89.8 kg), SpO2 96 %.    General: No apparent distress alert and oriented x3 mood and affect normal, dressed appropriately.   After informed written and verbal consent, patient was seated on exam table. Right knee was prepped with alcohol swab and utilizing anterolateral approach, patient's right knee space was injected with15 mg/2.5 mL of Orthovisc(sodium hyaluronate) in a prefilled syringe was injected easily into the knee through a 22-gauge needle..Patient tolerated the procedure well without immediate complications.    Impression and Recommendations:     \ The above documentation has been reviewed and is accurate and complete Lyndal Pulley, DO       Note: This dictation was prepared with Dragon dictation along with smaller phrase technology. Any transcriptional errors that result from this process are unintentional.

## 2018-01-21 NOTE — Assessment & Plan Note (Signed)
Orthovisc injection given again today.  Discussed icing regimen and home exercise.  Patient will follow-up in 1 week for third in a series of 4 injections.

## 2018-01-27 NOTE — Progress Notes (Signed)
[name] [place], [place] [address] Phone: [phone] Subjective:   [name], am serving as a scribe for Dr. Hulan Saas.  I'm seeing this patient by the request  of:    CC: right knee pain   [id number]  Darin [name] is a 84 y.o. male coming in with complaint of right knee pain. He is here for 3 of 4 orthovisc injections. He does not notice as much improvement after the 2nd injection as he had from the first series.         Past Medical History:  Diagnosis Date  . Arthritis   . GERD (gastroesophageal reflux disease)   . H/O hiatal hernia    "FOR YEARS" " NO PROBLEM"  . Hyperlipidemia   . Hypertension   . Knee pain   . Neuropathy    PERIPHERAL   . Osteoporosis   . PMR (polymyalgia rheumatica) (HCC)   . Polymyalgia ([place]) 5 YEARS   Past Surgical History:  Procedure Laterality Date  . CARPAL TUNNEL RELEASE    . CATARACT EXTRACTION     right  . COLONOSCOPY  2007  . INGUINAL HERNIA REPAIR  10/14/2011   Procedure: HERNIA REPAIR INGUINAL ADULT;  Surgeon: [name], MD;  Location: [facility];  Service: General;  Laterality: Right;  Right Inguinal Hernia Repair with Mesh  . TONSILLECTOMY    . VARICOSE VEIN SURGERY     Social History   Socioeconomic History  . Marital status: Widowed    Spouse name: Not on file  . Number of children: Not on file  . Years of education: Not on file  . Highest education level: Not on file  Occupational History  . Occupation: retired    Fish farm manager: RETIRED  Social Needs  . Financial resource strain: Not on file  . Food insecurity:    Worry: Not on file    Inability: Not on file  . Transportation needs:    Medical: Not on file    Non-medical: Not on file  Tobacco Use  . Smoking status: Never Smoker  . Smokeless tobacco: Never Used  Substance and Sexual Activity  . Alcohol use: Yes    Alcohol/week: 3.0 standard drinks    Types: 3 Glasses of wine per week    Comment:  half glass wine before dinner  . Drug use: No  . Sexual activity: Yes  Lifestyle  . Physical activity:    Days per week: Not on file    Minutes per session: Not on file  . Stress: Not on file  Relationships  . Social connections:    Talks on phone: Not on file    Gets together: Not on file    Attends religious service: Not on file    Active member of club or organization: Not on file    Attends meetings of clubs or organizations: Not on file    Relationship status: Not on file  Other Topics Concern  . Not on file  Social History Narrative  . Not on file   Allergies  Allergen Reactions  . Statins Other (See Comments)    Muscle pain and upset stomach   Family History  Problem Relation Age of Onset  . Heart disease Father   . Coronary artery disease Unknown      Current Outpatient Medications (Cardiovascular):  .  amLODipine (NORVASC) 5 MG tablet, TAKE 1 TABLET DAILY .  benazepril-hydrochlorthiazide (LOTENSIN HCT) 10-12.5 MG tablet, TAKE 1  TABLET DAILY        *PATIENT NEEDS AN          APPOINTMENT*   Current Outpatient Medications (Analgesics):  .  aspirin 81 MG tablet, Take 81 mg by mouth daily.    Current Outpatient Medications (Other):  .  famotidine (PEPCID) 20 MG tablet, TAKE 1 TABLET AT BEDTIME .  finasteride (PROSCAR) 5 MG tablet, TAKE 1 TABLET EACH DAY. Marland Kitchen  gabapentin (NEURONTIN) 100 MG capsule, TAKE 1 CAPSULE 4 TIMES     DAILY .  niacin 500 MG tablet, Take 500 mg by mouth 2 (two) times daily with a meal.  .  polyethylene glycol (MIRALAX / GLYCOLAX) packet, Take 17 g by mouth daily as needed for moderate constipation. .  vitamin C (ASCORBIC ACID) 500 MG tablet, Take 500 mg by mouth daily.    Past medical history, social, surgical and family history all reviewed in electronic medical record.  No pertanent information unless stated regarding to the chief complaint.   After informed written and verbal consent, patient was seated on exam table. Right knee was  prepped with alcohol swab and utilizing anterolateral approach, patient's right knee space was injected with15 mg/2.5 mL of Orthovisc(sodium hyaluronate) in a prefilled syringe was injected easily into the knee through a 22-gauge needle..Patient tolerated the procedure well without immediate complications.   Impression and Recommendations:      The above documentation has been reviewed and is accurate and complete Lyndal Pulley, DO       Note: This dictation was prepared with Dragon dictation along with smaller phrase technology. Any transcriptional errors that result from this process are unintentional.

## 2018-01-29 ENCOUNTER — Encounter: Payer: Self-pay | Admitting: Family Medicine

## 2018-01-29 ENCOUNTER — Ambulatory Visit (INDEPENDENT_AMBULATORY_CARE_PROVIDER_SITE_OTHER): Payer: Medicare Other | Admitting: Family Medicine

## 2018-01-29 DIAGNOSIS — M1711 Unilateral primary osteoarthritis, right knee: Secondary | ICD-10-CM

## 2018-01-29 NOTE — Assessment & Plan Note (Signed)
Orthovisc injection on the right side.  Responded well.  Discussed icing regimen and home exercises.  Discussed which activities to do which wants to avoid.  Follow-up in 1 week for fourth and final injection

## 2018-02-04 NOTE — Progress Notes (Signed)
[name] [address], [place] [address] Phone: [phone] Subjective:    I [name] am serving as a Education administrator for Dr. Hulan Saas.    CC: Right knee pain  YHC:[id number]  Darin [name] is a 84 y.o. male coming in with complaint of right knee pain. Here for injection.  Viscosupplementation getting a little better. Weight bearing causes the knee to lock. Right wrist is painful. Doesn't remember what happened. Has lost some ROM. History of broken wrist on the right side. States he barely has numbness and tingling in the fingertips.   Onset- 1-2 months  Location- distal radius   Character- Achy  Aggravating factors-  Gripping, lifting Reliving factors-  Therapies tried-  Severity-     Past Medical History:  Diagnosis Date  . Arthritis   . GERD (gastroesophageal reflux disease)   . H/O hiatal hernia    "FOR YEARS" " NO PROBLEM"  . Hyperlipidemia   . Hypertension   . Knee pain   . Neuropathy    PERIPHERAL   . Osteoporosis   . PMR (polymyalgia rheumatica) (HCC)   . Polymyalgia ([place]) 5 YEARS   Past Surgical History:  Procedure Laterality Date  . CARPAL TUNNEL RELEASE    . CATARACT EXTRACTION     right  . COLONOSCOPY  2007  . INGUINAL HERNIA REPAIR  10/14/2011   Procedure: HERNIA REPAIR INGUINAL ADULT;  Surgeon: [name], MD;  Location: [facility];  Service: General;  Laterality: Right;  Right Inguinal Hernia Repair with Mesh  . TONSILLECTOMY    . VARICOSE VEIN SURGERY     Social History   Socioeconomic History  . Marital status: Widowed    Spouse name: Not on file  . Number of children: Not on file  . Years of education: Not on file  . Highest education level: Not on file  Occupational History  . Occupation: retired    Fish farm manager: RETIRED  Social Needs  . Financial resource strain: Not on file  . Food insecurity:    Worry: Not on file    Inability: Not on file  . Transportation needs:    Medical: Not on  file    Non-medical: Not on file  Tobacco Use  . Smoking status: Never Smoker  . Smokeless tobacco: Never Used  Substance and Sexual Activity  . Alcohol use: Yes    Alcohol/week: 3.0 standard drinks    Types: 3 Glasses of wine per week    Comment: half glass wine before dinner  . Drug use: No  . Sexual activity: Yes  Lifestyle  . Physical activity:    Days per week: Not on file    Minutes per session: Not on file  . Stress: Not on file  Relationships  . Social connections:    Talks on phone: Not on file    Gets together: Not on file    Attends religious service: Not on file    Active member of club or organization: Not on file    Attends meetings of clubs or organizations: Not on file    Relationship status: Not on file  Other Topics Concern  . Not on file  Social History Narrative  . Not on file   Allergies  Allergen Reactions  . Statins Other (See Comments)    Muscle pain and upset stomach   Family History  Problem Relation Age of Onset  . Heart disease Father   . Coronary artery disease Unknown  Current Outpatient Medications (Cardiovascular):  .  amLODipine (NORVASC) 5 MG tablet, TAKE 1 TABLET DAILY .  benazepril-hydrochlorthiazide (LOTENSIN HCT) 10-12.5 MG tablet, TAKE 1 TABLET DAILY        *PATIENT NEEDS AN          APPOINTMENT*   Current Outpatient Medications (Analgesics):  .  aspirin 81 MG tablet, Take 81 mg by mouth daily.    Current Outpatient Medications (Other):  .  famotidine (PEPCID) 20 MG tablet, TAKE 1 TABLET AT BEDTIME .  finasteride (PROSCAR) 5 MG tablet, TAKE 1 TABLET EACH DAY. Marland Kitchen  gabapentin (NEURONTIN) 100 MG capsule, TAKE 1 CAPSULE 4 TIMES     DAILY .  niacin 500 MG tablet, Take 500 mg by mouth 2 (two) times daily with a meal.  .  polyethylene glycol (MIRALAX / GLYCOLAX) packet, Take 17 g by mouth daily as needed for moderate constipation. .  vitamin C (ASCORBIC ACID) 500 MG tablet, Take 500 mg by mouth daily.    Past medical  history, social, surgical and family history all reviewed in electronic medical record.  No pertanent information unless stated regarding to the chief complaint.   Review of Systems:  No headache, visual changes, nausea, vomiting, diarrhea, constipation, dizziness, abdominal pain, skin rash, fevers, chills, night sweats, weight loss, swollen lymph nodes, body aches, joint swelling, muscle aches, chest pain, shortness of breath, mood changes.   Objective  Blood pressure 110/70, pulse 68, height 6\' 1"  (1.854 m), weight 196 lb (88.9 kg), SpO2 97 %.    General: No apparent distress alert and oriented x3 mood and affect normal, dressed appropriately.  HEENT: Pupils equal, extraocular movements intact  Respiratory: Patient's speak in full sentences and does not appear short of breath  Cardiovascular: No lower extremity edema, non tender, no erythema  Skin: Warm dry intact with no signs of infection or rash on extremities or on axial skeleton.  Abdomen: Soft nontender  Neuro: Cranial nerves II through XII are intact, neurovascularly intact in all extremities with 2+ DTRs and 2+ pulses.  Lymph: No lymphadenopathy of posterior or anterior cervical chain or axillae bilaterally.  Gait slow antalgic gait with a walker MSK:  tender with limited range of motion and good stability and symmetric strength and tone of shoulders, elbows,  hip, and ankles bilaterally.  Right wrist has some mild decrease in range of motion but overall fairly unremarkable.  Mild positive grind test of the [facility] joint.  Knee: Right valgus deformity noted. Large thigh to calf ratio.  Tender to palpation over medial and PF joint line.  ROM full in flexion and extension and lower leg rotation. instability with valgus force.  painful patellar compression. Patellar glide with moderate crepitus. Patellar and quadriceps tendons unremarkable. Hamstring and quadriceps strength is normal. Contralateral knee arthritic changes as  well  After informed written and verbal consent, patient was seated on exam table. Right knee was prepped with alcohol swab and utilizing anterolateral approach, patient's right knee space was injected with15 mg/2.5 mL of Orthovisc(sodium hyaluronate) in a prefilled syringe was injected easily into the knee through a 22-gauge needle..Patient tolerated the procedure well without immediate complications.    Impression and Recommendations:     This case required medical decision making of moderate complexity. The above documentation has been reviewed and is accurate and complete Lyndal Pulley, DO       Note: This dictation was prepared with Dragon dictation along with smaller phrase technology. Any transcriptional errors that result from this  process are unintentional.

## 2018-02-05 ENCOUNTER — Encounter: Payer: Self-pay | Admitting: Family Medicine

## 2018-02-05 ENCOUNTER — Ambulatory Visit (INDEPENDENT_AMBULATORY_CARE_PROVIDER_SITE_OTHER): Payer: Medicare Other | Admitting: Family Medicine

## 2018-02-05 DIAGNOSIS — M25531 Pain in right wrist: Secondary | ICD-10-CM | POA: Diagnosis not present

## 2018-02-05 DIAGNOSIS — M1711 Unilateral primary osteoarthritis, right knee: Secondary | ICD-10-CM

## 2018-02-05 NOTE — Assessment & Plan Note (Signed)
Patient finished with Orthovisc supplementation.  Discussed what to expect at this point.  Patient has done a series previously.  Discussed icing regimen and home exercises.  Discussed which activities to do which wants to avoid.  Follow-up again in 4 to 8 weeks

## 2018-02-05 NOTE — Patient Instructions (Signed)
ood to see you  Try the wrist brace with a lot of activity and night for 2 weeks I think the cold weather is playing a role  See me again in 3-4 weeks if the wrist still hurts

## 2018-02-05 NOTE — Assessment & Plan Note (Signed)
Arthritic changes, seems to be more over the [facility] joint.  Brace given today.  Range of motion exercises.  Worsening symptoms consider ultrasound at follow-up.

## 2018-02-12 ENCOUNTER — Ambulatory Visit: Payer: Medicare Other | Admitting: Family Medicine

## 2018-03-02 NOTE — Progress Notes (Signed)
[name] [address], [place] [address] Phone: [phone] Subjective:   [name], am serving as a scribe for Dr. Hulan Saas.  I'm seeing this patient by the request  of:    CC: Bilateral knee pain follow-up  HGD:[id number]  Darin [name] is a 84 y.o. male coming in with complaint of right wrist pain and knee pain. He has been using the splint at night which as alleviated his pain.   Right knee is worse. He feels like he has not had any improvement after the visco supplements.      Past Medical History:  Diagnosis Date  . Arthritis   . GERD (gastroesophageal reflux disease)   . H/O hiatal hernia    "FOR YEARS" " NO PROBLEM"  . Hyperlipidemia   . Hypertension   . Knee pain   . Neuropathy    PERIPHERAL   . Osteoporosis   . PMR (polymyalgia rheumatica) (HCC)   . Polymyalgia ([place]) 5 YEARS   Past Surgical History:  Procedure Laterality Date  . CARPAL TUNNEL RELEASE    . CATARACT EXTRACTION     right  . COLONOSCOPY  2007  . INGUINAL HERNIA REPAIR  10/14/2011   Procedure: HERNIA REPAIR INGUINAL ADULT;  Surgeon: [name], MD;  Location: [facility];  Service: General;  Laterality: Right;  Right Inguinal Hernia Repair with Mesh  . TONSILLECTOMY    . VARICOSE VEIN SURGERY     Social History   Socioeconomic History  . Marital status: Widowed    Spouse name: Not on file  . Number of children: Not on file  . Years of education: Not on file  . Highest education level: Not on file  Occupational History  . Occupation: retired    Fish farm manager: RETIRED  Social Needs  . Financial resource strain: Not on file  . Food insecurity:    Worry: Not on file    Inability: Not on file  . Transportation needs:    Medical: Not on file    Non-medical: Not on file  Tobacco Use  . Smoking status: Never Smoker  . Smokeless tobacco: Never Used  Substance and Sexual Activity  . Alcohol use: Yes    Alcohol/week: 3.0 standard drinks   Types: 3 Glasses of wine per week    Comment: half glass wine before dinner  . Drug use: No  . Sexual activity: Yes  Lifestyle  . Physical activity:    Days per week: Not on file    Minutes per session: Not on file  . Stress: Not on file  Relationships  . Social connections:    Talks on phone: Not on file    Gets together: Not on file    Attends religious service: Not on file    Active member of club or organization: Not on file    Attends meetings of clubs or organizations: Not on file    Relationship status: Not on file  Other Topics Concern  . Not on file  Social History Narrative  . Not on file   Allergies  Allergen Reactions  . Statins Other (See Comments)    Muscle pain and upset stomach   Family History  Problem Relation Age of Onset  . Heart disease Father   . Coronary artery disease Unknown      Current Outpatient Medications (Cardiovascular):  .  amLODipine (NORVASC) 5 MG tablet, TAKE 1 TABLET DAILY .  benazepril-hydrochlorthiazide (LOTENSIN HCT) 10-12.5 MG  tablet, TAKE 1 TABLET DAILY        *PATIENT NEEDS AN          APPOINTMENT*   Current Outpatient Medications (Analgesics):  .  aspirin 81 MG tablet, Take 81 mg by mouth daily.    Current Outpatient Medications (Other):  .  famotidine (PEPCID) 20 MG tablet, TAKE 1 TABLET AT BEDTIME .  finasteride (PROSCAR) 5 MG tablet, TAKE 1 TABLET EACH DAY. Marland Kitchen  gabapentin (NEURONTIN) 100 MG capsule, TAKE 1 CAPSULE 4 TIMES     DAILY .  niacin 500 MG tablet, Take 500 mg by mouth 2 (two) times daily with a meal.  .  polyethylene glycol (MIRALAX / GLYCOLAX) packet, Take 17 g by mouth daily as needed for moderate constipation. .  vitamin C (ASCORBIC ACID) 500 MG tablet, Take 500 mg by mouth daily.    Past medical history, social, surgical and family history all reviewed in electronic medical record.  No pertanent information unless stated regarding to the chief complaint.   Review of Systems:  No headache, visual  changes, nausea, vomiting, diarrhea, constipation, dizziness, abdominal pain, skin rash, fevers, chills, night sweats, weight loss, swollen lymph nodes, body aches,  chest pain, shortness of breath, mood changes.  Positive muscle aches and joint swelling  Objective  There were no vitals taken for this visit. Systems examined below as of    General: No apparent distress alert and oriented x3 mood and affect normal, dressed appropriately.  HEENT: Pupils equal, extraocular movements intact  Respiratory: Patient's speak in full sentences and does not appear short of breath  Cardiovascular: Trace lower extremity edema, non tender, no erythema  Skin: Warm dry intact with no signs of infection or rash on extremities or on axial skeleton.  Abdomen: Soft nontender  Neuro: Cranial nerves II through XII are intact, neurovascularly intact in all extremities with 2+ DTRs and 2+ pulses.  Lymph: No lymphadenopathy of posterior or anterior cervical chain or axillae bilaterally.  Gait antalgic walking with the aid of a cane MSK: Severe arthritic changes of multiple joints  Knee: Bilateral valgus deformity noted. Large thigh to calf ratio.  Tender to palpation over medial and PF joint line.  Decreased range of motion by 5 degrees of extension and 10 degrees of flexion. instability with valgus force.  painful patellar compression. Patellar glide with moderate crepitus. Patellar and quadriceps tendons unremarkable. Hamstring and quadriceps strength 3+ out of 5 but symmetric    Impression and Recommendations:     This case required medical decision making of moderate complexity. The above documentation has been reviewed and is accurate and complete Lyndal Pulley, DO       Note: This dictation was prepared with Dragon dictation along with smaller phrase technology. Any transcriptional errors that result from this process are unintentional.

## 2018-03-04 ENCOUNTER — Encounter: Payer: Self-pay | Admitting: Family Medicine

## 2018-03-04 ENCOUNTER — Ambulatory Visit (INDEPENDENT_AMBULATORY_CARE_PROVIDER_SITE_OTHER): Payer: Medicare Other | Admitting: Family Medicine

## 2018-03-04 VITALS — BP 140/68 | HR 73 | Ht 73.0 in | Wt 198.0 lb

## 2018-03-04 DIAGNOSIS — M1711 Unilateral primary osteoarthritis, right knee: Secondary | ICD-10-CM

## 2018-03-04 NOTE — Patient Instructions (Signed)
Good to see you  Sorry not a lot better Ice still is your friend You should here from PT soon Happy holidays!  Happy New Year!  See me again early February and we will inject the  Knees again to get you ready for [facility]

## 2018-03-04 NOTE — Assessment & Plan Note (Addendum)
Still having difficulty at this time.  Discussed with patient in great length.  Patient though does not want to try anything else experimental such as PRP.  Patient would like to hold off for any type of steroid injections otherwise.  Due to the weakness that patient though does have patient will restart with formal physical therapy which patient has done relatively well with previously.  Hopefully patient will do well with balance and coordination.  Follow-up with me again 4 to 8 weeks spent  25 minutes with patient face-to-face and had greater than 50% of counseling including as described above in assessment and plan.

## 2018-03-06 ENCOUNTER — Other Ambulatory Visit: Payer: Self-pay | Admitting: Family Medicine

## 2018-03-12 ENCOUNTER — Encounter: Payer: Self-pay | Admitting: Family Medicine

## 2018-03-12 ENCOUNTER — Other Ambulatory Visit: Payer: Self-pay

## 2018-03-12 ENCOUNTER — Other Ambulatory Visit: Payer: Self-pay | Admitting: Family Medicine

## 2018-03-12 ENCOUNTER — Ambulatory Visit (INDEPENDENT_AMBULATORY_CARE_PROVIDER_SITE_OTHER): Payer: Medicare Other | Admitting: Family Medicine

## 2018-03-12 ENCOUNTER — Encounter: Payer: Self-pay | Admitting: Physical Therapy

## 2018-03-12 ENCOUNTER — Ambulatory Visit: Payer: Medicare Other | Attending: Family Medicine | Admitting: Physical Therapy

## 2018-03-12 DIAGNOSIS — M6281 Muscle weakness (generalized): Secondary | ICD-10-CM | POA: Insufficient documentation

## 2018-03-12 DIAGNOSIS — M25661 Stiffness of right knee, not elsewhere classified: Secondary | ICD-10-CM | POA: Insufficient documentation

## 2018-03-12 DIAGNOSIS — I10 Essential (primary) hypertension: Secondary | ICD-10-CM

## 2018-03-12 DIAGNOSIS — M25561 Pain in right knee: Secondary | ICD-10-CM | POA: Insufficient documentation

## 2018-03-12 DIAGNOSIS — G8929 Other chronic pain: Secondary | ICD-10-CM | POA: Insufficient documentation

## 2018-03-12 DIAGNOSIS — M1711 Unilateral primary osteoarthritis, right knee: Secondary | ICD-10-CM

## 2018-03-12 NOTE — Patient Instructions (Signed)
Good to see you  Happy holidays!  See me again in 4 weeks

## 2018-03-12 NOTE — Patient Instructions (Signed)
Access Code: [email or web address]  URL: [email or web address]  Date: 03/12/2018  Prepared by: Ruben Im   Exercises  Supine Straight Leg Raises - 5 reps - 1 sets - 1x daily - 7x weekly  Supine Heel Slide with Small Ball - 10 reps - 1 sets - 1x daily - 7x weekly  Supine Quad Set on Towel Roll - 10 reps - 1 sets - 1x daily - 7x weekly

## 2018-03-12 NOTE — Progress Notes (Signed)
[name] [name], [place] [address] Phone: [phone] Subjective:    I'm seeing this patient by the request  of:    CC: Right knee pain  ACZ:[id number]  Darin [name] is a 84 y.o. male coming in with complaint of right knee pain. PRP. Severe arthritis in the knee.  Has failed all other conservative therapy at this time.  Does not want any surgical intervention     Past Medical History:  Diagnosis Date  . Arthritis   . GERD (gastroesophageal reflux disease)   . H/O hiatal hernia    "FOR YEARS" " NO PROBLEM"  . Hyperlipidemia   . Hypertension   . Knee pain   . Neuropathy    PERIPHERAL   . Osteoporosis   . PMR (polymyalgia rheumatica) (HCC)   . Polymyalgia ([place]) 5 YEARS   Past Surgical History:  Procedure Laterality Date  . CARPAL TUNNEL RELEASE    . CATARACT EXTRACTION     right  . COLONOSCOPY  2007  . INGUINAL HERNIA REPAIR  10/14/2011   Procedure: HERNIA REPAIR INGUINAL ADULT;  Surgeon: [name], MD;  Location: [facility];  Service: General;  Laterality: Right;  Right Inguinal Hernia Repair with Mesh  . TONSILLECTOMY    . VARICOSE VEIN SURGERY     Social History   Socioeconomic History  . Marital status: Widowed    Spouse name: Not on file  . Number of children: Not on file  . Years of education: Not on file  . Highest education level: Not on file  Occupational History  . Occupation: retired    Fish farm manager: RETIRED  Social Needs  . Financial resource strain: Not on file  . Food insecurity:    Worry: Not on file    Inability: Not on file  . Transportation needs:    Medical: Not on file    Non-medical: Not on file  Tobacco Use  . Smoking status: Never Smoker  . Smokeless tobacco: Never Used  Substance and Sexual Activity  . Alcohol use: Yes    Alcohol/week: 3.0 standard drinks    Types: 3 Glasses of wine per week    Comment: half glass wine before dinner  . Drug use: No  . Sexual activity: Yes    Lifestyle  . Physical activity:    Days per week: Not on file    Minutes per session: Not on file  . Stress: Not on file  Relationships  . Social connections:    Talks on phone: Not on file    Gets together: Not on file    Attends religious service: Not on file    Active member of club or organization: Not on file    Attends meetings of clubs or organizations: Not on file    Relationship status: Not on file  Other Topics Concern  . Not on file  Social History Narrative  . Not on file   Allergies  Allergen Reactions  . Statins Other (See Comments)    Muscle pain and upset stomach   Family History  Problem Relation Age of Onset  . Heart disease Father   . Coronary artery disease Unknown      Current Outpatient Medications (Cardiovascular):  .  amLODipine (NORVASC) 5 MG tablet, TAKE 1 TABLET DAILY .  benazepril-hydrochlorthiazide (LOTENSIN HCT) 10-12.5 MG tablet, TAKE 1 TABLET DAILY        *PATIENT NEEDS AN  APPOINTMENT*   Current Outpatient Medications (Analgesics):  .  aspirin 81 MG tablet, Take 81 mg by mouth daily.    Current Outpatient Medications (Other):  .  famotidine (PEPCID) 20 MG tablet, TAKE 1 TABLET AT BEDTIME .  finasteride (PROSCAR) 5 MG tablet, TAKE 1 TABLET EACH DAY. Marland Kitchen  gabapentin (NEURONTIN) 100 MG capsule, TAKE 1 CAPSULE 4 TIMES     DAILY .  niacin 500 MG tablet, Take 500 mg by mouth 2 (two) times daily with a meal.  .  polyethylene glycol (MIRALAX / GLYCOLAX) packet, Take 17 g by mouth daily as needed for moderate constipation. .  vitamin C (ASCORBIC ACID) 500 MG tablet, Take 500 mg by mouth daily.    Past medical history, social, surgical and family history all reviewed in electronic medical record.  No pertanent information unless stated regarding to the chief complaint.   Review of Systems:  No headache, visual changes, nausea, vomiting, diarrhea, constipation, dizziness, abdominal pain, skin rash, fevers, chills, night sweats, weight  loss, swollen lymph nodes, body aches, joint swelling, muscle aches, chest pain, shortness of breath, mood changes.   Objective  Blood pressure 120/70, pulse 75, height 6\' 1"  (1.854 m), weight 199 lb (90.3 kg), SpO2 96 %.   General: No apparent distress alert and oriented x3 mood and affect normal, dressed appropriately.  Knee: Right valgus deformity noted. Large thigh to calf ratio.  Tender to palpation over medial and PF joint line.  ROM full in flexion and extension and lower leg rotation. instability with valgus force.  painful patellar compression. Patellar glide with moderate crepitus. Patellar and quadriceps tendons unremarkable. Hamstring and quadriceps strength is normal. Contralateral knee shows arthritic changes as well   After informed written and verbal consent, patient was seated on exam table. Right knee was prepped with alcohol swab and utilizing anterolateral approach, patient's right knee space was injected with 4 pre-centrifuge PRP and a total of 4 cc. Patient tolerated the procedure well without immediate complications.    Impression and Recommendations:      The above documentation has been reviewed and is accurate and complete Lyndal Pulley, DO       Note: This dictation was prepared with Dragon dictation along with smaller phrase technology. Any transcriptional errors that result from this process are unintentional.

## 2018-03-12 NOTE — Assessment & Plan Note (Signed)
PRP given today.  Responded well previously to other injection short-lived.  Hopefully this will do a little better.  We discussed avoiding ice and ibuprofen's.  Follow-up again in 4 to 8 weeks

## 2018-03-12 NOTE — Assessment & Plan Note (Signed)
Patient given PRP injection today.  We discussed with patient that we will do not consider this curative of any osteoarthritic changes but hopefully will help with some of the inflammation and potentially slow down the progression.  Patient does have a traveling trip ready to go home and will be traveling a long time and wants to be more stable.  Patient will follow-up with me again in 4 weeks for further evaluation.

## 2018-03-12 NOTE — Therapy (Signed)
[facility] [address], [place], [place], [address] Phone: [phone]   Fax:  [phone]  Physical Therapy Evaluation  Patient Details  Name: Darin [name] MRN: [id number] Date of Birth: 01/13/1932 Referring Provider (PT): Dr. Hulan Saas   Encounter Date: 03/12/2018  PT End of Session - 03/12/18 1923    Visit Number  1    Date for PT Re-Evaluation  05/07/18    Authorization Type  Medicare    PT Start Time  0845    PT Stop Time  0929    PT Time Calculation (min)  44 min    Activity Tolerance  Patient tolerated treatment well       Past Medical History:  Diagnosis Date  . Arthritis   . GERD (gastroesophageal reflux disease)   . H/O hiatal hernia    "FOR YEARS" " NO PROBLEM"  . Hyperlipidemia   . Hypertension   . Knee pain   . Neuropathy    PERIPHERAL   . Osteoporosis   . PMR (polymyalgia rheumatica) (HCC)   . Polymyalgia ([place]) 5 YEARS    Past Surgical History:  Procedure Laterality Date  . CARPAL TUNNEL RELEASE    . CATARACT EXTRACTION     right  . COLONOSCOPY  2007  . INGUINAL HERNIA REPAIR  10/14/2011   Procedure: HERNIA REPAIR INGUINAL ADULT;  Surgeon: [name], MD;  Location: [facility];  Service: General;  Laterality: Right;  Right Inguinal Hernia Repair with Mesh  . TONSILLECTOMY    . VARICOSE VEIN SURGERY      There were no vitals filed for this visit.   Subjective Assessment - 03/12/18 0848    Subjective  End of May,twisted ankle  while in [place] and crawled to the bathroom which caused injury to  his right knee.  Had a few injections.  Had a steroid injection.  Going to have PRP injections.  Using a RW full time "pretty much."  I'm walking "badly.  My knee stiffens up.  Wore a knee brace for a while but it didn't seem to help much.      Limitations  Walking    How long can you walk comfortably?  200 feet    Diagnostic tests  No cartilage left in my knee.    Patient Stated  Goals  I would like to be free of pain and be able to walk with a cane    Currently in Pain?  Yes    Pain Score  6     Pain Location  Knee    Pain Orientation  Right    Pain Type  Chronic pain    Pain Onset  More than a month ago    Pain Frequency  Constant    Aggravating Factors   it's constant, I never seem to know;  weight bearing; bending knee;  picking up something that feel.    Pain Relieving Factors  sleep         [facility] PT Assessment - 03/12/18 0001      Assessment   Medical Diagnosis  patellofemoral arthritis of right knee     Referring Provider (PT)  Dr. Hulan Saas    Onset Date/Surgical Date  --   May   Next MD Visit  soon for PRP injection     Prior Therapy  > 1 year ago for balance      Precautions   Precautions  Fall      Restrictions  Weight Bearing Restrictions  No      Balance Screen   Has the patient fallen in the past 6 months  No    Has the patient had a decrease in activity level because of a fear of falling?   Yes    Is the patient reluctant to leave their home because of a fear of falling?   No      Home Environment   Living Environment  Private residence    Living Arrangements  Alone    Type of [place] - 2 wheels      Prior Function   Level of Independence  Independent    Vocation  Retired    Leisure  traveling      AROM   Right Knee Extension  6    Right Knee Flexion  112    Left Knee Extension  0    Left Knee Flexion  140      Strength   Right Hip Flexion  4/5    Right Hip ABduction  4-/5    Left Hip Flexion  4+/5    Left Hip ABduction  4/5    Right Knee Flexion  4-/5    Right Knee Extension  3/5    Left Knee Flexion  4-/5    Left Knee Extension  3+/5      Ambulation/Gait   Assistive device  Rolling walker    Gait Pattern  Shuffle    Gait Comments  decreased stride lengths bilaterally      Standardized Balance Assessment   Five times sit to stand comments   needs considerable use of UEs to  rise from a standard chair    10 Meter Walk  16.67 sec      Timed Up and Go Test   Normal TUG (seconds)  19.38    TUG Comments  with RW                Objective measurements completed on examination: See above findings.              PT Education - 03/12/18 1922    Education Details  Access Code: [email or web address]   heel slides in supine or sitting;  SLR and quad sets    Person(s) Educated  Patient    Methods  Explanation;Demonstration;Handout    Comprehension  Returned demonstration;Verbalized understanding       PT Short Term Goals - 03/12/18 1940      PT SHORT TERM GOAL #1   Title  be independent in initial HEP      Time  4    Period  Weeks    Status  New    Target Date  04/09/18      PT SHORT TERM GOAL #2   Title  The patient will have improved right knee ROM 3-120 degrees needed for greater ease getting up and down from the chair and in/out of the car    Time  4    Period  Weeks    Status  New      PT SHORT TERM GOAL #3   Title  The patient will have improved hip flexor muscle length to 5 degrees and HS length to 65 degrees needed for improved stride length with ambulation    Time  4    Period  Weeks    Status  New      PT SHORT TERM GOAL #4  Title  Timed up and Go to 16 sec indicating improved gait safety    Time  4    Period  Weeks    Status  New      PT SHORT TERM GOAL #5   Title  Ten meter walk test to 14 sec indicate improved gait speed needed for community ambulation     Time  4    Status  New        PT Long Term Goals - 03/12/18 1943      PT LONG TERM GOAL #1   Title  be independent in advanced HEP for further improvements in strength, ROM and balance      Time  8    Period  Weeks    Status  New    Target Date  05/07/18      PT LONG TERM GOAL #2   Title  Right knee ROM improved to 2-125 degrees needed for greater mobility     Time  8    Period  Weeks    Status  New      PT LONG TERM GOAL #3   Title  Improved right knee  extension strength to 3+/5 and knee flexion strength to 4/5 needed for greater ease rising    Time  8    Period  Weeks    Status  New      PT LONG TERM GOAL #4   Title  Improved knee pain with usual ADLs by 40%    Time  8    Period  Weeks    Status  New      PT LONG TERM GOAL #5   Title  The patient will be able to ambulate 500 feet in six minutes needed for shorter distance community ambulation for traveling    Time  [place] - 03/12/18 1923    Clinical Impression Statement  The patient reports he injured his right knee while he was traveling in [place].  He had sprained his ankle and had to crawl to bathroom on his knees.  His ankle problem has resolved but he has continued to have anterior knee pain.  He has had multiple injections and has used a knee brace with minimal improvement.  He is having blood drawn this afternoon to begin PRP treatments.  He is using a RW full time but prior to this incident he was able to ambulate without an assistive device.  He has short shuffled steps.   His right knee ROM is limited 6-112 degrees, left knee 0-140 degrees.  Decreased bil HS lengths to 60 degrees bil.  He lacks to LE strength to rise from a regular chair or even a very high surface without significant UE use.  Decreased gait speed with 10 meter walk test 16.67 sec and Timed up and Go 19.38.  He would benefit from PT to address these deficits.       History and Personal Factors relevant to plan of care:  advanced age;  co-morbidities including COPD, HTN, polyneuropathy, chronic LBP;  lives alone but has good social network    Clinical Presentation  Stable    Clinical Decision Making  Low    Rehab Potential  Good    Clinical Impairments Affecting Rehab Potential  risk for falls    PT Frequency  2x / week  PT Duration  8 weeks    PT Treatment/Interventions  ADLs/Self Care Home Management;Cryotherapy;Electrical Stimulation;Ultrasound;Moist  Heat;Iontophoresis 4mg /ml Dexamethasone;Gait training;Stair training;Therapeutic activities;Therapeutic exercise;Patient/family education;Neuromuscular re-education;Manual techniques;Taping;Dry needling    PT Next Visit Plan  do 6 min walk test;  Nu-step; heel slides with floor slider;  LAQ, seated clams, marching  Add HS and hip flexor stretch to HEP   PT Home Exercise Plan  Access Code: [email or web address]     Consulted and Agree with Plan of Care  Patient       Patient will benefit from skilled therapeutic intervention in order to improve the following deficits and impairments:  Pain, Decreased range of motion, Decreased strength, Decreased activity tolerance, Impaired perceived functional ability, Difficulty walking, Decreased balance  Visit Diagnosis: Chronic pain of right knee - Plan: PT plan of care cert/re-cert  Stiffness of right knee, not elsewhere classified - Plan: PT plan of care cert/re-cert  Muscle weakness (generalized) - Plan: PT plan of care cert/re-cert     Problem List Patient Active Problem List   Diagnosis Date Noted  . Right wrist pain 02/05/2018  . Degenerative arthritis of right knee 10/23/2017  . Left leg cellulitis 08/12/2017  . Arthritis of right knee 05/20/2017  . Diverticulosis of colon with hemorrhage   . Acute blood loss anemia   . GIB (gastrointestinal bleeding) 03/12/2017  . Leg edema, right 02/20/2015  . BPH (benign prostatic hyperplasia) 12/09/2014  . Chronic radicular low back pain 08/06/2012  . COPD (chronic obstructive pulmonary disease) ([place]) 04/21/2012  . Syncope 03/29/2012  . Hyponatremia 03/29/2012  . Localized osteoarthrosis not specified whether primary or secondary, lower leg 06/20/2010  . VARICOSE VEINS LOWER EXTREMITIES W/INFLAMMATION 02/12/2010  . [place] LOCALIZED OSTEOARTHROSIS INVOLVING HAND 10/16/2009  . DUPUYTREN'S CONTRACTURE, RIGHT 07/04/2009  . DEGENERATIVE DISC DISEASE, LUMBOSACRAL SPINE W/RADICULOPATHY 06/19/2009  . CARCINOMA, BASAL  CELL 02/07/2009  . ANEMIA OF CHRONIC DISEASE 02/07/2009  . BURSITIS, RIGHT SHOULDER 10/07/2008  . CATARACT ASSOCIATED WITH OTHER SYNDROMES 08/01/2008  . DRY SKIN 08/01/2008  . CERUMEN IMPACTION, BILATERAL 03/01/2008  . PMR (polymyalgia rheumatica) ([place]) 01/18/2008  . CRAMP IN LIMB 01/18/2008  . DEPRESSION, SITUATIONAL, ACUTE 12/28/2007  . CONSTIPATION, DRUG INDUCED 12/28/2007  . POLYNEUROPATHY OTHER DISEASES CLASSIFIED ELSW 09/24/2007  . WEIGHT LOSS, ABNORMAL 01/07/2007  . Hyperlipidemia 09/22/2006  . Essential hypertension 09/22/2006  . GERD (gastroesophageal reflux disease) 09/22/2006  . Osteoarthritis 09/22/2006   Ruben Im, PT 03/12/18 7:50 PM Phone: [phone] Fax: [phone] Alvera Singh 03/12/2018, 7:49 PM  [facility] [address] [place], [place], [address] Phone: [phone]   Fax:  [phone]  Name: Darin [name] MRN: [id number] Date of Birth: 09-02-1931

## 2018-03-20 ENCOUNTER — Ambulatory Visit: Payer: Medicare Other | Admitting: Physical Therapy

## 2018-03-20 ENCOUNTER — Encounter: Payer: Self-pay | Admitting: Physical Therapy

## 2018-03-20 DIAGNOSIS — M6281 Muscle weakness (generalized): Secondary | ICD-10-CM

## 2018-03-20 DIAGNOSIS — M25661 Stiffness of right knee, not elsewhere classified: Secondary | ICD-10-CM

## 2018-03-20 DIAGNOSIS — G8929 Other chronic pain: Secondary | ICD-10-CM | POA: Diagnosis not present

## 2018-03-20 DIAGNOSIS — M25561 Pain in right knee: Secondary | ICD-10-CM | POA: Diagnosis not present

## 2018-03-20 NOTE — Therapy (Signed)
[facility] [address] [place], [place], [address] Phone: [phone]   Fax:  [phone]  Physical Therapy Treatment  Patient Details  Name: Darin [name] MRN: [id number] Date of Birth: Aug 02, 1931 Referring Provider (PT): Dr. Hulan Saas   Encounter Date: 03/20/2018  PT End of Session - 03/20/18 0836    Visit Number  2    Date for PT Re-Evaluation  05/07/18    Authorization Type  Medicare    PT Start Time  0805    PT Stop Time  0932    PT Time Calculation (min)  39 min    Activity Tolerance  Patient tolerated treatment well       Past Medical History:  Diagnosis Date  . Arthritis   . GERD (gastroesophageal reflux disease)   . H/O hiatal hernia    "FOR YEARS" " NO PROBLEM"  . Hyperlipidemia   . Hypertension   . Knee pain   . Neuropathy    PERIPHERAL   . Osteoporosis   . PMR (polymyalgia rheumatica) (HCC)   . Polymyalgia ([place]) 5 YEARS    Past Surgical History:  Procedure Laterality Date  . CARPAL TUNNEL RELEASE    . CATARACT EXTRACTION     right  . COLONOSCOPY  2007  . INGUINAL HERNIA REPAIR  10/14/2011   Procedure: HERNIA REPAIR INGUINAL ADULT;  Surgeon: [name], MD;  Location: [facility];  Service: General;  Laterality: Right;  Right Inguinal Hernia Repair with Mesh  . TONSILLECTOMY    . VARICOSE VEIN SURGERY      There were no vitals filed for this visit.  Subjective Assessment - 03/20/18 0806    Subjective  Had another PRP injection and immediately my knee stopped creaking.  I've been doing various exercises.  Using RW.  When I stand and put weight on my leg it stiffens up.      Currently in Pain?  Yes    Pain Score  4     Pain Location  Knee    Pain Orientation  Right    Pain Type  Chronic pain         [facility] PT Assessment - 03/20/18 0001      6 Minute Walk- Baseline   6 Minute Walk- Baseline  --   3 minute walk 245 feet with RW "I'm a little winded."                    [facility] Adult PT Treatment/Exercise - 03/20/18 0001      Knee/Hip Exercises: Stretches   Active Hamstring Stretch  Right;Left;5 reps    Active Hamstring Stretch Limitations  seated      Knee/Hip Exercises: Aerobic   Nustep  seat 13 arms 11 L2 10 min       Knee/Hip Exercises: Standing   Heel Raises  Both;15 reps    Hip Abduction  AROM;Right;Left;5 reps    Hip Extension  AROM;Right;Left;5 reps    Other Standing Knee Exercises  step taps 10x      Knee/Hip Exercises: Seated   Long Arc Quad  Strengthening;Right;Left;15 reps    Long Arc Quad Limitations  red band    Heel Slides  AROM;Right;Left    Heel Slides Limitations  on floor slider    Clamshell with [employer]  Blue   30x double and single   Hamstring Curl  Strengthening;Right;Left;15 reps    Hamstring Limitations  red band    Sit to [employer]  5 reps   2 foam mats on mat table               PT Short Term Goals - 03/12/18 1940      PT SHORT TERM GOAL #1   Title  be independent in initial HEP      Time  4    Period  Weeks    Status  New    Target Date  04/09/18      PT SHORT TERM GOAL #2   Title  The patient will have improved right knee ROM 3-120 degrees needed for greater ease getting up and down from the chair and in/out of the car    Time  4    Period  Weeks    Status  New      PT SHORT TERM GOAL #3   Title  The patient will have improved hip flexor muscle length to 5 degrees and HS length to 65 degrees needed for improved stride length with ambulation    Time  4    Period  Weeks    Status  New      PT SHORT TERM GOAL #4   Title  Timed up and Go to 16 sec indicating improved gait safety    Time  4    Period  Weeks    Status  New      PT SHORT TERM GOAL #5   Title  Ten meter walk test to 14 sec indicate improved gait speed needed for community ambulation     Time  4    Status  New        PT Long Term Goals - 03/12/18 1943      PT LONG TERM GOAL #1   Title  be  independent in advanced HEP for further improvements in strength, ROM and balance      Time  8    Period  Weeks    Status  New    Target Date  05/07/18      PT LONG TERM GOAL #2   Title  Right knee ROM improved to 2-125 degrees needed for greater mobility     Time  8    Period  Weeks    Status  New      PT LONG TERM GOAL #3   Title  Improved right knee extension strength to 3+/5 and knee flexion strength to 4/5 needed for greater ease rising    Time  8    Period  Weeks    Status  New      PT LONG TERM GOAL #4   Title  Improved knee pain with usual ADLs by 40%    Time  8    Period  Weeks    Status  New      PT LONG TERM GOAL #5   Title  The patient will be able to ambulate 500 feet in six minutes needed for shorter distance community ambulation for traveling    Time  [place] - 03/20/18 6314    Clinical Impression Statement  Patient has decreased knee pain overall but does report left buttock pain even in nonweight bearing.  He continues to have short shuffled stuffs and needs verbal cues to hold his head up during 3 min walk test.  He is able to do a few  standing exercises without exacerbation of knee pain but reports general fatigue.  In addition to verbal cues during ambulation, therapist also monitoring pain and for excessive fatigue.      Rehab Potential  Good    Clinical Impairments Affecting Rehab Potential  risk for falls    PT Frequency  2x / week    PT Duration  8 weeks    PT Treatment/Interventions  ADLs/Self Care Home Management;Cryotherapy;Electrical Stimulation;Ultrasound;Moist Heat;Iontophoresis 4mg /ml Dexamethasone;Gait training;Stair training;Therapeutic activities;Therapeutic exercise;Patient/family education;Neuromuscular re-education;Manual techniques;Taping;Dry needling    PT Next Visit Plan    Nu-step; heel slides with floor slider;  LAQ, seated clams, marching;  manual therapy for left hip as needed    PT Home  Exercise Plan  Access Code: [email or web address]        Patient will benefit from skilled therapeutic intervention in order to improve the following deficits and impairments:  Pain, Decreased range of motion, Decreased strength, Decreased activity tolerance, Impaired perceived functional ability, Difficulty walking, Decreased balance  Visit Diagnosis: Chronic pain of right knee  Stiffness of right knee, not elsewhere classified  Muscle weakness (generalized)     Problem List Patient Active Problem List   Diagnosis Date Noted  . Right wrist pain 02/05/2018  . Degenerative arthritis of right knee 10/23/2017  . Left leg cellulitis 08/12/2017  . Arthritis of right knee 05/20/2017  . Diverticulosis of colon with hemorrhage   . Acute blood loss anemia   . GIB (gastrointestinal bleeding) 03/12/2017  . Leg edema, right 02/20/2015  . BPH (benign prostatic hyperplasia) 12/09/2014  . Chronic radicular low back pain 08/06/2012  . COPD (chronic obstructive pulmonary disease) ([place]) 04/21/2012  . Syncope 03/29/2012  . Hyponatremia 03/29/2012  . Localized osteoarthrosis not specified whether primary or secondary, lower leg 06/20/2010  . VARICOSE VEINS LOWER EXTREMITIES W/INFLAMMATION 02/12/2010  . [place] LOCALIZED OSTEOARTHROSIS INVOLVING HAND 10/16/2009  . DUPUYTREN'S CONTRACTURE, RIGHT 07/04/2009  . DEGENERATIVE DISC DISEASE, LUMBOSACRAL SPINE W/RADICULOPATHY 06/19/2009  . CARCINOMA, BASAL CELL 02/07/2009  . ANEMIA OF CHRONIC DISEASE 02/07/2009  . BURSITIS, RIGHT SHOULDER 10/07/2008  . CATARACT ASSOCIATED WITH OTHER SYNDROMES 08/01/2008  . DRY SKIN 08/01/2008  . CERUMEN IMPACTION, BILATERAL 03/01/2008  . PMR (polymyalgia rheumatica) ([place]) 01/18/2008  . CRAMP IN LIMB 01/18/2008  . DEPRESSION, SITUATIONAL, ACUTE 12/28/2007  . CONSTIPATION, DRUG INDUCED 12/28/2007  . POLYNEUROPATHY OTHER DISEASES CLASSIFIED ELSW 09/24/2007  . WEIGHT LOSS, ABNORMAL 01/07/2007  . Hyperlipidemia 09/22/2006  .  Essential hypertension 09/22/2006  . GERD (gastroesophageal reflux disease) 09/22/2006  . Osteoarthritis 09/22/2006   Ruben Im, PT 03/20/18 12:14 PM Phone: [phone] Fax: [phone]  Alvera Singh 03/20/2018, 12:14 PM  [facility] [address] [place], [place], [address] Phone: [phone]   Fax:  [phone]  Name: Darin [name] MRN: [id number] Date of Birth: [birth year]

## 2018-03-24 ENCOUNTER — Ambulatory Visit: Payer: Medicare Other

## 2018-03-24 DIAGNOSIS — G8929 Other chronic pain: Secondary | ICD-10-CM | POA: Diagnosis not present

## 2018-03-24 DIAGNOSIS — M25561 Pain in right knee: Principal | ICD-10-CM

## 2018-03-24 DIAGNOSIS — M25661 Stiffness of right knee, not elsewhere classified: Secondary | ICD-10-CM

## 2018-03-24 DIAGNOSIS — M6281 Muscle weakness (generalized): Secondary | ICD-10-CM | POA: Diagnosis not present

## 2018-03-24 NOTE — Therapy (Signed)
[facility] [address] [place], [place], [address] Phone: [phone]   Fax:  [phone]  Physical Therapy Treatment  Patient Details  Name: Darin [name] MRN: [id number] Date of Birth: 06/24/1931 Referring Provider (PT): Dr. Hulan Saas   Encounter Date: 03/24/2018  PT End of Session - 03/24/18 1011    Visit Number  3    Date for PT Re-Evaluation  05/07/18    Authorization Type  Medicare    PT Start Time  0925    PT Stop Time  1009    PT Time Calculation (min)  44 min    Activity Tolerance  Patient tolerated treatment well    Behavior During Therapy  [facility] for tasks assessed/performed       Past Medical History:  Diagnosis Date  . Arthritis   . GERD (gastroesophageal reflux disease)   . H/O hiatal hernia    "FOR YEARS" " NO PROBLEM"  . Hyperlipidemia   . Hypertension   . Knee pain   . Neuropathy    PERIPHERAL   . Osteoporosis   . PMR (polymyalgia rheumatica) (HCC)   . Polymyalgia ([place]) 5 YEARS    Past Surgical History:  Procedure Laterality Date  . CARPAL TUNNEL RELEASE    . CATARACT EXTRACTION     right  . COLONOSCOPY  2007  . INGUINAL HERNIA REPAIR  10/14/2011   Procedure: HERNIA REPAIR INGUINAL ADULT;  Surgeon: [name], MD;  Location: [facility];  Service: General;  Laterality: Right;  Right Inguinal Hernia Repair with Mesh  . TONSILLECTOMY    . VARICOSE VEIN SURGERY      There were no vitals filed for this visit.  Subjective Assessment - 03/24/18 0935    Currently in Pain?  Yes    Pain Score  1     Pain Location  Knee    Pain Orientation  Right    Pain Type  Chronic pain    Pain Onset  More than a month ago    Pain Frequency  Constant    Aggravating Factors   constant, standing    Pain Relieving Factors  non weight bearing                       [facility] Adult PT Treatment/Exercise - 03/24/18 0001      Ambulation/Gait   Assistive device  Rolling walker    Gait Pattern  Shuffle    Gait Comments  decreased stride lengths bilaterally.  Ambulation on level surface x 5 minutes with rolling walker.  PT provided verbal cues for increased step length.        Knee/Hip Exercises: Stretches   Active Hamstring Stretch  Right;Left;20 seconds;3 reps    Active Hamstring Stretch Limitations  seated      Knee/Hip Exercises: Aerobic   Nustep  seat 13 arms 11 L2 10 min    PT present to discuss progress     Knee/Hip Exercises: Standing   Heel Raises  Both;20 reps   25 reps   Hip Abduction  Stengthening;Right;Left;Both;2 sets;10 reps    Hip Extension  Stengthening;Both;2 sets;10 reps    Other Standing Knee Exercises  step taps 10x      Knee/Hip Exercises: Seated   Long Arc Quad  Strengthening;Right;Left;20 reps    Clamshell with TheraBand  Blue   30x double and single   Hamstring Curl  Strengthening;Right;Left;20 reps    Hamstring Limitations  red band  Sit to Sand  5 reps   2 foam mats on chair               PT Short Term Goals - 03/12/18 1940      PT SHORT TERM GOAL #1   Title  be independent in initial HEP      Time  4    Period  Weeks    Status  New    Target Date  04/09/18      PT SHORT TERM GOAL #2   Title  The patient will have improved right knee ROM 3-120 degrees needed for greater ease getting up and down from the chair and in/out of the car    Time  4    Period  Weeks    Status  New      PT SHORT TERM GOAL #3   Title  The patient will have improved hip flexor muscle length to 5 degrees and HS length to 65 degrees needed for improved stride length with ambulation    Time  4    Period  Weeks    Status  New      PT SHORT TERM GOAL #4   Title  Timed up and Go to 16 sec indicating improved gait safety    Time  4    Period  Weeks    Status  New      PT SHORT TERM GOAL #5   Title  Ten meter walk test to 14 sec indicate improved gait speed needed for community ambulation     Time  4    Status  New        PT Long  Term Goals - 03/12/18 1943      PT LONG TERM GOAL #1   Title  be independent in advanced HEP for further improvements in strength, ROM and balance      Time  8    Period  Weeks    Status  New    Target Date  05/07/18      PT LONG TERM GOAL #2   Title  Right knee ROM improved to 2-125 degrees needed for greater mobility     Time  8    Period  Weeks    Status  New      PT LONG TERM GOAL #3   Title  Improved right knee extension strength to 3+/5 and knee flexion strength to 4/5 needed for greater ease rising    Time  8    Period  Weeks    Status  New      PT LONG TERM GOAL #4   Title  Improved knee pain with usual ADLs by 40%    Time  8    Period  Weeks    Status  New      PT LONG TERM GOAL #5   Title  The patient will be able to ambulate 500 feet in six minutes needed for shorter distance community ambulation for traveling    Time  8    Period  Weeks    Status  New            Plan - 03/24/18 0945    Clinical Impression Statement  Pt continues to ambulate with short step length and PT provided cueing throughout session to increase step length.  Pt requires verbal cues with exercise to slow down.  Pt with chronic balance and strength deficits and has resumed HEP for to address.  Pt will continue to benefit from skilled PT for strength, balance and gait to improve safety at home and in the community.      Rehab Potential  Good    Clinical Impairments Affecting Rehab Potential  risk for falls    PT Frequency  2x / week    PT Duration  8 weeks    PT Treatment/Interventions  ADLs/Self Care Home Management;Cryotherapy;Electrical Stimulation;Ultrasound;Moist Heat;Iontophoresis 4mg /ml Dexamethasone;Gait training;Stair training;Therapeutic activities;Therapeutic exercise;Patient/family education;Neuromuscular re-education;Manual techniques;Taping;Dry needling    PT Next Visit Plan    Nu-step; heel slides with floor slider;  LAQ, seated clams, marching;  manual therapy for left hip  as needed    PT Home Exercise Plan  Access Code: [email or web address]     Recommended Other Services  initial cert is signed    Consulted and Agree with Plan of Care  Patient       Patient will benefit from skilled therapeutic intervention in order to improve the following deficits and impairments:  Pain, Decreased range of motion, Decreased strength, Decreased activity tolerance, Impaired perceived functional ability, Difficulty walking, Decreased balance  Visit Diagnosis: Chronic pain of right knee  Stiffness of right knee, not elsewhere classified  Muscle weakness (generalized)     Problem List Patient Active Problem List   Diagnosis Date Noted  . Right wrist pain 02/05/2018  . Degenerative arthritis of right knee 10/23/2017  . Left leg cellulitis 08/12/2017  . Arthritis of right knee 05/20/2017  . Diverticulosis of colon with hemorrhage   . Acute blood loss anemia   . GIB (gastrointestinal bleeding) 03/12/2017  . Leg edema, right 02/20/2015  . BPH (benign prostatic hyperplasia) 12/09/2014  . Chronic radicular low back pain 08/06/2012  . COPD (chronic obstructive pulmonary disease) ([place]) 04/21/2012  . Syncope 03/29/2012  . Hyponatremia 03/29/2012  . Localized osteoarthrosis not specified whether primary or secondary, lower leg 06/20/2010  . VARICOSE VEINS LOWER EXTREMITIES W/INFLAMMATION 02/12/2010  . [place] LOCALIZED OSTEOARTHROSIS INVOLVING HAND 10/16/2009  . DUPUYTREN'S CONTRACTURE, RIGHT 07/04/2009  . DEGENERATIVE DISC DISEASE, LUMBOSACRAL SPINE W/RADICULOPATHY 06/19/2009  . CARCINOMA, BASAL CELL 02/07/2009  . ANEMIA OF CHRONIC DISEASE 02/07/2009  . BURSITIS, RIGHT SHOULDER 10/07/2008  . CATARACT ASSOCIATED WITH OTHER SYNDROMES 08/01/2008  . DRY SKIN 08/01/2008  . CERUMEN IMPACTION, BILATERAL 03/01/2008  . PMR (polymyalgia rheumatica) ([place]) 01/18/2008  . CRAMP IN LIMB 01/18/2008  . DEPRESSION, SITUATIONAL, ACUTE 12/28/2007  . CONSTIPATION, DRUG INDUCED 12/28/2007  .  POLYNEUROPATHY OTHER DISEASES CLASSIFIED ELSW 09/24/2007  . WEIGHT LOSS, ABNORMAL 01/07/2007  . Hyperlipidemia 09/22/2006  . Essential hypertension 09/22/2006  . GERD (gastroesophageal reflux disease) 09/22/2006  . Osteoarthritis 09/22/2006    Sigurd Sos, PT 03/24/18 10:13 AM  [facility] [address] [place], [place], [address] Phone: [phone]   Fax:  [phone]  Name: Trentyn Boisclair MRN: [id number] Date of Birth: 1931-10-10

## 2018-03-26 ENCOUNTER — Encounter: Payer: Self-pay | Admitting: Physical Therapy

## 2018-03-26 ENCOUNTER — Ambulatory Visit: Payer: Medicare Other | Attending: Family Medicine | Admitting: Physical Therapy

## 2018-03-26 DIAGNOSIS — M25561 Pain in right knee: Secondary | ICD-10-CM | POA: Diagnosis not present

## 2018-03-26 DIAGNOSIS — G8929 Other chronic pain: Secondary | ICD-10-CM | POA: Diagnosis not present

## 2018-03-26 DIAGNOSIS — M6281 Muscle weakness (generalized): Secondary | ICD-10-CM | POA: Diagnosis not present

## 2018-03-26 DIAGNOSIS — R279 Unspecified lack of coordination: Secondary | ICD-10-CM | POA: Insufficient documentation

## 2018-03-26 DIAGNOSIS — M25661 Stiffness of right knee, not elsewhere classified: Secondary | ICD-10-CM | POA: Diagnosis not present

## 2018-03-26 NOTE — Therapy (Signed)
[facility] [address] [place], [place], [address] Phone: [phone]   Fax:  [phone]  Physical Therapy Treatment  Patient Details  Name: Darin [name] MRN: [id number] Date of Birth: [birth year] Referring Provider (PT): Dr. Hulan Saas   Encounter Date: 03/26/2018  PT End of Session - 03/26/18 0754    Visit Number  4    Date for PT Re-Evaluation  05/07/18    Authorization Type  Medicare    PT Start Time  0729    PT Stop Time  0800   30 min treatment slot   PT Time Calculation (min)  31 min    Activity Tolerance  Patient tolerated treatment well       Past Medical History:  Diagnosis Date  . Arthritis   . GERD (gastroesophageal reflux disease)   . H/O hiatal hernia    "FOR YEARS" " NO PROBLEM"  . Hyperlipidemia   . Hypertension   . Knee pain   . Neuropathy    PERIPHERAL   . Osteoporosis   . PMR (polymyalgia rheumatica) (HCC)   . Polymyalgia ([place]) 5 YEARS    Past Surgical History:  Procedure Laterality Date  . CARPAL TUNNEL RELEASE    . CATARACT EXTRACTION     right  . COLONOSCOPY  2007  . INGUINAL HERNIA REPAIR  10/14/2011   Procedure: HERNIA REPAIR INGUINAL ADULT;  Surgeon: [name], MD;  Location: [place];  Service: General;  Laterality: Right;  Right Inguinal Hernia Repair with Mesh  . TONSILLECTOMY    . VARICOSE VEIN SURGERY      There were no vitals filed for this visit.  Subjective Assessment - 03/26/18 0730    Subjective  My knee is misbehaving this morning b/c of the weather.  I could use some limbering up.  My knee tends to crack when I put weight on it.      Currently in Pain?  Yes    Pain Score  6     Pain Location  Knee    Pain Orientation  Right    Pain Type  Chronic pain                       [facility] Adult PT Treatment/Exercise - 03/26/18 0001      Neuro Re-ed    Neuro Re-ed Details   floor ladder walk to increase step length      Knee/Hip  Exercises: Stretches   Active Hamstring Stretch  Right;Left;3 reps;5 reps    Active Hamstring Stretch Limitations  on step     Other Knee/Hip Stretches  step back for hip flexor stretch       Knee/Hip Exercises: Aerobic   Nustep  seat 13 arms 11 L2 10 min    PT present to discuss progress     Knee/Hip Exercises: Standing   Hip Abduction  Stengthening;Right;Left;Both;2 sets;10 reps    Other Standing Knee Exercises  step taps 20x    Other Standing Knee Exercises  hip circles 5x clockwise/counter clock right/left      Knee/Hip Exercises: Seated   Long Arc Quad  Strengthening;Right;Left;20 reps    Long Arc Quad Weight  2 lbs.    Clamshell with TheraBand  Blue   30x double and single   Hamstring Curl  Strengthening;Right;Left;20 reps    Hamstring Limitations  green band    Sit to Sand  5 reps   2 foam mats on chair  PT Short Term Goals - 03/12/18 1940      PT SHORT TERM GOAL #1   Title  be independent in initial HEP      Time  4    Period  Weeks    Status  New    Target Date  04/09/18      PT SHORT TERM GOAL #2   Title  The patient will have improved right knee ROM 3-120 degrees needed for greater ease getting up and down from the chair and in/out of the car    Time  4    Period  Weeks    Status  New      PT SHORT TERM GOAL #3   Title  The patient will have improved hip flexor muscle length to 5 degrees and HS length to 65 degrees needed for improved stride length with ambulation    Time  4    Period  Weeks    Status  New      PT SHORT TERM GOAL #4   Title  Timed up and Go to 16 sec indicating improved gait safety    Time  4    Period  Weeks    Status  New      PT SHORT TERM GOAL #5   Title  Ten meter walk test to 14 sec indicate improved gait speed needed for community ambulation     Time  4    Status  New        PT Long Term Goals - 03/12/18 1943      PT LONG TERM GOAL #1   Title  be independent in advanced HEP for further improvements  in strength, ROM and balance      Time  8    Period  Weeks    Status  New    Target Date  05/07/18      PT LONG TERM GOAL #2   Title  Right knee ROM improved to 2-125 degrees needed for greater mobility     Time  8    Period  Weeks    Status  New      PT LONG TERM GOAL #3   Title  Improved right knee extension strength to 3+/5 and knee flexion strength to 4/5 needed for greater ease rising    Time  8    Period  Weeks    Status  New      PT LONG TERM GOAL #4   Title  Improved knee pain with usual ADLs by 40%    Time  8    Period  Weeks    Status  New      PT LONG TERM GOAL #5   Title  The patient will be able to ambulate 500 feet in six minutes needed for shorter distance community ambulation for traveling    Time  [place] - 03/26/18 0755    Clinical Impression Statement  The patient is able to participate in increased exercise intensity including standing time.  Improved step length with floor ladder walking.  Decreased complaints of hip pain today.  Therapist closely monitoring response with all interventions.      Rehab Potential  Good    Clinical Impairments Affecting Rehab Potential  risk for falls    PT Frequency  2x / week    PT  Treatment/Interventions  ADLs/Self Care Home Management;Cryotherapy;Electrical Stimulation;Ultrasound;Moist Heat;Iontophoresis 4mg /ml Dexamethasone;Gait training;Stair training;Therapeutic activities;Therapeutic exercise;Patient/family education;Neuromuscular re-education;Manual techniques;Taping;Dry needling    PT Next Visit Plan    Nu-step; heel slides with floor slider;  LAQ, seated clams, marching;  manual therapy for left hip as needed    PT Home Exercise Plan  Access Code: [email or web address]        Patient will benefit from skilled therapeutic intervention in order to improve the following deficits and impairments:  Pain, Decreased range of motion, Decreased strength, Decreased activity tolerance,  Impaired perceived functional ability, Difficulty walking, Decreased balance  Visit Diagnosis: Chronic pain of right knee  Stiffness of right knee, not elsewhere classified  Muscle weakness (generalized)     Problem List Patient Active Problem List   Diagnosis Date Noted  . Right wrist pain 02/05/2018  . Degenerative arthritis of right knee 10/23/2017  . Left leg cellulitis 08/12/2017  . Arthritis of right knee 05/20/2017  . Diverticulosis of colon with hemorrhage   . Acute blood loss anemia   . GIB (gastrointestinal bleeding) 03/12/2017  . Leg edema, right 02/20/2015  . BPH (benign prostatic hyperplasia) 12/09/2014  . Chronic radicular low back pain 08/06/2012  . COPD (chronic obstructive pulmonary disease) ([place]) 04/21/2012  . Syncope 03/29/2012  . Hyponatremia 03/29/2012  . Localized osteoarthrosis not specified whether primary or secondary, lower leg 06/20/2010  . VARICOSE VEINS LOWER EXTREMITIES W/INFLAMMATION 02/12/2010  . [place] LOCALIZED OSTEOARTHROSIS INVOLVING HAND 10/16/2009  . DUPUYTREN'S CONTRACTURE, RIGHT 07/04/2009  . DEGENERATIVE DISC DISEASE, LUMBOSACRAL SPINE W/RADICULOPATHY 06/19/2009  . CARCINOMA, BASAL CELL 02/07/2009  . ANEMIA OF CHRONIC DISEASE 02/07/2009  . BURSITIS, RIGHT SHOULDER 10/07/2008  . CATARACT ASSOCIATED WITH OTHER SYNDROMES 08/01/2008  . DRY SKIN 08/01/2008  . CERUMEN IMPACTION, BILATERAL 03/01/2008  . PMR (polymyalgia rheumatica) ([place]) 01/18/2008  . CRAMP IN LIMB 01/18/2008  . DEPRESSION, SITUATIONAL, ACUTE 12/28/2007  . CONSTIPATION, DRUG INDUCED 12/28/2007  . POLYNEUROPATHY OTHER DISEASES CLASSIFIED ELSW 09/24/2007  . WEIGHT LOSS, ABNORMAL 01/07/2007  . Hyperlipidemia 09/22/2006  . Essential hypertension 09/22/2006  . GERD (gastroesophageal reflux disease) 09/22/2006  . Osteoarthritis 09/22/2006   Ruben Im, PT 03/26/18 10:00 PM Phone: [phone] Fax: [phone]  Alvera Singh 03/26/2018, 10:00 PM  [facility] [address] [place], [place], [address] Phone: [phone]   Fax:  [phone]  Name: Darin [name] MRN: [id number] Date of Birth: [birth year]

## 2018-03-27 ENCOUNTER — Ambulatory Visit (INDEPENDENT_AMBULATORY_CARE_PROVIDER_SITE_OTHER): Payer: Medicare Other | Admitting: Podiatry

## 2018-03-27 ENCOUNTER — Encounter: Payer: Self-pay | Admitting: Podiatry

## 2018-03-27 DIAGNOSIS — M79675 Pain in left toe(s): Secondary | ICD-10-CM | POA: Diagnosis not present

## 2018-03-27 DIAGNOSIS — M79674 Pain in right toe(s): Secondary | ICD-10-CM

## 2018-03-27 DIAGNOSIS — M7672 Peroneal tendinitis, left leg: Secondary | ICD-10-CM

## 2018-03-27 DIAGNOSIS — B351 Tinea unguium: Secondary | ICD-10-CM | POA: Diagnosis not present

## 2018-03-27 NOTE — Progress Notes (Signed)
Complaint:  Visit Type: Patient returns to my office for continued preventative foot care services. Complaint: Patient states" my nails have grown long and thick and become painful to walk and wear shoes" Patient has been diagnosed with neuropathy.. The patient presents for preventative foot care services. No changes to ROSPatient says he has pain under his outside ankle bone left foot which has improved..  Pain in upper leg left leg.  Podiatric Exam: Vascular: dorsalis pedis and posterior tibial pulses are palpable bilateral. Capillary return is immediate. Temperature gradient is WNL. Skin turgor WNL  Sensorium: Normal Semmes Weinstein monofilament test. Normal tactile sensation bilaterally. Nail Exam: Pt has thick disfigured discolored nails with subungual debris noted bilateral entire nail hallux through fifth toenails Ulcer Exam: There is no evidence of ulcer or pre-ulcerative changes or infection. Orthopedic Exam: Muscle tone and strength are WNL. No limitations in general ROM. No crepitus or effusions noted. Foot type and digits show no abnormalities. Bony prominences are unremarkable. Skin: No Porokeratosis. No infection or ulcers  Diagnosis:  Onychomycosis, , Pain in right toe, pain in left toes.  Peroneal tendinitis left foot.  Treatment & Plan Procedures and Treatment: Consent by patient was obtained for treatment procedures. The patient understood the discussion of treatment and procedures well. All questions were answered thoroughly reviewed. Debridement of mycotic and hypertrophic toenails, 1 through 5 bilateral and clearing of subungual debris. No ulceration, no infection noted. Patient received powerstep insoles to help with peroneal tendinitis. Return Visit-Office Procedure: Patient instructed to return to the office for a follow up visit 3 months for continued evaluation and treatment.    Gardiner Barefoot DPM

## 2018-03-30 ENCOUNTER — Encounter: Payer: Self-pay | Admitting: Internal Medicine

## 2018-03-30 ENCOUNTER — Ambulatory Visit (INDEPENDENT_AMBULATORY_CARE_PROVIDER_SITE_OTHER): Payer: Medicare Other | Admitting: Internal Medicine

## 2018-03-30 VITALS — BP 138/76 | HR 106 | Temp 98.0°F | Wt 198.4 lb

## 2018-03-30 DIAGNOSIS — I8393 Asymptomatic varicose veins of bilateral lower extremities: Secondary | ICD-10-CM | POA: Diagnosis not present

## 2018-03-30 DIAGNOSIS — R54 Age-related physical debility: Secondary | ICD-10-CM

## 2018-03-30 DIAGNOSIS — L309 Dermatitis, unspecified: Secondary | ICD-10-CM | POA: Diagnosis not present

## 2018-03-30 DIAGNOSIS — L298 Other pruritus: Secondary | ICD-10-CM | POA: Diagnosis not present

## 2018-03-30 MED ORDER — TRIAMCINOLONE ACETONIDE 0.1 % EX OINT: 1.0000 "application " | TOPICAL_OINTMENT | Freq: Two times a day (BID) | CUTANEOUS | 1 refills | Status: DC

## 2018-03-30 NOTE — Patient Instructions (Signed)
This is reminiscent of statis dermatitis but I agree somewhat atypical based on  Your history.   Add topical cortisone to the itchy lower areas  Plan fu viist with dr B in 7-10 days   Contact us if worsening streaking  Pain and signs of infection.

## 2018-03-30 NOTE — Progress Notes (Signed)
Chief Complaint  Patient presents with  . Rash    on left leg from ankle to knee. X1 week and is very itchy. Tried nevan on it but no improvement. Using neutrogena soap.     HPI: Darin [name] 85 y.o. come in for acute urgent visit    Cannot wait  Problem   PCP  appt NA   Seen 1/ 3 by podiatry for toenail  And at that time some swelling post ankle felt to be tendinitis cause of locationj  Since then very itchy rash on lle withtou trauma or reason . Has some swelling  Has vv and hx surgical  noulcers   Used nivea    No streaking fever  Took 3 asa per day incase clotted phelbitis  In pt for r knee predicament .   ROS: See pertinent positives and negatives per HPI. No cp sob falling   Past Medical History:  Diagnosis Date  . Arthritis   . GERD (gastroesophageal reflux disease)   . H/O hiatal hernia    "FOR YEARS" " NO PROBLEM"  . Hyperlipidemia   . Hypertension   . Knee pain   . Neuropathy    PERIPHERAL   . Osteoporosis   . PMR (polymyalgia rheumatica) (HCC)   . Polymyalgia ([place]) 5 YEARS    Family History  Problem Relation Age of Onset  . Heart disease Father   . Coronary artery disease Unknown     Social History   Socioeconomic History  . Marital status: Widowed    Spouse name: Not on file  . Number of children: Not on file  . Years of education: Not on file  . Highest education level: Not on file  Occupational History  . Occupation: retired    Fish farm manager: RETIRED  Social Needs  . Financial resource strain: Not on file  . Food insecurity:    Worry: Not on file    Inability: Not on file  . Transportation needs:    Medical: Not on file    Non-medical: Not on file  Tobacco Use  . Smoking status: Never Smoker  . Smokeless tobacco: Never Used  Substance and Sexual Activity  . Alcohol use: Yes    Alcohol/week: 3.0 standard drinks    Types: 3 Glasses of wine per week    Comment: half glass wine before dinner  . Drug use: No  . Sexual activity: Yes  Lifestyle   . Physical activity:    Days per week: Not on file    Minutes per session: Not on file  . Stress: Not on file  Relationships  . Social connections:    Talks on phone: Not on file    Gets together: Not on file    Attends religious service: Not on file    Active member of club or organization: Not on file    Attends meetings of clubs or organizations: Not on file    Relationship status: Not on file  Other Topics Concern  . Not on file  Social History Narrative  . Not on file    Outpatient Medications Prior to Visit  Medication Sig Dispense Refill  . amLODipine (NORVASC) 5 MG tablet TAKE 1 TABLET DAILY 90 tablet 0  . aspirin 81 MG tablet Take 81 mg by mouth daily.     . benazepril-hydrochlorthiazide (LOTENSIN HCT) 10-12.5 MG tablet TAKE 1 TABLET DAILY        *PATIENT NEEDS AN  APPOINTMENT* 90 tablet 0  . doxycycline (VIBRAMYCIN) 100 MG capsule     . famotidine (PEPCID) 20 MG tablet TAKE 1 TABLET AT BEDTIME 90 tablet 0  . finasteride (PROSCAR) 5 MG tablet TAKE 1 TABLET EACH DAY. 90 tablet 0  . gabapentin (NEURONTIN) 100 MG capsule TAKE 1 CAPSULE 4 TIMES     DAILY 360 capsule 0  . niacin 500 MG tablet Take 500 mg by mouth 2 (two) times daily with a meal.     . polyethylene glycol (MIRALAX / GLYCOLAX) packet Take 17 g by mouth daily as needed for moderate constipation.    . vitamin C (ASCORBIC ACID) 500 MG tablet Take 500 mg by mouth daily.     No facility-administered medications prior to visit.      EXAM:  BP 138/76 (BP Location: Right Arm, Patient Position: Sitting, Cuff Size: Large)   Pulse (!) 106   Temp 98 F (36.7 C) (Oral)   Wt 198 lb 6.4 oz (90 kg)   BMI 26.18 kg/m   Body mass index is 26.18 kg/m.  GENERAL: vitals reviewed and listed above, alert, oriented, appears well hydrated and in no acute distress HEENT: atraumatic, conjunctiva  clear, no obvious abnormalities on inspection of external nose and ears  LUNGS: clear to auscultation bilaterally, no  wheezes, rales or rhonchi,CV: HRRR, no clubbing cyanosis ornl cap refill  MS: moves all extremities without noticeable focal  Abnormality  Left le with chronic skin changes  Vv but no ulcers and patches of  Red speckled area  Left latera prox has some central nodular feeling but no tenderness or fluctuance  No streaking  Retrocalcaneal are is puffy  Pink non tender ?  N vv noted  Ankle 2+ edema compared to right     PSYCH: pleasant and cooperative, no obvious depression or anxiety  BP Readings from Last 3 Encounters:  03/30/18 138/76  03/12/18 120/70  03/04/18 140/68    ASSESSMENT AND PLAN:  Discussed the following assessment and plan:  Dermatitis  Pruritic erythematous rash  Varicose veins of both lower extremities, unspecified whether complicated  Advanced age We discussed  Doing doppler for dvt but  Not likely    The sever itching more cw skin statis dermatitis but still atypical   And why now he uses compression  .   For now used topical steroid and  Moisturizing  And close fu advised and fu if  Infection type sx   Make fu pcp in 7- 10 days  -Patient advised to return or notify health care team  if  new concerns arise.  Patient Instructions  This is reminiscent of statis dermatitis but I agree somewhat atypical based on  Your history.   Add topical cortisone to the itchy lower areas  Plan fu viist with dr B in 7-10 days   Contact us if worsening streaking  Pain and signs of infection.    Standley Brooking. Panosh M.D.

## 2018-03-31 ENCOUNTER — Encounter: Payer: Self-pay | Admitting: Physical Therapy

## 2018-03-31 ENCOUNTER — Ambulatory Visit: Payer: Medicare Other | Admitting: Physical Therapy

## 2018-03-31 DIAGNOSIS — G8929 Other chronic pain: Secondary | ICD-10-CM | POA: Diagnosis not present

## 2018-03-31 DIAGNOSIS — M25661 Stiffness of right knee, not elsewhere classified: Secondary | ICD-10-CM | POA: Diagnosis not present

## 2018-03-31 DIAGNOSIS — M25561 Pain in right knee: Secondary | ICD-10-CM | POA: Diagnosis not present

## 2018-03-31 DIAGNOSIS — M6281 Muscle weakness (generalized): Secondary | ICD-10-CM

## 2018-03-31 DIAGNOSIS — R279 Unspecified lack of coordination: Secondary | ICD-10-CM | POA: Diagnosis not present

## 2018-03-31 NOTE — Therapy (Signed)
[facility] [address], [place], [place], [address] Phone: [phone]   Fax:  [phone]  Physical Therapy Treatment  Patient Details  Name: Darin [name] MRN: [id number] Date of Birth: 1931-08-23 Referring Provider (PT): Dr. Hulan Saas   Encounter Date: 03/31/2018  PT End of Session - 03/31/18 1215    Visit Number  5    Date for PT Re-Evaluation  05/07/18    Authorization Type  Medicare    PT Start Time  7124    PT Stop Time  0930    PT Time Calculation (min)  38 min    Activity Tolerance  Patient tolerated treatment well;Patient limited by pain       Past Medical History:  Diagnosis Date  . Arthritis   . GERD (gastroesophageal reflux disease)   . H/O hiatal hernia    "FOR YEARS" " NO PROBLEM"  . Hyperlipidemia   . Hypertension   . Knee pain   . Neuropathy    PERIPHERAL   . Osteoporosis   . PMR (polymyalgia rheumatica) (HCC)   . Polymyalgia ([place]) 5 YEARS    Past Surgical History:  Procedure Laterality Date  . CARPAL TUNNEL RELEASE    . CATARACT EXTRACTION     right  . COLONOSCOPY  2007  . INGUINAL HERNIA REPAIR  10/14/2011   Procedure: HERNIA REPAIR INGUINAL ADULT;  Surgeon: [name], MD;  Location: [facility];  Service: General;  Laterality: Right;  Right Inguinal Hernia Repair with Mesh  . TONSILLECTOMY    . VARICOSE VEIN SURGERY      There were no vitals filed for this visit.  Subjective Assessment - 03/31/18 0854    Subjective  My knee has been hurting all weekend.  I got the intinerary in the mail yesterday for the [place] trip.      Currently in Pain?  Yes    Pain Score  7     Pain Location  Knee    Pain Orientation  Right    Pain Type  Chronic pain    Aggravating Factors   walking, standing                       [facility] Adult PT Treatment/Exercise - 03/31/18 0001      Neuro Re-ed    Neuro Re-ed Details   visualization of floor ladder walk to increase step  length;  standing on black foam with head turns, UE reaches      Knee/Hip Exercises: Stretches   Active Hamstring Stretch  Right;Left;20 seconds;3 reps    Active Hamstring Stretch Limitations  seated      Knee/Hip Exercises: Aerobic   Nustep  seat 13 arms 11 L2 10 min    PT present to discuss progress     Knee/Hip Exercises: Standing   Heel Raises  Both;15 reps    Hip Extension  Stengthening;Both;2 sets;10 reps    Other Standing Knee Exercises  edge of BOSU taps 15x    Other Standing Knee Exercises  lateral step to touch BOSU 10x each       Knee/Hip Exercises: Seated   Long Arc Quad  Strengthening;Right;Left;20 reps    Long Arc Quad Weight  2 lbs.    [facility] doctor with green band 20x    Clamshell with TheraBand  Blue   30x double and single   Knee/Hip Flexion  2# 20x    Hamstring Curl  Strengthening;Right;Left;20 reps    Hamstring Limitations  green band               PT Short Term Goals - 03/12/18 1940      PT SHORT TERM GOAL #1   Title  be independent in initial HEP      Time  4    Period  Weeks    Status  New    Target Date  04/09/18      PT SHORT TERM GOAL #2   Title  The patient will have improved right knee ROM 3-120 degrees needed for greater ease getting up and down from the chair and in/out of the car    Time  4    Period  Weeks    Status  New      PT SHORT TERM GOAL #3   Title  The patient will have improved hip flexor muscle length to 5 degrees and HS length to 65 degrees needed for improved stride length with ambulation    Time  4    Period  Weeks    Status  New      PT SHORT TERM GOAL #4   Title  Timed up and Go to 16 sec indicating improved gait safety    Time  4    Period  Weeks    Status  New      PT SHORT TERM GOAL #5   Title  Ten meter walk test to 14 sec indicate improved gait speed needed for community ambulation     Time  4    Status  New        PT Long Term Goals - 03/12/18 1943      PT LONG TERM  GOAL #1   Title  be independent in advanced HEP for further improvements in strength, ROM and balance      Time  8    Period  Weeks    Status  New    Target Date  05/07/18      PT LONG TERM GOAL #2   Title  Right knee ROM improved to 2-125 degrees needed for greater mobility     Time  8    Period  Weeks    Status  New      PT LONG TERM GOAL #3   Title  Improved right knee extension strength to 3+/5 and knee flexion strength to 4/5 needed for greater ease rising    Time  8    Period  Weeks    Status  New      PT LONG TERM GOAL #4   Title  Improved knee pain with usual ADLs by 40%    Time  8    Period  Weeks    Status  New      PT LONG TERM GOAL #5   Title  The patient will be able to ambulate 500 feet in six minutes needed for shorter distance community ambulation for traveling    Time  8    Period  Weeks    Status  New            Plan - 03/31/18 1216    Clinical Impression Statement  The patient reports increased knee pain today but continues to be motivated to continue exercising with modifications of intermittent seated exercises between bouts of standing exercises.  He complains of lateral/inferior knee pain with transitioning from standing to sitting especially today so avoided repetitive sit to  stand ex.  He is able to visualize the floor ladder and maintain much improved stride length instead of short shuffled steps for several laps.  Therapist providing close supervision in standing for safety.  He needs contact guard assist when standing on foam for balance challenges.      Rehab Potential  Good    Clinical Impairments Affecting Rehab Potential  risk for falls    PT Frequency  2x / week    PT Duration  8 weeks    PT Treatment/Interventions  ADLs/Self Care Home Management;Cryotherapy;Electrical Stimulation;Ultrasound;Moist Heat;Iontophoresis 4mg /ml Dexamethasone;Gait training;Stair training;Therapeutic activities;Therapeutic exercise;Patient/family  education;Neuromuscular re-education;Manual techniques;Taping;Dry needling    PT Next Visit Plan    Nu-step; heel slides with floor slider;  LAQ, seated clams, marching;  manual therapy for left hip as needed    PT Home Exercise Plan  Access Code: [email or web address]        Patient will benefit from skilled therapeutic intervention in order to improve the following deficits and impairments:  Pain, Decreased range of motion, Decreased strength, Decreased activity tolerance, Impaired perceived functional ability, Difficulty walking, Decreased balance  Visit Diagnosis: Chronic pain of right knee  Stiffness of right knee, not elsewhere classified  Muscle weakness (generalized)     Problem List Patient Active Problem List   Diagnosis Date Noted  . Right wrist pain 02/05/2018  . Degenerative arthritis of right knee 10/23/2017  . Left leg cellulitis 08/12/2017  . Arthritis of right knee 05/20/2017  . Diverticulosis of colon with hemorrhage   . Acute blood loss anemia   . GIB (gastrointestinal bleeding) 03/12/2017  . Leg edema, right 02/20/2015  . BPH (benign prostatic hyperplasia) 12/09/2014  . Chronic radicular low back pain 08/06/2012  . COPD (chronic obstructive pulmonary disease) ([place]) 04/21/2012  . Syncope 03/29/2012  . Hyponatremia 03/29/2012  . Localized osteoarthrosis not specified whether primary or secondary, lower leg 06/20/2010  . VARICOSE VEINS LOWER EXTREMITIES W/INFLAMMATION 02/12/2010  . [place] LOCALIZED OSTEOARTHROSIS INVOLVING HAND 10/16/2009  . DUPUYTREN'S CONTRACTURE, RIGHT 07/04/2009  . DEGENERATIVE DISC DISEASE, LUMBOSACRAL SPINE W/RADICULOPATHY 06/19/2009  . CARCINOMA, BASAL CELL 02/07/2009  . ANEMIA OF CHRONIC DISEASE 02/07/2009  . BURSITIS, RIGHT SHOULDER 10/07/2008  . CATARACT ASSOCIATED WITH OTHER SYNDROMES 08/01/2008  . DRY SKIN 08/01/2008  . CERUMEN IMPACTION, BILATERAL 03/01/2008  . PMR (polymyalgia rheumatica) ([place]) 01/18/2008  . CRAMP IN LIMB 01/18/2008  .  DEPRESSION, SITUATIONAL, ACUTE 12/28/2007  . CONSTIPATION, DRUG INDUCED 12/28/2007  . POLYNEUROPATHY OTHER DISEASES CLASSIFIED ELSW 09/24/2007  . WEIGHT LOSS, ABNORMAL 01/07/2007  . Hyperlipidemia 09/22/2006  . Essential hypertension 09/22/2006  . GERD (gastroesophageal reflux disease) 09/22/2006  . Osteoarthritis 09/22/2006   Ruben Im, PT 03/31/18 12:30 PM Phone: [phone] Fax: [phone]  Alvera Singh 03/31/2018, 12:29 PM  [facility] [address], [place], [place], [address] Phone: [phone]   Fax:  [phone]  Name: Darin [name] MRN: [id number] Date of Birth: November 25, 1931

## 2018-04-02 DIAGNOSIS — Z85828 Personal history of other malignant neoplasm of skin: Secondary | ICD-10-CM | POA: Diagnosis not present

## 2018-04-02 DIAGNOSIS — L309 Dermatitis, unspecified: Secondary | ICD-10-CM | POA: Diagnosis not present

## 2018-04-02 IMAGING — CT CT HEAD W/O CM
3 series · 14 of 47 positions shown, 16 images · non-contrast
Comparison: CT scan of March 29, 2012.

CLINICAL DATA: Syncope.

EXAM:
CT HEAD WITHOUT CONTRAST
TECHNIQUE: Contiguous axial images were obtained from the base of the skull
through the vertex without intravenous contrast.

[Series 2: head wo · axial · 0.47mm/px · z∈[-136,+14]mm · 8 of 36 slices shown, 10 images]
[im 3/36  brain]
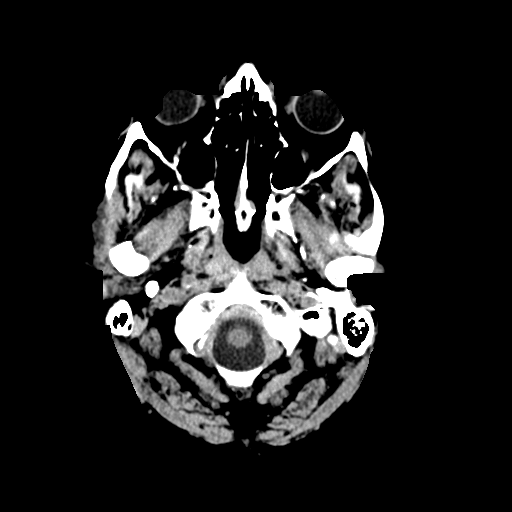
[im 3/36  bone]
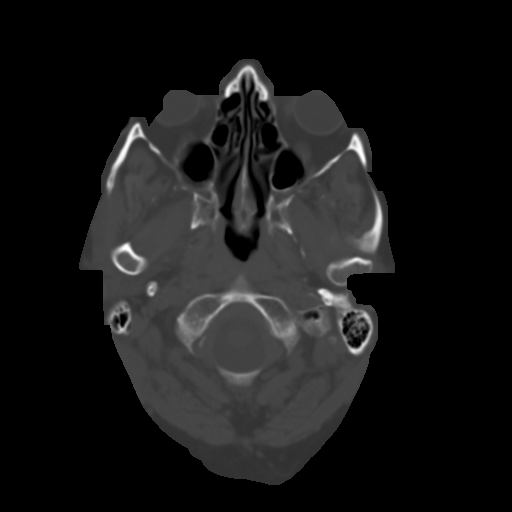
[im 8/36  brain]
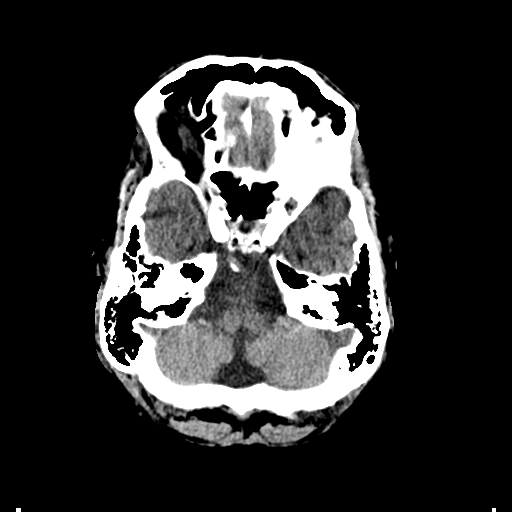
[im 11/36  brain]
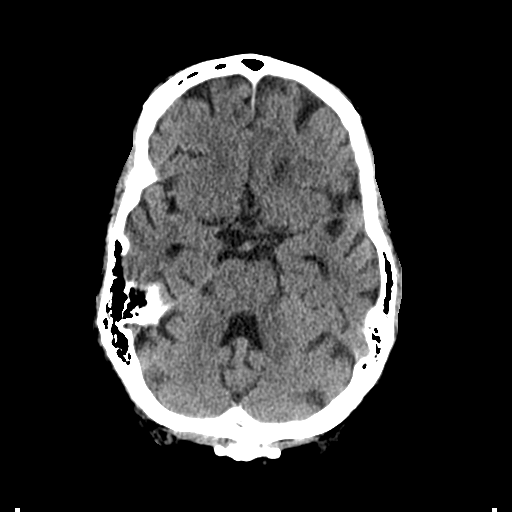
[im 16/36  brain]
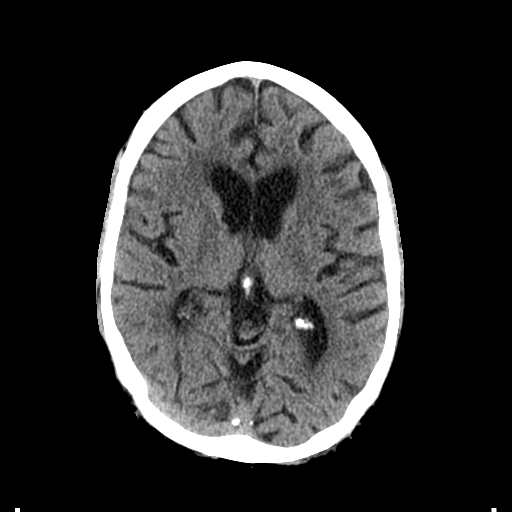
[im 20/36  brain]
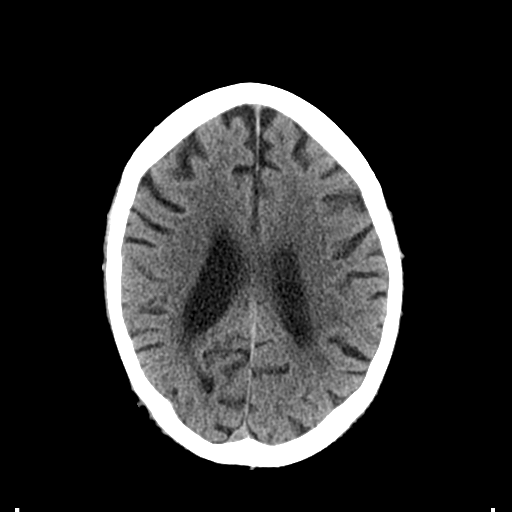
[im 20/36  bone]
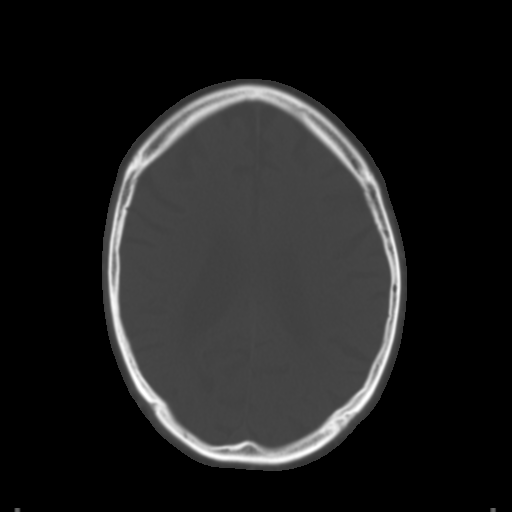
[im 25/36  brain]
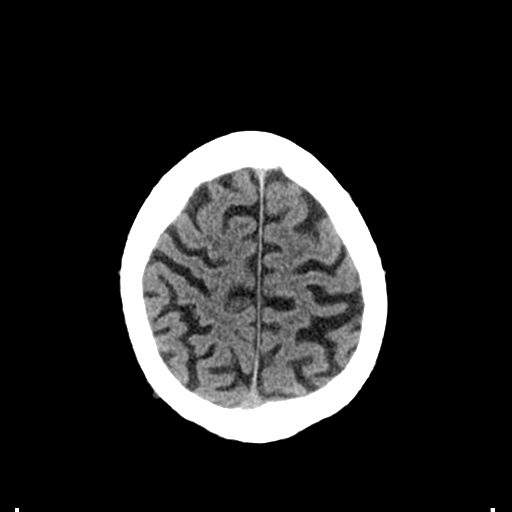
[im 28/36  brain]
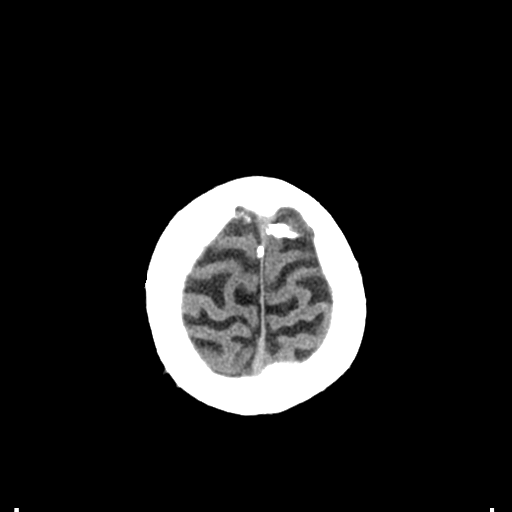
[im 33/36  brain]
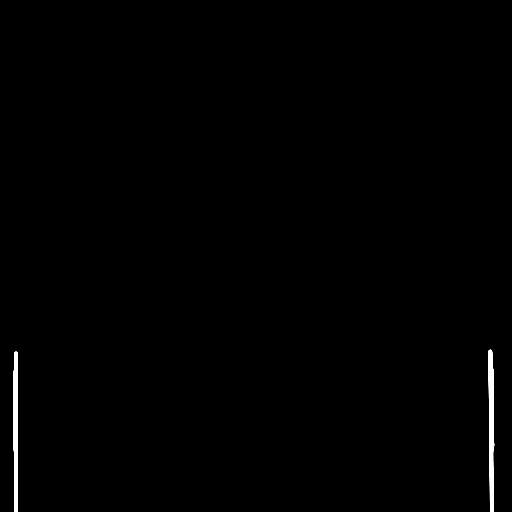

[Series 4: coronal soft tissue · coronal · 0.34mm/px · 3 of 79 slices shown]
[im 27/79  brain]
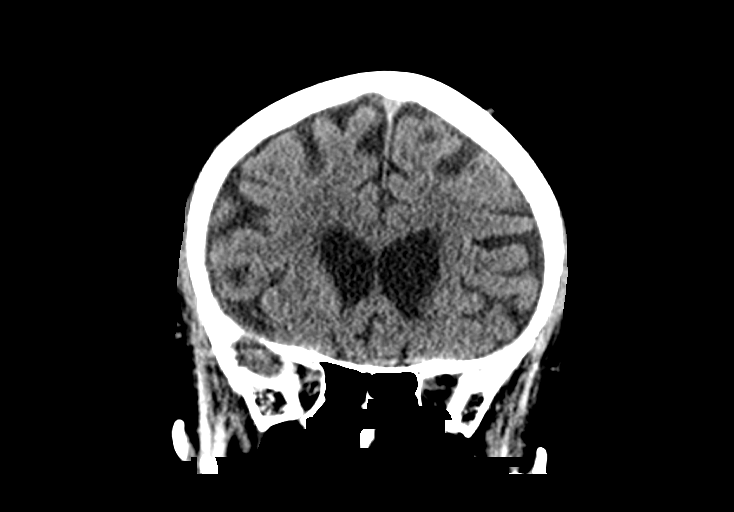
[im 35/79  brain]
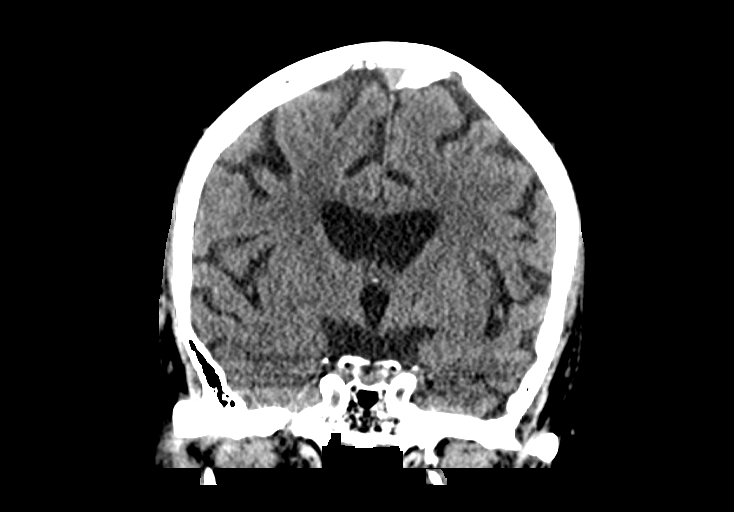
[im 44/79  brain]
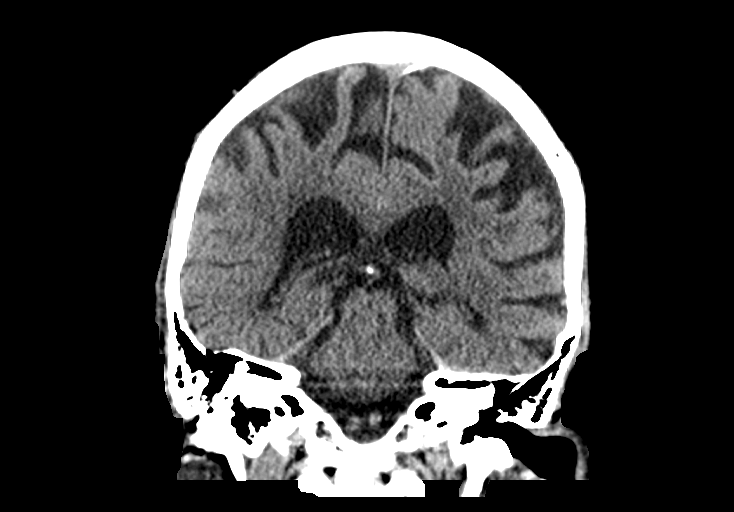

[Series 5: sagittal soft tissue · sagittal · 0.34mm/px · 3 of 68 slices shown]
[im 23/68  brain]
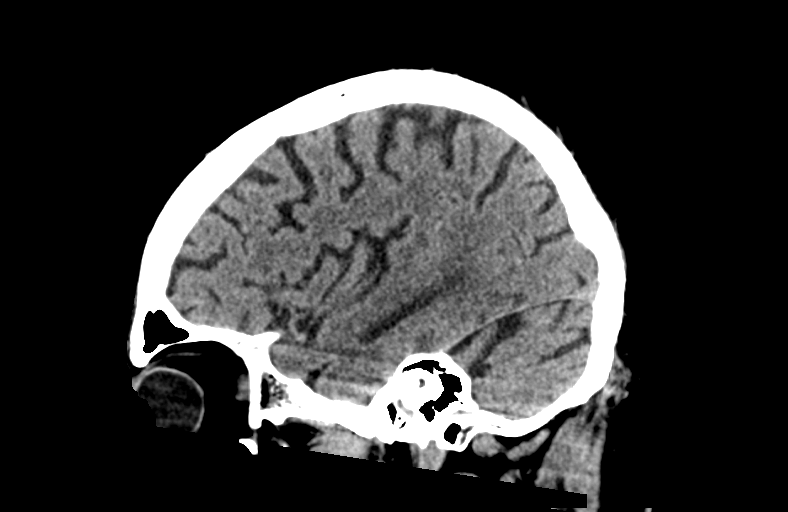
[im 34/68  brain]
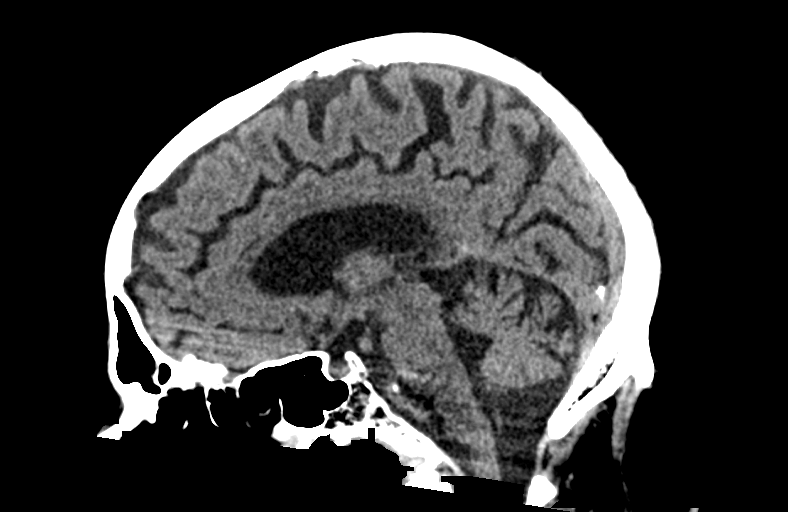
[im 45/68  brain]
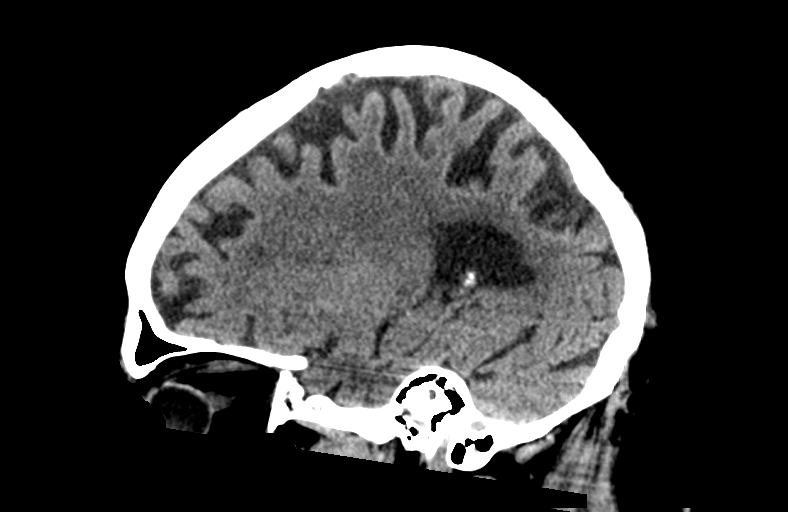

[14 of 47 positions shown; findings below may reference images not displayed]

FINDINGS: Brain: Mild diffuse cortical atrophy is noted. No mass effect or
midline shift is noted. Ventricular size is within normal limits.
There is no evidence of mass lesion, hemorrhage or acute infarction.

Vascular: No hyperdense vessel or unexpected calcification.

Skull: No acute abnormality is seen the calvarium.

Sinuses/Orbits: No acute finding.

Other: None.
IMPRESSION: Mild diffuse cortical atrophy. No acute intracranial abnormality
seen.

## 2018-04-03 ENCOUNTER — Ambulatory Visit: Payer: Medicare Other | Admitting: Physical Therapy

## 2018-04-03 ENCOUNTER — Encounter: Payer: Self-pay | Admitting: Physical Therapy

## 2018-04-03 DIAGNOSIS — M25661 Stiffness of right knee, not elsewhere classified: Secondary | ICD-10-CM | POA: Diagnosis not present

## 2018-04-03 DIAGNOSIS — M25561 Pain in right knee: Secondary | ICD-10-CM | POA: Diagnosis not present

## 2018-04-03 DIAGNOSIS — M6281 Muscle weakness (generalized): Secondary | ICD-10-CM

## 2018-04-03 DIAGNOSIS — R279 Unspecified lack of coordination: Secondary | ICD-10-CM | POA: Diagnosis not present

## 2018-04-03 DIAGNOSIS — G8929 Other chronic pain: Secondary | ICD-10-CM | POA: Diagnosis not present

## 2018-04-03 NOTE — Therapy (Signed)
[facility] [address], [place], [place], [address] Phone: [phone]   Fax:  [phone]  Physical Therapy Treatment  Patient Details  Name: Darin [name] MRN: [id number] Date of Birth: 1931/08/04 Referring Provider (PT): Dr. Hulan Saas   Encounter Date: 04/03/2018  PT End of Session - 04/03/18 0842    Visit Number  6    Date for PT Re-Evaluation  05/07/18    Authorization Type  Medicare    PT Start Time  0804    PT Stop Time  0850    PT Time Calculation (min)  46 min    Activity Tolerance  Patient tolerated treatment well       Past Medical History:  Diagnosis Date  . Arthritis   . GERD (gastroesophageal reflux disease)   . H/O hiatal hernia    "FOR YEARS" " NO PROBLEM"  . Hyperlipidemia   . Hypertension   . Knee pain   . Neuropathy    PERIPHERAL   . Osteoporosis   . PMR (polymyalgia rheumatica) (HCC)   . Polymyalgia ([place]) 5 YEARS    Past Surgical History:  Procedure Laterality Date  . CARPAL TUNNEL RELEASE    . CATARACT EXTRACTION     right  . COLONOSCOPY  2007  . INGUINAL HERNIA REPAIR  10/14/2011   Procedure: HERNIA REPAIR INGUINAL ADULT;  Surgeon: [name], MD;  Location: [facility];  Service: General;  Laterality: Right;  Right Inguinal Hernia Repair with Mesh  . TONSILLECTOMY    . VARICOSE VEIN SURGERY      There were no vitals filed for this visit.  Subjective Assessment - 04/03/18 0808    Subjective  The outside part of my knee really hurts today.  The doctor is going to inject my knee on 2/3 and I leave for [place] on the 20th.    Currently in Pain?  Yes    Pain Score  8     Pain Location  Knee    Pain Orientation  Right;Lateral    Pain Type  Chronic pain                       [facility] Adult PT Treatment/Exercise - 04/03/18 0001      Therapeutic Activites    Therapeutic Activities  ADL's    ADL's  up and down stairs reciprocally and discussed alternate  methods of stair climbing      Neuro Re-ed    Neuro Re-ed Details   visualization of floor ladder walk to increase step length;  standing on varying base of support with head turns, UE reaches      Knee/Hip Exercises: Stretches   Active Hamstring Stretch  Right;Left;20 seconds;3 reps    Active Hamstring Stretch Limitations  seated    Quad Stretch Limitations  2nd step rocks 10x right/left     Other Knee/Hip Stretches  standing slant board 10x right/left      Knee/Hip Exercises: Aerobic   Nustep  seat 13 arms 11 L2 10 min    PT present to discuss progress     Knee/Hip Exercises: Standing   Abduction Limitations  floor slider abduction 10x right/left     Extension Limitations  floor slider 10x right /left extension     Forward Step Up  Right;Left;10 reps;Hand Hold: 2;Step Height: 2"    Gait Training  standing with bil UE shoulder extensions with yellow band 10x    Other Standing Knee  Exercises  box step 5x with minimal UE support    Other Standing Knee Exercises  retro stepping 15x      Knee/Hip Exercises: Seated   Long Arc Quad  Strengthening;Right;Left;20 reps    Long Arc Quad Weight  2 lbs.    Clamshell with TheraBand  Blue   30x double and single   Knee/Hip Flexion  red band 20#    Other Seated Knee/Hip Exercises  ankle eversion red band 20x    Other Seated Knee/Hip Exercises  green band single leg press 20x right/left    Hamstring Limitations  seated rocker board 40x               PT Short Term Goals - 03/12/18 1940      PT SHORT TERM GOAL #1   Title  be independent in initial HEP      Time  4    Period  Weeks    Status  New    Target Date  04/09/18      PT SHORT TERM GOAL #2   Title  The patient will have improved right knee ROM 3-120 degrees needed for greater ease getting up and down from the chair and in/out of the car    Time  4    Period  Weeks    Status  New      PT SHORT TERM GOAL #3   Title  The patient will have improved hip flexor muscle length  to 5 degrees and HS length to 65 degrees needed for improved stride length with ambulation    Time  4    Period  Weeks    Status  New      PT SHORT TERM GOAL #4   Title  Timed up and Go to 16 sec indicating improved gait safety    Time  4    Period  Weeks    Status  New      PT SHORT TERM GOAL #5   Title  Ten meter walk test to 14 sec indicate improved gait speed needed for community ambulation     Time  4    Status  New        PT Long Term Goals - 03/12/18 1943      PT LONG TERM GOAL #1   Title  be independent in advanced HEP for further improvements in strength, ROM and balance      Time  8    Period  Weeks    Status  New    Target Date  05/07/18      PT LONG TERM GOAL #2   Title  Right knee ROM improved to 2-125 degrees needed for greater mobility     Time  8    Period  Weeks    Status  New      PT LONG TERM GOAL #3   Title  Improved right knee extension strength to 3+/5 and knee flexion strength to 4/5 needed for greater ease rising    Time  8    Period  Weeks    Status  New      PT LONG TERM GOAL #4   Title  Improved knee pain with usual ADLs by 40%    Time  8    Period  Weeks    Status  New      PT LONG TERM GOAL #5   Title  The patient will be able to ambulate 500 feet in  six minutes needed for shorter distance community ambulation for traveling    Time  8    Period  Weeks    Status  New            Plan - 04/03/18 0804    Clinical Impression Statement  Patient states "my knee feels looser and a little bit better" pain-wise following treatment session.  "Certainly not worse."   He does report muscular fatigue post treatment session.  He is able to progess intensity and resistance of open chain strengthening of quad muscles today.  He continues to have difficulty balancing with a narrow base of support or without UE support and requires min assist of therapist several times with moderate balance challenges.  Therapist closely monitoring pain and for  overfatigue and modifying treatment accordingly.      Rehab Potential  Good    Clinical Impairments Affecting Rehab Potential  risk for falls    PT Frequency  2x / week    PT Duration  8 weeks    PT Treatment/Interventions  ADLs/Self Care Home Management;Cryotherapy;Electrical Stimulation;Ultrasound;Moist Heat;Iontophoresis 4mg /ml Dexamethasone;Gait training;Stair training;Therapeutic activities;Therapeutic exercise;Patient/family education;Neuromuscular re-education;Manual techniques;Taping;Dry needling    PT Next Visit Plan   Check STGS next week;   Nu-step; heel slides with floor slider;  LAQ, seated clams, marching;  manual therapy for left hip as needed    PT Home Exercise Plan  Access Code: [email or web address]        Patient will benefit from skilled therapeutic intervention in order to improve the following deficits and impairments:  Pain, Decreased range of motion, Decreased strength, Decreased activity tolerance, Impaired perceived functional ability, Difficulty walking, Decreased balance  Visit Diagnosis: Chronic pain of right knee  Stiffness of right knee, not elsewhere classified  Muscle weakness (generalized)     Problem List Patient Active Problem List   Diagnosis Date Noted  . Right wrist pain 02/05/2018  . Degenerative arthritis of right knee 10/23/2017  . Left leg cellulitis 08/12/2017  . Arthritis of right knee 05/20/2017  . Diverticulosis of colon with hemorrhage   . Acute blood loss anemia   . GIB (gastrointestinal bleeding) 03/12/2017  . Leg edema, right 02/20/2015  . BPH (benign prostatic hyperplasia) 12/09/2014  . Chronic radicular low back pain 08/06/2012  . COPD (chronic obstructive pulmonary disease) ([place]) 04/21/2012  . Syncope 03/29/2012  . Hyponatremia 03/29/2012  . Localized osteoarthrosis not specified whether primary or secondary, lower leg 06/20/2010  . VARICOSE VEINS LOWER EXTREMITIES W/INFLAMMATION 02/12/2010  . [place] LOCALIZED OSTEOARTHROSIS INVOLVING  HAND 10/16/2009  . DUPUYTREN'S CONTRACTURE, RIGHT 07/04/2009  . DEGENERATIVE DISC DISEASE, LUMBOSACRAL SPINE W/RADICULOPATHY 06/19/2009  . CARCINOMA, BASAL CELL 02/07/2009  . ANEMIA OF CHRONIC DISEASE 02/07/2009  . BURSITIS, RIGHT SHOULDER 10/07/2008  . CATARACT ASSOCIATED WITH OTHER SYNDROMES 08/01/2008  . DRY SKIN 08/01/2008  . CERUMEN IMPACTION, BILATERAL 03/01/2008  . PMR (polymyalgia rheumatica) ([place]) 01/18/2008  . CRAMP IN LIMB 01/18/2008  . DEPRESSION, SITUATIONAL, ACUTE 12/28/2007  . CONSTIPATION, DRUG INDUCED 12/28/2007  . POLYNEUROPATHY OTHER DISEASES CLASSIFIED ELSW 09/24/2007  . WEIGHT LOSS, ABNORMAL 01/07/2007  . Hyperlipidemia 09/22/2006  . Essential hypertension 09/22/2006  . GERD (gastroesophageal reflux disease) 09/22/2006  . Osteoarthritis 09/22/2006   Ruben Im, PT 04/03/18 10:04 AM Phone: [phone] Fax: [phone] Alvera Singh 04/03/2018, 10:04 AM  [facility] [address] [place], [place], [address] Phone: [phone]   Fax:  [phone]  Name: Darin [name] MRN: [id number] Date of Birth: 23-Feb-1932

## 2018-04-07 ENCOUNTER — Ambulatory Visit: Payer: Medicare Other

## 2018-04-07 DIAGNOSIS — G8929 Other chronic pain: Secondary | ICD-10-CM | POA: Diagnosis not present

## 2018-04-07 DIAGNOSIS — M25561 Pain in right knee: Principal | ICD-10-CM

## 2018-04-07 DIAGNOSIS — M6281 Muscle weakness (generalized): Secondary | ICD-10-CM

## 2018-04-07 DIAGNOSIS — M25661 Stiffness of right knee, not elsewhere classified: Secondary | ICD-10-CM | POA: Diagnosis not present

## 2018-04-07 DIAGNOSIS — R279 Unspecified lack of coordination: Secondary | ICD-10-CM | POA: Diagnosis not present

## 2018-04-07 NOTE — Therapy (Signed)
[facility] [address] [place], [place], [address] Phone: [phone]   Fax:  [phone]  Physical Therapy Treatment  Patient Details  Name: Darin [name] MRN: [id number] Date of Birth: [birth year] Referring Provider (PT): Dr. Hulan Saas   Encounter Date: 04/07/2018  PT End of Session - 04/07/18 0844    Visit Number  7    Date for PT Re-Evaluation  05/07/18    Authorization Type  Medicare    PT Start Time  0803    PT Stop Time  4287    PT Time Calculation (min)  44 min    Activity Tolerance  Patient tolerated treatment well    Behavior During Therapy  [facility] for tasks assessed/performed       Past Medical History:  Diagnosis Date  . Arthritis   . GERD (gastroesophageal reflux disease)   . H/O hiatal hernia    "FOR YEARS" " NO PROBLEM"  . Hyperlipidemia   . Hypertension   . Knee pain   . Neuropathy    PERIPHERAL   . Osteoporosis   . PMR (polymyalgia rheumatica) (HCC)   . Polymyalgia ([place]) 5 YEARS    Past Surgical History:  Procedure Laterality Date  . CARPAL TUNNEL RELEASE    . CATARACT EXTRACTION     right  . COLONOSCOPY  2007  . INGUINAL HERNIA REPAIR  10/14/2011   Procedure: HERNIA REPAIR INGUINAL ADULT;  Surgeon: [name], MD;  Location: [place];  Service: General;  Laterality: Right;  Right Inguinal Hernia Repair with Mesh  . TONSILLECTOMY    . VARICOSE VEIN SURGERY      There were no vitals filed for this visit.  Subjective Assessment - 04/07/18 0812    Subjective  I am trying to work harder so that I am ready for my trip  My knee isn't bothering me.      Patient Stated Goals  I would like to be free of pain and be able to walk with a cane    Currently in Pain?  No/denies         [facility] PT Assessment - 04/07/18 0001      Standardized Balance Assessment   10 Meter Walk  16.95 sec      Timed Up and Go Test   TUG  Normal TUG    Normal TUG (seconds)  16    TUG Comments  with  RW                   [facility] Adult PT Treatment/Exercise - 04/07/18 0001      Knee/Hip Exercises: Stretches   Active Hamstring Stretch  Right;Left;20 seconds;3 reps    Active Hamstring Stretch Limitations  seated    Quad Stretch Limitations  2nd step rocks 10x right/left     Other Knee/Hip Stretches  standing slant board 10x right/left      Knee/Hip Exercises: Aerobic   Nustep  seat 13 arms 11 L2 10 min    PT present to discuss progress- steady pace     Knee/Hip Exercises: Standing   Abduction Limitations  floor slider abduction 2x10 right/left     Extension Limitations  floor slider 10x right /left extension     Forward Step Up  Right;Left;10 reps;Hand Hold: 2;Step Height: 6"   increased challenge on the Rt    Other Standing Knee Exercises  alternating toe taps on 6 inch step 2x10 bil with UE support    Other  Standing Knee Exercises  retro stepping 15x      Knee/Hip Exercises: Seated   Long Arc Quad  Strengthening;Right;Left;20 reps    Long Arc Quad Weight  2 lbs.    Clamshell with TheraBand  Blue   30x double and single   Knee/Hip Flexion  red band 20x               PT Short Term Goals - 04/07/18 0818      PT SHORT TERM GOAL #1   Title  be independent in initial HEP      Status  Achieved      PT SHORT TERM GOAL #4   Title  Timed up and Go to 16 sec indicating improved gait safety    Baseline  16    Status  Achieved      PT SHORT TERM GOAL #5   Title  Ten meter walk test to 14 sec indicate improved gait speed needed for community ambulation     Baseline  16.95    Time  4    Period  Weeks    Status  On-going        PT Long Term Goals - 03/12/18 1943      PT LONG TERM GOAL #1   Title  be independent in advanced HEP for further improvements in strength, ROM and balance      Time  8    Period  Weeks    Status  New    Target Date  05/07/18      PT LONG TERM GOAL #2   Title  Right knee ROM improved to 2-125 degrees needed for greater  mobility     Time  8    Period  Weeks    Status  New      PT LONG TERM GOAL #3   Title  Improved right knee extension strength to 3+/5 and knee flexion strength to 4/5 needed for greater ease rising    Time  8    Period  Weeks    Status  New      PT LONG TERM GOAL #4   Title  Improved knee pain with usual ADLs by 40%    Time  8    Period  Weeks    Status  New      PT LONG TERM GOAL #5   Title  The patient will be able to ambulate 500 feet in six minutes needed for shorter distance community ambulation for traveling    Time  8    Period  Weeks    Status  New            Plan - 04/07/18 0818    Clinical Impression Statement  PT continues to address chronic strength and endurance deficits and gait abnormality with exercise in the clinic.  Pt plans to go to [place] next month and goal is to improve safety during this trip.  Pt requires close stand by assistance with all standing exercises and remains challenged with standing balance activities.  Pt has been able to progress to higher intensity with resistance and complexity of exercises over the past 2 weeks in the clinic.  TUG is improved by 3 seconds today.  No change in 10 meter walk test.   Pt will continue to benefit from skilled PT for strength, balance and endurance tasks.      Rehab Potential  Good    Clinical Impairments Affecting Rehab Potential  risk for  falls    PT Frequency  2x / week    PT Duration  8 weeks    PT Treatment/Interventions  ADLs/Self Care Home Management;Cryotherapy;Electrical Stimulation;Ultrasound;Moist Heat;Iontophoresis 4mg /ml Dexamethasone;Gait training;Stair training;Therapeutic activities;Therapeutic exercise;Patient/family education;Neuromuscular re-education;Manual techniques;Taping;Dry needling    PT Next Visit Plan  measure hip flexor, hamstring and knee ROM to assess STGs, continue balance, strength and endurance    PT Home Exercise Plan  Access Code: [email or web address]     Consulted and Agree with Plan  of Care  Patient       Patient will benefit from skilled therapeutic intervention in order to improve the following deficits and impairments:  Pain, Decreased range of motion, Decreased strength, Decreased activity tolerance, Impaired perceived functional ability, Difficulty walking, Decreased balance  Visit Diagnosis: Chronic pain of right knee  Stiffness of right knee, not elsewhere classified  Muscle weakness (generalized)     Problem List Patient Active Problem List   Diagnosis Date Noted  . Right wrist pain 02/05/2018  . Degenerative arthritis of right knee 10/23/2017  . Left leg cellulitis 08/12/2017  . Arthritis of right knee 05/20/2017  . Diverticulosis of colon with hemorrhage   . Acute blood loss anemia   . GIB (gastrointestinal bleeding) 03/12/2017  . Leg edema, right 02/20/2015  . BPH (benign prostatic hyperplasia) 12/09/2014  . Chronic radicular low back pain 08/06/2012  . COPD (chronic obstructive pulmonary disease) ([place]) 04/21/2012  . Syncope 03/29/2012  . Hyponatremia 03/29/2012  . Localized osteoarthrosis not specified whether primary or secondary, lower leg 06/20/2010  . VARICOSE VEINS LOWER EXTREMITIES W/INFLAMMATION 02/12/2010  . [place] LOCALIZED OSTEOARTHROSIS INVOLVING HAND 10/16/2009  . DUPUYTREN'S CONTRACTURE, RIGHT 07/04/2009  . DEGENERATIVE DISC DISEASE, LUMBOSACRAL SPINE W/RADICULOPATHY 06/19/2009  . CARCINOMA, BASAL CELL 02/07/2009  . ANEMIA OF CHRONIC DISEASE 02/07/2009  . BURSITIS, RIGHT SHOULDER 10/07/2008  . CATARACT ASSOCIATED WITH OTHER SYNDROMES 08/01/2008  . DRY SKIN 08/01/2008  . CERUMEN IMPACTION, BILATERAL 03/01/2008  . PMR (polymyalgia rheumatica) ([place]) 01/18/2008  . CRAMP IN LIMB 01/18/2008  . DEPRESSION, SITUATIONAL, ACUTE 12/28/2007  . CONSTIPATION, DRUG INDUCED 12/28/2007  . POLYNEUROPATHY OTHER DISEASES CLASSIFIED ELSW 09/24/2007  . WEIGHT LOSS, ABNORMAL 01/07/2007  . Hyperlipidemia 09/22/2006  . Essential hypertension  09/22/2006  . GERD (gastroesophageal reflux disease) 09/22/2006  . Osteoarthritis 09/22/2006   Sigurd Sos, PT 04/07/18 8:51 AM  [facility] [address], [place], [place], [address] Phone: [phone]   Fax:  [phone]  Name: Quin Mathenia MRN: [id number] Date of Birth: 03/18/1932

## 2018-04-08 ENCOUNTER — Ambulatory Visit (INDEPENDENT_AMBULATORY_CARE_PROVIDER_SITE_OTHER): Payer: Medicare Other | Admitting: Family Medicine

## 2018-04-08 ENCOUNTER — Encounter: Payer: Self-pay | Admitting: Family Medicine

## 2018-04-08 VITALS — BP 124/58 | HR 78 | Temp 97.6°F | Ht 73.0 in | Wt 200.2 lb

## 2018-04-08 DIAGNOSIS — N400 Enlarged prostate without lower urinary tract symptoms: Secondary | ICD-10-CM

## 2018-04-08 DIAGNOSIS — R21 Rash and other nonspecific skin eruption: Secondary | ICD-10-CM | POA: Diagnosis not present

## 2018-04-08 DIAGNOSIS — G63 Polyneuropathy in diseases classified elsewhere: Secondary | ICD-10-CM | POA: Diagnosis not present

## 2018-04-08 DIAGNOSIS — I10 Essential (primary) hypertension: Secondary | ICD-10-CM

## 2018-04-08 LAB — BASIC METABOLIC PANEL
BUN: 19 mg/dL (ref 6–23)
CALCIUM: 9.7 mg/dL (ref 8.4–10.5)
CHLORIDE: 99 meq/L (ref 96–112)
CO2: 34 mEq/L — ABNORMAL HIGH (ref 19–32)
Creatinine, Ser: 0.77 mg/dL (ref 0.40–1.50)
GFR: 102.17 mL/min (ref >60.00)
Glucose, Bld: 60 mg/dL — ABNORMAL LOW (ref 70–99)
Potassium: 4 mEq/L (ref 3.5–5.1)
Sodium: 137 mEq/L (ref 135–145)

## 2018-04-08 NOTE — Progress Notes (Signed)
Subjective:     Patient ID: Darin [name], male   DOB: 1931-06-25, 85 y.o.   MRN: [id number]  HPI Patient seen for follow-up with several issues as follows  Recent scaly pruritic rash left leg.  He was seen here and prescribed triamcinolone ointment and after about 4 days his rash had cleared.  He is very pleased with the results.  Tends to have very dry skin involving the feet  Hypertension currently treated with amlodipine and benazepril HCTZ.  Blood pressure well controlled.  No dizziness.  No chest pains.  No peripheral edema.  He has history of polyneuropathy with pain mostly lower extremities.  He has been on regimen of gabapentin 200 mg twice daily for quite some time and this seems to be controlling his neuropathy symptoms fairly well.  BPH treated with finasteride 5 mg daily.  Urine flow is fairly strong  Past Medical History:  Diagnosis Date  . Arthritis   . GERD (gastroesophageal reflux disease)   . H/O hiatal hernia    "FOR YEARS" " NO PROBLEM"  . Hyperlipidemia   . Hypertension   . Knee pain   . Neuropathy    PERIPHERAL   . Osteoporosis   . PMR (polymyalgia rheumatica) (HCC)   . Polymyalgia ([place]) 5 YEARS   Past Surgical History:  Procedure Laterality Date  . CARPAL TUNNEL RELEASE    . CATARACT EXTRACTION     right  . COLONOSCOPY  2007  . INGUINAL HERNIA REPAIR  10/14/2011   Procedure: HERNIA REPAIR INGUINAL ADULT;  Surgeon: [name], MD;  Location: [place];  Service: General;  Laterality: Right;  Right Inguinal Hernia Repair with Mesh  . TONSILLECTOMY    . VARICOSE VEIN SURGERY      reports that he has never smoked. He has never used smokeless tobacco. He reports current alcohol use of about 3.0 standard drinks of alcohol per week. He reports that he does not use drugs. family history includes Coronary artery disease in his unknown relative; Heart disease in his father. Allergies  Allergen Reactions  . Statins Other (See Comments)    Muscle pain and  upset stomach     Review of Systems  Constitutional: Negative for fatigue and unexpected weight change.  Eyes: Negative for visual disturbance.  Respiratory: Negative for cough, chest tightness and shortness of breath.   Cardiovascular: Negative for chest pain, palpitations and leg swelling.  Neurological: Negative for dizziness, syncope, weakness, light-headedness and headaches.       Objective:   Physical Exam Constitutional:      Appearance: He is well-developed.  HENT:     Right Ear: External ear normal.     Left Ear: External ear normal.  Eyes:     Pupils: Pupils are equal, round, and reactive to light.  Neck:     Musculoskeletal: Neck supple.     Thyroid: No thyromegaly.  Cardiovascular:     Rate and Rhythm: Normal rate and regular rhythm.  Pulmonary:     Effort: Pulmonary effort is normal. No respiratory distress.     Breath sounds: Normal breath sounds. No wheezing or rales.  Skin:    Comments: He has very dry skin diffusely involving the left foot.  Left leg rash is cleared  Neurological:     Mental Status: He is alert and oriented to person, place, and time.        Assessment:     #1 hypertension stable and at goal  #2 history of polyneuropathy  involving the legs which is stable on low-dose gabapentin  #3 BPH stable on finasteride  #4 eczematous skin rash left leg improved with topical steroids    Plan:     -Recheck basic metabolic panel -Discussed measures to reduce dry skin specifically reduce bathing time, avoidance of hot water, application of moisturizer after bathing -We will plan routine follow-up in 6 months and sooner as needed  [name] MD [facility]

## 2018-04-10 ENCOUNTER — Encounter: Payer: Self-pay | Admitting: Physical Therapy

## 2018-04-10 ENCOUNTER — Ambulatory Visit: Payer: Medicare Other | Admitting: Physical Therapy

## 2018-04-10 ENCOUNTER — Other Ambulatory Visit: Payer: Self-pay | Admitting: Family Medicine

## 2018-04-10 DIAGNOSIS — G8929 Other chronic pain: Secondary | ICD-10-CM

## 2018-04-10 DIAGNOSIS — M6281 Muscle weakness (generalized): Secondary | ICD-10-CM | POA: Diagnosis not present

## 2018-04-10 DIAGNOSIS — M25561 Pain in right knee: Principal | ICD-10-CM

## 2018-04-10 DIAGNOSIS — R279 Unspecified lack of coordination: Secondary | ICD-10-CM | POA: Diagnosis not present

## 2018-04-10 DIAGNOSIS — M25661 Stiffness of right knee, not elsewhere classified: Secondary | ICD-10-CM | POA: Diagnosis not present

## 2018-04-10 NOTE — Therapy (Signed)
[facility] [address] [place], [place], [address] Phone: [phone]   Fax:  [phone]  Physical Therapy Treatment  Patient Details  Name: Darin [name] MRN: [id number] Date of Birth: [birth year] Referring Provider (PT): Dr. Hulan Saas   Encounter Date: 04/10/2018  PT End of Session - 04/10/18 0854    Visit Number  8    Date for PT Re-Evaluation  05/07/18    Authorization Type  Medicare    PT Start Time  0812   pt late   PT Stop Time  0850    PT Time Calculation (min)  38 min    Activity Tolerance  Patient tolerated treatment well       Past Medical History:  Diagnosis Date  . Arthritis   . GERD (gastroesophageal reflux disease)   . H/O hiatal hernia    "FOR YEARS" " NO PROBLEM"  . Hyperlipidemia   . Hypertension   . Knee pain   . Neuropathy    PERIPHERAL   . Osteoporosis   . PMR (polymyalgia rheumatica) (HCC)   . Polymyalgia ([place]) 5 YEARS    Past Surgical History:  Procedure Laterality Date  . CARPAL TUNNEL RELEASE    . CATARACT EXTRACTION     right  . COLONOSCOPY  2007  . INGUINAL HERNIA REPAIR  10/14/2011   Procedure: HERNIA REPAIR INGUINAL ADULT;  Surgeon: [name], MD;  Location: [place];  Service: General;  Laterality: Right;  Right Inguinal Hernia Repair with Mesh  . TONSILLECTOMY    . VARICOSE VEIN SURGERY      There were no vitals filed for this visit.  Subjective Assessment - 04/10/18 0814    Subjective  Patient arrives late stating, "it was hard getting going this morning."  Reports his knee is "bad."  "It's giving me a fit this morning."  States he felt good following last treatment session.      Currently in Pain?  Yes    Pain Score  8     Pain Orientation  Right    Aggravating Factors   sit to stand, walking, standing                       [facility] Adult PT Treatment/Exercise - 04/10/18 0001      Knee/Hip Exercises: Stretches   Sports administrator  Limitations  2nd step rocks 10x right/left with single arm reach overhead    Other Knee/Hip Stretches  standing slant board 10x right/left      Knee/Hip Exercises: Aerobic   Nustep  seat 13 arms 11 L2 10 min    PT present to discuss progress- steady pace     Knee/Hip Exercises: Machines for Strengthening   Cybex Leg Press  55# bil 20x; 35# single leg right/left 20x each       Knee/Hip Exercises: Standing   Other Standing Knee Exercises  --      Knee/Hip Exercises: Seated   Long Arc Quad  Strengthening;Right;Left;20 reps    Long Arc Quad Weight  3 lbs.    Knee/Hip Flexion  3# from high table 30x     Other Seated Knee/Hip Exercises  red band around knee with TKEs press foot into the floor 10x    Sit to Sand  --   on high mat table with hands resting on therapists shoulders              PT Short Term Goals - 04/10/18  0945      PT SHORT TERM GOAL #1   Title  be independent in initial HEP      Status  Achieved      PT SHORT TERM GOAL #2   Title  The patient will have improved right knee ROM 3-120 degrees needed for greater ease getting up and down from the chair and in/out of the car    Time  4    Period  Weeks    Status  On-going      PT SHORT TERM GOAL #3   Title  The patient will have improved hip flexor muscle length to 5 degrees and HS length to 65 degrees needed for improved stride length with ambulation    Time  4    Period  Weeks    Status  On-going      PT SHORT TERM GOAL #4   Title  Timed up and Go to 16 sec indicating improved gait safety    Status  Achieved      PT SHORT TERM GOAL #5   Title  Ten meter walk test to 14 sec indicate improved gait speed needed for community ambulation     Time  4    Period  Weeks    Status  On-going        PT Long Term Goals - 03/12/18 1943      PT LONG TERM GOAL #1   Title  be independent in advanced HEP for further improvements in strength, ROM and balance      Time  8    Period  Weeks    Status  New     Target Date  05/07/18      PT LONG TERM GOAL #2   Title  Right knee ROM improved to 2-125 degrees needed for greater mobility     Time  8    Period  Weeks    Status  New      PT LONG TERM GOAL #3   Title  Improved right knee extension strength to 3+/5 and knee flexion strength to 4/5 needed for greater ease rising    Time  8    Period  Weeks    Status  New      PT LONG TERM GOAL #4   Title  Improved knee pain with usual ADLs by 40%    Time  8    Period  Weeks    Status  New      PT LONG TERM GOAL #5   Title  The patient will be able to ambulate 500 feet in six minutes needed for shorter distance community ambulation for traveling    Time  [place] - 04/10/18 0539    Clinical Impression Statement  The patient complains of more knee pain today but the most limiting factor  is his complaint of dizziness.  Treatment modified with more seated LE strengthening.  He is able to increase resistance with quad strengthening and add leg press machine without exacerbation of knee pain.  His transition with sit to stand is much improved with his hands resting on therapist shoulders to encourage more of a "nose over knees" motor pattern.   Therapist closely monitoring response with all treatment interventions.   Check ROM for STGs next visit.  Limited time today secondary to patient arriving late.  Rehab Potential  Good    Clinical Impairments Affecting Rehab Potential  risk for falls    PT Frequency  2x / week    PT Duration  8 weeks    PT Treatment/Interventions  ADLs/Self Care Home Management;Cryotherapy;Electrical Stimulation;Ultrasound;Moist Heat;Iontophoresis 4mg /ml Dexamethasone;Gait training;Stair training;Therapeutic activities;Therapeutic exercise;Patient/family education;Neuromuscular re-education;Manual techniques;Taping;Dry needling    PT Next Visit Plan  measure hip flexor, hamstring and knee ROM to assess STGs, continue balance,  strength and endurance    PT Home Exercise Plan  Access Code: [email or web address]        Patient will benefit from skilled therapeutic intervention in order to improve the following deficits and impairments:  Pain, Decreased range of motion, Decreased strength, Decreased activity tolerance, Impaired perceived functional ability, Difficulty walking, Decreased balance  Visit Diagnosis: Chronic pain of right knee  Stiffness of right knee, not elsewhere classified  Muscle weakness (generalized)     Problem List Patient Active Problem List   Diagnosis Date Noted  . Right wrist pain 02/05/2018  . Degenerative arthritis of right knee 10/23/2017  . Left leg cellulitis 08/12/2017  . Arthritis of right knee 05/20/2017  . Diverticulosis of colon with hemorrhage   . Acute blood loss anemia   . GIB (gastrointestinal bleeding) 03/12/2017  . Leg edema, right 02/20/2015  . BPH (benign prostatic hyperplasia) 12/09/2014  . Chronic radicular low back pain 08/06/2012  . COPD (chronic obstructive pulmonary disease) ([place]) 04/21/2012  . Syncope 03/29/2012  . Hyponatremia 03/29/2012  . Localized osteoarthrosis not specified whether primary or secondary, lower leg 06/20/2010  . VARICOSE VEINS LOWER EXTREMITIES W/INFLAMMATION 02/12/2010  . [place] LOCALIZED OSTEOARTHROSIS INVOLVING HAND 10/16/2009  . DUPUYTREN'S CONTRACTURE, RIGHT 07/04/2009  . DEGENERATIVE DISC DISEASE, LUMBOSACRAL SPINE W/RADICULOPATHY 06/19/2009  . CARCINOMA, BASAL CELL 02/07/2009  . ANEMIA OF CHRONIC DISEASE 02/07/2009  . BURSITIS, RIGHT SHOULDER 10/07/2008  . CATARACT ASSOCIATED WITH OTHER SYNDROMES 08/01/2008  . DRY SKIN 08/01/2008  . CERUMEN IMPACTION, BILATERAL 03/01/2008  . PMR (polymyalgia rheumatica) ([place]) 01/18/2008  . CRAMP IN LIMB 01/18/2008  . DEPRESSION, SITUATIONAL, ACUTE 12/28/2007  . CONSTIPATION, DRUG INDUCED 12/28/2007  . POLYNEUROPATHY OTHER DISEASES CLASSIFIED ELSW 09/24/2007  . WEIGHT LOSS, ABNORMAL 01/07/2007  .  Hyperlipidemia 09/22/2006  . Essential hypertension 09/22/2006  . GERD (gastroesophageal reflux disease) 09/22/2006  . Osteoarthritis 09/22/2006   Ruben Im, PT 04/10/18 9:48 AM Phone: [phone] Fax: [phone]  Alvera Singh 04/10/2018, 9:48 AM  [facility] [address] [place], [place], [address] Phone: [phone]   Fax:  [phone]  Name: Yadiel Aubry MRN: [id number] Date of Birth: Jun 30, 1931

## 2018-04-14 ENCOUNTER — Ambulatory Visit: Payer: Medicare Other

## 2018-04-14 DIAGNOSIS — R279 Unspecified lack of coordination: Secondary | ICD-10-CM | POA: Diagnosis not present

## 2018-04-14 DIAGNOSIS — M6281 Muscle weakness (generalized): Secondary | ICD-10-CM | POA: Diagnosis not present

## 2018-04-14 DIAGNOSIS — M25661 Stiffness of right knee, not elsewhere classified: Secondary | ICD-10-CM

## 2018-04-14 DIAGNOSIS — G8929 Other chronic pain: Secondary | ICD-10-CM

## 2018-04-14 DIAGNOSIS — M25561 Pain in right knee: Secondary | ICD-10-CM | POA: Diagnosis not present

## 2018-04-14 NOTE — Therapy (Signed)
[facility] [address], [place], [place], [address] Phone: [phone]   Fax:  [phone]  Physical Therapy Treatment  Patient Details  Name: Darin [name] MRN: [id number] Date of Birth: 12/24/1931 Referring Provider (PT): Dr. Hulan Saas   Encounter Date: 04/14/2018  PT End of Session - 04/14/18 0842    Visit Number  9    Date for PT Re-Evaluation  05/07/18    Authorization Type  Medicare    PT Start Time  0803    PT Stop Time  0842    PT Time Calculation (min)  39 min    Activity Tolerance  Patient tolerated treatment well    Behavior During Therapy  [facility] for tasks assessed/performed       Past Medical History:  Diagnosis Date  . Arthritis   . GERD (gastroesophageal reflux disease)   . H/O hiatal hernia    "FOR YEARS" " NO PROBLEM"  . Hyperlipidemia   . Hypertension   . Knee pain   . Neuropathy    PERIPHERAL   . Osteoporosis   . PMR (polymyalgia rheumatica) (HCC)   . Polymyalgia ([place]) 5 YEARS    Past Surgical History:  Procedure Laterality Date  . CARPAL TUNNEL RELEASE    . CATARACT EXTRACTION     right  . COLONOSCOPY  2007  . INGUINAL HERNIA REPAIR  10/14/2011   Procedure: HERNIA REPAIR INGUINAL ADULT;  Surgeon: [name], MD;  Location: [place];  Service: General;  Laterality: Right;  Right Inguinal Hernia Repair with Mesh  . TONSILLECTOMY    . VARICOSE VEIN SURGERY      There were no vitals filed for this visit.  Subjective Assessment - 04/14/18 0807    Subjective  I only have a little bit of knee pain.  It catches sometimes when I am walking.      Currently in Pain?  Yes    Pain Score  3     Pain Location  Knee    Pain Orientation  Right    Pain Descriptors / Indicators  Aching    Pain Type  Chronic pain    Pain Onset  More than a month ago    Pain Frequency  Constant    Aggravating Factors   when it catches, standing, walking    Pain Relieving Factors  rest          [facility] PT Assessment - 04/14/18 0001      Strength   Right Knee Flexion  4-/5    Right Knee Extension  4/5      Timed Up and Go Test   TUG  Normal TUG   regular height chair- black pads not added   Normal TUG (seconds)  18.26    TUG Comments  with RW                   [facility] Adult PT Treatment/Exercise - 04/14/18 0001      Knee/Hip Exercises: Stretches   Sports administrator Limitations  2nd step rocks 10x right/left with single arm reach overhead    Other Knee/Hip Stretches  standing slant board 10x right/left      Knee/Hip Exercises: Aerobic   Nustep  seat 13 arms 11 L2 10 min    PT present to discuss progress- steady pace     Knee/Hip Exercises: Machines for Strengthening   Cybex Leg Press  55# bil 20x; 35# single leg right/left 20x each  Knee/Hip Exercises: Standing   Abduction Limitations  floor slider abduction 2x10 right/left     Extension Limitations  floor slider 10x right /left extension       Knee/Hip Exercises: Seated   Long Arc Quad  Strengthening;Right;Left;20 reps    Long Arc Quad Weight  3 lbs.    Knee/Hip Flexion  red band 20x               PT Short Term Goals - 04/10/18 0945      PT SHORT TERM GOAL #1   Title  be independent in initial HEP      Status  Achieved      PT SHORT TERM GOAL #2   Title  The patient will have improved right knee ROM 3-120 degrees needed for greater ease getting up and down from the chair and in/out of the car    Time  4    Period  Weeks    Status  On-going      PT SHORT TERM GOAL #3   Title  The patient will have improved hip flexor muscle length to 5 degrees and HS length to 65 degrees needed for improved stride length with ambulation    Time  4    Period  Weeks    Status  On-going      PT SHORT TERM GOAL #4   Title  Timed up and Go to 16 sec indicating improved gait safety    Status  Achieved      PT SHORT TERM GOAL #5   Title  Ten meter walk test to 14 sec indicate improved gait speed  needed for community ambulation     Time  4    Period  Weeks    Status  On-going        PT Long Term Goals - 04/14/18 0808      PT LONG TERM GOAL #3   Title  Improved right knee extension strength to 3+/5 and knee flexion strength to 4/5 needed for greater ease rising    Baseline  extension 4/5, flexion 4-/5    Time  8    Period  Weeks    Status  On-going      PT LONG TERM GOAL #4   Title  Improved knee pain with usual ADLs by 40%    Baseline  40% better    Status  Achieved            Plan - 04/14/18 0826    Clinical Impression Statement  Pt continues to work on strength and balance/mobility to prepare for trip to [place] and improve safety and endurance at home and in the community.  Pt reports that Rt knee pain is improved by 40% overall.  TUG was 18.26 seconds today using rolling walker and no foam pads in the chair and this is improved from evaluation.  Rt quad strength is 4/5 and flexion 4-/5 improved from evaluation as well.  Pt continues to require verbal cues for step length and stand by assistance for safety with exercise.  Pt will continue to benefit from skilled PT for strength, endurance and balance.      Rehab Potential  Good    Clinical Impairments Affecting Rehab Potential  risk for falls    PT Frequency  2x / week    PT Duration  8 weeks    PT Treatment/Interventions  ADLs/Self Care Home Management;Cryotherapy;Electrical Stimulation;Ultrasound;Moist Heat;Iontophoresis 4mg /ml Dexamethasone;Gait training;Stair training;Therapeutic activities;Therapeutic exercise;Patient/family education;Neuromuscular re-education;Manual techniques;Taping;Dry needling  PT Next Visit Plan  10th visit progress note.  Test hip flexor and hamstring ROM for STGs.  Continue balance, strength and enduarnce    PT Home Exercise Plan  Access Code: [email or web address]     Consulted and Agree with Plan of Care  Patient       Patient will benefit from skilled therapeutic intervention in order to  improve the following deficits and impairments:  Pain, Decreased range of motion, Decreased strength, Decreased activity tolerance, Impaired perceived functional ability, Difficulty walking, Decreased balance  Visit Diagnosis: Chronic pain of right knee  Stiffness of right knee, not elsewhere classified  Muscle weakness (generalized)  Unspecified lack of coordination     Problem List Patient Active Problem List   Diagnosis Date Noted  . Right wrist pain 02/05/2018  . Degenerative arthritis of right knee 10/23/2017  . Left leg cellulitis 08/12/2017  . Arthritis of right knee 05/20/2017  . Diverticulosis of colon with hemorrhage   . Acute blood loss anemia   . GIB (gastrointestinal bleeding) 03/12/2017  . Leg edema, right 02/20/2015  . BPH (benign prostatic hyperplasia) 12/09/2014  . Chronic radicular low back pain 08/06/2012  . COPD (chronic obstructive pulmonary disease) ([place]) 04/21/2012  . Syncope 03/29/2012  . Hyponatremia 03/29/2012  . Localized osteoarthrosis not specified whether primary or secondary, lower leg 06/20/2010  . VARICOSE VEINS LOWER EXTREMITIES W/INFLAMMATION 02/12/2010  . [place] LOCALIZED OSTEOARTHROSIS INVOLVING HAND 10/16/2009  . DUPUYTREN'S CONTRACTURE, RIGHT 07/04/2009  . DEGENERATIVE DISC DISEASE, LUMBOSACRAL SPINE W/RADICULOPATHY 06/19/2009  . CARCINOMA, BASAL CELL 02/07/2009  . ANEMIA OF CHRONIC DISEASE 02/07/2009  . BURSITIS, RIGHT SHOULDER 10/07/2008  . CATARACT ASSOCIATED WITH OTHER SYNDROMES 08/01/2008  . DRY SKIN 08/01/2008  . CERUMEN IMPACTION, BILATERAL 03/01/2008  . PMR (polymyalgia rheumatica) ([place]) 01/18/2008  . CRAMP IN LIMB 01/18/2008  . DEPRESSION, SITUATIONAL, ACUTE 12/28/2007  . CONSTIPATION, DRUG INDUCED 12/28/2007  . POLYNEUROPATHY OTHER DISEASES CLASSIFIED ELSW 09/24/2007  . WEIGHT LOSS, ABNORMAL 01/07/2007  . Hyperlipidemia 09/22/2006  . Essential hypertension 09/22/2006  . GERD (gastroesophageal reflux disease) 09/22/2006  .  Osteoarthritis 09/22/2006    Sigurd Sos, PT 04/14/18 8:44 AM  [facility] [address] [place], [place], [address] Phone: [phone]   Fax:  [phone]  Name: Darin [name] MRN: [id number] Date of Birth: 02-08-1932

## 2018-04-17 ENCOUNTER — Ambulatory Visit: Payer: Medicare Other | Admitting: Physical Therapy

## 2018-04-17 ENCOUNTER — Encounter: Payer: Self-pay | Admitting: Physical Therapy

## 2018-04-17 DIAGNOSIS — M25661 Stiffness of right knee, not elsewhere classified: Secondary | ICD-10-CM

## 2018-04-17 DIAGNOSIS — R279 Unspecified lack of coordination: Secondary | ICD-10-CM | POA: Diagnosis not present

## 2018-04-17 DIAGNOSIS — M25561 Pain in right knee: Secondary | ICD-10-CM | POA: Diagnosis not present

## 2018-04-17 DIAGNOSIS — M6281 Muscle weakness (generalized): Secondary | ICD-10-CM

## 2018-04-17 DIAGNOSIS — G8929 Other chronic pain: Secondary | ICD-10-CM | POA: Diagnosis not present

## 2018-04-17 NOTE — Therapy (Signed)
[facility] [address] [place], [place], [address] Phone: [phone]   Fax:  [phone]  Physical Therapy Treatment  Patient Details  Name: Darin [name] MRN: [id number] Date of Birth: [birth year] Referring Provider (PT): Dr. Hulan Saas     Progress Note Reporting Period 03/12/18 to 04/17/2018  See note below for Objective Data and Assessment of Progress/Goals.      Encounter Date: 04/17/2018  PT End of Session - 04/17/18 0817    Visit Number  10    Date for PT Re-Evaluation  05/07/18    Authorization Type  Medicare    PT Start Time  0805    PT Stop Time  0845    PT Time Calculation (min)  40 min    Activity Tolerance  Patient tolerated treatment well       Past Medical History:  Diagnosis Date  . Arthritis   . GERD (gastroesophageal reflux disease)   . H/O hiatal hernia    "FOR YEARS" " NO PROBLEM"  . Hyperlipidemia   . Hypertension   . Knee pain   . Neuropathy    PERIPHERAL   . Osteoporosis   . PMR (polymyalgia rheumatica) (HCC)   . Polymyalgia ([place]) 5 YEARS    Past Surgical History:  Procedure Laterality Date  . CARPAL TUNNEL RELEASE    . CATARACT EXTRACTION     right  . COLONOSCOPY  2007  . INGUINAL HERNIA REPAIR  10/14/2011   Procedure: HERNIA REPAIR INGUINAL ADULT;  Surgeon: [name], MD;  Location: [place];  Service: General;  Laterality: Right;  Right Inguinal Hernia Repair with Mesh  . TONSILLECTOMY    . VARICOSE VEIN SURGERY      There were no vitals filed for this visit.  Subjective Assessment - 04/17/18 0806    Subjective  I had trouble getting my support hose on this morning, then my car doors locked with my walker in it.  My knee wants to give way every 20 minutes.  It's happening more often than it was.  Sees Dr. Tamala [name] on Monday morning for a steroid shot.  Continues to use RW on a full time basis.      Patient Stated Goals  I would like to be free of pain and  be able to walk with a cane    Currently in Pain?  Yes    Pain Score  3     Pain Location  Knee    Pain Orientation  Right    Pain Type  Chronic pain         [facility] PT Assessment - 04/17/18 0001      AROM   Right Knee Extension  1    Right Knee Flexion  113   105 supine     Flexibility   Soft Tissue Assessment /Muscle Length  yes    Hamstrings  75 degree right    Quadriceps  0 degrees       Standardized Balance Assessment   10 Meter Walk  19.37                   [facility] Adult PT Treatment/Exercise - 04/17/18 0001      Knee/Hip Exercises: Stretches   Sports administrator Limitations  2nd step rocks 10x right/left with single arm reach overhead      Knee/Hip Exercises: Aerobic   Nustep  seat 13 arms 11 L2 10 min    PT present to discuss  progress- steady pace     Knee/Hip Exercises: Machines for Strengthening   Cybex Leg Press  60# bil 50x; 40# single leg 50x each       Knee/Hip Exercises: Standing   Hip Extension  Stengthening;Right;Left;10 reps    Extension Limitations  red band                PT Short Term Goals - 04/17/18 1144      PT SHORT TERM GOAL #1   Title  be independent in initial HEP      Status  Achieved      PT SHORT TERM GOAL #2   Title  The patient will have improved right knee ROM 3-120 degrees needed for greater ease getting up and down from the chair and in/out of the car    Status  Partially Met      PT SHORT TERM GOAL #3   Title  The patient will have improved hip flexor muscle length to 5 degrees and HS length to 65 degrees needed for improved stride length with ambulation    Status  Achieved      PT SHORT TERM GOAL #4   Title  Timed up and Go to 16 sec indicating improved gait safety    Status  Achieved      PT SHORT TERM GOAL #5   Title  Ten meter walk test to 14 sec indicate improved gait speed needed for community ambulation     Time  4    Period  Weeks    Status  On-going        PT Long Term Goals - 04/14/18 0808       PT LONG TERM GOAL #3   Title  Improved right knee extension strength to 3+/5 and knee flexion strength to 4/5 needed for greater ease rising    Baseline  extension 4/5, flexion 4-/5    Time  8    Period  Weeks    Status  On-going      PT LONG TERM GOAL #4   Title  Improved knee pain with usual ADLs by 40%    Baseline  40% better    Status  Achieved            Plan - 04/17/18 0818    Clinical Impression Statement  The patient has good improvements in right knee extension ROM and slight improvement in knee flexion ROM.  HS length and hip flexor length improved as well.  54mwalk did not improve on one attempt however his gait pattern and speed improves following practice including cues to hold his head up and mental imagery to improve step length.  He does complain of increased knee give way the last couple of days but his overall knee strength is improving.  He is able to increase resistance and repetition with strengthening without exacerbation of knee pain.  Therapist closely monitoring response with all treatment interventions.      Rehab Potential  Good    PT Frequency  2x / week    PT Duration  8 weeks    PT Treatment/Interventions  ADLs/Self Care Home Management;Cryotherapy;Electrical Stimulation;Ultrasound;Moist Heat;Iontophoresis 48mml Dexamethasone;Gait training;Stair training;Therapeutic activities;Therapeutic exercise;Patient/family education;Neuromuscular re-education;Manual techniques;Taping;Dry needling    PT Next Visit Plan   Continue balance, strength and enduarnce    PT Home Exercise Plan  Access Code: [email or web address]      Patient will benefit from skilled therapeutic intervention in order to improve the following deficits  and impairments:  Pain, Decreased range of motion, Decreased strength, Decreased activity tolerance, Impaired perceived functional ability, Difficulty walking, Decreased balance  Visit Diagnosis: Chronic pain of right knee  Stiffness of right  knee, not elsewhere classified  Muscle weakness (generalized)  Unspecified lack of coordination     Problem List Patient Active Problem List   Diagnosis Date Noted  . Right wrist pain 02/05/2018  . Degenerative arthritis of right knee 10/23/2017  . Left leg cellulitis 08/12/2017  . Arthritis of right knee 05/20/2017  . Diverticulosis of colon with hemorrhage   . Acute blood loss anemia   . GIB (gastrointestinal bleeding) 03/12/2017  . Leg edema, right 02/20/2015  . BPH (benign prostatic hyperplasia) 12/09/2014  . Chronic radicular low back pain 08/06/2012  . COPD (chronic obstructive pulmonary disease) ([place]) 04/21/2012  . Syncope 03/29/2012  . Hyponatremia 03/29/2012  . Localized osteoarthrosis not specified whether primary or secondary, lower leg 06/20/2010  . VARICOSE VEINS LOWER EXTREMITIES W/INFLAMMATION 02/12/2010  . [place] LOCALIZED OSTEOARTHROSIS INVOLVING HAND 10/16/2009  . DUPUYTREN'S CONTRACTURE, RIGHT 07/04/2009  . DEGENERATIVE DISC DISEASE, LUMBOSACRAL SPINE W/RADICULOPATHY 06/19/2009  . CARCINOMA, BASAL CELL 02/07/2009  . ANEMIA OF CHRONIC DISEASE 02/07/2009  . BURSITIS, RIGHT SHOULDER 10/07/2008  . CATARACT ASSOCIATED WITH OTHER SYNDROMES 08/01/2008  . DRY SKIN 08/01/2008  . CERUMEN IMPACTION, BILATERAL 03/01/2008  . PMR (polymyalgia rheumatica) ([place]) 01/18/2008  . CRAMP IN LIMB 01/18/2008  . DEPRESSION, SITUATIONAL, ACUTE 12/28/2007  . CONSTIPATION, DRUG INDUCED 12/28/2007  . POLYNEUROPATHY OTHER DISEASES CLASSIFIED ELSW 09/24/2007  . WEIGHT LOSS, ABNORMAL 01/07/2007  . Hyperlipidemia 09/22/2006  . Essential hypertension 09/22/2006  . GERD (gastroesophageal reflux disease) 09/22/2006  . Osteoarthritis 09/22/2006   Ruben Im, PT 04/17/18 11:46 AM Phone: [phone] Fax: [phone]  Alvera Singh 04/17/2018, 11:46 AM  [facility] [address] [place], [place], [address] Phone:  [phone]   Fax:  [phone]  Name: Darin [name] MRN: [id number] Date of Birth: [birth year]

## 2018-04-21 ENCOUNTER — Ambulatory Visit: Payer: Medicare Other | Admitting: Physical Therapy

## 2018-04-21 ENCOUNTER — Encounter: Payer: Self-pay | Admitting: Physical Therapy

## 2018-04-21 DIAGNOSIS — M25561 Pain in right knee: Secondary | ICD-10-CM | POA: Diagnosis not present

## 2018-04-21 DIAGNOSIS — M25661 Stiffness of right knee, not elsewhere classified: Secondary | ICD-10-CM

## 2018-04-21 DIAGNOSIS — M6281 Muscle weakness (generalized): Secondary | ICD-10-CM | POA: Diagnosis not present

## 2018-04-21 DIAGNOSIS — R279 Unspecified lack of coordination: Secondary | ICD-10-CM | POA: Diagnosis not present

## 2018-04-21 DIAGNOSIS — G8929 Other chronic pain: Secondary | ICD-10-CM | POA: Diagnosis not present

## 2018-04-21 NOTE — Therapy (Signed)
[facility] [address] [place], [place], [address] Phone: [phone]   Fax:  [phone]  Physical Therapy Treatment  Patient Details  Name: Darin [name] MRN: [id number] Date of Birth: [birth year] Referring Provider (PT): Dr. Hulan Saas   Encounter Date: 04/21/2018  PT End of Session - 04/21/18 0813    Visit Number  11    Date for PT Re-Evaluation  05/07/18    Authorization Type  Medicare    PT Start Time  0803    PT Stop Time  0843    PT Time Calculation (min)  40 min    Activity Tolerance  Patient tolerated treatment well       Past Medical History:  Diagnosis Date  . Arthritis   . GERD (gastroesophageal reflux disease)   . H/O hiatal hernia    "FOR YEARS" " NO PROBLEM"  . Hyperlipidemia   . Hypertension   . Knee pain   . Neuropathy    PERIPHERAL   . Osteoporosis   . PMR (polymyalgia rheumatica) (HCC)   . Polymyalgia ([place]) 5 YEARS    Past Surgical History:  Procedure Laterality Date  . CARPAL TUNNEL RELEASE    . CATARACT EXTRACTION     right  . COLONOSCOPY  2007  . INGUINAL HERNIA REPAIR  10/14/2011   Procedure: HERNIA REPAIR INGUINAL ADULT;  Surgeon: [name], MD;  Location: [place];  Service: General;  Laterality: Right;  Right Inguinal Hernia Repair with Mesh  . TONSILLECTOMY    . VARICOSE VEIN SURGERY      There were no vitals filed for this visit.  Subjective Assessment - 04/21/18 0806    Subjective  The knee is really bothering me today.  Other than that I'm OK.  Denies an increase in pain following last treatment session.  Sees Dr. Tamala [name] next week.  "I am dependent on the walker and I can't take the rolling walker with me."  "They won't accomodate me."      Limitations  Walking    How long can you walk comfortably?  with RW 10 minutes    Patient Stated Goals  I would like to be free of pain and be able to walk with a cane    Currently in Pain?  Yes    Pain Score  5     Pain Location  Knee    Pain Orientation  Right    Pain Type  Chronic pain                       [facility] Adult PT Treatment/Exercise - 04/21/18 0001      Ambulation/Gait   Pre-Gait Activities  attempted gait with SPC; gait is very shuffled and more unsteady    Gait Comments  gait with cues for head up and increased step length with RW      Knee/Hip Exercises: Stretches   Other Knee/Hip Stretches  standing slant board 10x right/left      Knee/Hip Exercises: Aerobic   Nustep  seat 13 arms 11 L2 10 min    PT present to discuss progress- steady pace     Knee/Hip Exercises: Machines for Strengthening   Cybex Leg Press  65# bil 50x; 40# single leg 50x each       Knee/Hip Exercises: Standing   Hip Abduction  Stengthening;Right;Left;10 reps    Lateral Step Up  Right;Left;10 reps;Hand Hold: 2;Step Height: 2"    Lateral Step  Up Limitations  painful on right     Other Standing Knee Exercises  2 level step taps 10x each leg     Other Standing Knee Exercises  alternating step taps 10x      Knee/Hip Exercises: Seated   Marching Limitations  seated march with red band around thighs 25x    Hamstring Curl  Strengthening;Right;Left;20 reps    Hamstring Limitations  red band               PT Short Term Goals - 04/17/18 1144      PT SHORT TERM GOAL #1   Title  be independent in initial HEP      Status  Achieved      PT SHORT TERM GOAL #2   Title  The patient will have improved right knee ROM 3-120 degrees needed for greater ease getting up and down from the chair and in/out of the car    Status  Partially Met      PT SHORT TERM GOAL #3   Title  The patient will have improved hip flexor muscle length to 5 degrees and HS length to 65 degrees needed for improved stride length with ambulation    Status  Achieved      PT SHORT TERM GOAL #4   Title  Timed up and Go to 16 sec indicating improved gait safety    Status  Achieved      PT SHORT TERM GOAL #5   Title  Ten  meter walk test to 14 sec indicate improved gait speed needed for community ambulation     Time  4    Period  Weeks    Status  On-going        PT Long Term Goals - 04/14/18 0808      PT LONG TERM GOAL #3   Title  Improved right knee extension strength to 3+/5 and knee flexion strength to 4/5 needed for greater ease rising    Baseline  extension 4/5, flexion 4-/5    Time  8    Period  Weeks    Status  On-going      PT LONG TERM GOAL #4   Title  Improved knee pain with usual ADLs by 40%    Baseline  40% better    Status  Achieved            Plan - 04/21/18 0842    Clinical Impression Statement  Patient complains of continued moderate knee pain.  Some exercises modified (lateral step ups) secondary to pain.  Treatment also modified to include more seated ex secondary to reports of dizziness while standing.   Gait practice with RW with cues to hold head up and increased step length.  Attempted gait with a SPC however he becomes much more unsteady and increased shuffling of feet and therefore increase risk of falls.    I do not feel he is safe to ambulate at home or the community with a cane at this time but will continue to practice with therapist in the clinic since this is the patient's goal for his trip to [place].   He denies an increase in pain immediately following treatment session.      Rehab Potential  Good    PT Frequency  2x / week    PT Duration  8 weeks    PT Treatment/Interventions  ADLs/Self Care Home Management;Cryotherapy;Electrical Stimulation;Ultrasound;Moist Heat;Iontophoresis 48m/ml Dexamethasone;Gait training;Stair training;Therapeutic activities;Therapeutic exercise;Patient/family education;Neuromuscular re-education;Manual techniques;Taping;Dry needling  PT Next Visit Plan   Continue balance, strength and endurance; leg press;  gait with cane    PT Home Exercise Plan  Access Code: [email or web address]        Patient will benefit from skilled therapeutic intervention  in order to improve the following deficits and impairments:  Pain, Decreased range of motion, Decreased strength, Decreased activity tolerance, Impaired perceived functional ability, Difficulty walking, Decreased balance  Visit Diagnosis: Chronic pain of right knee  Stiffness of right knee, not elsewhere classified  Muscle weakness (generalized)     Problem List Patient Active Problem List   Diagnosis Date Noted  . Right wrist pain 02/05/2018  . Degenerative arthritis of right knee 10/23/2017  . Left leg cellulitis 08/12/2017  . Arthritis of right knee 05/20/2017  . Diverticulosis of colon with hemorrhage   . Acute blood loss anemia   . GIB (gastrointestinal bleeding) 03/12/2017  . Leg edema, right 02/20/2015  . BPH (benign prostatic hyperplasia) 12/09/2014  . Chronic radicular low back pain 08/06/2012  . COPD (chronic obstructive pulmonary disease) ([place]) 04/21/2012  . Syncope 03/29/2012  . Hyponatremia 03/29/2012  . Localized osteoarthrosis not specified whether primary or secondary, lower leg 06/20/2010  . VARICOSE VEINS LOWER EXTREMITIES W/INFLAMMATION 02/12/2010  . [place] LOCALIZED OSTEOARTHROSIS INVOLVING HAND 10/16/2009  . DUPUYTREN'S CONTRACTURE, RIGHT 07/04/2009  . DEGENERATIVE DISC DISEASE, LUMBOSACRAL SPINE W/RADICULOPATHY 06/19/2009  . CARCINOMA, BASAL CELL 02/07/2009  . ANEMIA OF CHRONIC DISEASE 02/07/2009  . BURSITIS, RIGHT SHOULDER 10/07/2008  . CATARACT ASSOCIATED WITH OTHER SYNDROMES 08/01/2008  . DRY SKIN 08/01/2008  . CERUMEN IMPACTION, BILATERAL 03/01/2008  . PMR (polymyalgia rheumatica) ([place]) 01/18/2008  . CRAMP IN LIMB 01/18/2008  . DEPRESSION, SITUATIONAL, ACUTE 12/28/2007  . CONSTIPATION, DRUG INDUCED 12/28/2007  . POLYNEUROPATHY OTHER DISEASES CLASSIFIED ELSW 09/24/2007  . WEIGHT LOSS, ABNORMAL 01/07/2007  . Hyperlipidemia 09/22/2006  . Essential hypertension 09/22/2006  . GERD (gastroesophageal reflux disease) 09/22/2006  . Osteoarthritis  09/22/2006   Ruben Im, PT 04/21/18 10:02 AM Phone: [phone] Fax: [phone]  Darin [name] 04/21/2018, 10:02 AM  [facility] [address], [place], [place], [address] Phone: [phone]   Fax:  [phone]  Name: Nethaniel Mattie MRN: [id number] Date of Birth: [birth year]

## 2018-04-24 ENCOUNTER — Encounter: Payer: Self-pay | Admitting: Physical Therapy

## 2018-04-24 ENCOUNTER — Ambulatory Visit: Payer: Medicare Other | Admitting: Physical Therapy

## 2018-04-24 DIAGNOSIS — G8929 Other chronic pain: Secondary | ICD-10-CM

## 2018-04-24 DIAGNOSIS — M6281 Muscle weakness (generalized): Secondary | ICD-10-CM | POA: Diagnosis not present

## 2018-04-24 DIAGNOSIS — M25661 Stiffness of right knee, not elsewhere classified: Secondary | ICD-10-CM | POA: Diagnosis not present

## 2018-04-24 DIAGNOSIS — M25561 Pain in right knee: Principal | ICD-10-CM

## 2018-04-24 DIAGNOSIS — R279 Unspecified lack of coordination: Secondary | ICD-10-CM | POA: Diagnosis not present

## 2018-04-24 NOTE — Therapy (Signed)
[facility] [address] [place], [place], [address] Phone: [phone]   Fax:  [phone]  Physical Therapy Treatment  Patient Details  Name: Darin [name] MRN: [id number] Date of Birth: Aug 03, 1931 Referring Provider (PT): Dr. Hulan Saas   Encounter Date: 04/24/2018  PT End of Session - 04/24/18 0805    Visit Number  12    Date for PT Re-Evaluation  05/07/18    Authorization Type  Medicare    PT Start Time  0801    PT Stop Time  0840    PT Time Calculation (min)  39 min    Activity Tolerance  Patient tolerated treatment well       Past Medical History:  Diagnosis Date  . Arthritis   . GERD (gastroesophageal reflux disease)   . H/O hiatal hernia    "FOR YEARS" " NO PROBLEM"  . Hyperlipidemia   . Hypertension   . Knee pain   . Neuropathy    PERIPHERAL   . Osteoporosis   . PMR (polymyalgia rheumatica) (HCC)   . Polymyalgia ([place]) 5 YEARS    Past Surgical History:  Procedure Laterality Date  . CARPAL TUNNEL RELEASE    . CATARACT EXTRACTION     right  . COLONOSCOPY  2007  . INGUINAL HERNIA REPAIR  10/14/2011   Procedure: HERNIA REPAIR INGUINAL ADULT;  Surgeon: [name], MD;  Location: [place];  Service: General;  Laterality: Right;  Right Inguinal Hernia Repair with Mesh  . TONSILLECTOMY    . VARICOSE VEIN SURGERY      There were no vitals filed for this visit.  Subjective Assessment - 04/24/18 0801    Subjective  Patient arrives with [facility] today.  States he used the cane to go the barbershop yesterday.  Knee is not all that hot.  It's been like that all week.  A little bit of buckling.  I've added to my trip plans.  [place] in February, a cruise on the [place] and a trip to [place] this summer.      Currently in Pain?  Yes    Pain Score  5     Pain Location  Knee    Pain Orientation  Right    Pain Type  Chronic pain                       [facility] Adult PT  Treatment/Exercise - 04/24/18 0001      Knee/Hip Exercises: Stretches   Active Hamstring Stretch Limitations  HS stretch on step 5x     Quad Stretch Limitations  2nd step rocks 10x right/left with single arm reach overhead 5x right/left      Knee/Hip Exercises: Aerobic   Nustep  seat 13 arms 11 L2 10 min    PT present to discuss progress- steady pace     Knee/Hip Exercises: Machines for Strengthening   Cybex Knee Extension  10# double leg 20x    Cybex Knee Flexion  20# double and single leg 20x each     Cybex Leg Press  65# bil 50x; 40# single leg 50x each       Knee/Hip Exercises: Standing   Heel Raises  Both;20 reps    Abduction Limitations  stepping to the side     Extension Limitations  stepping to the back alternating     Other Standing Knee Exercises  stepping over and back across the line15x     Other Standing  Knee Exercises  alternating step taps 10x      Knee/Hip Exercises: Seated   Marching  AROM;Right;Left;15 reps               PT Short Term Goals - 04/17/18 1144      PT SHORT TERM GOAL #1   Title  be independent in initial HEP      Status  Achieved      PT SHORT TERM GOAL #2   Title  The patient will have improved right knee ROM 3-120 degrees needed for greater ease getting up and down from the chair and in/out of the car    Status  Partially Met      PT SHORT TERM GOAL #3   Title  The patient will have improved hip flexor muscle length to 5 degrees and HS length to 65 degrees needed for improved stride length with ambulation    Status  Achieved      PT SHORT TERM GOAL #4   Title  Timed up and Go to 16 sec indicating improved gait safety    Status  Achieved      PT SHORT TERM GOAL #5   Title  Ten meter walk test to 14 sec indicate improved gait speed needed for community ambulation     Time  4    Period  Weeks    Status  On-going        PT Long Term Goals - 04/14/18 0808      PT LONG TERM GOAL #3   Title  Improved right knee extension  strength to 3+/5 and knee flexion strength to 4/5 needed for greater ease rising    Baseline  extension 4/5, flexion 4-/5    Time  8    Period  Weeks    Status  On-going      PT LONG TERM GOAL #4   Title  Improved knee pain with usual ADLs by 40%    Baseline  40% better    Status  Achieved            Plan - 04/24/18 0820    Clinical Impression Statement  The patient presents with a walking pole instead of his rolling walker.  He has difficulty with gait initiation  and tends to stutter step in place at first.  Much wider base of support and decreased gait speed to accomodate for less support.  He is able to continue with LE strengthening in more open chain rather than closed chain.  Therapist closely monitoring pain and providing closer contact guard assist for safety secondary to increased unsteadiness with the cane.      Rehab Potential  Good    Clinical Impairments Affecting Rehab Potential  risk for falls    PT Frequency  2x / week    PT Duration  8 weeks    PT Treatment/Interventions  ADLs/Self Care Home Management;Cryotherapy;Electrical Stimulation;Ultrasound;Moist Heat;Iontophoresis 89m/ml Dexamethasone;Gait training;Stair training;Therapeutic activities;Therapeutic exercise;Patient/family education;Neuromuscular re-education;Manual techniques;Taping;Dry needling    PT Next Visit Plan   Continue balance, strength and endurance; leg press;  gait with cane    PT Home Exercise Plan  Access Code: [email or web address]        Patient will benefit from skilled therapeutic intervention in order to improve the following deficits and impairments:  Pain, Decreased range of motion, Decreased strength, Decreased activity tolerance, Impaired perceived functional ability, Difficulty walking, Decreased balance  Visit Diagnosis: Chronic pain of right knee  Stiffness of right knee, not elsewhere  classified  Muscle weakness (generalized)     Problem List Patient Active Problem List   Diagnosis  Date Noted  . Right wrist pain 02/05/2018  . Degenerative arthritis of right knee 10/23/2017  . Left leg cellulitis 08/12/2017  . Arthritis of right knee 05/20/2017  . Diverticulosis of colon with hemorrhage   . Acute blood loss anemia   . GIB (gastrointestinal bleeding) 03/12/2017  . Leg edema, right 02/20/2015  . BPH (benign prostatic hyperplasia) 12/09/2014  . Chronic radicular low back pain 08/06/2012  . COPD (chronic obstructive pulmonary disease) ([place]) 04/21/2012  . Syncope 03/29/2012  . Hyponatremia 03/29/2012  . Localized osteoarthrosis not specified whether primary or secondary, lower leg 06/20/2010  . VARICOSE VEINS LOWER EXTREMITIES W/INFLAMMATION 02/12/2010  . [place] LOCALIZED OSTEOARTHROSIS INVOLVING HAND 10/16/2009  . DUPUYTREN'S CONTRACTURE, RIGHT 07/04/2009  . DEGENERATIVE DISC DISEASE, LUMBOSACRAL SPINE W/RADICULOPATHY 06/19/2009  . CARCINOMA, BASAL CELL 02/07/2009  . ANEMIA OF CHRONIC DISEASE 02/07/2009  . BURSITIS, RIGHT SHOULDER 10/07/2008  . CATARACT ASSOCIATED WITH OTHER SYNDROMES 08/01/2008  . DRY SKIN 08/01/2008  . CERUMEN IMPACTION, BILATERAL 03/01/2008  . PMR (polymyalgia rheumatica) ([place]) 01/18/2008  . CRAMP IN LIMB 01/18/2008  . DEPRESSION, SITUATIONAL, ACUTE 12/28/2007  . CONSTIPATION, DRUG INDUCED 12/28/2007  . POLYNEUROPATHY OTHER DISEASES CLASSIFIED ELSW 09/24/2007  . WEIGHT LOSS, ABNORMAL 01/07/2007  . Hyperlipidemia 09/22/2006  . Essential hypertension 09/22/2006  . GERD (gastroesophageal reflux disease) 09/22/2006  . Osteoarthritis 09/22/2006   Ruben Im, PT 04/24/18 4:35 PM Phone: [phone] Fax: [phone]  Alvera Singh 04/24/2018, 4:35 PM  [facility] [address] [place], [place], [address] Phone: [phone]   Fax:  [phone]  Name: Darin [name] MRN: [id number] Date of Birth: 02/21/1932

## 2018-04-25 NOTE — Progress Notes (Signed)
[name] [address] [place], [place] [address] Phone: [phone] Subjective:   [name], am serving as a scribe for Dr. Hulan Saas.  CC: Right knee pain follow-up  [id number]  Darin [name] is a 85 y.o. male coming in with complaint of right knee pain.  Known arthritic changes of the knee.  Has been doing well with formal physical therapy.  Patient has been working on balance and coordination.  Last visit in December patient was given PRP injection. Patient feels that his right knee is unstable but is not painful. Feels that PRP injection helped his pain. Patient is having left lower back pain which is making him reliant on his walker.      Past Medical History:  Diagnosis Date  . Arthritis   . GERD (gastroesophageal reflux disease)   . H/O hiatal hernia    "FOR YEARS" " NO PROBLEM"  . Hyperlipidemia   . Hypertension   . Knee pain   . Neuropathy    PERIPHERAL   . Osteoporosis   . PMR (polymyalgia rheumatica) (HCC)   . Polymyalgia ([place]) 5 YEARS   Past Surgical History:  Procedure Laterality Date  . CARPAL TUNNEL RELEASE    . CATARACT EXTRACTION     right  . COLONOSCOPY  2007  . INGUINAL HERNIA REPAIR  10/14/2011   Procedure: HERNIA REPAIR INGUINAL ADULT;  Surgeon: [name], MD;  Location: [place];  Service: General;  Laterality: Right;  Right Inguinal Hernia Repair with Mesh  . TONSILLECTOMY    . VARICOSE VEIN SURGERY     Social History   Socioeconomic History  . Marital status: Widowed    Spouse name: Not on file  . Number of children: Not on file  . Years of education: Not on file  . Highest education level: Not on file  Occupational History  . Occupation: retired    Fish farm manager: RETIRED  Social Needs  . Financial resource strain: Not on file  . Food insecurity:    Worry: Not on file    Inability: Not on file  . Transportation needs:    Medical: Not on file    Non-medical: Not on file  Tobacco Use  .  Smoking status: Never Smoker  . Smokeless tobacco: Never Used  Substance and Sexual Activity  . Alcohol use: Yes    Alcohol/week: 3.0 standard drinks    Types: 3 Glasses of wine per week    Comment: half glass wine before dinner  . Drug use: No  . Sexual activity: Yes  Lifestyle  . Physical activity:    Days per week: Not on file    Minutes per session: Not on file  . Stress: Not on file  Relationships  . Social connections:    Talks on phone: Not on file    Gets together: Not on file    Attends religious service: Not on file    Active member of club or organization: Not on file    Attends meetings of clubs or organizations: Not on file    Relationship status: Not on file  Other Topics Concern  . Not on file  Social History Narrative  . Not on file   Allergies  Allergen Reactions  . Statins Other (See Comments)    Muscle pain and upset stomach   Family History  Problem Relation Age of Onset  . Heart disease Father   . Coronary artery disease Unknown  Current Outpatient Medications (Cardiovascular):  .  amLODipine (NORVASC) 5 MG tablet, TAKE 1 TABLET DAILY .  benazepril-hydrochlorthiazide (LOTENSIN HCT) 10-12.5 MG tablet, TAKE 1 TABLET DAILY        *PATIENT NEEDS AN          APPOINTMENT*   Current Outpatient Medications (Analgesics):  .  aspirin 81 MG tablet, Take 81 mg by mouth daily.    Current Outpatient Medications (Other):  .  famotidine (PEPCID) 20 MG tablet, TAKE 1 TABLET AT BEDTIME .  finasteride (PROSCAR) 5 MG tablet, TAKE 1 TABLET EACH DAY. Marland Kitchen  gabapentin (NEURONTIN) 100 MG capsule, TAKE 1 CAPSULE 4 TIMES     DAILY .  niacin 500 MG tablet, Take 500 mg by mouth 2 (two) times daily with a meal.  .  polyethylene glycol (MIRALAX / GLYCOLAX) packet, Take 17 g by mouth daily as needed for moderate constipation. .  triamcinolone ointment (KENALOG) 0.1 %, Apply 1 application topically 2 (two) times daily. To affected area .  vitamin C (ASCORBIC ACID) 500 MG  tablet, Take 500 mg by mouth daily.    Past medical history, social, surgical and family history all reviewed in electronic medical record.  No pertanent information unless stated regarding to the chief complaint.   Review of Systems:  No headache, visual changes, nausea, vomiting, diarrhea, constipation, dizziness, abdominal pain, skin rash, fevers, chills, night sweats, weight loss, swollen lymph nodes, body aches, joint swelling, chest pain, shortness of breath, mood changes. Positive muscle aches   Objective  Blood pressure 118/60, pulse 85, height 6\' 1"  (1.854 m), weight 197 lb (89.4 kg), SpO2 99 %.    General: No apparent distress alert and oriented x3 mood and affect normal, dressed appropriately.  HEENT: Pupils equal, extraocular movements intact  Respiratory: Patient's speak in full sentences and does not appear short of breath  Cardiovascular: No lower extremity edema, non tender, no erythema  Skin: Warm dry intact with no signs of infection or rash on extremities or on axial skeleton.  Abdomen: Soft nontender  Neuro: Cranial nerves II through XII are intact, neurovascularly intact in all extremities with 2+ DTRs and 2+ pulses.  Lymph: No lymphadenopathy of posterior or anterior cervical chain or axillae bilaterally.   Gait antalgic gait due to the aid of a walker MSK:  tender with limited range of motion and arthritic changes  Knee: Right valgus deformity noted.  Abnormal thigh to calf ratio.  Significant decrease swelling from previous exam Tender to palpation over medial and PF joint line.  Limited range of motion in all planes. instability with valgus force.  painful patellar compression. Patellar glide with moderate crepitus. Patellar and quadriceps tendons unremarkable. Hamstring and quadriceps strength is normal. Contralateral knee shows mild arthritic changes as well  Back exam  Loss of lordosis.  Severe tenderness to palpation over the left sacroiliac joint.   Unable to do [place] secondary to pain and tightness.  Negative straight leg test.  Strength of the lower extremities 4-5  Verbal consent patient was prepped with alcohol swab patient was then injected with a 21-gauge 2 inch needle into the left sacroiliac joint with a total of 1 cc of 0.5% [place] and 1 cc of Kenalog 40 mg/mL.  No blood loss.  Band-Aid placed.  Postinjection instructions given    Impression and Recommendations:     This case required medical decision making of moderate complexity. The above documentation has been reviewed and is accurate and complete Lyndal Pulley, DO  Note: This dictation was prepared with Dragon dictation along with smaller phrase technology. Any transcriptional errors that result from this process are unintentional.

## 2018-04-27 ENCOUNTER — Ambulatory Visit (INDEPENDENT_AMBULATORY_CARE_PROVIDER_SITE_OTHER): Payer: Medicare Other | Admitting: Family Medicine

## 2018-04-27 DIAGNOSIS — M1711 Unilateral primary osteoarthritis, right knee: Secondary | ICD-10-CM | POA: Diagnosis not present

## 2018-04-27 DIAGNOSIS — G8929 Other chronic pain: Secondary | ICD-10-CM | POA: Diagnosis not present

## 2018-04-27 DIAGNOSIS — M353 Polymyalgia rheumatica: Secondary | ICD-10-CM

## 2018-04-27 DIAGNOSIS — M533 Sacrococcygeal disorders, not elsewhere classified: Secondary | ICD-10-CM | POA: Diagnosis not present

## 2018-04-27 NOTE — Patient Instructions (Signed)
Good to see you  Ice is your friend Tried SI injection today on the left side  For the knee have a scheduled for 2 weeks

## 2018-04-27 NOTE — Assessment & Plan Note (Signed)
Likely contributing.  May need prednsione for the trip  RTC in 2 weeks

## 2018-04-27 NOTE — Assessment & Plan Note (Signed)
Patient given injection today. Patient tolerated the procedure well.  I believe that patient is loading the sacroiliac joint secondary to compensation for the knee.  New knee brace given and patient seems to be improving with the discomfort.  Encourage patient continue with formal physical therapy.  Patient will see me again right before his trip and hopefully will make any changes so he can enjoy.

## 2018-04-27 NOTE — Assessment & Plan Note (Signed)
Degenerative arthritis.  Discussed icing regimen and home exercise.  Which activities to do which wants to avoid.  Hold on any other injection at this point no swelling noted.  Different brace given.  Follow-up again in 2 weeks.  If needed do an injection before her trip

## 2018-04-28 ENCOUNTER — Ambulatory Visit: Payer: Medicare Other | Attending: Family Medicine | Admitting: Physical Therapy

## 2018-04-28 DIAGNOSIS — R279 Unspecified lack of coordination: Secondary | ICD-10-CM | POA: Insufficient documentation

## 2018-04-28 DIAGNOSIS — R296 Repeated falls: Secondary | ICD-10-CM | POA: Insufficient documentation

## 2018-04-28 DIAGNOSIS — G8929 Other chronic pain: Secondary | ICD-10-CM | POA: Insufficient documentation

## 2018-04-28 DIAGNOSIS — M25661 Stiffness of right knee, not elsewhere classified: Secondary | ICD-10-CM | POA: Insufficient documentation

## 2018-04-28 DIAGNOSIS — M6281 Muscle weakness (generalized): Secondary | ICD-10-CM | POA: Insufficient documentation

## 2018-04-28 DIAGNOSIS — M25561 Pain in right knee: Secondary | ICD-10-CM | POA: Diagnosis not present

## 2018-04-28 NOTE — Therapy (Signed)
[facility] [address], [place], [place], [address] Phone: [phone]   Fax:  [phone]  Physical Therapy Treatment  Patient Details  Name: Darin [name] MRN: [id number] Date of Birth: Dec 16, 1931 Referring Provider (PT): Dr. Hulan Saas   Encounter Date: 04/28/2018  PT End of Session - 04/28/18 0819    Visit Number  13    Date for PT Re-Evaluation  05/07/18    Authorization Type  Medicare    PT Start Time  0801    PT Stop Time  0845    PT Time Calculation (min)  44 min    Activity Tolerance  Patient tolerated treatment well       Past Medical History:  Diagnosis Date  . Arthritis   . GERD (gastroesophageal reflux disease)   . H/O hiatal hernia    "FOR YEARS" " NO PROBLEM"  . Hyperlipidemia   . Hypertension   . Knee pain   . Neuropathy    PERIPHERAL   . Osteoporosis   . PMR (polymyalgia rheumatica) (HCC)   . Polymyalgia ([place]) 5 YEARS    Past Surgical History:  Procedure Laterality Date  . CARPAL TUNNEL RELEASE    . CATARACT EXTRACTION     right  . COLONOSCOPY  2007  . INGUINAL HERNIA REPAIR  10/14/2011   Procedure: HERNIA REPAIR INGUINAL ADULT;  Surgeon: [name], MD;  Location: [place];  Service: General;  Laterality: Right;  Right Inguinal Hernia Repair with Mesh  . TONSILLECTOMY    . VARICOSE VEIN SURGERY      There were no vitals filed for this visit.  Subjective Assessment - 04/28/18 0813    Subjective  Sore from hip injection yesterday.  Got a new knee brace which he is wearing today.  Using a RW today.      Limitations  Walking    Currently in Pain?  Yes    Pain Score  5     Pain Location  Hip                       [facility] Adult PT Treatment/Exercise - 04/28/18 0001      Knee/Hip Exercises: Stretches   Active Hamstring Stretch Limitations  HS stretch on step 5x     Quad Stretch Limitations  2nd step rocks 10x right/left with single arm reach overhead 5x  right/left    Other Knee/Hip Stretches  standing slant board 10x right/left      Knee/Hip Exercises: Aerobic   Nustep  seat 13 arms 11 L3/4 10 min    PT present to discuss progress- steady pace     Knee/Hip Exercises: Machines for Strengthening   Cybex Knee Extension  10# double leg 20x    Cybex Knee Flexion  25# bil 20x; 20# single leg 20x    Cybex Leg Press  70# bil 50x; 40# single leg 50x each       Knee/Hip Exercises: Standing   Heel Raises  Both;20 reps    Other Standing Knee Exercises  forward taps to BOSU 20x    Other Standing Knee Exercises  side taps to BOSU 20x right/left                PT Short Term Goals - 04/17/18 1144      PT SHORT TERM GOAL #1   Title  be independent in initial HEP      Status  Achieved  PT SHORT TERM GOAL #2   Title  The patient will have improved right knee ROM 3-120 degrees needed for greater ease getting up and down from the chair and in/out of the car    Status  Partially Met      PT SHORT TERM GOAL #3   Title  The patient will have improved hip flexor muscle length to 5 degrees and HS length to 65 degrees needed for improved stride length with ambulation    Status  Achieved      PT SHORT TERM GOAL #4   Title  Timed up and Go to 16 sec indicating improved gait safety    Status  Achieved      PT SHORT TERM GOAL #5   Title  Ten meter walk test to 14 sec indicate improved gait speed needed for community ambulation     Time  4    Period  Weeks    Status  On-going        PT Long Term Goals - 04/14/18 0808      PT LONG TERM GOAL #3   Title  Improved right knee extension strength to 3+/5 and knee flexion strength to 4/5 needed for greater ease rising    Baseline  extension 4/5, flexion 4-/5    Time  8    Period  Weeks    Status  On-going      PT LONG TERM GOAL #4   Title  Improved knee pain with usual ADLs by 40%    Baseline  40% better    Status  Achieved            Plan - 04/28/18 0828    Clinical  Impression Statement  The patient's primary complaint today is hip soreness following an injection yesterday.  He needs a few sitting rest breaks secondary to dizziness particularly with standing.  Despite soreness, he is still able to progress the resistance and intensity with strengthening exercises.  Therapist closely monitoring pain level and fatiigue and modifying as needed.      Rehab Potential  Good    Clinical Impairments Affecting Rehab Potential  risk for falls    PT Frequency  2x / week    PT Duration  8 weeks    PT Treatment/Interventions  ADLs/Self Care Home Management;Cryotherapy;Electrical Stimulation;Ultrasound;Moist Heat;Iontophoresis 16m/ml Dexamethasone;Gait training;Stair training;Therapeutic activities;Therapeutic exercise;Patient/family education;Neuromuscular re-education;Manual techniques;Taping;Dry needling    PT Next Visit Plan   Continue balance, strength and endurance; leg press;  gait with cane    PT Home Exercise Plan  Access Code: [email or web address]        Patient will benefit from skilled therapeutic intervention in order to improve the following deficits and impairments:  Pain, Decreased range of motion, Decreased strength, Decreased activity tolerance, Impaired perceived functional ability, Difficulty walking, Decreased balance  Visit Diagnosis: Chronic pain of right knee  Stiffness of right knee, not elsewhere classified  Muscle weakness (generalized)     Problem List Patient Active Problem List   Diagnosis Date Noted  . Chronic left SI joint pain 04/27/2018  . Right wrist pain 02/05/2018  . Degenerative arthritis of right knee 10/23/2017  . Left leg cellulitis 08/12/2017  . Arthritis of right knee 05/20/2017  . Diverticulosis of colon with hemorrhage   . Acute blood loss anemia   . GIB (gastrointestinal bleeding) 03/12/2017  . Leg edema, right 02/20/2015  . BPH (benign prostatic hyperplasia) 12/09/2014  . Chronic radicular low back pain 08/06/2012  .  COPD (  chronic obstructive pulmonary disease) ([place]) 04/21/2012  . Syncope 03/29/2012  . Hyponatremia 03/29/2012  . Localized osteoarthrosis not specified whether primary or secondary, lower leg 06/20/2010  . VARICOSE VEINS LOWER EXTREMITIES W/INFLAMMATION 02/12/2010  . [place] LOCALIZED OSTEOARTHROSIS INVOLVING HAND 10/16/2009  . DUPUYTREN'S CONTRACTURE, RIGHT 07/04/2009  . DEGENERATIVE DISC DISEASE, LUMBOSACRAL SPINE W/RADICULOPATHY 06/19/2009  . CARCINOMA, BASAL CELL 02/07/2009  . ANEMIA OF CHRONIC DISEASE 02/07/2009  . BURSITIS, RIGHT SHOULDER 10/07/2008  . CATARACT ASSOCIATED WITH OTHER SYNDROMES 08/01/2008  . DRY SKIN 08/01/2008  . CERUMEN IMPACTION, BILATERAL 03/01/2008  . PMR (polymyalgia rheumatica) ([place]) 01/18/2008  . CRAMP IN LIMB 01/18/2008  . DEPRESSION, SITUATIONAL, ACUTE 12/28/2007  . CONSTIPATION, DRUG INDUCED 12/28/2007  . POLYNEUROPATHY OTHER DISEASES CLASSIFIED ELSW 09/24/2007  . WEIGHT LOSS, ABNORMAL 01/07/2007  . Hyperlipidemia 09/22/2006  . Essential hypertension 09/22/2006  . GERD (gastroesophageal reflux disease) 09/22/2006  . Osteoarthritis 09/22/2006   Ruben Im, PT 04/28/18 8:35 AM Phone: [phone] Fax: [phone]  Alvera Singh 04/28/2018, 8:35 AM  [facility] [address] [place], [place], [address] Phone: [phone]   Fax:  [phone]  Name: Darin [name] MRN: [id number] Date of Birth: Jun 23, 1931

## 2018-05-01 ENCOUNTER — Encounter: Payer: Self-pay | Admitting: Physical Therapy

## 2018-05-01 ENCOUNTER — Ambulatory Visit: Payer: Medicare Other | Admitting: Physical Therapy

## 2018-05-01 DIAGNOSIS — R279 Unspecified lack of coordination: Secondary | ICD-10-CM | POA: Diagnosis not present

## 2018-05-01 DIAGNOSIS — R296 Repeated falls: Secondary | ICD-10-CM | POA: Diagnosis not present

## 2018-05-01 DIAGNOSIS — G8929 Other chronic pain: Secondary | ICD-10-CM

## 2018-05-01 DIAGNOSIS — M25661 Stiffness of right knee, not elsewhere classified: Secondary | ICD-10-CM | POA: Diagnosis not present

## 2018-05-01 DIAGNOSIS — M25561 Pain in right knee: Secondary | ICD-10-CM | POA: Diagnosis not present

## 2018-05-01 DIAGNOSIS — M6281 Muscle weakness (generalized): Secondary | ICD-10-CM | POA: Diagnosis not present

## 2018-05-01 NOTE — Therapy (Signed)
[facility] [address] [place], [place], [address] Phone: [phone]   Fax:  [phone]  Physical Therapy Treatment  Patient Details  Name: Darin [name] MRN: [id number] Date of Birth: 12-27-1931 Referring Provider (PT): Dr. Hulan Saas   Encounter Date: 05/01/2018  PT End of Session - 05/01/18 0757    Visit Number  14    Date for PT Re-Evaluation  05/07/18    Authorization Type  Medicare    PT Start Time  5027    PT Stop Time  0840    PT Time Calculation (min)  43 min    Activity Tolerance  Patient tolerated treatment well       Past Medical History:  Diagnosis Date  . Arthritis   . GERD (gastroesophageal reflux disease)   . H/O hiatal hernia    "FOR YEARS" " NO PROBLEM"  . Hyperlipidemia   . Hypertension   . Knee pain   . Neuropathy    PERIPHERAL   . Osteoporosis   . PMR (polymyalgia rheumatica) (HCC)   . Polymyalgia ([place]) 5 YEARS    Past Surgical History:  Procedure Laterality Date  . CARPAL TUNNEL RELEASE    . CATARACT EXTRACTION     right  . COLONOSCOPY  2007  . INGUINAL HERNIA REPAIR  10/14/2011   Procedure: HERNIA REPAIR INGUINAL ADULT;  Surgeon: [name], MD;  Location: [place];  Service: General;  Laterality: Right;  Right Inguinal Hernia Repair with Mesh  . TONSILLECTOMY    . VARICOSE VEIN SURGERY      There were no vitals filed for this visit.  Subjective Assessment - 05/01/18 0757    Subjective  Presents with walking pole.  As he walks in he reports an episode of knee give way.  He is wearing his knee brace but states it may hinder more than helps.  Stuttering gait pattern with initiation of gait.  The knee is fine but my back has hurt like hell the last 2 days.  Leaves on the 20th for [place].     Currently in Pain?  Yes    Pain Score  5     Pain Location  Back    Pain Type  Chronic pain    Aggravating Factors   when knee gives way, turning sideways                        [facility] Adult PT Treatment/Exercise - 05/01/18 0001      Knee/Hip Exercises: Stretches   Active Hamstring Stretch Limitations  HS stretch on step 5x     Quad Stretch Limitations  2nd step rocks 10x right/left with single arm reach overhead 5x right/left      Knee/Hip Exercises: Aerobic   Nustep  seat 13 arms 11 L3 10 min    PT present to discuss progress- steady pace     Knee/Hip Exercises: Machines for Strengthening   Cybex Knee Extension  10# double leg 50x    Cybex Knee Flexion  25# bil 50x; 20# single leg 50x    Cybex Leg Press  75# bil 50x; 40# single leg 50x each       Knee/Hip Exercises: Standing   Heel Raises  Both;20 reps    Gait Training  UE side reaches single/double     Other Standing Knee Exercises  ladder walk with cane/pole    Other Standing Knee Exercises  tapping over line forward 3x10  PT Short Term Goals - 04/17/18 1144      PT SHORT TERM GOAL #1   Title  be independent in initial HEP      Status  Achieved      PT SHORT TERM GOAL #2   Title  The patient will have improved right knee ROM 3-120 degrees needed for greater ease getting up and down from the chair and in/out of the car    Status  Partially Met      PT SHORT TERM GOAL #3   Title  The patient will have improved hip flexor muscle length to 5 degrees and HS length to 65 degrees needed for improved stride length with ambulation    Status  Achieved      PT SHORT TERM GOAL #4   Title  Timed up and Go to 16 sec indicating improved gait safety    Status  Achieved      PT SHORT TERM GOAL #5   Title  Ten meter walk test to 14 sec indicate improved gait speed needed for community ambulation     Time  4    Period  Weeks    Status  On-going        PT Long Term Goals - 04/14/18 0808      PT LONG TERM GOAL #3   Title  Improved right knee extension strength to 3+/5 and knee flexion strength to 4/5 needed for greater ease rising    Baseline   extension 4/5, flexion 4-/5    Time  8    Period  Weeks    Status  On-going      PT LONG TERM GOAL #4   Title  Improved knee pain with usual ADLs by 40%    Baseline  40% better    Status  Achieved            Plan - 05/01/18 4742    Clinical Impression Statement  Increased difficulty with initiation of gait with numerous stuttering type steps.  Gait fluidity much improved with floor ladder although his wider base of support (than with the walker) affects his ability to fit both feet in the box at once.  He fatigues quickly with standing 5 minutes and needs 3 sitting rest breaks during treatment session.  Therapist closely monitoring response and providing CGA for safety at times.      Rehab Potential  Good    Clinical Impairments Affecting Rehab Potential  risk for falls    PT Frequency  2x / week    PT Duration  8 weeks    PT Treatment/Interventions  ADLs/Self Care Home Management;Cryotherapy;Electrical Stimulation;Ultrasound;Moist Heat;Iontophoresis 40m/ml Dexamethasone;Gait training;Stair training;Therapeutic activities;Therapeutic exercise;Patient/family education;Neuromuscular re-education;Manual techniques;Taping;Dry needling    PT Next Visit Plan   Continue balance, strength and endurance; leg press;  gait with cane;  practice steps;  ERO in 1 week;  continue to prepare [place]   PT Home Exercise Plan  [facility]       Patient will benefit from skilled therapeutic intervention in order to improve the following deficits and impairments:  Pain, Decreased range of motion, Decreased strength, Decreased activity tolerance, Impaired perceived functional ability, Difficulty walking, Decreased balance  Visit Diagnosis: Chronic pain of right knee  Stiffness of right knee, not elsewhere classified  Muscle weakness (generalized)     Problem List Patient Active Problem List   Diagnosis Date Noted  . Chronic left SI joint pain 04/27/2018  .  Right  wrist pain 02/05/2018  . Degenerative arthritis of right knee 10/23/2017  . Left leg cellulitis 08/12/2017  . Arthritis of right knee 05/20/2017  . Diverticulosis of colon with hemorrhage   . Acute blood loss anemia   . GIB (gastrointestinal bleeding) 03/12/2017  . Leg edema, right 02/20/2015  . BPH (benign prostatic hyperplasia) 12/09/2014  . Chronic radicular low back pain 08/06/2012  . COPD (chronic obstructive pulmonary disease) ([place]) 04/21/2012  . Syncope 03/29/2012  . Hyponatremia 03/29/2012  . Localized osteoarthrosis not specified whether primary or secondary, lower leg 06/20/2010  . VARICOSE VEINS LOWER EXTREMITIES W/INFLAMMATION 02/12/2010  . [place] LOCALIZED OSTEOARTHROSIS INVOLVING HAND 10/16/2009  . DUPUYTREN'S CONTRACTURE, RIGHT 07/04/2009  . DEGENERATIVE DISC DISEASE, LUMBOSACRAL SPINE W/RADICULOPATHY 06/19/2009  . CARCINOMA, BASAL CELL 02/07/2009  . ANEMIA OF CHRONIC DISEASE 02/07/2009  . BURSITIS, RIGHT SHOULDER 10/07/2008  . CATARACT ASSOCIATED WITH OTHER SYNDROMES 08/01/2008  . DRY SKIN 08/01/2008  . CERUMEN IMPACTION, BILATERAL 03/01/2008  . PMR (polymyalgia rheumatica) ([place]) 01/18/2008  . CRAMP IN LIMB 01/18/2008  . DEPRESSION, SITUATIONAL, ACUTE 12/28/2007  . CONSTIPATION, DRUG INDUCED 12/28/2007  . POLYNEUROPATHY OTHER DISEASES CLASSIFIED ELSW 09/24/2007  . WEIGHT LOSS, ABNORMAL 01/07/2007  . Hyperlipidemia 09/22/2006  . Essential hypertension 09/22/2006  . GERD (gastroesophageal reflux disease) 09/22/2006  . Osteoarthritis 09/22/2006   Ruben Im, PT 05/01/18 8:38 AM Phone: [phone] Fax: [phone]  Alvera Singh 05/01/2018, 8:38 AM  [facility] [address], [place], [place], [address] Phone: [phone]   Fax:  [phone]  Name: Darin [name] MRN: [id number] Date of Birth: [birth year]

## 2018-05-04 ENCOUNTER — Other Ambulatory Visit: Payer: Self-pay | Admitting: Family Medicine

## 2018-05-05 ENCOUNTER — Ambulatory Visit: Payer: Medicare Other | Admitting: Physical Therapy

## 2018-05-05 ENCOUNTER — Encounter: Payer: Self-pay | Admitting: Physical Therapy

## 2018-05-05 DIAGNOSIS — M25661 Stiffness of right knee, not elsewhere classified: Secondary | ICD-10-CM | POA: Diagnosis not present

## 2018-05-05 DIAGNOSIS — G8929 Other chronic pain: Secondary | ICD-10-CM | POA: Diagnosis not present

## 2018-05-05 DIAGNOSIS — M25561 Pain in right knee: Secondary | ICD-10-CM | POA: Diagnosis not present

## 2018-05-05 DIAGNOSIS — M6281 Muscle weakness (generalized): Secondary | ICD-10-CM | POA: Diagnosis not present

## 2018-05-05 DIAGNOSIS — R279 Unspecified lack of coordination: Secondary | ICD-10-CM | POA: Diagnosis not present

## 2018-05-05 DIAGNOSIS — R296 Repeated falls: Secondary | ICD-10-CM | POA: Diagnosis not present

## 2018-05-05 NOTE — Therapy (Signed)
[facility] [address] [place], [place], [address] Phone: [phone]   Fax:  [phone]  Physical Therapy Treatment/Recertification   Patient Details  Name: Darin [name] MRN: [id number] Date of Birth: 1931/11/26 Referring Provider (PT): Dr. Hulan Saas    Encounter Date: 05/05/2018  PT End of Session - 05/05/18 0835    Visit Number  15    Date for PT Re-Evaluation  06/30/18    Authorization Type  Medicare    PT Start Time  0803    PT Stop Time  0355    PT Time Calculation (min)  41 min    Activity Tolerance  Patient tolerated treatment well       Past Medical History:  Diagnosis Date  . Arthritis   . GERD (gastroesophageal reflux disease)   . H/O hiatal hernia    "FOR YEARS" " NO PROBLEM"  . Hyperlipidemia   . Hypertension   . Knee pain   . Neuropathy    PERIPHERAL   . Osteoporosis   . PMR (polymyalgia rheumatica) (HCC)   . Polymyalgia ([place]) 5 YEARS    Past Surgical History:  Procedure Laterality Date  . CARPAL TUNNEL RELEASE    . CATARACT EXTRACTION     right  . COLONOSCOPY  2007  . INGUINAL HERNIA REPAIR  10/14/2011   Procedure: HERNIA REPAIR INGUINAL ADULT;  Surgeon: [name], MD;  Location: [place];  Service: General;  Laterality: Right;  Right Inguinal Hernia Repair with Mesh  . TONSILLECTOMY    . VARICOSE VEIN SURGERY      There were no vitals filed for this visit.  Subjective Assessment - 05/05/18 0811    Subjective  It's not a good day.  My knee gives way at unexpected moments.  Happens 6-8x a day.  My hip hurts today too.  Using Rw today.      Currently in Pain?  Yes    Pain Score  5     Pain Location  Knee    Multiple Pain Sites  Yes    Pain Score  6    Pain Location  Hip         [facility] PT Assessment - 05/05/18 0001      Assessment   Medical Diagnosis  patellofemoral arthritis right knee     Referring Provider (PT)  Dr. Hulan Saas     Next MD Visit  Monday     Prior Therapy  > 1 year ago       AROM   Right Knee Extension  1    Right Knee Flexion  120      Strength   Right Hip Flexion  4+/5    Right Knee Flexion  4-/5    Right Knee Extension  4/5      Standardized Balance Assessment   Five times sit to stand comments   16 sec    10 Meter Walk  18    with RW     Timed Up and Go Test   Normal TUG (seconds)  20.6    TUG Comments  with RW                   [facility] Adult PT Treatment/Exercise - 05/05/18 0001      Knee/Hip Exercises: Stretches   Sports administrator Limitations  2nd step rocks 10x right/left with single arm reach overhead 5x right/left    Other Knee/Hip Stretches  standing slant board 10x  right/left      Knee/Hip Exercises: Aerobic   Nustep  seat 13 arms 11 L3 10 min    PT present to discuss progress- steady pace     Knee/Hip Exercises: Machines for Strengthening   Cybex Knee Extension  10# double leg 25x    Cybex Knee Flexion  30# bil 50x; 20# single leg 50x each     Cybex Leg Press  75# bil 50x; 40# single leg 50x each       Knee/Hip Exercises: Standing   Heel Raises  Both;20 reps               PT Short Term Goals - 05/05/18 0848      PT SHORT TERM GOAL #1   Title  be independent in initial HEP      Status  Achieved      PT SHORT TERM GOAL #2   Title  The patient will have improved right knee ROM 3-120 degrees needed for greater ease getting up and down from the chair and in/out of the car    Status  Achieved      PT SHORT TERM GOAL #3   Title  The patient will have improved hip flexor muscle length to 5 degrees and HS length to 65 degrees needed for improved stride length with ambulation    Status  Achieved      PT SHORT TERM GOAL #4   Title  Timed up and Go to 16 sec indicating improved gait safety    Status  Achieved      PT SHORT TERM GOAL #5   Title  Ten meter walk test to 14 sec indicate improved gait speed needed for community ambulation     Status  On-going        PT Long  Term Goals - 05/05/18 0848      PT LONG TERM GOAL #1   Title  be independent in advanced HEP for further improvements in strength, ROM and balance      Time  8    Period  Weeks    Status  On-going      PT LONG TERM GOAL #2   Title  Right knee ROM improved to 2-125 degrees needed for greater mobility     Time  8    Period  Weeks    Status  On-going      PT LONG TERM GOAL #3   Title  Improved right knee extension strength to 4+/5 and knee flexion strength to 5-/5 needed for greater ease rising    Status  Revised      PT LONG TERM GOAL #4   Title  Improved knee pain with usual ADLs by 50%    Time  8    Period  Weeks    Status  Revised      PT LONG TERM GOAL #5   Title  The patient will be able to ambulate 500 feet in six minutes needed for shorter distance community ambulation for traveling    Time  8    Period  Weeks    Status  On-going            Plan - 05/05/18 6789    Clinical Impression Statement  The patient reports continued moderate knee pain today with frequent knee give way (occurred 1x during treatment session).  He is using his RW today secondary to rainy weather and pain.  Improved gait smoothness and safety with RW vs.  cane.  Improvement in 2 of 4 functional outcome measures.  Improving knee ROM and LE strength.  He does fatigue  with exercises and needs sitting rest breaks often.  He would benefit from continued PT to prepare for his upcoming trip to [place] and further LE strengthening to further improve gait safety and function.  Without PT he would have a certain decline in function.      Rehab Potential  Good    Clinical Impairments Affecting Rehab Potential  risk for falls    PT Frequency  2x / week    PT Duration  8 weeks    PT Treatment/Interventions  ADLs/Self Care Home Management;Cryotherapy;Electrical Stimulation;Ultrasound;Moist Heat;Iontophoresis 4mg /ml Dexamethasone;Gait training;Stair training;Therapeutic activities;Therapeutic  exercise;Patient/family education;Neuromuscular re-education;Manual techniques;Taping;Dry needling    PT Next Visit Plan   KX;  Do 6 min walk test;  Continue balance, strength and endurance; leg press;  gait with cane;  practice steps;   continue to prepare for upcoming trip to [place]    PT Home Exercise Plan  Access Code: [email or web address]     Consulted and Agree with Plan of Care  Patient       Patient will benefit from skilled therapeutic intervention in order to improve the following deficits and impairments:  Pain, Decreased range of motion, Decreased strength, Decreased activity tolerance, Impaired perceived functional ability, Difficulty walking, Decreased balance  Visit Diagnosis: Chronic pain of right knee - Plan: PT plan of care cert/re-cert  Stiffness of right knee, not elsewhere classified - Plan: PT plan of care cert/re-cert  Muscle weakness (generalized) - Plan: PT plan of care cert/re-cert     Problem List Patient Active Problem List   Diagnosis Date Noted  . Chronic left SI joint pain 04/27/2018  . Right wrist pain 02/05/2018  . Degenerative arthritis of right knee 10/23/2017  . Left leg cellulitis 08/12/2017  . Arthritis of right knee 05/20/2017  . Diverticulosis of colon with hemorrhage   . Acute blood loss anemia   . GIB (gastrointestinal bleeding) 03/12/2017  . Leg edema, right 02/20/2015  . BPH (benign prostatic hyperplasia) 12/09/2014  . Chronic radicular low back pain 08/06/2012  . COPD (chronic obstructive pulmonary disease) ([place]) 04/21/2012  . Syncope 03/29/2012  . Hyponatremia 03/29/2012  . Localized osteoarthrosis not specified whether primary or secondary, lower leg 06/20/2010  . VARICOSE VEINS LOWER EXTREMITIES W/INFLAMMATION 02/12/2010  . [place] LOCALIZED OSTEOARTHROSIS INVOLVING HAND 10/16/2009  . DUPUYTREN'S CONTRACTURE, RIGHT 07/04/2009  . DEGENERATIVE DISC DISEASE, LUMBOSACRAL SPINE W/RADICULOPATHY 06/19/2009  . CARCINOMA, BASAL CELL 02/07/2009  .  ANEMIA OF CHRONIC DISEASE 02/07/2009  . BURSITIS, RIGHT SHOULDER 10/07/2008  . CATARACT ASSOCIATED WITH OTHER SYNDROMES 08/01/2008  . DRY SKIN 08/01/2008  . CERUMEN IMPACTION, BILATERAL 03/01/2008  . PMR (polymyalgia rheumatica) ([place]) 01/18/2008  . CRAMP IN LIMB 01/18/2008  . DEPRESSION, SITUATIONAL, ACUTE 12/28/2007  . CONSTIPATION, DRUG INDUCED 12/28/2007  . POLYNEUROPATHY OTHER DISEASES CLASSIFIED ELSW 09/24/2007  . WEIGHT LOSS, ABNORMAL 01/07/2007  . Hyperlipidemia 09/22/2006  . Essential hypertension 09/22/2006  . GERD (gastroesophageal reflux disease) 09/22/2006  . Osteoarthritis 09/22/2006   Ruben Im, PT 05/05/18 6:57 PM Phone: [phone] Fax: [phone] Alvera Singh 05/05/2018, 6:56 PM  [facility] [address], [place], [place], [address] Phone: [phone]   Fax:  [phone]  Name: Darin [name] MRN: [id number] Date of Birth: 14-Feb-1932

## 2018-05-08 ENCOUNTER — Encounter: Payer: Self-pay | Admitting: Physical Therapy

## 2018-05-08 ENCOUNTER — Ambulatory Visit: Payer: Medicare Other | Admitting: Physical Therapy

## 2018-05-08 DIAGNOSIS — M25561 Pain in right knee: Secondary | ICD-10-CM | POA: Diagnosis not present

## 2018-05-08 DIAGNOSIS — G8929 Other chronic pain: Secondary | ICD-10-CM

## 2018-05-08 DIAGNOSIS — R296 Repeated falls: Secondary | ICD-10-CM | POA: Diagnosis not present

## 2018-05-08 DIAGNOSIS — R279 Unspecified lack of coordination: Secondary | ICD-10-CM | POA: Diagnosis not present

## 2018-05-08 DIAGNOSIS — M6281 Muscle weakness (generalized): Secondary | ICD-10-CM | POA: Diagnosis not present

## 2018-05-08 DIAGNOSIS — M25661 Stiffness of right knee, not elsewhere classified: Secondary | ICD-10-CM

## 2018-05-08 NOTE — Therapy (Signed)
[facility] [address], [place], [place], [address] Phone: [phone]   Fax:  [phone]  Physical Therapy Treatment  Patient Details  Name: Darin [name] MRN: [id number] Date of Birth: [birth year] Referring Provider (PT): Dr. Hulan Saas    Encounter Date: 05/08/2018  PT End of Session - 05/08/18 0756    Visit Number  16    Date for PT Re-Evaluation  06/30/18    Authorization Type  Medicare    PT Start Time  7510    PT Stop Time  0835    PT Time Calculation (min)  48 min    Activity Tolerance  Patient tolerated treatment well       Past Medical History:  Diagnosis Date  . Arthritis   . GERD (gastroesophageal reflux disease)   . H/O hiatal hernia    "FOR YEARS" " NO PROBLEM"  . Hyperlipidemia   . Hypertension   . Knee pain   . Neuropathy    PERIPHERAL   . Osteoporosis   . PMR (polymyalgia rheumatica) (HCC)   . Polymyalgia ([place]) 5 YEARS    Past Surgical History:  Procedure Laterality Date  . CARPAL TUNNEL RELEASE    . CATARACT EXTRACTION     right  . COLONOSCOPY  2007  . INGUINAL HERNIA REPAIR  10/14/2011   Procedure: HERNIA REPAIR INGUINAL ADULT;  Surgeon: [name], MD;  Location: [place];  Service: General;  Laterality: Right;  Right Inguinal Hernia Repair with Mesh  . TONSILLECTOMY    . VARICOSE VEIN SURGERY      There were no vitals filed for this visit.  Subjective Assessment - 05/08/18 0749    Subjective  Presents with RW.  States yesterday was "hell" for his knee.  It didn't hurt, it just wouldn't stay in place.   I'm not wearing my brace b/c it slides down my leg.  I see the doctor on Monday and I'm going to discuss it with him.  I hope I'm not thrown off on this trip with using the 2 walking poles.  Back of the hip is still sore.      Currently in Pain?  No/denies    Pain Score  0-No pain    Pain Location  Knee    Pain Orientation  Right    Aggravating Factors   mornings     Pain Type  Chronic pain         [facility] PT Assessment - 05/08/18 0001      6 Minute Walk- Baseline   6 Minute Walk- Baseline  yes   with RW   Perceived Rate of Exertion ([place])  7- Very, very light   it's my head more than fatigue     6 minute walk test results    Aerobic Endurance Distance Walked  450    Endurance additional comments  with RW gait speed decreasing; reports 2 episodes of knee catching;  dizziness                   [facility] Adult PT Treatment/Exercise - 05/08/18 0001      Knee/Hip Exercises: Stretches   Active Hamstring Stretch Limitations  --    Sports administrator Limitations  2nd step rocks 10x right/left combo with going straight for HS stretch and forward for hip flexor stretch      Knee/Hip Exercises: Aerobic   Nustep  seat 13 arms 11 L3 10 min    PT present to  discuss progress- steady pace     Knee/Hip Exercises: Machines for Strengthening   Cybex Knee Extension  10# double leg 50x    Cybex Knee Flexion  30# bil 50x; 20# single leg 50x each     Cybex Leg Press  75# bil 50x; 40# single leg 50x each       Knee/Hip Exercises: Standing   Heel Raises  Both;20 reps    Lateral Step Up  Right;Left;1 set;10 reps;Hand Hold: 2;Step Height: 2"    Forward Step Up  Right;Left;1 set;10 reps;Hand Hold: 2;Step Height: 2"               PT Short Term Goals - 05/05/18 0848      PT SHORT TERM GOAL #1   Title  be independent in initial HEP      Status  Achieved      PT SHORT TERM GOAL #2   Title  The patient will have improved right knee ROM 3-120 degrees needed for greater ease getting up and down from the chair and in/out of the car    Status  Achieved      PT SHORT TERM GOAL #3   Title  The patient will have improved hip flexor muscle length to 5 degrees and HS length to 65 degrees needed for improved stride length with ambulation    Status  Achieved      PT SHORT TERM GOAL #4   Title  Timed up and Go to 16 sec indicating improved gait safety     Status  Achieved      PT SHORT TERM GOAL #5   Title  Ten meter walk test to 14 sec indicate improved gait speed needed for community ambulation     Status  On-going        PT Long Term Goals - 05/05/18 0848      PT LONG TERM GOAL #1   Title  be independent in advanced HEP for further improvements in strength, ROM and balance      Time  8    Period  Weeks    Status  On-going      PT LONG TERM GOAL #2   Title  Right knee ROM improved to 2-125 degrees needed for greater mobility     Time  8    Period  Weeks    Status  On-going      PT LONG TERM GOAL #3   Title  Improved right knee extension strength to 4+/5 and knee flexion strength to 5-/5 needed for greater ease rising    Status  Revised      PT LONG TERM GOAL #4   Title  Improved knee pain with usual ADLs by 50%    Time  8    Period  Weeks    Status  Revised      PT LONG TERM GOAL #5   Title  The patient will be able to ambulate 500 feet in six minutes needed for shorter distance community ambulation for traveling    Time  8    Period  Weeks    Status  On-going            Plan - 05/08/18 0757    Clinical Impression Statement  The patient is able to walk 450 feet in six minutes using his RW with decreasing gait speed with fatigue.  He reports 2 episodes of knee give-way.  We discussed that his speed would be even slower  with his walking poles and he would be much less stable balance-wise.  I have expressed my safety concerns with using his walking poles vs. his RW but he is adamant that his RW is not allowed on his trip to [place].  He is able to continue with LE strengthening without an increase in hip or knee pain today.  His main complaint is periodic dizziness which subsides with sitting rest breaks.  Therapist closely monitoring response with all and providing close supervision for safety.      Rehab Potential  Good    Clinical Impairments Affecting Rehab Potential  risk for falls    PT Frequency  2x / week    PT  Duration  8 weeks    PT Treatment/Interventions  ADLs/Self Care Home Management;Cryotherapy;Electrical Stimulation;Ultrasound;Moist Heat;Iontophoresis 4mg /ml Dexamethasone;Gait training;Stair training;Therapeutic activities;Therapeutic exercise;Patient/family education;Neuromuscular re-education;Manual techniques;Taping;Dry needling    PT Next Visit Plan   KX; see how Monday MD appt went;  Continue balance, strength and endurance; leg press;  gait with cane;  practice steps;   continue to prepare for upcoming trip to [place] on Thursday.       PT Home Exercise Plan  Access Code: [email or web address]        Patient will benefit from skilled therapeutic intervention in order to improve the following deficits and impairments:  Pain, Decreased range of motion, Decreased strength, Decreased activity tolerance, Impaired perceived functional ability, Difficulty walking, Decreased balance  Visit Diagnosis: Chronic pain of right knee  Stiffness of right knee, not elsewhere classified  Muscle weakness (generalized)     Problem List Patient Active Problem List   Diagnosis Date Noted  . Chronic left SI joint pain 04/27/2018  . Right wrist pain 02/05/2018  . Degenerative arthritis of right knee 10/23/2017  . Left leg cellulitis 08/12/2017  . Arthritis of right knee 05/20/2017  . Diverticulosis of colon with hemorrhage   . Acute blood loss anemia   . GIB (gastrointestinal bleeding) 03/12/2017  . Leg edema, right 02/20/2015  . BPH (benign prostatic hyperplasia) 12/09/2014  . Chronic radicular low back pain 08/06/2012  . COPD (chronic obstructive pulmonary disease) ([place]) 04/21/2012  . Syncope 03/29/2012  . Hyponatremia 03/29/2012  . Localized osteoarthrosis not specified whether primary or secondary, lower leg 06/20/2010  . VARICOSE VEINS LOWER EXTREMITIES W/INFLAMMATION 02/12/2010  . [place] LOCALIZED OSTEOARTHROSIS INVOLVING HAND 10/16/2009  . DUPUYTREN'S CONTRACTURE, RIGHT 07/04/2009  . DEGENERATIVE DISC  DISEASE, LUMBOSACRAL SPINE W/RADICULOPATHY 06/19/2009  . CARCINOMA, BASAL CELL 02/07/2009  . ANEMIA OF CHRONIC DISEASE 02/07/2009  . BURSITIS, RIGHT SHOULDER 10/07/2008  . CATARACT ASSOCIATED WITH OTHER SYNDROMES 08/01/2008  . DRY SKIN 08/01/2008  . CERUMEN IMPACTION, BILATERAL 03/01/2008  . PMR (polymyalgia rheumatica) ([place]) 01/18/2008  . CRAMP IN LIMB 01/18/2008  . DEPRESSION, SITUATIONAL, ACUTE 12/28/2007  . CONSTIPATION, DRUG INDUCED 12/28/2007  . POLYNEUROPATHY OTHER DISEASES CLASSIFIED ELSW 09/24/2007  . WEIGHT LOSS, ABNORMAL 01/07/2007  . Hyperlipidemia 09/22/2006  . Essential hypertension 09/22/2006  . GERD (gastroesophageal reflux disease) 09/22/2006  . Osteoarthritis 09/22/2006   Ruben Im, PT 05/08/18 8:40 AM Phone: [phone] Fax: [phone]  Alvera Singh 05/08/2018, 8:38 AM  [facility] [address], [place], [place], [address] Phone: [phone]   Fax:  [phone]  Name: Darin [name] MRN: [id number] Date of Birth: March 31, 1931

## 2018-05-10 NOTE — Progress Notes (Signed)
[name] [place], [place] [address] Phone: [phone] Subjective:    I Darin [name] am serving as a Education administrator for Dr. Hulan Saas.   I'm seeing this patient by the request  of:    CC: Right knee pain and back pain follow-up  [id number]    04/27/2018 Degenerative arthritis.  Discussed icing regimen and home exercise.  Which activities to do which wants to avoid.  Hold on any other injection at this point no swelling noted.  Different brace given.  Follow-up again in 2 weeks.  If needed do an injection before her trip.  Patient given injection today. Patient tolerated the procedure well.  I believe that patient is loading the sacroiliac joint secondary to compensation for the knee.  New knee brace given and patient seems to be improving with the discomfort.  Encourage patient continue with formal physical therapy.  Patient will see me again right before his trip and hopefully will make any changes so he can enjoy.  Updated 05/11/2018 Darin [name] is a 85 y.o. male coming in with complaint of right knee pain. States his knee pain comes and goes. Pain with turning that is lateral. Patellar tendon pain. Left leg is giving him pain in the calf.  Patient states that it is intermittent.  Nothing severe.  Patient is more concerned now that patient will not be able to do as much walking on a regular basis without his walker.  Does have hiking sticks.     Right severe arthritis and did respond fairly well to PRP.  Patient was having back pain at last exam and we attempted a left sacroiliac injection on April 27, 2018.  Past Medical History:  Diagnosis Date  . Arthritis   . GERD (gastroesophageal reflux disease)   . H/O hiatal hernia    "FOR YEARS" " NO PROBLEM"  . Hyperlipidemia   . Hypertension   . Knee pain   . Neuropathy    PERIPHERAL   . Osteoporosis   . PMR (polymyalgia rheumatica) (HCC)   . Polymyalgia ([place]) 5 YEARS   Past  Surgical History:  Procedure Laterality Date  . CARPAL TUNNEL RELEASE    . CATARACT EXTRACTION     right  . COLONOSCOPY  2007  . INGUINAL HERNIA REPAIR  10/14/2011   Procedure: HERNIA REPAIR INGUINAL ADULT;  Surgeon: [name], MD;  Location: [place];  Service: General;  Laterality: Right;  Right Inguinal Hernia Repair with Mesh  . TONSILLECTOMY    . VARICOSE VEIN SURGERY     Social History   Socioeconomic History  . Marital status: Widowed    Spouse name: Not on file  . Number of children: Not on file  . Years of education: Not on file  . Highest education level: Not on file  Occupational History  . Occupation: retired    Fish farm manager: RETIRED  Social Needs  . Financial resource strain: Not on file  . Food insecurity:    Worry: Not on file    Inability: Not on file  . Transportation needs:    Medical: Not on file    Non-medical: Not on file  Tobacco Use  . Smoking status: Never Smoker  . Smokeless tobacco: Never Used  Substance and Sexual Activity  . Alcohol use: Yes    Alcohol/week: 3.0 standard drinks    Types: 3 Glasses of wine per week    Comment: half glass wine before dinner  . Drug  use: No  . Sexual activity: Yes  Lifestyle  . Physical activity:    Days per week: Not on file    Minutes per session: Not on file  . Stress: Not on file  Relationships  . Social connections:    Talks on phone: Not on file    Gets together: Not on file    Attends religious service: Not on file    Active member of club or organization: Not on file    Attends meetings of clubs or organizations: Not on file    Relationship status: Not on file  Other Topics Concern  . Not on file  Social History Narrative  . Not on file   Allergies  Allergen Reactions  . Statins Other (See Comments)    Muscle pain and upset stomach   Family History  Problem Relation Age of Onset  . Heart disease Father   . Coronary artery disease Unknown     Current Outpatient Medications  (Endocrine & Metabolic):  .  predniSONE (DELTASONE) 50 MG tablet, Take 1 tablet (50 mg total) by mouth daily.  Current Outpatient Medications (Cardiovascular):  .  amLODipine (NORVASC) 5 MG tablet, TAKE 1 TABLET DAILY .  benazepril-hydrochlorthiazide (LOTENSIN HCT) 10-12.5 MG tablet, TAKE 1 TABLET DAILY        *PATIENT NEEDS AN          APPOINTMENT*   Current Outpatient Medications (Analgesics):  .  aspirin 81 MG tablet, Take 81 mg by mouth daily.    Current Outpatient Medications (Other):  .  famotidine (PEPCID) 20 MG tablet, TAKE 1 TABLET AT BEDTIME .  finasteride (PROSCAR) 5 MG tablet, TAKE 1 TABLET EACH DAY. Marland Kitchen  gabapentin (NEURONTIN) 100 MG capsule, TAKE 1 CAPSULE 4 TIMES     DAILY .  niacin 500 MG tablet, Take 500 mg by mouth 2 (two) times daily with a meal.  .  polyethylene glycol (MIRALAX / GLYCOLAX) packet, Take 17 g by mouth daily as needed for moderate constipation. .  triamcinolone ointment (KENALOG) 0.1 %, Apply 1 application topically 2 (two) times daily. To affected area .  vitamin C (ASCORBIC ACID) 500 MG tablet, Take 500 mg by mouth daily.    Past medical history, social, surgical and family history all reviewed in electronic medical record.  No pertanent information unless stated regarding to the chief complaint.   Review of Systems:  No headache, visual changes, nausea, vomiting, diarrhea, constipation, dizziness, abdominal pain, skin rash, fevers, chills, night sweats, weight loss, swollen lymph nodes, chest pain, shortness of breath, mood changes.  Positive muscle aches, body aches  Objective  Blood pressure 134/62, pulse 98, height 6\' 1"  (1.854 m), weight 194 lb (88 kg), SpO2 98 %.   General: No apparent distress alert and oriented x3 mood and affect normal, dressed appropriately.  HEENT: Pupils equal, extraocular movements intact  Respiratory: Patient's speak in full sentences and does not appear short of breath  Cardiovascular: No lower extremity edema, non  tender, no erythema  Skin: Warm dry intact with no signs of infection or rash on extremities or on axial skeleton.  Abdomen: Soft nontender  Neuro: Cranial nerves II through XII are intact, neurovascularly intact in all extremities with 2+ DTRs and 2+ pulses.  Lymph: No lymphadenopathy of posterior or anterior cervical chain or axillae bilaterally.  Gait severely antalgic walking with the aid of a walker MSK:  tender with mid range of motion and stability and symmetric strength and tone of shoulders, elbows, wrist,  hip, and ankles bilaterally.  Knee: Right valgus deformity noted. Large thigh to calf ratio.  Tender to palpation over medial and PF joint line.  ROM full in flexion and extension and lower leg rotation. instability with valgus force.  painful patellar compression. Patellar glide with moderate crepitus. Patellar and quadriceps tendons unremarkable. Hamstring and quadriceps strength is normal. Contralateral knee shows replacement.  Back exam has some loss of lordosis with some degenerative scoliosis.  Unable to do [place] secondary to tightness.  Straight leg test does have severe tightness of the hamstrings but no significant radicular symptoms.  Patient does have some mild atrophy of the lower extremities bilaterally but seems to be symmetric with symmetric strength.   Impression and Recommendations:     This case required medical decision making of moderate complexity. The above documentation has been reviewed and is accurate and complete Lyndal Pulley, DO       Note: This dictation was prepared with Dragon dictation along with smaller phrase technology. Any transcriptional errors that result from this process are unintentional.

## 2018-05-11 ENCOUNTER — Ambulatory Visit (INDEPENDENT_AMBULATORY_CARE_PROVIDER_SITE_OTHER)
Admission: RE | Admit: 2018-05-11 | Discharge: 2018-05-11 | Disposition: A | Discharge status: Home / Self Care | Payer: Medicare Other | Source: Ambulatory Visit | Attending: Family Medicine | Admitting: Family Medicine

## 2018-05-11 ENCOUNTER — Ambulatory Visit (INDEPENDENT_AMBULATORY_CARE_PROVIDER_SITE_OTHER): Payer: Medicare Other | Admitting: Family Medicine

## 2018-05-11 ENCOUNTER — Encounter: Payer: Self-pay | Admitting: Family Medicine

## 2018-05-11 VITALS — BP 134/62 | HR 98 | Ht 73.0 in | Wt 194.0 lb

## 2018-05-11 DIAGNOSIS — M1711 Unilateral primary osteoarthritis, right knee: Secondary | ICD-10-CM | POA: Diagnosis not present

## 2018-05-11 DIAGNOSIS — M549 Dorsalgia, unspecified: Secondary | ICD-10-CM | POA: Diagnosis not present

## 2018-05-11 DIAGNOSIS — G8929 Other chronic pain: Secondary | ICD-10-CM | POA: Diagnosis not present

## 2018-05-11 DIAGNOSIS — M545 Low back pain: Secondary | ICD-10-CM | POA: Diagnosis not present

## 2018-05-11 DIAGNOSIS — M353 Polymyalgia rheumatica: Secondary | ICD-10-CM

## 2018-05-11 DIAGNOSIS — M5126 Other intervertebral disc displacement, lumbar region: Secondary | ICD-10-CM | POA: Diagnosis not present

## 2018-05-11 MED ORDER — PREDNISONE 50 MG PO TABS: 50.0000 mg | ORAL_TABLET | Freq: Every day | ORAL | 0 refills | Status: DC

## 2018-05-11 NOTE — Assessment & Plan Note (Signed)
Patient did not respond well to the sacroiliac injection.  Concern for more of a lumbar radiculopathy that is contributing.  New x-rays were taken today and with patient having weakness in the legs I do feel that advanced imaging would be warranted with an MRI.  Patient could be a good candidate for epidurals if needed.  Patient is in agreement with the plan.  Will be going on a trip though and given 5 days of prednisone in case.  History of polymyalgia rheumatica also noted.

## 2018-05-11 NOTE — Patient Instructions (Addendum)
Good to see you  Wear the brace like we showed you  Prednsione only if you need it.  Have a great trip  Back xray today then MRi of back ordered  See me again in 3-4 weeks if you need me

## 2018-05-11 NOTE — Assessment & Plan Note (Signed)
Discussed with patient in great length.  Patient is going to continue with conservative therapy.  Feels like he is making progress.  Discussed icing regimen and home exercise.  Follow-up with me again in 4 to 6 weeks

## 2018-05-12 ENCOUNTER — Ambulatory Visit: Payer: Medicare Other

## 2018-05-12 DIAGNOSIS — G8929 Other chronic pain: Secondary | ICD-10-CM

## 2018-05-12 DIAGNOSIS — R296 Repeated falls: Secondary | ICD-10-CM

## 2018-05-12 DIAGNOSIS — M25661 Stiffness of right knee, not elsewhere classified: Secondary | ICD-10-CM | POA: Diagnosis not present

## 2018-05-12 DIAGNOSIS — M25561 Pain in right knee: Principal | ICD-10-CM

## 2018-05-12 DIAGNOSIS — M6281 Muscle weakness (generalized): Secondary | ICD-10-CM | POA: Diagnosis not present

## 2018-05-12 DIAGNOSIS — R279 Unspecified lack of coordination: Secondary | ICD-10-CM

## 2018-05-12 NOTE — Therapy (Signed)
[facility] [address], [place], [place], [address] Phone: [phone]   Fax:  [phone]  Physical Therapy Treatment  Patient Details  Name: Darin [name] MRN: [id number] Date of Birth: April 20, 1931 Referring Provider (PT): Dr. Hulan Saas    Encounter Date: 05/12/2018  PT End of Session - 05/12/18 0929    Visit Number  17    Date for PT Re-Evaluation  06/30/18    Authorization Type  Medicare     PT Start Time  0843    PT Stop Time  0929    PT Time Calculation (min)  46 min    Activity Tolerance  Patient tolerated treatment well    Behavior During Therapy  [employer] for tasks assessed/performed       Past Medical History:  Diagnosis Date  . Arthritis   . GERD (gastroesophageal reflux disease)   . H/O hiatal hernia    "FOR YEARS" " NO PROBLEM"  . Hyperlipidemia   . Hypertension   . Knee pain   . Neuropathy    PERIPHERAL   . Osteoporosis   . PMR (polymyalgia rheumatica) (HCC)   . Polymyalgia ([place]) 5 YEARS    Past Surgical History:  Procedure Laterality Date  . CARPAL TUNNEL RELEASE    . CATARACT EXTRACTION     right  . COLONOSCOPY  2007  . INGUINAL HERNIA REPAIR  10/14/2011   Procedure: HERNIA REPAIR INGUINAL ADULT;  Surgeon: [name], MD;  Location: [place];  Service: General;  Laterality: Right;  Right Inguinal Hernia Repair with Mesh  . TONSILLECTOMY    . VARICOSE VEIN SURGERY      There were no vitals filed for this visit.  Subjective Assessment - 05/12/18 0850    Subjective  Pt saw MD yesterday.  He prescribed prednisone just in case he needs it.  Using 2 walking sticks for gait and leaving for trip on Thursday.      Patient Stated Goals  I would like to be free of pain and be able to walk with a cane    Currently in Pain?  No/denies                       [facility] Adult PT Treatment/Exercise - 05/12/18 0001      Knee/Hip Exercises: Stretches   Sports administrator Limitations   2nd step rocks 10x right/left combo with going straight    Other Knee/Hip Stretches  standing slant board 10x right/left      Knee/Hip Exercises: Aerobic   Nustep  seat 13 arms 11 L3 10 min    PT present to discuss progress- steady pace     Knee/Hip Exercises: Machines for Strengthening   Cybex Knee Extension  10# double leg 25x    Cybex Knee Flexion  30# bil 50x; 20# single leg 50x each     Cybex Leg Press  75# bil 50x; 40# single leg 50x each       Knee/Hip Exercises: Standing   Heel Raises  Both;20 reps    Lateral Step Up  Right;Left;1 set;10 reps;Hand Hold: 2;Step Height: 2"    Forward Step Up  Right;Left;1 set;10 reps;Hand Hold: 2;Step Height: 2"               PT Short Term Goals - 05/05/18 0848      PT SHORT TERM GOAL #1   Title  be independent in initial HEP      Status  Achieved      PT SHORT TERM GOAL #2   Title  The patient will have improved right knee ROM 3-120 degrees needed for greater ease getting up and down from the chair and in/out of the car    Status  Achieved      PT SHORT TERM GOAL #3   Title  The patient will have improved hip flexor muscle length to 5 degrees and HS length to 65 degrees needed for improved stride length with ambulation    Status  Achieved      PT SHORT TERM GOAL #4   Title  Timed up and Go to 16 sec indicating improved gait safety    Status  Achieved      PT SHORT TERM GOAL #5   Title  Ten meter walk test to 14 sec indicate improved gait speed needed for community ambulation     Status  On-going        PT Long Term Goals - 05/05/18 0848      PT LONG TERM GOAL #1   Title  be independent in advanced HEP for further improvements in strength, ROM and balance      Time  8    Period  Weeks    Status  On-going      PT LONG TERM GOAL #2   Title  Right knee ROM improved to 2-125 degrees needed for greater mobility     Time  8    Period  Weeks    Status  On-going      PT LONG TERM GOAL #3   Title  Improved right knee  extension strength to 4+/5 and knee flexion strength to 5-/5 needed for greater ease rising    Status  Revised      PT LONG TERM GOAL #4   Title  Improved knee pain with usual ADLs by 50%    Time  8    Period  Weeks    Status  Revised      PT LONG TERM GOAL #5   Title  The patient will be able to ambulate 500 feet in six minutes needed for shorter distance community ambulation for traveling    Time  8    Period  Weeks    Status  On-going            Plan - 05/12/18 0903    Clinical Impression Statement  Pt is leaving for a trip to [place] in 2 days and is planning to ambulate with 2 walking sticks while there.  Pt demonstrates shortened step length and shuffling gait and this is more prominent when initiating his first steps.  PT provided verbal cueing to correct.  PT discussed safety concerns regarding using walking sticks on his trip.  Pt will continue with PT to address chronic balance and gait deficits after he returns to PT after trip.      Rehab Potential  Good    Clinical Impairments Affecting Rehab Potential  risk for falls    PT Frequency  2x / week    PT Duration  8 weeks    PT Treatment/Interventions  ADLs/Self Care Home Management;Cryotherapy;Electrical Stimulation;Ultrasound;Moist Heat;Iontophoresis 4mg /ml Dexamethasone;Gait training;Stair training;Therapeutic activities;Therapeutic exercise;Patient/family education;Neuromuscular re-education;Manual techniques;Taping;Dry needling    PT Next Visit Plan   KX;  Continue balance, strength and endurance; leg press      PT Home Exercise Plan  Access Code: [email or web address]     Consulted and Agree with Plan of Care  Patient       Patient will benefit from skilled therapeutic intervention in order to improve the following deficits and impairments:  Pain, Decreased range of motion, Decreased strength, Decreased activity tolerance, Impaired perceived functional ability, Difficulty walking, Decreased balance  Visit Diagnosis: Chronic  pain of right knee  Stiffness of right knee, not elsewhere classified  Muscle weakness (generalized)  Unspecified lack of coordination  Repeated falls     Problem List Patient Active Problem List   Diagnosis Date Noted  . Chronic left SI joint pain 04/27/2018  . Right wrist pain 02/05/2018  . Degenerative arthritis of right knee 10/23/2017  . Left leg cellulitis 08/12/2017  . Arthritis of right knee 05/20/2017  . Diverticulosis of colon with hemorrhage   . Acute blood loss anemia   . GIB (gastrointestinal bleeding) 03/12/2017  . Leg edema, right 02/20/2015  . BPH (benign prostatic hyperplasia) 12/09/2014  . Chronic radicular low back pain 08/06/2012  . COPD (chronic obstructive pulmonary disease) ([place]) 04/21/2012  . Syncope 03/29/2012  . Hyponatremia 03/29/2012  . Localized osteoarthrosis not specified whether primary or secondary, lower leg 06/20/2010  . VARICOSE VEINS LOWER EXTREMITIES W/INFLAMMATION 02/12/2010  . [place] LOCALIZED OSTEOARTHROSIS INVOLVING HAND 10/16/2009  . DUPUYTREN'S CONTRACTURE, RIGHT 07/04/2009  . DEGENERATIVE DISC DISEASE, LUMBOSACRAL SPINE W/RADICULOPATHY 06/19/2009  . CARCINOMA, BASAL CELL 02/07/2009  . ANEMIA OF CHRONIC DISEASE 02/07/2009  . BURSITIS, RIGHT SHOULDER 10/07/2008  . CATARACT ASSOCIATED WITH OTHER SYNDROMES 08/01/2008  . DRY SKIN 08/01/2008  . CERUMEN IMPACTION, BILATERAL 03/01/2008  . PMR (polymyalgia rheumatica) ([place]) 01/18/2008  . CRAMP IN LIMB 01/18/2008  . DEPRESSION, SITUATIONAL, ACUTE 12/28/2007  . CONSTIPATION, DRUG INDUCED 12/28/2007  . POLYNEUROPATHY OTHER DISEASES CLASSIFIED ELSW 09/24/2007  . WEIGHT LOSS, ABNORMAL 01/07/2007  . Hyperlipidemia 09/22/2006  . Essential hypertension 09/22/2006  . GERD (gastroesophageal reflux disease) 09/22/2006  . Osteoarthritis 09/22/2006    Sigurd Sos, PT 05/12/18 9:31 AM  [facility] [address], [place],  [place], [address] Phone: [phone]   Fax:  [phone]  Name: Nihar Klus MRN: [id number] Date of Birth: 01-28-1932

## 2018-06-02 ENCOUNTER — Ambulatory Visit: Payer: Medicare Other | Attending: Family Medicine

## 2018-06-02 DIAGNOSIS — R279 Unspecified lack of coordination: Secondary | ICD-10-CM | POA: Insufficient documentation

## 2018-06-02 DIAGNOSIS — M6281 Muscle weakness (generalized): Secondary | ICD-10-CM | POA: Diagnosis not present

## 2018-06-02 DIAGNOSIS — M25561 Pain in right knee: Secondary | ICD-10-CM | POA: Diagnosis not present

## 2018-06-02 DIAGNOSIS — M25661 Stiffness of right knee, not elsewhere classified: Secondary | ICD-10-CM | POA: Insufficient documentation

## 2018-06-02 DIAGNOSIS — G8929 Other chronic pain: Secondary | ICD-10-CM | POA: Diagnosis not present

## 2018-06-02 DIAGNOSIS — R296 Repeated falls: Secondary | ICD-10-CM | POA: Diagnosis not present

## 2018-06-02 NOTE — Therapy (Signed)
[facility] [address], [place], [place], [address] Phone: [phone]   Fax:  [phone]  Physical Therapy Treatment  Patient Details  Name: Darin [name] MRN: [id number] Date of Birth: April 23, 1931 Referring Provider (PT): Dr. Hulan Saas    Encounter Date: 06/02/2018  PT End of Session - 06/02/18 0846    Visit Number  18    Date for PT Re-Evaluation  06/30/18    Authorization Type  Medicare     PT Start Time  0801   bathroom break during treatment   PT Stop Time  0846    PT Time Calculation (min)  45 min    Activity Tolerance  Patient tolerated treatment well    Behavior During Therapy  [facility] for tasks assessed/performed       Past Medical History:  Diagnosis Date  . Arthritis   . GERD (gastroesophageal reflux disease)   . H/O hiatal hernia    "FOR YEARS" " NO PROBLEM"  . Hyperlipidemia   . Hypertension   . Knee pain   . Neuropathy    PERIPHERAL   . Osteoporosis   . PMR (polymyalgia rheumatica) (HCC)   . Polymyalgia ([place]) 5 YEARS    Past Surgical History:  Procedure Laterality Date  . CARPAL TUNNEL RELEASE    . CATARACT EXTRACTION     right  . COLONOSCOPY  2007  . INGUINAL HERNIA REPAIR  10/14/2011   Procedure: HERNIA REPAIR INGUINAL ADULT;  Surgeon: [name], MD;  Location: [place];  Service: General;  Laterality: Right;  Right Inguinal Hernia Repair with Mesh  . TONSILLECTOMY    . VARICOSE VEIN SURGERY      There were no vitals filed for this visit.  Subjective Assessment - 06/02/18 0807    Subjective  I had a fall when on my trip.  I recovered quickly and didn't have any injury.      Currently in Pain?  Yes    Pain Score  6     Pain Location  Knee    Pain Orientation  Right    Pain Descriptors / Indicators  Aching    Pain Onset  More than a month ago    Pain Frequency  Constant    Aggravating Factors   standing to shave- it gives out    Pain Relieving Factors  rest                        [facility] Adult PT Treatment/Exercise - 06/02/18 0001      Knee/Hip Exercises: Stretches   Other Knee/Hip Stretches  standing slant board 10x right/left      Knee/Hip Exercises: Aerobic   Nustep  seat 13 arms 11 L3 10 min    PT present to discuss progress- steady pace     Knee/Hip Exercises: Machines for Strengthening   Cybex Knee Extension  10# double leg 25x    Cybex Knee Flexion  30# bil 50x; 20# single leg 50x each     Cybex Leg Press  75# bil 50x; 40# single leg 50x each       Knee/Hip Exercises: Standing   Heel Raises  Both;20 reps               PT Short Term Goals - 05/05/18 0848      PT SHORT TERM GOAL #1   Title  be independent in initial HEP      Status  Achieved  PT SHORT TERM GOAL #2   Title  The patient will have improved right knee ROM 3-120 degrees needed for greater ease getting up and down from the chair and in/out of the car    Status  Achieved      PT SHORT TERM GOAL #3   Title  The patient will have improved hip flexor muscle length to 5 degrees and HS length to 65 degrees needed for improved stride length with ambulation    Status  Achieved      PT SHORT TERM GOAL #4   Title  Timed up and Go to 16 sec indicating improved gait safety    Status  Achieved      PT SHORT TERM GOAL #5   Title  Ten meter walk test to 14 sec indicate improved gait speed needed for community ambulation     Status  On-going        PT Long Term Goals - 05/05/18 0848      PT LONG TERM GOAL #1   Title  be independent in advanced HEP for further improvements in strength, ROM and balance      Time  8    Period  Weeks    Status  On-going      PT LONG TERM GOAL #2   Title  Right knee ROM improved to 2-125 degrees needed for greater mobility     Time  8    Period  Weeks    Status  On-going      PT LONG TERM GOAL #3   Title  Improved right knee extension strength to 4+/5 and knee flexion strength to 5-/5 needed for greater ease  rising    Status  Revised      PT LONG TERM GOAL #4   Title  Improved knee pain with usual ADLs by 50%    Time  8    Period  Weeks    Status  Revised      PT LONG TERM GOAL #5   Title  The patient will be able to ambulate 500 feet in six minutes needed for shorter distance community ambulation for traveling    Time  8    Period  Weeks    Status  On-going            Plan - 06/02/18 4967    Clinical Impression Statement  Pt returned from his trip to [place] after lapse in treatment due to this.  Pt had a fall in [place] due to unlevel terrain and was using 2 walking sticks.  Pt bought a walker while he was there and used it the remainder of the trip.  Pt tolerated progression of exercise well today.  Pt required stand by assistance for safety and cueing.  Pt with continued chronic balance and strength deficits and will continue to benefit from skilled PT for progression of strength, gait and endurance.      Rehab Potential  Good    Clinical Impairments Affecting Rehab Potential  risk for falls    PT Frequency  2x / week    PT Duration  8 weeks    PT Treatment/Interventions  ADLs/Self Care Home Management;Cryotherapy;Electrical Stimulation;Ultrasound;Moist Heat;Iontophoresis 4mg /ml Dexamethasone;Gait training;Stair training;Therapeutic activities;Therapeutic exercise;Patient/family education;Neuromuscular re-education;Manual techniques;Taping;Dry needling    PT Next Visit Plan   KX;  Continue balance, strength and endurance; leg press      PT Home Exercise Plan  Access Code: [email or web address]     Consulted and Agree with  Plan of Care  Patient       Patient will benefit from skilled therapeutic intervention in order to improve the following deficits and impairments:  Pain, Decreased range of motion, Decreased strength, Decreased activity tolerance, Impaired perceived functional ability, Difficulty walking, Decreased balance  Visit Diagnosis: Chronic pain of right knee  Stiffness of right  knee, not elsewhere classified  Muscle weakness (generalized)  Unspecified lack of coordination  Repeated falls     Problem List Patient Active Problem List   Diagnosis Date Noted  . Chronic left SI joint pain 04/27/2018  . Right wrist pain 02/05/2018  . Degenerative arthritis of right knee 10/23/2017  . Left leg cellulitis 08/12/2017  . Arthritis of right knee 05/20/2017  . Diverticulosis of colon with hemorrhage   . Acute blood loss anemia   . GIB (gastrointestinal bleeding) 03/12/2017  . Leg edema, right 02/20/2015  . BPH (benign prostatic hyperplasia) 12/09/2014  . Chronic radicular low back pain 08/06/2012  . COPD (chronic obstructive pulmonary disease) ([place]) 04/21/2012  . Syncope 03/29/2012  . Hyponatremia 03/29/2012  . Localized osteoarthrosis not specified whether primary or secondary, lower leg 06/20/2010  . VARICOSE VEINS LOWER EXTREMITIES W/INFLAMMATION 02/12/2010  . [place] LOCALIZED OSTEOARTHROSIS INVOLVING HAND 10/16/2009  . DUPUYTREN'S CONTRACTURE, RIGHT 07/04/2009  . DEGENERATIVE DISC DISEASE, LUMBOSACRAL SPINE W/RADICULOPATHY 06/19/2009  . CARCINOMA, BASAL CELL 02/07/2009  . ANEMIA OF CHRONIC DISEASE 02/07/2009  . BURSITIS, RIGHT SHOULDER 10/07/2008  . CATARACT ASSOCIATED WITH OTHER SYNDROMES 08/01/2008  . DRY SKIN 08/01/2008  . CERUMEN IMPACTION, BILATERAL 03/01/2008  . PMR (polymyalgia rheumatica) ([place]) 01/18/2008  . CRAMP IN LIMB 01/18/2008  . DEPRESSION, SITUATIONAL, ACUTE 12/28/2007  . CONSTIPATION, DRUG INDUCED 12/28/2007  . POLYNEUROPATHY OTHER DISEASES CLASSIFIED ELSW 09/24/2007  . WEIGHT LOSS, ABNORMAL 01/07/2007  . Hyperlipidemia 09/22/2006  . Essential hypertension 09/22/2006  . GERD (gastroesophageal reflux disease) 09/22/2006  . Osteoarthritis 09/22/2006    Sigurd Sos, PT 06/02/18 8:47 AM  [facility] [address], [place], [place], [address] Phone: [phone]   Fax:   [phone]  Name: Darin [name] MRN: [id number] Date of Birth: 30-Nov-1931

## 2018-06-04 ENCOUNTER — Ambulatory Visit
Admission: RE | Admit: 2018-06-04 | Discharge: 2018-06-04 | Disposition: A | Discharge status: Home / Self Care | Payer: Medicare Other | Source: Ambulatory Visit | Attending: Family Medicine | Admitting: Family Medicine

## 2018-06-04 ENCOUNTER — Other Ambulatory Visit: Payer: Self-pay

## 2018-06-04 DIAGNOSIS — G8929 Other chronic pain: Secondary | ICD-10-CM

## 2018-06-04 DIAGNOSIS — M899 Disorder of bone, unspecified: Secondary | ICD-10-CM | POA: Diagnosis not present

## 2018-06-04 DIAGNOSIS — M549 Dorsalgia, unspecified: Principal | ICD-10-CM

## 2018-06-05 ENCOUNTER — Encounter: Payer: Self-pay | Admitting: Physical Therapy

## 2018-06-05 ENCOUNTER — Ambulatory Visit: Payer: Medicare Other | Admitting: Physical Therapy

## 2018-06-05 DIAGNOSIS — R279 Unspecified lack of coordination: Secondary | ICD-10-CM | POA: Diagnosis not present

## 2018-06-05 DIAGNOSIS — R296 Repeated falls: Secondary | ICD-10-CM | POA: Diagnosis not present

## 2018-06-05 DIAGNOSIS — M25661 Stiffness of right knee, not elsewhere classified: Secondary | ICD-10-CM

## 2018-06-05 DIAGNOSIS — M25561 Pain in right knee: Principal | ICD-10-CM

## 2018-06-05 DIAGNOSIS — G8929 Other chronic pain: Secondary | ICD-10-CM

## 2018-06-05 DIAGNOSIS — M6281 Muscle weakness (generalized): Secondary | ICD-10-CM | POA: Diagnosis not present

## 2018-06-05 NOTE — Therapy (Signed)
[facility] [address] [place], [place], [address] Phone: [phone]   Fax:  [phone]  Physical Therapy Treatment  Patient Details  Name: Darin [name] MRN: [id number] Date of Birth: 05/25/1931 Referring Provider (PT): Dr. Hulan Saas    Encounter Date: 06/05/2018  PT End of Session - 06/05/18 0801    Visit Number  19    Date for PT Re-Evaluation  06/30/18    Authorization Type  Medicare     PT Start Time  0801    PT Stop Time  0848    PT Time Calculation (min)  47 min    Activity Tolerance  Patient tolerated treatment well    Behavior During Therapy  [facility] for tasks assessed/performed       Past Medical History:  Diagnosis Date  . Arthritis   . GERD (gastroesophageal reflux disease)   . H/O hiatal hernia    "FOR YEARS" " NO PROBLEM"  . Hyperlipidemia   . Hypertension   . Knee pain   . Neuropathy    PERIPHERAL   . Osteoporosis   . PMR (polymyalgia rheumatica) (HCC)   . Polymyalgia ([place]) 5 YEARS    Past Surgical History:  Procedure Laterality Date  . CARPAL TUNNEL RELEASE    . CATARACT EXTRACTION     right  . COLONOSCOPY  2007  . INGUINAL HERNIA REPAIR  10/14/2011   Procedure: HERNIA REPAIR INGUINAL ADULT;  Surgeon: [name], MD;  Location: [place];  Service: General;  Laterality: Right;  Right Inguinal Hernia Repair with Mesh  . TONSILLECTOMY    . VARICOSE VEIN SURGERY      There were no vitals filed for this visit.  Subjective Assessment - 06/05/18 0804    Subjective  I did my exercises this morning, then fell asleep so I feel a bit hurried this morning.     Limitations  Walking    Currently in Pain?  Yes    Pain Score  6     Pain Location  --   Lt hip RT knee   Pain Descriptors / Indicators  Sore                       [facility] Adult PT Treatment/Exercise - 06/05/18 0001      Knee/Hip Exercises: Stretches   Other Knee/Hip Stretches  standing slant board 10x  right/left   10 sec hold hold for gastroc     Knee/Hip Exercises: Aerobic   Nustep  seat 13 arms 11 L3 10 min    PTA present to discuss progress- steady pace     Knee/Hip Exercises: Machines for Strengthening   Cybex Knee Extension  10# double leg 25x    Cybex Knee Flexion  30# bil 50x; 20# single leg 50x each     Cybex Leg Press  Seat 9: Bil 75# 20x, 80# 30x VC to slow eccentric portion: Single leg 40# 50x each       Knee/Hip Exercises: Standing   Heel Raises  Both;20 reps               PT Short Term Goals - 05/05/18 0848      PT SHORT TERM GOAL #1   Title  be independent in initial HEP      Status  Achieved      PT SHORT TERM GOAL #2   Title  The patient will have improved right knee ROM 3-120 degrees needed  for greater ease getting up and down from the chair and in/out of the car    Status  Achieved      PT SHORT TERM GOAL #3   Title  The patient will have improved hip flexor muscle length to 5 degrees and HS length to 65 degrees needed for improved stride length with ambulation    Status  Achieved      PT SHORT TERM GOAL #4   Title  Timed up and Go to 16 sec indicating improved gait safety    Status  Achieved      PT SHORT TERM GOAL #5   Title  Ten meter walk test to 14 sec indicate improved gait speed needed for community ambulation     Status  On-going        PT Long Term Goals - 05/05/18 0848      PT LONG TERM GOAL #1   Title  be independent in advanced HEP for further improvements in strength, ROM and balance      Time  8    Period  Weeks    Status  On-going      PT LONG TERM GOAL #2   Title  Right knee ROM improved to 2-125 degrees needed for greater mobility     Time  8    Period  Weeks    Status  On-going      PT LONG TERM GOAL #3   Title  Improved right knee extension strength to 4+/5 and knee flexion strength to 5-/5 needed for greater ease rising    Status  Revised      PT LONG TERM GOAL #4   Title  Improved knee pain with usual ADLs  by 50%    Time  8    Period  Weeks    Status  Revised      PT LONG TERM GOAL #5   Title  The patient will be able to ambulate 500 feet in six minutes needed for shorter distance community ambulation for traveling    Time  8    Period  Weeks    Status  On-going            Plan - 06/05/18 0801    Clinical Impression Statement  Pt reports his Lt hip is bothering him more. He is hopefull the MD will order an epidural for his HNP. Pt had to take an extended bathroom trip during treatment.  Pt also required a 5 min seated rest break after his standing exercises due to becoming dizzy.  He required verbal cuing on the leg press and knee ext/flex to slow speed and increase his control better.     Rehab Potential  Good    Clinical Impairments Affecting Rehab Potential  risk for falls    PT Frequency  2x / week    PT Duration  8 weeks    PT Treatment/Interventions  ADLs/Self Care Home Management;Cryotherapy;Electrical Stimulation;Ultrasound;Moist Heat;Iontophoresis 4mg /ml Dexamethasone;Gait training;Stair training;Therapeutic activities;Therapeutic exercise;Patient/family education;Neuromuscular re-education;Manual techniques;Taping;Dry needling    PT Next Visit Plan   KX;  Continue balance, strength and endurance; leg press      PT Home Exercise Plan  Access Code: [email or web address]     Consulted and Agree with Plan of Care  Patient       Patient will benefit from skilled therapeutic intervention in order to improve the following deficits and impairments:  Pain, Decreased range of motion, Decreased strength, Decreased activity tolerance, Impaired perceived functional ability,  Difficulty walking, Decreased balance  Visit Diagnosis: Chronic pain of right knee  Stiffness of right knee, not elsewhere classified  Muscle weakness (generalized)     Problem List Patient Active Problem List   Diagnosis Date Noted  . Chronic left SI joint pain 04/27/2018  . Right wrist pain 02/05/2018  .  Degenerative arthritis of right knee 10/23/2017  . Left leg cellulitis 08/12/2017  . Arthritis of right knee 05/20/2017  . Diverticulosis of colon with hemorrhage   . Acute blood loss anemia   . GIB (gastrointestinal bleeding) 03/12/2017  . Leg edema, right 02/20/2015  . BPH (benign prostatic hyperplasia) 12/09/2014  . Chronic radicular low back pain 08/06/2012  . COPD (chronic obstructive pulmonary disease) ([place]) 04/21/2012  . Syncope 03/29/2012  . Hyponatremia 03/29/2012  . Localized osteoarthrosis not specified whether primary or secondary, lower leg 06/20/2010  . VARICOSE VEINS LOWER EXTREMITIES W/INFLAMMATION 02/12/2010  . [place] LOCALIZED OSTEOARTHROSIS INVOLVING HAND 10/16/2009  . DUPUYTREN'S CONTRACTURE, RIGHT 07/04/2009  . DEGENERATIVE DISC DISEASE, LUMBOSACRAL SPINE W/RADICULOPATHY 06/19/2009  . CARCINOMA, BASAL CELL 02/07/2009  . ANEMIA OF CHRONIC DISEASE 02/07/2009  . BURSITIS, RIGHT SHOULDER 10/07/2008  . CATARACT ASSOCIATED WITH OTHER SYNDROMES 08/01/2008  . DRY SKIN 08/01/2008  . CERUMEN IMPACTION, BILATERAL 03/01/2008  . PMR (polymyalgia rheumatica) ([place]) 01/18/2008  . CRAMP IN LIMB 01/18/2008  . DEPRESSION, SITUATIONAL, ACUTE 12/28/2007  . CONSTIPATION, DRUG INDUCED 12/28/2007  . POLYNEUROPATHY OTHER DISEASES CLASSIFIED ELSW 09/24/2007  . WEIGHT LOSS, ABNORMAL 01/07/2007  . Hyperlipidemia 09/22/2006  . Essential hypertension 09/22/2006  . GERD (gastroesophageal reflux disease) 09/22/2006  . Osteoarthritis 09/22/2006    Lastacia Solum, PTA 06/05/2018, 8:47 AM  [facility] [address] [place], [place], [address] Phone: [phone]   Fax:  [phone]  Name: Darin [name] MRN: [id number] Date of Birth: 1931-11-04

## 2018-06-06 NOTE — Progress Notes (Signed)
[name] [address], [place] [address] Phone: [phone] Subjective:   Darin [name], am serving as a scribe for Dr. Hulan Saas.   CC: Back pain follow-up  RCV:[id number]  Darin [name] is a 85 y.o. male coming in with complaint of right knee pain. Is having pain with walking. Feels like knee is "popping" and giving out.  Patient states that the pain is okay.  Instability noted.  Patient is here for MRI f/u. Continues to have constant lower back pain.  Patient states that he only can walk approximately 200 feet.  Uses a walker at all times even in the house now.  Feels like having increasing weakness at this moment.  Patient did have an MRI intern on June 04, 2018.  MRI was independently visualized by me.  Had an 8 mm sclerotic lesion at L2 appears to be likely benign.  Progressive anterior listhesis at L4-L5 causing moderate spinal stenosis.  Significant foraminal stenosis at this level as well.  Past Medical History:  Diagnosis Date  . Arthritis   . GERD (gastroesophageal reflux disease)   . H/O hiatal hernia    "FOR YEARS" " [name]"  . Hyperlipidemia   . Hypertension   . Knee pain   . Neuropathy    PERIPHERAL   . Osteoporosis   . PMR (polymyalgia rheumatica) (HCC)   . Polymyalgia ([place]) 5 YEARS   Past Surgical History:  Procedure Laterality Date  . CARPAL TUNNEL RELEASE    . CATARACT EXTRACTION     right  . COLONOSCOPY  2007  . INGUINAL HERNIA REPAIR  10/14/2011   Procedure: HERNIA REPAIR INGUINAL ADULT;  Surgeon: [name], MD;  Location: [facility];  Service: General;  Laterality: Right;  Right Inguinal Hernia Repair with Mesh  . TONSILLECTOMY    . VARICOSE VEIN SURGERY     Social History   Socioeconomic History  . Marital status: Widowed    Spouse name: Not on file  . Number of children: Not on file  . Years of education: Not on file  . Highest education level: Not on file  Occupational History  .  Occupation: retired    Fish farm manager: RETIRED  Social Needs  . Financial resource strain: Not on file  . Food insecurity:    Worry: Not on file    Inability: Not on file  . Transportation needs:    Medical: Not on file    Non-medical: Not on file  Tobacco Use  . Smoking status: Never Smoker  . Smokeless tobacco: Never Used  Substance and Sexual Activity  . Alcohol use: Yes    Alcohol/week: 3.0 standard drinks    Types: 3 Glasses of wine per week    Comment: half glass wine before dinner  . Drug use: [name]  . Sexual activity: Yes  Lifestyle  . Physical activity:    Days per week: Not on file    Minutes per session: Not on file  . Stress: Not on file  Relationships  . Social connections:    Talks on phone: Not on file    Gets together: Not on file    Attends religious service: Not on file    Active member of club or organization: Not on file    Attends meetings of clubs or organizations: Not on file    Relationship status: Not on file  Other Topics Concern  . Not on file  Social History Narrative  . Not  on file   Allergies  Allergen Reactions  . Statins Other (See Comments)    Muscle pain and upset stomach   Family History  Problem Relation Age of Onset  . Heart disease Father   . Coronary artery disease Other     Current Outpatient Medications (Endocrine & Metabolic):  .  predniSONE (DELTASONE) 50 MG tablet, Take 1 tablet (50 mg total) by mouth daily.  Current Outpatient Medications (Cardiovascular):  .  amLODipine (NORVASC) 5 MG tablet, TAKE 1 TABLET DAILY .  benazepril-hydrochlorthiazide (LOTENSIN HCT) 10-12.5 MG tablet, TAKE 1 TABLET DAILY        *PATIENT NEEDS AN          APPOINTMENT*   Current Outpatient Medications (Analgesics):  .  aspirin 81 MG tablet, Take 81 mg by mouth daily.    Current Outpatient Medications (Other):  .  famotidine (PEPCID) 20 MG tablet, TAKE 1 TABLET AT BEDTIME .  finasteride (PROSCAR) 5 MG tablet, TAKE 1 TABLET EACH DAY. Marland Kitchen   gabapentin (NEURONTIN) 100 MG capsule, TAKE 1 CAPSULE 4 TIMES     DAILY .  niacin 500 MG tablet, Take 500 mg by mouth 2 (two) times daily with a meal.  .  polyethylene glycol (MIRALAX / GLYCOLAX) packet, Take 17 g by mouth daily as needed for moderate constipation. .  triamcinolone ointment (KENALOG) 0.1 %, Apply 1 application topically 2 (two) times daily. To affected area .  vitamin C (ASCORBIC ACID) 500 MG tablet, Take 500 mg by mouth daily.    Past medical history, social, surgical and family history all reviewed in electronic medical record.  [name] unless stated regarding to the chief complaint.   Review of Systems:  [name] headache, visual changes, nausea, vomiting, diarrhea, constipation, dizziness, abdominal pain, skin rash, fevers, chills, night sweats, weight loss, swollen lymph nodes, body aches, joint swelling,chest pain, shortness of breath, mood changes.  Positive muscle aches  Objective  Blood pressure 140/62, pulse 83, height 6\' 1"  (1.854 m), weight 196 lb (88.9 kg), SpO2 99 %.   General: [name] apparent distress alert and oriented x3 mood and affect normal, dressed appropriately.  HEENT: Pupils equal, extraocular movements intact  Respiratory: Patient's speak in full sentences and does not appear short of breath  Cardiovascular: Trace lower extremity edema, mild tender, [name] erythema  Skin: Warm dry intact with [name] signs of infection or rash on extremities or on axial skeleton.  Abdomen: Soft nontender  Neuro: Cranial nerves II through XII are intact, neurovascularly intact in all extremities with 2+ DTRs and 2+ pulses.  Lymph: [name] lymphadenopathy of posterior or anterior cervical chain or axillae bilaterally.  Gait severe antalgic gait using the left arm MSK: Patient does have significant arthritic changes of multiple joints.  Patient does have instability of the right knee.  Swelling noted in the right knee as well.  Patient does have some lack of range of motion of  5 to 10 degrees.  Back exam does have loss of lordosis with degenerative scoliosis.  Significant tightness with straight leg test.  Unable to do Faber test secondary to pain and tightness.  Neurovascular intact distally.  Moderate to severe tenderness to palpation in the paraspinal musculature.     Impression and Recommendations:     This case required medical decision making of moderate complexity. The above documentation has been reviewed and is accurate and complete Darin Pulley, DO       Note: This dictation was prepared with Dragon dictation along  with smaller phrase technology. Any transcriptional errors that result from this process are unintentional.

## 2018-06-08 ENCOUNTER — Other Ambulatory Visit: Payer: Self-pay

## 2018-06-08 ENCOUNTER — Other Ambulatory Visit (INDEPENDENT_AMBULATORY_CARE_PROVIDER_SITE_OTHER): Payer: Medicare Other

## 2018-06-08 ENCOUNTER — Ambulatory Visit (INDEPENDENT_AMBULATORY_CARE_PROVIDER_SITE_OTHER): Payer: Medicare Other | Admitting: Family Medicine

## 2018-06-08 ENCOUNTER — Encounter: Payer: Self-pay | Admitting: Family Medicine

## 2018-06-08 VITALS — BP 140/62 | HR 83 | Ht 73.0 in | Wt 196.0 lb

## 2018-06-08 DIAGNOSIS — R5382 Chronic fatigue, unspecified: Secondary | ICD-10-CM | POA: Diagnosis not present

## 2018-06-08 DIAGNOSIS — M2550 Pain in unspecified joint: Secondary | ICD-10-CM

## 2018-06-08 DIAGNOSIS — N400 Enlarged prostate without lower urinary tract symptoms: Secondary | ICD-10-CM | POA: Diagnosis not present

## 2018-06-08 DIAGNOSIS — M5126 Other intervertebral disc displacement, lumbar region: Secondary | ICD-10-CM | POA: Diagnosis not present

## 2018-06-08 DIAGNOSIS — J449 Chronic obstructive pulmonary disease, unspecified: Secondary | ICD-10-CM

## 2018-06-08 DIAGNOSIS — M1711 Unilateral primary osteoarthritis, right knee: Secondary | ICD-10-CM

## 2018-06-08 DIAGNOSIS — M5416 Radiculopathy, lumbar region: Secondary | ICD-10-CM

## 2018-06-08 LAB — SEDIMENTATION RATE: Sed Rate: 14 mm/hr (ref 0–20)

## 2018-06-08 LAB — C-REACTIVE PROTEIN

## 2018-06-08 LAB — PSA: PSA: 0.43 ng/mL (ref 0.10–4.00)

## 2018-06-08 NOTE — Assessment & Plan Note (Signed)
Degenerative disc disease lumbar spine patient does have severe spinal stenosis as well.  I do believe that an L4-L5 epidural would be beneficial.  Patient will be set up for that in the near future.  I do believe with the MRI and the sclerotic appearance we will also get a PSA. Follow-up again in 4 to 8 weeks

## 2018-06-08 NOTE — Assessment & Plan Note (Signed)
Severe.  Contracted PRP.  No significant benefit, cannot wear the brace secondary to the cumbersome aspect of it.  Patient does not want surgical intervention.  Hopefully that patient is having some mild weakness of the lower extremity secondary to the spinal stenosis and hopefully the epidural in the back will be helpful.

## 2018-06-08 NOTE — Assessment & Plan Note (Signed)
History of this with sclerotic lesion in the back.  We will get PSA level to make sure not significantly out of proportion.  If it is then will need to consider a CT scan of the back

## 2018-06-08 NOTE — Patient Instructions (Addendum)
Good to see you  Ice is your friend I would do an epidural as well. You can call [phone] to schedule and I think it will help  Get labs downstairs today  See me again 3 weeks AFTER epidural

## 2018-06-09 ENCOUNTER — Other Ambulatory Visit: Payer: Self-pay

## 2018-06-09 ENCOUNTER — Ambulatory Visit: Payer: Medicare Other

## 2018-06-09 DIAGNOSIS — R296 Repeated falls: Secondary | ICD-10-CM

## 2018-06-09 DIAGNOSIS — M25561 Pain in right knee: Secondary | ICD-10-CM | POA: Diagnosis not present

## 2018-06-09 DIAGNOSIS — M25661 Stiffness of right knee, not elsewhere classified: Secondary | ICD-10-CM | POA: Diagnosis not present

## 2018-06-09 DIAGNOSIS — G8929 Other chronic pain: Secondary | ICD-10-CM

## 2018-06-09 DIAGNOSIS — M6281 Muscle weakness (generalized): Secondary | ICD-10-CM

## 2018-06-09 DIAGNOSIS — R279 Unspecified lack of coordination: Secondary | ICD-10-CM | POA: Diagnosis not present

## 2018-06-09 NOTE — Therapy (Signed)
[facility] [address] [place], [place], [address] Phone: [phone]   Fax:  [phone]  Physical Therapy Treatment  Patient Details  Name: Darin [name] MRN: [id number] Date of Birth: Mar 09, 1932 Referring Provider (PT): Dr. Hulan Saas    Encounter Date: 06/09/2018 Progress Note Reporting Period 04/21/18 to 06/09/18  See note below for Objective Data and Assessment of Progress/Goals.      PT End of Session - 06/09/18 0845    Visit Number  20    Date for PT Re-Evaluation  06/30/18    Authorization Type  Medicare     PT Start Time  0800    PT Stop Time  0845    PT Time Calculation (min)  45 min    Activity Tolerance  Patient tolerated treatment well    Behavior During Therapy  [facility] for tasks assessed/performed       Past Medical History:  Diagnosis Date  . Arthritis   . GERD (gastroesophageal reflux disease)   . H/O hiatal hernia    "FOR YEARS" " NO PROBLEM"  . Hyperlipidemia   . Hypertension   . Knee pain   . Neuropathy    PERIPHERAL   . Osteoporosis   . PMR (polymyalgia rheumatica) (HCC)   . Polymyalgia ([place]) 5 YEARS    Past Surgical History:  Procedure Laterality Date  . CARPAL TUNNEL RELEASE    . CATARACT EXTRACTION     right  . COLONOSCOPY  2007  . INGUINAL HERNIA REPAIR  10/14/2011   Procedure: HERNIA REPAIR INGUINAL ADULT;  Surgeon: [name], MD;  Location: [facility];  Service: General;  Laterality: Right;  Right Inguinal Hernia Repair with Mesh  . TONSILLECTOMY    . VARICOSE VEIN SURGERY      There were no vitals filed for this visit.      [facility] PT Assessment - 06/09/18 0001      Assessment   Medical Diagnosis  patellofemoral arthritis right knee     Referring Provider (PT)  Dr. Hulan Saas     Onset Date/Surgical Date  --   chronic     Precautions   Precautions  Fall      6 minute walk test results    Aerobic Endurance Distance Walked  450    Endurance  additional comments  with RW gait speed decreasing; reports 2 episodes of knee catching;  dizziness                   [facility] Adult PT Treatment/Exercise - 06/09/18 0001      Knee/Hip Exercises: Stretches   Other Knee/Hip Stretches  standing slant board 10x right/left   10 sec hold hold for gastroc     Knee/Hip Exercises: Aerobic   Nustep  seat 13 arms 11 L3 10 min    PT present to discuss progress- steady pace     Knee/Hip Exercises: Machines for Strengthening   Cybex Knee Extension  10# double leg 25x    Cybex Knee Flexion  30# bil 50x; 20# single leg 50x each     Cybex Leg Press  Seat 9: Bil 75# 100x, 80# 50x VC to slow eccentric portion: Single leg 40# 50x each       Knee/Hip Exercises: Standing   Heel Raises  Both;20 reps               PT Short Term Goals - 05/05/18 0848      PT SHORT TERM  GOAL #1   Title  be independent in initial HEP      Status  Achieved      PT SHORT TERM GOAL #2   Title  The patient will have improved right knee ROM 3-120 degrees needed for greater ease getting up and down from the chair and in/out of the car    Status  Achieved      PT SHORT TERM GOAL #3   Title  The patient will have improved hip flexor muscle length to 5 degrees and HS length to 65 degrees needed for improved stride length with ambulation    Status  Achieved      PT SHORT TERM GOAL #4   Title  Timed up and Go to 16 sec indicating improved gait safety    Status  Achieved      PT SHORT TERM GOAL #5   Title  Ten meter walk test to 14 sec indicate improved gait speed needed for community ambulation     Status  On-going        PT Long Term Goals - 06/09/18 0846      PT LONG TERM GOAL #1   Title  be independent in advanced HEP for further improvements in strength, ROM and balance      Baseline  independent in current HEP    Time  8    Period  Weeks    Status  On-going      PT LONG TERM GOAL #2   Title  Right knee ROM improved to 2-125 degrees needed  for greater mobility     Time  8    Period  Weeks    Status  On-going      PT LONG TERM GOAL #3   Title  Improved right knee extension strength to 4+/5 and knee flexion strength to 5-/5 needed for greater ease rising    Baseline  extension 4/5, flexion 4-/5    Time  8    Period  Weeks    Status  On-going      PT LONG TERM GOAL #4   Title  Improved knee pain with usual ADLs by 50%    Baseline  50-60%    Time  --    Period  --    Status  Achieved      PT LONG TERM GOAL #5   Title  The patient will be able to ambulate 500 feet in six minutes needed for shorter distance community ambulation for traveling    Baseline  450 feet    Time  8    Period  Weeks    Status  On-going            Plan - 06/09/18 0815    Clinical Impression Statement  Pt saw MD yesterday and he ordered an epidural that he will receive next Tuesday.  Pt continues to demonstrate chronic weakness and balance deficits.  Pt demonstrated fatigue with exercise today and required short intermittent rest breaks throughout the session.  Pt was able to tolerate increased reps with leg press.   Pt required monitoring for dizziness.  Pt will continue to benefit from skilled PT for strength, endurance and gait.      Rehab Potential  Good    Clinical Impairments Affecting Rehab Potential  risk for falls    PT Frequency  2x / week    PT Duration  8 weeks    PT Treatment/Interventions  ADLs/Self Care Home Management;Cryotherapy;Electrical Stimulation;Ultrasound;Moist  Heat;Iontophoresis 4mg /ml Dexamethasone;Gait training;Stair training;Therapeutic activities;Therapeutic exercise;Patient/family education;Neuromuscular re-education;Manual techniques;Taping;Dry needling    PT Next Visit Plan   KX;  Continue balance, strength and endurance; leg press.  Retest 10 m walk test.        PT Home Exercise Plan  Access Code: [email or web address]     Recommended Other Services  cert and recert are signed    Consulted and Agree with Plan of Care   Patient       Patient will benefit from skilled therapeutic intervention in order to improve the following deficits and impairments:  Pain, Decreased range of motion, Decreased strength, Decreased activity tolerance, Impaired perceived functional ability, Difficulty walking, Decreased balance  Visit Diagnosis: Chronic pain of right knee  Stiffness of right knee, not elsewhere classified  Muscle weakness (generalized)  Repeated falls     Problem List Patient Active Problem List   Diagnosis Date Noted  . Chronic left SI joint pain 04/27/2018  . Right wrist pain 02/05/2018  . Degenerative arthritis of right knee 10/23/2017  . Left leg cellulitis 08/12/2017  . Arthritis of right knee 05/20/2017  . Diverticulosis of colon with hemorrhage   . Acute blood loss anemia   . GIB (gastrointestinal bleeding) 03/12/2017  . Leg edema, right 02/20/2015  . BPH (benign prostatic hyperplasia) 12/09/2014  . Chronic radicular low back pain 08/06/2012  . COPD (chronic obstructive pulmonary disease) ([place]) 04/21/2012  . Syncope 03/29/2012  . Hyponatremia 03/29/2012  . Localized osteoarthrosis not specified whether primary or secondary, lower leg 06/20/2010  . VARICOSE VEINS LOWER EXTREMITIES W/INFLAMMATION 02/12/2010  . [facility] LOCALIZED OSTEOARTHROSIS INVOLVING HAND 10/16/2009  . DUPUYTREN'S CONTRACTURE, RIGHT 07/04/2009  . DEGENERATIVE DISC DISEASE, LUMBOSACRAL SPINE W/RADICULOPATHY 06/19/2009  . CARCINOMA, BASAL CELL 02/07/2009  . ANEMIA OF CHRONIC DISEASE 02/07/2009  . BURSITIS, RIGHT SHOULDER 10/07/2008  . CATARACT ASSOCIATED WITH OTHER SYNDROMES 08/01/2008  . DRY SKIN 08/01/2008  . CERUMEN IMPACTION, BILATERAL 03/01/2008  . PMR (polymyalgia rheumatica) ([facility]) 01/18/2008  . CRAMP IN LIMB 01/18/2008  . DEPRESSION, SITUATIONAL, ACUTE 12/28/2007  . CONSTIPATION, DRUG INDUCED 12/28/2007  . POLYNEUROPATHY OTHER DISEASES CLASSIFIED ELSW 09/24/2007  . WEIGHT LOSS, ABNORMAL 01/07/2007  .  Hyperlipidemia 09/22/2006  . Essential hypertension 09/22/2006  . GERD (gastroesophageal reflux disease) 09/22/2006  . Osteoarthritis 09/22/2006    Sigurd Sos, PT 06/09/18 8:50 AM  [facility] [address] [place], [place], [address] Phone: [phone]   Fax:  [phone]  Name: Casten Floren MRN: [id number] Date of Birth: [birth year]

## 2018-06-10 ENCOUNTER — Other Ambulatory Visit: Payer: Self-pay | Admitting: Family Medicine

## 2018-06-10 DIAGNOSIS — I10 Essential (primary) hypertension: Secondary | ICD-10-CM

## 2018-06-12 ENCOUNTER — Ambulatory Visit: Payer: Medicare Other | Admitting: Physical Therapy

## 2018-06-12 ENCOUNTER — Other Ambulatory Visit: Payer: Self-pay | Admitting: Family Medicine

## 2018-06-16 ENCOUNTER — Ambulatory Visit: Payer: Medicare Other | Admitting: Physical Therapy

## 2018-06-16 ENCOUNTER — Other Ambulatory Visit: Payer: Medicare Other

## 2018-06-19 ENCOUNTER — Ambulatory Visit: Payer: Medicare Other | Admitting: Physical Therapy

## 2018-06-23 ENCOUNTER — Ambulatory Visit: Payer: Medicare Other | Admitting: Physical Therapy

## 2018-06-26 ENCOUNTER — Encounter: Payer: Self-pay | Admitting: Podiatry

## 2018-06-26 ENCOUNTER — Other Ambulatory Visit: Payer: Self-pay

## 2018-06-26 ENCOUNTER — Ambulatory Visit (INDEPENDENT_AMBULATORY_CARE_PROVIDER_SITE_OTHER): Payer: Medicare Other | Admitting: Podiatry

## 2018-06-26 VITALS — Temp 97.7°F

## 2018-06-26 DIAGNOSIS — M79674 Pain in right toe(s): Secondary | ICD-10-CM

## 2018-06-26 DIAGNOSIS — B351 Tinea unguium: Secondary | ICD-10-CM

## 2018-06-26 DIAGNOSIS — M79675 Pain in left toe(s): Secondary | ICD-10-CM

## 2018-06-26 DIAGNOSIS — G609 Hereditary and idiopathic neuropathy, unspecified: Secondary | ICD-10-CM

## 2018-06-26 NOTE — Progress Notes (Signed)
Complaint:  Visit Type: Patient returns to my office for continued preventative foot care services. Complaint: Patient states" my nails have grown long and thick and become painful to walk and wear shoes" Patient has been diagnosed with neuropathy.. The patient presents for preventative foot care services. No changes to ROS.  Patient says his feet have improved wearing his powerstep insoles.  Podiatric Exam: Vascular: dorsalis pedis and posterior tibial pulses are palpable bilateral. Capillary return is immediate. Temperature gradient is WNL. Skin turgor WNL  Sensorium: Normal Semmes Weinstein monofilament test. Normal tactile sensation bilaterally. Nail Exam: Pt has thick disfigured discolored nails with subungual debris noted bilateral entire nail hallux through fifth toenails Ulcer Exam: There is no evidence of ulcer or pre-ulcerative changes or infection. Orthopedic Exam: Muscle tone and strength are WNL. No limitations in general ROM. No crepitus or effusions noted. Foot type and digits show no abnormalities. Bony prominences are unremarkable. Skin: No Porokeratosis. No infection or ulcers  Diagnosis:  Onychomycosis, , Pain in right toe, pain in left toes.    Treatment & Plan Procedures and Treatment: Consent by patient was obtained for treatment procedures. The patient understood the discussion of treatment and procedures well. All questions were answered thoroughly reviewed. Debridement of mycotic and hypertrophic toenails, 1 through 5 bilateral and clearing of subungual debris. No ulceration, no infection noted.  Return Visit-Office Procedure: Patient instructed to return to the office for a follow up visit 3 months for continued evaluation and treatment.    Gardiner Barefoot DPM

## 2018-07-20 ENCOUNTER — Ambulatory Visit (INDEPENDENT_AMBULATORY_CARE_PROVIDER_SITE_OTHER): Payer: Medicare Other | Admitting: Family Medicine

## 2018-07-20 ENCOUNTER — Encounter: Payer: Self-pay | Admitting: Family Medicine

## 2018-07-20 ENCOUNTER — Other Ambulatory Visit: Payer: Self-pay

## 2018-07-20 VITALS — BP 120/64 | HR 90 | Temp 98.1°F | Ht 73.0 in | Wt 198.7 lb

## 2018-07-20 DIAGNOSIS — H6121 Impacted cerumen, right ear: Secondary | ICD-10-CM

## 2018-07-20 DIAGNOSIS — M67449 Ganglion, unspecified hand: Secondary | ICD-10-CM

## 2018-07-20 NOTE — Patient Instructions (Signed)
Keep finger clean with soap and water  Let me know if finger lesion not healing over the next 3-4 weeks.

## 2018-07-20 NOTE — Progress Notes (Signed)
  Subjective:     Patient ID: Darin [name], male   DOB: 1932/03/16, 85 y.o.   MRN: [id number]  HPI Patient seen today for the following issues  Concern for possible wax buildup in the ears.  He has had similar problems in the past.  No hearing loss.  No drainage.  No ear pain.  About a month ago noticed what he described as a clear blister right middle finger.  He describes some clear looking fluid dorsally just proximal to the nail fold area.  Nonpainful.  No injury  Past Medical History:  Diagnosis Date  . Arthritis   . GERD (gastroesophageal reflux disease)   . H/O hiatal hernia    "FOR YEARS" " NO PROBLEM"  . Hyperlipidemia   . Hypertension   . Knee pain   . Neuropathy    PERIPHERAL   . Osteoporosis   . PMR (polymyalgia rheumatica) (HCC)   . Polymyalgia ([place]) 5 YEARS   Past Surgical History:  Procedure Laterality Date  . CARPAL TUNNEL RELEASE    . CATARACT EXTRACTION     right  . COLONOSCOPY  2007  . INGUINAL HERNIA REPAIR  10/14/2011   Procedure: HERNIA REPAIR INGUINAL ADULT;  Surgeon: [name], MD;  Location: [facility];  Service: General;  Laterality: Right;  Right Inguinal Hernia Repair with Mesh  . TONSILLECTOMY    . VARICOSE VEIN SURGERY      reports that he has never smoked. He has never used smokeless tobacco. He reports current alcohol use of about 3.0 standard drinks of alcohol per week. He reports that he does not use drugs. family history includes Coronary artery disease in an other family member; Heart disease in his father. Allergies  Allergen Reactions  . Statins Other (See Comments)    Muscle pain and upset stomach     Review of Systems  Constitutional: Negative for chills and fever.  HENT: Negative for ear discharge, ear pain and hearing loss.        Objective:   Physical Exam Constitutional:      Appearance: Normal appearance. He is not ill-appearing or toxic-appearing.  HENT:     Ears:     Comments: Left canal is clear.  He has  cerumen impaction right canal Cardiovascular:     Rate and Rhythm: Normal rate and regular rhythm.  Skin:    Comments: Right middle finger dorsally just proximal to the nail fold area he has approximately 2 x 3 mm area of denuded epithelium with slightly dry base.  No nodular growth.  No necrosis.  Neurological:     Mental Status: He is alert.        Assessment:     #1 cerumen impaction right canal  #2 probable digital mucinous cyst right middle finger    Plan:     -Nurse irrigated right canal without difficulty -We recommend try to keep finger clean and consider some topical Vaseline to prevent drying and cracking.  Touch base if this is not healing further in 1 month.  This does have benign appearance but consider biopsy if not healing in 1 month  Darin Post MD [facility]

## 2018-07-23 ENCOUNTER — Other Ambulatory Visit: Payer: Self-pay

## 2018-07-23 ENCOUNTER — Encounter: Payer: Self-pay | Admitting: Physical Therapy

## 2018-07-23 ENCOUNTER — Ambulatory Visit: Payer: Medicare Other | Attending: Family Medicine | Admitting: Physical Therapy

## 2018-07-23 DIAGNOSIS — R296 Repeated falls: Secondary | ICD-10-CM | POA: Diagnosis not present

## 2018-07-23 DIAGNOSIS — G8929 Other chronic pain: Secondary | ICD-10-CM | POA: Diagnosis not present

## 2018-07-23 DIAGNOSIS — M25661 Stiffness of right knee, not elsewhere classified: Secondary | ICD-10-CM | POA: Diagnosis not present

## 2018-07-23 DIAGNOSIS — M6281 Muscle weakness (generalized): Secondary | ICD-10-CM | POA: Insufficient documentation

## 2018-07-23 DIAGNOSIS — M25561 Pain in right knee: Secondary | ICD-10-CM | POA: Diagnosis not present

## 2018-07-23 NOTE — Therapy (Addendum)
[facility] [address] [place], [place], [address] Phone: [phone]   Fax:  [phone]  Physical Therapy Treatment/ Recertification  Patient Details  Name: Darin [name] MRN: [id number] Date of Birth: 1931/10/30 Referring Provider (PT): Dr. Hulan Saas    Encounter Date: 07/23/2018  PT End of Session - 07/23/18 0814    Visit Number  21    Date for PT Re-Evaluation  09/17/18    Authorization Type  Medicare     PT Start Time  0756    PT Stop Time  0844    PT Time Calculation (min)  48 min    Activity Tolerance  Patient tolerated treatment well       Past Medical History:  Diagnosis Date  . Arthritis   . GERD (gastroesophageal reflux disease)   . H/O hiatal hernia    "FOR YEARS" " NO PROBLEM"  . Hyperlipidemia   . Hypertension   . Knee pain   . Neuropathy    PERIPHERAL   . Osteoporosis   . PMR (polymyalgia rheumatica) (HCC)   . Polymyalgia ([place]) 5 YEARS    Past Surgical History:  Procedure Laterality Date  . CARPAL TUNNEL RELEASE    . CATARACT EXTRACTION     right  . COLONOSCOPY  2007  . INGUINAL HERNIA REPAIR  10/14/2011   Procedure: HERNIA REPAIR INGUINAL ADULT;  Surgeon: [name], MD;  Location: [facility];  Service: General;  Laterality: Right;  Right Inguinal Hernia Repair with Mesh  . TONSILLECTOMY    . VARICOSE VEIN SURGERY      There were no vitals filed for this visit.  Subjective Assessment - 07/23/18 0755    Subjective  Patient presents with RW.  I have my ups and downs.  My left leg muscle is in contant pain and I don't know when my right leg is going to give out on me.  It can be every day or it can be several days in between.   I was supposed to have an ESI but rescheduled for mid May.  I have a couple of protruding discs.  I think I have regressed physically.  I can't walk as far and I tired easily.  I'm sleeping more.  Denies recent falls.    Pertinent History  MD follow up  in July;  Hoping to go to [place] in July    How long can you stand comfortably?  I shave with right hand and brace counter with left    How long can you walk comfortably?  with RW to the mailbox 100 feet each way    Patient Stated Goals  I would  be able to walk with a cane but I'm not sure that's going to happen     Currently in Pain?  Yes    Pain Score  6     Pain Location  Hip    Pain Orientation  Left    Pain Type  Chronic pain         [facility] PT Assessment - 07/23/18 0001      Assessment   Medical Diagnosis  patellofemoral arthritis right knee     Referring Provider (PT)  Dr. Hulan Saas       AROM   Right Knee Extension  5    Right Knee Flexion  115    Left Knee Extension  0    Left Knee Flexion  130      Strength  Right Hip Flexion  4/5    Right Knee Flexion  4-/5    Right Knee Extension  4/5    Left Knee Flexion  4+/5    Left Knee Extension  4-/5      Ambulation/Gait   Gait Comments  head down, wide base of support, shuffled steps      Standardized Balance Assessment   Five times sit to stand comments   15.57   standard chair   10 Meter Walk  16.75   with RW     Timed Up and Go Test   Normal TUG (seconds)  17.66    TUG Comments  with RW                   [facility] Adult PT Treatment/Exercise - 07/23/18 0001      Knee/Hip Exercises: Aerobic   Nustep  seat 13 arms 11 L3 10 min    PT present to discuss progress- steady pace     Knee/Hip Exercises: Machines for Strengthening   Cybex Knee Flexion  30# bil 30x    Cybex Leg Press  Seat 9: Bil 75# 25x2 Single leg 40# 30x each                PT Short Term Goals - 07/23/18 0856      PT SHORT TERM GOAL #1   Title  be independent in initial HEP      Status  Achieved      PT SHORT TERM GOAL #2   Title  The patient will have improved right knee ROM 3-120 degrees needed for greater ease getting up and down from the chair and in/out of the car    Status  Achieved      PT SHORT TERM  GOAL #3   Title  The patient will have improved hip flexor muscle length to 5 degrees and HS length to 65 degrees needed for improved stride length with ambulation    Status  Achieved      PT SHORT TERM GOAL #4   Title  Timed up and Go to 16 sec indicating improved gait safety    Status  Achieved      PT SHORT TERM GOAL #5   Title  Ten meter walk test to 14 sec indicate improved gait speed needed for community ambulation     Time  4    Period  Weeks    Status  On-going    Target Date  09/17/18        PT Long Term Goals - 07/23/18 0838      PT LONG TERM GOAL #1   Title  be independent in advanced HEP for further improvements in strength, ROM and balance      Time  8    Period  Weeks    Status  On-going    Target Date  09/17/18      PT LONG TERM GOAL #2   Title  Right knee ROM improved to 2-125 degrees needed for greater mobility     Time  8    Status  On-going      PT LONG TERM GOAL #3   Title  Improved right knee extension strength to 4+/5 and knee flexion strength to 5-/5 needed for greater ease rising    Time  8    Period  Weeks    Status  On-going      PT LONG TERM GOAL #4   Title  The patient will be able to walk short distances 50 feet or less with 2 canes/poles    Status  New      PT LONG TERM GOAL #5   Title  The patient will be able to ambulate 500 feet in six minutes with RW needed for shorter distance community ambulation for traveling    Time  8    Period  Weeks    Status  Revised            Plan - 07/23/18 9629    Clinical Impression Statement  The patient returns after facility closure due to the coronavirus.  He reports decreased mobility secondary to inactivity.  His max ambulation has been walking to the mailbox 100 feet each way.   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  He presents with the RW with continued head down, wide base of support shuffled  gait pattern.  He complains of limited standing tolerance due to left hip/leg pain and right knee give way randomly.  Decreased gait speed and continued risk of falls.  He would benefit from continued PT to prevent a further decline in mobility and functional status.  He is able to perform LE strengthening in addition to functional testing however fewer repetitions tolerated secondary to muscular and general fatigue.      Rehab Potential  Good    Clinical Impairments Affecting Rehab Potential  risk for falls    PT Frequency  2x / week    PT Duration  8 weeks    PT Treatment/Interventions  ADLs/Self Care Home Management;Cryotherapy;Electrical Stimulation;Ultrasound;Moist Heat;Iontophoresis 4mg /ml Dexamethasone;Gait training;Stair training;Therapeutic activities;Therapeutic exercise;Patient/family education;Neuromuscular re-education;Manual techniques;Taping;Dry needling    PT Next Visit Plan   KX;  Continue balance, strength and endurance; leg press;  knee extension and flexion strengthening;  recheck 6 MWT;  gait progression as able for patient goal of walking with a cane    PT Home Exercise Plan  Access Code: [email or web address]        Patient will benefit from skilled therapeutic intervention in order to improve the following deficits and impairments:  Pain, Decreased range of motion, Decreased strength, Decreased activity tolerance, Impaired perceived functional ability, Difficulty walking, Decreased balance  Visit Diagnosis: Chronic pain of right knee - Plan: PT plan of care cert/re-cert  Stiffness of right knee, not elsewhere classified - Plan: PT plan of care cert/re-cert  Muscle weakness (generalized) - Plan: PT plan of care cert/re-cert  Repeated falls - Plan: PT plan of care cert/re-cert  The patient has been informed of current processes in place at Outpatient Rehab to protect patients from Covid-19 exposure including social distancing, schedule modifications, and new cleaning procedures. After  discussing their particular risk with a therapist based on the patient's personal risk factors, the patient has decided to proceed with in-person therapy.   Problem List Patient Active Problem List   Diagnosis Date Noted  . Chronic left SI joint pain 04/27/2018  . Right wrist pain 02/05/2018  . Degenerative arthritis of right knee 10/23/2017  . Left leg cellulitis 08/12/2017  . Arthritis of right knee 05/20/2017  . Diverticulosis of colon with hemorrhage   . Acute blood loss anemia   . GIB (gastrointestinal bleeding) 03/12/2017  . Leg edema, right 02/20/2015  . BPH (benign prostatic hyperplasia) 12/09/2014  . Chronic radicular low back pain 08/06/2012  . COPD (chronic obstructive pulmonary disease) ([place]) 04/21/2012  . Syncope  03/29/2012  . Hyponatremia 03/29/2012  . Localized osteoarthrosis not specified whether primary or secondary, lower leg 06/20/2010  . VARICOSE VEINS LOWER EXTREMITIES W/INFLAMMATION 02/12/2010  . [place] LOCALIZED OSTEOARTHROSIS INVOLVING HAND 10/16/2009  . DUPUYTREN'S CONTRACTURE, RIGHT 07/04/2009  . DEGENERATIVE DISC DISEASE, LUMBOSACRAL SPINE W/RADICULOPATHY 06/19/2009  . CARCINOMA, BASAL CELL 02/07/2009  . ANEMIA OF CHRONIC DISEASE 02/07/2009  . BURSITIS, RIGHT SHOULDER 10/07/2008  . CATARACT ASSOCIATED WITH OTHER SYNDROMES 08/01/2008  . DRY SKIN 08/01/2008  . CERUMEN IMPACTION, BILATERAL 03/01/2008  . PMR (polymyalgia rheumatica) ([place]) 01/18/2008  . CRAMP IN LIMB 01/18/2008  . DEPRESSION, SITUATIONAL, ACUTE 12/28/2007  . CONSTIPATION, DRUG INDUCED 12/28/2007  . POLYNEUROPATHY OTHER DISEASES CLASSIFIED ELSW 09/24/2007  . WEIGHT LOSS, ABNORMAL 01/07/2007  . Hyperlipidemia 09/22/2006  . Essential hypertension 09/22/2006  . GERD (gastroesophageal reflux disease) 09/22/2006  . Osteoarthritis 09/22/2006   Ruben Im, PT 07/23/18 11:17 AM Phone: [phone] Fax: [phone] Alvera Singh 07/23/2018, 11:17 AM  [facility] [address] [place], [place], [address] Phone: [phone]   Fax:  [phone]  Name: Oran Dillenburg MRN: [id number] Date of Birth: 05/24/1931

## 2018-08-03 ENCOUNTER — Encounter: Payer: Self-pay | Admitting: Physical Therapy

## 2018-08-03 ENCOUNTER — Other Ambulatory Visit: Payer: Self-pay

## 2018-08-03 ENCOUNTER — Ambulatory Visit: Payer: Medicare Other | Attending: Family Medicine | Admitting: Physical Therapy

## 2018-08-03 DIAGNOSIS — M25561 Pain in right knee: Secondary | ICD-10-CM | POA: Diagnosis not present

## 2018-08-03 DIAGNOSIS — R296 Repeated falls: Secondary | ICD-10-CM | POA: Diagnosis not present

## 2018-08-03 DIAGNOSIS — R279 Unspecified lack of coordination: Secondary | ICD-10-CM | POA: Insufficient documentation

## 2018-08-03 DIAGNOSIS — M25661 Stiffness of right knee, not elsewhere classified: Secondary | ICD-10-CM | POA: Diagnosis not present

## 2018-08-03 DIAGNOSIS — M25552 Pain in left hip: Secondary | ICD-10-CM | POA: Diagnosis not present

## 2018-08-03 DIAGNOSIS — M6281 Muscle weakness (generalized): Secondary | ICD-10-CM | POA: Diagnosis not present

## 2018-08-03 DIAGNOSIS — R262 Difficulty in walking, not elsewhere classified: Secondary | ICD-10-CM | POA: Diagnosis not present

## 2018-08-03 DIAGNOSIS — G8929 Other chronic pain: Secondary | ICD-10-CM

## 2018-08-03 NOTE — Therapy (Signed)
[facility] [address] [place], [place], [address] Phone: [phone]   Fax:  [phone]  Physical Therapy Treatment  Patient Details  Name: Princeton Nabor MRN: [id number] Date of Birth: 13-Jul-1931 Referring Provider (PT): Dr. Hulan Saas    Encounter Date: 08/03/2018  PT End of Session - 08/03/18 0817    Visit Number  22    Date for PT Re-Evaluation  09/17/18    Authorization Type  Medicare     PT Start Time  0815    PT Stop Time  0904    PT Time Calculation (min)  49 min    Activity Tolerance  Patient tolerated treatment well    Behavior During Therapy  [facility] for tasks assessed/performed       Past Medical History:  Diagnosis Date  . Arthritis   . GERD (gastroesophageal reflux disease)   . H/O hiatal hernia    "FOR YEARS" " NO PROBLEM"  . Hyperlipidemia   . Hypertension   . Knee pain   . Neuropathy    PERIPHERAL   . Osteoporosis   . PMR (polymyalgia rheumatica) (HCC)   . Polymyalgia ([place]) 5 YEARS    Past Surgical History:  Procedure Laterality Date  . CARPAL TUNNEL RELEASE    . CATARACT EXTRACTION     right  . COLONOSCOPY  2007  . INGUINAL HERNIA REPAIR  10/14/2011   Procedure: HERNIA REPAIR INGUINAL ADULT;  Surgeon: [name], MD;  Location: [facility];  Service: General;  Laterality: Right;  Right Inguinal Hernia Repair with Mesh  . TONSILLECTOMY    . VARICOSE VEIN SURGERY      There were no vitals filed for this visit.  Subjective Assessment - 08/03/18 0819    Subjective  I am keeping up with my homework pretty good. I feel pretty good this AM.     Pertinent History  MD follow up in July;  Hoping to go to [place] in July    Currently in Pain?  Yes    Pain Score  6     Pain Location  Hip    Pain Orientation  Left    Pain Descriptors / Indicators  Sore    Aggravating Factors   Standing activities    Pain Relieving Factors  rest, sitting    Multiple Pain Sites  No          [facility] PT Assessment - 08/03/18 0001      6 minute walk test results    Aerobic Endurance Distance Walked  480   RW                  [facility] Adult PT Treatment/Exercise - 08/03/18 0001      Ambulation/Gait   Ambulation/Gait  Yes    Ambulation/Gait Assistance  6: Modified independent (Device/Increase time)    Ambulation Distance (Feet)  40 Feet    Assistive device  Rolling walker    Gait Comments  Vc for increased step and velocity      Knee/Hip Exercises: Stretches   Active Hamstring Stretch  Both;4 reps;10 seconds   VC for lengthening his spine more and using support   Gastroc Stretch  Both;5 reps;10 seconds   On slant board, VC to hold stretches     Knee/Hip Exercises: Aerobic   Nustep  seat 13 arms 11 L3 10 min    PTA present to discuss progress- steady pace     Knee/Hip Exercises: Machines for Strengthening  Cybex Knee Extension  25# bil 20x     Cybex Knee Flexion  30# 50x2   Vc to control eccentric vs "riding the resistance up."   Cybex Leg Press  Seat 9: Bil 75# 25x2 , Single leg 40# 40x each   Pt felt curren weight was appropriate              PT Short Term Goals - 07/23/18 0856      PT SHORT TERM GOAL #1   Title  be independent in initial HEP      Status  Achieved      PT SHORT TERM GOAL #2   Title  The patient will have improved right knee ROM 3-120 degrees needed for greater ease getting up and down from the chair and in/out of the car    Status  Achieved      PT SHORT TERM GOAL #3   Title  The patient will have improved hip flexor muscle length to 5 degrees and HS length to 65 degrees needed for improved stride length with ambulation    Status  Achieved      PT SHORT TERM GOAL #4   Title  Timed up and Go to 16 sec indicating improved gait safety    Status  Achieved      PT SHORT TERM GOAL #5   Title  Ten meter walk test to 14 sec indicate improved gait speed needed for community ambulation     Time  4    Period  Weeks     Status  On-going    Target Date  09/17/18        PT Long Term Goals - 07/23/18 0838      PT LONG TERM GOAL #1   Title  be independent in advanced HEP for further improvements in strength, ROM and balance      Time  8    Period  Weeks    Status  On-going    Target Date  09/17/18      PT LONG TERM GOAL #2   Title  Right knee ROM improved to 2-125 degrees needed for greater mobility     Time  8    Status  On-going      PT LONG TERM GOAL #3   Title  Improved right knee extension strength to 4+/5 and knee flexion strength to 5-/5 needed for greater ease rising    Time  8    Period  Weeks    Status  On-going      PT LONG TERM GOAL #4   Title  The patient will be able to walk short distances 50 feet or less with 2 canes/poles    Status  New      PT LONG TERM GOAL #5   Title  The patient will be able to ambulate 500 feet in six minutes with RW needed for shorter distance community ambulation for traveling    Time  8    Period  Weeks    Status  Revised            Plan - 08/03/18 0818    Clinical Impression Statement  Pt had no adverse effects from his return over a week ago. He demonstrated good eccentric control on the leg press today. Pt elected to keep his resisted the same as he feels it is appropriately challenging.  He improved his 6 min walk test by 30 feet today, progressing towards goal of 500  feet. Pt was able to show improvement in his step length and keep his head up some but he required regular verbal cuing for this.     Rehab Potential  Good    Clinical Impairments Affecting Rehab Potential  risk for falls    PT Frequency  2x / week    PT Duration  8 weeks    PT Treatment/Interventions  ADLs/Self Care Home Management;Cryotherapy;Electrical Stimulation;Ultrasound;Moist Heat;Iontophoresis 4mg /ml Dexamethasone;Gait training;Stair training;Therapeutic activities;Therapeutic exercise;Patient/family education;Neuromuscular re-education;Manual techniques;Taping;Dry  needling    PT Home Exercise Plan  Access Code: [email or web address]     Consulted and Agree with Plan of Care  Patient       Patient will benefit from skilled therapeutic intervention in order to improve the following deficits and impairments:  Pain, Decreased range of motion, Decreased strength, Decreased activity tolerance, Impaired perceived functional ability, Difficulty walking, Decreased balance  Visit Diagnosis: Chronic pain of right knee  Stiffness of right knee, not elsewhere classified  Muscle weakness (generalized)  Repeated falls     Problem List Patient Active Problem List   Diagnosis Date Noted  . Chronic left SI joint pain 04/27/2018  . Right wrist pain 02/05/2018  . Degenerative arthritis of right knee 10/23/2017  . Left leg cellulitis 08/12/2017  . Arthritis of right knee 05/20/2017  . Diverticulosis of colon with hemorrhage   . Acute blood loss anemia   . GIB (gastrointestinal bleeding) 03/12/2017  . Leg edema, right 02/20/2015  . BPH (benign prostatic hyperplasia) 12/09/2014  . Chronic radicular low back pain 08/06/2012  . COPD (chronic obstructive pulmonary disease) ([place]) 04/21/2012  . Syncope 03/29/2012  . Hyponatremia 03/29/2012  . Localized osteoarthrosis not specified whether primary or secondary, lower leg 06/20/2010  . VARICOSE VEINS LOWER EXTREMITIES W/INFLAMMATION 02/12/2010  . [place] LOCALIZED OSTEOARTHROSIS INVOLVING HAND 10/16/2009  . DUPUYTREN'S CONTRACTURE, RIGHT 07/04/2009  . DEGENERATIVE DISC DISEASE, LUMBOSACRAL SPINE W/RADICULOPATHY 06/19/2009  . CARCINOMA, BASAL CELL 02/07/2009  . ANEMIA OF CHRONIC DISEASE 02/07/2009  . BURSITIS, RIGHT SHOULDER 10/07/2008  . CATARACT ASSOCIATED WITH OTHER SYNDROMES 08/01/2008  . DRY SKIN 08/01/2008  . CERUMEN IMPACTION, BILATERAL 03/01/2008  . PMR (polymyalgia rheumatica) ([place]) 01/18/2008  . CRAMP IN LIMB 01/18/2008  . DEPRESSION, SITUATIONAL, ACUTE 12/28/2007  . CONSTIPATION, DRUG INDUCED 12/28/2007  .  POLYNEUROPATHY OTHER DISEASES CLASSIFIED ELSW 09/24/2007  . WEIGHT LOSS, ABNORMAL 01/07/2007  . Hyperlipidemia 09/22/2006  . Essential hypertension 09/22/2006  . GERD (gastroesophageal reflux disease) 09/22/2006  . Osteoarthritis 09/22/2006    Delmon Andrada, PTA 08/03/2018, 9:05 AM  [facility] [address], [place], [place], [address] Phone: [phone]   Fax:  [phone]  Name: Esequiel Kleinfelter MRN: [id number] Date of Birth: December 18, 1931

## 2018-08-05 ENCOUNTER — Encounter: Payer: Self-pay | Admitting: Physical Therapy

## 2018-08-05 ENCOUNTER — Other Ambulatory Visit: Payer: Self-pay

## 2018-08-05 ENCOUNTER — Ambulatory Visit: Payer: Medicare Other | Admitting: Physical Therapy

## 2018-08-05 DIAGNOSIS — G8929 Other chronic pain: Secondary | ICD-10-CM | POA: Diagnosis not present

## 2018-08-05 DIAGNOSIS — M25661 Stiffness of right knee, not elsewhere classified: Secondary | ICD-10-CM

## 2018-08-05 DIAGNOSIS — R296 Repeated falls: Secondary | ICD-10-CM

## 2018-08-05 DIAGNOSIS — M25552 Pain in left hip: Secondary | ICD-10-CM

## 2018-08-05 DIAGNOSIS — R262 Difficulty in walking, not elsewhere classified: Secondary | ICD-10-CM

## 2018-08-05 DIAGNOSIS — M6281 Muscle weakness (generalized): Secondary | ICD-10-CM | POA: Diagnosis not present

## 2018-08-05 DIAGNOSIS — R279 Unspecified lack of coordination: Secondary | ICD-10-CM | POA: Diagnosis not present

## 2018-08-05 DIAGNOSIS — M25561 Pain in right knee: Secondary | ICD-10-CM

## 2018-08-05 NOTE — Therapy (Signed)
[facility] [address] [place], [place], [address] Phone: [phone]   Fax:  [phone]  Physical Therapy Treatment  Patient Details  Name: Darin [name] MRN: [id number] Date of Birth: June 29, 1931 Referring Provider (PT): Dr. Hulan Saas    Encounter Date: 08/05/2018  PT End of Session - 08/05/18 0817    Visit Number  23    Date for PT Re-Evaluation  09/17/18    Authorization Type  Medicare     PT Start Time  0815    PT Stop Time  0855    PT Time Calculation (min)  40 min    Activity Tolerance  Patient tolerated treatment well    Behavior During Therapy  [facility] for tasks assessed/performed       Past Medical History:  Diagnosis Date  . Arthritis   . GERD (gastroesophageal reflux disease)   . H/O hiatal hernia    "FOR YEARS" " NO PROBLEM"  . Hyperlipidemia   . Hypertension   . Knee pain   . Neuropathy    PERIPHERAL   . Osteoporosis   . PMR (polymyalgia rheumatica) (HCC)   . Polymyalgia ([place]) 5 YEARS    Past Surgical History:  Procedure Laterality Date  . CARPAL TUNNEL RELEASE    . CATARACT EXTRACTION     right  . COLONOSCOPY  2007  . INGUINAL HERNIA REPAIR  10/14/2011   Procedure: HERNIA REPAIR INGUINAL ADULT;  Surgeon: [name], MD;  Location: [place];  Service: General;  Laterality: Right;  Right Inguinal Hernia Repair with Mesh  . TONSILLECTOMY    . VARICOSE VEIN SURGERY      There were no vitals filed for this visit.  Subjective Assessment - 08/05/18 0818    Subjective  Felt fine after the last session. Yesterday my Lt knee gave out on me several times. No falls.    Pertinent History  MD follow up in July;  Hoping to go to [place] in July    Currently in Pain?  Yes    Pain Score  6     Pain Location  Hip    Pain Orientation  Left    Multiple Pain Sites  No                       [facility] Adult PT Treatment/Exercise - 08/05/18 0001      Knee/Hip Exercises:  Stretches   Active Hamstring Stretch  Both;4 reps;10 seconds   VC for lengthening his spine more and using support   Hip Flexor Stretch  --   On steps bil 3x 10 sec, bil hand rails for safety   Gastroc Stretch  Both;5 reps;10 seconds   On slant board, VC to hold stretches     Knee/Hip Exercises: Aerobic   Nustep  seat 13 arms 11 L3 10 min    PTA present to discuss progress- steady pace     Knee/Hip Exercises: Machines for Strengthening   Cybex Knee Extension  25# bil 20x     Cybex Knee Flexion  30# 50x2   Vc to control eccentric vs "riding the resistance up."   Cybex Leg Press  Seat 9 Bil 75# x25, 80# 25x: 40# Single leg each 2x 25    VC to slow eccentric              PT Short Term Goals - 07/23/18 0856      PT SHORT TERM GOAL #1  Title  be independent in initial HEP      Status  Achieved      PT SHORT TERM GOAL #2   Title  The patient will have improved right knee ROM 3-120 degrees needed for greater ease getting up and down from the chair and in/out of the car    Status  Achieved      PT SHORT TERM GOAL #3   Title  The patient will have improved hip flexor muscle length to 5 degrees and HS length to 65 degrees needed for improved stride length with ambulation    Status  Achieved      PT SHORT TERM GOAL #4   Title  Timed up and Go to 16 sec indicating improved gait safety    Status  Achieved      PT SHORT TERM GOAL #5   Title  Ten meter walk test to 14 sec indicate improved gait speed needed for community ambulation     Time  4    Period  Weeks    Status  On-going    Target Date  09/17/18        PT Long Term Goals - 07/23/18 0838      PT LONG TERM GOAL #1   Title  be independent in advanced HEP for further improvements in strength, ROM and balance      Time  8    Period  Weeks    Status  On-going    Target Date  09/17/18      PT LONG TERM GOAL #2   Title  Right knee ROM improved to 2-125 degrees needed for greater mobility     Time  8    Status   On-going      PT LONG TERM GOAL #3   Title  Improved right knee extension strength to 4+/5 and knee flexion strength to 5-/5 needed for greater ease rising    Time  8    Period  Weeks    Status  On-going      PT LONG TERM GOAL #4   Title  The patient will be able to walk short distances 50 feet or less with 2 canes/poles    Status  New      PT LONG TERM GOAL #5   Title  The patient will be able to ambulate 500 feet in six minutes with RW needed for shorter distance community ambulation for traveling    Time  8    Period  Weeks    Status  Revised            Plan - 08/05/18 0825    Clinical Impression Statement  Pt reports no adverse effects from his strengthening routines here in the clinic. He continues to report Lt knee giving away. During the session knee was audibly clunky.  Pt was able to increase leg press resistance slightly but open chain leg press and extension were kept the same. Discussed the use of his knee brace. He reports it is frustrating as it falls down often     Rehab Potential  Good    Clinical Impairments Affecting Rehab Potential  risk for falls    PT Frequency  2x / week    PT Duration  8 weeks    PT Treatment/Interventions  ADLs/Self Care Home Management;Cryotherapy;Electrical Stimulation;Ultrasound;Moist Heat;Iontophoresis 4mg /ml Dexamethasone;Gait training;Stair training;Therapeutic activities;Therapeutic exercise;Patient/family education;Neuromuscular re-education;Manual techniques;Taping;Dry needling    PT Next Visit Plan   KX;  Continue balance, strength  and endurance; leg press;  knee extension and flexion strengthening; gait progression as able for patient goal of walking with a cane    PT Home Exercise Plan  Access Code: [email or web address]     Consulted and Agree with Plan of Care  Patient       Patient will benefit from skilled therapeutic intervention in order to improve the following deficits and impairments:  Pain, Decreased range of motion, Decreased  strength, Decreased activity tolerance, Impaired perceived functional ability, Difficulty walking, Decreased balance  Visit Diagnosis: Chronic pain of right knee  Stiffness of right knee, not elsewhere classified  Muscle weakness (generalized)  Repeated falls  Unspecified lack of coordination  Difficulty in walking, not elsewhere classified  Pain in left hip     Problem List Patient Active Problem List   Diagnosis Date Noted  . Chronic left SI joint pain 04/27/2018  . Right wrist pain 02/05/2018  . Degenerative arthritis of right knee 10/23/2017  . Left leg cellulitis 08/12/2017  . Arthritis of right knee 05/20/2017  . Diverticulosis of colon with hemorrhage   . Acute blood loss anemia   . GIB (gastrointestinal bleeding) 03/12/2017  . Leg edema, right 02/20/2015  . BPH (benign prostatic hyperplasia) 12/09/2014  . Chronic radicular low back pain 08/06/2012  . COPD (chronic obstructive pulmonary disease) ([place]) 04/21/2012  . Syncope 03/29/2012  . Hyponatremia 03/29/2012  . Localized osteoarthrosis not specified whether primary or secondary, lower leg 06/20/2010  . VARICOSE VEINS LOWER EXTREMITIES W/INFLAMMATION 02/12/2010  . [place] LOCALIZED OSTEOARTHROSIS INVOLVING HAND 10/16/2009  . DUPUYTREN'S CONTRACTURE, RIGHT 07/04/2009  . DEGENERATIVE DISC DISEASE, LUMBOSACRAL SPINE W/RADICULOPATHY 06/19/2009  . CARCINOMA, BASAL CELL 02/07/2009  . ANEMIA OF CHRONIC DISEASE 02/07/2009  . BURSITIS, RIGHT SHOULDER 10/07/2008  . CATARACT ASSOCIATED WITH OTHER SYNDROMES 08/01/2008  . DRY SKIN 08/01/2008  . CERUMEN IMPACTION, BILATERAL 03/01/2008  . PMR (polymyalgia rheumatica) ([place]) 01/18/2008  . CRAMP IN LIMB 01/18/2008  . DEPRESSION, SITUATIONAL, ACUTE 12/28/2007  . CONSTIPATION, DRUG INDUCED 12/28/2007  . POLYNEUROPATHY OTHER DISEASES CLASSIFIED ELSW 09/24/2007  . WEIGHT LOSS, ABNORMAL 01/07/2007  . Hyperlipidemia 09/22/2006  . Essential hypertension 09/22/2006  . GERD  (gastroesophageal reflux disease) 09/22/2006  . Osteoarthritis 09/22/2006    ,, PTA 08/05/2018, 8:55 AM  [facility] [address] [place], [place], [address] Phone: [phone]   Fax:  [phone]  Name: Quinton Voth MRN: [id number] Date of Birth: 09-Sep-1931

## 2018-08-10 ENCOUNTER — Ambulatory Visit: Payer: Medicare Other | Admitting: Physical Therapy

## 2018-08-10 ENCOUNTER — Encounter: Payer: Self-pay | Admitting: Physical Therapy

## 2018-08-10 ENCOUNTER — Other Ambulatory Visit: Payer: Self-pay

## 2018-08-10 ENCOUNTER — Ambulatory Visit
Admission: RE | Admit: 2018-08-10 | Discharge: 2018-08-10 | Disposition: A | Discharge status: Home / Self Care | Payer: Medicare Other | Source: Ambulatory Visit | Attending: Family Medicine | Admitting: Family Medicine

## 2018-08-10 DIAGNOSIS — R296 Repeated falls: Secondary | ICD-10-CM | POA: Diagnosis not present

## 2018-08-10 DIAGNOSIS — M25561 Pain in right knee: Secondary | ICD-10-CM

## 2018-08-10 DIAGNOSIS — M25552 Pain in left hip: Secondary | ICD-10-CM

## 2018-08-10 DIAGNOSIS — M6281 Muscle weakness (generalized): Secondary | ICD-10-CM

## 2018-08-10 DIAGNOSIS — G8929 Other chronic pain: Secondary | ICD-10-CM | POA: Diagnosis not present

## 2018-08-10 DIAGNOSIS — M47817 Spondylosis without myelopathy or radiculopathy, lumbosacral region: Secondary | ICD-10-CM | POA: Diagnosis not present

## 2018-08-10 DIAGNOSIS — M5416 Radiculopathy, lumbar region: Secondary | ICD-10-CM

## 2018-08-10 DIAGNOSIS — R262 Difficulty in walking, not elsewhere classified: Secondary | ICD-10-CM

## 2018-08-10 DIAGNOSIS — M25661 Stiffness of right knee, not elsewhere classified: Secondary | ICD-10-CM | POA: Diagnosis not present

## 2018-08-10 DIAGNOSIS — R279 Unspecified lack of coordination: Secondary | ICD-10-CM

## 2018-08-10 MED ORDER — METHYLPREDNISOLONE ACETATE 40 MG/ML INJ SUSP (RADIOLOG
120.0000 mg | Freq: Once | INTRAMUSCULAR | Status: AC
  Administered 2018-08-10: 120 mg via EPIDURAL

## 2018-08-10 MED ORDER — IOPAMIDOL (ISOVUE-M 200) INJECTION 41%
1.0000 mL | Freq: Once | INTRAMUSCULAR | Status: AC
  Administered 2018-08-10: 16:00:00 1 mL via EPIDURAL

## 2018-08-10 NOTE — Discharge Instructions (Signed)

## 2018-08-10 NOTE — Therapy (Signed)
[facility] [address] [place], [place], [address] Phone: [phone]   Fax:  [phone]  Physical Therapy Treatment  Patient Details  Name: Darin [name] MRN: [id number] Date of Birth: [birth year] Referring Provider (PT): Dr. Hulan Saas    Encounter Date: 08/10/2018  PT End of Session - 08/10/18 1241    Visit Number  24    Date for PT Re-Evaluation  09/17/18    Authorization Type  Medicare     PT Start Time  1238    PT Stop Time  1325    PT Time Calculation (min)  47 min    Activity Tolerance  Patient tolerated treatment well    Behavior During Therapy  [facility] for tasks assessed/performed       Past Medical History:  Diagnosis Date  . Arthritis   . GERD (gastroesophageal reflux disease)   . H/O hiatal hernia    "FOR YEARS" " NO PROBLEM"  . Hyperlipidemia   . Hypertension   . Knee pain   . Neuropathy    PERIPHERAL   . Osteoporosis   . PMR (polymyalgia rheumatica) (HCC)   . Polymyalgia ([place]) 5 YEARS    Past Surgical History:  Procedure Laterality Date  . CARPAL TUNNEL RELEASE    . CATARACT EXTRACTION     right  . COLONOSCOPY  2007  . INGUINAL HERNIA REPAIR  10/14/2011   Procedure: HERNIA REPAIR INGUINAL ADULT;  Surgeon: [name], MD;  Location: [place];  Service: General;  Laterality: Right;  Right Inguinal Hernia Repair with Mesh  . TONSILLECTOMY    . VARICOSE VEIN SURGERY      There were no vitals filed for this visit.  Subjective Assessment - 08/10/18 1242    Subjective  I go this afternoon for an epidural. My trip has been cancelled so now I am all about mobility and independence.     Currently in Pain?  Yes    Pain Score  6     Pain Location  Hip    Pain Orientation  Left    Pain Descriptors / Indicators  Sore    Aggravating Factors   Standing/walking    Pain Relieving Factors  rest, sitting    Multiple Pain Sites  No                       [facility] Adult PT  Treatment/Exercise - 08/10/18 0001      Ambulation/Gait   Ambulation/Gait  Yes    Gait velocity  --   1: 15 sec  2: 15 sec  3: 14 sec 4: 13 sec  27 feet each time     Knee/Hip Exercises: Stretches   Gastroc Stretch  Both;5 reps;10 seconds   On slant board, VC to hold stretches: VC eyes up     Knee/Hip Exercises: Aerobic   Nustep  seat 13 arms 11 L3 10 min    PTA present to discuss progress- steady pace     Knee/Hip Exercises: Machines for Strengthening   Cybex Knee Extension  25# bil 20x2    Added second set today   Cybex Knee Flexion  30# 50x2   Vc to control eccentric vs "riding the resistance up." wt ok   Cybex Leg Press  Seat 9: Bil LE 80# 2x50, ULE 40# 2x25 each    Vc to control eccentric speed, montior knee pain/control  PT Short Term Goals - 07/23/18 0856      PT SHORT TERM GOAL #1   Title  be independent in initial HEP      Status  Achieved      PT SHORT TERM GOAL #2   Title  The patient will have improved right knee ROM 3-120 degrees needed for greater ease getting up and down from the chair and in/out of the car    Status  Achieved      PT SHORT TERM GOAL #3   Title  The patient will have improved hip flexor muscle length to 5 degrees and HS length to 65 degrees needed for improved stride length with ambulation    Status  Achieved      PT SHORT TERM GOAL #4   Title  Timed up and Go to 16 sec indicating improved gait safety    Status  Achieved      PT SHORT TERM GOAL #5   Title  Ten meter walk test to 14 sec indicate improved gait speed needed for community ambulation     Time  4    Period  Weeks    Status  On-going    Target Date  09/17/18        PT Long Term Goals - 07/23/18 0838      PT LONG TERM GOAL #1   Title  be independent in advanced HEP for further improvements in strength, ROM and balance      Time  8    Period  Weeks    Status  On-going    Target Date  09/17/18      PT LONG TERM GOAL #2   Title  Right knee ROM  improved to 2-125 degrees needed for greater mobility     Time  8    Status  On-going      PT LONG TERM GOAL #3   Title  Improved right knee extension strength to 4+/5 and knee flexion strength to 5-/5 needed for greater ease rising    Time  8    Period  Weeks    Status  On-going      PT LONG TERM GOAL #4   Title  The patient will be able to walk short distances 50 feet or less with 2 canes/poles    Status  New      PT LONG TERM GOAL #5   Title  The patient will be able to ambulate 500 feet in six minutes with RW needed for shorter distance community ambulation for traveling    Time  8    Period  Weeks    Status  Revised            Plan - 08/10/18 1245    Clinical Impression Statement  Pt will have an epidural injection today to his LT lumbar. Knee continually gives out intermittently. There appears to be no negative effects from his strength training efforts.  Pt reporting greater endurance regarding his gait: walking farther and not being as tired.  Pt was able to demonstrate increasing ability to walk 27 feet faster in 4 attempts. This was at the end of his session when he was fatigued.     Rehab Potential  Good    Clinical Impairments Affecting Rehab Potential  risk for falls    PT Frequency  2x / week    PT Duration  8 weeks    PT Treatment/Interventions  ADLs/Self Care Home Management;Cryotherapy;Electrical Stimulation;Ultrasound;Moist Heat;Iontophoresis  4mg /ml Dexamethasone;Gait training;Stair training;Therapeutic activities;Therapeutic exercise;Patient/family education;Neuromuscular re-education;Manual techniques;Taping;Dry needling    PT Home Exercise Plan  Access Code: [email or web address]     Consulted and Agree with Plan of Care  Patient       Patient will benefit from skilled therapeutic intervention in order to improve the following deficits and impairments:  Pain, Decreased range of motion, Decreased strength, Decreased activity tolerance, Impaired perceived functional  ability, Difficulty walking, Decreased balance  Visit Diagnosis: Chronic pain of right knee  Stiffness of right knee, not elsewhere classified  Muscle weakness (generalized)  Repeated falls  Unspecified lack of coordination  Difficulty in walking, not elsewhere classified  Pain in left hip     Problem List Patient Active Problem List   Diagnosis Date Noted  . Chronic left SI joint pain 04/27/2018  . Right wrist pain 02/05/2018  . Degenerative arthritis of right knee 10/23/2017  . Left leg cellulitis 08/12/2017  . Arthritis of right knee 05/20/2017  . Diverticulosis of colon with hemorrhage   . Acute blood loss anemia   . GIB (gastrointestinal bleeding) 03/12/2017  . Leg edema, right 02/20/2015  . BPH (benign prostatic hyperplasia) 12/09/2014  . Chronic radicular low back pain 08/06/2012  . COPD (chronic obstructive pulmonary disease) ([place]) 04/21/2012  . Syncope 03/29/2012  . Hyponatremia 03/29/2012  . Localized osteoarthrosis not specified whether primary or secondary, lower leg 06/20/2010  . VARICOSE VEINS LOWER EXTREMITIES W/INFLAMMATION 02/12/2010  . [place] LOCALIZED OSTEOARTHROSIS INVOLVING HAND 10/16/2009  . DUPUYTREN'S CONTRACTURE, RIGHT 07/04/2009  . DEGENERATIVE DISC DISEASE, LUMBOSACRAL SPINE W/RADICULOPATHY 06/19/2009  . CARCINOMA, BASAL CELL 02/07/2009  . ANEMIA OF CHRONIC DISEASE 02/07/2009  . BURSITIS, RIGHT SHOULDER 10/07/2008  . CATARACT ASSOCIATED WITH OTHER SYNDROMES 08/01/2008  . DRY SKIN 08/01/2008  . CERUMEN IMPACTION, BILATERAL 03/01/2008  . PMR (polymyalgia rheumatica) ([place]) 01/18/2008  . CRAMP IN LIMB 01/18/2008  . DEPRESSION, SITUATIONAL, ACUTE 12/28/2007  . CONSTIPATION, DRUG INDUCED 12/28/2007  . POLYNEUROPATHY OTHER DISEASES CLASSIFIED ELSW 09/24/2007  . WEIGHT LOSS, ABNORMAL 01/07/2007  . Hyperlipidemia 09/22/2006  . Essential hypertension 09/22/2006  . GERD (gastroesophageal reflux disease) 09/22/2006  . Osteoarthritis 09/22/2006     Kei Langhorst, PTA 08/10/2018, 1:32 PM  [facility] [address] [place], [place], [address] Phone: [phone]   Fax:  [phone]  Name: Gayle Collard MRN: [id number] Date of Birth: 1931/10/02

## 2018-08-14 ENCOUNTER — Ambulatory Visit: Payer: Medicare Other | Admitting: Physical Therapy

## 2018-08-14 ENCOUNTER — Other Ambulatory Visit: Payer: Self-pay

## 2018-08-14 ENCOUNTER — Encounter: Payer: Self-pay | Admitting: Physical Therapy

## 2018-08-14 DIAGNOSIS — G8929 Other chronic pain: Secondary | ICD-10-CM | POA: Diagnosis not present

## 2018-08-14 DIAGNOSIS — M25552 Pain in left hip: Secondary | ICD-10-CM

## 2018-08-14 DIAGNOSIS — M25661 Stiffness of right knee, not elsewhere classified: Secondary | ICD-10-CM | POA: Diagnosis not present

## 2018-08-14 DIAGNOSIS — R279 Unspecified lack of coordination: Secondary | ICD-10-CM | POA: Diagnosis not present

## 2018-08-14 DIAGNOSIS — R296 Repeated falls: Secondary | ICD-10-CM

## 2018-08-14 DIAGNOSIS — M6281 Muscle weakness (generalized): Secondary | ICD-10-CM

## 2018-08-14 DIAGNOSIS — M25561 Pain in right knee: Secondary | ICD-10-CM | POA: Diagnosis not present

## 2018-08-14 DIAGNOSIS — R262 Difficulty in walking, not elsewhere classified: Secondary | ICD-10-CM

## 2018-08-14 NOTE — Therapy (Signed)
[facility] [address], [place], [place], [address] Phone: [phone]   Fax:  [phone]  Physical Therapy Treatment  Patient Details  Name: Darin [name] MRN: [id number] Date of Birth: 03-30-1931 Referring Provider (PT): Dr. Hulan Saas    Encounter Date: 08/14/2018  PT End of Session - 08/14/18 0804    Visit Number  25    Date for PT Re-Evaluation  09/17/18    Authorization Type  Medicare     PT Start Time  0800    PT Stop Time  0840    PT Time Calculation (min)  40 min    Activity Tolerance  Patient tolerated treatment well    Behavior During Therapy  [facility] for tasks assessed/performed       Past Medical History:  Diagnosis Date  . Arthritis   . GERD (gastroesophageal reflux disease)   . H/O hiatal hernia    "FOR YEARS" " NO PROBLEM"  . Hyperlipidemia   . Hypertension   . Knee pain   . Neuropathy    PERIPHERAL   . Osteoporosis   . PMR (polymyalgia rheumatica) (HCC)   . Polymyalgia ([place]) 5 YEARS    Past Surgical History:  Procedure Laterality Date  . CARPAL TUNNEL RELEASE    . CATARACT EXTRACTION     right  . COLONOSCOPY  2007  . INGUINAL HERNIA REPAIR  10/14/2011   Procedure: HERNIA REPAIR INGUINAL ADULT;  Surgeon: [name], MD;  Location: [place];  Service: General;  Laterality: Right;  Right Inguinal Hernia Repair with Mesh  . TONSILLECTOMY    . VARICOSE VEIN SURGERY      There were no vitals filed for this visit.  Subjective Assessment - 08/14/18 0805    Subjective  Had injection Monday afternoon and pt reports feeling better already. Lt hip feels slightly better. Knee continues to give way. Pt reports it is "bad."    Currently in Pain?  Yes    Pain Score  4     Pain Location  Hip    Pain Orientation  Left    Pain Descriptors / Indicators  Aching                       [facility] Adult PT Treatment/Exercise - 08/14/18 0001      Knee/Hip Exercises: Stretches    Active Hamstring Stretch  Both;3 reps;10 seconds   VC for lengthening his spine more and using support   Gastroc Stretch  Both;5 reps;10 seconds   On slant board, VC to hold stretches: VC eyes up     Knee/Hip Exercises: Aerobic   Nustep  seat 13 arms 11 L3 10 min    PTA present to discuss progress- steady pace     Knee/Hip Exercises: Machines for Strengthening   Cybex Knee Extension  25# bil 25x, then stopped d/t fatigue   Added second set today   Cybex Knee Flexion  30# 50x, 30# + 2# ankle weight 50x   Vc to control eccentric vs "riding the resistance up." wt ok   Cybex Leg Press  Seat 9: Bil 80# 25x, 85# 25x       Knee/Hip Exercises: Standing   Hip Abduction  --   2x4 lengths along counter top green band side stepping: VC   Abduction Limitations  VC for pt hips to stay in plane               PT Short  Term Goals - 07/23/18 0856      PT SHORT TERM GOAL #1   Title  be independent in initial HEP      Status  Achieved      PT SHORT TERM GOAL #2   Title  The patient will have improved right knee ROM 3-120 degrees needed for greater ease getting up and down from the chair and in/out of the car    Status  Achieved      PT SHORT TERM GOAL #3   Title  The patient will have improved hip flexor muscle length to 5 degrees and HS length to 65 degrees needed for improved stride length with ambulation    Status  Achieved      PT SHORT TERM GOAL #4   Title  Timed up and Go to 16 sec indicating improved gait safety    Status  Achieved      PT SHORT TERM GOAL #5   Title  Ten meter walk test to 14 sec indicate improved gait speed needed for community ambulation     Time  4    Period  Weeks    Status  On-going    Target Date  09/17/18        PT Long Term Goals - 07/23/18 0838      PT LONG TERM GOAL #1   Title  be independent in advanced HEP for further improvements in strength, ROM and balance      Time  8    Period  Weeks    Status  On-going    Target Date  09/17/18       PT LONG TERM GOAL #2   Title  Right knee ROM improved to 2-125 degrees needed for greater mobility     Time  8    Status  On-going      PT LONG TERM GOAL #3   Title  Improved right knee extension strength to 4+/5 and knee flexion strength to 5-/5 needed for greater ease rising    Time  8    Period  Weeks    Status  On-going      PT LONG TERM GOAL #4   Title  The patient will be able to walk short distances 50 feet or less with 2 canes/poles    Status  New      PT LONG TERM GOAL #5   Title  The patient will be able to ambulate 500 feet in six minutes with RW needed for shorter distance community ambulation for traveling    Time  8    Period  Weeks    Status  Revised            Plan - 08/14/18 3419    Clinical Impression Statement  Pt received an epidural early this week and reports pain relief in his left hip. Unfortunately he continues to have problems with his left knee giving way when walking frequently. Fortunately no falls.      Rehab Potential  Good    Clinical Impairments Affecting Rehab Potential  risk for falls    PT Frequency  2x / week    PT Duration  8 weeks    PT Treatment/Interventions  ADLs/Self Care Home Management;Cryotherapy;Electrical Stimulation;Ultrasound;Moist Heat;Iontophoresis 4mg /ml Dexamethasone;Gait training;Stair training;Therapeutic activities;Therapeutic exercise;Patient/family education;Neuromuscular re-education;Manual techniques;Taping;Dry needling    PT Next Visit Plan  KX: Pt is interested in continuing PT while the gyms remain closed due to COVID-19. MMT LE and AROM for goals.  PT Home Exercise Plan  Access Code: [email or web address]        Patient will benefit from skilled therapeutic intervention in order to improve the following deficits and impairments:  Pain, Decreased range of motion, Decreased strength, Decreased activity tolerance, Impaired perceived functional ability, Difficulty walking, Decreased balance  Visit Diagnosis: Chronic  pain of right knee  Stiffness of right knee, not elsewhere classified  Muscle weakness (generalized)  Repeated falls  Unspecified lack of coordination  Difficulty in walking, not elsewhere classified  Pain in left hip     Problem List Patient Active Problem List   Diagnosis Date Noted  . Chronic left SI joint pain 04/27/2018  . Right wrist pain 02/05/2018  . Degenerative arthritis of right knee 10/23/2017  . Left leg cellulitis 08/12/2017  . Arthritis of right knee 05/20/2017  . Diverticulosis of colon with hemorrhage   . Acute blood loss anemia   . GIB (gastrointestinal bleeding) 03/12/2017  . Leg edema, right 02/20/2015  . BPH (benign prostatic hyperplasia) 12/09/2014  . Chronic radicular low back pain 08/06/2012  . COPD (chronic obstructive pulmonary disease) ([place]) 04/21/2012  . Syncope 03/29/2012  . Hyponatremia 03/29/2012  . Localized osteoarthrosis not specified whether primary or secondary, lower leg 06/20/2010  . VARICOSE VEINS LOWER EXTREMITIES W/INFLAMMATION 02/12/2010  . [place] LOCALIZED OSTEOARTHROSIS INVOLVING HAND 10/16/2009  . DUPUYTREN'S CONTRACTURE, RIGHT 07/04/2009  . DEGENERATIVE DISC DISEASE, LUMBOSACRAL SPINE W/RADICULOPATHY 06/19/2009  . CARCINOMA, BASAL CELL 02/07/2009  . ANEMIA OF CHRONIC DISEASE 02/07/2009  . BURSITIS, RIGHT SHOULDER 10/07/2008  . CATARACT ASSOCIATED WITH OTHER SYNDROMES 08/01/2008  . DRY SKIN 08/01/2008  . CERUMEN IMPACTION, BILATERAL 03/01/2008  . PMR (polymyalgia rheumatica) ([place]) 01/18/2008  . CRAMP IN LIMB 01/18/2008  . DEPRESSION, SITUATIONAL, ACUTE 12/28/2007  . CONSTIPATION, DRUG INDUCED 12/28/2007  . POLYNEUROPATHY OTHER DISEASES CLASSIFIED ELSW 09/24/2007  . WEIGHT LOSS, ABNORMAL 01/07/2007  . Hyperlipidemia 09/22/2006  . Essential hypertension 09/22/2006  . GERD (gastroesophageal reflux disease) 09/22/2006  . Osteoarthritis 09/22/2006    Angelena Sand, PTA 08/14/2018, 8:44 AM  [facility] [address], [place], [place], [address] Phone: [phone]   Fax:  [phone]  Name: Darin [name] MRN: [id number] Date of Birth: December 30, 1931

## 2018-08-18 ENCOUNTER — Encounter: Payer: Self-pay | Admitting: Physical Therapy

## 2018-08-18 ENCOUNTER — Other Ambulatory Visit: Payer: Self-pay

## 2018-08-18 ENCOUNTER — Ambulatory Visit: Payer: Medicare Other | Admitting: Physical Therapy

## 2018-08-18 DIAGNOSIS — M25561 Pain in right knee: Secondary | ICD-10-CM | POA: Diagnosis not present

## 2018-08-18 DIAGNOSIS — M6281 Muscle weakness (generalized): Secondary | ICD-10-CM

## 2018-08-18 DIAGNOSIS — M25661 Stiffness of right knee, not elsewhere classified: Secondary | ICD-10-CM | POA: Diagnosis not present

## 2018-08-18 DIAGNOSIS — G8929 Other chronic pain: Secondary | ICD-10-CM

## 2018-08-18 DIAGNOSIS — R279 Unspecified lack of coordination: Secondary | ICD-10-CM | POA: Diagnosis not present

## 2018-08-18 DIAGNOSIS — R296 Repeated falls: Secondary | ICD-10-CM | POA: Diagnosis not present

## 2018-08-18 NOTE — Therapy (Signed)
[facility] [address], [place], [place], [address] Phone: [phone]   Fax:  [phone]  Physical Therapy Treatment  Patient Details  Name: Darin [name] MRN: [id number] Date of Birth: October 06, 1931 Referring Provider (PT): Dr. Hulan Saas    Encounter Date: 08/18/2018  PT End of Session - 08/18/18 0815    Visit Number  26    Date for PT Re-Evaluation  09/17/18    Authorization Type  Medicare     PT Start Time  0805    PT Stop Time  0845    PT Time Calculation (min)  40 min    Activity Tolerance  Patient tolerated treatment well       Past Medical History:  Diagnosis Date  . Arthritis   . GERD (gastroesophageal reflux disease)   . H/O hiatal hernia    "FOR YEARS" " NO PROBLEM"  . Hyperlipidemia   . Hypertension   . Knee pain   . Neuropathy    PERIPHERAL   . Osteoporosis   . PMR (polymyalgia rheumatica) (HCC)   . Polymyalgia ([place]) 5 YEARS    Past Surgical History:  Procedure Laterality Date  . CARPAL TUNNEL RELEASE    . CATARACT EXTRACTION     right  . COLONOSCOPY  2007  . INGUINAL HERNIA REPAIR  10/14/2011   Procedure: HERNIA REPAIR INGUINAL ADULT;  Surgeon: [name], MD;  Location: [place];  Service: General;  Laterality: Right;  Right Inguinal Hernia Repair with Mesh  . TONSILLECTOMY    . VARICOSE VEIN SURGERY      There were no vitals filed for this visit.  Subjective Assessment - 08/18/18 0803    Subjective  Presents with RW.  This knee still buckles and hurts and that really incapacitates me.  The epidural helped some for the hip but not for the knee.  I'm going to ask the doctor what can be done.     Pertinent History  MD follow up in July    Currently in Pain?  Yes    Pain Score  8     Pain Location  Knee    Pain Orientation  Right         [facility] PT Assessment - 08/18/18 0001      Strength   Right Hip Flexion  4/5    Right Knee Flexion  4-/5    Right Knee Extension   4/5    Left Knee Flexion  4+/5    Left Knee Extension  4/5      Ambulation/Gait   Ambulation/Gait Assistance  6: Modified independent (Device/Increase time)    Ambulation Distance (Feet)  70 Feet    Assistive device  Rolling walker    Gait Pattern  --   cues to hold head up     Standardized Balance Assessment   10 Meter Walk  17.56   RW                  [facility] Adult PT Treatment/Exercise - 08/18/18 0001      Knee/Hip Exercises: Stretches   Active Hamstring Stretch  Right;Left;5 reps    Active Hamstring Stretch Limitations  on 2nd step     Gastroc Stretch  Both;5 reps;10 seconds   On slant board, VC to hold stretches: VC eyes up     Knee/Hip Exercises: Aerobic   Nustep  seat 13 arms 11 L3 10 min         Knee/Hip  Exercises: Machines for Strengthening   Cybex Knee Extension  25# bil 20x, then stopped d/t fatigue   small range   Cybex Knee Flexion  30# 50x, 30# + 2# ankle weight 50x   Vc to control eccentric vs "riding the resistance up." wt ok   Cybex Leg Press  Seat 9: 85# bil 50x; 45# single leg 50x each       Knee/Hip Exercises: Seated   Long Arc Quad  Right;Left;10 reps;Weights    Long Arc Quad Weight  4 lbs.    Clamshell with TheraBand  Green   2x10   Other Seated Knee/Hip Exercises  green band around thighs with marching 4# ankle weights 10x               PT Short Term Goals - 07/23/18 0856      PT SHORT TERM GOAL #1   Title  be independent in initial HEP      Status  Achieved      PT SHORT TERM GOAL #2   Title  The patient will have improved right knee ROM 3-120 degrees needed for greater ease getting up and down from the chair and in/out of the car    Status  Achieved      PT SHORT TERM GOAL #3   Title  The patient will have improved hip flexor muscle length to 5 degrees and HS length to 65 degrees needed for improved stride length with ambulation    Status  Achieved      PT SHORT TERM GOAL #4   Title  Timed up and Go to 16 sec  indicating improved gait safety    Status  Achieved      PT SHORT TERM GOAL #5   Title  Ten meter walk test to 14 sec indicate improved gait speed needed for community ambulation     Time  4    Period  Weeks    Status  On-going    Target Date  09/17/18        PT Long Term Goals - 07/23/18 0838      PT LONG TERM GOAL #1   Title  be independent in advanced HEP for further improvements in strength, ROM and balance      Time  8    Period  Weeks    Status  On-going    Target Date  09/17/18      PT LONG TERM GOAL #2   Title  Right knee ROM improved to 2-125 degrees needed for greater mobility     Time  8    Status  On-going      PT LONG TERM GOAL #3   Title  Improved right knee extension strength to 4+/5 and knee flexion strength to 5-/5 needed for greater ease rising    Time  8    Period  Weeks    Status  On-going      PT LONG TERM GOAL #4   Title  The patient will be able to walk short distances 50 feet or less with 2 canes/poles    Status  New      PT LONG TERM GOAL #5   Title  The patient will be able to ambulate 500 feet in six minutes with RW needed for shorter distance community ambulation for traveling    Time  8    Period  Weeks    Status  Revised  Plan - 08/18/18 0818    Clinical Impression Statement  The patient needs numerous rest breaks today between exercises secondary to general fatigue.  Knee pain level remains at a moderate/high level throughout treatment session but no worse with ex.  No evidence of knee give way during treatment session but patient states this happens often without warning.  No improvement in 10 m walk test since re-eval which may be the result of increased knee pain today and shortness of breath at times with wearing a mask now to PT.  Therapist closely monitoring response throughout treatment session and providing close supervision for safety with standing ex.      Rehab Potential  Good    Clinical Impairments Affecting  Rehab Potential  risk for falls    PT Frequency  2x / week    PT Duration  8 weeks    PT Treatment/Interventions  ADLs/Self Care Home Management;Cryotherapy;Electrical Stimulation;Ultrasound;Moist Heat;Iontophoresis 4mg /ml Dexamethasone;Gait training;Stair training;Therapeutic activities;Therapeutic exercise;Patient/family education;Neuromuscular re-education;Manual techniques;Taping;Dry needling    PT Next Visit Plan  KX: Pt is interested in continuing PT while the gyms remain closed due to COVID-19;  LE strengthening     PT Home Exercise Plan  Access Code: [email or web address]        Patient will benefit from skilled therapeutic intervention in order to improve the following deficits and impairments:  Pain, Decreased range of motion, Decreased strength, Decreased activity tolerance, Impaired perceived functional ability, Difficulty walking, Decreased balance  Visit Diagnosis: Chronic pain of right knee  Stiffness of right knee, not elsewhere classified  Muscle weakness (generalized)     Problem List Patient Active Problem List   Diagnosis Date Noted  . Chronic left SI joint pain 04/27/2018  . Right wrist pain 02/05/2018  . Degenerative arthritis of right knee 10/23/2017  . Left leg cellulitis 08/12/2017  . Arthritis of right knee 05/20/2017  . Diverticulosis of colon with hemorrhage   . Acute blood loss anemia   . GIB (gastrointestinal bleeding) 03/12/2017  . Leg edema, right 02/20/2015  . BPH (benign prostatic hyperplasia) 12/09/2014  . Chronic radicular low back pain 08/06/2012  . COPD (chronic obstructive pulmonary disease) ([place]) 04/21/2012  . Syncope 03/29/2012  . Hyponatremia 03/29/2012  . Localized osteoarthrosis not specified whether primary or secondary, lower leg 06/20/2010  . VARICOSE VEINS LOWER EXTREMITIES W/INFLAMMATION 02/12/2010  . [place] LOCALIZED OSTEOARTHROSIS INVOLVING HAND 10/16/2009  . DUPUYTREN'S CONTRACTURE, RIGHT 07/04/2009  . DEGENERATIVE DISC DISEASE,  LUMBOSACRAL SPINE W/RADICULOPATHY 06/19/2009  . CARCINOMA, BASAL CELL 02/07/2009  . ANEMIA OF CHRONIC DISEASE 02/07/2009  . BURSITIS, RIGHT SHOULDER 10/07/2008  . CATARACT ASSOCIATED WITH OTHER SYNDROMES 08/01/2008  . DRY SKIN 08/01/2008  . CERUMEN IMPACTION, BILATERAL 03/01/2008  . PMR (polymyalgia rheumatica) ([place]) 01/18/2008  . CRAMP IN LIMB 01/18/2008  . DEPRESSION, SITUATIONAL, ACUTE 12/28/2007  . CONSTIPATION, DRUG INDUCED 12/28/2007  . POLYNEUROPATHY OTHER DISEASES CLASSIFIED ELSW 09/24/2007  . WEIGHT LOSS, ABNORMAL 01/07/2007  . Hyperlipidemia 09/22/2006  . Essential hypertension 09/22/2006  . GERD (gastroesophageal reflux disease) 09/22/2006  . Osteoarthritis 09/22/2006   Ruben Im, PT 08/18/18 8:59 AM Phone: [phone] Fax: [phone]  Alvera Singh 08/18/2018, 8:57 AM  [facility] [address], [place], [place], [address] Phone: [phone]   Fax:  [phone]  Name: Breanna Mcdaniel MRN: [id number] Date of Birth: [birth year]

## 2018-08-21 ENCOUNTER — Encounter: Payer: Self-pay | Admitting: Physical Therapy

## 2018-08-21 ENCOUNTER — Ambulatory Visit: Payer: Medicare Other | Admitting: Physical Therapy

## 2018-08-21 ENCOUNTER — Other Ambulatory Visit: Payer: Self-pay

## 2018-08-21 DIAGNOSIS — M25661 Stiffness of right knee, not elsewhere classified: Secondary | ICD-10-CM

## 2018-08-21 DIAGNOSIS — G8929 Other chronic pain: Secondary | ICD-10-CM | POA: Diagnosis not present

## 2018-08-21 DIAGNOSIS — M6281 Muscle weakness (generalized): Secondary | ICD-10-CM | POA: Diagnosis not present

## 2018-08-21 DIAGNOSIS — M25552 Pain in left hip: Secondary | ICD-10-CM

## 2018-08-21 DIAGNOSIS — R279 Unspecified lack of coordination: Secondary | ICD-10-CM

## 2018-08-21 DIAGNOSIS — R296 Repeated falls: Secondary | ICD-10-CM | POA: Diagnosis not present

## 2018-08-21 DIAGNOSIS — M25561 Pain in right knee: Secondary | ICD-10-CM | POA: Diagnosis not present

## 2018-08-21 DIAGNOSIS — R262 Difficulty in walking, not elsewhere classified: Secondary | ICD-10-CM

## 2018-08-21 NOTE — Therapy (Signed)
[facility] [address], [place], [place], [address] Phone: [phone]   Fax:  [phone]  Physical Therapy Treatment  Patient Details  Name: Darin [name] MRN: [id number] Date of Birth: [birth year] Referring Provider (PT): Dr. Hulan Saas    Encounter Date: 08/21/2018  PT End of Session - 08/21/18 0804    Visit Number  27    Date for PT Re-Evaluation  09/17/18    Authorization Type  Medicare     PT Start Time  0800    PT Stop Time  0840    PT Time Calculation (min)  40 min    Activity Tolerance  Patient tolerated treatment well    Behavior During Therapy  [facility] for tasks assessed/performed       Past Medical History:  Diagnosis Date  . Arthritis   . GERD (gastroesophageal reflux disease)   . H/O hiatal hernia    "FOR YEARS" " NO PROBLEM"  . Hyperlipidemia   . Hypertension   . Knee pain   . Neuropathy    PERIPHERAL   . Osteoporosis   . PMR (polymyalgia rheumatica) (HCC)   . Polymyalgia ([place]) 5 YEARS    Past Surgical History:  Procedure Laterality Date  . CARPAL TUNNEL RELEASE    . CATARACT EXTRACTION     right  . COLONOSCOPY  2007  . INGUINAL HERNIA REPAIR  10/14/2011   Procedure: HERNIA REPAIR INGUINAL ADULT;  Surgeon: [name], MD;  Location: [facility];  Service: General;  Laterality: Right;  Right Inguinal Hernia Repair with Mesh  . TONSILLECTOMY    . VARICOSE VEIN SURGERY      There were no vitals filed for this visit.  Subjective Assessment - 08/21/18 0806    Subjective  I am getting a shot in my knee next week.     Currently in Pain?  Yes    Pain Score  8     Pain Location  Knee    Pain Orientation  Right    Pain Descriptors / Indicators  Aching    Aggravating Factors   Weightbearing    Pain Relieving Factors  rest    Multiple Pain Sites  No                       [facility] Adult PT Treatment/Exercise - 08/21/18 0001      Knee/Hip Exercises: Stretches   Active  Hamstring Stretch  Right;Left;5 reps   Could hold 15 sec today   Active Hamstring Stretch Limitations  on 2nd step     Gastroc Stretch  Both;5 reps;20 seconds   On slant board, VC to hold stretches: VC eyes up     Knee/Hip Exercises: Aerobic   Nustep  seat 13 arms 11 L3 10 min    PTA present to discuss progress- steady pace     Knee/Hip Exercises: Machines for Strengthening   Cybex Knee Extension  Bil 25# 30x    Cybex Knee Flexion  30# + 2# ankle weight 60x     Cybex Leg Press  Seat 9: 85# bil 60x; 45# single leg 60x each    Good pace              PT Short Term Goals - 07/23/18 0856      PT SHORT TERM GOAL #1   Title  be independent in initial HEP      Status  Achieved      PT  SHORT TERM GOAL #2   Title  The patient will have improved right knee ROM 3-120 degrees needed for greater ease getting up and down from the chair and in/out of the car    Status  Achieved      PT SHORT TERM GOAL #3   Title  The patient will have improved hip flexor muscle length to 5 degrees and HS length to 65 degrees needed for improved stride length with ambulation    Status  Achieved      PT SHORT TERM GOAL #4   Title  Timed up and Go to 16 sec indicating improved gait safety    Status  Achieved      PT SHORT TERM GOAL #5   Title  Ten meter walk test to 14 sec indicate improved gait speed needed for community ambulation     Time  4    Period  Weeks    Status  On-going    Target Date  09/17/18        PT Long Term Goals - 07/23/18 0838      PT LONG TERM GOAL #1   Title  be independent in advanced HEP for further improvements in strength, ROM and balance      Time  8    Period  Weeks    Status  On-going    Target Date  09/17/18      PT LONG TERM GOAL #2   Title  Right knee ROM improved to 2-125 degrees needed for greater mobility     Time  8    Status  On-going      PT LONG TERM GOAL #3   Title  Improved right knee extension strength to 4+/5 and knee flexion strength to  5-/5 needed for greater ease rising    Time  8    Period  Weeks    Status  On-going      PT LONG TERM GOAL #4   Title  The patient will be able to walk short distances 50 feet or less with 2 canes/poles    Status  New      PT LONG TERM GOAL #5   Title  The patient will be able to ambulate 500 feet in six minutes with RW needed for shorter distance community ambulation for traveling    Time  8    Period  Weeks    Status  Revised            Plan - 08/21/18 0815    Clinical Impression Statement  No change in knee pain from last session. Pt able to hold gastroc and hamstring stretches longer today as well as increase his reps on the leg press. Pain did not interfere with his exercising today.     Rehab Potential  Good    Clinical Impairments Affecting Rehab Potential  risk for falls    PT Frequency  2x / week    PT Duration  8 weeks    PT Treatment/Interventions  ADLs/Self Care Home Management;Cryotherapy;Electrical Stimulation;Ultrasound;Moist Heat;Iontophoresis 4mg /ml Dexamethasone;Gait training;Stair training;Therapeutic activities;Therapeutic exercise;Patient/family education;Neuromuscular re-education;Manual techniques;Taping;Dry needling    PT Next Visit Plan  KX: continue to strengthen LE as gyms remain closed    PT Home Exercise Plan  Access Code: [email or web address]     Consulted and Agree with Plan of Care  Patient       Patient will benefit from skilled therapeutic intervention in order to improve the following deficits and impairments:  Pain,  Decreased range of motion, Decreased strength, Decreased activity tolerance, Impaired perceived functional ability, Difficulty walking, Decreased balance  Visit Diagnosis: Chronic pain of right knee  Stiffness of right knee, not elsewhere classified  Muscle weakness (generalized)  Repeated falls  Unspecified lack of coordination  Difficulty in walking, not elsewhere classified  Pain in left hip     Problem List Patient Active  Problem List   Diagnosis Date Noted  . Chronic left SI joint pain 04/27/2018  . Right wrist pain 02/05/2018  . Degenerative arthritis of right knee 10/23/2017  . Left leg cellulitis 08/12/2017  . Arthritis of right knee 05/20/2017  . Diverticulosis of colon with hemorrhage   . Acute blood loss anemia   . GIB (gastrointestinal bleeding) 03/12/2017  . Leg edema, right 02/20/2015  . BPH (benign prostatic hyperplasia) 12/09/2014  . Chronic radicular low back pain 08/06/2012  . COPD (chronic obstructive pulmonary disease) ([place]) 04/21/2012  . Syncope 03/29/2012  . Hyponatremia 03/29/2012  . Localized osteoarthrosis not specified whether primary or secondary, lower leg 06/20/2010  . VARICOSE VEINS LOWER EXTREMITIES W/INFLAMMATION 02/12/2010  . [place] LOCALIZED OSTEOARTHROSIS INVOLVING HAND 10/16/2009  . DUPUYTREN'S CONTRACTURE, RIGHT 07/04/2009  . DEGENERATIVE DISC DISEASE, LUMBOSACRAL SPINE W/RADICULOPATHY 06/19/2009  . CARCINOMA, BASAL CELL 02/07/2009  . ANEMIA OF CHRONIC DISEASE 02/07/2009  . BURSITIS, RIGHT SHOULDER 10/07/2008  . CATARACT ASSOCIATED WITH OTHER SYNDROMES 08/01/2008  . DRY SKIN 08/01/2008  . CERUMEN IMPACTION, BILATERAL 03/01/2008  . PMR (polymyalgia rheumatica) ([place]) 01/18/2008  . CRAMP IN LIMB 01/18/2008  . DEPRESSION, SITUATIONAL, ACUTE 12/28/2007  . CONSTIPATION, DRUG INDUCED 12/28/2007  . POLYNEUROPATHY OTHER DISEASES CLASSIFIED ELSW 09/24/2007  . WEIGHT LOSS, ABNORMAL 01/07/2007  . Hyperlipidemia 09/22/2006  . Essential hypertension 09/22/2006  . GERD (gastroesophageal reflux disease) 09/22/2006  . Osteoarthritis 09/22/2006    Mairin Lindsley, PTA 08/21/2018, 8:40 AM  [facility] [address] [place], [place], [address] Phone: [phone]   Fax:  [phone]  Name: Darin [name] MRN: [id number] Date of Birth: September 29, 1931

## 2018-08-25 ENCOUNTER — Ambulatory Visit: Payer: Medicare Other | Attending: Family Medicine | Admitting: Physical Therapy

## 2018-08-25 ENCOUNTER — Other Ambulatory Visit: Payer: Self-pay

## 2018-08-25 ENCOUNTER — Encounter: Payer: Self-pay | Admitting: Physical Therapy

## 2018-08-25 DIAGNOSIS — R296 Repeated falls: Secondary | ICD-10-CM | POA: Insufficient documentation

## 2018-08-25 DIAGNOSIS — G8929 Other chronic pain: Secondary | ICD-10-CM | POA: Diagnosis not present

## 2018-08-25 DIAGNOSIS — M6281 Muscle weakness (generalized): Secondary | ICD-10-CM | POA: Diagnosis not present

## 2018-08-25 DIAGNOSIS — M25661 Stiffness of right knee, not elsewhere classified: Secondary | ICD-10-CM | POA: Diagnosis not present

## 2018-08-25 DIAGNOSIS — M25561 Pain in right knee: Secondary | ICD-10-CM | POA: Diagnosis not present

## 2018-08-25 NOTE — Therapy (Signed)
[facility] [address] [place], [place], [address] Phone: [phone]   Fax:  [phone]  Physical Therapy Treatment  Patient Details  Name: Darin [name] MRN: [id number] Date of Birth: 28-Nov-1931 Referring Provider (PT): Dr. Hulan Saas    Encounter Date: 08/25/2018  PT End of Session - 08/25/18 0740    Visit Number  28    Date for PT Re-Evaluation  09/17/18    Authorization Type  Medicare     PT Start Time  0736    PT Stop Time  0820    PT Time Calculation (min)  44 min    Activity Tolerance  Patient tolerated treatment well       Past Medical History:  Diagnosis Date  . Arthritis   . GERD (gastroesophageal reflux disease)   . H/O hiatal hernia    "FOR YEARS" " NO PROBLEM"  . Hyperlipidemia   . Hypertension   . Knee pain   . Neuropathy    PERIPHERAL   . Osteoporosis   . PMR (polymyalgia rheumatica) (HCC)   . Polymyalgia ([place]) 5 YEARS    Past Surgical History:  Procedure Laterality Date  . CARPAL TUNNEL RELEASE    . CATARACT EXTRACTION     right  . COLONOSCOPY  2007  . INGUINAL HERNIA REPAIR  10/14/2011   Procedure: HERNIA REPAIR INGUINAL ADULT;  Surgeon: [name], MD;  Location: [place];  Service: General;  Laterality: Right;  Right Inguinal Hernia Repair with Mesh  . TONSILLECTOMY    . VARICOSE VEIN SURGERY      There were no vitals filed for this visit.  Subjective Assessment - 08/25/18 0733    Subjective  My knee is really bothering me as usual.   I'm getting an injection in it on Thursday.  Frequent knee give-way.  That's about the 5th or 6th time this morning (only 7:30 am).  The exercises don't seem to bother me when I do them every morning at home.  States the exercises at PT don't seem to aggravate him either.      Pertinent History  MD follow up in July;  Hoping to go to [place] in July    Diagnostic tests  No cartilage left in my knee.    Currently in Pain?  Yes    Pain Score  8     Pain Location  Knee    Pain Orientation  Right    Pain Type  Chronic pain                       [facility] Adult PT Treatment/Exercise - 08/25/18 0001      Knee/Hip Exercises: Stretches   Active Hamstring Stretch  Right;Left;5 reps   on 2nd step    Active Hamstring Stretch Limitations  on 2nd step    Gastroc Stretch  Both;5 reps;20 seconds   On slant board, VC to hold stretches: VC eyes up     Knee/Hip Exercises: Aerobic   Nustep  seat 13 arms 11 L3 10 min    while discussing status      Knee/Hip Exercises: Machines for Strengthening   Cybex Knee Extension  Bil 25# 30x    Cybex Knee Flexion  30# + 2# ankle weight 60x     Cybex Leg Press  Seat 9: 85# bil 100x; 45# single leg 50x each    Good pace     Knee/Hip Exercises: Standing   Heel  Raises  Both;15 reps    Abduction Limitations  green band around thighs side stepping 4 feet x5               PT Short Term Goals - 07/23/18 0856      PT SHORT TERM GOAL #1   Title  be independent in initial HEP      Status  Achieved      PT SHORT TERM GOAL #2   Title  The patient will have improved right knee ROM 3-120 degrees needed for greater ease getting up and down from the chair and in/out of the car    Status  Achieved      PT SHORT TERM GOAL #3   Title  The patient will have improved hip flexor muscle length to 5 degrees and HS length to 65 degrees needed for improved stride length with ambulation    Status  Achieved      PT SHORT TERM GOAL #4   Title  Timed up and Go to 16 sec indicating improved gait safety    Status  Achieved      PT SHORT TERM GOAL #5   Title  Ten meter walk test to 14 sec indicate improved gait speed needed for community ambulation     Time  4    Period  Weeks    Status  On-going    Target Date  09/17/18        PT Long Term Goals - 07/23/18 0838      PT LONG TERM GOAL #1   Title  be independent in advanced HEP for further improvements in strength, ROM and  balance      Time  8    Period  Weeks    Status  On-going    Target Date  09/17/18      PT LONG TERM GOAL #2   Title  Right knee ROM improved to 2-125 degrees needed for greater mobility     Time  8    Status  On-going      PT LONG TERM GOAL #3   Title  Improved right knee extension strength to 4+/5 and knee flexion strength to 5-/5 needed for greater ease rising    Time  8    Period  Weeks    Status  On-going      PT LONG TERM GOAL #4   Title  The patient will be able to walk short distances 50 feet or less with 2 canes/poles    Status  New      PT LONG TERM GOAL #5   Title  The patient will be able to ambulate 500 feet in six minutes with RW needed for shorter distance community ambulation for traveling    Time  8    Period  Weeks    Status  Revised            Plan - 08/25/18 0741    Clinical Impression Statement  The patient is using his RW today but increased shuffling noted especially on right foot.  Treatment modified to include more rest breaks since he is exercising in a mask and to include more seated and open chain strengthening rather than standing/weight bearing which aggravates right knee pain.  The patient continues to put forth good effort.  He reports his knee pain is no worse following treatment session.      Rehab Potential  Good    Clinical Impairments Affecting Rehab Potential  risk for falls    PT Frequency  2x / week    PT Duration  8 weeks    PT Treatment/Interventions  ADLs/Self Care Home Management;Cryotherapy;Electrical Stimulation;Ultrasound;Moist Heat;Iontophoresis 4mg /ml Dexamethasone;Gait training;Stair training;Therapeutic activities;Therapeutic exercise;Patient/family education;Neuromuscular re-education;Manual techniques;Taping;Dry needling    PT Next Visit Plan  KX: continue to strengthen LE as gyms remain closed; having knee injection Thursday    PT Home Exercise Plan  Access Code: [facility]        Patient will benefit from skilled  therapeutic intervention in order to improve the following deficits and impairments:  Pain, Decreased range of motion, Decreased strength, Decreased activity tolerance, Impaired perceived functional ability, Difficulty walking, Decreased balance  Visit Diagnosis: Chronic pain of right knee  Stiffness of right knee, not elsewhere classified  Muscle weakness (generalized)     Problem List Patient Active Problem List   Diagnosis Date Noted  . Chronic left SI joint pain 04/27/2018  . Right wrist pain 02/05/2018  . Degenerative arthritis of right knee 10/23/2017  . Left leg cellulitis 08/12/2017  . Arthritis of right knee 05/20/2017  . Diverticulosis of colon with hemorrhage   . Acute blood loss anemia   . GIB (gastrointestinal bleeding) 03/12/2017  . Leg edema, right 02/20/2015  . BPH (benign prostatic hyperplasia) 12/09/2014  . Chronic radicular low back pain 08/06/2012  . COPD (chronic obstructive pulmonary disease) ([place]) 04/21/2012  . Syncope 03/29/2012  . Hyponatremia 03/29/2012  . Localized osteoarthrosis not specified whether primary or secondary, lower leg 06/20/2010  . VARICOSE VEINS LOWER EXTREMITIES W/INFLAMMATION 02/12/2010  . [place] LOCALIZED OSTEOARTHROSIS INVOLVING HAND 10/16/2009  . DUPUYTREN'S CONTRACTURE, RIGHT 07/04/2009  . DEGENERATIVE DISC DISEASE, LUMBOSACRAL SPINE W/RADICULOPATHY 06/19/2009  . CARCINOMA, BASAL CELL 02/07/2009  . ANEMIA OF CHRONIC DISEASE 02/07/2009  . BURSITIS, RIGHT SHOULDER 10/07/2008  . CATARACT ASSOCIATED WITH OTHER SYNDROMES 08/01/2008  . DRY SKIN 08/01/2008  . CERUMEN IMPACTION, BILATERAL 03/01/2008  . PMR (polymyalgia rheumatica) ([place]) 01/18/2008  . CRAMP IN LIMB 01/18/2008  . DEPRESSION, SITUATIONAL, ACUTE 12/28/2007  . CONSTIPATION, DRUG INDUCED 12/28/2007  . POLYNEUROPATHY OTHER DISEASES CLASSIFIED ELSW 09/24/2007  . WEIGHT LOSS, ABNORMAL 01/07/2007  . Hyperlipidemia 09/22/2006  . Essential hypertension 09/22/2006  . GERD  (gastroesophageal reflux disease) 09/22/2006  . Osteoarthritis 09/22/2006   Ruben Im, PT 08/25/18 8:25 AM Phone: [phone] Fax: [phone] Alvera Singh 08/25/2018, 8:24 AM  [facility] [address], [place], [place], [address] Phone: [phone]   Fax:  [phone]  Name: Darin [name] MRN: [id number] Date of Birth: [birth year]

## 2018-08-27 ENCOUNTER — Ambulatory Visit (INDEPENDENT_AMBULATORY_CARE_PROVIDER_SITE_OTHER): Payer: Self-pay | Admitting: Family Medicine

## 2018-08-27 ENCOUNTER — Encounter: Payer: Self-pay | Admitting: Family Medicine

## 2018-08-27 ENCOUNTER — Other Ambulatory Visit: Payer: Self-pay

## 2018-08-27 DIAGNOSIS — M5126 Other intervertebral disc displacement, lumbar region: Secondary | ICD-10-CM

## 2018-08-27 DIAGNOSIS — M1711 Unilateral primary osteoarthritis, right knee: Secondary | ICD-10-CM

## 2018-08-27 NOTE — Progress Notes (Signed)
[name] [place], [place] [address] Phone: [phone] Subjective:     CC: Back pain and knee pain,  [id number]  Darin [name] is a 85 y.o. male coming in with complaint of right knee pain. Constant pain. Seems to have gotten worse. Is unable to walk as easily. Did want to get another PRP injection. Last PRP injection December 2019.  Did have epidural which helped to alleviate his pain in his back. Is having hip pain but believes it is from arthritis.    Patient did have epidural of the back done Aug 10, 2018 after MRI of the lumbar spine showed with progressive anterior listhesis at L4-L5 causing moderate spinal and foraminal stenosis.  There was a lytic lesion with PSA normal.  Past Medical History:  Diagnosis Date  . Arthritis   . GERD (gastroesophageal reflux disease)   . H/O hiatal hernia    "FOR YEARS" " NO PROBLEM"  . Hyperlipidemia   . Hypertension   . Knee pain   . Neuropathy    PERIPHERAL   . Osteoporosis   . PMR (polymyalgia rheumatica) (HCC)   . Polymyalgia ([employer]) 5 YEARS   Past Surgical History:  Procedure Laterality Date  . CARPAL TUNNEL RELEASE    . CATARACT EXTRACTION     right  . COLONOSCOPY  2007  . INGUINAL HERNIA REPAIR  10/14/2011   Procedure: HERNIA REPAIR INGUINAL ADULT;  Surgeon: [name], MD;  Location: [place];  Service: General;  Laterality: Right;  Right Inguinal Hernia Repair with Mesh  . TONSILLECTOMY    . VARICOSE VEIN SURGERY     Social History   Socioeconomic History  . Marital status: Widowed    Spouse name: Not on file  . Number of children: Not on file  . Years of education: Not on file  . Highest education level: Not on file  Occupational History  . Occupation: retired    Fish farm manager: RETIRED  Social Needs  . Financial resource strain: Not on file  . Food insecurity:    Worry: Not on file    Inability: Not on file  . Transportation needs:    Medical: Not on file   Non-medical: Not on file  Tobacco Use  . Smoking status: Never Smoker  . Smokeless tobacco: Never Used  Substance and Sexual Activity  . Alcohol use: Yes    Alcohol/week: 3.0 standard drinks    Types: 3 Glasses of wine per week    Comment: half glass wine before dinner  . Drug use: No  . Sexual activity: Yes  Lifestyle  . Physical activity:    Days per week: Not on file    Minutes per session: Not on file  . Stress: Not on file  Relationships  . Social connections:    Talks on phone: Not on file    Gets together: Not on file    Attends religious service: Not on file    Active member of club or organization: Not on file    Attends meetings of clubs or organizations: Not on file    Relationship status: Not on file  Other Topics Concern  . Not on file  Social History Narrative  . Not on file   Allergies  Allergen Reactions  . Statins Other (See Comments)    Muscle pain and upset stomach   Family History  Problem Relation Age of Onset  . Heart disease Father   . Coronary artery disease  Other     Current Outpatient Medications (Endocrine & Metabolic):  .  predniSONE (DELTASONE) 50 MG tablet, Take 1 tablet (50 mg total) by mouth daily.  Current Outpatient Medications (Cardiovascular):  .  amLODipine (NORVASC) 5 MG tablet, TAKE 1 TABLET DAILY .  benazepril-hydrochlorthiazide (LOTENSIN HCT) 10-12.5 MG tablet, TAKE 1 TABLET DAILY        *PATIENT NEEDS AN          APPOINTMENT*   Current Outpatient Medications (Analgesics):  .  aspirin 81 MG tablet, Take 81 mg by mouth daily.    Current Outpatient Medications (Other):  .  famotidine (PEPCID) 20 MG tablet, TAKE 1 TABLET AT BEDTIME .  finasteride (PROSCAR) 5 MG tablet, TAKE 1 TABLET EACH DAY. Marland Kitchen  gabapentin (NEURONTIN) 100 MG capsule, TAKE 1 CAPSULE 4 TIMES     DAILY .  niacin 500 MG tablet, Take 500 mg by mouth 2 (two) times daily with a meal.  .  polyethylene glycol (MIRALAX / GLYCOLAX) packet, Take 17 g by mouth daily as  needed for moderate constipation. .  triamcinolone ointment (KENALOG) 0.1 %, Apply 1 application topically 2 (two) times daily. To affected area .  vitamin C (ASCORBIC ACID) 500 MG tablet, Take 500 mg by mouth daily.    Past medical history, social, surgical and family history all reviewed in electronic medical record.  No pertanent information unless stated regarding to the chief complaint.   Review of Systems:  No headache, visual changes, nausea, vomiting, diarrhea, constipation, dizziness, abdominal pain, skin rash, fevers, chills, night sweats, weight loss, swollen lymph nodes, body aches, , chest pain, shortness of breath, mood changes.  Positive muscle aches and joint swelling  Objective  Blood pressure 116/62, pulse 76, height 6\' 1"  (1.854 m), SpO2 96 %.    General: No apparent distress alert and oriented x3 mood and affect normal, dressed appropriately.  HEENT: Pupils equal, extraocular movements intact  Respiratory: Patient's speak in full sentences and does not appear short of breath  Cardiovascular: Trace lower extremity edema, non tender, no erythema  Skin: Warm dry intact with no signs of infection or rash on extremities or on axial skeleton.  Abdomen: Soft nontender  Neuro: Cranial nerves II through XII are intact, neurovascularly intact in all extremities with 2+ DTRs and 2+ pulses.  Lymph: No lymphadenopathy of posterior or anterior cervical chain or axillae bilaterally.  Gait antalgic walking with the aid of a walker  Knee: Right valgus deformity noted.  Abnormal thigh to calf ratio.  Tender to palpation over medial and PF joint line.  ROM full in flexion and extension and lower leg rotation. instability with valgus force.  painful patellar compression. Patellar glide with moderate crepitus. Patellar and quadriceps tendons unremarkable. Hamstring and quadriceps strength is normal. Contralateral knee shows   After informed written and verbal consent, patient was  seated on exam table. Right knee was prepped with alcohol swab and utilizing anterolateral approach, patient's right knee space was injected with 0.5% Marcaine and then injected 5 cc of pre-centrifuge PRP. Patient tolerated the procedure well without immediate complications.   Impression and Recommendations:          Note: This dictation was prepared with Dragon dictation along with smaller phrase technology. Any transcriptional errors that result from this process are unintentional.

## 2018-08-27 NOTE — Assessment & Plan Note (Signed)
.    Patient is doing better after the epidural.  No change in management

## 2018-08-27 NOTE — Assessment & Plan Note (Addendum)
Repeat PRP given again today.  The patient did very well with the last 1 and hopefully will have just as much improvement.  Discussed with patient and icing regimen, home exercises, continue to use the walker.  We will continue to monitor.  Patient can Physical exam.  Discussed with patient about range of motion over the next week

## 2018-08-27 NOTE — Patient Instructions (Signed)
See me when you need me  [phone]

## 2018-08-28 ENCOUNTER — Ambulatory Visit: Payer: Medicare Other | Admitting: Physical Therapy

## 2018-08-28 ENCOUNTER — Encounter: Payer: Self-pay | Admitting: Physical Therapy

## 2018-08-28 ENCOUNTER — Other Ambulatory Visit: Payer: Self-pay

## 2018-08-28 DIAGNOSIS — M25661 Stiffness of right knee, not elsewhere classified: Secondary | ICD-10-CM

## 2018-08-28 DIAGNOSIS — R296 Repeated falls: Secondary | ICD-10-CM | POA: Diagnosis not present

## 2018-08-28 DIAGNOSIS — M25561 Pain in right knee: Secondary | ICD-10-CM | POA: Diagnosis not present

## 2018-08-28 DIAGNOSIS — M6281 Muscle weakness (generalized): Secondary | ICD-10-CM

## 2018-08-28 DIAGNOSIS — G8929 Other chronic pain: Secondary | ICD-10-CM | POA: Diagnosis not present

## 2018-08-28 NOTE — Therapy (Signed)
[facility] [address] [place], [place], [address] Phone: [phone]   Fax:  [phone]  Physical Therapy Treatment  Patient Details  Name: Darin [name] MRN: [id number] Date of Birth: 03/17/1932 Referring Provider (PT): Dr. Hulan Saas    Encounter Date: 08/28/2018  PT End of Session - 08/28/18 0834    Visit Number  29    Date for PT Re-Evaluation  09/17/18    Authorization Type  Medicare     PT Start Time  0803    PT Stop Time  4097    PT Time Calculation (min)  39 min    Activity Tolerance  Patient tolerated treatment well;No increased pain    Behavior During Therapy  WFL for tasks assessed/performed       Past Medical History:  Diagnosis Date  . Arthritis   . GERD (gastroesophageal reflux disease)   . H/O hiatal hernia    "FOR YEARS" " NO PROBLEM"  . Hyperlipidemia   . Hypertension   . Knee pain   . Neuropathy    PERIPHERAL   . Osteoporosis   . PMR (polymyalgia rheumatica) (HCC)   . Polymyalgia ([place]) 5 YEARS    Past Surgical History:  Procedure Laterality Date  . CARPAL TUNNEL RELEASE    . CATARACT EXTRACTION     right  . COLONOSCOPY  2007  . INGUINAL HERNIA REPAIR  10/14/2011   Procedure: HERNIA REPAIR INGUINAL ADULT;  Surgeon: [name], MD;  Location: [facility];  Service: General;  Laterality: Right;  Right Inguinal Hernia Repair with Mesh  . TONSILLECTOMY    . VARICOSE VEIN SURGERY      There were no vitals filed for this visit.  Subjective Assessment - 08/28/18 0805    Subjective  Pt states her got his knee injection yesterday and it is feeling much better today.     Pertinent History  MD follow up in July;  Hoping to go to [place] in July    Diagnostic tests  No cartilage left in my knee.    Currently in Pain?  No/denies                       [facility] Adult PT Treatment/Exercise - 08/28/18 0001      Knee/Hip Exercises: Aerobic   Nustep  seat 13 arms 11  L3 10 min    PT present to follow up with MD appointment     Knee/Hip Exercises: Machines for Strengthening   Cybex Knee Extension  BLE 30# x25 reps     Cybex Knee Flexion  30# + 2# ankle weight 60x     Cybex Leg Press  seat 9: #100 x100 reps ; single leg #45 x60 reps       Knee/Hip Exercises: Standing   Heel Raises  Both;1 set;20 reps    Heel Raises Limitations  3 sec hold     Abduction Limitations  Green TB just below the knee x4 reps down and back along countertop             PT Education - 08/28/18 0842    Education Details  technique with therex    Person(s) Educated  Patient    Methods  Explanation;Verbal cues    Comprehension  Verbalized understanding;Returned demonstration       PT Short Term Goals - 07/23/18 0856      PT SHORT TERM GOAL #1   Title  be independent in  initial HEP      Status  Achieved      PT SHORT TERM GOAL #2   Title  The patient will have improved right knee ROM 3-120 degrees needed for greater ease getting up and down from the chair and in/out of the car    Status  Achieved      PT SHORT TERM GOAL #3   Title  The patient will have improved hip flexor muscle length to 5 degrees and HS length to 65 degrees needed for improved stride length with ambulation    Status  Achieved      PT SHORT TERM GOAL #4   Title  Timed up and Go to 16 sec indicating improved gait safety    Status  Achieved      PT SHORT TERM GOAL #5   Title  Ten meter walk test to 14 sec indicate improved gait speed needed for community ambulation     Time  4    Period  Weeks    Status  On-going    Target Date  09/17/18        PT Long Term Goals - 07/23/18 0838      PT LONG TERM GOAL #1   Title  be independent in advanced HEP for further improvements in strength, ROM and balance      Time  8    Period  Weeks    Status  On-going    Target Date  09/17/18      PT LONG TERM GOAL #2   Title  Right knee ROM improved to 2-125 degrees needed for greater mobility      Time  8    Status  On-going      PT LONG TERM GOAL #3   Title  Improved right knee extension strength to 4+/5 and knee flexion strength to 5-/5 needed for greater ease rising    Time  8    Period  Weeks    Status  On-going      PT LONG TERM GOAL #4   Title  The patient will be able to walk short distances 50 feet or less with 2 canes/poles    Status  New      PT LONG TERM GOAL #5   Title  The patient will be able to ambulate 500 feet in six minutes with RW needed for shorter distance community ambulation for traveling    Time  8    Period  Weeks    Status  Revised            Plan - 08/28/18 0846    Clinical Impression Statement  Pt reported no Rt knee pain upon arrival following his knee injection yesterday. Session focused on therex progressions to increase knee/hip strength. Pt required some cuing to focus on executing the leg press through his full range of motion. He was able to complete side stepping with the theraband adjusted around his knees. No increase in pain was noted following today's exercises. Will continue to progress LE strength and flexibility as appropriate for improved pain with daily activity.     Rehab Potential  Good    Clinical Impairments Affecting Rehab Potential  risk for falls    PT Frequency  2x / week    PT Duration  8 weeks    PT Treatment/Interventions  ADLs/Self Care Home Management;Cryotherapy;Electrical Stimulation;Ultrasound;Moist Heat;Iontophoresis 4mg /ml Dexamethasone;Gait training;Stair training;Therapeutic activities;Therapeutic exercise;Patient/family education;Neuromuscular re-education;Manual techniques;Taping;Dry needling    PT Next Visit Plan  KX: continue to strengthen LE as gyms remain closed    PT Home Exercise Plan  Access Code: [email or web address]        Patient will benefit from skilled therapeutic intervention in order to improve the following deficits and impairments:  Pain, Decreased range of motion, Decreased strength, Decreased  activity tolerance, Impaired perceived functional ability, Difficulty walking, Decreased balance  Visit Diagnosis: Chronic pain of right knee  Stiffness of right knee, not elsewhere classified  Muscle weakness (generalized)  Repeated falls     Problem List Patient Active Problem List   Diagnosis Date Noted  . Chronic left SI joint pain 04/27/2018  . Right wrist pain 02/05/2018  . Degenerative arthritis of right knee 10/23/2017  . Left leg cellulitis 08/12/2017  . Arthritis of right knee 05/20/2017  . Diverticulosis of colon with hemorrhage   . Acute blood loss anemia   . GIB (gastrointestinal bleeding) 03/12/2017  . Leg edema, right 02/20/2015  . BPH (benign prostatic hyperplasia) 12/09/2014  . Chronic radicular low back pain 08/06/2012  . COPD (chronic obstructive pulmonary disease) ([place]) 04/21/2012  . Syncope 03/29/2012  . Hyponatremia 03/29/2012  . Localized osteoarthrosis not specified whether primary or secondary, lower leg 06/20/2010  . VARICOSE VEINS LOWER EXTREMITIES W/INFLAMMATION 02/12/2010  . [place] LOCALIZED OSTEOARTHROSIS INVOLVING HAND 10/16/2009  . DUPUYTREN'S CONTRACTURE, RIGHT 07/04/2009  . DEGENERATIVE DISC DISEASE, LUMBOSACRAL SPINE W/RADICULOPATHY 06/19/2009  . CARCINOMA, BASAL CELL 02/07/2009  . ANEMIA OF CHRONIC DISEASE 02/07/2009  . BURSITIS, RIGHT SHOULDER 10/07/2008  . CATARACT ASSOCIATED WITH OTHER SYNDROMES 08/01/2008  . DRY SKIN 08/01/2008  . CERUMEN IMPACTION, BILATERAL 03/01/2008  . PMR (polymyalgia rheumatica) ([place]) 01/18/2008  . CRAMP IN LIMB 01/18/2008  . DEPRESSION, SITUATIONAL, ACUTE 12/28/2007  . CONSTIPATION, DRUG INDUCED 12/28/2007  . POLYNEUROPATHY OTHER DISEASES CLASSIFIED ELSW 09/24/2007  . WEIGHT LOSS, ABNORMAL 01/07/2007  . Hyperlipidemia 09/22/2006  . Essential hypertension 09/22/2006  . GERD (gastroesophageal reflux disease) 09/22/2006  . Osteoarthritis 09/22/2006      8:51 AM,08/28/18 Sherol Dade PT, DPT [facility] [address] [place], [place], [address] Phone: [phone]   Fax:  [phone]  Name: Darin [name] MRN: [id number] Date of Birth: June 14, 1931

## 2018-09-01 ENCOUNTER — Encounter: Payer: Self-pay | Admitting: Physical Therapy

## 2018-09-01 ENCOUNTER — Ambulatory Visit: Payer: Medicare Other | Admitting: Physical Therapy

## 2018-09-01 ENCOUNTER — Other Ambulatory Visit: Payer: Self-pay

## 2018-09-01 DIAGNOSIS — M25561 Pain in right knee: Secondary | ICD-10-CM | POA: Diagnosis not present

## 2018-09-01 DIAGNOSIS — M25661 Stiffness of right knee, not elsewhere classified: Secondary | ICD-10-CM

## 2018-09-01 DIAGNOSIS — M6281 Muscle weakness (generalized): Secondary | ICD-10-CM | POA: Diagnosis not present

## 2018-09-01 DIAGNOSIS — G8929 Other chronic pain: Secondary | ICD-10-CM

## 2018-09-01 DIAGNOSIS — R296 Repeated falls: Secondary | ICD-10-CM | POA: Diagnosis not present

## 2018-09-01 NOTE — Therapy (Signed)
[facility] [address] [place], [place], [address] Phone: [phone]   Fax:  [phone]  Physical Therapy Treatment  Patient Details  Name: Darin [name] MRN: [id number] Date of Birth: 1931/11/15 Referring Provider (PT): Dr. Hulan Saas     Progress Note Reporting Period 06/09/2018 to 09/01/2018  See note below for Objective Data and Assessment of Progress/Goals.      Encounter Date: 09/01/2018  PT End of Session - 09/01/18 0845    Visit Number  30    Date for PT Re-Evaluation  09/17/18    Authorization Type  Medicare     PT Start Time  0817    PT Stop Time  0900    PT Time Calculation (min)  43 min    Activity Tolerance  Patient tolerated treatment well       Past Medical History:  Diagnosis Date  . Arthritis   . GERD (gastroesophageal reflux disease)   . H/O hiatal hernia    "FOR YEARS" " NO PROBLEM"  . Hyperlipidemia   . Hypertension   . Knee pain   . Neuropathy    PERIPHERAL   . Osteoporosis   . PMR (polymyalgia rheumatica) (HCC)   . Polymyalgia ([place]) 5 YEARS    Past Surgical History:  Procedure Laterality Date  . CARPAL TUNNEL RELEASE    . CATARACT EXTRACTION     right  . COLONOSCOPY  2007  . INGUINAL HERNIA REPAIR  10/14/2011   Procedure: HERNIA REPAIR INGUINAL ADULT;  Surgeon: [name], MD;  Location: [facility];  Service: General;  Laterality: Right;  Right Inguinal Hernia Repair with Mesh  . TONSILLECTOMY    . VARICOSE VEIN SURGERY      There were no vitals filed for this visit.  Subjective Assessment - 09/01/18 0820    Subjective  States his knee is not any better since the injection.  It aches all the time.  Reports he did fine with his exercises last time.      Currently in Pain?  Yes    Pain Score  8     Pain Location  Knee    Pain Orientation  Right    Pain Type  Chronic pain         [facility] PT Assessment - 09/01/18 0001      AROM   Right Knee Extension  0    Right Knee Flexion  115    Left Knee Extension  0    Left Knee Flexion  130      Strength   Right Hip Flexion  4+/5    Right Knee Flexion  4+/5    Right Knee Extension  4/5    Left Knee Flexion  4+/5    Left Knee Extension  4/5                   [facility] Adult PT Treatment/Exercise - 09/01/18 0001      Knee/Hip Exercises: Stretches   Sports administrator Limitations  on 2nd step 5x right/left   right knee giveway with left foot on step     Knee/Hip Exercises: Aerobic   Nustep  seat 13 arms 11 L3 10 min    PT present to follow up with MD appointment     Knee/Hip Exercises: Machines for Strengthening   Cybex Knee Extension  BLE 30#   x30 reps     Cybex Knee Flexion  30# + 2# ankle weight 80x  Cybex Leg Press  seat 9: #100 x100 reps ; single leg #45 x60 reps       Knee/Hip Exercises: Standing   Heel Raises  Both;1 set;10 reps    Abduction Limitations  blue band side step x4 reps down and back along treadmill                 PT Short Term Goals - 09/01/18 0912      PT SHORT TERM GOAL #1   Title  be independent in initial HEP      Status  Achieved      PT SHORT TERM GOAL #2   Title  The patient will have improved right knee ROM 3-120 degrees needed for greater ease getting up and down from the chair and in/out of the car    Status  Partially Met      PT SHORT TERM GOAL #3   Title  The patient will have improved hip flexor muscle length to 5 degrees and HS length to 65 degrees needed for improved stride length with ambulation    Status  Achieved      PT SHORT TERM GOAL #4   Title  Timed up and Go to 16 sec indicating improved gait safety    Status  Achieved      PT SHORT TERM GOAL #5   Title  Ten meter walk test to 14 sec indicate improved gait speed needed for community ambulation     Time  4    Period  Weeks    Status  On-going        PT Long Term Goals - 09/01/18 0913      PT LONG TERM GOAL #1   Title  be independent in advanced HEP for further  improvements in strength, ROM and balance      Time  8    Period  Weeks    Status  On-going      PT LONG TERM GOAL #2   Title  Right knee ROM improved to 2-125 degrees needed for greater mobility     Time  8    Period  Weeks    Status  On-going      PT LONG TERM GOAL #3   Title  Improved right knee extension strength to 4+/5 and knee flexion strength to 5-/5 needed for greater ease rising    Time  8    Period  Weeks    Status  On-going      PT LONG TERM GOAL #4   Title  The patient will be able to walk short distances 50 feet or less with 2 canes/poles    Time  8    Period  Weeks    Status  On-going      PT LONG TERM GOAL #5   Title  The patient will be able to ambulate 500 feet in six minutes with RW needed for shorter distance community ambulation for traveling    Time  8    Period  Weeks    Status  On-going            Plan - 09/01/18 0850    Clinical Impression Statement  The patient continues to have right knee pain and 2 episodes of right knee buckling with full weight bearing during treatment session.  Despite the moderate to high level of pain intensity, he continues to be highly motivated with progressive strengthening.  Much improved hip strength bilaterally and improved HS  strength however pain is a limiting factor with right quad strength.  No improvement in right knee flexion ROM.  He uses a RW on a full time basis secondary to knee instability and risk of falls.  He would benefit from continued PT to prevent a decline in functional status.   He is unable to do these exercises on his own secondary to safety concerns and lack of appropriate equipment.  All gyms continue to be closed due to the pandemic.      Rehab Potential  Good    Clinical Impairments Affecting Rehab Potential  risk for falls    PT Frequency  2x / week    PT Duration  8 weeks    PT Treatment/Interventions  ADLs/Self Care Home Management;Cryotherapy;Electrical Stimulation;Ultrasound;Moist  Heat;Iontophoresis 7m/ml Dexamethasone;Gait training;Stair training;Therapeutic activities;Therapeutic exercise;Patient/family education;Neuromuscular re-education;Manual techniques;Taping;Dry needling    PT Next Visit Plan  KX: progressive bil LE strengthening; do 10 m walk test and TUG;  Six minute walk test    PT Home Exercise Plan  Access Code: [email or web address]        Patient will benefit from skilled therapeutic intervention in order to improve the following deficits and impairments:  Pain, Decreased range of motion, Decreased strength, Decreased activity tolerance, Impaired perceived functional ability, Difficulty walking, Decreased balance  Visit Diagnosis: Chronic pain of right knee  Stiffness of right knee, not elsewhere classified  Muscle weakness (generalized)     Problem List Patient Active Problem List   Diagnosis Date Noted  . Chronic left SI joint pain 04/27/2018  . Right wrist pain 02/05/2018  . Degenerative arthritis of right knee 10/23/2017  . Left leg cellulitis 08/12/2017  . Arthritis of right knee 05/20/2017  . Diverticulosis of colon with hemorrhage   . Acute blood loss anemia   . GIB (gastrointestinal bleeding) 03/12/2017  . Leg edema, right 02/20/2015  . BPH (benign prostatic hyperplasia) 12/09/2014  . Chronic radicular low back pain 08/06/2012  . COPD ([place] obstructive pulmonary disease) ([place] 04/21/2012  . Syncope 03/29/2012  . Hyponatremia 03/29/2012  . Localized osteoarthrosis not specified whether primary or secondary, lower leg 06/20/2010  . VARICOSE VEINS LOWER EXTREMITIES W/INFLAMMATION 02/12/2010  . [place]STE[place] 10/16/2009  . DUPUYTREN'S CONTRACTURE, RIGHT 07/04/2009  . DEGENERATIVE DISC DISEASE, LUMBOSACRAL SPINE W/RADICULOPATHY 06/19/2009  . CARCINOMA, BASAL CELL 02/07/2009  . ANEMIA OF CHRONIC DISEASE 02/07/2009  . BURSITIS, RIGHT SHOULDER 10/07/2008  . CATARACT ASSOCIATED WITH OTHER SYNDROMES 08/01/2008  . DRY  SKIN 08/01/2008  . CERUMEN IMPACTION, BILATERAL 03/01/2008  . PM[place] ([place]algia [place]a) ([place] 01/18/[place]  . CRAMP IN LIMB 01/18/2008  . DEPRESSION, SITUATIONAL, ACUTE 12/28/2007  . CONSTIPATION, DRUG INDUCED 12/28/2007  . POLYNEUROPATHY OTHER DISEASES CLASSIFIED ELSW 09/24/2007  . WEIGHT LOSS, ABNORMAL 01/07/2007  . Hyperlipidemia 09/22/2006  . Essential hypertension 09/22/2006  . GERD (gastroesophageal reflux disease) 09/22/2006  . Osteoarthritis 09/22/2006   SRuben Im PT 09/01/18 9:18 AM Phone: [phone]: [phone] SAlvera Singh6/11/2018, 9:16 AM  [facility] [address] [place] [address]Phone: [phone]  Fax:  [phone] Name: PMatin MattioliMRN: [id number] of Birth: [birth year]

## 2018-09-04 ENCOUNTER — Other Ambulatory Visit: Payer: Self-pay

## 2018-09-04 ENCOUNTER — Ambulatory Visit: Payer: Medicare Other | Admitting: Physical Therapy

## 2018-09-04 ENCOUNTER — Encounter: Payer: Self-pay | Admitting: Physical Therapy

## 2018-09-04 DIAGNOSIS — R296 Repeated falls: Secondary | ICD-10-CM | POA: Diagnosis not present

## 2018-09-04 DIAGNOSIS — G8929 Other chronic pain: Secondary | ICD-10-CM

## 2018-09-04 DIAGNOSIS — M25661 Stiffness of right knee, not elsewhere classified: Secondary | ICD-10-CM

## 2018-09-04 DIAGNOSIS — M6281 Muscle weakness (generalized): Secondary | ICD-10-CM

## 2018-09-04 DIAGNOSIS — M25561 Pain in right knee: Secondary | ICD-10-CM | POA: Diagnosis not present

## 2018-09-04 NOTE — Therapy (Signed)
[facility] [address] [place], [place], [address] Phone: [phone]   Fax:  [phone]  Physical Therapy Treatment  Patient Details  Name: Darin [name] MRN: [id number] Date of Birth: [birth year] Referring Provider (PT): Dr. Hulan Saas    Encounter Date: 09/04/2018  PT End of Session - 09/04/18 0811    Visit Number  31    Date for PT Re-Evaluation  09/17/18    Authorization Type  Medicare     PT Start Time  0805    Activity Tolerance  Patient tolerated treatment well       Past Medical History:  Diagnosis Date  . Arthritis   . GERD (gastroesophageal reflux disease)   . H/O hiatal hernia    "FOR YEARS" " NO PROBLEM"  . Hyperlipidemia   . Hypertension   . Knee pain   . Neuropathy    PERIPHERAL   . Osteoporosis   . PMR (polymyalgia rheumatica) (HCC)   . Polymyalgia ([place]) 5 YEARS    Past Surgical History:  Procedure Laterality Date  . CARPAL TUNNEL RELEASE    . CATARACT EXTRACTION     right  . COLONOSCOPY  2007  . INGUINAL HERNIA REPAIR  10/14/2011   Procedure: HERNIA REPAIR INGUINAL ADULT;  Surgeon: [name], MD;  Location: [facility];  Service: General;  Laterality: Right;  Right Inguinal Hernia Repair with Mesh  . TONSILLECTOMY    . VARICOSE VEIN SURGERY      There were no vitals filed for this visit.  Subjective Assessment - 09/04/18 0807    Subjective  Pt states that things are going ok. His knee is better but still bothers him during the day.    Currently in Pain?  No/denies         [facility] PT Assessment - 09/04/18 0001      6 Minute Walk- Baseline   Perceived Rate of Exertion ([place])  11- Fairly light      6 minute walk test results    Aerobic Endurance Distance Walked  425    Endurance additional comments  RW, short steps      Timed Up and Go Test   TUG Comments  23 sec, RW                   [facility] Adult PT Treatment/Exercise - 09/04/18 0001      Knee/Hip  Exercises: Aerobic   Nustep  seat 13 arms 11 L3 10 min    PT present to discuss results of functional tests     Knee/Hip Exercises: Machines for Strengthening   Cybex Knee Extension  BLE 30#  x30 reps    pt unable to fully extend the knee    Cybex Knee Flexion  30# + 2# ankle weight 80x     Cybex Leg Press  seat 9: #110 2x25 reps ; single leg #45 x70 reps              PT Education - 09/04/18 0811    Education Details  results of functional testing    Person(s) Educated  Patient    Methods  Explanation    Comprehension  Verbalized understanding       PT Short Term Goals - 09/01/18 0912      PT SHORT TERM GOAL #1   Title  be independent in initial HEP      Status  Achieved      PT SHORT TERM GOAL #2  Title  The patient will have improved right knee ROM 3-120 degrees needed for greater ease getting up and down from the chair and in/out of the car    Status  Partially Met      PT [place] #3   Title  The patient will have improved hip flexor muscle length to 5 degrees and HS length to 65 degrees needed for improved stride length with ambulation    Status  Achieved      PT SHORT TERM GOAL #4   Title  Timed up and Go to 16 sec indicating improved gait safety    Status  Achieved      PT SHORT TERM GOAL #5   Title  Ten meter walk test to 14 sec indicate improved gait speed needed for community ambulation     Time  4    Period  Weeks    Status  On-going        PT Long Term Goals - 09/01/18 0913      PT LONG TERM GOAL #1   Title  be independent in advanced HEP for further improvements in strength, ROM and balance      Time  8    Period  Weeks    Status  On-going      PT LONG TERM GOAL #2   Title  Right knee ROM improved to 2-125 degrees needed for greater mobility     Time  8    Period  Weeks    Status  On-going      PT LONG TERM GOAL #3   Title  Improved right knee extension strength to 4+/5 and knee flexion strength to 5-/5 needed for greater ease  rising    Time  8    Period  Weeks    Status  On-going      PT LONG TERM GOAL #4   Title  The patient will be able to walk short distances 50 feet or less with 2 canes/poles    Time  8    Period  Weeks    Status  On-going      PT LONG TERM GOAL #5   Title  The patient will be able to ambulate 500 feet in six minutes with RW needed for shorter distance community ambulation for traveling    Time  8    Period  Weeks    Status  On-going            Plan - 09/04/18 2025    Clinical Impression Statement  Pt completed the TUG and 6 minute walk test this session. He was having a more difficult time with walking and was unable to complete his warm up prior to the start which could have also resulted in increased time to complete the TUG compared to previous sessions. Continued with LE strength progressions this visit, pt able to increase leg press by 10 pounds and required some adjustments to sets and repetitions with this due to increased difficulty. Ended session without reports of increased knee pain.    Rehab Potential  Good    Clinical Impairments Affecting Rehab Potential  risk for falls    PT Frequency  2x / week    PT Duration  8 weeks    PT Treatment/Interventions  ADLs/Self Care Home Management;Cryotherapy;Electrical Stimulation;Ultrasound;Moist Heat;Iontophoresis 46m/ml Dexamethasone;Gait training;Stair training;Therapeutic activities;Therapeutic exercise;Patient/family education;Neuromuscular re-education;Manual techniques;Taping;Dry needling    PT Next Visit Plan  KX: progressive bil LE strengthening; focus on knee extension  and flexion through full range    PT Home Exercise Plan  Access Code: [email or web address]        Patient will benefit from skilled therapeutic intervention in order to improve the following deficits and impairments:  Pain, Decreased range of motion, Decreased strength, Decreased activity tolerance, Impaired perceived functional ability, Difficulty walking, Decreased  balance  Visit Diagnosis: Chronic pain of right knee  Stiffness of right knee, not elsewhere classified  Muscle weakness (generalized)  Repeated falls     Problem List Patient Active Problem List   Diagnosis Date Noted  . Chronic left SI joint pain 04/27/2018  . Right wrist pain 02/05/2018  . Degenerative arthritis of right knee 10/23/2017  . Left leg cellulitis 08/12/2017  . Arthritis of right knee 05/20/2017  . Diverticulosis of colon with hemorrhage   . Acute blood loss anemia   . GIB (gastrointestinal bleeding) 03/12/2017  . Leg edema, right 02/20/2015  . BPH (benign prostatic hyperplasia) 12/09/2014  . Chronic radicular low back pain 08/06/2012  . COPD (chronic obstructive pulmonary disease) ([place]) 04/21/2012  . Syncope 03/29/2012  . Hyponatremia 03/29/2012  . Localized osteoarthrosis not specified whether primary or secondary, lower leg 06/20/2010  . VARICOSE VEINS LOWER EXTREMITIES W/INFLAMMATION 02/12/2010  . [place] LOCALIZED OSTEOARTHROSIS INVOLVING HAND 10/16/2009  . DUPUYTREN'S CONTRACTURE, RIGHT 07/04/2009  . DEGENERATIVE DISC DISEASE, LUMBOSACRAL SPINE W/RADICULOPATHY 06/19/2009  . CARCINOMA, BASAL CELL 02/07/2009  . ANEMIA OF CHRONIC DISEASE 02/07/2009  . BURSITIS, RIGHT SHOULDER 10/07/2008  . CATARACT ASSOCIATED WITH OTHER SYNDROMES 08/01/2008  . DRY SKIN 08/01/2008  . CERUMEN IMPACTION, BILATERAL 03/01/2008  . PMR (polymyalgia rheumatica) ([place]) 01/18/2008  . CRAMP IN LIMB 01/18/2008  . DEPRESSION, SITUATIONAL, ACUTE 12/28/2007  . CONSTIPATION, DRUG INDUCED 12/28/2007  . POLYNEUROPATHY OTHER DISEASES CLASSIFIED ELSW 09/24/2007  . WEIGHT LOSS, ABNORMAL 01/07/2007  . Hyperlipidemia 09/22/2006  . Essential hypertension 09/22/2006  . GERD (gastroesophageal reflux disease) 09/22/2006  . Osteoarthritis 09/22/2006    8:46 AM,09/04/18 Sherol Dade PT, DPT [place] [facility] [place]  [facility] [address] [place], [place], [address] Phone: [phone]   Fax:  [phone]  Name: Taegen Delker MRN: [id number] Date of Birth: 1931/05/14

## 2018-09-08 ENCOUNTER — Encounter: Payer: Self-pay | Admitting: Physical Therapy

## 2018-09-08 ENCOUNTER — Ambulatory Visit: Payer: Medicare Other | Admitting: Physical Therapy

## 2018-09-08 ENCOUNTER — Other Ambulatory Visit: Payer: Self-pay

## 2018-09-08 DIAGNOSIS — G8929 Other chronic pain: Secondary | ICD-10-CM | POA: Diagnosis not present

## 2018-09-08 DIAGNOSIS — M25661 Stiffness of right knee, not elsewhere classified: Secondary | ICD-10-CM | POA: Diagnosis not present

## 2018-09-08 DIAGNOSIS — M6281 Muscle weakness (generalized): Secondary | ICD-10-CM

## 2018-09-08 DIAGNOSIS — M25561 Pain in right knee: Secondary | ICD-10-CM | POA: Diagnosis not present

## 2018-09-08 DIAGNOSIS — R296 Repeated falls: Secondary | ICD-10-CM | POA: Diagnosis not present

## 2018-09-08 NOTE — Therapy (Signed)
[facility] [address], [place], [place], [address] Phone: [phone]   Fax:  [phone]  Physical Therapy Treatment  Patient Details  Name: Darin [name] MRN: [id number] Date of Birth: [birth year] Referring Provider (PT): Dr. Hulan Saas    Encounter Date: 09/08/2018  PT End of Session - 09/08/18 0830    Visit Number  32    Date for PT Re-Evaluation  09/17/18    Authorization Type  Medicare     PT Start Time  0824    PT Stop Time  0905    PT Time Calculation (min)  41 min    Activity Tolerance  Patient tolerated treatment well       Past Medical History:  Diagnosis Date  . Arthritis   . GERD (gastroesophageal reflux disease)   . H/O hiatal hernia    "FOR YEARS" " NO PROBLEM"  . Hyperlipidemia   . Hypertension   . Knee pain   . Neuropathy    PERIPHERAL   . Osteoporosis   . PMR (polymyalgia rheumatica) (HCC)   . Polymyalgia ([place]) 5 YEARS    Past Surgical History:  Procedure Laterality Date  . CARPAL TUNNEL RELEASE    . CATARACT EXTRACTION     right  . COLONOSCOPY  2007  . INGUINAL HERNIA REPAIR  10/14/2011   Procedure: HERNIA REPAIR INGUINAL ADULT;  Surgeon: [name], MD;  Location: [facility];  Service: General;  Laterality: Right;  Right Inguinal Hernia Repair with Mesh  . TONSILLECTOMY    . VARICOSE VEIN SURGERY      There were no vitals filed for this visit.  Subjective Assessment - 09/08/18 0825    Subjective  I'm sore today.  I think it's the weather.  I was sore Friday too and I think that's why I had trouble with those tests (TUG and 6 MWT).  The knee give-way is the same.    Diagnostic tests  No cartilage left in my knee.    Currently in Pain?  Yes    Pain Score  8     Pain Location  Knee    Pain Orientation  Right    Pain Type  Chronic pain    Aggravating Factors   walking                       [facility] Adult PT Treatment/Exercise - 09/08/18 0001       Knee/Hip Exercises: Aerobic   Nustep  seat 13 arms 11 L3 10 min    PT present to discuss pain and response to previous treatment      Knee/Hip Exercises: Machines for Strengthening   Cybex Knee Extension  BLE 30#  x25 reps    pt unable to fully extend the knee    Cybex Knee Flexion  30# + 2# ankle weight 100x    Cybex Leg Press  seat 9: #110 100x ; single leg #45 x50 reps   seat reclined back ;  pain with right knee extension      Knee/Hip Exercises: Standing   Heel Raises  Both;2 sets;20 reps    Abduction Limitations  blue band side step x4 reps down and back along treadmill        Knee/Hip Exercises: Seated   Clamshell with TheraBand  Blue   30x              PT Short Term Goals - 09/01/18 2951  PT SHORT TERM GOAL #1   Title  be independent in initial HEP      Status  Achieved      PT SHORT TERM GOAL #2   Title  The patient will have improved right knee ROM 3-120 degrees needed for greater ease getting up and down from the chair and in/out of the car    Status  Partially Met      PT SHORT TERM GOAL #3   Title  The patient will have improved hip flexor muscle length to 5 degrees and HS length to 65 degrees needed for improved stride length with ambulation    Status  Achieved      PT SHORT TERM GOAL #4   Title  Timed up and Go to 16 sec indicating improved gait safety    Status  Achieved      PT SHORT TERM GOAL #5   Title  Ten meter walk test to 14 sec indicate improved gait speed needed for community ambulation     Time  4    Period  Weeks    Status  On-going        PT Long Term Goals - 09/01/18 0913      PT LONG TERM GOAL #1   Title  be independent in advanced HEP for further improvements in strength, ROM and balance      Time  8    Period  Weeks    Status  On-going      PT LONG TERM GOAL #2   Title  Right knee ROM improved to 2-125 degrees needed for greater mobility     Time  8    Period  Weeks    Status  On-going      PT LONG TERM GOAL #3    Title  Improved right knee extension strength to 4+/5 and knee flexion strength to 5-/5 needed for greater ease rising    Time  8    Period  Weeks    Status  On-going      PT LONG TERM GOAL #4   Title  The patient will be able to walk short distances 50 feet or less with 2 canes/poles    Time  8    Period  Weeks    Status  On-going      PT LONG TERM GOAL #5   Title  The patient will be able to ambulate 500 feet in six minutes with RW needed for shorter distance community ambulation for traveling    Time  8    Period  Weeks    Status  On-going            Plan - 09/08/18 0844    Clinical Impression Statement  The patient continues to be highly motivated and is able to continue with a progressive LE strengthening program despite knee pain and instability.  He is limited in standing tolerance secondary to pain and general fatigue.  Therapist modifying seat positions secondary to difficulty lifting his leg on /off some of the equipment.  The patient limits his knee extension more today than usual secondary to right knee pain.    Rehab Potential  Good    Clinical Impairments Affecting Rehab Potential  risk for falls    PT Frequency  2x / week    PT Duration  8 weeks    PT Treatment/Interventions  ADLs/Self Care Home Management;Cryotherapy;Electrical Stimulation;Ultrasound;Moist Heat;Iontophoresis 46m/ml Dexamethasone;Gait training;Stair training;Therapeutic activities;Therapeutic exercise;Patient/family education;Neuromuscular re-education;Manual techniques;Taping;Dry needling  PT Next Visit Plan  KX: progressive bil LE strengthening; focus on knee extension and flexion through full range    PT Home Exercise Plan  Access Code: [email or web address]        Patient will benefit from skilled therapeutic intervention in order to improve the following deficits and impairments:  Pain, Decreased range of motion, Decreased strength, Decreased activity tolerance, Impaired perceived functional ability,  Difficulty walking, Decreased balance  Visit Diagnosis: 1. Chronic pain of right knee   2. Stiffness of right knee, not elsewhere classified   3. Muscle weakness (generalized)        Problem List Patient Active Problem List   Diagnosis Date Noted  . Chronic left SI joint pain 04/27/2018  . Right wrist pain 02/05/2018  . Degenerative arthritis of right knee 10/23/2017  . Left leg cellulitis 08/12/2017  . Arthritis of right knee 05/20/2017  . Diverticulosis of colon with hemorrhage   . Acute blood loss anemia   . GIB (gastrointestinal bleeding) 03/12/2017  . Leg edema, right 02/20/2015  . BPH (benign prostatic hyperplasia) 12/09/2014  . Chronic radicular low back pain 08/06/2012  . COPD (chronic obstructive pulmonary disease) ([place]) 04/21/2012  . Syncope 03/29/2012  . Hyponatremia 03/29/2012  . Localized osteoarthrosis not specified whether primary or secondary, lower leg 06/20/2010  . VARICOSE VEINS LOWER EXTREMITIES W/INFLAMMATION 02/12/2010  . [place] LOCALIZED OSTEOARTHROSIS INVOLVING HAND 10/16/2009  . DUPUYTREN'S CONTRACTURE, RIGHT 07/04/2009  . DEGENERATIVE DISC DISEASE, LUMBOSACRAL SPINE W/RADICULOPATHY 06/19/2009  . CARCINOMA, BASAL CELL 02/07/2009  . ANEMIA OF CHRONIC DISEASE 02/07/2009  . BURSITIS, RIGHT SHOULDER 10/07/2008  . CATARACT ASSOCIATED WITH OTHER SYNDROMES 08/01/2008  . DRY SKIN 08/01/2008  . CERUMEN IMPACTION, BILATERAL 03/01/2008  . PMR (polymyalgia rheumatica) ([place]) 01/18/2008  . CRAMP IN LIMB 01/18/2008  . DEPRESSION, SITUATIONAL, ACUTE 12/28/2007  . CONSTIPATION, DRUG INDUCED 12/28/2007  . POLYNEUROPATHY OTHER DISEASES CLASSIFIED ELSW 09/24/2007  . WEIGHT LOSS, ABNORMAL 01/07/2007  . Hyperlipidemia 09/22/2006  . Essential hypertension 09/22/2006  . GERD (gastroesophageal reflux disease) 09/22/2006  . Osteoarthritis 09/22/2006   Ruben Im, PT 09/08/18 9:29 AM Phone: [phone] Fax: [phone] Alvera Singh 09/08/2018, 9:28 AM  [facility]  [facility] [address], [place], [place], [address] Phone: [phone]   Fax:  [phone]  Name: Asif Muchow MRN: [id number] Date of Birth: 1931/04/06

## 2018-09-11 ENCOUNTER — Other Ambulatory Visit: Payer: Self-pay

## 2018-09-11 ENCOUNTER — Ambulatory Visit: Payer: Medicare Other | Admitting: Physical Therapy

## 2018-09-11 ENCOUNTER — Encounter: Payer: Self-pay | Admitting: Physical Therapy

## 2018-09-11 DIAGNOSIS — R296 Repeated falls: Secondary | ICD-10-CM | POA: Diagnosis not present

## 2018-09-11 DIAGNOSIS — G8929 Other chronic pain: Secondary | ICD-10-CM | POA: Diagnosis not present

## 2018-09-11 DIAGNOSIS — M6281 Muscle weakness (generalized): Secondary | ICD-10-CM

## 2018-09-11 DIAGNOSIS — M25561 Pain in right knee: Secondary | ICD-10-CM | POA: Diagnosis not present

## 2018-09-11 DIAGNOSIS — M25661 Stiffness of right knee, not elsewhere classified: Secondary | ICD-10-CM

## 2018-09-11 NOTE — Therapy (Signed)
[facility] [address] [place], [place], [address] Phone: [phone]   Fax:  [phone]  Physical Therapy Treatment  Patient Details  Name: Darin [name] MRN: [id number] Date of Birth: [birth year] Referring Provider (PT): Dr. Hulan Saas    Encounter Date: 09/11/2018  PT End of Session - 09/11/18 0819    Visit Number  33    Date for PT Re-Evaluation  09/17/18    Authorization Type  Medicare     PT Start Time  0800    PT Stop Time  0840    PT Time Calculation (min)  40 min    Activity Tolerance  Patient tolerated treatment well       Past Medical History:  Diagnosis Date  . Arthritis   . GERD (gastroesophageal reflux disease)   . H/O hiatal hernia    "FOR YEARS" " NO PROBLEM"  . Hyperlipidemia   . Hypertension   . Knee pain   . Neuropathy    PERIPHERAL   . Osteoporosis   . PMR (polymyalgia rheumatica) (HCC)   . Polymyalgia ([place]) 5 YEARS    Past Surgical History:  Procedure Laterality Date  . CARPAL TUNNEL RELEASE    . CATARACT EXTRACTION     right  . COLONOSCOPY  2007  . INGUINAL HERNIA REPAIR  10/14/2011   Procedure: HERNIA REPAIR INGUINAL ADULT;  Surgeon: [name], MD;  Location: [facility];  Service: General;  Laterality: Right;  Right Inguinal Hernia Repair with Mesh  . TONSILLECTOMY    . VARICOSE VEIN SURGERY      There were no vitals filed for this visit.  Subjective Assessment - 09/11/18 0805    Subjective  Pt states things are about the same today.    Diagnostic tests  No cartilage left in my knee.    Currently in Pain?  No/denies   none while on Nustep at start of session                      [facility] Adult PT Treatment/Exercise - 09/11/18 0001      Knee/Hip Exercises: Aerobic   Nustep  seat 13 arms 11 L3 10 min    Pt maintaining SPM above 100     Knee/Hip Exercises: Machines for Strengthening   Cybex Knee Extension  BLE 30# x20 reps, decreased to 20# x25  reps    pt having difficulty with this, so decreased to #20    Cybex Knee Flexion  30# + 2# ankle weight 100x   primarily compensating with hip flexion/extension   Cybex Leg Press  seat 9: #110 100x ; single leg #45 x60 reps   Pt encouraged to complete slow repetitions      Knee/Hip Exercises: Standing   Heel Raises  Both;20 reps;1 set   x25 reps             PT Education - 09/11/18 0839    Education Details  importance of completing strengthening exercises through a greater range of motion    Person(s) Educated  Patient    Methods  Explanation    Comprehension  Verbalized understanding       PT Short Term Goals - 09/01/18 0912      PT SHORT TERM GOAL #1   Title  be independent in initial HEP      Status  Achieved      PT SHORT TERM GOAL #2   Title  The patient will  have improved right knee ROM 3-120 degrees needed for greater ease getting up and down from the chair and in/out of the car    Status  Partially Met      PT SHORT TERM GOAL #3   Title  The patient will have improved hip flexor muscle length to 5 degrees and HS length to 65 degrees needed for improved stride length with ambulation    Status  Achieved      PT SHORT TERM GOAL #4   Title  Timed up and Go to 16 sec indicating improved gait safety    Status  Achieved      PT SHORT TERM GOAL #5   Title  Ten meter walk test to 14 sec indicate improved gait speed needed for community ambulation     Time  4    Period  Weeks    Status  On-going        PT Long Term Goals - 09/01/18 0913      PT LONG TERM GOAL #1   Title  be independent in advanced HEP for further improvements in strength, ROM and balance      Time  8    Period  Weeks    Status  On-going      PT LONG TERM GOAL #2   Title  Right knee ROM improved to 2-125 degrees needed for greater mobility     Time  8    Period  Weeks    Status  On-going      PT LONG TERM GOAL #3   Title  Improved right knee extension strength to 4+/5 and knee  flexion strength to 5-/5 needed for greater ease rising    Time  8    Period  Weeks    Status  On-going      PT LONG TERM GOAL #4   Title  The patient will be able to walk short distances 50 feet or less with 2 canes/poles    Time  8    Period  Weeks    Status  On-going      PT LONG TERM GOAL #5   Title  The patient will be able to ambulate 500 feet in six minutes with RW needed for shorter distance community ambulation for traveling    Time  8    Period  Weeks    Status  On-going            Plan - 09/11/18 0825    Clinical Impression Statement  Today's session focused on improving pt technique with LE strengthening exercises. Pt tends to move quickly through small ranges of knee flexion, and therapist provided verbal cues in an attempt to move through greater ranges of knee flexion/extension. Despite this, he was able to increase repetitions with single leg press today. Pt noted no increase in pain with this, but requires constant cuing to maintain his focus on attaining more knee extension particularly during leg press and knee extension. Ended session without increase in knee pain. Pt's POC is coming to an end and he would benefit from further assessment next session to determine progress towards goals and his further need for skilled PT.    Rehab Potential  Good    Clinical Impairments Affecting Rehab Potential  risk for falls    PT Frequency  2x / week    PT Duration  8 weeks    PT Treatment/Interventions  ADLs/Self Care Home Management;Cryotherapy;Electrical Stimulation;Ultrasound;Moist Heat;Iontophoresis 30m/ml Dexamethasone;Gait training;Stair training;Therapeutic  activities;Therapeutic exercise;Patient/family education;Neuromuscular re-education;Manual techniques;Taping;Dry needling    PT Next Visit Plan  KX: re-evaluation    PT Home Exercise Plan  Access Code: [email or web address]        Patient will benefit from skilled therapeutic intervention in order to improve the following  deficits and impairments:  Pain, Decreased range of motion, Decreased strength, Decreased activity tolerance, Impaired perceived functional ability, Difficulty walking, Decreased balance  Visit Diagnosis: 1. Stiffness of right knee, not elsewhere classified   2. Muscle weakness (generalized)   3. Chronic pain of right knee   4. Repeated falls        Problem List Patient Active Problem List   Diagnosis Date Noted  . Chronic left SI joint pain 04/27/2018  . Right wrist pain 02/05/2018  . Degenerative arthritis of right knee 10/23/2017  . Left leg cellulitis 08/12/2017  . Arthritis of right knee 05/20/2017  . Diverticulosis of colon with hemorrhage   . Acute blood loss anemia   . GIB (gastrointestinal bleeding) 03/12/2017  . Leg edema, right 02/20/2015  . BPH (benign prostatic hyperplasia) 12/09/2014  . Chronic radicular low back pain 08/06/2012  . COPD (chronic obstructive pulmonary disease) ([place]) 04/21/2012  . Syncope 03/29/2012  . Hyponatremia 03/29/2012  . Localized osteoarthrosis not specified whether primary or secondary, lower leg 06/20/2010  . VARICOSE VEINS LOWER EXTREMITIES W/INFLAMMATION 02/12/2010  . [place] LOCALIZED OSTEOARTHROSIS INVOLVING HAND 10/16/2009  . DUPUYTREN'S CONTRACTURE, RIGHT 07/04/2009  . DEGENERATIVE DISC DISEASE, LUMBOSACRAL SPINE W/RADICULOPATHY 06/19/2009  . CARCINOMA, BASAL CELL 02/07/2009  . ANEMIA OF CHRONIC DISEASE 02/07/2009  . BURSITIS, RIGHT SHOULDER 10/07/2008  . CATARACT ASSOCIATED WITH OTHER SYNDROMES 08/01/2008  . DRY SKIN 08/01/2008  . CERUMEN IMPACTION, BILATERAL 03/01/2008  . PMR (polymyalgia rheumatica) ([place]) 01/18/2008  . CRAMP IN LIMB 01/18/2008  . DEPRESSION, SITUATIONAL, ACUTE 12/28/2007  . CONSTIPATION, DRUG INDUCED 12/28/2007  . POLYNEUROPATHY OTHER DISEASES CLASSIFIED ELSW 09/24/2007  . WEIGHT LOSS, ABNORMAL 01/07/2007  . Hyperlipidemia 09/22/2006  . Essential hypertension 09/22/2006  . GERD (gastroesophageal reflux  disease) 09/22/2006  . Osteoarthritis 09/22/2006    8:47 AM,09/11/18 Sherol Dade PT, DPT [facility] [address] [place], [place], [address] Phone: [phone]   Fax:  [phone]  Name: Darin [name] MRN: [id number] Date of Birth: 10/14/1931

## 2018-09-14 ENCOUNTER — Other Ambulatory Visit: Payer: Self-pay | Admitting: Family Medicine

## 2018-09-15 ENCOUNTER — Encounter: Payer: Self-pay | Admitting: Physical Therapy

## 2018-09-15 ENCOUNTER — Ambulatory Visit: Payer: Medicare Other | Admitting: Physical Therapy

## 2018-09-15 ENCOUNTER — Other Ambulatory Visit: Payer: Self-pay

## 2018-09-15 DIAGNOSIS — G8929 Other chronic pain: Secondary | ICD-10-CM | POA: Diagnosis not present

## 2018-09-15 DIAGNOSIS — H04123 Dry eye syndrome of bilateral lacrimal glands: Secondary | ICD-10-CM | POA: Diagnosis not present

## 2018-09-15 DIAGNOSIS — M6281 Muscle weakness (generalized): Secondary | ICD-10-CM

## 2018-09-15 DIAGNOSIS — R296 Repeated falls: Secondary | ICD-10-CM | POA: Diagnosis not present

## 2018-09-15 DIAGNOSIS — Z961 Presence of intraocular lens: Secondary | ICD-10-CM | POA: Diagnosis not present

## 2018-09-15 DIAGNOSIS — M25661 Stiffness of right knee, not elsewhere classified: Secondary | ICD-10-CM

## 2018-09-15 DIAGNOSIS — M25561 Pain in right knee: Secondary | ICD-10-CM | POA: Diagnosis not present

## 2018-09-15 DIAGNOSIS — H18413 Arcus senilis, bilateral: Secondary | ICD-10-CM | POA: Diagnosis not present

## 2018-09-15 DIAGNOSIS — H35371 Puckering of macula, right eye: Secondary | ICD-10-CM | POA: Diagnosis not present

## 2018-09-15 DIAGNOSIS — H2512 Age-related nuclear cataract, left eye: Secondary | ICD-10-CM | POA: Diagnosis not present

## 2018-09-15 NOTE — Therapy (Signed)
[facility] [address], [place], [place], [address] Phone: [phone]   Fax:  [phone]  Physical Therapy Treatment/Recertification   Patient Details  Name: Darin [name] MRN: [id number] Date of Birth: 02-Feb-1932 Referring Provider (PT): Dr. Hulan Saas    Encounter Date: 09/15/2018  PT End of Session - 09/15/18 0907    Visit Number  34    Date for PT Re-Evaluation  10/27/18    Authorization Type  Medicare     PT Start Time  0822    PT Stop Time  0912    PT Time Calculation (min)  50 min    Activity Tolerance  Patient tolerated treatment well       Past Medical History:  Diagnosis Date  . Arthritis   . GERD (gastroesophageal reflux disease)   . H/O hiatal hernia    "FOR YEARS" " NO PROBLEM"  . Hyperlipidemia   . Hypertension   . Knee pain   . Neuropathy    PERIPHERAL   . Osteoporosis   . PMR (polymyalgia rheumatica) (HCC)   . Polymyalgia ([place]) 5 YEARS    Past Surgical History:  Procedure Laterality Date  . CARPAL TUNNEL RELEASE    . CATARACT EXTRACTION     right  . COLONOSCOPY  2007  . INGUINAL HERNIA REPAIR  10/14/2011   Procedure: HERNIA REPAIR INGUINAL ADULT;  Surgeon: [name], MD;  Location: [place];  Service: General;  Laterality: Right;  Right Inguinal Hernia Repair with Mesh  . TONSILLECTOMY    . VARICOSE VEIN SURGERY      There were no vitals filed for this visit.  Subjective Assessment - 09/15/18 0822    Subjective  I'm not sure what is going on.  When I sit or lie down I have no pain except for left hip.  There are times when I walk or turn the wrong way I pay for it in my knee.  My knee gives way 6-8x/day.   Using RW full time at home.  Walks a little in his small kitchen without it but holds on. Walks 150 feet 2x/day with pain 6-8/10    How long can you walk comfortably?  RW to mailbox 150 feet 2x/day 6-8/10 pain level    Diagnostic tests  No cartilage left in my knee.     Patient Stated Goals  I would  be able to walk with a cane but I'm not sure that's going to happen     Currently in Pain?  Yes   no pain at rest   Pain Score  6     Pain Location  Knee    Pain Orientation  Right    Pain Type  Chronic pain         [facility] PT Assessment - 09/15/18 0001      AROM   Right Knee Extension  0    Right Knee Flexion  115    Left Knee Extension  0    Left Knee Flexion  130      Strength   Right Hip Flexion  4+/5    Right Hip Extension  4-/5    Right Hip ABduction  4-/5    Left Hip Extension  4-/5    Left Hip ABduction  4-/5    Right Knee Flexion  4+/5    Right Knee Extension  4-/5    Left Knee Flexion  4+/5    Left Knee Extension  4/5  Right Ankle Dorsiflexion  4/5    Right Ankle Plantar Flexion  4/5    Left Ankle Dorsiflexion  4/5    Left Ankle Plantar Flexion  4/5      Ambulation/Gait   Ambulation/Gait Assistance  6: Modified independent (Device/Increase time)    Assistive device  Rolling walker    Pre-Gait Activities  verbal cues to hold head up;  denies falls in last few months     Gait Comments  80 feet lap 49.18 sec with RW      6 minute walk test results    Aerobic Endurance Distance Walked  425    Endurance additional comments  RW, short steps      Standardized Balance Assessment   Five times sit to stand comments   with black cushion in chair 11.29 sec     10 Meter Walk  16.17 1st time;  17.28 2nd with RW      Timed Up and Go Test   TUG Comments  17.45 RW    black cushion in chair                  [facility] Adult PT Treatment/Exercise - 09/15/18 0001      Knee/Hip Exercises: Aerobic   Nustep  seat 13 arms 11 L3 10 min    Pt maintaining SPM above 100     Knee/Hip Exercises: Machines for Strengthening   Cybex Knee Flexion  30# + 2# ankle weight 100x   primarily compensating with hip flexion/extension   Cybex Leg Press  seat 9: #110 100x ; single leg #45 x50 reps   Pt encouraged to complete slow repetitions       Knee/Hip Exercises: Seated   Other Seated Knee/Hip Exercises  review of home blue band clams and standinging at counter lateral stepping with head up     Sit to Sand  15 reps   tall table holding 5# weight emphasized slow control               PT Short Term Goals - 09/15/18 0911      PT SHORT TERM GOAL #1   Title  be independent in initial HEP      Status  Achieved      PT SHORT TERM GOAL #2   Title  The patient will have improved right knee ROM 3-120 degrees needed for greater ease getting up and down from the chair and in/out of the car    Status  Not Met      PT SHORT TERM GOAL #3   Title  The patient will have improved hip flexor muscle length to 5 degrees and HS length to 65 degrees needed for improved stride length with ambulation    Status  Achieved      PT SHORT TERM GOAL #4   Title  Timed up and Go to 16 sec indicating improved gait safety    Status  Revised      PT SHORT TERM GOAL #5   Title  Ten meter walk test to 14 sec indicate improved gait speed needed for community ambulation     Status  Revised        PT Long Term Goals - 09/15/18 0925      PT LONG TERM GOAL #1   Title  be independent in advanced HEP and return to gym program to prevent declines in strength, ROM and balance    Time  6    Period  Weeks    Status  Revised    Target Date  10/27/18      PT LONG TERM GOAL #2   Title  The patient will be able to walk to the mailbox and to the curb to get the newspaper 150 feet each way with knee and hip pain 5/10 or less    Time  6    Period  Weeks    Status  New      PT LONG TERM GOAL #3   Title  Improved bil hip abduction, extension and right knee extension strength to 4/5 needed for greater ease with sit to stand and walking/standing    Time  6    Period  Weeks    Status  Revised      PT LONG TERM GOAL #4   Title  The patient will be able to walk 10 m in < 16 sec without looking at the ground    Time  6    Period  Weeks    Status  Revised       PT LONG TERM GOAL #5   Title  The patient will be able to ambulate 500 feet in six minutes with RW needed for community ambulation    Status  Revised            Plan - 09/15/18 1214    Clinical Impression Statement  The patient has had an extended course of outpatient PT since mid December with an interruption in service for about a month secondary to the Covid 19 pandemic.  During the time of facility closure, the patient had a decline in functional mobility and strength.  Services were restarted at 2x/week for LE strengthening however his right knee pain continues to be a limiting factor especially with prolonged weight bearing (standing and walking).  He continues to have right knee give-way up to 8x a day and is using a RW.  Although his goal is to return to walking with a cane, we have discussed that this may not be a realistic expectation at this time for safety reasons with increased risk of falls.  Functional testing with 6 minute walk test, 10 m walk test and Timed up and Go are variable depending on his knee pain.  His 6 min walk test decreased to 425 feet with a rolling walker and in addition to knee pain, his aerobic capacity may be impaired from wearing a mask.  In the past, the patient was able to go to a gym but all gyms in [place] are currently closed due to the pandemic.  Progress and overall improvement  may be limited however the patient's condition demonstrates that skilled care is necessary for the performance of a safe and effective maintenance program to maintain the patient's current condition and prevent or slow further deterioration.    Personal Factors and Comorbidities  Age;Past/Current Experience;Comorbidity 2;Comorbidity 3+;Time since onset of injury/illness/exacerbation    Examination-Activity Limitations  Locomotion Level    Rehab Potential  Good    Clinical Impairments Affecting Rehab Potential  risk for falls    PT Frequency  2x / week    PT Duration  6 weeks     PT Treatment/Interventions  ADLs/Self Care Home Management;Cryotherapy;Electrical Stimulation;Ultrasound;Moist Heat;Iontophoresis 33m/ml Dexamethasone;Gait training;Stair training;Therapeutic activities;Therapeutic exercise;Patient/family education;Neuromuscular re-education;Manual techniques;Taping;Dry needling    PT Next Visit Plan  KX: walk laps every visit with RW  gait endurance;  LE strengthening    PT Home Exercise Plan  Access Code:  [email or web address]        Patient will benefit from skilled therapeutic intervention in order to improve the following deficits and impairments:  Pain, Decreased range of motion, Decreased strength, Decreased activity tolerance, Impaired perceived functional ability, Difficulty walking, Decreased balance  Visit Diagnosis: 1. Stiffness of right knee, not elsewhere classified   2. Muscle weakness (generalized)   3. Chronic pain of right knee        Problem List Patient Active Problem List   Diagnosis Date Noted  . Chronic left SI joint pain 04/27/2018  . Right wrist pain 02/05/2018  . Degenerative arthritis of right knee 10/23/2017  . Left leg cellulitis 08/12/2017  . Arthritis of right knee 05/20/2017  . Diverticulosis of colon with hemorrhage   . Acute blood loss anemia   . GIB (gastrointestinal bleeding) 03/12/2017  . Leg edema, right 02/20/2015  . BPH (benign prostatic hyperplasia) 12/09/2014  . Chronic radicular low back pain 08/06/2012  . COPD (chronic obstructive pulmonary disease) ([place]) 04/21/2012  . Syncope 03/29/2012  . Hyponatremia 03/29/2012  . Localized osteoarthrosis not specified whether primary or secondary, lower leg 06/20/2010  . VARICOSE VEINS LOWER EXTREMITIES W/INFLAMMATION 02/12/2010  . [place] LOCALIZED OSTEOARTHROSIS INVOLVING HAND 10/16/2009  . DUPUYTREN'S CONTRACTURE, RIGHT 07/04/2009  . DEGENERATIVE DISC DISEASE, LUMBOSACRAL SPINE W/RADICULOPATHY 06/19/2009  . CARCINOMA, BASAL CELL 02/07/2009  . ANEMIA OF CHRONIC DISEASE  02/07/2009  . BURSITIS, RIGHT SHOULDER 10/07/2008  . CATARACT ASSOCIATED WITH OTHER SYNDROMES 08/01/2008  . DRY SKIN 08/01/2008  . CERUMEN IMPACTION, BILATERAL 03/01/2008  . PMR (polymyalgia rheumatica) ([place]) 01/18/2008  . CRAMP IN LIMB 01/18/2008  . DEPRESSION, SITUATIONAL, ACUTE 12/28/2007  . CONSTIPATION, DRUG INDUCED 12/28/2007  . POLYNEUROPATHY OTHER DISEASES CLASSIFIED ELSW 09/24/2007  . WEIGHT LOSS, ABNORMAL 01/07/2007  . Hyperlipidemia 09/22/2006  . Essential hypertension 09/22/2006  . GERD (gastroesophageal reflux disease) 09/22/2006  . Osteoarthritis 09/22/2006   Ruben Im, PT 09/15/18 4:42 PM Phone: [phone] Fax: [phone]  Alvera Singh 09/15/2018, 4:41 PM  [facility] [address], [place], [place], [address] Phone: [phone]   Fax:  [phone]  Name: Darin [name] MRN: [id number] Date of Birth: Sep 10, 1931

## 2018-09-18 ENCOUNTER — Encounter: Payer: Self-pay | Admitting: Physical Therapy

## 2018-09-18 ENCOUNTER — Ambulatory Visit: Payer: Medicare Other | Admitting: Physical Therapy

## 2018-09-18 ENCOUNTER — Other Ambulatory Visit: Payer: Self-pay

## 2018-09-18 DIAGNOSIS — D2371 Other benign neoplasm of skin of right lower limb, including hip: Secondary | ICD-10-CM | POA: Diagnosis not present

## 2018-09-18 DIAGNOSIS — L989 Disorder of the skin and subcutaneous tissue, unspecified: Secondary | ICD-10-CM | POA: Diagnosis not present

## 2018-09-18 DIAGNOSIS — L720 Epidermal cyst: Secondary | ICD-10-CM | POA: Diagnosis not present

## 2018-09-18 DIAGNOSIS — D2262 Melanocytic nevi of left upper limb, including shoulder: Secondary | ICD-10-CM | POA: Diagnosis not present

## 2018-09-18 DIAGNOSIS — L821 Other seborrheic keratosis: Secondary | ICD-10-CM | POA: Diagnosis not present

## 2018-09-18 DIAGNOSIS — D2272 Melanocytic nevi of left lower limb, including hip: Secondary | ICD-10-CM | POA: Diagnosis not present

## 2018-09-18 DIAGNOSIS — Z85828 Personal history of other malignant neoplasm of skin: Secondary | ICD-10-CM | POA: Diagnosis not present

## 2018-09-18 DIAGNOSIS — M6281 Muscle weakness (generalized): Secondary | ICD-10-CM | POA: Diagnosis not present

## 2018-09-18 DIAGNOSIS — G8929 Other chronic pain: Secondary | ICD-10-CM

## 2018-09-18 DIAGNOSIS — L905 Scar conditions and fibrosis of skin: Secondary | ICD-10-CM | POA: Diagnosis not present

## 2018-09-18 DIAGNOSIS — M25561 Pain in right knee: Secondary | ICD-10-CM | POA: Diagnosis not present

## 2018-09-18 DIAGNOSIS — R296 Repeated falls: Secondary | ICD-10-CM | POA: Diagnosis not present

## 2018-09-18 DIAGNOSIS — D225 Melanocytic nevi of trunk: Secondary | ICD-10-CM | POA: Diagnosis not present

## 2018-09-18 DIAGNOSIS — D1801 Hemangioma of skin and subcutaneous tissue: Secondary | ICD-10-CM | POA: Diagnosis not present

## 2018-09-18 DIAGNOSIS — D485 Neoplasm of uncertain behavior of skin: Secondary | ICD-10-CM | POA: Diagnosis not present

## 2018-09-18 DIAGNOSIS — M25661 Stiffness of right knee, not elsewhere classified: Secondary | ICD-10-CM | POA: Diagnosis not present

## 2018-09-18 DIAGNOSIS — D2261 Melanocytic nevi of right upper limb, including shoulder: Secondary | ICD-10-CM | POA: Diagnosis not present

## 2018-09-18 DIAGNOSIS — L820 Inflamed seborrheic keratosis: Secondary | ICD-10-CM | POA: Diagnosis not present

## 2018-09-18 DIAGNOSIS — D2271 Melanocytic nevi of right lower limb, including hip: Secondary | ICD-10-CM | POA: Diagnosis not present

## 2018-09-18 NOTE — Therapy (Signed)
[facility] [address], [place], [place], [address] Phone: [phone]   Fax:  [phone]  Physical Therapy Treatment  Patient Details  Name: Darin [name] MRN: [id number] Date of Birth: September 11, 1931 Referring Provider (PT): Dr. Hulan Saas    Encounter Date: 09/18/2018  PT End of Session - 09/18/18 0823    Visit Number  35    Date for PT Re-Evaluation  10/27/18    Authorization Type  Medicare     PT Start Time  0802    PT Stop Time  0848    PT Time Calculation (min)  46 min    Activity Tolerance  Patient tolerated treatment well       Past Medical History:  Diagnosis Date  . Arthritis   . GERD (gastroesophageal reflux disease)   . H/O hiatal hernia    "FOR YEARS" " NO PROBLEM"  . Hyperlipidemia   . Hypertension   . Knee pain   . Neuropathy    PERIPHERAL   . Osteoporosis   . PMR (polymyalgia rheumatica) (HCC)   . Polymyalgia ([place]) 5 YEARS    Past Surgical History:  Procedure Laterality Date  . CARPAL TUNNEL RELEASE    . CATARACT EXTRACTION     right  . COLONOSCOPY  2007  . INGUINAL HERNIA REPAIR  10/14/2011   Procedure: HERNIA REPAIR INGUINAL ADULT;  Surgeon: [name], MD;  Location: [facility];  Service: General;  Laterality: Right;  Right Inguinal Hernia Repair with Mesh  . TONSILLECTOMY    . VARICOSE VEIN SURGERY      There were no vitals filed for this visit.  Subjective Assessment - 09/18/18 0803    Subjective  Pt states things are going well. He called his insurance and they said the cap has been removed temporarily. His knee is bothering him some the past couple of days.    How long can you walk comfortably?  RW to mailbox 150 feet 2x/day 6-8/10 pain level    Diagnostic tests  No cartilage left in my knee.    Patient Stated Goals  I would  be able to walk with a cane but I'm not sure that's going to happen     Currently in Pain?  No/denies                        [facility] Adult PT Treatment/Exercise - 09/18/18 0001      Ambulation/Gait   Gait Comments  80 ft lap 2x2 trials. first lap completed in 55 sec      Knee/Hip Exercises: Aerobic   Nustep  seat 13 arms 11 L3 10 min    PT present to discuss session     Knee/Hip Exercises: Machines for Strengthening   Cybex Leg Press  seat 9: #110 4x25 ; single leg #45 x50 reps   pt encouraged to complete slowly            PT Education - 09/18/18 0823    Education Details  completing HEP 2x/day to promote mobility and decrease pain    Person(s) Educated  Patient    Methods  Explanation    Comprehension  Verbalized understanding       PT Short Term Goals - 09/15/18 0911      PT SHORT TERM GOAL #1   Title  be independent in initial HEP      Status  Achieved      PT SHORT TERM  GOAL #2   Title  The patient will have improved right knee ROM 3-120 degrees needed for greater ease getting up and down from the chair and in/out of the car    Status  Not Met      PT SHORT TERM GOAL #3   Title  The patient will have improved hip flexor muscle length to 5 degrees and HS length to 65 degrees needed for improved stride length with ambulation    Status  Achieved      PT SHORT TERM GOAL #4   Title  Timed up and Go to 16 sec indicating improved gait safety    Status  Revised      PT SHORT TERM GOAL #5   Title  Ten meter walk test to 14 sec indicate improved gait speed needed for community ambulation     Status  Revised        PT Long Term Goals - 09/15/18 0925      PT LONG TERM GOAL #1   Title  be independent in advanced HEP and return to gym program to prevent declines in strength, ROM and balance    Time  6    Period  Weeks    Status  Revised    Target Date  10/27/18      PT LONG TERM GOAL #2   Title  The patient will be able to walk to the mailbox and to the curb to get the newspaper 150 feet each way with knee and hip pain 5/10 or less    Time  6     Period  Weeks    Status  New      PT LONG TERM GOAL #3   Title  Improved bil hip abduction, extension and right knee extension strength to 4/5 needed for greater ease with sit to stand and walking/standing    Time  6    Period  Weeks    Status  Revised      PT LONG TERM GOAL #4   Title  The patient will be able to walk 10 m in < 16 sec without looking at the ground    Time  6    Period  Weeks    Status  Revised      PT LONG TERM GOAL #5   Title  The patient will be able to ambulate 500 feet in six minutes with RW needed for community ambulation    Status  Revised            Plan - 09/18/18 0840    Clinical Impression Statement  Pt notes feeling good after completing his HEP in the mornings, however as the day progresses his pain and mobility declines.  Therapist encouraged pt to complete HEP 2x a day to further promote mobility. He seemed to be in agreement with this. Incorporated walking trials this session. Pt has decreased hip extension during ambulation but was able to complete 2 trials of 2 laps at a consistent speed. Pt was further educated on the benefits of slow and controlled repetitions when completing the leg extension/leg curl machine and was able to make these adjustments today. He would benefit from further encouragement with this moving forward.    Personal Factors and Comorbidities  Age;Past/Current Experience;Comorbidity 2;Comorbidity 3+;Time since onset of injury/illness/exacerbation    Examination-Activity Limitations  Locomotion Level    Rehab Potential  Good    Clinical Impairments Affecting Rehab Potential  risk for falls  PT Frequency  2x / week    PT Duration  6 weeks    PT Treatment/Interventions  ADLs/Self Care Home Management;Cryotherapy;Electrical Stimulation;Ultrasound;Moist Heat;Iontophoresis 69m/ml Dexamethasone;Gait training;Stair training;Therapeutic activities;Therapeutic exercise;Patient/family education;Neuromuscular re-education;Manual  techniques;Taping;Dry needling    PT Next Visit Plan  KX: walk laps every visit with RW  gait endurance;  LE strengthening    PT Home Exercise Plan  Access Code: [email or web address]        Patient will benefit from skilled therapeutic intervention in order to improve the following deficits and impairments:  Pain, Decreased range of motion, Decreased strength, Decreased activity tolerance, Impaired perceived functional ability, Difficulty walking, Decreased balance  Visit Diagnosis: 1. Stiffness of right knee, not elsewhere classified   2. Muscle weakness (generalized)   3. Chronic pain of right knee   4. Repeated falls        Problem List Patient Active Problem List   Diagnosis Date Noted  . Chronic left SI joint pain 04/27/2018  . Right wrist pain 02/05/2018  . Degenerative arthritis of right knee 10/23/2017  . Left leg cellulitis 08/12/2017  . Arthritis of right knee 05/20/2017  . Diverticulosis of colon with hemorrhage   . Acute blood loss anemia   . GIB (gastrointestinal bleeding) 03/12/2017  . Leg edema, right 02/20/2015  . BPH (benign prostatic hyperplasia) 12/09/2014  . Chronic radicular low back pain 08/06/2012  . COPD ([place] obstructive pulmonary disease) ([place] 04/21/2012  . Syncope 03/29/2012  . Hyponatremia 03/29/2012  . Localized osteoarthrosis not specified whether primary or secondary, lower leg 06/20/2010  . VARICOSE VEINS LOWER EXTREMITIES W/INFLAMMATION 02/12/2010  . [place] OSTE[place]VOL[place] 10/16/2009  . DUPUYTREN'S CONTRACTURE, RIGHT 07/04/2009  . DEGENERATIVE DISC DISEASE, LUMBOSACRAL SPINE W/RADICULOPATHY 06/19/2009  . CARCINOMA, BASAL CELL 02/07/2009  . ANEMIA OF CHRONIC DISEASE 02/07/2009  . BURSITIS, RIGHT SHOULDER 10/07/2008  . CATARACT ASSOCIATED WITH OTHER SYNDROMES 08/01/2008  . DRY SKIN 08/01/2008  . CERUMEN IMPACTION, BILATERAL 03/01/2008  . PMR (polymyalgia rheumatica) ([place] 01/18/2008  . CRAMP IN LIMB 01/18/2008  . DEPRESSION,  SITUATIONAL, ACUTE 12/28/2007  . CONSTIPATION, DRUG INDUCED 12/28/2007  . POLYNEUROPATHY OTHER DISEASES CLASSIFIED ELSW 09/24/2007  . WEIGHT LOSS, ABNORMAL 01/07/2007  . Hyperlipidemia 09/22/2006  . Essential hypertension 09/22/2006  . GERD (gastroesophageal reflux disease) 09/22/2006  . Osteoarthritis 09/22/2006   8:50 AM,09/18/18 SSherol DadePT, DPT CFalls Churchat [facility] [facility] [address] [place]reg[place] [address]Phone: [phone]  Fax:  [phone] Name: PSina LucchesiMRN: [id number] of Birth: [birth year]

## 2018-09-23 ENCOUNTER — Other Ambulatory Visit: Payer: Self-pay

## 2018-09-23 ENCOUNTER — Ambulatory Visit (INDEPENDENT_AMBULATORY_CARE_PROVIDER_SITE_OTHER): Payer: Medicare Other | Admitting: Podiatry

## 2018-09-23 ENCOUNTER — Encounter: Payer: Self-pay | Admitting: Podiatry

## 2018-09-23 DIAGNOSIS — B351 Tinea unguium: Secondary | ICD-10-CM | POA: Diagnosis not present

## 2018-09-23 DIAGNOSIS — M79674 Pain in right toe(s): Secondary | ICD-10-CM

## 2018-09-23 DIAGNOSIS — M79675 Pain in left toe(s): Secondary | ICD-10-CM | POA: Diagnosis not present

## 2018-09-23 NOTE — Progress Notes (Signed)
Complaint:  Visit Type: Patient returns to my office for continued preventative foot care services. Complaint: Patient states" my nails have grown long and thick and become painful to walk and wear shoes" Patient has been diagnosed with neuropathy.. The patient presents for preventative foot care services. No changes to ROS.  Patient says his feet have improved wearing his powerstep insoles.  Podiatric Exam: Vascular: dorsalis pedis and posterior tibial pulses are palpable bilateral. Capillary return is immediate. Temperature gradient is WNL. Skin turgor WNL  Sensorium: Normal Semmes Weinstein monofilament test. Normal tactile sensation bilaterally. Nail Exam: Pt has thick disfigured discolored nails with subungual debris noted bilateral entire nail hallux through fifth toenails Ulcer Exam: There is no evidence of ulcer or pre-ulcerative changes or infection. Orthopedic Exam: Muscle tone and strength are WNL. No limitations in general ROM. No crepitus or effusions noted. Foot type and digits show no abnormalities. Bony prominences are unremarkable. Skin: No Porokeratosis. No infection or ulcers  Diagnosis:  Onychomycosis, , Pain in right toe, pain in left toes.    Treatment & Plan Procedures and Treatment: Consent by patient was obtained for treatment procedures. The patient understood the discussion of treatment and procedures well. All questions were answered thoroughly reviewed. Debridement of mycotic and hypertrophic toenails, 1 through 5 bilateral and clearing of subungual debris. No ulceration, no infection noted.  Return Visit-Office Procedure: Patient instructed to return to the office for a follow up visit 3 months for continued evaluation and treatment.    Gardiner Barefoot DPM

## 2018-10-02 ENCOUNTER — Other Ambulatory Visit: Payer: Self-pay

## 2018-10-02 ENCOUNTER — Ambulatory Visit: Payer: Medicare Other | Attending: Family Medicine | Admitting: Physical Therapy

## 2018-10-02 ENCOUNTER — Encounter: Payer: Self-pay | Admitting: Physical Therapy

## 2018-10-02 DIAGNOSIS — M25661 Stiffness of right knee, not elsewhere classified: Secondary | ICD-10-CM

## 2018-10-02 DIAGNOSIS — M25561 Pain in right knee: Secondary | ICD-10-CM | POA: Insufficient documentation

## 2018-10-02 DIAGNOSIS — M6281 Muscle weakness (generalized): Secondary | ICD-10-CM

## 2018-10-02 DIAGNOSIS — G8929 Other chronic pain: Secondary | ICD-10-CM

## 2018-10-02 DIAGNOSIS — R296 Repeated falls: Secondary | ICD-10-CM | POA: Diagnosis not present

## 2018-10-02 NOTE — Therapy (Signed)
[facility] [address], [place], [place], [address] Phone: [phone]   Fax:  [phone]  Physical Therapy Treatment  Patient Details  Name: Darin [name] MRN: [id number] Date of Birth: [birth year] Referring Provider (PT): Dr. Hulan Saas    Encounter Date: 10/02/2018  PT End of Session - 10/02/18 0822    Visit Number  47    Date for PT Re-Evaluation  10/27/18    Authorization Type  Medicare     PT Start Time  0810   Pt arrived late and unsure how much he will be able to do   PT Stop Time  0843   Pt declined extending session due to hip pain   PT Time Calculation (min)  33 min    Activity Tolerance  Patient tolerated treatment well;Patient limited by pain    Behavior During Therapy  [facility] for tasks assessed/performed       Past Medical History:  Diagnosis Date  . Arthritis   . GERD (gastroesophageal reflux disease)   . H/O hiatal hernia    "FOR YEARS" " NO PROBLEM"  . Hyperlipidemia   . Hypertension   . Knee pain   . Neuropathy    PERIPHERAL   . Osteoporosis   . PMR (polymyalgia rheumatica) (HCC)   . Polymyalgia ([place]) 5 YEARS    Past Surgical History:  Procedure Laterality Date  . CARPAL TUNNEL RELEASE    . CATARACT EXTRACTION     right  . COLONOSCOPY  2007  . INGUINAL HERNIA REPAIR  10/14/2011   Procedure: HERNIA REPAIR INGUINAL ADULT;  Surgeon: [name], MD;  Location: [facility];  Service: General;  Laterality: Right;  Right Inguinal Hernia Repair with Mesh  . TONSILLECTOMY    . VARICOSE VEIN SURGERY      There were no vitals filed for this visit.  Subjective Assessment - 10/02/18 0812    Subjective  Pt states that things are not going well. His Lt hip has been bothering him more than usual. It started last week and is interrupting his sleep. He is not sure how much he will be able to get through today.    How long can you walk comfortably?  RW to mailbox 150 feet 2x/day 6-8/10 pain  level    Diagnostic tests  No cartilage left in my knee.    Patient Stated Goals  I would  be able to walk with a cane but I'm not sure that's going to happen     Currently in Pain?  Yes    Pain Score  7     Pain Location  Hip    Pain Orientation  Left;Posterior;Lateral    Pain Descriptors / Indicators  Aching    Pain Type  Chronic pain    Pain Radiating Towards  down to the calf    Pain Onset  More than a month ago    Pain Frequency  Constant    Aggravating Factors   not sure    Pain Relieving Factors  not sure    Effect of Pain on Daily Activities  limited activity participation, limited sleep                       [facility] Adult PT Treatment/Exercise - 10/02/18 0001      Knee/Hip Exercises: Aerobic   Nustep  seat 13 arms 11 L3 10 min    PT present to discuss recent change in symptoms  Knee/Hip Exercises: Standing   Heel Raises  Both;20 reps;1 set   x30 reps      Knee/Hip Exercises: Supine   Other Supine Knee/Hip Exercises  Lt sciatic nerve glide with therapist tactile/verbal cuing x20 reps    Other Supine Knee/Hip Exercises  hookling low trunk rotation to Rt only x20             PT Education - 10/02/18 0852    Education Details  encouraged pt to continue with HEP unless increase in LLE symptoms is noted    Person(s) Educated  Patient    Methods  Explanation    Comprehension  Verbalized understanding       PT Short Term Goals - 09/15/18 0911      PT SHORT TERM GOAL #1   Title  be independent in initial HEP      Status  Achieved      PT SHORT TERM GOAL #2   Title  The patient will have improved right knee ROM 3-120 degrees needed for greater ease getting up and down from the chair and in/out of the car    Status  Not Met      PT SHORT TERM GOAL #3   Title  The patient will have improved hip flexor muscle length to 5 degrees and HS length to 65 degrees needed for improved stride length with ambulation    Status  Achieved      PT SHORT TERM  GOAL #4   Title  Timed up and Go to 16 sec indicating improved gait safety    Status  Revised      PT SHORT TERM GOAL #5   Title  Ten meter walk test to 14 sec indicate improved gait speed needed for community ambulation     Status  Revised        PT Long Term Goals - 09/15/18 0925      PT LONG TERM GOAL #1   Title  be independent in advanced HEP and return to gym program to prevent declines in strength, ROM and balance    Time  6    Period  Weeks    Status  Revised    Target Date  10/27/18      PT LONG TERM GOAL #2   Title  The patient will be able to walk to the mailbox and to the curb to get the newspaper 150 feet each way with knee and hip pain 5/10 or less    Time  6    Period  Weeks    Status  New      PT LONG TERM GOAL #3   Title  Improved bil hip abduction, extension and right knee extension strength to 4/5 needed for greater ease with sit to stand and walking/standing    Time  6    Period  Weeks    Status  Revised      PT LONG TERM GOAL #4   Title  The patient will be able to walk 10 m in < 16 sec without looking at the ground    Time  6    Period  Weeks    Status  Revised      PT LONG TERM GOAL #5   Title  The patient will be able to ambulate 500 feet in six minutes with RW needed for community ambulation    Status  Revised            Plan -  10/02/18 0847    Clinical Impression Statement  Pt arrived with reported increases in Lt LE radicular symptoms that had grown increasingly worse over the past week. He requested to avoid the machines this visit due to this. Therapist monitored pt's LE symptoms and pt was instructed in gentle LE exercises. Pt reported centralized pain with low trunk rotation to the Rt but was hesitant to do much more during today's session. He will be following up with his referring physician on Monday and we will progress exercises as able.    Personal Factors and Comorbidities  Age;Past/Current Experience;Comorbidity 2;Comorbidity  3+;Time since onset of injury/illness/exacerbation    Examination-Activity Limitations  Locomotion Level    Rehab Potential  Good    Clinical Impairments Affecting Rehab Potential  risk for falls    PT Frequency  2x / week    PT Duration  6 weeks    PT Treatment/Interventions  ADLs/Self Care Home Management;Cryotherapy;Electrical Stimulation;Ultrasound;Moist Heat;Iontophoresis '4mg'$ /ml Dexamethasone;Gait training;Stair training;Therapeutic activities;Therapeutic exercise;Patient/family education;Neuromuscular re-education;Manual techniques;Taping;Dry needling    PT Next Visit Plan  f/u on MD visit regarding hip pain; KX: walk laps every visit with RW  gait endurance;  LE strengthening    PT Home Exercise Plan  Access Code: [email or web address]        Patient will benefit from skilled therapeutic intervention in order to improve the following deficits and impairments:  Pain, Decreased range of motion, Decreased strength, Decreased activity tolerance, Impaired perceived functional ability, Difficulty walking, Decreased balance  Visit Diagnosis: 1. Stiffness of right knee, not elsewhere classified   2. Muscle weakness (generalized)   3. Chronic pain of right knee   4. Repeated falls        Problem List Patient Active Problem List   Diagnosis Date Noted  . Pain due to onychomycosis of toenails of both feet 09/23/2018  . Chronic left SI joint pain 04/27/2018  . Right wrist pain 02/05/2018  . Degenerative arthritis of right knee 10/23/2017  . Left leg cellulitis 08/12/2017  . Arthritis of right knee 05/20/2017  . Diverticulosis of colon with hemorrhage   . Acute blood loss anemia   . GIB (gastrointestinal bleeding) 03/12/2017  . Leg edema, right 02/20/2015  . BPH (benign prostatic hyperplasia) 12/09/2014  . Chronic radicular low back pain 08/06/2012  . COPD (chronic obstructive pulmonary disease) ([place]) 04/21/2012  . Syncope 03/29/2012  . Hyponatremia 03/29/2012  . Localized osteoarthrosis not  specified whether primary or secondary, lower leg 06/20/2010  . VARICOSE VEINS LOWER EXTREMITIES W/INFLAMMATION 02/12/2010  . [place] LOCALIZED OSTEOARTHROSIS INVOLVING HAND 10/16/2009  . DUPUYTREN'S CONTRACTURE, RIGHT 07/04/2009  . DEGENERATIVE DISC DISEASE, LUMBOSACRAL SPINE W/RADICULOPATHY 06/19/2009  . CARCINOMA, BASAL CELL 02/07/2009  . ANEMIA OF CHRONIC DISEASE 02/07/2009  . BURSITIS, RIGHT SHOULDER 10/07/2008  . CATARACT ASSOCIATED WITH OTHER SYNDROMES 08/01/2008  . DRY SKIN 08/01/2008  . CERUMEN IMPACTION, BILATERAL 03/01/2008  . PMR (polymyalgia rheumatica) ([place]) 01/18/2008  . CRAMP IN LIMB 01/18/2008  . DEPRESSION, SITUATIONAL, ACUTE 12/28/2007  . CONSTIPATION, DRUG INDUCED 12/28/2007  . POLYNEUROPATHY OTHER DISEASES CLASSIFIED ELSW 09/24/2007  . WEIGHT LOSS, ABNORMAL 01/07/2007  . Hyperlipidemia 09/22/2006  . Essential hypertension 09/22/2006  . GERD (gastroesophageal reflux disease) 09/22/2006  . Osteoarthritis 09/22/2006   8:53 AM,10/02/18 Sherol Dade PT, DPT [facility] [address] [place], [place], [address] Phone: [phone]   Fax:  [phone]  Name: Darin [name] MRN: [id number] Date of Birth:  04/09/1931   

## 2018-10-04 NOTE — Progress Notes (Signed)
[name] [place], [place] [address] Phone: [phone] Subjective:   I Darin [name] am serving as a Education administrator for Dr. Hulan Saas.   CC: Left hip pain  BWI:[id number]  Darin [name] is a 85 y.o. male coming in with complaint of left hip pain. Sometimes the pain radiates down the leg.  Patient says it seems to be more in the buttocks area.  Patient has had back pain previously but states that this is more localized.  Does have the radiation down the leg.  Denies any weakness. Severity Difficult to do a lot of activity secondary to the pain though.       Past Medical History:  Diagnosis Date  . Arthritis   . GERD (gastroesophageal reflux disease)   . H/O hiatal hernia    "FOR YEARS" " NO PROBLEM"  . Hyperlipidemia   . Hypertension   . Knee pain   . Neuropathy    PERIPHERAL   . Osteoporosis   . PMR (polymyalgia rheumatica) (HCC)   . Polymyalgia ([place]) 5 YEARS   Past Surgical History:  Procedure Laterality Date  . CARPAL TUNNEL RELEASE    . CATARACT EXTRACTION     right  . COLONOSCOPY  2007  . INGUINAL HERNIA REPAIR  10/14/2011   Procedure: HERNIA REPAIR INGUINAL ADULT;  Surgeon: [name], MD;  Location: [facility];  Service: General;  Laterality: Right;  Right Inguinal Hernia Repair with Mesh  . TONSILLECTOMY    . VARICOSE VEIN SURGERY     Social History   Socioeconomic History  . Marital status: Widowed    Spouse name: Not on file  . Number of children: Not on file  . Years of education: Not on file  . Highest education level: Not on file  Occupational History  . Occupation: retired    Fish farm manager: RETIRED  Social Needs  . Financial resource strain: Not on file  . Food insecurity    Worry: Not on file    Inability: Not on file  . Transportation needs    Medical: Not on file    Non-medical: Not on file  Tobacco Use  . Smoking status: Never Smoker  . Smokeless tobacco: Never Used  Substance and Sexual  Activity  . Alcohol use: Yes    Alcohol/week: 3.0 standard drinks    Types: 3 Glasses of wine per week    Comment: half glass wine before dinner  . Drug use: No  . Sexual activity: Yes  Lifestyle  . Physical activity    Days per week: Not on file    Minutes per session: Not on file  . Stress: Not on file  Relationships  . Social Herbalist on phone: Not on file    Gets together: Not on file    Attends religious service: Not on file    Active member of club or organization: Not on file    Attends meetings of clubs or organizations: Not on file    Relationship status: Not on file  Other Topics Concern  . Not on file  Social History Narrative  . Not on file   Allergies  Allergen Reactions  . Statins Other (See Comments)    Muscle pain and upset stomach   Family History  Problem Relation Age of Onset  . Heart disease Father   . Coronary artery disease Other     Current Outpatient Medications (Endocrine & Metabolic):  .  predniSONE (  DELTASONE) 50 MG tablet, Take 1 tablet (50 mg total) by mouth daily.  Current Outpatient Medications (Cardiovascular):  .  amLODipine (NORVASC) 5 MG tablet, TAKE 1 TABLET DAILY .  benazepril-hydrochlorthiazide (LOTENSIN HCT) 10-12.5 MG tablet, TAKE 1 TABLET DAILY        *PATIENT NEEDS AN          APPOINTMENT*   Current Outpatient Medications (Analgesics):  .  aspirin 81 MG tablet, Take 81 mg by mouth daily.    Current Outpatient Medications (Other):  .  famotidine (PEPCID) 20 MG tablet, TAKE 1 TABLET AT BEDTIME .  finasteride (PROSCAR) 5 MG tablet, TAKE 1 TABLET EACH DAY. Marland Kitchen  gabapentin (NEURONTIN) 100 MG capsule, TAKE 1 CAPSULE 4 TIMES     DAILY .  niacin 500 MG tablet, Take 500 mg by mouth 2 (two) times daily with a meal.  .  polyethylene glycol (MIRALAX / GLYCOLAX) packet, Take 17 g by mouth daily as needed for moderate constipation. .  triamcinolone ointment (KENALOG) 0.1 %, Apply 1 application topically 2 (two) times daily.  To affected area .  vitamin C (ASCORBIC ACID) 500 MG tablet, Take 500 mg by mouth daily.    Past medical history, social, surgical and family history all reviewed in electronic medical record.  No pertanent information unless stated regarding to the chief complaint.   Review of Systems:  No headache, visual changes, nausea, vomiting, diarrhea, constipation, dizziness, abdominal pain, skin rash, fevers, chills, night sweats, weight loss, swollen lymph nodes, body aches, joint swelling,  chest pain, shortness of breath, mood changes.  Positive muscle aches  Objective  Blood pressure (!) 120/50, pulse 86, height 6\' 1"  (1.854 m), weight 198 lb (89.8 kg), SpO2 97 %.    General: No apparent distress alert and oriented x3 mood and affect normal, dressed appropriately.  HEENT: Pupils equal, extraocular movements intact  Respiratory: Patient's speak in full sentences and does not appear short of breath  Cardiovascular: No lower extremity edema, non tender, no erythema  Skin: Warm dry intact with no signs of infection or rash on extremities or on axial skeleton.  Abdomen: Soft nontender  Neuro: Cranial nerves II through XII are intact, neurovascularly intact in all extremities with 2+ DTRs and 2+ pulses.  Lymph: No lymphadenopathy of posterior or anterior cervical chain or axillae bilaterally.  Gait antalgic MSK:  Non tender with full range of motion and good stability and symmetric strength and tone of shoulders, elbows, wrist, and ankles bilaterally.  Knee: Right valgus deformity noted. Large thigh to calf ratio.  Nontender on exam ROM full in flexion and extension and lower leg rotation. instability with valgus force.  painful patellar compression. Patellar glide with moderate crepitus. Patellar and quadriceps tendons unremarkable. Hamstring and quadriceps strength is normal. Contralateral knee shows arthritic changes as well.   Hip: Left ROM Decreased range of motion in all planes for  severe tenderness over the piriformis more laterally though. Patient does have somewhat limited range of motion as we discussed previously. Pelvic alignment unremarkable to inspection and palpation. Standing hip rotation and gait without trendelenburg sign / unsteadiness. Greater trochanter very tender Moderate to severe tenderness over the left piriformis Pain over the left sacroiliac joint also in the paraspinal musculature lumbar spine.  Negative straight leg test though of the with any radicular symptoms.  Procedure: Real-time Ultrasound Guided Injection of left piriformis tendon sheath Device: GE Logiq Q7 Ultrasound guided injection is preferred based studies that show increased duration, increased effect, greater accuracy,  decreased procedural pain, increased response rate, and decreased cost with ultrasound guided versus blind injection.  Verbal informed consent obtained.  Time-out conducted.  Noted no overlying erythema, induration, or other signs of local infection.  Skin prepped in a sterile fashion.  Local anesthesia: Topical Ethyl chloride.  With sterile technique and under real time ultrasound guidance: With a 21-gauge 2 inch needle patient was 2 cc of 0.5% Marcaine and 1 cc of Kenalog 40 mg/mL Completed without difficulty  Pain immediately resolved suggesting accurate placement of the medication.  Advised to call if fevers/chills, erythema, induration, drainage, or persistent bleeding.  Images permanently stored and available for review in the ultrasound unit.  Impression: Technically successful ultrasound guided injection.    Impression and Recommendations:     This case required medical decision making of moderate complexity. The above documentation has been reviewed and is accurate and complete Lyndal Pulley, DO       Note: This dictation was prepared with Dragon dictation along with smaller phrase technology. Any transcriptional errors that result from this  process are unintentional.

## 2018-10-05 ENCOUNTER — Encounter: Payer: Self-pay | Admitting: Family Medicine

## 2018-10-05 ENCOUNTER — Ambulatory Visit (INDEPENDENT_AMBULATORY_CARE_PROVIDER_SITE_OTHER): Payer: Medicare Other | Admitting: Family Medicine

## 2018-10-05 ENCOUNTER — Ambulatory Visit: Payer: Self-pay

## 2018-10-05 ENCOUNTER — Other Ambulatory Visit: Payer: Self-pay

## 2018-10-05 VITALS — BP 120/50 | HR 86 | Ht 73.0 in | Wt 198.0 lb

## 2018-10-05 DIAGNOSIS — M25552 Pain in left hip: Secondary | ICD-10-CM

## 2018-10-05 DIAGNOSIS — S76312A Strain of muscle, fascia and tendon of the posterior muscle group at thigh level, left thigh, initial encounter: Secondary | ICD-10-CM

## 2018-10-05 NOTE — Patient Instructions (Signed)
Call us in a week. If not better lets do epidural. Schedule 2-3 weeks after epidural if that is what you decide.

## 2018-10-05 NOTE — Assessment & Plan Note (Signed)
Patient given injection.  Tolerated the procedure well.  I do believe that patient likely has more of a lumbar radiculopathy.  Encourage patient consider the gabapentin on a more regular basis.  Discussed icing regimen and home exercises.  Discussed which activities to do which will still avoid.  If no significant improvement need to consider the possibility of an epidural again.  Patient is in agreement with the plan.  Spent  25 minutes with patient face-to-face and had greater than 50% of counseling including as described above in assessment and plan.

## 2018-10-06 ENCOUNTER — Encounter: Payer: Self-pay | Admitting: Physical Therapy

## 2018-10-06 ENCOUNTER — Ambulatory Visit: Payer: Medicare Other | Admitting: Physical Therapy

## 2018-10-06 DIAGNOSIS — M6281 Muscle weakness (generalized): Secondary | ICD-10-CM | POA: Diagnosis not present

## 2018-10-06 DIAGNOSIS — M25661 Stiffness of right knee, not elsewhere classified: Secondary | ICD-10-CM

## 2018-10-06 DIAGNOSIS — G8929 Other chronic pain: Secondary | ICD-10-CM | POA: Diagnosis not present

## 2018-10-06 DIAGNOSIS — M25561 Pain in right knee: Secondary | ICD-10-CM | POA: Diagnosis not present

## 2018-10-06 DIAGNOSIS — Z85828 Personal history of other malignant neoplasm of skin: Secondary | ICD-10-CM | POA: Diagnosis not present

## 2018-10-06 DIAGNOSIS — R296 Repeated falls: Secondary | ICD-10-CM | POA: Diagnosis not present

## 2018-10-06 DIAGNOSIS — D485 Neoplasm of uncertain behavior of skin: Secondary | ICD-10-CM | POA: Diagnosis not present

## 2018-10-06 DIAGNOSIS — L988 Other specified disorders of the skin and subcutaneous tissue: Secondary | ICD-10-CM | POA: Diagnosis not present

## 2018-10-06 NOTE — Therapy (Signed)
[facility] [address] [place], [place], [address] Phone: [phone]   Fax:  [phone]  Physical Therapy Treatment  Patient Details  Name: Darin [name] MRN: [id number] Date of Birth: [birth year] Referring Provider (PT): Dr. Hulan Saas    Encounter Date: 10/06/2018  PT End of Session - 10/06/18 0837    Visit Number  1    Date for PT Re-Evaluation  10/27/18    Authorization Type  Medicare     PT Start Time  0827    PT Stop Time  0905    PT Time Calculation (min)  38 min    Activity Tolerance  Patient tolerated treatment well       Past Medical History:  Diagnosis Date  . Arthritis   . GERD (gastroesophageal reflux disease)   . H/O hiatal hernia    "FOR YEARS" " NO PROBLEM"  . Hyperlipidemia   . Hypertension   . Knee pain   . Neuropathy    PERIPHERAL   . Osteoporosis   . PMR (polymyalgia rheumatica) (HCC)   . Polymyalgia ([place]) 5 YEARS    Past Surgical History:  Procedure Laterality Date  . CARPAL TUNNEL RELEASE    . CATARACT EXTRACTION     right  . COLONOSCOPY  2007  . INGUINAL HERNIA REPAIR  10/14/2011   Procedure: HERNIA REPAIR INGUINAL ADULT;  Surgeon: [name], MD;  Location: [place];  Service: General;  Laterality: Right;  Right Inguinal Hernia Repair with Mesh  . TONSILLECTOMY    . VARICOSE VEIN SURGERY      There were no vitals filed for this visit.  Subjective Assessment - 10/06/18 0829    Subjective  I had an injection in my hip yesterday and he told me to take it easy today.  The full effects will take a week.  If that doesn't work then he recommends a epidural.  I felt significantly better after I left PT last time.    Patient Stated Goals  I would  be able to walk with a cane but I'm not sure that's going to happen     Currently in Pain?  Yes    Pain Score  7     Pain Location  Hip    Pain Orientation  Left    Pain Type  Chronic pain    Pain Radiating Towards  right  knee 2/10                       [facility] Adult PT Treatment/Exercise - 10/06/18 0001      Knee/Hip Exercises: Aerobic   Nustep  seat 13 arms 11 L3 10 min    PT present to discuss recent change in symptoms      Knee/Hip Exercises: Standing   Heel Raises  20 reps    Hip Abduction  AROM;Right;Left;10 reps    Abduction Limitations  alternating side step taps    Other Standing Knee Exercises  alternating step taps 10x     Other Standing Knee Exercises  retro stepping right/left 15x each       Knee/Hip Exercises: Seated   Ball Squeeze  20x    Clamshell with TheraBand  Blue   30x     Knee/Hip Exercises: Supine   Heel Slides Limitations  HS sets on green ball 10x     Bridges  Both;10 reps    Bridges Limitations  green ball     Heel  Prop for Knee Extension Limitations  knees apart eith     Other Supine Knee/Hip Exercises  green ball rolls for hip/knee flexion 10x    Other Supine Knee/Hip Exercises  hookling with green ball  low trunk rotation to Rt only x20               PT Short Term Goals - 09/15/18 0911      PT SHORT TERM GOAL #1   Title  be independent in initial HEP      Status  Achieved      PT SHORT TERM GOAL #2   Title  The patient will have improved right knee ROM 3-120 degrees needed for greater ease getting up and down from the chair and in/out of the car    Status  Not Met      PT SHORT TERM GOAL #3   Title  The patient will have improved hip flexor muscle length to 5 degrees and HS length to 65 degrees needed for improved stride length with ambulation    Status  Achieved      PT SHORT TERM GOAL #4   Title  Timed up and Go to 16 sec indicating improved gait safety    Status  Revised      PT SHORT TERM GOAL #5   Title  Ten meter walk test to 14 sec indicate improved gait speed needed for community ambulation     Status  Revised        PT Long Term Goals - 09/15/18 0925      PT LONG TERM GOAL #1   Title  be independent in advanced HEP  and return to gym program to prevent declines in strength, ROM and balance    Time  6    Period  Weeks    Status  Revised    Target Date  10/27/18      PT LONG TERM GOAL #2   Title  The patient will be able to walk to the mailbox and to the curb to get the newspaper 150 feet each way with knee and hip pain 5/10 or less    Time  6    Period  Weeks    Status  New      PT LONG TERM GOAL #3   Title  Improved bil hip abduction, extension and right knee extension strength to 4/5 needed for greater ease with sit to stand and walking/standing    Time  6    Period  Weeks    Status  Revised      PT LONG TERM GOAL #4   Title  The patient will be able to walk 10 m in < 16 sec without looking at the ground    Time  6    Period  Weeks    Status  Revised      PT LONG TERM GOAL #5   Title  The patient will be able to ambulate 500 feet in six minutes with RW needed for community ambulation    Status  Revised            Plan - 10/06/18 0904    Clinical Impression Statement  The patient reports continued hip pain 1 day s/p injection.  Treatment modified to reduce exercise intensity accordingly.  Therapist closely monitoring response with all treatment interventions.  Patient was able to perform active ROM and low intensity strengthening without an increase in knee pain but reports a minor discomfort  in hip pain.    Clinical Impairments Affecting Rehab Potential  risk for falls    PT Frequency  2x / week    PT Duration  6 weeks    PT Treatment/Interventions  ADLs/Self Care Home Management;Cryotherapy;Electrical Stimulation;Ultrasound;Moist Heat;Iontophoresis 36m/ml Dexamethasone;Gait training;Stair training;Therapeutic activities;Therapeutic exercise;Patient/family education;Neuromuscular re-education;Manual techniques;Taping;Dry needling    PT Next Visit Plan  KX: walk laps every visit with RW  gait endurance; resume weighted  LE strengthening as able    PT Home Exercise Plan  Access Code:  [email or web address]        Patient will benefit from skilled therapeutic intervention in order to improve the following deficits and impairments:  Pain, Decreased range of motion, Decreased strength, Decreased activity tolerance, Impaired perceived functional ability, Difficulty walking, Decreased balance  Visit Diagnosis: 1. Stiffness of right knee, not elsewhere classified   2. Muscle weakness (generalized)   3. Chronic pain of right knee        Problem List Patient Active Problem List   Diagnosis Date Noted  . Strain of left piriformis muscle 10/05/2018  . Pain due to onychomycosis of toenails of both feet 09/23/2018  . Chronic left SI joint pain 04/27/2018  . Right wrist pain 02/05/2018  . Degenerative arthritis of right knee 10/23/2017  . Left leg cellulitis 08/12/2017  . Arthritis of right knee 05/20/2017  . Diverticulosis of colon with hemorrhage   . Acute blood loss anemia   . GIB (gastrointestinal bleeding) 03/12/2017  . Leg edema, right 02/20/2015  . BPH (benign prostatic hyperplasia) 12/09/2014  . Chronic radicular low back pain 08/06/2012  . COPD (chronic obstructive pulmonary disease) ([place] 04/21/2012  . Syncope 03/29/2012  . Hyponatremia 03/29/2012  . Localized osteoarthrosis not specified whether primary or secondary, lower leg 06/20/2010  . VARICOSE VEINS LOWER EXTREMITIES W/INFLAMMATION 02/12/2010  . [place] 10/16/2009  . DUPUYTREN'S CONTRACTURE, RIGHT 07/04/2009  . DEGENERATIVE DISC DISEASE, LUMBOSACRAL SPINE W/RADICULOPATHY 06/19/2009  . CARCINOMA, BASAL CELL 02/07/2009  . ANEMIA OF CHRONIC DISEASE 02/07/2009  . BURSITIS, RIGHT SHOULDER 10/07/2008  . CATARACT ASSOCIATED WITH OTHER SYNDROMES 08/01/2008  . DRY SKIN 08/01/2008  . CERUMEN IMPACTION, BILATERAL 03/01/2008  . PM[place] ([place]algia [place]umatica) [place] 01/18/2008  . CRAMP IN LIMB 01/18/2008  . DEPRESSION, SITUATIONAL, ACUTE 12/28/2007  . CONSTIPATION, DRUG INDUCED 12/28/2007   . POLYNEUROPATHY OTHER DISEASES CLASSIFIED ELSW 09/24/2007  . WEIGHT LOSS, ABNORMAL 01/07/2007  . Hyperlipidemia 09/22/2006  . Essential hypertension 09/22/2006  . GERD (gastroesophageal reflux disease) 09/22/2006  . Osteoarthritis 09/22/2006   SRuben [name] PT 10/06/18 9:13 AM Phone: [phone]: [phone] Singh7/14/2020, 9:13 AM  [facility] [address] [place] [address]Phone: [phone]  Fax:  [phone] Name: PNikko GoldwireMRN: [id number] of Birth: [birth year]

## 2018-10-08 ENCOUNTER — Encounter: Payer: Self-pay | Admitting: Physical Therapy

## 2018-10-08 ENCOUNTER — Other Ambulatory Visit: Payer: Self-pay

## 2018-10-08 ENCOUNTER — Ambulatory Visit: Payer: Medicare Other | Admitting: Physical Therapy

## 2018-10-08 DIAGNOSIS — M6281 Muscle weakness (generalized): Secondary | ICD-10-CM

## 2018-10-08 DIAGNOSIS — R296 Repeated falls: Secondary | ICD-10-CM | POA: Diagnosis not present

## 2018-10-08 DIAGNOSIS — M25561 Pain in right knee: Secondary | ICD-10-CM | POA: Diagnosis not present

## 2018-10-08 DIAGNOSIS — G8929 Other chronic pain: Secondary | ICD-10-CM

## 2018-10-08 DIAGNOSIS — M25661 Stiffness of right knee, not elsewhere classified: Secondary | ICD-10-CM | POA: Diagnosis not present

## 2018-10-08 NOTE — Therapy (Signed)
[facility] [address], [place], [place], [address] Phone: [phone]   Fax:  [phone]  Physical Therapy Treatment  Patient Details  Name: Darin [name] MRN: [id number] Date of Birth: 07-09-1931 Referring Provider (PT): Dr. Hulan Saas    Encounter Date: 10/08/2018  PT End of Session - 10/08/18 0849    Visit Number  37    Date for PT Re-Evaluation  10/27/18    Authorization Type  Medicare     PT Start Time  0826    PT Stop Time  0915    PT Time Calculation (min)  49 min    Activity Tolerance  Patient tolerated treatment well       Past Medical History:  Diagnosis Date  . Arthritis   . GERD (gastroesophageal reflux disease)   . H/O hiatal hernia    "FOR YEARS" " NO PROBLEM"  . Hyperlipidemia   . Hypertension   . Knee pain   . Neuropathy    PERIPHERAL   . Osteoporosis   . PMR (polymyalgia rheumatica) (HCC)   . Polymyalgia ([place]) 5 YEARS    Past Surgical History:  Procedure Laterality Date  . CARPAL TUNNEL RELEASE    . CATARACT EXTRACTION     right  . COLONOSCOPY  2007  . INGUINAL HERNIA REPAIR  10/14/2011   Procedure: HERNIA REPAIR INGUINAL ADULT;  Surgeon: [name], MD;  Location: [place];  Service: General;  Laterality: Right;  Right Inguinal Hernia Repair with Mesh  . TONSILLECTOMY    . VARICOSE VEIN SURGERY      There were no vitals filed for this visit.  Subjective Assessment - 10/08/18 0826    Subjective  I had a cancer spot cut off of the back of my right knee the other day so it's sore.  My hip is sore.    Currently in Pain?  Yes    Pain Score  6     Pain Location  Hip    Pain Orientation  Left    Aggravating Factors   nothing specific    Pain Relieving Factors  nothing    Pain Score  6    Pain Location  Knee    Pain Orientation  Right                       [facility] Adult PT Treatment/Exercise - 10/08/18 0001      Ambulation/Gait   Gait Comments  160  feet with RW    perceived exertion from 1-20 "5-6" level      Knee/Hip Exercises: Stretches   Active Hamstring Stretch  Right;Left;3 reps;30 seconds    Active Hamstring Stretch Limitations  with strap supine     Passive Hamstring Stretch Limitations  seated 10x right/left       Knee/Hip Exercises: Aerobic   Nustep  seat 13 arms 11 L3 10 min    PT present to discuss recent change in symptoms      Knee/Hip Exercises: Standing   Gait Training  4 way weight shifting 15x     Other Standing Knee Exercises  alternating step taps 10x     Other Standing Knee Exercises  retro stepping right/left 15x each       Knee/Hip Exercises: Supine   Heel Slides Limitations  HS sets on green ball 10x     Other Supine Knee/Hip Exercises  green ball rolls for hip/knee flexion 10x    Other Supine  Knee/Hip Exercises  hookling with green ball  low trunk rotation to Rt only x20      Knee/Hip Exercises: Sidelying   Hip ABduction  AROM;Right;Left;10 reps    Clams  10x right/left    Other Sidelying Knee/Hip Exercises  assisted hip extension 10x right/left                PT Short Term Goals - 09/15/18 0911      PT SHORT TERM GOAL #1   Title  be independent in initial HEP      Status  Achieved      PT SHORT TERM GOAL #2   Title  The patient will have improved right knee ROM 3-120 degrees needed for greater ease getting up and down from the chair and in/out of the car    Status  Not Met      PT SHORT TERM GOAL #3   Title  The patient will have improved hip flexor muscle length to 5 degrees and HS length to 65 degrees needed for improved stride length with ambulation    Status  Achieved      PT SHORT TERM GOAL #4   Title  Timed up and Go to 16 sec indicating improved gait safety    Status  Revised      PT SHORT TERM GOAL #5   Title  Ten meter walk test to 14 sec indicate improved gait speed needed for community ambulation     Status  Revised        PT Long Term Goals - 09/15/18 0925       PT LONG TERM GOAL #1   Title  be independent in advanced HEP and return to gym program to prevent declines in strength, ROM and balance    Time  6    Period  Weeks    Status  Revised    Target Date  10/27/18      PT LONG TERM GOAL #2   Title  The patient will be able to walk to the mailbox and to the curb to get the newspaper 150 feet each way with knee and hip pain 5/10 or less    Time  6    Period  Weeks    Status  New      PT LONG TERM GOAL #3   Title  Improved bil hip abduction, extension and right knee extension strength to 4/5 needed for greater ease with sit to stand and walking/standing    Time  6    Period  Weeks    Status  Revised      PT LONG TERM GOAL #4   Title  The patient will be able to walk 10 m in < 16 sec without looking at the ground    Time  6    Period  Weeks    Status  Revised      PT LONG TERM GOAL #5   Title  The patient will be able to ambulate 500 feet in six minutes with RW needed for community ambulation    Status  Revised            Plan - 10/08/18 0851    Clinical Impression Statement  The patient reports continued soreness in hip and right knee and treatment was modified to reduced exercise intensity.  The patient is able complete 2 laps inside the clinic with "light" perceived exertional level.  He does report dizziness with weight shifting ex's.  2 sitting breaks needed between ex's.  Therapist closely monitoring response with all treatment interventions and providing stand by contact for safety due to fall risk.    Personal Factors and Comorbidities  Age;Past/Current Experience;Comorbidity 2;Comorbidity 3+;Time since onset of injury/illness/exacerbation    Rehab Potential  Good    Clinical Impairments Affecting Rehab Potential  risk for falls    PT Frequency  2x / week    PT Duration  6 weeks    PT Treatment/Interventions  ADLs/Self Care Home Management;Cryotherapy;Electrical Stimulation;Ultrasound;Moist Heat;Iontophoresis 25m/ml  Dexamethasone;Gait training;Stair training;Therapeutic activities;Therapeutic exercise;Patient/family education;Neuromuscular re-education;Manual techniques;Taping;Dry needling    PT Next Visit Plan  KX: walk laps every visit with RW  gait endurance; resume weighted  LE strengthening as able    PT Home Exercise Plan  Access Code: [email or web address]        Patient will benefit from skilled therapeutic intervention in order to improve the following deficits and impairments:  Pain, Decreased range of motion, Decreased strength, Decreased activity tolerance, Impaired perceived functional ability, Difficulty walking, Decreased balance  Visit Diagnosis: 1. Stiffness of right knee, not elsewhere classified   2. Muscle weakness (generalized)   3. Chronic pain of right knee        Problem List Patient Active Problem List   Diagnosis Date Noted  . Strain of left piriformis muscle 10/05/2018  . Pain due to onychomycosis of toenails of both feet 09/23/2018  . Chronic left SI joint pain 04/27/2018  . Right wrist pain 02/05/2018  . Degenerative arthritis of right knee 10/23/2017  . Left leg cellulitis 08/12/2017  . Arthritis of right knee 05/20/2017  . Diverticulosis of colon with hemorrhage   . Acute blood loss anemia   . GIB (gastrointestinal bleeding) 03/12/2017  . Leg edema, right 02/20/2015  . BPH (benign prostatic hyperplasia) 12/09/2014  . Chronic radicular low back pain 08/06/2012  . COPD (chronic obstructive pulmonary disease) ([place] 04/21/2012  . Syncope 03/29/2012  . Hyponatremia 03/29/2012  . Localized osteoarthrosis not specified whether primary or secondary, lower leg 06/20/2010  . VARICOSE VEINS LOWER EXTREMITIES W/INFLAMMATION 02/12/2010  . [place] OSTE[place]ROSIS INVOLVING HAND 10/16/2009  . DUPUYTREN'S CONTRACTURE, RIGHT 07/04/2009  . DEGENERATIVE DISC DISEASE, LUMBOSACRAL SPINE W/RADICULOPATHY 06/19/2009  . CARCINOMA, BASAL CELL 02/07/2009  . ANEMIA OF CHRONIC DISEASE  02/07/2009  . BURSITIS, RIGHT SHOULDER 10/07/2008  . CATARACT ASSOCIATED WITH OTHER SYNDROMES 08/01/2008  . DRY SKIN 08/01/2008  . CERUMEN IMPACTION, BILATERAL 03/01/2008  . PMR (polymyalgia rheumatica) ([place] 01/18/2008  . CRAMP IN LIMB 01/18/2008  . DEPRESSION, SITUATIONAL, ACUTE 12/28/2007  . CONSTIPATION, DRUG INDUCED 12/28/2007  . POLYNEUROPATHY OTHER DISEASES CLASSIFIED ELSW 09/24/2007  . WEIGHT LOSS, ABNORMAL 01/07/2007  . Hyperlipidemia 09/22/2006  . Essential hypertension 09/22/2006  . GERD (gastroesophageal reflux disease) 09/22/2006  . Osteoarthritis 09/22/2006   SRuben Im PT 10/08/18 10:20 AM Phone: [phone]: [phone] Singh7/16/2020, 10:19 AM  [facility] [address] [place] [address]Phone: [phone]  Fax:  [phone] Name: PShondale QuinleyMRN: [id number] of Birth: [birth year]

## 2018-10-12 ENCOUNTER — Other Ambulatory Visit: Payer: Self-pay | Admitting: *Deleted

## 2018-10-12 DIAGNOSIS — G8929 Other chronic pain: Secondary | ICD-10-CM

## 2018-10-13 ENCOUNTER — Encounter: Payer: Self-pay | Admitting: Physical Therapy

## 2018-10-13 ENCOUNTER — Other Ambulatory Visit: Payer: Self-pay

## 2018-10-13 ENCOUNTER — Ambulatory Visit: Payer: Medicare Other | Admitting: Physical Therapy

## 2018-10-13 DIAGNOSIS — G8929 Other chronic pain: Secondary | ICD-10-CM

## 2018-10-13 DIAGNOSIS — R296 Repeated falls: Secondary | ICD-10-CM | POA: Diagnosis not present

## 2018-10-13 DIAGNOSIS — M25561 Pain in right knee: Secondary | ICD-10-CM | POA: Diagnosis not present

## 2018-10-13 DIAGNOSIS — M25661 Stiffness of right knee, not elsewhere classified: Secondary | ICD-10-CM | POA: Diagnosis not present

## 2018-10-13 DIAGNOSIS — M6281 Muscle weakness (generalized): Secondary | ICD-10-CM

## 2018-10-13 NOTE — Therapy (Signed)
[facility] [address], [place], [place], [address] Phone: [phone]   Fax:  [phone]  Physical Therapy Treatment  Patient Details  Name: Darin [name] MRN: [id number] Date of Birth: 12/18/1931 Referring Provider (PT): Dr. Hulan Saas    Encounter Date: 10/13/2018  PT End of Session - 10/13/18 0836    Visit Number  35    Date for PT Re-Evaluation  10/27/18    Authorization Type  Medicare     PT Start Time  5726    PT Stop Time  2035   patient late and in bathroom extended period of time in the middle of session    PT Time Calculation (min)  37 min    Activity Tolerance  Patient tolerated treatment well       Past Medical History:  Diagnosis Date  . Arthritis   . GERD (gastroesophageal reflux disease)   . H/O hiatal hernia    "FOR YEARS" " NO PROBLEM"  . Hyperlipidemia   . Hypertension   . Knee pain   . Neuropathy    PERIPHERAL   . Osteoporosis   . PMR (polymyalgia rheumatica) (HCC)   . Polymyalgia ([place]) 5 YEARS    Past Surgical History:  Procedure Laterality Date  . CARPAL TUNNEL RELEASE    . CATARACT EXTRACTION     right  . COLONOSCOPY  2007  . INGUINAL HERNIA REPAIR  10/14/2011   Procedure: HERNIA REPAIR INGUINAL ADULT;  Surgeon: [name], MD;  Location: [place];  Service: General;  Laterality: Right;  Right Inguinal Hernia Repair with Mesh  . TONSILLECTOMY    . VARICOSE VEIN SURGERY      There were no vitals filed for this visit.  Subjective Assessment - 10/13/18 0839    Subjective  "I'm the usual wreck but nothing unusual.   I'm falling apart."  Getting stitches out Friday from the place where skin cancer removed.  My hip is "bad."  On 7/31 I'm getting an epidural.    Pertinent History  ESI on 7/31    Currently in Pain?  Yes    Pain Score  7     Pain Location  Hip    Pain Orientation  Left    Pain Type  Chronic pain                       [facility] Adult PT  Treatment/Exercise - 10/13/18 0001      Ambulation/Gait   Gait Comments  5:30 minutes with RW "8 out of 20 fatigue level"  150 feet   perceived exertion from 1-20 "5-6" level      Knee/Hip Exercises: Aerobic   Nustep  seat 13 arms 11 L3 10 min    PT present to discuss status and modifications to treatment      Knee/Hip Exercises: Standing   Heel Raises  Both;1 set;15 reps    Gait Training  staggered standing and narrow base of support with green band UE shoulder extensions 3x 10     Other Standing Knee Exercises  wall climbs 10x    Other Standing Knee Exercises  backwards walking holding green band handles 8x       Knee/Hip Exercises: Seated   Long Arc Quad  Strengthening;Right;Left;2 sets;10 reps    Long Arc Quad Weight  4 lbs.    Hamstring Curl  Strengthening;Right;Left;15 reps      Knee/Hip Exercises: Supine   Other Supine Knee/Hip  Exercises  green band Pilates style push downs 15x right/left                PT Short Term Goals - 09/15/18 0911      PT SHORT TERM GOAL #1   Title  be independent in initial HEP      Status  Achieved      PT SHORT TERM GOAL #2   Title  The patient will have improved right knee ROM 3-120 degrees needed for greater ease getting up and down from the chair and in/out of the car    Status  Not Met      PT SHORT TERM GOAL #3   Title  The patient will have improved hip flexor muscle length to 5 degrees and HS length to 65 degrees needed for improved stride length with ambulation    Status  Achieved      PT SHORT TERM GOAL #4   Title  Timed up and Go to 16 sec indicating improved gait safety    Status  Revised      PT SHORT TERM GOAL #5   Title  Ten meter walk test to 14 sec indicate improved gait speed needed for community ambulation     Status  Revised        PT Long Term Goals - 09/15/18 0925      PT LONG TERM GOAL #1   Title  be independent in advanced HEP and return to gym program to prevent declines in strength, ROM and  balance    Time  6    Period  Weeks    Status  Revised    Target Date  10/27/18      PT LONG TERM GOAL #2   Title  The patient will be able to walk to the mailbox and to the curb to get the newspaper 150 feet each way with knee and hip pain 5/10 or less    Time  6    Period  Weeks    Status  New      PT LONG TERM GOAL #3   Title  Improved bil hip abduction, extension and right knee extension strength to 4/5 needed for greater ease with sit to stand and walking/standing    Time  6    Period  Weeks    Status  Revised      PT LONG TERM GOAL #4   Title  The patient will be able to walk 10 m in < 16 sec without looking at the ground    Time  6    Period  Weeks    Status  Revised      PT LONG TERM GOAL #5   Title  The patient will be able to ambulate 500 feet in six minutes with RW needed for community ambulation    Status  Revised            Plan - 10/13/18 0845    Clinical Impression Statement  The patient continues to be limited by multi region joint pain especially in his left hip.  He reports no improvement with recent hip injections and will have an ESI on 7/31.  Treatment continues to be modified secondary to hip pain but also right knee give way and pain.  He reports fairly quick fatigue with standing exercises and treatment modified at that time to do seated LE strengthening.    Rehab Potential  Good    Clinical Impairments Affecting Rehab  Potential  risk for falls    PT Frequency  2x / week    PT Duration  6 weeks    PT Treatment/Interventions  ADLs/Self Care Home Management;Cryotherapy;Electrical Stimulation;Ultrasound;Moist Heat;Iontophoresis 57m/ml Dexamethasone;Gait training;Stair training;Therapeutic activities;Therapeutic exercise;Patient/family education;Neuromuscular re-education;Manual techniques;Taping;Dry needling    PT Next Visit Plan  KX: walk laps every visit with RW  gait endurance; resume weighted  LE strengthening as able    PT Home Exercise Plan   Access Code: [email or web address]        Patient will benefit from skilled therapeutic intervention in order to improve the following deficits and impairments:  Pain, Decreased range of motion, Decreased strength, Decreased activity tolerance, Impaired perceived functional ability, Difficulty walking, Decreased balance  Visit Diagnosis: 1. Stiffness of right knee, not elsewhere classified   2. Muscle weakness (generalized)   3. Chronic pain of right knee        Problem List Patient Active Problem List   Diagnosis Date Noted  . Strain of left piriformis muscle 10/05/2018  . Pain due to onychomycosis of toenails of both feet 09/23/2018  . Chronic left SI joint pain 04/27/2018  . Right wrist pain 02/05/2018  . Degenerative arthritis of right knee 10/23/2017  . Left leg cellulitis 08/12/2017  . Arthritis of right knee 05/20/2017  . Diverticulosis of colon with hemorrhage   . Acute blood loss anemia   . GIB (gastrointestinal bleeding) 03/12/2017  . Leg edema, right 02/20/2015  . BPH (benign prostatic hyperplasia) 12/09/2014  . Chronic radicular low back pain 08/06/2012  . COPD (chronic obstructive pulmonary disease) ([place] 04/21/2012  . Syncope 03/29/2012  . Hyponatremia 03/29/2012  . Localized osteoarthrosis not specified whether primary or secondary, lower leg 06/20/2010  . VARICOSE VEINS LOWER EXTREMITIES W/INFLAMMATION 02/12/2010  . [place] OSTEO[place]ROS[place] INVOLVING HAND 10/16/2009  . DUPUYTREN'S CONTRACTURE, RIGHT 07/04/2009  . DEGENERATIVE DISC DISEASE, LUMBOSACRAL SPINE W/RADICULOPATHY 06/19/2009  . CARCINOMA, BASAL CELL 02/07/2009  . ANEMIA OF CHRONIC DISEASE 02/07/2009  . BURSITIS, RIGHT SHOULDER 10/07/2008  . CATARACT ASSOCIATED WITH OTHER SYNDROMES 08/01/2008  . DRY SKIN 08/01/2008  . CERUMEN IMPACTION, BILATERAL 03/01/2008  . PMR ([place]algia [place]a) [place] 01/18/2008  . CRAMP IN LIMB 01/18/2008  . DEPRESSION, SITUATIONAL, ACUTE 12/28/2007  . CONSTIPATION, DRUG  INDUCED 12/28/2007  . POLYNEUROPATHY OTHER DISEASES CLASSIFIED ELSW 09/24/2007  . WEIGHT LOSS, ABNORMAL 01/07/2007  . Hyperlipidemia 09/22/2006  . Essential hypertension 09/22/2006  . GERD (gastroesophageal reflux disease) 09/22/2006  . Osteoarthritis 09/22/2006   SRuben Im PT 10/13/18 12:39 PM Phone: [phone]: [phone] Singh7/21/2020, 12:38 PM  [facility] [address] [place] [id number]: [phone]  Fax:  [phone] Name: PJorel GravlinMRN: [id number] of Birth: 702-23-1933

## 2018-10-16 ENCOUNTER — Other Ambulatory Visit: Payer: Self-pay

## 2018-10-16 ENCOUNTER — Encounter: Payer: Self-pay | Admitting: Physical Therapy

## 2018-10-16 ENCOUNTER — Ambulatory Visit: Payer: Medicare Other | Admitting: Physical Therapy

## 2018-10-16 DIAGNOSIS — M6281 Muscle weakness (generalized): Secondary | ICD-10-CM

## 2018-10-16 DIAGNOSIS — G8929 Other chronic pain: Secondary | ICD-10-CM | POA: Diagnosis not present

## 2018-10-16 DIAGNOSIS — M25661 Stiffness of right knee, not elsewhere classified: Secondary | ICD-10-CM | POA: Diagnosis not present

## 2018-10-16 DIAGNOSIS — M25561 Pain in right knee: Secondary | ICD-10-CM

## 2018-10-16 DIAGNOSIS — R296 Repeated falls: Secondary | ICD-10-CM

## 2018-10-16 NOTE — Therapy (Signed)
[facility] [address] [place], [place], [address] Phone: [phone]   Fax:  [phone]  Physical Therapy Treatment  Patient Details  Name: Darin [name] MRN: [id number] Date of Birth: [birth year] Referring Provider (PT): Dr. Hulan Saas    Encounter Date: 10/16/2018  PT End of Session - 10/16/18 0919    Visit Number  40    Date for PT Re-Evaluation  10/27/18    Authorization Type  Medicare     PT Start Time  0903    PT Stop Time  0941    PT Time Calculation (min)  38 min    Activity Tolerance  Patient tolerated treatment well       Past Medical History:  Diagnosis Date  . Arthritis   . GERD (gastroesophageal reflux disease)   . H/O hiatal hernia    "FOR YEARS" " NO PROBLEM"  . Hyperlipidemia   . Hypertension   . Knee pain   . Neuropathy    PERIPHERAL   . Osteoporosis   . PMR (polymyalgia rheumatica) (HCC)   . Polymyalgia ([place]) 5 YEARS    Past Surgical History:  Procedure Laterality Date  . CARPAL TUNNEL RELEASE    . CATARACT EXTRACTION     right  . COLONOSCOPY  2007  . INGUINAL HERNIA REPAIR  10/14/2011   Procedure: HERNIA REPAIR INGUINAL ADULT;  Surgeon: [name], MD;  Location: [facility];  Service: General;  Laterality: Right;  Right Inguinal Hernia Repair with Mesh  . TONSILLECTOMY    . VARICOSE VEIN SURGERY      There were no vitals filed for this visit.  Subjective Assessment - 10/16/18 0905    Subjective  Pt states things are ok. He is getting his ESI on Friday. His pain is not so good because of the rain.    Pertinent History  ESI on 7/31    Currently in Pain?  --   pain in hip and all over, no rating given                      [facility] Adult PT Treatment/Exercise - 10/16/18 0001      Ambulation/Gait   Gait Comments  137f x2 trials completed in 2:15 and 2:08      Knee/Hip Exercises: Aerobic   Nustep  seat 13 arms 11 L3 10 min     PT present to discuss  plan for session     Knee/Hip Exercises: Machines for Strengthening   Cybex Leg Press  Seat 9: #100 2x50 reps BLE (pt only completing through partial range); single leg #40 x50 reps each (pt completed through greater range with this)       Knee/Hip Exercises: Standing   Gait Training  staggered standing and narrow base of support with green band UE rows 3x 10              PT Education - 10/16/18 0918    Education Details  encouraged pt to return to leg press as his symptoms are likely from his back and will remain unchanged by this.    Person(s) Educated  Patient    Methods  Explanation    Comprehension  Verbalized understanding       PT Short Term Goals - 09/15/18 0911      PT SHORT TERM GOAL #1   Title  be independent in initial HEP      Status  Achieved  PT SHORT TERM GOAL #2   Title  The patient will have improved right knee ROM 3-120 degrees needed for greater ease getting up and down from the chair and in/out of the car    Status  Not Met      PT SHORT TERM GOAL #3   Title  The patient will have improved hip flexor muscle length to 5 degrees and HS length to 65 degrees needed for improved stride length with ambulation    Status  Achieved      PT SHORT TERM GOAL #4   Title  Timed up and Go to 16 sec indicating improved gait safety    Status  Revised      PT SHORT TERM GOAL #5   Title  Ten meter walk test to 14 sec indicate improved gait speed needed for community ambulation     Status  Revised        PT Long Term Goals - 09/15/18 0925      PT LONG TERM GOAL #1   Title  be independent in advanced HEP and return to gym program to prevent declines in strength, ROM and balance    Time  6    Period  Weeks    Status  Revised    Target Date  10/27/18      PT LONG TERM GOAL #2   Title  The patient will be able to walk to the mailbox and to the curb to get the newspaper 150 feet each way with knee and hip pain 5/10 or less    Time  6    Period  Weeks     Status  New      PT LONG TERM GOAL #3   Title  Improved bil hip abduction, extension and right knee extension strength to 4/5 needed for greater ease with sit to stand and walking/standing    Time  6    Period  Weeks    Status  Revised      PT LONG TERM GOAL #4   Title  The patient will be able to walk 10 m in < 16 sec without looking at the ground    Time  6    Period  Weeks    Status  Revised      PT LONG TERM GOAL #5   Title  The patient will be able to ambulate 500 feet in six minutes with RW needed for community ambulation    Status  Revised            Plan - 10/16/18 0921    Clinical Impression Statement  Pt continues to have pain in the hip which has not resolved with previous hip injection. Pt demonstrates improved endurance evident by his ability to complete 2 laps around the gym without slowing his pace, despite generalized fatigue and minimal rest breaks between the two trials. He was encouraged to complete leg press this session, as this has several benefits for his knee strength. He was agreeable to this and was able to complete without any increase in back/hip pain during or following. Will continue to encourage return to LE strengthening exercises and monitor pt response.    Rehab Potential  Good    Clinical Impairments Affecting Rehab Potential  risk for falls    PT Frequency  2x / week    PT Duration  6 weeks    PT Treatment/Interventions  ADLs/Self Care Home Management;Cryotherapy;Electrical Stimulation;Ultrasound;Moist Heat;Iontophoresis 39m/ml Dexamethasone;Gait training;Stair training;Therapeutic  activities;Therapeutic exercise;Patient/family education;Neuromuscular re-education;Manual techniques;Taping;Dry needling    PT Next Visit Plan  KX: walk laps every visit with RW  gait endurance; resume weighted  LE strengthening as able    PT Home Exercise Plan  Access Code: [email or web address]        Patient will benefit from skilled therapeutic intervention in order to  improve the following deficits and impairments:  Pain, Decreased range of motion, Decreased strength, Decreased activity tolerance, Impaired perceived functional ability, Difficulty walking, Decreased balance  Visit Diagnosis: 1. Stiffness of right knee, not elsewhere classified   2. Muscle weakness (generalized)   3. Chronic pain of right knee   4. Repeated falls        Problem List Patient Active Problem List   Diagnosis Date Noted  . Strain of left piriformis muscle 10/05/2018  . Pain due to onychomycosis of toenails of both feet 09/23/2018  . Chronic left SI joint pain 04/27/2018  . Right wrist pain 02/05/2018  . Degenerative arthritis of right knee 10/23/2017  . Left leg cellulitis 08/12/2017  . Arthritis of right knee 05/20/2017  . Diverticulosis of colon with hemorrhage   . Acute blood loss anemia   . GIB (gastrointestinal bleeding) 03/12/2017  . Leg edema, right 02/20/2015  . BPH (benign prostatic hyperplasia) 12/09/2014  . Chronic radicular low back pain 08/06/2012  . COPD (chronic obstructive pulmonary disease) ([place]) 04/21/2012  . Syncope 03/29/2012  . Hyponatremia 03/29/2012  . Localized osteoarthrosis not specified whether primary or secondary, lower leg 06/20/2010  . VARICOSE VEINS LOWER EXTREMITIES W/INFLAMMATION 02/12/2010  . [place] LOCALIZED OSTEOARTHROSIS INVOLVING HAND 10/16/2009  . DUPUYTREN'S CONTRACTURE, RIGHT 07/04/2009  . DEGENERATIVE DISC DISEASE, LUMBOSACRAL SPINE W/RADICULOPATHY 06/19/2009  . CARCINOMA, BASAL CELL 02/07/2009  . ANEMIA OF CHRONIC DISEASE 02/07/2009  . BURSITIS, RIGHT SHOULDER 10/07/2008  . CATARACT ASSOCIATED WITH OTHER SYNDROMES 08/01/2008  . DRY SKIN 08/01/2008  . CERUMEN IMPACTION, BILATERAL 03/01/2008  . PMR (polymyalgia rheumatica) ([place]) 01/18/2008  . CRAMP IN LIMB 01/18/2008  . DEPRESSION, SITUATIONAL, ACUTE 12/28/2007  . CONSTIPATION, DRUG INDUCED 12/28/2007  . POLYNEUROPATHY OTHER DISEASES CLASSIFIED ELSW 09/24/2007  .  WEIGHT LOSS, ABNORMAL 01/07/2007  . Hyperlipidemia 09/22/2006  . Essential hypertension 09/22/2006  . GERD (gastroesophageal reflux disease) 09/22/2006  . Osteoarthritis 09/22/2006    9:49 AM,10/16/18 Sherol Dade PT, DPT [place] [facility] [place]  [facility] [address] [place], [place], [address] Phone: [phone]   Fax:  [phone]  Name: Reichen Hutzler MRN: [id number] Date of Birth: Aug 10, 1931

## 2018-10-18 ENCOUNTER — Other Ambulatory Visit: Payer: Self-pay | Admitting: Family Medicine

## 2018-10-20 ENCOUNTER — Other Ambulatory Visit: Payer: Self-pay

## 2018-10-20 ENCOUNTER — Encounter: Payer: Self-pay | Admitting: Physical Therapy

## 2018-10-20 ENCOUNTER — Ambulatory Visit: Payer: Medicare Other | Admitting: Physical Therapy

## 2018-10-20 DIAGNOSIS — M25561 Pain in right knee: Secondary | ICD-10-CM | POA: Diagnosis not present

## 2018-10-20 DIAGNOSIS — M6281 Muscle weakness (generalized): Secondary | ICD-10-CM

## 2018-10-20 DIAGNOSIS — R296 Repeated falls: Secondary | ICD-10-CM | POA: Diagnosis not present

## 2018-10-20 DIAGNOSIS — G8929 Other chronic pain: Secondary | ICD-10-CM | POA: Diagnosis not present

## 2018-10-20 DIAGNOSIS — M25661 Stiffness of right knee, not elsewhere classified: Secondary | ICD-10-CM | POA: Diagnosis not present

## 2018-10-20 NOTE — Therapy (Signed)
[facility] [address] [place], [place], [address] Phone: [phone]   Fax:  [phone]  Physical Therapy Treatment  Patient Details  Name: Darin [name] MRN: [id number] Date of Birth: 06-04-1931 Referring Provider (PT): Dr. Hulan Saas    Encounter Date: 10/20/2018  PT End of Session - 10/20/18 0850    Visit Number  52    Date for PT Re-Evaluation  10/27/18    Authorization Type  Medicare     PT Start Time  0830    PT Stop Time  0913    PT Time Calculation (min)  43 min    Activity Tolerance  Patient tolerated treatment well       Past Medical History:  Diagnosis Date  . Arthritis   . GERD (gastroesophageal reflux disease)   . H/O hiatal hernia    "FOR YEARS" " NO PROBLEM"  . Hyperlipidemia   . Hypertension   . Knee pain   . Neuropathy    PERIPHERAL   . Osteoporosis   . PMR (polymyalgia rheumatica) (HCC)   . Polymyalgia ([place]) 5 YEARS    Past Surgical History:  Procedure Laterality Date  . CARPAL TUNNEL RELEASE    . CATARACT EXTRACTION     right  . COLONOSCOPY  2007  . INGUINAL HERNIA REPAIR  10/14/2011   Procedure: HERNIA REPAIR INGUINAL ADULT;  Surgeon: [name], MD;  Location: [facility];  Service: General;  Laterality: Right;  Right Inguinal Hernia Repair with Mesh  . TONSILLECTOMY    . VARICOSE VEIN SURGERY      There were no vitals filed for this visit.  Subjective Assessment - 10/20/18 0832    Subjective  My right knee is acting up.  This heat and humidity is not helping.  States he did OK on the leg press last time, it didn't seem to bother his hip or knee.  Slight twinge left hip.    Pertinent History  ESI on 7/31    Currently in Pain?  Yes    Pain Score  6     Pain Location  Knee    Pain Orientation  Right    Pain Type  Chronic pain    Pain Score  3    Pain Location  Hip    Pain Orientation  Left    Pain Type  Chronic pain                       [facility]  Adult PT Treatment/Exercise - 10/20/18 0001      Ambulation/Gait   Gait Comments  145 feet 2:05;        Knee/Hip Exercises: Aerobic   Nustep  seat 13 arms 11 L3 10 min     PT present to discuss plan for session     Knee/Hip Exercises: Machines for Strengthening   Cybex Leg Press  Seat 9: #100 2x50 reps BLE (pt only completing through partial range); single leg #40 x60 reps each (pt completed through greater range with this)       Knee/Hip Exercises: Standing   Heel Raises  Both;1 set;15 reps    Forward Step Up  Right;Left;5 reps;Hand Hold: 2;Step Height: 6"    Gait Training  staggered stand with UE reaching 2x 10 right/left     Other Standing Knee Exercises  alternating step taps 10x       Knee/Hip Exercises: Seated   Clamshell with TheraBand  Blue  30x   Hamstring Curl  Strengthening;Right;Left;15 reps    Hamstring Limitations  red band               PT Short Term Goals - 09/15/18 0911      PT SHORT TERM GOAL #1   Title  be independent in initial HEP      Status  Achieved      PT SHORT TERM GOAL #2   Title  The patient will have improved right knee ROM 3-120 degrees needed for greater ease getting up and down from the chair and in/out of the car    Status  Not Met      PT SHORT TERM GOAL #3   Title  The patient will have improved hip flexor muscle length to 5 degrees and HS length to 65 degrees needed for improved stride length with ambulation    Status  Achieved      PT SHORT TERM GOAL #4   Title  Timed up and Go to 16 sec indicating improved gait safety    Status  Revised      PT SHORT TERM GOAL #5   Title  Ten meter walk test to 14 sec indicate improved gait speed needed for community ambulation     Status  Revised        PT Long Term Goals - 09/15/18 0925      PT LONG TERM GOAL #1   Title  be independent in advanced HEP and return to gym program to prevent declines in strength, ROM and balance    Time  6    Period  Weeks    Status  Revised     Target Date  10/27/18      PT LONG TERM GOAL #2   Title  The patient will be able to walk to the mailbox and to the curb to get the newspaper 150 feet each way with knee and hip pain 5/10 or less    Time  6    Period  Weeks    Status  New      PT LONG TERM GOAL #3   Title  Improved bil hip abduction, extension and right knee extension strength to 4/5 needed for greater ease with sit to stand and walking/standing    Time  6    Period  Weeks    Status  Revised      PT LONG TERM GOAL #4   Title  The patient will be able to walk 10 m in < 16 sec without looking at the ground    Time  6    Period  Weeks    Status  Revised      PT LONG TERM GOAL #5   Title  The patient will be able to ambulate 500 feet in six minutes with RW needed for community ambulation    Status  Revised            Plan - 10/20/18 0850    Clinical Impression Statement  The patient complains of more knee pain than hip pain today.  He is able to complete LE strengthening on leg press without exacerbation of knee pain.  He continues to need rest breaks between exercises secondary to general fatigue.  Therapist closely monitoring response with all and providing supervision for safety secondary to fall risk.  Difficulty with staggered standing without UE assist.  He reports his knee pain is no worse following treatment session.  Personal Factors and Comorbidities  Age;Past/Current Experience;Comorbidity 2;Comorbidity 3+;Time since onset of injury/illness/exacerbation    Rehab Potential  Good    Clinical Impairments Affecting Rehab Potential  risk for falls    PT Frequency  2x / week    PT Duration  6 weeks    PT Treatment/Interventions  ADLs/Self Care Home Management;Cryotherapy;Electrical Stimulation;Ultrasound;Moist Heat;Iontophoresis 30m/ml Dexamethasone;Gait training;Stair training;Therapeutic activities;Therapeutic exercise;Patient/family education;Neuromuscular re-education;Manual techniques;Taping;Dry needling     PT Next Visit Plan  KX: walk laps every visit with RW  gait endurance;  LE strengthening progression    PT Home Exercise Plan  Access Code: [email or web address]        Patient will benefit from skilled therapeutic intervention in order to improve the following deficits and impairments:  Pain, Decreased range of motion, Decreased strength, Decreased activity tolerance, Impaired perceived functional ability, Difficulty walking, Decreased balance  Visit Diagnosis: 1. Stiffness of right knee, not elsewhere classified   2. Muscle weakness (generalized)   3. Chronic pain of right knee        Problem List Patient Active Problem List   Diagnosis Date Noted  . Strain of left piriformis muscle 10/05/2018  . Pain due to onychomycosis of toenails of both feet 09/23/2018  . Chronic left SI joint pain 04/27/2018  . Right wrist pain 02/05/2018  . Degenerative arthritis of right knee 10/23/2017  . Left leg cellulitis 08/12/2017  . Arthritis of right knee 05/20/2017  . Diverticulosis of colon with hemorrhage   . Acute blood loss anemia   . GIB (gastrointestinal bleeding) 03/12/2017  . Leg edema, right 02/20/2015  . BPH (benign prostatic hyperplasia) 12/09/2014  . Chronic radicular low back pain 08/06/2012  . COPD (chronic obstructive pulmonary disease) ([place] 04/21/2012  . Syncope 03/29/2012  . Hyponatremia 03/29/2012  . Localized osteoarthrosis not specified whether primary or secondary, lower leg 06/20/2010  . VARICOSE VEINS LOWER EXTREMITIES W/INFLAMMATION 02/12/2010  . [place]STE[place] 10/16/2009  . DUPUYTREN'S CONTRACTURE, RIGHT 07/04/2009  . DEGENERATIVE DISC DISEASE, LUMBOSACRAL SPINE W/RADICULOPATHY 06/19/2009  . CARCINOMA, BASAL CELL 02/07/2009  . ANEMIA OF CHRONIC DISEASE 02/07/2009  . BURSITIS, RIGHT SHOULDER 10/07/2008  . CATARACT ASSOCIATED WITH OTHER SYNDROMES 08/01/2008  . DRY SKIN 08/01/2008  . CERUMEN IMPACTION, BILATERAL 03/01/2008  . PMR (polymyalgia  rheumatica) ([place] 01/18/2008  . CRAMP IN LIMB 01/18/2008  . DEPRESSION, SITUATIONAL, ACUTE 12/28/2007  . CONSTIPATION, DRUG INDUCED 12/28/2007  . POLYNEUROPATHY OTHER DISEASES CLASSIFIED ELSW 09/24/2007  . WEIGHT LOSS, ABNORMAL 01/07/2007  . Hyperlipidemia 09/22/2006  . Essential hypertension 09/22/2006  . GERD (gastroesophageal reflux disease) 09/22/2006  . Osteoarthritis 09/22/2006   SRuben Im PT 10/20/18 9:28 AM Phone: [phone]: [phone] Singh7/28/2020, 9:27 AM  [facility] [address] [place] [address]Phone: [phone]  Fax:  [phone] Name: Darin LoezaMRN: [id number] of Birth: 705-08-1931

## 2018-10-22 ENCOUNTER — Other Ambulatory Visit: Payer: Self-pay

## 2018-10-22 ENCOUNTER — Ambulatory Visit: Payer: Medicare Other | Admitting: Physical Therapy

## 2018-10-22 ENCOUNTER — Encounter: Payer: Self-pay | Admitting: Physical Therapy

## 2018-10-22 DIAGNOSIS — M25661 Stiffness of right knee, not elsewhere classified: Secondary | ICD-10-CM

## 2018-10-22 DIAGNOSIS — M6281 Muscle weakness (generalized): Secondary | ICD-10-CM

## 2018-10-22 DIAGNOSIS — G8929 Other chronic pain: Secondary | ICD-10-CM

## 2018-10-22 DIAGNOSIS — M25561 Pain in right knee: Secondary | ICD-10-CM | POA: Diagnosis not present

## 2018-10-22 DIAGNOSIS — R296 Repeated falls: Secondary | ICD-10-CM | POA: Diagnosis not present

## 2018-10-22 NOTE — Therapy (Signed)
[facility] [address] [place], [place], [address] Phone: [phone]   Fax:  [phone]  Physical Therapy Treatment  Patient Details  Name: Darin [name] MRN: [id number] Date of Birth: 03/27/1931 Referring Provider (PT): Dr. Hulan Saas    Encounter Date: 10/22/2018  PT End of Session - 10/22/18 0832    Visit Number  42    Date for PT Re-Evaluation  10/27/18    Authorization Type  Medicare     PT Start Time  0825    PT Stop Time  0917   in bathroom for a period of time during session   PT Time Calculation (min)  52 min    Activity Tolerance  Patient tolerated treatment well       Past Medical History:  Diagnosis Date  . Arthritis   . GERD (gastroesophageal reflux disease)   . H/O hiatal hernia    "FOR YEARS" " NO PROBLEM"  . Hyperlipidemia   . Hypertension   . Knee pain   . Neuropathy    PERIPHERAL   . Osteoporosis   . PMR (polymyalgia rheumatica) (HCC)   . Polymyalgia ([place]) 5 YEARS    Past Surgical History:  Procedure Laterality Date  . CARPAL TUNNEL RELEASE    . CATARACT EXTRACTION     right  . COLONOSCOPY  2007  . INGUINAL HERNIA REPAIR  10/14/2011   Procedure: HERNIA REPAIR INGUINAL ADULT;  Surgeon: [name], MD;  Location: [facility];  Service: General;  Laterality: Right;  Right Inguinal Hernia Repair with Mesh  . TONSILLECTOMY    . VARICOSE VEIN SURGERY      There were no vitals filed for this visit.  Subjective Assessment - 10/22/18 0829    Subjective  Getting ESI tomorrow.  Hip pain is there all the time.  Knee is no worse following last treatment session.  The knee doesn't hurt, it just gives way.    Currently in Pain?  Yes    Pain Score  6     Pain Location  Knee    Pain Orientation  Right    Pain Score  3    Pain Location  Hip                       [facility] Adult PT Treatment/Exercise - 10/22/18 0001      Ambulation/Gait   Gait Comments  210  feet with  RW       Knee/Hip Exercises: Stretches   Hip Flexor Stretch Limitations  2nd step hip flexor stretch 10x right/left     Other Knee/Hip Stretches  slant board stretch 30 sec right/left       Knee/Hip Exercises: Aerobic   Nustep  seat 13 arms 11 L3 10 min     PT present to discuss plan for session     Knee/Hip Exercises: Machines for Strengthening   Cybex Leg Press  Seat 9: #100 2x50 reps BLE (pt only completing through partial range); single leg #40 x75 reps each (pt completed through greater range with this)       Knee/Hip Exercises: Standing   Other Standing Knee Exercises  alternating step taps 10x       Knee/Hip Exercises: Seated   Hamstring Curl  Strengthening;Right;Left;20 reps    Hamstring Limitations  green band               PT Short Term Goals - 09/15/18 6962  PT SHORT TERM GOAL #1   Title  be independent in initial HEP      Status  Achieved      PT SHORT TERM GOAL #2   Title  The patient will have improved right knee ROM 3-120 degrees needed for greater ease getting up and down from the chair and in/out of the car    Status  Not Met      PT SHORT TERM GOAL #3   Title  The patient will have improved hip flexor muscle length to 5 degrees and HS length to 65 degrees needed for improved stride length with ambulation    Status  Achieved      PT SHORT TERM GOAL #4   Title  Timed up and Go to 16 sec indicating improved gait safety    Status  Revised      PT SHORT TERM GOAL #5   Title  Ten meter walk test to 14 sec indicate improved gait speed needed for community ambulation     Status  Revised        PT Long Term Goals - 09/15/18 0925      PT LONG TERM GOAL #1   Title  be independent in advanced HEP and return to gym program to prevent declines in strength, ROM and balance    Time  6    Period  Weeks    Status  Revised    Target Date  10/27/18      PT LONG TERM GOAL #2   Title  The patient will be able to walk to the mailbox and to the curb to  get the newspaper 150 feet each way with knee and hip pain 5/10 or less    Time  6    Period  Weeks    Status  New      PT LONG TERM GOAL #3   Title  Improved bil hip abduction, extension and right knee extension strength to 4/5 needed for greater ease with sit to stand and walking/standing    Time  6    Period  Weeks    Status  Revised      PT LONG TERM GOAL #4   Title  The patient will be able to walk 10 m in < 16 sec without looking at the ground    Time  6    Period  Weeks    Status  Revised      PT LONG TERM GOAL #5   Title  The patient will be able to ambulate 500 feet in six minutes with RW needed for community ambulation    Status  Revised            Plan - 10/22/18 0834    Clinical Impression Statement  The patient continues to have persistent left hip pain and weakness/instability in right knee that are limiting factors in prolonged weight bearing activities.  He needs sitting rest breaks secondary to aerobic challenges and general fatigue.  He also has dizziness at times with balance ex's especially with head movements.  Close supervision for safety secondary to risk of falls and symptoms response monitoring.    Personal Factors and Comorbidities  Age;Past/Current Experience;Comorbidity 2;Comorbidity 3+;Time since onset of injury/illness/exacerbation    Rehab Potential  Good    Clinical Impairments Affecting Rehab Potential  risk for falls    PT Frequency  2x / week    PT Duration  6 weeks    PT Treatment/Interventions  ADLs/Self Care Home Management;Cryotherapy;Electrical Stimulation;Ultrasound;Moist Heat;Iontophoresis 78m/ml Dexamethasone;Gait training;Stair training;Therapeutic activities;Therapeutic exercise;Patient/family education;Neuromuscular re-education;Manual techniques;Taping;Dry needling    PT Next Visit Plan  KX: follow up after ESI;  walk laps every visit with RW  gait endurance;  LE strengthening progression    PT Home Exercise Plan  Access Code:  [email or web address]        Patient will benefit from skilled therapeutic intervention in order to improve the following deficits and impairments:  Pain, Decreased range of motion, Decreased strength, Decreased activity tolerance, Impaired perceived functional ability, Difficulty walking, Decreased balance  Visit Diagnosis: 1. Stiffness of right knee, not elsewhere classified   2. Muscle weakness (generalized)   3. Chronic pain of right knee        Problem List Patient Active Problem List   Diagnosis Date Noted  . Strain of left piriformis muscle 10/05/2018  . Pain due to onychomycosis of toenails of both feet 09/23/2018  . Chronic left SI joint pain 04/27/2018  . Right wrist pain 02/05/2018  . Degenerative arthritis of right knee 10/23/2017  . Left leg cellulitis 08/12/2017  . Arthritis of right knee 05/20/2017  . Diverticulosis of colon with hemorrhage   . Acute blood loss anemia   . GIB (gastrointestinal bleeding) 03/12/2017  . Leg edema, right 02/20/2015  . BPH (benign prostatic hyperplasia) 12/09/2014  . Chronic radicular low back pain 08/06/2012  . COPD (chronic obstructive pulmonary disease) ([place] 04/21/2012  . Syncope 03/29/2012  . Hyponatremia 03/29/2012  . Localized osteoarthrosis not specified whether primary or secondary, lower leg 06/20/2010  . VARICOSE VEINS LOWER EXTREMITIES W/INFLAMMATION 02/12/2010  . [place] OSTEO[place]ROSIS INVOLVING HAND 10/16/2009  . DUPUYTREN'S CONTRACTURE, RIGHT 07/04/2009  . DEGENERATIVE DISC DISEASE, LUMBOSACRAL SPINE W/RADICULOPATHY 06/19/2009  . CARCINOMA, BASAL CELL 02/07/2009  . ANEMIA OF CHRONIC DISEASE 02/07/2009  . BURSITIS, RIGHT SHOULDER 10/07/2008  . CATARACT ASSOCIATED WITH OTHER SYNDROMES 08/01/2008  . DRY SKIN 08/01/2008  . CERUMEN IMPACTION, BILATERAL 03/01/2008  . PMR (polymyalgia rheumatica) ([place] 01/18/2008  . CRAMP IN LIMB 01/18/2008  . DEPRESSION, SITUATIONAL, ACUTE 12/28/2007  . CONSTIPATION, DRUG INDUCED 12/28/2007   . POLYNEUROPATHY OTHER DISEASES CLASSIFIED ELSW 09/24/2007  . WEIGHT LOSS, ABNORMAL 01/07/2007  . Hyperlipidemia 09/22/2006  . Essential hypertension 09/22/2006  . GERD (gastroesophageal reflux disease) 09/22/2006  . Osteoarthritis 09/22/2006   SRuben Im PT 10/22/18 9:24 AM Phone: [phone]: [phone] Singh7/30/2020, 9:24 AM  [facility] [address] [place] [address]Phone: [phone]  Fax:  [phone] Name: PTrong GoslingMRN: [id number] of Birth: [birth year]

## 2018-10-23 ENCOUNTER — Ambulatory Visit
Admission: RE | Admit: 2018-10-23 | Discharge: 2018-10-23 | Disposition: A | Discharge status: Home / Self Care | Payer: Medicare Other | Source: Ambulatory Visit | Attending: Family Medicine | Admitting: Family Medicine

## 2018-10-23 ENCOUNTER — Other Ambulatory Visit: Payer: Self-pay

## 2018-10-23 DIAGNOSIS — G8929 Other chronic pain: Secondary | ICD-10-CM

## 2018-10-23 DIAGNOSIS — M5416 Radiculopathy, lumbar region: Secondary | ICD-10-CM

## 2018-10-23 DIAGNOSIS — M545 Low back pain: Secondary | ICD-10-CM | POA: Diagnosis not present

## 2018-10-23 MED ORDER — IOPAMIDOL (ISOVUE-M 200) INJECTION 41%
1.0000 mL | Freq: Once | INTRAMUSCULAR | Status: AC
  Administered 2018-10-23: 10:00:00 1 mL via EPIDURAL

## 2018-10-23 MED ORDER — METHYLPREDNISOLONE ACETATE 40 MG/ML INJ SUSP (RADIOLOG
120.0000 mg | Freq: Once | INTRAMUSCULAR | Status: AC
  Administered 2018-10-23: 120 mg via EPIDURAL

## 2018-10-23 NOTE — Discharge Instructions (Signed)

## 2018-10-26 ENCOUNTER — Ambulatory Visit: Payer: Medicare Other

## 2018-10-27 ENCOUNTER — Ambulatory Visit: Payer: Medicare Other | Attending: Family Medicine | Admitting: Physical Therapy

## 2018-10-27 ENCOUNTER — Other Ambulatory Visit: Payer: Self-pay

## 2018-10-27 ENCOUNTER — Encounter: Payer: Self-pay | Admitting: Physical Therapy

## 2018-10-27 DIAGNOSIS — M25661 Stiffness of right knee, not elsewhere classified: Secondary | ICD-10-CM | POA: Diagnosis not present

## 2018-10-27 DIAGNOSIS — R279 Unspecified lack of coordination: Secondary | ICD-10-CM | POA: Insufficient documentation

## 2018-10-27 DIAGNOSIS — G8929 Other chronic pain: Secondary | ICD-10-CM | POA: Diagnosis not present

## 2018-10-27 DIAGNOSIS — R262 Difficulty in walking, not elsewhere classified: Secondary | ICD-10-CM | POA: Insufficient documentation

## 2018-10-27 DIAGNOSIS — M25561 Pain in right knee: Secondary | ICD-10-CM | POA: Diagnosis not present

## 2018-10-27 DIAGNOSIS — R296 Repeated falls: Secondary | ICD-10-CM | POA: Diagnosis not present

## 2018-10-27 DIAGNOSIS — M25552 Pain in left hip: Secondary | ICD-10-CM | POA: Insufficient documentation

## 2018-10-27 DIAGNOSIS — M6281 Muscle weakness (generalized): Secondary | ICD-10-CM | POA: Insufficient documentation

## 2018-10-27 NOTE — Therapy (Signed)
[facility] [address] [place], [place], [address] Phone: [phone]   Fax:  [phone]  Physical Therapy Treatment/Recertification   Patient Details  Name: Darin [name] MRN: [id number] Date of Birth: 1932/02/04 Referring Provider (PT): Dr. Hulan Saas    Encounter Date: 10/27/2018  PT End of Session - 10/27/18 0816    Visit Number  36    Date for PT Re-Evaluation  12/08/18    Authorization Type  Medicare     PT Start Time  0812    PT Stop Time  0853    PT Time Calculation (min)  41 min    Activity Tolerance  Patient tolerated treatment well       Past Medical History:  Diagnosis Date  . Arthritis   . GERD (gastroesophageal reflux disease)   . H/O hiatal hernia    "FOR YEARS" " NO PROBLEM"  . Hyperlipidemia   . Hypertension   . Knee pain   . Neuropathy    PERIPHERAL   . Osteoporosis   . PMR (polymyalgia rheumatica) (HCC)   . Polymyalgia ([place]) 5 YEARS    Past Surgical History:  Procedure Laterality Date  . CARPAL TUNNEL RELEASE    . CATARACT EXTRACTION     right  . COLONOSCOPY  2007  . INGUINAL HERNIA REPAIR  10/14/2011   Procedure: HERNIA REPAIR INGUINAL ADULT;  Surgeon: [name], MD;  Location: [place];  Service: General;  Laterality: Right;  Right Inguinal Hernia Repair with Mesh  . TONSILLECTOMY    . VARICOSE VEIN SURGERY      There were no vitals filed for this visit.  Subjective Assessment - 10/27/18 0813    Subjective  The ESI went well.  It's supposed to kick in in 3 days.  The knee is giving me fits.  It's not the pain it's the give-way.    Currently in Pain?  Yes    Pain Score  4     Pain Location  Hip    Pain Orientation  Right    Pain Score  0    Pain Location  Knee    Pain Orientation  Right    Pain Type  Chronic pain         [facility] PT Assessment - 10/27/18 0001      Assessment   Medical Diagnosis  right knee pain; weakness    Referring Provider (PT)  Dr.  Hulan Saas     Prior Therapy  yes      Strength   Right Hip Flexion  4/5    Right Hip ABduction  4-/5    Left Hip Flexion  4-/5    Left Hip ABduction  4-/5    Right Knee Extension  4-/5    Left Knee Extension  4/5    Right Ankle Dorsiflexion  4-/5    Right Ankle Plantar Flexion  4/5    Left Ankle Dorsiflexion  4-/5    Left Ankle Plantar Flexion  4/5      6 Minute Walk- Baseline   Perceived Rate of Exertion ([place])  9- very light      6 minute walk test results    Aerobic Endurance Distance Walked  315      Standardized Balance Assessment   Five times sit to stand comments   no foam 14 sec   did not straighten knees all the way out   10 Meter Walk  18.06 sec RW  Timed Up and Go Test   Normal TUG (seconds)  20.5   RW                  [facility] Adult PT Treatment/Exercise - 10/27/18 0001      Knee/Hip Exercises: Stretches   Hip Flexor Stretch Limitations  on edge of treadmill     Other Knee/Hip Stretches  slant board stretch 30 sec right/left       Knee/Hip Exercises: Aerobic   Nustep  seat 13 arms 11 L3 10 min     PT present to discuss status     Knee/Hip Exercises: Seated   Clamshell with TheraBand  Blue   30x   Other Seated Knee/Hip Exercises  seated "sit ups"    Other Seated Knee/Hip Exercises  foam roll push down 15x                PT Short Term Goals - 10/27/18 0263      PT SHORT TERM GOAL #1   Title  be independent in initial HEP      Status  Achieved      PT SHORT TERM GOAL #2   Title  The patient will have improved right knee ROM 3-120 degrees needed for greater ease getting up and down from the chair and in/out of the car      PT SHORT TERM GOAL #3   Title  The patient will have improved hip flexor muscle length to 5 degrees and HS length to 65 degrees needed for improved stride length with ambulation    Status  Achieved      PT SHORT TERM GOAL #4   Title  Timed up and Go to 16 sec indicating improved gait safety      PT  SHORT TERM GOAL #5   Title  Ten meter walk test to 14 sec indicate improved gait speed needed for community ambulation         PT Long Term Goals - 10/27/18 0849      PT LONG TERM GOAL #1   Title  be independent in advanced HEP and return to gym program to prevent declines in strength, ROM and balance    Status  On-going      PT LONG TERM GOAL #2   Title  The patient will be able to walk to the mailbox and to the curb to get the newspaper 150 feet each way with knee and hip pain 5/10 or less    Status  Achieved      PT LONG TERM GOAL #3   Title  Improved bil hip abduction, extension and right knee extension strength to 4/5 needed for greater ease with sit to stand and walking/standing    Time  6    Period  Weeks    Status  On-going      PT LONG TERM GOAL #4   Title  The patient will be able to walk 10 m in < 17 sec without looking at the ground    Time  6    Period  Weeks    Status  Revised      PT LONG TERM GOAL #5   Title  The patient will be able to ambulate 370 feet in six minutes with RW needed for community ambulation    Time  8    Period  Weeks    Status  Revised      PT LONG TERM GOAL #6   Title  Timed up and go test of 19 sec or less indicating improved LE strength and gait speed    Time  6    Period  Weeks    Status  New            Plan - 10/27/18 0840    Clinical Impression Statement  The patient has minimal minimal improvement in objective measurements however the patient's condition demonstrates that skilled care is necessary for the performance of a safe and effective maintenance program.  A maintenance ex program is needed to maintain the patient's current condition and slow down further deterioration.  He needs modifications of exercise at times for hip and knee pain as well as close supervision for safety when standing.  He also has dizziness and knee give way at times.  Short, shuffled steps with gait and cues needed to hold his head up when  ambulating with his walker but able to walk 6 minutes slowly without a seated rest break.    Recommend continued skilled PT.    Personal Factors and Comorbidities  Age;Past/Current Experience;Comorbidity 2;Comorbidity 3+;Time since onset of injury/illness/exacerbation    Rehab Potential  Good    Clinical Impairments Affecting Rehab Potential  risk for falls    PT Frequency  2x / week    PT Duration  6 weeks    PT Treatment/Interventions  ADLs/Self Care Home Management;Cryotherapy;Electrical Stimulation;Ultrasound;Moist Heat;Iontophoresis 4mg /ml Dexamethasone;Gait training;Stair training;Therapeutic activities;Therapeutic exercise;Patient/family education;Neuromuscular re-education;Manual techniques;Taping;Dry needling    PT Next Visit Plan  KX; walk laps every visit with RW  gait endurance;  LE strengthening progression    PT Home Exercise Plan  Access Code: [email or web address]        Patient will benefit from skilled therapeutic intervention in order to improve the following deficits and impairments:  Pain, Decreased range of motion, Decreased strength, Decreased activity tolerance, Impaired perceived functional ability, Difficulty walking, Decreased balance  Visit Diagnosis: 1. Stiffness of right knee, not elsewhere classified   2. Muscle weakness (generalized)   3. Chronic pain of right knee        Problem List Patient Active Problem List   Diagnosis Date Noted  . Strain of left piriformis muscle 10/05/2018  . Pain due to onychomycosis of toenails of both feet 09/23/2018  . Chronic left SI joint pain 04/27/2018  . Right wrist pain 02/05/2018  . Degenerative arthritis of right knee 10/23/2017  . Left leg cellulitis 08/12/2017  . Arthritis of right knee 05/20/2017  . Diverticulosis of colon with hemorrhage   . Acute blood loss anemia   . GIB (gastrointestinal bleeding) 03/12/2017  . Leg edema, right 02/20/2015  . BPH (benign prostatic hyperplasia) 12/09/2014  . Chronic radicular low back  pain 08/06/2012  . COPD (chronic obstructive pulmonary disease) ([place]) 04/21/2012  . Syncope 03/29/2012  . Hyponatremia 03/29/2012  . Localized osteoarthrosis not specified whether primary or secondary, lower leg 06/20/2010  . VARICOSE VEINS LOWER EXTREMITIES W/INFLAMMATION 02/12/2010  . [place] LOCALIZED OSTEOARTHROSIS INVOLVING HAND 10/16/2009  . DUPUYTREN'S CONTRACTURE, RIGHT 07/04/2009  . DEGENERATIVE DISC DISEASE, LUMBOSACRAL SPINE W/RADICULOPATHY 06/19/2009  . CARCINOMA, BASAL CELL 02/07/2009  . ANEMIA OF CHRONIC DISEASE 02/07/2009  . BURSITIS, RIGHT SHOULDER 10/07/2008  . CATARACT ASSOCIATED WITH OTHER SYNDROMES 08/01/2008  . DRY SKIN 08/01/2008  . CERUMEN IMPACTION, BILATERAL 03/01/2008  . PMR (polymyalgia rheumatica) ([place]) 01/18/2008  . CRAMP IN LIMB 01/18/2008  . DEPRESSION, SITUATIONAL, ACUTE 12/28/2007  . CONSTIPATION, DRUG INDUCED 12/28/2007  . POLYNEUROPATHY OTHER DISEASES CLASSIFIED ELSW 09/24/2007  .  WEIGHT LOSS, ABNORMAL 01/07/2007  . Hyperlipidemia 09/22/2006  . Essential hypertension 09/22/2006  . GERD (gastroesophageal reflux disease) 09/22/2006  . Osteoarthritis 09/22/2006   Ruben Im, PT 10/27/18 7:03 PM Phone: [phone] Fax: [phone] Alvera Singh 10/27/2018, 7:03 PM  [facility] [address], [place], [place], [address] Phone: [phone]   Fax:  [phone]  Name: Darin [name] MRN: [id number] Date of Birth: Sep 14, 1931

## 2018-10-29 ENCOUNTER — Ambulatory Visit: Payer: Medicare Other | Admitting: Physical Therapy

## 2018-10-29 ENCOUNTER — Encounter: Payer: Self-pay | Admitting: Physical Therapy

## 2018-10-29 ENCOUNTER — Other Ambulatory Visit: Payer: Self-pay

## 2018-10-29 DIAGNOSIS — G8929 Other chronic pain: Secondary | ICD-10-CM | POA: Diagnosis not present

## 2018-10-29 DIAGNOSIS — M25561 Pain in right knee: Secondary | ICD-10-CM | POA: Diagnosis not present

## 2018-10-29 DIAGNOSIS — M6281 Muscle weakness (generalized): Secondary | ICD-10-CM

## 2018-10-29 DIAGNOSIS — R296 Repeated falls: Secondary | ICD-10-CM | POA: Diagnosis not present

## 2018-10-29 DIAGNOSIS — M25661 Stiffness of right knee, not elsewhere classified: Secondary | ICD-10-CM

## 2018-10-29 DIAGNOSIS — R279 Unspecified lack of coordination: Secondary | ICD-10-CM | POA: Diagnosis not present

## 2018-10-29 NOTE — Therapy (Signed)
[facility] [address] [place], [place], [address] Phone: [phone]   Fax:  [phone]  Physical Therapy Treatment  Patient Details  Name: Darin [name] MRN: [id number] Date of Birth: 1931/09/02 Referring Provider (PT): Dr. Hulan Saas    Encounter Date: 10/29/2018  PT End of Session - 10/29/18 0741    Visit Number  41    Date for PT Re-Evaluation  12/08/18    Authorization Type  Medicare     PT Start Time  0730    PT Stop Time  0810    PT Time Calculation (min)  40 min    Activity Tolerance  Patient tolerated treatment well       Past Medical History:  Diagnosis Date  . Arthritis   . GERD (gastroesophageal reflux disease)   . H/O hiatal hernia    "FOR YEARS" " NO PROBLEM"  . Hyperlipidemia   . Hypertension   . Knee pain   . Neuropathy    PERIPHERAL   . Osteoporosis   . PMR (polymyalgia rheumatica) (HCC)   . Polymyalgia ([place]) 5 YEARS    Past Surgical History:  Procedure Laterality Date  . CARPAL TUNNEL RELEASE    . CATARACT EXTRACTION     right  . COLONOSCOPY  2007  . INGUINAL HERNIA REPAIR  10/14/2011   Procedure: HERNIA REPAIR INGUINAL ADULT;  Surgeon: [name], MD;  Location: [place];  Service: General;  Laterality: Right;  Right Inguinal Hernia Repair with Mesh  . TONSILLECTOMY    . VARICOSE VEIN SURGERY      There were no vitals filed for this visit.  Subjective Assessment - 10/29/18 0728    Subjective  Feeling pretty good.  Reports some fatigue immediately following last treatment session.  "I wouldn't want to repeat it right away."  The shot has done some good.  It's tolerable.  Volunteering at the hospital on Thursday afternoons.    Patient Stated Goals  I would  be able to walk with a cane but I'm not sure that's going to happen     Currently in Pain?  Yes    Pain Score  4     Pain Location  Hip    Pain Orientation  Right    Pain Type  Chronic pain    Pain Score  0    Pain Location  Knee    Pain Orientation  Right    Aggravating Factors   give away with fatigue                       [facility] Adult PT Treatment/Exercise - 10/29/18 0001      Ambulation/Gait   Gait Comments  ladder walk 4 laps       Knee/Hip Exercises: Stretches   Active Hamstring Stretch  Right;Left;5 reps    Active Hamstring Stretch Limitations  on 2nd step     Hip Flexor Stretch Limitations  2nd step hip flexor stretch 10x right/left     Other Knee/Hip Stretches  slant board stretch 30 sec right/left       Knee/Hip Exercises: Aerobic   Nustep  seat 13 arms 11 L3 10 min     PT present to discuss status     Knee/Hip Exercises: Machines for Strengthening   Cybex Leg Press  Seat 9: #110 2x50 reps BLE (pt only completing through partial range); single leg #45 x50 reps each (pt completed through greater range with  this)       Knee/Hip Exercises: Standing   Forward Step Up  Right;Left;5 reps;Hand Hold: 2;Step Height: 6"    Other Standing Knee Exercises  alternating step taps 10x     Other Standing Knee Exercises  stepping over cane on the floor forward and laterally 8x each       Knee/Hip Exercises: Seated   Other Seated Knee/Hip Exercises  seated "sit ups"    Other Seated Knee/Hip Exercises  foam roll push down 15x     Hamstring Curl  Strengthening;Right;Left;20 reps    Hamstring Limitations  green band               PT Short Term Goals - 10/27/18 2951      PT SHORT TERM GOAL #1   Title  be independent in initial HEP      Status  Achieved      PT SHORT TERM GOAL #2   Title  The patient will have improved right knee ROM 3-120 degrees needed for greater ease getting up and down from the chair and in/out of the car      PT SHORT TERM GOAL #3   Title  The patient will have improved hip flexor muscle length to 5 degrees and HS length to 65 degrees needed for improved stride length with ambulation    Status  Achieved      PT SHORT TERM GOAL #4   Title   Timed up and Go to 16 sec indicating improved gait safety      PT SHORT TERM GOAL #5   Title  Ten meter walk test to 14 sec indicate improved gait speed needed for community ambulation         PT Long Term Goals - 10/27/18 0849      PT LONG TERM GOAL #1   Title  be independent in advanced HEP and return to gym program to prevent declines in strength, ROM and balance    Status  On-going      PT LONG TERM GOAL #2   Title  The patient will be able to walk to the mailbox and to the curb to get the newspaper 150 feet each way with knee and hip pain 5/10 or less    Status  Achieved      PT LONG TERM GOAL #3   Title  Improved bil hip abduction, extension and right knee extension strength to 4/5 needed for greater ease with sit to stand and walking/standing    Time  6    Period  Weeks    Status  On-going      PT LONG TERM GOAL #4   Title  The patient will be able to walk 10 m in < 17 sec without looking at the ground    Time  6    Period  Weeks    Status  Revised      PT LONG TERM GOAL #5   Title  The patient will be able to ambulate 370 feet in six minutes with RW needed for community ambulation    Time  8    Period  Weeks    Status  Revised      PT LONG TERM GOAL #6   Title  Timed up and go test of 19 sec or less indicating improved LE strength and gait speed    Time  6    Period  Weeks    Status  New  Plan - 10/29/18 0742    Clinical Impression Statement  Seated ex's between standing ex to avoid over fatigue.   Short, stuttering steps especially with gait initiation but notably improved with floor ladder agility with stepping over the "rungs".  He is able to increase resistance with leg press strengthening.  The patient is fearful of knee instability with right step ups but tolerates left step ups well.  Therapist closely monitoring fatigue level, pain intensity and providing close supervision for safety while standing.    Personal Factors and Comorbidities   Age;Past/Current Experience;Comorbidity 2;Comorbidity 3+;Time since onset of injury/illness/exacerbation    Rehab Potential  Good    Clinical Impairments Affecting Rehab Potential  risk for falls    PT Frequency  2x / week    PT Duration  6 weeks    PT Treatment/Interventions  ADLs/Self Care Home Management;Cryotherapy;Electrical Stimulation;Ultrasound;Moist Heat;Iontophoresis 4mg /ml Dexamethasone;Gait training;Stair training;Therapeutic activities;Therapeutic exercise;Patient/family education;Neuromuscular re-education;Manual techniques;Taping;Dry needling    PT Next Visit Plan  KX; walk laps every visit with RW  gait endurance;  LE strengthening progression    PT Home Exercise Plan  Access Code: [email or web address]        Patient will benefit from skilled therapeutic intervention in order to improve the following deficits and impairments:  Pain, Decreased range of motion, Decreased strength, Decreased activity tolerance, Impaired perceived functional ability, Difficulty walking, Decreased balance  Visit Diagnosis: 1. Stiffness of right knee, not elsewhere classified   2. Muscle weakness (generalized)   3. Chronic pain of right knee        Problem List Patient Active Problem List   Diagnosis Date Noted  . Strain of left piriformis muscle 10/05/2018  . Pain due to onychomycosis of toenails of both feet 09/23/2018  . Chronic left SI joint pain 04/27/2018  . Right wrist pain 02/05/2018  . Degenerative arthritis of right knee 10/23/2017  . Left leg cellulitis 08/12/2017  . Arthritis of right knee 05/20/2017  . Diverticulosis of colon with hemorrhage   . Acute blood loss anemia   . GIB (gastrointestinal bleeding) 03/12/2017  . Leg edema, right 02/20/2015  . BPH (benign prostatic hyperplasia) 12/09/2014  . Chronic radicular low back pain 08/06/2012  . COPD (chronic obstructive pulmonary disease) ([place]) 04/21/2012  . Syncope 03/29/2012  . Hyponatremia 03/29/2012  . Localized osteoarthrosis not  specified whether primary or secondary, lower leg 06/20/2010  . VARICOSE VEINS LOWER EXTREMITIES W/INFLAMMATION 02/12/2010  . [place] LOCALIZED OSTEOARTHROSIS INVOLVING HAND 10/16/2009  . DUPUYTREN'S CONTRACTURE, RIGHT 07/04/2009  . DEGENERATIVE DISC DISEASE, LUMBOSACRAL SPINE W/RADICULOPATHY 06/19/2009  . CARCINOMA, BASAL CELL 02/07/2009  . ANEMIA OF CHRONIC DISEASE 02/07/2009  . BURSITIS, RIGHT SHOULDER 10/07/2008  . CATARACT ASSOCIATED WITH OTHER SYNDROMES 08/01/2008  . DRY SKIN 08/01/2008  . CERUMEN IMPACTION, BILATERAL 03/01/2008  . PMR (polymyalgia rheumatica) ([place]) 01/18/2008  . CRAMP IN LIMB 01/18/2008  . DEPRESSION, SITUATIONAL, ACUTE 12/28/2007  . CONSTIPATION, DRUG INDUCED 12/28/2007  . POLYNEUROPATHY OTHER DISEASES CLASSIFIED ELSW 09/24/2007  . WEIGHT LOSS, ABNORMAL 01/07/2007  . Hyperlipidemia 09/22/2006  . Essential hypertension 09/22/2006  . GERD (gastroesophageal reflux disease) 09/22/2006  . Osteoarthritis 09/22/2006   Ruben Im, PT 10/29/18 9:54 AM Phone: [phone] Fax: [phone] Alvera Singh 10/29/2018, 9:53 AM  [facility] [address] [place], [place], [address] Phone: [phone]   Fax:  [phone]  Name: Darin [name] MRN: [id number] Date of Birth: 07-07-1931

## 2018-11-04 ENCOUNTER — Encounter: Payer: Self-pay | Admitting: Physical Therapy

## 2018-11-04 ENCOUNTER — Other Ambulatory Visit: Payer: Self-pay

## 2018-11-04 ENCOUNTER — Ambulatory Visit: Payer: Medicare Other | Admitting: Physical Therapy

## 2018-11-04 ENCOUNTER — Ambulatory Visit: Payer: Medicare Other

## 2018-11-04 DIAGNOSIS — M6281 Muscle weakness (generalized): Secondary | ICD-10-CM | POA: Diagnosis not present

## 2018-11-04 DIAGNOSIS — R279 Unspecified lack of coordination: Secondary | ICD-10-CM | POA: Diagnosis not present

## 2018-11-04 DIAGNOSIS — G8929 Other chronic pain: Secondary | ICD-10-CM

## 2018-11-04 DIAGNOSIS — M25561 Pain in right knee: Secondary | ICD-10-CM | POA: Diagnosis not present

## 2018-11-04 DIAGNOSIS — M25661 Stiffness of right knee, not elsewhere classified: Secondary | ICD-10-CM

## 2018-11-04 DIAGNOSIS — R296 Repeated falls: Secondary | ICD-10-CM | POA: Diagnosis not present

## 2018-11-04 NOTE — Therapy (Signed)
[facility] [address], [place], [place], [address] Phone: [phone]   Fax:  [phone]  Physical Therapy Treatment  Patient Details  Name: Darin [name] MRN: [id number] Date of Birth: 05/01/1931 Referring Provider (PT): Dr. Hulan Saas    Encounter Date: 11/04/2018  PT End of Session - 11/04/18 0848    Visit Number  45    Date for PT Re-Evaluation  12/08/18    Authorization Type  Medicare     PT Start Time  0846    PT Stop Time  0930    PT Time Calculation (min)  44 min    Activity Tolerance  Patient tolerated treatment well    Behavior During Therapy  [facility] for tasks assessed/performed       Past Medical History:  Diagnosis Date  . Arthritis   . GERD (gastroesophageal reflux disease)   . H/O hiatal hernia    "FOR YEARS" " NO PROBLEM"  . Hyperlipidemia   . Hypertension   . Knee pain   . Neuropathy    PERIPHERAL   . Osteoporosis   . PMR (polymyalgia rheumatica) (HCC)   . Polymyalgia ([place]) 5 YEARS    Past Surgical History:  Procedure Laterality Date  . CARPAL TUNNEL RELEASE    . CATARACT EXTRACTION     right  . COLONOSCOPY  2007  . INGUINAL HERNIA REPAIR  10/14/2011   Procedure: HERNIA REPAIR INGUINAL ADULT;  Surgeon: [name], MD;  Location: [place];  Service: General;  Laterality: Right;  Right Inguinal Hernia Repair with Mesh  . TONSILLECTOMY    . VARICOSE VEIN SURGERY      There were no vitals filed for this visit.  Subjective Assessment - 11/04/18 0850    Subjective  Feeling a little more sore today.  Felt good after last session.    Currently in Pain?  Yes    Pain Score  6     Pain Location  Knee    Pain Orientation  Right    Pain Descriptors / Indicators  Aching    Pain Type  Chronic pain    Pain Onset  More than a month ago    Pain Frequency  Constant    Multiple Pain Sites  No                       [facility] Adult PT Treatment/Exercise - 11/04/18 0001      Knee/Hip Exercises: Aerobic   Nustep  seat 13 arms 11; L3 x10 min     PT present to discuss status     Knee/Hip Exercises: Machines for Strengthening   Cybex Leg Press  Seat 9: #110 2x50 reps BLE (pt only completing through partial range); single leg #45 x50 reps each (pt completed through greater range with this)       Knee/Hip Exercises: Standing   Forward Step Up  Right;Left;Hand Hold: 2;Step Height: 6";10 reps    Gait Training  staggered stand with UE reaching 2x 10 right/left     Other Standing Knee Exercises  alternating step taps 10x       Knee/Hip Exercises: Seated   Clamshell with TheraBand  Blue   30x   Other Seated Knee/Hip Exercises  seated "sit ups"    Other Seated Knee/Hip Exercises  UE extension with band positioned overhead and slightly behind and cues to activate abdominals - PT standing behind holding the band - 20x green band; 30x blue  band               PT Short Term Goals - 10/27/18 1497      PT SHORT TERM GOAL #1   Title  be independent in initial HEP      Status  Achieved      PT SHORT TERM GOAL #2   Title  The patient will have improved right knee ROM 3-120 degrees needed for greater ease getting up and down from the chair and in/out of the car      PT SHORT TERM GOAL #3   Title  The patient will have improved hip flexor muscle length to 5 degrees and HS length to 65 degrees needed for improved stride length with ambulation    Status  Achieved      PT SHORT TERM GOAL #4   Title  Timed up and Go to 16 sec indicating improved gait safety      PT SHORT TERM GOAL #5   Title  Ten meter walk test to 14 sec indicate improved gait speed needed for community ambulation         PT Long Term Goals - 10/27/18 0849      PT LONG TERM GOAL #1   Title  be independent in advanced HEP and return to gym program to prevent declines in strength, ROM and balance    Status  On-going      PT LONG TERM GOAL #2   Title  The patient will be able to walk to the  mailbox and to the curb to get the newspaper 150 feet each way with knee and hip pain 5/10 or less    Status  Achieved      PT LONG TERM GOAL #3   Title  Improved bil hip abduction, extension and right knee extension strength to 4/5 needed for greater ease with sit to stand and walking/standing    Time  6    Period  Weeks    Status  On-going      PT LONG TERM GOAL #4   Title  The patient will be able to walk 10 m in < 17 sec without looking at the ground    Time  6    Period  Weeks    Status  Revised      PT LONG TERM GOAL #5   Title  The patient will be able to ambulate 370 feet in six minutes with RW needed for community ambulation    Time  8    Period  Weeks    Status  Revised      PT LONG TERM GOAL #6   Title  Timed up and go test of 19 sec or less indicating improved LE strength and gait speed    Time  6    Period  Weeks    Status  New            Plan - 11/04/18 0263    Clinical Impression Statement  Pt needed to switch from standing to sitting after every 1-2 activities.  He continues to have short step length with poor foot clearance Rt>Lt.  Pt was able to tolerate exercises well in spite of being a little more sore today.  Pt was able to do 10 step ups on 6" step bilaterally.  He reported feeling dizzy 1x during the session but resolved after sitting for 1-2 minutes.  Pt will benefit from skilled PT to continue working on improved  strength, balance and endurance for safely ambulating around his home and community.    PT Treatment/Interventions  ADLs/Self Care Home Management;Cryotherapy;Electrical Stimulation;Ultrasound;Moist Heat;Iontophoresis 4mg /ml Dexamethasone;Gait training;Stair training;Therapeutic activities;Therapeutic exercise;Patient/family education;Neuromuscular re-education;Manual techniques;Taping;Dry needling    PT Next Visit Plan  KX; walk laps every visit with RW  gait endurance;  LE strengthening progression       Patient will benefit from skilled  therapeutic intervention in order to improve the following deficits and impairments:  Pain, Decreased range of motion, Decreased strength, Decreased activity tolerance, Impaired perceived functional ability, Difficulty walking, Decreased balance  Visit Diagnosis: 1. Stiffness of right knee, not elsewhere classified   2. Muscle weakness (generalized)   3. Chronic pain of right knee        Problem List Patient Active Problem List   Diagnosis Date Noted  . Strain of left piriformis muscle 10/05/2018  . Pain due to onychomycosis of toenails of both feet 09/23/2018  . Chronic left SI joint pain 04/27/2018  . Right wrist pain 02/05/2018  . Degenerative arthritis of right knee 10/23/2017  . Left leg cellulitis 08/12/2017  . Arthritis of right knee 05/20/2017  . Diverticulosis of colon with hemorrhage   . Acute blood loss anemia   . GIB (gastrointestinal bleeding) 03/12/2017  . Leg edema, right 02/20/2015  . BPH (benign prostatic hyperplasia) 12/09/2014  . Chronic radicular low back pain 08/06/2012  . COPD (chronic obstructive pulmonary disease) ([place]) 04/21/2012  . Syncope 03/29/2012  . Hyponatremia 03/29/2012  . Localized osteoarthrosis not specified whether primary or secondary, lower leg 06/20/2010  . VARICOSE VEINS LOWER EXTREMITIES W/INFLAMMATION 02/12/2010  . [place] LOCALIZED OSTEOARTHROSIS INVOLVING HAND 10/16/2009  . DUPUYTREN'S CONTRACTURE, RIGHT 07/04/2009  . DEGENERATIVE DISC DISEASE, LUMBOSACRAL SPINE W/RADICULOPATHY 06/19/2009  . CARCINOMA, BASAL CELL 02/07/2009  . ANEMIA OF CHRONIC DISEASE 02/07/2009  . BURSITIS, RIGHT SHOULDER 10/07/2008  . CATARACT ASSOCIATED WITH OTHER SYNDROMES 08/01/2008  . DRY SKIN 08/01/2008  . CERUMEN IMPACTION, BILATERAL 03/01/2008  . PMR (polymyalgia rheumatica) ([place]) 01/18/2008  . CRAMP IN LIMB 01/18/2008  . DEPRESSION, SITUATIONAL, ACUTE 12/28/2007  . CONSTIPATION, DRUG INDUCED 12/28/2007  . POLYNEUROPATHY OTHER DISEASES CLASSIFIED ELSW  09/24/2007  . WEIGHT LOSS, ABNORMAL 01/07/2007  . Hyperlipidemia 09/22/2006  . Essential hypertension 09/22/2006  . GERD (gastroesophageal reflux disease) 09/22/2006  . Osteoarthritis 09/22/2006    Jule Ser, PT 11/04/2018, 9:28 AM  [facility] [address], [place], [place], [address] Phone: [phone]   Fax:  [phone]  Name: Darin [name] MRN: [id number] Date of Birth: [birth year]

## 2018-11-06 ENCOUNTER — Ambulatory Visit: Payer: Medicare Other | Admitting: Physical Therapy

## 2018-11-06 ENCOUNTER — Other Ambulatory Visit: Payer: Self-pay

## 2018-11-06 ENCOUNTER — Encounter: Payer: Self-pay | Admitting: Physical Therapy

## 2018-11-06 DIAGNOSIS — M6281 Muscle weakness (generalized): Secondary | ICD-10-CM | POA: Diagnosis not present

## 2018-11-06 DIAGNOSIS — M25561 Pain in right knee: Secondary | ICD-10-CM | POA: Diagnosis not present

## 2018-11-06 DIAGNOSIS — G8929 Other chronic pain: Secondary | ICD-10-CM | POA: Diagnosis not present

## 2018-11-06 DIAGNOSIS — R296 Repeated falls: Secondary | ICD-10-CM | POA: Diagnosis not present

## 2018-11-06 DIAGNOSIS — R279 Unspecified lack of coordination: Secondary | ICD-10-CM

## 2018-11-06 DIAGNOSIS — M25661 Stiffness of right knee, not elsewhere classified: Secondary | ICD-10-CM | POA: Diagnosis not present

## 2018-11-06 DIAGNOSIS — R262 Difficulty in walking, not elsewhere classified: Secondary | ICD-10-CM

## 2018-11-06 DIAGNOSIS — M25552 Pain in left hip: Secondary | ICD-10-CM

## 2018-11-06 NOTE — Therapy (Signed)
[facility] [address] [place], [place], [address] Phone: [phone]   Fax:  [phone]  Physical Therapy Treatment  Patient Details  Name: Darin [name] MRN: [id number] Date of Birth: 01/10/1932 Referring Provider (PT): Dr. Hulan Saas    Encounter Date: 11/06/2018  PT End of Session - 11/06/18 0845    Visit Number  72    Date for PT Re-Evaluation  12/08/18    Authorization Type  Medicare     PT Start Time  0840    PT Stop Time  0921    PT Time Calculation (min)  41 min    Activity Tolerance  Patient tolerated treatment well    Behavior During Therapy  [facility] for tasks assessed/performed       Past Medical History:  Diagnosis Date  . Arthritis   . GERD (gastroesophageal reflux disease)   . H/O hiatal hernia    "FOR YEARS" " NO PROBLEM"  . Hyperlipidemia   . Hypertension   . Knee pain   . Neuropathy    PERIPHERAL   . Osteoporosis   . PMR (polymyalgia rheumatica) (HCC)   . Polymyalgia ([place]) 5 YEARS    Past Surgical History:  Procedure Laterality Date  . CARPAL TUNNEL RELEASE    . CATARACT EXTRACTION     right  . COLONOSCOPY  2007  . INGUINAL HERNIA REPAIR  10/14/2011   Procedure: HERNIA REPAIR INGUINAL ADULT;  Surgeon: [name], MD;  Location: [place];  Service: General;  Laterality: Right;  Right Inguinal Hernia Repair with Mesh  . TONSILLECTOMY    . VARICOSE VEIN SURGERY      There were no vitals filed for this visit.  Subjective Assessment - 11/06/18 0846    Subjective  "I am here." My knee is misbahaving this AM.    Currently in Pain?  Yes    Pain Score  7     Pain Location  Knee    Pain Orientation  Right    Aggravating Factors   nothing specific    Pain Relieving Factors  nothing, my injections sometimes    Multiple Pain Sites  No                       [facility] Adult PT Treatment/Exercise - 11/06/18 0001      Knee/Hip Exercises: Aerobic   Nustep  seat 13  arms 11; L3 x10 min     Darin [name] present to discuss status     Knee/Hip Exercises: Machines for Strengthening   Cybex Leg Press  Seat 9: #110 2x50 reps BLE (pt only completing through partial range); single leg #45 x50 reps each (pt completed through greater range with this)       Knee/Hip Exercises: Standing   Other Standing Knee Exercises  walking in clinic with RW;    6 laps     Knee/Hip Exercises: Seated   Other Seated Knee/Hip Exercises  Seated chest press with blue band 50x,     Other Seated Knee/Hip Exercises  Blue band shld extensions blue band 30x                PT Short Term Goals - 10/27/18 7858      PT SHORT TERM GOAL #1   Title  be independent in initial HEP      Status  Achieved      PT SHORT TERM GOAL #2   Title  The patient will  have improved right knee ROM 3-120 degrees needed for greater ease getting up and down from the chair and in/out of the car      PT SHORT TERM GOAL #3   Title  The patient will have improved hip flexor muscle length to 5 degrees and HS length to 65 degrees needed for improved stride length with ambulation    Status  Achieved      PT SHORT TERM GOAL #4   Title  Timed up and Go to 16 sec indicating improved gait safety      PT SHORT TERM GOAL #5   Title  Ten meter walk test to 14 sec indicate improved gait speed needed for community ambulation         PT Long Term Goals - 10/27/18 0849      PT LONG TERM GOAL #1   Title  be independent in advanced HEP and return to gym program to prevent declines in strength, ROM and balance    Status  On-going      PT LONG TERM GOAL #2   Title  The patient will be able to walk to the mailbox and to the curb to get the newspaper 150 feet each way with knee and hip pain 5/10 or less    Status  Achieved      PT LONG TERM GOAL #3   Title  Improved bil hip abduction, extension and right knee extension strength to 4/5 needed for greater ease with sit to stand and walking/standing    Time  6     Period  Weeks    Status  On-going      PT LONG TERM GOAL #4   Title  The patient will be able to walk 10 m in < 17 sec without looking at the ground    Time  6    Period  Weeks    Status  Revised      PT LONG TERM GOAL #5   Title  The patient will be able to ambulate 370 feet in six minutes with RW needed for community ambulation    Time  8    Period  Weeks    Status  Revised      PT LONG TERM GOAL #6   Title  Timed up and go test of 19 sec or less indicating improved LE strength and gait speed    Time  6    Period  Weeks    Status  New            Plan - 11/06/18 0854    Clinical Impression Statement  Pt appears to be taking smaller steps than usual this AM. May be due to his knee hurting a little more. He was still able to complete this exercises without extra pain. Pt was able to walk 6  laps today around the clinic.    Personal Factors and Comorbidities  Age;Past/Current Experience;Comorbidity 2;Comorbidity 3+;Time since onset of injury/illness/exacerbation    Examination-Activity Limitations  Locomotion Level    Rehab Potential  Good    Clinical Impairments Affecting Rehab Potential  risk for falls    PT Frequency  2x / week    PT Duration  6 weeks    PT Treatment/Interventions  ADLs/Self Care Home Management;Cryotherapy;Electrical Stimulation;Ultrasound;Moist Heat;Iontophoresis 4mg /ml Dexamethasone;Gait training;Stair training;Therapeutic activities;Therapeutic exercise;Patient/family education;Neuromuscular re-education;Manual techniques;Taping;Dry needling    PT Next Visit Plan  KX; walk laps every visit with RW  gait endurance;  LE strengthening progression  PT Home Exercise Plan  Access Code: [email or web address]     Consulted and Agree with Plan of Care  Patient       Patient will benefit from skilled therapeutic intervention in order to improve the following deficits and impairments:  Pain, Decreased range of motion, Decreased strength, Decreased activity tolerance,  Impaired perceived functional ability, Difficulty walking, Decreased balance  Visit Diagnosis: 1. Stiffness of right knee, not elsewhere classified   2. Muscle weakness (generalized)   3. Chronic pain of right knee   4. Repeated falls   5. Unspecified lack of coordination   6. Difficulty in walking, not elsewhere classified   7. Pain in left hip        Problem List Patient Active Problem List   Diagnosis Date Noted  . Strain of left piriformis muscle 10/05/2018  . Pain due to onychomycosis of toenails of both feet 09/23/2018  . Chronic left SI joint pain 04/27/2018  . Right wrist pain 02/05/2018  . Degenerative arthritis of right knee 10/23/2017  . Left leg cellulitis 08/12/2017  . Arthritis of right knee 05/20/2017  . Diverticulosis of colon with hemorrhage   . Acute blood loss anemia   . GIB (gastrointestinal bleeding) 03/12/2017  . Leg edema, right 02/20/2015  . BPH (benign prostatic hyperplasia) 12/09/2014  . Chronic radicular low back pain 08/06/2012  . COPD (chronic obstructive pulmonary disease) ([place]) 04/21/2012  . Syncope 03/29/2012  . Hyponatremia 03/29/2012  . Localized osteoarthrosis not specified whether primary or secondary, lower leg 06/20/2010  . VARICOSE VEINS LOWER EXTREMITIES W/INFLAMMATION 02/12/2010  . [place] LOCALIZED OSTEOARTHROSIS INVOLVING HAND 10/16/2009  . DUPUYTREN'S CONTRACTURE, RIGHT 07/04/2009  . DEGENERATIVE DISC DISEASE, LUMBOSACRAL SPINE W/RADICULOPATHY 06/19/2009  . CARCINOMA, BASAL CELL 02/07/2009  . ANEMIA OF CHRONIC DISEASE 02/07/2009  . BURSITIS, RIGHT SHOULDER 10/07/2008  . CATARACT ASSOCIATED WITH OTHER SYNDROMES 08/01/2008  . DRY SKIN 08/01/2008  . CERUMEN IMPACTION, BILATERAL 03/01/2008  . PMR (polymyalgia rheumatica) ([place]) 01/18/2008  . CRAMP IN LIMB 01/18/2008  . DEPRESSION, SITUATIONAL, ACUTE 12/28/2007  . CONSTIPATION, DRUG INDUCED 12/28/2007  . POLYNEUROPATHY OTHER DISEASES CLASSIFIED ELSW 09/24/2007  . WEIGHT LOSS,  ABNORMAL 01/07/2007  . Hyperlipidemia 09/22/2006  . Essential hypertension 09/22/2006  . GERD (gastroesophageal reflux disease) 09/22/2006  . Osteoarthritis 09/22/2006    Darin [name], Darin [name] 11/06/2018, 9:22 AM  [facility] [address], [place], [place], [address] Phone: [phone]   Fax:  [phone]  Name: Darin [name] MRN: [id number] Date of Birth: 1932/01/20

## 2018-11-06 NOTE — Therapy (Signed)
[facility] [address], [place], [place], [address] Phone: [phone]   Fax:  [phone]  Physical Therapy Treatment  Patient Details  Name: Darin [name] MRN: [id number] Date of Birth: 08/11/1931 Referring Provider (PT): Dr. Hulan Saas    Encounter Date: 11/06/2018  PT End of Session - 11/06/18 0845    Visit Number  48    Date for PT Re-Evaluation  12/08/18    Authorization Type  Medicare     PT Start Time  0840    PT Stop Time  0921    PT Time Calculation (min)  41 min    Activity Tolerance  Patient tolerated treatment well    Behavior During Therapy  [facility] for tasks assessed/performed       Past Medical History:  Diagnosis Date  . Arthritis   . GERD (gastroesophageal reflux disease)   . H/O hiatal hernia    "FOR YEARS" " NO PROBLEM"  . Hyperlipidemia   . Hypertension   . Knee pain   . Neuropathy    PERIPHERAL   . Osteoporosis   . PMR (polymyalgia rheumatica) (HCC)   . Polymyalgia ([place]) 5 YEARS    Past Surgical History:  Procedure Laterality Date  . CARPAL TUNNEL RELEASE    . CATARACT EXTRACTION     right  . COLONOSCOPY  2007  . INGUINAL HERNIA REPAIR  10/14/2011   Procedure: HERNIA REPAIR INGUINAL ADULT;  Surgeon: [name], MD;  Location: [place];  Service: General;  Laterality: Right;  Right Inguinal Hernia Repair with Mesh  . TONSILLECTOMY    . VARICOSE VEIN SURGERY      There were no vitals filed for this visit.  Subjective Assessment - 11/06/18 0846    Subjective  "I am here." My knee is misbahaving this AM.    Currently in Pain?  Yes    Pain Score  7     Pain Location  Knee    Pain Orientation  Right    Aggravating Factors   nothing specific    Pain Relieving Factors  nothing, my injections sometimes    Multiple Pain Sites  No                       [facility] Adult PT Treatment/Exercise - 11/06/18 0001      Knee/Hip Exercises: Aerobic   Nustep  seat 13  arms 11; L3 x10 min     PTA present to discuss status     Knee/Hip Exercises: Machines for Strengthening   Cybex Leg Press  Seat 9: #110 2x50 reps BLE (pt only completing through partial range); single leg #45 x50 reps each (pt completed through greater range with this)       Knee/Hip Exercises: Standing   Other Standing Knee Exercises  walking in clinic with RW;    6 laps     Knee/Hip Exercises: Seated   Other Seated Knee/Hip Exercises  Seated chest press with blue band 50x,     Other Seated Knee/Hip Exercises  Blue band shld extensions blue band 30x                PT Short Term Goals - 10/27/18 3785      PT SHORT TERM GOAL #1   Title  be independent in initial HEP      Status  Achieved      PT SHORT TERM GOAL #2   Title  The patient will  have improved right knee ROM 3-120 degrees needed for greater ease getting up and down from the chair and in/out of the car      PT SHORT TERM GOAL #3   Title  The patient will have improved hip flexor muscle length to 5 degrees and HS length to 65 degrees needed for improved stride length with ambulation    Status  Achieved      PT SHORT TERM GOAL #4   Title  Timed up and Go to 16 sec indicating improved gait safety      PT SHORT TERM GOAL #5   Title  Ten meter walk test to 14 sec indicate improved gait speed needed for community ambulation         PT Long Term Goals - 10/27/18 0849      PT LONG TERM GOAL #1   Title  be independent in advanced HEP and return to gym program to prevent declines in strength, ROM and balance    Status  On-going      PT LONG TERM GOAL #2   Title  The patient will be able to walk to the mailbox and to the curb to get the newspaper 150 feet each way with knee and hip pain 5/10 or less    Status  Achieved      PT LONG TERM GOAL #3   Title  Improved bil hip abduction, extension and right knee extension strength to 4/5 needed for greater ease with sit to stand and walking/standing    Time  6     Period  Weeks    Status  On-going      PT LONG TERM GOAL #4   Title  The patient will be able to walk 10 m in < 17 sec without looking at the ground    Time  6    Period  Weeks    Status  Revised      PT LONG TERM GOAL #5   Title  The patient will be able to ambulate 370 feet in six minutes with RW needed for community ambulation    Time  8    Period  Weeks    Status  Revised      PT LONG TERM GOAL #6   Title  Timed up and go test of 19 sec or less indicating improved LE strength and gait speed    Time  6    Period  Weeks    Status  New            Plan - 11/06/18 0854    Clinical Impression Statement  Pt appears to be taking smaller steps than usual this AM. May be due to his knee hurting a little more. He was still able to complete this exercises without extra pain. Pt was able to walk 6  laps today around the clinic.    Personal Factors and Comorbidities  Age;Past/Current Experience;Comorbidity 2;Comorbidity 3+;Time since onset of injury/illness/exacerbation    Examination-Activity Limitations  Locomotion Level    Rehab Potential  Good    Clinical Impairments Affecting Rehab Potential  risk for falls    PT Frequency  2x / week    PT Duration  6 weeks    PT Treatment/Interventions  ADLs/Self Care Home Management;Cryotherapy;Electrical Stimulation;Ultrasound;Moist Heat;Iontophoresis 4mg /ml Dexamethasone;Gait training;Stair training;Therapeutic activities;Therapeutic exercise;Patient/family education;Neuromuscular re-education;Manual techniques;Taping;Dry needling    PT Next Visit Plan  KX; walk laps every visit with RW  gait endurance;  LE strengthening progression  PT Home Exercise Plan  Access Code: [email or web address]     Consulted and Agree with Plan of Care  Patient       Patient will benefit from skilled therapeutic intervention in order to improve the following deficits and impairments:  Pain, Decreased range of motion, Decreased strength, Decreased activity tolerance,  Impaired perceived functional ability, Difficulty walking, Decreased balance  Visit Diagnosis: 1. Stiffness of right knee, not elsewhere classified   2. Muscle weakness (generalized)   3. Chronic pain of right knee   4. Repeated falls   5. Unspecified lack of coordination   6. Difficulty in walking, not elsewhere classified   7. Pain in left hip        Problem List Patient Active Problem List   Diagnosis Date Noted  . Strain of left piriformis muscle 10/05/2018  . Pain due to onychomycosis of toenails of both feet 09/23/2018  . Chronic left SI joint pain 04/27/2018  . Right wrist pain 02/05/2018  . Degenerative arthritis of right knee 10/23/2017  . Left leg cellulitis 08/12/2017  . Arthritis of right knee 05/20/2017  . Diverticulosis of colon with hemorrhage   . Acute blood loss anemia   . GIB (gastrointestinal bleeding) 03/12/2017  . Leg edema, right 02/20/2015  . BPH (benign prostatic hyperplasia) 12/09/2014  . Chronic radicular low back pain 08/06/2012  . COPD (chronic obstructive pulmonary disease) ([place]) 04/21/2012  . Syncope 03/29/2012  . Hyponatremia 03/29/2012  . Localized osteoarthrosis not specified whether primary or secondary, lower leg 06/20/2010  . VARICOSE VEINS LOWER EXTREMITIES W/INFLAMMATION 02/12/2010  . [place] LOCALIZED OSTEOARTHROSIS INVOLVING HAND 10/16/2009  . DUPUYTREN'S CONTRACTURE, RIGHT 07/04/2009  . DEGENERATIVE DISC DISEASE, LUMBOSACRAL SPINE W/RADICULOPATHY 06/19/2009  . CARCINOMA, BASAL CELL 02/07/2009  . ANEMIA OF CHRONIC DISEASE 02/07/2009  . BURSITIS, RIGHT SHOULDER 10/07/2008  . CATARACT ASSOCIATED WITH OTHER SYNDROMES 08/01/2008  . DRY SKIN 08/01/2008  . CERUMEN IMPACTION, BILATERAL 03/01/2008  . PMR (polymyalgia rheumatica) ([place]) 01/18/2008  . CRAMP IN LIMB 01/18/2008  . DEPRESSION, SITUATIONAL, ACUTE 12/28/2007  . CONSTIPATION, DRUG INDUCED 12/28/2007  . POLYNEUROPATHY OTHER DISEASES CLASSIFIED ELSW 09/24/2007  . WEIGHT LOSS,  ABNORMAL 01/07/2007  . Hyperlipidemia 09/22/2006  . Essential hypertension 09/22/2006  . GERD (gastroesophageal reflux disease) 09/22/2006  . Osteoarthritis 09/22/2006    Daphne Karrer, PTA 11/06/2018, 9:27 AM  [facility] [address] [place], [place], [address] Phone: [phone]   Fax:  [phone]  Name: Kaiyan Luczak MRN: [id number] Date of Birth: 06/08/1931

## 2018-11-09 ENCOUNTER — Ambulatory Visit: Payer: Medicare Other | Admitting: Physical Therapy

## 2018-11-09 ENCOUNTER — Other Ambulatory Visit: Payer: Self-pay

## 2018-11-09 ENCOUNTER — Encounter: Payer: Self-pay | Admitting: Physical Therapy

## 2018-11-09 DIAGNOSIS — R279 Unspecified lack of coordination: Secondary | ICD-10-CM

## 2018-11-09 DIAGNOSIS — M25561 Pain in right knee: Secondary | ICD-10-CM

## 2018-11-09 DIAGNOSIS — M6281 Muscle weakness (generalized): Secondary | ICD-10-CM

## 2018-11-09 DIAGNOSIS — G8929 Other chronic pain: Secondary | ICD-10-CM

## 2018-11-09 DIAGNOSIS — R296 Repeated falls: Secondary | ICD-10-CM

## 2018-11-09 DIAGNOSIS — M25661 Stiffness of right knee, not elsewhere classified: Secondary | ICD-10-CM

## 2018-11-09 DIAGNOSIS — M25552 Pain in left hip: Secondary | ICD-10-CM

## 2018-11-09 DIAGNOSIS — R262 Difficulty in walking, not elsewhere classified: Secondary | ICD-10-CM

## 2018-11-09 NOTE — Therapy (Signed)
[facility] [address] [place], [place], [address] Phone: [phone]   Fax:  [phone]  Physical Therapy Treatment  Patient Details  Name: Darin [name] MRN: [id number] Date of Birth: 10-24-1931 Referring Provider (PT): Dr. Hulan Saas    Encounter Date: 11/09/2018  PT End of Session - 11/09/18 1017    Visit Number  30    Date for PT Re-Evaluation  12/08/18    Authorization Type  Medicare     PT Start Time  1013    PT Stop Time  1057    PT Time Calculation (min)  44 min    Activity Tolerance  Patient tolerated treatment well    Behavior During Therapy  [facility] for tasks assessed/performed       Past Medical History:  Diagnosis Date  . Arthritis   . GERD (gastroesophageal reflux disease)   . H/O hiatal hernia    "FOR YEARS" " NO PROBLEM"  . Hyperlipidemia   . Hypertension   . Knee pain   . Neuropathy    PERIPHERAL   . Osteoporosis   . PMR (polymyalgia rheumatica) (HCC)   . Polymyalgia ([place]) 5 YEARS    Past Surgical History:  Procedure Laterality Date  . CARPAL TUNNEL RELEASE    . CATARACT EXTRACTION     right  . COLONOSCOPY  2007  . INGUINAL HERNIA REPAIR  10/14/2011   Procedure: HERNIA REPAIR INGUINAL ADULT;  Surgeon: [name], MD;  Location: [facility];  Service: General;  Laterality: Right;  Right Inguinal Hernia Repair with Mesh  . TONSILLECTOMY    . VARICOSE VEIN SURGERY      There were no vitals filed for this visit.  Subjective Assessment - 11/09/18 1018    Subjective  I wish my knee felt better.    Pertinent History  ESI on 7/31    Currently in Pain?  Yes    Pain Score  6     Pain Location  Knee    Pain Orientation  Right    Pain Descriptors / Indicators  Discomfort;Throbbing    Multiple Pain Sites  No                       [facility] Adult PT Treatment/Exercise - 11/09/18 0001      Knee/Hip Exercises: Stretches   Gastroc Stretch  Right;Left    Gastroc Stretch  Limitations  20x each, single leg safer than BIl       Knee/Hip Exercises: Aerobic   Nustep  seat 13 arms 11; L3 x15 min     PTA present to discuss status     Knee/Hip Exercises: Machines for Strengthening   Cybex Leg Press  Seat 9: #110 2x50 reps BLE (pt only completing through partial range); single leg #45 x50 reps each (pt completed through greater range with this)       Knee/Hip Exercises: Standing   Other Standing Knee Exercises  walking in clinic with RW;    7 llaps     Knee/Hip Exercises: Seated   Other Seated Knee/Hip Exercises  Seated chest press with blue band 50x,     Other Seated Knee/Hip Exercises  Blue band shld extensions blue band 30x                PT Short Term Goals - 10/27/18 2297      PT SHORT TERM GOAL #1   Title  be independent in initial HEP  Status  Achieved      PT SHORT TERM GOAL #2   Title  The patient will have improved right knee ROM 3-120 degrees needed for greater ease getting up and down from the chair and in/out of the car      PT [place] #3   Title  The patient will have improved hip flexor muscle length to 5 degrees and HS length to 65 degrees needed for improved stride length with ambulation    Status  Achieved      PT SHORT TERM GOAL #4   Title  Timed up and Go to 16 sec indicating improved gait safety      PT SHORT TERM GOAL #5   Title  Ten meter walk test to 14 sec indicate improved gait speed needed for community ambulation         PT Long Term Goals - 10/27/18 0849      PT LONG TERM GOAL #1   Title  be independent in advanced HEP and return to gym program to prevent declines in strength, ROM and balance    Status  On-going      PT LONG TERM GOAL #2   Title  The patient will be able to walk to the mailbox and to the curb to get the newspaper 150 feet each way with knee and hip pain 5/10 or less    Status  Achieved      PT LONG TERM GOAL #3   Title  Improved bil hip abduction, extension and right knee  extension strength to 4/5 needed for greater ease with sit to stand and walking/standing    Time  6    Period  Weeks    Status  On-going      PT LONG TERM GOAL #4   Title  The patient will be able to walk 10 m in < 17 sec without looking at the ground    Time  6    Period  Weeks    Status  Revised      PT LONG TERM GOAL #5   Title  The patient will be able to ambulate 370 feet in six minutes with RW needed for community ambulation    Time  8    Period  Weeks    Status  Revised      PT LONG TERM GOAL #6   Title  Timed up and go test of 19 sec or less indicating improved LE strength and gait speed    Time  6    Period  Weeks    Status  New            Plan - 11/09/18 1017    Clinical Impression Statement  Pt having moreisues with his knee discomfort than anything else per his report. He continues to "get around ok." Pt needs verbal reminding to take bigger steps or at least attempt to take bigger steps when walking.    Personal Factors and Comorbidities  Age;Past/Current Experience;Comorbidity 2;Comorbidity 3+;Time since onset of injury/illness/exacerbation    Examination-Activity Limitations  Locomotion Level    Rehab Potential  Good    Clinical Impairments Affecting Rehab Potential  risk for falls    PT Frequency  2x / week    PT Duration  6 weeks    PT Treatment/Interventions  ADLs/Self Care Home Management;Cryotherapy;Electrical Stimulation;Ultrasound;Moist Heat;Iontophoresis 4mg /ml Dexamethasone;Gait training;Stair training;Therapeutic activities;Therapeutic exercise;Patient/family education;Neuromuscular re-education;Manual techniques;Taping;Dry needling    PT Next Visit Plan  KX; walk  laps every visit with RW  gait endurance;  LE strengthening progression    PT Home Exercise Plan  Access Code: [email or web address]     Consulted and Agree with Plan of Care  Patient       Patient will benefit from skilled therapeutic intervention in order to improve the following deficits and  impairments:  Pain, Decreased range of motion, Decreased strength, Decreased activity tolerance, Impaired perceived functional ability, Difficulty walking, Decreased balance  Visit Diagnosis: 1. Stiffness of right knee, not elsewhere classified   2. Muscle weakness (generalized)   3. Chronic pain of right knee   4. Repeated falls   5. Unspecified lack of coordination   6. Difficulty in walking, not elsewhere classified   7. Pain in left hip        Problem List Patient Active Problem List   Diagnosis Date Noted  . Strain of left piriformis muscle 10/05/2018  . Pain due to onychomycosis of toenails of both feet 09/23/2018  . Chronic left SI joint pain 04/27/2018  . Right wrist pain 02/05/2018  . Degenerative arthritis of right knee 10/23/2017  . Left leg cellulitis 08/12/2017  . Arthritis of right knee 05/20/2017  . Diverticulosis of colon with hemorrhage   . Acute blood loss anemia   . GIB (gastrointestinal bleeding) 03/12/2017  . Leg edema, right 02/20/2015  . BPH (benign prostatic hyperplasia) 12/09/2014  . Chronic radicular low back pain 08/06/2012  . COPD (chronic obstructive pulmonary disease) ([place]) 04/21/2012  . Syncope 03/29/2012  . Hyponatremia 03/29/2012  . Localized osteoarthrosis not specified whether primary or secondary, lower leg 06/20/2010  . VARICOSE VEINS LOWER EXTREMITIES W/INFLAMMATION 02/12/2010  . [place] LOCALIZED OSTEOARTHROSIS INVOLVING HAND 10/16/2009  . DUPUYTREN'S CONTRACTURE, RIGHT 07/04/2009  . DEGENERATIVE DISC DISEASE, LUMBOSACRAL SPINE W/RADICULOPATHY 06/19/2009  . CARCINOMA, BASAL CELL 02/07/2009  . ANEMIA OF CHRONIC DISEASE 02/07/2009  . BURSITIS, RIGHT SHOULDER 10/07/2008  . CATARACT ASSOCIATED WITH OTHER SYNDROMES 08/01/2008  . DRY SKIN 08/01/2008  . CERUMEN IMPACTION, BILATERAL 03/01/2008  . PMR (polymyalgia rheumatica) ([place]) 01/18/2008  . CRAMP IN LIMB 01/18/2008  . DEPRESSION, SITUATIONAL, ACUTE 12/28/2007  . CONSTIPATION, DRUG INDUCED  12/28/2007  . POLYNEUROPATHY OTHER DISEASES CLASSIFIED ELSW 09/24/2007  . WEIGHT LOSS, ABNORMAL 01/07/2007  . Hyperlipidemia 09/22/2006  . Essential hypertension 09/22/2006  . GERD (gastroesophageal reflux disease) 09/22/2006  . Osteoarthritis 09/22/2006    Jaymian Bogart, PTA 11/09/2018, 10:58 AM  [facility] [address], [place], [place], [address] Phone: [phone]   Fax:  [phone]  Name: Darin [name] MRN: [id number] Date of Birth: [birth year]

## 2018-11-12 ENCOUNTER — Ambulatory Visit: Payer: Medicare Other | Admitting: Physical Therapy

## 2018-11-12 ENCOUNTER — Other Ambulatory Visit: Payer: Self-pay

## 2018-11-12 ENCOUNTER — Encounter: Payer: Self-pay | Admitting: Physical Therapy

## 2018-11-12 DIAGNOSIS — M6281 Muscle weakness (generalized): Secondary | ICD-10-CM

## 2018-11-12 DIAGNOSIS — G8929 Other chronic pain: Secondary | ICD-10-CM | POA: Diagnosis not present

## 2018-11-12 DIAGNOSIS — M25661 Stiffness of right knee, not elsewhere classified: Secondary | ICD-10-CM | POA: Diagnosis not present

## 2018-11-12 DIAGNOSIS — M25561 Pain in right knee: Secondary | ICD-10-CM | POA: Diagnosis not present

## 2018-11-12 DIAGNOSIS — R296 Repeated falls: Secondary | ICD-10-CM | POA: Diagnosis not present

## 2018-11-12 DIAGNOSIS — R279 Unspecified lack of coordination: Secondary | ICD-10-CM | POA: Diagnosis not present

## 2018-11-12 NOTE — Therapy (Signed)
[facility] [address] [place], [place], [address] Phone: [phone]   Fax:  [phone]  Physical Therapy Treatment  Patient Details  Name: Darin [name] MRN: [id number] Date of Birth: 1931-09-16 Referring Provider (PT): Dr. Hulan Saas    Encounter Date: 11/12/2018  PT End of Session - 11/12/18 1011    Visit Number  74    Date for PT Re-Evaluation  12/08/18    Authorization Type  Medicare     PT Start Time  1004    PT Stop Time 3295   PT Time Calculation (min)  48 min    Activity Tolerance  Patient tolerated treatment well       Past Medical History:  Diagnosis Date  . Arthritis   . GERD (gastroesophageal reflux disease)   . H/O hiatal hernia    "FOR YEARS" " NO PROBLEM"  . Hyperlipidemia   . Hypertension   . Knee pain   . Neuropathy    PERIPHERAL   . Osteoporosis   . PMR (polymyalgia rheumatica) (HCC)   . Polymyalgia ([place]) 5 YEARS    Past Surgical History:  Procedure Laterality Date  . CARPAL TUNNEL RELEASE    . CATARACT EXTRACTION     right  . COLONOSCOPY  2007  . INGUINAL HERNIA REPAIR  10/14/2011   Procedure: HERNIA REPAIR INGUINAL ADULT;  Surgeon: [name], MD;  Location: [place];  Service: General;  Laterality: Right;  Right Inguinal Hernia Repair with Mesh  . TONSILLECTOMY    . VARICOSE VEIN SURGERY      There were no vitals filed for this visit.  Subjective Assessment - 11/12/18 1004    Subjective  Today's a busy day.  Volunteering at the hospital this afternoon.  Sore in the knee and hip as usual.    Pertinent History  ESI on 7/31    Patient Stated Goals  I would  be able to walk with a cane but I'm not sure that's going to happen     Currently in Pain?  Yes    Pain Score  6     Pain Location  Knee    Pain Orientation  Right    Pain Score  6    Pain Location  Hip    Pain Orientation  Left                       [facility] Adult PT Treatment/Exercise -  11/12/18 0001      Knee/Hip Exercises: Aerobic   Nustep  seat 13, arms 11, L3 20 min while therapist discussing status       Knee/Hip Exercises: Machines for Strengthening   Cybex Leg Press  Seat 9: #110 2x50 reps BLE (pt only completing through partial range); single leg #45 x50 reps each (pt completed through greater range with this)       Knee/Hip Exercises: Standing   Heel Raises  Both;20 reps    Other Standing Knee Exercises  stepping over forward and laterally obstacle on floor10x each way       Knee/Hip Exercises: Seated   Knee/Hip Flexion  high table marching 15x     Sit to Sand  10 reps   from very high table holding 5# weight               PT Short Term Goals - 10/27/18 1884      PT SHORT TERM GOAL #1   Title  be independent in initial HEP      Status  Achieved      PT SHORT TERM GOAL #2   Title  The patient will have improved right knee ROM 3-120 degrees needed for greater ease getting up and down from the chair and in/out of the car      PT [place] #3   Title  The patient will have improved hip flexor muscle length to 5 degrees and HS length to 65 degrees needed for improved stride length with ambulation    Status  Achieved      PT SHORT TERM GOAL #4   Title  Timed up and Go to 16 sec indicating improved gait safety      PT SHORT TERM GOAL #5   Title  Ten meter walk test to 14 sec indicate improved gait speed needed for community ambulation         PT Long Term Goals - 10/27/18 0849      PT LONG TERM GOAL #1   Title  be independent in advanced HEP and return to gym program to prevent declines in strength, ROM and balance    Status  On-going      PT LONG TERM GOAL #2   Title  The patient will be able to walk to the mailbox and to the curb to get the newspaper 150 feet each way with knee and hip pain 5/10 or less    Status  Achieved      PT LONG TERM GOAL #3   Title  Improved bil hip abduction, extension and right knee extension strength  to 4/5 needed for greater ease with sit to stand and walking/standing    Time  6    Period  Weeks    Status  On-going      PT LONG TERM GOAL #4   Title  The patient will be able to walk 10 m in < 17 sec without looking at the ground    Time  6    Period  Weeks    Status  Revised      PT LONG TERM GOAL #5   Title  The patient will be able to ambulate 370 feet in six minutes with RW needed for community ambulation    Time  8    Period  Weeks    Status  Revised      PT LONG TERM GOAL #6   Title  Timed up and go test of 19 sec or less indicating improved LE strength and gait speed    Time  6    Period  Weeks    Status  New            Plan - 11/12/18 1014    Clinical Impression Statement  The patient is able to make slow but progressive gains in exercise tolerance and ambulation distance.  Frequent verbal cues needed to hold head erect and to increase step length.  Improved step initiation and step length noted with a target to step over.  Therapist providing supervision for safety and monitoring response to treatment.  Hip and knee pain are not exacerbated post treatment session.    Rehab Potential  Good    Clinical Impairments Affecting Rehab Potential  risk for falls    PT Frequency  2x / week    PT Duration  6 weeks    PT Treatment/Interventions  ADLs/Self Care Home Management;Cryotherapy;Electrical Stimulation;Ultrasound;Moist Heat;Iontophoresis 4mg /ml Dexamethasone;Gait training;Stair training;Therapeutic activities;Therapeutic  exercise;Patient/family education;Neuromuscular re-education;Manual techniques;Taping;Dry needling    PT Next Visit Plan  KX; walk laps every visit with RW  gait endurance;  LE strengthening progression    PT Home Exercise Plan  Access Code: [email or web address]        Patient will benefit from skilled therapeutic intervention in order to improve the following deficits and impairments:  Pain, Decreased range of motion, Decreased strength, Decreased activity  tolerance, Impaired perceived functional ability, Difficulty walking, Decreased balance  Visit Diagnosis: Stiffness of right knee, not elsewhere classified  Muscle weakness (generalized)  Chronic pain of right knee     Problem List Patient Active Problem List   Diagnosis Date Noted  . Strain of left piriformis muscle 10/05/2018  . Pain due to onychomycosis of toenails of both feet 09/23/2018  . Chronic left SI joint pain 04/27/2018  . Right wrist pain 02/05/2018  . Degenerative arthritis of right knee 10/23/2017  . Left leg cellulitis 08/12/2017  . Arthritis of right knee 05/20/2017  . Diverticulosis of colon with hemorrhage   . Acute blood loss anemia   . GIB (gastrointestinal bleeding) 03/12/2017  . Leg edema, right 02/20/2015  . BPH (benign prostatic hyperplasia) 12/09/2014  . Chronic radicular low back pain 08/06/2012  . COPD (chronic obstructive pulmonary disease) ([place]) 04/21/2012  . Syncope 03/29/2012  . Hyponatremia 03/29/2012  . Localized osteoarthrosis not specified whether primary or secondary, lower leg 06/20/2010  . VARICOSE VEINS LOWER EXTREMITIES W/INFLAMMATION 02/12/2010  . [place] LOCALIZED OSTEOARTHROSIS INVOLVING HAND 10/16/2009  . DUPUYTREN'S CONTRACTURE, RIGHT 07/04/2009  . DEGENERATIVE DISC DISEASE, LUMBOSACRAL SPINE W/RADICULOPATHY 06/19/2009  . CARCINOMA, BASAL CELL 02/07/2009  . ANEMIA OF CHRONIC DISEASE 02/07/2009  . BURSITIS, RIGHT SHOULDER 10/07/2008  . CATARACT ASSOCIATED WITH OTHER SYNDROMES 08/01/2008  . DRY SKIN 08/01/2008  . CERUMEN IMPACTION, BILATERAL 03/01/2008  . PMR (polymyalgia rheumatica) ([place]) 01/18/2008  . CRAMP IN LIMB 01/18/2008  . DEPRESSION, SITUATIONAL, ACUTE 12/28/2007  . CONSTIPATION, DRUG INDUCED 12/28/2007  . POLYNEUROPATHY OTHER DISEASES CLASSIFIED ELSW 09/24/2007  . WEIGHT LOSS, ABNORMAL 01/07/2007  . Hyperlipidemia 09/22/2006  . Essential hypertension 09/22/2006  . GERD (gastroesophageal reflux disease) 09/22/2006  .  Osteoarthritis 09/22/2006   Ruben Im, PT 11/12/18 1:52 PM Phone: [phone] Fax: [phone] Alvera Singh 11/12/2018, 1:51 PM  [facility] [address] [place], [place], [address] Phone: [phone]   Fax:  [phone]  Name: Darin [name] MRN: [id number] Date of Birth: 23-May-1931

## 2018-11-14 ENCOUNTER — Other Ambulatory Visit: Payer: Self-pay | Admitting: Family Medicine

## 2018-11-14 DIAGNOSIS — I10 Essential (primary) hypertension: Secondary | ICD-10-CM

## 2018-11-17 ENCOUNTER — Ambulatory Visit: Payer: Medicare Other | Admitting: Physical Therapy

## 2018-11-17 ENCOUNTER — Other Ambulatory Visit: Payer: Self-pay

## 2018-11-17 ENCOUNTER — Encounter: Payer: Self-pay | Admitting: Physical Therapy

## 2018-11-17 DIAGNOSIS — G8929 Other chronic pain: Secondary | ICD-10-CM | POA: Diagnosis not present

## 2018-11-17 DIAGNOSIS — R296 Repeated falls: Secondary | ICD-10-CM | POA: Diagnosis not present

## 2018-11-17 DIAGNOSIS — M6281 Muscle weakness (generalized): Secondary | ICD-10-CM

## 2018-11-17 DIAGNOSIS — M25661 Stiffness of right knee, not elsewhere classified: Secondary | ICD-10-CM

## 2018-11-17 DIAGNOSIS — M25561 Pain in right knee: Secondary | ICD-10-CM | POA: Diagnosis not present

## 2018-11-17 DIAGNOSIS — R279 Unspecified lack of coordination: Secondary | ICD-10-CM | POA: Diagnosis not present

## 2018-11-17 NOTE — Therapy (Signed)
[facility] [address], [place], [place], [address] Phone: [phone]   Fax:  [phone]  Physical Therapy Treatment  Patient Details  Name: Darin [name] MRN: [id number] Date of Birth: 17-May-1931 Referring Provider (PT): Dr. Hulan Saas    Encounter Date: 11/17/2018  PT End of Session - 11/17/18 0732    Visit Number  35    Date for PT Re-Evaluation  12/08/18    Authorization Type  Medicare     PT Start Time  0728    PT Stop Time  0814    PT Time Calculation (min)  46 min    Activity Tolerance  Patient tolerated treatment well       Past Medical History:  Diagnosis Date  . Arthritis   . GERD (gastroesophageal reflux disease)   . H/O hiatal hernia    "FOR YEARS" " NO PROBLEM"  . Hyperlipidemia   . Hypertension   . Knee pain   . Neuropathy    PERIPHERAL   . Osteoporosis   . PMR (polymyalgia rheumatica) (HCC)   . Polymyalgia ([place]) 5 YEARS    Past Surgical History:  Procedure Laterality Date  . CARPAL TUNNEL RELEASE    . CATARACT EXTRACTION     right  . COLONOSCOPY  2007  . INGUINAL HERNIA REPAIR  10/14/2011   Procedure: HERNIA REPAIR INGUINAL ADULT;  Surgeon: [name], MD;  Location: [place];  Service: General;  Laterality: Right;  Right Inguinal Hernia Repair with Mesh  . TONSILLECTOMY    . VARICOSE VEIN SURGERY      There were no vitals filed for this visit.  Subjective Assessment - 11/17/18 0730    Subjective  I'm the same.  I drove by [employer] and they are closed up tight.  I need to continue coming here and I'm out of appointments.  I never know when my knee is going to give way.    Pertinent History  ESI on 7/31    How long can you stand comfortably?  I shave with right hand and brace counter with left    How long can you walk comfortably?  RW to mailbox 150 feet 2x/day 6-8/10 pain level    Patient Stated Goals  I would  be able to walk with a cane but I'm not sure that's  going to happen     Currently in Pain?  Yes    Pain Score  6     Pain Location  Knee    Pain Orientation  Right    Pain Score  6    Pain Location  Hip    Pain Orientation  Left                       [facility] Adult PT Treatment/Exercise - 11/17/18 0001      Ambulation/Gait   Gait Comments  160 feet with cues and obstacles in path to encourage longer steps, avoid shuffling       Therapeutic Activites    ADL's  steps, walking, standing, sit to stand       Knee/Hip Exercises: Stretches   Hip Flexor Stretch Limitations  2nd step hip flexor stretch 10x right/left       Knee/Hip Exercises: Aerobic   Nustep  seat 13 arms 11; L3 x15 min     present to discuss status;  52 steps per min      Knee/Hip Exercises: Machines for Strengthening  Cybex Leg Press  Seat 9: #110 2x50 reps BLE (pt only completing through partial range); single leg #45 x50 reps each (pt completed through greater range with this)       Knee/Hip Exercises: Standing   Forward Step Up  Left;10 reps    Forward Step Up Limitations  right 2 inch step 10x    Other Standing Knee Exercises  stepping over forward and laterally obstacle on floor10x each way 4 inch vertical height with weight shifiting to accept increased weight bearing       Knee/Hip Exercises: Seated   Sit to Sand  10 reps   from very high table holding 8# weight               PT Short Term Goals - 10/27/18 [id number]      PT SHORT TERM GOAL #1   Title  be independent in initial HEP      Status  Achieved      PT SHORT TERM GOAL #2   Title  The patient will have improved right knee ROM 3-120 degrees needed for greater ease getting up and down from the chair and in/out of the car      PT SHORT TERM GOAL #3   Title  The patient will have improved hip flexor muscle length to 5 degrees and HS length to 65 degrees needed for improved stride length with ambulation    Status  Achieved      PT SHORT TERM GOAL #4   Title  Timed up and Go to  16 sec indicating improved gait safety      PT SHORT TERM GOAL #5   Title  Ten meter walk test to 14 sec indicate improved gait speed needed for community ambulation         PT Long Term Goals - 10/27/18 0849      PT LONG TERM GOAL #1   Title  be independent in advanced HEP and return to gym program to prevent declines in strength, ROM and balance    Status  On-going      PT LONG TERM GOAL #2   Title  The patient will be able to walk to the mailbox and to the curb to get the newspaper 150 feet each way with knee and hip pain 5/10 or less    Status  Achieved      PT LONG TERM GOAL #3   Title  Improved bil hip abduction, extension and right knee extension strength to 4/5 needed for greater ease with sit to stand and walking/standing    Time  6    Period  Weeks    Status  On-going      PT LONG TERM GOAL #4   Title  The patient will be able to walk 10 m in < 17 sec without looking at the ground    Time  6    Period  Weeks    Status  Revised      PT LONG TERM GOAL #5   Title  The patient will be able to ambulate 370 feet in six minutes with RW needed for community ambulation    Time  8    Period  Weeks    Status  Revised      PT LONG TERM GOAL #6   Title  Timed up and go test of 19 sec or less indicating improved LE strength and gait speed    Time  6  Period  Weeks    Status  New            Plan - 11/17/18 0816    Clinical Impression Statement  The patient's hip and knee pain remain steady in presence and intensity and is not worsened with exercises performed today.   Cues needed to increase step length and avoid shuffling.  His gait pattern is better when he has obstacles to step over.   He has no options for community based exercise secondary to gyms remaining closed per governor order for covid.  He would benefit from continued skilled PT in attempts to improve strength, function and balance.  Improvements may be miminal but without PT he would have a certain  deterioration.    Personal Factors and Comorbidities  Age;Past/Current Experience;Comorbidity 2;Comorbidity 3+;Time since onset of injury/illness/exacerbation    Rehab Potential  Good    Clinical Impairments Affecting Rehab Potential  risk for falls    PT Frequency  2x / week    PT Duration  6 weeks    PT Treatment/Interventions  ADLs/Self Care Home Management;Cryotherapy;Electrical Stimulation;Ultrasound;Moist Heat;Iontophoresis 4mg /ml Dexamethasone;Gait training;Stair training;Therapeutic activities;Therapeutic exercise;Patient/family education;Neuromuscular re-education;Manual techniques;Taping;Dry needling    PT Next Visit Plan 50th visit progress note;  TUG;  12meter walk;  KX; walk laps every visit with RW  gait endurance;  LE strengthening progression    PT Home Exercise Plan  Access Code: [email or web address]     Consulted and Agree with Plan of Care  Patient       Patient will benefit from skilled therapeutic intervention in order to improve the following deficits and impairments:  Pain, Decreased range of motion, Decreased strength, Decreased activity tolerance, Impaired perceived functional ability, Difficulty walking, Decreased balance  Visit Diagnosis: Stiffness of right knee, not elsewhere classified  Muscle weakness (generalized)  Chronic pain of right knee     Problem List Patient Active Problem List   Diagnosis Date Noted  . Strain of left piriformis muscle 10/05/2018  . Pain due to onychomycosis of toenails of both feet 09/23/2018  . Chronic left SI joint pain 04/27/2018  . Right wrist pain 02/05/2018  . Degenerative arthritis of right knee 10/23/2017  . Left leg cellulitis 08/12/2017  . Arthritis of right knee 05/20/2017  . Diverticulosis of colon with hemorrhage   . Acute blood loss anemia   . GIB (gastrointestinal bleeding) 03/12/2017  . Leg edema, right 02/20/2015  . BPH (benign prostatic hyperplasia) 12/09/2014  . Chronic radicular low back pain 08/06/2012  . COPD  (chronic obstructive pulmonary disease) ([place]) 04/21/2012  . Syncope 03/29/2012  . Hyponatremia 03/29/2012  . Localized osteoarthrosis not specified whether primary or secondary, lower leg 06/20/2010  . VARICOSE VEINS LOWER EXTREMITIES W/INFLAMMATION 02/12/2010  . [place] LOCALIZED OSTEOARTHROSIS INVOLVING HAND 10/16/2009  . DUPUYTREN'S CONTRACTURE, RIGHT 07/04/2009  . DEGENERATIVE DISC DISEASE, LUMBOSACRAL SPINE W/RADICULOPATHY 06/19/2009  . CARCINOMA, BASAL CELL 02/07/2009  . ANEMIA OF CHRONIC DISEASE 02/07/2009  . BURSITIS, RIGHT SHOULDER 10/07/2008  . CATARACT ASSOCIATED WITH OTHER SYNDROMES 08/01/2008  . DRY SKIN 08/01/2008  . CERUMEN IMPACTION, BILATERAL 03/01/2008  . PMR (polymyalgia rheumatica) ([place]) 01/18/2008  . CRAMP IN LIMB 01/18/2008  . DEPRESSION, SITUATIONAL, ACUTE 12/28/2007  . CONSTIPATION, DRUG INDUCED 12/28/2007  . POLYNEUROPATHY OTHER DISEASES CLASSIFIED ELSW 09/24/2007  . WEIGHT LOSS, ABNORMAL 01/07/2007  . Hyperlipidemia 09/22/2006  . Essential hypertension 09/22/2006  . GERD (gastroesophageal reflux disease) 09/22/2006  . Osteoarthritis 09/22/2006   Ruben Im, PT 11/17/18 10:22 AM Phone: [phone] Fax: [phone] Moshe Cipro,  Yves Dill 11/17/2018, 10:21 AM  [facility] [address] [place], [place], [address] Phone: [phone]   Fax:  [phone]  Name: Omni Hellmich MRN: [id number] Date of Birth: 28-Nov-1931

## 2018-11-19 ENCOUNTER — Encounter: Payer: Self-pay | Admitting: Physical Therapy

## 2018-11-19 ENCOUNTER — Ambulatory Visit: Payer: Medicare Other | Admitting: Physical Therapy

## 2018-11-19 ENCOUNTER — Other Ambulatory Visit: Payer: Self-pay

## 2018-11-19 DIAGNOSIS — M6281 Muscle weakness (generalized): Secondary | ICD-10-CM | POA: Diagnosis not present

## 2018-11-19 DIAGNOSIS — M25661 Stiffness of right knee, not elsewhere classified: Secondary | ICD-10-CM | POA: Diagnosis not present

## 2018-11-19 DIAGNOSIS — G8929 Other chronic pain: Secondary | ICD-10-CM | POA: Diagnosis not present

## 2018-11-19 DIAGNOSIS — M25561 Pain in right knee: Secondary | ICD-10-CM | POA: Diagnosis not present

## 2018-11-19 DIAGNOSIS — R279 Unspecified lack of coordination: Secondary | ICD-10-CM | POA: Diagnosis not present

## 2018-11-19 DIAGNOSIS — R296 Repeated falls: Secondary | ICD-10-CM | POA: Diagnosis not present

## 2018-11-19 NOTE — Therapy (Signed)
[facility] [address], [place], [place], [address] Phone: [phone]   Fax:  [phone]  Physical Therapy Treatment  Patient Details  Name: Darin [name] MRN: [id number] Date of Birth: 1931/03/30 Referring Provider (PT): Dr. Hulan Saas   Progress Note Reporting Period 10/16/18 to 11/19/2018  See note below for Objective Data and Assessment of Progress/Goals.      Encounter Date: 11/19/2018  PT End of Session - 11/19/18 0856    Visit Number  50    Date for PT Re-Evaluation  12/08/18    Authorization Type  Medicare     PT Start Time  0815    PT Stop Time  [id number]    PT Time Calculation (min)  44 min    Activity Tolerance  Patient tolerated treatment well       Past Medical History:  Diagnosis Date  . Arthritis   . GERD (gastroesophageal reflux disease)   . H/O hiatal hernia    "FOR YEARS" " NO PROBLEM"  . Hyperlipidemia   . Hypertension   . Knee pain   . Neuropathy    PERIPHERAL   . Osteoporosis   . PMR (polymyalgia rheumatica) (HCC)   . Polymyalgia ([place]) 5 YEARS    Past Surgical History:  Procedure Laterality Date  . CARPAL TUNNEL RELEASE    . CATARACT EXTRACTION     right  . COLONOSCOPY  2007  . INGUINAL HERNIA REPAIR  10/14/2011   Procedure: HERNIA REPAIR INGUINAL ADULT;  Surgeon: [name], MD;  Location: [facility];  Service: General;  Laterality: Right;  Right Inguinal Hernia Repair with Mesh  . TONSILLECTOMY    . VARICOSE VEIN SURGERY      There were no vitals filed for this visit.  Subjective Assessment - 11/19/18 0823    Subjective  Knee and hip not better but not worse.  Did fine following last treatment session.  Volunteers at the hospital this afternoon.    How long can you stand comfortably?  I shave with right hand and brace counter with left    How long can you walk comfortably?  RW to mailbox 150 feet 2x/day 6-8/10 pain level    Currently in Pain?  Yes    Pain Score  6           [facility] PT Assessment - 11/19/18 0001      Standardized Balance Assessment   10 Meter Walk  18.06 sec RW      Timed Up and Go Test   Normal TUG (seconds)  18.91    TUG Comments  18.91 with foam (no worse without foam)                    [facility] Adult PT Treatment/Exercise - 11/19/18 0001      Therapeutic Activites    ADL's  steps, walking, standing, sit to stand       Knee/Hip Exercises: Stretches   Hip Flexor Stretch Limitations  2nd step hip flexor stretch 10x right/left       Knee/Hip Exercises: Aerobic   Nustep  seat 13 arms 11; L3 x15 min     present to discuss status;  52 steps per min      Knee/Hip Exercises: Machines for Strengthening   Cybex Leg Press  Seat 9: #110 2x50 reps BLE (pt only completing through partial range); single leg #45 x50 reps each (pt completed through greater range with this)  Knee/Hip Exercises: Standing   Forward Step Up  Right;Left;Hand Hold: 2;Step Height: 6"    Other Standing Knee Exercises  3 obstacles on floor lateral and forward stepping 5x3      Knee/Hip Exercises: Seated   Other Seated Knee/Hip Exercises  seated step taps 45 sec               PT Short Term Goals - 10/27/18 [id number]      PT SHORT TERM GOAL #1   Title  be independent in initial HEP      Status  Achieved      PT SHORT TERM GOAL #2   Title  The patient will have improved right knee ROM 3-120 degrees needed for greater ease getting up and down from the chair and in/out of the car      PT SHORT TERM GOAL #3   Title  The patient will have improved hip flexor muscle length to 5 degrees and HS length to 65 degrees needed for improved stride length with ambulation    Status  Achieved      PT SHORT TERM GOAL #4   Title  Timed up and Go to 16 sec indicating improved gait safety      PT SHORT TERM GOAL #5   Title  Ten meter walk test to 14 sec indicate improved gait speed needed for community ambulation         PT Long Term Goals -  11/19/18 1730      PT LONG TERM GOAL #1   Title  be independent in advanced HEP and return to gym program to prevent declines in strength, ROM and balance    Time  6    Period  Weeks    Status  On-going      PT LONG TERM GOAL #2   Title  The patient will be able to walk to the mailbox and to the curb to get the newspaper 150 feet each way with knee and hip pain 5/10 or less    Status  Achieved      PT LONG TERM GOAL #3   Title  Improved bil hip abduction, extension and right knee extension strength to 4/5 needed for greater ease with sit to stand and walking/standing    Time  6    Period  Weeks    Status  On-going      PT LONG TERM GOAL #4   Title  The patient will be able to walk 10 m in < 17 sec without looking at the ground    Time  6    Period  Weeks    Status  Revised      PT LONG TERM GOAL #5   Title  The patient will be able to ambulate 370 feet in six minutes with RW needed for community ambulation    Time  8    Period  Weeks    Status  On-going      PT LONG TERM GOAL #6   Title  Timed up and go test of 19 sec or less indicating improved LE strength and gait speed    Status  Achieved            Plan - 11/19/18 0857    Clinical Impression Statement  The patient has improved Timed up and Go time by > 2 seconds including greater ease with sit to stand.   He continues to have decreased step length with short, shuffled steps  with his head down.  He has difficulty at times initiating gait and stutter steps in place before moving forward.   Signficantly mproved gait pattern and initiation of movement  with floor obstacles.  Able to do 6 inch step ups with right LE today.  He would benefit from continued  skilled PT to prevent deterioration.  Without PT he would have a decline in function and mobility.    Personal Factors and Comorbidities  Age;Past/Current Experience;Comorbidity 2;Comorbidity 3+;Time since onset of injury/illness/exacerbation    Rehab Potential  Good     Clinical Impairments Affecting Rehab Potential  risk for falls    PT Frequency  2x / week    PT Duration  6 weeks    PT Treatment/Interventions  ADLs/Self Care Home Management;Cryotherapy;Electrical Stimulation;Ultrasound;Moist Heat;Iontophoresis 4mg /ml Dexamethasone;Gait training;Stair training;Therapeutic activities;Therapeutic exercise;Patient/family education;Neuromuscular re-education;Manual techniques;Taping;Dry needling    PT Next Visit Plan  KX; walk laps every visit with RW  gait endurance;  LE strengthening progression;  Do 6 min walk test    PT Home Exercise Plan  Access Code: [email or web address]        Patient will benefit from skilled therapeutic intervention in order to improve the following deficits and impairments:     Visit Diagnosis: Stiffness of right knee, not elsewhere classified  Muscle weakness (generalized)  Chronic pain of right knee     Problem List Patient Active Problem List   Diagnosis Date Noted  . Strain of left piriformis muscle 10/05/2018  . Pain due to onychomycosis of toenails of both feet 09/23/2018  . Chronic left SI joint pain 04/27/2018  . Right wrist pain 02/05/2018  . Degenerative arthritis of right knee 10/23/2017  . Left leg cellulitis 08/12/2017  . Arthritis of right knee 05/20/2017  . Diverticulosis of colon with hemorrhage   . Acute blood loss anemia   . GIB (gastrointestinal bleeding) 03/12/2017  . Leg edema, right 02/20/2015  . BPH (benign prostatic hyperplasia) 12/09/2014  . Chronic radicular low back pain 08/06/2012  . COPD (chronic obstructive pulmonary disease) ([place]) 04/21/2012  . Syncope 03/29/2012  . Hyponatremia 03/29/2012  . Localized osteoarthrosis not specified whether primary or secondary, lower leg 06/20/2010  . VARICOSE VEINS LOWER EXTREMITIES W/INFLAMMATION 02/12/2010  . [place] LOCALIZED OSTEOARTHROSIS INVOLVING HAND 10/16/2009  . DUPUYTREN'S CONTRACTURE, RIGHT 07/04/2009  . DEGENERATIVE DISC DISEASE, LUMBOSACRAL SPINE  W/RADICULOPATHY 06/19/2009  . CARCINOMA, BASAL CELL 02/07/2009  . ANEMIA OF CHRONIC DISEASE 02/07/2009  . BURSITIS, RIGHT SHOULDER 10/07/2008  . CATARACT ASSOCIATED WITH OTHER SYNDROMES 08/01/2008  . DRY SKIN 08/01/2008  . CERUMEN IMPACTION, BILATERAL 03/01/2008  . PMR (polymyalgia rheumatica) ([place]) 01/18/2008  . CRAMP IN LIMB 01/18/2008  . DEPRESSION, SITUATIONAL, ACUTE 12/28/2007  . CONSTIPATION, DRUG INDUCED 12/28/2007  . POLYNEUROPATHY OTHER DISEASES CLASSIFIED ELSW 09/24/2007  . WEIGHT LOSS, ABNORMAL 01/07/2007  . Hyperlipidemia 09/22/2006  . Essential hypertension 09/22/2006  . GERD (gastroesophageal reflux disease) 09/22/2006  . Osteoarthritis 09/22/2006   Ruben Im, PT 11/19/18 5:33 PM Phone: [phone] Fax: [phone] Alvera Singh 11/19/2018, 5:32 PM  [facility] [address] [place], [place], [address] Phone: [phone]   Fax:  [phone]  Name: Darin [name] MRN: [id number] Date of Birth: September 27, 1931

## 2018-11-20 ENCOUNTER — Other Ambulatory Visit: Payer: Self-pay | Admitting: Family Medicine

## 2018-11-20 DIAGNOSIS — I10 Essential (primary) hypertension: Secondary | ICD-10-CM

## 2018-11-20 NOTE — Telephone Encounter (Signed)
Copied from [place] [id number]. Topic: Quick Communication - Rx Refill/Question >> Nov 20, 2018  1:53 PM Rainey Pines A wrote:  Medication: amLODipine (NORVASC) 5 MG tablet ( Patient stated that he need  new prescription sent to pharmacy.)  Has the patient contacted their pharmacy? Yes (Agent: If no, request that the patient contact the pharmacy for the refill.) (Agent: If yes, when and what did the pharmacy advise?)Contact PCP  Preferred Pharmacy (with phone number or street name): [facility] [place], [place] to [facility]istered [employer] [facility] [phone] (Phone) [phone] (Fax)    Agent: Please be advised that RX refills may take up to 3 business days. We ask that you follow-up with your pharmacy.

## 2018-11-24 ENCOUNTER — Ambulatory Visit: Payer: Medicare Other | Attending: Family Medicine | Admitting: Physical Therapy

## 2018-11-24 ENCOUNTER — Encounter: Payer: Self-pay | Admitting: Physical Therapy

## 2018-11-24 ENCOUNTER — Other Ambulatory Visit: Payer: Self-pay

## 2018-11-24 DIAGNOSIS — M25661 Stiffness of right knee, not elsewhere classified: Secondary | ICD-10-CM | POA: Diagnosis not present

## 2018-11-24 DIAGNOSIS — M6281 Muscle weakness (generalized): Secondary | ICD-10-CM | POA: Diagnosis not present

## 2018-11-24 DIAGNOSIS — G8929 Other chronic pain: Secondary | ICD-10-CM | POA: Insufficient documentation

## 2018-11-24 DIAGNOSIS — R296 Repeated falls: Secondary | ICD-10-CM | POA: Insufficient documentation

## 2018-11-24 DIAGNOSIS — M25561 Pain in right knee: Secondary | ICD-10-CM | POA: Diagnosis not present

## 2018-11-24 NOTE — Therapy (Signed)
[facility] [address], [place], [place], [address] Phone: [phone]   Fax:  [phone]  Physical Therapy Treatment  Patient Details  Name: Darin [name] MRN: [id number] Date of Birth: 09/03/1931 Referring Provider (PT): Dr. Hulan Saas    Encounter Date: 11/24/2018  PT End of Session - 11/24/18 1051    Visit Number  27    Date for PT Re-Evaluation  12/08/18    Authorization Type  Medicare     PT Start Time  [id number]    PT Stop Time  1128    PT Time Calculation (min)  41 min    Activity Tolerance  Patient tolerated treatment well       Past Medical History:  Diagnosis Date  . Arthritis   . GERD (gastroesophageal reflux disease)   . H/O hiatal hernia    "FOR YEARS" " NO PROBLEM"  . Hyperlipidemia   . Hypertension   . Knee pain   . Neuropathy    PERIPHERAL   . Osteoporosis   . PMR (polymyalgia rheumatica) (HCC)   . Polymyalgia ([place]) 5 YEARS    Past Surgical History:  Procedure Laterality Date  . CARPAL TUNNEL RELEASE    . CATARACT EXTRACTION     right  . COLONOSCOPY  2007  . INGUINAL HERNIA REPAIR  10/14/2011   Procedure: HERNIA REPAIR INGUINAL ADULT;  Surgeon: [name], MD;  Location: [facility];  Service: General;  Laterality: Right;  Right Inguinal Hernia Repair with Mesh  . TONSILLECTOMY    . VARICOSE VEIN SURGERY      There were no vitals filed for this visit.  Subjective Assessment - 11/24/18 1048    Subjective  Things are a little bit creakier b/c of the weather.    Pertinent History  ESI on 7/31    Currently in Pain?  Yes    Pain Score  6     Pain Location  Knee    Pain Orientation  Right    Pain Score  6    Pain Location  Hip    Pain Orientation  Left         [facility] PT Assessment - 11/24/18 0001      6 Minute Walk- Baseline   Perceived Rate of Exertion ([place])  7- Very, very light      6 minute walk test results    Aerobic Endurance Distance Walked  600    Endurance  additional comments  RW                   [facility] Adult PT Treatment/Exercise - 11/24/18 0001      Therapeutic Activites    ADL's  steps, walking, standing, sit to stand       Knee/Hip Exercises: Stretches   Hip Flexor Stretch Limitations  2nd step hip flexor stretch 10x right/left       Knee/Hip Exercises: Aerobic   Nustep  seat 13 arms 11; L3 x15 min     present to discuss status;  52 steps per min      Knee/Hip Exercises: Machines for Strengthening   Cybex Leg Press  Seat 9: #110 2x50 reps BLE (pt only completing through partial range); single leg #45 x50 reps each (pt completed through greater range with this)       Knee/Hip Exercises: Standing   Forward Step Up  Right;Left;15 reps;Hand Hold: 2;Step Height: 6"    Other Standing Knee Exercises  stepping over obstacle  1 minute     Other Standing Knee Exercises  stepping forward over obstacle 10x right/left                PT Short Term Goals - 10/27/18 [id number]      PT SHORT TERM GOAL #1   Title  be independent in initial HEP      Status  Achieved      PT SHORT TERM GOAL #2   Title  The patient will have improved right knee ROM 3-120 degrees needed for greater ease getting up and down from the chair and in/out of the car      PT SHORT TERM GOAL #3   Title  The patient will have improved hip flexor muscle length to 5 degrees and HS length to 65 degrees needed for improved stride length with ambulation    Status  Achieved      PT SHORT TERM GOAL #4   Title  Timed up and Go to 16 sec indicating improved gait safety      PT SHORT TERM GOAL #5   Title  Ten meter walk test to 14 sec indicate improved gait speed needed for community ambulation         PT Long Term Goals - 11/24/18 1753      PT LONG TERM GOAL #1   Title  be independent in advanced HEP and return to gym program to prevent declines in strength, ROM and balance    Time  6    Period  Weeks    Status  On-going      PT LONG TERM GOAL #2    Title  The patient will be able to walk to the mailbox and to the curb to get the newspaper 150 feet each way with knee and hip pain 5/10 or less    Status  Achieved      PT LONG TERM GOAL #3   Title  Improved bil hip abduction, extension and right knee extension strength to 4/5 needed for greater ease with sit to stand and walking/standing    Time  6    Period  Weeks    Status  On-going      PT LONG TERM GOAL #4   Title  The patient will be able to walk 10 m in < 17 sec without looking at the ground    Time  6    Period  Weeks    Status  On-going      PT LONG TERM GOAL #5   Title  The patient will be able to ambulate 370 feet in six minutes with RW needed for community ambulation    Status  Achieved      PT LONG TERM GOAL #6   Title  Timed up and go test of 19 sec or less indicating improved LE strength and gait speed    Status  Achieved            Plan - 11/24/18 1748    Clinical Impression Statement  The patient has much improved 6 minute walk test with RW to 600 feet.  Although he ambulates at a slow pace, he is able to complete the walk test without any seated rest breaks which he needed on previous performances.  Improved step length and foot clearance with targets/obstacles on floor.  He is able to increase repetitions with 6 inch step ups without right knee give-way.  Therapist closely monitoring response and providing close supervision for  safety.    Rehab Potential  Good    Clinical Impairments Affecting Rehab Potential  risk for falls    PT Frequency  2x / week    PT Duration  6 weeks    PT Treatment/Interventions  ADLs/Self Care Home Management;Cryotherapy;Electrical Stimulation;Ultrasound;Moist Heat;Iontophoresis 4mg /ml Dexamethasone;Gait training;Stair training;Therapeutic activities;Therapeutic exercise;Patient/family education;Neuromuscular re-education;Manual techniques;Taping;Dry needling    PT Next Visit Plan  KX; walk laps every visit with RW  gait endurance;   LE strengthening progression;  obstacle negotiation;  step ups    PT Home Exercise Plan  Access Code: [email or web address]        Patient will benefit from skilled therapeutic intervention in order to improve the following deficits and impairments:  Pain, Decreased range of motion, Decreased strength, Decreased activity tolerance, Impaired perceived functional ability, Difficulty walking, Decreased balance  Visit Diagnosis: Stiffness of right knee, not elsewhere classified  Muscle weakness (generalized)  Chronic pain of right knee  Repeated falls     Problem List Patient Active Problem List   Diagnosis Date Noted  . Strain of left piriformis muscle 10/05/2018  . Pain due to onychomycosis of toenails of both feet 09/23/2018  . Chronic left SI joint pain 04/27/2018  . Right wrist pain 02/05/2018  . Degenerative arthritis of right knee 10/23/2017  . Left leg cellulitis 08/12/2017  . Arthritis of right knee 05/20/2017  . Diverticulosis of colon with hemorrhage   . Acute blood loss anemia   . GIB (gastrointestinal bleeding) 03/12/2017  . Leg edema, right 02/20/2015  . BPH (benign prostatic hyperplasia) 12/09/2014  . Chronic radicular low back pain 08/06/2012  . COPD (chronic obstructive pulmonary disease) ([place]) 04/21/2012  . Syncope 03/29/2012  . Hyponatremia 03/29/2012  . Localized osteoarthrosis not specified whether primary or secondary, lower leg 06/20/2010  . VARICOSE VEINS LOWER EXTREMITIES W/INFLAMMATION 02/12/2010  . [place] LOCALIZED OSTEOARTHROSIS INVOLVING HAND 10/16/2009  . DUPUYTREN'S CONTRACTURE, RIGHT 07/04/2009  . DEGENERATIVE DISC DISEASE, LUMBOSACRAL SPINE W/RADICULOPATHY 06/19/2009  . CARCINOMA, BASAL CELL 02/07/2009  . ANEMIA OF CHRONIC DISEASE 02/07/2009  . BURSITIS, RIGHT SHOULDER 10/07/2008  . CATARACT ASSOCIATED WITH OTHER SYNDROMES 08/01/2008  . DRY SKIN 08/01/2008  . CERUMEN IMPACTION, BILATERAL 03/01/2008  . PMR (polymyalgia rheumatica) ([place]) 01/18/2008  .  CRAMP IN LIMB 01/18/2008  . DEPRESSION, SITUATIONAL, ACUTE 12/28/2007  . CONSTIPATION, DRUG INDUCED 12/28/2007  . POLYNEUROPATHY OTHER DISEASES CLASSIFIED ELSW 09/24/2007  . WEIGHT LOSS, ABNORMAL 01/07/2007  . Hyperlipidemia 09/22/2006  . Essential hypertension 09/22/2006  . GERD (gastroesophageal reflux disease) 09/22/2006  . Osteoarthritis 09/22/2006   Ruben Im, PT 11/24/18 5:55 PM Phone: [phone] Fax: [phone] Alvera Singh 11/24/2018, 5:54 PM  [facility] [address] [place], [place], [address] Phone: [phone]   Fax:  [phone]  Name: Kris Teasdale MRN: [id number] Date of Birth: May 27, 1931

## 2018-11-26 ENCOUNTER — Encounter: Payer: Self-pay | Admitting: Physical Therapy

## 2018-11-26 ENCOUNTER — Other Ambulatory Visit: Payer: Self-pay

## 2018-11-26 ENCOUNTER — Ambulatory Visit: Payer: Medicare Other | Admitting: Physical Therapy

## 2018-11-26 DIAGNOSIS — M6281 Muscle weakness (generalized): Secondary | ICD-10-CM | POA: Diagnosis not present

## 2018-11-26 DIAGNOSIS — R296 Repeated falls: Secondary | ICD-10-CM | POA: Diagnosis not present

## 2018-11-26 DIAGNOSIS — G8929 Other chronic pain: Secondary | ICD-10-CM

## 2018-11-26 DIAGNOSIS — M25661 Stiffness of right knee, not elsewhere classified: Secondary | ICD-10-CM

## 2018-11-26 DIAGNOSIS — M25561 Pain in right knee: Secondary | ICD-10-CM | POA: Diagnosis not present

## 2018-11-26 NOTE — Therapy (Signed)
[facility] [address], [place], [place], [address] Phone: [phone]   Fax:  [phone]  Physical Therapy Treatment  Patient Details  Name: Darin [name] MRN: [id number] Date of Birth: 05-May-1931 Referring Provider (PT): Dr. Hulan Saas    Encounter Date: 11/26/2018  PT End of Session - 11/26/18 0732    Visit Number  55    Date for PT Re-Evaluation  12/08/18    Authorization Type  Medicare     PT Start Time  0730    PT Stop Time  0812    PT Time Calculation (min)  42 min    Activity Tolerance  Patient tolerated treatment well       Past Medical History:  Diagnosis Date  . Arthritis   . GERD (gastroesophageal reflux disease)   . H/O hiatal hernia    "FOR YEARS" " NO PROBLEM"  . Hyperlipidemia   . Hypertension   . Knee pain   . Neuropathy    PERIPHERAL   . Osteoporosis   . PMR (polymyalgia rheumatica) (HCC)   . Polymyalgia ([place]) 5 YEARS    Past Surgical History:  Procedure Laterality Date  . CARPAL TUNNEL RELEASE    . CATARACT EXTRACTION     right  . COLONOSCOPY  2007  . INGUINAL HERNIA REPAIR  10/14/2011   Procedure: HERNIA REPAIR INGUINAL ADULT;  Surgeon: [name], MD;  Location: [facility];  Service: General;  Laterality: Right;  Right Inguinal Hernia Repair with Mesh  . TONSILLECTOMY    . VARICOSE VEIN SURGERY      There were no vitals filed for this visit.  Subjective Assessment - 11/26/18 0736    Subjective  I did fine after last time.  My hip hurt during the night and I couldn't get comfortable.    Currently in Pain?  Yes    Pain Score  6     Pain Location  Knee    Pain Orientation  Right    Pain Score  6    Pain Location  Hip    Pain Orientation  Left                       [facility] Adult PT Treatment/Exercise - 11/26/18 0001      Therapeutic Activites    ADL's  steps, walking, standing, sit to stand       Knee/Hip Exercises: Aerobic   Nustep  seat 13 arms  11; L3 x15 min    therapist discussing status and response to treatment      Knee/Hip Exercises: Machines for Strengthening   Cybex Leg Press  Seat 9: #110 2x50 reps BLE (pt only completing through partial range); single leg #45 x50 reps each      Knee/Hip Exercises: Standing   Forward Step Up  Right;Left;15 reps;Hand Hold: 2;Step Height: 6"    Other Standing Knee Exercises  ladder walk with RW 4 laps     Other Standing Knee Exercises  stepping 6 inch high obstacle 5x right/left needs bil UE support       Knee/Hip Exercises: Seated   Sit to Sand  2 sets;10 reps   from very high table holding 10# weight               PT Short Term Goals - 10/27/18 [id number]      PT SHORT TERM GOAL #1   Title  be independent in initial HEP  Status  Achieved      PT SHORT TERM GOAL #2   Title  The patient will have improved right knee ROM 3-120 degrees needed for greater ease getting up and down from the chair and in/out of the car      PT [place] #3   Title  The patient will have improved hip flexor muscle length to 5 degrees and HS length to 65 degrees needed for improved stride length with ambulation    Status  Achieved      PT SHORT TERM GOAL #4   Title  Timed up and Go to 16 sec indicating improved gait safety      PT SHORT TERM GOAL #5   Title  Ten meter walk test to 14 sec indicate improved gait speed needed for community ambulation         PT Long Term Goals - 11/24/18 1753      PT LONG TERM GOAL #1   Title  be independent in advanced HEP and return to gym program to prevent declines in strength, ROM and balance    Time  6    Period  Weeks    Status  On-going      PT LONG TERM GOAL #2   Title  The patient will be able to walk to the mailbox and to the curb to get the newspaper 150 feet each way with knee and hip pain 5/10 or less    Status  Achieved      PT LONG TERM GOAL #3   Title  Improved bil hip abduction, extension and right knee extension strength to 4/5  needed for greater ease with sit to stand and walking/standing    Time  6    Period  Weeks    Status  On-going      PT LONG TERM GOAL #4   Title  The patient will be able to walk 10 m in < 17 sec without looking at the ground    Time  6    Period  Weeks    Status  On-going      PT LONG TERM GOAL #5   Title  The patient will be able to ambulate 370 feet in six minutes with RW needed for community ambulation    Status  Achieved      PT LONG TERM GOAL #6   Title  Timed up and go test of 19 sec or less indicating improved LE strength and gait speed    Status  Achieved            Plan - 11/26/18 0740    Clinical Impression Statement  The patient has improved step length and decreased shuffling with floor ladder.  He requires periodic seated rest breaks but is able to continue with a progression of loaded strengthening.  Knee and hip pain remain constant at 6/10 but no worse following treatment session.  Therapist monitoring response and providing supervision for safety.    Personal Factors and Comorbidities  Age;Past/Current Experience;Comorbidity 2;Comorbidity 3+;Time since onset of injury/illness/exacerbation    Examination-Activity Limitations  Locomotion Level    Rehab Potential  Good    Clinical Impairments Affecting Rehab Potential  risk for falls    PT Frequency  2x / week    PT Duration  6 weeks    PT Treatment/Interventions  ADLs/Self Care Home Management;Cryotherapy;Electrical Stimulation;Ultrasound;Moist Heat;Iontophoresis 4mg /ml Dexamethasone;Gait training;Stair training;Therapeutic activities;Therapeutic exercise;Patient/family education;Neuromuscular re-education;Manual techniques;Taping;Dry needling    PT Next  Visit Plan  KX; walk laps every visit with RW  gait endurance;  LE strengthening progression;  obstacle negotiation;  step ups    PT Home Exercise Plan  Access Code: [email or web address]        Patient will benefit from skilled therapeutic intervention in order to improve  the following deficits and impairments:  Pain, Decreased range of motion, Decreased strength, Decreased activity tolerance, Impaired perceived functional ability, Difficulty walking, Decreased balance  Visit Diagnosis: Stiffness of right knee, not elsewhere classified  Muscle weakness (generalized)  Chronic pain of right knee  Repeated falls     Problem List Patient Active Problem List   Diagnosis Date Noted  . Strain of left piriformis muscle 10/05/2018  . Pain due to onychomycosis of toenails of both feet 09/23/2018  . Chronic left SI joint pain 04/27/2018  . Right wrist pain 02/05/2018  . Degenerative arthritis of right knee 10/23/2017  . Left leg cellulitis 08/12/2017  . Arthritis of right knee 05/20/2017  . Diverticulosis of colon with hemorrhage   . Acute blood loss anemia   . GIB (gastrointestinal bleeding) 03/12/2017  . Leg edema, right 02/20/2015  . BPH (benign prostatic hyperplasia) 12/09/2014  . Chronic radicular low back pain 08/06/2012  . COPD (chronic obstructive pulmonary disease) ([place]) 04/21/2012  . Syncope 03/29/2012  . Hyponatremia 03/29/2012  . Localized osteoarthrosis not specified whether primary or secondary, lower leg 06/20/2010  . VARICOSE VEINS LOWER EXTREMITIES W/INFLAMMATION 02/12/2010  . [place] LOCALIZED OSTEOARTHROSIS INVOLVING HAND 10/16/2009  . DUPUYTREN'S CONTRACTURE, RIGHT 07/04/2009  . DEGENERATIVE DISC DISEASE, LUMBOSACRAL SPINE W/RADICULOPATHY 06/19/2009  . CARCINOMA, BASAL CELL 02/07/2009  . ANEMIA OF CHRONIC DISEASE 02/07/2009  . BURSITIS, RIGHT SHOULDER 10/07/2008  . CATARACT ASSOCIATED WITH OTHER SYNDROMES 08/01/2008  . DRY SKIN 08/01/2008  . CERUMEN IMPACTION, BILATERAL 03/01/2008  . PMR (polymyalgia rheumatica) ([place]) 01/18/2008  . CRAMP IN LIMB 01/18/2008  . DEPRESSION, SITUATIONAL, ACUTE 12/28/2007  . CONSTIPATION, DRUG INDUCED 12/28/2007  . POLYNEUROPATHY OTHER DISEASES CLASSIFIED ELSW 09/24/2007  . WEIGHT LOSS, ABNORMAL  01/07/2007  . Hyperlipidemia 09/22/2006  . Essential hypertension 09/22/2006  . GERD (gastroesophageal reflux disease) 09/22/2006  . Osteoarthritis 09/22/2006   Ruben Im, PT 11/26/18 8:09 AM Phone: [phone] Fax: [phone] Alvera Singh 11/26/2018, 8:08 AM  [facility] [address], [place], [place], [address] Phone: [phone]   Fax:  [phone]  Name: Darin [name] MRN: [id number] Date of Birth: 15-Oct-1931

## 2018-12-01 ENCOUNTER — Other Ambulatory Visit: Payer: Self-pay

## 2018-12-01 ENCOUNTER — Encounter: Payer: Self-pay | Admitting: Physical Therapy

## 2018-12-01 ENCOUNTER — Ambulatory Visit: Payer: Medicare Other | Admitting: Physical Therapy

## 2018-12-01 DIAGNOSIS — G8929 Other chronic pain: Secondary | ICD-10-CM

## 2018-12-01 DIAGNOSIS — M25561 Pain in right knee: Secondary | ICD-10-CM

## 2018-12-01 DIAGNOSIS — M6281 Muscle weakness (generalized): Secondary | ICD-10-CM

## 2018-12-01 DIAGNOSIS — R296 Repeated falls: Secondary | ICD-10-CM

## 2018-12-01 DIAGNOSIS — M25661 Stiffness of right knee, not elsewhere classified: Secondary | ICD-10-CM | POA: Diagnosis not present

## 2018-12-01 NOTE — Therapy (Signed)
[facility] [address] [place], [place], [address] Phone: [phone]   Fax:  [phone]  Physical Therapy Treatment  Patient Details  Name: Darin [name] MRN: [id number] Date of Birth: [birth year] Referring Provider (PT): Dr. Hulan Saas    Encounter Date: 12/01/2018  PT End of Session - 12/01/18 1048    Visit Number  50    Date for PT Re-Evaluation  12/08/18    Authorization Type  Medicare     PT Start Time  1033    PT Stop Time  1115    PT Time Calculation (min)  42 min    Activity Tolerance  Patient tolerated treatment well       Past Medical History:  Diagnosis Date  . Arthritis   . GERD (gastroesophageal reflux disease)   . H/O hiatal hernia    "FOR YEARS" " NO PROBLEM"  . Hyperlipidemia   . Hypertension   . Knee pain   . Neuropathy    PERIPHERAL   . Osteoporosis   . PMR (polymyalgia rheumatica) (HCC)   . Polymyalgia ([place]) 5 YEARS    Past Surgical History:  Procedure Laterality Date  . CARPAL TUNNEL RELEASE    . CATARACT EXTRACTION     right  . COLONOSCOPY  2007  . INGUINAL HERNIA REPAIR  10/14/2011   Procedure: HERNIA REPAIR INGUINAL ADULT;  Surgeon: [name], MD;  Location: [facility];  Service: General;  Laterality: Right;  Right Inguinal Hernia Repair with Mesh  . TONSILLECTOMY    . VARICOSE VEIN SURGERY      There were no vitals filed for this visit.  Subjective Assessment - 12/01/18 1037    Currently in Pain?  Yes    Pain Score  6     Pain Location  Knee    Pain Orientation  Right    Pain Score  6    Pain Location  Hip    Pain Orientation  Left                       [facility] Adult PT Treatment/Exercise - 12/01/18 0001      Therapeutic Activites    ADL's  steps, walking, standing, sit to stand       Knee/Hip Exercises: Aerobic   Nustep  seat 13 arms 11; L3 x15 min    therapist discussing status and response to treatment      Knee/Hip Exercises:  Machines for Strengthening   Cybex Leg Press  Seat 9: #110 2x50 reps BLE (pt only completing through partial range); single leg #45 x50 reps each (pt completed through greater range with this)       Knee/Hip Exercises: Standing   Hip Abduction  Stengthening;Right;Left;1 set;10 reps    Abduction Limitations  2#     Hip Extension  Stengthening;Right;Left;1 set;10 reps    Extension Limitations  2#     Forward Step Up  Right;Left;10 reps;Hand Hold: 2;Step Height: 6"    Other Standing Knee Exercises  step taps with 2# weights 10x     Other Standing Knee Exercises  avoiding boxes on floor 6 laps       Knee/Hip Exercises: Seated   Sit to Sand  2 sets;10 reps   from very high table holding 10# weight               PT Short Term Goals - 10/27/18 [id number]      PT SHORT TERM  GOAL #1   Title  be independent in initial HEP      Status  Achieved      PT SHORT TERM GOAL #2   Title  The patient will have improved right knee ROM 3-120 degrees needed for greater ease getting up and down from the chair and in/out of the car      PT [place] #3   Title  The patient will have improved hip flexor muscle length to 5 degrees and HS length to 65 degrees needed for improved stride length with ambulation    Status  Achieved      PT SHORT TERM GOAL #4   Title  Timed up and Go to 16 sec indicating improved gait safety      PT SHORT TERM GOAL #5   Title  Ten meter walk test to 14 sec indicate improved gait speed needed for community ambulation         PT Long Term Goals - 11/24/18 1753      PT LONG TERM GOAL #1   Title  be independent in advanced HEP and return to gym program to prevent declines in strength, ROM and balance    Time  6    Period  Weeks    Status  On-going      PT LONG TERM GOAL #2   Title  The patient will be able to walk to the mailbox and to the curb to get the newspaper 150 feet each way with knee and hip pain 5/10 or less    Status  Achieved      PT LONG TERM GOAL  #3   Title  Improved bil hip abduction, extension and right knee extension strength to 4/5 needed for greater ease with sit to stand and walking/standing    Time  6    Period  Weeks    Status  On-going      PT LONG TERM GOAL #4   Title  The patient will be able to walk 10 m in < 17 sec without looking at the ground    Time  6    Period  Weeks    Status  On-going      PT LONG TERM GOAL #5   Title  The patient will be able to ambulate 370 feet in six minutes with RW needed for community ambulation    Status  Achieved      PT LONG TERM GOAL #6   Title  Timed up and go test of 19 sec or less indicating improved LE strength and gait speed    Status  Achieved            Plan - 12/01/18 1052    Clinical Impression Statement  The patient is able to perform a additional resistive/loaded strengthening in the standing position.  He needs occasional rest breaks for shortness of breath more related to the mask vs. increased pain.   With floor obstables, he has much improved stride length and better initiation of movement.  Therapist closely monitoring response to all interventions.    PT Frequency  2x / week    PT Duration  6 weeks    PT Treatment/Interventions  ADLs/Self Care Home Management;Cryotherapy;Electrical Stimulation;Ultrasound;Moist Heat;Iontophoresis 4mg /ml Dexamethasone;Gait training;Stair training;Therapeutic activities;Therapeutic exercise;Patient/family education;Neuromuscular re-education;Manual techniques;Taping;Dry needling    PT Next Visit Plan  KX;  ERO in 1 week;   walk laps every visit with RW  gait endurance;  LE strengthening progression;  obstacle negotiation;  step ups    PT Home Exercise Plan  Access Code: [email or web address]        Patient will benefit from skilled therapeutic intervention in order to improve the following deficits and impairments:  Pain, Decreased range of motion, Decreased strength, Decreased activity tolerance, Impaired perceived functional ability,  Difficulty walking, Decreased balance  Visit Diagnosis: Stiffness of right knee, not elsewhere classified  Muscle weakness (generalized)  Chronic pain of right knee  Repeated falls     Problem List Patient Active Problem List   Diagnosis Date Noted  . Strain of left piriformis muscle 10/05/2018  . Pain due to onychomycosis of toenails of both feet 09/23/2018  . Chronic left SI joint pain 04/27/2018  . Right wrist pain 02/05/2018  . Degenerative arthritis of right knee 10/23/2017  . Left leg cellulitis 08/12/2017  . Arthritis of right knee 05/20/2017  . Diverticulosis of colon with hemorrhage   . Acute blood loss anemia   . GIB (gastrointestinal bleeding) 03/12/2017  . Leg edema, right 02/20/2015  . BPH (benign prostatic hyperplasia) 12/09/2014  . Chronic radicular low back pain 08/06/2012  . COPD (chronic obstructive pulmonary disease) ([place]) 04/21/2012  . Syncope 03/29/2012  . Hyponatremia 03/29/2012  . Localized osteoarthrosis not specified whether primary or secondary, lower leg 06/20/2010  . VARICOSE VEINS LOWER EXTREMITIES W/INFLAMMATION 02/12/2010  . [place] LOCALIZED OSTEOARTHROSIS INVOLVING HAND 10/16/2009  . DUPUYTREN'S CONTRACTURE, RIGHT 07/04/2009  . DEGENERATIVE DISC DISEASE, LUMBOSACRAL SPINE W/RADICULOPATHY 06/19/2009  . CARCINOMA, BASAL CELL 02/07/2009  . ANEMIA OF CHRONIC DISEASE 02/07/2009  . BURSITIS, RIGHT SHOULDER 10/07/2008  . CATARACT ASSOCIATED WITH OTHER SYNDROMES 08/01/2008  . DRY SKIN 08/01/2008  . CERUMEN IMPACTION, BILATERAL 03/01/2008  . PMR (polymyalgia rheumatica) ([place]) 01/18/2008  . CRAMP IN LIMB 01/18/2008  . DEPRESSION, SITUATIONAL, ACUTE 12/28/2007  . CONSTIPATION, DRUG INDUCED 12/28/2007  . POLYNEUROPATHY OTHER DISEASES CLASSIFIED ELSW 09/24/2007  . WEIGHT LOSS, ABNORMAL 01/07/2007  . Hyperlipidemia 09/22/2006  . Essential hypertension 09/22/2006  . GERD (gastroesophageal reflux disease) 09/22/2006  . Osteoarthritis 09/22/2006    Ruben Im, PT 12/01/18 11:12 AM Phone: [phone] Fax: [phone] Alvera Singh 12/01/2018, 11:11 AM  [facility] [address], [place], [place], [address] Phone: [phone]   Fax:  [phone]  Name: Darin [name] MRN: [id number] Date of Birth: Dec 17, 1931

## 2018-12-03 ENCOUNTER — Encounter: Payer: Self-pay | Admitting: Physical Therapy

## 2018-12-03 ENCOUNTER — Ambulatory Visit: Payer: Medicare Other | Admitting: Physical Therapy

## 2018-12-03 ENCOUNTER — Other Ambulatory Visit: Payer: Self-pay

## 2018-12-03 DIAGNOSIS — G8929 Other chronic pain: Secondary | ICD-10-CM

## 2018-12-03 DIAGNOSIS — M25561 Pain in right knee: Secondary | ICD-10-CM | POA: Diagnosis not present

## 2018-12-03 DIAGNOSIS — M6281 Muscle weakness (generalized): Secondary | ICD-10-CM | POA: Diagnosis not present

## 2018-12-03 DIAGNOSIS — M25661 Stiffness of right knee, not elsewhere classified: Secondary | ICD-10-CM

## 2018-12-03 DIAGNOSIS — R296 Repeated falls: Secondary | ICD-10-CM | POA: Diagnosis not present

## 2018-12-03 NOTE — Therapy (Signed)
[facility] [address], [place], [place], [address] Phone: [phone]   Fax:  [phone]  Physical Therapy Treatment  Patient Details  Name: Darin [name] MRN: [id number] Date of Birth: [birth year] Referring Provider (PT): Dr. Hulan Saas    Encounter Date: 12/03/2018  PT End of Session - 12/03/18 0740    Visit Number  61    Date for PT Re-Evaluation  12/08/18    Authorization Type  Medicare     PT Start Time  0730    PT Stop Time  0815    PT Time Calculation (min)  45 min    Activity Tolerance  Patient tolerated treatment well       Past Medical History:  Diagnosis Date  . Arthritis   . GERD (gastroesophageal reflux disease)   . H/O hiatal hernia    "FOR YEARS" " NO PROBLEM"  . Hyperlipidemia   . Hypertension   . Knee pain   . Neuropathy    PERIPHERAL   . Osteoporosis   . PMR (polymyalgia rheumatica) (HCC)   . Polymyalgia ([place]) 5 YEARS    Past Surgical History:  Procedure Laterality Date  . CARPAL TUNNEL RELEASE    . CATARACT EXTRACTION     right  . COLONOSCOPY  2007  . INGUINAL HERNIA REPAIR  10/14/2011   Procedure: HERNIA REPAIR INGUINAL ADULT;  Surgeon: [name], MD;  Location: [place];  Service: General;  Laterality: Right;  Right Inguinal Hernia Repair with Mesh  . TONSILLECTOMY    . VARICOSE VEIN SURGERY      There were no vitals filed for this visit.  Subjective Assessment - 12/03/18 0734    Subjective  I'm doing pretty good, all things considered.    Currently in Pain?  Yes    Pain Score  6     Pain Location  Knee    Pain Orientation  Right    Pain Type  Chronic pain    Pain Score  6    Pain Location  Knee    Pain Orientation  Left    Pain Type  Chronic pain                       [facility] Adult PT Treatment/Exercise - 12/03/18 0001      Ambulation/Gait   Gait Comments  2 minutes with focus on eyes ahead and longer steps       Therapeutic Activites     ADL's  steps, walking, standing, sit to stand; lifting      Knee/Hip Exercises: Aerobic   Nustep  seat 13 arms 11; L3 x15 min    therapist discussing status and response to treatment      Knee/Hip Exercises: Machines for Strengthening   Cybex Leg Press  Seat 9: #110 2x50 reps BLE (pt only completing through partial range); single leg #45 100 reps each (pt completed through greater range with this)       Knee/Hip Exercises: Standing   Other Standing Knee Exercises  step over and back over obstacle; side step over obstacles     Other Standing Knee Exercises  dead lift 10# from seat of chair 5x2       Knee/Hip Exercises: Seated   Sit to Sand  2 sets;10 reps   from very high table holding 10# weight               PT Short Term Goals - 10/27/18  0822      PT SHORT TERM GOAL #1   Title  be independent in initial HEP      Status  Achieved      PT SHORT TERM GOAL #2   Title  The patient will have improved right knee ROM 3-120 degrees needed for greater ease getting up and down from the chair and in/out of the car      PT [place] #3   Title  The patient will have improved hip flexor muscle length to 5 degrees and HS length to 65 degrees needed for improved stride length with ambulation    Status  Achieved      PT SHORT TERM GOAL #4   Title  Timed up and Go to 16 sec indicating improved gait safety      PT SHORT TERM GOAL #5   Title  Ten meter walk test to 14 sec indicate improved gait speed needed for community ambulation         PT Long Term Goals - 11/24/18 1753      PT LONG TERM GOAL #1   Title  be independent in advanced HEP and return to gym program to prevent declines in strength, ROM and balance    Time  6    Period  Weeks    Status  On-going      PT LONG TERM GOAL #2   Title  The patient will be able to walk to the mailbox and to the curb to get the newspaper 150 feet each way with knee and hip pain 5/10 or less    Status  Achieved      PT LONG  TERM GOAL #3   Title  Improved bil hip abduction, extension and right knee extension strength to 4/5 needed for greater ease with sit to stand and walking/standing    Time  6    Period  Weeks    Status  On-going      PT LONG TERM GOAL #4   Title  The patient will be able to walk 10 m in < 17 sec without looking at the ground    Time  6    Period  Weeks    Status  On-going      PT LONG TERM GOAL #5   Title  The patient will be able to ambulate 370 feet in six minutes with RW needed for community ambulation    Status  Achieved      PT LONG TERM GOAL #6   Title  Timed up and go test of 19 sec or less indicating improved LE strength and gait speed    Status  Achieved            Plan - 12/03/18 0745    Clinical Impression Statement  The patient reports general fatigue with loaded, resisted strengthening but no increase in hip or knee pain.  He has some dizziness with maneuvers where his head moves side to side for up/down.  Frequent cues to hold head up while ambulating.  Therapist closely  monitoring response with all and providing supervision for safety when standing/ambulating secondary to fall risk.    Rehab Potential  Good    Clinical Impairments Affecting Rehab Potential  risk for falls    PT Frequency  2x / week    PT Duration  6 weeks    PT Treatment/Interventions  ADLs/Self Care Home Management;Cryotherapy;Electrical Stimulation;Ultrasound;Moist Heat;Iontophoresis 4mg /ml Dexamethasone;Gait training;Stair training;Therapeutic activities;Therapeutic exercise;Patient/family education;Neuromuscular  re-education;Manual techniques;Taping;Dry needling    PT Next Visit Plan  KX;  ERO next visit;   walk laps every visit with RW  gait endurance;  LE strengthening progression;  obstacle negotiation;  step ups; dead lifting   PT Home Exercise Plan  Access Code: [email or web address]        Patient will benefit from skilled therapeutic intervention in order to improve the following deficits and  impairments:  Pain, Decreased range of motion, Decreased strength, Decreased activity tolerance, Impaired perceived functional ability, Difficulty walking, Decreased balance  Visit Diagnosis: Stiffness of right knee, not elsewhere classified  Muscle weakness (generalized)  Chronic pain of right knee  Repeated falls     Problem List Patient Active Problem List   Diagnosis Date Noted  . Strain of left piriformis muscle 10/05/2018  . Pain due to onychomycosis of toenails of both feet 09/23/2018  . Chronic left SI joint pain 04/27/2018  . Right wrist pain 02/05/2018  . Degenerative arthritis of right knee 10/23/2017  . Left leg cellulitis 08/12/2017  . Arthritis of right knee 05/20/2017  . Diverticulosis of colon with hemorrhage   . Acute blood loss anemia   . GIB (gastrointestinal bleeding) 03/12/2017  . Leg edema, right 02/20/2015  . BPH (benign prostatic hyperplasia) 12/09/2014  . Chronic radicular low back pain 08/06/2012  . COPD (chronic obstructive pulmonary disease) ([place]) 04/21/2012  . Syncope 03/29/2012  . Hyponatremia 03/29/2012  . Localized osteoarthrosis not specified whether primary or secondary, lower leg 06/20/2010  . VARICOSE VEINS LOWER EXTREMITIES W/INFLAMMATION 02/12/2010  . [place] LOCALIZED OSTEOARTHROSIS INVOLVING HAND 10/16/2009  . DUPUYTREN'S CONTRACTURE, RIGHT 07/04/2009  . DEGENERATIVE DISC DISEASE, LUMBOSACRAL SPINE W/RADICULOPATHY 06/19/2009  . CARCINOMA, BASAL CELL 02/07/2009  . ANEMIA OF CHRONIC DISEASE 02/07/2009  . BURSITIS, RIGHT SHOULDER 10/07/2008  . CATARACT ASSOCIATED WITH OTHER SYNDROMES 08/01/2008  . DRY SKIN 08/01/2008  . CERUMEN IMPACTION, BILATERAL 03/01/2008  . PMR (polymyalgia rheumatica) ([place]) 01/18/2008  . CRAMP IN LIMB 01/18/2008  . DEPRESSION, SITUATIONAL, ACUTE 12/28/2007  . CONSTIPATION, DRUG INDUCED 12/28/2007  . POLYNEUROPATHY OTHER DISEASES CLASSIFIED ELSW 09/24/2007  . WEIGHT LOSS, ABNORMAL 01/07/2007  . Hyperlipidemia  09/22/2006  . Essential hypertension 09/22/2006  . GERD (gastroesophageal reflux disease) 09/22/2006  . Osteoarthritis 09/22/2006   Ruben Im, PT 12/03/18 8:21 AM Phone: [phone] Fax: [phone] Alvera Singh 12/03/2018, 8:20 AM  [facility] [address], [place], [place], [address] Phone: [phone]   Fax:  [phone]  Name: Airen Motte MRN: [id number] Date of Birth: 1931-06-19

## 2018-12-05 ENCOUNTER — Other Ambulatory Visit: Payer: Self-pay | Admitting: Family Medicine

## 2018-12-08 ENCOUNTER — Ambulatory Visit: Payer: Medicare Other | Admitting: Physical Therapy

## 2018-12-08 ENCOUNTER — Other Ambulatory Visit: Payer: Self-pay

## 2018-12-08 ENCOUNTER — Encounter: Payer: Self-pay | Admitting: Physical Therapy

## 2018-12-08 DIAGNOSIS — M25661 Stiffness of right knee, not elsewhere classified: Secondary | ICD-10-CM

## 2018-12-08 DIAGNOSIS — G8929 Other chronic pain: Secondary | ICD-10-CM | POA: Diagnosis not present

## 2018-12-08 DIAGNOSIS — R296 Repeated falls: Secondary | ICD-10-CM | POA: Diagnosis not present

## 2018-12-08 DIAGNOSIS — M25561 Pain in right knee: Secondary | ICD-10-CM | POA: Diagnosis not present

## 2018-12-08 DIAGNOSIS — M6281 Muscle weakness (generalized): Secondary | ICD-10-CM

## 2018-12-08 NOTE — Therapy (Signed)
[facility] [address] [place], [place], [address] Phone: [phone]   Fax:  [phone]  Physical Therapy Treatment/Recertification   Patient Details  Name: Darin [name] MRN: [id number] Date of Birth: 17-Mar-1932 Referring Provider (PT): Dr. Hulan Saas    Encounter Date: 12/08/2018  PT End of Session - 12/08/18 1014    Visit Number  45    Date for PT Re-Evaluation  01/05/19    Authorization Type  Medicare     PT Start Time  1000    PT Stop Time  1050   pt in bathroom part of the time   PT Time Calculation (min)  50 min    Activity Tolerance  Patient tolerated treatment well       Past Medical History:  Diagnosis Date  . Arthritis   . GERD (gastroesophageal reflux disease)   . H/O hiatal hernia    "FOR YEARS" " NO PROBLEM"  . Hyperlipidemia   . Hypertension   . Knee pain   . Neuropathy    PERIPHERAL   . Osteoporosis   . PMR (polymyalgia rheumatica) (HCC)   . Polymyalgia ([place]) 5 YEARS    Past Surgical History:  Procedure Laterality Date  . CARPAL TUNNEL RELEASE    . CATARACT EXTRACTION     right  . COLONOSCOPY  2007  . INGUINAL HERNIA REPAIR  10/14/2011   Procedure: HERNIA REPAIR INGUINAL ADULT;  Surgeon: [name], MD;  Location: [place];  Service: General;  Laterality: Right;  Right Inguinal Hernia Repair with Mesh  . TONSILLECTOMY    . VARICOSE VEIN SURGERY      There were no vitals filed for this visit.  Subjective Assessment - 12/08/18 1001    Subjective  [employer] started billing me last week.  I cancelled it.  Nothing changes with my knee and hip.  [employer] days start next week.    Currently in Pain?  Yes    Pain Score  6     Pain Location  Knee    Pain Orientation  Right    Multiple Pain Sites  Yes    Pain Score  5    Pain Location  Hip    Pain Orientation  Left         [facility] PT Assessment - 12/08/18 0001      Strength   Right Hip Flexion  4/5    Right Hip  Extension  4/5    Left Hip Flexion  4+/5    Left Hip Extension  4+/5    Left Hip ABduction  4/5    Right Knee Flexion  4/5    Right Knee Extension  4/5    Left Knee Flexion  4+/5    Left Knee Extension  4+/5      6 Minute Walk- Baseline   Perceived Rate of Exertion ([place])  7- Very, very light      6 minute walk test results    Aerobic Endurance Distance Walked  480    Endurance additional comments  RW      Standardized Balance Assessment   10 Meter Walk  19.88      Timed Up and Go Test   Normal TUG (seconds)  19.03   with foam                  [facility] Adult PT Treatment/Exercise - 12/08/18 0001      Knee/Hip Exercises: Aerobic   Nustep  seat 13 arms 11; L3 x15 min    therapist discussing status and response to treatment      Knee/Hip Exercises: Machines for Strengthening   Cybex Leg Press  --      Knee/Hip Exercises: Standing   Hip Abduction  Right;Left;10 reps    Abduction Limitations  green band    Hip Extension  Stengthening;Right;Left;1 set;10 reps    Extension Limitations  green band     Other Standing Knee Exercises  holding 10# sit to stand from very high table     Other Standing Knee Exercises  dead lift 10# from seat of chair 5x2              PT Education - 12/08/18 1432    Education Details  discussed community ex options for gyms /ex programs    Person(s) Educated  Patient    Methods  Explanation    Comprehension  Verbalized understanding       PT Short Term Goals - 12/08/18 1546      PT SHORT TERM GOAL #1   Title  be independent in initial HEP      Status  Achieved      PT [place] #2   Title  The patient will have improved right knee ROM 3-120 degrees needed for greater ease getting up and down from the chair and in/out of the car    Status  Partially Met      PT SHORT TERM GOAL #3   Title  The patient will have improved hip flexor muscle length to 5 degrees and HS length to 65 degrees needed for improved stride length  with ambulation    Status  Achieved      PT SHORT TERM GOAL #4   Title  Timed up and Go to 16 sec indicating improved gait safety    Status  Partially Met      PT SHORT TERM GOAL #5   Title  Ten meter walk test to 14 sec indicate improved gait speed needed for community ambulation     Status  Not Met        PT Long Term Goals - 12/08/18 1546      PT LONG TERM GOAL #1   Title  be independent in advanced HEP and return to gym program to prevent declines in strength, ROM and balance    Time  6    Period  Weeks    Status  On-going    [place] Date  01/05/19      PT LONG TERM GOAL #2   Title  The patient will be able to walk to the mailbox and to the curb to get the newspaper 150 feet each way with knee and hip pain 5/10 or less    Status  Achieved      PT LONG TERM GOAL #3   Title  Improved bil hip abduction, extension and right knee extension strength to 4/5 needed for greater ease with sit to stand and walking/standing    Status  Partially Met      PT LONG TERM GOAL #4   Title  The patient will be able to walk 10 m in < 17 sec without looking at the ground    Time  6    Period  Weeks    Status  On-going      PT LONG TERM GOAL #5   Title  The patient will be able to ambulate 370 feet  in six minutes with RW needed for community ambulation    Status  Achieved      PT LONG TERM GOAL #6   Title  Timed up and go test of 19 sec or less indicating improved LE strength and gait speed    Status  Achieved            Plan - 12/08/18 1432    Clinical Impression Statement  The patient has had limited improvement over the last several weeks but the patient's chronic condition and advanced age make it critical that he continue to perform a maintenance program to prevent or slow deterioration of mobiity.  The gyms in [place] have only recently reopened and we discussed available options.  He does not want to return to his  previous gym b/c he feels they have billed him fraudulently.  He  would benefit from up to 4 more weeks to allow time for him to find a community based gym option and to prepare him for independence from  current skilled care.  He needs verbal cues for step length and head positioning for ambulation, monitoring of symptoms and supervision for safety when standing/ambulating with challenges to balance.    Personal Factors and Comorbidities  Age;Past/Current Experience;Comorbidity 2;Comorbidity 3+;Time since onset of injury/illness/exacerbation    Rehab Potential  Good    Clinical Impairments Affecting Rehab Potential  risk for falls    PT Frequency  2x / week    PT Duration  4 weeks    PT Treatment/Interventions  ADLs/Self Care Home Management;Cryotherapy;Electrical Stimulation;Ultrasound;Moist Heat;Iontophoresis 70m/ml Dexamethasone;Gait training;Stair training;Therapeutic activities;Therapeutic exercise;Patient/family education;Neuromuscular re-education;Manual techniques;Taping;Dry needling    PT Next Visit Plan  KX;  walk laps every visit with RW  gait endurance;  LE strengthening progression;  obstacle negotiation;  step ups    PT Home Exercise Plan  Access Code: [email or web address]     Recommended Other Services  [employer]       Patient will benefit from skilled therapeutic intervention in order to improve the following deficits and impairments:  Pain, Decreased range of motion, Decreased strength, Decreased activity tolerance, Impaired perceived functional ability, Difficulty walking, Decreased balance  Visit Diagnosis: Stiffness of right knee, not elsewhere classified - Plan: PT plan of care cert/re-cert  Muscle weakness (generalized) - Plan: PT plan of care cert/re-cert  Chronic pain of right knee - Plan: PT plan of care cert/re-cert     Problem List Patient Active Problem List   Diagnosis Date Noted  . Strain of left piriformis muscle 10/05/2018  . Pain due to onychomycosis of toenails of both feet 09/23/2018  . Chronic left SI joint pain 04/27/2018   . Right wrist pain 02/05/2018  . Degenerative arthritis of right knee 10/23/2017  . Left leg cellulitis 08/12/2017  . Arthritis of right knee 05/20/2017  . Diverticulosis of colon with hemorrhage   . Acute blood loss anemia   . GIB (gastrointestinal bleeding) 03/12/2017  . Leg edema, right 02/20/2015  . BPH (benign prostatic hyperplasia) 12/09/2014  . Chronic radicular low back pain 08/06/2012  . COPD (chronic obstructive pulmonary disease) ([place] 04/21/2012  . Syncope 03/29/2012  . Hyponatremia 03/29/2012  . Localized osteoarthrosis not specified whether primary or secondary, lower leg 06/20/2010  . VARICOSE VEINS LOWER EXTREMITIES W/INFLAMMATION 02/12/2010  . [place] 10/16/2009  . DUPUYTREN'S CONTRACTURE, RIGHT 07/04/2009  . DEGENERATIVE DISC DISEASE, LUMBOSACRAL SPINE W/RADICULOPATHY 06/19/2009  . CARCINOMA, BASAL CELL 02/07/2009  . ANEMIA OF CHRONIC DISEASE 02/07/2009  .  BURSITIS, RIGHT SHOULDER 10/07/2008  . CATARACT ASSOCIATED WITH OTHER SYNDROMES 08/01/2008  . DRY SKIN 08/01/2008  . CERUMEN IMPACTION, BILATERAL 03/01/2008  . PMR (polymyalgia rheumatica) ([place]) 01/18/2008  . CRAMP IN LIMB 01/18/2008  . DEPRESSION, SITUATIONAL, ACUTE 12/28/2007  . CONSTIPATION, DRUG INDUCED 12/28/2007  . POLYNEUROPATHY OTHER DISEASES CLASSIFIED ELSW 09/24/2007  . WEIGHT LOSS, ABNORMAL 01/07/2007  . Hyperlipidemia 09/22/2006  . Essential hypertension 09/22/2006  . GERD (gastroesophageal reflux disease) 09/22/2006  . Osteoarthritis 09/22/2006   Ruben Im, PT 12/08/18 5:04 PM Phone: [phone] Fax: [phone] Alvera Singh 12/08/2018, 5:03 PM  [facility] [address], [place], [place], [address] Phone: [phone]   Fax:  [phone]  Name: Darin [name] MRN: [id number] Date of Birth: 1931/04/21

## 2018-12-10 ENCOUNTER — Other Ambulatory Visit: Payer: Self-pay

## 2018-12-10 ENCOUNTER — Encounter: Payer: Self-pay | Admitting: Physical Therapy

## 2018-12-10 ENCOUNTER — Ambulatory Visit: Payer: Medicare Other | Admitting: Physical Therapy

## 2018-12-10 DIAGNOSIS — R296 Repeated falls: Secondary | ICD-10-CM | POA: Diagnosis not present

## 2018-12-10 DIAGNOSIS — G8929 Other chronic pain: Secondary | ICD-10-CM

## 2018-12-10 DIAGNOSIS — M6281 Muscle weakness (generalized): Secondary | ICD-10-CM

## 2018-12-10 DIAGNOSIS — M25661 Stiffness of right knee, not elsewhere classified: Secondary | ICD-10-CM

## 2018-12-10 DIAGNOSIS — M25561 Pain in right knee: Secondary | ICD-10-CM | POA: Diagnosis not present

## 2018-12-10 NOTE — Therapy (Signed)
[facility] [address] [place], [place], [address] Phone: [phone]   Fax:  [phone]  Physical Therapy Treatment  Patient Details  Name: Darin [name] MRN: [id number] Date of Birth: 12-Jun-1931 Referring Provider (PT): Dr. Hulan Saas    Encounter Date: 12/10/2018  PT End of Session - 12/10/18 1001    Visit Number  16    Date for PT Re-Evaluation  01/05/19    Authorization Type  Medicare     PT Start Time  0815    PT Stop Time  0900    PT Time Calculation (min)  45 min    Activity Tolerance  Patient tolerated treatment well       Past Medical History:  Diagnosis Date  . Arthritis   . GERD (gastroesophageal reflux disease)   . H/O hiatal hernia    "FOR YEARS" " NO PROBLEM"  . Hyperlipidemia   . Hypertension   . Knee pain   . Neuropathy    PERIPHERAL   . Osteoporosis   . PMR (polymyalgia rheumatica) (HCC)   . Polymyalgia ([place]) 5 YEARS    Past Surgical History:  Procedure Laterality Date  . CARPAL TUNNEL RELEASE    . CATARACT EXTRACTION     right  . COLONOSCOPY  2007  . INGUINAL HERNIA REPAIR  10/14/2011   Procedure: HERNIA REPAIR INGUINAL ADULT;  Surgeon: [name], MD;  Location: [facility];  Service: General;  Laterality: Right;  Right Inguinal Hernia Repair with Mesh  . TONSILLECTOMY    . VARICOSE VEIN SURGERY      There were no vitals filed for this visit.  Subjective Assessment - 12/10/18 0817    Subjective  About the same.  I went over to [employer] yesterday afternoon.  I spoke at length to the owner and I'm going to start the following Monday after I finish here.    Currently in Pain?  Yes    Pain Score  6     Pain Location  Knee    Pain Orientation  Right    Pain Location  Hip    Pain Orientation  Left                       [facility] Adult PT Treatment/Exercise - 12/10/18 0001      Therapeutic Activites    ADL's  steps, walking, standing, sit to stand        Knee/Hip Exercises: Stretches   Hip Flexor Stretch Limitations  2nd step hip flexor stretch 10x right/left       Knee/Hip Exercises: Aerobic   Nustep  seat 13 arms 11; L3 x15 min    therapist discussing status and response to treatment      Knee/Hip Exercises: Machines for Strengthening   Cybex Leg Press  Seat 9: #110 2x50 reps BLE (pt only completing through partial range); single leg #45 100 reps each (pt completed through greater range with this)       Knee/Hip Exercises: Standing   Hip Abduction  Stengthening;Right;Left;1 set;10 reps    Abduction Limitations  2#     Hip Extension  Stengthening;Right;Left;1 set;10 reps    Extension Limitations  2#     Forward Step Up  Right;Left;10 reps;Hand Hold: 2;Step Height: 6"    Other Standing Knee Exercises  stepping up on black foam 30 sec     Other Standing Knee Exercises  sidestepping 30 sec  Knee/Hip Exercises: Seated   Other Seated Knee/Hip Exercises  chair sit ups holding a 10# plate weight 99I    Other Seated Knee/Hip Exercises  seated lift 10# 25x     Marching Limitations  2# weights on 30 sec                PT Short Term Goals - 12/08/18 1546      PT SHORT TERM GOAL #1   Title  be independent in initial HEP      Status  Achieved      PT SHORT TERM GOAL #2   Title  The patient will have improved right knee ROM 3-120 degrees needed for greater ease getting up and down from the chair and in/out of the car    Status  Partially Met      PT SHORT TERM GOAL #3   Title  The patient will have improved hip flexor muscle length to 5 degrees and HS length to 65 degrees needed for improved stride length with ambulation    Status  Achieved      PT SHORT TERM GOAL #4   Title  Timed up and Go to 16 sec indicating improved gait safety    Status  Partially Met      PT SHORT TERM GOAL #5   Title  Ten meter walk test to 14 sec indicate improved gait speed needed for community ambulation     Status  Not Met        PT  Long Term Goals - 12/08/18 1546      PT LONG TERM GOAL #1   Title  be independent in advanced HEP and return to gym program to prevent declines in strength, ROM and balance    Time  6    Period  Weeks    Status  On-going    Target Date  01/05/19      PT LONG TERM GOAL #2   Title  The patient will be able to walk to the mailbox and to the curb to get the newspaper 150 feet each way with knee and hip pain 5/10 or less    Status  Achieved      PT LONG TERM GOAL #3   Title  Improved bil hip abduction, extension and right knee extension strength to 4/5 needed for greater ease with sit to stand and walking/standing    Status  Partially Met      PT LONG TERM GOAL #4   Title  The patient will be able to walk 10 m in < 17 sec without looking at the ground    Time  6    Period  Weeks    Status  On-going      PT LONG TERM GOAL #5   Title  The patient will be able to ambulate 370 feet in six minutes with RW needed for community ambulation    Status  Achieved      PT LONG TERM GOAL #6   Title  Timed up and go test of 19 sec or less indicating improved LE strength and gait speed    Status  Achieved            Plan - 12/10/18 1002    Clinical Impression Statement  The patient is able to perform some "loaded" resistive strengthening without an increase in knee or hip pain.  Improved 6 inch step up tolerance on right.  He met with the owner of [employer] (which is only reopened secondary to state Covid restrictions) yesterday and plans to begin there in October as he completes his course of PT.  Therapist monitoring response to interventions and providing close supervision for safety.  He needs rest breaks between standing exs secondary dizziness.    Rehab Potential  Good    Clinical Impairments Affecting Rehab Potential  risk for falls    PT Frequency  2x / week    PT Duration  4 weeks    PT Treatment/Interventions  ADLs/Self Care Home Management;Cryotherapy;Electrical  Stimulation;Ultrasound;Moist Heat;Iontophoresis 31m/ml Dexamethasone;Gait training;Stair training;Therapeutic activities;Therapeutic exercise;Patient/family education;Neuromuscular re-education;Manual techniques;Taping;Dry needling    PT Next Visit Plan  KX;  walk laps every visit with RW  gait endurance;  LE strengthening progression;  obstacle negotiation;  step ups;  patient to start [employer] in October    PT Home Exercise Plan  [facility]       Patient will benefit from skilled therapeutic intervention in order to improve the following deficits and impairments:  Pain, Decreased range of motion, Decreased strength, Decreased activity tolerance, Impaired perceived functional ability, Difficulty walking, Decreased balance  Visit Diagnosis: Stiffness of right knee, not elsewhere classified  Muscle weakness (generalized)  Chronic pain of right knee     Problem List Patient Active Problem List   Diagnosis Date Noted  . Strain of left piriformis muscle 10/05/2018  . Pain due to onychomycosis of toenails of both feet 09/23/2018  . Chronic left SI joint pain 04/27/2018  . Right wrist pain 02/05/2018  . Degenerative arthritis of right knee 10/23/2017  . Left leg cellulitis 08/12/2017  . Arthritis of right knee 05/20/2017  . Diverticulosis of colon with hemorrhage   . Acute blood loss anemia   . GIB (gastrointestinal bleeding) 03/12/2017  . Leg edema, right 02/20/2015  . BPH (benign prostatic hyperplasia) 12/09/2014  . Chronic radicular low back pain 08/06/2012  . COPD (ch[place] obstructive pulmonary disease) ([place] 04/21/2012  . Syncope 03/29/2012  . Hyponatremia 03/29/2012  . Localized osteoarthrosis not specified whether primary or secondary, lower leg 06/20/2010  . VARICOSE VEINS LOWER EXTREMITIES W/INFLAMMATION 02/12/2010  . SPort RoyalLOCALIZED OSTEOARTHROSIS INVOLVING HAND 10/16/2009  . DUPUYTREN'S CONTRACTURE, RIGHT 07/04/2009  . DEGENERATIVE DISC DISEASE, LUMBOSACRAL  SPINE W/RADICULOPATHY 06/19/2009  . CARCINOMA, BASAL CELL 02/07/2009  . ANEMIA OF CHRONIC DISEASE 02/07/2009  . BURSITIS, RIGHT SHOULDER 10/07/2008  . CATARACT ASSOCIATED WITH OTHER SYNDROMES 08/01/2008  . DRY SKIN 08/01/2008  . CERUMEN IMPACTION, BILATERAL 03/01/2008  . PMR (polym[place]algia rheumatica) ([place] 01/18/2008  . CRAMP IN LIMB 01/18/2008  . DEPRESSION, SITUATIONAL, ACUTE 12/28/2007  . CONSTIPATION, DRUG INDUCED 12/28/2007  . POLYNEUROPATHY OTHER DISEASES CLASSIFIED ELSW 09/24/2007  . WEIGHT LOSS, ABNORMAL 01/07/2007  . Hyperlipidemia 09/22/2006  . Essential hypertension 09/22/2006  . GERD (gastroesophageal reflux disease) 09/22/2006  . Osteoarthritis 09/22/2006   SRuben Im PT 12/10/18 10:15 AM Phone: [phone]: [phone] Singh9/17/2020, 10:14 AM  [facility] [address] [place] [address]Phone: [phone]  Fax:  [phone] Name: PRogen PorteMRN: [id number] of Birth: 704/17/1933

## 2018-12-15 ENCOUNTER — Ambulatory Visit: Payer: Medicare Other | Admitting: Physical Therapy

## 2018-12-15 ENCOUNTER — Encounter: Payer: Self-pay | Admitting: Physical Therapy

## 2018-12-15 ENCOUNTER — Other Ambulatory Visit: Payer: Self-pay

## 2018-12-15 DIAGNOSIS — G8929 Other chronic pain: Secondary | ICD-10-CM | POA: Diagnosis not present

## 2018-12-15 DIAGNOSIS — R296 Repeated falls: Secondary | ICD-10-CM | POA: Diagnosis not present

## 2018-12-15 DIAGNOSIS — M25661 Stiffness of right knee, not elsewhere classified: Secondary | ICD-10-CM

## 2018-12-15 DIAGNOSIS — M6281 Muscle weakness (generalized): Secondary | ICD-10-CM | POA: Diagnosis not present

## 2018-12-15 DIAGNOSIS — M25561 Pain in right knee: Secondary | ICD-10-CM | POA: Diagnosis not present

## 2018-12-15 NOTE — Therapy (Signed)
[facility] [address], [name], [place], [address] Phone: [phone]   Fax:  [phone]  Physical Therapy Treatment  Patient Details  Name: Darin [name] MRN: [id number] Date of Birth: 14-Feb-1932 Referring Provider (PT): Dr. Hulan Saas    Encounter Date: 12/15/2018  PT End of Session - 12/15/18 0739    Visit Number  12    Date for PT Re-Evaluation  01/05/19    Authorization Type  Medicare     PT Start Time  0733    PT Stop Time  0822    PT Time Calculation (min)  49 min    Activity Tolerance  Patient tolerated treatment well       Past Medical History:  Diagnosis Date  . Arthritis   . GERD (gastroesophageal reflux disease)   . H/O hiatal hernia    "FOR YEARS" " NO PROBLEM"  . Hyperlipidemia   . Hypertension   . Knee pain   . Neuropathy    PERIPHERAL   . Osteoporosis   . PMR (polymyalgia rheumatica) (HCC)   . Polymyalgia ([place]) 5 YEARS    Past Surgical History:  Procedure Laterality Date  . CARPAL TUNNEL RELEASE    . CATARACT EXTRACTION     right  . COLONOSCOPY  2007  . INGUINAL HERNIA REPAIR  10/14/2011   Procedure: HERNIA REPAIR INGUINAL ADULT;  Surgeon: [name], MD;  Location: [facility];  Service: General;  Laterality: Right;  Right Inguinal Hernia Repair with Mesh  . TONSILLECTOMY    . VARICOSE VEIN SURGERY      There were no vitals filed for this visit.  Subjective Assessment - 12/15/18 0736    Subjective  I'm doing fine, just a little disorganized.  No issues following last treatment session.    Currently in Pain?  Yes    Pain Score  6     Pain Location  Knee    Pain Orientation  Right    Pain Type  Chronic pain    Pain Score  5    Pain Location  Hip                       [facility] Adult PT Treatment/Exercise - 12/15/18 0001      Therapeutic Activites    ADL's  steps, walking, standing, sit to stand , lifting      Knee/Hip Exercises: Stretches   Hip  Flexor Stretch Limitations  2nd step hip flexor stretch 10x right/left       Knee/Hip Exercises: Aerobic   Nustep  seat 13 arms 11; L3 x15 min    therapist discussing status and response to treatment      Knee/Hip Exercises: Machines for Strengthening   Cybex Leg Press  Seat 9: #110 2x50 reps BLE (pt only completing through partial range); single leg #45 100 reps each (pt completed through greater range with this)       Knee/Hip Exercises: Standing   Other Standing Knee Exercises  toe touch balance with hand slide on thigh 10x right/left       Knee/Hip Exercises: Seated   Other Seated Knee/Hip Exercises  10# dead lift 10x    reports right knee crepitus   Marching Limitations  10# weight touch to opp knee 10x     Sit to Sand  10 reps   holding 10# weight               PT Short  Term Goals - 12/08/18 1546      PT SHORT TERM GOAL #1   Title  be independent in initial HEP      Status  Achieved      PT SHORT TERM GOAL #2   Title  The patient will have improved right knee ROM 3-120 degrees needed for greater ease getting up and down from the chair and in/out of the car    Status  Partially Met      PT SHORT TERM GOAL #3   Title  The patient will have improved hip flexor muscle length to 5 degrees and HS length to 65 degrees needed for improved stride length with ambulation    Status  Achieved      PT SHORT TERM GOAL #4   Title  Timed up and Go to 16 sec indicating improved gait safety    Status  Partially Met      PT SHORT TERM GOAL #5   Title  Ten meter walk test to 14 sec indicate improved gait speed needed for community ambulation     Status  Not Met        PT Long Term Goals - 12/08/18 1546      PT LONG TERM GOAL #1   Title  be independent in advanced HEP and return to gym program to prevent declines in strength, ROM and balance    Time  6    Period  Weeks    Status  On-going    Target Date  01/05/19      PT LONG TERM GOAL #2   Title  The patient will be  able to walk to the mailbox and to the curb to get the newspaper 150 feet each way with knee and hip pain 5/10 or less    Status  Achieved      PT LONG TERM GOAL #3   Title  Improved bil hip abduction, extension and right knee extension strength to 4/5 needed for greater ease with sit to stand and walking/standing    Status  Partially Met      PT LONG TERM GOAL #4   Title  The patient will be able to walk 10 m in < 17 sec without looking at the ground    Time  6    Period  Weeks    Status  On-going      PT LONG TERM GOAL #5   Title  The patient will be able to ambulate 370 feet in six minutes with RW needed for community ambulation    Status  Achieved      PT LONG TERM GOAL #6   Title  Timed up and go test of 19 sec or less indicating improved LE strength and gait speed    Status  Achieved            Plan - 12/15/18 0739    Clinical Impression Statement  The patient is able to perform a progressive loaded strengthening program with reports of muscular fatigue but no exacerbation of knee and hip pain.  He does need rest breaks between aerobic and standing exercises to catch his breath and to allow dizziness to dissipate.  He needs close supervision for safety and to monitor pain and fatigue level.    Personal Factors and Comorbidities  Age;Past/Current Experience;Comorbidity 2;Comorbidity 3+;Time since onset of injury/illness/exacerbation    Rehab Potential  Good    Clinical Impairments Affecting Rehab Potential  risk for falls  PT Frequency  2x / week    PT Duration  4 weeks    PT Treatment/Interventions  ADLs/Self Care Home Management;Cryotherapy;Electrical Stimulation;Ultrasound;Moist Heat;Iontophoresis 68m/ml Dexamethasone;Gait training;Stair training;Therapeutic activities;Therapeutic exercise;Patient/family education;Neuromuscular re-education;Manual techniques;Taping;Dry needling    PT Next Visit Plan  KX;  walk laps every visit with RW  gait endurance;  LE strengthening  progression;  obstacle negotiation;  step ups;  patient to start [employer] in October    PT Home Exercise Plan  [facility]       Patient will benefit from skilled therapeutic intervention in order to improve the following deficits and impairments:  Pain, Decreased range of motion, Decreased strength, Decreased activity tolerance, Impaired perceived functional ability, Difficulty walking, Decreased balance  Visit Diagnosis: Stiffness of right knee, not elsewhere classified  Muscle weakness (generalized)  Chronic pain of right knee     Problem List Patient Active Problem List   Diagnosis Date Noted  . Strain of left piriformis muscle 10/05/2018  . Pain due to onychomycosis of toenails of both feet 09/23/2018  . Chronic left SI joint pain 04/27/2018  . Right wrist pain 02/05/2018  . Degenerative arthritis of right knee 10/23/2017  . Left leg cellulitis 08/12/2017  . Arthritis of right knee 05/20/2017  . Diverticulosis of colon with hemorrhage   . Acute blood loss anemia   . GIB (gastrointestinal bleeding) 03/12/2017  . Leg edema, right 02/20/2015  . BPH (benign prostatic hyperplasia) 12/09/2014  . Chronic radicular low back pain 08/06/2012  . COPD (chronic obstructive pulmonary disease) ([place] 04/21/2012  . Syncope 03/29/2012  . Hyponatremia 03/29/2012  . Localized osteoarthrosis not specified whether primary or secondary, lower leg 06/20/2010  . VARICOSE VEINS LOWER EXTREMITIES W/INFLAMMATION 02/12/2010  . [place]LOCALIZED OSTEOARTHROSIS INVOLVING HAND 10/16/2009  . DUPUYTREN'S CONTRACTURE, RIGHT 07/04/2009  . DEGENERATIVE DISC DISEASE, LUMBOSACRAL SPINE W/RADICULOPATHY 06/19/2009  . CARCINOMA, BASAL CELL 02/07/2009  . ANEMIA OF CHRONIC DISEASE 02/07/2009  . BURSITIS, RIGHT SHOULDER 10/07/2008  . CATARACT ASSOCIATED WITH OTHER SYNDROMES 08/01/2008  . DRY SKIN 08/01/2008  . CERUMEN IMPACTION, BILATERAL 03/01/2008  . PMR (polym[place]algia rheumatica) ([place] 01/18/2008   . CRAMP IN LIMB 01/18/2008  . DEPRESSION, SITUATIONAL, ACUTE 12/28/2007  . CONSTIPATION, DRUG INDUCED 12/28/2007  . POLYNEUROPATHY OTHER DISEASES CLASSIFIED ELSW 09/24/2007  . WEIGHT LOSS, ABNORMAL 01/07/2007  . Hyperlipidemia 09/22/2006  . Essential hypertension 09/22/2006  . GERD (gastroesophageal reflux disease) 09/22/2006  . Osteoarthritis 09/22/2006   SRuben Im PT 12/15/18 8:55 AM Phone: [phone]: [phone] Singh9/22/2020, 8:55 AM  [facility] [address] [place] [address]Phone: [phone]  Fax:  [phone] Name: PAsif MuchowMRN: [id number] of Birth: 7Jun 18, 1933

## 2018-12-17 ENCOUNTER — Other Ambulatory Visit: Payer: Self-pay

## 2018-12-17 ENCOUNTER — Ambulatory Visit: Payer: Medicare Other | Admitting: Physical Therapy

## 2018-12-17 DIAGNOSIS — G8929 Other chronic pain: Secondary | ICD-10-CM

## 2018-12-17 DIAGNOSIS — R296 Repeated falls: Secondary | ICD-10-CM

## 2018-12-17 DIAGNOSIS — M6281 Muscle weakness (generalized): Secondary | ICD-10-CM | POA: Diagnosis not present

## 2018-12-17 DIAGNOSIS — M25661 Stiffness of right knee, not elsewhere classified: Secondary | ICD-10-CM

## 2018-12-17 DIAGNOSIS — M25561 Pain in right knee: Secondary | ICD-10-CM | POA: Diagnosis not present

## 2018-12-17 NOTE — Therapy (Signed)
[facility] [address] [place], [place], [address] Phone: [phone]   Fax:  [phone]  Physical Therapy Treatment  Patient Details  Name: Darin [name] MRN: [id number] Date of Birth: Jul 10, 1931 Referring Provider (PT): Dr. Hulan Saas    Encounter Date: 12/17/2018  PT End of Session - 12/17/18 0744    Visit Number  15    Date for PT Re-Evaluation  01/05/19    Authorization Type  Medicare     PT Start Time  0932   pt late   PT Stop Time  0819    PT Time Calculation (min)  42 min    Activity Tolerance  Patient tolerated treatment well       Past Medical History:  Diagnosis Date  . Arthritis   . GERD (gastroesophageal reflux disease)   . H/O hiatal hernia    "FOR YEARS" " NO PROBLEM"  . Hyperlipidemia   . Hypertension   . Knee pain   . Neuropathy    PERIPHERAL   . Osteoporosis   . PMR (polymyalgia rheumatica) (HCC)   . Polymyalgia ([place]) 5 YEARS    Past Surgical History:  Procedure Laterality Date  . CARPAL TUNNEL RELEASE    . CATARACT EXTRACTION     right  . COLONOSCOPY  2007  . INGUINAL HERNIA REPAIR  10/14/2011   Procedure: HERNIA REPAIR INGUINAL ADULT;  Surgeon: [name], MD;  Location: [place];  Service: General;  Laterality: Right;  Right Inguinal Hernia Repair with Mesh  . TONSILLECTOMY    . VARICOSE VEIN SURGERY      There were no vitals filed for this visit.  Subjective Assessment - 12/17/18 0739    Subjective  No changes.  My knee only hurts when it gives out.  It's random, sometimes not at all.  Sometimes 6-7x/day.    Pertinent History  ESI on 7/31    Currently in Pain?  Yes    Pain Score  6     Pain Location  Knee    Pain Type  Chronic pain    Multiple Pain Sites  Yes    Pain Score  5    Pain Location  Hip                       [facility] Adult PT Treatment/Exercise - 12/17/18 0001      Therapeutic Activites    ADL's  steps, walking, standing, sit to  stand , lifting      Knee/Hip Exercises: Stretches   Hip Flexor Stretch Limitations  2nd step hip flexor stretch 20x right/left     Other Knee/Hip Stretches  slant board stretch 10x right/left       Knee/Hip Exercises: Aerobic   Nustep  seat 13 arms 11; L3 x15 min    therapist discussing status and response to treatment      Knee/Hip Exercises: Machines for Strengthening   Cybex Leg Press  Seat 9: #110 2x50 reps BLE (pt only completing through partial range); single leg #45 100 reps each (pt completed through greater range with this)       Knee/Hip Exercises: Standing   Forward Step Up  Right;Left;10 reps;Hand Hold: 2;Step Height: 6"    Walking with Sports Cord  green band shoulder extensions in staggered standing 2x10     Gait Training  box step 8x each direction     Other Standing Knee Exercises  washcloth floor slides 10x abduction  right/left     Other Standing Knee Exercises  washcloth floor circles 10x right/left       Knee/Hip Exercises: Seated   Other Seated Knee/Hip Exercises  chair sit ups holding a 10#  weight 20x    Marching Limitations  10# weight touch to opp knee 10x                PT Short Term Goals - 12/08/18 1546      PT SHORT TERM GOAL #1   Title  be independent in initial HEP      Status  Achieved      PT SHORT TERM GOAL #2   Title  The patient will have improved right knee ROM 3-120 degrees needed for greater ease getting up and down from the chair and in/out of the car    Status  Partially Met      PT SHORT TERM GOAL #3   Title  The patient will have improved hip flexor muscle length to 5 degrees and HS length to 65 degrees needed for improved stride length with ambulation    Status  Achieved      PT SHORT TERM GOAL #4   Title  Timed up and Go to 16 sec indicating improved gait safety    Status  Partially Met      PT SHORT TERM GOAL #5   Title  Ten meter walk test to 14 sec indicate improved gait speed needed for community ambulation      Status  Not Met        PT Long Term Goals - 12/08/18 1546      PT LONG TERM GOAL #1   Title  be independent in advanced HEP and return to gym program to prevent declines in strength, ROM and balance    Time  6    Period  Weeks    Status  On-going    Target Date  01/05/19      PT LONG TERM GOAL #2   Title  The patient will be able to walk to the mailbox and to the curb to get the newspaper 150 feet each way with knee and hip pain 5/10 or less    Status  Achieved      PT LONG TERM GOAL #3   Title  Improved bil hip abduction, extension and right knee extension strength to 4/5 needed for greater ease with sit to stand and walking/standing    Status  Partially Met      PT LONG TERM GOAL #4   Title  The patient will be able to walk 10 m in < 17 sec without looking at the ground    Time  6    Period  Weeks    Status  On-going      PT LONG TERM GOAL #5   Title  The patient will be able to ambulate 370 feet in six minutes with RW needed for community ambulation    Status  Achieved      PT LONG TERM GOAL #6   Title  Timed up and go test of 19 sec or less indicating improved LE strength and gait speed    Status  Achieved            Plan - 12/17/18 0759    Clinical Impression Statement  The patient is standing more erect but needs frequent reminders to hold head erect.  He is able to take a 2  steps without  his RW and static stand for 30 sec without UE assist.   He needs rest breaks after 3-4 minutes of standing ex's secondary to general fatigue and mild dizziness.  Therapist closely monitoring response with all treatment interventions.  Knee and hip pain no worse following treatment session.  Therapist also closely supervising for safety secondary to fall risk.    Personal Factors and Comorbidities  Age;Past/Current Experience;Comorbidity 2;Comorbidity 3+;Time since onset of injury/illness/exacerbation    Rehab Potential  Good    Clinical Impairments Affecting Rehab Potential   risk for falls    PT Frequency  2x / week    PT Duration  4 weeks    PT Treatment/Interventions  ADLs/Self Care Home Management;Cryotherapy;Electrical Stimulation;Ultrasound;Moist Heat;Iontophoresis 47m/ml Dexamethasone;Gait training;Stair training;Therapeutic activities;Therapeutic exercise;Patient/family education;Neuromuscular re-education;Manual techniques;Taping;Dry needling    PT Next Visit Plan  KX; balance;  10 m walk test;    LE strengthening progression;  obstacle negotiation;  step ups;  patient to start [employer] in October    PT Home Exercise Plan  [facility]       Patient will benefit from skilled therapeutic intervention in order to improve the following deficits and impairments:  Pain, Decreased range of motion, Decreased strength, Decreased activity tolerance, Impaired perceived functional ability, Difficulty walking, Decreased balance  Visit Diagnosis: Stiffness of right knee, not elsewhere classified  Muscle weakness (generalized)  Chronic pain of right knee  Repeated falls     Problem List Patient Active Problem List   Diagnosis Date Noted  . Strain of left piriformis muscle 10/05/2018  . Pain due to onychomycosis of toenails of both feet 09/23/2018  . Chronic left SI joint pain 04/27/2018  . Right wrist pain 02/05/2018  . Degenerative arthritis of right knee 10/23/2017  . Left leg cellulitis 08/12/2017  . Arthritis of right knee 05/20/2017  . Diverticulosis of colon with hemorrhage   . Acute blood loss anemia   . GIB (gastrointestinal bleeding) 03/12/2017  . Leg edema, right 02/20/2015  . BPH (benign prostatic hyperplasia) 12/09/2014  . Chronic radicular low back pain 08/06/2012  . COPD (chronic obstructive pulmonary disease) ([place] 04/21/2012  . Syncope 03/29/2012  . Hyponatremia 03/29/2012  . Localized osteoarthrosis not specified whether primary or secondary, lower leg 06/20/2010  . VARICOSE VEINS LOWER EXTREMITIES W/INFLAMMATION  02/12/2010  . [place] OSTE[place]ROS[place] INVOLVING HAND 10/16/2009  . DUPUYTREN'S CONTRACTURE, RIGHT 07/04/2009  . DEGENERATIVE DISC DISEASE, LUMBOSACRAL SPINE W/RADICULOPATHY 06/19/2009  . CARCINOMA, BASAL CELL 02/07/2009  . ANEMIA OF CHRONIC DISEASE 02/07/2009  . BURSITIS, RIGHT SHOULDER 10/07/2008  . CATARACT ASSOCIATED WITH OTHER SYNDROMES 08/01/2008  . DRY SKIN 08/01/2008  . CERUMEN IMPACTION, BILATERAL 03/01/2008  . PMR (polym[place]algia [place]umatica) ([place] 01/18/2008  . CRAMP IN LIMB 01/18/2008  . DEPRESSION, SITUATIONAL, ACUTE 12/28/2007  . CONSTIPATION, DRUG INDUCED 12/28/2007  . POLYNEUROPATHY OTHER DISEASES CLASSIFIED ELSW 09/24/2007  . WEIGHT LOSS, ABNORMAL 01/07/2007  . Hyperlipidemia 09/22/2006  . Essential hypertension 09/22/2006  . GERD (gastroesophageal reflux disease) 09/22/2006  . Osteoarthritis 09/22/2006   SRuben Im PT 12/17/18 8:08 AM Phone: [phone]: [phone] Singh9/24/2020, 8:05 AM  [facility] [address] [place] [address]Phone: [phone]  Fax:  [phone] Name: PQuintez MaselliMRN: [id number] of Birth: 702/08/1931

## 2018-12-22 ENCOUNTER — Other Ambulatory Visit: Payer: Self-pay

## 2018-12-22 ENCOUNTER — Encounter: Payer: Self-pay | Admitting: Physical Therapy

## 2018-12-22 ENCOUNTER — Ambulatory Visit: Payer: Medicare Other | Admitting: Physical Therapy

## 2018-12-22 DIAGNOSIS — R296 Repeated falls: Secondary | ICD-10-CM | POA: Diagnosis not present

## 2018-12-22 DIAGNOSIS — G8929 Other chronic pain: Secondary | ICD-10-CM

## 2018-12-22 DIAGNOSIS — M25561 Pain in right knee: Secondary | ICD-10-CM

## 2018-12-22 DIAGNOSIS — M25661 Stiffness of right knee, not elsewhere classified: Secondary | ICD-10-CM

## 2018-12-22 DIAGNOSIS — M6281 Muscle weakness (generalized): Secondary | ICD-10-CM

## 2018-12-22 NOTE — Therapy (Signed)
[facility] [address] [place], [place], [address] Phone: [phone]   Fax:  [phone]  Physical Therapy Treatment  Patient Details  Name: Darin [name] MRN: [id number] Date of Birth: 1932/01/07 Referring Provider (PT): Dr. Hulan Saas    Encounter Date: 12/22/2018  PT End of Session - 12/22/18 0747    Visit Number  9    Date for PT Re-Evaluation  01/05/19    Authorization Type  Medicare     PT Start Time  0730   pt in bathroom for an extended length of time during session   PT Stop Time  0820    PT Time Calculation (min)  50 min    Activity Tolerance  Patient tolerated treatment well       Past Medical History:  Diagnosis Date  . Arthritis   . GERD (gastroesophageal reflux disease)   . H/O hiatal hernia    "FOR YEARS" " NO PROBLEM"  . Hyperlipidemia   . Hypertension   . Knee pain   . Neuropathy    PERIPHERAL   . Osteoporosis   . PMR (polymyalgia rheumatica) (HCC)   . Polymyalgia ([place]) 5 YEARS    Past Surgical History:  Procedure Laterality Date  . CARPAL TUNNEL RELEASE    . CATARACT EXTRACTION     right  . COLONOSCOPY  2007  . INGUINAL HERNIA REPAIR  10/14/2011   Procedure: HERNIA REPAIR INGUINAL ADULT;  Surgeon: [name], MD;  Location: [facility];  Service: General;  Laterality: Right;  Right Inguinal Hernia Repair with Mesh  . TONSILLECTOMY    . VARICOSE VEIN SURGERY      There were no vitals filed for this visit.  Subjective Assessment - 12/22/18 0732    Subjective  I have mixed emotions about leaving here and starting [employer].  It feels like home.  Knee give way is a little less often.    Currently in Pain?  Yes    Pain Score  6     Pain Location  Knee    Pain Orientation  Right    Pain Type  Chronic pain    Pain Score  5    Pain Location  Hip    Pain Orientation  Left    Pain Type  Chronic pain         [facility] PT Assessment - 12/22/18 0001      Standardized  Balance Assessment   10 Meter Walk  18.1 with RW      Timed Up and Go Test   Normal TUG (seconds)  20.1   with foam                  [facility] Adult PT Treatment/Exercise - 12/22/18 0001      Therapeutic Activites    ADL's  steps, walking, standing, sit to stand , lifting      Knee/Hip Exercises: Stretches   Hip Flexor Stretch Limitations  2nd step hip flexor stretch 20x right/left       Knee/Hip Exercises: Aerobic   Nustep  seat 13 arms 11; L3 x15 min    therapist discussing status and response to treatment      Knee/Hip Exercises: Machines for Strengthening   Cybex Leg Press  Seat 9: #115 2x50 reps BLE (pt only completing through partial range); single leg #50 100 reps each (pt completed through greater range with this)       Knee/Hip Exercises: Seated  Other Seated Knee/Hip Exercises  standing dead lift 10# 15x    Marching Limitations  10# weight touch to opp knee 10x     Sit to Sand  15 reps   holding 10# weight               PT Short Term Goals - 12/08/18 1546      PT SHORT TERM GOAL #1   Title  be independent in initial HEP      Status  Achieved      PT SHORT TERM GOAL #2   Title  The patient will have improved right knee ROM 3-120 degrees needed for greater ease getting up and down from the chair and in/out of the car    Status  Partially Met      PT SHORT TERM GOAL #3   Title  The patient will have improved hip flexor muscle length to 5 degrees and HS length to 65 degrees needed for improved stride length with ambulation    Status  Achieved      PT SHORT TERM GOAL #4   Title  Timed up and Go to 16 sec indicating improved gait safety    Status  Partially Met      PT SHORT TERM GOAL #5   Title  Ten meter walk test to 14 sec indicate improved gait speed needed for community ambulation     Status  Not Met        PT Long Term Goals - 12/22/18 1034      PT LONG TERM GOAL #1   Title  be independent in advanced HEP and return to gym program  to prevent declines in strength, ROM and balance    Time  6    Period  Weeks    Status  On-going      PT LONG TERM GOAL #2   Title  The patient will be able to walk to the mailbox and to the curb to get the newspaper 150 feet each way with knee and hip pain 5/10 or less    Status  Achieved      PT LONG TERM GOAL #3   Title  Improved bil hip abduction, extension and right knee extension strength to 4/5 needed for greater ease with sit to stand and walking/standing    Time  6    Period  Weeks    Status  Partially Met      PT LONG TERM GOAL #4   Title  The patient will be able to walk 10 m in < 17 sec without looking at the ground    Status  Not Met      PT LONG TERM GOAL #5   Title  The patient will be able to ambulate 370 feet in six minutes with RW needed for community ambulation    Status  Achieved      PT LONG TERM GOAL #6   Title  Timed up and go test of 19 sec or less indicating improved LE strength and gait speed    Status  Partially Met            Plan - 12/22/18 0752    Clinical Impression Statement  Although the patient has made minimal improvements in 10 m walk and Timed up and go speeds, he has been able to increase his community ambulation distance to resume volunteer work at the hospital.  He is also able to walk short distances to his mailbox,  go pick up take out food, and do his volunteer work.  Previously he had gotten to the point of driving his car to his mailbox.  He has been able to progress strength to do step ups with UE assist; loaded strengthening through a partial range of motion and dead lifting.   His knee and hip pain has remained at a steady intensity for the past several weeks.  He does report today fewer overall incidences in knee buckling/giveway.  Gyms in [place] have just recently reopened at low capacity due to the covid pandemic.  He has met with a staff member at [employer] and will start with personal training there next week.  Anticipate discharge  from PT next visit.    Personal Factors and Comorbidities  Age;Past/Current Experience;Comorbidity 2;Comorbidity 3+;Time since onset of injury/illness/exacerbation    Rehab Potential  Good    Clinical Impairments Affecting Rehab Potential  risk for falls    PT Frequency  2x / week    PT Duration  4 weeks    PT Treatment/Interventions  ADLs/Self Care Home Management;Cryotherapy;Electrical Stimulation;Ultrasound;Moist Heat;Iontophoresis 29m/ml Dexamethasone;Gait training;Stair training;Therapeutic activities;Therapeutic exercise;Patient/family education;Neuromuscular re-education;Manual techniques;Taping;Dry needling    PT Next Visit Plan  KX; do 6 min walk test;   MMT bil LEs;  60th visit progress note;   discharge from PT next visit;   patient to start [employer] in October    PT Home Exercise Plan  [facility]       Patient will benefit from skilled therapeutic intervention in order to improve the following deficits and impairments:  Pain, Decreased range of motion, Decreased strength, Decreased activity tolerance, Impaired perceived functional ability, Difficulty walking, Decreased balance  Visit Diagnosis: Stiffness of right knee, not elsewhere classified  Muscle weakness (generalized)  Chronic pain of right knee     Problem List Patient Active Problem List   Diagnosis Date Noted  . Strain of left piriformis muscle 10/05/2018  . Pain due to onychomycosis of toenails of both feet 09/23/2018  . Chronic left SI joint pain 04/27/2018  . Right wrist pain 02/05/2018  . Degenerative arthritis of right knee 10/23/2017  . Left leg cellulitis 08/12/2017  . Arthritis of right knee 05/20/2017  . Diverticulosis of colon with hemorrhage   . Acute blood loss anemia   . GIB (gastrointestinal bleeding) 03/12/2017  . Leg edema, right 02/20/2015  . BPH (benign prostatic hyperplasia) 12/09/2014  . Chronic radicular low back pain 08/06/2012  . COPD (chronic obstructive pulmonary  disease) ([place] 04/21/2012  . Syncope 03/29/2012  . Hyponatremia 03/29/2012  . Localized osteoarthrosis not specified whether primary or secondary, lower leg 06/20/2010  . VARICOSE VEINS LOWER EXTREMITIES W/INFLAMMATION 02/12/2010  . [place] OSTEOARTHROSIS INVOLVING HAND 10/16/2009  . DUPUYTREN'S CONTRACTURE, RIGHT 07/04/2009  . DEGENERATIVE DISC DISEASE, LUMBOSACRAL SPINE W/RADICULOPATHY 06/19/2009  . CARCINOMA, BASAL CELL 02/07/2009  . ANEMIA OF CHRONIC DISEASE 02/07/2009  . BURSITIS, RIGHT SHOULDER 10/07/2008  . CATARACT ASSOCIATED WITH OTHER SYNDROMES 08/01/2008  . DRY SKIN 08/01/2008  . CERUMEN IMPACTION, BILATERAL 03/01/2008  . PMR (polymyalgia rheumatica) ([place] 01/18/2008  . CRAMP IN LIMB 01/18/2008  . DEPRESSION, SITUATIONAL, ACUTE 12/28/2007  . CONSTIPATION, DRUG INDUCED 12/28/2007  . POLYNEUROPATHY OTHER DISEASES CLASSIFIED ELSW 09/24/2007  . WEIGHT LOSS, ABNORMAL 01/07/2007  . Hyperlipidemia 09/22/2006  . Essential hypertension 09/22/2006  . GERD (gastroesophageal reflux disease) 09/22/2006  . Osteoarthritis 09/22/2006   SRuben Im PT 12/22/18 10:37 AM Phone: [phone]: [phone] Singh9/29/2020, 10:36  AM  [facility] [address], [place], [place], [address] Phone: [phone]   Fax:  [phone]  Name: Cephas Revard MRN: [id number] Date of Birth: Jan 24, 1932

## 2018-12-24 ENCOUNTER — Ambulatory Visit: Payer: Medicare Other | Attending: Family Medicine

## 2018-12-24 ENCOUNTER — Other Ambulatory Visit: Payer: Self-pay

## 2018-12-24 DIAGNOSIS — M6281 Muscle weakness (generalized): Secondary | ICD-10-CM

## 2018-12-24 DIAGNOSIS — M25661 Stiffness of right knee, not elsewhere classified: Secondary | ICD-10-CM

## 2018-12-24 DIAGNOSIS — R296 Repeated falls: Secondary | ICD-10-CM | POA: Diagnosis not present

## 2018-12-24 DIAGNOSIS — R262 Difficulty in walking, not elsewhere classified: Secondary | ICD-10-CM

## 2018-12-24 DIAGNOSIS — M25561 Pain in right knee: Secondary | ICD-10-CM | POA: Diagnosis not present

## 2018-12-24 DIAGNOSIS — G8929 Other chronic pain: Secondary | ICD-10-CM

## 2018-12-24 NOTE — Therapy (Signed)
[facility] [address], [place], [place], [address] Phone: [phone]   Fax:  [phone]  Physical Therapy Treatment  Patient Details  Name: Darin [name] MRN: [id number] Date of Birth: Apr 04, 1931 Referring Provider (PT): Dr. Hulan Saas    Encounter Date: 12/24/2018  PT End of Session - 12/24/18 0853    Visit Number  40    PT Start Time  0812   some of NuStep time was unattended   PT Stop Time  0905    PT Time Calculation (min)  53 min    Activity Tolerance  Patient tolerated treatment well    Behavior During Therapy  [facility] for tasks assessed/performed       Past Medical History:  Diagnosis Date  . Arthritis   . GERD (gastroesophageal reflux disease)   . H/O hiatal hernia    "FOR YEARS" " NO PROBLEM"  . Hyperlipidemia   . Hypertension   . Knee pain   . Neuropathy    PERIPHERAL   . Osteoporosis   . PMR (polymyalgia rheumatica) (HCC)   . Polymyalgia ([place]) 5 YEARS    Past Surgical History:  Procedure Laterality Date  . CARPAL TUNNEL RELEASE    . CATARACT EXTRACTION     right  . COLONOSCOPY  2007  . INGUINAL HERNIA REPAIR  10/14/2011   Procedure: HERNIA REPAIR INGUINAL ADULT;  Surgeon: [name], MD;  Location: [facility];  Service: General;  Laterality: Right;  Right Inguinal Hernia Repair with Mesh  . TONSILLECTOMY    . VARICOSE VEIN SURGERY      There were no vitals filed for this visit.      [facility] PT Assessment - 12/24/18 0001      Assessment   Medical Diagnosis  right knee pain; weakness    Referring Provider (PT)  Dr. Hulan Saas       Home Environment   Living Environment  Private residence      Prior Function   Level of Independence  Independent      Strength   Left Hip Flexion  4+/5    Left Hip Extension  4+/5    Left Hip ABduction  4/5    Right Knee Flexion  4/5    Right Knee Extension  4/5    Left Knee Flexion  4+/5    Left Knee Extension  4+/5       Ambulation/Gait   Ambulation/Gait  Yes    Ambulation/Gait Assistance  6: Modified independent (Device/Increase time)      6 Minute Walk- Baseline   6 Minute Walk- Baseline  yes    Perceived Rate of Exertion ([place])  9- very light      6 minute walk test results    Aerobic Endurance Distance Walked  453      Standardized Balance Assessment   10 Meter Walk  18.1 with RW      Timed Up and Go Test   Normal TUG (seconds)  20.1   with foam                  [facility] Adult PT Treatment/Exercise - 12/24/18 0001      Therapeutic Activites    ADL's  steps, walking, standing, sit to stand , lifting      Knee/Hip Exercises: Stretches   Hip Flexor Stretch Limitations  2nd step hip flexor stretch 20x right/left       Knee/Hip Exercises: Aerobic   Nustep  seat  13 arms 11; L3 x15 min    therapist discussing status and response to treatment      Knee/Hip Exercises: Machines for Strengthening   Cybex Leg Press  Seat 9: #115 2x50 reps BLE (pt only completing through partial range); single leg #50 100 reps each (pt completed through greater range with this)       Knee/Hip Exercises: Seated   Other Seated Knee/Hip Exercises  standing dead lift 10# 15x    Marching Limitations  10# weight touch to opp knee 10x     Sit to Sand  15 reps   holding 10# weight               PT Short Term Goals - 12/08/18 1546      PT SHORT TERM GOAL #1   Title  be independent in initial HEP      Status  Achieved      PT SHORT TERM GOAL #2   Title  The patient will have improved right knee ROM 3-120 degrees needed for greater ease getting up and down from the chair and in/out of the car    Status  Partially Met      PT SHORT TERM GOAL #3   Title  The patient will have improved hip flexor muscle length to 5 degrees and HS length to 65 degrees needed for improved stride length with ambulation    Status  Achieved      PT SHORT TERM GOAL #4   Title  Timed up and Go to 16 sec indicating improved  gait safety    Status  Partially Met      PT SHORT TERM GOAL #5   Title  Ten meter walk test to 14 sec indicate improved gait speed needed for community ambulation     Status  Not Met        PT Long Term Goals - 12/24/18 7341      PT LONG TERM GOAL #1   Title  be independent in advanced HEP and return to gym program to prevent declines in strength, ROM and balance    Status  Achieved      PT LONG TERM GOAL #2   Title  The patient will be able to walk to the mailbox and to the curb to get the newspaper 150 feet each way with knee and hip pain 5/10 or less    Status  Achieved      PT LONG TERM GOAL #3   Title  Improved bil hip abduction, extension and right knee extension strength to 4/5 needed for greater ease with sit to stand and walking/standing    Baseline  extension 4/5, flexion 4-/5    Status  Achieved      PT LONG TERM GOAL #4   Title  The patient will be able to walk 10 m in < 17 sec without looking at the ground    Baseline  18.1 seconds    Status  Partially Met      PT LONG TERM GOAL #5   Title  The patient will be able to ambulate 370 feet in six minutes with RW needed for community ambulation    Baseline  453    Status  Achieved      PT LONG TERM GOAL #6   Title  Timed up and go test of 19 sec or less indicating improved LE strength and gait speed    Baseline  20.1    Status  Achieved            Plan - 12/24/18 4742    Clinical Impression Statement  Although the patient has made minimal improvements in 10 m walk and Timed up and go speeds, he has been able to increase his community ambulation distance to resume volunteer work at the hospital.  He is also able to walk short distances to his mailbox, go pick up take out food, and do his volunteer work.  Pt walked 453 feet in 6 minutes today.  Previously he had gotten to the point of driving his car to his mailbox.  He has been able to progress strength to do step ups with UE assist; loaded strengthening  through a partial range of motion and dead lifting.   His knee and hip pain has remained at a steady intensity for the past several weeks.  He does report today fewer overall incidences in knee buckling/giveway.  Gyms in [place] have just recently reopened at low capacity due to the covid pandemic.  He has met with a staff member at [employer] and will start with personal training there next week.    PT Next Visit Plan  D/C PT to HEP.  Pt plans to got to [employer]    PT Home Exercise Plan  Access Code: [email or web address]     Consulted and Agree with Plan of Care  Patient       Patient will benefit from skilled therapeutic intervention in order to improve the following deficits and impairments:     Visit Diagnosis: Stiffness of right knee, not elsewhere classified  Muscle weakness (generalized)  Chronic pain of right knee  Repeated falls  Difficulty in walking, not elsewhere classified     Problem List Patient Active Problem List   Diagnosis Date Noted  . Strain of left piriformis muscle 10/05/2018  . Pain due to onychomycosis of toenails of both feet 09/23/2018  . Chronic left SI joint pain 04/27/2018  . Right wrist pain 02/05/2018  . Degenerative arthritis of right knee 10/23/2017  . Left leg cellulitis 08/12/2017  . Arthritis of right knee 05/20/2017  . Diverticulosis of colon with hemorrhage   . Acute blood loss anemia   . GIB (gastrointestinal bleeding) 03/12/2017  . Leg edema, right 02/20/2015  . BPH (benign prostatic hyperplasia) 12/09/2014  . Chronic radicular low back pain 08/06/2012  . COPD (chronic obstructive pulmonary disease) ([place]) 04/21/2012  . Syncope 03/29/2012  . Hyponatremia 03/29/2012  . Localized osteoarthrosis not specified whether primary or secondary, lower leg 06/20/2010  . VARICOSE VEINS LOWER EXTREMITIES W/INFLAMMATION 02/12/2010  . [place] LOCALIZED OSTEOARTHROSIS INVOLVING HAND 10/16/2009  . DUPUYTREN'S CONTRACTURE, RIGHT 07/04/2009  . DEGENERATIVE DISC  DISEASE, LUMBOSACRAL SPINE W/RADICULOPATHY 06/19/2009  . CARCINOMA, BASAL CELL 02/07/2009  . ANEMIA OF CHRONIC DISEASE 02/07/2009  . BURSITIS, RIGHT SHOULDER 10/07/2008  . CATARACT ASSOCIATED WITH OTHER SYNDROMES 08/01/2008  . DRY SKIN 08/01/2008  . CERUMEN IMPACTION, BILATERAL 03/01/2008  . PMR (polymyalgia rheumatica) ([place]) 01/18/2008  . CRAMP IN LIMB 01/18/2008  . DEPRESSION, SITUATIONAL, ACUTE 12/28/2007  . CONSTIPATION, DRUG INDUCED 12/28/2007  . POLYNEUROPATHY OTHER DISEASES CLASSIFIED ELSW 09/24/2007  . WEIGHT LOSS, ABNORMAL 01/07/2007  . Hyperlipidemia 09/22/2006  . Essential hypertension 09/22/2006  . GERD (gastroesophageal reflux disease) 09/22/2006  . Osteoarthritis 09/22/2006   PHYSICAL THERAPY DISCHARGE SUMMARY  Visits from Start of Care: 60  Current functional level related to goals / functional outcomes: See above for PT status.  Pt has reached a plateau  in PT care and will transition to gym program.     Remaining deficits: Chronic weakness and gait dysfunction with reduced overall safety.     Education / Equipment: HEP, fall prevention Plan: Patient agrees to discharge.  Patient goals were partially met. Patient is being discharged due to being pleased with the current functional level.  ?????         Sigurd Sos, PT 12/24/18 9:01 AM  [facility] [address], [place], [place], [address] Phone: [phone]   Fax:  [phone]  Name: Humza Tallerico MRN: [id number] Date of Birth: 08-13-1931

## 2018-12-25 ENCOUNTER — Ambulatory Visit (INDEPENDENT_AMBULATORY_CARE_PROVIDER_SITE_OTHER): Payer: Medicare Other | Admitting: Podiatry

## 2018-12-25 ENCOUNTER — Encounter: Payer: Self-pay | Admitting: Podiatry

## 2018-12-25 ENCOUNTER — Other Ambulatory Visit: Payer: Self-pay

## 2018-12-25 DIAGNOSIS — M79674 Pain in right toe(s): Secondary | ICD-10-CM

## 2018-12-25 DIAGNOSIS — M79675 Pain in left toe(s): Secondary | ICD-10-CM

## 2018-12-25 DIAGNOSIS — G609 Hereditary and idiopathic neuropathy, unspecified: Secondary | ICD-10-CM

## 2018-12-25 DIAGNOSIS — B351 Tinea unguium: Secondary | ICD-10-CM

## 2018-12-25 NOTE — Progress Notes (Signed)
Complaint:  Visit Type: Patient returns to my office for continued preventative foot care services. Complaint: Patient states" my nails have grown long and thick and become painful to walk and wear shoes" Patient has been diagnosed with neuropathy.. The patient presents for preventative foot care services. No changes to ROS.  Patient says his feet have improved wearing his powerstep insoles.  Podiatric Exam: Vascular: dorsalis pedis and posterior tibial pulses are palpable bilateral. Capillary return is immediate. Temperature gradient is WNL. Skin turgor WNL  Sensorium: Normal Semmes Weinstein monofilament test. Normal tactile sensation bilaterally. Nail Exam: Pt has thick disfigured discolored nails with subungual debris noted bilateral entire nail hallux through fifth toenails Ulcer Exam: There is no evidence of ulcer or pre-ulcerative changes or infection. Orthopedic Exam: Muscle tone and strength are WNL. No limitations in general ROM. No crepitus or effusions noted. Foot type and digits show no abnormalities. Bony prominences are unremarkable. Skin: No Porokeratosis. No infection or ulcers  Diagnosis:  Onychomycosis, , Pain in right toe, pain in left toes.    Treatment & Plan Procedures and Treatment: Consent by patient was obtained for treatment procedures. The patient understood the discussion of treatment and procedures well. All questions were answered thoroughly reviewed. Debridement of mycotic and hypertrophic toenails, 1 through 5 bilateral and clearing of subungual debris. No ulceration, no infection noted.  Return Visit-Office Procedure: Patient instructed to return to the office for a follow up visit 3 months for continued evaluation and treatment.    Arvel Oquinn DPM 

## 2019-02-05 ENCOUNTER — Other Ambulatory Visit: Payer: Self-pay

## 2019-02-05 ENCOUNTER — Encounter: Payer: Self-pay | Admitting: Family Medicine

## 2019-02-05 ENCOUNTER — Other Ambulatory Visit: Payer: Self-pay | Admitting: Family Medicine

## 2019-02-05 ENCOUNTER — Ambulatory Visit (INDEPENDENT_AMBULATORY_CARE_PROVIDER_SITE_OTHER): Payer: Medicare Other | Admitting: Family Medicine

## 2019-02-05 VITALS — BP 124/62 | HR 78 | Temp 98.2°F | Ht 73.0 in | Wt 199.4 lb

## 2019-02-05 DIAGNOSIS — I10 Essential (primary) hypertension: Secondary | ICD-10-CM

## 2019-02-05 DIAGNOSIS — H6123 Impacted cerumen, bilateral: Secondary | ICD-10-CM | POA: Diagnosis not present

## 2019-02-05 MED ORDER — AMLODIPINE BESYLATE 5 MG PO TABS: 5.0000 mg | ORAL_TABLET | Freq: Every day | ORAL | 3 refills | Status: DC

## 2019-02-05 NOTE — Progress Notes (Signed)
  Subjective:     Patient ID: Darin [name], male   DOB: 1932-03-03, 85 y.o.   MRN: [id number]  HPI Mr. Fithen is seen with bilateral ear fullness.  He has had cerumen impaction in the past.  No dizziness.  No tinnitus.  No hearing loss.  He has hypertension treated with amlodipine and benazepril HCTZ.  Needs refills of amlodipine.  Blood pressure stable.  No recent orthostatic symptoms.  His major issues have been difficulties with ambulation and currently uses a walker.  Denies recent fall.  Past Medical History:  Diagnosis Date  . Arthritis   . GERD (gastroesophageal reflux disease)   . H/O hiatal hernia    "FOR YEARS" " NO PROBLEM"  . Hyperlipidemia   . Hypertension   . Knee pain   . Neuropathy    PERIPHERAL   . Osteoporosis   . PMR (polymyalgia rheumatica) (HCC)   . Polymyalgia ([place]) 5 YEARS   Past Surgical History:  Procedure Laterality Date  . CARPAL TUNNEL RELEASE    . CATARACT EXTRACTION     right  . COLONOSCOPY  2007  . INGUINAL HERNIA REPAIR  10/14/2011   Procedure: HERNIA REPAIR INGUINAL ADULT;  Surgeon: [name], MD;  Location: [facility];  Service: General;  Laterality: Right;  Right Inguinal Hernia Repair with Mesh  . TONSILLECTOMY    . VARICOSE VEIN SURGERY      reports that he has never smoked. He has never used smokeless tobacco. He reports current alcohol use of about 3.0 standard drinks of alcohol per week. He reports that he does not use drugs. family history includes Coronary artery disease in an other family member; Heart disease in his father. Allergies  Allergen Reactions  . Statins Other (See Comments)    Muscle pain and upset stomach     Review of Systems  Constitutional: Negative for fatigue.  HENT: Negative for ear discharge, ear pain and hearing loss.   Eyes: Negative for visual disturbance.  Respiratory: Negative for cough, chest tightness and shortness of breath.   Cardiovascular: Negative for chest pain, palpitations and leg  swelling.  Endocrine: Negative for polydipsia and polyuria.  Neurological: Negative for dizziness, syncope, weakness, light-headedness and headaches.       Objective:   Physical Exam Constitutional:      Appearance: Normal appearance.  HENT:     Ears:     Comments: Cerumen impaction bilaterally Cardiovascular:     Rate and Rhythm: Normal rate and regular rhythm.  Pulmonary:     Effort: Pulmonary effort is normal.     Breath sounds: Normal breath sounds.  Neurological:     Mental Status: He is alert.        Assessment:     #1 bilateral cerumen impaction  #2 hypertension stable    Plan:     -We recommend consideration for irrigation of both ears and discussed risk including risk of bleeding, pain, perforation.  Patient consented.  Irrigation per nurse with good success and removal of cerumen.  Pt tolerated well.   -Refill amlodipine for 1 year  -Flu vaccine already given  -Recommend 65-month follow-up.  Check basic metabolic panel at that time.  [name] MD [facility] at [facility]

## 2019-02-05 NOTE — Patient Instructions (Signed)
Earwax Buildup, Adult The ears produce a substance called earwax that helps keep bacteria out of the ear and protects the skin in the ear canal. Occasionally, earwax can build up in the ear and cause discomfort or hearing loss. What increases the risk? This condition is more likely to develop in people who:  Are male.  Are elderly.  Naturally produce more earwax.  Clean their ears often with cotton swabs.  Use earplugs often.  Use in-ear headphones often.  Wear hearing aids.  Have narrow ear canals.  Have earwax that is overly thick or sticky.  Have eczema.  Are dehydrated.  Have excess hair in the ear canal. What are the signs or symptoms? Symptoms of this condition include:  Reduced or muffled hearing.  A feeling of fullness in the ear or feeling that the ear is plugged.  Fluid coming from the ear.  Ear pain.  Ear itch.  Ringing in the ear.  Coughing.  An obvious piece of earwax that can be seen inside the ear canal. How is this diagnosed? This condition may be diagnosed based on:  Your symptoms.  Your medical history.  An ear exam. During the exam, your health care provider will look into your ear with an instrument called an otoscope. You may have tests, including a hearing test. How is this treated? This condition may be treated by:  Using ear drops to soften the earwax.  Having the earwax removed by a health care provider. The health care provider may: ? Flush the ear with water. ? Use an instrument that has a loop on the end (curette). ? Use a suction device.  Surgery to remove the wax buildup. This may be done in severe cases. Follow these instructions at home:   Take over-the-counter and prescription medicines only as told by your health care provider.  Do not put any objects, including cotton swabs, into your ear. You can clean the opening of your ear canal with a washcloth or facial tissue.  Follow instructions from your health care  provider about cleaning your ears. Do not over-clean your ears.  Drink enough fluid to keep your urine clear or pale yellow. This will help to thin the earwax.  Keep all follow-up visits as told by your health care provider. If earwax builds up in your ears often or if you use hearing aids, consider seeing your health care provider for routine, preventive ear cleanings. Ask your health care provider how often you should schedule your cleanings.  If you have hearing aids, clean them according to instructions from the manufacturer and your health care provider. Contact a health care provider if:  You have ear pain.  You develop a fever.  You have blood, pus, or other fluid coming from your ear.  You have hearing loss.  You have ringing in your ears that does not go away.  Your symptoms do not improve with treatment.  You feel like the room is spinning (vertigo). Summary  Earwax can build up in the ear and cause discomfort or hearing loss.  The most common symptoms of this condition include reduced or muffled hearing and a feeling of fullness in the ear or feeling that the ear is plugged.  This condition may be diagnosed based on your symptoms, your medical history, and an ear exam.  This condition may be treated by using ear drops to soften the earwax or by having the earwax removed by a health care provider.  Do not put any   objects, including cotton swabs, into your ear. You can clean the opening of your ear canal with a washcloth or facial tissue. This information is not intended to replace advice given to you by your health care provider. Make sure you discuss any questions you have with your health care provider. Document Released: 04/18/2004 Document Revised: 02/21/2017 Document Reviewed: 05/22/2016 Elsevier Patient Education  2020 [employer]  

## 2019-02-11 NOTE — Progress Notes (Signed)
[name] [address] [place], [place] [address] Phone: [phone] Subjective:   I Darin [name] am serving as a Education administrator for Dr. Hulan Saas.  This visit occurred during the SARS-CoV-2 public health emergency.  Safety protocols were in place, including screening questions prior to the visit, additional usage of staff PPE, and extensive cleaning of exam room while observing appropriate contact time as indicated for disinfecting solutions.    CC: Low back pain  [id number]   10/05/2018 Patient given injection.  Tolerated the procedure well.  I do believe that patient likely has more of a lumbar radiculopathy.  Encourage patient consider the gabapentin on a more regular basis.  Discussed icing regimen and home exercises.  Discussed which activities to do which will still avoid.  If no significant improvement need to consider the possibility of an epidural again.  Patient is in agreement with the plan.  Spent  25 minutes with patient face-to-face and had greater than 50% of counseling including as described above in assessment and plan.  02/12/2019 Darin [name] is a 85 y.o. male coming in with complaint of left hip pain. Patient is in pain and would like an injection.  Patient has had injection in the sacroiliac joint before and has responded well to it.  Patient is wondering if that would be beneficial again.     Past Medical History:  Diagnosis Date  . Arthritis   . GERD (gastroesophageal reflux disease)   . H/O hiatal hernia    "FOR YEARS" " NO PROBLEM"  . Hyperlipidemia   . Hypertension   . Knee pain   . Neuropathy    PERIPHERAL   . Osteoporosis   . PMR (polymyalgia rheumatica) (HCC)   . Polymyalgia ([place]) 5 YEARS   Past Surgical History:  Procedure Laterality Date  . CARPAL TUNNEL RELEASE    . CATARACT EXTRACTION     right  . COLONOSCOPY  2007  . INGUINAL HERNIA REPAIR  10/14/2011   Procedure: HERNIA REPAIR INGUINAL ADULT;  Surgeon:  [name], MD;  Location: [facility];  Service: General;  Laterality: Right;  Right Inguinal Hernia Repair with Mesh  . TONSILLECTOMY    . VARICOSE VEIN SURGERY     Social History   Socioeconomic History  . Marital status: Widowed    Spouse name: Not on file  . Number of children: Not on file  . Years of education: Not on file  . Highest education level: Not on file  Occupational History  . Occupation: retired    Fish farm manager: RETIRED  Social Needs  . Financial resource strain: Not on file  . Food insecurity    Worry: Not on file    Inability: Not on file  . Transportation needs    Medical: Not on file    Non-medical: Not on file  Tobacco Use  . Smoking status: Never Smoker  . Smokeless tobacco: Never Used  Substance and Sexual Activity  . Alcohol use: Yes    Alcohol/week: 3.0 standard drinks    Types: 3 Glasses of wine per week    Comment: half glass wine before dinner  . Drug use: No  . Sexual activity: Yes  Lifestyle  . Physical activity    Days per week: Not on file    Minutes per session: Not on file  . Stress: Not on file  Relationships  . Social Herbalist on phone: Not on file    Gets together:  Not on file    Attends religious service: Not on file    Active member of club or organization: Not on file    Attends meetings of clubs or organizations: Not on file    Relationship status: Not on file  Other Topics Concern  . Not on file  Social History Narrative  . Not on file   Allergies  Allergen Reactions  . Statins Other (See Comments)    Muscle pain and upset stomach   Family History  Problem Relation Age of Onset  . Heart disease Father   . Coronary artery disease Other      Current Outpatient Medications (Cardiovascular):  .  amLODipine (NORVASC) 5 MG tablet, Take 1 tablet (5 mg total) by mouth daily. .  benazepril-hydrochlorthiazide (LOTENSIN HCT) 10-12.5 MG tablet, TAKE 1 TABLET DAILY   Current Outpatient Medications  (Analgesics):  .  aspirin 81 MG tablet, Take 81 mg by mouth daily.    Current Outpatient Medications (Other):  .  famotidine (PEPCID) 20 MG tablet, TAKE 1 TABLET AT BEDTIME .  finasteride (PROSCAR) 5 MG tablet, TAKE 1 TABLET EACH DAY. Marland Kitchen  gabapentin (NEURONTIN) 100 MG capsule, TAKE 1 CAPSULE 4 TIMES     DAILY .  niacin 500 MG tablet, Take 500 mg by mouth 2 (two) times daily with a meal.  .  polyethylene glycol (MIRALAX / GLYCOLAX) packet, Take 17 g by mouth daily as needed for moderate constipation. .  triamcinolone ointment (KENALOG) 0.1 %, Apply 1 application topically 2 (two) times daily. To affected area .  vitamin C (ASCORBIC ACID) 500 MG tablet, Take 500 mg by mouth daily.    Past medical history, social, surgical and family history all reviewed in electronic medical record.  No pertanent information unless stated regarding to the chief complaint.   Review of Systems:  No headache, visual changes, nausea, vomiting, diarrhea, constipation, dizziness, abdominal pain, skin rash, fevers, chills, night sweats, weight loss, swollen lymph nodes, body aches, joint swelling,  chest pain, shortness of breath, mood changes.  Positive muscle aches  Objective  Blood pressure 110/60, pulse 78, height 6\' 1"  (1.854 m), SpO2 90 %.    General: No apparent distress alert and oriented x3 mood and affect normal, dressed appropriately.  HEENT: Pupils equal, extraocular movements intact  Respiratory: Patient's speak in full sentences and does not appear short of breath  Cardiovascular: Trace lower extremity edema, non tender, no erythema  Skin: Warm dry intact with no signs of infection or rash on extremities or on axial skeleton.  Abdomen: Soft nontender  Neuro: Cranial nerves II through XII are intact, neurovascularly intact in all extremities with 2+ DTRs and 2+ pulses.  Lymph: No lymphadenopathy of posterior or anterior cervical chain or axillae bilaterally.  Gait severely antalgic walking with the  aid of a walker MSK:  tender with  arthritic changes of multiple joints Back exam is severely tender but especially over the left sacroiliac joint.  Mild swelling noted in the area.  Unable to do [facility] test secondary to tightness.  Negative straight leg test.  Severe tenderness over the left greater trochanteric area.  After verbal consent patient was prepped with alcohol swab and with a 21-gauge 2 inch needle injected into the left sacroiliac joint with a total of 1 cc of 0.5% Marcaine and 1 cc of Kenalog 40 mg/mL.  No blood loss.  Postinjection instructions given  After verbal consent patient was prepped with alcohol swabs and with a 21-gauge 2 inch  needle injected into the left greater trochanteric bursa area.  Total of 1 cc of 0.5% Marcaine and 1 cc of Kenalog 40 mg/mL.  No blood loss.  Band-Aid placed.  Postinjection instructions given    Impression and Recommendations:     This case required medical decision making of moderate complexity. The above documentation has been reviewed and is accurate and complete Lyndal Pulley, DO       Note: This dictation was prepared with Dragon dictation along with smaller phrase technology. Any transcriptional errors that result from this process are unintentional.

## 2019-02-12 ENCOUNTER — Other Ambulatory Visit: Payer: Self-pay

## 2019-02-12 ENCOUNTER — Encounter: Payer: Self-pay | Admitting: Family Medicine

## 2019-02-12 ENCOUNTER — Ambulatory Visit (INDEPENDENT_AMBULATORY_CARE_PROVIDER_SITE_OTHER): Payer: Medicare Other | Admitting: Family Medicine

## 2019-02-12 DIAGNOSIS — G8929 Other chronic pain: Secondary | ICD-10-CM | POA: Diagnosis not present

## 2019-02-12 DIAGNOSIS — M533 Sacrococcygeal disorders, not elsewhere classified: Secondary | ICD-10-CM

## 2019-02-12 DIAGNOSIS — M7062 Trochanteric bursitis, left hip: Secondary | ICD-10-CM

## 2019-02-12 NOTE — Assessment & Plan Note (Signed)
Patient given an injection today.  Tolerated the procedure well.  Has responded before tenderness and hopefully will be beneficial.  Otherwise patient will need to consider the epidural.  Follow-up again in 4 to 8 weeks

## 2019-02-12 NOTE — Assessment & Plan Note (Signed)
Patient given injection.  Discussed icing regimen and home exercises, discussed topical anti-inflammatories.  Discussed home exercises.  Follow-up again in 4 to 8 weeks

## 2019-03-05 ENCOUNTER — Other Ambulatory Visit: Payer: Self-pay | Admitting: Family Medicine

## 2019-04-02 ENCOUNTER — Ambulatory Visit (INDEPENDENT_AMBULATORY_CARE_PROVIDER_SITE_OTHER): Payer: Medicare Other | Admitting: Podiatry

## 2019-04-02 ENCOUNTER — Encounter: Payer: Self-pay | Admitting: Podiatry

## 2019-04-02 ENCOUNTER — Other Ambulatory Visit: Payer: Self-pay

## 2019-04-02 DIAGNOSIS — M79674 Pain in right toe(s): Secondary | ICD-10-CM

## 2019-04-02 DIAGNOSIS — M79675 Pain in left toe(s): Secondary | ICD-10-CM | POA: Diagnosis not present

## 2019-04-02 DIAGNOSIS — G609 Hereditary and idiopathic neuropathy, unspecified: Secondary | ICD-10-CM | POA: Diagnosis not present

## 2019-04-02 DIAGNOSIS — B351 Tinea unguium: Secondary | ICD-10-CM | POA: Diagnosis not present

## 2019-04-02 NOTE — Progress Notes (Signed)
Complaint:  Visit Type: Patient returns to my office for continued preventative foot care services. Complaint: Patient states" my nails have grown long and thick and become painful to walk and wear shoes" Patient has been diagnosed with neuropathy.. The patient presents for preventative foot care services. No changes to ROS.  Patient says his feet have improved wearing his powerstep insoles.  Podiatric Exam: Vascular: dorsalis pedis and posterior tibial pulses are palpable bilateral. Capillary return is immediate. Temperature gradient is WNL. Skin turgor WNL  Sensorium: Normal Semmes Weinstein monofilament test. Normal tactile sensation bilaterally. Nail Exam: Pt has thick disfigured discolored nails with subungual debris noted bilateral entire nail hallux through fifth toenails Ulcer Exam: There is no evidence of ulcer or pre-ulcerative changes or infection. Orthopedic Exam: Muscle tone and strength are WNL. No limitations in general ROM. No crepitus or effusions noted. Foot type and digits show no abnormalities. Bony prominences are unremarkable. Skin: No Porokeratosis. No infection or ulcers  Diagnosis:  Onychomycosis, , Pain in right toe, pain in left toes.    Treatment & Plan Procedures and Treatment: Consent by patient was obtained for treatment procedures. The patient understood the discussion of treatment and procedures well. All questions were answered thoroughly reviewed. Debridement of mycotic and hypertrophic toenails, 1 through 5 bilateral and clearing of subungual debris. No ulceration, no infection noted.  Return Visit-Office Procedure: Patient instructed to return to the office for a follow up visit 3 months for continued evaluation and treatment.    Gardiner Barefoot DPM

## 2019-04-07 ENCOUNTER — Other Ambulatory Visit: Payer: Self-pay

## 2019-04-07 ENCOUNTER — Ambulatory Visit: Payer: Medicare Other | Admitting: Family Medicine

## 2019-04-07 NOTE — Progress Notes (Deleted)
[facility] [place] Phone: [phone] Subjective:   Darin [name], am serving as a scribe for Dr. Hulan Saas. This visit occurred during the SARS-CoV-2 public health emergency.  Safety protocols were in place, including screening questions prior to the visit, additional usage of staff PPE, and extensive cleaning of exam room while observing appropriate contact time as indicated for disinfecting solutions.   I'm seeing this patient by the request  of:    CC:   [id number]   02/12/2019 SI joint: Patient given an injection today.  Tolerated the procedure well.  Has responded before tenderness and hopefully will be beneficial.  Otherwise patient will need to consider the epidural.  Follow-up again in 4 to 8 weeks  Hip GT: Patient given injection.  Discussed icing regimen and home exercises, discussed topical anti-inflammatories.  Discussed home exercises.  Follow-up again in 4 to 8 weeks  Update 04/07/2019 Darin [name] is a 86 y.o. male coming in with complaint of hip and back pain. Patient states      Past Medical History:  Diagnosis Date  . Arthritis   . GERD (gastroesophageal reflux disease)   . H/O hiatal hernia    "FOR YEARS" " [name]"  . Hyperlipidemia   . Hypertension   . Knee pain   . Neuropathy    PERIPHERAL   . Osteoporosis   . PMR (polymyalgia rheumatica) (HCC)   . Polymyalgia ([place]) 5 YEARS   Past Surgical History:  Procedure Laterality Date  . CARPAL TUNNEL RELEASE    . CATARACT EXTRACTION     right  . COLONOSCOPY  2007  . INGUINAL HERNIA REPAIR  10/14/2011   Procedure: HERNIA REPAIR INGUINAL ADULT;  Surgeon: [name], MD;  Location: [facility];  Service: General;  Laterality: Right;  Right Inguinal Hernia Repair with Mesh  . TONSILLECTOMY    . VARICOSE VEIN SURGERY     Social History   Socioeconomic History  . Marital status: Widowed    Spouse name: Not on file  . Number of  children: Not on file  . Years of education: Not on file  . Highest education level: Not on file  Occupational History  . Occupation: retired    Fish farm manager: RETIRED  Tobacco Use  . Smoking status: Never Smoker  . Smokeless tobacco: Never Used  Substance and Sexual Activity  . Alcohol use: Yes    Alcohol/week: 3.0 standard drinks    Types: 3 Glasses of wine per week    Comment: half glass wine before dinner  . Drug use: [name]  . Sexual activity: Yes  Other Topics Concern  . Not on file  Social History Narrative  . Not on file   Social Determinants of Health   Financial Resource Strain:   . Difficulty of Paying Living Expenses: Not on file  Food Insecurity:   . Worried About Charity fundraiser in the Last Year: Not on file  . Ran Out of Food in the Last Year: Not on file  Transportation Needs:   . Lack of Transportation (Medical): Not on file  . Lack of Transportation (Non-Medical): Not on file  Physical Activity:   . Days of Exercise per Week: Not on file  . Minutes of Exercise per Session: Not on file  Stress:   . Feeling of Stress : Not on file  Social Connections:   . Frequency of Communication with Friends and Family: Not on file  .  Frequency of Social Gatherings with Friends and Family: Not on file  . Attends Religious Services: Not on file  . Active Member of Clubs or Organizations: Not on file  . Attends Archivist Meetings: Not on file  . Marital Status: Not on file   Allergies  Allergen Reactions  . Statins Other (See Comments)    Muscle pain and upset stomach   Family History  Problem Relation Age of Onset  . Heart disease Father   . Coronary artery disease Other      Current Outpatient Medications (Cardiovascular):  .  amLODipine (NORVASC) 5 MG tablet, Take 1 tablet (5 mg total) by mouth daily. .  benazepril-hydrochlorthiazide (LOTENSIN HCT) 10-12.5 MG tablet, TAKE 1 TABLET DAILY   Current Outpatient Medications (Analgesics):  .  aspirin  81 MG tablet, Take 81 mg by mouth daily.    Current Outpatient Medications (Other):  .  famotidine (PEPCID) 20 MG tablet, TAKE 1 TABLET AT BEDTIME .  finasteride (PROSCAR) 5 MG tablet, TAKE 1 TABLET EACH DAY. Marland Kitchen  gabapentin (NEURONTIN) 100 MG capsule, TAKE 1 CAPSULE 4 TIMES     DAILY .  mupirocin ointment (BACTROBAN) 2 %,  .  niacin 500 MG tablet, Take 500 mg by mouth 2 (two) times daily with a meal.  .  polyethylene glycol (MIRALAX / GLYCOLAX) packet, Take 17 g by mouth daily as needed for moderate constipation. .  triamcinolone ointment (KENALOG) 0.1 %, Apply 1 application topically 2 (two) times daily. To affected area .  vitamin C (ASCORBIC ACID) 500 MG tablet, Take 500 mg by mouth daily.    Past medical history, social, surgical and family history all reviewed in electronic medical record.  [name] unless stated regarding to the chief complaint.   Review of Systems:  [name] headache, visual changes, nausea, vomiting, diarrhea, constipation, dizziness, abdominal pain, skin rash, fevers, chills, night sweats, weight loss, swollen lymph nodes, body aches, joint swelling, muscle aches, chest pain, shortness of breath, mood changes.   Objective  There were [name] vitals taken for this visit. Systems examined below as of    General: [name] apparent distress alert and oriented x3 mood and affect normal, dressed appropriately.  HEENT: Pupils equal, extraocular movements intact  Respiratory: Patient's speak in full sentences and does not appear short of breath  Cardiovascular: [name] lower extremity edema, non tender, [name] erythema  Skin: Warm dry intact with [name] signs of infection or rash on extremities or on axial skeleton.  Abdomen: Soft nontender  Neuro: Cranial nerves II through XII are intact, neurovascularly intact in all extremities with 2+ DTRs and 2+ pulses.  Lymph: [name] lymphadenopathy of posterior or anterior cervical chain or axillae bilaterally.  Gait normal with good balance and  coordination.  MSK:  Non tender with full range of motion and good stability and symmetric strength and tone of shoulders, elbows, wrist, hip, knee and ankles bilaterally.     Impression and Recommendations:     This case required medical decision making of moderate complexity. The above documentation has been reviewed and is accurate and complete Lyndal Pulley, DO       Note: This dictation was prepared with Dragon dictation along with smaller phrase technology. Any transcriptional errors that result from this process are unintentional.

## 2019-04-14 ENCOUNTER — Other Ambulatory Visit: Payer: Self-pay

## 2019-04-14 ENCOUNTER — Encounter: Payer: Self-pay | Admitting: Family Medicine

## 2019-04-14 ENCOUNTER — Ambulatory Visit (INDEPENDENT_AMBULATORY_CARE_PROVIDER_SITE_OTHER): Payer: Medicare Other | Admitting: Family Medicine

## 2019-04-14 VITALS — BP 118/62 | HR 83 | Ht 73.0 in | Wt 197.0 lb

## 2019-04-14 DIAGNOSIS — M1711 Unilateral primary osteoarthritis, right knee: Secondary | ICD-10-CM

## 2019-04-14 DIAGNOSIS — M2550 Pain in unspecified joint: Secondary | ICD-10-CM

## 2019-04-14 DIAGNOSIS — S76312D Strain of muscle, fascia and tendon of the posterior muscle group at thigh level, left thigh, subsequent encounter: Secondary | ICD-10-CM

## 2019-04-14 MED ORDER — METHYLPREDNISOLONE ACETATE 40 MG/ML IJ SUSP
40.0000 mg | Freq: Once | INTRAMUSCULAR | Status: AC
  Administered 2019-04-14: 40 mg via INTRAMUSCULAR

## 2019-04-14 MED ORDER — KETOROLAC TROMETHAMINE 30 MG/ML IJ SOLN
30.0000 mg | Freq: Once | INTRAMUSCULAR | Status: AC
  Administered 2019-04-14: 30 mg via INTRAMUSCULAR

## 2019-04-14 NOTE — Progress Notes (Signed)
[facility] [address] [place] Phone: [phone] Subjective:   I Darin [name] am serving as a Education administrator for Dr. Hulan Saas.  This visit occurred during the SARS-CoV-2 public health emergency.  Safety protocols were in place, including screening questions prior to the visit, additional usage of staff PPE, and extensive cleaning of exam room while observing appropriate contact time as indicated for disinfecting solutions.   I'm seeing this patient by the request  of:  [name], MD  CC:   [id number]   02/12/2019 Patient given an injection today.  Tolerated the procedure well.  Has responded before tenderness and hopefully will be beneficial.  Otherwise patient will need to consider the epidural.  Follow-up again in 4 to 8 weeks  Patient given injection.  Discussed icing regimen and home exercises, discussed topical anti-inflammatories.  Discussed home exercises.  Follow-up again in 4 to 8 weeks  04/14/2019 Darin [name] is a 86 y.o. male coming in with complaint of left hip pain.   Onset-  Location Duration-  Character- Aggravating factors- Reliving factors-  Therapies tried-  Severity-     Past Medical History:  Diagnosis Date  . Arthritis   . GERD (gastroesophageal reflux disease)   . H/O hiatal hernia    "FOR YEARS" " NO PROBLEM"  . Hyperlipidemia   . Hypertension   . Knee pain   . Neuropathy    PERIPHERAL   . Osteoporosis   . PMR (polymyalgia rheumatica) (HCC)   . Polymyalgia ([place]) 5 YEARS   Past Surgical History:  Procedure Laterality Date  . CARPAL TUNNEL RELEASE    . CATARACT EXTRACTION     right  . COLONOSCOPY  2007  . INGUINAL HERNIA REPAIR  10/14/2011   Procedure: HERNIA REPAIR INGUINAL ADULT;  Surgeon: [name], MD;  Location: [facility];  Service: General;  Laterality: Right;  Right Inguinal Hernia Repair with Mesh  . TONSILLECTOMY    . VARICOSE VEIN SURGERY     Social History    Socioeconomic History  . Marital status: Widowed    Spouse name: Not on file  . Number of children: Not on file  . Years of education: Not on file  . Highest education level: Not on file  Occupational History  . Occupation: retired    Fish farm manager: RETIRED  Tobacco Use  . Smoking status: Never Smoker  . Smokeless tobacco: Never Used  Substance and Sexual Activity  . Alcohol use: Yes    Alcohol/week: 3.0 standard drinks    Types: 3 Glasses of wine per week    Comment: half glass wine before dinner  . Drug use: No  . Sexual activity: Yes  Other Topics Concern  . Not on file  Social History Narrative  . Not on file   Social Determinants of Health   Financial Resource Strain:   . Difficulty of Paying Living Expenses: Not on file  Food Insecurity:   . Worried About Charity fundraiser in the Last Year: Not on file  . Ran Out of Food in the Last Year: Not on file  Transportation Needs:   . Lack of Transportation (Medical): Not on file  . Lack of Transportation (Non-Medical): Not on file  Physical Activity:   . Days of Exercise per Week: Not on file  . Minutes of Exercise per Session: Not on file  Stress:   . Feeling of Stress : Not on file  Social Connections:   .  Frequency of Communication with Friends and Family: Not on file  . Frequency of Social Gatherings with Friends and Family: Not on file  . Attends Religious Services: Not on file  . Active Member of Clubs or Organizations: Not on file  . Attends Archivist Meetings: Not on file  . Marital Status: Not on file   Allergies  Allergen Reactions  . Statins Other (See Comments)    Muscle pain and upset stomach   Family History  Problem Relation Age of Onset  . Heart disease Father   . Coronary artery disease Other      Current Outpatient Medications (Cardiovascular):  .  amLODipine (NORVASC) 5 MG tablet, Take 1 tablet (5 mg total) by mouth daily. .  benazepril-hydrochlorthiazide (LOTENSIN HCT)  10-12.5 MG tablet, TAKE 1 TABLET DAILY   Current Outpatient Medications (Analgesics):  .  aspirin 81 MG tablet, Take 81 mg by mouth daily.    Current Outpatient Medications (Other):  .  famotidine (PEPCID) 20 MG tablet, TAKE 1 TABLET AT BEDTIME .  finasteride (PROSCAR) 5 MG tablet, TAKE 1 TABLET EACH DAY. Marland Kitchen  gabapentin (NEURONTIN) 100 MG capsule, TAKE 1 CAPSULE 4 TIMES     DAILY .  mupirocin ointment (BACTROBAN) 2 %,  .  niacin 500 MG tablet, Take 500 mg by mouth 2 (two) times daily with a meal.  .  polyethylene glycol (MIRALAX / GLYCOLAX) packet, Take 17 g by mouth daily as needed for moderate constipation. .  triamcinolone ointment (KENALOG) 0.1 %, Apply 1 application topically 2 (two) times daily. To affected area .  vitamin C (ASCORBIC ACID) 500 MG tablet, Take 500 mg by mouth daily.    Past medical history, social, surgical and family history all reviewed in electronic medical record.  No pertanent information unless stated regarding to the chief complaint.   Review of Systems:  No headache, visual changes, nausea, vomiting, diarrhea, constipation, dizziness, abdominal pain, skin rash, fevers, chills, night sweats, weight loss, swollen lymph nodes, body aches, joint swelling, chest pain, shortness of breath, mood changes. POSITIVE muscle aches  Objective  There were no vitals taken for this visit.   General: No apparent distress alert and oriented x3 mood and affect normal, dressed appropriately.  HEENT: Pupils equal, extraocular movements intact  Respiratory: Patient's speak in full sentences and does not appear short of breath  Cardiovascular: No lower extremity edema, non tender, no erythema  Skin: Warm dry intact with no signs of infection or rash on extremities or on axial skeleton.  Abdomen: Soft nontender  Neuro: Cranial nerves II through XII are intact, neurovascularly intact in all extremities with 2+ DTRs and 2+ pulses.  Lymph: No lymphadenopathy of posterior or  anterior cervical chain or axillae bilaterally.  Gait normal with good balance and coordination.  MSK:  Non tender with full range of motion and good stability and symmetric strength and tone of shoulders, elbows, wrist, hip, knee and ankles bilaterally.     Impression and Recommendations:     This case required medical decision making of moderate complexity. The above documentation has been reviewed and is accurate and complete Darin [name]       Note: This dictation was prepared with Dragon dictation along with smaller phrase technology. Any transcriptional errors that result from this process are unintentional.

## 2019-04-14 NOTE — Progress Notes (Signed)
[place] Phone: [phone] Subjective:   [name], am serving as a scribe for Dr. Hulan Saas. This visit occurred during the SARS-CoV-2 public health emergency.  Safety protocols were in place, including screening questions prior to the visit, additional usage of staff PPE, and extensive cleaning of exam room while observing appropriate contact time as indicated for disinfecting solutions.   I'm seeing this patient by the request  of:  [name], MD  CC: Knee pain, back pain follow-up  [id number]  Darin [name] is a 86 y.o. male coming in with complaint of right knee and left hip pain. Constant pain in the left glute. Right knee is unstable and gives out on him but is not painful. Was unable to sleep after injection last visit for a couple of days.  Patient feels good about the knee.  Continues to have back pain now that seems to give him more the discomfort.  States it is more in the left gluteal area.  Difficult to assess if it is coming from his back he states.  Never without some discomfort.      Past Medical History:  Diagnosis Date  . Arthritis   . GERD (gastroesophageal reflux disease)   . H/O hiatal hernia    "FOR YEARS" " NO PROBLEM"  . Hyperlipidemia   . Hypertension   . Knee pain   . Neuropathy    PERIPHERAL   . Osteoporosis   . PMR (polymyalgia rheumatica) (HCC)   . Polymyalgia ([place]) 5 YEARS   Past Surgical History:  Procedure Laterality Date  . CARPAL TUNNEL RELEASE    . CATARACT EXTRACTION     right  . COLONOSCOPY  2007  . INGUINAL HERNIA REPAIR  10/14/2011   Procedure: HERNIA REPAIR INGUINAL ADULT;  Surgeon: [name], MD;  Location: [facility];  Service: General;  Laterality: Right;  Right Inguinal Hernia Repair with Mesh  . TONSILLECTOMY    . VARICOSE VEIN SURGERY     Social History   Socioeconomic History  . Marital status: Widowed    Spouse name: Not  on file  . Number of children: Not on file  . Years of education: Not on file  . Highest education level: Not on file  Occupational History  . Occupation: retired    Fish farm manager: RETIRED  Tobacco Use  . Smoking status: Never Smoker  . Smokeless tobacco: Never Used  Substance and Sexual Activity  . Alcohol use: Yes    Alcohol/week: 3.0 standard drinks    Types: 3 Glasses of wine per week    Comment: half glass wine before dinner  . Drug use: No  . Sexual activity: Yes  Other Topics Concern  . Not on file  Social History Narrative  . Not on file   Social Determinants of Health   Financial Resource Strain:   . Difficulty of Paying Living Expenses: Not on file  Food Insecurity:   . Worried About Charity fundraiser in the Last Year: Not on file  . Ran Out of Food in the Last Year: Not on file  Transportation Needs:   . Lack of Transportation (Medical): Not on file  . Lack of Transportation (Non-Medical): Not on file  Physical Activity:   . Days of Exercise per Week: Not on file  . Minutes of Exercise per Session: Not on file  Stress:   . Feeling of Stress : Not on file  Social Connections:   . Frequency of Communication with Friends and Family: Not on file  . Frequency of Social Gatherings with Friends and Family: Not on file  . Attends Religious Services: Not on file  . Active Member of Clubs or Organizations: Not on file  . Attends Archivist Meetings: Not on file  . Marital Status: Not on file   Allergies  Allergen Reactions  . Statins Other (See Comments)    Muscle pain and upset stomach   Family History  Problem Relation Age of Onset  . Heart disease Father   . Coronary artery disease Other      Current Facility-Administered Medications (Endocrine & Metabolic):  .  methylPREDNISolone acetate (DEPO-MEDROL) injection 40 mg  Current Outpatient Medications (Cardiovascular):  .  amLODipine (NORVASC) 5 MG tablet, Take 1 tablet (5 mg total) by mouth  daily. .  benazepril-hydrochlorthiazide (LOTENSIN HCT) 10-12.5 MG tablet, TAKE 1 TABLET DAILY     Current Outpatient Medications (Analgesics):  .  aspirin 81 MG tablet, Take 81 mg by mouth daily.   Current Facility-Administered Medications (Analgesics):  .  ketorolac (TORADOL) 30 MG/ML injection 30 mg    Current Outpatient Medications (Other):  .  famotidine (PEPCID) 20 MG tablet, TAKE 1 TABLET AT BEDTIME .  finasteride (PROSCAR) 5 MG tablet, TAKE 1 TABLET EACH DAY. Marland Kitchen  gabapentin (NEURONTIN) 100 MG capsule, TAKE 1 CAPSULE 4 TIMES     DAILY .  mupirocin ointment (BACTROBAN) 2 %,  .  niacin 500 MG tablet, Take 500 mg by mouth 2 (two) times daily with a meal.  .  polyethylene glycol (MIRALAX / GLYCOLAX) packet, Take 17 g by mouth daily as needed for moderate constipation. .  triamcinolone ointment (KENALOG) 0.1 %, Apply 1 application topically 2 (two) times daily. To affected area .  vitamin C (ASCORBIC ACID) 500 MG tablet, Take 500 mg by mouth daily.     Past medical history, social, surgical and family history all reviewed in electronic medical record.  No pertanent information unless stated regarding to the chief complaint.   Review of Systems:  No headache, visual changes, nausea, vomiting, diarrhea, constipation, dizziness, abdominal pain, skin rash, fevers, chills, night sweats, weight loss, swollen lymph nodes, body aches, joint swelling, chest pain, shortness of breath, mood changes. POSITIVE muscle aches  Objective  Blood pressure 118/62, pulse 83, height 6\' 1"  (1.854 m), weight 197 lb (89.4 kg), SpO2 99 %.   General: No apparent distress alert and oriented x3 mood and affect normal, dressed appropriately.  HEENT: Pupils equal, extraocular movements intact  Respiratory: Patient's speak in full sentences and does not appear short of breath  Cardiovascular: No lower extremity edema, non tender, no erythema  Skin: Warm dry intact with no signs of infection or rash on  extremities or on axial skeleton.  Abdomen: Soft nontender  Neuro: Cranial nerves II through XII are intact, neurovascularly intact in all extremities with 2+ DTRs and 2+ pulses.  Lymph: No lymphadenopathy of posterior or anterior cervical chain or axillae bilaterally.  Gait walking with the aid of a walker MSK:  tender with limited range of motion arthritic changes of multiple joints  Right knee exam still has significant instability with varus and poor valgus force.  Patient does have limited range of motion lacking last 5 degrees of extension last 10 degrees of flexion.  Contralateral knee arthritic changes but no instability  Back exam does have some degenerative scoliosis.  Tenderness over the left sacroiliac joint that  is moderate to severe as well as in the left gluteal area.  Some moderate tenderness in the paraspinal musculature lumbar spine as well.  Unable to do [facility] test secondary to tightness and pain      Impression and Recommendations:     This case required medical decision making of moderate complexity. The above documentation has been reviewed and is accurate and complete Lyndal Pulley, DO       Note: This dictation was prepared with Dragon dictation along with smaller phrase technology. Any transcriptional errors that result from this process are unintentional.

## 2019-04-14 NOTE — Assessment & Plan Note (Signed)
Patient continues to have some discomfort and pain and has not responded as well to the injections previously.  We discussed icing regimen and home exercise.  Discussed the possibility of epidurals.  We discussed gabapentin.  Discussed icing regimen and home exercises, topical anti-inflammatories.  Follow-up again in 4 to 8 weeks

## 2019-04-14 NOTE — Assessment & Plan Note (Signed)
Patient is doing relatively well at this point.  Continues to have instability.  Has a custom brace but only wears it intermittently.  Patient will continue to monitor at this time and no need for another injection.

## 2019-04-14 NOTE — Patient Instructions (Signed)
Keep going to gym See me in 8 weeks

## 2019-05-19 ENCOUNTER — Other Ambulatory Visit: Payer: Self-pay | Admitting: Family Medicine

## 2019-05-19 MED ORDER — FAMOTIDINE 20 MG PO TABS: 20.0000 mg | ORAL_TABLET | Freq: Every day | ORAL | 1 refills | Status: DC

## 2019-05-19 NOTE — Telephone Encounter (Signed)
Medication refill: Famotidine Pharmacy: [facility] [phone]

## 2019-05-19 NOTE — Telephone Encounter (Signed)
Refill sent.

## 2019-05-23 ENCOUNTER — Other Ambulatory Visit: Payer: Self-pay | Admitting: Family Medicine

## 2019-05-24 NOTE — Telephone Encounter (Signed)
Refill for 6 months. 

## 2019-05-26 ENCOUNTER — Other Ambulatory Visit: Payer: Self-pay | Admitting: Family Medicine

## 2019-06-09 ENCOUNTER — Other Ambulatory Visit: Payer: Self-pay

## 2019-06-09 ENCOUNTER — Telehealth: Payer: Self-pay | Admitting: Family Medicine

## 2019-06-09 ENCOUNTER — Encounter: Payer: Self-pay | Admitting: Family Medicine

## 2019-06-09 ENCOUNTER — Ambulatory Visit (INDEPENDENT_AMBULATORY_CARE_PROVIDER_SITE_OTHER): Payer: Medicare Other | Admitting: Family Medicine

## 2019-06-09 DIAGNOSIS — M1711 Unilateral primary osteoarthritis, right knee: Secondary | ICD-10-CM

## 2019-06-09 NOTE — Telephone Encounter (Signed)
Pt is requesting a refill on Amlodipine 5 mg tablet sent to [place]. Thanks

## 2019-06-09 NOTE — Progress Notes (Signed)
[place] Phone: [phone] Subjective:   Darin [name], am serving as a scribe for Dr. Hulan Saas. This visit occurred during the SARS-CoV-2 public health emergency.  Safety protocols were in place, including screening questions prior to the visit, additional usage of staff PPE, and extensive cleaning of exam room while observing appropriate contact time as indicated for disinfecting solutions.   I'm seeing this patient by the request  of:  [name], MD  CC: right knee pain   [id number]   04/14/2019 Patient is doing relatively well at this point.  Continues to have instability.  Has a custom brace but only wears it intermittently.  Patient will continue to monitor at this time and [name] need for another injection.  Patient continues to have some discomfort and pain and has not responded as well to the injections previously.  We discussed icing regimen and home exercise.  Discussed the possibility of epidurals.  We discussed gabapentin.  Discussed icing regimen and home exercises, topical anti-inflammatories.  Follow-up again in 4 to 8 weeks  Update 06/09/2019 Darin [name] is a 86 y.o. male coming in with complaint of right knee pain has been increasing recently. Pain over the anterior aspect. Did have hamstring spasms last night.   Chronic left hip pain. Pain is eased with steroid injections. But minimal. Never without pain.      Past Medical History:  Diagnosis Date  . Arthritis   . GERD (gastroesophageal reflux disease)   . H/O hiatal hernia    "FOR YEARS" " [name]"  . Hyperlipidemia   . Hypertension   . Knee pain   . Neuropathy    PERIPHERAL   . Osteoporosis   . PMR (polymyalgia rheumatica) (HCC)   . Polymyalgia ([place]) 5 YEARS   Past Surgical History:  Procedure Laterality Date  . CARPAL TUNNEL RELEASE    . CATARACT EXTRACTION     right  . COLONOSCOPY  2007  . INGUINAL HERNIA  REPAIR  10/14/2011   Procedure: HERNIA REPAIR INGUINAL ADULT;  Surgeon: [name], MD;  Location: [facility];  Service: General;  Laterality: Right;  Right Inguinal Hernia Repair with Mesh  . TONSILLECTOMY    . VARICOSE VEIN SURGERY     Social History   Socioeconomic History  . Marital status: Widowed    Spouse name: Not on file  . Number of children: Not on file  . Years of education: Not on file  . Highest education level: Not on file  Occupational History  . Occupation: retired    Fish farm manager: RETIRED  Tobacco Use  . Smoking status: Never Smoker  . Smokeless tobacco: Never Used  Substance and Sexual Activity  . Alcohol use: Yes    Alcohol/week: 3.0 standard drinks    Types: 3 Glasses of wine per week    Comment: half glass wine before dinner  . Drug use: [name]  . Sexual activity: Yes  Other Topics Concern  . Not on file  Social History Narrative  . Not on file   Social Determinants of Health   Financial Resource Strain:   . Difficulty of Paying Living Expenses:   Food Insecurity:   . Worried About Charity fundraiser in the Last Year:   . Arboriculturist in the Last Year:   Transportation Needs:   . Film/video editor (Medical):   [name]:   Physical Activity:   .  Days of Exercise per Week:   . Minutes of Exercise per Session:   Stress:   . Feeling of Stress :   Social Connections:   . Frequency of Communication with Friends and Family:   . Frequency of Social Gatherings with Friends and Family:   . Attends Religious Services:   . Active Member of Clubs or Organizations:   . Attends Archivist Meetings:   Marland Kitchen Marital Status:    Allergies  Allergen Reactions  . Statins Other (See Comments)    Muscle pain and upset stomach   Family History  Problem Relation Age of Onset  . Heart disease Father   . Coronary artery disease Other      Current Outpatient Medications (Cardiovascular):  .  amLODipine (NORVASC) 5 MG  tablet, Take 1 tablet (5 mg total) by mouth daily. .  benazepril-hydrochlorthiazide (LOTENSIN HCT) 10-12.5 MG tablet, TAKE 1 TABLET DAILY   Current Outpatient Medications (Analgesics):  .  aspirin 81 MG tablet, Take 81 mg by mouth daily.    Current Outpatient Medications (Other):  .  famotidine (PEPCID) 20 MG tablet, Take 1 tablet (20 mg total) by mouth at bedtime. .  finasteride (PROSCAR) 5 MG tablet, TAKE 1 TABLET EACH DAY. Marland Kitchen  gabapentin (NEURONTIN) 100 MG capsule, TAKE 1 CAPSULE 4 TIMES     DAILY .  mupirocin ointment (BACTROBAN) 2 %,  .  niacin 500 MG tablet, Take 500 mg by mouth 2 (two) times daily with a meal.  .  polyethylene glycol (MIRALAX / GLYCOLAX) packet, Take 17 g by mouth daily as needed for moderate constipation. .  triamcinolone ointment (KENALOG) 0.1 %, Apply 1 application topically 2 (two) times daily. To affected area .  vitamin C (ASCORBIC ACID) 500 MG tablet, Take 500 mg by mouth daily.   Reviewed prior external information including notes and imaging from  primary care provider As well as notes that were available from care everywhere and other healthcare systems.  Past medical history, social, surgical and family history all reviewed in electronic medical record.  [name] unless stated regarding to the chief complaint.   Review of Systems:  [name] headache, visual changes, nausea, vomiting, diarrhea, constipation, dizziness, abdominal pain, skin rash, fevers, chills, night sweats, weight loss, swollen lymph nodes, body aches, joint swelling, chest pain, shortness of breath, mood changes. POSITIVE muscle aches  Objective  Blood pressure (!) 110/52, pulse 86, height 6\' 1"  (1.854 m), weight 198 lb (89.8 kg), SpO2 99 %.   General: [name] apparent distress alert and oriented x3 mood and affect normal, dressed appropriately.  HEENT: Pupils equal, extraocular movements intact  Gait antalgic walking with the aid of a walker   Knee: Right valgus deformity  noted.  Abnormal thigh to calf ratio.  Tender to palpation over medial and PF joint line.  Lacks last 5 degrees of extension and flexion. instability with valgus force.  painful patellar compression. Patellar glide with moderate crepitus. Patellar and quadriceps tendons unremarkable. Hamstring and quadriceps strength is normal.  After informed written and verbal consent, patient was seated on exam table. Right knee was prepped with alcohol swab and utilizing anterolateral approach, patient's right knee space was injected with 4:1  marcaine 0.5%: Kenalog 40mg /dL. Patient tolerated the procedure well without immediate complications.   Impression and Recommendations:     This case required medical decision making of moderate complexity. The above documentation has been reviewed and is accurate and complete Lyndal Pulley, DO  Note: This dictation was prepared with Dragon dictation along with smaller phrase technology. Any transcriptional errors that result from this process are unintentional.

## 2019-06-09 NOTE — Assessment & Plan Note (Addendum)
repeat steroid injection given June 09, 2019 repeat injection given today, tolerated procedure well, discussed icing regimen and home exercises.  Chronic problem with exacerbation.  Patient did initially responded fairly decently to PRP and we will consider that again as well.  Patient did not respond well to viscosupplementation.  Patient will see me again in 4 to 8 weeks.  Social determinants of health including patient has difficulty with transportation and is taking care of himself alone.

## 2019-06-09 NOTE — Telephone Encounter (Signed)
Pt has been notified that we sent a year supply in November and he needs to call the pharmacy for the refill

## 2019-06-09 NOTE — Patient Instructions (Addendum)
Injected knee today See me in 4 weeks in case hip is not better

## 2019-07-01 ENCOUNTER — Telehealth: Payer: Self-pay | Admitting: Family Medicine

## 2019-07-01 NOTE — Telephone Encounter (Signed)
Pt informed that [facility] is requesting that his provider confirm that he is still taking Gabapentin.   Pt also stated that this request may be needed for all his prescriptions.

## 2019-07-02 ENCOUNTER — Other Ambulatory Visit: Payer: Self-pay

## 2019-07-02 ENCOUNTER — Ambulatory Visit (INDEPENDENT_AMBULATORY_CARE_PROVIDER_SITE_OTHER): Payer: Medicare Other | Admitting: Podiatry

## 2019-07-02 ENCOUNTER — Encounter: Payer: Self-pay | Admitting: Podiatry

## 2019-07-02 VITALS — Temp 97.4°F

## 2019-07-02 DIAGNOSIS — B351 Tinea unguium: Secondary | ICD-10-CM | POA: Diagnosis not present

## 2019-07-02 DIAGNOSIS — M79674 Pain in right toe(s): Secondary | ICD-10-CM | POA: Diagnosis not present

## 2019-07-02 DIAGNOSIS — G609 Hereditary and idiopathic neuropathy, unspecified: Secondary | ICD-10-CM | POA: Diagnosis not present

## 2019-07-02 DIAGNOSIS — M79675 Pain in left toe(s): Secondary | ICD-10-CM

## 2019-07-02 NOTE — Progress Notes (Signed)
This patient returns to my office for at risk foot care.  This patient requires this care by a professional since this patient will be at risk due to having idiopathic neuropathy.  This patient is unable to cut nails himself since the patient cannot reach his nails.These nails are painful walking and wearing shoes.  This patient presents for at risk foot care today. ° °General Appearance  Alert, conversant and in no acute stress. ° °Vascular  Dorsalis pedis and posterior tibial  pulses are weakly  palpable  bilaterally.  Capillary return is within normal limits  bilaterally. Temperature is within normal limits  bilaterally. ° °Neurologic  Senn-Weinstein monofilament wire test within normal limits  bilaterally. Muscle power within normal limits bilaterally. ° °Nails Thick disfigured discolored nails with subungual debris  from hallux to fifth toes bilaterally. Pincer nails  B/L. No evidence of bacterial infection or drainage bilaterally. ° °Orthopedic  No limitations of motion  feet .  No crepitus or effusions noted.  No bony pathology or digital deformities noted. ° °Skin  normotropic skin with no porokeratosis noted bilaterally.  No signs of infections or ulcers noted.    ° °Onychomycosis  Pain in right toes  Pain in left toes ° °Consent was obtained for treatment procedures.   Mechanical debridement of nails 1-5  bilaterally performed with a nail nipper.  Filed with dremel without incident.  ° ° °Return office visit   3 months                  Told patient to return for periodic foot care and evaluation due to potential at risk complications. ° ° °Victorious Cosio DPM  °

## 2019-07-02 NOTE — Telephone Encounter (Signed)
PA has been approved for gabapentin

## 2019-07-07 ENCOUNTER — Encounter: Payer: Self-pay | Admitting: Family Medicine

## 2019-07-07 ENCOUNTER — Other Ambulatory Visit: Payer: Self-pay

## 2019-07-07 ENCOUNTER — Ambulatory Visit (INDEPENDENT_AMBULATORY_CARE_PROVIDER_SITE_OTHER): Payer: Medicare Other | Admitting: Family Medicine

## 2019-07-07 DIAGNOSIS — G8929 Other chronic pain: Secondary | ICD-10-CM

## 2019-07-07 DIAGNOSIS — M533 Sacrococcygeal disorders, not elsewhere classified: Secondary | ICD-10-CM | POA: Diagnosis not present

## 2019-07-07 NOTE — Assessment & Plan Note (Signed)
Repeat injection given today.  Tolerated the procedure well.  Patient seemed to be able to walk better initially.  Hopefully this will make improvement.  Patient continues to walk with the aid of a walker and encouraged him to continue to stay active.  Can repeat injections every 10 weeks.  Differential does include patient having more of a greater trochanteric bursitis and will need to consider the possibility of injection there again and will consider that at follow-up.  This is a chronic problem with exacerbation.  Discussed medication management including gabapentin.

## 2019-07-07 NOTE — Progress Notes (Signed)
Darin [name] Phone: [phone] Subjective:   Darin [name], am serving as a scribe for Dr. Hulan [name]. This visit occurred during the SARS-CoV-2 public health emergency.  Safety protocols were in place, including screening questions prior to the visit, additional usage of staff PPE, and extensive cleaning of exam room while observing appropriate contact time as indicated for disinfecting solutions.   I'm seeing this patient by the request  of:  [name], MD  CC: Low back and left hip pain  [id number]   04/14/2019 Patient continues to have some discomfort and pain and has not responded as well to the injections previously.  We discussed icing regimen and home exercise.  Discussed the possibility of epidurals.  We discussed gabapentin.  Discussed icing regimen and home exercises, topical anti-inflammatories.  Follow-up again in 4 to 8 weeks  Patient is doing relatively well at this point.  Continues to have instability.  Has a custom brace but only wears it intermittently.  Patient will continue to monitor at this time and [name] need for another injection.  Update 07/07/2019 Darin [name] is a 86 y.o. male coming in with complaint of left hip and right knee pain. Patient states that his knee is giving out on him. Brace does not stay on patient's leg.  Patient states [name] pain in the knee.   He is having left hip pain in the glute. Does get relief from injections.  Patient was last given an injection for the sacroiliac joint and the greater trochanteric injection 5 months ago.  Feels similar but not as much pain on the lateral aspect.  Seems to be more pain around the SI joint.  We will get him up at night.  Intermittent radicular symptoms down the leg.     Past Medical History:  Diagnosis Date  . Arthritis   . GERD (gastroesophageal reflux disease)   . H/O hiatal hernia    "FOR YEARS" " [name]"  .  Hyperlipidemia   . Hypertension   . Knee pain   . Neuropathy    PERIPHERAL   . Osteoporosis   . PMR (polymyalgia rheumatica) (HCC)   . Polymyalgia ([place]) 5 YEARS   Past Surgical History:  Procedure Laterality Date  . CARPAL TUNNEL RELEASE    . CATARACT EXTRACTION     right  . COLONOSCOPY  2007  . INGUINAL HERNIA REPAIR  10/14/2011   Procedure: HERNIA REPAIR INGUINAL ADULT;  Surgeon: [name], MD;  Location: [facility];  Service: General;  Laterality: Right;  Right Inguinal Hernia Repair with Mesh  . TONSILLECTOMY    . VARICOSE VEIN SURGERY     Social History   Socioeconomic History  . Marital status: Widowed    Spouse name: Not on file  . Number of children: Not on file  . Years of education: Not on file  . Highest education level: Not on file  Occupational History  . Occupation: retired    Fish farm manager: RETIRED  Tobacco Use  . Smoking status: Never Smoker  . Smokeless tobacco: Never Used  Substance and Sexual Activity  . Alcohol use: Yes    Alcohol/week: 3.0 standard drinks    Types: 3 Glasses of wine per week    Comment: half glass wine before dinner  . Drug use: [name]  . Sexual activity: Yes  Other Topics Concern  . Not on file  Social History Narrative  . Not on file  Social Determinants of Health   Financial Resource Strain:   . Difficulty of Paying Living Expenses:   Food Insecurity:   . Worried About Charity fundraiser in the Last Year:   . Arboriculturist in the Last Year:   Transportation Needs:   . Film/video editor (Medical):   [name]:   Physical Activity:   . Days of Exercise per Week:   . Minutes of Exercise per Session:   Stress:   . Feeling of Stress :   Social Connections:   . Frequency of Communication with Friends and Family:   . Frequency of Social Gatherings with Friends and Family:   . Attends Religious Services:   . Active Member of Clubs or Organizations:   . Attends Archivist  Meetings:   Marland Kitchen Marital Status:    Allergies  Allergen Reactions  . Statins Other (See Comments)    Muscle pain and upset stomach   Family History  Problem Relation Age of Onset  . Heart disease Father   . Coronary artery disease Other      Current Outpatient Medications (Cardiovascular):  .  amLODipine (NORVASC) 5 MG tablet, Take 1 tablet (5 mg total) by mouth daily. .  benazepril-hydrochlorthiazide (LOTENSIN HCT) 10-12.5 MG tablet, TAKE 1 TABLET DAILY   Current Outpatient Medications (Analgesics):  .  aspirin 81 MG tablet, Take 81 mg by mouth daily.    Current Outpatient Medications (Other):  .  famotidine (PEPCID) 20 MG tablet, Take 1 tablet (20 mg total) by mouth at bedtime. .  finasteride (PROSCAR) 5 MG tablet, TAKE 1 TABLET EACH DAY. Marland Kitchen  gabapentin (NEURONTIN) 100 MG capsule, TAKE 1 CAPSULE 4 TIMES     DAILY .  mupirocin ointment (BACTROBAN) 2 %,  .  niacin 500 MG tablet, Take 500 mg by mouth 2 (two) times daily with a meal.  .  polyethylene glycol (MIRALAX / GLYCOLAX) packet, Take 17 g by mouth daily as needed for moderate constipation. .  triamcinolone ointment (KENALOG) 0.1 %, Apply 1 application topically 2 (two) times daily. To affected area .  vitamin C (ASCORBIC ACID) 500 MG tablet, Take 500 mg by mouth daily.   Reviewed prior external information including notes and imaging from  primary care provider As well as notes that were available from care everywhere and other healthcare systems.  Past medical history, social, surgical and family history all reviewed in electronic medical record.  [name] unless stated regarding to the chief complaint.   Review of Systems:  [name] headache, visual changes, nausea, vomiting, diarrhea, constipation, dizziness, abdominal pain, skin rash, fevers, chills, night sweats, weight loss, swollen lymph nodes, , joint swelling, chest pain, shortness of breath, mood changes. POSITIVE muscle aches, body aches  Objective    Blood pressure (!) 116/52, pulse 84, height 6\' 1"  (1.854 m), weight 197 lb (89.4 kg), SpO2 98 %.   General: [name] apparent distress alert and oriented x3 mood and affect normal, dressed appropriately.  HEENT: Pupils equal, extraocular movements intact  Respiratory: Patient's speak in full sentences and does not appear short of breath  Cardiovascular: Trace lower extremity edema, mild tender, [name] erythema  Neuro: Cranial nerves II through XII are intact, neurovascularly intact in all extremities with 2+ DTRs and 2+ pulses.  Gait antalgic walking with the aid of a walker MSK: Left sacroiliac joint does have actually swelling compared to the contralateral side.  Tender to palpation in this area.  Unable to do FABER test secondary to tightness and pain.  Negative straight leg test and does have a tightness of the hamstring.  Neurovascularly intact distally.  After verbal consent patient was prepped with alcohol swab and with a 21-gauge 2 inch needle injected into the left sacroiliac joint with a total of 2 cc of 0.5% Marcaine and 1 cc of Kenalog 40 mg/mL.  [name] blood loss.  Band-Aid placed.  Postinjection instructions given    Impression and Recommendations:     This case required medical decision making of moderate complexity. The above documentation has been reviewed and is accurate and complete Lyndal Pulley, DO       Note: This dictation was prepared with Dragon dictation along with smaller phrase technology. Any transcriptional errors that result from this process are unintentional.

## 2019-07-07 NOTE — Patient Instructions (Signed)
Injected L SI joint today See me in 5-6 weeks, if still having pain can inject GT

## 2019-08-12 ENCOUNTER — Telehealth: Payer: Self-pay | Admitting: Family Medicine

## 2019-08-12 NOTE — Telephone Encounter (Signed)
Patient keeps receiving calls to get Covid vaccine.  He received vaccine already [employer]).  1st dose- 04/02/19 2nd dose- 04/23/19

## 2019-08-16 NOTE — Telephone Encounter (Signed)
This has been documented in chart

## 2019-08-19 ENCOUNTER — Ambulatory Visit (INDEPENDENT_AMBULATORY_CARE_PROVIDER_SITE_OTHER): Payer: Medicare Other | Admitting: Family Medicine

## 2019-08-19 ENCOUNTER — Encounter: Payer: Self-pay | Admitting: Family Medicine

## 2019-08-19 ENCOUNTER — Other Ambulatory Visit: Payer: Self-pay

## 2019-08-19 DIAGNOSIS — M7062 Trochanteric bursitis, left hip: Secondary | ICD-10-CM | POA: Diagnosis not present

## 2019-08-19 NOTE — Patient Instructions (Signed)
Injected side of hip today See me again in

## 2019-08-19 NOTE — Progress Notes (Signed)
[place] Phone: [phone] Subjective:   Darin [name], am serving as a scribe for Dr. Hulan [name]. This visit occurred during the SARS-CoV-2 public health emergency.  Safety protocols were in place, including screening questions prior to the visit, additional usage of staff PPE, and extensive cleaning of exam room while observing appropriate contact time as indicated for disinfecting solutions.   I'm seeing this patient by the request  of:  Darin Post, MD  CC: Left hip pain follow-up  [id number]   07/07/2019 Repeat injection given today.  Tolerated the procedure well.  Patient seemed to be able to walk better initially.  Hopefully this will make improvement.  Patient continues to walk with the aid of a walker and encouraged him to continue to stay active.  Can repeat injections every 10 weeks.  Differential does include patient having more of a greater trochanteric bursitis and will need to consider the possibility of injection there again and will consider that at follow-up.  This is a chronic problem with exacerbation.  Discussed medication management including gabapentin.  Update 08/19/2019 Darin [name] is a 86 y.o. male coming in with complaint of left SI joint pain. Patient states that his hip continues to bother him. Last weekend had increase in his pain. Having pain in proximal hamstring as well.  Patient states that it is fairly severe.  Stopping him from some activities from time to time.  Seems to be a little worse with laying down  Right knee pain intermittent.  Nothing severe enough of the knees injections at the moment.  Continues to ambulate with the aid of a walker     Past Medical History:  Diagnosis Date  . Arthritis   . GERD (gastroesophageal reflux disease)   . H/O hiatal hernia    "FOR YEARS" " [name]"  . Hyperlipidemia   . Hypertension   . Knee pain   . Neuropathy    PERIPHERAL   . Osteoporosis   . PMR (polymyalgia rheumatica) (HCC)   . Polymyalgia ([place]) 5 YEARS   Past Surgical History:  Procedure Laterality Date  . CARPAL TUNNEL RELEASE    . CATARACT EXTRACTION     right  . COLONOSCOPY  2007  . INGUINAL HERNIA REPAIR  10/14/2011   Procedure: HERNIA REPAIR INGUINAL ADULT;  Surgeon: [name], MD;  Location: [facility];  Service: General;  Laterality: Right;  Right Inguinal Hernia Repair with Mesh  . TONSILLECTOMY    . VARICOSE VEIN SURGERY     Social History   Socioeconomic History  . Marital status: Widowed    Spouse name: Not on file  . Number of children: Not on file  . Years of education: Not on file  . Highest education level: Not on file  Occupational History  . Occupation: retired    Fish farm manager: RETIRED  Tobacco Use  . Smoking status: Never Smoker  . Smokeless tobacco: Never Used  Substance and Sexual Activity  . Alcohol use: Yes    Alcohol/week: 3.0 standard drinks    Types: 3 Glasses of wine per week    Comment: half glass wine before dinner  . Drug use: [name]  . Sexual activity: Yes  Other Topics Concern  . Not on file  Social History Narrative  . Not on file   Social Determinants of Health   Financial Resource Strain:   . Difficulty of Paying Living Expenses:   Food Insecurity:   .  Worried About Charity fundraiser in the Last Year:   . Arboriculturist in the Last Year:   Transportation Needs:   . Film/video editor (Medical):   [name]:   Physical Activity:   . Days of Exercise per Week:   . Minutes of Exercise per Session:   Stress:   . Feeling of Stress :   Social Connections:   . Frequency of Communication with Friends and Family:   . Frequency of Social Gatherings with Friends and Family:   . Attends Religious Services:   . Active Member of Clubs or Organizations:   . Attends Archivist Meetings:   Marland Kitchen Marital Status:    Allergies  Allergen Reactions  .  Statins Other (See Comments)    Muscle pain and upset stomach   Family History  Problem Relation Age of Onset  . Heart disease Father   . Coronary artery disease Other      Current Outpatient Medications (Cardiovascular):  .  amLODipine (NORVASC) 5 MG tablet, Take 1 tablet (5 mg total) by mouth daily. .  benazepril-hydrochlorthiazide (LOTENSIN HCT) 10-12.5 MG tablet, TAKE 1 TABLET DAILY   Current Outpatient Medications (Analgesics):  .  aspirin 81 MG tablet, Take 81 mg by mouth daily.    Current Outpatient Medications (Other):  .  famotidine (PEPCID) 20 MG tablet, Take 1 tablet (20 mg total) by mouth at bedtime. .  finasteride (PROSCAR) 5 MG tablet, TAKE 1 TABLET EACH DAY. Marland Kitchen  gabapentin (NEURONTIN) 100 MG capsule, TAKE 1 CAPSULE 4 TIMES     DAILY .  mupirocin ointment (BACTROBAN) 2 %,  .  niacin 500 MG tablet, Take 500 mg by mouth 2 (two) times daily with a meal.  .  polyethylene glycol (MIRALAX / GLYCOLAX) packet, Take 17 g by mouth daily as needed for moderate constipation. .  triamcinolone ointment (KENALOG) 0.1 %, Apply 1 application topically 2 (two) times daily. To affected area .  vitamin C (ASCORBIC ACID) 500 MG tablet, Take 500 mg by mouth daily.   Reviewed prior external information including notes and imaging from  primary care provider As well as notes that were available from care everywhere and other healthcare systems.  Past medical history, social, surgical and family history all reviewed in electronic medical record.  [name] unless stated regarding to the chief complaint.   Review of Systems:  [name] headache, visual changes, nausea, vomiting, diarrhea, constipation, dizziness, abdominal pain, skin rash, fevers, chills, night sweats, weight loss, swollen lymph nodes, body aches, joint swelling, chest pain, shortness of breath, mood changes. POSITIVE muscle aches  Objective  Blood pressure (!) 112/56, pulse 84, height 6\' 1"  (1.854 m), weight 195  lb (88.5 kg), SpO2 98 %.   General: [name] apparent distress alert and oriented x3 mood and affect normal, dressed appropriately.  HEENT: Pupils equal, extraocular movements intact  Patient back exam severe loss of lordosis.  Patient does have tender to palpation more over the greater trochanteric area than anywhere else.  Patient does have some tenderness in the piriformis as well as well as the proximal hamstring.  Difficult to do rest of exam secondary to patient having difficulty with balance.  Antalgic gait that is severe ambulating with the aid of a walker.   After verbal consent patient was prepped with alcohol swab and with a 22-gauge 3 inch needle injected into the left greater trochanteric area with a total of 2 cc of 0.5%  Marcaine and 1 cc of Kenalog 40 mg/mL [name] blood loss.  Band-Aid placed.  Postinjection instructions given Impression and Recommendations:     This case required medical decision making of moderate complexity. The above documentation has been reviewed and is accurate and complete Lyndal Pulley, DO       Note: This dictation was prepared with Dragon dictation along with smaller phrase technology. Any transcriptional errors that result from this process are unintentional.

## 2019-08-19 NOTE — Assessment & Plan Note (Signed)
Injection given today.  Patient did have some resolution with this pain previously.  If this does not seem to help I would like to do a piriformis muscle belly injection.  We discussed if that does not help then I do feel that we further work-up of patient's back will be necessary.  Patient though may not be a candidate for epidurals and it would be considering different medication changes as well.  We will have to discuss that.  Patient will continue to remain active.  Continue the gabapentin.  Follow-up again in 4 to 8 weeks

## 2019-09-06 ENCOUNTER — Other Ambulatory Visit: Payer: Self-pay | Admitting: Family Medicine

## 2019-10-01 ENCOUNTER — Encounter: Payer: Self-pay | Admitting: Podiatry

## 2019-10-01 ENCOUNTER — Other Ambulatory Visit: Payer: Self-pay

## 2019-10-01 ENCOUNTER — Ambulatory Visit (INDEPENDENT_AMBULATORY_CARE_PROVIDER_SITE_OTHER): Payer: Medicare Other | Admitting: Podiatry

## 2019-10-01 DIAGNOSIS — M79675 Pain in left toe(s): Secondary | ICD-10-CM

## 2019-10-01 DIAGNOSIS — B351 Tinea unguium: Secondary | ICD-10-CM | POA: Diagnosis not present

## 2019-10-01 DIAGNOSIS — G609 Hereditary and idiopathic neuropathy, unspecified: Secondary | ICD-10-CM

## 2019-10-01 DIAGNOSIS — M79674 Pain in right toe(s): Secondary | ICD-10-CM | POA: Diagnosis not present

## 2019-10-01 NOTE — Progress Notes (Signed)
This patient returns to my office for at risk foot care.  This patient requires this care by a professional since this patient will be at risk due to having idiopathic neuropathy.  This patient is unable to cut nails himself since the patient cannot reach his nails.These nails are painful walking and wearing shoes.  This patient presents for at risk foot care today. ° °General Appearance  Alert, conversant and in no acute stress. ° °Vascular  Dorsalis pedis and posterior tibial  pulses are weakly  palpable  bilaterally.  Capillary return is within normal limits  bilaterally. Temperature is within normal limits  bilaterally. ° °Neurologic  Senn-Weinstein monofilament wire test within normal limits  bilaterally. Muscle power within normal limits bilaterally. ° °Nails Thick disfigured discolored nails with subungual debris  from hallux to fifth toes bilaterally. Pincer nails  B/L. No evidence of bacterial infection or drainage bilaterally. ° °Orthopedic  No limitations of motion  feet .  No crepitus or effusions noted.  No bony pathology or digital deformities noted. ° °Skin  normotropic skin with no porokeratosis noted bilaterally.  No signs of infections or ulcers noted.    ° °Onychomycosis  Pain in right toes  Pain in left toes ° °Consent was obtained for treatment procedures.   Mechanical debridement of nails 1-5  bilaterally performed with a nail nipper.  Filed with dremel without incident.  ° ° °Return office visit   3 months                  Told patient to return for periodic foot care and evaluation due to potential at risk complications. ° ° °Latonyia Lopata DPM  °

## 2019-10-06 ENCOUNTER — Ambulatory Visit: Payer: Self-pay

## 2019-10-06 ENCOUNTER — Ambulatory Visit (INDEPENDENT_AMBULATORY_CARE_PROVIDER_SITE_OTHER): Payer: Medicare Other | Admitting: Family Medicine

## 2019-10-06 ENCOUNTER — Other Ambulatory Visit: Payer: Self-pay

## 2019-10-06 ENCOUNTER — Encounter: Payer: Self-pay | Admitting: Family Medicine

## 2019-10-06 VITALS — BP 110/60 | HR 79 | Ht 73.0 in | Wt 197.0 lb

## 2019-10-06 DIAGNOSIS — M25552 Pain in left hip: Secondary | ICD-10-CM

## 2019-10-06 DIAGNOSIS — S76312D Strain of muscle, fascia and tendon of the posterior muscle group at thigh level, left thigh, subsequent encounter: Secondary | ICD-10-CM

## 2019-10-06 NOTE — Patient Instructions (Signed)
See me again in 6-8 weeks ?

## 2019-10-06 NOTE — Progress Notes (Signed)
[facility] [address] [place] Phone: [phone] Subjective:   I Darin [name] am serving as a Education administrator for Dr. Hulan [name].  This visit occurred during the SARS-CoV-2 public health emergency.  Safety protocols were in place, including screening questions prior to the visit, additional usage of staff PPE, and extensive cleaning of exam room while observing appropriate contact time as indicated for disinfecting solutions.   I'm seeing this patient by the request  of:  [name], MD  CC: Left hip pain follow-up  [id number]   08/19/2019 Injection given today.  Patient did have some resolution with this pain previously.  If this does not seem to help I would like to do a piriformis muscle belly injection.  We discussed if that does not help then I do feel that we further work-up of patient's back will be necessary.  Patient though may not be a candidate for epidurals and it would be considering different medication changes as well.  We will have to discuss that.  Patient will continue to remain active.  Continue the gabapentin.  Follow-up again in 4 to 8 weeks  Update 10/06/2019 Darin [name] is a 86 y.o. male coming in with complaint of left hip pain. Patient states that his knee is doing well. His hip is giving him some trouble. States the last injection gave him a lot of relief.  Patient was found to have more of a greater trochanteric bursitis and responded well to back injection but was having signs and symptoms consistent with more of a piriformis still.     Past Medical History:  Diagnosis Date  . Arthritis   . GERD (gastroesophageal reflux disease)   . H/O hiatal hernia    "FOR YEARS" " NO PROBLEM"  . Hyperlipidemia   . Hypertension   . Knee pain   . Neuropathy    PERIPHERAL   . Osteoporosis   . PMR (polymyalgia rheumatica) (HCC)   . Polymyalgia ([place]) 5 YEARS   Past Surgical History:  Procedure Laterality Date    . CARPAL TUNNEL RELEASE    . CATARACT EXTRACTION     right  . COLONOSCOPY  2007  . INGUINAL HERNIA REPAIR  10/14/2011   Procedure: HERNIA REPAIR INGUINAL ADULT;  Surgeon: [name], MD;  Location: [facility];  Service: General;  Laterality: Right;  Right Inguinal Hernia Repair with Mesh  . TONSILLECTOMY    . VARICOSE VEIN SURGERY     Social History   Socioeconomic History  . Marital status: Widowed    Spouse name: Not on file  . Number of children: Not on file  . Years of education: Not on file  . Highest education level: Not on file  Occupational History  . Occupation: retired    Fish farm manager: RETIRED  Tobacco Use  . Smoking status: Never Smoker  . Smokeless tobacco: Never Used  Vaping Use  . Vaping Use: Never used  Substance and Sexual Activity  . Alcohol use: Yes    Alcohol/week: 3.0 standard drinks    Types: 3 Glasses of wine per week    Comment: half glass wine before dinner  . Drug use: No  . Sexual activity: Yes  Other Topics Concern  . Not on file  Social History Narrative  . Not on file   Social Determinants of Health   Financial Resource Strain:   . Difficulty of Paying Living Expenses:   Food Insecurity:   . Worried About  Running Out of Food in the Last Year:   . [place] in the Last Year:   Transportation Needs:   . Lack of Transportation (Medical):   [name]:   Physical Activity:   . Days of Exercise per Week:   . Minutes of Exercise per Session:   Stress:   . Feeling of Stress :   Social Connections:   . Frequency of Communication with Friends and Family:   . Frequency of Social Gatherings with Friends and Family:   . Attends Religious Services:   . Active Member of Clubs or Organizations:   . Attends Archivist Meetings:   Marland Kitchen Marital Status:    Allergies  Allergen Reactions  . Statins Other (See Comments)    Muscle pain and upset stomach   Family History  Problem Relation Age of Onset  .  Heart disease Father   . Coronary artery disease Other      Current Outpatient Medications (Cardiovascular):  .  amLODipine (NORVASC) 5 MG tablet, Take 1 tablet (5 mg total) by mouth daily. .  benazepril-hydrochlorthiazide (LOTENSIN HCT) 10-12.5 MG tablet, TAKE 1 TABLET DAILY   Current Outpatient Medications (Analgesics):  .  aspirin 81 MG tablet, Take 81 mg by mouth daily.    Current Outpatient Medications (Other):  .  famotidine (PEPCID) 20 MG tablet, Take 1 tablet (20 mg total) by mouth at bedtime. .  finasteride (PROSCAR) 5 MG tablet, TAKE 1 TABLET EACH DAY. Marland Kitchen  gabapentin (NEURONTIN) 100 MG capsule, TAKE 1 CAPSULE 4 TIMES     DAILY .  mupirocin ointment (BACTROBAN) 2 %,  .  niacin 500 MG tablet, Take 500 mg by mouth 2 (two) times daily with a meal.  .  polyethylene glycol (MIRALAX / GLYCOLAX) packet, Take 17 g by mouth daily as needed for moderate constipation. .  triamcinolone ointment (KENALOG) 0.1 %, Apply 1 application topically 2 (two) times daily. To affected area .  vitamin C (ASCORBIC ACID) 500 MG tablet, Take 500 mg by mouth daily.   Reviewed prior external information including notes and imaging from  primary care provider As well as notes that were available from care everywhere and other healthcare systems.  Past medical history, social, surgical and family history all reviewed in electronic medical record.  No pertanent information unless stated regarding to the chief complaint.   Review of Systems:  No headache, visual changes, nausea, vomiting, diarrhea, constipation, dizziness, abdominal pain, skin rash, fevers, chills, night sweats, weight loss, swollen lymph nodes, body aches, joint swelling, chest pain, shortness of breath, mood changes. POSITIVE muscle aches  Objective  Blood pressure 110/60, pulse 79, height 6\' 1"  (1.854 m), weight 197 lb (89.4 kg), SpO2 97 %.   General: No apparent distress alert and oriented x3 mood and affect normal, dressed  appropriately.  HEENT: Pupils equal, extraocular movements intact  Neuro: Cranial nerves II through XII are intact, neurovascularly intact in all extremities with 2+ DTRs and 2+ pulses.  Gait severely antalgic using the aid of a walker with a shuffling gait MSK: Significant arthritic changes of multiple joints. Left hip exam shows the patient does have tenderness to palpation more in the piriformis than anywhere else today.  Difficult to do range of motion exercises with patient's arthritic changes noted.  Neurovascularly intact distally though.  Procedure: Real-time Ultrasound Guided Injection of left piriformis tendon sheath  Device: GE Logiq Q7 Ultrasound guided injection is preferred based studies that show increased  duration, increased effect, greater accuracy, decreased procedural pain, increased response rate, and decreased cost with ultrasound guided versus blind injection.  Verbal informed consent obtained.  Time-out conducted.  Noted no overlying erythema, induration, or other signs of local infection.  Skin prepped in a sterile fashion.  Local anesthesia: Topical Ethyl chloride.  With sterile technique and under real time ultrasound guidance: With a 21-gauge 2 inch needle injected into the left piriformis tendon sheath with a total of 1 cc of 0.5% Marcaine and 1 cc of Kenalog 40 mg/mL Completed without difficulty  Pain immediately resolved suggesting accurate placement of the medication.  Advised to call if fevers/chills, erythema, induration, drainage, or persistent bleeding.  Images permanently stored and available for review in the ultrasound unit.  Impression: Technically successful ultrasound guided injection.    Impression and Recommendations:     The above documentation has been reviewed and is accurate and complete Darin Pulley, DO       Note: This dictation was prepared with Dragon dictation along with smaller phrase technology. Any transcriptional errors that  result from this process are unintentional.

## 2019-10-06 NOTE — Assessment & Plan Note (Addendum)
Repeat injection given today, tolerated the procedure well, discussed icing regimen and home exercise, which activities to do started what happens when I try to examine his lumbar patient does have many different comorbidities that is contributing and are social determinants of health and keeps [place] from different treatment options.  Patient would not be a candidate for any type of surgical intervention with the underlying arthritic changes.  If this does not help I would really encourage patient to consider the possibility of the epidurals in the back again.  Follow-up again in 6 to 8 weeks:

## 2019-10-20 ENCOUNTER — Other Ambulatory Visit: Payer: Self-pay | Admitting: Family Medicine

## 2019-11-10 ENCOUNTER — Ambulatory Visit (INDEPENDENT_AMBULATORY_CARE_PROVIDER_SITE_OTHER): Payer: Medicare Other | Admitting: Family Medicine

## 2019-11-10 ENCOUNTER — Other Ambulatory Visit: Payer: Self-pay

## 2019-11-10 ENCOUNTER — Encounter: Payer: Self-pay | Admitting: Family Medicine

## 2019-11-10 VITALS — BP 138/64 | HR 95 | Temp 97.9°F | Wt 194.9 lb

## 2019-11-10 DIAGNOSIS — M26621 Arthralgia of right temporomandibular joint: Secondary | ICD-10-CM

## 2019-11-10 NOTE — Patient Instructions (Signed)
Temporomandibular Joint Syndrome  Temporomandibular joint syndrome (TMJ syndrome) is a condition that causes pain in the temporomandibular joints. These joints are located near your ears and allow your jaw to open and close. For people with TMJ syndrome, chewing, biting, or other movements of the jaw can be difficult or painful. TMJ syndrome is often mild and goes away within a few weeks. However, sometimes the condition becomes a long-term (chronic) problem. What are the causes? This condition may be caused by:  Grinding your teeth or clenching your jaw. Some people do this when they are under stress.  Arthritis.  Injury to the jaw.  Head or neck injury.  Teeth or dentures that are not aligned well. In some cases, the cause of TMJ syndrome may not be known. What are the signs or symptoms? The most common symptom of this condition is an aching pain on the side of the head in the area of the TMJ. Other symptoms may include:  Pain when moving your jaw, such as when chewing or biting.  Being unable to open your jaw all the way.  Making a clicking sound when you open your mouth.  Headache.  Earache.  Neck or shoulder pain. How is this diagnosed? This condition may be diagnosed based on:  Your symptoms and medical history.  A physical exam. Your health care provider may check the range of motion of your jaw.  Imaging tests, such as X-rays or an MRI. You may also need to see your dentist, who will determine if your teeth and jaw are lined up correctly. How is this treated? TMJ syndrome often goes away on its own. If treatment is needed, the options may include:  Eating soft foods and applying ice or heat.  Medicines to relieve pain or inflammation.  Medicines or massage to relax the muscles.  A splint, bite plate, or mouthpiece to prevent teeth grinding or jaw clenching.  Relaxation techniques or counseling to help reduce stress.  A therapy for pain in which an  electrical current is applied to the nerves through the skin (transcutaneous electrical nerve stimulation).  Acupuncture. This is sometimes helpful to relieve pain.  Jaw surgery. This is rarely needed. Follow these instructions at home:  Eating and drinking  Eat a soft diet if you are having trouble chewing.  Avoid foods that require a lot of chewing. Do not chew gum. General instructions  Take over-the-counter and prescription medicines only as told by your health care provider.  If directed, put ice on the painful area. ? Put ice in a plastic bag. ? Place a towel between your skin and the bag. ? Leave the ice on for 20 minutes, 2-3 times a day.  Apply a warm, wet cloth (warm compress) to the painful area as directed.  Massage your jaw area and do any jaw stretching exercises as told by your health care provider.  If you were given a splint, bite plate, or mouthpiece, wear it as told by your health care provider.  Keep all follow-up visits as told by your health care provider. This is important. Contact a health care provider if:  You are having trouble eating.  You have new or worsening symptoms. Get help right away if:  Your jaw locks open or closed. Summary  Temporomandibular joint syndrome (TMJ syndrome) is a condition that causes pain in the temporomandibular joints. These joints are located near your ears and allow your jaw to open and close.  TMJ syndrome is often mild and   goes away within a few weeks. However, sometimes the condition becomes a long-term (chronic) problem.  Symptoms include an aching pain on the side of the head in the area of the TMJ, pain when chewing or biting, and being unable to open your jaw all the way. You may also make a clicking sound when you open your mouth.  TMJ syndrome often goes away on its own. If treatment is needed, it may include medicines to relieve pain, reduce inflammation, or relax the muscles. A splint, bite plate, or  mouthpiece may also be used to prevent teeth grinding or jaw clenching. This information is not intended to replace advice given to you by your health care provider. Make sure you discuss any questions you have with your health care provider. Document Revised: 05/23/2017 Document Reviewed: 04/22/2017 Elsevier Patient Education  [place].  Try some topical Diclofenac gel and the above  Give me some follow up in 2-3 weeks if not better.

## 2019-11-10 NOTE — Progress Notes (Signed)
Established Patient Office Visit  Subjective:  Patient ID: Darin [name], male    DOB: 11-03-1931  Age: 86 y.o. MRN: [id number]  CC:  Chief Complaint  Patient presents with  . Ear Pain    right ear pain believes its ear infections radiates down jar     HPI Darin [name] presents for concern for possible "ear infection" right ear.  He states he had abrupt onset during the night of some pain really around the TMJ region.  He has had pain with chewing today.  He has not had any ear drainage.  No sore throat.  No nasal congestion.  No fevers or chills.  Denies any prior history of TMJ issues.  No known bruxism.  He has not take anything for the pain and pain is slightly better today but still present  Past Medical History:  Diagnosis Date  . Arthritis   . GERD (gastroesophageal reflux disease)   . H/O hiatal hernia    "FOR YEARS" " NO PROBLEM"  . Hyperlipidemia   . Hypertension   . Knee pain   . Neuropathy    PERIPHERAL   . Osteoporosis   . PMR (polymyalgia rheumatica) (HCC)   . Polymyalgia ([place]) 5 YEARS    Past Surgical History:  Procedure Laterality Date  . CARPAL TUNNEL RELEASE    . CATARACT EXTRACTION     right  . COLONOSCOPY  2007  . INGUINAL HERNIA REPAIR  10/14/2011   Procedure: HERNIA REPAIR INGUINAL ADULT;  Surgeon: [name], MD;  Location: [facility];  Service: General;  Laterality: Right;  Right Inguinal Hernia Repair with Mesh  . TONSILLECTOMY    . VARICOSE VEIN SURGERY      Family History  Problem Relation Age of Onset  . Heart disease Father   . Coronary artery disease Other     Social History   Socioeconomic History  . Marital status: Widowed    Spouse name: Not on file  . Number of children: Not on file  . Years of education: Not on file  . Highest education level: Not on file  Occupational History  . Occupation: retired    Fish farm manager: RETIRED  Tobacco Use  . Smoking status: Never Smoker  . Smokeless tobacco: Never Used  Vaping Use  .  Vaping Use: Never used  Substance and Sexual Activity  . Alcohol use: Yes    Alcohol/week: 3.0 standard drinks    Types: 3 Glasses of wine per week    Comment: half glass wine before dinner  . Drug use: No  . Sexual activity: Yes  Other Topics Concern  . Not on file  Social History Narrative  . Not on file   Social Determinants of Health   Financial Resource Strain:   . Difficulty of Paying Living Expenses:   Food Insecurity:   . Worried About Charity fundraiser in the Last Year:   . Arboriculturist in the Last Year:   Transportation Needs:   . Film/video editor (Medical):   [name]:   Physical Activity:   . Days of Exercise per Week:   . Minutes of Exercise per Session:   Stress:   . Feeling of Stress :   Social Connections:   . Frequency of Communication with Friends and Family:   . Frequency of Social Gatherings with Friends and Family:   . Attends Religious Services:   . Active Member of Clubs or Organizations:   .  Attends Archivist Meetings:   Marland Kitchen Marital Status:   Intimate Partner Violence:   . Fear of Current or Ex-Partner:   . Emotionally Abused:   Marland Kitchen Physically Abused:   . Sexually Abused:     Outpatient Medications Prior to Visit  Medication Sig Dispense Refill  . amLODipine (NORVASC) 5 MG tablet Take 1 tablet (5 mg total) by mouth daily. 90 tablet 3  . aspirin 81 MG tablet Take 81 mg by mouth daily.     . benazepril-hydrochlorthiazide (LOTENSIN HCT) 10-12.5 MG tablet TAKE 1 TABLET DAILY 90 tablet 1  . famotidine (PEPCID) 20 MG tablet TAKE 1 TABLET AT BEDTIME 90 tablet 1  . finasteride (PROSCAR) 5 MG tablet TAKE 1 TABLET EACH DAY. 90 tablet 0  . gabapentin (NEURONTIN) 100 MG capsule TAKE 1 CAPSULE 4 TIMES     DAILY 360 capsule 6  . mupirocin ointment (BACTROBAN) 2 %     . niacin 500 MG tablet Take 500 mg by mouth 2 (two) times daily with a meal.     . polyethylene glycol (MIRALAX / GLYCOLAX) packet Take 17 g by  mouth daily as needed for moderate constipation.    . triamcinolone ointment (KENALOG) 0.1 % Apply 1 application topically 2 (two) times daily. To affected area 30 g 1  . vitamin C (ASCORBIC ACID) 500 MG tablet Take 500 mg by mouth daily.     No facility-administered medications prior to visit.    Allergies  Allergen Reactions  . Statins Other (See Comments)    Muscle pain and upset stomach    ROS Review of Systems  Constitutional: Negative for chills and fever.  HENT: Negative for sore throat and trouble swallowing.   Respiratory: Negative for cough and shortness of breath.   Skin: Negative for rash.      Objective:    Physical Exam Vitals reviewed.  Constitutional:      Appearance: Normal appearance.  HENT:     Head:     Comments: He has tenderness palpation over the right TMJ joint.    Ears:     Comments: He has some cerumen in the right canal which is cleared with curette without difficulty.  TM appears normal.  No erythema.  No effusion.  Canal appears normal Cardiovascular:     Rate and Rhythm: Normal rate and regular rhythm.  Pulmonary:     Effort: Pulmonary effort is normal.     Breath sounds: Normal breath sounds.  Musculoskeletal:     Cervical back: Neck supple.  Lymphadenopathy:     Cervical: No cervical adenopathy.  Neurological:     Mental Status: He is alert.     BP 138/64 (BP Location: Left Arm, Patient Position: Sitting, Cuff Size: Normal)   Pulse 95   Temp 97.9 F (36.6 C) (Oral)   Wt 194 lb 14.4 oz (88.4 kg)   SpO2 97%   BMI 25.71 kg/m  Wt Readings from Last 3 Encounters:  11/10/19 194 lb 14.4 oz (88.4 kg)  10/06/19 197 lb (89.4 kg)  08/19/19 195 lb (88.5 kg)     Health Maintenance Due  Topic Date Due  . INFLUENZA VACCINE  10/24/2019    There are no preventive care reminders to display for this patient.  Lab Results  Component Value Date   TSH 2.471 03/29/2012   Lab Results  Component Value Date   WBC 6.7 05/02/2017   HGB  11.8 (L) 05/02/2017   HCT 35.8 (L) 05/02/2017  MCV 90.9 05/02/2017   PLT 265 05/02/2017   Lab Results  Component Value Date   NA 137 04/08/2018   K 4.0 04/08/2018   CO2 34 (H) 04/08/2018   GLUCOSE 60 (L) 04/08/2018   BUN 19 04/08/2018   CREATININE 0.77 04/08/2018   BILITOT 0.6 03/12/2017   ALKPHOS 52 03/12/2017   AST 19 03/12/2017   ALT 15 (L) 03/12/2017   PROT 6.1 (L) 03/12/2017   ALBUMIN 3.1 (L) 03/12/2017   CALCIUM 9.7 04/08/2018   ANIONGAP 6 03/16/2017   GFR 102.17 04/08/2018   Lab Results  Component Value Date   CHOL 228 (H) 03/30/2012   Lab Results  Component Value Date   HDL 100 03/30/2012   Lab Results  Component Value Date   LDLCALC 116 (H) 03/30/2012   Lab Results  Component Value Date   TRIG 61 03/30/2012   Lab Results  Component Value Date   CHOLHDL 2.3 03/30/2012   No results found for: HGBA1C    Assessment & Plan:   Right facial pain.  He has tenderness localized over the TMJ joint.  Suspect TMJ syndrome.  No evidence for otitis media  -Recommend conservative measures with avoiding hard to chew foods and stick with mostly soft foods next few days -Consider trial of heat and/or ice for symptom relief -He has some topical diclofenac at home and consider using that 3-4 times daily as needed -If not improving over the next couple weeks consider referral oral surgeon  No orders of the defined types were placed in this encounter.   Follow-up: No follow-ups on file.    [name], MD

## 2019-11-17 ENCOUNTER — Other Ambulatory Visit: Payer: Self-pay

## 2019-11-17 ENCOUNTER — Encounter: Payer: Self-pay | Admitting: Family Medicine

## 2019-11-17 ENCOUNTER — Ambulatory Visit: Payer: Self-pay

## 2019-11-17 ENCOUNTER — Ambulatory Visit (INDEPENDENT_AMBULATORY_CARE_PROVIDER_SITE_OTHER): Payer: Medicare Other | Admitting: Family Medicine

## 2019-11-17 VITALS — BP 120/60 | HR 80 | Ht 73.0 in | Wt 194.0 lb

## 2019-11-17 DIAGNOSIS — M25552 Pain in left hip: Secondary | ICD-10-CM | POA: Diagnosis not present

## 2019-11-17 DIAGNOSIS — M533 Sacrococcygeal disorders, not elsewhere classified: Secondary | ICD-10-CM

## 2019-11-17 DIAGNOSIS — M25559 Pain in unspecified hip: Secondary | ICD-10-CM

## 2019-11-17 DIAGNOSIS — G8929 Other chronic pain: Secondary | ICD-10-CM | POA: Diagnosis not present

## 2019-11-17 NOTE — Progress Notes (Signed)
[facility] [address] [place] Phone: [phone] Subjective:   I Darin [name] am serving as a Education administrator for Dr. Hulan Saas.  This visit occurred during the SARS-CoV-2 public health emergency.  Safety protocols were in place, including screening questions prior to the visit, additional usage of staff PPE, and extensive cleaning of exam room while observing appropriate contact time as indicated for disinfecting solutions.   This visit occurred during the SARS-CoV-2 public health emergency.  Safety protocols were in place, including screening questions prior to the visit, additional usage of staff PPE, and extensive cleaning of exam room while observing appropriate contact time as indicated for disinfecting solutions.  I'm seeing this patient by the request  of:  [name], MD  CC: Left hip pain  [id number]   10/06/2019 Repeat injection given today, tolerated the procedure well, discussed icing regimen and home exercise, which activities to do started what happens when I try to examine his lumbar patient does have many different comorbidities that is contributing and are social determinants of health and keeps [place] from different treatment options.  Patient would not be a candidate for any type of surgical intervention with the underlying arthritic changes.  If this does not help I would really encourage patient to consider the possibility of the epidurals in the back again.  Follow-up again in 6 to 8 weeks:    Update 11/17/2019 Darin [name] is a 86 y.o. male coming in with complaint of left hip pain. Patient states the last injection was not effective. Glut pain.  Patient states that it seems that when we did the sacroiliac joints back in April and seem to give him the best benefit.  Greater trochanteric does not seem to be hurting as much.  Patient now feels that the piriformis 1 did not make an improvement.  Does have back pain but  feels like it is stable.     Past Medical History:  Diagnosis Date  . Arthritis   . GERD (gastroesophageal reflux disease)   . H/O hiatal hernia    "FOR YEARS" " NO PROBLEM"  . Hyperlipidemia   . Hypertension   . Knee pain   . Neuropathy    PERIPHERAL   . Osteoporosis   . PMR (polymyalgia rheumatica) (HCC)   . Polymyalgia ([place]) 5 YEARS   Past Surgical History:  Procedure Laterality Date  . CARPAL TUNNEL RELEASE    . CATARACT EXTRACTION     right  . COLONOSCOPY  2007  . INGUINAL HERNIA REPAIR  10/14/2011   Procedure: HERNIA REPAIR INGUINAL ADULT;  Surgeon: [name], MD;  Location: [facility];  Service: General;  Laterality: Right;  Right Inguinal Hernia Repair with Mesh  . TONSILLECTOMY    . VARICOSE VEIN SURGERY     Social History   Socioeconomic History  . Marital status: Widowed    Spouse name: Not on file  . Number of children: Not on file  . Years of education: Not on file  . Highest education level: Not on file  Occupational History  . Occupation: retired    Fish farm manager: RETIRED  Tobacco Use  . Smoking status: Never Smoker  . Smokeless tobacco: Never Used  Vaping Use  . Vaping Use: Never used  Substance and Sexual Activity  . Alcohol use: Yes    Alcohol/week: 3.0 standard drinks    Types: 3 Glasses of wine per week    Comment: half glass wine before dinner  .  Drug use: No  . Sexual activity: Yes  Other Topics Concern  . Not on file  Social History Narrative  . Not on file   Social Determinants of Health   Financial Resource Strain:   . Difficulty of Paying Living Expenses: Not on file  Food Insecurity:   . Worried About Charity fundraiser in the Last Year: Not on file  . Ran Out of Food in the Last Year: Not on file  Transportation Needs:   . Lack of Transportation (Medical): Not on file  . Lack of Transportation (Non-Medical): Not on file  Physical Activity:   . Days of Exercise per Week: Not on file  . Minutes of Exercise per Session: Not  on file  Stress:   . Feeling of Stress : Not on file  Social Connections:   . Frequency of Communication with Friends and Family: Not on file  . Frequency of Social Gatherings with Friends and Family: Not on file  . Attends Religious Services: Not on file  . Active Member of Clubs or Organizations: Not on file  . Attends Archivist Meetings: Not on file  . Marital Status: Not on file   Allergies  Allergen Reactions  . Statins Other (See Comments)    Muscle pain and upset stomach   Family History  Problem Relation Age of Onset  . Heart disease Father   . Coronary artery disease Other      Current Outpatient Medications (Cardiovascular):  .  amLODipine (NORVASC) 5 MG tablet, Take 1 tablet (5 mg total) by mouth daily. .  benazepril-hydrochlorthiazide (LOTENSIN HCT) 10-12.5 MG tablet, TAKE 1 TABLET DAILY   Current Outpatient Medications (Analgesics):  .  aspirin 81 MG tablet, Take 81 mg by mouth daily.    Current Outpatient Medications (Other):  .  famotidine (PEPCID) 20 MG tablet, TAKE 1 TABLET AT BEDTIME .  finasteride (PROSCAR) 5 MG tablet, TAKE 1 TABLET EACH DAY. Marland Kitchen  gabapentin (NEURONTIN) 100 MG capsule, TAKE 1 CAPSULE 4 TIMES     DAILY .  mupirocin ointment (BACTROBAN) 2 %,  .  niacin 500 MG tablet, Take 500 mg by mouth 2 (two) times daily with a meal.  .  polyethylene glycol (MIRALAX / GLYCOLAX) packet, Take 17 g by mouth daily as needed for moderate constipation. .  triamcinolone ointment (KENALOG) 0.1 %, Apply 1 application topically 2 (two) times daily. To affected area .  vitamin C (ASCORBIC ACID) 500 MG tablet, Take 500 mg by mouth daily.   Reviewed prior external information including notes and imaging from  primary care provider As well as notes that were available from care everywhere and other healthcare systems.  Past medical history, social, surgical and family history all reviewed in electronic medical record.  No pertanent information unless  stated regarding to the chief complaint.   Review of Systems:  No headache, visual changes, nausea, vomiting, diarrhea, constipation, dizziness, abdominal pain, skin rash, fevers, chills, night sweats, weight loss, swollen lymph nodes, body aches, joint swelling, chest pain, shortness of breath, mood changes. POSITIVE muscle aches  Objective  Blood pressure 120/60, pulse 80, height 6\' 1"  (1.854 m), weight 194 lb (88 kg), SpO2 97 %.   General: No apparent distress alert and oriented x3 mood and affect normal, dressed appropriately.  HEENT: Pupils equal, extraocular movements intact  Respiratory: Patient's speak in full sentences and does not appear short of breath  Cardiovascular: No lower extremity edema, non tender, no erythema  Gait antalgic  overall.  Using rolling walker walk and use very short strides Back exam loss of lordosis with some degenerative scoliosis.  Tender to palpation more over the left sacroiliac joint.  Unable to do Corky Sox secondary to discomfort as well as patient's body habitus.  Neurovascularly intact distally.  After verbal consent patient was prepped with alcohol swab and with a 21-gauge 2 inch needle injected with 0.5 cc of 0.5% Marcaine and 1 cc of Kenalog 40 mg/mL.  Minimal blood loss.  Postinjection instructions given Band-Aid placed    Impression and Recommendations:     The above documentation has been reviewed and is accurate and complete Lyndal Pulley, DO       Note: This dictation was prepared with Dragon dictation along with smaller phrase technology. Any transcriptional errors that result from this process are unintentional.

## 2019-11-17 NOTE — Assessment & Plan Note (Signed)
Patient has had some improvement.  We discussed that this could be more though secondary to the lumbar radiculopathy but patient is adamant he would like to avoid any type of epidural again.  We discussed continuing to stay active, home exercise, has done formal physical therapy in the past.  Due to patient's other comorbidities patient is not a candidate for anything more aggressive.  Patient does have gabapentin.  Discussed topical anti-inflammatories follow-up in 2 months

## 2019-11-17 NOTE — Patient Instructions (Addendum)
Good to see you See me again in 2 months 

## 2019-11-22 ENCOUNTER — Telehealth: Payer: Self-pay | Admitting: Family Medicine

## 2019-11-22 NOTE — Progress Notes (Signed)
  Chronic Care Management   Outreach Note  11/22/2019 Name: Darin [name] MRN: [id number] DOB: 1931-11-26  Referred by: [name], MD Reason for referral : No chief complaint on file.   An unsuccessful telephone outreach was attempted today. The patient was referred to the pharmacist for assistance with care management and care coordination.   Follow Up Plan:   Carley Perdue [employer] Scheduler

## 2019-11-24 ENCOUNTER — Other Ambulatory Visit: Payer: Self-pay | Admitting: Family Medicine

## 2019-12-02 NOTE — Telephone Encounter (Signed)
Patient needs this Rx refilled. He had his pharmacy send the request 09/01 and it hasn't been refilled.  finasteride (PROSCAR) 5 MG tablet  [place] Phone:  [phone]  Fax:  [phone]         Please advise

## 2019-12-07 ENCOUNTER — Other Ambulatory Visit: Payer: Self-pay | Admitting: Family Medicine

## 2019-12-08 ENCOUNTER — Telehealth: Payer: Self-pay | Admitting: Family Medicine

## 2019-12-08 NOTE — Progress Notes (Signed)
  Chronic Care Management   Outreach Note  12/08/2019 Name: Darin [name] MRN: [id number] DOB: 1931-04-17  Referred by: [name], MD Reason for referral : No chief complaint on file.   A second unsuccessful telephone outreach was attempted today. The patient was referred to pharmacist for assistance with care management and care coordination.  Follow Up Plan:   Carley Perdue [employer] Scheduler

## 2019-12-08 NOTE — Progress Notes (Signed)
  Chronic Care Management   Note  12/08/2019 Name: Afnan Emberton MRN: [id number] DOB: May 12, 1931  Ajamu Maxon is a 86 y.o. year old male who is a primary care patient of Burchette, Alinda Sierras, MD. I reached out to Theda Belfast by phone today in response to a referral sent by Mr. Lajean Manes PCP, [name], MD.   Mr. Sachs was given information about Chronic Care Management services today including:  1. CCM service includes personalized support from designated clinical staff supervised by his physician, including individualized plan of care and coordination with other care providers 2. 24/7 contact phone numbers for assistance for urgent and routine care needs. 3. Service will only be billed when office clinical staff spend 20 minutes or more in a month to coordinate care. 4. Only one practitioner may furnish and bill the service in a calendar month. 5. The patient may stop CCM services at any time (effective at the end of the month) by phone call to the office staff.   Patient agreed to services and verbal consent obtained.   Follow up plan:   Carley Perdue [employer] Scheduler

## 2020-01-14 ENCOUNTER — Ambulatory Visit (INDEPENDENT_AMBULATORY_CARE_PROVIDER_SITE_OTHER): Payer: Medicare Other | Admitting: Podiatry

## 2020-01-14 ENCOUNTER — Other Ambulatory Visit: Payer: Self-pay

## 2020-01-14 ENCOUNTER — Encounter: Payer: Self-pay | Admitting: Podiatry

## 2020-01-14 DIAGNOSIS — G609 Hereditary and idiopathic neuropathy, unspecified: Secondary | ICD-10-CM

## 2020-01-14 DIAGNOSIS — M79675 Pain in left toe(s): Secondary | ICD-10-CM

## 2020-01-14 DIAGNOSIS — B351 Tinea unguium: Secondary | ICD-10-CM | POA: Diagnosis not present

## 2020-01-14 DIAGNOSIS — M79674 Pain in right toe(s): Secondary | ICD-10-CM

## 2020-01-14 NOTE — Progress Notes (Signed)
This patient returns to my office for at risk foot care.  This patient requires this care by a professional since this patient will be at risk due to having idiopathic neuropathy.  This patient is unable to cut nails himself since the patient cannot reach his nails.These nails are painful walking and wearing shoes.  This patient presents for at risk foot care today.  General Appearance  Alert, conversant and in no acute stress.  Vascular  Dorsalis pedis and posterior tibial  pulses are weakly  palpable  bilaterally.  Capillary return is within normal limits  bilaterally. Temperature is within normal limits  bilaterally.  Neurologic  Senn-Weinstein monofilament wire test within normal limits  bilaterally. Muscle power within normal limits bilaterally.  Nails Thick disfigured discolored nails with subungual debris  from hallux to fifth toes bilaterally. Pincer nails  B/L. No evidence of bacterial infection or drainage bilaterally.  Orthopedic  No limitations of motion  feet .  No crepitus or effusions noted.  No bony pathology or digital deformities noted.  Skin  normotropic skin with no porokeratosis noted bilaterally.  No signs of infections or ulcers noted.     Onychomycosis  Pain in right toes  Pain in left toes  Consent was obtained for treatment procedures.   Mechanical debridement of nails 1-5  bilaterally performed with a nail nipper.  Filed with dremel without incident.    Return office visit   3 months                  Told patient to return for periodic foot care and evaluation due to potential at risk complications.   Gardiner Barefoot DPM

## 2020-01-18 ENCOUNTER — Other Ambulatory Visit: Payer: Self-pay | Admitting: Family Medicine

## 2020-01-18 DIAGNOSIS — I10 Essential (primary) hypertension: Secondary | ICD-10-CM

## 2020-01-19 ENCOUNTER — Other Ambulatory Visit: Payer: Self-pay

## 2020-01-19 ENCOUNTER — Encounter: Payer: Self-pay | Admitting: Family Medicine

## 2020-01-19 ENCOUNTER — Ambulatory Visit (INDEPENDENT_AMBULATORY_CARE_PROVIDER_SITE_OTHER): Payer: Medicare Other | Admitting: Family Medicine

## 2020-01-19 DIAGNOSIS — M1711 Unilateral primary osteoarthritis, right knee: Secondary | ICD-10-CM | POA: Diagnosis not present

## 2020-01-19 DIAGNOSIS — G8929 Other chronic pain: Secondary | ICD-10-CM | POA: Diagnosis not present

## 2020-01-19 DIAGNOSIS — M533 Sacrococcygeal disorders, not elsewhere classified: Secondary | ICD-10-CM

## 2020-01-19 NOTE — Patient Instructions (Addendum)
Good to see you Always a pleasure See me again in 2-3 months

## 2020-01-19 NOTE — Assessment & Plan Note (Addendum)
Repeat injection given today, discussed icing regimen and home exercise, patient is to increase activity slowly.  Patient discussed the risks and benefits of doing these injections repetitively.  Due to patient's other comorbidities he does feel that this is probably the safest aspect.  Patient does get the most relief of this compared to any other medications.  Multiple other medications could make patient a fall risk.  Follow-up again in 2 to 3 months trial of topical anti-inflammatories also given with patient having to avoid oral secondary to GI bleed.

## 2020-01-19 NOTE — Assessment & Plan Note (Signed)
Stable at the moment.

## 2020-01-19 NOTE — Progress Notes (Signed)
[facility] [address] [place] Phone: [phone] Subjective:   I [name] am serving as a Education administrator for Dr. Hulan Saas.  This visit occurred during the SARS-CoV-2 public health emergency.  Safety protocols were in place, including screening questions prior to the visit, additional usage of staff PPE, and extensive cleaning of exam room while observing appropriate contact time as indicated for disinfecting solutions.   I'm seeing this patient by the request  of:  [name], MD  CC: Left hip and back pain follow-up  [id number]   11/17/2019 Patient has had some improvement.  We discussed that this could be more though secondary to the lumbar radiculopathy but patient is adamant he would like to avoid any type of epidural again.  We discussed continuing to stay active, home exercise, has done formal physical therapy in the past.  Due to patient's other comorbidities patient is not a candidate for anything more aggressive.  Patient does have gabapentin.  Discussed topical anti-inflammatories follow-up in 2 months  01/19/2020 Rasaan Brotherton is a 86 y.o. male coming in with complaint of hip pain. Patient states the injections helped a lot last visit. Would like an injection today.  Patient feels like that injection was the most beneficial of any of the other ones.  We have done greater trochanteric injections as well as gluteal injections.  Patient states the most relief has been from the sacroiliac injection.  Has not responded as well to the epidurals.  Right knee known arthritic changes but doing fairly well at the moment.  Continuing to take the gabapentin but anytime increasing dose has given him more somnolence.     Past Medical History:  Diagnosis Date  . Arthritis   . GERD (gastroesophageal reflux disease)   . H/O hiatal hernia    "FOR YEARS" " NO PROBLEM"  . Hyperlipidemia   . Hypertension   . Knee pain   .  Neuropathy    PERIPHERAL   . Osteoporosis   . PMR (polymyalgia rheumatica) (HCC)   . Polymyalgia ([place]) 5 YEARS   Past Surgical History:  Procedure Laterality Date  . CARPAL TUNNEL RELEASE    . CATARACT EXTRACTION     right  . COLONOSCOPY  2007  . INGUINAL HERNIA REPAIR  10/14/2011   Procedure: HERNIA REPAIR INGUINAL ADULT;  Surgeon: [name], MD;  Location: [facility];  Service: General;  Laterality: Right;  Right Inguinal Hernia Repair with Mesh  . TONSILLECTOMY    . VARICOSE VEIN SURGERY     Social History   Socioeconomic History  . Marital status: Widowed    Spouse name: Not on file  . Number of children: Not on file  . Years of education: Not on file  . Highest education level: Not on file  Occupational History  . Occupation: retired    Fish farm manager: RETIRED  Tobacco Use  . Smoking status: Never Smoker  . Smokeless tobacco: Never Used  Vaping Use  . Vaping Use: Never used  Substance and Sexual Activity  . Alcohol use: Yes    Alcohol/week: 3.0 standard drinks    Types: 3 Glasses of wine per week    Comment: half glass wine before dinner  . Drug use: No  . Sexual activity: Yes  Other Topics Concern  . Not on file  Social History Narrative  . Not on file   Social Determinants of Health   Financial Resource Strain:   . Difficulty  of Paying Living Expenses: Not on file  Food Insecurity:   . Worried About Charity fundraiser in the Last Year: Not on file  . Ran Out of Food in the Last Year: Not on file  Transportation Needs:   . Lack of Transportation (Medical): Not on file  . Lack of Transportation (Non-Medical): Not on file  Physical Activity:   . Days of Exercise per Week: Not on file  . Minutes of Exercise per Session: Not on file  Stress:   . Feeling of Stress : Not on file  Social Connections:   . Frequency of Communication with Friends and Family: Not on file  . Frequency of Social Gatherings with Friends and Family: Not on file  . Attends Religious  Services: Not on file  . Active Member of Clubs or Organizations: Not on file  . Attends Archivist Meetings: Not on file  . Marital Status: Not on file   Allergies  Allergen Reactions  . Statins Other (See Comments)    Muscle pain and upset stomach   Family History  Problem Relation Age of Onset  . Heart disease Father   . Coronary artery disease Other      Current Outpatient Medications (Cardiovascular):  .  amLODipine (NORVASC) 5 MG tablet, TAKE 1 TABLET DAILY .  benazepril-hydrochlorthiazide (LOTENSIN HCT) 10-12.5 MG tablet, TAKE 1 TABLET DAILY   Current Outpatient Medications (Analgesics):  .  aspirin 81 MG tablet, Take 81 mg by mouth daily.    Current Outpatient Medications (Other):  .  famotidine (PEPCID) 20 MG tablet, TAKE 1 TABLET AT BEDTIME .  finasteride (PROSCAR) 5 MG tablet, TAKE 1 TABLET EACH DAY. Marland Kitchen  gabapentin (NEURONTIN) 100 MG capsule, TAKE 1 CAPSULE 4 TIMES     DAILY .  mupirocin ointment (BACTROBAN) 2 %,  .  niacin 500 MG tablet, Take 500 mg by mouth 2 (two) times daily with a meal.  .  polyethylene glycol (MIRALAX / GLYCOLAX) packet, Take 17 g by mouth daily as needed for moderate constipation. .  triamcinolone ointment (KENALOG) 0.1 %, Apply 1 application topically 2 (two) times daily. To affected area .  vitamin C (ASCORBIC ACID) 500 MG tablet, Take 500 mg by mouth daily.   Reviewed prior external information including notes and imaging from  primary care provider As well as notes that were available from care everywhere and other healthcare systems.  Past medical history, social, surgical and family history all reviewed in electronic medical record.  No pertanent information unless stated regarding to the chief complaint.   Review of Systems:  No headache, visual changes, nausea, vomiting, diarrhea, constipation, dizziness, abdominal pain, skin rash, fevers, chills, night sweats, weight loss, swollen lymph nodes, body aches, joint swelling,  chest pain, shortness of breath, mood changes. POSITIVE muscle aches  Objective  Blood pressure 118/60, pulse 82, height 6\' 1"  (1.854 m), weight 194 lb (88 kg), SpO2 96 %.   General: No apparent distress alert and oriented x3 mood and affect normal, dressed appropriately.  HEENT: Pupils equal, extraocular movements intact  Respiratory: Patient's speak in full sentences and does not appear short of breath  Cardiovascular: No lower extremity edema, non tender, no erythema  Patient continues to walk with the aid of a walker.  Short strides. Low back exam does show the patient has significant tenderness over the left sacroiliac joint.  No significant swelling noted.  Patient does have tightness with straight leg test bilaterally.  No atrophy of the  legs bilaterally but is symmetric.  Neurovascularly intact distally.  After verbal consent patient was prepped with alcohol swab and with a 21-gauge 2 inch needle with patient in the standing position with his walker was injected into the left sacroiliac joint with a total of 0.5 cc of 0.5% Marcaine and 0.5 cc of Kenalog 40 mg/mL no blood loss.  Band-Aid placed.  Postinjection instructions given.     Impression and Recommendations:     The above documentation has been reviewed and is accurate and complete Lyndal Pulley, DO

## 2020-01-26 ENCOUNTER — Telehealth: Payer: Self-pay | Admitting: Pharmacist

## 2020-01-26 ENCOUNTER — Other Ambulatory Visit: Payer: Self-pay | Admitting: Family Medicine

## 2020-01-26 DIAGNOSIS — G63 Polyneuropathy in diseases classified elsewhere: Secondary | ICD-10-CM

## 2020-01-26 DIAGNOSIS — G8929 Other chronic pain: Secondary | ICD-10-CM

## 2020-01-26 DIAGNOSIS — M1711 Unilateral primary osteoarthritis, right knee: Secondary | ICD-10-CM

## 2020-01-26 DIAGNOSIS — E785 Hyperlipidemia, unspecified: Secondary | ICD-10-CM

## 2020-01-26 NOTE — Chronic Care Management (AMB) (Signed)
Chronic Care Management Pharmacy Assistant   Name: Darin [name]  MRN: [id number] DOB: 1931/10/08  Reason for Encounter: Medication Review/Initial Questions for Pharmacist visit on 01/27/2020  Patient Questions: 1. Have you seen any other providers since your last visit? Yes, Lyndal Pulley, DO Sports Medicine and Gardiner Barefoot, DPM Podiatry 2. Any changes in your medications or health? No 3. Any side effects from any medications? No 4. Do you have any symptoms or problems not managed by your medications? No 5. Any concerns about your health right now?  6. Has your provider asked that you check blood pressure, blood sugar, or follow a special diet at home? No 7. Do you get any type of exercise regularly? Yes, he states he does about 30 mins daily and goes to the gym on Mondays and Thursdays. 8. Can you think of a goal you would like to reach for your health? Yes, he says he would like to be about to walk with his walker again. 9. Do you have any problems getting your medications? No 10. Is there anything that you would like to discuss during the appointment? No   Allergies:   Allergies  Allergen Reactions  . Statins Other (See Comments)    Muscle pain and upset stomach    Medications: Outpatient Encounter Medications as of 01/26/2020  Medication Sig  . amLODipine (NORVASC) 5 MG tablet TAKE 1 TABLET DAILY  . aspirin 81 MG tablet Take 81 mg by mouth daily.   . benazepril-hydrochlorthiazide (LOTENSIN HCT) 10-12.5 MG tablet TAKE 1 TABLET DAILY  . famotidine (PEPCID) 20 MG tablet TAKE 1 TABLET AT BEDTIME  . finasteride (PROSCAR) 5 MG tablet TAKE 1 TABLET EACH DAY.  Marland Kitchen gabapentin (NEURONTIN) 100 MG capsule TAKE 1 CAPSULE 4 TIMES     DAILY  . mupirocin ointment (BACTROBAN) 2 %   . niacin 500 MG tablet Take 500 mg by mouth 2 (two) times daily with a meal.   . polyethylene glycol (MIRALAX / GLYCOLAX) packet Take 17 g by mouth daily as needed for moderate constipation.  .  triamcinolone ointment (KENALOG) 0.1 % Apply 1 application topically 2 (two) times daily. To affected area  . vitamin C (ASCORBIC ACID) 500 MG tablet Take 500 mg by mouth daily.   No facility-administered encounter medications on file as of 01/26/2020.    Current Diagnosis: Patient Active Problem List   Diagnosis Date Noted  . Greater trochanteric bursitis of left hip 02/12/2019  . Strain of left piriformis muscle 10/05/2018  . Pain due to onychomycosis of toenails of both feet 09/23/2018  . Chronic left SI joint pain 04/27/2018  . Right wrist pain 02/05/2018  . Degenerative arthritis of right knee 10/23/2017  . Left leg cellulitis 08/12/2017  . Arthritis of right knee 05/20/2017  . Diverticulosis of colon with hemorrhage   . Acute blood loss anemia   . GIB (gastrointestinal bleeding) 03/12/2017  . Leg edema, right 02/20/2015  . BPH (benign prostatic hyperplasia) 12/09/2014  . Chronic radicular low back pain 08/06/2012  . COPD (chronic obstructive pulmonary disease) ([place]) 04/21/2012  . Syncope 03/29/2012  . Hyponatremia 03/29/2012  . Localized osteoarthrosis not specified whether primary or secondary, lower leg 06/20/2010  . VARICOSE VEINS LOWER EXTREMITIES W/INFLAMMATION 02/12/2010  . [place] LOCALIZED OSTEOARTHROSIS INVOLVING HAND 10/16/2009  . DUPUYTREN'S CONTRACTURE, RIGHT 07/04/2009  . DEGENERATIVE DISC DISEASE, LUMBOSACRAL SPINE W/RADICULOPATHY 06/19/2009  . CARCINOMA, BASAL CELL 02/07/2009  . ANEMIA OF CHRONIC DISEASE 02/07/2009  . BURSITIS, RIGHT SHOULDER 10/07/2008  .  CATARACT ASSOCIATED WITH OTHER SYNDROMES 08/01/2008  . DRY SKIN 08/01/2008  . CERUMEN IMPACTION, BILATERAL 03/01/2008  . PMR (polymyalgia rheumatica) ([place]) 01/18/2008  . CRAMP IN LIMB 01/18/2008  . DEPRESSION, SITUATIONAL, ACUTE 12/28/2007  . CONSTIPATION, DRUG INDUCED 12/28/2007  . POLYNEUROPATHY OTHER DISEASES CLASSIFIED ELSW 09/24/2007  . WEIGHT LOSS, ABNORMAL 01/07/2007  . Hyperlipidemia 09/22/2006  .  Essential hypertension 09/22/2006  . GERD (gastroesophageal reflux disease) 09/22/2006  . Osteoarthritis 09/22/2006    Goals Addressed   None     Follow-Up:  Pharmacist Review   Maia Breslow, [place] Assistant [phone]

## 2020-01-27 ENCOUNTER — Other Ambulatory Visit: Payer: Self-pay

## 2020-01-27 ENCOUNTER — Ambulatory Visit: Payer: Medicare Other | Admitting: Pharmacist

## 2020-01-27 DIAGNOSIS — I10 Essential (primary) hypertension: Secondary | ICD-10-CM

## 2020-01-27 DIAGNOSIS — E785 Hyperlipidemia, unspecified: Secondary | ICD-10-CM

## 2020-01-27 NOTE — Chronic Care Management (AMB) (Signed)
Chronic Care Management Pharmacy  Name: Darin [name]  MRN: [id number] DOB: [birth year]  Initial Planning Appointment: completed 01/26/20  Initial Questions: 1. Have you seen any other providers since your last visit? n/a 2. Any changes in your medicines or health? No   Chief Complaint/ HPI  Darin [name],  86 y.o. , male presents for their Initial CCM visit with the clinical pharmacist In office.  PCP : [name], MD  Their chronic conditions include: HTN, HLD, prevention of heart events, neuropathy, BPH, GERD  Office Visits: -11/10/19 [name], MD: Patient presented for ear pain and TMJ tenderness. Patient reports pain with chewing today. Recommended using topical diclofenac gel for a few weeks and consider referral to oral surgeon if no improvement.   -02/05/2019 [name], MD: Patient presented for bilateral cerumen impaction and BP follow up. Recommend 3 month follow up for BMP.  Consult Visit: -01/19/20 Hulan Saas, DO (sports medicine): Patient presented for follow up for left hip and back pain. Steroid injection administered and discussed icing regimen and home exercise. Follow up in 2-3 months.   -01/14/20 Gardiner Barefoot, DPM (podiatry): Patient presented for follow up for toenail debridement. Follow up in 3 months.  -11/17/19 Hulan Saas, DO (sports medicine): Patient presented for follow up for left hip pain. Steroid injection administered. Discussed topical anti-inflammatories and follow up in 2 months.  -10/06/19 Hulan Saas, DO (sports medicine): Patient presented for follow up for left hip pain. Steroid injection administered. Follow up in 6 to 8 weeks.  -10/01/19 Gardiner Barefoot, DPM (podiatry): Patient presented for follow up for toenail debridement. Follow up in 3 months.  -08/19/19 Hulan Saas, DO (sports medicine): Patient presented for follow up for left hip pain. Steroid injection administered. Follow up in 4 to 8  weeks.  Medications: Outpatient Encounter Medications as of 01/27/2020  Medication Sig  . amLODipine (NORVASC) 5 MG tablet TAKE 1 TABLET DAILY  . aspirin 81 MG tablet Take 81 mg by mouth daily.   . benazepril-hydrochlorthiazide (LOTENSIN HCT) 10-12.5 MG tablet TAKE 1 TABLET DAILY  . famotidine (PEPCID) 20 MG tablet TAKE 1 TABLET AT BEDTIME  . finasteride (PROSCAR) 5 MG tablet TAKE 1 TABLET EACH DAY.  Marland Kitchen gabapentin (NEURONTIN) 100 MG capsule TAKE 1 CAPSULE 4 TIMES     DAILY  . magnesium oxide (MAG-OX) 400 MG tablet Take 400 mg by mouth daily.  Marland Kitchen NIACIN PO Take 20 mg by mouth 2 (two) times daily with a meal.   . polyethylene glycol (MIRALAX / GLYCOLAX) packet Take 17 g by mouth daily as needed for moderate constipation.  . vitamin C (ASCORBIC ACID) 500 MG tablet Take 500 mg by mouth daily.  . mupirocin ointment (BACTROBAN) 2 %   . triamcinolone ointment (KENALOG) 0.1 % Apply 1 application topically 2 (two) times daily. To affected area   No facility-administered encounter medications on file as of 01/27/2020.   Patient lives alone with no family nearby and has been widowed for 12 years. He has 3 step children - a daughter in [place], a son in [place] and another son in [place]. He emails his daughter several times a week and recently went to [facility] for his granddaughter's wedding. He has great neighbors.  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 He is Jewish and goes to services regularly.   He reports cooking his own meals mostly and  eats out about once a week. His dinners include making a roast or stew, sometimes makes salad and salad dressing and he orders a pizza every once in a while. For meat he eats a mix of chicken and beef.  He exercises for 1/2 hour each morning and goes to the gym with a personal trainer twice a week and has been doing this for the past year.  He sleeps about  7-8 hours a night and gets up about once or twice a night to go to the bathroom. He denies trouble falling or staying asleep.  He denies any problems with driving currently. He does have a cataract in one eye and had to adjust with monovision.  He denies any problems with his medications at this time.  Current Diagnosis/Assessment:  Goals Addressed            This Visit's Progress   . Pharmacy Care Plan       CARE PLAN ENTRY (see longitudinal plan of care for additional care plan information)  Current Barriers:  . Chronic Disease Management support, education, and care coordination needs related to Hypertension, Hyperlipidemia, GERD, BPH, and prevention of heart events and neuropathy   Hypertension BP Readings from Last 3 Encounters:  01/19/20 118/60  11/17/19 120/60  11/10/19 138/64   . Pharmacist Clinical Goal(s): o Over the next 180 days, patient will work with PharmD and providers to maintain BP goal <140/90 . Current regimen:  . Amlodipine 5 mg 1 tablet daily . Benazepril-HCTZ 10-12.5 mg 1 tablet daily . Interventions: o Discussed DASH eating plan recommendations: . Emphasizes vegetables, fruits, and whole-grains . Includes fat-free or low-fat dairy products, fish, poultry, beans, nuts, and vegetable oils . Limits foods that are high in saturated fat. These foods include fatty meats, full-fat dairy products, and tropical oils such as coconut, palm kernel, and palm oils. . Limits sugar-sweetened beverages and sweets . Limiting sodium intake to < 1500 mg/day o Discussed recommendations for moderate aerobic exercise for 150 minutes/week spread out over 5 days for heart healthy lifestyle . Patient self care activities - Over the next 180 days, patient will: o Check blood pressure weekly, document, and provide at future appointments o Ensure daily salt intake < 2300 mg/day  Hyperlipidemia Lab Results  Component Value Date/Time   LDLCALC 116 (H) 03/30/2012 02:15 AM    LDLDIRECT 148.5 12/12/2009 12:03 PM   . Pharmacist Clinical Goal(s): o Over the next 180 days, patient will work with PharmD and providers to achieve LDL goal < 100 . Current regimen: . Niacin 500 mg 1 tablet BID with meals . Interventions: o Discussed making a follow up appointment with Dr. Elease Hashimoto for his annual exam . Patient self care activities - Over the next 180 days, patient will: o Continue current medication  Neuropathy . Pharmacist Clinical Goal(s): o Over the next 180 days, patient will work with PharmD and providers to manage symptoms of neuropathy. . Current regimen:  o Gabapentin 100 mg 1 capsule four times daily . Patient self care activities - Over the next 180 days, patient will: o Continue current medications  Prevention of heart events . Pharmacist Clinical Goal(s) o Over the next 180 days, patient will work with PharmD and providers to prevent heart events . Current regimen:  o Aspirin 81 mg 1 tablet daily . Interventions: o Discussed monitoring for signs of bleeding such as unexplained and excessive bleeding from a cut or injury, easy or excessive bruising, blood in urine or stools, and  nosebleeds without a known cause . Patient self care activities - Over the next 180 days, patient will: o Continue current medication  BPH Lab Results  Component Value Date   PSA 0.43 06/08/2018   PSA 1.39 12/12/2009   PSA 1.41 12/28/2007   . Pharmacist Clinical Goal(s) o Over the next 180 days, patient will work with PharmD and providers to manage symptoms of enlarged prostate . Current regimen:  o Finasteride 5 mg 1 tablet daily . Patient self care activities - Over the next 180 days, patient will: o Continue current medications  GERD . Pharmacist Clinical Goal(s) o Over the next 180 days, patient will work with PharmD and providers to manage symptoms of heartburn . Current regimen:  o Famotidine 20 mg 1 tablet at bedtime . Interventions: o Discussed  non-pharmacologic management of symptoms such as elevating the head of your bed, avoiding eating 2-3 hours before bed, avoiding triggering foods such as acidic, spicy, or fatty foods, eating smaller meals, and wearing clothes that are loose around the waist  . Patient self care activities - Over the next 180 days, patient will: o Continue current medication  Medication management . Pharmacist Clinical Goal(s): o Over the next 180 days, patient will work with PharmD and providers to achieve optimal medication adherence . Current pharmacy: [facility] . Interventions o Comprehensive medication review performed. o Continue current medication management strategy . Patient self care activities - Over the next 180 days, patient will: o Take medications as prescribed o Report any questions or concerns to PharmD and/or provider(s)  Initial goal documentation       SDOH Interventions     Most Recent Value  SDOH Interventions  Financial Strain Interventions Intervention Not Indicated  Transportation Interventions Intervention Not Indicated      Hypertension  Denies dizziness/lightheadedness. Denies recent falls.  BP goal is:  <140/90  Office blood pressures are  BP Readings from Last 3 Encounters:  01/19/20 118/60  11/17/19 120/60  11/10/19 138/64   Patient checks BP at home never Patient home BP readings are ranging: n/a  Patient has failed these meds in the past: none Patient is currently controlled on the following medications:  . Amlodipine 5 mg 1 tablet daily . Benazepril-HCTZ 10-12.5 mg 1 tablet daily  We discussed diet and exercise extensively -Diet: patient does use some salt while cooking -DASH eating plan recommendations: . Emphasizes vegetables, fruits, and whole-grains . Includes fat-free or low-fat dairy products, fish, poultry, beans, nuts, and vegetable oils . Limits foods that are high in saturated fat. These foods include fatty meats, full-fat dairy  products, and tropical oils such as coconut, palm kernel, and palm oils. . Limits sugar-sweetened beverages and sweets . Limiting sodium intake to < 1500 mg/day -Discussed recommendations for moderate aerobic exercise for 150 minutes/week spread out over 5 days for heart healthy lifestyle  Plan  Continue current medications  BP assessment in 3 months    Hyperlipidemia   LDL goal < 100  Last lipids Lab Results  Component Value Date   CHOL 228 (H) 03/30/2012   HDL 100 03/30/2012   LDLCALC 116 (H) 03/30/2012   LDLDIRECT 148.5 12/12/2009   TRIG 61 03/30/2012   CHOLHDL 2.3 03/30/2012   Hepatic Function Latest Ref Rng & Units 03/12/2017 03/29/2012 03/09/2012  Total Protein 6.5 - 8.1 g/dL 6.1(L) 5.9(L) 7.0  Albumin 3.5 - 5.0 g/dL 3.1(L) 2.9(L) 3.6  AST 15 - 41 U/L '19 22 26  ' ALT 17 - 63 U/L  15(L) 16 21  Alk Phosphatase 38 - 126 U/L 52 44 40  Total Bilirubin 0.3 - 1.2 mg/dL 0.6 0.3 0.7  Bilirubin, Direct 0.0 - 0.3 mg/dL - <0.1 -     The ASCVD Risk score ([place]., et al., 2013) failed to calculate for the following reasons:   The 2013 ASCVD risk score is only valid for ages 27 to 27   Patient has failed these meds in past: none Patient is currently uncontrolled on the following medications:  . Niacin 20 mg 1 tablet BID with meals  We discussed:  diet and exercise extensively  Plan  Continue current medications Recommend repeat lipid panel if continuing with niacin. Will discuss with PCP about the need for this medication.  Prevention of heart events    Patient has failed these meds in past: none Patient is currently controlled on the following medications:  . Aspirin 81 mg 1 tablet daily  We discussed:  Monitoring for signs of bleeding such as unexplained and excessive bleeding from a cut or injury, easy or excessive bruising, blood in urine or stools, and nosebleeds without a known cause  Plan Will discuss with PCP about stopping aspirin.  Continue current  medications   Neuropathy   Patient has failed these meds in past: none Patient is currently controlled on the following medications:  . Gabapentin 100 mg 1 capsule four times daily  Plan  Continue current medications   BPH   PSA  Date Value Ref Range Status  06/08/2018 0.43 0.10 - 4.00 ng/mL Final    Comment:    Test performed using Access Hybritech PSA Assay, a parmagnetic partical, chemiluminecent immunoassay.  12/12/2009 1.39 0.10 - 4.00 ng/mL Final  12/28/2007 1.41 0.10 - 4.00 ng/mL Final     Patient has failed these meds in past: none Patient is currently controlled on the following medications:  . Finasteride 5 mg 1 tablet daily  We discussed:  Patient reports this has helped him with not going to the bathroom as often  Plan  Continue current medications   GERD   Patient has failed these meds in past: none Patient is currently controlled on the following medications:  . Famotidine 20 mg 1 tablet at bedtime  We discussed:  Non-pharmacologic management of symptoms such as elevating the head of your bed, avoiding eating 2-3 hours before bed, avoiding triggering foods such as acidic, spicy, or fatty foods, eating smaller meals, and wearing clothes that are loose around the waist  Plan  Continue current medications  Miscellaneous   Patient is currently on the following medications:  Marland Kitchen Miralax packet twice daily -  . Triamcinolone ointment 0.1% . Vitamin C 500 mg 1 tablet daily . Magnesium 400 mg daily . Vitamin D 25 mcg . Tart cherry extract 1000 mg   daily  Plan  Continue current medications  Vaccines   Reviewed and discussed patient's vaccination history.    Immunization History  Administered Date(s) Administered  . Hep A / Hep B 06/15/2012, 07/17/2012, 12/18/2012  . Influenza Whole 03/26/2003, 01/12/2007  . Influenza, High Dose Seasonal PF 01/27/2015, 12/21/2015  . Influenza,inj,Quad PF,6+ Mos 03/08/2014  . PFIZER SARS-COV-2 Vaccination  04/02/2019, 04/23/2019  . Pneumococcal Conjugate-13 11/16/2013  . Pneumococcal Polysaccharide-23 12/12/2009  . Td 03/26/2003  . Tdap 06/15/2015   Patient reports he got his flu shot in early October.   Plan  Recommended patient receive shingles vaccine in shingles office/at pharmacy.   Medication Management   [facility] Uses pill box? Yes - 2 weeks Pt endorses 99% compliance   We discussed: Current pharmacy is preferred with insurance plan and patient is satisfied with pharmacy services  Plan  Continue current medication management strategy   Follow up: 6 month phone visit 3 month BP assessment  Jeni Salles, PharmD Clinical Pharmacist [facility] [phone]

## 2020-02-08 NOTE — Patient Instructions (Addendum)
Hi Darin [name],  It was lovely getting to meet you in person! As we had discussed, go ahead and stop taking both the niacin and aspirin every day as they are no longer necessary. I also think it would be helpful to start checking your blood pressure periodically at home, maybe a few times a month to make sure your medications are working.  Please give me a call if you have any questions or need anything before our follow up in 6 months!  Best, Maddie  Jeni Salles, PharmD Clinical Pharmacist [facility]   Visit Information  Goals Addressed            This Visit's Progress   . Pharmacy Care Plan       CARE PLAN ENTRY (see longitudinal plan of care for additional care plan information)  Current Barriers:  . Chronic Disease Management support, education, and care coordination needs related to Hypertension, Hyperlipidemia, GERD, BPH, and prevention of heart events and neuropathy   Hypertension BP Readings from Last 3 Encounters:  01/19/20 118/60  11/17/19 120/60  11/10/19 138/64   . Pharmacist Clinical Goal(s): o Over the next 180 days, patient will work with PharmD and providers to maintain BP goal <140/90 . Current regimen:  . Amlodipine 5 mg 1 tablet daily . Benazepril-HCTZ 10-12.5 mg 1 tablet daily . Interventions: o Discussed DASH eating plan recommendations: . Emphasizes vegetables, fruits, and whole-grains . Includes fat-free or low-fat dairy products, fish, poultry, beans, nuts, and vegetable oils . Limits foods that are high in saturated fat. These foods include fatty meats, full-fat dairy products, and tropical oils such as coconut, palm kernel, and palm oils. . Limits sugar-sweetened beverages and sweets . Limiting sodium intake to < 1500 mg/day o Discussed recommendations for moderate aerobic exercise for 150 minutes/week spread out over 5 days for heart healthy lifestyle . Patient self care activities - Over the next 180 days,  patient will: o Check blood pressure weekly, document, and provide at future appointments o Ensure daily salt intake < 2300 mg/day  Hyperlipidemia Lab Results  Component Value Date/Time   LDLCALC 116 (H) 03/30/2012 02:15 AM   LDLDIRECT 148.5 12/12/2009 12:03 PM   . Pharmacist Clinical Goal(s): o Over the next 180 days, patient will work with PharmD and providers to achieve LDL goal < 100 . Current regimen: . Niacin 500 mg 1 tablet BID with meals . Interventions: o Discussed making a follow up appointment with Dr. Elease Hashimoto for his annual exam . Patient self care activities - Over the next 180 days, patient will: o Continue current medication  Neuropathy . Pharmacist Clinical Goal(s): o Over the next 180 days, patient will work with PharmD and providers to manage symptoms of neuropathy. . Current regimen:  o Gabapentin 100 mg 1 capsule four times daily . Patient self care activities - Over the next 180 days, patient will: o Continue current medications  Prevention of heart events . Pharmacist Clinical Goal(s) o Over the next 180 days, patient will work with PharmD and providers to prevent heart events . Current regimen:  o Aspirin 81 mg 1 tablet daily . Interventions: o Discussed monitoring for signs of bleeding such as unexplained and excessive bleeding from a cut or injury, easy or excessive bruising, blood in urine or stools, and nosebleeds without a known cause . Patient self care activities - Over the next 180 days, patient will: o Continue current medication  BPH Lab Results  Component Value Date   PSA  0.43 06/08/2018   PSA 1.39 12/12/2009   PSA 1.41 12/28/2007   . Pharmacist Clinical Goal(s) o Over the next 180 days, patient will work with PharmD and providers to manage symptoms of enlarged prostate . Current regimen:  o Finasteride 5 mg 1 tablet daily . Patient self care activities - Over the next 180 days, patient will: o Continue current  medications  GERD . Pharmacist Clinical Goal(s) o Over the next 180 days, patient will work with PharmD and providers to manage symptoms of heartburn . Current regimen:  o Famotidine 20 mg 1 tablet at bedtime . Interventions: o Discussed non-pharmacologic management of symptoms such as elevating the head of your bed, avoiding eating 2-3 hours before bed, avoiding triggering foods such as acidic, spicy, or fatty foods, eating smaller meals, and wearing clothes that are loose around the waist  . Patient self care activities - Over the next 180 days, patient will: o Continue current medication  Medication management . Pharmacist Clinical Goal(s): o Over the next 180 days, patient will work with PharmD and providers to achieve optimal medication adherence . Current pharmacy: [facility] . Interventions o Comprehensive medication review performed. o Continue current medication management strategy . Patient self care activities - Over the next 180 days, patient will: o Take medications as prescribed o Report any questions or concerns to PharmD and/or provider(s)  Initial goal documentation        Darin [name] was given information about Chronic Care Management services today including:  1. CCM service includes personalized support from designated clinical staff supervised by his physician, including individualized plan of care and coordination with other care providers 2. 24/7 contact phone numbers for assistance for urgent and routine care needs. 3. Standard insurance, coinsurance, copays and deductibles apply for chronic care management only during months in which we provide at least 20 minutes of these services. Most insurances cover these services at 100%, however patients may be responsible for any copay, coinsurance and/or deductible if applicable. This service may help you avoid the need for more expensive face-to-face services. 4. Only one practitioner may furnish and bill the  service in a calendar month. 5. The patient may stop CCM services at any time (effective at the end of the month) by phone call to the office staff.  Patient agreed to services and verbal consent obtained.   The patient verbalized understanding of instructions, educational materials, and care plan provided today and agreed to receive a mailed copy of patient instructions, educational materials, and care plan.  Telephone follow up appointment with pharmacy team member scheduled for: 6 months    How to Take Your Blood Pressure Blood pressure is a measurement of how strongly your blood is pressing against the walls of your arteries. Arteries are blood vessels that carry blood from your heart throughout your body. Your health care provider takes your blood pressure at each office visit. You can also take your own blood pressure at home with a blood pressure machine. You may need to take your own blood pressure:  To confirm a diagnosis of high blood pressure (hypertension).  To monitor your blood pressure over time.  To make sure your blood pressure medicine is working. Supplies needed: To take your blood pressure, you will need a blood pressure machine. You can buy a blood pressure machine, or blood pressure monitor, at most drugstores or online. There are several types of home blood pressure monitors. When choosing one, consider the following:  Choose a  monitor that has an arm cuff.  Choose a cuff that wraps snugly around your upper arm. You should be able to fit only one finger between your arm and the cuff.  Do not choose a monitor that measures your blood pressure from your wrist or finger. Your health care provider can suggest a reliable monitor that will meet your needs. How to prepare To get the most accurate reading, avoid the following for 30 minutes before you check your blood pressure:  Drinking caffeine.  Drinking alcohol.  Eating.  Smoking.  Exercising. Five minutes  before you check your blood pressure:  Empty your bladder.  Sit quietly without talking in a dining chair, rather than in a soft couch or armchair. How to take your blood pressure To check your blood pressure, follow the instructions in the manual that came with your blood pressure monitor. If you have a digital blood pressure monitor, the instructions may be as follows: 1. Sit up straight. 2. Place your feet on the floor. Do not cross your ankles or legs. 3. Rest your left arm at the level of your heart on a table or desk or on the arm of a chair. 4. Pull up your shirt sleeve. 5. Wrap the blood pressure cuff around the upper part of your left arm, 1 inch (2.5 cm) above your elbow. It is best to wrap the cuff around bare skin. 6. Fit the cuff snugly around your arm. You should be able to place only one finger between the cuff and your arm. 7. Position the cord inside the groove of your elbow. 8. Press the power button. 9. Sit quietly while the cuff inflates and deflates. 10. Read the digital reading on the monitor screen and write it down (record it). 11. Wait 2-3 minutes, then repeat the steps, starting at step 1. What does my blood pressure reading mean? A blood pressure reading consists of a higher number over a lower number. Ideally, your blood pressure should be below 120/80. The first ("top") number is called the systolic pressure. It is a measure of the pressure in your arteries as your heart beats. The second ("bottom") number is called the diastolic pressure. It is a measure of the pressure in your arteries as the heart relaxes. Blood pressure is classified into four stages. The following are the stages for adults who do not have a short-term serious illness or a chronic condition. Systolic pressure and diastolic pressure are measured in a unit called mm Hg. Normal  Systolic pressure: below 903.  Diastolic pressure: below 80. Elevated  Systolic pressure: 009-233.  Diastolic  pressure: below 80. Hypertension stage 1  Systolic pressure: 007-622.  Diastolic pressure: 63-33. Hypertension stage 2  Systolic pressure: 545 or above.  Diastolic pressure: 90 or above. You can have prehypertension or hypertension even if only the systolic or only the diastolic number in your reading is higher than normal. Follow these instructions at home:  Check your blood pressure as often as recommended by your health care provider.  Take your monitor to the next appointment with your health care provider to make sure: ? That you are using it correctly. ? That it provides accurate readings.  Be sure you understand what your goal blood pressure numbers are.  Tell your health care provider if you are having any side effects from blood pressure medicine. Contact a health care provider if:  Your blood pressure is consistently high. Get help right away if:  Your systolic blood pressure is  higher than 180.  Your diastolic blood pressure is higher than 110. This information is not intended to replace advice given to you by your health care provider. Make sure you discuss any questions you have with your health care provider. Document Revised: 02/21/2017 Document Reviewed: 08/18/2015 Elsevier Patient Education  2020 [employer].

## 2020-02-25 ENCOUNTER — Ambulatory Visit (INDEPENDENT_AMBULATORY_CARE_PROVIDER_SITE_OTHER): Payer: Medicare Other | Admitting: Family Medicine

## 2020-02-25 ENCOUNTER — Other Ambulatory Visit: Payer: Self-pay

## 2020-02-25 VITALS — BP 132/50 | HR 78 | Resp 16 | Ht 73.0 in | Wt 201.3 lb

## 2020-02-25 DIAGNOSIS — I10 Essential (primary) hypertension: Secondary | ICD-10-CM

## 2020-02-25 DIAGNOSIS — E785 Hyperlipidemia, unspecified: Secondary | ICD-10-CM | POA: Diagnosis not present

## 2020-02-25 DIAGNOSIS — N400 Enlarged prostate without lower urinary tract symptoms: Secondary | ICD-10-CM | POA: Diagnosis not present

## 2020-02-25 NOTE — Progress Notes (Signed)
Established Patient Office Visit  Subjective:  Patient ID: Darin [name], male    DOB: 28-Mar-1931  Age: 86 y.o. MRN: [id number]  CC:  Chief Complaint  Patient presents with  . Follow-up    pharmacist would like labs done    HPI Darin [name] presents for medical follow-up.  He had recently seen her clinical pharmacist and there have been some discussion regarding getting follow-up labs.  He has history of hyperlipidemia but has been intolerant of multiple statins.  He was taking small dose over-the-counter niacin and was advised to stop that.  Was likely not getting significant clinical benefit from that dose anyway.  He had been taking baby aspirin and was advised to stop that because he is not had any prior history of heart disease or stroke.  He has history of hypertension, GERD, osteoarthritis involving multiple joints, BPH, and hyperlipidemia as above.  His father apparently had MI at age 71 but lived for several more decades.  He has hypertension treated with amlodipine 5 mg daily and benazepril HCTZ 10/12.5 mg 1 daily.  Also takes finasteride for BPH symptoms.  No major obstructive symptoms now.  Has not had any recent electrolytes.  Ambulates with a walker.  No recent falls.  Occasional lightheadedness.  No recent syncope.  Past Medical History:  Diagnosis Date  . Arthritis   . GERD (gastroesophageal reflux disease)   . H/O hiatal hernia    "FOR YEARS" " NO PROBLEM"  . Hyperlipidemia   . Hypertension   . Knee pain   . Neuropathy    PERIPHERAL   . Osteoporosis   . PMR (polymyalgia rheumatica) (HCC)   . Polymyalgia ([facility]) 5 YEARS    Past Surgical History:  Procedure Laterality Date  . CARPAL TUNNEL RELEASE    . CATARACT EXTRACTION     right  . COLONOSCOPY  2007  . INGUINAL HERNIA REPAIR  10/14/2011   Procedure: HERNIA REPAIR INGUINAL ADULT;  Surgeon: [name], Darin;  Location: [facility];  Service: General;  Laterality: Right;  Right Inguinal Hernia Repair with  Mesh  . TONSILLECTOMY    . VARICOSE VEIN SURGERY      Family History  Problem Relation Age of Onset  . Heart disease Father   . Coronary artery disease Other     Social History   Socioeconomic History  . Marital status: Widowed    Spouse name: Not on file  . Number of children: Not on file  . Years of education: Not on file  . Highest education level: Not on file  Occupational History  . Occupation: retired    Fish farm manager: RETIRED  Tobacco Use  . Smoking status: Never Smoker  . Smokeless tobacco: Never Used  Vaping Use  . Vaping Use: Never used  Substance and Sexual Activity  . Alcohol use: Yes    Alcohol/week: 3.0 standard drinks    Types: 3 Glasses of wine per week    Comment: half glass wine before dinner  . Drug use: No  . Sexual activity: Yes  Other Topics Concern  . Not on file  Social History Narrative  . Not on file   Social Determinants of Health   Financial Resource Strain: Low Risk   . Difficulty of Paying Living Expenses: Not hard at all  Food Insecurity:   . Worried About Charity fundraiser in the Last Year: Not on file  . Ran Out of Food in the Last Year: Not on file  Transportation  Needs: No Transportation Needs  . Lack of Transportation (Medical): No  . Lack of Transportation (Non-Medical): No  Physical Activity:   . Days of Exercise per Week: Not on file  . Minutes of Exercise per Session: Not on file  Stress:   . Feeling of Stress : Not on file  Social Connections:   . Frequency of Communication with Friends and Family: Not on file  . Frequency of Social Gatherings with Friends and Family: Not on file  . Attends Religious Services: Not on file  . Active Member of Clubs or Organizations: Not on file  . Attends Archivist Meetings: Not on file  . Marital Status: Not on file  Intimate Partner Violence:   . Fear of Current or Ex-Partner: Not on file  . Emotionally Abused: Not on file  . Physically Abused: Not on file  . Sexually  Abused: Not on file    Outpatient Medications Prior to Visit  Medication Sig Dispense Refill  . amLODipine (NORVASC) 5 MG tablet TAKE 1 TABLET DAILY 90 tablet 3  . benazepril-hydrochlorthiazide (LOTENSIN HCT) 10-12.5 MG tablet TAKE 1 TABLET DAILY 90 tablet 1  . famotidine (PEPCID) 20 MG tablet TAKE 1 TABLET AT BEDTIME 90 tablet 1  . finasteride (PROSCAR) 5 MG tablet TAKE 1 TABLET EACH DAY. 90 tablet 0  . gabapentin (NEURONTIN) 100 MG capsule TAKE 1 CAPSULE 4 TIMES     DAILY 360 capsule 6  . magnesium oxide (MAG-OX) 400 MG tablet Take 400 mg by mouth daily.    . mupirocin ointment (BACTROBAN) 2 %     . polyethylene glycol (MIRALAX / GLYCOLAX) packet Take 17 g by mouth daily as needed for moderate constipation.    . triamcinolone ointment (KENALOG) 0.1 % Apply 1 application topically 2 (two) times daily. To affected area 30 g 1  . vitamin C (ASCORBIC ACID) 500 MG tablet Take 500 mg by mouth daily.    Marland Kitchen aspirin 81 MG tablet Take 81 mg by mouth daily.  (Patient not taking: Reported on 02/25/2020)    . NIACIN PO Take 20 mg by mouth 2 (two) times daily with a meal.  (Patient not taking: Reported on 02/25/2020)     No facility-administered medications prior to visit.    Allergies  Allergen Reactions  . Statins Other (See Comments)    Muscle pain and upset stomach    ROS Review of Systems  Constitutional: Negative for chills, fever and unexpected weight change.  Eyes: Negative for visual disturbance.  Respiratory: Negative for cough, chest tightness and shortness of breath.   Cardiovascular: Negative for chest pain, palpitations and leg swelling.  Endocrine: Negative for polydipsia and polyuria.  Genitourinary: Negative for difficulty urinating and dysuria.  Neurological: Positive for light-headedness. Negative for syncope, weakness and headaches.      Objective:    Physical Exam Vitals reviewed.  Constitutional:      Appearance: Normal appearance.  Cardiovascular:     Rate and  Rhythm: Normal rate and regular rhythm.  Pulmonary:     Effort: Pulmonary effort is normal.     Breath sounds: Normal breath sounds.  Musculoskeletal:     Right lower leg: No edema.     Left lower leg: No edema.  Neurological:     General: No focal deficit present.     Mental Status: He is alert.     BP (!) 132/50   Pulse 78   Resp 16   Ht 6\' 1"  (1.854 m)  Wt 201 lb 4.8 oz (91.3 kg)   SpO2 98%   BMI 26.56 kg/m  Wt Readings from Last 3 Encounters:  02/25/20 201 lb 4.8 oz (91.3 kg)  01/19/20 194 lb (88 kg)  11/17/19 194 lb (88 kg)     There are no preventive care reminders to display for this patient.  There are no preventive care reminders to display for this patient.  Lab Results  Component Value Date   TSH 2.471 03/29/2012   Lab Results  Component Value Date   WBC 6.7 05/02/2017   HGB 11.8 (L) 05/02/2017   HCT 35.8 (L) 05/02/2017   MCV 90.9 05/02/2017   PLT 265 05/02/2017   Lab Results  Component Value Date   NA 137 04/08/2018   K 4.0 04/08/2018   CO2 34 (H) 04/08/2018   GLUCOSE 60 (L) 04/08/2018   BUN 19 04/08/2018   CREATININE 0.77 04/08/2018   BILITOT 0.6 03/12/2017   ALKPHOS 52 03/12/2017   AST 19 03/12/2017   ALT 15 (L) 03/12/2017   PROT 6.1 (L) 03/12/2017   ALBUMIN 3.1 (L) 03/12/2017   CALCIUM 9.7 04/08/2018   ANIONGAP 6 03/16/2017   GFR 102.17 04/08/2018   Lab Results  Component Value Date   CHOL 228 (H) 03/30/2012   Lab Results  Component Value Date   HDL 100 03/30/2012   Lab Results  Component Value Date   LDLCALC 116 (H) 03/30/2012   Lab Results  Component Value Date   TRIG 61 03/30/2012   Lab Results  Component Value Date   CHOLHDL 2.3 03/30/2012   No results found for: HGBA1C    Assessment & Plan:   #1 hypertension stable  -Continue amlodipine 5 mg daily and Lotensin HCTZ 10/12.5 mg daily.  Recheck basic metabolic panel  #2 history of dyslipidemia.  We advised recent discontinuation of niacin.  We discussed pros  and cons of rechecking lipids.  Given his prior intolerance of statins and age we decided that there is no really clear benefit in checking lipids further at this point and patient agrees  #3 BPH symptomatically stable on finasteride  No orders of the defined types were placed in this encounter.   Follow-up: Return in about 6 months (around 08/25/2020).    [name], Darin

## 2020-02-26 LAB — BASIC METABOLIC PANEL
BUN: 16 mg/dL (ref 7–25)
CO2: 29 mmol/L (ref 20–32)
Calcium: 9.8 mg/dL (ref 8.6–10.3)
Chloride: 100 mmol/L (ref 98–110)
Creat: 0.73 mg/dL (ref 0.70–1.11)
Glucose, Bld: 78 mg/dL (ref 65–99)
Potassium: 4.1 mmol/L (ref 3.5–5.3)
Sodium: 139 mmol/L (ref 135–146)

## 2020-02-28 NOTE — Progress Notes (Signed)
Electrolytes/renal function remain normal.

## 2020-03-03 ENCOUNTER — Telehealth: Payer: Self-pay

## 2020-03-03 NOTE — Telephone Encounter (Signed)
Getting gel approval

## 2020-03-03 NOTE — Telephone Encounter (Signed)
Patient called stating that he is having horrible knee pain, states he has an appointment for the 22nd but wanted to know if he could get in sooner for a knee injection. Patient was inquiring about the gel injection but stated either the cortisone or gel is fine. Let me know if its the Gel so I can run through insurance

## 2020-03-03 NOTE — Telephone Encounter (Signed)
Lets get the gel approved, if worsening pain this weekend would need to go to ER

## 2020-03-03 NOTE — Telephone Encounter (Signed)
Patient states he will take medication if the pain gets too bad. Told him we would call him when we get approval.

## 2020-03-03 NOTE — Telephone Encounter (Signed)
Call patient left VM to call back.

## 2020-03-06 NOTE — Telephone Encounter (Signed)
Ran patient for monovisc awaiting response from insurance

## 2020-03-10 ENCOUNTER — Other Ambulatory Visit: Payer: Self-pay | Admitting: Family Medicine

## 2020-03-15 ENCOUNTER — Encounter: Payer: Self-pay | Admitting: Family Medicine

## 2020-03-15 ENCOUNTER — Other Ambulatory Visit: Payer: Self-pay

## 2020-03-15 ENCOUNTER — Ambulatory Visit (INDEPENDENT_AMBULATORY_CARE_PROVIDER_SITE_OTHER): Payer: Medicare Other | Admitting: Family Medicine

## 2020-03-15 ENCOUNTER — Ambulatory Visit (INDEPENDENT_AMBULATORY_CARE_PROVIDER_SITE_OTHER): Payer: Medicare Other

## 2020-03-15 VITALS — BP 118/62 | HR 89 | Ht 73.0 in | Wt 202.0 lb

## 2020-03-15 DIAGNOSIS — M25531 Pain in right wrist: Secondary | ICD-10-CM

## 2020-03-15 DIAGNOSIS — M1711 Unilateral primary osteoarthritis, right knee: Secondary | ICD-10-CM | POA: Diagnosis not present

## 2020-03-15 DIAGNOSIS — M353 Polymyalgia rheumatica: Secondary | ICD-10-CM | POA: Diagnosis not present

## 2020-03-15 DIAGNOSIS — M7062 Trochanteric bursitis, left hip: Secondary | ICD-10-CM | POA: Diagnosis not present

## 2020-03-15 DIAGNOSIS — M25561 Pain in right knee: Secondary | ICD-10-CM

## 2020-03-15 DIAGNOSIS — M25562 Pain in left knee: Secondary | ICD-10-CM

## 2020-03-15 DIAGNOSIS — M170 Bilateral primary osteoarthritis of knee: Secondary | ICD-10-CM | POA: Diagnosis not present

## 2020-03-15 DIAGNOSIS — G8929 Other chronic pain: Secondary | ICD-10-CM | POA: Diagnosis not present

## 2020-03-15 DIAGNOSIS — M19031 Primary osteoarthritis, right wrist: Secondary | ICD-10-CM | POA: Diagnosis not present

## 2020-03-15 NOTE — Assessment & Plan Note (Signed)
Known history of polymyalgia rheumatica.  We will continue to monitor.  Follow-up again in 4 to 5 weeks

## 2020-03-15 NOTE — Progress Notes (Signed)
[place] Phone: [phone] Subjective:   [name], am serving as a scribe for Dr. Hulan Saas. This visit occurred during the SARS-CoV-2 public health emergency.  Safety protocols were in place, including screening questions prior to the visit, additional usage of staff PPE, and extensive cleaning of exam room while observing appropriate contact time as indicated for disinfecting solutions.   I'm seeing this patient by the request  of:  [name], MD  CC: Knee pain, hip pain, wrist pain follow-up  NTI:[id number]   01/19/2020 Stable at the moment.  Repeat injection given today, discussed icing regimen and home exercise, patient is to increase activity slowly.  Patient discussed the risks and benefits of doing these injections repetitively.  Due to patient's other comorbidities he does feel that this is probably the safest aspect.  Patient does get the most relief of this compared to any other medications.  Multiple other medications could make patient a fall risk.  Follow-up again in 2 to 3 months trial of topical anti-inflammatories also given with patient having to avoid oral secondary to GI bleed.  Update 03/15/2020 Trong Gosling is a 86 y.o. male coming in with complaint of bilateral knee pain. Patient states right knee pain has increased since last visit. Also having left hip pain and right wrist pain.  Patient has severe arthritic changes of the knees noted right greater than left.  Starting to have worsening pain again on the right side.  Patient states that it is fairly severe.  Feels like there is some increase in swelling.  Increasing instability.  Continues to walk for a long time with a walker  Left-sided hip pain.  Has been seen previously and does have sacroiliac arthritis.  Has been given injections with some minimal improvement.  Patient states more on the pain on the side of the hip.   Have had greater trochanteric injections as well.  Last injection in this area was in May.  More pain on the lateral aspect no significant radiation of pain  Having right-sided wrist pain.  Reviewing previous notes.  Patient has had arthritic changes since 2019.  Has not really responded well.  Patient states over the course of the last 2 years some mild worsening of pain.  Patient has been given a brace and has worn it occasionally.     Past Medical History:  Diagnosis Date  . Arthritis   . GERD (gastroesophageal reflux disease)   . H/O hiatal hernia    "FOR YEARS" " NO PROBLEM"  . Hyperlipidemia   . Hypertension   . Knee pain   . Neuropathy    PERIPHERAL   . Osteoporosis   . PMR (polymyalgia rheumatica) (HCC)   . Polymyalgia ([place]) 5 YEARS   Past Surgical History:  Procedure Laterality Date  . CARPAL TUNNEL RELEASE    . CATARACT EXTRACTION     right  . COLONOSCOPY  2007  . INGUINAL HERNIA REPAIR  10/14/2011   Procedure: HERNIA REPAIR INGUINAL ADULT;  Surgeon: [name], MD;  Location: [facility];  Service: General;  Laterality: Right;  Right Inguinal Hernia Repair with Mesh  . TONSILLECTOMY    . VARICOSE VEIN SURGERY     Social History   Socioeconomic History  . Marital status: Widowed    Spouse name: Not on file  . Number of children: Not on file  . Years of education: Not on file  .  Highest education level: Not on file  Occupational History  . Occupation: retired    Fish farm manager: RETIRED  Tobacco Use  . Smoking status: Never Smoker  . Smokeless tobacco: Never Used  Vaping Use  . Vaping Use: Never used  Substance and Sexual Activity  . Alcohol use: Yes    Alcohol/week: 3.0 standard drinks    Types: 3 Glasses of wine per week    Comment: half glass wine before dinner  . Drug use: No  . Sexual activity: Yes  Other Topics Concern  . Not on file  Social History Narrative  . Not on file   Social Determinants of Health   Financial Resource Strain: Low Risk    . Difficulty of Paying Living Expenses: Not hard at all  Food Insecurity: Not on file  Transportation Needs: No Transportation Needs  . Lack of Transportation (Medical): No  . Lack of Transportation (Non-Medical): No  Physical Activity: Not on file  Stress: Not on file  Social Connections: Not on file   Allergies  Allergen Reactions  . Statins Other (See Comments)    Muscle pain and upset stomach   Family History  Problem Relation Age of Onset  . Heart disease Father   . Coronary artery disease Other      Current Outpatient Medications (Cardiovascular):  .  amLODipine (NORVASC) 5 MG tablet, TAKE 1 TABLET DAILY .  benazepril-hydrochlorthiazide (LOTENSIN HCT) 10-12.5 MG tablet, TAKE 1 TABLET DAILY     Current Outpatient Medications (Other):  .  famotidine (PEPCID) 20 MG tablet, TAKE 1 TABLET AT BEDTIME .  finasteride (PROSCAR) 5 MG tablet, TAKE 1 TABLET EACH DAY. Marland Kitchen  gabapentin (NEURONTIN) 100 MG capsule, TAKE 1 CAPSULE 4 TIMES     DAILY .  magnesium oxide (MAG-OX) 400 MG tablet, Take 400 mg by mouth daily. .  mupirocin ointment (BACTROBAN) 2 %,  .  polyethylene glycol (MIRALAX / GLYCOLAX) packet, Take 17 g by mouth daily as needed for moderate constipation. .  triamcinolone ointment (KENALOG) 0.1 %, Apply 1 application topically 2 (two) times daily. To affected area .  vitamin C (ASCORBIC ACID) 500 MG tablet, Take 500 mg by mouth daily.   Reviewed prior external information including notes and imaging from  primary care provider As well as notes that were available from care everywhere and other healthcare systems.  Past medical history, social, surgical and family history all reviewed in electronic medical record.  No pertanent information unless stated regarding to the chief complaint.   Review of Systems:  No headache, visual changes, nausea, vomiting, diarrhea, constipation, dizziness, abdominal pain, skin rash, fevers, chills, night sweats, weight loss, swollen  lymph nodes, , joint swelling, chest pain, shortness of breath, mood changes. POSITIVE muscle aches, body aches  Objective  Blood pressure 118/62, pulse 89, height 6\' 1"  (1.854 m), weight 202 lb (91.6 kg), SpO2 99 %.   General: No apparent distress alert and oriented x3 mood and affect normal, dressed appropriately.  HEENT: Pupils equal, extraocular movements intact  Respiratory: Patient's speak in full sentences and does not appear short of breath  Cardiovascular: Trace lower extremity edema, non tender, no erythema   Gait severely antalgic patient walks with the aid of a walker. MSK:   Knee: Right valgus deformity noted.  Abnormal thigh to calf ratio.  Tender to palpation over medial and PF joint line.  ROM decreased lacking the last 15 degrees of flexion and lacks 5 degrees of extension instability with valgus force.  painful  patellar compression. Patellar glide with severe crepitus. Patellar and quadriceps tendons unremarkable. Hamstring and quadriceps strength is normal. Contralateral knee shows arthritic changes with instability but nontender  Left hip exam shows more tenderness over the greater trochanteric area on the left side.  Unable to do [place] secondary to tightness of other musculature.  He does still have pain over the left sacroiliac joint as well.  Negative straight leg test.  Tightness of the piriformis  Right hand exam shows the patient does have arthritic changes noted.  Positive grind of the [place] joint.  Patient does have pain over the TFCC.  Does appear to have some instability noted of the scaphoid lunate area.  Limited range of motion especially lacking the last 10 degrees of extension.  After informed written and verbal consent, patient was seated on exam table. Right knee was prepped with alcohol swab and utilizing anterolateral approach, patient's right knee space was injected with 48 mg per 3 mL of Monovisc (sodium hyaluronate) in a prefilled syringe was injected  easily into the knee through a 22-gauge needle..Patient tolerated the procedure well without immediate complications.  After verbal consent patient was prepped with alcohol swab and with a 21-gauge 2 inch needle patient was injected into the left greater trochanteric area with a total of 1 cc of 0.5% Marcaine and 1 cc of Kenalog 40 mg/mL.  No blood loss.  Band-Aid placed.  Postinjection instructions given     Impression and Recommendations:     The above documentation has been reviewed and is accurate and complete Lyndal Pulley, DO

## 2020-03-15 NOTE — Assessment & Plan Note (Signed)
Patient given viscosupplementation today.  Hopefully will make some improvement.  We discussed icing regimen and home exercises.  Discussed which activities to do which wants to avoid.  Patient knows that this may take up to a month that shows significant benefit.  Patient will increase activity slowly.  Patient will continue to ambulate with the aid of a walker.  Patient will follow up with me again 4 to 5 weeks for his other chronic comorbidities.

## 2020-03-15 NOTE — Assessment & Plan Note (Signed)
Patient has had this over the course of 2 years.  We will get x-rays.  Could be potentially CMC arthritic changes or even radiocarpal arthritis.  Patient will go back to the brace at night.  Discussed topical anti-inflammatories.  Worsening pain will consider injection.

## 2020-03-15 NOTE — Patient Instructions (Signed)
See me again in 4-5 weeks Xray today

## 2020-03-15 NOTE — Assessment & Plan Note (Signed)
Repeat injection given today, tolerated the procedure well, discussed icing regimen and home exercise discussed avoiding certain activities.  Topical anti-inflammatories.  Patient will come back in 4 to 5 weeks and then can consider the possibility of sacroiliac injection again if necessary

## 2020-03-18 ENCOUNTER — Other Ambulatory Visit: Payer: Self-pay | Admitting: Family Medicine

## 2020-03-22 DIAGNOSIS — D225 Melanocytic nevi of trunk: Secondary | ICD-10-CM | POA: Diagnosis not present

## 2020-03-22 DIAGNOSIS — L298 Other pruritus: Secondary | ICD-10-CM | POA: Diagnosis not present

## 2020-03-22 DIAGNOSIS — Z85828 Personal history of other malignant neoplasm of skin: Secondary | ICD-10-CM | POA: Diagnosis not present

## 2020-03-22 DIAGNOSIS — D692 Other nonthrombocytopenic purpura: Secondary | ICD-10-CM | POA: Diagnosis not present

## 2020-03-22 DIAGNOSIS — D2261 Melanocytic nevi of right upper limb, including shoulder: Secondary | ICD-10-CM | POA: Diagnosis not present

## 2020-03-22 DIAGNOSIS — L570 Actinic keratosis: Secondary | ICD-10-CM | POA: Diagnosis not present

## 2020-03-22 DIAGNOSIS — D2262 Melanocytic nevi of left upper limb, including shoulder: Secondary | ICD-10-CM | POA: Diagnosis not present

## 2020-04-14 ENCOUNTER — Other Ambulatory Visit: Payer: Self-pay

## 2020-04-14 ENCOUNTER — Ambulatory Visit (INDEPENDENT_AMBULATORY_CARE_PROVIDER_SITE_OTHER): Payer: Medicare Other | Admitting: Podiatry

## 2020-04-14 ENCOUNTER — Encounter: Payer: Self-pay | Admitting: Podiatry

## 2020-04-14 DIAGNOSIS — B351 Tinea unguium: Secondary | ICD-10-CM | POA: Diagnosis not present

## 2020-04-14 DIAGNOSIS — M79674 Pain in right toe(s): Secondary | ICD-10-CM

## 2020-04-14 DIAGNOSIS — G609 Hereditary and idiopathic neuropathy, unspecified: Secondary | ICD-10-CM

## 2020-04-14 DIAGNOSIS — M79675 Pain in left toe(s): Secondary | ICD-10-CM | POA: Diagnosis not present

## 2020-04-14 NOTE — Progress Notes (Signed)
This patient returns to my office for at risk foot care.  This patient requires this care by a professional since this patient will be at risk due to having idiopathic neuropathy.  This patient is unable to cut nails himself since the patient cannot reach his nails.These nails are painful walking and wearing shoes.  This patient presents for at risk foot care today.  General Appearance  Alert, conversant and in no acute stress.  Vascular  Dorsalis pedis and posterior tibial  pulses are weaklabsent   palpable  bilaterally.  Capillary return is within normal limits  Bilaterally.Cold feet.  bilaterally.  Neurologic  Senn-Weinstein monofilament wire test within normal limits  bilaterally. Muscle power within normal limits bilaterally.  Nails Thick disfigured discolored nails with subungual debris  from hallux to fifth toes bilaterally. Pincer nails  B/L. No evidence of bacterial infection or drainage bilaterally.  Orthopedic  No limitations of motion  feet .  No crepitus or effusions noted.  No bony pathology or digital deformities noted.  Skin  normotropic skin with no porokeratosis noted bilaterally.  No signs of infections or ulcers noted.     Onychomycosis  Pain in right toes  Pain in left toes  Consent was obtained for treatment procedures.   Mechanical debridement of nails 1-5  bilaterally performed with a nail nipper.  Filed with dremel without incident.    Return office visit   3 months                  Told patient to return for periodic foot care and evaluation due to potential at risk complications.   Eirik Schueler DPM  

## 2020-04-18 NOTE — Progress Notes (Unsigned)
[place] [address] [place] Phone: [phone] Subjective:   I [name] am serving as a Education administrator for Dr. Hulan Saas.  This visit occurred during the SARS-CoV-2 public health emergency.  Safety protocols were in place, including screening questions prior to the visit, additional usage of staff PPE, and extensive cleaning of exam room while observing appropriate contact time as indicated for disinfecting solutions.   I'm seeing this patient by the request  of:  [name], MD  CC: Knee pain follow-up, back pain follow-up  OIZ:[id number]   03/15/2020 Repeat injection given today, tolerated the procedure well, discussed icing regimen and home exercise discussed avoiding certain activities.  Topical anti-inflammatories.  Patient will come back in 4 to 5 weeks and then can consider the possibility of sacroiliac injection again if necessary  Known history of polymyalgia rheumatica.  We will continue to monitor.  Follow-up again in 4 to 5 weeks  Patient has had this over the course of 2 years.  We will get x-rays.  Could be potentially CMC arthritic changes or even radiocarpal arthritis.  Patient will go back to the brace at night.  Discussed topical anti-inflammatories.  Worsening pain will consider injection.  Patient given viscosupplementation today.  Hopefully will make some improvement.  We discussed icing regimen and home exercises.  Discussed which activities to do which wants to avoid.  Patient knows that this may take up to a month that shows significant benefit.  Patient will increase activity slowly.  Patient will continue to ambulate with the aid of a walker.  Patient will follow up with me again 4 to 5 weeks for his other chronic comorbidities.  Update 04/19/2020 Gerado Nabers is a 87 y.o. male coming in with complaint of right wrist, right knee and left hip pain. Patient states the wrist pain comes and goes. States that  his skin itches all the time in the wrist. States the knee is doing bad. Anterior pain. States he has difficulty walking. States that weight bearing is painful. Flexion and ROM is limited. Left hip is about the same and states he has pain all the time.  Patient states that the pain is still more over the sacroiliac joint at this time patient describes it as a dull, throbbing aching pain.  Patient understands he does have significant arthritic changes of multiple things and he just would like some relief when possible.       Past Medical History:  Diagnosis Date  . Arthritis   . GERD (gastroesophageal reflux disease)   . H/O hiatal hernia    "FOR YEARS" " NO PROBLEM"  . Hyperlipidemia   . Hypertension   . Knee pain   . Neuropathy    PERIPHERAL   . Osteoporosis   . PMR (polymyalgia rheumatica) (HCC)   . Polymyalgia ([place]) 5 YEARS   Past Surgical History:  Procedure Laterality Date  . CARPAL TUNNEL RELEASE    . CATARACT EXTRACTION     right  . COLONOSCOPY  2007  . INGUINAL HERNIA REPAIR  10/14/2011   Procedure: HERNIA REPAIR INGUINAL ADULT;  Surgeon: [name], MD;  Location: [facility];  Service: General;  Laterality: Right;  Right Inguinal Hernia Repair with Mesh  . TONSILLECTOMY    . VARICOSE VEIN SURGERY     Social History   Socioeconomic History  . Marital status: Widowed    Spouse name: Not on file  . Number of children: Not on  file  . Years of education: Not on file  . Highest education level: Not on file  Occupational History  . Occupation: retired    Fish farm manager: RETIRED  Tobacco Use  . Smoking status: Never Smoker  . Smokeless tobacco: Never Used  Vaping Use  . Vaping Use: Never used  Substance and Sexual Activity  . Alcohol use: Yes    Alcohol/week: 3.0 standard drinks    Types: 3 Glasses of wine per week    Comment: half glass wine before dinner  . Drug use: No  . Sexual activity: Yes  Other Topics Concern  . Not on file  Social History Narrative  .  Not on file   Social Determinants of Health   Financial Resource Strain: Low Risk   . Difficulty of Paying Living Expenses: Not hard at all  Food Insecurity: Not on file  Transportation Needs: No Transportation Needs  . Lack of Transportation (Medical): No  . Lack of Transportation (Non-Medical): No  Physical Activity: Not on file  Stress: Not on file  Social Connections: Not on file   Allergies  Allergen Reactions  . Statins Other (See Comments)    Muscle pain and upset stomach   Family History  Problem Relation Age of Onset  . Heart disease Father   . Coronary artery disease Other      Current Outpatient Medications (Cardiovascular):  .  amLODipine (NORVASC) 5 MG tablet, TAKE 1 TABLET DAILY .  benazepril-hydrochlorthiazide (LOTENSIN HCT) 10-12.5 MG tablet, TAKE 1 TABLET DAILY     Current Outpatient Medications (Other):  .  famotidine (PEPCID) 20 MG tablet, TAKE 1 TABLET AT BEDTIME .  finasteride (PROSCAR) 5 MG tablet, TAKE 1 TABLET EACH DAY. Marland Kitchen  gabapentin (NEURONTIN) 100 MG capsule, TAKE 1 CAPSULE 4 TIMES     DAILY .  magnesium oxide (MAG-OX) 400 MG tablet, Take 400 mg by mouth daily. .  mupirocin ointment (BACTROBAN) 2 %,  .  polyethylene glycol (MIRALAX / GLYCOLAX) packet, Take 17 g by mouth daily as needed for moderate constipation. .  triamcinolone ointment (KENALOG) 0.1 %, Apply 1 application topically 2 (two) times daily. To affected area .  vitamin C (ASCORBIC ACID) 500 MG tablet, Take 500 mg by mouth daily.   Reviewed prior external information including notes and imaging from  primary care provider As well as notes that were available from care everywhere and other healthcare systems.  Past medical history, social, surgical and family history all reviewed in electronic medical record.  No pertanent information unless stated regarding to the chief complaint.   Review of Systems:  No headache, visual changes, nausea, vomiting, diarrhea, constipation,  dizziness, abdominal pain, skin rash, fevers, chills, night sweats, weight loss, swollen lymph nodes,joint swelling, chest pain, shortness of breath, mood changes. POSITIVE muscle aches, body aches  Objective  Blood pressure (!) 150/64, pulse 74, height 6\' 1"  (1.854 m), weight 203 lb (92.1 kg), SpO2 100 %.   General: No apparent distress alert and oriented x3 mood and affect normal, dressed appropriately.  HEENT: Pupils equal, extraocular movements intact  Respiratory: Patient's speak in full sentences and does not appear short of breath  Gait antalgic gait noted walking with the aid of a walker with short strides MSK: Right knee exam still has some abnormal thigh to calf ratio.  Instability with valgus and varus force.  Tender to palpation over the medial joint space more than anywhere else.  Lacks the last 15 degrees of flexion in the last 5  degrees of extension.  Neurovascularly intact distally. Back exam has significant loss of lordosis in the some degenerative scoliosis.  Severe tenderness over the left sacroiliac joint.  No masses appreciated.  Tenderness in the paraspinal musculature of the lumbar spine around L1 4 and L5 as well.  After verbal consent patient was prepped with alcohol swab and with a 21-gauge 2 inch needle injected into the left sacroiliac joint with a total of 0.5 cc of 0.5% Marcaine and 0.5 cc of Kenalog 40 mg/mL.  Very minimal blood loss.  Postinjection instructions given after Band-Aid placed.      Filed Weights   04/19/20 0836  Weight: 203 lb (92.1 kg)   Patient's weight is up from 194 pounds in October Impression and Recommendations:     The above documentation has been reviewed and is accurate and complete Lyndal Pulley, DO

## 2020-04-19 ENCOUNTER — Other Ambulatory Visit: Payer: Self-pay

## 2020-04-19 ENCOUNTER — Ambulatory Visit: Payer: Self-pay

## 2020-04-19 ENCOUNTER — Encounter: Payer: Self-pay | Admitting: Family Medicine

## 2020-04-19 ENCOUNTER — Ambulatory Visit (INDEPENDENT_AMBULATORY_CARE_PROVIDER_SITE_OTHER): Payer: Medicare Other

## 2020-04-19 ENCOUNTER — Ambulatory Visit (INDEPENDENT_AMBULATORY_CARE_PROVIDER_SITE_OTHER): Payer: Medicare Other | Admitting: Family Medicine

## 2020-04-19 VITALS — BP 150/64 | HR 74 | Ht 73.0 in | Wt 203.0 lb

## 2020-04-19 DIAGNOSIS — M5126 Other intervertebral disc displacement, lumbar region: Secondary | ICD-10-CM | POA: Diagnosis not present

## 2020-04-19 DIAGNOSIS — G8929 Other chronic pain: Secondary | ICD-10-CM | POA: Diagnosis not present

## 2020-04-19 DIAGNOSIS — M1711 Unilateral primary osteoarthritis, right knee: Secondary | ICD-10-CM | POA: Diagnosis not present

## 2020-04-19 DIAGNOSIS — M533 Sacrococcygeal disorders, not elsewhere classified: Secondary | ICD-10-CM

## 2020-04-19 DIAGNOSIS — M25552 Pain in left hip: Secondary | ICD-10-CM

## 2020-04-19 DIAGNOSIS — M545 Low back pain, unspecified: Secondary | ICD-10-CM | POA: Diagnosis not present

## 2020-04-19 NOTE — Patient Instructions (Addendum)
Good to see you Hydrocortisone daily for the next week for the wrist Aveeno or Eucerin lotion twice a day Injected left SI joint today If not better in 2 weeks I Would consider another epidural Low back and hip xray today See me again in 6 weeks

## 2020-04-19 NOTE — Assessment & Plan Note (Signed)
Repeat injection given today.  Tolerated the procedure well.  Discussed with patient that this has been a chronic problem for quite some time.  We will get repeat x-rays.  Continue to have trouble I do think an MRI is necessary as well.  Patient does not have any fevers chills or any abnormal weight loss.  Just more pain overall.

## 2020-04-19 NOTE — Assessment & Plan Note (Signed)
End-stage arthritis.  Not responding as well to the viscosupplementation.  We will consider going back to the steroid injection.  Continue potential PRP as well.  Patient continues to need custom brace secondary to the size of it.  We tried a different brace today to see if this would be used more of efficiently.  We will continue the walker at this time.  Follow-up again in 6 weeks

## 2020-04-19 NOTE — Assessment & Plan Note (Signed)
Patient has had significant mood difficulty for quite some time.  We have attempted everything at this time.  Patient did not respond well to the epidural in 2020.  Patient did have a sclerotic benign area in the spine and correlating with PSA was normal at that time.  We discussed that we will repeat x-rays today but if continued trouble I would consider a repeat MRI with questionable epidurals again to try to help with pain management.  Concern with increasing any medications such as tramadol secondary to patient's fall risk.  Patient understands the plan and will try her best to help with the pain

## 2020-04-21 ENCOUNTER — Ambulatory Visit: Payer: Medicare Other | Admitting: Podiatry

## 2020-04-30 ENCOUNTER — Other Ambulatory Visit: Payer: Self-pay | Admitting: Family Medicine

## 2020-05-18 ENCOUNTER — Other Ambulatory Visit: Payer: Self-pay | Admitting: Family Medicine

## 2020-05-18 ENCOUNTER — Telehealth: Payer: Self-pay | Admitting: Family Medicine

## 2020-05-18 NOTE — Telephone Encounter (Signed)
Left message for patient to call back and schedule Medicare Annual Wellness Visit (AWV) either virtually or in office. No detailed message left  Last AWV 10/20/17 please schedule at anytime with [facility] Nurse Health Advisor 1 or 2   This should be a 45 minute visit.

## 2020-05-18 NOTE — Telephone Encounter (Signed)
Pt called and has been scheduled

## 2020-05-26 ENCOUNTER — Telehealth: Payer: Self-pay | Admitting: Family Medicine

## 2020-05-26 MED ORDER — FINASTERIDE 5 MG PO TABS: ORAL_TABLET | ORAL | 3 refills | Status: DC

## 2020-05-26 NOTE — Telephone Encounter (Signed)
Patient aware.

## 2020-05-26 NOTE — Telephone Encounter (Signed)
   finasteride (PROSCAR) 5 MG tablet  [place] Phone:  [phone]  Fax:  [phone]

## 2020-05-26 NOTE — Telephone Encounter (Signed)
Left message asking patient to call office  Please reschedule 06/13/20 appointment, Otila Kluver will be out of office at that time

## 2020-06-13 ENCOUNTER — Ambulatory Visit: Payer: Medicare Other

## 2020-06-13 NOTE — Progress Notes (Unsigned)
[place] [address] [place] Phone: [phone] Subjective:   I Darin [name] am serving as a Education administrator for Dr. Hulan Saas.  This visit occurred during the SARS-CoV-2 public health emergency.  Safety protocols were in place, including screening questions prior to the visit, additional usage of staff PPE, and extensive cleaning of exam room while observing appropriate contact time as indicated for disinfecting solutions.   I'm seeing this patient by the request  of:  [name], MD  CC: Right knee and left hip pain.  [id number]   04/09/2020 Patient has had significant mood difficulty for quite some time.  We have attempted everything at this time.  Patient did not respond well to the epidural in 2020.  Patient did have a sclerotic benign area in the spine and correlating with PSA was normal at that time.  We discussed that we will repeat x-rays today but if continued trouble I would consider a repeat MRI with questionable epidurals again to try to help with pain management.  Concern with increasing any medications such as tramadol secondary to patient's fall risk.  Patient understands the plan and will try her best to help with the pain  Repeat injection given today.  Tolerated the procedure well.  Discussed with patient that this has been a chronic problem for quite some time.  We will get repeat x-rays.  Continue to have trouble I do think an MRI is necessary as well.  Patient does not have any fevers chills or any abnormal weight loss.  Just more pain overall.  End-stage arthritis.  Not responding as well to the viscosupplementation.  We will consider going back to the steroid injection.  Continue potential PRP as well.  Patient continues to need custom brace secondary to the size of it.  We tried a different brace today to see if this would be used more of efficiently.  We will continue the walker at this time.  Follow-up again in  6 weeks  Update 06/14/2020 Darin [name] is a 87 y.o. male coming in with complaint of L SI joint pain, and R knee pain. Patient states he is in a lot of pain. States that some times he can barely walk with the pain in the left leg. States the OA reaction knee brace was more painful than useful. Still wearing compression sleeve.      Past Medical History:  Diagnosis Date  . Arthritis   . GERD (gastroesophageal reflux disease)   . H/O hiatal hernia    "FOR YEARS" " NO PROBLEM"  . Hyperlipidemia   . Hypertension   . Knee pain   . Neuropathy    PERIPHERAL   . Osteoporosis   . PMR (polymyalgia rheumatica) (HCC)   . Polymyalgia ([place]) 5 YEARS   Past Surgical History:  Procedure Laterality Date  . CARPAL TUNNEL RELEASE    . CATARACT EXTRACTION     right  . COLONOSCOPY  2007  . INGUINAL HERNIA REPAIR  10/14/2011   Procedure: HERNIA REPAIR INGUINAL ADULT;  Surgeon: [name], MD;  Location: [facility];  Service: General;  Laterality: Right;  Right Inguinal Hernia Repair with Mesh  . TONSILLECTOMY    . VARICOSE VEIN SURGERY     Social History   Socioeconomic History  . Marital status: Widowed    Spouse name: Not on file  . Number of children: Not on file  . Years of education: Not on file  .  Highest education level: Not on file  Occupational History  . Occupation: retired    Fish farm manager: RETIRED  Tobacco Use  . Smoking status: Never Smoker  . Smokeless tobacco: Never Used  Vaping Use  . Vaping Use: Never used  Substance and Sexual Activity  . Alcohol use: Yes    Alcohol/week: 3.0 standard drinks    Types: 3 Glasses of wine per week    Comment: half glass wine before dinner  . Drug use: No  . Sexual activity: Yes  Other Topics Concern  . Not on file  Social History Narrative  . Not on file   Social Determinants of Health   Financial Resource Strain: Low Risk   . Difficulty of Paying Living Expenses: Not hard at all  Food Insecurity: Not on file  Transportation  Needs: No Transportation Needs  . Lack of Transportation (Medical): No  . Lack of Transportation (Non-Medical): No  Physical Activity: Not on file  Stress: Not on file  Social Connections: Not on file   Allergies  Allergen Reactions  . Statins Other (See Comments)    Muscle pain and upset stomach   Family History  Problem Relation Age of Onset  . Heart disease Father   . Coronary artery disease Other      Current Outpatient Medications (Cardiovascular):  .  amLODipine (NORVASC) 5 MG tablet, TAKE 1 TABLET DAILY .  benazepril-hydrochlorthiazide (LOTENSIN HCT) 10-12.5 MG tablet, TAKE 1 TABLET DAILY     Current Outpatient Medications (Other):  .  famotidine (PEPCID) 20 MG tablet, TAKE 1 TABLET AT BEDTIME .  finasteride (PROSCAR) 5 MG tablet, TAKE 1 TABLET EACH DAY. Marland Kitchen  gabapentin (NEURONTIN) 100 MG capsule, TAKE 1 CAPSULE 4 TIMES     DAILY .  magnesium oxide (MAG-OX) 400 MG tablet, Take 400 mg by mouth daily. .  mupirocin ointment (BACTROBAN) 2 %,  .  polyethylene glycol (MIRALAX / GLYCOLAX) packet, Take 17 g by mouth daily as needed for moderate constipation. .  vitamin C (ASCORBIC ACID) 500 MG tablet, Take 500 mg by mouth daily. Marland Kitchen  triamcinolone ointment (KENALOG) 0.1 %, Apply 1 application topically 2 (two) times daily. To affected area   Reviewed prior external information including notes and imaging from  primary care provider As well as notes that were available from care everywhere and other healthcare systems.  Past medical history, social, surgical and family history all reviewed in electronic medical record.  No pertanent information unless stated regarding to the chief complaint.   Review of Systems:  No headache, visual changes, nausea, vomiting, diarrhea, constipation, dizziness, abdominal pain, skin rash, fevers, chills, night sweats, weight loss, swollen lymph nodes,  chest pain, shortness of breath, mood changes. POSITIVE muscle aches, body aches, joint  swelling  Objective  Blood pressure 110/60, pulse 84, height 6\' 1"  (1.854 m), weight 203 lb (92.1 kg), SpO2 100 %.   General: No apparent distress alert and oriented x3 mood and affect normal, dressed appropriately.  HEENT: Pupils equal, extraocular movements intact  Respiratory: Patient's speak in full sentences and does not appear short of breath  Cardiovascular: Trace lower extremity edema, non tender, no erythema  Gait severely antalgic.  Patient walking with the aid of a walker MSK: Patient's right knee does have some instability noted.  Significant crepitus.  Patient lacks the last 20 degrees of flexion.  Difficulty with also extension  After informed written and verbal consent, patient was seated on exam table. Right knee was prepped with alcohol swab  and utilizing anterolateral approach, patient's right knee space was injected with 4:1  marcaine 0.5%: Kenalog 40mg /dL. Patient tolerated the procedure well without immediate complications.  After verbal consent patient was prepped with alcohol swab and with a 21-gauge 2 inch needle injected into the left GT with a total of 2 cc of 0.5% Marcaine and 1 cc of Kenalog 40 mg/mL.  No blood loss.  Band-Aid placed.  Postinjection instructions given   Impression and Recommendations:     The above documentation has been reviewed and is accurate and complete Lyndal Pulley, DO

## 2020-06-14 ENCOUNTER — Ambulatory Visit (INDEPENDENT_AMBULATORY_CARE_PROVIDER_SITE_OTHER): Payer: Medicare Other | Admitting: Family Medicine

## 2020-06-14 ENCOUNTER — Other Ambulatory Visit: Payer: Self-pay

## 2020-06-14 ENCOUNTER — Encounter: Payer: Self-pay | Admitting: Family Medicine

## 2020-06-14 DIAGNOSIS — M1711 Unilateral primary osteoarthritis, right knee: Secondary | ICD-10-CM | POA: Diagnosis not present

## 2020-06-14 DIAGNOSIS — M5126 Other intervertebral disc displacement, lumbar region: Secondary | ICD-10-CM | POA: Diagnosis not present

## 2020-06-14 DIAGNOSIS — M7062 Trochanteric bursitis, left hip: Secondary | ICD-10-CM | POA: Diagnosis not present

## 2020-06-14 MED ORDER — TRIAMCINOLONE ACETONIDE 0.1 % EX OINT: 1.0000 "application " | TOPICAL_OINTMENT | Freq: Two times a day (BID) | CUTANEOUS | 1 refills | Status: DC

## 2020-06-14 NOTE — Assessment & Plan Note (Signed)
I do believe that some of the pain is coming from patient's radicular back pain.  Patient though does not want to undergo another epidural.  We will continue to monitor otherwise at this time.

## 2020-06-14 NOTE — Assessment & Plan Note (Signed)
Patient given injection today.  Tolerated the procedure well.  Patient does have end-stage arthritic changes.  Patient has worsening pain will try the steroid injection again and hope it does give him some benefit.  Patient given a hinged brace to see if this would be better for stability.  Patient will continue to use the walker.  Follow-up with me again 2 months

## 2020-06-14 NOTE — Assessment & Plan Note (Signed)
Repeat injection given again today.  Due to patient's other comorbidities and being a fall risk very difficult to get any other type of pain medications.  Discussed icing regimen and home exercises, discussed avoiding certain activities.  Follow-up with me again 2 months

## 2020-06-14 NOTE — Patient Instructions (Signed)
Good to see you Knee and hip injection today We can consider epidural in the back but I know you don't want to Pennsaid 2 times a day See me again in 7-8 weeks

## 2020-06-20 ENCOUNTER — Other Ambulatory Visit: Payer: Self-pay

## 2020-06-20 ENCOUNTER — Ambulatory Visit (INDEPENDENT_AMBULATORY_CARE_PROVIDER_SITE_OTHER): Payer: Medicare Other | Admitting: Family Medicine

## 2020-06-20 ENCOUNTER — Ambulatory Visit (INDEPENDENT_AMBULATORY_CARE_PROVIDER_SITE_OTHER): Payer: Medicare Other

## 2020-06-20 VITALS — BP 126/60 | HR 92 | Temp 97.6°F | Resp 20 | Wt 202.8 lb

## 2020-06-20 DIAGNOSIS — N400 Enlarged prostate without lower urinary tract symptoms: Secondary | ICD-10-CM | POA: Diagnosis not present

## 2020-06-20 DIAGNOSIS — I10 Essential (primary) hypertension: Secondary | ICD-10-CM

## 2020-06-20 DIAGNOSIS — H6123 Impacted cerumen, bilateral: Secondary | ICD-10-CM | POA: Diagnosis not present

## 2020-06-20 DIAGNOSIS — Z0000 Encounter for general adult medical examination without abnormal findings: Secondary | ICD-10-CM

## 2020-06-20 DIAGNOSIS — G63 Polyneuropathy in diseases classified elsewhere: Secondary | ICD-10-CM

## 2020-06-20 NOTE — Patient Instructions (Signed)

## 2020-06-20 NOTE — Patient Instructions (Signed)
Darin [name] , Thank you for taking time to come for your Medicare Wellness Visit. I appreciate your ongoing commitment to your health goals. Please review the following plan we discussed and let me know if I can assist you in the future.   Screening recommendations/referrals: Colonoscopy: No longer required Recommended yearly ophthalmology/optometry visit for glaucoma screening and checkup Recommended yearly dental visit for hygiene and checkup  Vaccinations: Influenza vaccine: Up to date Pneumococcal vaccine: Up to date Tdap vaccine: Up to date Shingles vaccine: Shingrix discussed. Please contact your pharmacy for coverage information.    Covid-19: Completed 1/8, & 04/23/19 Pt stated booster given and unsure of exact Date  Advanced directives: Copies in chart  Conditions/risks identified: Get stronger with exercise   Next appointment: Follow up in one year for your annual wellness visit.   Preventive Care 85 Years and Older, Male Preventive care refers to lifestyle choices and visits with your health care provider that can promote health and wellness. What does preventive care include?  A yearly physical exam. This is also called an annual well check.  Dental exams once or twice a year.  Routine eye exams. Ask your health care provider how often you should have your eyes checked.  Personal lifestyle choices, including:  Daily care of your teeth and gums.  Regular physical activity.  Eating a healthy diet.  Avoiding tobacco and drug use.  Limiting alcohol use.  Practicing safe sex.  Taking low doses of aspirin every day.  Taking vitamin and mineral supplements as recommended by your health care provider. What happens during an annual well check? The services and screenings done by your health care provider during your annual well check will depend on your age, overall health, lifestyle risk factors, and family history of disease. Counseling  Your health care  provider may ask you questions about your:  Alcohol use.  Tobacco use.  Drug use.  Emotional well-being.  Home and relationship well-being.  Sexual activity.  Eating habits.  History of falls.  Memory and ability to understand (cognition).  Work and work Statistician. Screening  You may have the following tests or measurements:  Height, weight, and BMI.  Blood pressure.  Lipid and cholesterol levels. These may be checked every 5 years, or more frequently if you are over 46 years old.  Skin check.  Lung cancer screening. You may have this screening every year starting at age 61 if you have a 30-pack-year history of smoking and currently smoke or have quit within the past 15 years.  Fecal occult blood test (FOBT) of the stool. You may have this test every year starting at age 35.  Flexible sigmoidoscopy or colonoscopy. You may have a sigmoidoscopy every 5 years or a colonoscopy every 10 years starting at age 86.  Prostate cancer screening. Recommendations will vary depending on your family history and other risks.  Hepatitis C blood test.  Hepatitis B blood test.  Sexually transmitted disease (STD) testing.  Diabetes screening. This is done by checking your blood sugar (glucose) after you have not eaten for a while (fasting). You may have this done every 1-3 years.  Abdominal aortic aneurysm (AAA) screening. You may need this if you are a current or former smoker.  Osteoporosis. You may be screened starting at age 33 if you are at high risk. Talk with your health care provider about your test results, treatment options, and if necessary, the need for more tests. Vaccines  Your health care provider may recommend certain  vaccines, such as:  Influenza vaccine. This is recommended every year.  Tetanus, diphtheria, and acellular pertussis (Tdap, Td) vaccine. You may need a Td booster every 10 years.  Zoster vaccine. You may need this after age 30.  Pneumococcal  13-valent conjugate (PCV13) vaccine. One dose is recommended after age 101.  Pneumococcal polysaccharide (PPSV23) vaccine. One dose is recommended after age 16. Talk to your health care provider about which screenings and vaccines you need and how often you need them. This information is not intended to replace advice given to you by your health care provider. Make sure you discuss any questions you have with your health care provider. Document Released: 04/07/2015 Document Revised: 11/29/2015 Document Reviewed: 01/10/2015 Elsevier Interactive Patient Education  2017 [place] Prevention in the Home Falls can cause injuries. They can happen to people of all ages. There are many things you can do to make your home safe and to help prevent falls. What can I do on the outside of my home?  Regularly fix the edges of walkways and driveways and fix any cracks.  Remove anything that might make you trip as you walk through a door, such as a raised step or threshold.  Trim any bushes or trees on the path to your home.  Use bright outdoor lighting.  Clear any walking paths of anything that might make someone trip, such as rocks or tools.  Regularly check to see if handrails are loose or broken. Make sure that both sides of any steps have handrails.  Any raised decks and porches should have guardrails on the edges.  Have any leaves, snow, or ice cleared regularly.  Use sand or salt on walking paths during winter.  Clean up any spills in your garage right away. This includes oil or grease spills. What can I do in the bathroom?  Use night lights.  Install grab bars by the toilet and in the tub and shower. Do not use towel bars as grab bars.  Use non-skid mats or decals in the tub or shower.  If you need to sit down in the shower, use a plastic, non-slip stool.  Keep the floor dry. Clean up any water that spills on the floor as soon as it happens.  Remove soap buildup in the  tub or shower regularly.  Attach bath mats securely with double-sided non-slip rug tape.  Do not have throw rugs and other things on the floor that can make you trip. What can I do in the bedroom?  Use night lights.  Make sure that you have a light by your bed that is easy to reach.  Do not use any sheets or blankets that are too big for your bed. They should not hang down onto the floor.  Have a firm chair that has side arms. You can use this for support while you get dressed.  Do not have throw rugs and other things on the floor that can make you trip. What can I do in the kitchen?  Clean up any spills right away.  Avoid walking on wet floors.  Keep items that you use a lot in easy-to-reach places.  If you need to reach something above you, use a strong step stool that has a grab bar.  Keep electrical cords out of the way.  Do not use floor polish or wax that makes floors slippery. If you must use wax, use non-skid floor wax.  Do not have throw rugs and other things  on the floor that can make you trip. What can I do with my stairs?  Do not leave any items on the stairs.  Make sure that there are handrails on both sides of the stairs and use them. Fix handrails that are broken or loose. Make sure that handrails are as long as the stairways.  Check any carpeting to make sure that it is firmly attached to the stairs. Fix any carpet that is loose or worn.  Avoid having throw rugs at the top or bottom of the stairs. If you do have throw rugs, attach them to the floor with carpet tape.  Make sure that you have a light switch at the top of the stairs and the bottom of the stairs. If you do not have them, ask someone to add them for you. What else can I do to help prevent falls?  Wear shoes that:  Do not have high heels.  Have rubber bottoms.  Are comfortable and fit you well.  Are closed at the toe. Do not wear sandals.  If you use a stepladder:  Make sure that it  is fully opened. Do not climb a closed stepladder.  Make sure that both sides of the stepladder are locked into place.  Ask someone to hold it for you, if possible.  Clearly mark and make sure that you can see:  Any grab bars or handrails.  First and last steps.  Where the edge of each step is.  Use tools that help you move around (mobility aids) if they are needed. These include:  Canes.  Walkers.  Scooters.  Crutches.  Turn on the lights when you go into a dark area. Replace any light bulbs as soon as they burn out.  Set up your furniture so you have a clear path. Avoid moving your furniture around.  If any of your floors are uneven, fix them.  If there are any pets around you, be aware of where they are.  Review your medicines with your doctor. Some medicines can make you feel dizzy. This can increase your chance of falling. Ask your doctor what other things that you can do to help prevent falls. This information is not intended to replace advice given to you by your health care provider. Make sure you discuss any questions you have with your health care provider. Document Released: 01/05/2009 Document Revised: 08/17/2015 Document Reviewed: 04/15/2014 Elsevier Interactive Patient Education  2017 [employer].

## 2020-06-20 NOTE — Progress Notes (Addendum)
Subjective:   Darin [name] is a 87 y.o. male who presents for Medicare Annual/Subsequent preventive examination.  Review of Systems     Cardiac Risk Factors include: advanced age (>59men, >60 women);hypertension;dyslipidemia;male gender     Objective:    Today's Vitals   06/20/20 0812  BP: 126/60  Pulse: 92  Resp: 20  Temp: 97.6 F (36.4 C)  SpO2: 98%  Weight: 202 lb 12.8 oz (92 kg)   Body mass index is 26.76 kg/m.  Advanced Directives 06/20/2020 03/12/2018 10/20/2017 03/12/2017 03/12/2017 10/15/2016 06/11/2016  Does Patient Have a Medical Advance Directive? Yes Yes Yes Yes Yes Yes Yes  Type of Paramedic of [place];Living will [place];Living will - - [place];Living will;Out of facility DNR (pink MOST or yellow form) [place];Living will [place];Living will  Does patient want to make changes to medical advance directive? - No - Patient declined - No - Patient declined - - -  Copy of [place] in Chart? Yes - validated most recent copy scanned in chart (See row information) Yes - validated most recent copy scanned in chart (See row information) - - No - copy requested No - copy requested Yes  Pre-existing out of facility DNR order (yellow form or pink MOST form) - - - - - - -    Current Medications (verified) Outpatient Encounter Medications as of 06/20/2020  Medication Sig  . amLODipine (NORVASC) 5 MG tablet TAKE 1 TABLET DAILY  . benazepril-hydrochlorthiazide (LOTENSIN HCT) 10-12.5 MG tablet TAKE 1 TABLET DAILY  . famotidine (PEPCID) 20 MG tablet TAKE 1 TABLET AT BEDTIME  . finasteride (PROSCAR) 5 MG tablet TAKE 1 TABLET EACH DAY.  Marland Kitchen gabapentin (NEURONTIN) 100 MG capsule TAKE 1 CAPSULE 4 TIMES     DAILY  . magnesium oxide (MAG-OX) 400 MG tablet Take 400 mg by mouth daily.  . polyethylene glycol (MIRALAX / GLYCOLAX) packet Take 17 g by mouth daily as  needed for moderate constipation.  . triamcinolone ointment (KENALOG) 0.1 % Apply 1 application topically 2 (two) times daily. To affected area  . vitamin C (ASCORBIC ACID) 500 MG tablet Take 500 mg by mouth daily.  Marland Kitchen VITAMIN D PO Take by mouth.  . [DISCONTINUED] mupirocin ointment (BACTROBAN) 2 %  (Patient not taking: Reported on 06/20/2020)   No facility-administered encounter medications on file as of 06/20/2020.    Allergies (verified) Statins   History: Past Medical History:  Diagnosis Date  . Arthritis   . GERD (gastroesophageal reflux disease)   . H/O hiatal hernia    "FOR YEARS" " NO PROBLEM"  . Hyperlipidemia   . Hypertension   . Knee pain   . Neuropathy    PERIPHERAL   . Osteoporosis   . PMR (polymyalgia rheumatica) (HCC)   . Polymyalgia ([place]) 5 YEARS   Past Surgical History:  Procedure Laterality Date  . CARPAL TUNNEL RELEASE    . CATARACT EXTRACTION     right  . COLONOSCOPY  2007  . INGUINAL HERNIA REPAIR  10/14/2011   Procedure: HERNIA REPAIR INGUINAL ADULT;  Surgeon: [name], MD;  Location: [facility];  Service: General;  Laterality: Right;  Right Inguinal Hernia Repair with Mesh  . TONSILLECTOMY    . VARICOSE VEIN SURGERY     Family History  Problem Relation Age of Onset  . Heart disease Father   . Coronary artery disease Other    Social History  Socioeconomic History  . Marital status: Widowed    Spouse name: Not on file  . Number of children: Not on file  . Years of education: Not on file  . Highest education level: Not on file  Occupational History  . Occupation: retired    Fish farm manager: RETIRED  Tobacco Use  . Smoking status: Never Smoker  . Smokeless tobacco: Never Used  Vaping Use  . Vaping Use: Never used  Substance and Sexual Activity  . Alcohol use: Yes    Alcohol/week: 3.0 standard drinks    Types: 3 Glasses of wine per week    Comment: half glass wine before dinner  . Drug use: No  . Sexual activity: Yes  Other Topics  Concern  . Not on file  Social History Narrative  . Not on file   Social Determinants of Health   Financial Resource Strain: Low Risk   . Difficulty of Paying Living Expenses: Not hard at all  Food Insecurity: No Food Insecurity  . Worried About Charity fundraiser in the Last Year: Never true  . Ran Out of Food in the Last Year: Never true  Transportation Needs: No Transportation Needs  . Lack of Transportation (Medical): No  . Lack of Transportation (Non-Medical): No  Physical Activity: Sufficiently Active  . Days of Exercise per Week: 7 days  . Minutes of Exercise per Session: 30 min  Stress: No Stress Concern Present  . Feeling of Stress : Not at all  Social Connections: Moderately Isolated  . Frequency of Communication with Friends and Family: Twice a week  . Frequency of Social Gatherings with Friends and Family: More than three times a week  . Attends Religious Services: More than 4 times per year  . Active Member of Clubs or Organizations: No  . Attends Archivist Meetings: Never  . Marital Status: Widowed    Tobacco Counseling Counseling given: Not Answered   Clinical Intake:  Pre-visit preparation completed: Yes  Pain : No/denies pain     BMI - recorded: 26.76 Nutritional Status: BMI <19  Underweight Nutritional Risks: None Diabetes: No  How often do you need to have someone help you when you read instructions, pamphlets, or other written materials from your doctor or pharmacy?: 1 - Never  Diabetic?No  Interpreter Needed?: No  Information entered by :: Charlott Rakes ,LPN   Activities of Daily Living In your present state of health, do you have any difficulty performing the following activities: 06/20/2020  Hearing? N  Vision? N  Difficulty concentrating or making decisions? Y  Comment memory at times STM  Walking or climbing stairs? Y  Dressing or bathing? N  Doing errands, shopping? N  Preparing Food and eating ? N  Using the  Toilet? N  In the past six months, have you accidently leaked urine? N  Do you have problems with loss of bowel control? N  Managing your Medications? N  Managing your Finances? N  Housekeeping or managing your Housekeeping? N  Some recent data might be hidden    Patient Care Team: [name], MD as PCP - General (Family Medicine) Gean Birchwood, DPM as Consulting Physician (Podiatry) Viona Gilmore, [facility] as Pharmacist (Pharmacist)  Indicate any recent Medical Services you may have received from other than [facility] providers in the past year (date may be approximate).     Assessment:   This is a routine wellness examination for Abram.  Hearing/Vision screen  Hearing Screening   125Hz   250Hz  500Hz  1000Hz  2000Hz  3000Hz  4000Hz  6000Hz  8000Hz   Right ear:           Left ear:           Comments: Pt denies any hering issues   Vision Screening Comments: Pt follows up with Dr Tommy Rainwater for annual eye exams   Dietary issues and exercise activities discussed: Current Exercise Habits: Home exercise routine;Structured exercise class, Type of exercise: walking;Other - see comments (works with Physiological scientist 2 times a week), Time (Minutes): 30, Frequency (Times/Week): 7, Weekly Exercise (Minutes/Week): 210  Goals    .  <enter goal here> (pt-stated)      Improve my ability to walk by going to the gym.     Marland Kitchen  Exercise 150 min/wk Moderate Activity (pt-stated)      Try to incorporate the PT exercises during the day     .  Patient Stated      Take care of your self;  Try some of your PT exercises !      .  Patient Stated      Sherron Monday get stronger    .  Pharmacy Care Plan      CARE PLAN ENTRY (see longitudinal plan of care for additional care plan information)  Current Barriers:  . Chronic Disease Management support, education, and care coordination needs related to Hypertension, Hyperlipidemia, GERD, BPH, and prevention of heart events and neuropathy   Hypertension BP Readings  from Last 3 Encounters:  01/19/20 118/60  11/17/19 120/60  11/10/19 138/64   . Pharmacist Clinical Goal(s): o Over the next 180 days, patient will work with PharmD and providers to maintain BP goal <140/90 . Current regimen:  . Amlodipine 5 mg 1 tablet daily . Benazepril-HCTZ 10-12.5 mg 1 tablet daily . Interventions: o Discussed DASH eating plan recommendations: . Emphasizes vegetables, fruits, and whole-grains . Includes fat-free or low-fat dairy products, fish, poultry, beans, nuts, and vegetable oils . Limits foods that are high in saturated fat. These foods include fatty meats, full-fat dairy products, and tropical oils such as coconut, palm kernel, and palm oils. . Limits sugar-sweetened beverages and sweets . Limiting sodium intake to < 1500 mg/day o Discussed recommendations for moderate aerobic exercise for 150 minutes/week spread out over 5 days for heart healthy lifestyle . Patient self care activities - Over the next 180 days, patient will: o Check blood pressure weekly, document, and provide at future appointments o Ensure daily salt intake < 2300 mg/day  Hyperlipidemia Lab Results  Component Value Date/Time   LDLCALC 116 (H) 03/30/2012 02:15 AM   LDLDIRECT 148.5 12/12/2009 12:03 PM   . Pharmacist Clinical Goal(s): o Over the next 180 days, patient will work with PharmD and providers to achieve LDL goal < 100 . Current regimen: . Niacin 500 mg 1 tablet BID with meals . Interventions: o Discussed making a follow up appointment with Dr. Elease Hashimoto for his annual exam . Patient self care activities - Over the next 180 days, patient will: o Continue current medication  Neuropathy . Pharmacist Clinical Goal(s): o Over the next 180 days, patient will work with PharmD and providers to manage symptoms of neuropathy. . Current regimen:  o Gabapentin 100 mg 1 capsule four times daily . Patient self care activities - Over the next 180 days, patient will: o Continue current  medications  Prevention of heart events . Pharmacist Clinical Goal(s) o Over the next 180 days, patient will work with PharmD and providers to prevent heart events .  Current regimen:  o Aspirin 81 mg 1 tablet daily . Interventions: o Discussed monitoring for signs of bleeding such as unexplained and excessive bleeding from a cut or injury, easy or excessive bruising, blood in urine or stools, and nosebleeds without a known cause . Patient self care activities - Over the next 180 days, patient will: o Continue current medication  BPH Lab Results  Component Value Date   PSA 0.43 06/08/2018   PSA 1.39 12/12/2009   PSA 1.41 12/28/2007   . Pharmacist Clinical Goal(s) o Over the next 180 days, patient will work with PharmD and providers to manage symptoms of enlarged prostate . Current regimen:  o Finasteride 5 mg 1 tablet daily . Patient self care activities - Over the next 180 days, patient will: o Continue current medications  GERD . Pharmacist Clinical Goal(s) o Over the next 180 days, patient will work with PharmD and providers to manage symptoms of heartburn . Current regimen:  o Famotidine 20 mg 1 tablet at bedtime . Interventions: o Discussed non-pharmacologic management of symptoms such as elevating the head of your bed, avoiding eating 2-3 hours before bed, avoiding triggering foods such as acidic, spicy, or fatty foods, eating smaller meals, and wearing clothes that are loose around the waist  . Patient self care activities - Over the next 180 days, patient will: o Continue current medication  Medication management . Pharmacist Clinical Goal(s): o Over the next 180 days, patient will work with PharmD and providers to achieve optimal medication adherence . Current pharmacy: [facility] . Interventions o Comprehensive medication review performed. o Continue current medication management strategy . Patient self care activities - Over the next 180 days, patient  will: o Take medications as prescribed o Report any questions or concerns to PharmD and/or provider(s)  Initial goal documentation       Depression Screen PHQ 2/9 Scores 06/20/2020 02/25/2020 10/20/2017 10/15/2016 07/19/2016 06/15/2015 12/30/2014  PHQ - 2 Score 0 0 0 0 0 0 0    Fall Risk Fall Risk  06/20/2020 02/25/2020 10/20/2017 10/15/2016 07/19/2016  Falls in the past year? 1 0 Yes No Yes  Comment - - due to GI Bleed  - -  Number falls in past yr: 1 - 1 - 1  Injury with Fall? 0 - Yes - No  Risk Factor Category  - - High Fall Risk - -  Risk for fall due to : Impaired vision;Impaired balance/gait - Impaired balance/gait - -  Risk for fall due to: Comment related to walker use - - - -  Follow up Falls prevention discussed - Education provided - -    FALL RISK PREVENTION PERTAINING TO THE HOME:  Any stairs in or around the home? No  If so, are there any without handrails? No  Home free of loose throw rugs in walkways, pet beds, electrical cords, etc? Yes  Adequate lighting in your home to reduce risk of falls? Yes   ASSISTIVE DEVICES UTILIZED TO PREVENT FALLS:  Life alert? No  Use of a cane, walker or w/c? Yes  Grab bars in the bathroom? Yes  Shower chair or bench in shower? Yes  Elevated toilet seat or a handicapped toilet? Yes   TIMED UP AND GO:  Was the test performed? Yes .  Length of time to ambulate 10 feet: 15 sec.   Gait slow and steady with assistive device  Cognitive Function: MMSE - Mini Mental State Exam 10/20/2017  Not completed: (No Data)  6CIT Screen 06/20/2020  What Year? 0 points  What month? 0 points  Count back from 20 0 points  Months in reverse 0 points  Repeat phrase 0 points    Immunizations Immunization History  Administered Date(s) Administered  . Hep A / Hep B 06/15/2012, 07/17/2012, 12/18/2012  . Influenza Whole 03/26/2003, 01/12/2007  . Influenza, High Dose Seasonal PF 01/27/2015, 12/21/2015  . Influenza,inj,Quad PF,6+ Mos 03/08/2014   . Influenza-Unspecified 12/22/2019  . PFIZER(Purple Top)SARS-COV-2 Vaccination 04/02/2019, 04/23/2019  . Pneumococcal Conjugate-13 11/16/2013  . Pneumococcal Polysaccharide-23 12/12/2009  . Td 03/26/2003  . Tdap 06/15/2015    TDAP status: Up to date  Flu Vaccine status: Up to date  Pneumococcal vaccine status: Up to date  Covid-19 vaccine status: Completed vaccines  Qualifies for Shingles Vaccine? Yes   Zostavax completed No   Shingrix Completed?: No.    Education has been provided regarding the importance of this vaccine. Patient has been advised to call insurance company to determine out of pocket expense if they have not yet received this vaccine. Advised may also receive vaccine at local pharmacy or Health Dept. Verbalized acceptance and understanding.  Screening Tests Health Maintenance  Topic Date Due  . COVID-19 Vaccine (3 - Pfizer risk 4-dose series) 05/21/2019  . TETANUS/TDAP  06/14/2025  . INFLUENZA VACCINE  Completed  . PNA vac Low Risk Adult  Completed  . HPV VACCINES  Aged Out    Health Maintenance  Health Maintenance Due  Topic Date Due  . COVID-19 Vaccine (3 - Pfizer risk 4-dose series) 05/21/2019    Colorectal cancer screening: No longer required.    Additional Screening:  Vision Screening: Recommended annual ophthalmology exams for early detection of glaucoma and other disorders of the eye. Is the patient up to date with their annual eye exam?  Yes  Who is the provider or what is the name of the office in which the patient attends annual eye exams? Dr Tommy Rainwater  If pt is not established with a provider, would they like to be referred to a provider to establish care? No .   Dental Screening: Recommended annual dental exams for proper oral hygiene  Community Resource Referral / Chronic Care Management: CRR required this visit?  No   CCM required this visit?  No      Plan:     I have personally reviewed and noted the following in the patient's  chart:   . Medical and social history . Use of alcohol, tobacco or illicit drugs  . Current medications and supplements . Functional ability and status . Nutritional status . Physical activity . Advanced directives . List of other physicians . Hospitalizations, surgeries, and ER visits in previous 12 months . Vitals . Screenings to include cognitive, depression, and falls . Referrals and appointments  In addition, I have reviewed and discussed with patient certain preventive protocols, quality metrics, and best practice recommendations. A written personalized care plan for preventive services as well as general preventive health recommendations were provided to patient.     Willette Brace, LPN   [id number]   Nurse Notes: Pt stated increase discomfort and wears a knee brace related to arthritis pain

## 2020-06-20 NOTE — Progress Notes (Signed)
Established Patient Office Visit  Subjective:  Patient ID: Darin [name], male    DOB: [birth year]  Age: 87 y.o. MRN: [id number]  CC: No chief complaint on file.   HPI Darin [name] presents for medical follow-up and bilateral cerumen impaction.  He has had frequent cerumen impactions in the past.  This does impair his hearing somewhat.  No drainage.  No ear pain.  He has had his ears irrigated previously  He has had some ongoing issues with knee pain and has been followed by sports medicine.  He has hypertension which is treated with amlodipine 5 mg daily and benazepril HCTZ 10/12.5 mg daily.  No recent dizziness.  No significant peripheral edema.  No headaches.  No chest pains.  He had renal profile and electrolytes and renal function stable back in December.  He has history of BPH and symptoms are stable on finasteride.  No slow stream.  He has history of polyneuropathy and takes gabapentin for that and symptoms are fairly well controlled.  Past Medical History:  Diagnosis Date  . Arthritis   . GERD (gastroesophageal reflux disease)   . H/O hiatal hernia    "FOR YEARS" " NO PROBLEM"  . Hyperlipidemia   . Hypertension   . Knee pain   . Neuropathy    PERIPHERAL   . Osteoporosis   . PMR (polymyalgia rheumatica) (HCC)   . Polymyalgia ([place]) 5 YEARS    Past Surgical History:  Procedure Laterality Date  . CARPAL TUNNEL RELEASE    . CATARACT EXTRACTION     right  . COLONOSCOPY  2007  . INGUINAL HERNIA REPAIR  10/14/2011   Procedure: HERNIA REPAIR INGUINAL ADULT;  Surgeon: [name], MD;  Location: [facility];  Service: General;  Laterality: Right;  Right Inguinal Hernia Repair with Mesh  . TONSILLECTOMY    . VARICOSE VEIN SURGERY      Family History  Problem Relation Age of Onset  . Heart disease Father   . Coronary artery disease Other     Social History   Socioeconomic History  . Marital status: Widowed    Spouse name: Not on file  . Number of children: Not on  file  . Years of education: Not on file  . Highest education level: Not on file  Occupational History  . Occupation: retired    Fish farm manager: RETIRED  Tobacco Use  . Smoking status: Never Smoker  . Smokeless tobacco: Never Used  Vaping Use  . Vaping Use: Never used  Substance and Sexual Activity  . Alcohol use: Yes    Alcohol/week: 3.0 standard drinks    Types: 3 Glasses of wine per week    Comment: half glass wine before dinner  . Drug use: No  . Sexual activity: Yes  Other Topics Concern  . Not on file  Social History Narrative  . Not on file   Social Determinants of Health   Financial Resource Strain: Low Risk   . Difficulty of Paying Living Expenses: Not hard at all  Food Insecurity: No Food Insecurity  . Worried About Charity fundraiser in the Last Year: Never true  . Ran Out of Food in the Last Year: Never true  Transportation Needs: No Transportation Needs  . Lack of Transportation (Medical): No  . Lack of Transportation (Non-Medical): No  Physical Activity: Sufficiently Active  . Days of Exercise per Week: 7 days  . Minutes of Exercise per Session: 30 min  Stress: No Stress Concern Present  .  Feeling of Stress : Not at all  Social Connections: Moderately Isolated  . Frequency of Communication with Friends and Family: Twice a week  . Frequency of Social Gatherings with Friends and Family: More than three times a week  . Attends Religious Services: More than 4 times per year  . Active Member of Clubs or Organizations: No  . Attends Archivist Meetings: Never  . Marital Status: Widowed  Intimate Partner Violence: Not At Risk  . Fear of Current or Ex-Partner: No  . Emotionally Abused: No  . Physically Abused: No  . Sexually Abused: No    Outpatient Medications Prior to Visit  Medication Sig Dispense Refill  . amLODipine (NORVASC) 5 MG tablet TAKE 1 TABLET DAILY 90 tablet 3  . benazepril-hydrochlorthiazide (LOTENSIN HCT) 10-12.5 MG tablet TAKE 1 TABLET  DAILY 90 tablet 1  . famotidine (PEPCID) 20 MG tablet TAKE 1 TABLET AT BEDTIME 90 tablet 1  . finasteride (PROSCAR) 5 MG tablet TAKE 1 TABLET EACH DAY. 90 tablet 3  . gabapentin (NEURONTIN) 100 MG capsule TAKE 1 CAPSULE 4 TIMES     DAILY 360 capsule 3  . magnesium oxide (MAG-OX) 400 MG tablet Take 400 mg by mouth daily.    . polyethylene glycol (MIRALAX / GLYCOLAX) packet Take 17 g by mouth daily as needed for moderate constipation.    . triamcinolone ointment (KENALOG) 0.1 % Apply 1 application topically 2 (two) times daily. To affected area 30 g 1  . vitamin C (ASCORBIC ACID) 500 MG tablet Take 500 mg by mouth daily.     No facility-administered medications prior to visit.    Allergies  Allergen Reactions  . Statins Other (See Comments)    Muscle pain and upset stomach    ROS Review of Systems  Constitutional: Negative for fatigue and unexpected weight change.  Eyes: Negative for visual disturbance.  Respiratory: Negative for cough, chest tightness and shortness of breath.   Cardiovascular: Negative for chest pain, palpitations and leg swelling.  Neurological: Negative for dizziness, syncope, weakness, light-headedness and headaches.      Objective:    Physical Exam Constitutional:      Appearance: He is well-developed.  HENT:     Ears:     Comments: Bilateral cerumen impactions.  These were removed with curette without difficulty and patient tolerated well and eardrums appear normal.  Ear canals appear normal. Eyes:     Pupils: Pupils are equal, round, and reactive to light.  Neck:     Thyroid: No thyromegaly.  Cardiovascular:     Rate and Rhythm: Normal rate and regular rhythm.  Pulmonary:     Effort: Pulmonary effort is normal. No respiratory distress.     Breath sounds: Normal breath sounds. No wheezing or rales.  Musculoskeletal:     Cervical back: Neck supple.     Right lower leg: No edema.     Left lower leg: No edema.  Neurological:     Mental Status: He is  alert and oriented to person, place, and time.     There were no vitals taken for this visit. Wt Readings from Last 3 Encounters:  06/20/20 202 lb 12.8 oz (92 kg)  06/14/20 203 lb (92.1 kg)  04/19/20 203 lb (92.1 kg)     Health Maintenance Due  Topic Date Due  . COVID-19 Vaccine (3 - Pfizer risk 4-dose series) 05/21/2019    There are no preventive care reminders to display for this patient.  Lab Results  Component Value Date   TSH 2.471 03/29/2012   Lab Results  Component Value Date   WBC 6.7 05/02/2017   HGB 11.8 (L) 05/02/2017   HCT 35.8 (L) 05/02/2017   MCV 90.9 05/02/2017   PLT 265 05/02/2017   Lab Results  Component Value Date   NA 139 02/25/2020   K 4.1 02/25/2020   CO2 29 02/25/2020   GLUCOSE 78 02/25/2020   BUN 16 02/25/2020   CREATININE 0.73 02/25/2020   BILITOT 0.6 03/12/2017   ALKPHOS 52 03/12/2017   AST 19 03/12/2017   ALT 15 (L) 03/12/2017   PROT 6.1 (L) 03/12/2017   ALBUMIN 3.1 (L) 03/12/2017   CALCIUM 9.8 02/25/2020   ANIONGAP 6 03/16/2017   GFR 102.17 04/08/2018   Lab Results  Component Value Date   CHOL 228 (H) 03/30/2012   Lab Results  Component Value Date   HDL 100 03/30/2012   Lab Results  Component Value Date   LDLCALC 116 (H) 03/30/2012   Lab Results  Component Value Date   TRIG 61 03/30/2012   Lab Results  Component Value Date   CHOLHDL 2.3 03/30/2012   No results found for: HGBA1C    Assessment & Plan:   #1 hypertension stable and at goal -Continue amlodipine and benazepril HCTZ at current dosage.  Refills recently given  #2 bilateral cerumen impaction.  Discussed risk of removal with curette including low risk of TM perforation and low risk of bleeding and patient consented.  Large plugs of cerumen were removed from both canals without difficulty.  Patient tolerated well  #3 BPH symptomatically stable on finasteride -Continue finasteride 5 mg daily  #4 polyneuropathy treated with gabapentin and  stable -Continue gabapentin 100 mg 4 times daily  No orders of the defined types were placed in this encounter.   Follow-up: No follow-ups on file.    [name], MD

## 2020-07-14 ENCOUNTER — Other Ambulatory Visit: Payer: Self-pay

## 2020-07-14 ENCOUNTER — Encounter: Payer: Self-pay | Admitting: Podiatry

## 2020-07-14 ENCOUNTER — Ambulatory Visit (INDEPENDENT_AMBULATORY_CARE_PROVIDER_SITE_OTHER): Payer: Medicare Other | Admitting: Podiatry

## 2020-07-14 DIAGNOSIS — M79675 Pain in left toe(s): Secondary | ICD-10-CM

## 2020-07-14 DIAGNOSIS — G609 Hereditary and idiopathic neuropathy, unspecified: Secondary | ICD-10-CM

## 2020-07-14 DIAGNOSIS — B351 Tinea unguium: Secondary | ICD-10-CM | POA: Diagnosis not present

## 2020-07-14 DIAGNOSIS — M79674 Pain in right toe(s): Secondary | ICD-10-CM

## 2020-07-14 NOTE — Progress Notes (Signed)
This patient returns to my office for at risk foot care.  This patient requires this care by a professional since this patient will be at risk due to having idiopathic neuropathy.  This patient is unable to cut nails himself since the patient cannot reach his nails.These nails are painful walking and wearing shoes.  This patient presents for at risk foot care today.  General Appearance  Alert, conversant and in no acute stress.  Vascular  Dorsalis pedis and posterior tibial  pulses are weaklabsent   palpable  bilaterally.  Capillary return is within normal limits  Bilaterally.Cold feet.  bilaterally.  Neurologic  Senn-Weinstein monofilament wire test within normal limits  bilaterally. Muscle power within normal limits bilaterally.  Nails Thick disfigured discolored nails with subungual debris  from hallux to fifth toes bilaterally. Pincer nails  B/L. No evidence of bacterial infection or drainage bilaterally.  Orthopedic  No limitations of motion  feet .  No crepitus or effusions noted.  No bony pathology or digital deformities noted.  Skin  normotropic skin with no porokeratosis noted bilaterally.  No signs of infections or ulcers noted.     Onychomycosis  Pain in right toes  Pain in left toes  Consent was obtained for treatment procedures.   Mechanical debridement of nails 1-5  bilaterally performed with a nail nipper.  Filed with dremel without incident.    Return office visit   3 months                  Told patient to return for periodic foot care and evaluation due to potential at risk complications.   Gardiner Barefoot DPM

## 2020-07-27 NOTE — Progress Notes (Signed)
[place] [place] Phone: [phone] Subjective:   Darin [name], am serving as a scribe for Dr. Hulan Saas. This visit occurred during the SARS-CoV-2 public health emergency.  Safety protocols were in place, including screening questions prior to the visit, additional usage of staff PPE, and extensive cleaning of exam room while observing appropriate contact time as indicated for disinfecting solutions.   I'm seeing this patient by the request  of:  [name], MD  CC: Multiple complaints follow-up  GUR:[id number]   06/14/2020 I do believe that some of the pain is coming from patient's radicular back pain.  Patient though does not want to undergo another epidural.  We will continue to monitor otherwise at this time.  Update 07/28/2020 Darin [name] is a 87 y.o. male coming in with complaint of back, L hip bursitis, R knee pain. Patient states that he is wearing brace on R knee. Some days he has giving away sensation more often in this leg.   Also c/o left-sided lumbar spine pain that is more than usually. Denies any radiating symptoms. L glute is sore.  Patient due to his orthopedic complaints though will be missing 2 different weddings secondary to him not being able to travel.       Past Medical History:  Diagnosis Date  . Arthritis   . GERD (gastroesophageal reflux disease)   . H/O hiatal hernia    "FOR YEARS" " [name]"  . Hyperlipidemia   . Hypertension   . Knee pain   . Neuropathy    PERIPHERAL   . Osteoporosis   . PMR (polymyalgia rheumatica) ([facility])   . Polymyalgia ([facility]) 5 YEARS   Past Surgical History:  Procedure Laterality Date  . CARPAL TUNNEL RELEASE    . CATARACT EXTRACTION     right  . COLONOSCOPY  2007  . INGUINAL HERNIA REPAIR  10/14/2011   Procedure: HERNIA REPAIR INGUINAL ADULT;  Surgeon: [name], MD;  Location: [facility];  Service: General;  Laterality: Right;  Right  Inguinal Hernia Repair with Mesh  . TONSILLECTOMY    . VARICOSE VEIN SURGERY     Social History   Socioeconomic History  . Marital status: Widowed    Spouse name: Not on file  . Number of children: Not on file  . Years of education: Not on file  . Highest education level: Not on file  Occupational History  . Occupation: retired    Fish farm manager: RETIRED  Tobacco Use  . Smoking status: Never Smoker  . Smokeless tobacco: Never Used  Vaping Use  . Vaping Use: Never used  Substance and Sexual Activity  . Alcohol use: Yes    Alcohol/week: 3.0 standard drinks    Types: 3 Glasses of wine per week    Comment: half glass wine before dinner  . Drug use: [name]  . Sexual activity: Yes  Other Topics Concern  . Not on file  Social History Narrative  . Not on file   Social Determinants of Health   Financial Resource Strain: Low Risk   . Difficulty of Paying Living Expenses: Not hard at all  Food Insecurity: [name] Food Insecurity  . Worried About Charity fundraiser in the Last Year: Never true  . Ran Out of Food in the Last Year: Never true  Transportation Needs: [name] Transportation Needs  . Lack of Transportation (Medical): [name]  . Lack of Transportation (Non-Medical): [name]  Physical Activity:  Sufficiently Active  . Days of Exercise per Week: 7 days  . Minutes of Exercise per Session: 30 min  Stress: [name] Stress Concern Present  . Feeling of Stress : Not at all  Social Connections: Moderately Isolated  . Frequency of Communication with Friends and Family: Twice a week  . Frequency of Social Gatherings with Friends and Family: More than three times a week  . Attends Religious Services: More than 4 times per year  . Active Member of Clubs or Organizations: [name]  . Attends Archivist Meetings: Never  . Marital Status: Widowed   Allergies  Allergen Reactions  . Statins Other (See Comments)    Muscle pain and upset stomach   Family History  Problem Relation Age of Onset  . Heart  disease Father   . Coronary artery disease Other      Current Outpatient Medications (Cardiovascular):  .  amLODipine (NORVASC) 5 MG tablet, TAKE 1 TABLET DAILY .  benazepril-hydrochlorthiazide (LOTENSIN HCT) 10-12.5 MG tablet, TAKE 1 TABLET DAILY     Current Outpatient Medications (Other):  .  famotidine (PEPCID) 20 MG tablet, TAKE 1 TABLET AT BEDTIME .  finasteride (PROSCAR) 5 MG tablet, TAKE 1 TABLET EACH DAY. Marland Kitchen  gabapentin (NEURONTIN) 100 MG capsule, TAKE 1 CAPSULE 4 TIMES     DAILY .  magnesium oxide (MAG-OX) 400 MG tablet, Take 400 mg by mouth daily. .  polyethylene glycol (MIRALAX / GLYCOLAX) packet, Take 17 g by mouth daily as needed for moderate constipation. .  triamcinolone ointment (KENALOG) 0.1 %, Apply 1 application topically 2 (two) times daily. To affected area .  vitamin C (ASCORBIC ACID) 500 MG tablet, Take 500 mg by mouth daily. Marland Kitchen  VITAMIN D PO, Take by mouth.   Reviewed prior external information including notes and imaging from  primary care provider As well as notes that were available from care everywhere and other healthcare systems.  Past medical history, social, surgical and family history all reviewed in electronic medical record.  [name] unless stated regarding to the chief complaint.   Review of Systems:  [name] headache, visual changes, nausea, vomiting, diarrhea, constipation, dizziness, abdominal pain, skin rash, fevers, chills, night sweats, weight loss, swollen lymph nodes, chest pain, shortness of breath, mood changes. POSITIVE muscle aches, body aches and joint swelling  Objective  Blood pressure 120/62, pulse 84, height 6\' 1"  (1.854 m), weight 201 lb (91.2 kg), SpO2 99 %.   General: [name] apparent distress alert and oriented x3 mood and affect normal, dressed appropriately.  HEENT: Pupils equal, extraocular movements intact  Respiratory: Patient's speak in full sentences and does not appear short of breath  Cardiovascular: [name] lower  extremity edema, non tender, [name] erythema  Gait patient does have some instability with walking.  Using a walker for stability. Severe arthritic changes of the right knee noted.  Patient does have instability noted.  Patient does have effusion noted.  Lacking last 15 degrees of extension.  Instability with valgus and varus force and diffuse tenderness noted today.  Left sacroiliac joint is tender.  Paraspinal musculature also has significant tightness of the lumbar spine.  Patient has less than 5 degrees of extension of the back.  Seems to be neurovascularly intact distally.  After informed  verbal consent, patient was seated on exam table. Right knee was prepped with alcohol swab and utilizing anterolateral approach, patient's right knee space was injected with 4:1  marcaine 0.5%: Kenalog 40mg /dL. Patient tolerated the procedure well without  immediate complications.  After verbal consent patient was prepped with alcohol swab and with a 21-gauge 2 inch needle injected into the left sacroiliac joint.  Total of 2 cc of 0.5% Marcaine and 1 cc of Kenalog 40 mg/mL used [name] blood loss.  Band-Aid placed.  Postinjection instructions given    Impression and Recommendations:     The above documentation has been reviewed and is accurate and complete Darin Pulley, DO

## 2020-07-28 ENCOUNTER — Ambulatory Visit (INDEPENDENT_AMBULATORY_CARE_PROVIDER_SITE_OTHER): Payer: Medicare Other | Admitting: Family Medicine

## 2020-07-28 ENCOUNTER — Encounter: Payer: Self-pay | Admitting: Family Medicine

## 2020-07-28 ENCOUNTER — Other Ambulatory Visit: Payer: Self-pay

## 2020-07-28 DIAGNOSIS — M533 Sacrococcygeal disorders, not elsewhere classified: Secondary | ICD-10-CM | POA: Diagnosis not present

## 2020-07-28 DIAGNOSIS — M1711 Unilateral primary osteoarthritis, right knee: Secondary | ICD-10-CM | POA: Diagnosis not present

## 2020-07-28 DIAGNOSIS — G8929 Other chronic pain: Secondary | ICD-10-CM | POA: Diagnosis not present

## 2020-07-28 NOTE — Patient Instructions (Addendum)
Injected knee and SI joint today See me in 6 weeks

## 2020-07-28 NOTE — Assessment & Plan Note (Signed)
Repeat injection given today.  Discussed continuing to use the walker.  We discussed also with the possibility of the lumbar epidural but patient would really like to avoid it.  Discussed icing regimen and home exercises.  Can repeat injection every 10 to 12 weeks if necessary.  Patient will follow up note in 6 to 8 weeks and see if patient is having more pain with the greater trochanteric area.

## 2020-07-28 NOTE — Assessment & Plan Note (Signed)
Patient given injection today.  We did discuss would like to try to have the steroids frequency more likely 10 to 12 weeks.  Patient though is not a candidate for replacement.  Patient has tried the other treatment options including viscosupplementation and PRP with minimal improvement.  Patient will consider having this done if necessary but would like to continue with the steroid injections.  Warned of potential to frequently.  Patient will follow up with me again 6 to 8 weeks

## 2020-08-02 ENCOUNTER — Telehealth: Payer: Self-pay | Admitting: Pharmacist

## 2020-08-02 NOTE — Chronic Care Management (AMB) (Signed)
I left the patient a message about his upcoming appointment on 08/03/2020 @ 11:00 am  with the clinical pharmacist. He was asked to please have all medication on hand to review with the pharmacist.   Neita Goodnight) Mare Ferrari, [place] [phone]

## 2020-08-03 ENCOUNTER — Ambulatory Visit (INDEPENDENT_AMBULATORY_CARE_PROVIDER_SITE_OTHER): Payer: Medicare Other | Admitting: Pharmacist

## 2020-08-03 ENCOUNTER — Telehealth: Payer: Medicare Other

## 2020-08-03 DIAGNOSIS — N400 Enlarged prostate without lower urinary tract symptoms: Secondary | ICD-10-CM

## 2020-08-03 DIAGNOSIS — I10 Essential (primary) hypertension: Secondary | ICD-10-CM

## 2020-08-03 NOTE — Progress Notes (Signed)
Chronic Care Management Pharmacy Note  08/21/2020 Name:  Darin [name] MRN:  [id number] DOB:  1931-09-17  Subjective: Darin [name] is an 87 y.o. year old male who is a primary patient of Burchette, Alinda Sierras, MD.  The CCM team was consulted for assistance with disease management and care coordination needs.    Engaged with patient by telephone for follow up visit in response to provider referral for pharmacy case management and/or care coordination services.   Consent to Services:  The patient was given information about Chronic Care Management services, agreed to services, and gave verbal consent prior to initiation of services.  Please see initial visit note for detailed documentation.   Patient Care Team: [name], MD as PCP - General (Family Medicine) Gean Birchwood, DPM as Consulting Physician (Podiatry) Viona Gilmore, [facility] as Pharmacist (Pharmacist)  Recent office visits: 06/20/20 [name], MD: Patient presented for office visit for chronic conditions follow up and bilateral cerumen impaction.  06/20/20 Charlott Rakes, LPN: Patient presented for AWV.  02/25/20 [name], MD: Patient presented for office visit for chronic conditions follow up after CCM visit. Further advised discontinuation of niacin and aspirin.  Recent consult visits: 07/28/20 Hulan Saas, DO (sports medicine): Patient presented for pain follow up. Steroid injection administered.  07/14/20 Gardiner Barefoot, DPM (podiatry): Patient presented for toenail debridement.  06/14/20 Hulan Saas, DO (sports medicine): Patient presented for hip and knee pain follow up. Hip injection administered.  04/19/20 Hulan Saas, DO (sports medicine): Patient presented for hip and knee pain follow up. Hip injection administered.  04/14/20 Gardiner Barefoot, DPM (podiatry): Patient presented for toenail debridement.  03/22/20 Harriett Sine (dermatology): Unable to access notes.   Hospital  visits: None in previous 6 months  Objective:  Lab Results  Component Value Date   CREATININE 0.73 02/25/2020   BUN 16 02/25/2020   GFR 102.17 04/08/2018   GFRNONAA >60 03/16/2017   GFRAA >60 03/16/2017   NA 139 02/25/2020   K 4.1 02/25/2020   CALCIUM 9.8 02/25/2020   CO2 29 02/25/2020   GLUCOSE 78 02/25/2020    Lab Results  Component Value Date/Time   GFR 102.17 04/08/2018 08:46 AM   GFR 120.26 04/02/2017 11:56 AM    Last diabetic Eye exam: No results found for: HMDIABEYEEXA  Last diabetic Foot exam: No results found for: HMDIABFOOTEX   Lab Results  Component Value Date   CHOL 228 (H) 03/30/2012   HDL 100 03/30/2012   LDLCALC 116 (H) 03/30/2012   LDLDIRECT 148.5 12/12/2009   TRIG 61 03/30/2012   CHOLHDL 2.3 03/30/2012    Hepatic Function Latest Ref Rng & Units 03/12/2017 03/29/2012 03/09/2012  Total Protein 6.5 - 8.1 g/dL 6.1(L) 5.9(L) 7.0  Albumin 3.5 - 5.0 g/dL 3.1(L) 2.9(L) 3.6  AST 15 - 41 U/L _0 ALT 17 - 63 U/L 15(L) 16 21  Alk Phosphatase 38 - 126 U/L 52 44 40  Total Bilirubin 0.3 - 1.2 mg/dL 0.6 0.3 0.7  Bilirubin, Direct 0.0 - 0.3 mg/dL - <0.1 -    Lab Results  Component Value Date/Time   TSH 2.471 03/29/2012 08:35 PM   TSH 3.22 03/01/2008 10:42 AM    CBC Latest Ref Rng & Units 05/02/2017 04/02/2017 03/16/2017  WBC 3.8 - 10.8 Thousand/uL 6.7 8.1 6.7  Hemoglobin 13.2 - 17.1 g/dL 11.8(L) 11.1(L) 10.0(L)  Hematocrit 38.5 - 50.0 % 35.8(L) 33.4(L) 29.2(L)  Platelets 140 - 400 Thousand/uL 265 240.0 216    No results  found for: VD25OH  Clinical ASCVD: No  The ASCVD Risk score Mikey Bussing DC Jr., et al., 2013) failed to calculate for the following reasons:   The 2013 ASCVD risk score is only valid for ages 71 to 56    Depression screen PHQ 2/9 06/20/2020 02/25/2020 10/20/2017  Decreased Interest 0 0 0  Down, Depressed, Hopeless 0 0 0  PHQ - 2 Score 0 0 0  Some recent data might be hidden      Social History   Tobacco Use  Smoking Status Never  Smoker  Smokeless Tobacco Never Used   BP Readings from Last 3 Encounters:  07/28/20 120/62  06/20/20 126/60  06/14/20 110/60   Pulse Readings from Last 3 Encounters:  07/28/20 84  06/20/20 92  06/14/20 84   Wt Readings from Last 3 Encounters:  07/28/20 201 lb (91.2 kg)  06/20/20 202 lb 12.8 oz (92 kg)  06/14/20 203 lb (92.1 kg)   BMI Readings from Last 3 Encounters:  07/28/20 26.52 kg/m  06/20/20 26.76 kg/m  06/14/20 26.78 kg/m    Assessment/Interventions: Review of patient past medical history, allergies, medications, health status, including review of consultants reports, laboratory and other test data, was performed as part of comprehensive evaluation and provision of chronic care management services.   SDOH:  (Social Determinants of Health) assessments and interventions performed: No  SDOH Screenings   Alcohol Screen: Not on file  Depression (PHQ2-9): Low Risk   . PHQ-2 Score: 0  Financial Resource Strain: Low Risk   . Difficulty of Paying Living Expenses: Not hard at all  Food Insecurity: No Food Insecurity  . Worried About Charity fundraiser in the Last Year: Never true  . Ran Out of Food in the Last Year: Never true  Housing: Low Risk   . Last Housing Risk Score: 0  Physical Activity: Sufficiently Active  . Days of Exercise per Week: 7 days  . Minutes of Exercise per Session: 30 min  Social Connections: Moderately Isolated  . Frequency of Communication with Friends and Family: Twice a week  . Frequency of Social Gatherings with Friends and Family: More than three times a week  . Attends Religious Services: More than 4 times per year  . Active Member of Clubs or Organizations: No  . Attends Archivist Meetings: Never  . Marital Status: Widowed  Stress: No Stress Concern Present  . Feeling of Stress : Not at all  Tobacco Use: Low Risk   . Smoking Tobacco Use: Never Smoker  . Smokeless Tobacco Use: Never Used  Transportation Needs: No  Transportation Needs  . Lack of Transportation (Medical): No  . Lack of Transportation (Non-Medical): No    CCM Care Plan  Allergies  Allergen Reactions  . Statins Other (See Comments)    Muscle pain and upset stomach    Medications Reviewed Today    Reviewed by Lyndal Pulley, DO (Physician) on 07/28/20 at [phone]  Med List Status: <None>  Medication Order Taking? Sig Documenting Provider Last Dose Status Informant  amLODipine (NORVASC) 5 MG tablet [id number] Yes TAKE 1 TABLET DAILY [name], MD Taking Active   benazepril-hydrochlorthiazide (LOTENSIN HCT) 10-12.5 MG tablet [id number] Yes TAKE 1 TABLET DAILY Burchette, Alinda Sierras, MD Taking Active   famotidine (PEPCID) 20 MG tablet [id number] Yes TAKE 1 TABLET AT BEDTIME [name], MD Taking Active   finasteride (PROSCAR) 5 MG tablet [id number] Yes TAKE 1 TABLET EACH DAY. [name], MD Taking Active  gabapentin (NEURONTIN) 100 MG capsule [id number] Yes TAKE 1 CAPSULE 4 TIMES     DAILY Burchette, Alinda Sierras, MD Taking Active   magnesium oxide (MAG-OX) 400 MG tablet [id number] Yes Take 400 mg by mouth daily. [provider] Taking Active   polyethylene glycol Merril Abbe / GLYCOLAX) packet [id number] Yes Take 17 g by mouth daily as needed for moderate constipation. Aline August, MD Taking Active   triamcinolone ointment (KENALOG) 0.1 % [id number] Yes Apply 1 application topically 2 (two) times daily. To affected area Lyndal Pulley, DO Taking Active   vitamin C (ASCORBIC ACID) 500 MG tablet [id number] Yes Take 500 mg by mouth daily. [provider] Taking Active Self  VITAMIN D PO [id number] Yes Take by mouth. [provider] Taking Active           Patient Active Problem List   Diagnosis Date Noted  . Greater trochanteric bursitis of left hip 02/12/2019  . Strain of left piriformis muscle 10/05/2018  . Pain due to onychomycosis of toenails of both feet 09/23/2018  . Chronic left SI joint pain  04/27/2018  . Right wrist pain 02/05/2018  . Degenerative arthritis of right knee 10/23/2017  . Left leg cellulitis 08/12/2017  . Arthritis of right knee 05/20/2017  . Diverticulosis of colon with hemorrhage   . Acute blood loss anemia   . GIB (gastrointestinal bleeding) 03/12/2017  . Leg edema, right 02/20/2015  . BPH (benign prostatic hyperplasia) 12/09/2014  . Chronic radicular low back pain 08/06/2012  . COPD (chronic obstructive pulmonary disease) ([place]) 04/21/2012  . Syncope 03/29/2012  . Hyponatremia 03/29/2012  . Localized osteoarthrosis not specified whether primary or secondary, lower leg 06/20/2010  . VARICOSE VEINS LOWER EXTREMITIES W/INFLAMMATION 02/12/2010  . [place] LOCALIZED OSTEOARTHROSIS INVOLVING HAND 10/16/2009  . DUPUYTREN'S CONTRACTURE, RIGHT 07/04/2009  . DEGENERATIVE DISC DISEASE, LUMBOSACRAL SPINE W/RADICULOPATHY 06/19/2009  . CARCINOMA, BASAL CELL 02/07/2009  . ANEMIA OF CHRONIC DISEASE 02/07/2009  . BURSITIS, RIGHT SHOULDER 10/07/2008  . CATARACT ASSOCIATED WITH OTHER SYNDROMES 08/01/2008  . DRY SKIN 08/01/2008  . CERUMEN IMPACTION, BILATERAL 03/01/2008  . PMR (polymyalgia rheumatica) ([place]) 01/18/2008  . CRAMP IN LIMB 01/18/2008  . DEPRESSION, SITUATIONAL, ACUTE 12/28/2007  . CONSTIPATION, DRUG INDUCED 12/28/2007  . POLYNEUROPATHY OTHER DISEASES CLASSIFIED ELSW 09/24/2007  . WEIGHT LOSS, ABNORMAL 01/07/2007  . Hyperlipidemia 09/22/2006  . Essential hypertension 09/22/2006  . GERD (gastroesophageal reflux disease) 09/22/2006  . Osteoarthritis 09/22/2006    Immunization History  Administered Date(s) Administered  . Hep A / Hep B 06/15/2012, 07/17/2012, 12/18/2012  . Influenza Whole 03/26/2003, 01/12/2007  . Influenza, High Dose Seasonal PF 01/27/2015, 12/21/2015  . Influenza,inj,Quad PF,6+ Mos 03/08/2014  . Influenza-Unspecified 12/22/2019  . PFIZER(Purple Top)SARS-COV-2 Vaccination 04/02/2019, 04/23/2019  . Pneumococcal Conjugate-13 11/16/2013  .  Pneumococcal Polysaccharide-23 12/12/2009  . Td 03/26/2003  . Tdap 06/15/2015   Patient reports his biggest concern right now is that his left hip has been giving him some grief and he has been getting injections every 6 weeks for some relief.   Conditions to be addressed/monitored:  Hypertension, GERD, BPH and Neuropathy  Care Plan : CCM Pharmacy Care Plan  Updates made by Viona Gilmore, [place] since 08/21/2020 12:00 AM    Problem: Problem: Hypertension, GERD, BPH and Neuropathy     Long-Range Goal: Patient-Specific Goal   Start Date: 08/03/2020  Expected End Date: 08/03/2021  This Visit's Progress: On track  Priority: High  Note:   Current Barriers:  . Unable to independently monitor  therapeutic efficacy  Pharmacist Clinical Goal(s):  Marland Kitchen Patient will achieve adherence to monitoring guidelines and medication adherence to achieve therapeutic efficacy through collaboration with PharmD and provider.   Interventions: . 1:1 collaboration with [name], MD regarding development and update of comprehensive plan of care as evidenced by provider attestation and co-signature . Inter-disciplinary care team collaboration (see longitudinal plan of care) . Comprehensive medication review performed; medication list updated in electronic medical record  Hypertension (BP goal <140/90) -Controlled -Current treatment:  Amlodipine 5 mg 1 tablet daily  Benazepril-HCTZ 10-12.5 mg 1 tablet daily -Medications previously tried: none  -Current home readings: hasn't been checking it at home ; used to have one but not sure where is it (arm cuff) -Current dietary habits: did not discuss -Current exercise habits: did not discuss -Denies hypotensive/hypertensive symptoms -Educated on Importance of home blood pressure monitoring; Proper BP monitoring technique; Symptoms of hypotension and importance of maintaining adequate hydration; -Counseled to monitor BP at home weekly, document, and  provide log at future appointments -Counseled on diet and exercise extensively Recommended to continue current medication  BPH (Goal: minimize symptoms of enlarged prostate) -Controlled -Current treatment  . Finasteride 5 mg 1 tablet daily -Medications previously tried: none  -Recommended to continue current medication  GERD (Goal: minimize symptoms of acid reflux/indigestion) -Controlled -Current treatment  . Famotidine 20 mg 1 tablet at bedtime -Medications previously tried: n/a  -Counseled on non-pharmacologic management of symptoms such as elevating the head of your bed, avoiding eating 2-3 hours before bed, avoiding triggering foods such as acidic, spicy, or fatty foods, eating smaller meals, and wearing clothes that are loose around the waist  Neuropathy (Goal: minimize pain) -Controlled -Current treatment  . Gabapentin 100 mg 1 capsule four times daily -Medications previously tried: none  -Counseled on possibility for increasing dose   Health Maintenance -Vaccine gaps: shingles (talked to Dr. Elease Hashimoto - he said to hold off) -Current therapy:   Miralax packet twice daily   Triamcinolone ointment 0.1%  Vitamin C 500 mg 1 tablet daily  Magnesium 400 mg daily  Vitamin D 1000 units daily  Tart cherry extract 1000 mg daily -Educated on Cost vs benefit of each product must be carefully weighed by individual consumer -Patient is satisfied with current therapy and denies issues -Recommended to continue current medication  Patient Goals/Self-Care Activities . Patient will:  - take medications as prescribed check blood pressure weekly, document, and provide at future appointments target a minimum of 150 minutes of moderate intensity exercise weekly  Follow Up Plan: Telephone follow up appointment with care management team member scheduled for: 6 months        Medication Assistance: None required.  Patient affirms current coverage meets needs.  Patient's  preferred pharmacy is:  [facility], [address] [place] [address] Phone: [phone] Fax: [phone]  [facility], [address] [place] [address] Phone: [phone] Fax: [phone]  Uses pill box? Yes - 2 weeks at a time Pt endorses 100% compliance  We discussed: Current pharmacy is preferred with insurance plan and patient is satisfied with pharmacy services Patient decided to: Continue current medication management strategy  Care Plan and Follow Up Patient Decision:  Patient agrees to Care Plan and Follow-up.  Plan: Telephone follow up appointment with care management team member scheduled for:  6 months  Jeni Salles, PharmD [place] Pharmacist [employer] [facility] [place] [phone]

## 2020-08-21 NOTE — Patient Instructions (Signed)
Visit Information  Goals Addressed   None    Patient Care Plan: CCM Pharmacy Care Plan    Problem Identified: Problem: Hypertension, GERD, BPH and Neuropathy     Long-Range Goal: Patient-Specific Goal   Start Date: 08/03/2020  Expected End Date: 08/03/2021  This Visit's Progress: On track  Priority: High  Note:   Current Barriers:  . Unable to independently monitor therapeutic efficacy  Pharmacist Clinical Goal(s):  Marland Kitchen Patient will achieve adherence to monitoring guidelines and medication adherence to achieve therapeutic efficacy through collaboration with PharmD and provider.   Interventions: . 1:1 collaboration with [name], MD regarding development and update of comprehensive plan of care as evidenced by provider attestation and co-signature . Inter-disciplinary care team collaboration (see longitudinal plan of care) . Comprehensive medication review performed; medication list updated in electronic medical record  Hypertension (BP goal <140/90) -Controlled -Current treatment:  Amlodipine 5 mg 1 tablet daily  Benazepril-HCTZ 10-12.5 mg 1 tablet daily -Medications previously tried: none  -Current home readings: hasn't been checking it at home ; used to have one but not sure where is it (arm cuff) -Current dietary habits: did not discuss -Current exercise habits: did not discuss -Denies hypotensive/hypertensive symptoms -Educated on Importance of home blood pressure monitoring; Proper BP monitoring technique; Symptoms of hypotension and importance of maintaining adequate hydration; -Counseled to monitor BP at home weekly, document, and provide log at future appointments -Counseled on diet and exercise extensively Recommended to continue current medication  BPH (Goal: minimize symptoms of enlarged prostate) -Controlled -Current treatment  . Finasteride 5 mg 1 tablet daily -Medications previously tried: none  -Recommended to continue current medication  GERD  (Goal: minimize symptoms of acid reflux/indigestion) -Controlled -Current treatment  . Famotidine 20 mg 1 tablet at bedtime -Medications previously tried: n/a  -Counseled on non-pharmacologic management of symptoms such as elevating the head of your bed, avoiding eating 2-3 hours before bed, avoiding triggering foods such as acidic, spicy, or fatty foods, eating smaller meals, and wearing clothes that are loose around the waist  Neuropathy (Goal: minimize pain) -Controlled -Current treatment  . Gabapentin 100 mg 1 capsule four times daily -Medications previously tried: none  -Counseled on possibility for increasing dose   Health Maintenance -Vaccine gaps: shingles (talked to Dr. Elease Hashimoto - he said to hold off) -Current therapy:   Miralax packet twice daily   Triamcinolone ointment 0.1%  Vitamin C 500 mg 1 tablet daily  Magnesium 400 mg daily  Vitamin D 1000 units daily  Tart cherry extract 1000 mg daily -Educated on Cost vs benefit of each product must be carefully weighed by individual consumer -Patient is satisfied with current therapy and denies issues -Recommended to continue current medication  Patient Goals/Self-Care Activities . Patient will:  - take medications as prescribed check blood pressure weekly, document, and provide at future appointments target a minimum of 150 minutes of moderate intensity exercise weekly  Follow Up Plan: Telephone follow up appointment with care management team member scheduled for: 6 months       Patient verbalizes understanding of instructions provided today and agrees to view in [place].  The pharmacy team will reach out to the patient again over the next 30 days.   Viona Gilmore, [facility]

## 2020-08-23 ENCOUNTER — Telehealth: Payer: Self-pay | Admitting: Pharmacist

## 2020-08-23 NOTE — Telephone Encounter (Signed)
Patient returned the phone call. Recommended for patient to trial 300 mg BID of gabapentin to see if this helps with his pain. Plan to follow up with him in 2-3 weeks to see if this has made a difference and a new prescription will be sent in at that time.  Patient was agreeable to the plan and is aware to call back with further questions or concerns.

## 2020-08-23 NOTE — Telephone Encounter (Signed)
Left voicemail for patient about trial of increasing gabapentin to assess if this will relieve some pain after discussion with PCP. Will try to reach patient later this week.

## 2020-09-13 ENCOUNTER — Telehealth: Payer: Self-pay | Admitting: Pharmacist

## 2020-09-13 NOTE — Chronic Care Management (AMB) (Addendum)
    Chronic Care Management Pharmacy Assistant   Name: Darin [name]  MRN: [id number] DOB: 10/10/1931  Reason for Encounter: General Adherence Call       Recent office visits:  None  Recent consult visits:  None  Hospital visits:  None in previous 6 months  Medications: Outpatient Encounter Medications as of 09/13/2020  Medication Sig   amLODipine (NORVASC) 5 MG tablet TAKE 1 TABLET DAILY   benazepril-hydrochlorthiazide (LOTENSIN HCT) 10-12.5 MG tablet TAKE 1 TABLET DAILY   famotidine (PEPCID) 20 MG tablet TAKE 1 TABLET AT BEDTIME   finasteride (PROSCAR) 5 MG tablet TAKE 1 TABLET EACH DAY.   gabapentin (NEURONTIN) 100 MG capsule TAKE 1 CAPSULE 4 TIMES     DAILY   magnesium oxide (MAG-OX) 400 MG tablet Take 400 mg by mouth daily.   polyethylene glycol (MIRALAX / GLYCOLAX) packet Take 17 g by mouth daily as needed for moderate constipation.   triamcinolone ointment (KENALOG) 0.1 % Apply 1 application topically 2 (two) times daily. To affected area   vitamin C (ASCORBIC ACID) 500 MG tablet Take 500 mg by mouth daily.   VITAMIN D PO Take by mouth.   No facility-administered encounter medications on file as of 09/13/2020.   I spoke with the patient about medication adherence. When he last spoke to the CPP there was an increase of his gabapentin from 400 mg daily to 600 mg daily. He states that he has not seen any change since increasing to this dose. He stated that he would like to go back to his former dose of 400 mg since there has not been any change. There have been no other changes to his medication since his last appointment. He states that he is not experiencing any side effects from his current medications. There have been no hospital visits emergency department or urgent care visits since his last CPP or PCP visit. He currently does not have any issues with his pharmacy. His next CCM appointment is scheduled for November 2022.   Star Rating Drugs: Medication Dispensed  Quantity  Pharmacy  Benazepril-HCT (LOTENSIN HCT) 10-12.5 MG tablet 05.24.2022 [address] Order    [place] ([place]) Mare Ferrari, [place] Pharmacist Assistant [phone]

## 2020-09-14 NOTE — Progress Notes (Signed)
Darin [name] [address] [place] Phone: [phone] Subjective:   I [name] am serving as a Education administrator for Dr. Hulan Saas.  This visit occurred during the SARS-CoV-2 public health emergency.  Safety protocols were in place, including screening questions prior to the visit, additional usage of staff PPE, and extensive cleaning of exam room while observing appropriate contact time as indicated for disinfecting solutions.   I'm seeing this patient by the request  of:  [name], MD  CC: Back pain, hip pain, knee pain and hand pain  OTL:[id number]  07/28/2020 Patient given injection today.  We did discuss would like to try to have the steroids frequency more likely 10 to 12 weeks.  Patient though is not a candidate for replacement.  Patient has tried the other treatment options including viscosupplementation and PRP with minimal improvement.  Patient will consider having this done if necessary but would like to continue with the steroid injections.  Warned of potential to frequently.  Patient will follow up with me again 6 to 8 weeks. Repeat injection given today.  Discussed continuing to use the walker.  We discussed also with the possibility of the lumbar epidural but patient would really like to avoid it.  Discussed icing regimen and home exercises.  Can repeat injection every 10 to 12 weeks if necessary.  Patient will follow up note in 6 to 8 weeks and see if patient is having more pain with the greater trochanteric area.  Update 09/15/2020 Darin [name] is a 87 y.o. male coming in with complaint of SI joint and R knee pain. Patient states the hip is sore. The right knee is hanging in there with the brace. Trigger finger left ring finger. Finger has been swollen and patient has a history of carpal tunnel and trigger finger on the left hand.  Patient is having worsening symptoms of the trigger finger at this time on the left hand.  This  seems to be the ring finger.  Getting caught daily.     Past Medical History:  Diagnosis Date   Arthritis    GERD (gastroesophageal reflux disease)    H/O hiatal hernia    "FOR YEARS" " NO PROBLEM"   Hyperlipidemia    Hypertension    Knee pain    Neuropathy    PERIPHERAL    Osteoporosis    PMR (polymyalgia rheumatica) (HCC)    Polymyalgia ([facility]) 5 YEARS   Past Surgical History:  Procedure Laterality Date   CARPAL TUNNEL RELEASE     CATARACT EXTRACTION     right   COLONOSCOPY  2007   INGUINAL HERNIA REPAIR  10/14/2011   Procedure: HERNIA REPAIR INGUINAL ADULT;  Surgeon: [name], MD;  Location: [facility];  Service: General;  Laterality: Right;  Right Inguinal Hernia Repair with Mesh   TONSILLECTOMY     VARICOSE VEIN SURGERY     Social History   Socioeconomic History   Marital status: Widowed    Spouse name: Not on file   Number of children: Not on file   Years of education: Not on file   Highest education level: Not on file  Occupational History   Occupation: retired    Fish farm manager: RETIRED  Tobacco Use   Smoking status: Never   Smokeless tobacco: Never  Vaping Use   Vaping Use: Never used  Substance and Sexual Activity   Alcohol use: Yes    Alcohol/week: 3.0 standard drinks  Types: 3 Glasses of wine per week    Comment: half glass wine before dinner   Drug use: No   Sexual activity: Yes  Other Topics Concern   Not on file  Social History Narrative   Not on file   Social Determinants of Health   Financial Resource Strain: Low Risk    Difficulty of Paying Living Expenses: Not hard at all  Food Insecurity: No Food Insecurity   Worried About Charity fundraiser in the Last Year: Never true   [place] in the Last Year: Never true  Transportation Needs: No Transportation Needs   Lack of Transportation (Medical): No   Lack of Transportation (Non-Medical): No  Physical Activity: Sufficiently Active   Days of Exercise per Week: 7 days   Minutes  of Exercise per Session: 30 min  Stress: No Stress Concern Present   Feeling of Stress : Not at all  Social Connections: Moderately Isolated   Frequency of Communication with Friends and Family: Twice a week   Frequency of Social Gatherings with Friends and Family: More than three times a week   Attends Religious Services: More than 4 times per year   Active Member of [employer] or Organizations: No   Attends Archivist Meetings: Never   Marital Status: Widowed   Allergies  Allergen Reactions   Statins Other (See Comments)    Muscle pain and upset stomach   Family History  Problem Relation Age of Onset   Heart disease Father    Coronary artery disease Other      Current Outpatient Medications (Cardiovascular):    amLODipine (NORVASC) 5 MG tablet, TAKE 1 TABLET DAILY   benazepril-hydrochlorthiazide (LOTENSIN HCT) 10-12.5 MG tablet, TAKE 1 TABLET DAILY     Current Outpatient Medications (Other):    famotidine (PEPCID) 20 MG tablet, TAKE 1 TABLET AT BEDTIME   finasteride (PROSCAR) 5 MG tablet, TAKE 1 TABLET EACH DAY.   gabapentin (NEURONTIN) 100 MG capsule, TAKE 1 CAPSULE 4 TIMES     DAILY   magnesium oxide (MAG-OX) 400 MG tablet, Take 400 mg by mouth daily.   polyethylene glycol (MIRALAX / GLYCOLAX) packet, Take 17 g by mouth daily as needed for moderate constipation.   triamcinolone ointment (KENALOG) 0.1 %, Apply 1 application topically 2 (two) times daily. To affected area   vitamin C (ASCORBIC ACID) 500 MG tablet, Take 500 mg by mouth daily.   VITAMIN D PO, Take by mouth.   Reviewed prior external information including notes and imaging from  primary care provider As well as notes that were available from care everywhere and other healthcare systems.  Past medical history, social, surgical and family history all reviewed in electronic medical record.  No pertanent information unless stated regarding to the chief complaint.   Review of Systems:  No headache,  visual changes, nausea, vomiting, diarrhea, constipation, dizziness, abdominal pain, skin rash, fevers, chills, night sweats, weight loss, swollen lymph nodes,, joint swelling, chest pain, shortness of breath, mood changes. POSITIVE muscle aches, body aches  Objective  Blood pressure 130/62, pulse 79, height 6\' 1"  (1.854 m), weight 201 lb (91.2 kg), SpO2 99 %.   General: No apparent distress alert and oriented x3 mood and affect normal, dressed appropriately.  HEENT: Pupils equal, extraocular movements intact  Respiratory: Patient's speak in full sentences and does not appear short of breath  Cardiovascular: Trace lower extremity edema, non tender, no erythema  Gait antalgic using a walker and a shuffling  gait Severe tenderness over the left greater trochanteric area.  Continues to have loss of lordosis of the back.  Limited range of motion of multiple joints secondary to arthritic changes and pain.  Patient also on the left hand does have a trigger nodule noted at the A2 pulley of the ring finger.  Severely tender to palpation.  Triggering noted today.  After verbal consent patient was prepped with alcohol swab and with a 25-gauge half inch needle injected into the flexor tendon sheath with a total of 0.5 cc of 0.5% Marcaine and 0.5 cc of Kenalog 40 mg/mL into the left ring finger.  No blood loss.  Band-Aid placed.  Postinjection instructions given  After verbal consent patient was prepped with alcohol swab and with a 21-gauge 2 inch needle injected with 2 cc of 0.5% Marcaine and 1 cc of Kenalog 40 mg/mL into the left greater trochanteric area.  No blood loss.  Band-Aid placed.  Postinjection instructions given   Impression and Recommendations:     The above documentation has been reviewed and is accurate and complete Lyndal Pulley, DO

## 2020-09-15 ENCOUNTER — Encounter: Payer: Self-pay | Admitting: Family Medicine

## 2020-09-15 ENCOUNTER — Other Ambulatory Visit: Payer: Self-pay

## 2020-09-15 ENCOUNTER — Ambulatory Visit (INDEPENDENT_AMBULATORY_CARE_PROVIDER_SITE_OTHER): Payer: Medicare Other | Admitting: Family Medicine

## 2020-09-15 DIAGNOSIS — M7062 Trochanteric bursitis, left hip: Secondary | ICD-10-CM | POA: Diagnosis not present

## 2020-09-15 DIAGNOSIS — M65342 Trigger finger, left ring finger: Secondary | ICD-10-CM | POA: Diagnosis not present

## 2020-09-15 NOTE — Patient Instructions (Addendum)
Good to see you Should get better in 2 weeks Heat and massage Wear the splint at night See me again in 6 weeks for possible SI joint and knee injections

## 2020-09-15 NOTE — Assessment & Plan Note (Signed)
Discussed about the long-term ramifications of the steroid injections.  Patient does states only thing that seems to be beneficial as well as with the sacroiliac joint.  Continues to try to be active.  Patient's we have discussed before about possible surgical intervention or advanced imaging anymore but at the moment patient feels like he would like to just keep these injections going.  Patient states that it is allowing him to do daily activities.  Follow-up with me again 6 weeks

## 2020-09-15 NOTE — Assessment & Plan Note (Signed)
Patient given injection and tolerated the procedure well.  Discussed icing regimen and home exercises.  Discussed avoiding certain activities as well as bracing at night.  Follow-up with me again in 6 weeks

## 2020-09-26 DIAGNOSIS — H2512 Age-related nuclear cataract, left eye: Secondary | ICD-10-CM | POA: Diagnosis not present

## 2020-09-26 DIAGNOSIS — H04123 Dry eye syndrome of bilateral lacrimal glands: Secondary | ICD-10-CM | POA: Diagnosis not present

## 2020-09-26 DIAGNOSIS — H35371 Puckering of macula, right eye: Secondary | ICD-10-CM | POA: Diagnosis not present

## 2020-09-26 DIAGNOSIS — H18413 Arcus senilis, bilateral: Secondary | ICD-10-CM | POA: Diagnosis not present

## 2020-09-26 DIAGNOSIS — Z961 Presence of intraocular lens: Secondary | ICD-10-CM | POA: Diagnosis not present

## 2020-10-14 ENCOUNTER — Other Ambulatory Visit: Payer: Self-pay | Admitting: Family Medicine

## 2020-10-19 ENCOUNTER — Telehealth: Payer: Self-pay

## 2020-10-19 NOTE — Telephone Encounter (Signed)
Patient left voicemail on concussion hotline that he needs to reschedule visit (non-concussion related). Spot on hold on 8/9 at 2:45pm for patient if this works.

## 2020-10-20 ENCOUNTER — Other Ambulatory Visit: Payer: Self-pay

## 2020-10-20 ENCOUNTER — Ambulatory Visit (INDEPENDENT_AMBULATORY_CARE_PROVIDER_SITE_OTHER): Payer: Medicare Other | Admitting: Podiatry

## 2020-10-20 ENCOUNTER — Encounter: Payer: Self-pay | Admitting: Podiatry

## 2020-10-20 DIAGNOSIS — M79675 Pain in left toe(s): Secondary | ICD-10-CM | POA: Diagnosis not present

## 2020-10-20 DIAGNOSIS — G609 Hereditary and idiopathic neuropathy, unspecified: Secondary | ICD-10-CM

## 2020-10-20 DIAGNOSIS — M79674 Pain in right toe(s): Secondary | ICD-10-CM | POA: Diagnosis not present

## 2020-10-20 DIAGNOSIS — B351 Tinea unguium: Secondary | ICD-10-CM | POA: Diagnosis not present

## 2020-10-20 NOTE — Progress Notes (Signed)
This patient returns to my office for at risk foot care.  This patient requires this care by a professional since this patient will be at risk due to having idiopathic neuropathy.  This patient is unable to cut nails himself since the patient cannot reach his nails.These nails are painful walking and wearing shoes.  This patient presents for at risk foot care today.  General Appearance  Alert, conversant and in no acute stress.  Vascular  Dorsalis pedis and posterior tibial  pulses are weaklabsent   palpable  bilaterally.  Capillary return is within normal limits  Bilaterally.Cold feet.  bilaterally.  Neurologic  Senn-Weinstein monofilament wire test within normal limits  bilaterally. Muscle power within normal limits bilaterally.  Nails Thick disfigured discolored nails with subungual debris  from hallux to fifth toes bilaterally. Pincer nails  B/L. No evidence of bacterial infection or drainage bilaterally.  Orthopedic  No limitations of motion  feet .  No crepitus or effusions noted.  No bony pathology or digital deformities noted.  Skin  normotropic skin with no porokeratosis noted bilaterally.  No signs of infections or ulcers noted.     Onychomycosis  Pain in right toes  Pain in left toes  Consent was obtained for treatment procedures.   Mechanical debridement of nails 1-5  bilaterally performed with a nail nipper.  Filed with dremel without incident.    Return office visit   3 months                  Told patient to return for periodic foot care and evaluation due to potential at risk complications.   Gardiner Barefoot DPM

## 2020-10-24 NOTE — Telephone Encounter (Signed)
Spoke with pt, rescheduled appt for 8/9 245.

## 2020-10-27 ENCOUNTER — Ambulatory Visit: Payer: Medicare Other | Admitting: Family Medicine

## 2020-10-31 ENCOUNTER — Other Ambulatory Visit: Payer: Self-pay

## 2020-10-31 ENCOUNTER — Encounter: Payer: Self-pay | Admitting: Family Medicine

## 2020-10-31 ENCOUNTER — Ambulatory Visit (INDEPENDENT_AMBULATORY_CARE_PROVIDER_SITE_OTHER): Payer: Medicare Other | Admitting: Family Medicine

## 2020-10-31 DIAGNOSIS — M19071 Primary osteoarthritis, right ankle and foot: Secondary | ICD-10-CM | POA: Diagnosis not present

## 2020-10-31 DIAGNOSIS — G8929 Other chronic pain: Secondary | ICD-10-CM | POA: Diagnosis not present

## 2020-10-31 DIAGNOSIS — M533 Sacrococcygeal disorders, not elsewhere classified: Secondary | ICD-10-CM

## 2020-10-31 NOTE — Progress Notes (Signed)
Darin Darin [name] Phone: [phone] Subjective:   Darin Darin [name], am serving as a scribe for Dr. Hulan [name]. This visit occurred during the SARS-CoV-2 public health emergency.  Safety protocols were in place, including screening questions prior to the visit, additional usage of staff PPE, and extensive cleaning of exam room while observing appropriate contact time as indicated for disinfecting solutions.   I'm seeing this patient by the request  of:  Darin Darin [name]  CC: Hip and back pain follow-up, knee pain follow-up  [id number]  Darin Darin [name] is a 87 y.o. male coming in with complaint of left lower back and hip pain.  Last exam was in June the patient did have a greater trochanteric injection on the left hip.  Patient has had some degenerative disc disease of the back but did not respond well to the epidural.  Seems to respond better to the greater trochanteric injections as well as the sacroiliac injection.  Patient states that he has had increase in L SI joint pain.   Patient also complaining of medial ankle pain and pain over metatarsal bones. Pain in foot with weight bearing.      Past Medical History:  Diagnosis Date   Arthritis    GERD (gastroesophageal reflux disease)    H/O hiatal hernia    "FOR YEARS" " Darin [name]"   Hyperlipidemia    Hypertension    Knee pain    Neuropathy    PERIPHERAL    Osteoporosis    PMR (polymyalgia rheumatica) (HCC)    Polymyalgia ([facility]) 5 YEARS   Past Surgical History:  Procedure Laterality Date   CARPAL TUNNEL RELEASE     CATARACT EXTRACTION     right   COLONOSCOPY  2007   INGUINAL HERNIA REPAIR  10/14/2011   Procedure: HERNIA REPAIR INGUINAL ADULT;  Surgeon: [name], [name];  Location: [facility];  Service: General;  Laterality: Right;  Right Inguinal Hernia Repair with Mesh   TONSILLECTOMY     VARICOSE VEIN SURGERY     Social History   Socioeconomic  History   Marital status: Widowed    Spouse name: Not on file   Number of children: Not on file   Years of education: Not on file   Highest education level: Not on file  Occupational History   Occupation: retired    Fish farm manager: RETIRED  Tobacco Use   Smoking status: Never   Smokeless tobacco: Never  Vaping Use   Vaping Use: Never used  Substance and Sexual Activity   Alcohol use: Yes    Alcohol/week: 3.0 standard drinks    Types: 3 Glasses of wine per week    Comment: half glass wine before dinner   Drug use: Darin [name]   Sexual activity: Yes  Other Topics Concern   Not on file  Social History Narrative   Not on file   Social Determinants of Health   Financial Resource Strain: Low Risk    Difficulty of Paying Living Expenses: Not hard at all  Food Insecurity: Darin [name] Food Insecurity   Worried About Charity fundraiser in the Last Year: Never true   Ran Out of Food in the Last Year: Never true  Transportation Needs: Darin [name] Transportation Needs   Lack of Transportation (Medical): Darin [name]   Lack of Transportation (Non-Medical): Darin [name]  Physical Activity: Sufficiently Active   Days of Exercise per Week: 7 days   Minutes of Exercise per Session:  30 min  Stress: Darin [name] Stress Concern Present   Feeling of Stress : Not at all  Social Connections: Moderately Isolated   Frequency of Communication with Friends and Family: Twice a week   Frequency of Social Gatherings with Friends and Family: More than three times a week   Attends Religious Services: More than 4 times per year   Active Member of [employer] or Organizations: Darin [name]   Attends Archivist Meetings: Never   Marital Status: Widowed   Allergies  Allergen Reactions   Statins Other (See Comments)    Muscle pain and upset stomach   Family History  Problem Relation Age of Onset   Heart disease Father    Coronary artery disease Other      Current Outpatient Medications (Cardiovascular):    amLODipine (NORVASC) 5 MG tablet, TAKE 1 TABLET  DAILY   benazepril-hydrochlorthiazide (LOTENSIN HCT) 10-12.5 MG tablet, TAKE 1 TABLET DAILY     Current Outpatient Medications (Other):    famotidine (PEPCID) 20 MG tablet, TAKE 1 TABLET AT BEDTIME   finasteride (PROSCAR) 5 MG tablet, TAKE 1 TABLET EACH DAY.   gabapentin (NEURONTIN) 100 MG capsule, TAKE 1 CAPSULE 4 TIMES     DAILY   magnesium oxide (MAG-OX) 400 MG tablet, Take 400 mg by mouth daily.   polyethylene glycol (MIRALAX / GLYCOLAX) packet, Take 17 g by mouth daily as needed for moderate constipation.   triamcinolone ointment (KENALOG) 0.1 %, Apply 1 application topically 2 (two) times daily. To affected area   vitamin C (ASCORBIC ACID) 500 MG tablet, Take 500 mg by mouth daily.   VITAMIN D PO, Take by mouth.   Reviewed prior external information including notes and imaging from  primary care provider As well as notes that were available from care everywhere and other healthcare systems.  Past medical history, social, surgical and family history all reviewed in electronic medical record.  Darin [name] unless stated regarding to the chief complaint.   Review of Systems:  Darin [name] headache, visual changes, nausea, vomiting, diarrhea, constipation, dizziness, abdominal pain, skin rash, fevers, chills, night sweats, weight loss, swollen lymph nodes, body aches, joint swelling, chest pain, shortness of breath, mood changes. POSITIVE muscle aches  Objective  Blood pressure 128/70, pulse 84, height '6\' 1"'$  (1.854 m), SpO2 98 %.   General: Darin [name] apparent distress alert and oriented x3 mood and affect normal, dressed appropriately.  HEENT: Pupils equal, extraocular movements intact  Respiratory: Patient's speak in full sentences and does not appear short of breath  Cardiovascular: 1+ lower extremity edema, tender, Darin [name] erythema patient is wearing compression stockings. Gait significant antalgic gait walking with the aid of a walker Right ankle exam does have arthritic changes noted.   Patient does have very mild weakness with dorsiflexion on the right side compared to the contralateral side. Low back exam shows the patient does have severe tenderness over the left sacroiliac joint.  Limited range of motion  After verbal consent patient was prepped with alcohol swab and with a 21-gauge 2 inch needle injected into the left sacroiliac joint.  A total of 1 cc of 0.5% Marcaine and 1 cc of Kenalog 40 mg/mL.  Darin [name] blood loss.  Band-Aid placed.  Postinjection instructions given.   Impression and Recommendations:     The above documentation has been reviewed and is accurate and complete Darin Pulley, DO

## 2020-10-31 NOTE — Patient Instructions (Addendum)
Good to see you Injected L SI joint today Go shoe shopping go to [email or web address]  Only one wear one orthotic in the shoe See me again in 6 weeks

## 2020-10-31 NOTE — Assessment & Plan Note (Signed)
Patient does have arthritis of the ankles bilaterally.  Discussed with patient about icing regimen.  We discussed potential x-rays which patient declined.  Patient will do the over-the-counter orthotics.  Patient is going to orthotics in his shoe initially encouraged him to only wear one.  Patient also has had significant wear of the shoes and encouraged him to get new shoes and see if this helps with some of the pain first before we do anything else more significant.

## 2020-10-31 NOTE — Assessment & Plan Note (Signed)
Chronic problem with exacerbation.  Still secondary to patient's other comorbidities and the area just seems to be returning there is available most of the moment with the lower EXTR risk.  He discussed with patient icing regimen discussed home exercises.  We discussed topical anti-inflammatories still could be something beneficial if necessary.  Follow-up with me again 6 weeks

## 2020-11-09 ENCOUNTER — Telehealth: Payer: Self-pay | Admitting: Pharmacist

## 2020-11-09 NOTE — Chronic Care Management (AMB) (Signed)
    Chronic Care Management Pharmacy Assistant   Name: Darin [name]  MRN: [id number] DOB: 09-Dec-1931  Reason for Encounter: General Assessment Call   Recent office visits:  06-20-2020 [name], MD - Patient presented for hypertension and other concerns. No medication changes.  Recent consult visits:  10-31-2020 Lyndal Pulley, DO - Patient presented for Chronic left SI joint pain and other concerns. No medication changes.  10-20-2020 Gardiner Barefoot, DPM - Patient presented for Pain due to onychomycosis of toenails of both feet and other concerns. No medication changes.  09-15-2020  Lyndal Pulley, DO - Patient presented for Trigger finger, left ring finger and other concerns. No medication changes.   Hospital visits:  None in previous 6 months  Medications: Outpatient Encounter Medications as of 11/09/2020  Medication Sig   amLODipine (NORVASC) 5 MG tablet TAKE 1 TABLET DAILY   benazepril-hydrochlorthiazide (LOTENSIN HCT) 10-12.5 MG tablet TAKE 1 TABLET DAILY   famotidine (PEPCID) 20 MG tablet TAKE 1 TABLET AT BEDTIME   finasteride (PROSCAR) 5 MG tablet TAKE 1 TABLET EACH DAY.   gabapentin (NEURONTIN) 100 MG capsule TAKE 1 CAPSULE 4 TIMES     DAILY   magnesium oxide (MAG-OX) 400 MG tablet Take 400 mg by mouth daily.   polyethylene glycol (MIRALAX / GLYCOLAX) packet Take 17 g by mouth daily as needed for moderate constipation.   triamcinolone ointment (KENALOG) 0.1 % Apply 1 application topically 2 (two) times daily. To affected area   vitamin C (ASCORBIC ACID) 500 MG tablet Take 500 mg by mouth daily.   VITAMIN D PO Take by mouth.   No facility-administered encounter medications on file as of 11/09/2020.  Notes: Spoke to patient he confirmed he has still been taking his Losentin and Norvasc. He notes he does not check his pressures at home but sees Sports medicine every 6 weeks or so and he is told those readings are good. He reports he is current on his fills for his  medications will need to pick up his Finasteride in 2 weeks. He reports he has been eating ok says he does not eat very much at his age now. He reports he has been using his walker to get around indoors. He reports he has not been to any urgent care's and has no medication changes with any providers. He reports no side effects from any medications. He was made aware of his next appointment with Clinical Pharmacist and that he will receive a reminder call. He reports no other questions or concerns at this time.  Care Gaps: CCM F/U Call - Scheduled 02-08-21 at 10  AWV - Scheduled for 06-26-2021 Zoster Vaccine - Overdue COVID Booster#3(Pfizer) - Overdue Flu Vaccine - Overdue  Star Rating Drugs: Benazepril-HCT (Losentin HCT) 10-12.5 mg - Last filled 08-15-2020 90 DS at [place] [facility] Pharmacist Assistant [phone]

## 2020-11-15 DIAGNOSIS — Z85828 Personal history of other malignant neoplasm of skin: Secondary | ICD-10-CM | POA: Diagnosis not present

## 2020-11-15 DIAGNOSIS — L738 Other specified follicular disorders: Secondary | ICD-10-CM | POA: Diagnosis not present

## 2020-11-19 ENCOUNTER — Other Ambulatory Visit: Payer: Self-pay | Admitting: Family Medicine

## 2020-11-20 ENCOUNTER — Ambulatory Visit: Payer: Self-pay

## 2020-11-20 ENCOUNTER — Encounter: Payer: Self-pay | Admitting: Family Medicine

## 2020-11-20 ENCOUNTER — Ambulatory Visit (INDEPENDENT_AMBULATORY_CARE_PROVIDER_SITE_OTHER): Payer: Medicare Other | Admitting: Family Medicine

## 2020-11-20 ENCOUNTER — Ambulatory Visit (INDEPENDENT_AMBULATORY_CARE_PROVIDER_SITE_OTHER): Payer: Medicare Other

## 2020-11-20 ENCOUNTER — Other Ambulatory Visit: Payer: Self-pay

## 2020-11-20 VITALS — BP 132/60 | HR 88 | Ht 73.0 in | Wt 194.8 lb

## 2020-11-20 DIAGNOSIS — M7989 Other specified soft tissue disorders: Secondary | ICD-10-CM | POA: Diagnosis not present

## 2020-11-20 DIAGNOSIS — M7671 Peroneal tendinitis, right leg: Secondary | ICD-10-CM | POA: Diagnosis not present

## 2020-11-20 DIAGNOSIS — R600 Localized edema: Secondary | ICD-10-CM | POA: Diagnosis not present

## 2020-11-20 DIAGNOSIS — M79671 Pain in right foot: Secondary | ICD-10-CM

## 2020-11-20 DIAGNOSIS — R609 Edema, unspecified: Secondary | ICD-10-CM | POA: Diagnosis not present

## 2020-11-20 NOTE — Progress Notes (Signed)
I, Wendy Poet, LAT, ATC, am serving as scribe for Dr. Lynne Leader.  Darin [name] is a 87 y.o. male who presents to [facility] today for R foot pain and swelling x .  He was last seen by Dr. Tamala [name] on 10/31/20 for L-sided LBP and L hip pain.  Today, pt reports R foot pain and swelling since over the weekend.  He locates his pain to his R lateral foot w/ swelling that is travelling up his lower leg.  Aggravating factors: R ankle inversion/eversion AROM Treatments tried: Aspirin   Pertinent review of systems: No fevers or chills  Relevant historical information: Poor gait requiring the use of a walker due to severe arthritis of both legs.  History of varicose veins.   Exam:  BP 132/60 (BP Location: Left Arm, Patient Position: Sitting, Cuff Size: Normal)   Pulse 88   Ht '6\' 1"'$  (1.854 m)   Wt 194 lb 12.8 oz (88.4 kg)   SpO2 100%   BMI 25.70 kg/m  General: Well Developed, well nourished, and in no acute distress.   MSK: Right leg 3+ pitting edema. Tender palpation along inferior and posterior lateral malleolus and ATFL region. The ankle motion is reduced with some pain with ankle motion. Intact strength. Capillary refill and pulses are intact distally.    Lab and Radiology Results  Diagnostic Limited MSK Ultrasound of: Right foot and ankle Peroneal tendons lateral ankle visualized.  Peroneal longus tendon at the distal ankle to midfoot with area of hypoechoic change within tendon sheath indicating tenosynovitis.  Tendon is intact. Significant edema present throughout ankle. Intact Achilles tendon and posterior tibialis tendon. Mild ankle effusion present and lateral ankle at ATFL area. Impression: Significant edema right ankle.  Probable peroneal tenosynovitis.   X-ray images right foot and ankle obtained today personally and independently interpreted  Right ankle: No acute fractures.  Mild ankle DJD.  Soft tissue swelling.  Right foot:  Moderate midfoot DJD.  Hammertoe deformities forefoot.  No acute fractures.  Soft tissue swelling.  Wait for radiology review   Assessment and Plan: 87 y.o. male with right lateral foot and ankle pain predominantly in to be due to peroneal tendinopathy.  Additionally patient has significant new onset unilateral leg swelling.  He does have some bilateral leg edema but his right leg is worse than left.  We will proceed to vascular ultrasound to rule out DVT.  We will treat presumed peroneal tendinopathy as main source of pain with physical therapy.  Patient has a follow-up appointment with Dr. Tamala [name] his primary sports medicine physician already scheduled for September 23.  At that point if not better possibly an injection may be in order.  Certainly he can return sooner if needed and I am happy to do an intra-articular steroid injection.   PDMP not reviewed this encounter. Orders Placed This Encounter  Procedures   [place] LIMITED JOINT SPACE STRUCTURES LOW RIGHT(NO LINKED CHARGES)    Order Specific Question:   Reason for Exam (SYMPTOM  OR DIAGNOSIS REQUIRED)    Answer:   R foot pain    Order Specific Question:   Preferred imaging location?    Answer:   [facility]   DG Foot Complete Right    Standing Status:   Future    Number of Occurrences:   1    Standing Expiration Date:   11/20/2021    Order Specific Question:   Reason for Exam (SYMPTOM  OR DIAGNOSIS  REQUIRED)    Answer:   eval foot pain    Order Specific Question:   Preferred imaging location?    Answer:   [facility]   DG Ankle Complete Right    Standing Status:   Future    Number of Occurrences:   1    Standing Expiration Date:   11/20/2021    Order Specific Question:   Reason for Exam (SYMPTOM  OR DIAGNOSIS REQUIRED)    Answer:   eval ankle pain lat    Order Specific Question:   Preferred imaging location?    Answer:   [facility]   Ambulatory referral to Physical Therapy    Referral  Priority:   Routine    Referral Type:   Physical Medicine    Referral Reason:   Specialty Services Required    Requested Specialty:   Physical Therapy    Number of Visits Requested:   1   No orders of the defined types were placed in this encounter.    Discussed warning signs or symptoms. Please see discharge instructions. Patient expresses understanding.   The above documentation has been reviewed and is accurate and complete Lynne Leader, M.D.

## 2020-11-20 NOTE — Patient Instructions (Addendum)
Thank you for coming in today.   Please get an Xray today before you leave   You should hear soon about the vascular ultrasound to look for DVT.   Continue compression stockings.   You are scheduled for follow up with Dr Tamala [name] on Sep 23th.   I think you will do will with some physical therapy for the tendon in your ankle.   We can see you sooner if needed.   Continue compression stocking.

## 2020-11-21 ENCOUNTER — Telehealth: Payer: Self-pay | Admitting: Physical Therapy

## 2020-11-21 ENCOUNTER — Telehealth: Payer: Self-pay | Admitting: Family Medicine

## 2020-11-21 ENCOUNTER — Ambulatory Visit (HOSPITAL_COMMUNITY)
Admission: RE | Admit: 2020-11-21 | Discharge: 2020-11-21 | Disposition: A | Discharge status: Home / Self Care | Payer: Medicare Other | Source: Ambulatory Visit | Attending: Cardiology | Admitting: Cardiology

## 2020-11-21 DIAGNOSIS — S82899A Other fracture of unspecified lower leg, initial encounter for closed fracture: Secondary | ICD-10-CM

## 2020-11-21 DIAGNOSIS — M79671 Pain in right foot: Secondary | ICD-10-CM | POA: Diagnosis not present

## 2020-11-21 DIAGNOSIS — M7989 Other specified soft tissue disorders: Secondary | ICD-10-CM | POA: Insufficient documentation

## 2020-11-21 DIAGNOSIS — S92901A Unspecified fracture of right foot, initial encounter for closed fracture: Secondary | ICD-10-CM

## 2020-11-21 NOTE — Progress Notes (Signed)
X-ray right ankle and foot show a possibly old but we cannot tell for sure fracture of the heel bone.  This probably does explain your pain and swelling.  I am going to get a CT scan of the foot which should show it more accurately.  For now recommend CAM Walker boot and trying to keep the weight off of it.  If you do not have a cam walker boot you can get one from either a medical supply company or from my office. Follow-up with myself or Dr. Tamala [name] or Dr. Glennon Mac after the CT scan results come back.  You should hear soon about scheduling the CT scan.

## 2020-11-21 NOTE — Progress Notes (Signed)
X-ray right foot shows a fracture of the heel bone as does the ankle x-ray.  CT scan of the foot has been ordered

## 2020-11-21 NOTE — Telephone Encounter (Signed)
CT scan of the foot ordered

## 2020-11-21 NOTE — Telephone Encounter (Signed)
Called pt to discuss his XR results and he asks if he still needs to get a venous doppler US.  Please advise and I will return pt's call.

## 2020-11-22 ENCOUNTER — Ambulatory Visit (HOSPITAL_BASED_OUTPATIENT_CLINIC_OR_DEPARTMENT_OTHER)
Admission: RE | Admit: 2020-11-22 | Discharge: 2020-11-22 | Disposition: A | Discharge status: Home / Self Care | Payer: Medicare Other | Source: Ambulatory Visit | Attending: Family Medicine | Admitting: Family Medicine

## 2020-11-22 ENCOUNTER — Other Ambulatory Visit: Payer: Self-pay

## 2020-11-22 DIAGNOSIS — S92901A Unspecified fracture of right foot, initial encounter for closed fracture: Secondary | ICD-10-CM | POA: Diagnosis not present

## 2020-11-22 DIAGNOSIS — M79671 Pain in right foot: Secondary | ICD-10-CM | POA: Diagnosis not present

## 2020-11-23 NOTE — Progress Notes (Signed)
DVT ultrasound test is negative

## 2020-11-24 NOTE — Progress Notes (Signed)
CT scan of the foot does not show an acute fracture.  This is great news.  But there is some concern for a stress fracture which would be better evaluated with an MRI.  I think we do not need to do an MRI right now.  Recommend try to keep the weight off the foot and treating for tendinitis like we discussed in clinic earlier this week.  If not improving would recommend proceeding to MRI.  If worsening before then certainly we can get an MRI as well.  You have a follow-up appointment scheduled with Dr. Tamala [name] in about 3 weeks.  If needed I know we can get you in sooner.

## 2020-11-25 DIAGNOSIS — L03119 Cellulitis of unspecified part of limb: Secondary | ICD-10-CM | POA: Diagnosis not present

## 2020-11-28 DIAGNOSIS — M79671 Pain in right foot: Secondary | ICD-10-CM | POA: Diagnosis not present

## 2020-12-03 DIAGNOSIS — M19071 Primary osteoarthritis, right ankle and foot: Secondary | ICD-10-CM | POA: Diagnosis not present

## 2020-12-14 NOTE — Progress Notes (Signed)
[facility] [place] [address] [place] Phone: [phone] Subjective:   [name], am serving as a scribe for Dr. Hulan Saas. This visit occurred during the SARS-CoV-2 public health emergency.  Safety protocols were in place, including screening questions prior to the visit, additional usage of staff PPE, and extensive cleaning of exam room while observing appropriate contact time as indicated for disinfecting solutions.   I'm seeing this patient by the request  of:  [name], MD  CC: Low back pain follow-up foot pain,  IRW:[id number]  10/31/2020 Patient does have arthritis of the ankles bilaterally.  Discussed with patient about icing regimen.  We discussed potential x-rays which patient declined.  Patient will do the over-the-counter orthotics.  Patient is going to orthotics in his shoe initially encouraged him to only wear one.  Patient also has had significant wear of the shoes and encouraged him to get new shoes and see if this helps with some of the pain first before we do anything else more significant.  Chronic problem with exacerbation.  Still secondary to patient's other comorbidities and the area just seems to be returning there is available most of the moment with the lower EXTR risk.  He discussed with patient icing regimen discussed home exercises.  We discussed topical anti-inflammatories still could be something beneficial if necessary.  Follow-up with me again 6 weeks   Update 12/15/2020 Malahki Gasaway is a 87 y.o. male coming in with complaint of R ankle and L SI joint pain. Patient states his overall pain remains about the same. This right foot pain has gotten worse.  R foot CT 11/22/2020 IMPRESSION: 1. Band-like area of sclerosis within the posterior calcaneus is suspicious for stress fracture. 2. No evidence of fracture involving the anterior process of the calcaneus, as questioned radiographically. 3. Diffuse  demineralization.       Past Medical History:  Diagnosis Date   Arthritis    GERD (gastroesophageal reflux disease)    H/O hiatal hernia    "FOR YEARS" " NO PROBLEM"   Hyperlipidemia    Hypertension    Knee pain    Neuropathy    PERIPHERAL    Osteoporosis    PMR (polymyalgia rheumatica) (HCC)    Polymyalgia ([place]) 5 YEARS   Past Surgical History:  Procedure Laterality Date   CARPAL TUNNEL RELEASE     CATARACT EXTRACTION     right   COLONOSCOPY  2007   INGUINAL HERNIA REPAIR  10/14/2011   Procedure: HERNIA REPAIR INGUINAL ADULT;  Surgeon: [name], MD;  Location: [facility];  Service: General;  Laterality: Right;  Right Inguinal Hernia Repair with Mesh   TONSILLECTOMY     VARICOSE VEIN SURGERY     Social History   Socioeconomic History   Marital status: Widowed    Spouse name: Not on file   Number of children: Not on file   Years of education: Not on file   Highest education level: Not on file  Occupational History   Occupation: retired    Fish farm manager: RETIRED  Tobacco Use   Smoking status: Never   Smokeless tobacco: Never  Vaping Use   Vaping Use: Never used  Substance and Sexual Activity   Alcohol use: Yes    Alcohol/week: 3.0 standard drinks    Types: 3 Glasses of wine per week    Comment: half glass wine before dinner   Drug use: No   Sexual activity: Yes  Other Topics Concern   Not on file  Social History Narrative   Not on file   Social Determinants of Health   Financial Resource Strain: Low Risk    Difficulty of Paying Living Expenses: Not hard at all  Food Insecurity: No Food Insecurity   Worried About Charity fundraiser in the Last Year: Never true   [place] in the Last Year: Never true  Transportation Needs: No Transportation Needs   Lack of Transportation (Medical): No   Lack of Transportation (Non-Medical): No  Physical Activity: Sufficiently Active   Days of Exercise per Week: 7 days   Minutes of Exercise per Session: 30 min   Stress: No Stress Concern Present   Feeling of Stress : Not at all  Social Connections: Moderately Isolated   Frequency of Communication with Friends and Family: Twice a week   Frequency of Social Gatherings with Friends and Family: More than three times a week   Attends Religious Services: More than 4 times per year   Active Member of [employer] or Organizations: No   Attends Archivist Meetings: Never   Marital Status: Widowed   Allergies  Allergen Reactions   Statins Other (See Comments)    Muscle pain and upset stomach   Family History  Problem Relation Age of Onset   Heart disease Father    Coronary artery disease Other      Current Outpatient Medications (Cardiovascular):    amLODipine (NORVASC) 5 MG tablet, TAKE 1 TABLET DAILY   benazepril-hydrochlorthiazide (LOTENSIN HCT) 10-12.5 MG tablet, TAKE 1 TABLET DAILY     Current Outpatient Medications (Other):    AMBULATORY NON FORMULARY MEDICATION, 1 Units by Other route once for 1 dose.   famotidine (PEPCID) 20 MG tablet, TAKE 1 TABLET AT BEDTIME   finasteride (PROSCAR) 5 MG tablet, TAKE 1 TABLET EACH DAY.   gabapentin (NEURONTIN) 100 MG capsule, TAKE 1 CAPSULE 4 TIMES     DAILY   magnesium oxide (MAG-OX) 400 MG tablet, Take 400 mg by mouth daily.   polyethylene glycol (MIRALAX / GLYCOLAX) packet, Take 17 g by mouth daily as needed for moderate constipation.   triamcinolone ointment (KENALOG) 0.1 %, Apply 1 application topically 2 (two) times daily. To affected area   vitamin C (ASCORBIC ACID) 500 MG tablet, Take 500 mg by mouth daily.   VITAMIN D PO, Take by mouth.   Reviewed prior external information including notes and imaging from  primary care provider As well as notes that were available from care everywhere and other healthcare systems.  Past medical history, social, surgical and family history all reviewed in electronic medical record.  No pertanent information unless stated regarding to the chief  complaint.   Review of Systems:  No headache, visual changes, nausea, vomiting, diarrhea, constipation, dizziness, abdominal pain, skin rash, fevers, chills, night sweats, weight loss, swollen lymph nodes,  chest pain, shortness of breath, mood changes. POSITIVE muscle aches, body aches, joint swelling  Objective  Blood pressure (!) 126/50, pulse 81, height 6\' 1"  (1.854 m), weight 195 lb (88.5 kg), SpO2 98 %.   General: No apparent distress alert and oriented x3 mood and affect normal, dressed appropriately.  HEENT: Pupils equal, extraocular movements intact  Respiratory: Patient's speak in full sentences and does not appear short of breath  Cardiovascular: 3+ lower extremity edema right greater than left, mild tender, no erythema  Gait severely antalgic walking with the aid of a walker MSK: Patient's right foot  shows the patient does have edema noted.  Patient is nontender on exam no.  No pain over the calcaneal area.  More pain noted over the left greater trochanteric area.  Mild pain over the piriformis and mild pain over the sacroiliac joint.  Loss of lordosis of the back.  Negative straight leg test but difficulty with Corky Sox secondary to the tightness patient is having pain on the lateral aspect of the hip  After verbal consent patient was prepped with alcohol swab and with a 21-gauge 2 inch needle injected into the left greater trochanteric area with a total of 2 cc of 0.5% Marcaine and 1 cc of Kenalog 40 mg/mL no blood loss.  Postinjection instructions given.    Impression and Recommendations:     The above documentation has been reviewed and is accurate and complete Lyndal Pulley, DO

## 2020-12-15 ENCOUNTER — Other Ambulatory Visit: Payer: Self-pay

## 2020-12-15 ENCOUNTER — Encounter: Payer: Self-pay | Admitting: Family Medicine

## 2020-12-15 ENCOUNTER — Ambulatory Visit (INDEPENDENT_AMBULATORY_CARE_PROVIDER_SITE_OTHER): Payer: Medicare Other | Admitting: Family Medicine

## 2020-12-15 DIAGNOSIS — M7062 Trochanteric bursitis, left hip: Secondary | ICD-10-CM | POA: Diagnosis not present

## 2020-12-15 DIAGNOSIS — M19071 Primary osteoarthritis, right ankle and foot: Secondary | ICD-10-CM | POA: Diagnosis not present

## 2020-12-15 MED ORDER — AMBULATORY NON FORMULARY MEDICATION: 1.0000 [IU] | Freq: Once | 0 refills | Status: AC

## 2020-12-15 NOTE — Patient Instructions (Addendum)
Pneumatic compression device from [facility] Injected side of hip Whenever seated try get feet above heart to reduce swelling Glad ankles doing better See you again in 6 weeks

## 2020-12-15 NOTE — Assessment & Plan Note (Signed)
Repeat injection given today and tolerated the procedure well.  Discussed icing regimen and home exercises, discussed avoiding certain activities.  Follow-up again in 6 to 8 weeks

## 2020-12-15 NOTE — Assessment & Plan Note (Signed)
Patient does have arthritis of the right ankle and does have what appears to be a healing stress reaction of the calcaneal area.  Discussed with patient about different treatment options.  Patient is a fall risk and I do not recommend the cam walker.  Patient also due to his other ailments does not feel comfortable doing physical therapy.  Patient states that the pain is better overall and encouraged him to continue to monitor.  If worsening symptoms we will consider possibly orthotics or may be even a rolling walker which patient does not want at the moment.  Patient will follow up with me again in 2 months.  Discussed with patient though that patient does have some edema and possibly pneumatic compression would be helpful and wrote a prescription for him to check at a medical supply store.

## 2020-12-18 ENCOUNTER — Telehealth: Payer: Self-pay | Admitting: Pharmacist

## 2020-12-18 NOTE — Chronic Care Management (AMB) (Signed)
    Chronic Care Management Pharmacy Assistant   Name: Darin [name]  MRN: [id number] DOB: 01/02/1932  Reason for Encounter: Reschedule Appointment with Jeni Salles as she will be at a different office on Thursdays per Thurmond Butts PTM.    Call to patient did not reach left voicemail of the above and that the appointment for 02-08-21 had been rescheduled for 04-04-21 and to return call to me if that is not a good day or time.  Medications: Outpatient Encounter Medications as of 12/18/2020  Medication Sig   amLODipine (NORVASC) 5 MG tablet TAKE 1 TABLET DAILY   benazepril-hydrochlorthiazide (LOTENSIN HCT) 10-12.5 MG tablet TAKE 1 TABLET DAILY   famotidine (PEPCID) 20 MG tablet TAKE 1 TABLET AT BEDTIME   finasteride (PROSCAR) 5 MG tablet TAKE 1 TABLET EACH DAY.   gabapentin (NEURONTIN) 100 MG capsule TAKE 1 CAPSULE 4 TIMES     DAILY   magnesium oxide (MAG-OX) 400 MG tablet Take 400 mg by mouth daily.   polyethylene glycol (MIRALAX / GLYCOLAX) packet Take 17 g by mouth daily as needed for moderate constipation.   triamcinolone ointment (KENALOG) 0.1 % Apply 1 application topically 2 (two) times daily. To affected area   vitamin C (ASCORBIC ACID) 500 MG tablet Take 500 mg by mouth daily.   VITAMIN D PO Take by mouth.   No facility-administered encounter medications on file as of 12/18/2020.  Care Gaps: CCM F/U Call - Scheduled 04-04-21 AWV - Scheduled for 06-26-2021 Zoster Vaccine - Overdue COVID Booster#3(Pfizer) - Overdue Flu Vaccine - Overdue   Star Rating Drugs: Benazepril-HCT (Losentin HCT) 10-12.5 mg - Last filled 11-25-2020 90 DS at [place] [facility] Pharmacist Assistant [phone]

## 2020-12-29 ENCOUNTER — Telehealth: Payer: Self-pay | Admitting: Pharmacist

## 2020-12-29 NOTE — Chronic Care Management (AMB) (Signed)
Chronic Care Management Pharmacy Assistant   Name: Darin [name]  MRN: [id number] DOB: [birth year]   Reason for Encounter: General Assessment Call    Conditions to be addressed/monitored: HTN  Recent office visits:  None  Recent consult visits:  12-15-2020 Darin Pulley, DO (Sports Med) - Patient presented for Arthritis of right ankle and other concerns. No medication changes.  11-22-2020 Darin Hams, MD  (Sports Med) - Patient presented to [facility] for CT of right foot without contrast. IMPRESSION:  Band-like area of sclerosis within the posterior calcaneus is suspicious for stress fracture.  No evidence of fracture involving the anterior process of the calcaneus, as questioned radiographically. Diffuse demineralization.   11-21-2020 Darin Perla, MD (Cardiology) - Patient presented to [facility] for LE Venous DVT.  11-20-2020 Darin Hams, MD  (Sports Med) - Patient presented for Peroneal tendinitis and other concerns. No medication changes.    Hospital visits:  None in previous 6 months  Medications: Outpatient Encounter Medications as of 12/29/2020  Medication Sig   amLODipine (NORVASC) 5 MG tablet TAKE 1 TABLET DAILY   benazepril-hydrochlorthiazide (LOTENSIN HCT) 10-12.5 MG tablet TAKE 1 TABLET DAILY   famotidine (PEPCID) 20 MG tablet TAKE 1 TABLET AT BEDTIME   finasteride (PROSCAR) 5 MG tablet TAKE 1 TABLET EACH DAY.   gabapentin (NEURONTIN) 100 MG capsule TAKE 1 CAPSULE 4 TIMES     DAILY   magnesium oxide (MAG-OX) 400 MG tablet Take 400 mg by mouth daily.   polyethylene glycol (MIRALAX / GLYCOLAX) packet Take 17 g by mouth daily as needed for moderate constipation.   triamcinolone ointment (KENALOG) 0.1 % Apply 1 application topically 2 (two) times daily. To affected area   vitamin C (ASCORBIC ACID) 500 MG tablet Take 500 mg by mouth daily.   VITAMIN D PO Take by mouth.   No facility-administered encounter medications  on file as of 12/29/2020.   Reviewed chart prior to disease state call. Spoke with patient regarding BP  Recent Office Vitals: BP Readings from Last 3 Encounters:  12/15/20 (!) 126/50  11/20/20 132/60  10/31/20 128/70   Pulse Readings from Last 3 Encounters:  12/15/20 81  11/20/20 88  10/31/20 84    Wt Readings from Last 3 Encounters:  12/15/20 195 lb (88.5 kg)  11/20/20 194 lb 12.8 oz (88.4 kg)  09/15/20 201 lb (91.2 kg)     Kidney Function Lab Results  Component Value Date/Time   CREATININE 0.73 02/25/2020 09:00 AM   CREATININE 0.77 04/08/2018 08:46 AM   CREATININE 0.67 04/02/2017 11:56 AM   GFR 102.17 04/08/2018 08:46 AM   GFRNONAA >60 03/16/2017 05:23 AM   GFRAA >60 03/16/2017 05:23 AM    BMP Latest Ref Rng & Units 02/25/2020 04/08/2018 04/02/2017  Glucose 65 - 99 mg/dL 78 60(L) 101(H)  BUN 7 - 25 mg/dL 16 19 19   Creatinine 0.70 - 1.11 mg/dL 0.73 0.77 0.67  BUN/Creat Ratio 6 - 22 (calc) NOT APPLICABLE - -  Sodium 938 - 146 mmol/L 139 137 136  Potassium 3.5 - 5.3 mmol/L 4.1 4.0 4.0  Chloride 98 - 110 mmol/L 100 99 98  CO2 20 - 32 mmol/L 29 34(H) 31  Calcium 8.6 - 10.3 mg/dL 9.8 9.7 9.1    Current antihypertensive regimen:  Amlodipine 5 mg 1 tablet daily Benazepril-HCTZ 10-12.5 mg 1 tablet daily How often are you checking your Blood Pressure? Patient does not check blood pressures at home Current home  BP readings: Patient does not check at home last office was 126/50 What recent interventions/DTPs have been made by any provider to improve Blood Pressure control since last CPP Visit: Patient reports not for his blood pressure but for the leg swelling he was prescribed compression hose. Any recent hospitalizations or ED visits since last visit with CPP? None  What diet changes have been made to improve Blood Pressure Control?  Patient report he has still been eating well, but not very much that is usual for him and his age. What exercise is being done to improve your  Blood Pressure Control?  Patient reports he is still using his walker indoors and using his compression hose during the day, he reports the leg size has decreased significantly but not completely although he says they do not hurt him.  Adherence Review: Is the patient currently on ACE/ARB medication? Yes Does the patient have >5 day gap between last estimated fill dates? No  Advised patient that he will be due for flu shot in a few weeks at the office and follow up with PCP at the beginning of the year and ill check back in with him prior and he was in agreement.  Care Gaps: CCM F/U Call - Scheduled 04-06-21 AWV - Scheduled for 06-26-2021 Zoster Vaccine - Overdue COVID Booster#3(Pfizer) - Overdue Flu Vaccine - 01-09-21 BP - 126/50   Star Rating Drugs: Benazepril-HCT (Losentin HCT) 10-12.5 mg - Last filled 11-25-2020 90 DS at [place] Pharmacist Assistant [phone]

## 2021-01-04 DIAGNOSIS — Z23 Encounter for immunization: Secondary | ICD-10-CM | POA: Diagnosis not present

## 2021-01-05 ENCOUNTER — Other Ambulatory Visit: Payer: Self-pay | Admitting: Family Medicine

## 2021-01-05 DIAGNOSIS — I10 Essential (primary) hypertension: Secondary | ICD-10-CM

## 2021-01-09 ENCOUNTER — Ambulatory Visit: Payer: Medicare Other

## 2021-01-10 ENCOUNTER — Other Ambulatory Visit: Payer: Self-pay

## 2021-01-10 ENCOUNTER — Ambulatory Visit (INDEPENDENT_AMBULATORY_CARE_PROVIDER_SITE_OTHER): Payer: Medicare Other | Admitting: Family Medicine

## 2021-01-10 VITALS — BP 142/80 | HR 80 | Temp 97.7°F | Wt 191.8 lb

## 2021-01-10 DIAGNOSIS — R55 Syncope and collapse: Secondary | ICD-10-CM | POA: Diagnosis not present

## 2021-01-10 DIAGNOSIS — I10 Essential (primary) hypertension: Secondary | ICD-10-CM | POA: Diagnosis not present

## 2021-01-10 DIAGNOSIS — Z23 Encounter for immunization: Secondary | ICD-10-CM

## 2021-01-10 DIAGNOSIS — H6121 Impacted cerumen, right ear: Secondary | ICD-10-CM

## 2021-01-10 NOTE — Progress Notes (Signed)
Established Patient Office Visit  Subjective:  Patient ID: Darin [name], male    DOB: [birth year]  Age: 87 y.o. MRN: [id number]  CC:  Chief Complaint  Patient presents with   Ear Fullness    HPI [name] presents for ear fullness right greater than left.  Has had issues with cerumen impaction in the past.  No ear pain.  Occasional drainage from the left ear.  He also relates an episode this past Monday where he apparently collapsed when he was in his [employer].  He felt well that morning.  He had breakfast around 730 and this episode occurred around 10:30 AM.  He was getting up and walking down the hallway when he became dizzy and apparently went out briefly.  EMS was called.  He states his CBG was normal and EKG was unremarkable.  Blood pressures were stable.  No seizure activity.  No head injury.  No confusion.  No urine or stool incontinence.  They suspected he had some dehydration.  He was not taken in for further evaluation.  Since then he has increased his hydration and has had no further episodes  He does take amlodipine and benazepril HCTZ for hypertension.  Is not describing any orthostatic type symptoms.  No recent chest pains.  No palpitations.  Past Medical History:  Diagnosis Date   Arthritis    GERD (gastroesophageal reflux disease)    H/O hiatal hernia    "FOR YEARS" " NO PROBLEM"   Hyperlipidemia    Hypertension    Knee pain    Neuropathy    PERIPHERAL    Osteoporosis    PMR (polymyalgia rheumatica) (HCC)    Polymyalgia ([place]) 5 YEARS    Past Surgical History:  Procedure Laterality Date   CARPAL TUNNEL RELEASE     CATARACT EXTRACTION     right   COLONOSCOPY  2007   INGUINAL HERNIA REPAIR  10/14/2011   Procedure: HERNIA REPAIR INGUINAL ADULT;  Surgeon: [name], MD;  Location: [facility];  Service: General;  Laterality: Right;  Right Inguinal Hernia Repair with Mesh   TONSILLECTOMY     VARICOSE VEIN SURGERY      Family History  Problem Relation  Age of Onset   Heart disease Father    Coronary artery disease Other     Social History   Socioeconomic History   Marital status: Widowed    Spouse name: Not on file   Number of children: Not on file   Years of education: Not on file   Highest education level: Not on file  Occupational History   Occupation: retired    Fish farm manager: RETIRED  Tobacco Use   Smoking status: Never   Smokeless tobacco: Never  Vaping Use   Vaping Use: Never used  Substance and Sexual Activity   Alcohol use: Yes    Alcohol/week: 3.0 standard drinks    Types: 3 Glasses of wine per week    Comment: half glass wine before dinner   Drug use: No   Sexual activity: Yes  Other Topics Concern   Not on file  Social History Narrative   Not on file   Social Determinants of Health   Financial Resource Strain: Low Risk    Difficulty of Paying Living Expenses: Not hard at all  Food Insecurity: No Food Insecurity   Worried About Charity fundraiser in the Last Year: Never true   Ran Out of Food in the Last Year: Never true  Transportation Needs:  No Transportation Needs   Lack of Transportation (Medical): No   Lack of Transportation (Non-Medical): No  Physical Activity: Sufficiently Active   Days of Exercise per Week: 7 days   Minutes of Exercise per Session: 30 min  Stress: No Stress Concern Present   Feeling of Stress : Not at all  Social Connections: Moderately Isolated   Frequency of Communication with Friends and Family: Twice a week   Frequency of Social Gatherings with Friends and Family: More than three times a week   Attends Religious Services: More than 4 times per year   Active Member of [employer] or Organizations: No   Attends Archivist Meetings: Never   Marital Status: Widowed  Human resources officer Violence: Not At Risk   Fear of Current or Ex-Partner: No   Emotionally Abused: No   Physically Abused: No   Sexually Abused: No    Outpatient Medications Prior to Visit  Medication Sig  Dispense Refill   amLODipine (NORVASC) 5 MG tablet TAKE 1 TABLET DAILY 90 tablet 3   benazepril-hydrochlorthiazide (LOTENSIN HCT) 10-12.5 MG tablet TAKE 1 TABLET DAILY 90 tablet 1   famotidine (PEPCID) 20 MG tablet TAKE 1 TABLET AT BEDTIME 90 tablet 1   finasteride (PROSCAR) 5 MG tablet TAKE 1 TABLET EACH DAY. 90 tablet 3   gabapentin (NEURONTIN) 100 MG capsule TAKE 1 CAPSULE 4 TIMES     DAILY 360 capsule 3   magnesium oxide (MAG-OX) 400 MG tablet Take 400 mg by mouth daily.     polyethylene glycol (MIRALAX / GLYCOLAX) packet Take 17 g by mouth daily as needed for moderate constipation.     triamcinolone ointment (KENALOG) 0.1 % Apply 1 application topically 2 (two) times daily. To affected area 30 g 1   vitamin C (ASCORBIC ACID) 500 MG tablet Take 500 mg by mouth daily.     VITAMIN D PO Take by mouth.     No facility-administered medications prior to visit.    Allergies  Allergen Reactions   Statins Other (See Comments)    Muscle pain and upset stomach    ROS Review of Systems  Constitutional:  Negative for fatigue.  Eyes:  Negative for visual disturbance.  Respiratory:  Negative for cough, chest tightness and shortness of breath.   Cardiovascular:  Negative for chest pain, palpitations and leg swelling.  Neurological:  Positive for syncope. Negative for dizziness, weakness, light-headedness and headaches.     Objective:    Physical Exam Constitutional:      Appearance: He is well-developed.  HENT:     Ears:     Comments: Cerumen impaction right canal.  Left canal and eardrum are normal. Eyes:     Pupils: Pupils are equal, round, and reactive to light.  Neck:     Thyroid: No thyromegaly.  Cardiovascular:     Rate and Rhythm: Normal rate and regular rhythm.  Pulmonary:     Effort: Pulmonary effort is normal. No respiratory distress.     Breath sounds: Normal breath sounds. No wheezing or rales.  Musculoskeletal:     Cervical back: Neck supple.     Right lower leg: No  edema.     Left lower leg: No edema.  Neurological:     Mental Status: He is alert and oriented to person, place, and time.    BP (!) 142/80 (BP Location: Left Arm, Cuff Size: Normal)   Pulse 80   Temp 97.7 F (36.5 C) (Oral)   Wt 191 lb 12.8  oz (87 kg)   SpO2 98%   BMI 25.30 kg/m  Wt Readings from Last 3 Encounters:  01/10/21 191 lb 12.8 oz (87 kg)  12/15/20 195 lb (88.5 kg)  11/20/20 194 lb 12.8 oz (88.4 kg)     Health Maintenance Due  Topic Date Due   Zoster Vaccines- Shingrix (1 of 2) Never done   COVID-19 Vaccine (3 - Pfizer risk series) 05/21/2019    There are no preventive care reminders to display for this patient.  Lab Results  Component Value Date   TSH 2.471 03/29/2012   Lab Results  Component Value Date   WBC 6.7 05/02/2017   HGB 11.8 (L) 05/02/2017   HCT 35.8 (L) 05/02/2017   MCV 90.9 05/02/2017   PLT 265 05/02/2017   Lab Results  Component Value Date   NA 139 02/25/2020   K 4.1 02/25/2020   CO2 29 02/25/2020   GLUCOSE 78 02/25/2020   BUN 16 02/25/2020   CREATININE 0.73 02/25/2020   BILITOT 0.6 03/12/2017   ALKPHOS 52 03/12/2017   AST 19 03/12/2017   ALT 15 (L) 03/12/2017   PROT 6.1 (L) 03/12/2017   ALBUMIN 3.1 (L) 03/12/2017   CALCIUM 9.8 02/25/2020   ANIONGAP 6 03/16/2017   GFR 102.17 04/08/2018   Lab Results  Component Value Date   CHOL 228 (H) 03/30/2012   Lab Results  Component Value Date   HDL 100 03/30/2012   Lab Results  Component Value Date   LDLCALC 116 (H) 03/30/2012   Lab Results  Component Value Date   TRIG 61 03/30/2012   Lab Results  Component Value Date   CHOLHDL 2.3 03/30/2012   No results found for: HGBA1C    Assessment & Plan:   #1 cerumen impaction right canal.  Patient aware of risk of irrigation including risk of bleeding, pain, very low risk of perforation.  He consents.  Irrigation per nurse.  She was able to remove out majority of wax.  He had 1 residual piece which we are able to remove with  curette and he tolerated well.  Eardrum appears normal.  Minimal erythema of canal  #2 recent syncope/collapse.  Evaluated by EMS.  Felt to be dehydrated.  No episodes since then.  Recommend further evaluation if he has any recurrent episodes.  No evidence for any orthostatic changes today.  Standing blood pressure 142/62  #3 hypertension stable   No orders of the defined types were placed in this encounter.   Follow-up: No follow-ups on file.    [name], MD

## 2021-01-26 ENCOUNTER — Ambulatory Visit (INDEPENDENT_AMBULATORY_CARE_PROVIDER_SITE_OTHER): Payer: Medicare Other | Admitting: Podiatry

## 2021-01-26 ENCOUNTER — Encounter: Payer: Self-pay | Admitting: Podiatry

## 2021-01-26 ENCOUNTER — Other Ambulatory Visit: Payer: Self-pay

## 2021-01-26 DIAGNOSIS — M79675 Pain in left toe(s): Secondary | ICD-10-CM

## 2021-01-26 DIAGNOSIS — M79674 Pain in right toe(s): Secondary | ICD-10-CM

## 2021-01-26 DIAGNOSIS — G609 Hereditary and idiopathic neuropathy, unspecified: Secondary | ICD-10-CM | POA: Diagnosis not present

## 2021-01-26 DIAGNOSIS — B351 Tinea unguium: Secondary | ICD-10-CM

## 2021-01-26 NOTE — Progress Notes (Signed)
This patient returns to my office for at risk foot care.  This patient requires this care by a professional since this patient will be at risk due to having idiopathic neuropathy.  This patient is unable to cut nails himself since the patient cannot reach his nails.These nails are painful walking and wearing shoes.  This patient presents for at risk foot care today.  General Appearance  Alert, conversant and in no acute stress.  Vascular  Dorsalis pedis and posterior tibial  pulses are weaklabsent   palpable  bilaterally.  Capillary return is within normal limits  Bilaterally.Cold feet.  bilaterally.  Neurologic  Senn-Weinstein monofilament wire test within normal limits  bilaterally. Muscle power within normal limits bilaterally.  Nails Thick disfigured discolored nails with subungual debris  from hallux to fifth toes bilaterally. Pincer nails  B/L. No evidence of bacterial infection or drainage bilaterally.  Orthopedic  No limitations of motion  feet .  No crepitus or effusions noted.  No bony pathology or digital deformities noted.  Skin  normotropic skin with no porokeratosis noted bilaterally.  No signs of infections or ulcers noted.     Onychomycosis  Pain in right toes  Pain in left toes  Consent was obtained for treatment procedures.   Mechanical debridement of nails 1-5  bilaterally performed with a nail nipper.  Filed with dremel without incident.    Return office visit   3 months                  Told patient to return for periodic foot care and evaluation due to potential at risk complications.   Gardiner Barefoot DPM

## 2021-01-29 NOTE — Progress Notes (Signed)
[facility] [place] [address] [place] Phone: [phone] Subjective:   [name], am serving as a scribe for Dr. Hulan Saas. This visit occurred during the SARS-CoV-2 public health emergency.  Safety protocols were in place, including screening questions prior to the visit, additional usage of staff PPE, and extensive cleaning of exam room while observing appropriate contact time as indicated for disinfecting solutions.   I'm seeing this patient by the request  of:  [name], MD  CC: Knee pain, back pain, finger pain  [id number]  12/15/2020 Repeat injection given today and tolerated the procedure well.  Discussed icing regimen and home exercises, discussed avoiding certain activities.  Follow-up again in 6 to 8 weeks  Patient does have arthritis of the right ankle and does have what appears to be a healing stress reaction of the calcaneal area.  Discussed with patient about different treatment options.  Patient is a fall risk and I do not recommend the cam walker.  Patient also due to his other ailments does not feel comfortable doing physical therapy.  Patient states that the pain is better overall and encouraged him to continue to monitor.  If worsening symptoms we will consider possibly orthotics or may be even a rolling walker which patient does not want at the moment.  Patient will follow up with me again in 2 months.  Discussed with patient though that patient does have some edema and possibly pneumatic compression would be helpful and wrote a prescription for him to check at a medical supply store.  Update 01/30/2021 Redmond Whittley is a 87 y.o. male coming in with complaint of R ankle and L hip pain. Patient states nothing is getting better. Knee is in the most pain today, followed by trigger finger, hip, and ankle. Swelling has gone down in his foot.  Patient has had injections previously and does feel like the injections can  be helpful.       Past Medical History:  Diagnosis Date   Arthritis    GERD (gastroesophageal reflux disease)    H/O hiatal hernia    "FOR YEARS" " NO PROBLEM"   Hyperlipidemia    Hypertension    Knee pain    Neuropathy    PERIPHERAL    Osteoporosis    PMR (polymyalgia rheumatica) (HCC)    Polymyalgia ([place]) 5 YEARS   Past Surgical History:  Procedure Laterality Date   CARPAL TUNNEL RELEASE     CATARACT EXTRACTION     right   COLONOSCOPY  2007   INGUINAL HERNIA REPAIR  10/14/2011   Procedure: HERNIA REPAIR INGUINAL ADULT;  Surgeon: [name], MD;  Location: [facility];  Service: General;  Laterality: Right;  Right Inguinal Hernia Repair with Mesh   TONSILLECTOMY     VARICOSE VEIN SURGERY     Social History   Socioeconomic History   Marital status: Widowed    Spouse name: Not on file   Number of children: Not on file   Years of education: Not on file   Highest education level: Not on file  Occupational History   Occupation: retired    Fish farm manager: RETIRED  Tobacco Use   Smoking status: Never   Smokeless tobacco: Never  Vaping Use   Vaping Use: Never used  Substance and Sexual Activity   Alcohol use: Yes    Alcohol/week: 3.0 standard drinks    Types: 3 Glasses of wine per week    Comment: half  glass wine before dinner   Drug use: No   Sexual activity: Yes  Other Topics Concern   Not on file  Social History Narrative   Not on file   Social Determinants of Health   Financial Resource Strain: Low Risk    Difficulty of Paying Living Expenses: Not hard at all  Food Insecurity: No Food Insecurity   Worried About Charity fundraiser in the Last Year: Never true   [place] in the Last Year: Never true  Transportation Needs: No Transportation Needs   Lack of Transportation (Medical): No   Lack of Transportation (Non-Medical): No  Physical Activity: Sufficiently Active   Days of Exercise per Week: 7 days   Minutes of Exercise per Session: 30 min   Stress: No Stress Concern Present   Feeling of Stress : Not at all  Social Connections: Moderately Isolated   Frequency of Communication with Friends and Family: Twice a week   Frequency of Social Gatherings with Friends and Family: More than three times a week   Attends Religious Services: More than 4 times per year   Active Member of [employer] or Organizations: No   Attends Archivist Meetings: Never   Marital Status: Widowed   Allergies  Allergen Reactions   Statins Other (See Comments)    Muscle pain and upset stomach   Family History  Problem Relation Age of Onset   Heart disease Father    Coronary artery disease Other      Current Outpatient Medications (Cardiovascular):    amLODipine (NORVASC) 5 MG tablet, TAKE 1 TABLET DAILY   benazepril-hydrochlorthiazide (LOTENSIN HCT) 10-12.5 MG tablet, TAKE 1 TABLET DAILY     Current Outpatient Medications (Other):    famotidine (PEPCID) 20 MG tablet, TAKE 1 TABLET AT BEDTIME   finasteride (PROSCAR) 5 MG tablet, TAKE 1 TABLET EACH DAY.   gabapentin (NEURONTIN) 100 MG capsule, TAKE 1 CAPSULE 4 TIMES     DAILY   magnesium oxide (MAG-OX) 400 MG tablet, Take 400 mg by mouth daily.   polyethylene glycol (MIRALAX / GLYCOLAX) packet, Take 17 g by mouth daily as needed for moderate constipation.   triamcinolone ointment (KENALOG) 0.1 %, Apply 1 application topically 2 (two) times daily. To affected area   vitamin C (ASCORBIC ACID) 500 MG tablet, Take 500 mg by mouth daily.   VITAMIN D PO, Take by mouth.   Reviewed prior external information including notes and imaging from  primary care provider As well as notes that were available from care everywhere and other healthcare systems.  Past medical history, social, surgical and family history all reviewed in electronic medical record.  No pertanent information unless stated regarding to the chief complaint.   Review of Systems:  No headache, visual changes, nausea, vomiting,  diarrhea, constipation, dizziness, abdominal pain, skin rash, fevers, chills, night sweats, weight loss, swollen lymph nodes, body aches, joint swelling, chest pain, shortness of breath, mood changes. POSITIVE muscle aches  Objective  Blood pressure (!) 130/58, pulse 86, height 6\' 1"  (1.854 m), SpO2 99 %.   General: No apparent distress alert and oriented x3 mood and affect normal, dressed appropriately.  HEENT: Pupils equal, extraocular movements intact  Respiratory: Patient's speak in full sentences and does not appear short of breath  Cardiovascular: Trace lower extremity edema, non tender, no erythema  Gait severely antalgic Patient does have pain over the left sacroiliac joint that is fairly severe.  Difficulty with any motion in the back  noted today.  Right knee severe arthritic changes with effusion noted.  Abnormality noted with instability with valgus and varus force.  Trigger nodule noted at the A2 pulley of the left ring finger.  After informed written and verbal consent, patient was seated on exam table. Right knee was prepped with alcohol swab and utilizing anterolateral approach, patient's right knee space was injected with 4:1  marcaine 0.5%: Kenalog 40mg /dL. Patient tolerated the procedure well without immediate complications.  After verbal consent patient was prepped with alcohol swab and with a 21-gauge 2 inch needle injected into the left sacroiliac joint with patient in a standing position.  Total of 1 cc of 0.5% Marcaine and 1 cc of Kenalog 40 mg/mL used.  No blood loss.  Band-Aid placed postinjection instructions given.  After verbal consent patient was prepped with alcohol swab and with a 25-gauge half inch needle injected into the left flexor tendon sheath of the ring finger.  Total was 0.5 cc of 0.5% Marcaine and 0.5 cc of Kenalog 40 mg/mL used.   Impression and Recommendations:     The above documentation has been reviewed and is accurate and complete Lyndal Pulley,  DO

## 2021-01-30 ENCOUNTER — Other Ambulatory Visit: Payer: Self-pay

## 2021-01-30 ENCOUNTER — Ambulatory Visit (INDEPENDENT_AMBULATORY_CARE_PROVIDER_SITE_OTHER): Payer: Medicare Other | Admitting: Family Medicine

## 2021-01-30 ENCOUNTER — Encounter: Payer: Self-pay | Admitting: Family Medicine

## 2021-01-30 DIAGNOSIS — M1711 Unilateral primary osteoarthritis, right knee: Secondary | ICD-10-CM | POA: Diagnosis not present

## 2021-01-30 DIAGNOSIS — M533 Sacrococcygeal disorders, not elsewhere classified: Secondary | ICD-10-CM

## 2021-01-30 DIAGNOSIS — G8929 Other chronic pain: Secondary | ICD-10-CM | POA: Diagnosis not present

## 2021-01-30 DIAGNOSIS — M65342 Trigger finger, left ring finger: Secondary | ICD-10-CM | POA: Diagnosis not present

## 2021-01-30 DIAGNOSIS — M5388 Other specified dorsopathies, sacral and sacrococcygeal region: Secondary | ICD-10-CM

## 2021-01-30 NOTE — Patient Instructions (Signed)
Good to see you! See you again in 2 months

## 2021-01-30 NOTE — Assessment & Plan Note (Signed)
Chronic problem with exacerbation.  Given another injection today.  Hopefully this will help out significantly.  Did have aspiration done as well.  Can consider the possibility of viscosupplementation or going back to the PRP but patient does feel the steroids do help.  Follow-up again in 8 to 10 weeks

## 2021-01-30 NOTE — Assessment & Plan Note (Signed)
Chronic problem with exacerbation.  Due to patient's other comorbidities and age this does seem to be the most beneficial for him.  Patient will continue to try to be active and continue to walk with the walker.  He does not want any significant change in management otherwise.  Follow-up again in 8 to 10 weeks

## 2021-02-08 ENCOUNTER — Telehealth: Payer: Medicare Other

## 2021-03-02 ENCOUNTER — Ambulatory Visit (INDEPENDENT_AMBULATORY_CARE_PROVIDER_SITE_OTHER): Payer: Medicare Other | Admitting: Family Medicine

## 2021-03-02 VITALS — BP 130/64 | HR 90 | Temp 97.7°F | Wt 193.5 lb

## 2021-03-02 DIAGNOSIS — M7712 Lateral epicondylitis, left elbow: Secondary | ICD-10-CM | POA: Diagnosis not present

## 2021-03-02 DIAGNOSIS — M25531 Pain in right wrist: Secondary | ICD-10-CM

## 2021-03-02 NOTE — Patient Instructions (Signed)
Try some icing (to elbow) 20 minutes 2-3 times daily  Consider over the counter Diclofenac gel 2-3 times daily.   If not improving in a few weeks we could consider cortisol injection.

## 2021-03-02 NOTE — Progress Notes (Signed)
Established Patient Office Visit  Subjective:  Patient ID: Darin [name], male    DOB: April 02, 1931  Age: 87 y.o. MRN: [id number]  CC:  Chief Complaint  Patient presents with   Arm Swelling    Left elbow and right wrist, x 3 days, very painful, no known injury    HPI Darin [name] presents for the following issues  Onset this week of left lateral elbow pain.  Denies any injury.  He folic elevated swelling over the lateral aspect of the left elbow.  No erythema.  No ecchymosis.  Pain worse with gripping.  Has not tried any icing or anything topical.  He is right-hand dominant.  He ambulates with a walker.  Also relates onset about few days ago of some right wrist pain.  This is on the volar surface medial aspect of wrist.  No visible swelling.  Has had carpal tunnel in the past but this feels differently.  Pain does not radiate into the digits.  No numbness or weakness.  No fall or any other injury.  Past Medical History:  Diagnosis Date   Arthritis    GERD (gastroesophageal reflux disease)    H/O hiatal hernia    "FOR YEARS" " NO PROBLEM"   Hyperlipidemia    Hypertension    Knee pain    Neuropathy    PERIPHERAL    Osteoporosis    PMR (polymyalgia rheumatica) (HCC)    Polymyalgia ([place]) 5 YEARS    Past Surgical History:  Procedure Laterality Date   CARPAL TUNNEL RELEASE     CATARACT EXTRACTION     right   COLONOSCOPY  2007   INGUINAL HERNIA REPAIR  10/14/2011   Procedure: HERNIA REPAIR INGUINAL ADULT;  Surgeon: [name], MD;  Location: [facility];  Service: General;  Laterality: Right;  Right Inguinal Hernia Repair with Mesh   TONSILLECTOMY     VARICOSE VEIN SURGERY      Family History  Problem Relation Age of Onset   Heart disease Father    Coronary artery disease Other     Social History   Socioeconomic History   Marital status: Widowed    Spouse name: Not on file   Number of children: Not on file   Years of education: Not on file   Highest education  level: Not on file  Occupational History   Occupation: retired    Fish farm manager: RETIRED  Tobacco Use   Smoking status: Never   Smokeless tobacco: Never  Vaping Use   Vaping Use: Never used  Substance and Sexual Activity   Alcohol use: Yes    Alcohol/week: 3.0 standard drinks    Types: 3 Glasses of wine per week    Comment: half glass wine before dinner   Drug use: No   Sexual activity: Yes  Other Topics Concern   Not on file  Social History Narrative   Not on file   Social Determinants of Health   Financial Resource Strain: Low Risk    Difficulty of Paying Living Expenses: Not hard at all  Food Insecurity: No Food Insecurity   Worried About Charity fundraiser in the Last Year: Never true   Ran Out of Food in the Last Year: Never true  Transportation Needs: No Transportation Needs   Lack of Transportation (Medical): No   Lack of Transportation (Non-Medical): No  Physical Activity: Sufficiently Active   Days of Exercise per Week: 7 days   Minutes of Exercise per Session: 30 min  Stress: No Stress Concern Present   Feeling of Stress : Not at all  Social Connections: Moderately Isolated   Frequency of Communication with Friends and Family: Twice a week   Frequency of Social Gatherings with Friends and Family: More than three times a week   Attends Religious Services: More than 4 times per year   Active Member of [employer] or Organizations: No   Attends Archivist Meetings: Never   Marital Status: Widowed  Human resources officer Violence: Not At Risk   Fear of Current or Ex-Partner: No   Emotionally Abused: No   Physically Abused: No   Sexually Abused: No    Outpatient Medications Prior to Visit  Medication Sig Dispense Refill   amLODipine (NORVASC) 5 MG tablet TAKE 1 TABLET DAILY 90 tablet 3   benazepril-hydrochlorthiazide (LOTENSIN HCT) 10-12.5 MG tablet TAKE 1 TABLET DAILY 90 tablet 1   famotidine (PEPCID) 20 MG tablet TAKE 1 TABLET AT BEDTIME 90 tablet 1    finasteride (PROSCAR) 5 MG tablet TAKE 1 TABLET EACH DAY. 90 tablet 3   gabapentin (NEURONTIN) 100 MG capsule TAKE 1 CAPSULE 4 TIMES     DAILY 360 capsule 3   magnesium oxide (MAG-OX) 400 MG tablet Take 400 mg by mouth daily.     polyethylene glycol (MIRALAX / GLYCOLAX) packet Take 17 g by mouth daily as needed for moderate constipation.     triamcinolone ointment (KENALOG) 0.1 % Apply 1 application topically 2 (two) times daily. To affected area 30 g 1   vitamin C (ASCORBIC ACID) 500 MG tablet Take 500 mg by mouth daily.     VITAMIN D PO Take by mouth.     No facility-administered medications prior to visit.    Allergies  Allergen Reactions   Statins Other (See Comments)    Muscle pain and upset stomach    ROS Review of Systems  Neurological:  Negative for weakness and numbness.     Objective:    Physical Exam Vitals reviewed.  Constitutional:      Appearance: Normal appearance.  Cardiovascular:     Rate and Rhythm: Normal rate.  Musculoskeletal:     Comments: Left elbow reveals full range of motion.  Minimal swelling over the lateral epicondylar region.  No warmth.  No erythema.  No ecchymosis.  No pain with supination and pronation.  He has tenderness over the left lateral epicondylar region.  Pain worse with wrist extension against resistance  Right wrist reveals no obvious swelling.  No bony tenderness.  Does not have any tenderness over the abductor or extensor tendon of the right thumb.  Distal pulses normal.  Neurological:     Mental Status: He is alert.     Comments: Good grip strength bilaterally    BP 130/64 (BP Location: Left Arm, Patient Position: Sitting, Cuff Size: Normal)   Pulse 90   Temp 97.7 F (36.5 C) (Oral)   Wt 193 lb 8 oz (87.8 kg)   SpO2 99%   BMI 25.53 kg/m  Wt Readings from Last 3 Encounters:  03/02/21 193 lb 8 oz (87.8 kg)  01/10/21 191 lb 12.8 oz (87 kg)  12/15/20 195 lb (88.5 kg)     Health Maintenance Due  Topic Date Due   Zoster  Vaccines- Shingrix (1 of 2) Never done   COVID-19 Vaccine (3 - Pfizer risk series) 05/21/2019    There are no preventive care reminders to display for this patient.  Lab Results  Component Value Date  TSH 2.471 03/29/2012   Lab Results  Component Value Date   WBC 6.7 05/02/2017   HGB 11.8 (L) 05/02/2017   HCT 35.8 (L) 05/02/2017   MCV 90.9 05/02/2017   PLT 265 05/02/2017   Lab Results  Component Value Date   NA 139 02/25/2020   K 4.1 02/25/2020   CO2 29 02/25/2020   GLUCOSE 78 02/25/2020   BUN 16 02/25/2020   CREATININE 0.73 02/25/2020   BILITOT 0.6 03/12/2017   ALKPHOS 52 03/12/2017   AST 19 03/12/2017   ALT 15 (L) 03/12/2017   PROT 6.1 (L) 03/12/2017   ALBUMIN 3.1 (L) 03/12/2017   CALCIUM 9.8 02/25/2020   ANIONGAP 6 03/16/2017   GFR 102.17 04/08/2018   Lab Results  Component Value Date   CHOL 228 (H) 03/30/2012   Lab Results  Component Value Date   HDL 100 03/30/2012   Lab Results  Component Value Date   LDLCALC 116 (H) 03/30/2012   Lab Results  Component Value Date   TRIG 61 03/30/2012   Lab Results  Component Value Date   CHOLHDL 2.3 03/30/2012   No results found for: HGBA1C    Assessment & Plan:   #1 left lateral epicondylitis -Recommend icing 20 minutes 2-3 times daily -Consider topical diclofenac gel 2-3 times daily -Consider cortisone injection if not improving over the next few weeks  #2 right wrist pain.  Etiology unclear.  Question tendon strain.  No evidence for de Quervain's tenosynovitis.  Doubt carpal tunnel syndrome. Observe for now and follow-up if symptoms persist   No orders of the defined types were placed in this encounter.   Follow-up: No follow-ups on file.    [name], MD

## 2021-03-06 ENCOUNTER — Other Ambulatory Visit: Payer: Self-pay | Admitting: Family Medicine

## 2021-03-12 NOTE — Progress Notes (Signed)
[place] Phone: [phone] Subjective:   [name], am serving as a scribe for Dr. Hulan Saas.This visit occurred during the SARS-CoV-2 public health emergency.  Safety protocols were in place, including screening questions prior to the visit, additional usage of staff PPE, and extensive cleaning of exam room while observing appropriate contact time as indicated for disinfecting solutions.  I'm seeing this patient by the request  of:  [name], MD  CC: Right knee pain, left hip pain  OEV:[id number]  01/30/2021 Chronic problem with exacerbation.  Due to patient's other comorbidities and age this does seem to be the most beneficial for him.  Patient will continue to try to be active and continue to walk with the walker.  He does not want any significant change in management otherwise.  Follow-up again in 8 to 10 weeks  Chronic problem with exacerbation.  Given another injection today.  Hopefully this will help out significantly.  Did have aspiration done as well.  Can consider the possibility of viscosupplementation or going back to the PRP but patient does feel the steroids do help.  Follow-up again in 8 to 10 weeks  Update 03/13/2021 Ayoub Arey is a 87 y.o. male coming in with complaint of L SI joint & R knee, pain. Patient states that injection in SI joint did help but is wearing off. Knee pain has not improved since injection. Standing increases his pain.   New complaint today of constant lateral L elbow pain. Using oral anti-inflammatory as well as Voltaren. Notices a small lump over epicondyle.  This is a new problem.  Saw primary care provider and was diagnosed with lateral epicondylitis.  Patient would like to know if there is anything else that can be done because it is giving him difficulty with daily activities.       Past Medical History:  Diagnosis Date   Arthritis    GERD  (gastroesophageal reflux disease)    H/O hiatal hernia    "FOR YEARS" " NO PROBLEM"   Hyperlipidemia    Hypertension    Knee pain    Neuropathy    PERIPHERAL    Osteoporosis    PMR (polymyalgia rheumatica) (HCC)    Polymyalgia ([place]) 5 YEARS   Past Surgical History:  Procedure Laterality Date   CARPAL TUNNEL RELEASE     CATARACT EXTRACTION     right   COLONOSCOPY  2007   INGUINAL HERNIA REPAIR  10/14/2011   Procedure: HERNIA REPAIR INGUINAL ADULT;  Surgeon: [name], MD;  Location: [facility];  Service: General;  Laterality: Right;  Right Inguinal Hernia Repair with Mesh   TONSILLECTOMY     VARICOSE VEIN SURGERY     Social History   Socioeconomic History   Marital status: Widowed    Spouse name: Not on file   Number of children: Not on file   Years of education: Not on file   Highest education level: Not on file  Occupational History   Occupation: retired    Fish farm manager: RETIRED  Tobacco Use   Smoking status: Never   Smokeless tobacco: Never  Vaping Use   Vaping Use: Never used  Substance and Sexual Activity   Alcohol use: Yes    Alcohol/week: 3.0 standard drinks    Types: 3 Glasses of wine per week    Comment: half glass wine before dinner   Drug use: No   Sexual activity: Yes  Other Topics Concern   Not on file  Social History Narrative   Not on file   Social Determinants of Health   Financial Resource Strain: Low Risk    Difficulty of Paying Living Expenses: Not hard at all  Food Insecurity: No Food Insecurity   Worried About Charity fundraiser in the Last Year: Never true   [place] in the Last Year: Never true  Transportation Needs: No Transportation Needs   Lack of Transportation (Medical): No   Lack of Transportation (Non-Medical): No  Physical Activity: Sufficiently Active   Days of Exercise per Week: 7 days   Minutes of Exercise per Session: 30 min  Stress: No Stress Concern Present   Feeling of Stress : Not at all  Social  Connections: Moderately Isolated   Frequency of Communication with Friends and Family: Twice a week   Frequency of Social Gatherings with Friends and Family: More than three times a week   Attends Religious Services: More than 4 times per year   Active Member of [employer] or Organizations: No   Attends Archivist Meetings: Never   Marital Status: Widowed   Allergies  Allergen Reactions   Statins Other (See Comments)    Muscle pain and upset stomach   Family History  Problem Relation Age of Onset   Heart disease Father    Coronary artery disease Other      Current Outpatient Medications (Cardiovascular):    amLODipine (NORVASC) 5 MG tablet, TAKE 1 TABLET DAILY   benazepril-hydrochlorthiazide (LOTENSIN HCT) 10-12.5 MG tablet, TAKE 1 TABLET DAILY     Current Outpatient Medications (Other):    famotidine (PEPCID) 20 MG tablet, TAKE 1 TABLET AT BEDTIME   finasteride (PROSCAR) 5 MG tablet, TAKE 1 TABLET EACH DAY.   gabapentin (NEURONTIN) 100 MG capsule, TAKE 1 CAPSULE 4 TIMES     DAILY   magnesium oxide (MAG-OX) 400 MG tablet, Take 400 mg by mouth daily.   polyethylene glycol (MIRALAX / GLYCOLAX) packet, Take 17 g by mouth daily as needed for moderate constipation.   triamcinolone ointment (KENALOG) 0.1 %, Apply 1 application topically 2 (two) times daily. To affected area   vitamin C (ASCORBIC ACID) 500 MG tablet, Take 500 mg by mouth daily.   VITAMIN D PO, Take by mouth.   Reviewed prior external information including notes and imaging from  primary care provider As well as notes that were available from care everywhere and other healthcare systems.  Past medical history, social, surgical and family history all reviewed in electronic medical record.  No pertanent information unless stated regarding to the chief complaint.   Review of Systems:  No headache, visual changes, nausea, vomiting, diarrhea, constipation, dizziness, abdominal pain, skin rash, fevers, chills,  night sweats, weight loss, swollen lymph nodes,  chest pain, shortness of breath, mood changes. POSITIVE muscle aches, body aches, joint swelling  Objective  Blood pressure (!) 110/58, pulse 82, height 6\' 1"  (1.854 m), weight 193 lb (87.5 kg), SpO2 98 %.   General: No apparent distress alert and oriented x3 mood and affect normal, dressed appropriately.  HEENT: Pupils equal, extraocular movements intact  Respiratory: Patient's speak in full sentences and does not appear short of breath  Cardiovascular: Trace lower extremity edema, non tender, no erythema  Gait antalgic gait using the aid of a walker. Right knee exam does have effusion noted.  Patient does have tenderness to palpation diffusely in instability noted with valgus and varus force. Left  hip more discomfort over the greater trochanteric area today.  Still having pain over the left sacroiliac joint as well.  Loss of lordosis of the lumbar spine. Left elbow does have swelling noted over the lateral epicondylar region.  Severe tenderness with resisted extension of the middle finger.  Limited muscular skeletal ultrasound was performed and interpreted by Hulan Saas, M   Limited ultrasound of patient's elbow shows the patient does have what appears to be an avulsion fracture noted of the lateral condyle as well as tearing of the common extensor tendon with some mild retraction. Impression: Avulsion and tear of the common extensor tendon  After informed written and verbal consent, patient was seated on exam table. Right knee was prepped with alcohol swab and utilizing anterolateral approach, patient's right knee space was injected with 48 mg per 3 mL of Monovisc (sodium hyaluronate) in a prefilled syringe was injected easily into the knee through a 22-gauge needle..Patient tolerated the procedure well without immediate complications.  After verbal consent patient was prepped with alcohol swab and with a 21-gauge 2 inch needle injected  into the left greater trochanteric area with a total of 2 cc of 0.5% Marcaine and 1 cc of Kenalog 40 mg/mL.  No blood loss.  Band-Aid placed.  Postinjection instructions given   Impression and Recommendations:     The above documentation has been reviewed and is accurate and complete Lyndal Pulley, DO

## 2021-03-13 ENCOUNTER — Ambulatory Visit (INDEPENDENT_AMBULATORY_CARE_PROVIDER_SITE_OTHER): Payer: Medicare Other

## 2021-03-13 ENCOUNTER — Encounter: Payer: Self-pay | Admitting: Family Medicine

## 2021-03-13 ENCOUNTER — Other Ambulatory Visit: Payer: Self-pay

## 2021-03-13 ENCOUNTER — Ambulatory Visit (INDEPENDENT_AMBULATORY_CARE_PROVIDER_SITE_OTHER): Payer: Medicare Other | Admitting: Family Medicine

## 2021-03-13 ENCOUNTER — Ambulatory Visit: Payer: Self-pay

## 2021-03-13 VITALS — BP 110/58 | HR 82 | Ht 73.0 in | Wt 193.0 lb

## 2021-03-13 DIAGNOSIS — M1711 Unilateral primary osteoarthritis, right knee: Secondary | ICD-10-CM

## 2021-03-13 DIAGNOSIS — M25522 Pain in left elbow: Secondary | ICD-10-CM

## 2021-03-13 DIAGNOSIS — M7062 Trochanteric bursitis, left hip: Secondary | ICD-10-CM

## 2021-03-13 DIAGNOSIS — S56519A Strain of other extensor muscle, fascia and tendon at forearm level, unspecified arm, initial encounter: Secondary | ICD-10-CM | POA: Diagnosis not present

## 2021-03-13 NOTE — Assessment & Plan Note (Signed)
Partial.  Noted on ultrasound.   neck exam with noted.  We will get x-rays evaluate.  Patient will avoid repetitive extension

## 2021-03-13 NOTE — Patient Instructions (Addendum)
GT injection Monovisc today Lateral epicondyle brace and exercises Xray today Brace day and night for 2 weeks then at night for 2 weeks No over hand lifting See me again in 5 weeks

## 2021-03-13 NOTE — Assessment & Plan Note (Signed)
Patient given injection today and tolerated the procedure well.  Chronic problem with exacerbation.  We will see how patient responds to viscosupplementation.  Follow-up again in 5 to 6 weeks worsening pain consider the possibility of steroid injection again.

## 2021-03-20 DIAGNOSIS — D225 Melanocytic nevi of trunk: Secondary | ICD-10-CM | POA: Diagnosis not present

## 2021-03-20 DIAGNOSIS — D2261 Melanocytic nevi of right upper limb, including shoulder: Secondary | ICD-10-CM | POA: Diagnosis not present

## 2021-03-20 DIAGNOSIS — D2271 Melanocytic nevi of right lower limb, including hip: Secondary | ICD-10-CM | POA: Diagnosis not present

## 2021-03-20 DIAGNOSIS — D2262 Melanocytic nevi of left upper limb, including shoulder: Secondary | ICD-10-CM | POA: Diagnosis not present

## 2021-03-20 DIAGNOSIS — L738 Other specified follicular disorders: Secondary | ICD-10-CM | POA: Diagnosis not present

## 2021-03-20 DIAGNOSIS — Z85828 Personal history of other malignant neoplasm of skin: Secondary | ICD-10-CM | POA: Diagnosis not present

## 2021-03-20 DIAGNOSIS — D2272 Melanocytic nevi of left lower limb, including hip: Secondary | ICD-10-CM | POA: Diagnosis not present

## 2021-04-03 ENCOUNTER — Telehealth: Payer: Self-pay | Admitting: Pharmacist

## 2021-04-03 NOTE — Progress Notes (Signed)
° ° °  Chronic Care Management Pharmacy Assistant   Name: Darin [name]  MRN: [id number] DOB: 1932-03-13  04/03/21 APPOINTMENT REMINDER   Called Patient No answer, left message of appointment on Fri  at 8:30 via telephone visit with Jeni Salles, Pharm D.   Notified to have all medications, supplements, blood pressure and/or blood sugar logs available during appointment and to return call if need to reschedule.     Care Gaps: CCM F/U Call - Scheduled 1/23 AWV - Scheduled for 4/23 Zoster Vaccine - Overdue COVID Booster#3(Pfizer) - Overdue BP - 110/58 ( 03/13/21)  Star Rating Drug: Benazepril-HCT (Losentin HCT) 10-12.5 mg - Last filled 02/23/21 DS at [facility]  Any gaps in medications fill history?  None    Medications: Outpatient Encounter Medications as of 04/03/2021  Medication Sig   amLODipine (NORVASC) 5 MG tablet TAKE 1 TABLET DAILY   benazepril-hydrochlorthiazide (LOTENSIN HCT) 10-12.5 MG tablet TAKE 1 TABLET DAILY   famotidine (PEPCID) 20 MG tablet TAKE 1 TABLET AT BEDTIME   finasteride (PROSCAR) 5 MG tablet TAKE 1 TABLET EACH DAY.   gabapentin (NEURONTIN) 100 MG capsule TAKE 1 CAPSULE 4 TIMES     DAILY   magnesium oxide (MAG-OX) 400 MG tablet Take 400 mg by mouth daily.   polyethylene glycol (MIRALAX / GLYCOLAX) packet Take 17 g by mouth daily as needed for moderate constipation.   triamcinolone ointment (KENALOG) 0.1 % Apply 1 application topically 2 (two) times daily. To affected area   vitamin C (ASCORBIC ACID) 500 MG tablet Take 500 mg by mouth daily.   VITAMIN D PO Take by mouth.   No facility-administered encounter medications on file as of 04/03/2021.      [facility] Clinical Pharmacist Assistant [phone]

## 2021-04-04 ENCOUNTER — Telehealth: Payer: Medicare Other

## 2021-04-06 ENCOUNTER — Ambulatory Visit (INDEPENDENT_AMBULATORY_CARE_PROVIDER_SITE_OTHER): Payer: Medicare Other | Admitting: Pharmacist

## 2021-04-06 DIAGNOSIS — N400 Enlarged prostate without lower urinary tract symptoms: Secondary | ICD-10-CM

## 2021-04-06 DIAGNOSIS — I10 Essential (primary) hypertension: Secondary | ICD-10-CM

## 2021-04-06 NOTE — Patient Instructions (Signed)
Hi Darin [name],  It was great to catch up on the telephone! Don't forget to try to check your blood pressure at home about once a week to make sure it's not dropping too low.  Please reach out to me if you have any questions or need anything before our follow up!  Best, Maddie  Jeni Salles, PharmD, [facility]   Visit Information   Goals Addressed   None    Patient Care Plan: CCM Pharmacy Care Plan     Problem Identified: Problem: Hypertension, GERD, BPH and Neuropathy      Long-Range Goal: Patient-Specific Goal   Start Date: 08/03/2020  Expected End Date: 08/03/2021  Recent Progress: On track  Priority: High  Note:   Current Barriers:  Unable to independently monitor therapeutic efficacy  Pharmacist Clinical Goal(s):  Patient will achieve adherence to monitoring guidelines and medication adherence to achieve therapeutic efficacy through collaboration with PharmD and provider.   Interventions: 1:1 collaboration with Darin Post, MD regarding development and update of comprehensive plan of care as evidenced by provider attestation and co-signature Inter-disciplinary care team collaboration (see longitudinal plan of care) Comprehensive medication review performed; medication list updated in electronic medical record  Hypertension (BP goal <140/90) -Controlled -Current treatment: Amlodipine 5 mg 1 tablet daily - appropriate, effective, safe, accessible Benazepril-HCTZ 10-12.5 mg 1 tablet daily - appropriate, effective, safe, accessible -Medications previously tried: none  -Current home readings: hasn't been checking it at home; used to have one but not sure where is it (arm cuff) -Current dietary habits: did not discuss -Current exercise habits: did not discuss -Denies hypotensive/hypertensive symptoms -Educated on Importance of home blood pressure monitoring; Proper BP monitoring technique; Symptoms of  hypotension and importance of maintaining adequate hydration; -Counseled to monitor BP at home weekly, document, and provide log at future appointments -Counseled on diet and exercise extensively Recommended to continue current medication  BPH (Goal: minimize symptoms of enlarged prostate) -Controlled -Current treatment  Finasteride 5 mg 1 tablet daily - appropriate, effective, safe, accessible -Medications previously tried: none  -Recommended to continue current medication  GERD (Goal: minimize symptoms of acid reflux/indigestion) -Controlled -Current treatment  Famotidine 20 mg 1 tablet at bedtime - appropriate, effective, safe, accessible -Medications previously tried: n/a  -Counseled on non-pharmacologic management of symptoms such as elevating the head of your bed, avoiding eating 2-3 hours before bed, avoiding triggering foods such as acidic, spicy, or fatty foods, eating smaller meals, and wearing clothes that are loose around the waist  Neuropathy (Goal: minimize pain) -Controlled -Current treatment  Gabapentin 100 mg 1 capsule four times daily - appropriate, effective, safe, accessible -Medications previously tried: none  -Recommended to continue current medication  Health Maintenance -Vaccine gaps: shingles (talked to Dr. Elease Hashimoto - he said to hold off) -Current therapy:  Miralax packet twice daily  Triamcinolone ointment 0.1% Vitamin C 500 mg 1 tablet daily Magnesium 400 mg daily Vitamin D 1000 units daily Tart cherry extract 1000 mg daily -Educated on Cost vs benefit of each product must be carefully weighed by individual consumer -Patient is satisfied with current therapy and denies issues -Recommended to continue current medication  Patient Goals/Self-Care Activities Patient will:  - take medications as prescribed check blood pressure weekly, document, and provide at future appointments target a minimum of 150 minutes of moderate intensity exercise  weekly  Follow Up Plan: Telephone follow up appointment with care management team member scheduled for: 1 year  Patient verbalizes understanding of instructions and care plan provided today and agrees to view in [place]. Active MyChart status confirmed with patient.   Telephone follow up appointment with pharmacy team member scheduled for: 1 year  Viona Gil[place], [facility]

## 2021-04-06 NOTE — Progress Notes (Signed)
Chronic Care Management Pharmacy Note  04/06/2021 Name:  Darin [name] MRN:  [id number] DOB:  11-Oct-1931  Summary: BP was low at last office visit Pt is still dealing with joint pain  Recommendations/Changes made from today's visit: -Recommended routine BP monitoring at home and bringing BP cuff to next office visit to ensure accuracy  Plan: BP assessment in 1-2 months  Subjective: Darin [name] is an 88 y.o. year old male who is a primary patient of Burchette, Alinda Sierras, MD.  The CCM team was consulted for assistance with disease management and care coordination needs.    Engaged with patient by telephone for follow up visit in response to provider referral for pharmacy case management and/or care coordination services.   Consent to Services:  The patient was given information about Chronic Care Management services, agreed to services, and gave verbal consent prior to initiation of services.  Please see initial visit note for detailed documentation.   Patient Care Team: [name], MD as PCP - General (Family Medicine) Gean Birchwood, DPM as Consulting Physician (Podiatry) Viona Gilmore, [facility] as Pharmacist (Pharmacist)  Recent office visits: 03/02/21 [name], MD: Patient presented for office visit for arm swelling. Recommended icing and consider topical diclofenac.  01/10/21 [name], MD: Patient presented for cerumen impaction and hearing loss. Influenza vaccine administered.  Recent consult visits: 03/13/21 Hulan Saas, DO (sports medicine): Patient presented for pain follow up and new complaint of elbow pain. Viscosupplementation given and steroid injection administered.  01/30/21 Hulan Saas, DO (sports medicine): Patient presented for left trigger finger and continued knee pain. Steroid injection administered into knee and left sacroiliac joint.  01/26/21 Gardiner Barefoot, DPM (podiatry): Patient presented for toenail debridement.  12-15-2020  Lyndal Pulley, DO (Sports Med) - Patient presented for Arthritis of right ankle and other concerns. No medication changes.   11-22-2020 Gregor Hams, MD  (Sports Med) - Patient presented to [place] for CT of right foot without contrast. IMPRESSION:  Band-like area of sclerosis within the posterior calcaneus is suspicious for stress fracture.  No evidence of fracture involving the anterior process of the calcaneus, as questioned radiographically. Diffuse demineralization.    11-21-2020 Lelon Perla, MD (Cardiology) - Patient presented to [facility] for LE Venous DVT.   11-20-2020 Gregor Hams, MD  (Sports Med) - Patient presented for Peroneal tendinitis and other concerns. No medication changes.     Hospital visits: None in previous 6 months  Objective:  Lab Results  Component Value Date   CREATININE 0.73 02/25/2020   BUN 16 02/25/2020   GFR 102.17 04/08/2018   GFRNONAA >60 03/16/2017   GFRAA >60 03/16/2017   NA 139 02/25/2020   K 4.1 02/25/2020   CALCIUM 9.8 02/25/2020   CO2 29 02/25/2020   GLUCOSE 78 02/25/2020    Lab Results  Component Value Date/Time   GFR 102.17 04/08/2018 08:46 AM   GFR 120.26 04/02/2017 11:56 AM    Last diabetic Eye exam: No results found for: HMDIABEYEEXA  Last diabetic Foot exam: No results found for: HMDIABFOOTEX   Lab Results  Component Value Date   CHOL 228 (H) 03/30/2012   HDL 100 03/30/2012   LDLCALC 116 (H) 03/30/2012   LDLDIRECT 148.5 12/12/2009   TRIG 61 03/30/2012   CHOLHDL 2.3 03/30/2012    Hepatic Function Latest Ref Rng & Units 03/12/2017 03/29/2012 03/09/2012  Total Protein 6.5 - 8.1 g/dL 6.1(L) 5.9(L) 7.0  Albumin 3.5 -  5.0 g/dL 3.1(L) 2.9(L) 3.6  AST 15 - 41 U/L _0 ALT 17 - 63 U/L 15(L) 16 21  Alk Phosphatase 38 - 126 U/L 52 44 40  Total Bilirubin 0.3 - 1.2 mg/dL 0.6 0.3 0.7  Bilirubin, Direct 0.0 - 0.3 mg/dL - <0.1 -    Lab Results  Component Value Date/Time   TSH  2.471 03/29/2012 08:35 PM   TSH 3.22 03/01/2008 10:42 AM    CBC Latest Ref Rng & Units 05/02/2017 04/02/2017 03/16/2017  WBC 3.8 - 10.8 Thousand/uL 6.7 8.1 6.7  Hemoglobin 13.2 - 17.1 g/dL 11.8(L) 11.1(L) 10.0(L)  Hematocrit 38.5 - 50.0 % 35.8(L) 33.4(L) 29.2(L)  Platelets 140 - 400 Thousand/uL 265 240.0 216    No results found for: VD25OH  Clinical ASCVD: No  The ASCVD Risk score (Arnett DK, et al., 2019) failed to calculate for the following reasons:   The 2019 ASCVD risk score is only valid for ages 17 to 25    Depression screen PHQ 2/9 06/20/2020 02/25/2020 10/20/2017  Decreased Interest 0 0 0  Down, Depressed, Hopeless 0 0 0  PHQ - 2 Score 0 0 0  Some recent data might be hidden      Social History   Tobacco Use  Smoking Status Never  Smokeless Tobacco Never   BP Readings from Last 3 Encounters:  03/13/21 (!) 110/58  03/02/21 130/64  01/30/21 (!) 130/58   Pulse Readings from Last 3 Encounters:  03/13/21 82  03/02/21 90  01/30/21 86   Wt Readings from Last 3 Encounters:  03/13/21 193 lb (87.5 kg)  03/02/21 193 lb 8 oz (87.8 kg)  01/10/21 191 lb 12.8 oz (87 kg)   BMI Readings from Last 3 Encounters:  03/13/21 25.46 kg/m  03/02/21 25.53 kg/m  01/30/21 25.30 kg/m    Assessment/Interventions: Review of patient past medical history, allergies, medications, health status, including review of consultants reports, laboratory and other test data, was performed as part of comprehensive evaluation and provision of chronic care management services.   SDOH:  (Social Determinants of Health) assessments and interventions performed: No  SDOH Screenings   Alcohol Screen: Not on file  Depression (PHQ2-9): Low Risk    PHQ-2 Score: 0  Financial Resource Strain: Low Risk    Difficulty of Paying Living Expenses: Not hard at all  Food Insecurity: No Food Insecurity   Worried About Charity fundraiser in the Last Year: Never true   Ran Out of Food in the Last Year: Never true   Housing: Low Risk    Last Housing Risk Score: 0  Physical Activity: Sufficiently Active   Days of Exercise per Week: 7 days   Minutes of Exercise per Session: 30 min  Social Connections: Moderately Isolated   Frequency of Communication with Friends and Family: Twice a week   Frequency of Social Gatherings with Friends and Family: More than three times a week   Attends Religious Services: More than 4 times per year   Active Member of [employer] or Organizations: No   Attends Archivist Meetings: Never   Marital Status: Widowed  Stress: No Stress Concern Present   Feeling of Stress : Not at all  Tobacco Use: Low Risk    Smoking Tobacco Use: Never   Smokeless Tobacco Use: Never   Passive Exposure: Not on file  Transportation Needs: No Transportation Needs   Lack of Transportation (Medical): No   Lack of Transportation (Non-Medical): No    CCM  Care Plan  Allergies  Allergen Reactions   Statins Other (See Comments)    Muscle pain and upset stomach    Medications Reviewed Today     Reviewed by Rebecca Eaton, CMA (Certified Medical Assistant) on 03/02/21 at [phone]  Med List Status: <None>   Medication Order Taking? Sig Documenting Provider Last Dose Status Informant  amLODipine (NORVASC) 5 MG tablet [id number] Yes TAKE 1 TABLET DAILY [name], MD Taking Active   benazepril-hydrochlorthiazide (LOTENSIN HCT) 10-12.5 MG tablet [id number] Yes TAKE 1 TABLET DAILY Burchette, Alinda Sierras, MD Taking Active   famotidine (PEPCID) 20 MG tablet [id number] Yes TAKE 1 TABLET AT BEDTIME [name], MD Taking Active   finasteride (PROSCAR) 5 MG tablet [id number] Yes TAKE 1 TABLET EACH DAY. [name], MD Taking Active   gabapentin (NEURONTIN) 100 MG capsule [id number] Yes TAKE 1 CAPSULE 4 TIMES     DAILY Burchette, Alinda Sierras, MD Taking Active   magnesium oxide (MAG-OX) 400 MG tablet [id number] Yes Take 400 mg by mouth daily. [provider] Taking Active    polyethylene glycol Merril Abbe / GLYCOLAX) packet [id number] Yes Take 17 g by mouth daily as needed for moderate constipation. Aline August, MD Taking Active   triamcinolone ointment (KENALOG) 0.1 % [id number] Yes Apply 1 application topically 2 (two) times daily. To affected area Lyndal Pulley, DO Taking Active   vitamin C (ASCORBIC ACID) 500 MG tablet [id number] Yes Take 500 mg by mouth daily. [provider] Taking Active Self  VITAMIN D PO [id number] Yes Take by mouth. [provider] Taking Active             Patient Active Problem List   Diagnosis Date Noted   Partial tear of common extensor tendon of elbow 03/13/2021   Arthritis of right ankle 10/31/2020   Trigger finger, left ring finger 09/15/2020   Greater trochanteric bursitis of left hip 02/12/2019   Strain of left piriformis muscle 10/05/2018   Pain due to onychomycosis of toenails of both feet 09/23/2018   Chronic left SI joint pain 04/27/2018   Right wrist pain 02/05/2018   Degenerative arthritis of right knee 10/23/2017   Left leg cellulitis 08/12/2017   Arthritis of right knee 05/20/2017   Diverticulosis of colon with hemorrhage    Acute blood loss anemia    GIB (gastrointestinal bleeding) 03/12/2017   Leg edema, right 02/20/2015   BPH (benign prostatic hyperplasia) 12/09/2014   Chronic radicular low back pain 08/06/2012   COPD (chronic obstructive pulmonary disease) ([place]) 04/21/2012   Syncope 03/29/2012   Hyponatremia 03/29/2012   Localized osteoarthrosis not specified whether primary or secondary, lower leg 06/20/2010   VARICOSE VEINS LOWER EXTREMITIES W/INFLAMMATION 02/12/2010   [place] LOCALIZED OSTEOARTHROSIS INVOLVING HAND 10/16/2009   DUPUYTREN'S CONTRACTURE, RIGHT 07/04/2009   DEGENERATIVE DISC DISEASE, LUMBOSACRAL SPINE W/RADICULOPATHY 06/19/2009   CARCINOMA, BASAL CELL 02/07/2009   ANEMIA OF CHRONIC DISEASE 02/07/2009   BURSITIS, RIGHT SHOULDER 10/07/2008   CATARACT ASSOCIATED WITH  OTHER SYNDROMES 08/01/2008   DRY SKIN 08/01/2008   CERUMEN IMPACTION, BILATERAL 03/01/2008   PMR (polymyalgia rheumatica) ([place]) 01/18/2008   CRAMP IN LIMB 01/18/2008   DEPRESSION, SITUATIONAL, ACUTE 12/28/2007   CONSTIPATION, DRUG INDUCED 12/28/2007   POLYNEUROPATHY OTHER DISEASES CLASSIFIED ELSW 09/24/2007   WEIGHT LOSS, ABNORMAL 01/07/2007   Hyperlipidemia 09/22/2006   Essential hypertension 09/22/2006   GERD (gastroesophageal reflux disease) 09/22/2006   Osteoarthritis 09/22/2006    Immunization History  Administered Date(s) Administered   Fluad Quad(high Dose  65+) 01/10/2021   Hep A / Hep B 06/15/2012, 07/17/2012, 12/18/2012   Influenza Whole 03/26/2003, 01/12/2007   Influenza, High Dose Seasonal PF 01/27/2015, 12/21/2015   Influenza,inj,Quad PF,6+ Mos 03/08/2014   Influenza-Unspecified 12/22/2019   PFIZER(Purple Top)SARS-COV-2 Vaccination 04/02/2019, 04/23/2019   Pneumococcal Conjugate-13 11/16/2013   Pneumococcal Polysaccharide-23 12/12/2009   Td 03/26/2003   Tdap 06/15/2015   Patient reports his biggest concern right now is still his still his joint pain. He just got a gel injection in his knee.  Patient has cut down some on his intake of food. He is still having heartburn some.  Conditions to be addressed/monitored:  Hypertension, GERD, BPH and Neuropathy  Conditions addressed this visit: Hypertension, GERD  Care Plan : CCM Pharmacy Care Plan  Updates made by Viona Gilmore, [place] since 04/06/2021 12:00 AM     Problem: Problem: Hypertension, GERD, BPH and Neuropathy      Long-Range Goal: Patient-Specific Goal   Start Date: 08/03/2020  Expected End Date: 08/03/2021  Recent Progress: On track  Priority: High  Note:   Current Barriers:  Unable to independently monitor therapeutic efficacy  Pharmacist Clinical Goal(s):  Patient will achieve adherence to monitoring guidelines and medication adherence to achieve therapeutic efficacy through collaboration with  PharmD and provider.   Interventions: 1:1 collaboration with [name], MD regarding development and update of comprehensive plan of care as evidenced by provider attestation and co-signature Inter-disciplinary care team collaboration (see longitudinal plan of care) Comprehensive medication review performed; medication list updated in electronic medical record  Hypertension (BP goal <140/90) -Controlled -Current treatment: Amlodipine 5 mg 1 tablet daily - appropriate, effective, safe, accessible Benazepril-HCTZ 10-12.5 mg 1 tablet daily - appropriate, effective, safe, accessible -Medications previously tried: none  -Current home readings: hasn't been checking it at home; used to have one but not sure where is it (arm cuff) -Current dietary habits: did not discuss -Current exercise habits: did not discuss -Denies hypotensive/hypertensive symptoms -Educated on Importance of home blood pressure monitoring; Proper BP monitoring technique; Symptoms of hypotension and importance of maintaining adequate hydration; -Counseled to monitor BP at home weekly, document, and provide log at future appointments -Counseled on diet and exercise extensively Recommended to continue current medication  BPH (Goal: minimize symptoms of enlarged prostate) -Controlled -Current treatment  Finasteride 5 mg 1 tablet daily - appropriate, effective, safe, accessible -Medications previously tried: none  -Recommended to continue current medication  GERD (Goal: minimize symptoms of acid reflux/indigestion) -Controlled -Current treatment  Famotidine 20 mg 1 tablet at bedtime - appropriate, effective, safe, accessible -Medications previously tried: n/a  -Counseled on non-pharmacologic management of symptoms such as elevating the head of your bed, avoiding eating 2-3 hours before bed, avoiding triggering foods such as acidic, spicy, or fatty foods, eating smaller meals, and wearing clothes that are loose  around the waist  Neuropathy (Goal: minimize pain) -Controlled -Current treatment  Gabapentin 100 mg 1 capsule four times daily - appropriate, effective, safe, accessible -Medications previously tried: none  -Recommended to continue current medication  Health Maintenance -Vaccine gaps: shingles (talked to Dr. Elease Hashimoto - he said to hold off) -Current therapy:  Miralax packet twice daily  Triamcinolone ointment 0.1% Vitamin C 500 mg 1 tablet daily Magnesium 400 mg daily Vitamin D 1000 units daily Tart cherry extract 1000 mg daily -Educated on Cost vs benefit of each product must be carefully weighed by individual consumer -Patient is satisfied with current therapy and denies issues -Recommended to continue current medication  Patient Goals/Self-Care  Activities Patient will:  - take medications as prescribed check blood pressure weekly, document, and provide at future appointments target a minimum of 150 minutes of moderate intensity exercise weekly  Follow Up Plan: Telephone follow up appointment with care management team member scheduled for: 1 year      Medication Assistance: None required.  Patient affirms current coverage meets needs.  Compliance/Adherence/Medication fill history: Care Gaps: Shingrix , COVID booster Last BP: 110/58  Star-Rating Drugs: Benazepril-HCT (Lotensin HCT) 10-12.5 mg - Last filled 02/23/21 DS at [facility]  Patient's preferred pharmacy is:  [facility], [place] [address] Phone: [phone] Fax: [phone]  [facility], [place] to [address] [place] [address] Phone: [phone] Fax: [phone]  Uses pill box? Yes - 2 weeks at a time Pt endorses 100% compliance  We discussed: Current pharmacy is preferred with insurance plan and patient  is satisfied with pharmacy services Patient decided to: Continue current medication management strategy  Care Plan and Follow Up Patient Decision:  Patient agrees to Care Plan and Follow-up.  Plan: Telephone follow up appointment with care management team member scheduled for:  1 year  Jeni Salles, PharmD [place] Pharmacist [employer] at [place] [phone]

## 2021-04-18 ENCOUNTER — Other Ambulatory Visit: Payer: Self-pay | Admitting: Family Medicine

## 2021-04-23 NOTE — Progress Notes (Signed)
Darin [name] [address] [place] Phone: [phone] Subjective:   Darin [name], am serving as a scribe for Dr. Hulan Saas. This visit occurred during the SARS-CoV-2 public health emergency.  Safety protocols were in place, including screening questions prior to the visit, additional usage of staff PPE, and extensive cleaning of exam room while observing appropriate contact time as indicated for disinfecting solutions.   I'm seeing this patient by the request  of:  [name], MD  CC: Multiple complaint follow-up  LXB:[id number]  03/13/2021 Partial.  Noted on ultrasound.   neck exam with noted.  We will get x-rays evaluate.  Patient will avoid repetitive extension  Patient given injection today and tolerated the procedure well.  Chronic problem with exacerbation.  We will see how patient responds to viscosupplementation.  Follow-up again in 5 to 6 weeks worsening pain consider the possibility of steroid injection again.  Update 04/24/2021 Darin [name] is a 88 y.o. male coming in with complaint of R knee and L elbow pain. Patient states gel shot helped with the grinding, but in the morning doesn't feel like weight will support him and its been harder to walk. Trigger finger in left hand starting to come back. He is wearing splint at night. Elbow is doing much better.       Past Medical History:  Diagnosis Date   Arthritis    GERD (gastroesophageal reflux disease)    H/O hiatal hernia    "FOR YEARS" " NO PROBLEM"   Hyperlipidemia    Hypertension    Knee pain    Neuropathy    PERIPHERAL    Osteoporosis    PMR (polymyalgia rheumatica) ([facility])    Polymyalgia ([place]) 5 YEARS   Past Surgical History:  Procedure Laterality Date   CARPAL TUNNEL RELEASE     CATARACT EXTRACTION     right   COLONOSCOPY  2007   INGUINAL HERNIA REPAIR  10/14/2011   Procedure: HERNIA REPAIR INGUINAL ADULT;  Surgeon: [name], MD;   Location: [facility];  Service: General;  Laterality: Right;  Right Inguinal Hernia Repair with Mesh   TONSILLECTOMY     VARICOSE VEIN SURGERY     Social History   Socioeconomic History   Marital status: Widowed    Spouse name: Not on file   Number of children: Not on file   Years of education: Not on file   Highest education level: Not on file  Occupational History   Occupation: retired    Fish farm manager: RETIRED  Tobacco Use   Smoking status: Never   Smokeless tobacco: Never  Vaping Use   Vaping Use: Never used  Substance and Sexual Activity   Alcohol use: Yes    Alcohol/week: 3.0 standard drinks    Types: 3 Glasses of wine per week    Comment: half glass wine before dinner   Drug use: No   Sexual activity: Yes  Other Topics Concern   Not on file  Social History Narrative   Not on file   Social Determinants of Health   Financial Resource Strain: Low Risk    Difficulty of Paying Living Expenses: Not hard at all  Food Insecurity: No Food Insecurity   Worried About Charity fundraiser in the Last Year: Never true   [place] in the Last Year: Never true  Transportation Needs: No Transportation Needs   Lack of Transportation (Medical): No   Lack of Transportation (  Non-Medical): No  Physical Activity: Sufficiently Active   Days of Exercise per Week: 7 days   Minutes of Exercise per Session: 30 min  Stress: No Stress Concern Present   Feeling of Stress : Not at all  Social Connections: Moderately Isolated   Frequency of Communication with Friends and Family: Twice a week   Frequency of Social Gatherings with Friends and Family: More than three times a week   Attends Religious Services: More than 4 times per year   Active Member of [employer] or Organizations: No   Attends Archivist Meetings: Never   Marital Status: Widowed   Allergies  Allergen Reactions   Statins Other (See Comments)    Muscle pain and upset stomach   Family History  Problem Relation Age of  Onset   Heart disease Father    Coronary artery disease Other      Current Outpatient Medications (Cardiovascular):    amLODipine (NORVASC) 5 MG tablet, TAKE 1 TABLET DAILY   benazepril-hydrochlorthiazide (LOTENSIN HCT) 10-12.5 MG tablet, TAKE 1 TABLET DAILY     Current Outpatient Medications (Other):    famotidine (PEPCID) 20 MG tablet, TAKE 1 TABLET AT BEDTIME   finasteride (PROSCAR) 5 MG tablet, TAKE 1 TABLET EACH DAY.   gabapentin (NEURONTIN) 100 MG capsule, TAKE 1 CAPSULE 4 TIMES     DAILY   magnesium oxide (MAG-OX) 400 MG tablet, Take 400 mg by mouth daily.   polyethylene glycol (MIRALAX / GLYCOLAX) packet, Take 17 g by mouth daily as needed for moderate constipation.   triamcinolone ointment (KENALOG) 0.1 %, Apply 1 application topically 2 (two) times daily. To affected area   vitamin C (ASCORBIC ACID) 500 MG tablet, Take 500 mg by mouth daily.   VITAMIN D PO, Take by mouth.   Reviewed prior external information including notes and imaging from  primary care provider As well as notes that were available from care everywhere and other healthcare systems.  Past medical history, social, surgical and family history all reviewed in electronic medical record.  No pertanent information unless stated regarding to the chief complaint.   Review of Systems:  No headache, visual changes, nausea, vomiting, diarrhea, constipation, dizziness, abdominal pain, skin rash, fevers, chills, night sweats, weight loss, swollen lymph nodes, body aches, joint swelling, chest pain, shortness of breath, mood changes. POSITIVE muscle aches  Objective  Blood pressure 124/60, pulse 87, height 6\' 1"  (1.854 m), weight 198 lb (89.8 kg), SpO2 98 %.   General: No apparent distress alert and oriented x3 mood and affect normal, dressed appropriately.  HEENT: Pupils equal, extraocular movements intact  Respiratory: Patient's speak in full sentences and does not appear short of breath  Cardiovascular: No  lower extremity edema, non tender, no erythema  Severely antalgic still using the aid of his walker.  Significant arthritic changes of multiple joints.  Severe tenderness over the left sacroiliac joint.  Limited range of motion of the left lower extremity.  Severe instability of the right knee with arthritic changes and effusion noted.  Patient has only 90 degrees of flexion. Patient also has trigger finger noted of the left ring finger.  Tender to palpation at the A2 pulley.  After informed written and verbal consent, patient was seated on exam table. Right knee was prepped with alcohol swab and utilizing anterolateral approach, patient's right knee space was injected with 4:1  marcaine 0.5%: Kenalog 40mg /dL. Patient tolerated the procedure well without immediate complications.  After verbal consent patient was prepped with  alcohol swab and with a 21-gauge 2 inch needle injected into the left sacroiliac joint region.  No blood loss.  A total of 2 cc of 0.5% Marcaine and 1 cc of Kenalog 40 mg/mL used.  Band-Aid placed.  Postinjection instructions given.  Patient was able to stand upright immediately better.  Procedure: Real-time Ultrasound Guided Injection of left fourth finger flexor tendon sheath Device: GE Logiq Q7 Ultrasound guided injection is preferred based studies that show increased duration, increased effect, greater accuracy, decreased procedural pain, increased response rate, and decreased cost with ultrasound guided versus blind injection.  Verbal informed consent obtained.  Time-out conducted.  Noted no overlying erythema, induration, or other signs of local infection.  Skin prepped in a sterile fashion.  Local anesthesia: Topical Ethyl chloride.  With sterile technique and under real time ultrasound guidance: With a 25-gauge half inch needle injecting 0.5 cc of 0.5% Marcaine and 0.5 cc of Kenalog 40 mg/mL. Completed without difficulty  Pain immediately resolved suggesting accurate  placement of the medication.  Advised to call if fevers/chills, erythema, induration, drainage, or persistent bleeding.  Impression: Technically successful ultrasound guided injection.    Impression and Recommendations:     The above documentation has been reviewed and is accurate and complete Lyndal Pulley, DO

## 2021-04-24 ENCOUNTER — Other Ambulatory Visit: Payer: Self-pay

## 2021-04-24 ENCOUNTER — Ambulatory Visit: Payer: Self-pay

## 2021-04-24 ENCOUNTER — Ambulatory Visit (INDEPENDENT_AMBULATORY_CARE_PROVIDER_SITE_OTHER): Payer: Medicare Other | Admitting: Family Medicine

## 2021-04-24 VITALS — BP 124/60 | HR 87 | Ht 73.0 in | Wt 198.0 lb

## 2021-04-24 DIAGNOSIS — N400 Enlarged prostate without lower urinary tract symptoms: Secondary | ICD-10-CM | POA: Diagnosis not present

## 2021-04-24 DIAGNOSIS — G8929 Other chronic pain: Secondary | ICD-10-CM

## 2021-04-24 DIAGNOSIS — I10 Essential (primary) hypertension: Secondary | ICD-10-CM

## 2021-04-24 DIAGNOSIS — M533 Sacrococcygeal disorders, not elsewhere classified: Secondary | ICD-10-CM

## 2021-04-24 DIAGNOSIS — M1711 Unilateral primary osteoarthritis, right knee: Secondary | ICD-10-CM | POA: Diagnosis not present

## 2021-04-24 DIAGNOSIS — M65342 Trigger finger, left ring finger: Secondary | ICD-10-CM

## 2021-04-24 NOTE — Patient Instructions (Signed)
Injection today Lots of sticks hopefully lots of help See you again in 6 weeks

## 2021-04-24 NOTE — Assessment & Plan Note (Signed)
Chronic problem with exacerbation.  Discussed icing regimen and home exercises, discussed which activities to do which wants to avoid.  Wear the brace for stability.  Worsening pain can consider the possibility of repeating viscosupplementation or even with Toradol injections intra-articular.  Follow-up again in 6 weeks

## 2021-04-24 NOTE — Assessment & Plan Note (Signed)
Hopefully patient responds well. Not as long as we would like.  Discussed posture and ergonomics and splinting at night.  Follow-up again in 6 to 8 weeks

## 2021-04-24 NOTE — Assessment & Plan Note (Signed)
Chronic with exacerbation.  Continues to have discomfort and pain.  Discussed with patient about the gabapentin.  He still does not want to go up on the dosing of this.  Patient does feel the injections are helpful.  We seem to do well or better with this as well as a greater trochanteric injection in the epidurals of the back.  Follow-up with me again in 6 weeks

## 2021-04-27 ENCOUNTER — Encounter: Payer: Self-pay | Admitting: Podiatry

## 2021-04-27 ENCOUNTER — Other Ambulatory Visit: Payer: Self-pay

## 2021-04-27 ENCOUNTER — Ambulatory Visit (INDEPENDENT_AMBULATORY_CARE_PROVIDER_SITE_OTHER): Payer: Medicare Other | Admitting: Podiatry

## 2021-04-27 DIAGNOSIS — L84 Corns and callosities: Secondary | ICD-10-CM | POA: Insufficient documentation

## 2021-04-27 DIAGNOSIS — B351 Tinea unguium: Secondary | ICD-10-CM | POA: Diagnosis not present

## 2021-04-27 DIAGNOSIS — G609 Hereditary and idiopathic neuropathy, unspecified: Secondary | ICD-10-CM

## 2021-04-27 DIAGNOSIS — M79675 Pain in left toe(s): Secondary | ICD-10-CM

## 2021-04-27 DIAGNOSIS — M79674 Pain in right toe(s): Secondary | ICD-10-CM | POA: Diagnosis not present

## 2021-04-27 NOTE — Progress Notes (Signed)
This patient returns to my office for at risk foot care.  This patient requires this care by a professional since this patient will be at risk due to having idiopathic neuropathy.  This patient is unable to cut nails himself since the patient cannot reach his nails.These nails are painful walking and wearing shoes.  This patient presents for at risk foot care today.  General Appearance  Alert, conversant and in no acute stress.  Vascular  Dorsalis pedis and posterior tibial  pulses are weaklabsent   palpable  bilaterally.  Capillary return is within normal limits  Bilaterally.Cold feet.  bilaterally.  Neurologic  Senn-Weinstein monofilament wire test within normal limits  bilaterally. Muscle power within normal limits bilaterally.  Nails Thick disfigured discolored nails with subungual debris  from hallux to fifth toes bilaterally. Pincer nails  B/L. No evidence of bacterial infection or drainage bilaterally.  Orthopedic  No limitations of motion  feet .  No crepitus or effusions noted.  No bony pathology or digital deformities noted.  Skin  normotropic skin with no porokeratosis noted bilaterally.  No signs of infections or ulcers noted.  Callus 5th metabase left foot.   Onychomycosis  Pain in right toes  Pain in left toes  Callus left foot.  Consent was obtained for treatment procedures.   Mechanical debridement of nails 1-5  bilaterally performed with a nail nipper.  Filed with dremel without incident. Debride callus with # 15 blade and dremel tool.   Return office visit   3 months                  Told patient to return for periodic foot care and evaluation due to potential at risk complications.   Gardiner Barefoot DPM

## 2021-05-08 ENCOUNTER — Telehealth: Payer: Medicare Other | Admitting: Family Medicine

## 2021-05-11 ENCOUNTER — Other Ambulatory Visit: Payer: Self-pay | Admitting: Family Medicine

## 2021-05-14 ENCOUNTER — Encounter: Payer: Self-pay | Admitting: Family Medicine

## 2021-05-14 ENCOUNTER — Telehealth (INDEPENDENT_AMBULATORY_CARE_PROVIDER_SITE_OTHER): Payer: Medicare Other | Admitting: Family Medicine

## 2021-05-14 VITALS — Ht 73.0 in | Wt 188.0 lb

## 2021-05-14 DIAGNOSIS — R040 Epistaxis: Secondary | ICD-10-CM | POA: Diagnosis not present

## 2021-05-14 NOTE — Progress Notes (Signed)
Patient ID: Darin [name], male   DOB: 17-Jul-1931, 88 y.o.   MRN: [id number]   This visit type was conducted due to national recommendations for restrictions regarding the COVID-19 pandemic in an effort to limit this patient's exposure and mitigate transmission in our community.   Virtual Visit via Telephone Note  I connected with Darin [name] on 05/14/21 at  1:45 PM EST by telephone and verified that I am speaking with the correct person using two identifiers.   I discussed the limitations, risks, security and privacy concerns of performing an evaluation and management service by telephone and the availability of in person appointments. I also discussed with the patient that there may be a patient responsible charge related to this service. The patient expressed understanding and agreed to proceed.  Location patient: home Location provider: work or home office Participants present for the call: patient, provider Patient did not have a visit in the prior 7 days to address this/these issue(s).   History of Present Illness:  Patient relates intermittent symptoms over the past month of what he initially termed recurrent "cold "symptoms.  He has some intermittent sore throat.  Occasional mild nosebleeds.  Occasional scab-like discharge from his nose.  He apparently has been taking aspirin twice daily.  Denies any fever.  No upper teeth pain.  He feels the nosebleed is most likely from dryness.  No cough.  Past Medical History:  Diagnosis Date   Arthritis    GERD (gastroesophageal reflux disease)    H/O hiatal hernia    "FOR YEARS" " NO PROBLEM"   Hyperlipidemia    Hypertension    Knee pain    Neuropathy    PERIPHERAL    Osteoporosis    PMR (polymyalgia rheumatica) (HCC)    Polymyalgia ([place]) 5 YEARS   Past Surgical History:  Procedure Laterality Date   CARPAL TUNNEL RELEASE     CATARACT EXTRACTION     right   COLONOSCOPY  2007   INGUINAL HERNIA REPAIR  10/14/2011   Procedure:  HERNIA REPAIR INGUINAL ADULT;  Surgeon: [name], MD;  Location: [facility];  Service: General;  Laterality: Right;  Right Inguinal Hernia Repair with Mesh   TONSILLECTOMY     VARICOSE VEIN SURGERY      reports that he has never smoked. He has never used smokeless tobacco. He reports current alcohol use of about 3.0 standard drinks per week. He reports that he does not use drugs. family history includes Coronary artery disease in an other family member; Heart disease in his father. Allergies  Allergen Reactions   Statins Other (See Comments)    Muscle pain and upset stomach      Observations/Objective: Patient sounds cheerful and well on the phone. I do not appreciate any SOB. Speech and thought processing are grossly intact. Patient reported vitals:  Assessment and Plan:  Patient relates intermittent nosebleeds and intermittent sinus and nasal symptoms as above.  We suspect this may be partly related dry air.  He is also taking aspirin up to twice daily and we strongly advised that he stop that.  We made the following recommendations  -Stop all aspirin use -Consider humidifier for nighttime use in bedroom -Stay well-hydrated -Consider nighttime use of nasal saline -Reviewed signs and symptoms of sinus infection to watch out for such as facial pain, progressive headaches, purulent nasal discharge, etc.  Follow Up Instructions:   [id number] 5-10 [id number] 11-20 [id number] 21-30 I did not refer this patient for an OV in  the next 24 hours for this/these issue(s).  I discussed the assessment and treatment plan with the patient. The patient was provided an opportunity to ask questions and all were answered. The patient agreed with the plan and demonstrated an understanding of the instructions.   The patient was advised to call back or seek an in-person evaluation if the symptoms worsen or if the condition fails to improve as anticipated.  I provided 14 minutes of non-face-to-face time  during this encounter.   [name], MD

## 2021-05-20 ENCOUNTER — Other Ambulatory Visit: Payer: Self-pay

## 2021-05-20 ENCOUNTER — Encounter (HOSPITAL_COMMUNITY): Payer: Self-pay | Admitting: Internal Medicine

## 2021-05-20 ENCOUNTER — Inpatient Hospital Stay (HOSPITAL_COMMUNITY)
Admission: EM | Admit: 2021-05-20 | Discharge: 2021-05-24 | DRG: 493 | Disposition: A | Discharge status: Skilled Nursing Facility | Payer: Medicare Other | Attending: Internal Medicine | Admitting: Internal Medicine

## 2021-05-20 ENCOUNTER — Emergency Department (HOSPITAL_COMMUNITY): Payer: Medicare Other

## 2021-05-20 DIAGNOSIS — I6523 Occlusion and stenosis of bilateral carotid arteries: Secondary | ICD-10-CM | POA: Diagnosis present

## 2021-05-20 DIAGNOSIS — Z8249 Family history of ischemic heart disease and other diseases of the circulatory system: Secondary | ICD-10-CM | POA: Diagnosis not present

## 2021-05-20 DIAGNOSIS — Z888 Allergy status to other drugs, medicaments and biological substances status: Secondary | ICD-10-CM

## 2021-05-20 DIAGNOSIS — W19XXXA Unspecified fall, initial encounter: Secondary | ICD-10-CM

## 2021-05-20 DIAGNOSIS — W1830XA Fall on same level, unspecified, initial encounter: Secondary | ICD-10-CM | POA: Diagnosis present

## 2021-05-20 DIAGNOSIS — K5731 Diverticulosis of large intestine without perforation or abscess with bleeding: Secondary | ICD-10-CM | POA: Diagnosis not present

## 2021-05-20 DIAGNOSIS — R531 Weakness: Secondary | ICD-10-CM | POA: Diagnosis not present

## 2021-05-20 DIAGNOSIS — D638 Anemia in other chronic diseases classified elsewhere: Secondary | ICD-10-CM | POA: Diagnosis not present

## 2021-05-20 DIAGNOSIS — D62 Acute posthemorrhagic anemia: Secondary | ICD-10-CM | POA: Diagnosis not present

## 2021-05-20 DIAGNOSIS — I951 Orthostatic hypotension: Secondary | ICD-10-CM | POA: Diagnosis present

## 2021-05-20 DIAGNOSIS — M25561 Pain in right knee: Secondary | ICD-10-CM | POA: Diagnosis not present

## 2021-05-20 DIAGNOSIS — J449 Chronic obstructive pulmonary disease, unspecified: Secondary | ICD-10-CM | POA: Diagnosis present

## 2021-05-20 DIAGNOSIS — M898X9 Other specified disorders of bone, unspecified site: Secondary | ICD-10-CM | POA: Diagnosis present

## 2021-05-20 DIAGNOSIS — Z79899 Other long term (current) drug therapy: Secondary | ICD-10-CM | POA: Diagnosis not present

## 2021-05-20 DIAGNOSIS — R55 Syncope and collapse: Principal | ICD-10-CM

## 2021-05-20 DIAGNOSIS — K219 Gastro-esophageal reflux disease without esophagitis: Secondary | ICD-10-CM | POA: Diagnosis present

## 2021-05-20 DIAGNOSIS — S82101A Unspecified fracture of upper end of right tibia, initial encounter for closed fracture: Principal | ICD-10-CM | POA: Diagnosis present

## 2021-05-20 DIAGNOSIS — K5900 Constipation, unspecified: Secondary | ICD-10-CM | POA: Diagnosis not present

## 2021-05-20 DIAGNOSIS — S82231A Displaced oblique fracture of shaft of right tibia, initial encounter for closed fracture: Secondary | ICD-10-CM | POA: Diagnosis not present

## 2021-05-20 DIAGNOSIS — T148XXA Other injury of unspecified body region, initial encounter: Secondary | ICD-10-CM

## 2021-05-20 DIAGNOSIS — N400 Enlarged prostate without lower urinary tract symptoms: Secondary | ICD-10-CM | POA: Diagnosis present

## 2021-05-20 DIAGNOSIS — M720 Palmar fascial fibromatosis [Dupuytren]: Secondary | ICD-10-CM | POA: Diagnosis present

## 2021-05-20 DIAGNOSIS — I10 Essential (primary) hypertension: Secondary | ICD-10-CM | POA: Diagnosis present

## 2021-05-20 DIAGNOSIS — R609 Edema, unspecified: Secondary | ICD-10-CM | POA: Diagnosis not present

## 2021-05-20 DIAGNOSIS — M1711 Unilateral primary osteoarthritis, right knee: Secondary | ICD-10-CM | POA: Diagnosis not present

## 2021-05-20 DIAGNOSIS — S82831A Other fracture of upper and lower end of right fibula, initial encounter for closed fracture: Secondary | ICD-10-CM | POA: Diagnosis not present

## 2021-05-20 DIAGNOSIS — R2681 Unsteadiness on feet: Secondary | ICD-10-CM | POA: Diagnosis not present

## 2021-05-20 DIAGNOSIS — S82401A Unspecified fracture of shaft of right fibula, initial encounter for closed fracture: Secondary | ICD-10-CM | POA: Diagnosis not present

## 2021-05-20 DIAGNOSIS — M1990 Unspecified osteoarthritis, unspecified site: Secondary | ICD-10-CM | POA: Diagnosis present

## 2021-05-20 DIAGNOSIS — M353 Polymyalgia rheumatica: Secondary | ICD-10-CM | POA: Diagnosis present

## 2021-05-20 DIAGNOSIS — M24561 Contracture, right knee: Secondary | ICD-10-CM | POA: Diagnosis not present

## 2021-05-20 DIAGNOSIS — M7989 Other specified soft tissue disorders: Secondary | ICD-10-CM | POA: Diagnosis not present

## 2021-05-20 DIAGNOSIS — S82201A Unspecified fracture of shaft of right tibia, initial encounter for closed fracture: Secondary | ICD-10-CM | POA: Diagnosis not present

## 2021-05-20 DIAGNOSIS — M810 Age-related osteoporosis without current pathological fracture: Secondary | ICD-10-CM | POA: Diagnosis present

## 2021-05-20 DIAGNOSIS — Z01818 Encounter for other preprocedural examination: Secondary | ICD-10-CM | POA: Diagnosis not present

## 2021-05-20 DIAGNOSIS — L84 Corns and callosities: Secondary | ICD-10-CM | POA: Diagnosis not present

## 2021-05-20 DIAGNOSIS — Z7401 Bed confinement status: Secondary | ICD-10-CM | POA: Diagnosis not present

## 2021-05-20 DIAGNOSIS — E785 Hyperlipidemia, unspecified: Secondary | ICD-10-CM | POA: Diagnosis present

## 2021-05-20 DIAGNOSIS — R0902 Hypoxemia: Secondary | ICD-10-CM | POA: Diagnosis not present

## 2021-05-20 DIAGNOSIS — Z20822 Contact with and (suspected) exposure to covid-19: Secondary | ICD-10-CM | POA: Diagnosis present

## 2021-05-20 DIAGNOSIS — M79604 Pain in right leg: Secondary | ICD-10-CM | POA: Diagnosis not present

## 2021-05-20 DIAGNOSIS — Z419 Encounter for procedure for purposes other than remedying health state, unspecified: Secondary | ICD-10-CM

## 2021-05-20 DIAGNOSIS — E871 Hypo-osmolality and hyponatremia: Secondary | ICD-10-CM | POA: Diagnosis not present

## 2021-05-20 DIAGNOSIS — S82201D Unspecified fracture of shaft of right tibia, subsequent encounter for closed fracture with routine healing: Secondary | ICD-10-CM | POA: Diagnosis not present

## 2021-05-20 DIAGNOSIS — G629 Polyneuropathy, unspecified: Secondary | ICD-10-CM | POA: Diagnosis present

## 2021-05-20 LAB — CBC
HCT: 39.1 % (ref 39.0–52.0)
Hemoglobin: 13.2 g/dL (ref 13.0–17.0)
MCH: 31.7 pg (ref 26.0–34.0)
MCHC: 33.8 g/dL (ref 30.0–36.0)
MCV: 93.8 fL (ref 80.0–100.0)
Platelets: 184 10*3/uL (ref 150–400)
RBC: 4.17 MIL/uL — ABNORMAL LOW (ref 4.22–5.81)
RDW: 13.2 % (ref 11.5–15.5)
WBC: 15.2 10*3/uL — ABNORMAL HIGH (ref 4.0–10.5)
nRBC: 0 % (ref 0.0–0.2)

## 2021-05-20 LAB — URINALYSIS, ROUTINE W REFLEX MICROSCOPIC
Bilirubin Urine: NEGATIVE
Glucose, UA: NEGATIVE mg/dL
Hgb urine dipstick: NEGATIVE
Ketones, ur: 20 mg/dL — AB
Leukocytes,Ua: NEGATIVE
Nitrite: NEGATIVE
Protein, ur: NEGATIVE mg/dL
Specific Gravity, Urine: 1.016 (ref 1.005–1.030)
pH: 7 (ref 5.0–8.0)

## 2021-05-20 LAB — CBG MONITORING, ED: Glucose-Capillary: 140 mg/dL — ABNORMAL HIGH (ref 70–99)

## 2021-05-20 LAB — BASIC METABOLIC PANEL
Anion gap: 6 (ref 5–15)
BUN: 21 mg/dL (ref 8–23)
CO2: 29 mmol/L (ref 22–32)
Calcium: 9.2 mg/dL (ref 8.9–10.3)
Chloride: 100 mmol/L (ref 98–111)
Creatinine, Ser: 0.8 mg/dL (ref 0.61–1.24)
GFR, Estimated: >60 mL/min (ref >60)
Glucose, Bld: 138 mg/dL — ABNORMAL HIGH (ref 70–99)
Potassium: 3.7 mmol/L (ref 3.5–5.1)
Sodium: 135 mmol/L (ref 135–145)

## 2021-05-20 LAB — HEPATIC FUNCTION PANEL
ALT: 22 U/L (ref 0–44)
AST: 30 U/L (ref 15–41)
Albumin: 2.7 g/dL — ABNORMAL LOW (ref 3.5–5.0)
Alkaline Phosphatase: 45 U/L (ref 38–126)
Bilirubin, Direct: 0.1 mg/dL (ref 0.0–0.2)
Indirect Bilirubin: 0.4 mg/dL (ref 0.3–0.9)
Total Bilirubin: 0.5 mg/dL (ref 0.3–1.2)
Total Protein: 5.7 g/dL — ABNORMAL LOW (ref 6.5–8.1)

## 2021-05-20 LAB — I-STAT CHEM 8, ED
BUN: 22 mg/dL (ref 8–23)
Calcium, Ion: 1.27 mmol/L (ref 1.15–1.40)
Chloride: 99 mmol/L (ref 98–111)
Creatinine, Ser: 0.8 mg/dL (ref 0.61–1.24)
Glucose, Bld: 133 mg/dL — ABNORMAL HIGH (ref 70–99)
HCT: 39 % (ref 39.0–52.0)
Hemoglobin: 13.3 g/dL (ref 13.0–17.0)
Potassium: 3.8 mmol/L (ref 3.5–5.1)
Sodium: 135 mmol/L (ref 135–145)
TCO2: 32 mmol/L (ref 22–32)

## 2021-05-20 LAB — RESP PANEL BY RT-PCR (FLU A&B, COVID) ARPGX2
Influenza A by PCR: NEGATIVE
Influenza B by PCR: NEGATIVE
SARS Coronavirus 2 by RT PCR: NEGATIVE

## 2021-05-20 LAB — MAGNESIUM: Magnesium: 2 mg/dL (ref 1.7–2.4)

## 2021-05-20 MED ORDER — SODIUM CHLORIDE 0.9% FLUSH
3.0000 mL | Freq: Two times a day (BID) | INTRAVENOUS | Status: DC
  Administered 2021-05-20 – 2021-05-24 (×6): 3 mL via INTRAVENOUS

## 2021-05-20 MED ORDER — CEFAZOLIN SODIUM-DEXTROSE 1-4 GM/50ML-% IV SOLN
1.0000 g | Freq: Once | INTRAVENOUS | Status: AC
  Administered 2021-05-20: 1 g via INTRAVENOUS
  Filled 2021-05-20: qty 50

## 2021-05-20 MED ORDER — MAGNESIUM OXIDE -MG SUPPLEMENT 400 (240 MG) MG PO TABS
400.0000 mg | ORAL_TABLET | Freq: Every day | ORAL | Status: DC
  Administered 2021-05-21 – 2021-05-24 (×4): 400 mg via ORAL
  Filled 2021-05-20 (×4): qty 1

## 2021-05-20 MED ORDER — BENAZEPRIL-HYDROCHLOROTHIAZIDE 10-12.5 MG PO TABS: 1.0000 | ORAL_TABLET | Freq: Every day | ORAL | Status: DC

## 2021-05-20 MED ORDER — FINASTERIDE 5 MG PO TABS
5.0000 mg | ORAL_TABLET | Freq: Every day | ORAL | Status: DC
  Administered 2021-05-21 – 2021-05-24 (×4): 5 mg via ORAL
  Filled 2021-05-20 (×4): qty 1

## 2021-05-20 MED ORDER — GABAPENTIN 100 MG PO CAPS
100.0000 mg | ORAL_CAPSULE | Freq: Four times a day (QID) | ORAL | Status: DC
Start: 2021-05-20 — End: 2021-05-24
  Administered 2021-05-20 – 2021-05-24 (×13): 100 mg via ORAL
  Filled 2021-05-20 (×13): qty 1

## 2021-05-20 MED ORDER — ACETAMINOPHEN 325 MG PO TABS
650.0000 mg | ORAL_TABLET | Freq: Four times a day (QID) | ORAL | Status: DC | PRN
  Filled 2021-05-20: qty 2

## 2021-05-20 MED ORDER — POLYETHYLENE GLYCOL 3350 17 G PO PACK
17.0000 g | PACK | Freq: Two times a day (BID) | ORAL | Status: DC
  Administered 2021-05-20 – 2021-05-24 (×7): 17 g via ORAL
  Filled 2021-05-20 (×7): qty 1

## 2021-05-20 MED ORDER — VITAMIN D 25 MCG (1000 UNIT) PO TABS
1000.0000 [IU] | ORAL_TABLET | Freq: Every day | ORAL | Status: DC
  Administered 2021-05-20 – 2021-05-23 (×4): 1000 [IU] via ORAL
  Filled 2021-05-20 (×4): qty 1

## 2021-05-20 MED ORDER — AMLODIPINE BESYLATE 5 MG PO TABS
5.0000 mg | ORAL_TABLET | Freq: Every day | ORAL | Status: DC
Start: 2021-05-20 — End: 2021-05-24
  Administered 2021-05-20 – 2021-05-23 (×4): 5 mg via ORAL
  Filled 2021-05-20 (×4): qty 1

## 2021-05-20 MED ORDER — BENAZEPRIL HCL 20 MG PO TABS
10.0000 mg | ORAL_TABLET | Freq: Every day | ORAL | Status: DC
  Administered 2021-05-21: 10 mg via ORAL
  Filled 2021-05-20: qty 1

## 2021-05-20 MED ORDER — OXYCODONE HCL 5 MG PO TABS
5.0000 mg | ORAL_TABLET | Freq: Four times a day (QID) | ORAL | Status: DC | PRN
  Administered 2021-05-21 – 2021-05-22 (×2): 5 mg via ORAL
  Filled 2021-05-20 (×2): qty 1

## 2021-05-20 MED ORDER — PANTOPRAZOLE SODIUM 40 MG PO TBEC
40.0000 mg | DELAYED_RELEASE_TABLET | Freq: Every day | ORAL | Status: DC
Start: 2021-05-20 — End: 2021-05-24
  Administered 2021-05-20 – 2021-05-24 (×5): 40 mg via ORAL
  Filled 2021-05-20 (×5): qty 1

## 2021-05-20 MED ORDER — FAMOTIDINE 20 MG PO TABS
20.0000 mg | ORAL_TABLET | Freq: Every day | ORAL | Status: DC
  Administered 2021-05-20 – 2021-05-23 (×4): 20 mg via ORAL
  Filled 2021-05-20 (×4): qty 1

## 2021-05-20 MED ORDER — FENTANYL CITRATE PF 50 MCG/ML IJ SOSY
50.0000 ug | PREFILLED_SYRINGE | INTRAMUSCULAR | Status: DC | PRN
  Administered 2021-05-23: 50 ug via INTRAVENOUS
  Filled 2021-05-20: qty 1

## 2021-05-20 MED ORDER — MUPIROCIN 2 % EX OINT
1.0000 "application " | TOPICAL_OINTMENT | Freq: Two times a day (BID) | CUTANEOUS | Status: DC
  Administered 2021-05-22 – 2021-05-24 (×4): 1 via NASAL
  Filled 2021-05-20 (×3): qty 22

## 2021-05-20 MED ORDER — ACETAMINOPHEN 650 MG RE SUPP: 650.0000 mg | Freq: Four times a day (QID) | RECTAL | Status: DC | PRN

## 2021-05-20 MED ORDER — HYDROCHLOROTHIAZIDE 12.5 MG PO TABS
12.5000 mg | ORAL_TABLET | Freq: Every day | ORAL | Status: DC
  Filled 2021-05-20: qty 1

## 2021-05-20 MED ORDER — ASCORBIC ACID 500 MG PO TABS
500.0000 mg | ORAL_TABLET | Freq: Every day | ORAL | Status: DC
  Administered 2021-05-21 – 2021-05-24 (×4): 500 mg via ORAL
  Filled 2021-05-20 (×4): qty 1

## 2021-05-20 NOTE — H&P (Signed)
History and Physical    Patient: Darin [name] EHU:[id number] DOB: 10/18/1931 DOA: 05/20/2021 DOS: the patient was seen and examined on 05/20/2021 PCP: [name], MD  Patient coming from: Home  Chief Complaint:  Chief Complaint  Patient presents with   Loss of Consciousness   HPI: Darin [name] is a 88 y.o. male with medical history significant of arthritis, polymyalgia rheumatica, right Dupuytren's contracture, GERD, hiatal hernia, hyperlipidemia, hypertension, peripheral neuropathy, osteoporosis who was brought to the emergency department after having a syncopal episode while trying to get up from the toilet at home.  He sustained a small laceration on his right shin, small laceration/skin tear in the left wrist and right knee/ankle area.  He denied any prodromal symptoms like chest pain, dyspnea, palpitations, nausea or diaphoresis.  He has not had any headache, blurred vision, sore throat, rhinorrhea, productive cough, wheezing or hemoptysis.  He has been constipated, but denied abdominal pain, diarrhea, melena or hematochezia.  No dysuria, flank pain or hematuria.  No polyuria, polydipsia, polyphagia or blurred vision.  ED course: Initial vital signs were temperature 98.4 F, pulse 100, respiration 17, BP 134/74 mmHg and O2 sat 97% on room air.  The patient received Ancef 1 g IVPB and fentanyl 50 mcg IVP.  Orthopedic surgery was consulted and requested transfer to [facility].  Lab work: CBC showed a white count of 15.2, hemoglobin 13.2 g/dL and platelets 184.  BMP with a glucose of 138 mg/dL, but are all other measurements were unremarkable.  Imaging: Chest radiograph show aortic atherosclerotic calcification with normal heart size/mediastinal contours and clear lungs.  Right knee/tibia/fibula x-ray show displaced fractures of the proximal tibial diaphysis and proximal fibular metaphysis.  Review of Systems: As mentioned in the history of present illness. All other systems  reviewed and are negative. Past Medical History:  Diagnosis Date   Arthritis    DUPUYTREN'S CONTRACTURE, RIGHT 07/04/2009   Annotation: 4th digit Qualifier: Diagnosis of  By: Arnoldo Morale MD, Balinda Quails    GERD (gastroesophageal reflux disease)    H/O hiatal hernia    "FOR YEARS" " NO PROBLEM"   Hyperlipidemia    Hypertension    Knee pain    Neuropathy    PERIPHERAL    Osteoporosis    PMR (polymyalgia rheumatica) (HCC)    Polymyalgia ([place]) 5 YEARS   Past Surgical History:  Procedure Laterality Date   CARPAL TUNNEL RELEASE     CATARACT EXTRACTION     right   COLONOSCOPY  2007   INGUINAL HERNIA REPAIR  10/14/2011   Procedure: HERNIA REPAIR INGUINAL ADULT;  Surgeon: [name], MD;  Location: [facility];  Service: General;  Laterality: Right;  Right Inguinal Hernia Repair with Mesh   TONSILLECTOMY     VARICOSE VEIN SURGERY     Social History:  reports that he has never smoked. He has never used smokeless tobacco. He reports current alcohol use of about 3.0 standard drinks per week. He reports that he does not use drugs.  Allergies  Allergen Reactions   Statins Other (See Comments)    Muscle pain and upset stomach    Family History  Problem Relation Age of Onset   Heart disease Father    Coronary artery disease Other     Prior to Admission medications   Medication Sig Start Date End Date Taking? Authorizing Provider  amLODipine (NORVASC) 5 MG tablet TAKE 1 TABLET DAILY 01/05/21   Burchette, Alinda Sierras, MD  benazepril-hydrochlorthiazide (LOTENSIN HCT) 10-12.5 MG  tablet TAKE 1 TABLET DAILY 05/11/21   [name], MD  famotidine (PEPCID) 20 MG tablet TAKE 1 TABLET AT BEDTIME 04/18/21   Burchette, Alinda Sierras, MD  finasteride (PROSCAR) 5 MG tablet TAKE 1 TABLET EACH DAY. 05/26/20   Burchette, Alinda Sierras, MD  gabapentin (NEURONTIN) 100 MG capsule TAKE 1 CAPSULE 4 TIMES     DAILY 03/06/21   Burchette, Alinda Sierras, MD  magnesium oxide (MAG-OX) 400 MG tablet Take 400 mg by mouth daily.     [provider]  polyethylene glycol (MIRALAX / GLYCOLAX) packet Take 17 g by mouth daily as needed for moderate constipation. 03/16/17   Aline August, MD  triamcinolone ointment (KENALOG) 0.1 % Apply 1 application topically 2 (two) times daily. To affected area 06/14/20   Lyndal Pulley, DO  vitamin C (ASCORBIC ACID) 500 MG tablet Take 500 mg by mouth daily.    [provider]  VITAMIN D PO Take by mouth.    [provider]    Physical Exam: Vitals:   05/20/21 1500 05/20/21 1538 05/20/21 1545 05/20/21 1600  BP: (!) 157/90 (!) 168/65 (!) 164/76 (!) 148/56  Pulse: (!) 101 96 (!) 101 100  Resp: (!) 22 (!) 21 (!) 22 18  Temp:      TempSrc:      SpO2: 97% 98% 97% 97%   Physical Exam Vitals and nursing note reviewed.  Constitutional:      Appearance: Normal appearance. He is normal weight.  HENT:     Head: Normocephalic.     Mouth/Throat:     Mouth: Mucous membranes are moist.  Eyes:     Pupils: Pupils are equal, round, and reactive to light.  Neck:     Vascular: No JVD.  Cardiovascular:     Rate and Rhythm: Normal rate and regular rhythm.  Pulmonary:     Effort: Pulmonary effort is normal.     Breath sounds: Normal breath sounds.  Abdominal:     General: There is no distension.     Palpations: Abdomen is soft.     Tenderness: There is no abdominal tenderness.  Musculoskeletal:     Cervical back: Neck supple.     Right knee: Swelling and deformity present.     Right lower leg: Swelling present.     Left lower leg: No edema.     Comments: Shortened and laterally rotated RLE.  Skin:    General: Skin is warm and dry.  Neurological:     General: No focal deficit present.     Mental Status: He is alert and oriented to person, place, and time.  Psychiatric:        Mood and Affect: Mood normal.        Behavior: Behavior normal.   Data Reviewed:  There are no new results to review at this time.  Assessment and Plan: Principal Problem:    Tibia/fibula fracture, right, closed, initial encounter Admit to inpatient/medical telemetry. N.p.o. after midnight. Analgesics as needed. Antiemetics as needed. Orthopedic surgery to evaluate.  Active Problems:   Syncope Likely vasovagal. Continue cardio monitoring. Check echocardiogram. Check carotid Doppler.    Hyperlipidemia Currently not on medical therapy. Continue lifestyle modifications. Follow-up with PCP.    Essential hypertension Continue amlodipine 5 mg p.o. daily. Continue benazepril 10 mg p.o. daily. Continue HCTZ 12.5 mg p.o. daily.    GERD (gastroesophageal reflux disease) Protonix 40 mg p.o. daily. Continue home famotidine at night.    COPD (chronic obstructive pulmonary  disease) ([place]) Supplemental oxygen and bronchodilators as needed.    BPH (benign prostatic hyperplasia) Continue finasteride 5 mg p.o. daily.    Advance Care Planning:   Code Status: Full Code   Consults: Katha Hamming, MD (orthopedic surgery).  Family Communication:   Severity of Illness: The appropriate patient status for this patient is INPATIENT. Inpatient status is judged to be reasonable and necessary in order to provide the required intensity of service to ensure the patient's safety. The patient's presenting symptoms, physical exam findings, and initial radiographic and laboratory data in the context of their chronic comorbidities is felt to place them at high risk for further clinical deterioration. Furthermore, it is not anticipated that the patient will be medically stable for discharge from the hospital within 2 midnights of admission.   * I certify that at the point of admission it is my clinical judgment that the patient will require inpatient hospital care spanning beyond 2 midnights from the point of admission due to high intensity of service, high risk for further deterioration and high frequency of surveillance required.*  Author: Reubin Milan, MD 05/20/2021 4:48  PM  For on call review [email or web address].   This document was prepared using Dragon voice recognition software and may contain some unintended transcription errors.

## 2021-05-20 NOTE — Consult Note (Signed)
I spoke with Dr. Vanita Panda about this pt. Pt has a proximal tibia fx that will benefit from surgery from one the trauma ortho specialists at [facility].  Recommend admission to [facility] under hospitalist.  Please keep NPO after MN.  Trauma PA will see in am.  I spoke with Dr. Doreatha Martin about this pt.

## 2021-05-20 NOTE — Progress Notes (Signed)
Orthopedic Tech Progress Note Patient Details:  Darin [name] [birth year] [id number]  Ortho Devices Type of Ortho Device: Knee Immobilizer Ortho Device/Splint Location: right Ortho Device/Splint Interventions: Application   Post Interventions Patient Tolerated: Well Instructions Provided: Care of device  [place] Pink 05/20/2021, 4:02 PM

## 2021-05-20 NOTE — ED Triage Notes (Addendum)
Pt BIBA from home.Pt reports getting up from toilet and losing consciousness. Pt was home alone. C/o R leg/ankle pain (6/10), small lac on R shin, and small lac/skin tear on L wrist. No deformity notable on head. C-Collar in place.  Pt reports LOC event 2 months ago.  Aox4 18G in LAC R leg in soft foam splint.  BP: 166/72 RR: 18 SPO2: 99 RA

## 2021-05-20 NOTE — ED Provider Notes (Signed)
[facility] DEPT Provider Note   CSN: [id number] Arrival date & time: 05/20/21  1229     History  Chief Complaint  Patient presents with   Loss of Consciousness    Darin [name] is a 88 y.o. male.  HPI Patient presents with a male companion who assists with history after obtaining consent to include her in the process.  He states he is generally well, was so prior to today's event.  He recalls arising from the commode, using his walker, but feeling lightheaded, and losing consciousness, falling to the ground.  Currently he denies head pain, neck pain, chest pain, abdominal pain.  He does have pain in his right leg, knee, calf.  Pain is severe, though he declines pain medication, both to me and EMS.  No distal loss of sensation or weakness.  He takes no blood thinning medication.  EMS reports no hemodynamic instability in route.    Home Medications Prior to Admission medications   Medication Sig Start Date End Date Taking? Authorizing Provider  amLODipine (NORVASC) 5 MG tablet TAKE 1 TABLET DAILY 01/05/21   Burchette, Alinda Sierras, MD  benazepril-hydrochlorthiazide (LOTENSIN HCT) 10-12.5 MG tablet TAKE 1 TABLET DAILY 05/11/21   Burchette, Alinda Sierras, MD  famotidine (PEPCID) 20 MG tablet TAKE 1 TABLET AT BEDTIME 04/18/21   Burchette, Alinda Sierras, MD  finasteride (PROSCAR) 5 MG tablet TAKE 1 TABLET EACH DAY. 05/26/20   Burchette, Alinda Sierras, MD  gabapentin (NEURONTIN) 100 MG capsule TAKE 1 CAPSULE 4 TIMES     DAILY 03/06/21   Burchette, Alinda Sierras, MD  magnesium oxide (MAG-OX) 400 MG tablet Take 400 mg by mouth daily.    [provider]  polyethylene glycol (MIRALAX / GLYCOLAX) packet Take 17 g by mouth daily as needed for moderate constipation. 03/16/17   Aline August, MD  triamcinolone ointment (KENALOG) 0.1 % Apply 1 application topically 2 (two) times daily. To affected area 06/14/20   Lyndal Pulley, DO  vitamin C (ASCORBIC ACID) 500 MG tablet Take 500 mg by  mouth daily.    [provider]  VITAMIN D PO Take by mouth.    [provider]      Allergies    Statins    Review of Systems   Review of Systems  Constitutional:        Per HPI, otherwise negative  HENT:         Per HPI, otherwise negative  Respiratory:         Per HPI, otherwise negative  Cardiovascular:        Per HPI, otherwise negative  Gastrointestinal:  Negative for vomiting.  Endocrine:       Negative aside from HPI  Genitourinary:        Neg aside from HPI   Musculoskeletal:        Per HPI, otherwise negative  Skin:  Positive for wound.  Neurological:  Positive for syncope.   Physical Exam Updated Vital Signs BP (!) 154/74 (BP Location: Right Arm)    Pulse 100    Temp 98.4 F (36.9 C) (Oral)    Resp 17    SpO2 98%  Physical Exam Musculoskeletal:       Arms:     Cervical back: Full passive range of motion without pain and neck supple. No spinous process tenderness or muscular tenderness.       Legs:  Neurological:     Motor: No weakness.     Comments: Unremarkable  neuro exam, preserved distal neurovascular status/capacity in all 4 extremities, face is symmetric, speech is clear    ED Results / Procedures / Treatments   Labs (all labs ordered are listed, but only abnormal results are displayed) Labs Reviewed  CBG MONITORING, ED - Abnormal; Notable for the following components:      Result Value   Glucose-Capillary 140 (*)    All other components within normal limits  BASIC METABOLIC PANEL  CBC  URINALYSIS, ROUTINE W REFLEX MICROSCOPIC  I-STAT CHEM 8, ED    EKG None  Radiology No results found.  Procedures Procedures    Medications Ordered in ED Medications  fentaNYL (SUBLIMAZE) injection 50 mcg (has no administration in time range)    ED Course/ Medical Decision Making/ A&P  This patient presents to the ED for concern of syncope and fall, this involves an extensive number of treatment options, and is a complaint that  carries with it a high risk of complications and morbidity.  The differential diagnosis includes arrhythmia, vasovagal, orthostasis, dehydration prior to, with subsequent fracture, soft tissue injury, wound   Co morbidities that complicate the patient evaluation  Age, hypertension   Social Determinants of Health:  Age   Additional history obtained:  Additional history and/or information obtained from friend EMS External records from outside source obtained and reviewed including EMS vital signs as above, and EMS rhythm strip provided, reviewed, sinus tachycardia, rate 101, abnormal   After the initial evaluation, orders, including: Labs, x-ray, EKG were initiated.  Patient placed on Cardiac and Pulse-Oximetry Monitors. The patient was maintained on a cardiac monitor.  The cardiac monitored showed an rhythm of sinus rhythm, 90 unremarkable The patient was also maintained on pulse oximetry. The readings were typically a 99% room air normal  On repeat evaluation of the patient improved  Lab Tests:  I personally interpreted labs.  The pertinent results include: Mild leukocytosis, no anemia, electrolytes unremarkable aside from mild hyperglycemia  Imaging Studies ordered:  I independently visualized and interpreted imaging which showed fracture, displaced, proximal fibula, tibia, with diaphysis disruption.  I demonstrated the picture to the patient and his companion in the room at bedside I agree with the radiologist interpretation  Consultations Obtained:  I requested consultation with the orthopedic and internal medicine colleague,  and discussed lab and imaging findings as well as pertinent plan - they recommend: Admission, transfer to our affiliated center for surgical repair  Dispostion / Final MDM:  After consideration of the diagnostic results and the patient's response to treatment, he will be admitted.  This adult male, previously well, not on anticoagulation presents  after episode of syncope, fall.  Syncope may have been vagal, as the patient is otherwise well, was arising from the commode, though with unclear circumstances, ongoing monitoring, evaluation is warranted.  Initial studies in this regard are generally reassuring, with normal troponin, nonischemic EKG, none arrhythmic EKG.  Patient found to have notable fracture of the right lower extremity, which was discussed with our orthopedic team.  There is an overlying wound, cannot completely exclude the possibility of transient bone penetration, thus the patient received IV antibiotics in addition to interventions as above.  Patient will require admission in this regard for surgical repair.  Final Clinical Impression(s) / ED Diagnoses Final diagnoses:  Syncope and collapse  Fall, initial encounter  Closed fracture of right tibia and fibula, initial encounter     Carmin Muskrat, MD 05/20/21 1534

## 2021-05-20 NOTE — ED Notes (Signed)
Spoke with [facility] for patient transportation to [facility]

## 2021-05-21 ENCOUNTER — Inpatient Hospital Stay (HOSPITAL_COMMUNITY): Payer: Medicare Other

## 2021-05-21 DIAGNOSIS — S82201A Unspecified fracture of shaft of right tibia, initial encounter for closed fracture: Secondary | ICD-10-CM | POA: Diagnosis not present

## 2021-05-21 DIAGNOSIS — R55 Syncope and collapse: Secondary | ICD-10-CM | POA: Diagnosis not present

## 2021-05-21 DIAGNOSIS — S82401A Unspecified fracture of shaft of right fibula, initial encounter for closed fracture: Secondary | ICD-10-CM | POA: Diagnosis not present

## 2021-05-21 LAB — COMPREHENSIVE METABOLIC PANEL
ALT: 22 U/L (ref 0–44)
AST: 25 U/L (ref 15–41)
Albumin: 2.9 g/dL — ABNORMAL LOW (ref 3.5–5.0)
Alkaline Phosphatase: 41 U/L (ref 38–126)
Anion gap: 6 (ref 5–15)
BUN: 17 mg/dL (ref 8–23)
CO2: 30 mmol/L (ref 22–32)
Calcium: 9.5 mg/dL (ref 8.9–10.3)
Chloride: 100 mmol/L (ref 98–111)
Creatinine, Ser: 0.86 mg/dL (ref 0.61–1.24)
GFR, Estimated: >60 mL/min (ref >60)
Glucose, Bld: 132 mg/dL — ABNORMAL HIGH (ref 70–99)
Potassium: 5 mmol/L (ref 3.5–5.1)
Sodium: 136 mmol/L (ref 135–145)
Total Bilirubin: 0.7 mg/dL (ref 0.3–1.2)
Total Protein: 5.9 g/dL — ABNORMAL LOW (ref 6.5–8.1)

## 2021-05-21 LAB — CBC
HCT: 36.3 % — ABNORMAL LOW (ref 39.0–52.0)
Hemoglobin: 12.3 g/dL — ABNORMAL LOW (ref 13.0–17.0)
MCH: 31.6 pg (ref 26.0–34.0)
MCHC: 33.9 g/dL (ref 30.0–36.0)
MCV: 93.3 fL (ref 80.0–100.0)
Platelets: 179 10*3/uL (ref 150–400)
RBC: 3.89 MIL/uL — ABNORMAL LOW (ref 4.22–5.81)
RDW: 13.3 % (ref 11.5–15.5)
WBC: 9.1 10*3/uL (ref 4.0–10.5)
nRBC: 0 % (ref 0.0–0.2)

## 2021-05-21 LAB — ECHOCARDIOGRAM COMPLETE
AV Mean grad: 10 mmHg
AV Peak grad: 18.1 mmHg
Ao pk vel: 2.13 m/s
Area-P 1/2: 3.54 cm2
Calc EF: 71.2 %
Height: 73 in
S' Lateral: 2.9 cm
Single Plane A2C EF: 73.3 %
Single Plane A4C EF: 71.4 %
Weight: 3135.82 oz

## 2021-05-21 LAB — GLUCOSE, CAPILLARY: Glucose-Capillary: 136 mg/dL — ABNORMAL HIGH (ref 70–99)

## 2021-05-21 LAB — SURGICAL PCR SCREEN
MRSA, PCR: NEGATIVE
Staphylococcus aureus: NEGATIVE

## 2021-05-21 MED ORDER — SODIUM CHLORIDE 0.9 % IV SOLN: INTRAVENOUS | Status: DC

## 2021-05-21 NOTE — Consult Note (Signed)
Reason for Consult:Right tib/fib fx Referring Physician: Marlowe Aschoff Dahal Time called: 0730 Time at bedside: 0850   Darin [name] is an 88 y.o. male.  HPI: Darin [name] was in his bathroom yesterday when he became dizzy and fell. He had immediate right knee pain and could not get up or bear weight. He managed to make it to a phone and call 911. He was brought to the ED where x-rays showed a proximal tib/fib fx and orthopedic surgery was consulted. He lives alone at home and generally ambulates with a RW.  Past Medical History:  Diagnosis Date   Arthritis    DUPUYTREN'S CONTRACTURE, RIGHT 07/04/2009   Annotation: 4th digit Qualifier: Diagnosis of  By: Arnoldo Morale MD, Balinda Quails    GERD (gastroesophageal reflux disease)    H/O hiatal hernia    "FOR YEARS" " NO PROBLEM"   Hyperlipidemia    Hypertension    Knee pain    Neuropathy    PERIPHERAL    Osteoporosis    PMR (polymyalgia rheumatica) (HCC)    Polymyalgia ([place]) 5 YEARS    Past Surgical History:  Procedure Laterality Date   CARPAL TUNNEL RELEASE     CATARACT EXTRACTION     right   COLONOSCOPY  2007   INGUINAL HERNIA REPAIR  10/14/2011   Procedure: HERNIA REPAIR INGUINAL ADULT;  Surgeon: [name], MD;  Location: [facility];  Service: General;  Laterality: Right;  Right Inguinal Hernia Repair with Mesh   TONSILLECTOMY     VARICOSE VEIN SURGERY      Family History  Problem Relation Age of Onset   Heart disease Father    Coronary artery disease Other     Social History:  reports that he has never smoked. He has never used smokeless tobacco. He reports current alcohol use of about 3.0 standard drinks per week. He reports that he does not use drugs.  Allergies:  Allergies  Allergen Reactions   Statins Other (See Comments)    Muscle pain and upset stomach    Medications: I have reviewed the patient's current medications.  Results for orders placed or performed during the hospital encounter of 05/20/21 (from the past 48 hour(s))   Urinalysis, Routine w reflex microscopic     Status: Abnormal   Collection Time: 05/20/21 12:58 PM  Result Value Ref Range   Color, Urine YELLOW YELLOW   APPearance CLEAR CLEAR   Specific Gravity, Urine 1.016 1.005 - 1.030   pH 7.0 5.0 - 8.0   Glucose, UA NEGATIVE NEGATIVE mg/dL   Hgb urine dipstick NEGATIVE NEGATIVE   Bilirubin Urine NEGATIVE NEGATIVE   Ketones, ur 20 (A) NEGATIVE mg/dL   Protein, ur NEGATIVE NEGATIVE mg/dL   Nitrite NEGATIVE NEGATIVE   Leukocytes,Ua NEGATIVE NEGATIVE    Comment: Performed at [facility] [address], [place], [place] [address]  CBG monitoring, ED     Status: Abnormal   Collection Time: 05/20/21  1:24 PM  Result Value Ref Range   Glucose-Capillary 140 (H) 70 - 99 mg/dL    Comment: Glucose reference range applies only to samples taken after fasting for at least 8 hours.  Basic metabolic panel     Status: Abnormal   Collection Time: 05/20/21  1:25 PM  Result Value Ref Range   Sodium 135 135 - 145 mmol/L   Potassium 3.7 3.5 - 5.1 mmol/L   Chloride 100 98 - 111 mmol/L   CO2 29 22 - 32 mmol/L   Glucose, Bld 138 (  H) 70 - 99 mg/dL    Comment: Glucose reference range applies only to samples taken after fasting for at least 8 hours.   BUN 21 8 - 23 mg/dL   Creatinine, Ser 0.80 0.61 - 1.24 mg/dL   Calcium 9.2 8.9 - 10.3 mg/dL   GFR, Estimated >60 >60 mL/min    Comment: (NOTE) Calculated using the CKD-EPI Creatinine Equation (2021)    Anion gap 6 5 - 15    Comment: Performed at [facility], [place] [address], [place], [place] [address]  CBC     Status: Abnormal   Collection Time: 05/20/21  1:25 PM  Result Value Ref Range   WBC 15.2 (H) 4.0 - 10.5 K/uL   RBC 4.17 (L) 4.22 - 5.81 MIL/uL   Hemoglobin 13.2 13.0 - 17.0 g/dL   HCT 39.1 39.0 - 52.0 %   MCV 93.8 80.0 - 100.0 fL   MCH 31.7 26.0 - 34.0 pg   MCHC 33.8 30.0 - 36.0 g/dL   RDW 13.2 11.5 - 15.5 %   Platelets 184 150 - 400 K/uL   nRBC 0.0 0.0 - 0.2 %     Comment: Performed at [facility], [place] [address] [place], [place] [address]  I-stat chem 8, ED (not at [facility] or [facility])     Status: Abnormal   Collection Time: 05/20/21  1:40 PM  Result Value Ref Range   Sodium 135 135 - 145 mmol/L   Potassium 3.8 3.5 - 5.1 mmol/L   Chloride 99 98 - 111 mmol/L   BUN 22 8 - 23 mg/dL   Creatinine, Ser 0.80 0.61 - 1.24 mg/dL   Glucose, Bld 133 (H) 70 - 99 mg/dL    Comment: Glucose reference range applies only to samples taken after fasting for at least 8 hours.   Calcium, Ion 1.27 1.15 - 1.40 mmol/L   TCO2 32 22 - 32 mmol/L   Hemoglobin 13.3 13.0 - 17.0 g/dL   HCT 39.0 39.0 - 52.0 %  Resp Panel by RT-PCR (Flu A&B, Covid) Nasopharyngeal Swab     Status: None   Collection Time: 05/20/21  3:07 PM   Specimen: Nasopharyngeal Swab; Nasopharyngeal(NP) swabs in vial transport medium  Result Value Ref Range   SARS Coronavirus 2 by RT PCR NEGATIVE NEGATIVE    Comment: (NOTE) SARS-CoV-2 target nucleic acids are NOT DETECTED.  The SARS-CoV-2 RNA is generally detectable in upper respiratory specimens during the acute phase of infection. The lowest concentration of SARS-CoV-2 viral copies this assay can detect is 138 copies/mL. A negative result does not preclude SARS-Cov-2 infection and should not be used as the sole basis for treatment or other patient management decisions. A negative result may occur with  improper specimen collection/handling, submission of specimen other than nasopharyngeal swab, presence of viral mutation(s) within the areas targeted by this assay, and inadequate number of viral copies(<138 copies/mL). A negative result must be combined with clinical observations, patient history, and epidemiological information. The expected result is Negative.  Fact Sheet for Patients:  EntrepreneurPulse.com.au  Fact Sheet for Healthcare Providers:  IncredibleEmployment.be  This test is no t  yet approved or cleared by the [place] FDA and  has been authorized for detection and/or diagnosis of SARS-CoV-2 by FDA under an Emergency Use Authorization (EUA). This EUA will remain  in effect (meaning this test can be used) for the duration of the COVID-19 declaration under Section 564(b)(1) of the Act, 21 U.S.C.section 360bbb-3(b)(1), unless the authorization  is terminated  or revoked sooner.       Influenza A by PCR NEGATIVE NEGATIVE   Influenza B by PCR NEGATIVE NEGATIVE    Comment: (NOTE) The Xpert Xpress SARS-CoV-2/FLU/RSV plus assay is intended as an aid in the diagnosis of influenza from Nasopharyngeal swab specimens and should not be used as a sole basis for treatment. Nasal washings and aspirates are unacceptable for Xpert Xpress SARS-CoV-2/FLU/RSV testing.  Fact Sheet for Patients: EntrepreneurPulse.com.au  Fact Sheet for Healthcare Providers: IncredibleEmployment.be  This test is not yet approved or cleared by the [place] FDA and has been authorized for detection and/or diagnosis of SARS-CoV-2 by FDA under an Emergency Use Authorization (EUA). This EUA will remain in effect (meaning this test can be used) for the duration of the COVID-19 declaration under Section 564(b)(1) of the Act, 21 U.S.C. section 360bbb-3(b)(1), unless the authorization is terminated or revoked.  Performed at [facility], [place] [address], [place], [place] [address]   Magnesium     Status: None   Collection Time: 05/20/21 10:02 PM  Result Value Ref Range   Magnesium 2.0 1.7 - 2.4 mg/dL    Comment: Performed at [facility], [place] [address], [place], [place] [address]  Hepatic function panel     Status: Abnormal   Collection Time: 05/20/21 10:02 PM  Result Value Ref Range   Total Protein 5.7 (L) 6.5 - 8.1 g/dL   Albumin 2.7 (L) 3.5 - 5.0 g/dL   AST 30 15 - 41 U/L   ALT 22 0 - 44 U/L   Alkaline Phosphatase 45 38 -  126 U/L   Total Bilirubin 0.5 0.3 - 1.2 mg/dL   Bilirubin, Direct 0.1 0.0 - 0.2 mg/dL   Indirect Bilirubin 0.4 0.3 - 0.9 mg/dL    Comment: Performed at [facility] [address] [place], [place] [address]  Surgical PCR screen     Status: None   Collection Time: 05/20/21 11:34 PM   Specimen: Nasal Mucosa; Nasal Swab  Result Value Ref Range   MRSA, PCR NEGATIVE NEGATIVE   Staphylococcus aureus NEGATIVE NEGATIVE    Comment: (NOTE) The Xpert SA Assay (FDA approved for NASAL specimens in patients 14 years of age and older), is one component of a comprehensive surveillance program. It is not intended to diagnose infection nor to guide or monitor treatment. Performed at [facility], [place] [address] [place], [place] [address]   Comprehensive metabolic panel     Status: Abnormal   Collection Time: 05/21/21  3:06 AM  Result Value Ref Range   Sodium 136 135 - 145 mmol/L   Potassium 5.0 3.5 - 5.1 mmol/L   Chloride 100 98 - 111 mmol/L   CO2 30 22 - 32 mmol/L   Glucose, Bld 132 (H) 70 - 99 mg/dL    Comment: Glucose reference range applies only to samples taken after fasting for at least 8 hours.   BUN 17 8 - 23 mg/dL   Creatinine, Ser 0.86 0.61 - 1.24 mg/dL   Calcium 9.5 8.9 - 10.3 mg/dL   Total Protein 5.9 (L) 6.5 - 8.1 g/dL   Albumin 2.9 (L) 3.5 - 5.0 g/dL   AST 25 15 - 41 U/L   ALT 22 0 - 44 U/L   Alkaline Phosphatase 41 38 - 126 U/L   Total Bilirubin 0.7 0.3 - 1.2 mg/dL   GFR, Estimated >60 >60 mL/min    Comment: (NOTE) Calculated using the  CKD-EPI Creatinine Equation (2021)    Anion gap 6 5 - 15    Comment: Performed at [facility], [place] [address], [place], [place] [address]  CBC     Status: Abnormal   Collection Time: 05/21/21  3:06 AM  Result Value Ref Range   WBC 9.1 4.0 - 10.5 K/uL   RBC 3.89 (L) 4.22 - 5.81 MIL/uL   Hemoglobin 12.3 (L) 13.0 - 17.0 g/dL   HCT 36.3 (L) 39.0 - 52.0 %   MCV 93.3 80.0 - 100.0 fL   MCH 31.6 26.0 - 34.0 pg   MCHC 33.9  30.0 - 36.0 g/dL   RDW 13.3 11.5 - 15.5 %   Platelets 179 150 - 400 K/uL   nRBC 0.0 0.0 - 0.2 %    Comment: Performed at [facility], [place] [address] [place], [place] [address]  Glucose, capillary     Status: Abnormal   Collection Time: 05/21/21  4:55 AM  Result Value Ref Range   Glucose-Capillary 136 (H) 70 - 99 mg/dL    Comment: Glucose reference range applies only to samples taken after fasting for at least 8 hours.    DG Tibia/Fibula Right  Result Date: 05/20/2021 CLINICAL DATA:  Fall.  Right leg pain.  Initial encounter. EXAM: RIGHT TIBIA AND FIBULA - 2 VIEW COMPARISON:  None. FINDINGS: Fractures of the proximal tibial diaphysis and proximal fibular metaphysis are seen, both showing mild-to-moderate lateral displacement of the distal fracture fragments. Severe tricompartmental osteoarthritis of the right knee is noted. IMPRESSION: Displaced fractures of the proximal tibial diaphysis and proximal fibular metaphysis. Electronically Signed   By: Marlaine Hind M.D.   On: 05/20/2021 15:07   DG Chest Port 1 View  Result Date: 05/20/2021 CLINICAL DATA:  Fall in bathroom. Right leg fracture. Pre-op clearance exam EXAM: PORTABLE CHEST 1 VIEW COMPARISON:  04/21/2012 FINDINGS: The heart size and mediastinal contours are within normal limits. Aortic atherosclerotic calcification noted. Both lungs are clear. The visualized skeletal structures are unremarkable. IMPRESSION: No active disease. Electronically Signed   By: Marlaine Hind M.D.   On: 05/20/2021 15:07   DG Knee Complete 4 Views Right  Result Date: 05/20/2021 CLINICAL DATA:  Fall at home.  Right knee pain. EXAM: RIGHT KNEE - COMPLETE 4+ VIEW COMPARISON:  None. FINDINGS: Acute fractures of the proximal tibial diaphysis and proximal fibular metaphysis are seen with mild lateral and anterior displacement of the distal fracture fragments. Severe tricompartmental osteoarthritis is seen. Small knee joint effusion noted. Generalized osteopenia  demonstrated. Peripheral vascular calcification also seen. IMPRESSION: Acute fractures of the proximal tibial diaphysis and proximal fibular metaphysis. Severe tricompartmental osteoarthritis, and small knee joint effusion. Electronically Signed   By: Marlaine Hind M.D.   On: 05/20/2021 15:05    Review of Systems  HENT:  Negative for ear discharge, ear pain, hearing loss and tinnitus.   Eyes:  Negative for photophobia and pain.  Respiratory:  Negative for cough and shortness of breath.   Cardiovascular:  Negative for chest pain.  Gastrointestinal:  Negative for abdominal pain, nausea and vomiting.  Genitourinary:  Negative for dysuria, flank pain, frequency and urgency.  Musculoskeletal:  Positive for arthralgias (Right knee). Negative for back pain, myalgias and neck pain.  Neurological:  Negative for dizziness and headaches.  Hematological:  Does not bruise/bleed easily.  Psychiatric/Behavioral:  The patient is not nervous/anxious.   Blood pressure 137/62, pulse 95, temperature 98.7 F (37.1 C), temperature source Oral, resp. rate 18,  height 6\' 1"  (1.854 m), weight 88.9 kg, SpO2 98 %. Physical Exam Constitutional:      General: He is not in acute distress.    Appearance: He is well-developed. He is not diaphoretic.  HENT:     Head: Normocephalic and atraumatic.  Eyes:     General: No scleral icterus.       Right eye: No discharge.        Left eye: No discharge.     Conjunctiva/sclera: Conjunctivae normal.  Cardiovascular:     Rate and Rhythm: Normal rate and regular rhythm.  Pulmonary:     Effort: Pulmonary effort is normal. No respiratory distress.  Musculoskeletal:     Cervical back: Normal range of motion.     Comments: LLE No traumatic wounds, ecchymosis, or rash  KI in place  No ankle effusion  Sens DPN, SPN, TN intact  Motor EHL, ext, flex, evers 5/5  DP 2+, PT 0, 1+ NP edema  Skin:    General: Skin is warm and dry.  Neurological:     Mental Status: He is alert.   Psychiatric:        Mood and Affect: Mood normal.        Behavior: Behavior normal.    Assessment/Plan: Right tib/fib fx -- Plan IMN tomorrow with Dr. Marcelino Scot. Please keep NPO after MN.    Lisette Abu, PA-C Orthopedic Surgery [phone] 05/21/2021, 9:06 AM

## 2021-05-21 NOTE — Plan of Care (Signed)

## 2021-05-21 NOTE — Progress Notes (Signed)
Carotid duplex bilateral study completed.   Please see CV Proc for preliminary results.   Scottie Stanish, RDMS, RVT  

## 2021-05-21 NOTE — Progress Notes (Signed)
PROGRESS NOTE  Darin [name]  DOB: 1931-04-04  PCP: [name], MD OYD:[id number]  DOA: 05/20/2021  LOS: 1 day  Hospital Day: 2  Brief narrative: Darin [name] is a 88 y.o. male with PMH significant for HTN, HLD, polymyalgia rheumatica, peripheral neuropathy, arthritis, Dupuytren's contracture on the right hand, GERD, hiatal hernia, osteoporosis who lives at home alone. Patient was brought to the [facility] ED on 2/26 from home after an episode of syncope.  Patient reports while getting up from the toilet, he passed out and fell on the floor.  He started having right leg/ankle pain and also had a small laceration/tear on the left wrist. Patient also reports having similar syncopal episode 2 months ago without any subsequent injury.  He did not seek medical attention at the time.  In the ED patient was afebrile, heart rate 100, blood pressure 154/74. Right knee/tibia/fibula x-ray showed displaced fractures of the proximal tibial diaphysis and proximal fibular metaphysis. Labs showed WBC count elevated to 15.2, hemoglobin 13.2, glucose level 138. Orthopedic surgery was consulted Patient was transferred to [facility] Admitted to hospitalist service   Subjective: Patient was seen and examined this morning. Pleasant elderly Caucasian male.  Lying on bed.  Not in distress.  Pain controlled. Noted a plan from orthopedics for intramedullary nail by Dr. Marcelino Scot tomorrow.   Principal Problem:   Tibia/fibula fracture, right, closed, initial encounter Active Problems:   Hyperlipidemia   Essential hypertension   GERD (gastroesophageal reflux disease)   Syncope   COPD (chronic obstructive pulmonary disease) (HCC)   BPH (benign prostatic hyperplasia)   Assessment and Plan: Tibia/fibula fracture, right, closed, initial encounter -Orthopedic consulted.  Noted a plan for IM nailing tomorrow. -Continue pain control  Vasovagal syncope History of hypertension -Patient reportedly  passed out while getting up from the bathroom.  Probably an orthostatic hypotension. -Patient reports syncope 2 months ago while [place]  Patient reports EMS noted normal blood pressure at the time and he did not seek medical attention. -It sounds both incidents were immediately after he stood up from sitting position. -His home meds include benazepril 10 mg daily, HCTZ 12.5 mg daily, amlodipine 5 mg daily. -I will hold HCTZ and benazepril preoperatively.  Continue amlodipine. -Echocardiogram pending. -I will start on normal saline at 75 mill per hour.  Hyperlipidemia -Diet controlled   GERD (gastroesophageal reflux disease) -Continue Protonix and Pepcid.  COPD (chronic obstructive pulmonary disease) -Continue bronchodilators   BPH (benign prostatic hyperplasia) -Continue finasteride 5 mg p.o. daily.  Goals of care   Code Status: Full Code    Mobility: Needs PT eval postprocedure  Nutritional status:  Body mass index is 25.86 kg/m.          Diet:  Diet Order             Diet NPO time specified Except for: Sips with Meds  Diet effective midnight           Diet regular Room service appropriate? Yes; Fluid consistency: Thin  Diet effective now                   DVT prophylaxis:  SCDs Start: 05/20/21 1641   Antimicrobials: None Fluid: NS at 20 Consultants: Orthopedics Family Communication: None at bedside  Status is: Inpatient  Continue in-hospital care because: Pending ORIF Level of care: Telemetry Medical   Dispo: The patient is from: Home  Anticipated d/c is to: Pending clinical course              Patient currently is not medically stable to d/c.   Difficult to place patient No     Infusions:   sodium chloride      Scheduled Meds:  amLODipine  5 mg Oral QHS   vitamin C  500 mg Oral Daily   cholecalciferol  1,000 Units Oral QHS   famotidine  20 mg Oral QHS   finasteride  5 mg Oral Daily    gabapentin  100 mg Oral QID   magnesium oxide  400 mg Oral Daily   mupirocin ointment  1 application Nasal BID   pantoprazole  40 mg Oral Daily   polyethylene glycol  17 g Oral BID   sodium chloride flush  3 mL Intravenous Q12H    PRN meds: acetaminophen **OR** acetaminophen, fentaNYL (SUBLIMAZE) injection, oxyCODONE   Antimicrobials: Anti-infectives (From admission, onward)    Start     Dose/Rate Route Frequency Ordered Stop   05/20/21 1515  ceFAZolin (ANCEF) IVPB 1 g/50 mL premix        1 g 100 mL/hr over 30 Minutes Intravenous  Once 05/20/21 1506 05/20/21 1554       Objective: Vitals:   05/21/21 0358 05/21/21 0923  BP: 137/62 (!) 144/64  Pulse: 95 93  Resp: 18 18  Temp: 98.7 F (37.1 C) 98.3 F (36.8 C)  SpO2: 98% 95%    Intake/Output Summary (Last 24 hours) at 05/21/2021 1158 Last data filed at 05/21/2021 1009 Gross per 24 hour  Intake 350 ml  Output 1175 ml  Net -825 ml   Filed Weights   05/20/21 2118 05/21/21 0442  Weight: 88.9 kg 88.9 kg   Weight change:  Body mass index is 25.86 kg/m.   Physical Exam: General exam: Pleasant, elderly Caucasian male.  Not in distress Skin: No rashes, lesions or ulcers. HEENT: Atraumatic, normocephalic, no obvious bleeding Lungs: Clear to auscultation bilaterally CVS: Regular rate and rhythm, no murmur GI/Abd soft, nontender, nondistended, bowel sound present CNS: Alert, awake, oriented x3 Psychiatry: Mood appropriate Extremities: No pedal edema, no calf tenderness  Data Review: I have personally reviewed the laboratory data and studies available.  F/u labs ordered Unresulted Labs (From admission, onward)    None       Signed, Terrilee Croak, MD [facility] 05/21/2021

## 2021-05-21 NOTE — Progress Notes (Signed)
° °  Echocardiogram 2D Echocardiogram has been performed.  Darin [name] 05/21/2021, 10:55 AM

## 2021-05-21 NOTE — Plan of Care (Signed)
VSS. Room air. Knee immobilizer in place. Condom cath intact. BM today. Mild pain, given PRN meds. Repositioned for comfort. Call bell within reach. Bed alarm on. Plan for OR tomorrow.   Problem: Clinical Measurements: Goal: Ability to maintain clinical measurements within normal limits will improve Outcome: Progressing Goal: Will remain free from infection Outcome: Progressing Goal: Diagnostic test results will improve Outcome: Progressing Goal: Respiratory complications will improve Outcome: Progressing Goal: Cardiovascular complication will be avoided Outcome: Progressing   Problem: Elimination: Goal: Will not experience complications related to bowel motility Outcome: Progressing Goal: Will not experience complications related to urinary retention Outcome: Progressing   Problem: Pain Managment: Goal: General experience of comfort will improve Outcome: Progressing   Problem: Safety: Goal: Ability to remain free from injury will improve Outcome: Progressing   Problem: Skin Integrity: Goal: Risk for impaired skin integrity will decrease Outcome: Progressing

## 2021-05-22 ENCOUNTER — Other Ambulatory Visit: Payer: Self-pay

## 2021-05-22 ENCOUNTER — Inpatient Hospital Stay (HOSPITAL_COMMUNITY): Payer: Medicare Other | Admitting: Anesthesiology

## 2021-05-22 ENCOUNTER — Encounter (HOSPITAL_COMMUNITY): Payer: Self-pay | Admitting: Internal Medicine

## 2021-05-22 ENCOUNTER — Encounter (HOSPITAL_COMMUNITY)
Admission: EM | Disposition: A | Discharge status: Skilled Nursing Facility | Payer: Self-pay | Source: Home / Self Care | Attending: Internal Medicine

## 2021-05-22 ENCOUNTER — Inpatient Hospital Stay (HOSPITAL_COMMUNITY): Payer: Medicare Other

## 2021-05-22 DIAGNOSIS — S82201A Unspecified fracture of shaft of right tibia, initial encounter for closed fracture: Secondary | ICD-10-CM

## 2021-05-22 HISTORY — PX: TIBIA IM NAIL INSERTION: SHX2516

## 2021-05-22 LAB — LIPID PANEL
Cholesterol: 211 mg/dL — ABNORMAL HIGH (ref 0–200)
HDL: 73 mg/dL (ref >40)
LDL Cholesterol: 125 mg/dL — ABNORMAL HIGH (ref 0–99)
Total CHOL/HDL Ratio: 2.9 RATIO
Triglycerides: 67 mg/dL (ref <150)
VLDL: 13 mg/dL (ref 0–40)

## 2021-05-22 LAB — GLUCOSE, CAPILLARY
Glucose-Capillary: 103 mg/dL — ABNORMAL HIGH (ref 70–99)
Glucose-Capillary: 170 mg/dL — ABNORMAL HIGH (ref 70–99)

## 2021-05-22 SURGERY — INSERTION, INTRAMEDULLARY ROD, TIBIA
Anesthesia: General | Laterality: Right

## 2021-05-22 MED ORDER — AMISULPRIDE (ANTIEMETIC) 5 MG/2ML IV SOLN
INTRAVENOUS | Status: AC
Start: 2021-05-22 — End: 2021-05-23
  Filled 2021-05-22: qty 4

## 2021-05-22 MED ORDER — 0.9 % SODIUM CHLORIDE (POUR BTL) OPTIME
TOPICAL | Status: DC | PRN
  Administered 2021-05-22: 1000 mL

## 2021-05-22 MED ORDER — OXYCODONE HCL 5 MG/5ML PO SOLN: 5.0000 mg | Freq: Once | ORAL | Status: DC | PRN

## 2021-05-22 MED ORDER — FENTANYL CITRATE (PF) 100 MCG/2ML IJ SOLN
25.0000 ug | INTRAMUSCULAR | Status: DC | PRN
  Administered 2021-05-22 (×3): 50 ug via INTRAVENOUS

## 2021-05-22 MED ORDER — CHLORHEXIDINE GLUCONATE 0.12 % MT SOLN: 15.0000 mL | Freq: Once | OROMUCOSAL | Status: AC

## 2021-05-22 MED ORDER — ACETAMINOPHEN 160 MG/5ML PO SOLN: 1000.0000 mg | Freq: Once | ORAL | Status: DC | PRN

## 2021-05-22 MED ORDER — CHLORHEXIDINE GLUCONATE 4 % EX LIQD
60.0000 mL | Freq: Once | CUTANEOUS | Status: DC
  Filled 2021-05-22: qty 60

## 2021-05-22 MED ORDER — LIDOCAINE 2% (20 MG/ML) 5 ML SYRINGE
INTRAMUSCULAR | Status: AC
  Filled 2021-05-22: qty 5

## 2021-05-22 MED ORDER — AMISULPRIDE (ANTIEMETIC) 5 MG/2ML IV SOLN
10.0000 mg | Freq: Once | INTRAVENOUS | Status: AC
  Administered 2021-05-22: 10 mg via INTRAVENOUS

## 2021-05-22 MED ORDER — PROPOFOL 10 MG/ML IV BOLUS
INTRAVENOUS | Status: DC | PRN
  Administered 2021-05-22: 20 mg via INTRAVENOUS
  Administered 2021-05-22: 110 mg via INTRAVENOUS

## 2021-05-22 MED ORDER — DEXAMETHASONE SODIUM PHOSPHATE 10 MG/ML IJ SOLN
INTRAMUSCULAR | Status: AC
  Filled 2021-05-22: qty 1

## 2021-05-22 MED ORDER — OXYCODONE HCL 5 MG PO TABS
5.0000 mg | ORAL_TABLET | ORAL | Status: DC | PRN
  Administered 2021-05-22 – 2021-05-23 (×4): 5 mg via ORAL
  Filled 2021-05-22 (×5): qty 1

## 2021-05-22 MED ORDER — PHENYLEPHRINE HCL-NACL 20-0.9 MG/250ML-% IV SOLN
INTRAVENOUS | Status: DC | PRN
Start: 2021-05-22 — End: 2021-05-22
  Administered 2021-05-22: 30 ug/min via INTRAVENOUS

## 2021-05-22 MED ORDER — FENTANYL CITRATE (PF) 100 MCG/2ML IJ SOLN
INTRAMUSCULAR | Status: AC
  Filled 2021-05-22: qty 2

## 2021-05-22 MED ORDER — POVIDONE-IODINE 10 % EX SWAB
2.0000 "application " | Freq: Once | CUTANEOUS | Status: AC
  Administered 2021-05-22: 2 via TOPICAL

## 2021-05-22 MED ORDER — ROCURONIUM BROMIDE 10 MG/ML (PF) SYRINGE
PREFILLED_SYRINGE | INTRAVENOUS | Status: AC
  Filled 2021-05-22: qty 10

## 2021-05-22 MED ORDER — FENTANYL CITRATE (PF) 250 MCG/5ML IJ SOLN
INTRAMUSCULAR | Status: DC | PRN
  Administered 2021-05-22: 50 ug via INTRAVENOUS
  Administered 2021-05-22: 100 ug via INTRAVENOUS
  Administered 2021-05-22: 25 ug via INTRAVENOUS

## 2021-05-22 MED ORDER — PHENYLEPHRINE 40 MCG/ML (10ML) SYRINGE FOR IV PUSH (FOR BLOOD PRESSURE SUPPORT)
PREFILLED_SYRINGE | INTRAVENOUS | Status: DC | PRN
  Administered 2021-05-22: 80 ug via INTRAVENOUS
  Administered 2021-05-22: 40 ug via INTRAVENOUS

## 2021-05-22 MED ORDER — SUGAMMADEX SODIUM 200 MG/2ML IV SOLN
INTRAVENOUS | Status: DC | PRN
  Administered 2021-05-22: 200 mg via INTRAVENOUS

## 2021-05-22 MED ORDER — DEXAMETHASONE SODIUM PHOSPHATE 10 MG/ML IJ SOLN
INTRAMUSCULAR | Status: DC | PRN
  Administered 2021-05-22: 10 mg via INTRAVENOUS

## 2021-05-22 MED ORDER — OXYCODONE HCL 5 MG PO TABS: 5.0000 mg | ORAL_TABLET | Freq: Once | ORAL | Status: DC | PRN

## 2021-05-22 MED ORDER — LIDOCAINE 2% (20 MG/ML) 5 ML SYRINGE
INTRAMUSCULAR | Status: DC | PRN
Start: 2021-05-22 — End: 2021-05-22
  Administered 2021-05-22: 60 mg via INTRAVENOUS

## 2021-05-22 MED ORDER — ONDANSETRON HCL 4 MG/2ML IJ SOLN
INTRAMUSCULAR | Status: DC | PRN
  Administered 2021-05-22: 4 mg via INTRAVENOUS

## 2021-05-22 MED ORDER — ACETAMINOPHEN 10 MG/ML IV SOLN
INTRAVENOUS | Status: AC
Start: 2021-05-22 — End: 2021-05-23
  Filled 2021-05-22: qty 100

## 2021-05-22 MED ORDER — CEFAZOLIN SODIUM-DEXTROSE 2-4 GM/100ML-% IV SOLN
2.0000 g | INTRAVENOUS | Status: AC
  Administered 2021-05-22: 2 g via INTRAVENOUS
  Filled 2021-05-22: qty 100

## 2021-05-22 MED ORDER — CEFAZOLIN SODIUM-DEXTROSE 2-4 GM/100ML-% IV SOLN
2.0000 g | Freq: Three times a day (TID) | INTRAVENOUS | Status: AC
Start: 2021-05-22 — End: 2021-05-23
  Administered 2021-05-22 – 2021-05-23 (×3): 2 g via INTRAVENOUS
  Filled 2021-05-22 (×3): qty 100

## 2021-05-22 MED ORDER — ACETAMINOPHEN 500 MG PO TABS
1000.0000 mg | ORAL_TABLET | Freq: Three times a day (TID) | ORAL | Status: DC
  Administered 2021-05-22 – 2021-05-24 (×5): 1000 mg via ORAL
  Filled 2021-05-22 (×5): qty 2

## 2021-05-22 MED ORDER — ROCURONIUM BROMIDE 10 MG/ML (PF) SYRINGE
PREFILLED_SYRINGE | INTRAVENOUS | Status: DC | PRN
  Administered 2021-05-22 (×2): 30 mg via INTRAVENOUS
  Administered 2021-05-22: 70 mg via INTRAVENOUS

## 2021-05-22 MED ORDER — ACETAMINOPHEN 10 MG/ML IV SOLN
1000.0000 mg | Freq: Once | INTRAVENOUS | Status: DC | PRN
  Administered 2021-05-22: 1000 mg via INTRAVENOUS

## 2021-05-22 MED ORDER — FENTANYL CITRATE (PF) 250 MCG/5ML IJ SOLN
INTRAMUSCULAR | Status: AC
  Filled 2021-05-22: qty 5

## 2021-05-22 MED ORDER — CHLORHEXIDINE GLUCONATE 0.12 % MT SOLN
OROMUCOSAL | Status: AC
  Administered 2021-05-22: 15 mL via OROMUCOSAL
  Filled 2021-05-22: qty 15

## 2021-05-22 MED ORDER — ONDANSETRON HCL 4 MG/2ML IJ SOLN
INTRAMUSCULAR | Status: AC
  Filled 2021-05-22: qty 2

## 2021-05-22 MED ORDER — ACETAMINOPHEN 500 MG PO TABS: 1000.0000 mg | ORAL_TABLET | Freq: Once | ORAL | Status: DC | PRN

## 2021-05-22 MED ORDER — ENOXAPARIN SODIUM 40 MG/0.4ML IJ SOSY
40.0000 mg | PREFILLED_SYRINGE | Freq: Every day | INTRAMUSCULAR | Status: DC
  Administered 2021-05-23 – 2021-05-24 (×2): 40 mg via SUBCUTANEOUS
  Filled 2021-05-22 (×2): qty 0.4

## 2021-05-22 MED ORDER — ORAL CARE MOUTH RINSE: 15.0000 mL | Freq: Once | OROMUCOSAL | Status: AC

## 2021-05-22 MED ORDER — PHENYLEPHRINE 40 MCG/ML (10ML) SYRINGE FOR IV PUSH (FOR BLOOD PRESSURE SUPPORT)
PREFILLED_SYRINGE | INTRAVENOUS | Status: AC
  Filled 2021-05-22: qty 10

## 2021-05-22 MED ORDER — LACTATED RINGERS IV SOLN: INTRAVENOUS | Status: DC

## 2021-05-22 SURGICAL SUPPLY — 56 items
BAG COUNTER SPONGE SURGICOUNT (BAG) ×3 IMPLANT
BAG SPNG CNTER NS LX DISP (BAG) ×1
BIT DRILL CALIBRATED 4.2 (BIT) IMPLANT
BIT DRILL SHORT 4.2 (BIT) IMPLANT
BLADE SURG 10 STRL SS (BLADE) ×3 IMPLANT
BNDG ELASTIC 4X5.8 VLCR STR LF (GAUZE/BANDAGES/DRESSINGS) ×1 IMPLANT
BNDG ELASTIC 6X5.8 VLCR STR LF (GAUZE/BANDAGES/DRESSINGS) ×1 IMPLANT
BNDG GAUZE ELAST 4 BULKY (GAUZE/BANDAGES/DRESSINGS) ×2 IMPLANT
BRUSH SCRUB EZ PLAIN DRY (MISCELLANEOUS) ×6 IMPLANT
COVER SURGICAL LIGHT HANDLE (MISCELLANEOUS) ×6 IMPLANT
DRAPE C-ARM 42X72 X-RAY (DRAPES) ×3 IMPLANT
DRAPE C-ARMOR (DRAPES) ×3 IMPLANT
DRAPE HALF SHEET 40X57 (DRAPES) IMPLANT
DRAPE INCISE IOBAN 66X45 STRL (DRAPES) IMPLANT
DRAPE U-SHAPE 47X51 STRL (DRAPES) ×3 IMPLANT
DRILL BIT CALIBRATED 4.2 (BIT) ×2
DRILL BIT SHORT 4.2 (BIT) ×2
DRSG MEPITEL 8X12 (GAUZE/BANDAGES/DRESSINGS) ×1 IMPLANT
ELECT REM PT RETURN 9FT ADLT (ELECTROSURGICAL) ×2
ELECTRODE REM PT RTRN 9FT ADLT (ELECTROSURGICAL) ×2 IMPLANT
GAUZE SPONGE 4X4 12PLY STRL (GAUZE/BANDAGES/DRESSINGS) ×1 IMPLANT
GLOVE SRG 8 PF TXTR STRL LF DI (GLOVE) ×2 IMPLANT
GLOVE SURG ENC MOIS LTX SZ8 (GLOVE) ×3 IMPLANT
GLOVE SURG ENC MOIS LTX SZ8.5 (GLOVE) ×3 IMPLANT
GLOVE SURG ORTHO LTX SZ7.5 (GLOVE) ×6 IMPLANT
GLOVE SURG UNDER POLY LF SZ7.5 (GLOVE) ×3 IMPLANT
GLOVE SURG UNDER POLY LF SZ8 (GLOVE) ×2
GOWN STRL REUS W/ TWL LRG LVL3 (GOWN DISPOSABLE) ×4 IMPLANT
GOWN STRL REUS W/ TWL XL LVL3 (GOWN DISPOSABLE) ×2 IMPLANT
GOWN STRL REUS W/TWL LRG LVL3 (GOWN DISPOSABLE) ×4
GOWN STRL REUS W/TWL XL LVL3 (GOWN DISPOSABLE) ×2
KIT BASIN OR (CUSTOM PROCEDURE TRAY) ×3 IMPLANT
KIT TURNOVER KIT B (KITS) ×3 IMPLANT
NAIL TIB TFNA 10X405 (Nail) ×1 IMPLANT
PACK ORTHO EXTREMITY (CUSTOM PROCEDURE TRAY) ×3 IMPLANT
PAD ARMBOARD 7.5X6 YLW CONV (MISCELLANEOUS) ×6 IMPLANT
PAD CAST 4YDX4 CTTN HI CHSV (CAST SUPPLIES) IMPLANT
PADDING CAST COTTON 4X4 STRL (CAST SUPPLIES) ×2
PADDING CAST COTTON 6X4 STRL (CAST SUPPLIES) ×1 IMPLANT
REAMER ROD 3.8 BALL TIP 3X950 (ORTHOPEDIC DISPOSABLE SUPPLIES) ×1 IMPLANT
SCREW LOCK IM 5X52 (Screw) ×1 IMPLANT
SCREW LOCK IM 5X56X125 (Screw) ×1 IMPLANT
SCREW LOCK IM 5X58 (Screw) ×1 IMPLANT
SCREW LOCK IM NAIL 5X34 (Screw) ×1 IMPLANT
SCREW LOCK IM NAIL 5X70 STRL (Screw) ×1 IMPLANT
SCREW LOCK IM TI 5X42 STRL (Screw) ×1 IMPLANT
SPONGE T-LAP 18X18 ~~LOC~~+RFID (SPONGE) ×2 IMPLANT
STAPLER VISISTAT 35W (STAPLE) ×3 IMPLANT
SUT ETHILON 2 0 FS 18 (SUTURE) ×7 IMPLANT
SUT VIC AB 0 CT1 27 (SUTURE)
SUT VIC AB 0 CT1 27XBRD ANBCTR (SUTURE) IMPLANT
SUT VIC AB 2-0 CT1 27 (SUTURE) ×2
SUT VIC AB 2-0 CT1 TAPERPNT 27 (SUTURE) ×2 IMPLANT
TOWEL GREEN STERILE (TOWEL DISPOSABLE) ×6 IMPLANT
TOWEL GREEN STERILE FF (TOWEL DISPOSABLE) ×3 IMPLANT
YANKAUER SUCT BULB TIP NO VENT (SUCTIONS) IMPLANT

## 2021-05-22 NOTE — Op Note (Signed)
05/22/2021 3:07 PM  PATIENT:  Darin [name] 88 y.o.   DATE OF BIRTH: [birth year]  MEDICAL RECORD NUMBER: [id number]  PRE-OPERATIVE DIAGNOSIS:   RIGHT PROXIMAL SHAFT TIBIA FRACTURE FRACTURE BULLAE RIGHT LEG RIGHT KNEE ARTHRITIS  POST-OPERATIVE DIAGNOSIS:   RIGHT PROXIMAL SHAFT TIBIA FRACTURE FRACTURE BULLAE RIGHT LEG RIGHT KNEE ARTHRITIS WITH ASSOCIATED KNEE FLEXION CONTRACTURE 20 DEGREES  PROCEDURE:  Procedure(s): RIGHT TIBIAL SHAFT FRACTURE FIXATION (Left) with SYNTHES 10 X 405 MM NAIL, statically locked DRESSING CHANGE UNDER ANESTHESIA  SURGEON:  Surgeon(s) and Role:    Altamese [place], MD - Primary  ASSISTANTS: PA Student  ANESTHESIA:   none  EBL:  Minimal   BLOOD ADMINISTERED: None  DRAINS: None   LOCAL MEDICATIONS USED:  NONE  SPECIMEN:  No Specimen  DISPOSITION OF SPECIMEN:  N/A  COUNTS:  YES  TOURNIQUET:  * No tourniquets in log *  DICTATION: .Note written in EPIC  PLAN OF CARE: Admit to inpatient   PATIENT DISPOSITION:  PACU - hemodynamically stable.   Delay start of Pharmacological VTE agent (>24hrs) due to surgical blood loss or risk of bleeding: no  BRIEF SUMMARY AND INDICATIONS FOR PROCEDURE:  Darin [name] is a 88 y.o. who sustained a tibia fracture from ground level fall. He uses a walker at baseline and has severe right knee arthritis for which he receives regular steroid injections. Patient denied increasing pain or paresthesia. I also discussed with the patient the risks and benefits of surgery, including the possibility of infection, nerve injury, vessel injury, wound breakdown, arthritis, symptomatic hardware, DVT/ PE, loss of motion, malunion, nonunion, heart attack, stroke, prolonged intubation, and need for further surgery among others. These risks were acknowledged and consent given to proceed.  BRIEF SUMMARY OF PROCEDURE:  The patient was taken to the operating room after administration of Ancef for antibiotics.  The operative  extremity was prepped and draped in the usual fashion.  No tourniquet was used during the procedure. Large bullae were present near circumferentially in the zone of the fracture in addition to a skin tear. Flexing the knee over a tall radiolucent triangle did not successfully correct the malalignment, specifically the procurvatum. Consequently we converted to the semi-extended technique. The knee was placed in 30 degrees of flexion and manipulation of the leg with traction and extension performed to correct sagittal plane alignment. Additional reduction manueavers corrected the coronal place. A 3-cm incision along the medial aspect of the patellar tendon to the patella extending proximally was then made followed by a medial parapatellar incision to access the starting point.Then the curved cannulated awl was advanced into the center of the proximal tibia just medial to the lateral tibial spine and just anterior to the joint surface.  A guidewire was then advanced across the fracture site into the middle of the plafond and checked on AP and LAT images, measuring for nail length on the lateral.  We then performed sequential reaming, reaming up to 11 mm and placing a 10 x 405 mm nail. We were careful to watch alignment throughout and make sure distal locking bolts were anterior to the fibula. Three proximal locks were placed off the jig and checked for position and length, and then two distal locks using perfect circle technique.  An assistant was required for the procedure as my assistant performed the reaming and proximal instrumentation while I held reduction. Standard layered closure was performed. While the patient remained under anesthesia we also carefully applied a mepitel and gauze dressing to the extensive  bullae and skin tear. A student assisted during reaming and nail placement, as well as wound closure.  The patient was taken to the PACU in stable condition after application of sterile gently  compressive dressings.  PROGNOSIS:  The patient will be weightbearing as tolerated with unrestricted motion of the knee and ankle for the next 6 weeks. CAM boot for support as needed. Lovenox likely for DVT prophylaxis. F/u in the office in 10-14 days for removal of sutures. Mepitel may be left in place while guaze is changed over top. Will return to compression hose when skin is able to tolerate.     Astrid Divine. Marcelino Scot, M.D.

## 2021-05-22 NOTE — Progress Notes (Addendum)
PROGRESS NOTE  Mika Anastasi  DOB: May 18, 1931  PCP: [name], MD FUX:[id number]  DOA: 05/20/2021  LOS: 2 days  Hospital Day: 3  Brief narrative: Darin [name] is a 88 y.o. male with PMH significant for HTN, HLD, polymyalgia rheumatica, peripheral neuropathy, arthritis, Dupuytren's contracture on the right hand, GERD, hiatal hernia, osteoporosis who lives at home alone. Patient was brought to the [facility] ED on 2/26 from home after an episode of syncope.  Patient reports while getting up from the toilet, he passed out and fell on the floor.  He started having right leg/ankle pain and also had a small laceration/tear on the left wrist. Patient also reports having similar syncopal episode 2 months ago without any subsequent injury.  He did not seek medical attention at the time.  In the ED patient was afebrile, heart rate 100, blood pressure 154/74. Right knee/tibia/fibula x-ray showed displaced fractures of the proximal tibial diaphysis and proximal fibular metaphysis. Labs showed WBC count elevated to 15.2, hemoglobin 13.2, glucose level 138. Orthopedic surgery was consulted Patient was transferred to [facility] Admitted to hospitalist service  Subjective: Patient was seen and examined this morning. Lying down in bed.  Not in distress.  Waiting for surgery today. On IV hydration.  Pain controlled.   Principal Problem:   Tibia/fibula fracture, right, closed, initial encounter Active Problems:   Hyperlipidemia   Essential hypertension   GERD (gastroesophageal reflux disease)   Syncope   COPD (chronic obstructive pulmonary disease) (HCC)   BPH (benign prostatic hyperplasia)   Assessment and Plan: Tibia/fibula fracture, right, closed, initial encounter -Orthopedic consulted.  Noted a plan for IM nailing today. -Continue pain control  Vasovagal syncope History of hypertension -Patient reportedly passed out while getting up from the bathroom.  Probably an  orthostatic hypotension. -Patient reports syncope 2 months ago while [place]  Patient reports EMS noted normal blood pressure at the time and he did not seek medical attention. -It sounds both incidents were immediately after he stood up from sitting position. -His home meds include benazepril 10 mg daily, HCTZ 12.5 mg daily, amlodipine 5 mg daily. -Currently HCTZ and benazepril are on hold.  Continue amlodipine. -Echocardiogram unremarkable with EF 70 to 75%, no wall motion abnormality, normal pulm artery pressure, no valvular abnormality.. -Continue normal saline at 75 mill per hour  Bilateral carotid artery stenosis -2/27, ultrasound duplex of carotid arteries showed 40-59% stenosis of both carotid arteries  -Medical management with antiplatelet and statin indicated.  Obtain lipid panel.  Hyperlipidemia -Diet controlled   GERD (gastroesophageal reflux disease) -Continue Protonix and Pepcid.  COPD (chronic obstructive pulmonary disease) -Continue bronchodilators   BPH (benign prostatic hyperplasia) -Continue finasteride 5 mg p.o. daily.  Goals of care   Code Status: Full Code    Mobility: Needs PT eval postprocedure  Nutritional status:  Body mass index is 26.41 kg/m.          Diet:  Diet Order             Diet NPO time specified Except for: Sips with Meds  Diet effective midnight                   DVT prophylaxis:  SCDs Start: 05/20/21 1641   Antimicrobials: None Fluid: NS at 61 Consultants: Orthopedics Family Communication: None at bedside  Status is: Inpatient  Continue in-hospital care because: Pending ORIF today Level of care: Telemetry Medical   Dispo: The patient  is from: Home              Anticipated d/c is to: Pending clinical course              Patient currently is not medically stable to d/c.   Difficult to place patient No     Infusions:   sodium chloride 75 mL/hr at 05/22/21 0058    lactated ringers 10 mL/hr at 05/22/21 1042    Scheduled Meds:  [MAR Hold] amLODipine  5 mg Oral QHS   [MAR Hold] vitamin C  500 mg Oral Daily   chlorhexidine  60 mL Topical Once   [MAR Hold] cholecalciferol  1,000 Units Oral QHS   [MAR Hold] famotidine  20 mg Oral QHS   [MAR Hold] finasteride  5 mg Oral Daily   [MAR Hold] gabapentin  100 mg Oral QID   [MAR Hold] magnesium oxide  400 mg Oral Daily   [MAR Hold] mupirocin ointment  1 application Nasal BID   [MAR Hold] pantoprazole  40 mg Oral Daily   [MAR Hold] polyethylene glycol  17 g Oral BID   [MAR Hold] sodium chloride flush  3 mL Intravenous Q12H    PRN meds: [MAR Hold] acetaminophen **OR** [MAR Hold] acetaminophen, [MAR Hold] fentaNYL (SUBLIMAZE) injection, [MAR Hold] oxyCODONE   Antimicrobials: Anti-infectives (From admission, onward)    Start     Dose/Rate Route Frequency Ordered Stop   05/22/21 0800  ceFAZolin (ANCEF) IVPB 2g/100 mL premix        2 g 200 mL/hr over 30 Minutes Intravenous On call to O.R. 05/22/21 0554 05/22/21 1201   05/20/21 1515  ceFAZolin (ANCEF) IVPB 1 g/50 mL premix        1 g 100 mL/hr over 30 Minutes Intravenous  Once 05/20/21 1506 05/20/21 1554       Objective: Vitals:   05/22/21 0359 05/22/21 1025  BP: (!) 125/55 (!) 170/63  Pulse: 81 97  Resp:  16  Temp:  98.7 F (37.1 C)  SpO2: 94% 93%    Intake/Output Summary (Last 24 hours) at 05/22/2021 1429 Last data filed at 05/22/2021 1416 Gross per 24 hour  Intake 1127.51 ml  Output 950 ml  Net 177.51 ml   Filed Weights   05/20/21 2118 05/21/21 0442 05/22/21 0539  Weight: 88.9 kg 88.9 kg 90.8 kg   Weight change: 1.91 kg Body mass index is 26.41 kg/m.   Physical Exam: General exam: Pleasant, elderly Caucasian male.  Not in distress Skin: No rashes, lesions or ulcers. HEENT: Atraumatic, normocephalic, no obvious bleeding Lungs: Clear to auscultation bilaterally CVS: Regular rate and rhythm, no murmur GI/Abd soft, nontender,  nondistended, bowel sound present CNS: Alert, awake, oriented x3 Psychiatry: Mood appropriate Extremities: No pedal edema, no calf tenderness  Data Review: I have personally reviewed the laboratory data and studies available.  F/u labs ordered Unresulted Labs (From admission, onward)     Start     Ordered   Unscheduled  Lipid panel  Add-on,   R       Question:  Specimen collection method  Answer:  Lab=Lab collect   05/22/21 1429   Unscheduled  Basic metabolic panel  Tomorrow morning,   R       Question:  Specimen collection method  Answer:  Lab=Lab collect   05/22/21 1429   Signed and Held  VITAMIN D 25 Hydroxy (Vit-D Deficiency, Fractures)  Tomorrow morning,   R       Question:  Specimen collection  method  Answer:  Lab=Lab collect   Signed and Held   Signed and Held  CBC  Tomorrow morning,   R       Question:  Specimen collection method  Answer:  Lab=Lab collect   Signed and Held            Signed, Terrilee Croak, MD [facility] 05/22/2021

## 2021-05-22 NOTE — Anesthesia Preprocedure Evaluation (Signed)
Anesthesia Evaluation  Patient identified by MRN, date of birth, ID band Patient awake    Reviewed: Allergy & Precautions, NPO status , Patient's Chart, lab work & pertinent test results  History of Anesthesia Complications Negative for: history of anesthetic complications  Airway Mallampati: II  TM Distance: >3 FB Neck ROM: Full    Dental  (+) Teeth Intact, Dental Advisory Given,    Pulmonary neg pulmonary ROS,    breath sounds clear to auscultation       Cardiovascular hypertension, Pt. on medications (-) angina(-) Past MI and (-) CHF  Rhythm:Regular  1. Left ventricular ejection fraction, by estimation, is 70 to 75%. The  left ventricle has hyperdynamic function. The left ventricle has no  regional wall motion abnormalities. Indeterminate diastolic filling due to  E-A fusion.  2. Right ventricular systolic function is normal. The right ventricular  size is normal. There is normal pulmonary artery systolic pressure. The  estimated right ventricular systolic pressure is 02.4 mmHg.  3. The mitral valve is abnormal. Trivial mitral valve regurgitation.  Moderate mitral annular calcification.  4. The aortic valve is tricuspid. There is mild calcification of the  aortic valve. There is mild thickening of the aortic valve. Aortic valve  regurgitation is mild. Aortic valve sclerosis/calcification is present,  without any evidence of aortic  stenosis.  5. The inferior vena cava is normal in size with greater than 50%  respiratory variability, suggesting right atrial pressure of 3 mmHg.   Comparison(s): Compared to prior TTE in 2018, there is no significant    Neuro/Psych PSYCHIATRIC DISORDERS Depression  Neuromuscular disease    GI/Hepatic Neg liver ROS, hiatal hernia, GERD  Medicated and Controlled,  Endo/Other  negative endocrine ROS  Renal/GU negative Renal ROSLab Results      Component                Value                Date                      CREATININE               0.86                05/21/2021                Musculoskeletal  (+) Arthritis , Right Tibia Fracture   Abdominal   Peds  Hematology  (+) Blood dyscrasia, anemia , Lab Results      Component                Value               Date                      WBC                      9.1                 05/21/2021                HGB                      12.3 (L)            05/21/2021                HCT  36.3 (L)            05/21/2021                MCV                      93.3                05/21/2021                PLT                      179                 05/21/2021              Anesthesia Other Findings   Reproductive/Obstetrics                             Anesthesia Physical Anesthesia Plan  ASA: 2  Anesthesia Plan: General   Post-op Pain Management: Ofirmev IV (intra-op)*   Induction: Intravenous  PONV Risk Score and Plan: 2 and Ondansetron and Dexamethasone  Airway Management Planned: Oral ETT  Additional Equipment: None  Intra-op Plan:   Post-operative Plan: Extubation in OR  Informed Consent: I have reviewed the patients History and Physical, chart, labs and discussed the procedure including the risks, benefits and alternatives for the proposed anesthesia with the patient or authorized representative who has indicated his/her understanding and acceptance.     Dental advisory given  Plan Discussed with: CRNA and Anesthesiologist  Anesthesia Plan Comments:         Anesthesia Quick Evaluation

## 2021-05-22 NOTE — Transfer of Care (Signed)
Immediate Anesthesia Transfer of Care Note  Patient: Tevion Laforge  Procedure(s) Performed: INTRAMEDULLARY (IM) NAIL TIBIAL (Right)  Patient Location: PACU  Anesthesia Type:General  Level of Consciousness: awake and alert   Airway & Oxygen Therapy: Patient Spontanous Breathing and Patient connected to nasal cannula oxygen  Post-op Assessment: Report given to RN and Post -op Vital signs reviewed and stable  Post vital signs: Reviewed and stable  Last Vitals:  Vitals Value Taken Time  BP 149/103 05/22/21 1430  Temp    Pulse 90 05/22/21 1431  Resp 24 05/22/21 1431  SpO2 96 % 05/22/21 1431  Vitals shown include unvalidated device data.  Last Pain:  Vitals:   05/22/21 1025  TempSrc: Oral  PainSc: 2       Patients Stated Pain Goal: 0 (21/58/72 7618)  Complications: No notable events documented.

## 2021-05-22 NOTE — TOC CAGE-AID Note (Signed)
Transition of Care [facility]) - CAGE-AID Screening   Patient Details  Name: Darin [name] MRN: [id number] Date of Birth: 04/29/1931  Transition of Care [facility]) CM/SW Contact:    Army Melia, RN Phone Number:[phone] 05/22/2021, 12:16 AM   Clinical Narrative:  Patient arrives after a syncopal episode in the bathroom at home, resulting in left tib/fib fx. Reports he occasionally drinks wine, no drug use. Declines resources.  CAGE-AID Screening:    Have You Ever Felt You Ought to Cut Down on Your Drinking or Drug Use?: No Have People Annoyed You By Critizing Your Drinking Or Drug Use?: No Have You Felt Bad Or Guilty About Your Drinking Or Drug Use?: No Have You Ever Had a Drink or Used Drugs First Thing In The Morning to Steady Your Nerves or to Get Rid of a Hangover?: No CAGE-AID Score: 0  Substance Abuse Education Offered: No (declined resources - drinks wine occassionally)

## 2021-05-22 NOTE — Anesthesia Procedure Notes (Signed)
Procedure Name: Intubation Date/Time: 05/22/2021 12:00 PM Performed by: Lorie Phenix, CRNA Pre-anesthesia Checklist: Patient identified, Emergency Drugs available, Suction available and Patient being monitored Patient Re-evaluated:Patient Re-evaluated prior to induction Oxygen Delivery Method: Circle system utilized Preoxygenation: Pre-oxygenation with 100% oxygen Induction Type: IV induction Ventilation: Mask ventilation without difficulty Laryngoscope Size: Mac and 4 Grade View: Grade I Tube type: Oral Tube size: 7.5 mm Number of attempts: 1 Airway Equipment and Method: Stylet Placement Confirmation: ETT inserted through vocal cords under direct vision, positive ETCO2 and breath sounds checked- equal and bilateral Secured at: 23 cm Tube secured with: Tape Dental Injury: Teeth and Oropharynx as per pre-operative assessment

## 2021-05-22 NOTE — Progress Notes (Signed)
ANTICOAGULATION CONSULT NOTE - Initial Consult  Pharmacy Consult for Lovenox Indication: VTE prophylaxis  Allergies  Allergen Reactions   Statins Other (See Comments)    Muscle pain and upset stomach    Patient Measurements: Height: 6\' 1"  (185.4 cm) Weight: 90.8 kg (200 lb 3.2 oz) IBW/kg (Calculated) : 79.9  Vital Signs: Temp: 98.3 F (36.8 C) (02/28 1430) Temp Source: Oral (02/28 1025) BP: 146/63 (02/28 1515) Pulse Rate: 94 (02/28 1515)  Labs: Recent Labs    05/20/21 1325 05/20/21 1340 05/21/21 0306  HGB 13.2 13.3 12.3*  HCT 39.1 39.0 36.3*  PLT 184  --  179  CREATININE 0.80 0.80 0.86    Estimated Creatinine Clearance: 68.4 mL/min (by C-G formula based on SCr of 0.86 mg/dL).   Medical History: Past Medical History:  Diagnosis Date   Arthritis    DUPUYTREN'S CONTRACTURE, RIGHT 07/04/2009   Annotation: 4th digit Qualifier: Diagnosis of  By: Arnoldo Morale MD, Balinda Quails    GERD (gastroesophageal reflux disease)    H/O hiatal hernia    "FOR YEARS" " NO PROBLEM"   Hyperlipidemia    Hypertension    Knee pain    Neuropathy    PERIPHERAL    Osteoporosis    PMR (polymyalgia rheumatica) (HCC)    Polymyalgia (HCC) 5 YEARS    Medications:  Medications Prior to Admission  Medication Sig Dispense Refill Last Dose   amLODipine (NORVASC) 5 MG tablet TAKE 1 TABLET DAILY (Patient taking differently: Take by mouth at bedtime.) 90 tablet 3 05/19/2021   benazepril-hydrochlorthiazide (LOTENSIN HCT) 10-12.5 MG tablet TAKE 1 TABLET DAILY (Patient taking differently: Take 1 tablet by mouth daily.) 90 tablet 1 05/19/2021   famotidine (PEPCID) 20 MG tablet TAKE 1 TABLET AT BEDTIME (Patient taking differently: Take 20 mg by mouth at bedtime.) 90 tablet 1 05/19/2021   finasteride (PROSCAR) 5 MG tablet TAKE 1 TABLET EACH DAY. (Patient taking differently: Take 5 mg by mouth daily.) 90 tablet 3 05/20/2021   gabapentin (NEURONTIN) 100 MG capsule TAKE 1 CAPSULE 4 TIMES     DAILY (Patient taking  differently: Take 100 mg by mouth 4 (four) times daily.) 360 capsule 3 05/20/2021   magnesium oxide (MAG-OX) 400 MG tablet Take 400 mg by mouth daily.   05/20/2021   polyethylene glycol (MIRALAX / GLYCOLAX) packet Take 17 g by mouth daily as needed for moderate constipation. (Patient taking differently: Take 17 g by mouth 2 (two) times daily.)   05/20/2021   triamcinolone ointment (KENALOG) 0.1 % Apply 1 application topically 2 (two) times daily. To affected area (Patient taking differently: Apply 1 application topically 2 (two) times daily as needed (rash/irritation).) 30 g 1 05/19/2021   vitamin C (ASCORBIC ACID) 500 MG tablet Take 500 mg by mouth daily.   05/20/2021   VITAMIN D PO Take 2,500 Units by mouth at bedtime.   05/19/2021    Assessment: 88 y.o. M s/p R tibial fx fixation. Pharmacy consulted for Lovenox for VTE prophylaxis. CBC stable.  Goal of Therapy:  Prevention of VTE Monitor platelets by anticoagulation protocol: Yes   Plan:  Lovenox 40mg  SQ daily - first dose 2/29 1000 Pharmacy will sign off - please reconsult if needed  Sherlon Handing, PharmD, BCPS Please see amion for complete clinical pharmacist phone list 05/22/2021,3:36 PM

## 2021-05-23 ENCOUNTER — Encounter (HOSPITAL_COMMUNITY): Payer: Self-pay | Admitting: Orthopedic Surgery

## 2021-05-23 LAB — CBC
HCT: 31.2 % — ABNORMAL LOW (ref 39.0–52.0)
Hemoglobin: 10.7 g/dL — ABNORMAL LOW (ref 13.0–17.0)
MCH: 31.8 pg (ref 26.0–34.0)
MCHC: 34.3 g/dL (ref 30.0–36.0)
MCV: 92.6 fL (ref 80.0–100.0)
Platelets: 142 10*3/uL — ABNORMAL LOW (ref 150–400)
RBC: 3.37 MIL/uL — ABNORMAL LOW (ref 4.22–5.81)
RDW: 13.2 % (ref 11.5–15.5)
WBC: 6.6 10*3/uL (ref 4.0–10.5)
nRBC: 0 % (ref 0.0–0.2)

## 2021-05-23 LAB — BASIC METABOLIC PANEL
Anion gap: 6 (ref 5–15)
BUN: 10 mg/dL (ref 8–23)
CO2: 28 mmol/L (ref 22–32)
Calcium: 8.9 mg/dL (ref 8.9–10.3)
Chloride: 100 mmol/L (ref 98–111)
Creatinine, Ser: 0.68 mg/dL (ref 0.61–1.24)
GFR, Estimated: >60 mL/min (ref >60)
Glucose, Bld: 129 mg/dL — ABNORMAL HIGH (ref 70–99)
Potassium: 4.4 mmol/L (ref 3.5–5.1)
Sodium: 134 mmol/L — ABNORMAL LOW (ref 135–145)

## 2021-05-23 LAB — HEMOGLOBIN A1C
Hgb A1c MFr Bld: 5.7 % — ABNORMAL HIGH (ref 4.8–5.6)
Mean Plasma Glucose: 116.89 mg/dL

## 2021-05-23 LAB — GLUCOSE, CAPILLARY
Glucose-Capillary: 118 mg/dL — ABNORMAL HIGH (ref 70–99)
Glucose-Capillary: 193 mg/dL — ABNORMAL HIGH (ref 70–99)

## 2021-05-23 MED ORDER — ADULT MULTIVITAMIN W/MINERALS CH
1.0000 | ORAL_TABLET | Freq: Every day | ORAL | Status: DC
Start: 2021-05-23 — End: 2021-05-24
  Administered 2021-05-23 – 2021-05-24 (×2): 1 via ORAL
  Filled 2021-05-23 (×2): qty 1

## 2021-05-23 MED ORDER — ENSURE ENLIVE PO LIQD
237.0000 mL | Freq: Two times a day (BID) | ORAL | Status: DC
  Administered 2021-05-24: 237 mL via ORAL

## 2021-05-23 MED FILL — Fentanyl Citrate Preservative Free (PF) Inj 100 MCG/2ML: INTRAMUSCULAR | Qty: 1 | Status: AC

## 2021-05-23 NOTE — Care Management Important Message (Signed)
Important Message ? ?Patient Details  ?Name: Darin [name] ?MRN: [id number] ?Date of Birth: December 09, 1931 ? ? ?Medicare Important Message Given:  Yes ? ? ? ? ?Levada Dy  Jessikah Dicker-Martin ?05/23/2021, 1:59 PM ?

## 2021-05-23 NOTE — Progress Notes (Signed)
Physical Therapy Evaluation ?Patient Details ?Name: Darin [name] ?MRN: [id number] ?DOB: 1931/04/01 ?Today's Date: 05/23/2021 ? ?History of Present Illness ? 88 yo male with mechanical fall was admitted on 2/26, found to have R tib fib fracture with R tib IM nailing done 2/28 as well as bolting fibula.  Pt is NWB on RLE now with 20 deg knee flexion contracture from OA pre-existing.   PMHx:  HTN, HLD, polymyalgia rheumatica, PN, osteoporosis, dupuytren's contracture R hand, GERD, hiatal hernia,  ?Clinical Impression ? Pt was seen for mobility on RW to stand and attempt steps, but is struggling to keep NWB on RLE to just rise to stand with two person help.  Pt is limited for help at home, living alone and having already relied on help for yard and home.  He is appropriate for SNF care level as he has greater deficits of strength, balance and gait quality, and will benefit from longer time in rehab to recover safety and independence for living alone.  Follow acute PT goals as are outlined below.  ?   ? ?Recommendations for follow up therapy are one component of a multi-disciplinary discharge planning process, led by the attending physician.  Recommendations may be updated based on patient status, additional functional criteria and insurance authorization. ? ?Follow Up Recommendations Skilled nursing-short term rehab (<3 hours/day) ? ?  ?Assistance Recommended at Discharge Frequent or constant Supervision/Assistance  ?Patient can return home with the following ? Two people to help with walking and/or transfers;A lot of help with bathing/dressing/bathroom;Assistance with cooking/housework;Assist for transportation;Help with stairs or ramp for entrance ? ?  ?Equipment Recommendations None recommended by PT  ?Recommendations for Other Services ?    ?  ?Functional Status Assessment Patient has had a recent decline in their functional status and demonstrates the ability to make significant improvements in function in a  reasonable and predictable amount of time.  ? ?  ?Precautions / Restrictions Precautions ?Precautions: Fall ?Precaution Comments: unrestricted knee ROM ?Restrictions ?Weight Bearing Restrictions: Yes ?RLE Weight Bearing: Non weight bearing  ? ?  ? ?Mobility ? Bed Mobility ?Overal bed mobility: Needs Assistance ?Bed Mobility: Supine to Sit ?  ?  ?Supine to sit: Min assist, +2 for physical assistance, +2 for safety/equipment, HOB elevated ?  ?  ?General bed mobility comments: support to sit upright, to scoot forward ?  ? ?Transfers ?Overall transfer level: Needs assistance ?Equipment used: Rolling walker (2 wheels), 1 person hand held assist, 2 person hand held assist ?Transfers: Sit to/from Stand ?Sit to Stand: +2 physical assistance, +2 safety/equipment, Min assist ?  ?  ?  ?  ? Lateral/Scoot Transfers: +2 physical assistance, +2 safety/equipment, Min assist ?General transfer comment: repeated reminding about hand placement and safety of use of RLE esp to avoid WB ?  ? ?Ambulation/Gait ?  ?  ?  ?  ?  ?  ?  ?General Gait Details: pt did some minor scooting on L foot standing bedside, with pt slding foot to try to take a step unsuccessfully ? ?Stairs ?  ?  ?  ?  ?  ? ?Wheelchair Mobility ?  ? ?Modified Rankin (Stroke Patients Only) ?  ? ?  ? ?Balance Overall balance assessment: Needs assistance ?Sitting-balance support: Bilateral upper extremity supported ?Sitting balance-Leahy Scale: Fair ?Sitting balance - Comments: tactile and verbal correction of posture which pt can then maintain ?Postural control: Posterior lean ?Standing balance support: Bilateral upper extremity supported, During functional activity ?Standing balance-Leahy Scale: Poor ?  ?  ?  ?  ?  ?  ?  ?  ?  ?  ?  ?  ?   ? ? ? ?  Pertinent Vitals/Pain Pain Assessment ?Pain Assessment: Faces ?Faces Pain Scale: Hurts little more ?Pain Location: RLE at knee ?Pain Descriptors / Indicators: Operative site guarding, Guarding, Grimacing ?Pain Intervention(s):  Monitored during session, Repositioned, Ice applied  ? ? ?Home Living Family/patient expects to be discharged to:: Private residence ?Living Arrangements: Alone ?Available Help at Discharge: Friend(s);Available PRN/intermittently;Neighbor ?Type of Home: House ?Home Access: Level entry ?  ?  ?  ?Home Layout: One level ?Home Equipment: Rolling Walker (2 wheels);Grab bars - toilet;Grab bars - tub/shower;Shower seat ?   ?  ?Prior Function Prior Level of Function : Needs assist;Driving ?  ?  ?  ?  ?  ?  ?Mobility Comments: walks with RW ?ADLs Comments: assisted for heavy housework, yardwork ?  ? ? ?Hand Dominance  ? Dominant Hand: Right ? ?  ?Extremity/Trunk Assessment  ? Upper Extremity Assessment ?Upper Extremity Assessment: Overall WFL for tasks assessed ?  ? ?Lower Extremity Assessment ?Lower Extremity Assessment: Defer to PT evaluation ?RLE Deficits / Details: R knee ROM permitted but is NWB, 20 deg flexion contracture ?RLE: Unable to fully assess due to pain ?RLE Coordination: decreased gross motor ?  ? ?Cervical / Trunk Assessment ?Cervical / Trunk Assessment: Normal  ?Communication  ? Communication: HOH  ?Cognition Arousal/Alertness: Awake/alert ?Behavior During Therapy: [facility] for tasks assessed/performed ?Overall Cognitive Status: Within Functional Limits for tasks assessed ?  ?  ?  ?  ?  ?  ?  ?  ?  ?  ?  ?  ?  ?  ?  ?  ?  ?  ?  ? ?  ?General Comments General comments (skin integrity, edema, etc.): pt is demonstrating a limited tolerance for standing, light headed but BP was [facility].  Pt is not standing long enough to try to get orthostatic values ? ?  ?Exercises    ? ?Assessment/Plan  ?  ?PT Assessment Patient needs continued PT services  ?PT Problem List Decreased range of motion;Decreased strength;Decreased activity tolerance;Decreased balance;Decreased mobility;Decreased coordination;Decreased knowledge of use of DME;Decreased safety awareness;Cardiopulmonary status limiting activity;Decreased skin  integrity;Pain ? ?   ?  ?PT Treatment Interventions DME instruction;Gait training;Stair training;Functional mobility training;Therapeutic activities;Therapeutic exercise;Balance training;Neuromuscular re-education;Patient/family education   ? ?PT Goals (Current goals can be found in the Care Plan section)  ?Acute Rehab PT Goals ?Patient Stated Goal: to get home and be independent again ?PT Goal Formulation: With patient ?Time For Goal Achievement: 06/06/21 ?Potential to Achieve Goals: Good ? ?  ?Frequency Min 3X/week ?  ? ? ?Co-evaluation   ?Reason for Co-Treatment: For patient/therapist safety ?  ?OT goals addressed during session: ADL's and self-care ?  ? ? ?  ?AM-PAC PT "6 Clicks" Mobility  ?Outcome Measure Help needed turning from your back to your side while in a flat bed without using bedrails?: A Little ?Help needed moving from lying on your back to sitting on the side of a flat bed without using bedrails?: A Lot ?Help needed moving to and from a bed to a chair (including a wheelchair)?: A Lot ?Help needed standing up from a chair using your arms (e.g., wheelchair or bedside chair)?: A Lot ?Help needed to walk in hospital room?: Total ?Help needed climbing 3-5 steps with a railing? : Total ?6 Click Score: 11 ? ?  ?End of Session Equipment Utilized During Treatment: Gait belt ?Activity Tolerance: Patient tolerated treatment well ?Patient left: with call bell/phone within reach;in chair;with chair alarm set ?Nurse Communication: Mobility status ?PT Visit Diagnosis: Unsteadiness on feet (  R26.81);Muscle weakness (generalized) (M62.81);Difficulty in walking, not elsewhere classified (R26.2);Pain ?Pain - Right/Left: Right ?Pain - part of body: Ankle and joints of foot ?  ? ?Time: 2878-6767 ?PT Time Calculation (min) (ACUTE ONLY): 40 min ? ? ?Charges:   PT Evaluation ?$PT Eval Moderate Complexity: 1 Mod ?PT Treatments ?$Therapeutic Activity: 8-22 mins ?  ?   ? ?Ramond Dial ?05/23/2021, 2:48 PM ? ?Mee Hives, PT  PhD ?Acute Rehab Dept. Number: [facility] [phone] and [place] [phone] ? ?

## 2021-05-23 NOTE — Progress Notes (Signed)
?PROGRESS NOTE ? ?Theda Belfast  ?DOB: 1931/03/30  ?PCP: [name], MD ?HYW:[id number]  ?DOA: 05/20/2021 ? LOS: 3 days  ?Hospital Day: 4 ? ?Brief narrative: ?Endre Coutts is a 88 y.o. male with PMH significant for HTN, HLD, polymyalgia rheumatica, peripheral neuropathy, arthritis, Dupuytren's contracture on the right hand, GERD, hiatal hernia, osteoporosis who lives at home alone. ?Patient was brought to the [facility] ED on 2/26 from home after an episode of syncope.  Patient reports while getting up from the toilet, he passed out and fell on the floor.  He started having right leg/ankle pain and also had a small laceration/tear on the left wrist. ?Patient also reports having similar syncopal episode 2 months ago without any subsequent injury.  He did not seek medical attention at the time. ? ?In the ED patient was afebrile, heart rate 100, blood pressure 154/74. ?Right knee/tibia/fibula x-ray showed displaced fractures of the proximal tibial diaphysis and proximal fibular metaphysis. ?Labs showed WBC count elevated to 15.2, hemoglobin 13.2, glucose level 138. ?Orthopedic surgery was consulted ?Patient was transferred to [facility] ?Admitted to hospitalist service ? ?Subjective: ?Patient was seen and examined this morning. ?Lying down in bed.  Not in distress.  Surgery yesterday.  Pain controlled. ?We talked about his elevated LDL, carotid artery stenosis.  He states he is allergic to statin.  Wants to discuss with his PCP about starting any antiplatelet. ? ?Principal Problem: ?  Tibia/fibula fracture, right, closed, initial encounter ?Active Problems: ?  Hyperlipidemia ?  Essential hypertension ?  GERD (gastroesophageal reflux disease) ?  Syncope ?  COPD (chronic obstructive pulmonary disease) ([place]) ?  BPH (benign prostatic hyperplasia) ?  ?Assessment and Plan: ?Tibia/fibula fracture, right, closed, initial encounter ?-Orthopedic consulted.   ?-Underwent IM nailing on 2/28. ?-Continue pain control ?-DVT  prophylaxis ? ?Vasovagal syncope ?History of hypertension ?-Patient reportedly passed out while getting up from the bathroom.  Probably had orthostatic hypotension. Patient reports syncope 2 months ago [place]  Patient reports EMS noted normal blood pressure at the time and he did not seek medical attention. ?-His home meds include benazepril 10 mg daily, HCTZ 12.5 mg daily, amlodipine 5 mg daily. ?-Currently HCTZ and benazepril are on hold.  Continue amlodipine. ?-Echocardiogram unremarkable with EF 70 to 75%, no wall motion abnormality, normal pulm artery pressure, no valvular abnormality.. ?-Adequately hydrated.  Can stop IV fluid today. ? ?Bilateral carotid artery stenosis ?Hyperlipidemia ?-2/27, ultrasound duplex of carotid arteries showed 40-59% stenosis of both carotid arteries. ?-Lipid panel with HDL 73, LDL 125.  With bilateral carotid artery stenosis, patient would benefit from antiplatelet and statin. ?-Patient states he is allergic to statin.  He also states that he was previously on prophylactic aspirin but it was recently discontinued by his PCP.  He wants to discuss with PCP before going back to using them again. ? ?GERD (gastroesophageal reflux disease) ?-Continue Protonix and Pepcid. ? ?COPD (chronic obstructive pulmonary disease) ?-Continue bronchodilators ?  ?BPH (benign prostatic hyperplasia) ?-Continue finasteride 5 mg p.o. daily. ? ?Goals of care ?  Code Status: Full Code  ? ? ?Mobility: Needs PT eval postprocedure ? ?Nutritional status:  ?Body mass index is 26.41 kg/m?Marland Kitchen  ?Nutrition Problem: Increased nutrient needs ?Etiology: post-op healing ?Signs/Symptoms: estimated needs ? ? ? ? ?Diet:  ?Diet Order   ? ?       ?  Diet regular Room service appropriate? Yes; Fluid consistency: Thin  Diet effective now       ?  ? ?  ?  ? ?  ? ? ?  DVT prophylaxis:  ?enoxaparin (LOVENOX) injection 40 mg Start: 05/23/21 1000 ?SCDs Start: 05/20/21 1641 ?  ?Antimicrobials:  None ?Fluid: Stop fluid ?Consultants: Orthopedics ?Family Communication: None at bedside ? ?Status is: Inpatient ? ?Continue in-hospital care because: POD1, pending PT ?Level of care: Telemetry Medical  ? ?Dispo: The patient is from: Home ?             Anticipated d/c is to: Pending clinical course ?             Patient currently is not medically stable to d/c. ?  Difficult to place patient No ? ? ? ? ?Infusions:  ? ? ? ?Scheduled Meds: ? acetaminophen  1,000 mg Oral Q8H  ? amLODipine  5 mg Oral QHS  ? vitamin C  500 mg Oral Daily  ? cholecalciferol  1,000 Units Oral QHS  ? enoxaparin (LOVENOX) injection  40 mg Subcutaneous Daily  ? famotidine  20 mg Oral QHS  ? feeding supplement  237 mL Oral BID BM  ? finasteride  5 mg Oral Daily  ? gabapentin  100 mg Oral QID  ? magnesium oxide  400 mg Oral Daily  ? multivitamin with minerals  1 tablet Oral Daily  ? mupirocin ointment  1 application Nasal BID  ? pantoprazole  40 mg Oral Daily  ? polyethylene glycol  17 g Oral BID  ? sodium chloride flush  3 mL Intravenous Q12H  ? ? ?PRN meds: ?fentaNYL (SUBLIMAZE) injection, oxyCODONE  ? ?Antimicrobials: ?Anti-infectives (From admission, onward)  ? ? Start     Dose/Rate Route Frequency Ordered Stop  ? 05/22/21 1630  ceFAZolin (ANCEF) IVPB 2g/100 mL premix       ? 2 g ?200 mL/hr over 30 Minutes Intravenous Every 8 hours 05/22/21 1531 05/23/21 0633  ? 05/22/21 0800  ceFAZolin (ANCEF) IVPB 2g/100 mL premix       ? 2 g ?200 mL/hr over 30 Minutes Intravenous On call to O.R. 05/22/21 0554 05/22/21 1201  ? 05/20/21 1515  ceFAZolin (ANCEF) IVPB 1 g/50 mL premix       ? 1 g ?100 mL/hr over 30 Minutes Intravenous  Once 05/20/21 1506 05/20/21 1554  ? ?  ? ? ?Objective: ?Vitals:  ? 05/23/21 0356 05/23/21 0734  ?BP: (!) 141/63 106/89  ?Pulse:  89  ?Resp: 20 20  ?Temp: 97.6 ?F (36.4 ?C) 98 ?F (36.7 ?C)  ?SpO2: 97% 91%  ? ? ?Intake/Output Summary (Last 24 hours) at 05/23/2021 1304 ?Last data filed at 05/23/2021 1007 ?Gross per 24 hour  ?Intake  2684.94 ml  ?Output 2800 ml  ?Net -115.06 ml  ? ?Filed Weights  ? 05/20/21 2118 05/21/21 0442 05/22/21 0539  ?Weight: 88.9 kg 88.9 kg 90.8 kg  ? ?Weight change:  ?Body mass index is 26.41 kg/m?.  ? ?Physical Exam: ?General exam: Pleasant, elderly Caucasian male.  Not in distress ?Skin: No rashes, lesions or ulcers. ?HEENT: Atraumatic, normocephalic, no obvious bleeding ?Lungs: Clear to auscultation bilaterally ?CVS: Regular rate and rhythm, no murmur ?GI/Abd soft, nontender, nondistended, bowel sound present ?CNS: Alert, awake, oriented x3 ?Psychiatry: Mood appropriate ?Extremities: No pedal edema, no calf tenderness ? ?Data Review: I have personally reviewed the laboratory data and studies available. ? ?F/u labs ordered ?Unresulted Labs (From admission, onward)  ? ?  Start     Ordered  ? 05/24/21 0500  CBC with Differential/Platelet  Daily,   R     ?Question:  Specimen collection method  Answer:  Lab=Lab collect  ?  05/23/21 0843  ? 05/24/21 4481  Basic metabolic panel  Daily,   R     ?Question:  Specimen collection method  Answer:  Lab=Lab collect  ? 05/23/21 0843  ? 05/23/21 0500  VITAMIN D 25 Hydroxy (Vit-D Deficiency, Fractures)  Tomorrow morning,   R       ?Question:  Specimen collection method  Answer:  Lab=Lab collect  ? 05/22/21 1531  ? ?  ?  ? ?  ? ? ?Signed, ?Terrilee Croak, MD ?[facility] ?05/23/2021 ? ? ? ? ? ? ? ? ? ? ?  ?

## 2021-05-23 NOTE — Evaluation (Signed)
Occupational Therapy Evaluation Patient Details Name: Darin [name] MRN: [id number] DOB: [birth year] Today's Date: 05/23/2021   History of Present Illness 88 yo male with mechanical fall was admitted on 2/26, found to have R tib fib fracture with R tib IM nailing done 2/28 as well as bolting fibula.  Pt is NWB on RLE now with 20 deg knee flexion contracture from OA pre-existing.   PMHx:  HTN, HLD, polymyalgia rheumatica, PN, osteoporosis, dupuytren's contracture R hand, GERD, hiatal hernia,   Clinical Impression   Pt was functioning modified independently in mobility with RW and ADLs and light IADLs. He lives alone and was driving. Pt has assist for heavy housework and yardwork. Pt presents with R LE pain, impaired balance, generalized weakness and decreased activity tolerance. He currently requires +2 assist for all mobility and was unable to maintain NWB in order to hop with RW, used A-P transfer to chair. Pt needs set up to total assist for ADLs. He will need post acute rehab in SNF prior to return home.      Recommendations for follow up therapy are one component of a multi-disciplinary discharge planning process, led by the attending physician.  Recommendations may be updated based on patient status, additional functional criteria and insurance authorization.   Follow Up Recommendations  Skilled nursing-short term rehab (<3 hours/day)    Assistance Recommended at Discharge Frequent or constant Supervision/Assistance  Patient can return home with the following Two people to help with walking and/or transfers;A lot of help with bathing/dressing/bathroom;Assist for transportation;Help with stairs or ramp for entrance    Functional Status Assessment  Patient has had a recent decline in their functional status and demonstrates the ability to make significant improvements in function in a reasonable and predictable amount of time.  Equipment Recommendations  Wheelchair (measurements  OT);Wheelchair cushion (measurements OT)    Recommendations for Other Services       Precautions / Restrictions Precautions Precautions: Fall Precaution Comments: unrestricted knee ROM Restrictions Weight Bearing Restrictions: Yes RLE Weight Bearing: Non weight bearing      Mobility Bed Mobility Overal bed mobility: Needs Assistance Bed Mobility: Supine to Sit     Supine to sit: Min assist, +2 for physical assistance     General bed mobility comments: cues for technique, assist to support L LE and to raise trunk    Transfers Overall transfer level: Needs assistance Equipment used: Rolling walker (2 wheels), None Transfers: Sit to/from Stand, Bed to chair/wheelchair/BSC Sit to Stand: +2 physical assistance, Min assist, From elevated surface          Lateral/Scoot Transfers: +2 physical assistance, Mod assist General transfer comment: cues for hand placement and NWB status, assist to rise and steady, pt momentarily toe touch weightbearing with sit to stand, pt unable to hop or pivot, instead used A-P method to assist pt from bed to recliner with LEs supported on bed.      Balance Overall balance assessment: Needs assistance Sitting-balance support: Bilateral upper extremity supported, No upper extremity supported Sitting balance-Leahy Scale: Fair Sitting balance - Comments: pt with posterior LOB with long sitting during A-P transfer Postural control: Posterior lean Standing balance support: Bilateral upper extremity supported Standing balance-Leahy Scale: Poor                             ADL either performed or assessed with clinical judgement   ADL Overall ADL's : Needs assistance/impaired Eating/Feeding: Independent;Sitting   Grooming: Set  up;Sitting   Upper Body Bathing: Minimal assistance;Sitting   Lower Body Bathing: +2 for physical assistance;Total assistance;Sit to/from stand   Upper Body Dressing : Minimal assistance;Sitting   Lower Body  Dressing: Total assistance;Bed level     Toilet Transfer Details (indicate cue type and reason): simulated to chair           General ADL Comments: Instructed in use of IS.     Vision Ability to See in Adequate Light: 0 Adequate Patient Visual Report: No change from baseline       Perception     Praxis      Pertinent Vitals/Pain Pain Assessment Pain Assessment: Faces Faces Pain Scale: Hurts little more Pain Location: R LE Pain Descriptors / Indicators: Grimacing, Guarding Pain Intervention(s): Monitored during session, Premedicated before session, Repositioned     Hand Dominance Right   Extremity/Trunk Assessment Upper Extremity Assessment Upper Extremity Assessment: Overall WFL for tasks assessed   Lower Extremity Assessment Lower Extremity Assessment: Defer to PT evaluation   Cervical / Trunk Assessment Cervical / Trunk Assessment: Normal   Communication Communication Communication: HOH   Cognition Arousal/Alertness: Awake/alert Behavior During Therapy: WFL for tasks assessed/performed Overall Cognitive Status: Within Functional Limits for tasks assessed                                       General Comments       Exercises     Shoulder Instructions      Home Living Family/patient expects to be discharged to:: Private residence Living Arrangements: Alone Available Help at Discharge: Friend(s);Available PRN/intermittently;Neighbor Type of Home: House Home Access: Level entry     Home Layout: One level     Bathroom Shower/Tub: Occupational psychologist: Handicapped height     Home Equipment: Conservation officer, nature (2 wheels);Grab bars - toilet;Grab bars - tub/shower;Shower seat          Prior Functioning/Environment Prior Level of Function : Needs assist;Driving             Mobility Comments: walks with RW ADLs Comments: assisted for heavy housework, yardwork        OT Problem List: Decreased  strength;Decreased activity tolerance;Impaired balance (sitting and/or standing);Decreased knowledge of use of DME or AE;Pain      OT Treatment/Interventions: Self-care/ADL training;DME and/or AE instruction;Therapeutic activities;Patient/family education;Balance training    OT Goals(Current goals can be found in the care plan section) Acute Rehab OT Goals OT Goal Formulation: With patient Time For Goal Achievement: 06/06/21 Potential to Achieve Goals: Good ADL Goals Pt Will Perform Upper Body Bathing: with set-up;sitting Pt Will Perform Upper Body Dressing: with set-up;sitting Pt Will Transfer to Toilet: with mod assist;stand pivot transfer;bedside commode Pt Will Perform Toileting - Clothing Manipulation and hygiene: with min assist;sitting/lateral leans Pt/caregiver will Perform Home Exercise Program: Increased strength;Both right and left upper extremity;With theraband;Independently (level 3) Additional ADL Goal #1: Pt will perform bed mobility with min assist in preparation for ADLs.  OT Frequency: Min 2X/week    Co-evaluation PT/OT/SLP Co-Evaluation/Treatment: Yes Reason for Co-Treatment: For patient/therapist safety   OT goals addressed during session: ADL's and self-care      AM-PAC OT "6 Clicks" Daily Activity     Outcome Measure Help from another person eating meals?: None Help from another person taking care of personal grooming?: A Little Help from another person toileting, which includes using toliet, bedpan, or urinal?:  A Lot Help from another person bathing (including washing, rinsing, drying)?: A Lot Help from another person to put on and taking off regular upper body clothing?: A Little Help from another person to put on and taking off regular lower body clothing?: Total 6 Click Score: 15   End of Session Equipment Utilized During Treatment: Gait belt;Rolling walker (2 wheels) Nurse Communication: Mobility status  Activity Tolerance: Patient limited by  fatigue Patient left: in chair;with call bell/phone within reach  OT Visit Diagnosis: Unsteadiness on feet (R26.81);Pain;Muscle weakness (generalized) (M62.81);History of falling (Z91.81) Pain - Right/Left: Right Pain - part of body: Leg                Time: 7673-4193 OT Time Calculation (min): 36 min Charges:  OT General Charges $OT Visit: 1 Visit OT Evaluation $OT Eval Moderate Complexity: 1 Mod  Nestor Lewandowsky, OTR/L Acute Rehabilitation Services Pager: [phone] Office: [phone]   Malka So 05/23/2021, 1:17 PM

## 2021-05-23 NOTE — Progress Notes (Signed)
Initial Nutrition Assessment ? ?DOCUMENTATION CODES:  ?Not applicable ? ?INTERVENTION:  ?-Ensure Enlive po BID, each supplement provides 350 kcal and 20 grams of protein. ?-MVI with minerals daily ? ?NUTRITION DIAGNOSIS:  ?Increased nutrient needs related to post-op healing as evidenced by estimated needs. ? ?GOAL:  ?Patient will meet greater than or equal to 90% of their needs ? ?MONITOR:  ?PO intake, Supplement acceptance, Labs, Weight trends, I & O's ? ?REASON FOR ASSESSMENT:  ?Consult ?Assessment of nutrition requirement/status ? ?ASSESSMENT:  ?Pt with PMH significant for HTN, HLD, polymyalgia rheumatica, peripheral neuropathy, arthritis, Dupuytren's contracture on Darin R hand, GERD, hiatal hernia, osteoporosis admitted with closed R tibia/fibula fracture ? ?2/28 - s/p IM nailing  ? ?Pt unavailable at time of RD visit x 2 attempts. Unable to obtain diet/weight history at this time. Will attempt to obtain at follow-up.  ? ?Weight history reviewed. No significant weight changes observed.  ? ?PO Intake: 100% x 1 recorded meal   ? ?UOP: 1857ml x24 hours ?I/O: -1142ml since admit ? ?Medications: vitamin C, vitamin D3, pepcid, mag-ox, protonix, miralax ? ?Labs: ?Recent Labs  ?Lab 05/20/21 ?1325 05/20/21 ?1340 05/20/21 ?2202 05/21/21 ?0306 05/23/21 ?0425  ?NA 135 135  --  136 134*  ?K 3.7 3.8  --  5.0 4.4  ?CL 100 99  --  100 100  ?CO2 29  --   --  30 28  ?BUN 21 22  --  17 10  ?CREATININE 0.80 0.80  --  0.86 0.68  ?CALCIUM 9.2  --   --  9.5 8.9  ?MG  --   --  2.0  --   --   ?GLUCOSE 138* 133*  --  132* 129*  ?CBGs: 103-193 x24 hours ?  ?NUTRITION - FOCUSED PHYSICAL EXAM: ?Unable to perform at this time. Will attempt at follow-up.  ? ?Diet Order:   ?Diet Order   ? ?       ?  Diet regular Room service appropriate? Yes; Fluid consistency: Thin  Diet effective now       ?  ? ?  ?  ? ?  ? ?EDUCATION NEEDS:  ?No education needs have been identified at this time ? ?Skin:  Skin Assessment: Skin Integrity Issues: ?Skin  Integrity Issues:: Incisions ?Incisions: R leg ? ?Last BM:  2/27 ? ?Height:  ?Ht Readings from Last 1 Encounters:  ?05/20/21 6\' 1"  (1.854 m)  ? ?Weight:  ?Wt Readings from Last 1 Encounters:  ?05/22/21 90.8 kg  ? ?BMI:  Body mass index is 26.41 kg/m?. ? ?Estimated Nutritional Needs:  ?Kcal:  2200-2400 ?Protein:  110-120 grams ?Fluid:  >2L ? ? ?Theone Stanley., MS, RD, LDN (she/her/hers) ?RD pager number and weekend/on-call pager number located in [place]. ?

## 2021-05-23 NOTE — Progress Notes (Signed)
Orthopedic Tech Progress Note ?Patient Details:  ?Darin [name] ?Feb 23, 1932 ?[id number] ? ?Patient ID: Darin [name], male   DOB: [birth year], 88 y.o.   MRN: [id number] ?Awaiting on bone foam order to arrive, as we are out. ? ?[place] ?05/23/2021, 9:48 AM ? ?

## 2021-05-23 NOTE — Progress Notes (Signed)
Orthopedic Tech Progress Note ?Patient Details:  ?Theda Belfast ?22-Dec-1931 ?[id number] ? ?Patient ID: Romar Woodrick, male   DOB: July 22, 1931, 88 y.o.   MRN: [id number] ?Placed order with [facility] for compression socks. ? ?[place] ?05/23/2021, 9:24 AM ? ?

## 2021-05-23 NOTE — Anesthesia Postprocedure Evaluation (Signed)
Anesthesia Post Note ? ?Patient: Darin [name] ? ?Procedure(s) Performed: INTRAMEDULLARY (IM) NAIL TIBIAL (Right) ? ?  ? ?Patient location during evaluation: PACU ?Anesthesia Type: General ?Level of consciousness: awake and alert ?Pain management: pain level controlled ?Vital Signs Assessment: post-procedure vital signs reviewed and stable ?Respiratory status: spontaneous breathing, nonlabored ventilation, respiratory function stable and patient connected to nasal cannula oxygen ?Cardiovascular status: blood pressure returned to baseline and stable ?Postop Assessment: no apparent nausea or vomiting ?Anesthetic complications: no ? ? ?No notable events documented. ? ?Last Vitals:  ?Vitals:  ? 05/23/21 0356 05/23/21 0734  ?BP: (!) 141/63 106/89  ?Pulse:  89  ?Resp: 20 20  ?Temp: 36.4 ?C 36.7 ?C  ?SpO2: 97% 91%  ?  ?Last Pain:  ?Vitals:  ? 05/23/21 0734  ?TempSrc: Oral  ?PainSc:   ? ? ?  ?  ?  ?  ?  ?  ? ?[name] ? ? ? ? ?

## 2021-05-23 NOTE — Progress Notes (Signed)
? ?                              Orthopaedic Trauma Service Progress Note ? ?Patient ID: ?Darin [name] ?MRN: [id number] ?DOB/AGE: [birth year] 86 y.o. ? ?Subjective: ? ?Doing ok  ?States he is not having any pain in his right leg  ?Has not been out of bed with therapy yet  ? ?ROS ?As above ? ?Objective:  ? ?VITALS:   ?Vitals:  ? 05/22/21 2010 05/22/21 2332 05/23/21 0356 05/23/21 0734  ?BP:  133/64 (!) 141/63 106/89  ?Pulse: 83   89  ?Resp: 20 20 20 20   ?Temp:  98.2 ?F (36.8 ?C) 97.6 ?F (36.4 ?C) 98 ?F (36.7 ?C)  ?TempSrc:  Oral Oral Oral  ?SpO2:  97% 97% 91%  ?Weight:      ?Height:      ? ? ?Estimated body mass index is 26.41 kg/m? as calculated from the following: ?  Height as of this encounter: 6\' 1"  (1.854 m). ?  Weight as of this encounter: 90.8 kg. ? ? ?Intake/Output   ?   02/28 0701 ?03/01 0700 03/01 0701 ?03/02 0700  ? P.O. 480   ? I.V. (mL/kg) 2404.9 (26.5)   ? IV Piggyback 300   ? Total Intake(mL/kg) 3184.9 (35.1)   ? Urine (mL/kg/hr) 2750 (1.3)   ? Stool 0   ? Blood 50   ? Total Output 2800   ? Net +384.9   ?     ? Stool Occurrence 1 x   ?  ? ?LABS ? ?Results for orders placed or performed during the hospital encounter of 05/20/21 (from the past 24 hour(s))  ?Lipid panel     Status: Abnormal  ? Collection Time: 05/22/21  3:46 PM  ?Result Value Ref Range  ? Cholesterol 211 (H) 0 - 200 mg/dL  ? Triglycerides 67 <150 mg/dL  ? HDL 73 >40 mg/dL  ? Total CHOL/HDL Ratio 2.9 RATIO  ? VLDL 13 0 - 40 mg/dL  ? LDL Cholesterol 125 (H) 0 - 99 mg/dL  ?Glucose, capillary     Status: Abnormal  ? Collection Time: 05/22/21  8:01 PM  ?Result Value Ref Range  ? Glucose-Capillary 170 (H) 70 - 99 mg/dL  ?CBC     Status: Abnormal  ? Collection Time: 05/23/21  4:25 AM  ?Result Value Ref Range  ? WBC 6.6 4.0 - 10.5 K/uL  ? RBC 3.37 (L) 4.22 - 5.81 MIL/uL  ? Hemoglobin 10.7 (L) 13.0 - 17.0 g/dL  ? HCT 31.2 (L) 39.0 - 52.0 %  ? MCV 92.6 80.0 - 100.0 fL  ? MCH 31.8 26.0 - 34.0 pg  ? MCHC 34.3  30.0 - 36.0 g/dL  ? RDW 13.2 11.5 - 15.5 %  ? Platelets 142 (L) 150 - 400 K/uL  ? nRBC 0.0 0.0 - 0.2 %  ?Basic metabolic panel     Status: Abnormal  ? Collection Time: 05/23/21  4:25 AM  ?Result Value Ref Range  ? Sodium 134 (L) 135 - 145 mmol/L  ? Potassium 4.4 3.5 - 5.1 mmol/L  ? Chloride 100 98 - 111 mmol/L  ? CO2 28 22 - 32 mmol/L  ? Glucose, Bld 129 (H) 70 - 99 mg/dL  ? BUN 10 8 - 23 mg/dL  ? Creatinine, Ser 0.68 0.61 - 1.24 mg/dL  ? Calcium 8.9 8.9 - 10.3 mg/dL  ? GFR, Estimated >60 >60 mL/min  ? Anion  gap 6 5 - 15  ?Glucose, capillary     Status: Abnormal  ? Collection Time: 05/23/21  6:13 AM  ?Result Value Ref Range  ? Glucose-Capillary 118 (H) 70 - 99 mg/dL  ?Glucose, capillary     Status: Abnormal  ? Collection Time: 05/23/21  8:02 AM  ?Result Value Ref Range  ? Glucose-Capillary 193 (H) 70 - 99 mg/dL  ? ? ? ?PHYSICAL EXAM:  ? ?Gen: resting comfortably in bed, NAD, appears well  ?Lungs: unlabored ?Ext:  ?     Right Lower extremity  ? Dressing c/d/I ? Ext warm  ? Ankle flexion, extension intact ? Ankle inversion and eversion intact ? No DCT ? Compartments are soft  ? No pain out of proportion with passive stretching  ? Good perfusion distally  ? ?Assessment/Plan: ?1 Day Post-Op  ? ? ? ?Anti-infectives (From admission, onward)  ? ? Start     Dose/Rate Route Frequency Ordered Stop  ? 05/22/21 1630  ceFAZolin (ANCEF) IVPB 2g/100 mL premix       ? 2 g ?200 mL/hr over 30 Minutes Intravenous Every 8 hours 05/22/21 1531 05/23/21 0633  ? 05/22/21 0800  ceFAZolin (ANCEF) IVPB 2g/100 mL premix       ? 2 g ?200 mL/hr over 30 Minutes Intravenous On call to O.R. 05/22/21 0554 05/22/21 1201  ? 05/20/21 1515  ceFAZolin (ANCEF) IVPB 1 g/50 mL premix       ? 1 g ?100 mL/hr over 30 Minutes Intravenous  Once 05/20/21 1506 05/20/21 1554  ? ?  ?. ? ?POD/HD#: 1 ? ?88 y/o male s/p ground level fall with R proximal tibia and fibula fracture ? ?-ground level fall ? ?- R proximal tibia fracture (extra-articular), R proximal fibula  fracture s/p IMN  ? NWB R leg with assistance ? Unrestricted ROM R knee and ankle ? Ice and elevate ? Do not put pillows under knee at rest, pt already has baseline knee contracture  ?  ? Therapy evals  ? ? Dressing change tomorrow or Friday  ? ?- Pain management: ? Multimodal  ? Maximize non-narcotics  ? ?- ABL anemia/Hemodynamics ? Stable ? Monitor  ? ?- Medical issues  ? Per primary  ? ?- DVT/PE prophylaxis: ? Lovenox x 21 days ? ?- ID:  ? Periop abx ? ?- Metabolic Bone Disease: ? Known osteoporosis  ? Vitamin d levels pending  ? I do not see any DEXAs in the system ? Recommend dexa in the next 4-8 weeks ? Refer to osteoporosis clinic  ? ?- Activity: ? Up with therapy  ? NWB R leg ?  ?- Impediments to fracture healing: ? Osteoporosis ? Low energy fracture  ? Increased fall risk  ? ?- Dispo: ? Therapy evals ? Likely needs snf  ? ? ?Jari Pigg, PA-C ?[phone] (C) ?05/23/2021, 8:29 AM ? ?[facility] ?[place] [address] ?[phone] Jenetta Downer) ?[phone] (F) ? ? ? ?After 5pm and on the weekends please log on to Amion, go to orthopaedics and the look under the [facility] Call for the provider(s) on call. You can also call our office at [phone] and then follow the prompts to be connected to the call team.  ? Patient ID: Darin [name], male   DOB: 10/30/1931, 88 y.o.   MRN: [id number] ? ?

## 2021-05-24 ENCOUNTER — Telehealth: Payer: Self-pay | Admitting: Family Medicine

## 2021-05-24 DIAGNOSIS — J449 Chronic obstructive pulmonary disease, unspecified: Secondary | ICD-10-CM | POA: Diagnosis not present

## 2021-05-24 DIAGNOSIS — Z7401 Bed confinement status: Secondary | ICD-10-CM | POA: Diagnosis not present

## 2021-05-24 DIAGNOSIS — F1090 Alcohol use, unspecified, uncomplicated: Secondary | ICD-10-CM | POA: Diagnosis not present

## 2021-05-24 DIAGNOSIS — R600 Localized edema: Secondary | ICD-10-CM | POA: Diagnosis not present

## 2021-05-24 DIAGNOSIS — K219 Gastro-esophageal reflux disease without esophagitis: Secondary | ICD-10-CM | POA: Diagnosis not present

## 2021-05-24 DIAGNOSIS — S82231D Displaced oblique fracture of shaft of right tibia, subsequent encounter for closed fracture with routine healing: Secondary | ICD-10-CM | POA: Diagnosis not present

## 2021-05-24 DIAGNOSIS — Z7689 Persons encountering health services in other specified circumstances: Secondary | ICD-10-CM | POA: Diagnosis not present

## 2021-05-24 DIAGNOSIS — S51012D Laceration without foreign body of left elbow, subsequent encounter: Secondary | ICD-10-CM | POA: Diagnosis not present

## 2021-05-24 DIAGNOSIS — L299 Pruritus, unspecified: Secondary | ICD-10-CM | POA: Diagnosis not present

## 2021-05-24 DIAGNOSIS — E785 Hyperlipidemia, unspecified: Secondary | ICD-10-CM | POA: Diagnosis not present

## 2021-05-24 DIAGNOSIS — L853 Xerosis cutis: Secondary | ICD-10-CM | POA: Diagnosis not present

## 2021-05-24 DIAGNOSIS — S61512A Laceration without foreign body of left wrist, initial encounter: Secondary | ICD-10-CM | POA: Diagnosis not present

## 2021-05-24 DIAGNOSIS — F32A Depression, unspecified: Secondary | ICD-10-CM | POA: Diagnosis not present

## 2021-05-24 DIAGNOSIS — M79661 Pain in right lower leg: Secondary | ICD-10-CM | POA: Diagnosis not present

## 2021-05-24 DIAGNOSIS — S82201A Unspecified fracture of shaft of right tibia, initial encounter for closed fracture: Secondary | ICD-10-CM | POA: Diagnosis not present

## 2021-05-24 DIAGNOSIS — M1990 Unspecified osteoarthritis, unspecified site: Secondary | ICD-10-CM | POA: Diagnosis not present

## 2021-05-24 DIAGNOSIS — E871 Hypo-osmolality and hyponatremia: Secondary | ICD-10-CM | POA: Diagnosis not present

## 2021-05-24 DIAGNOSIS — S51012A Laceration without foreign body of left elbow, initial encounter: Secondary | ICD-10-CM | POA: Diagnosis not present

## 2021-05-24 DIAGNOSIS — M79662 Pain in left lower leg: Secondary | ICD-10-CM | POA: Diagnosis not present

## 2021-05-24 DIAGNOSIS — R531 Weakness: Secondary | ICD-10-CM | POA: Diagnosis not present

## 2021-05-24 DIAGNOSIS — S61512D Laceration without foreign body of left wrist, subsequent encounter: Secondary | ICD-10-CM | POA: Diagnosis not present

## 2021-05-24 DIAGNOSIS — N400 Enlarged prostate without lower urinary tract symptoms: Secondary | ICD-10-CM | POA: Diagnosis not present

## 2021-05-24 DIAGNOSIS — M1711 Unilateral primary osteoarthritis, right knee: Secondary | ICD-10-CM | POA: Diagnosis not present

## 2021-05-24 DIAGNOSIS — K5900 Constipation, unspecified: Secondary | ICD-10-CM | POA: Diagnosis not present

## 2021-05-24 DIAGNOSIS — D638 Anemia in other chronic diseases classified elsewhere: Secondary | ICD-10-CM | POA: Diagnosis not present

## 2021-05-24 DIAGNOSIS — S51002A Unspecified open wound of left elbow, initial encounter: Secondary | ICD-10-CM | POA: Diagnosis not present

## 2021-05-24 DIAGNOSIS — L84 Corns and callosities: Secondary | ICD-10-CM | POA: Diagnosis not present

## 2021-05-24 DIAGNOSIS — M79604 Pain in right leg: Secondary | ICD-10-CM | POA: Diagnosis not present

## 2021-05-24 DIAGNOSIS — Z9181 History of falling: Secondary | ICD-10-CM | POA: Diagnosis not present

## 2021-05-24 DIAGNOSIS — S51802A Unspecified open wound of left forearm, initial encounter: Secondary | ICD-10-CM | POA: Diagnosis not present

## 2021-05-24 DIAGNOSIS — S82401A Unspecified fracture of shaft of right fibula, initial encounter for closed fracture: Secondary | ICD-10-CM | POA: Diagnosis not present

## 2021-05-24 DIAGNOSIS — Z20822 Contact with and (suspected) exposure to covid-19: Secondary | ICD-10-CM | POA: Diagnosis not present

## 2021-05-24 DIAGNOSIS — L209 Atopic dermatitis, unspecified: Secondary | ICD-10-CM | POA: Diagnosis not present

## 2021-05-24 DIAGNOSIS — R2241 Localized swelling, mass and lump, right lower limb: Secondary | ICD-10-CM | POA: Diagnosis not present

## 2021-05-24 DIAGNOSIS — Z298 Encounter for other specified prophylactic measures: Secondary | ICD-10-CM | POA: Diagnosis not present

## 2021-05-24 DIAGNOSIS — I82411 Acute embolism and thrombosis of right femoral vein: Secondary | ICD-10-CM | POA: Diagnosis not present

## 2021-05-24 DIAGNOSIS — R21 Rash and other nonspecific skin eruption: Secondary | ICD-10-CM | POA: Diagnosis not present

## 2021-05-24 DIAGNOSIS — S82201D Unspecified fracture of shaft of right tibia, subsequent encounter for closed fracture with routine healing: Secondary | ICD-10-CM | POA: Diagnosis not present

## 2021-05-24 DIAGNOSIS — R2689 Other abnormalities of gait and mobility: Secondary | ICD-10-CM | POA: Diagnosis not present

## 2021-05-24 DIAGNOSIS — K5731 Diverticulosis of large intestine without perforation or abscess with bleeding: Secondary | ICD-10-CM | POA: Diagnosis not present

## 2021-05-24 DIAGNOSIS — R070 Pain in throat: Secondary | ICD-10-CM | POA: Diagnosis not present

## 2021-05-24 DIAGNOSIS — M6281 Muscle weakness (generalized): Secondary | ICD-10-CM | POA: Diagnosis not present

## 2021-05-24 DIAGNOSIS — I10 Essential (primary) hypertension: Secondary | ICD-10-CM | POA: Diagnosis not present

## 2021-05-24 LAB — CBC WITH DIFFERENTIAL/PLATELET
Abs Immature Granulocytes: 0.04 10*3/uL (ref 0.00–0.07)
Basophils Absolute: 0 10*3/uL (ref 0.0–0.1)
Basophils Relative: 0 %
Eosinophils Absolute: 0.2 10*3/uL (ref 0.0–0.5)
Eosinophils Relative: 3 %
HCT: 28 % — ABNORMAL LOW (ref 39.0–52.0)
Hemoglobin: 9.5 g/dL — ABNORMAL LOW (ref 13.0–17.0)
Immature Granulocytes: 1 %
Lymphocytes Relative: 21 %
Lymphs Abs: 1.3 10*3/uL (ref 0.7–4.0)
MCH: 31.8 pg (ref 26.0–34.0)
MCHC: 33.9 g/dL (ref 30.0–36.0)
MCV: 93.6 fL (ref 80.0–100.0)
Monocytes Absolute: 0.9 10*3/uL (ref 0.1–1.0)
Monocytes Relative: 14 %
Neutro Abs: 4 10*3/uL (ref 1.7–7.7)
Neutrophils Relative %: 61 %
Platelets: 156 10*3/uL (ref 150–400)
RBC: 2.99 MIL/uL — ABNORMAL LOW (ref 4.22–5.81)
RDW: 13.2 % (ref 11.5–15.5)
WBC: 6.5 10*3/uL (ref 4.0–10.5)
nRBC: 0 % (ref 0.0–0.2)

## 2021-05-24 LAB — BASIC METABOLIC PANEL
Anion gap: 8 (ref 5–15)
BUN: 18 mg/dL (ref 8–23)
CO2: 26 mmol/L (ref 22–32)
Calcium: 8.6 mg/dL — ABNORMAL LOW (ref 8.9–10.3)
Chloride: 103 mmol/L (ref 98–111)
Creatinine, Ser: 0.79 mg/dL (ref 0.61–1.24)
GFR, Estimated: >60 mL/min (ref >60)
Glucose, Bld: 118 mg/dL — ABNORMAL HIGH (ref 70–99)
Potassium: 3.6 mmol/L (ref 3.5–5.1)
Sodium: 137 mmol/L (ref 135–145)

## 2021-05-24 LAB — GLUCOSE, CAPILLARY: Glucose-Capillary: 105 mg/dL — ABNORMAL HIGH (ref 70–99)

## 2021-05-24 LAB — VITAMIN D 25 HYDROXY (VIT D DEFICIENCY, FRACTURES)

## 2021-05-24 MED ORDER — ENOXAPARIN SODIUM 40 MG/0.4ML IJ SOSY: 40.0000 mg | PREFILLED_SYRINGE | Freq: Every day | INTRAMUSCULAR | Status: DC

## 2021-05-24 MED ORDER — ENSURE ENLIVE PO LIQD: 237.0000 mL | Freq: Two times a day (BID) | ORAL | 12 refills | Status: DC

## 2021-05-24 MED ORDER — OXYCODONE HCL 5 MG PO TABS: 5.0000 mg | ORAL_TABLET | ORAL | 0 refills | Status: AC | PRN

## 2021-05-24 NOTE — Telephone Encounter (Signed)
Pt call and stated he want dr,Burchette to know that he was in the hospital and they want him to go back on baby aspirin and want to know what dr.Burchette think,pt stated dr. Elease Hashimoto can call him at [phone]. ?

## 2021-05-24 NOTE — Discharge Summary (Signed)
Physician Discharge Summary  Darin [name] TKW:[id number] DOB: [birth year] DOA: 05/20/2021  PCP: [name], MD  Admit date: 05/20/2021 Discharge date: 05/24/2021  Admitted From: Home Discharge disposition: SNF  Recommendations at discharge:  Your blood pressure medication have been adjusted.  Only continue amlodipine.  If systolic blood pressure exceeds 140, consider restarting lisinopril and avoid diuretics because of risk of orthostatic hypotension. Pain medicine and DVT prophylaxis per orthopedic recommendation Follow-up with orthopedics as an outpatient   Brief narrative: Darin [name] is a 88 y.o. male with PMH significant for HTN, HLD, polymyalgia rheumatica, peripheral neuropathy, arthritis, Dupuytren's contracture on the right hand, GERD, hiatal hernia, osteoporosis who lives at home alone. Patient was brought to the [facility] ED on 2/26 from home after an episode of syncope.  Patient reports while getting up from the toilet, he passed out and fell on the floor.  He started having right leg/ankle pain and also had a small laceration/tear on the left wrist. Patient also reports having similar syncopal episode 2 months ago without any subsequent injury.  He did not seek medical attention at the time.  In the ED patient was afebrile, heart rate 100, blood pressure 154/74. Right knee/tibia/fibula x-ray showed displaced fractures of the proximal tibial diaphysis and proximal fibular metaphysis. Labs showed WBC count elevated to 15.2, hemoglobin 13.2, glucose level 138. Orthopedic surgery was consulted Patient was transferred to [facility] Admitted to hospitalist service  Subjective: Patient was seen and examined this morning. Lying down in bed.  Not in distress.  Pain controlled.    Principal Problem:   Tibia/fibula fracture, right, closed, initial encounter Active Problems:   Hyperlipidemia   Essential hypertension   GERD (gastroesophageal reflux disease)    Syncope   COPD (chronic obstructive pulmonary disease) (HCC)   BPH (benign prostatic hyperplasia)   Hospital course: Tibia/fibula fracture, right, closed, initial encounter -Orthopedic consulted.   -Underwent IM nailing on 2/28. -Per orthopedic recommendation, nonweightbearing in the right leg with assistance.  Unrestricted range of movement on right knee and ankle.  Apply ice and elevate.  Do not put pillows under the knee at rest because of the risk of progression of knee contracture.  Multimodal pain management.  Minimize use of narcotics. -DVT prophylaxis -per orthopedics, Lovenox subcu for 21 days  Vasovagal syncope History of hypertension -Patient reportedly passed out while getting up from the bathroom.  Probably had orthostatic hypotension. Patient reports syncope 2 months ago while [place]  Patient reports EMS noted normal blood pressure at the time and he did not seek medical attention. -His home meds include benazepril 10 mg daily, HCTZ 12.5 mg daily, amlodipine 5 mg daily. -HCTZ and benazepril were held.  He was adequately hydrated.  Blood pressure reasonably controlled on amlodipine only.  I will continue amlodipine only post discharge. -Echocardiogram unremarkable with EF 70 to 75%, no wall motion abnormality, normal pulm artery pressure, no valvular abnormality..  Bilateral carotid artery stenosis Hyperlipidemia -2/27, ultrasound duplex of carotid arteries showed 40-59% stenosis of both carotid arteries. -Lipid panel with HDL 73, LDL 125.  With bilateral carotid artery stenosis, patient would benefit from antiplatelet and statin. -Patient states he is allergic to statin.  He stated that he was previously on prophylactic aspirin but it was recently discontinued by his PCP.  He wants to discuss with PCP before going back to using them again.  GERD (gastroesophageal reflux disease) -Continue Protonix and Pepcid.  COPD (chronic obstructive  pulmonary disease) -Continue bronchodilators   BPH (benign prostatic hyperplasia) -Continue finasteride 5 mg p.o. daily.  Goals of care   Code Status: Full Code    Mobility: Needs PT eval postprocedure  Nutritional status:  Body mass index is 26.41 kg/m.  Nutrition Problem: Increased nutrient needs Etiology: post-op healing Signs/Symptoms: estimated needs        Wounds:  - Incision 10/14/11 Groin Right (Active)  Date First Assessed/Time First Assessed: 10/14/11 1241   Location: Groin  Location Orientation: Right    Assessments 10/14/2011 12:54 PM 10/14/2011  3:17 PM  Dressing Clean;Dry;Intact Clean;Dry;Intact  Drainage Amount -- None     No Linked orders to display     Incision (Closed) 05/22/21 Leg Right (Active)  Date First Assessed/Time First Assessed: 05/22/21 1406   Location: Leg  Location Orientation: Right    Assessments 05/22/2021  2:30 PM 05/22/2021  8:30 PM  Dressing Type Compression wrap Compression wrap  Dressing Clean, Dry, Intact Clean, Dry, Intact  Site / Wound Assessment Dressing in place / Unable to assess --  Drainage Amount None None     No Linked orders to display    Discharge Exam:   Vitals:   05/23/21 0734 05/23/21 2006 05/24/21 0422 05/24/21 0724  BP: 106/89 (!) 121/54 (!) 126/56 (!) 133/59  Pulse: 89  79 82  Resp: 20 18 19    Temp: 98 F (36.7 C) 98.4 F (36.9 C) 98.2 F (36.8 C) 98.4 F (36.9 C)  TempSrc: Oral Oral Oral Oral  SpO2: 91% 93% 92%   Weight:      Height:        Body mass index is 26.41 kg/m.  General exam: Pleasant, elderly Caucasian male.  Not in distress Skin: No rashes, lesions or ulcers. HEENT: Atraumatic, normocephalic, no obvious bleeding Lungs: Clear to auscultation bilaterally CVS: Regular rate and rhythm, no murmur GI/Abd soft, nontender, nondistended, bowel sound present CNS: Alert, awake, oriented x3 Psychiatry: Mood appropriate Extremities: No pedal edema, no calf tenderness  Follow ups:     Follow-up Information     Burchette, Alinda Sierras, MD Follow up.   Specialty: Family Medicine Contact information: [place] [address] [phone]                 Discharge Instructions:   Discharge Instructions     Call MD for:  difficulty breathing, headache or visual disturbances   Complete by: As directed    Call MD for:  extreme fatigue   Complete by: As directed    Call MD for:  hives   Complete by: As directed    Call MD for:  persistant dizziness or light-headedness   Complete by: As directed    Call MD for:  persistant nausea and vomiting   Complete by: As directed    Call MD for:  severe uncontrolled pain   Complete by: As directed    Call MD for:  temperature >100.4   Complete by: As directed    Diet - low sodium heart healthy   Complete by: As directed    Discharge instructions   Complete by: As directed    Recommendations at discharge:   Your blood pressure medication have been adjusted.  Only continue amlodipine.  If systolic blood pressure exceeds 140, consider restarting lisinopril and avoid diuretics because of risk of orthostatic hypotension.  Pain medicine and DVT prophylaxis per orthopedic recommendation  Follow-up with orthopedics as an outpatient  General discharge instructions: Follow with Primary  MD [name], MD in 7 days  Please request your PCP  to go over your hospital tests, procedures, radiology results at the follow up. Please get your medicines reviewed and adjusted.  Your PCP may decide to repeat certain labs or tests as needed. Do not drive, operate heavy machinery, perform activities at heights, swimming or participation in water activities or provide baby sitting services if your were admitted for syncope or siezures until you have seen by Primary MD or a Neurologist and advised to do so again. [place] Controlled Substance Reporting System database was reviewed. Do not drive, operate heavy  machinery, perform activities at heights, swim, participate in water activities or provide baby-sitting services while on medications for pain, sleep and mood until your outpatient physician has reevaluated you and advised to do so again.  You are strongly recommended to comply with the dose, frequency and duration of prescribed medications. Activity: As tolerated with Full fall precautions use walker/cane & assistance as needed Avoid using any recreational substances like cigarette, tobacco, alcohol, or non-prescribed drug. If you experience worsening of your admission symptoms, develop shortness of breath, life threatening emergency, suicidal or homicidal thoughts you must seek medical attention immediately by calling 911 or calling your MD immediately  if symptoms less severe. You must read complete instructions/literature along with all the possible adverse reactions/side effects for all the medicines you take and that have been prescribed to you. Take any new medicine only after you have completely understood and accepted all the possible adverse reactions/side effects.  Wear Seat belts while driving. You were cared for by a hospitalist during your hospital stay. If you have any questions about your discharge medications or the care you received while you were in the hospital after you are discharged, you can call the unit and ask to speak with the hospitalist or the covering physician. Once you are discharged, your primary care physician will handle any further medical issues. Please note that NO REFILLS for any discharge medications will be authorized once you are discharged, as it is imperative that you return to your primary care physician (or establish a relationship with a primary care physician if you do not have one).   Discharge wound care:   Complete by: As directed    Increase activity slowly   Complete by: As directed        Discharge Medications:   Allergies as of 05/24/2021        Reactions   Statins Other (See Comments)   Muscle pain and upset stomach        Medication List     STOP taking these medications    benazepril-hydrochlorthiazide 10-12.5 MG tablet Commonly known as: LOTENSIN HCT       TAKE these medications    amLODipine 5 MG tablet Commonly known as: NORVASC TAKE 1 TABLET DAILY What changed:  how much to take when to take this   enoxaparin 40 MG/0.4ML injection Commonly known as: LOVENOX Inject 0.4 mLs (40 mg total) into the skin daily for 21 days. Start taking on: May 25, 2021   famotidine 20 MG tablet Commonly known as: PEPCID TAKE 1 TABLET AT BEDTIME   feeding supplement Liqd Take 237 mLs by mouth 2 (two) times daily between meals.   finasteride 5 MG tablet Commonly known as: PROSCAR TAKE 1 TABLET EACH DAY. What changed:  how much to take how to take this when to take this additional instructions   gabapentin 100 MG capsule  Commonly known as: NEURONTIN TAKE 1 CAPSULE 4 TIMES     DAILY What changed: See the new instructions.   magnesium oxide 400 MG tablet Commonly known as: MAG-OX Take 400 mg by mouth daily.   oxyCODONE 5 MG immediate release tablet Commonly known as: Oxy IR/ROXICODONE Take 1 tablet (5 mg total) by mouth every 4 (four) hours as needed for up to 5 days for moderate pain.   polyethylene glycol 17 g packet Commonly known as: MIRALAX / GLYCOLAX Take 17 g by mouth daily as needed for moderate constipation. What changed: when to take this   triamcinolone ointment 0.1 % Commonly known as: KENALOG Apply 1 application topically 2 (two) times daily. To affected area What changed:  when to take this reasons to take this additional instructions   vitamin C 500 MG tablet Commonly known as: ASCORBIC ACID Take 500 mg by mouth daily.   VITAMIN D PO Take 2,500 Units by mouth at bedtime.               Discharge Care Instructions  (From admission, onward)           Start     Ordered    05/24/21 0000  Discharge wound care:        05/24/21 1124             The results of significant diagnostics from this hospitalization (including imaging, microbiology, ancillary and laboratory) are listed below for reference.    Procedures and Diagnostic Studies:   DG Tibia/Fibula Right  Result Date: 05/20/2021 CLINICAL DATA:  Fall.  Right leg pain.  Initial encounter. EXAM: RIGHT TIBIA AND FIBULA - 2 VIEW COMPARISON:  None. FINDINGS: Fractures of the proximal tibial diaphysis and proximal fibular metaphysis are seen, both showing mild-to-moderate lateral displacement of the distal fracture fragments. Severe tricompartmental osteoarthritis of the right knee is noted. IMPRESSION: Displaced fractures of the proximal tibial diaphysis and proximal fibular metaphysis. Electronically Signed   By: Marlaine Hind M.D.   On: 05/20/2021 15:07   DG Chest Port 1 View  Result Date: 05/20/2021 CLINICAL DATA:  Fall in bathroom. Right leg fracture. Pre-op clearance exam EXAM: PORTABLE CHEST 1 VIEW COMPARISON:  04/21/2012 FINDINGS: The heart size and mediastinal contours are within normal limits. Aortic atherosclerotic calcification noted. Both lungs are clear. The visualized skeletal structures are unremarkable. IMPRESSION: No active disease. Electronically Signed   By: Marlaine Hind M.D.   On: 05/20/2021 15:07   DG Knee Complete 4 Views Right  Result Date: 05/20/2021 CLINICAL DATA:  Fall at home.  Right knee pain. EXAM: RIGHT KNEE - COMPLETE 4+ VIEW COMPARISON:  None. FINDINGS: Acute fractures of the proximal tibial diaphysis and proximal fibular metaphysis are seen with mild lateral and anterior displacement of the distal fracture fragments. Severe tricompartmental osteoarthritis is seen. Small knee joint effusion noted. Generalized osteopenia demonstrated. Peripheral vascular calcification also seen. IMPRESSION: Acute fractures of the proximal tibial diaphysis and proximal fibular metaphysis. Severe  tricompartmental osteoarthritis, and small knee joint effusion. Electronically Signed   By: Marlaine Hind M.D.   On: 05/20/2021 15:05   ECHOCARDIOGRAM COMPLETE  Result Date: 05/21/2021    ECHOCARDIOGRAM REPORT   Patient Name:   ALEKAI POCOCK Date of Exam: 05/21/2021 Medical Rec #:  [id number]      Height:       73.0 in Accession #:    [id number]     Weight:       196.0 lb Date of Birth:  Jul 26, 1931  BSA:          2.133 m Patient Age:    37 years       BP:           137/62 mmHg Patient Gender: M              HR:           99 bpm. Exam Location:  Inpatient Procedure: 2D Echo, Cardiac Doppler and Color Doppler Indications:    R55 SYNCOPE  History:        Patient has prior history of Echocardiogram examinations, most                 recent 03/15/2017. Risk Factors:Hypertension and Dyslipidemia.  Sonographer:    Beryle Beams Referring Phys: [id number] DAVID MANUEL ORTIZ  Sonographer Comments: Suboptimal parasternal window and suboptimal apical window. PATIENT UNABLE TO ROLL ON SIDE DUE BROKEN LEG. IMPRESSIONS  1. Left ventricular ejection fraction, by estimation, is 70 to 75%. The left ventricle has hyperdynamic function. The left ventricle has no regional wall motion abnormalities. Indeterminate diastolic filling due to E-A fusion.  2. Right ventricular systolic function is normal. The right ventricular size is normal. There is normal pulmonary artery systolic pressure. The estimated right ventricular systolic pressure is 20.9 mmHg.  3. The mitral valve is abnormal. Trivial mitral valve regurgitation. Moderate mitral annular calcification.  4. The aortic valve is tricuspid. There is mild calcification of the aortic valve. There is mild thickening of the aortic valve. Aortic valve regurgitation is mild. Aortic valve sclerosis/calcification is present, without any evidence of aortic stenosis.  5. The inferior vena cava is normal in size with greater than 50% respiratory variability, suggesting right atrial pressure of  3 mmHg. Comparison(s): Compared to prior TTE in 2018, there is no significant change. FINDINGS  Left Ventricle: Left ventricular ejection fraction, by estimation, is 70 to 75%. The left ventricle has hyperdynamic function. The left ventricle has no regional wall motion abnormalities. The left ventricular internal cavity size was normal in size. There is no left ventricular hypertrophy. Indeterminate diastolic filling due to E-A fusion. Right Ventricle: The right ventricular size is normal. Right vetricular wall thickness was not well visualized. Right ventricular systolic function is normal. There is normal pulmonary artery systolic pressure. The tricuspid regurgitant velocity is 2.68 m/s, and with an assumed right atrial pressure of 3 mmHg, the estimated right ventricular systolic pressure is 47.0 mmHg. Left Atrium: Left atrial size was not well visualized. Right Atrium: Right atrial size was not well visualized. Pericardium: There is no evidence of pericardial effusion. Mitral Valve: The mitral valve is abnormal. There is mild thickening of the mitral valve leaflet(s). There is mild calcification of the mitral valve leaflet(s). Moderate mitral annular calcification. Trivial mitral valve regurgitation. Tricuspid Valve: The tricuspid valve is normal in structure. Tricuspid valve regurgitation is trivial. Aortic Valve: The aortic valve is tricuspid. There is mild calcification of the aortic valve. There is mild thickening of the aortic valve. Aortic valve regurgitation is mild. Aortic valve sclerosis/calcification is present, without any evidence of aortic stenosis. Aortic valve mean gradient measures 10.0 mmHg. Aortic valve peak gradient measures 18.1 mmHg. Pulmonic Valve: The pulmonic valve was not well visualized. Pulmonic valve regurgitation is trivial. Aorta: The aortic root is normal in size and structure. Venous: The inferior vena cava is normal in size with greater than 50% respiratory variability, suggesting  right atrial pressure of 3 mmHg. IAS/Shunts: The atrial septum is grossly normal.  LEFT  VENTRICLE PLAX 2D LVIDd:         4.30 cm     Diastology LVIDs:         2.90 cm     LV e' medial:    6.84 cm/s LV PW:         1.10 cm     LV E/e' medial:  14.6 LV IVS:        1.00 cm     LV e' lateral:   9.79 cm/s                            LV E/e' lateral: 10.2  LV Volumes (MOD) LV vol d, MOD A2C: 55.5 ml LV vol d, MOD A4C: 57.4 ml LV vol s, MOD A2C: 14.8 ml LV vol s, MOD A4C: 16.4 ml LV SV MOD A2C:     40.7 ml LV SV MOD A4C:     57.4 ml LV SV MOD BP:      40.5 ml RIGHT VENTRICLE             IVC RV S prime:     18.30 cm/s  IVC diam: 2.00 cm TAPSE (M-mode): 2.2 cm LEFT ATRIUM             Index LA diam:        3.00 cm 1.41 cm/m LA Vol (A2C):   26.4 ml 12.38 ml/m LA Vol (A4C):   20.3 ml 9.52 ml/m LA Biplane Vol: 23.3 ml 10.92 ml/m  AORTIC VALVE                    PULMONIC VALVE AV Vmax:           213.00 cm/s  PV Vmax:       0.86 m/s AV Vmean:          145.000 cm/s PV Vmean:      49.200 cm/s AV VTI:            0.334 m      PV VTI:        0.130 m AV Peak Grad:      18.1 mmHg    PV Peak grad:  3.0 mmHg AV Mean Grad:      10.0 mmHg    PV Mean grad:  1.0 mmHg LVOT Vmax:         129.00 cm/s LVOT Vmean:        85.700 cm/s LVOT VTI:          0.207 m LVOT/AV VTI ratio: 0.62  AORTA Ao Root diam: 2.35 cm Ao Asc diam:  3.20 cm MITRAL VALVE                TRICUSPID VALVE MV Area (PHT): 3.54 cm     TR Peak grad:   28.7 mmHg MV Decel Time: 214 msec     TR Mean grad:   21.0 mmHg MV E velocity: 100.00 cm/s  TR Vmax:        268.00 cm/s MV A velocity: 71.30 cm/s   TR Vmean:       218.0 cm/s MV E/A ratio:  1.40                             SHUNTS  Systemic VTI: 0.21 m Gwyndolyn Kaufman MD Electronically signed by Gwyndolyn Kaufman MD Signature Date/Time: 05/21/2021/2:08:28 PM    Final    VAS US CAROTID  Result Date: 05/22/2021 Carotid Arterial Duplex Study Patient Name:  KHALI ALBANESE  Date of Exam:   05/21/2021  Medical Rec #: [id number]       Accession #:    [id number] Date of Birth: 1931/04/30        Patient Gender: M Patient Age:   15 years Exam Location:  [facility] Procedure:      VAS US CAROTID Referring Phys: DAVID ORTIZ --------------------------------------------------------------------------------  Indications:       Syncope. Risk Factors:      Hypertension, hyperlipidemia. Comparison Study:  03-30-2012 Carotid duplex study available. Performing Technologist: Darlin Coco RDMS, RVT  Examination Guidelines: A complete evaluation includes B-mode imaging, spectral Doppler, color Doppler, and power Doppler as needed of all accessible portions of each vessel. Bilateral testing is considered an integral part of a complete examination. Limited examinations for reoccurring indications may be performed as noted.  Right Carotid Findings: +----------+-------+--------+--------+-----------------------+-----------------+             PSV     EDV cm/s Stenosis Plaque Description      Comments                       cm/s                                                                 +----------+-------+--------+--------+-----------------------+-----------------+  CCA Prox   70      8                                         intimal                                                                          thickening         +----------+-------+--------+--------+-----------------------+-----------------+  CCA Distal 77      9                                         intimal                                                                          thickening         +----------+-------+--------+--------+-----------------------+-----------------+  ICA Prox   159     18       40-59%   calcific and  heterogenous                               +----------+-------+--------+--------+-----------------------+-----------------+  ICA Mid    113     17                                                            +----------+-------+--------+--------+-----------------------+-----------------+  ICA Distal 101     14                                                           +----------+-------+--------+--------+-----------------------+-----------------+  ECA        115                       calcific and                                                                     heterogenous                               +----------+-------+--------+--------+-----------------------+-----------------+ +----------+--------+-------+----------------+-------------------+             PSV cm/s EDV cms Describe         Arm Pressure (mmHG)  +----------+--------+-------+----------------+-------------------+  Subclavian 107              Multiphasic, WNL                      +----------+--------+-------+----------------+-------------------+ +---------+--------+--+--------+-+---------+  Vertebral PSV cm/s 60 EDV cm/s 7 Antegrade  +---------+--------+--+--------+-+---------+  Left Carotid Findings: +----------+-------+--------+--------+-----------------------+-----------------+             PSV     EDV cm/s Stenosis Plaque Description      Comments                       cm/s                                                                 +----------+-------+--------+--------+-----------------------+-----------------+  CCA Prox   114     9                                                            +----------+-------+--------+--------+-----------------------+-----------------+  CCA Distal 92      9  intimal                                                                          thickening         +----------+-------+--------+--------+-----------------------+-----------------+  ICA Prox   155     17       40-59%   heterogenous and                                                                 calcific                                    +----------+-------+--------+--------+-----------------------+-----------------+  ICA Mid    116     17                                                           +----------+-------+--------+--------+-----------------------+-----------------+  ICA Distal 101     22                                                           +----------+-------+--------+--------+-----------------------+-----------------+  ECA        129                       calcific                                   +----------+-------+--------+--------+-----------------------+-----------------+ +----------+--------+--------+----------------+-------------------+             PSV cm/s EDV cm/s Describe         Arm Pressure (mmHG)  +----------+--------+--------+----------------+-------------------+  Subclavian 157               Multiphasic, WNL                      +----------+--------+--------+----------------+-------------------+ +---------+--------+--+--------+--+---------+  Vertebral PSV cm/s 83 EDV cm/s 12 Antegrade  +---------+--------+--+--------+--+---------+   Summary: Right Carotid: Velocities in the right ICA are consistent with a 40-59% stenosis                by ICA/CCA ratio. Left Carotid: Velocities in the left ICA are consistent with a 40-59% stenosis               by ICA/CCA ratio. Vertebrals:  Bilateral vertebral arteries demonstrate antegrade flow. Subclavians: Normal flow hemodynamics were seen in bilateral subclavian              arteries. *See table(s) above for measurements and observations.  Electronically signed by Antony Contras MD on 05/22/2021 at 9:04:49 AM.    Final      Labs:   Basic Metabolic Panel: Recent Labs  Lab 05/20/21 1325 05/20/21 1340 05/20/21 2202 05/21/21 0306 05/23/21 0425 05/24/21 0258  NA 135 135  --  136 134* 137  K 3.7 3.8  --  5.0 4.4 3.6  CL 100 99  --  100 100 103  CO2 29  --   --  30 28 26   GLUCOSE 138* 133*  --  132* 129* 118*  BUN 21 22  --  17 10 18   CREATININE 0.80 0.80  --  0.86  0.68 0.79  CALCIUM 9.2  --   --  9.5 8.9 8.6*  MG  --   --  2.0  --   --   --    GFR Estimated Creatinine Clearance: 73.5 mL/min (by C-G formula based on SCr of 0.79 mg/dL). Liver Function Tests: Recent Labs  Lab 05/20/21 2202 05/21/21 0306  AST 30 25  ALT 22 22  ALKPHOS 45 41  BILITOT 0.5 0.7  PROT 5.7* 5.9*  ALBUMIN 2.7* 2.9*   No results for input(s): LIPASE, AMYLASE in the last 168 hours. No results for input(s): AMMONIA in the last 168 hours. Coagulation profile No results for input(s): INR, PROTIME in the last 168 hours.  CBC: Recent Labs  Lab 05/20/21 1325 05/20/21 1340 05/21/21 0306 05/23/21 0425 05/24/21 0258  WBC 15.2*  --  9.1 6.6 6.5  NEUTROABS  --   --   --   --  4.0  HGB 13.2 13.3 12.3* 10.7* 9.5*  HCT 39.1 39.0 36.3* 31.2* 28.0*  MCV 93.8  --  93.3 92.6 93.6  PLT 184  --  179 142* 156   Cardiac Enzymes: No results for input(s): CKTOTAL, CKMB, CKMBINDEX, TROPONINI in the last 168 hours. BNP: Invalid input(s): POCBNP CBG: Recent Labs  Lab 05/22/21 0537 05/22/21 2001 05/23/21 0613 05/23/21 0802 05/24/21 0629  GLUCAP 103* 170* 118* 193* 105*   D-Dimer No results for input(s): DDIMER in the last 72 hours. Hgb A1c Recent Labs    05/23/21 0425  HGBA1C 5.7*   Lipid Profile Recent Labs    05/22/21 1546  CHOL 211*  HDL 73  LDLCALC 125*  TRIG 67  CHOLHDL 2.9   Thyroid function studies No results for input(s): TSH, T4TOTAL, T3FREE, THYROIDAB in the last 72 hours.  Invalid input(s): FREET3 Anemia work up No results for input(s): VITAMINB12, FOLATE, FERRITIN, TIBC, IRON, RETICCTPCT in the last 72 hours. Microbiology Recent Results (from the past 240 hour(s))  Resp Panel by RT-PCR (Flu A&B, Covid) Nasopharyngeal Swab     Status: None   Collection Time: 05/20/21  3:07 PM   Specimen: Nasopharyngeal Swab; Nasopharyngeal(NP) swabs in vial transport medium  Result Value Ref Range Status   SARS Coronavirus 2 by RT PCR NEGATIVE NEGATIVE Final     Comment: (NOTE) SARS-CoV-2 target nucleic acids are NOT DETECTED.  The SARS-CoV-2 RNA is generally detectable in upper respiratory specimens during the acute phase of infection. The lowest concentration of SARS-CoV-2 viral copies this assay can detect is 138 copies/mL. A negative result does not preclude SARS-Cov-2 infection and should not be used as the sole basis for treatment or other patient management decisions. A negative result may occur with  improper specimen collection/handling, submission of specimen other than nasopharyngeal swab, presence of viral mutation(s) within the areas targeted by this assay, and inadequate number of viral  copies(<138 copies/mL). A negative result must be combined with clinical observations, patient history, and epidemiological information. The expected result is Negative.  Fact Sheet for Patients:  EntrepreneurPulse.com.au  Fact Sheet for Healthcare Providers:  IncredibleEmployment.be  This test is no t yet approved or cleared by the [place] FDA and  has been authorized for detection and/or diagnosis of SARS-CoV-2 by FDA under an Emergency Use Authorization (EUA). This EUA will remain  in effect (meaning this test can be used) for the duration of the COVID-19 declaration under Section 564(b)(1) of the Act, 21 U.S.C.section 360bbb-3(b)(1), unless the authorization is terminated  or revoked sooner.       Influenza A by PCR NEGATIVE NEGATIVE Final   Influenza B by PCR NEGATIVE NEGATIVE Final    Comment: (NOTE) The Xpert Xpress SARS-CoV-2/FLU/RSV plus assay is intended as an aid in the diagnosis of influenza from Nasopharyngeal swab specimens and should not be used as a sole basis for treatment. Nasal washings and aspirates are unacceptable for Xpert Xpress SARS-CoV-2/FLU/RSV testing.  Fact Sheet for Patients: EntrepreneurPulse.com.au  Fact Sheet for Healthcare  Providers: IncredibleEmployment.be  This test is not yet approved or cleared by the [place] FDA and has been authorized for detection and/or diagnosis of SARS-CoV-2 by FDA under an Emergency Use Authorization (EUA). This EUA will remain in effect (meaning this test can be used) for the duration of the COVID-19 declaration under Section 564(b)(1) of the Act, 21 U.S.C. section 360bbb-3(b)(1), unless the authorization is terminated or revoked.  Performed at [facility], [place] [address] [place], [place] [address]   Surgical PCR screen     Status: None   Collection Time: 05/20/21 11:34 PM   Specimen: Nasal Mucosa; Nasal Swab  Result Value Ref Range Status   MRSA, PCR NEGATIVE NEGATIVE Final   Staphylococcus aureus NEGATIVE NEGATIVE Final    Comment: (NOTE) The Xpert SA Assay (FDA approved for NASAL specimens in patients 50 years of age and older), is one component of a comprehensive surveillance program. It is not intended to diagnose infection nor to guide or monitor treatment. Performed at [facility], [place] [address] [place], [place] [address]     Time coordinating discharge: 35 minutes  Signed: Nihira Puello  [facility] 05/24/2021, 11:25 AM

## 2021-05-24 NOTE — [place] FL2 (Signed)
?[place] MEDICAID FL2 LEVEL OF CARE SCREENING TOOL  ?  ? ?IDENTIFICATION  ?Patient Name: ?Darin [name] Birthdate: 04-16-1931 Sex: male Admission Date (Current Location): ?05/20/2021  ?[place] and [place] Number: ? [place] ?  Facility and Address:  ?[facility] [place], [place] [address] [place], [place] [address] ?     Provider Number: ?[id number]  ?Attending Physician Name and Address:  ?Terrilee Croak, MD ? Relative Name and Phone Number:  ?Zaleon,Carolyn Daughter [phone]  [phone] ?   ?Current Level of Care: ?Hospital Recommended Level of Care: ?[place] Prior Approval Number: ?  ? ?Date Approved/Denied: ?  PASRR Number: ?[id number] A ? ?Discharge Plan: ?SNF ?  ? ?Current Diagnoses: ?Patient Active Problem List  ? Diagnosis Date Noted  ? Tibia/fibula fracture, right, closed, initial encounter 05/20/2021  ? Callus 04/27/2021  ? Partial tear of common extensor tendon of elbow 03/13/2021  ? Arthritis of right ankle 10/31/2020  ? Trigger finger, left ring finger 09/15/2020  ? Greater trochanteric bursitis of left hip 02/12/2019  ? Strain of left piriformis muscle 10/05/2018  ? Pain due to onychomycosis of toenails of both feet 09/23/2018  ? Chronic left SI joint pain 04/27/2018  ? Right wrist pain 02/05/2018  ? Degenerative arthritis of right knee 10/23/2017  ? Left leg cellulitis 08/12/2017  ? Arthritis of right knee 05/20/2017  ? Diverticulosis of colon with hemorrhage   ? Acute blood loss anemia   ? GIB (gastrointestinal bleeding) 03/12/2017  ? Leg edema, right 02/20/2015  ? BPH (benign prostatic hyperplasia) 12/09/2014  ? Chronic radicular low back pain 08/06/2012  ? COPD (chronic obstructive pulmonary disease) ([place]) 04/21/2012  ? Syncope 03/29/2012  ? Hyponatremia 03/29/2012  ? Localized osteoarthrosis not specified whether primary or secondary, lower leg 06/20/2010  ? VARICOSE VEINS LOWER EXTREMITIES W/INFLAMMATION 02/12/2010  ? [place] LOCALIZED OSTEOARTHROSIS INVOLVING HAND  10/16/2009  ? [place], RIGHT 07/04/2009  ? DEGENERATIVE DISC DISEASE, LUMBOSACRAL SPINE W/RADICULOPATHY 06/19/2009  ? CARCINOMA, BASAL CELL 02/07/2009  ? ANEMIA OF CHRONIC DISEASE 02/07/2009  ? BURSITIS, RIGHT SHOULDER 10/07/2008  ? CATARACT ASSOCIATED WITH OTHER SYNDROMES 08/01/2008  ? DRY SKIN 08/01/2008  ? CERUMEN IMPACTION, BILATERAL 03/01/2008  ? PMR (polymyalgia rheumatica) (HCC) 01/18/2008  ? CRAMP IN LIMB 01/18/2008  ? DEPRESSION, SITUATIONAL, ACUTE 12/28/2007  ? CONSTIPATION, DRUG INDUCED 12/28/2007  ? POLYNEUROPATHY OTHER DISEASES CLASSIFIED ELSW 09/24/2007  ? WEIGHT LOSS, ABNORMAL 01/07/2007  ? Hyperlipidemia 09/22/2006  ? Essential hypertension 09/22/2006  ? GERD (gastroesophageal reflux disease) 09/22/2006  ? Osteoarthritis 09/22/2006  ? ? ?Orientation RESPIRATION BLADDER Height & Weight   ?  ?Self, Time, Situation, Place ? O2 Continent, External catheter Weight: 200 lb 3.2 oz (90.8 kg) ?Height:  6\' 1"  (185.4 cm)  ?BEHAVIORAL SYMPTOMS/MOOD NEUROLOGICAL BOWEL NUTRITION STATUS  ?    Continent Diet (see discharge summary)  ?AMBULATORY STATUS COMMUNICATION OF NEEDS Skin   ?Total Care Verbally Surgical wounds ?  ?  ?  ?    ?     ?     ? ? ?Personal Care Assistance Level of Assistance  ?Bathing, Feeding, Dressing, Total care Bathing Assistance: Maximum assistance ?Feeding assistance: Independent ?Dressing Assistance: Maximum assistance ?Total Care Assistance: Maximum assistance  ? ?Functional Limitations Info  ?Sight, Hearing, Speech Sight Info: Adequate ?Hearing Info: Adequate ?Speech Info: Adequate  ? ? ?SPECIAL CARE FACTORS FREQUENCY  ?PT (By licensed PT), OT (By licensed OT)   ?  ?PT Frequency: 5x week ?OT Frequency: 5x week ?  ?  ?  ?   ? ? ?  Contractures Contractures Info: Not present  ? ? ?Additional Factors Info  ?Code Status, Allergies Code Status Info: full ?Allergies Info: statins ?  ?  ?  ?   ? ?Current Medications (05/24/2021):  This is the current hospital active medication  list ?Current Facility-Administered Medications  ?Medication Dose Route Frequency Provider Last Rate Last Admin  ? acetaminophen (TYLENOL) tablet 1,000 mg  1,000 mg Oral Q8H Ainsley Spinner, PA-C   1,000 mg at 05/24/21 7026  ? amLODipine (NORVASC) tablet 5 mg  5 mg Oral QHS Ainsley Spinner, PA-C   5 mg at 05/23/21 2016  ? ascorbic acid (VITAMIN C) tablet 500 mg  500 mg Oral Daily Ainsley Spinner, PA-C   500 mg at 05/23/21 3785  ? cholecalciferol (VITAMIN D3) tablet 1,000 Units  1,000 Units Oral QHS Ainsley Spinner, PA-C   1,000 Units at 05/23/21 2016  ? enoxaparin (LOVENOX) injection 40 mg  40 mg Subcutaneous Daily Franky Macho, RPH   40 mg at 05/23/21 8850  ? famotidine (PEPCID) tablet 20 mg  20 mg Oral QHS Ainsley Spinner, PA-C   20 mg at 05/23/21 2016  ? feeding supplement (ENSURE ENLIVE / ENSURE PLUS) liquid 237 mL  237 mL Oral BID BM Dahal, Binaya, MD      ? fentaNYL (SUBLIMAZE) injection 50 mcg  50 mcg Intravenous Q2H PRN Ainsley Spinner, PA-C   50 mcg at 05/23/21 0211  ? finasteride (PROSCAR) tablet 5 mg  5 mg Oral Daily Ainsley Spinner, PA-C   5 mg at 05/23/21 2774  ? gabapentin (NEURONTIN) capsule 100 mg  100 mg Oral QID Ainsley Spinner, PA-C   100 mg at 05/23/21 2016  ? magnesium oxide (MAG-OX) tablet 400 mg  400 mg Oral Daily Ainsley Spinner, PA-C   400 mg at 05/23/21 1287  ? multivitamin with minerals tablet 1 tablet  1 tablet Oral Daily Dahal, Marlowe Aschoff, MD   1 tablet at 05/23/21 1433  ? mupirocin ointment (BACTROBAN) 2 % 1 application  1 application Nasal BID Ainsley Spinner, PA-C   1 application at 86/76/72 2015  ? oxyCODONE (Oxy IR/ROXICODONE) immediate release tablet 5 mg  5 mg Oral Q4H PRN Ainsley Spinner, PA-C   5 mg at 05/23/21 2016  ? pantoprazole (PROTONIX) EC tablet 40 mg  40 mg Oral Daily Ainsley Spinner, PA-C   40 mg at 05/23/21 0947  ? polyethylene glycol (MIRALAX / GLYCOLAX) packet 17 g  17 g Oral BID Ainsley Spinner, PA-C   17 g at 05/23/21 2015  ? sodium chloride flush (NS) 0.9 % injection 3 mL  3 mL Intravenous Q12H Ainsley Spinner, PA-C   3  mL at 05/23/21 2018  ? ? ? ?Discharge Medications: ?Please see discharge summary for a list of discharge medications. ? ?Relevant Imaging Results: ? ?Relevant Lab Results: ? ? ?Additional Information ?SSN: [id number], pt is vaccinated for covid with 3 boosters. ? ?Joanne Chars, LCSW ? ? ? ? ?

## 2021-05-24 NOTE — TOC Initial Note (Signed)
Transition of Care (TOC) - Initial/Assessment Note  ? ? ?Patient Details  ?Name: Darin [name] ?MRN: [id number] ?Date of Birth: 09-Apr-1931 ? ?Transition of [facility]) CM/SW Contact:    ?Joanne Chars, LCSW ?Phone Number: ?05/24/2021, 10:26 AM ? ?Clinical Narrative:    CSW met with pt regarding DC recommendation for SNF.  Pt lives alone, agreeable to SNF, choice document given, first choice would be [facility], does not want [facility].  Permission given to send out referral on hub, permission given to speak with daughter [name].  Pt is vaccinated for covid with 3 boosters. ? ?Referral sent out on hub.               ? ? ?Expected Discharge Plan: [place] ?Barriers to Discharge: SNF Pending bed offer ? ? ?Patient Goals and CMS Choice ?Patient states their goals for this hospitalization and ongoing recovery are:: remain independent ?CMS Medicare.gov Compare Post Acute Care list provided to:: Patient ?Choice offered to / list presented to : Patient ? ?Expected Discharge Plan and Services ?Expected Discharge Plan: [place] ?In-house Referral: Clinical Social Work ?  ?Post Acute Care Choice: [place] ?Living arrangements for the past 2 months: [place] ?                ?  ?  ?  ?  ?  ?  ?  ?  ?  ?  ? ?Prior Living Arrangements/Services ?Living arrangements for the past 2 months: [place] ?Lives with:: Self ?Patient language and need for interpreter reviewed:: Yes ?Do you feel safe going back to the place where you live?: Yes      ?Need for Family Participation in Patient Care: No (Comment) ?Care giver support system in place?: Yes (comment) ?Current home services: Other (comment) (none) ?Criminal Activity/Legal Involvement Pertinent to Current Situation/Hospitalization: No - Comment as needed ? ?Activities of Daily Living ?  ?  ? ?Permission Sought/Granted ?Permission sought to share information with : Family Supports ?Permission granted to share  information with : Yes, Verbal Permission Granted ? Share Information with NAME: daughter [name] ? Permission granted to share info w AGENCY: SNF ?   ?   ? ?Emotional Assessment ?Appearance:: Appears stated age ?Attitude/Demeanor/Rapport: Engaged ?Affect (typically observed): Appropriate, Pleasant ?Orientation: : Oriented to Self, Oriented to Place, Oriented to  Time, Oriented to Situation ?Alcohol / Substance Use: Not Applicable ?Psych Involvement: No (comment) ? ?Admission diagnosis:  Syncope and collapse [R55] ?Fall, initial encounter [W19.XXXA] ?Closed fracture of right tibia and fibula, initial encounter [S82.201A, S82.401A] ?Tibia/fibula fracture, right, closed, initial encounter [S82.201A, S82.401A] ?Patient Active Problem List  ? Diagnosis Date Noted  ? Tibia/fibula fracture, right, closed, initial encounter 05/20/2021  ? Callus 04/27/2021  ? Partial tear of common extensor tendon of elbow 03/13/2021  ? Arthritis of right ankle 10/31/2020  ? Trigger finger, left ring finger 09/15/2020  ? Greater trochanteric bursitis of left hip 02/12/2019  ? Strain of left piriformis muscle 10/05/2018  ? Pain due to onychomycosis of toenails of both feet 09/23/2018  ? Chronic left SI joint pain 04/27/2018  ? Right wrist pain 02/05/2018  ? Degenerative arthritis of right knee 10/23/2017  ? Left leg cellulitis 08/12/2017  ? Arthritis of right knee 05/20/2017  ? Diverticulosis of colon with hemorrhage   ? Acute blood loss anemia   ? GIB (gastrointestinal bleeding) 03/12/2017  ? Leg edema, right 02/20/2015  ? BPH (benign prostatic hyperplasia) 12/09/2014  ? Chronic radicular low  back pain 08/06/2012  ? COPD (chronic obstructive pulmonary disease) ([place]) 04/21/2012  ? Syncope 03/29/2012  ? Hyponatremia 03/29/2012  ? Localized osteoarthrosis not specified whether primary or secondary, lower leg 06/20/2010  ? VARICOSE VEINS LOWER EXTREMITIES W/INFLAMMATION 02/12/2010  ? [place] LOCALIZED OSTEOARTHROSIS INVOLVING HAND 10/16/2009  ?  [place], RIGHT 07/04/2009  ? DEGENERATIVE DISC DISEASE, LUMBOSACRAL SPINE W/RADICULOPATHY 06/19/2009  ? CARCINOMA, BASAL CELL 02/07/2009  ? ANEMIA OF CHRONIC DISEASE 02/07/2009  ? BURSITIS, RIGHT SHOULDER 10/07/2008  ? CATARACT ASSOCIATED WITH OTHER SYNDROMES 08/01/2008  ? DRY SKIN 08/01/2008  ? CERUMEN IMPACTION, BILATERAL 03/01/2008  ? PMR (polymyalgia rheumatica) (HCC) 01/18/2008  ? CRAMP IN LIMB 01/18/2008  ? DEPRESSION, SITUATIONAL, ACUTE 12/28/2007  ? CONSTIPATION, DRUG INDUCED 12/28/2007  ? POLYNEUROPATHY OTHER DISEASES CLASSIFIED ELSW 09/24/2007  ? WEIGHT LOSS, ABNORMAL 01/07/2007  ? Hyperlipidemia 09/22/2006  ? Essential hypertension 09/22/2006  ? GERD (gastroesophageal reflux disease) 09/22/2006  ? Osteoarthritis 09/22/2006  ? ?PCP:  [name], MD ?Pharmacy:   ?[place] ?[place] [address] ?Phone: [phone] Fax: [phone] ? ? ? ? ?Social Determinants of Health (SDOH) Interventions ?  ? ?Readmission Risk Interventions ?No flowsheet data found. ? ? ?

## 2021-05-24 NOTE — Progress Notes (Addendum)
PTAR at bedside. Attempted to call report for transfer to [facility].  Left message on voicemail with number to call.  Will attempt later.  ?

## 2021-05-24 NOTE — TOC Transition Note (Signed)
Transition of Care (TOC) - CM/SW Discharge Note ? ? ?Patient Details  ?Name: Zyion Doxtater ?MRN: [id number] ?Date of Birth: 1932/01/02 ? ?Transition of Care [facility]) CM/SW Contact:  ?Joanne Chars, LCSW ?Phone Number: ?05/24/2021, 11:54 AM ? ? ?Clinical Narrative:   Pt discharging to [facility].  RN call [phone] for report.   ? ? ? ?Final next level of care: [place] ?Barriers to Discharge: Barriers Resolved ? ? ?Patient Goals and CMS Choice ?Patient states their goals for this hospitalization and ongoing recovery are:: remain independent ?CMS Medicare.gov Compare Post Acute Care list provided to:: Patient ?Choice offered to / list presented to : Patient ? ?Discharge Placement ?  ?           ?Patient chooses bed at:  [facility]) ?Patient to be transferred to facility by: PTAR ?Name of family member notified: daughter [name] ?Patient and family notified of of transfer: 05/24/21 ? ?Discharge Plan and Services ?In-house Referral: Clinical Social Work ?  ?Post Acute Care Choice: [place]          ?  ?  ?  ?  ?  ?  ?  ?  ?  ?  ? ?Social Determinants of Health (SDOH) Interventions ?  ? ? ?Readmission Risk Interventions ?No flowsheet data found. ? ? ? ? ?

## 2021-05-24 NOTE — Progress Notes (Signed)
? ?                              Orthopaedic Trauma Service Progress Note ? ?Patient ID: ?Darin [name] ?MRN: [id number] ?DOB/AGE: [birth year] 88 y.o. ? ?Subjective: ? ?Doing ok  ?More pain at R ankle than knee ?Good appetite  ?Using condom cath  ?Mobilized with therapy to chair yesterday  ? ?Not sure what SNF he wants to go to  ? ?Compression socks ordered yesterday but 20-56mmHg socks brought.  Too difficult to get on.  I have communicated directly with hanger to bring the socks I initially requested (15-50mmHg) ?Still waiting for zero knee bone foam as we are out of stock in the hospital  ? ?ROS ?As above ? ?Objective:  ? ?VITALS:   ?Vitals:  ? 05/23/21 0734 05/23/21 2006 05/24/21 0422 05/24/21 0724  ?BP: 106/89 (!) 121/54 (!) 126/56 (!) 133/59  ?Pulse: 89  79 82  ?Resp: 20 18 19    ?Temp: 98 ?F (36.7 ?C) 98.4 ?F (36.9 ?C) 98.2 ?F (36.8 ?C) 98.4 ?F (36.9 ?C)  ?TempSrc: Oral Oral Oral Oral  ?SpO2: 91% 93% 92%   ?Weight:      ?Height:      ? ? ?Estimated body mass index is 26.41 kg/m? as calculated from the following: ?  Height as of this encounter: 6\' 1"  (1.854 m). ?  Weight as of this encounter: 90.8 kg. ? ? ?Intake/Output   ?   03/01 0701 ?03/02 0700 03/02 0701 ?03/03 0700  ? P.O. 360   ? I.V. (mL/kg)    ? IV Piggyback    ? Total Intake(mL/kg) 360 (4)   ? Urine (mL/kg/hr) 1150 (0.5)   ? Stool 0   ? Blood    ? Total Output 1150   ? Net -790   ?     ? Stool Occurrence 1 x   ?  ? ?LABS ? ?Results for orders placed or performed during the hospital encounter of 05/20/21 (from the past 24 hour(s))  ?CBC with Differential/Platelet     Status: Abnormal  ? Collection Time: 05/24/21  2:58 AM  ?Result Value Ref Range  ? WBC 6.5 4.0 - 10.5 K/uL  ? RBC 2.99 (L) 4.22 - 5.81 MIL/uL  ? Hemoglobin 9.5 (L) 13.0 - 17.0 g/dL  ? HCT 28.0 (L) 39.0 - 52.0 %  ? MCV 93.6 80.0 - 100.0 fL  ? MCH 31.8 26.0 - 34.0 pg  ? MCHC 33.9 30.0 - 36.0 g/dL  ? RDW 13.2 11.5 - 15.5 %  ? Platelets 156 150 - 400 K/uL   ? nRBC 0.0 0.0 - 0.2 %  ? Neutrophils Relative % 61 %  ? Neutro Abs 4.0 1.7 - 7.7 K/uL  ? Lymphocytes Relative 21 %  ? Lymphs Abs 1.3 0.7 - 4.0 K/uL  ? Monocytes Relative 14 %  ? Monocytes Absolute 0.9 0.1 - 1.0 K/uL  ? Eosinophils Relative 3 %  ? Eosinophils Absolute 0.2 0.0 - 0.5 K/uL  ? Basophils Relative 0 %  ? Basophils Absolute 0.0 0.0 - 0.1 K/uL  ? Immature Granulocytes 1 %  ? Abs Immature Granulocytes 0.04 0.00 - 0.07 K/uL  ?Basic metabolic panel     Status: Abnormal  ? Collection Time: 05/24/21  2:58 AM  ?Result Value Ref Range  ? Sodium 137 135 - 145 mmol/L  ? Potassium 3.6 3.5 - 5.1 mmol/L  ? Chloride 103 98 - 111  mmol/L  ? CO2 26 22 - 32 mmol/L  ? Glucose, Bld 118 (H) 70 - 99 mg/dL  ? BUN 18 8 - 23 mg/dL  ? Creatinine, Ser 0.79 0.61 - 1.24 mg/dL  ? Calcium 8.6 (L) 8.9 - 10.3 mg/dL  ? GFR, Estimated >60 >60 mL/min  ? Anion gap 8 5 - 15  ?Glucose, capillary     Status: Abnormal  ? Collection Time: 05/24/21  6:29 AM  ?Result Value Ref Range  ? Glucose-Capillary 105 (H) 70 - 99 mg/dL  ? ? ? ?PHYSICAL EXAM:  ? ?Gen: sitting up in bed, NAD, appears well, pleasant  ?Lungs: unlabored  ?Cardiac: reg ?Abd: + BS, NTND  ?Ext:  ?     Right Lower Extremity  ? Dressing removed ? All wounds are stable ? Fracture blisters and ecchymosis stable to mid leg  ? Ext warm  ? + DP pulse ? DPN, SPN, TN sensation intact ? EHL, FHL, lesser toe motor intact ? Ankle flexion, extension, inversion and eversion grossly intact ? No DCT ? Allows me to move leg for dressing change without much discomfort  ? ? ?Assessment/Plan: ?2 Days Post-Op  ? ?Principal Problem: ?  Tibia/fibula fracture, right, closed, initial encounter ?Active Problems: ?  Hyperlipidemia ?  Essential hypertension ?  GERD (gastroesophageal reflux disease) ?  Syncope ?  COPD (chronic obstructive pulmonary disease) ([place]) ?  BPH (benign prostatic hyperplasia) ? ? ?Anti-infectives (From admission, onward)  ? ? Start     Dose/Rate Route Frequency Ordered Stop  ? 05/22/21  1630  ceFAZolin (ANCEF) IVPB 2g/100 mL premix       ? 2 g ?200 mL/hr over 30 Minutes Intravenous Every 8 hours 05/22/21 1531 05/23/21 0633  ? 05/22/21 0800  ceFAZolin (ANCEF) IVPB 2g/100 mL premix       ? 2 g ?200 mL/hr over 30 Minutes Intravenous On call to O.R. 05/22/21 0554 05/22/21 1201  ? 05/20/21 1515  ceFAZolin (ANCEF) IVPB 1 g/50 mL premix       ? 1 g ?100 mL/hr over 30 Minutes Intravenous  Once 05/20/21 1506 05/20/21 1554  ? ?  ?. ? ?POD/HD#: 2 ? ?88 y/o male s/p ground level fall with R proximal tibia and fibula fracture ?  ?-ground level fall ?  ?- R proximal tibia fracture (extra-articular), R proximal fibula fracture s/p IMN  ?            NWB R leg with assistance ?            Unrestricted ROM R knee and ankle ?            Ice and elevate ?            Do not put pillows under knee at rest, pt already has baseline knee contracture  ?             ?            Therapy evals  ?  ?            Dressing changed today  ?  Applied new mepilex to the wounds  ?  Once new compression socks arrive with have those placed on the leg  ?  ?- Pain management: ?            Multimodal  ?            Maximize non-narcotics  ?  ?- ABL anemia/Hemodynamics ?  Stable ?            Monitor  ?  ?- Medical issues  ?            Per primary  ?  ?- DVT/PE prophylaxis: ?            Lovenox x 21 days ?  ?- ID:  ?            Periop abx ?  ?- Metabolic Bone Disease: ?            Known osteoporosis  ?            Vitamin d levels pending  ?            I do not see any DEXAs in the system ?            Recommend dexa in the next 4-8 weeks ?            Refer to osteoporosis clinic  ? ?- FEN ? Dc condom cath  ?  ?- Activity: ?            Up with therapy  ?            NWB R leg ?             ?- Impediments to fracture healing: ?            Osteoporosis ?            Low energy fracture  ?            Increased fall risk  ?  ?- Dispo: ?            Therapy evals ?            Likely needs snf  ? ?Jari Pigg, PA-C ?[phone]  (C) ?05/24/2021, 9:30 AM ? ?[facility] ?[place] [address] ?[phone] Jenetta Downer) ?[phone] (F) ? ? ? ?After 5pm and on the weekends please log on to Amion, go to orthopaedics and the look under the [facility] Call for the provider(s) on call. You can also call our office at [phone] and then follow the prompts to be connected to the call team.  ? Patient ID: Darin [name], male   DOB: [birth year], 88 y.o.   MRN: [id number] ? ?

## 2021-05-24 NOTE — Telephone Encounter (Signed)
Please advise 

## 2021-05-25 NOTE — Telephone Encounter (Signed)
Patient informed of message verbalized understanding  ?

## 2021-05-28 DIAGNOSIS — S51012A Laceration without foreign body of left elbow, initial encounter: Secondary | ICD-10-CM | POA: Diagnosis not present

## 2021-05-28 DIAGNOSIS — K5900 Constipation, unspecified: Secondary | ICD-10-CM | POA: Diagnosis not present

## 2021-05-28 DIAGNOSIS — R2241 Localized swelling, mass and lump, right lower limb: Secondary | ICD-10-CM | POA: Diagnosis not present

## 2021-05-28 DIAGNOSIS — S61512A Laceration without foreign body of left wrist, initial encounter: Secondary | ICD-10-CM | POA: Diagnosis not present

## 2021-05-30 DIAGNOSIS — M79604 Pain in right leg: Secondary | ICD-10-CM | POA: Diagnosis not present

## 2021-05-30 DIAGNOSIS — S61512D Laceration without foreign body of left wrist, subsequent encounter: Secondary | ICD-10-CM | POA: Diagnosis not present

## 2021-05-30 DIAGNOSIS — S51012D Laceration without foreign body of left elbow, subsequent encounter: Secondary | ICD-10-CM | POA: Diagnosis not present

## 2021-05-30 DIAGNOSIS — Z7689 Persons encountering health services in other specified circumstances: Secondary | ICD-10-CM | POA: Diagnosis not present

## 2021-05-30 DIAGNOSIS — R21 Rash and other nonspecific skin eruption: Secondary | ICD-10-CM | POA: Diagnosis not present

## 2021-05-30 DIAGNOSIS — R070 Pain in throat: Secondary | ICD-10-CM | POA: Diagnosis not present

## 2021-05-31 DIAGNOSIS — S82201D Unspecified fracture of shaft of right tibia, subsequent encounter for closed fracture with routine healing: Secondary | ICD-10-CM | POA: Diagnosis not present

## 2021-05-31 DIAGNOSIS — S51002A Unspecified open wound of left elbow, initial encounter: Secondary | ICD-10-CM | POA: Diagnosis not present

## 2021-05-31 DIAGNOSIS — Z298 Encounter for other specified prophylactic measures: Secondary | ICD-10-CM | POA: Diagnosis not present

## 2021-05-31 DIAGNOSIS — S51802A Unspecified open wound of left forearm, initial encounter: Secondary | ICD-10-CM | POA: Diagnosis not present

## 2021-06-01 ENCOUNTER — Other Ambulatory Visit: Payer: Self-pay | Admitting: *Deleted

## 2021-06-01 NOTE — Patient Outreach (Signed)
Per [place] eligible member resides in [facility] SNF.  Screened for potential [facility] care coordination services as a benefit of [employer] plan. ? ?Member's PCP at [employer] [place] has [place] care coordination services available. Member is active with CCM services with Pharmacist at PCP office.  ? ?[name] admitted to SNF on 05/24/21 after hospitalization. ? ?Communication sent to facility SW and Admissions Coordinator to make aware writer is following for transition plans and [facility] needs. ? ?Will continue to follow while member resides in SNF.  ? ? ? ?Darin Rolling, MSN, RN,BSN ?[place] Coordinator ?[phone] [facility]) ?[phone]  (Toll free office)   ?

## 2021-06-04 ENCOUNTER — Other Ambulatory Visit: Payer: Self-pay | Admitting: *Deleted

## 2021-06-04 NOTE — Patient Outreach (Signed)
THN Post- Acute Care Coordinator follow up. Per [place] eligible member resides in [facility] SNF. Screened for [facility] care coordination services as a benefit of [employer] plan. ? ?Member's PCP at [employer] [facility] has [place] care coordination team available if needed. Darin [name] is active with CCM services with the Pharmacist at [facility]. ? ?Facility site visit to [facility] skilled nursing facility. Went to bedside to speak Darin [name]. However he was eating lunch. Conversation brief.  ? ?Provided [facility] Care Management brochure 24-hr nurse advice line magnet, and writer's contact information.  ? ?Sent update to Pharmacist with CCM team at PCP office to make aware writer is following Darin [name] while he resides in SNF.  ? ? ? ?Marthenia Rolling, MSN, RN,BSN ?[place] Coordinator ?[phone] [facility]) ?[phone]  (Toll free office)  ? ? ? ? ?  ?

## 2021-06-15 ENCOUNTER — Ambulatory Visit: Payer: Medicare Other | Admitting: Family Medicine

## 2021-06-20 DIAGNOSIS — S82231D Displaced oblique fracture of shaft of right tibia, subsequent encounter for closed fracture with routine healing: Secondary | ICD-10-CM | POA: Diagnosis not present

## 2021-06-26 ENCOUNTER — Ambulatory Visit: Payer: Medicare Other

## 2021-06-26 ENCOUNTER — Other Ambulatory Visit: Payer: Self-pay | Admitting: *Deleted

## 2021-06-26 NOTE — Patient Outreach (Signed)
THN Post- Acute Care Coordinator follow up. Per [place] eligible member currently resides in [facility] SNF.  Screening for potential [facility] Care Management as a benefit of member's insurance plan. ? ?Facility site visit to [facility] skilled nursing facility. Met with Darin [name] at bedside at [facility] to discuss transition plans and potential [facility] services. Darin [name] reports he lives alone. States he plans to return home upon SNF discharge. Discussed Probation officer making referral for CCM services with his PCP office. Darin [name] is agreeable. States he is familiar with speaking with the Pharmacist at PCP office.  ? ?Confirmed PCP is Dr. Elease Hashimoto with [place] [facility] [place]. ? ?Member's PCP at [facility] [facility] has [place] care coordination team available. CCM services would need to be ordered by PCP. Darin [name] has been active with pharmacy CCM services. ? ?Provided [facility] Care Management brochure, 24-hr nurse advice line magnet, and writer's contact information.  ? ?Will continue to collaborate with SNF SW and follow transition plans/needs while member resides in SNF.  ? ? ?Marthenia Rolling, MSN, RN,BSN ?[place] Coordinator ?[phone] [facility]) ?[phone]  (Toll free office)  ? ? ? ? ?              ?

## 2021-07-03 DIAGNOSIS — M79662 Pain in left lower leg: Secondary | ICD-10-CM | POA: Diagnosis not present

## 2021-07-03 DIAGNOSIS — F32A Depression, unspecified: Secondary | ICD-10-CM | POA: Diagnosis not present

## 2021-07-03 DIAGNOSIS — M79661 Pain in right lower leg: Secondary | ICD-10-CM | POA: Diagnosis not present

## 2021-07-03 DIAGNOSIS — F1090 Alcohol use, unspecified, uncomplicated: Secondary | ICD-10-CM | POA: Diagnosis not present

## 2021-07-03 DIAGNOSIS — L853 Xerosis cutis: Secondary | ICD-10-CM | POA: Diagnosis not present

## 2021-07-09 DIAGNOSIS — L299 Pruritus, unspecified: Secondary | ICD-10-CM | POA: Diagnosis not present

## 2021-07-09 DIAGNOSIS — M79661 Pain in right lower leg: Secondary | ICD-10-CM | POA: Diagnosis not present

## 2021-07-09 DIAGNOSIS — L209 Atopic dermatitis, unspecified: Secondary | ICD-10-CM | POA: Diagnosis not present

## 2021-07-09 DIAGNOSIS — R2241 Localized swelling, mass and lump, right lower limb: Secondary | ICD-10-CM | POA: Diagnosis not present

## 2021-07-10 ENCOUNTER — Other Ambulatory Visit: Payer: Self-pay | Admitting: *Deleted

## 2021-07-10 NOTE — Patient Outreach (Signed)
THN Post- Acute Care Coordinator follow up. Per [place] eligible member currently resides in [facility] SNF.  Screening for potential [facility] Care Management services as a benefit of member's insurance plan. ? ?Secure communication sent to facility SW and Admissions Coordinator to inquire about transition plans. ? ?Will plan follow up with Darin [name] regarding [place] Management services.  ? ? ?Darin Rolling, MSN, Darin [name],Darin [name] ?[place] Coordinator ?[phone] [facility]) ?[phone]  (Toll free office)   ?

## 2021-07-11 DIAGNOSIS — I82411 Acute embolism and thrombosis of right femoral vein: Secondary | ICD-10-CM | POA: Diagnosis not present

## 2021-07-11 DIAGNOSIS — R600 Localized edema: Secondary | ICD-10-CM | POA: Diagnosis not present

## 2021-07-11 DIAGNOSIS — S82201D Unspecified fracture of shaft of right tibia, subsequent encounter for closed fracture with routine healing: Secondary | ICD-10-CM | POA: Diagnosis not present

## 2021-07-12 ENCOUNTER — Other Ambulatory Visit: Payer: Self-pay | Admitting: *Deleted

## 2021-07-12 NOTE — Patient Outreach (Signed)
THN Post- Acute Care Coordinator follow up. Per [place] eligible member currently resides in [facility] SNF.  Screened for potential [facility] care coordination services as a benefit of [employer] plan. ? ?Member's PCP at [employer] has [place] care coordination services. CCM services ordered by PCP if appropriate. ? ?Update received from [facility] Admissions Coordinator indicating Mr. Geffre will transition home with support.  ? ?Writer to follow up with member to discuss [facility] services. ? ? ?Marthenia Rolling, MSN, RN,BSN ?[place] Coordinator ?[phone] [facility]) ?[phone]  (Toll free office)  ? ?

## 2021-07-13 DIAGNOSIS — I82411 Acute embolism and thrombosis of right femoral vein: Secondary | ICD-10-CM | POA: Diagnosis not present

## 2021-07-17 ENCOUNTER — Other Ambulatory Visit: Payer: Self-pay | Admitting: *Deleted

## 2021-07-17 NOTE — Patient Outreach (Signed)
THN Post- Acute Care Coordinator follow up. Per [place] eligible member currently resides in [facility] SNF.  Screened for potential [facility] care coordination services as a benefit of [employer] plan. ? ?Facility site visit to [facility] skilled nursing facility. Met with Mr. Dennie concerning transition plan and potential [facility] needs. Mr. Santana reports he continues to work with therapy. States the plan is to return home. Reports he has friends that will assist with meals and transportation to MD appointments if needed. Discussed [facility]N follow up. Mr. Friede agreeable.  ? ?Will continue to follow while member resides in SNF. Will plan to make referral to [place] Management upon SNF discharge.  ? ?Mr. Bounds still has [place] Management brochure and contact information at bedside. ? ?Confirmed best contact number for Mr. Panas is [phone]. ?Confirmed PCP is Dr. Elease Hashimoto. ? ? ? ?Marthenia Rolling, MSN, RN,BSN ?[place] Coordinator ?[phone] [facility]) ?[phone]  (Toll free office)  ? ?

## 2021-07-18 DIAGNOSIS — S82231D Displaced oblique fracture of shaft of right tibia, subsequent encounter for closed fracture with routine healing: Secondary | ICD-10-CM | POA: Diagnosis not present

## 2021-07-18 DIAGNOSIS — M1711 Unilateral primary osteoarthritis, right knee: Secondary | ICD-10-CM | POA: Diagnosis not present

## 2021-07-25 DIAGNOSIS — Z20822 Contact with and (suspected) exposure to covid-19: Secondary | ICD-10-CM | POA: Diagnosis not present

## 2021-07-27 ENCOUNTER — Ambulatory Visit: Payer: Medicare Other | Admitting: Podiatry

## 2021-07-30 ENCOUNTER — Other Ambulatory Visit: Payer: Self-pay | Admitting: *Deleted

## 2021-07-30 NOTE — Patient Outreach (Signed)
THN Post- Acute Care Coordinator follow up. Per [place] eligible member currently resides in [facility] SNF.  Screening for potential [facility] care coordination services as a benefit of [employer] plan. ? ?Facility site visit to [facility] skilled nursing facility. Met with Mr. Nickson at bedside to discuss transition plan. Mr. Lassen reports he plans to return home at dc. States he does not have family to assist. States he has friends that check on him periodically. [place] that he plans to return home rather than remaining LTC. Discussed possible outpatient palliative care referral. Mr. Dais declined.  ? ?[place] SNF SW in meeting during writer's visit. Therefore, unable to discuss transition plans. Secure message sent to SNF SW to inquire.  ? ?Member's PCP at [employer] [employer] has [place] care coordination team. Will plan to make referral upon SNF discharge. ? ?Will continue to follow while member resides in SNF.  ? ? ?Marthenia Rolling, MSN, RN,BSN ?[place] Coordinator ?[phone] [facility]) ?[phone]  (Toll free office)  ? ? ? ? ? ? ?  ?

## 2021-08-09 DIAGNOSIS — M6281 Muscle weakness (generalized): Secondary | ICD-10-CM | POA: Diagnosis not present

## 2021-08-09 DIAGNOSIS — Z9181 History of falling: Secondary | ICD-10-CM | POA: Diagnosis not present

## 2021-08-09 DIAGNOSIS — R2689 Other abnormalities of gait and mobility: Secondary | ICD-10-CM | POA: Diagnosis not present

## 2021-08-14 ENCOUNTER — Telehealth: Payer: Self-pay

## 2021-08-14 ENCOUNTER — Ambulatory Visit: Payer: Medicare Other

## 2021-08-14 NOTE — Telephone Encounter (Signed)
Unsuccessful attempt to reach patient on preferred number listed in notes for scheduled AWV. Left message on voicemail okay to reschedule. 

## 2021-08-15 ENCOUNTER — Other Ambulatory Visit: Payer: Self-pay | Admitting: Family Medicine

## 2021-08-15 DIAGNOSIS — J449 Chronic obstructive pulmonary disease, unspecified: Secondary | ICD-10-CM | POA: Diagnosis not present

## 2021-08-15 DIAGNOSIS — D638 Anemia in other chronic diseases classified elsewhere: Secondary | ICD-10-CM | POA: Diagnosis not present

## 2021-08-15 DIAGNOSIS — M810 Age-related osteoporosis without current pathological fracture: Secondary | ICD-10-CM | POA: Diagnosis not present

## 2021-08-15 DIAGNOSIS — S82401D Unspecified fracture of shaft of right fibula, subsequent encounter for closed fracture with routine healing: Secondary | ICD-10-CM | POA: Diagnosis not present

## 2021-08-15 DIAGNOSIS — K5900 Constipation, unspecified: Secondary | ICD-10-CM | POA: Diagnosis not present

## 2021-08-15 DIAGNOSIS — M720 Palmar fascial fibromatosis [Dupuytren]: Secondary | ICD-10-CM | POA: Diagnosis not present

## 2021-08-15 DIAGNOSIS — K449 Diaphragmatic hernia without obstruction or gangrene: Secondary | ICD-10-CM | POA: Diagnosis not present

## 2021-08-15 DIAGNOSIS — I10 Essential (primary) hypertension: Secondary | ICD-10-CM | POA: Diagnosis not present

## 2021-08-15 DIAGNOSIS — K219 Gastro-esophageal reflux disease without esophagitis: Secondary | ICD-10-CM | POA: Diagnosis not present

## 2021-08-15 DIAGNOSIS — L84 Corns and callosities: Secondary | ICD-10-CM | POA: Diagnosis not present

## 2021-08-15 DIAGNOSIS — I6523 Occlusion and stenosis of bilateral carotid arteries: Secondary | ICD-10-CM | POA: Diagnosis not present

## 2021-08-15 DIAGNOSIS — E871 Hypo-osmolality and hyponatremia: Secondary | ICD-10-CM | POA: Diagnosis not present

## 2021-08-15 DIAGNOSIS — M353 Polymyalgia rheumatica: Secondary | ICD-10-CM | POA: Diagnosis not present

## 2021-08-15 DIAGNOSIS — E785 Hyperlipidemia, unspecified: Secondary | ICD-10-CM | POA: Diagnosis not present

## 2021-08-15 DIAGNOSIS — Z9181 History of falling: Secondary | ICD-10-CM | POA: Diagnosis not present

## 2021-08-15 DIAGNOSIS — K5731 Diverticulosis of large intestine without perforation or abscess with bleeding: Secondary | ICD-10-CM | POA: Diagnosis not present

## 2021-08-15 DIAGNOSIS — Z79891 Long term (current) use of opiate analgesic: Secondary | ICD-10-CM | POA: Diagnosis not present

## 2021-08-15 DIAGNOSIS — I824Z1 Acute embolism and thrombosis of unspecified deep veins of right distal lower extremity: Secondary | ICD-10-CM | POA: Diagnosis not present

## 2021-08-15 DIAGNOSIS — G629 Polyneuropathy, unspecified: Secondary | ICD-10-CM | POA: Diagnosis not present

## 2021-08-15 DIAGNOSIS — M1990 Unspecified osteoarthritis, unspecified site: Secondary | ICD-10-CM | POA: Diagnosis not present

## 2021-08-15 DIAGNOSIS — N400 Enlarged prostate without lower urinary tract symptoms: Secondary | ICD-10-CM | POA: Diagnosis not present

## 2021-08-15 DIAGNOSIS — S82201D Unspecified fracture of shaft of right tibia, subsequent encounter for closed fracture with routine healing: Secondary | ICD-10-CM | POA: Diagnosis not present

## 2021-08-16 DIAGNOSIS — J449 Chronic obstructive pulmonary disease, unspecified: Secondary | ICD-10-CM | POA: Diagnosis not present

## 2021-08-16 DIAGNOSIS — N400 Enlarged prostate without lower urinary tract symptoms: Secondary | ICD-10-CM | POA: Diagnosis not present

## 2021-08-16 DIAGNOSIS — I10 Essential (primary) hypertension: Secondary | ICD-10-CM | POA: Diagnosis not present

## 2021-08-16 DIAGNOSIS — S82401D Unspecified fracture of shaft of right fibula, subsequent encounter for closed fracture with routine healing: Secondary | ICD-10-CM | POA: Diagnosis not present

## 2021-08-16 DIAGNOSIS — K219 Gastro-esophageal reflux disease without esophagitis: Secondary | ICD-10-CM | POA: Diagnosis not present

## 2021-08-16 DIAGNOSIS — S82201D Unspecified fracture of shaft of right tibia, subsequent encounter for closed fracture with routine healing: Secondary | ICD-10-CM | POA: Diagnosis not present

## 2021-08-17 ENCOUNTER — Telehealth: Payer: Self-pay | Admitting: Family Medicine

## 2021-08-17 NOTE — Telephone Encounter (Signed)
[place] home [facility] is calling and would like verbal order for PT 1x2, 2x4, 1x2 effective on 08-15-2021 also pt decline home health aid due to he has caregiver who comes in

## 2021-08-21 DIAGNOSIS — K219 Gastro-esophageal reflux disease without esophagitis: Secondary | ICD-10-CM | POA: Diagnosis not present

## 2021-08-21 DIAGNOSIS — S82201D Unspecified fracture of shaft of right tibia, subsequent encounter for closed fracture with routine healing: Secondary | ICD-10-CM | POA: Diagnosis not present

## 2021-08-21 DIAGNOSIS — N400 Enlarged prostate without lower urinary tract symptoms: Secondary | ICD-10-CM | POA: Diagnosis not present

## 2021-08-21 DIAGNOSIS — S82401D Unspecified fracture of shaft of right fibula, subsequent encounter for closed fracture with routine healing: Secondary | ICD-10-CM | POA: Diagnosis not present

## 2021-08-21 DIAGNOSIS — J449 Chronic obstructive pulmonary disease, unspecified: Secondary | ICD-10-CM | POA: Diagnosis not present

## 2021-08-21 DIAGNOSIS — I10 Essential (primary) hypertension: Secondary | ICD-10-CM | POA: Diagnosis not present

## 2021-08-21 NOTE — Telephone Encounter (Signed)
Left a detailed message on Darin [name]'s voicemail with the approval for orders as below.

## 2021-08-23 ENCOUNTER — Telehealth: Payer: Self-pay | Admitting: Family Medicine

## 2021-08-23 NOTE — Telephone Encounter (Signed)
Left message for patient to call back and schedule Medicare Annual Wellness Visit (AWV) either virtually or in office. Left  my Darin [name] number [phone]   Last AWV ;06/20/20  please schedule at anytime with [facility] Nurse Health Advisor 1 or 2

## 2021-08-27 DIAGNOSIS — S82231D Displaced oblique fracture of shaft of right tibia, subsequent encounter for closed fracture with routine healing: Secondary | ICD-10-CM | POA: Diagnosis not present

## 2021-08-27 DIAGNOSIS — M1711 Unilateral primary osteoarthritis, right knee: Secondary | ICD-10-CM | POA: Diagnosis not present

## 2021-08-28 ENCOUNTER — Telehealth: Payer: Self-pay

## 2021-08-28 ENCOUNTER — Ambulatory Visit (INDEPENDENT_AMBULATORY_CARE_PROVIDER_SITE_OTHER): Payer: Medicare Other

## 2021-08-28 VITALS — Ht 73.0 in | Wt 170.0 lb

## 2021-08-28 DIAGNOSIS — Z0000 Encounter for general adult medical examination without abnormal findings: Secondary | ICD-10-CM | POA: Diagnosis not present

## 2021-08-28 NOTE — Patient Instructions (Signed)
Darin [name] , Thank you for taking time to come for your Medicare Wellness Visit. I appreciate your ongoing commitment to your health goals. Please review the following plan we discussed and let me know if I can assist you in the future.   Screening recommendations/referrals: Colonoscopy: not required Recommended yearly ophthalmology/optometry visit for glaucoma screening and checkup Recommended yearly dental visit for hygiene and checkup  Vaccinations: Influenza vaccine: due 10/23/2021 Pneumococcal vaccine: completed 11/16/2013 Tdap vaccine: completed 06/15/2015, due 06/14/2025 Shingles vaccine: discussed   Covid-19:  01/04/2021, 07/13/2020, 11/26/2019, 04/23/2019, 04/02/2019  Advanced directives: copy in chart  Conditions/risks identified: none  Next appointment: Follow up in one year for your annual wellness visit.   Preventive Care 79 Years and Older, Male Preventive care refers to lifestyle choices and visits with your health care provider that can promote health and wellness. What does preventive care include? A yearly physical exam. This is also called an annual well check. Dental exams once or twice a year. Routine eye exams. Ask your health care provider how often you should have your eyes checked. Personal lifestyle choices, including: Daily care of your teeth and gums. Regular physical activity. Eating a healthy diet. Avoiding tobacco and drug use. Limiting alcohol use. Practicing safe sex. Taking low doses of aspirin every day. Taking vitamin and mineral supplements as recommended by your health care provider. What happens during an annual well check? The services and screenings done by your health care provider during your annual well check will depend on your age, overall health, lifestyle risk factors, and family history of disease. Counseling  Your health care provider may ask you questions about your: Alcohol use. Tobacco use. Drug use. Emotional well-being. Home  and relationship well-being. Sexual activity. Eating habits. History of falls. Memory and ability to understand (cognition). Work and work Statistician. Screening  You may have the following tests or measurements: Height, weight, and BMI. Blood pressure. Lipid and cholesterol levels. These may be checked every 5 years, or more frequently if you are over 29 years old. Skin check. Lung cancer screening. You may have this screening every year starting at age 4 if you have a 30-pack-year history of smoking and currently smoke or have quit within the past 15 years. Fecal occult blood test (FOBT) of the stool. You may have this test every year starting at age 77. Flexible sigmoidoscopy or colonoscopy. You may have a sigmoidoscopy every 5 years or a colonoscopy every 10 years starting at age 30. Prostate cancer screening. Recommendations will vary depending on your family history and other risks. Hepatitis C blood test. Hepatitis B blood test. Sexually transmitted disease (STD) testing. Diabetes screening. This is done by checking your blood sugar (glucose) after you have not eaten for a while (fasting). You may have this done every 1-3 years. Abdominal aortic aneurysm (AAA) screening. You may need this if you are a current or former smoker. Osteoporosis. You may be screened starting at age 73 if you are at high risk. Talk with your health care provider about your test results, treatment options, and if necessary, the need for more tests. Vaccines  Your health care provider may recommend certain vaccines, such as: Influenza vaccine. This is recommended every year. Tetanus, diphtheria, and acellular pertussis (Tdap, Td) vaccine. You may need a Td booster every 10 years. Zoster vaccine. You may need this after age 80. Pneumococcal 13-valent conjugate (PCV13) vaccine. One dose is recommended after age 24. Pneumococcal polysaccharide (PPSV23) vaccine. One dose is recommended after age  34. Talk to  your health care provider about which screenings and vaccines you need and how often you need them. This information is not intended to replace advice given to you by your health care provider. Make sure you discuss any questions you have with your health care provider. Document Released: 04/07/2015 Document Revised: 11/29/2015 Document Reviewed: 01/10/2015 Elsevier Interactive Patient Education  2017 [place] Prevention in the Home Falls can cause injuries. They can happen to people of all ages. There are many things you can do to make your home safe and to help prevent falls. What can I do on the outside of my home? Regularly fix the edges of walkways and driveways and fix any cracks. Remove anything that might make you trip as you walk through a door, such as a raised step or threshold. Trim any bushes or trees on the path to your home. Use bright outdoor lighting. Clear any walking paths of anything that might make someone trip, such as rocks or tools. Regularly check to see if handrails are loose or broken. Make sure that both sides of any steps have handrails. Any raised decks and porches should have guardrails on the edges. Have any leaves, snow, or ice cleared regularly. Use sand or salt on walking paths during winter. Clean up any spills in your garage right away. This includes oil or grease spills. What can I do in the bathroom? Use night lights. Install grab bars by the toilet and in the tub and shower. Do not use towel bars as grab bars. Use non-skid mats or decals in the tub or shower. If you need to sit down in the shower, use a plastic, non-slip stool. Keep the floor dry. Clean up any water that spills on the floor as soon as it happens. Remove soap buildup in the tub or shower regularly. Attach bath mats securely with double-sided non-slip rug tape. Do not have throw rugs and other things on the floor that can make you trip. What can I do in the bedroom? Use  night lights. Make sure that you have a light by your bed that is easy to reach. Do not use any sheets or blankets that are too big for your bed. They should not hang down onto the floor. Have a firm chair that has side arms. You can use this for support while you get dressed. Do not have throw rugs and other things on the floor that can make you trip. What can I do in the kitchen? Clean up any spills right away. Avoid walking on wet floors. Keep items that you use a lot in easy-to-reach places. If you need to reach something above you, use a strong step stool that has a grab bar. Keep electrical cords out of the way. Do not use floor polish or wax that makes floors slippery. If you must use wax, use non-skid floor wax. Do not have throw rugs and other things on the floor that can make you trip. What can I do with my stairs? Do not leave any items on the stairs. Make sure that there are handrails on both sides of the stairs and use them. Fix handrails that are broken or loose. Make sure that handrails are as long as the stairways. Check any carpeting to make sure that it is firmly attached to the stairs. Fix any carpet that is loose or worn. Avoid having throw rugs at the top or bottom of the stairs. If you do have  throw rugs, attach them to the floor with carpet tape. Make sure that you have a light switch at the top of the stairs and the bottom of the stairs. If you do not have them, ask someone to add them for you. What else can I do to help prevent falls? Wear shoes that: Do not have high heels. Have rubber bottoms. Are comfortable and fit you well. Are closed at the toe. Do not wear sandals. If you use a stepladder: Make sure that it is fully opened. Do not climb a closed stepladder. Make sure that both sides of the stepladder are locked into place. Ask someone to hold it for you, if possible. Clearly mark and make sure that you can see: Any grab bars or handrails. First and last  steps. Where the edge of each step is. Use tools that help you move around (mobility aids) if they are needed. These include: Canes. Walkers. Scooters. Crutches. Turn on the lights when you go into a dark area. Replace any light bulbs as soon as they burn out. Set up your furniture so you have a clear path. Avoid moving your furniture around. If any of your floors are uneven, fix them. If there are any pets around you, be aware of where they are. Review your medicines with your doctor. Some medicines can make you feel dizzy. This can increase your chance of falling. Ask your doctor what other things that you can do to help prevent falls. This information is not intended to replace advice given to you by your health care provider. Make sure you discuss any questions you have with your health care provider. Document Released: 01/05/2009 Document Revised: 08/17/2015 Document Reviewed: 04/15/2014 Elsevier Interactive Patient Education  2017 [employer].

## 2021-08-28 NOTE — Progress Notes (Signed)
I connected with Darin [name] today by telephone and verified that I am speaking with the correct person using two identifiers. Location patient: home Location provider: work Persons participating in the virtual visit: Darin [name], Glenna Durand LPN.   I discussed the limitations, risks, security and privacy concerns of performing an evaluation and management service by telephone and the availability of in person appointments. I also discussed with the patient that there may be a patient responsible charge related to this service. The patient expressed understanding and verbally consented to this telephonic visit.    Interactive audio and video telecommunications were attempted between this provider and patient, however failed, due to patient having technical difficulties OR patient did not have access to video capability.  We continued and completed visit with audio only.     Vital signs may be patient reported or missing.  Subjective:   Darin [name] is a 88 y.o. male who presents for Medicare Annual/Subsequent preventive examination.  Review of Systems     Cardiac Risk Factors include: advanced age (>31mn, >>25women);dyslipidemia;hypertension;male gender     Objective:    Today's Vitals   08/28/21 1347  Weight: 170 lb (77.1 kg)  Height: '6\' 1"'$  (1.854 m)   Body mass index is 22.43 kg/m.     08/28/2021    1:54 PM 05/20/2021   12:59 PM 06/20/2020    8:17 AM 03/12/2018    8:47 AM 10/20/2017    9:17 AM 03/12/2017   10:34 AM 03/12/2017    7:29 AM  Advanced Directives  Does Patient Have a Medical Advance Directive? Yes Yes Yes Yes Yes Yes Yes  Type of AParamedicof AGreat Neck EstatesLiving will [place]Living will [place]Living will [place] will   [place] will;Out of facility DNR (pink MOST or yellow form)  Does patient want to make changes to medical advance  directive?    No - Patient declined  No - Patient declined   Copy of [place] Chart? Yes - validated most recent copy scanned in chart (See row information)  Yes - validated most recent copy scanned in chart (See row information) Yes - validated most recent copy scanned in chart (See row information)   No - copy requested    Current Medications (verified) Outpatient Encounter Medications as of 08/28/2021  Medication Sig   amLODipine (NORVASC) 5 MG tablet TAKE 1 TABLET DAILY (Patient taking differently: Take by mouth at bedtime.)   famotidine (PEPCID) 20 MG tablet TAKE 1 TABLET AT BEDTIME (Patient taking differently: Take 20 mg by mouth at bedtime.)   finasteride (PROSCAR) 5 MG tablet TAKE ONE TABLET EVERY DAY.   gabapentin (NEURONTIN) 100 MG capsule TAKE 1 CAPSULE 4 TIMES     DAILY (Patient taking differently: Take 100 mg by mouth 4 (four) times daily.)   magnesium oxide (MAG-OX) 400 MG tablet Take 400 mg by mouth daily.   polyethylene glycol (MIRALAX / GLYCOLAX) packet Take 17 g by mouth daily as needed for moderate constipation. (Patient taking differently: Take 17 g by mouth 2 (two) times daily.)   triamcinolone ointment (KENALOG) 0.1 % Apply 1 application topically 2 (two) times daily. To affected area (Patient taking differently: Apply 1 application. topically 2 (two) times daily as needed (rash/irritation).)   vitamin C (ASCORBIC ACID) 500 MG tablet Take 500 mg by mouth daily.   VITAMIN D PO Take 2,500 Units by mouth at bedtime.   enoxaparin (  LOVENOX) 40 MG/0.4ML injection Inject 0.4 mLs (40 mg total) into the skin daily for 21 days.   feeding supplement (ENSURE ENLIVE / ENSURE PLUS) LIQD Take 237 mLs by mouth 2 (two) times daily between meals.   No facility-administered encounter medications on file as of 08/28/2021.    Allergies (verified) Statins   History: Past Medical History:  Diagnosis Date   Arthritis    DUPUYTREN'S CONTRACTURE, RIGHT 07/04/2009    Annotation: 4th digit Qualifier: Diagnosis of  By: Arnoldo Morale MD, Balinda Quails    GERD (gastroesophageal reflux disease)    H/O hiatal hernia    "FOR YEARS" " NO PROBLEM"   Hyperlipidemia    Hypertension    Knee pain    Neuropathy    PERIPHERAL    Osteoporosis    PMR (polymyalgia rheumatica) (HCC)    Polymyalgia ([place]) 5 YEARS   Past Surgical History:  Procedure Laterality Date   CARPAL TUNNEL RELEASE     CATARACT EXTRACTION     right   COLONOSCOPY  2007   INGUINAL HERNIA REPAIR  10/14/2011   Procedure: HERNIA REPAIR INGUINAL ADULT;  Surgeon: [name], MD;  Location: [facility];  Service: General;  Laterality: Right;  Right Inguinal Hernia Repair with Mesh   TIBIA IM NAIL INSERTION Right 05/22/2021   Procedure: INTRAMEDULLARY (IM) NAIL TIBIAL;  Surgeon: Altamese [place], MD;  Location: [facility];  Service: Orthopedics;  Laterality: Right;   TONSILLECTOMY     VARICOSE VEIN SURGERY     Family History  Problem Relation Age of Onset   Heart disease Father    Coronary artery disease Other    Social History   Socioeconomic History   Marital status: Widowed    Spouse name: Not on file   Number of children: Not on file   Years of education: Not on file   Highest education level: Not on file  Occupational History   Occupation: retired    Fish farm manager: RETIRED  Tobacco Use   Smoking status: Never   Smokeless tobacco: Never  Vaping Use   Vaping Use: Never used  Substance and Sexual Activity   Alcohol use: Not Currently    Alcohol/week: 3.0 standard drinks    Types: 3 Glasses of wine per week    Comment: half glass wine before dinner   Drug use: No   Sexual activity: Not Currently  Other Topics Concern   Not on file  Social History Narrative   Not on file   Social Determinants of Health   Financial Resource Strain: Low Risk    Difficulty of Paying Living Expenses: Not hard at all  Food Insecurity: No Food Insecurity   Worried About Charity fundraiser in the Last Year: Never true    [place] in the Last Year: Never true  Transportation Needs: No Transportation Needs   Lack of Transportation (Medical): No   Lack of Transportation (Non-Medical): No  Physical Activity: Inactive   Days of Exercise per Week: 0 days   Minutes of Exercise per Session: 0 min  Stress: No Stress Concern Present   Feeling of Stress : Not at all  Social Connections: Not on file    Tobacco Counseling Counseling given: Not Answered   Clinical Intake:  Pre-visit preparation completed: Yes  Pain : No/denies pain     Nutritional Status: BMI of 19-24  Normal Nutritional Risks: None Diabetes: No  How often do you need to have someone help you when you read instructions,  pamphlets, or other written materials from your doctor or pharmacy?: 1 - Never  Diabetic? no  Interpreter Needed?: No  Information entered by :: NAllen LPN   Activities of Daily Living    08/28/2021    1:57 PM  In your present state of health, do you have any difficulty performing the following activities:  Hearing? 0  Vision? 0  Difficulty concentrating or making decisions? 0  Walking or climbing stairs? 1  Dressing or bathing? 1  Doing errands, shopping? 1  Preparing Food and eating ? N  Using the Toilet? N  In the past six months, have you accidently leaked urine? N  Do you have problems with loss of bowel control? N  Managing your Medications? N  Managing your Finances? N  Housekeeping or managing your Housekeeping? Y    Patient Care Team: [name], MD as PCP - General (Family Medicine) Tuchman, Leslye Peer, DPM (Inactive) as Consulting Physician (Podiatry) Viona Gilmore, [facility] as Pharmacist (Pharmacist)  Indicate any recent Medical Services you may have received from other than [facility] providers in the past year (date may be approximate).     Assessment:   This is a routine wellness examination for Darin [name].  Hearing/Vision screen Vision Screening - Comments:: Regular eye exams,  Dr. Talbert Forest  Dietary issues and exercise activities discussed: Current Exercise Habits: The patient does not participate in regular exercise at present   Goals Addressed             This Visit's Progress    Patient Stated       08/28/2021, wants to get back on the walker       Depression Screen    08/28/2021    1:56 PM 06/20/2020    8:16 AM 02/25/2020    8:36 AM 10/20/2017    9:24 AM 10/15/2016   11:30 AM 07/19/2016    3:19 PM 06/15/2015    9:19 AM  PHQ 2/9 Scores  PHQ - 2 Score 0 0 0 0 0 0 0    Fall Risk    08/28/2021    1:55 PM 08/13/2021   10:17 AM 06/20/2020    8:18 AM 02/25/2020    8:36 AM 10/20/2017    9:24 AM  Fall Risk   Falls in the past year? '1 1 1 '$ 0 Yes  Comment lost balance    due to GI Bleed   Number falls in past yr: 1 0 1  1  Injury with Fall? 1 1 0  Yes  Comment broke leg      Risk Factor Category      High Fall Risk  Risk for fall due to : Impaired mobility;Impaired balance/gait;Medication side effect  Impaired vision;Impaired balance/gait  Impaired balance/gait  Risk for fall due to: Comment   related to walker use    Follow up Falls evaluation completed;Education provided;Falls prevention discussed  Falls prevention discussed  Education provided    FALL RISK PREVENTION PERTAINING TO THE HOME:  Any stairs in or around the home? Yes  If so, are there any without handrails? No  Home free of loose throw rugs in walkways, pet beds, electrical cords, etc? Yes  Adequate lighting in your home to reduce risk of falls? Yes   ASSISTIVE DEVICES UTILIZED TO PREVENT FALLS:  Life alert? No  Use of a cane, walker or w/c? Yes  Grab bars in the bathroom? Yes  Shower chair or bench in shower? Yes  Elevated toilet seat or a handicapped  toilet? Yes   TIMED UP AND GO:  Was the test performed? No .      Cognitive Function:        08/28/2021    1:57 PM 06/20/2020    8:23 AM  6CIT Screen  What Year? 0 points 0 points  What month? 0 points 0 points  What time? 3  points   Count back from 20 0 points 0 points  Months in reverse 0 points 0 points  Repeat phrase 0 points 0 points  Total Score 3 points     Immunizations Immunization History  Administered Date(s) Administered   Fluad Quad(high Dose 65+) 01/10/2021   Hep A / Hep B 06/15/2012, 07/17/2012, 12/18/2012   Influenza Whole 03/26/2003, 01/12/2007   Influenza, High Dose Seasonal PF 01/27/2015, 12/21/2015   Influenza,inj,Quad PF,6+ Mos 03/08/2014   Influenza-Unspecified 12/22/2019   PFIZER(Purple Top)SARS-COV-2 Vaccination 04/02/2019, 04/23/2019   Pneumococcal Conjugate-13 11/16/2013   Pneumococcal Polysaccharide-23 12/12/2009   Td 03/26/2003   Tdap 06/15/2015    TDAP status: Up to date  Flu Vaccine status: Up to date  Pneumococcal vaccine status: Up to date  Covid-19 vaccine status: Completed vaccines  Qualifies for Shingles Vaccine? Yes   Zostavax completed No   Shingrix Completed?: No.    Education has been provided regarding the importance of this vaccine. Patient has been advised to call insurance company to determine out of pocket expense if they have not yet received this vaccine. Advised may also receive vaccine at local pharmacy or Health Dept. Verbalized acceptance and understanding.  Screening Tests Health Maintenance  Topic Date Due   COVID-19 Vaccine (3 - Pfizer risk series) 05/21/2019   Zoster Vaccines- Shingrix (1 of 2) 11/28/2021 (Originally 09/26/1950)   INFLUENZA VACCINE  10/23/2021   TETANUS/TDAP  06/14/2025   Pneumonia Vaccine 66+ Years old  Completed   HPV VACCINES  Aged Out    Health Maintenance  Health Maintenance Due  Topic Date Due   COVID-19 Vaccine (3 - Pfizer risk series) 05/21/2019    Colorectal cancer screening: No longer required.   Lung Cancer Screening: (Low Dose CT Chest recommended if Age 35-80 years, 30 pack-year currently smoking OR have quit w/in 15years.) does not qualify.   Lung Cancer Screening Referral: no  Additional  Screening:  Hepatitis C Screening: does not qualify;   Vision Screening: Recommended annual ophthalmology exams for early detection of glaucoma and other disorders of the eye. Is the patient up to date with their annual eye exam?  No  Who is the provider or what is the name of the office in which the patient attends annual eye exams? Dr. Talbert Forest If pt is not established with a provider, would they like to be referred to a provider to establish care? No .   Dental Screening: Recommended annual dental exams for proper oral hygiene  Community Resource Referral / Chronic Care Management: CRR required this visit?  No   CCM required this visit?  No      Plan:     I have personally reviewed and noted the following in the patient's chart:   Medical and social history Use of alcohol, tobacco or illicit drugs  Current medications and supplements including opioid prescriptions. Patient is not currently taking opioid prescriptions. Functional ability and status Nutritional status Physical activity Advanced directives List of other physicians Hospitalizations, surgeries, and ER visits in previous 12 months Vitals Screenings to include cognitive, depression, and falls Referrals and appointments  In addition, I have reviewed  and discussed with patient certain preventive protocols, quality metrics, and best practice recommendations. A written personalized care plan for preventive services as well as general preventive health recommendations were provided to patient.     Kellie Simmering, LPN   10/25/9935   Nurse Notes: none  Due to this being a virtual visit, the after visit summary with patients personalized plan was offered to patient via mail or my-chart. Patient would like to access on my-chart

## 2021-08-28 NOTE — Telephone Encounter (Signed)
This nurse attempted to call patient for AWV. I was unable to reach. Patient called back and we were able to do the visit.

## 2021-08-29 ENCOUNTER — Telehealth: Payer: Self-pay | Admitting: Family Medicine

## 2021-08-29 DIAGNOSIS — S82201D Unspecified fracture of shaft of right tibia, subsequent encounter for closed fracture with routine healing: Secondary | ICD-10-CM | POA: Diagnosis not present

## 2021-08-29 DIAGNOSIS — S82401D Unspecified fracture of shaft of right fibula, subsequent encounter for closed fracture with routine healing: Secondary | ICD-10-CM | POA: Diagnosis not present

## 2021-08-29 DIAGNOSIS — I10 Essential (primary) hypertension: Secondary | ICD-10-CM | POA: Diagnosis not present

## 2021-08-29 DIAGNOSIS — N400 Enlarged prostate without lower urinary tract symptoms: Secondary | ICD-10-CM | POA: Diagnosis not present

## 2021-08-29 DIAGNOSIS — K219 Gastro-esophageal reflux disease without esophagitis: Secondary | ICD-10-CM | POA: Diagnosis not present

## 2021-08-29 DIAGNOSIS — J449 Chronic obstructive pulmonary disease, unspecified: Secondary | ICD-10-CM | POA: Diagnosis not present

## 2021-08-29 NOTE — Telephone Encounter (Signed)
Radovan OT [facility] is calling and need OT verbal order starting today 1x3 and then 2x4

## 2021-08-29 NOTE — Telephone Encounter (Signed)
Left a detailed message informing Radovan of verbal orders

## 2021-08-30 DIAGNOSIS — N400 Enlarged prostate without lower urinary tract symptoms: Secondary | ICD-10-CM | POA: Diagnosis not present

## 2021-08-30 DIAGNOSIS — J449 Chronic obstructive pulmonary disease, unspecified: Secondary | ICD-10-CM | POA: Diagnosis not present

## 2021-08-30 DIAGNOSIS — S82401D Unspecified fracture of shaft of right fibula, subsequent encounter for closed fracture with routine healing: Secondary | ICD-10-CM | POA: Diagnosis not present

## 2021-08-30 DIAGNOSIS — I10 Essential (primary) hypertension: Secondary | ICD-10-CM | POA: Diagnosis not present

## 2021-08-30 DIAGNOSIS — S82201D Unspecified fracture of shaft of right tibia, subsequent encounter for closed fracture with routine healing: Secondary | ICD-10-CM | POA: Diagnosis not present

## 2021-08-30 DIAGNOSIS — K219 Gastro-esophageal reflux disease without esophagitis: Secondary | ICD-10-CM | POA: Diagnosis not present

## 2021-08-31 ENCOUNTER — Telehealth: Payer: Self-pay | Admitting: Family Medicine

## 2021-08-31 DIAGNOSIS — S82401D Unspecified fracture of shaft of right fibula, subsequent encounter for closed fracture with routine healing: Secondary | ICD-10-CM | POA: Diagnosis not present

## 2021-08-31 DIAGNOSIS — J449 Chronic obstructive pulmonary disease, unspecified: Secondary | ICD-10-CM | POA: Diagnosis not present

## 2021-08-31 DIAGNOSIS — I10 Essential (primary) hypertension: Secondary | ICD-10-CM | POA: Diagnosis not present

## 2021-08-31 DIAGNOSIS — S82201D Unspecified fracture of shaft of right tibia, subsequent encounter for closed fracture with routine healing: Secondary | ICD-10-CM | POA: Diagnosis not present

## 2021-08-31 DIAGNOSIS — N400 Enlarged prostate without lower urinary tract symptoms: Secondary | ICD-10-CM | POA: Diagnosis not present

## 2021-08-31 DIAGNOSIS — K219 Gastro-esophageal reflux disease without esophagitis: Secondary | ICD-10-CM | POA: Diagnosis not present

## 2021-08-31 NOTE — Telephone Encounter (Signed)
I spoke with Carolynne Edouard the pt's POA and informed her of the message and she verbalized understanding. Pt's POA inquired if pt should have Palliative care nurse available for the pt?

## 2021-08-31 NOTE — Telephone Encounter (Signed)
Left a message for the pt to return my call.  

## 2021-08-31 NOTE — Telephone Encounter (Signed)
Noted  

## 2021-08-31 NOTE — Telephone Encounter (Signed)
Galina from [facility] called to let Dr. Elease Hashimoto know that pt had a HR of 90 while sitting and 146 while standing. She was sent to Triage and the following is the notes from the call:  Breathing: SOB; sounds breathless Dizziness while standing.   Triage Nurse advise for them to call 911 Now however POA refused 911 and was unaware that [facility] had call pt's Dr office. Triage offered to call Galina back but POA stated she will take care of it.   POA called the office and spoke to [facility] and tried to see what appts pt has however Constance Holster was unable to release info because she wasn't on pt's DPR.   FYI.

## 2021-08-31 NOTE — Telephone Encounter (Signed)
Pt called back into the office and stated that "He is not going to the ER and is staying at home". I informed pt that was the recommendation by PCP but pt declined to visit ER.

## 2021-08-31 NOTE — Telephone Encounter (Signed)
[name] (pt [place] nurse)

## 2021-09-03 DIAGNOSIS — S82401D Unspecified fracture of shaft of right fibula, subsequent encounter for closed fracture with routine healing: Secondary | ICD-10-CM | POA: Diagnosis not present

## 2021-09-03 DIAGNOSIS — J449 Chronic obstructive pulmonary disease, unspecified: Secondary | ICD-10-CM | POA: Diagnosis not present

## 2021-09-03 DIAGNOSIS — S82201D Unspecified fracture of shaft of right tibia, subsequent encounter for closed fracture with routine healing: Secondary | ICD-10-CM | POA: Diagnosis not present

## 2021-09-03 DIAGNOSIS — K219 Gastro-esophageal reflux disease without esophagitis: Secondary | ICD-10-CM | POA: Diagnosis not present

## 2021-09-03 DIAGNOSIS — N400 Enlarged prostate without lower urinary tract symptoms: Secondary | ICD-10-CM | POA: Diagnosis not present

## 2021-09-03 DIAGNOSIS — I10 Essential (primary) hypertension: Secondary | ICD-10-CM | POA: Diagnosis not present

## 2021-09-04 ENCOUNTER — Telehealth: Payer: Self-pay | Admitting: Family Medicine

## 2021-09-04 ENCOUNTER — Telehealth (INDEPENDENT_AMBULATORY_CARE_PROVIDER_SITE_OTHER): Payer: Medicare Other | Admitting: Family Medicine

## 2021-09-04 ENCOUNTER — Encounter: Payer: Self-pay | Admitting: Family Medicine

## 2021-09-04 VITALS — BP 138/60 | Ht 73.0 in | Wt 170.0 lb

## 2021-09-04 DIAGNOSIS — R197 Diarrhea, unspecified: Secondary | ICD-10-CM | POA: Diagnosis not present

## 2021-09-04 DIAGNOSIS — S82201D Unspecified fracture of shaft of right tibia, subsequent encounter for closed fracture with routine healing: Secondary | ICD-10-CM | POA: Diagnosis not present

## 2021-09-04 DIAGNOSIS — J449 Chronic obstructive pulmonary disease, unspecified: Secondary | ICD-10-CM | POA: Diagnosis not present

## 2021-09-04 DIAGNOSIS — K219 Gastro-esophageal reflux disease without esophagitis: Secondary | ICD-10-CM | POA: Diagnosis not present

## 2021-09-04 DIAGNOSIS — I10 Essential (primary) hypertension: Secondary | ICD-10-CM | POA: Diagnosis not present

## 2021-09-04 DIAGNOSIS — N400 Enlarged prostate without lower urinary tract symptoms: Secondary | ICD-10-CM | POA: Diagnosis not present

## 2021-09-04 DIAGNOSIS — S82401D Unspecified fracture of shaft of right fibula, subsequent encounter for closed fracture with routine healing: Secondary | ICD-10-CM | POA: Diagnosis not present

## 2021-09-04 NOTE — Telephone Encounter (Signed)
Pt added to schedule for today. 

## 2021-09-04 NOTE — Telephone Encounter (Signed)
Pt has had diarrhea for several days, has not tried OTC treatments, requesting a prescription for an anti-diarrheal to  [place], [place] Phone:  [phone]  Fax:  [phone]

## 2021-09-04 NOTE — Progress Notes (Signed)
Patient ID: Darin [name], male   DOB: 11/07/1931, 88 y.o.   MRN: [id number]   Virtual Visit via Telephone Note  I connected with Darin [name] on 09/04/21 at  3:45 PM EDT by telephone and verified that I am speaking with the correct person using two identifiers.   I discussed the limitations, risks, security and privacy concerns of performing an evaluation and management service by telephone and the availability of in person appointments. I also discussed with the patient that there may be a patient responsible charge related to this service. The patient expressed understanding and agreed to proceed.  Location patient: home Location provider: work or home office Participants present for the call: patient, provider Patient did not have a visit in the prior 7 days to address this/these issue(s).   History of Present Illness: Patient relates 4-day history of diarrhea.  He states he has had loose to watery stools.  Usually going about 5 times per day.  No recent travels.  No recent antibiotics.  Denies any abdominal cramps or any nausea or vomiting.  No fever.  No new medications.  He does take MiraLAX at baseline and apparently has still been taking that.  Also taking magnesium oxide.  He is keeping down fluids.  No dizziness.  Past Medical History:  Diagnosis Date   Arthritis    DUPUYTREN'S CONTRACTURE, RIGHT 07/04/2009   Annotation: 4th digit Qualifier: Diagnosis of  By: Arnoldo Morale MD, Balinda Quails    GERD (gastroesophageal reflux disease)    H/O hiatal hernia    "FOR YEARS" " NO PROBLEM"   Hyperlipidemia    Hypertension    Knee pain    Neuropathy    PERIPHERAL    Osteoporosis    PMR (polymyalgia rheumatica) (HCC)    Polymyalgia ([place]) 5 YEARS   Past Surgical History:  Procedure Laterality Date   CARPAL TUNNEL RELEASE     CATARACT EXTRACTION     right   COLONOSCOPY  2007   INGUINAL HERNIA REPAIR  10/14/2011   Procedure: HERNIA REPAIR INGUINAL ADULT;  Surgeon: [name], MD;   Location: [place];  Service: General;  Laterality: Right;  Right Inguinal Hernia Repair with Mesh   TIBIA IM NAIL INSERTION Right 05/22/2021   Procedure: INTRAMEDULLARY (IM) NAIL TIBIAL;  Surgeon: Altamese [place], MD;  Location: [facility];  Service: Orthopedics;  Laterality: Right;   TONSILLECTOMY     VARICOSE VEIN SURGERY      reports that he has never smoked. He has never used smokeless tobacco. He reports that he does not currently use alcohol after a past usage of about 3.0 standard drinks of alcohol per week. He reports that he does not use drugs. family history includes Coronary artery disease in an other family member; Heart disease in his father. Allergies  Allergen Reactions   Statins Other (See Comments)    Muscle pain and upset stomach      Observations/Objective: Patient sounds cheerful and well on the phone. I do not appreciate any SOB. Speech and thought processing are grossly intact. Patient reported vitals:  Assessment and Plan:  Diarrhea of 4-day duration.  Etiology unclear.  No specific risk factors such as antibiotics or travel.  Denies any associated fever, bloody stools, etc. he is still taking magnesium oxide and MiraLAX which could be exacerbating  -Hold MiraLAX for now -Hold magnesium oxide for now -Discussed appropriate diet for diarrhea with bland foods and avoid high fat and high glycemic foods -Watch for any fever, worsening  of diarrhea, bloody stools -Would not recommend Imodium at this time -Be in touch if diarrhea not resolving in the next week  Follow Up Instructions:   99441 5-10 99442 11-20 99443 21-30 I did not refer this patient for an OV in the next 24 hours for this/these issue(s).  I discussed the assessment and treatment plan with the patient. The patient was provided an opportunity to ask questions and all were answered. The patient agreed with the plan and demonstrated an understanding of the instructions.   The patient was advised to call  back or seek an in-person evaluation if the symptoms worsen or if the condition fails to improve as anticipated.  I provided 22 minutes of non-face-to-face time during this encounter.   [name], MD

## 2021-09-10 ENCOUNTER — Telehealth: Payer: Self-pay | Admitting: Family Medicine

## 2021-09-10 ENCOUNTER — Other Ambulatory Visit: Payer: Self-pay

## 2021-09-10 MED ORDER — MELOXICAM 15 MG PO TABS: 15.0000 mg | ORAL_TABLET | Freq: Every day | ORAL | 0 refills | Status: DC

## 2021-09-10 NOTE — Telephone Encounter (Signed)
Pt called to get a Rx refill on Neloxicam '15MG'$  sent to [facility] (mail order). I let pt know that I don't see this medication in his med list and pt insisted that Dr. Elease Hashimoto prescribed him the med.   Please advise.

## 2021-09-10 NOTE — Telephone Encounter (Signed)
Rx sent and pt informed of the message

## 2021-09-11 DIAGNOSIS — I10 Essential (primary) hypertension: Secondary | ICD-10-CM | POA: Diagnosis not present

## 2021-09-11 DIAGNOSIS — J449 Chronic obstructive pulmonary disease, unspecified: Secondary | ICD-10-CM | POA: Diagnosis not present

## 2021-09-11 DIAGNOSIS — S82401D Unspecified fracture of shaft of right fibula, subsequent encounter for closed fracture with routine healing: Secondary | ICD-10-CM | POA: Diagnosis not present

## 2021-09-11 DIAGNOSIS — K219 Gastro-esophageal reflux disease without esophagitis: Secondary | ICD-10-CM | POA: Diagnosis not present

## 2021-09-11 DIAGNOSIS — N400 Enlarged prostate without lower urinary tract symptoms: Secondary | ICD-10-CM | POA: Diagnosis not present

## 2021-09-11 DIAGNOSIS — S82201D Unspecified fracture of shaft of right tibia, subsequent encounter for closed fracture with routine healing: Secondary | ICD-10-CM | POA: Diagnosis not present

## 2021-09-13 DIAGNOSIS — J449 Chronic obstructive pulmonary disease, unspecified: Secondary | ICD-10-CM | POA: Diagnosis not present

## 2021-09-13 DIAGNOSIS — N400 Enlarged prostate without lower urinary tract symptoms: Secondary | ICD-10-CM | POA: Diagnosis not present

## 2021-09-13 DIAGNOSIS — I10 Essential (primary) hypertension: Secondary | ICD-10-CM | POA: Diagnosis not present

## 2021-09-13 DIAGNOSIS — K219 Gastro-esophageal reflux disease without esophagitis: Secondary | ICD-10-CM | POA: Diagnosis not present

## 2021-09-13 DIAGNOSIS — S82401D Unspecified fracture of shaft of right fibula, subsequent encounter for closed fracture with routine healing: Secondary | ICD-10-CM | POA: Diagnosis not present

## 2021-09-13 DIAGNOSIS — S82201D Unspecified fracture of shaft of right tibia, subsequent encounter for closed fracture with routine healing: Secondary | ICD-10-CM | POA: Diagnosis not present

## 2021-09-14 DIAGNOSIS — S82201D Unspecified fracture of shaft of right tibia, subsequent encounter for closed fracture with routine healing: Secondary | ICD-10-CM | POA: Diagnosis not present

## 2021-09-14 DIAGNOSIS — I6523 Occlusion and stenosis of bilateral carotid arteries: Secondary | ICD-10-CM | POA: Diagnosis not present

## 2021-09-14 DIAGNOSIS — E871 Hypo-osmolality and hyponatremia: Secondary | ICD-10-CM | POA: Diagnosis not present

## 2021-09-14 DIAGNOSIS — M810 Age-related osteoporosis without current pathological fracture: Secondary | ICD-10-CM | POA: Diagnosis not present

## 2021-09-14 DIAGNOSIS — E785 Hyperlipidemia, unspecified: Secondary | ICD-10-CM | POA: Diagnosis not present

## 2021-09-14 DIAGNOSIS — J449 Chronic obstructive pulmonary disease, unspecified: Secondary | ICD-10-CM | POA: Diagnosis not present

## 2021-09-14 DIAGNOSIS — K219 Gastro-esophageal reflux disease without esophagitis: Secondary | ICD-10-CM | POA: Diagnosis not present

## 2021-09-14 DIAGNOSIS — G629 Polyneuropathy, unspecified: Secondary | ICD-10-CM | POA: Diagnosis not present

## 2021-09-14 DIAGNOSIS — K5731 Diverticulosis of large intestine without perforation or abscess with bleeding: Secondary | ICD-10-CM | POA: Diagnosis not present

## 2021-09-14 DIAGNOSIS — M720 Palmar fascial fibromatosis [Dupuytren]: Secondary | ICD-10-CM | POA: Diagnosis not present

## 2021-09-14 DIAGNOSIS — N400 Enlarged prostate without lower urinary tract symptoms: Secondary | ICD-10-CM | POA: Diagnosis not present

## 2021-09-14 DIAGNOSIS — I10 Essential (primary) hypertension: Secondary | ICD-10-CM | POA: Diagnosis not present

## 2021-09-14 DIAGNOSIS — M353 Polymyalgia rheumatica: Secondary | ICD-10-CM | POA: Diagnosis not present

## 2021-09-14 DIAGNOSIS — S82401D Unspecified fracture of shaft of right fibula, subsequent encounter for closed fracture with routine healing: Secondary | ICD-10-CM | POA: Diagnosis not present

## 2021-09-14 DIAGNOSIS — K449 Diaphragmatic hernia without obstruction or gangrene: Secondary | ICD-10-CM | POA: Diagnosis not present

## 2021-09-14 DIAGNOSIS — I824Z1 Acute embolism and thrombosis of unspecified deep veins of right distal lower extremity: Secondary | ICD-10-CM | POA: Diagnosis not present

## 2021-09-14 DIAGNOSIS — Z79891 Long term (current) use of opiate analgesic: Secondary | ICD-10-CM | POA: Diagnosis not present

## 2021-09-14 DIAGNOSIS — D638 Anemia in other chronic diseases classified elsewhere: Secondary | ICD-10-CM | POA: Diagnosis not present

## 2021-09-14 DIAGNOSIS — K5900 Constipation, unspecified: Secondary | ICD-10-CM | POA: Diagnosis not present

## 2021-09-14 DIAGNOSIS — L84 Corns and callosities: Secondary | ICD-10-CM | POA: Diagnosis not present

## 2021-09-14 DIAGNOSIS — M1990 Unspecified osteoarthritis, unspecified site: Secondary | ICD-10-CM | POA: Diagnosis not present

## 2021-09-14 DIAGNOSIS — Z9181 History of falling: Secondary | ICD-10-CM | POA: Diagnosis not present

## 2021-09-18 DIAGNOSIS — J449 Chronic obstructive pulmonary disease, unspecified: Secondary | ICD-10-CM | POA: Diagnosis not present

## 2021-09-18 DIAGNOSIS — K219 Gastro-esophageal reflux disease without esophagitis: Secondary | ICD-10-CM | POA: Diagnosis not present

## 2021-09-18 DIAGNOSIS — N400 Enlarged prostate without lower urinary tract symptoms: Secondary | ICD-10-CM | POA: Diagnosis not present

## 2021-09-18 DIAGNOSIS — I10 Essential (primary) hypertension: Secondary | ICD-10-CM | POA: Diagnosis not present

## 2021-09-18 DIAGNOSIS — S82201D Unspecified fracture of shaft of right tibia, subsequent encounter for closed fracture with routine healing: Secondary | ICD-10-CM | POA: Diagnosis not present

## 2021-09-18 DIAGNOSIS — S82401D Unspecified fracture of shaft of right fibula, subsequent encounter for closed fracture with routine healing: Secondary | ICD-10-CM | POA: Diagnosis not present

## 2021-09-19 DIAGNOSIS — S82401D Unspecified fracture of shaft of right fibula, subsequent encounter for closed fracture with routine healing: Secondary | ICD-10-CM | POA: Diagnosis not present

## 2021-09-19 DIAGNOSIS — N400 Enlarged prostate without lower urinary tract symptoms: Secondary | ICD-10-CM | POA: Diagnosis not present

## 2021-09-19 DIAGNOSIS — K219 Gastro-esophageal reflux disease without esophagitis: Secondary | ICD-10-CM | POA: Diagnosis not present

## 2021-09-19 DIAGNOSIS — I10 Essential (primary) hypertension: Secondary | ICD-10-CM | POA: Diagnosis not present

## 2021-09-19 DIAGNOSIS — J449 Chronic obstructive pulmonary disease, unspecified: Secondary | ICD-10-CM | POA: Diagnosis not present

## 2021-09-19 DIAGNOSIS — S82201D Unspecified fracture of shaft of right tibia, subsequent encounter for closed fracture with routine healing: Secondary | ICD-10-CM | POA: Diagnosis not present

## 2021-09-21 DIAGNOSIS — S82401D Unspecified fracture of shaft of right fibula, subsequent encounter for closed fracture with routine healing: Secondary | ICD-10-CM | POA: Diagnosis not present

## 2021-09-21 DIAGNOSIS — N400 Enlarged prostate without lower urinary tract symptoms: Secondary | ICD-10-CM | POA: Diagnosis not present

## 2021-09-21 DIAGNOSIS — I10 Essential (primary) hypertension: Secondary | ICD-10-CM | POA: Diagnosis not present

## 2021-09-21 DIAGNOSIS — J449 Chronic obstructive pulmonary disease, unspecified: Secondary | ICD-10-CM | POA: Diagnosis not present

## 2021-09-21 DIAGNOSIS — S82201D Unspecified fracture of shaft of right tibia, subsequent encounter for closed fracture with routine healing: Secondary | ICD-10-CM | POA: Diagnosis not present

## 2021-09-21 DIAGNOSIS — K219 Gastro-esophageal reflux disease without esophagitis: Secondary | ICD-10-CM | POA: Diagnosis not present

## 2021-09-25 DIAGNOSIS — S82201D Unspecified fracture of shaft of right tibia, subsequent encounter for closed fracture with routine healing: Secondary | ICD-10-CM | POA: Diagnosis not present

## 2021-09-25 DIAGNOSIS — S82401D Unspecified fracture of shaft of right fibula, subsequent encounter for closed fracture with routine healing: Secondary | ICD-10-CM | POA: Diagnosis not present

## 2021-09-25 DIAGNOSIS — I10 Essential (primary) hypertension: Secondary | ICD-10-CM | POA: Diagnosis not present

## 2021-09-25 DIAGNOSIS — K219 Gastro-esophageal reflux disease without esophagitis: Secondary | ICD-10-CM | POA: Diagnosis not present

## 2021-09-25 DIAGNOSIS — J449 Chronic obstructive pulmonary disease, unspecified: Secondary | ICD-10-CM | POA: Diagnosis not present

## 2021-09-25 DIAGNOSIS — N400 Enlarged prostate without lower urinary tract symptoms: Secondary | ICD-10-CM | POA: Diagnosis not present

## 2021-09-26 ENCOUNTER — Telehealth: Payer: Self-pay | Admitting: Family Medicine

## 2021-09-26 NOTE — Telephone Encounter (Signed)
Left a detailed message on Darin [name]'s voicemail asking if she is requesting office notes and/or an order for DME.

## 2021-09-26 NOTE — Telephone Encounter (Signed)
Requesting progress notes to assist with the fulfillment of a DME request for a wheelchair. Can be faxed to [phone] [facility]

## 2021-09-27 DIAGNOSIS — S82401D Unspecified fracture of shaft of right fibula, subsequent encounter for closed fracture with routine healing: Secondary | ICD-10-CM | POA: Diagnosis not present

## 2021-09-27 DIAGNOSIS — J449 Chronic obstructive pulmonary disease, unspecified: Secondary | ICD-10-CM | POA: Diagnosis not present

## 2021-09-27 DIAGNOSIS — K219 Gastro-esophageal reflux disease without esophagitis: Secondary | ICD-10-CM | POA: Diagnosis not present

## 2021-09-27 DIAGNOSIS — S82201D Unspecified fracture of shaft of right tibia, subsequent encounter for closed fracture with routine healing: Secondary | ICD-10-CM | POA: Diagnosis not present

## 2021-09-27 DIAGNOSIS — N400 Enlarged prostate without lower urinary tract symptoms: Secondary | ICD-10-CM | POA: Diagnosis not present

## 2021-09-27 DIAGNOSIS — I10 Essential (primary) hypertension: Secondary | ICD-10-CM | POA: Diagnosis not present

## 2021-10-01 DIAGNOSIS — J449 Chronic obstructive pulmonary disease, unspecified: Secondary | ICD-10-CM | POA: Diagnosis not present

## 2021-10-01 DIAGNOSIS — S82401D Unspecified fracture of shaft of right fibula, subsequent encounter for closed fracture with routine healing: Secondary | ICD-10-CM | POA: Diagnosis not present

## 2021-10-01 DIAGNOSIS — N400 Enlarged prostate without lower urinary tract symptoms: Secondary | ICD-10-CM | POA: Diagnosis not present

## 2021-10-01 DIAGNOSIS — I10 Essential (primary) hypertension: Secondary | ICD-10-CM | POA: Diagnosis not present

## 2021-10-01 DIAGNOSIS — K219 Gastro-esophageal reflux disease without esophagitis: Secondary | ICD-10-CM | POA: Diagnosis not present

## 2021-10-01 DIAGNOSIS — S82201D Unspecified fracture of shaft of right tibia, subsequent encounter for closed fracture with routine healing: Secondary | ICD-10-CM | POA: Diagnosis not present

## 2021-10-02 DIAGNOSIS — J449 Chronic obstructive pulmonary disease, unspecified: Secondary | ICD-10-CM | POA: Diagnosis not present

## 2021-10-02 DIAGNOSIS — S82201D Unspecified fracture of shaft of right tibia, subsequent encounter for closed fracture with routine healing: Secondary | ICD-10-CM | POA: Diagnosis not present

## 2021-10-02 DIAGNOSIS — S82401D Unspecified fracture of shaft of right fibula, subsequent encounter for closed fracture with routine healing: Secondary | ICD-10-CM | POA: Diagnosis not present

## 2021-10-02 DIAGNOSIS — N400 Enlarged prostate without lower urinary tract symptoms: Secondary | ICD-10-CM | POA: Diagnosis not present

## 2021-10-02 DIAGNOSIS — K219 Gastro-esophageal reflux disease without esophagitis: Secondary | ICD-10-CM | POA: Diagnosis not present

## 2021-10-02 DIAGNOSIS — I10 Essential (primary) hypertension: Secondary | ICD-10-CM | POA: Diagnosis not present

## 2021-10-05 DIAGNOSIS — N400 Enlarged prostate without lower urinary tract symptoms: Secondary | ICD-10-CM | POA: Diagnosis not present

## 2021-10-05 DIAGNOSIS — J449 Chronic obstructive pulmonary disease, unspecified: Secondary | ICD-10-CM | POA: Diagnosis not present

## 2021-10-05 DIAGNOSIS — S82201D Unspecified fracture of shaft of right tibia, subsequent encounter for closed fracture with routine healing: Secondary | ICD-10-CM | POA: Diagnosis not present

## 2021-10-05 DIAGNOSIS — K219 Gastro-esophageal reflux disease without esophagitis: Secondary | ICD-10-CM | POA: Diagnosis not present

## 2021-10-05 DIAGNOSIS — S82401D Unspecified fracture of shaft of right fibula, subsequent encounter for closed fracture with routine healing: Secondary | ICD-10-CM | POA: Diagnosis not present

## 2021-10-05 DIAGNOSIS — I10 Essential (primary) hypertension: Secondary | ICD-10-CM | POA: Diagnosis not present

## 2021-10-08 DIAGNOSIS — I10 Essential (primary) hypertension: Secondary | ICD-10-CM | POA: Diagnosis not present

## 2021-10-08 DIAGNOSIS — K219 Gastro-esophageal reflux disease without esophagitis: Secondary | ICD-10-CM | POA: Diagnosis not present

## 2021-10-08 DIAGNOSIS — S82201D Unspecified fracture of shaft of right tibia, subsequent encounter for closed fracture with routine healing: Secondary | ICD-10-CM | POA: Diagnosis not present

## 2021-10-08 DIAGNOSIS — N400 Enlarged prostate without lower urinary tract symptoms: Secondary | ICD-10-CM | POA: Diagnosis not present

## 2021-10-08 DIAGNOSIS — S82401D Unspecified fracture of shaft of right fibula, subsequent encounter for closed fracture with routine healing: Secondary | ICD-10-CM | POA: Diagnosis not present

## 2021-10-08 DIAGNOSIS — J449 Chronic obstructive pulmonary disease, unspecified: Secondary | ICD-10-CM | POA: Diagnosis not present

## 2021-10-08 NOTE — Telephone Encounter (Signed)
Pt is calling and would like office to call [facility] concerning wheel chair

## 2021-10-08 NOTE — Telephone Encounter (Signed)
I spoke with Darin [name] and she is faxing orders to be signed by physician.

## 2021-10-11 ENCOUNTER — Telehealth: Payer: Self-pay | Admitting: Family Medicine

## 2021-10-11 NOTE — Telephone Encounter (Signed)
Home health physical therapy 1x1 effective today

## 2021-10-12 ENCOUNTER — Telehealth: Payer: Self-pay | Admitting: Family Medicine

## 2021-10-12 DIAGNOSIS — I10 Essential (primary) hypertension: Secondary | ICD-10-CM | POA: Diagnosis not present

## 2021-10-12 DIAGNOSIS — S82401D Unspecified fracture of shaft of right fibula, subsequent encounter for closed fracture with routine healing: Secondary | ICD-10-CM | POA: Diagnosis not present

## 2021-10-12 DIAGNOSIS — J449 Chronic obstructive pulmonary disease, unspecified: Secondary | ICD-10-CM | POA: Diagnosis not present

## 2021-10-12 DIAGNOSIS — N400 Enlarged prostate without lower urinary tract symptoms: Secondary | ICD-10-CM | POA: Diagnosis not present

## 2021-10-12 DIAGNOSIS — S82201D Unspecified fracture of shaft of right tibia, subsequent encounter for closed fracture with routine healing: Secondary | ICD-10-CM | POA: Diagnosis not present

## 2021-10-12 DIAGNOSIS — K219 Gastro-esophageal reflux disease without esophagitis: Secondary | ICD-10-CM | POA: Diagnosis not present

## 2021-10-12 NOTE — Telephone Encounter (Signed)
[facility] , message given

## 2021-10-12 NOTE — Telephone Encounter (Signed)
Left detailed message on vm informing Radovan of verbal orders

## 2021-10-12 NOTE — Telephone Encounter (Addendum)
[facility] OT with [facility] is calling to extend OT orders for 2x7 starting on 10-15-2021. Ok to leave verbal ok on vm

## 2021-10-14 DIAGNOSIS — N400 Enlarged prostate without lower urinary tract symptoms: Secondary | ICD-10-CM | POA: Diagnosis not present

## 2021-10-14 DIAGNOSIS — M720 Palmar fascial fibromatosis [Dupuytren]: Secondary | ICD-10-CM | POA: Diagnosis not present

## 2021-10-14 DIAGNOSIS — K219 Gastro-esophageal reflux disease without esophagitis: Secondary | ICD-10-CM | POA: Diagnosis not present

## 2021-10-14 DIAGNOSIS — K5900 Constipation, unspecified: Secondary | ICD-10-CM | POA: Diagnosis not present

## 2021-10-14 DIAGNOSIS — I10 Essential (primary) hypertension: Secondary | ICD-10-CM | POA: Diagnosis not present

## 2021-10-14 DIAGNOSIS — M810 Age-related osteoporosis without current pathological fracture: Secondary | ICD-10-CM | POA: Diagnosis not present

## 2021-10-14 DIAGNOSIS — K5731 Diverticulosis of large intestine without perforation or abscess with bleeding: Secondary | ICD-10-CM | POA: Diagnosis not present

## 2021-10-14 DIAGNOSIS — E871 Hypo-osmolality and hyponatremia: Secondary | ICD-10-CM | POA: Diagnosis not present

## 2021-10-14 DIAGNOSIS — M1990 Unspecified osteoarthritis, unspecified site: Secondary | ICD-10-CM | POA: Diagnosis not present

## 2021-10-14 DIAGNOSIS — Z9181 History of falling: Secondary | ICD-10-CM | POA: Diagnosis not present

## 2021-10-14 DIAGNOSIS — K449 Diaphragmatic hernia without obstruction or gangrene: Secondary | ICD-10-CM | POA: Diagnosis not present

## 2021-10-14 DIAGNOSIS — Z86718 Personal history of other venous thrombosis and embolism: Secondary | ICD-10-CM | POA: Diagnosis not present

## 2021-10-14 DIAGNOSIS — S82401D Unspecified fracture of shaft of right fibula, subsequent encounter for closed fracture with routine healing: Secondary | ICD-10-CM | POA: Diagnosis not present

## 2021-10-14 DIAGNOSIS — L84 Corns and callosities: Secondary | ICD-10-CM | POA: Diagnosis not present

## 2021-10-14 DIAGNOSIS — I6523 Occlusion and stenosis of bilateral carotid arteries: Secondary | ICD-10-CM | POA: Diagnosis not present

## 2021-10-14 DIAGNOSIS — D638 Anemia in other chronic diseases classified elsewhere: Secondary | ICD-10-CM | POA: Diagnosis not present

## 2021-10-14 DIAGNOSIS — J449 Chronic obstructive pulmonary disease, unspecified: Secondary | ICD-10-CM | POA: Diagnosis not present

## 2021-10-14 DIAGNOSIS — M353 Polymyalgia rheumatica: Secondary | ICD-10-CM | POA: Diagnosis not present

## 2021-10-14 DIAGNOSIS — G629 Polyneuropathy, unspecified: Secondary | ICD-10-CM | POA: Diagnosis not present

## 2021-10-14 DIAGNOSIS — S82201D Unspecified fracture of shaft of right tibia, subsequent encounter for closed fracture with routine healing: Secondary | ICD-10-CM | POA: Diagnosis not present

## 2021-10-14 DIAGNOSIS — E785 Hyperlipidemia, unspecified: Secondary | ICD-10-CM | POA: Diagnosis not present

## 2021-10-15 DIAGNOSIS — N400 Enlarged prostate without lower urinary tract symptoms: Secondary | ICD-10-CM | POA: Diagnosis not present

## 2021-10-15 DIAGNOSIS — I10 Essential (primary) hypertension: Secondary | ICD-10-CM | POA: Diagnosis not present

## 2021-10-15 DIAGNOSIS — S82401D Unspecified fracture of shaft of right fibula, subsequent encounter for closed fracture with routine healing: Secondary | ICD-10-CM | POA: Diagnosis not present

## 2021-10-15 DIAGNOSIS — S82201D Unspecified fracture of shaft of right tibia, subsequent encounter for closed fracture with routine healing: Secondary | ICD-10-CM | POA: Diagnosis not present

## 2021-10-15 DIAGNOSIS — J449 Chronic obstructive pulmonary disease, unspecified: Secondary | ICD-10-CM | POA: Diagnosis not present

## 2021-10-15 DIAGNOSIS — K219 Gastro-esophageal reflux disease without esophagitis: Secondary | ICD-10-CM | POA: Diagnosis not present

## 2021-10-16 ENCOUNTER — Telehealth: Payer: Self-pay | Admitting: Family Medicine

## 2021-10-16 NOTE — Telephone Encounter (Signed)
Left detailed message informing Tish of verbal orders

## 2021-10-16 NOTE — Telephone Encounter (Addendum)
Tish with [facility]  Verbal Orders:  PT once a week for the next 6 weeks.  Please advise.  Ok to leave a detailed message at: [phone]

## 2021-10-17 DIAGNOSIS — S82231D Displaced oblique fracture of shaft of right tibia, subsequent encounter for closed fracture with routine healing: Secondary | ICD-10-CM | POA: Diagnosis not present

## 2021-10-18 DIAGNOSIS — K219 Gastro-esophageal reflux disease without esophagitis: Secondary | ICD-10-CM | POA: Diagnosis not present

## 2021-10-18 DIAGNOSIS — J449 Chronic obstructive pulmonary disease, unspecified: Secondary | ICD-10-CM | POA: Diagnosis not present

## 2021-10-18 DIAGNOSIS — S82401D Unspecified fracture of shaft of right fibula, subsequent encounter for closed fracture with routine healing: Secondary | ICD-10-CM | POA: Diagnosis not present

## 2021-10-18 DIAGNOSIS — N400 Enlarged prostate without lower urinary tract symptoms: Secondary | ICD-10-CM | POA: Diagnosis not present

## 2021-10-18 DIAGNOSIS — I10 Essential (primary) hypertension: Secondary | ICD-10-CM | POA: Diagnosis not present

## 2021-10-18 DIAGNOSIS — S82201D Unspecified fracture of shaft of right tibia, subsequent encounter for closed fracture with routine healing: Secondary | ICD-10-CM | POA: Diagnosis not present

## 2021-10-22 DIAGNOSIS — J449 Chronic obstructive pulmonary disease, unspecified: Secondary | ICD-10-CM | POA: Diagnosis not present

## 2021-10-22 DIAGNOSIS — K219 Gastro-esophageal reflux disease without esophagitis: Secondary | ICD-10-CM | POA: Diagnosis not present

## 2021-10-22 DIAGNOSIS — N400 Enlarged prostate without lower urinary tract symptoms: Secondary | ICD-10-CM | POA: Diagnosis not present

## 2021-10-22 DIAGNOSIS — I10 Essential (primary) hypertension: Secondary | ICD-10-CM | POA: Diagnosis not present

## 2021-10-22 DIAGNOSIS — S82201D Unspecified fracture of shaft of right tibia, subsequent encounter for closed fracture with routine healing: Secondary | ICD-10-CM | POA: Diagnosis not present

## 2021-10-22 DIAGNOSIS — S82401D Unspecified fracture of shaft of right fibula, subsequent encounter for closed fracture with routine healing: Secondary | ICD-10-CM | POA: Diagnosis not present

## 2021-10-23 DIAGNOSIS — J449 Chronic obstructive pulmonary disease, unspecified: Secondary | ICD-10-CM | POA: Diagnosis not present

## 2021-10-23 DIAGNOSIS — N400 Enlarged prostate without lower urinary tract symptoms: Secondary | ICD-10-CM | POA: Diagnosis not present

## 2021-10-23 DIAGNOSIS — S82401D Unspecified fracture of shaft of right fibula, subsequent encounter for closed fracture with routine healing: Secondary | ICD-10-CM | POA: Diagnosis not present

## 2021-10-23 DIAGNOSIS — I10 Essential (primary) hypertension: Secondary | ICD-10-CM | POA: Diagnosis not present

## 2021-10-23 DIAGNOSIS — K219 Gastro-esophageal reflux disease without esophagitis: Secondary | ICD-10-CM | POA: Diagnosis not present

## 2021-10-23 DIAGNOSIS — S82201D Unspecified fracture of shaft of right tibia, subsequent encounter for closed fracture with routine healing: Secondary | ICD-10-CM | POA: Diagnosis not present

## 2021-10-25 DIAGNOSIS — N400 Enlarged prostate without lower urinary tract symptoms: Secondary | ICD-10-CM | POA: Diagnosis not present

## 2021-10-25 DIAGNOSIS — S82201D Unspecified fracture of shaft of right tibia, subsequent encounter for closed fracture with routine healing: Secondary | ICD-10-CM | POA: Diagnosis not present

## 2021-10-25 DIAGNOSIS — J449 Chronic obstructive pulmonary disease, unspecified: Secondary | ICD-10-CM | POA: Diagnosis not present

## 2021-10-25 DIAGNOSIS — S82401D Unspecified fracture of shaft of right fibula, subsequent encounter for closed fracture with routine healing: Secondary | ICD-10-CM | POA: Diagnosis not present

## 2021-10-25 DIAGNOSIS — I10 Essential (primary) hypertension: Secondary | ICD-10-CM | POA: Diagnosis not present

## 2021-10-25 DIAGNOSIS — K219 Gastro-esophageal reflux disease without esophagitis: Secondary | ICD-10-CM | POA: Diagnosis not present

## 2021-10-26 DIAGNOSIS — S82401D Unspecified fracture of shaft of right fibula, subsequent encounter for closed fracture with routine healing: Secondary | ICD-10-CM | POA: Diagnosis not present

## 2021-10-26 DIAGNOSIS — K219 Gastro-esophageal reflux disease without esophagitis: Secondary | ICD-10-CM | POA: Diagnosis not present

## 2021-10-26 DIAGNOSIS — J449 Chronic obstructive pulmonary disease, unspecified: Secondary | ICD-10-CM | POA: Diagnosis not present

## 2021-10-26 DIAGNOSIS — I10 Essential (primary) hypertension: Secondary | ICD-10-CM | POA: Diagnosis not present

## 2021-10-26 DIAGNOSIS — N400 Enlarged prostate without lower urinary tract symptoms: Secondary | ICD-10-CM | POA: Diagnosis not present

## 2021-10-26 DIAGNOSIS — S82201D Unspecified fracture of shaft of right tibia, subsequent encounter for closed fracture with routine healing: Secondary | ICD-10-CM | POA: Diagnosis not present

## 2021-10-29 DIAGNOSIS — I10 Essential (primary) hypertension: Secondary | ICD-10-CM | POA: Diagnosis not present

## 2021-10-29 DIAGNOSIS — S82401D Unspecified fracture of shaft of right fibula, subsequent encounter for closed fracture with routine healing: Secondary | ICD-10-CM | POA: Diagnosis not present

## 2021-10-29 DIAGNOSIS — J449 Chronic obstructive pulmonary disease, unspecified: Secondary | ICD-10-CM | POA: Diagnosis not present

## 2021-10-29 DIAGNOSIS — N400 Enlarged prostate without lower urinary tract symptoms: Secondary | ICD-10-CM | POA: Diagnosis not present

## 2021-10-29 DIAGNOSIS — S82201D Unspecified fracture of shaft of right tibia, subsequent encounter for closed fracture with routine healing: Secondary | ICD-10-CM | POA: Diagnosis not present

## 2021-10-29 DIAGNOSIS — K219 Gastro-esophageal reflux disease without esophagitis: Secondary | ICD-10-CM | POA: Diagnosis not present

## 2021-10-30 DIAGNOSIS — S82201D Unspecified fracture of shaft of right tibia, subsequent encounter for closed fracture with routine healing: Secondary | ICD-10-CM | POA: Diagnosis not present

## 2021-10-30 DIAGNOSIS — S82401D Unspecified fracture of shaft of right fibula, subsequent encounter for closed fracture with routine healing: Secondary | ICD-10-CM | POA: Diagnosis not present

## 2021-10-30 DIAGNOSIS — N400 Enlarged prostate without lower urinary tract symptoms: Secondary | ICD-10-CM | POA: Diagnosis not present

## 2021-10-30 DIAGNOSIS — I10 Essential (primary) hypertension: Secondary | ICD-10-CM | POA: Diagnosis not present

## 2021-10-30 DIAGNOSIS — J449 Chronic obstructive pulmonary disease, unspecified: Secondary | ICD-10-CM | POA: Diagnosis not present

## 2021-10-30 DIAGNOSIS — K219 Gastro-esophageal reflux disease without esophagitis: Secondary | ICD-10-CM | POA: Diagnosis not present

## 2021-10-31 ENCOUNTER — Telehealth: Payer: Self-pay | Admitting: Pharmacist

## 2021-10-31 NOTE — Chronic Care Management (AMB) (Signed)
Chronic Care Management Pharmacy Assistant   Name: Darin [name]  MRN: [id number] DOB: [birth year]  Reason for Encounter: Disease State   Conditions to be addressed/monitored: HTN  Recent office visits:  09/04/21  [name], MD - Patient presented via video for Diarrhea unspecified type. No medication changes.  08/28/21 Kellie Simmering, LPN - Patient presented for Medicare Annual Wellness exam. No medication changes. Pt voiced goal of using walker.  05/14/21 Burchette, Alinda Sierras, MD - Patient presented for Frequent nosebleeds. No medication changes.  Recent consult visits:  05/22/21 Altamese Miesville MD (Orthopedic Surg) - Patient presented for surgery of Right INTRAMEDULLARY (IM) NAIL TIBIAL  04/27/21 Gardiner Barefoot, DPM (Podiatry) - Patient presented for Pain due to onychomycosis of toenails of both feet and other concerns. No medication changes.  04/24/21 Lyndal Pulley, DO (Sports Med) - Patient presented for Trigger finger left ring finger and other concerns. Injection  Administered. No medication changes.   Hospital visits:  Medication Reconciliation was completed by comparing discharge summary, patient's EMR and Pharmacy list, and upon discussion with patient.  Patient presented to [facility]  on 05/20/21 due to Tibia/fibula fracture. Patient was present for 4 days.  New?Medications Started at [facility] Discharge:?? -started  Lovenox Feeding Supplement Oxycodone  Medication Changes at Hospital Discharge: -Changed  none  Medications Discontinued at Hospital Discharge: -Stopped  Lotensin HCT  Medications that remain the same after Hospital Discharge:??  -All other medications will remain the same.    Medications: Outpatient Encounter Medications as of 10/31/2021  Medication Sig   amLODipine (NORVASC) 5 MG tablet TAKE 1 TABLET DAILY (Patient taking differently: Take by mouth at bedtime.)   enoxaparin (LOVENOX) 40 MG/0.4ML injection Inject 0.4 mLs  (40 mg total) into the skin daily for 21 days.   famotidine (PEPCID) 20 MG tablet TAKE 1 TABLET AT BEDTIME (Patient taking differently: Take 20 mg by mouth at bedtime.)   feeding supplement (ENSURE ENLIVE / ENSURE PLUS) LIQD Take 237 mLs by mouth 2 (two) times daily between meals.   finasteride (PROSCAR) 5 MG tablet TAKE ONE TABLET EVERY DAY.   gabapentin (NEURONTIN) 100 MG capsule TAKE 1 CAPSULE 4 TIMES     DAILY (Patient taking differently: Take 100 mg by mouth 4 (four) times daily.)   magnesium oxide (MAG-OX) 400 MG tablet Take 400 mg by mouth daily.   meloxicam (MOBIC) 15 MG tablet Take 1 tablet (15 mg total) by mouth daily.   polyethylene glycol (MIRALAX / GLYCOLAX) packet Take 17 g by mouth daily as needed for moderate constipation. (Patient taking differently: Take 17 g by mouth 2 (two) times daily.)   triamcinolone ointment (KENALOG) 0.1 % Apply 1 application topically 2 (two) times daily. To affected area (Patient taking differently: Apply 1 application  topically 2 (two) times daily as needed (rash/irritation).)   vitamin C (ASCORBIC ACID) 500 MG tablet Take 500 mg by mouth daily.   VITAMIN D PO Take 2,500 Units by mouth at bedtime.   No facility-administered encounter medications on file as of 10/31/2021.   Reviewed chart prior to disease state call. Spoke with patient regarding BP  Recent Office Vitals: BP Readings from Last 3 Encounters:  09/04/21 138/60  05/24/21 (!) 133/59  04/24/21 124/60   Pulse Readings from Last 3 Encounters:  05/24/21 82  04/24/21 87  03/13/21 82    Wt Readings from Last 3 Encounters:  09/04/21 170 lb (77.1 kg)  08/28/21 170 lb (77.1 kg)  05/22/21 200  lb 3.2 oz (90.8 kg)     Kidney Function Lab Results  Component Value Date/Time   CREATININE 0.79 05/24/2021 02:58 AM   CREATININE 0.68 05/23/2021 04:25 AM   CREATININE 0.73 02/25/2020 09:00 AM   GFR 102.17 04/08/2018 08:46 AM   GFRNONAA >60 05/24/2021 02:58 AM   GFRAA >60 03/16/2017 05:23 AM        Latest Ref Rng & Units 05/24/2021    2:58 AM 05/23/2021    4:25 AM 05/21/2021    3:06 AM  BMP  Glucose 70 - 99 mg/dL 118  129  132   BUN 8 - 23 mg/dL '18  10  17   '$ Creatinine 0.61 - 1.24 mg/dL 0.79  0.68  0.86   Sodium 135 - 145 mmol/L 137  134  136   Potassium 3.5 - 5.1 mmol/L 3.6  4.4  5.0   Chloride 98 - 111 mmol/L 103  100  100   CO2 22 - 32 mmol/L '26  28  30   '$ Calcium 8.9 - 10.3 mg/dL 8.6  8.9  9.5     Current antihypertensive regimen:  Amlodipine 5 mg 1 tablet daily - appropriate, effective, safe, accessible  How often are you checking your Blood Pressure? Patient reports he is not checking and no longer has a blood pressure cuff. He denies any hyper/hypotensive symptoms. Current home BP readings:  BP Readings from Last 3 Encounters:  09/04/21 138/60  05/24/21 (!) 133/59  04/24/21 124/60   What recent interventions/DTPs have been made by any provider to improve Blood Pressure control since last CPP Visit: Patient is no longer on Benazepril-HCTZ 10-12.5 mg 1 tablet daily - appropriate, effective, safe, accessible Any recent hospitalizations or ED visits since last visit with CPP? Yes Notes: Patient reports he is managing as best he can with his activity level and Physical Therapy is not in pain but is not easy for him.  Patient reports he has been in communication with PCP/Lawyer to have DNR Documentation sent to him to have at home, but has yet received anything.  8/11/23Per MP PCP will complete paperwork to have sent to the patient, left patient a voicemail to advise   Adherence Review: Is the patient currently on ACE/ARB medication? No Does the patient have >5 day gap between last estimated fill dates? No    Care Gaps: COVID Booster - Overdue Flu Vaccine - Overdue Zoster Vaccine - Postponed CCM- 11/23 AWV- 6/23 BP- 138/60 09/04/2021  Star Rating Drugs: None    Ned Clines [place] Clinical Pharmacist Assistant [phone]

## 2021-11-01 DIAGNOSIS — K219 Gastro-esophageal reflux disease without esophagitis: Secondary | ICD-10-CM | POA: Diagnosis not present

## 2021-11-01 DIAGNOSIS — J449 Chronic obstructive pulmonary disease, unspecified: Secondary | ICD-10-CM | POA: Diagnosis not present

## 2021-11-01 DIAGNOSIS — S82201D Unspecified fracture of shaft of right tibia, subsequent encounter for closed fracture with routine healing: Secondary | ICD-10-CM | POA: Diagnosis not present

## 2021-11-01 DIAGNOSIS — N400 Enlarged prostate without lower urinary tract symptoms: Secondary | ICD-10-CM | POA: Diagnosis not present

## 2021-11-01 DIAGNOSIS — I10 Essential (primary) hypertension: Secondary | ICD-10-CM | POA: Diagnosis not present

## 2021-11-01 DIAGNOSIS — S82401D Unspecified fracture of shaft of right fibula, subsequent encounter for closed fracture with routine healing: Secondary | ICD-10-CM | POA: Diagnosis not present

## 2021-11-02 DIAGNOSIS — J449 Chronic obstructive pulmonary disease, unspecified: Secondary | ICD-10-CM | POA: Diagnosis not present

## 2021-11-02 DIAGNOSIS — I10 Essential (primary) hypertension: Secondary | ICD-10-CM | POA: Diagnosis not present

## 2021-11-02 DIAGNOSIS — S82401D Unspecified fracture of shaft of right fibula, subsequent encounter for closed fracture with routine healing: Secondary | ICD-10-CM | POA: Diagnosis not present

## 2021-11-02 DIAGNOSIS — N400 Enlarged prostate without lower urinary tract symptoms: Secondary | ICD-10-CM | POA: Diagnosis not present

## 2021-11-02 DIAGNOSIS — S82201D Unspecified fracture of shaft of right tibia, subsequent encounter for closed fracture with routine healing: Secondary | ICD-10-CM | POA: Diagnosis not present

## 2021-11-02 DIAGNOSIS — K219 Gastro-esophageal reflux disease without esophagitis: Secondary | ICD-10-CM | POA: Diagnosis not present

## 2021-11-06 DIAGNOSIS — I10 Essential (primary) hypertension: Secondary | ICD-10-CM | POA: Diagnosis not present

## 2021-11-06 DIAGNOSIS — N400 Enlarged prostate without lower urinary tract symptoms: Secondary | ICD-10-CM | POA: Diagnosis not present

## 2021-11-06 DIAGNOSIS — J449 Chronic obstructive pulmonary disease, unspecified: Secondary | ICD-10-CM | POA: Diagnosis not present

## 2021-11-06 DIAGNOSIS — S82201D Unspecified fracture of shaft of right tibia, subsequent encounter for closed fracture with routine healing: Secondary | ICD-10-CM | POA: Diagnosis not present

## 2021-11-06 DIAGNOSIS — S82401D Unspecified fracture of shaft of right fibula, subsequent encounter for closed fracture with routine healing: Secondary | ICD-10-CM | POA: Diagnosis not present

## 2021-11-06 DIAGNOSIS — K219 Gastro-esophageal reflux disease without esophagitis: Secondary | ICD-10-CM | POA: Diagnosis not present

## 2021-11-07 ENCOUNTER — Other Ambulatory Visit: Payer: Self-pay | Admitting: Family Medicine

## 2021-11-07 DIAGNOSIS — S82401D Unspecified fracture of shaft of right fibula, subsequent encounter for closed fracture with routine healing: Secondary | ICD-10-CM | POA: Diagnosis not present

## 2021-11-07 DIAGNOSIS — N400 Enlarged prostate without lower urinary tract symptoms: Secondary | ICD-10-CM | POA: Diagnosis not present

## 2021-11-07 DIAGNOSIS — K219 Gastro-esophageal reflux disease without esophagitis: Secondary | ICD-10-CM | POA: Diagnosis not present

## 2021-11-07 DIAGNOSIS — J449 Chronic obstructive pulmonary disease, unspecified: Secondary | ICD-10-CM | POA: Diagnosis not present

## 2021-11-07 DIAGNOSIS — I10 Essential (primary) hypertension: Secondary | ICD-10-CM | POA: Diagnosis not present

## 2021-11-07 DIAGNOSIS — S82201D Unspecified fracture of shaft of right tibia, subsequent encounter for closed fracture with routine healing: Secondary | ICD-10-CM | POA: Diagnosis not present

## 2021-11-07 NOTE — Telephone Encounter (Signed)
Last refill- medication not on med list was discontinued upon hospital discharge on 05/24/21  Last VV- 09/04/21  No future OV scheduled.  Can this patient receive a refill?

## 2021-11-08 DIAGNOSIS — N400 Enlarged prostate without lower urinary tract symptoms: Secondary | ICD-10-CM | POA: Diagnosis not present

## 2021-11-08 DIAGNOSIS — I10 Essential (primary) hypertension: Secondary | ICD-10-CM | POA: Diagnosis not present

## 2021-11-08 DIAGNOSIS — S82201D Unspecified fracture of shaft of right tibia, subsequent encounter for closed fracture with routine healing: Secondary | ICD-10-CM | POA: Diagnosis not present

## 2021-11-08 DIAGNOSIS — S82401D Unspecified fracture of shaft of right fibula, subsequent encounter for closed fracture with routine healing: Secondary | ICD-10-CM | POA: Diagnosis not present

## 2021-11-08 DIAGNOSIS — J449 Chronic obstructive pulmonary disease, unspecified: Secondary | ICD-10-CM | POA: Diagnosis not present

## 2021-11-08 DIAGNOSIS — K219 Gastro-esophageal reflux disease without esophagitis: Secondary | ICD-10-CM | POA: Diagnosis not present

## 2021-11-09 DIAGNOSIS — S82201D Unspecified fracture of shaft of right tibia, subsequent encounter for closed fracture with routine healing: Secondary | ICD-10-CM | POA: Diagnosis not present

## 2021-11-09 DIAGNOSIS — J449 Chronic obstructive pulmonary disease, unspecified: Secondary | ICD-10-CM | POA: Diagnosis not present

## 2021-11-09 DIAGNOSIS — I10 Essential (primary) hypertension: Secondary | ICD-10-CM | POA: Diagnosis not present

## 2021-11-09 DIAGNOSIS — S82401D Unspecified fracture of shaft of right fibula, subsequent encounter for closed fracture with routine healing: Secondary | ICD-10-CM | POA: Diagnosis not present

## 2021-11-09 DIAGNOSIS — N400 Enlarged prostate without lower urinary tract symptoms: Secondary | ICD-10-CM | POA: Diagnosis not present

## 2021-11-09 DIAGNOSIS — K219 Gastro-esophageal reflux disease without esophagitis: Secondary | ICD-10-CM | POA: Diagnosis not present

## 2021-11-09 NOTE — Telephone Encounter (Signed)
Called patient at both numbers listed only vm, will contact patient at later time.

## 2021-11-09 NOTE — Telephone Encounter (Signed)
Spoke with patient, yes he currently takes medication.  Please advise if okay to send for refills.

## 2021-11-12 DIAGNOSIS — K219 Gastro-esophageal reflux disease without esophagitis: Secondary | ICD-10-CM | POA: Diagnosis not present

## 2021-11-12 DIAGNOSIS — S82401D Unspecified fracture of shaft of right fibula, subsequent encounter for closed fracture with routine healing: Secondary | ICD-10-CM | POA: Diagnosis not present

## 2021-11-12 DIAGNOSIS — I10 Essential (primary) hypertension: Secondary | ICD-10-CM | POA: Diagnosis not present

## 2021-11-12 DIAGNOSIS — S82201D Unspecified fracture of shaft of right tibia, subsequent encounter for closed fracture with routine healing: Secondary | ICD-10-CM | POA: Diagnosis not present

## 2021-11-12 DIAGNOSIS — N400 Enlarged prostate without lower urinary tract symptoms: Secondary | ICD-10-CM | POA: Diagnosis not present

## 2021-11-12 DIAGNOSIS — J449 Chronic obstructive pulmonary disease, unspecified: Secondary | ICD-10-CM | POA: Diagnosis not present

## 2021-11-13 DIAGNOSIS — D638 Anemia in other chronic diseases classified elsewhere: Secondary | ICD-10-CM | POA: Diagnosis not present

## 2021-11-13 DIAGNOSIS — S82201D Unspecified fracture of shaft of right tibia, subsequent encounter for closed fracture with routine healing: Secondary | ICD-10-CM | POA: Diagnosis not present

## 2021-11-13 DIAGNOSIS — K219 Gastro-esophageal reflux disease without esophagitis: Secondary | ICD-10-CM | POA: Diagnosis not present

## 2021-11-13 DIAGNOSIS — J449 Chronic obstructive pulmonary disease, unspecified: Secondary | ICD-10-CM | POA: Diagnosis not present

## 2021-11-13 DIAGNOSIS — I10 Essential (primary) hypertension: Secondary | ICD-10-CM | POA: Diagnosis not present

## 2021-11-13 DIAGNOSIS — E785 Hyperlipidemia, unspecified: Secondary | ICD-10-CM | POA: Diagnosis not present

## 2021-11-13 DIAGNOSIS — K5900 Constipation, unspecified: Secondary | ICD-10-CM | POA: Diagnosis not present

## 2021-11-13 DIAGNOSIS — K5731 Diverticulosis of large intestine without perforation or abscess with bleeding: Secondary | ICD-10-CM | POA: Diagnosis not present

## 2021-11-13 DIAGNOSIS — I6523 Occlusion and stenosis of bilateral carotid arteries: Secondary | ICD-10-CM | POA: Diagnosis not present

## 2021-11-13 DIAGNOSIS — M720 Palmar fascial fibromatosis [Dupuytren]: Secondary | ICD-10-CM | POA: Diagnosis not present

## 2021-11-13 DIAGNOSIS — E871 Hypo-osmolality and hyponatremia: Secondary | ICD-10-CM | POA: Diagnosis not present

## 2021-11-13 DIAGNOSIS — L84 Corns and callosities: Secondary | ICD-10-CM | POA: Diagnosis not present

## 2021-11-13 DIAGNOSIS — M1990 Unspecified osteoarthritis, unspecified site: Secondary | ICD-10-CM | POA: Diagnosis not present

## 2021-11-13 DIAGNOSIS — M810 Age-related osteoporosis without current pathological fracture: Secondary | ICD-10-CM | POA: Diagnosis not present

## 2021-11-13 DIAGNOSIS — M353 Polymyalgia rheumatica: Secondary | ICD-10-CM | POA: Diagnosis not present

## 2021-11-13 DIAGNOSIS — K449 Diaphragmatic hernia without obstruction or gangrene: Secondary | ICD-10-CM | POA: Diagnosis not present

## 2021-11-13 DIAGNOSIS — S82401D Unspecified fracture of shaft of right fibula, subsequent encounter for closed fracture with routine healing: Secondary | ICD-10-CM | POA: Diagnosis not present

## 2021-11-13 DIAGNOSIS — Z9181 History of falling: Secondary | ICD-10-CM | POA: Diagnosis not present

## 2021-11-13 DIAGNOSIS — Z86718 Personal history of other venous thrombosis and embolism: Secondary | ICD-10-CM | POA: Diagnosis not present

## 2021-11-13 DIAGNOSIS — N400 Enlarged prostate without lower urinary tract symptoms: Secondary | ICD-10-CM | POA: Diagnosis not present

## 2021-11-13 DIAGNOSIS — G629 Polyneuropathy, unspecified: Secondary | ICD-10-CM | POA: Diagnosis not present

## 2021-11-14 ENCOUNTER — Telehealth: Payer: Self-pay | Admitting: Family Medicine

## 2021-11-14 ENCOUNTER — Telehealth (HOSPITAL_BASED_OUTPATIENT_CLINIC_OR_DEPARTMENT_OTHER): Payer: Self-pay | Admitting: *Deleted

## 2021-11-14 DIAGNOSIS — M7989 Other specified soft tissue disorders: Secondary | ICD-10-CM

## 2021-11-14 DIAGNOSIS — N400 Enlarged prostate without lower urinary tract symptoms: Secondary | ICD-10-CM | POA: Diagnosis not present

## 2021-11-14 DIAGNOSIS — I10 Essential (primary) hypertension: Secondary | ICD-10-CM | POA: Diagnosis not present

## 2021-11-14 DIAGNOSIS — M79661 Pain in right lower leg: Secondary | ICD-10-CM

## 2021-11-14 DIAGNOSIS — S82401D Unspecified fracture of shaft of right fibula, subsequent encounter for closed fracture with routine healing: Secondary | ICD-10-CM | POA: Diagnosis not present

## 2021-11-14 DIAGNOSIS — K219 Gastro-esophageal reflux disease without esophagitis: Secondary | ICD-10-CM | POA: Diagnosis not present

## 2021-11-14 DIAGNOSIS — S82201D Unspecified fracture of shaft of right tibia, subsequent encounter for closed fracture with routine healing: Secondary | ICD-10-CM | POA: Diagnosis not present

## 2021-11-14 DIAGNOSIS — J449 Chronic obstructive pulmonary disease, unspecified: Secondary | ICD-10-CM | POA: Diagnosis not present

## 2021-11-14 NOTE — Telephone Encounter (Signed)
I spoke with the patient and he stated he is unable to come into the office. Doppler has been ordered

## 2021-11-14 NOTE — Addendum Note (Signed)
Addended by: Nilda Riggs on: 11/14/2021 12:58 PM   Modules accepted: Orders

## 2021-11-14 NOTE — Telephone Encounter (Signed)
Patient called into the office and requested that Doppler be performed at his house. I informed the patient that I was not aware this could be done and would check on this. After contacting [place] at [facility] she provided me with a fax number to [facility] who can perform an at home doppler. Orders have been faxed as STAT and patient is aware of this.

## 2021-11-14 NOTE — Addendum Note (Signed)
Addended by: Nilda Riggs on: 11/14/2021 01:16 PM   Modules accepted: Orders

## 2021-11-14 NOTE — Telephone Encounter (Signed)
he states he is in a wheelchair and not able to come out.  Someone will have to come to his home to do the study. (VAS [place] LOWER EXTREMITY VENOUS (DVT)

## 2021-11-14 NOTE — Telephone Encounter (Signed)
Please see prior note. Orders have been faxed to [facility] who can perform an at home doppler. Orders have been faxed as STAT and patient is aware of this.

## 2021-11-14 NOTE — Telephone Encounter (Signed)
Spoke with patient to schedule the lower extremity venous doppler ordered by Dr. Elease Hashimoto.  Patient states he is in a wheelchair and cannot come out of the house.  Someone will have to come to his home to do the study.  I called Dr. Erick Blinks office and informed them of the situation.

## 2021-11-14 NOTE — Telephone Encounter (Signed)
Brooke Dare PTA with [facility] is calling the pt right leg is swollen with pain and she would like order for doppler to rule out DVT  please  fax to [facility] [phone]

## 2021-11-16 ENCOUNTER — Telehealth: Payer: Self-pay | Admitting: Family Medicine

## 2021-11-16 DIAGNOSIS — R601 Generalized edema: Secondary | ICD-10-CM | POA: Diagnosis not present

## 2021-11-16 NOTE — Telephone Encounter (Signed)
Pt is calling to report [facility] guy told him he does not have blood clot but recommend he has arterial doppler. They will fax the results

## 2021-11-16 NOTE — Telephone Encounter (Signed)
Patient information faxed

## 2021-11-16 NOTE — Telephone Encounter (Signed)
Pt just wanted to make sure order was faxed to the correct fax number:   [phone]

## 2021-11-16 NOTE — Telephone Encounter (Signed)
Duplicate: Please disregard 

## 2021-11-16 NOTE — Telephone Encounter (Signed)
Darin [name] from [employer] requesting face sheet and insurance information for this procedure and is providing a different fax # [phone]. Darin [name] [phone] with any questions

## 2021-11-16 NOTE — Telephone Encounter (Signed)
Please see message below

## 2021-11-19 ENCOUNTER — Telehealth: Payer: Self-pay | Admitting: Family Medicine

## 2021-11-19 ENCOUNTER — Other Ambulatory Visit: Payer: Self-pay | Admitting: Family Medicine

## 2021-11-19 DIAGNOSIS — S82401D Unspecified fracture of shaft of right fibula, subsequent encounter for closed fracture with routine healing: Secondary | ICD-10-CM | POA: Diagnosis not present

## 2021-11-19 DIAGNOSIS — N400 Enlarged prostate without lower urinary tract symptoms: Secondary | ICD-10-CM | POA: Diagnosis not present

## 2021-11-19 DIAGNOSIS — S82201D Unspecified fracture of shaft of right tibia, subsequent encounter for closed fracture with routine healing: Secondary | ICD-10-CM | POA: Diagnosis not present

## 2021-11-19 DIAGNOSIS — K219 Gastro-esophageal reflux disease without esophagitis: Secondary | ICD-10-CM | POA: Diagnosis not present

## 2021-11-19 DIAGNOSIS — I10 Essential (primary) hypertension: Secondary | ICD-10-CM | POA: Diagnosis not present

## 2021-11-19 DIAGNOSIS — J449 Chronic obstructive pulmonary disease, unspecified: Secondary | ICD-10-CM | POA: Diagnosis not present

## 2021-11-19 NOTE — Telephone Encounter (Signed)
Duplicate: Please disregard 

## 2021-11-19 NOTE — Telephone Encounter (Signed)
Pt called, returning CMA's call. CMA was unavailable. Pt was asked if CMA could call him back. Pt stated that he would call CMA back later.

## 2021-11-19 NOTE — Telephone Encounter (Signed)
Left a message for the patient to return my call.  

## 2021-11-19 NOTE — Telephone Encounter (Signed)
I spoke with the patient and OV scheduled for 11/21/2021 with PCP

## 2021-11-20 DIAGNOSIS — N400 Enlarged prostate without lower urinary tract symptoms: Secondary | ICD-10-CM | POA: Diagnosis not present

## 2021-11-20 DIAGNOSIS — J449 Chronic obstructive pulmonary disease, unspecified: Secondary | ICD-10-CM | POA: Diagnosis not present

## 2021-11-20 DIAGNOSIS — I10 Essential (primary) hypertension: Secondary | ICD-10-CM | POA: Diagnosis not present

## 2021-11-20 DIAGNOSIS — S82201D Unspecified fracture of shaft of right tibia, subsequent encounter for closed fracture with routine healing: Secondary | ICD-10-CM | POA: Diagnosis not present

## 2021-11-20 DIAGNOSIS — K219 Gastro-esophageal reflux disease without esophagitis: Secondary | ICD-10-CM | POA: Diagnosis not present

## 2021-11-20 DIAGNOSIS — S82401D Unspecified fracture of shaft of right fibula, subsequent encounter for closed fracture with routine healing: Secondary | ICD-10-CM | POA: Diagnosis not present

## 2021-11-21 ENCOUNTER — Encounter: Payer: Self-pay | Admitting: Family Medicine

## 2021-11-21 ENCOUNTER — Ambulatory Visit (INDEPENDENT_AMBULATORY_CARE_PROVIDER_SITE_OTHER): Payer: Medicare Other | Admitting: Family Medicine

## 2021-11-21 VITALS — BP 124/62 | HR 94 | Temp 97.6°F | Ht 73.0 in

## 2021-11-21 DIAGNOSIS — I872 Venous insufficiency (chronic) (peripheral): Secondary | ICD-10-CM | POA: Diagnosis not present

## 2021-11-21 DIAGNOSIS — R3912 Poor urinary stream: Secondary | ICD-10-CM

## 2021-11-21 DIAGNOSIS — N401 Enlarged prostate with lower urinary tract symptoms: Secondary | ICD-10-CM | POA: Diagnosis not present

## 2021-11-21 DIAGNOSIS — R600 Localized edema: Secondary | ICD-10-CM | POA: Diagnosis not present

## 2021-11-21 MED ORDER — TAMSULOSIN HCL 0.4 MG PO CAPS: 0.4000 mg | ORAL_CAPSULE | Freq: Every day | ORAL | 3 refills | Status: DC

## 2021-11-21 NOTE — Progress Notes (Signed)
Established Patient Office Visit  Subjective   Patient ID: Cartrell Bentsen, male    DOB: 1931/12/14  Age: 88 y.o. MRN: [id number]  Chief Complaint  Patient presents with   Edema    HPI   Patient is seen for the following issues  Recently we received a call from his home physical therapist requesting venous Doppler.  Recent history is that back in February had a fall at home with right tib-fib fracture which required surgery.  This was followed by 10 weeks of rehab.  He is back home now.  Minimal ambulation.  He does have sitter and considerable assistance at home.  He has had some chronic edema since his surgery.  Has some edema in the left lower extremity as well but much worse than the right.  No significant pain.  Venous Doppler was obtained which showed no DVT.  There was then requested consider arterial Doppler.  At that point we recommend office follow-up to further assess.  He has no history of smoking.  No claudication symptoms.  He relates slow urine stream.  He has been on finasteride for several years.  Usually he has to urinate about 4 times at night.  Possible incomplete emptying of bladder.  No burning with urination.  No anticholinergic medication use.  Past Medical History:  Diagnosis Date   Arthritis    DUPUYTREN'S CONTRACTURE, RIGHT 07/04/2009   Annotation: 4th digit Qualifier: Diagnosis of  By: Arnoldo Morale MD, Balinda Quails    GERD (gastroesophageal reflux disease)    H/O hiatal hernia    "FOR YEARS" " NO PROBLEM"   Hyperlipidemia    Hypertension    Knee pain    Neuropathy    PERIPHERAL    Osteoporosis    PMR (polymyalgia rheumatica) (HCC)    Polymyalgia ([place]) 5 YEARS   Past Surgical History:  Procedure Laterality Date   CARPAL TUNNEL RELEASE     CATARACT EXTRACTION     right   COLONOSCOPY  2007   INGUINAL HERNIA REPAIR  10/14/2011   Procedure: HERNIA REPAIR INGUINAL ADULT;  Surgeon: [name], MD;  Location: [place];  Service: General;  Laterality: Right;   Right Inguinal Hernia Repair with Mesh   TIBIA IM NAIL INSERTION Right 05/22/2021   Procedure: INTRAMEDULLARY (IM) NAIL TIBIAL;  Surgeon: Altamese Delta, MD;  Location: [place];  Service: Orthopedics;  Laterality: Right;   TONSILLECTOMY     VARICOSE VEIN SURGERY      reports that he has never smoked. He has never used smokeless tobacco. He reports that he does not currently use alcohol after a past usage of about 3.0 standard drinks of alcohol per week. He reports that he does not use drugs. family history includes Coronary artery disease in an other family member; Heart disease in his father. Allergies  Allergen Reactions   Statins Other (See Comments)    Muscle pain and upset stomach    Review of Systems  Constitutional:  Negative for chills and fever.  Respiratory:  Negative for shortness of breath.   Cardiovascular:  Positive for leg swelling. Negative for chest pain and claudication.  Genitourinary:  Negative for frequency and urgency.      Objective:     BP 124/62 (BP Location: Left Arm, Patient Position: Sitting, Cuff Size: Normal)   Pulse 94   Temp 97.6 F (36.4 C) (Oral)   Ht '6\' 1"'$  (1.854 m)   SpO2 98%   BMI 22.43 kg/m    Physical Exam  Vitals reviewed.  Cardiovascular:     Rate and Rhythm: Normal rate and regular rhythm.  Pulmonary:     Effort: Pulmonary effort is normal.     Breath sounds: Normal breath sounds.  Musculoskeletal:     Right lower leg: Edema present.     Left lower leg: Edema present.     Comments: He has some edema lower extremities bilaterally right greater than left.  Right foot is warm to touch with excellent capillary refill.  Palpable dorsalis pedis pulse.  Neurological:     General: No focal deficit present.     Mental Status: He is alert.      No results found for any visits on 11/21/21.    The ASCVD Risk score (Arnett DK, et al., 2019) failed to calculate for the following reasons:   The 2019 ASCVD risk score is only valid for  ages 61 to 13    Assessment & Plan:   #1 bilateral leg edema right greater than left.  Right leg edema largely related to recent injury.  Suspect he has some significant venous stasis.  May have some diastolic dysfunction as well.  No evidence whatsoever to suggest significant arterial disease.  Do not recommend arterial Dopplers at this time.  Recent venous Doppler negative for DVT.  -Elevate leg frequently and focus on compression  #2 history of BPH.  Patient already on finasteride.  Consider adding Flomax 0.4 mg nightly.  Watch for any orthostasis symptoms.  Give feedback in a month Avoid anticholinergics  No follow-ups on file.    [name], MD

## 2021-11-21 NOTE — Patient Instructions (Signed)
Give me some feedback after starting the Flomax.

## 2021-11-23 ENCOUNTER — Other Ambulatory Visit: Payer: Self-pay | Admitting: Family Medicine

## 2021-11-23 DIAGNOSIS — S82401D Unspecified fracture of shaft of right fibula, subsequent encounter for closed fracture with routine healing: Secondary | ICD-10-CM | POA: Diagnosis not present

## 2021-11-23 DIAGNOSIS — K219 Gastro-esophageal reflux disease without esophagitis: Secondary | ICD-10-CM | POA: Diagnosis not present

## 2021-11-23 DIAGNOSIS — S82201D Unspecified fracture of shaft of right tibia, subsequent encounter for closed fracture with routine healing: Secondary | ICD-10-CM | POA: Diagnosis not present

## 2021-11-23 DIAGNOSIS — N400 Enlarged prostate without lower urinary tract symptoms: Secondary | ICD-10-CM | POA: Diagnosis not present

## 2021-11-23 DIAGNOSIS — I10 Essential (primary) hypertension: Secondary | ICD-10-CM | POA: Diagnosis not present

## 2021-11-23 DIAGNOSIS — J449 Chronic obstructive pulmonary disease, unspecified: Secondary | ICD-10-CM | POA: Diagnosis not present

## 2021-11-27 ENCOUNTER — Other Ambulatory Visit: Payer: Self-pay | Admitting: Family Medicine

## 2021-11-27 DIAGNOSIS — I10 Essential (primary) hypertension: Secondary | ICD-10-CM | POA: Diagnosis not present

## 2021-11-27 DIAGNOSIS — J449 Chronic obstructive pulmonary disease, unspecified: Secondary | ICD-10-CM | POA: Diagnosis not present

## 2021-11-27 DIAGNOSIS — K219 Gastro-esophageal reflux disease without esophagitis: Secondary | ICD-10-CM | POA: Diagnosis not present

## 2021-11-27 DIAGNOSIS — S82401D Unspecified fracture of shaft of right fibula, subsequent encounter for closed fracture with routine healing: Secondary | ICD-10-CM | POA: Diagnosis not present

## 2021-11-27 DIAGNOSIS — S82201D Unspecified fracture of shaft of right tibia, subsequent encounter for closed fracture with routine healing: Secondary | ICD-10-CM | POA: Diagnosis not present

## 2021-11-27 DIAGNOSIS — N400 Enlarged prostate without lower urinary tract symptoms: Secondary | ICD-10-CM | POA: Diagnosis not present

## 2021-11-27 NOTE — Progress Notes (Unsigned)
[facility] [place] [address] [place] Phone: [phone] Subjective:   [name], am serving as a scribe for Dr. Hulan Saas.  I'm seeing this patient by the request  of:  [name], MD  CC: Left hand pain, right heel pain  GGY:[id number]  04/24/2021 Hopefully patient responds well. Not as long as we would like.  Discussed posture and ergonomics and splinting at night.  Follow-up again in 6 to 8 weeks  Chronic problem with exacerbation.  Discussed icing regimen and home exercises, discussed which activities to do which wants to avoid.  Wear the brace for stability.  Worsening pain can consider the possibility of repeating viscosupplementation or even with Toradol injections intra-articular.  Follow-up again in 6 weeks  Chronic with exacerbation.  Continues to have discomfort and pain.  Discussed with patient about the gabapentin.  He still does not want to go up on the dosing of this.  Patient does feel the injections are helpful.  We seem to do well or better with this as well as a greater trochanteric injection in the epidurals of the back.  Follow-up with me again in 6 weeks  Update 11/28/2021 Alphonsus Doyel is a 88 y.o. male coming in with complaint of trigger finger, Left hand. Patient states did well as left visit. Finger is starting to lock again, but there is no pain. Also having pain in his right foot. Started while he was in PT for broken leg. Says that it swells and hurts on the lateral side and in heel, but will radiate up the leg.     Past Medical History:  Diagnosis Date   Arthritis    DUPUYTREN'S CONTRACTURE, RIGHT 07/04/2009   Annotation: 4th digit Qualifier: Diagnosis of  By: Arnoldo Morale MD, Balinda Quails    GERD (gastroesophageal reflux disease)    H/O hiatal hernia    "FOR YEARS" " NO PROBLEM"   Hyperlipidemia    Hypertension    Knee pain    Neuropathy    PERIPHERAL    Osteoporosis    PMR (polymyalgia rheumatica)  (HCC)    Polymyalgia ([place]) 5 YEARS   Past Surgical History:  Procedure Laterality Date   CARPAL TUNNEL RELEASE     CATARACT EXTRACTION     right   COLONOSCOPY  2007   INGUINAL HERNIA REPAIR  10/14/2011   Procedure: HERNIA REPAIR INGUINAL ADULT;  Surgeon: [name], MD;  Location: [place];  Service: General;  Laterality: Right;  Right Inguinal Hernia Repair with Mesh   TIBIA IM NAIL INSERTION Right 05/22/2021   Procedure: INTRAMEDULLARY (IM) NAIL TIBIAL;  Surgeon: Altamese [place], MD;  Location: [place];  Service: Orthopedics;  Laterality: Right;   TONSILLECTOMY     VARICOSE VEIN SURGERY     Social History   Socioeconomic History   Marital status: Widowed    Spouse name: Not on file   Number of children: Not on file   Years of education: Not on file   Highest education level: Master's degree (e.g., MA, MS, MEng, MEd, MSW, MBA)  Occupational History   Occupation: retired    Fish farm manager: RETIRED  Tobacco Use   Smoking status: Never   Smokeless tobacco: Never  Vaping Use   Vaping Use: Never used  Substance and Sexual Activity   Alcohol use: Not Currently    Alcohol/week: 3.0 standard drinks of alcohol    Types: 3 Glasses of wine per week    Comment: half  glass wine before dinner   Drug use: No   Sexual activity: Not Currently  Other Topics Concern   Not on file  Social History Narrative   Not on file   Social Determinants of Health   Financial Resource Strain: Low Risk  (08/28/2021)   Overall Financial Resource Strain (CARDIA)    Difficulty of Paying Living Expenses: Not hard at all  Food Insecurity: No Food Insecurity (11/19/2021)   Hunger Vital Sign    Worried About Running Out of Food in the Last Year: Never true    Ran Out of Food in the Last Year: Never true  Transportation Needs: No Transportation Needs (11/19/2021)   PRAPARE - Hydrologist (Medical): No    Lack of Transportation (Non-Medical): No  Physical Activity: Insufficiently  Active (11/19/2021)   Exercise Vital Sign    Days of Exercise per Week: 7 days    Minutes of Exercise per Session: 20 min  Stress: No Stress Concern Present (11/19/2021)   [place]    Feeling of Stress : Not at all  Social Connections: Moderately Integrated (11/19/2021)   Social Connection and Isolation Panel [NHANES]    Frequency of Communication with Friends and Family: Twice a week    Frequency of Social Gatherings with Friends and Family: Once a week    Attends Religious Services: More than 4 times per year    Active Member of [employer] or Organizations: Yes    Attends Archivist Meetings: More than 4 times per year    Marital Status: Widowed   Allergies  Allergen Reactions   Statins Other (See Comments)    Muscle pain and upset stomach   Family History  Problem Relation Age of Onset   Heart disease Father    Coronary artery disease Other      Current Outpatient Medications (Cardiovascular):    amLODipine (NORVASC) 5 MG tablet, TAKE 1 TABLET DAILY (Patient taking differently: Take by mouth at bedtime.)   Current Outpatient Medications (Analgesics):    meloxicam (MOBIC) 15 MG tablet, TAKE 1 TABLET DAILY   Current Outpatient Medications (Other):    famotidine (PEPCID) 20 MG tablet, TAKE 1 TABLET AT BEDTIME   finasteride (PROSCAR) 5 MG tablet, TAKE ONE TABLET EVERY DAY.   gabapentin (NEURONTIN) 100 MG capsule, TAKE 1 CAPSULE 4 TIMES     DAILY (Patient taking differently: Take 100 mg by mouth 4 (four) times daily.)   magnesium oxide (MAG-OX) 400 MG tablet, Take 400 mg by mouth daily.   polyethylene glycol (MIRALAX / GLYCOLAX) packet, Take 17 g by mouth daily as needed for moderate constipation. (Patient taking differently: Take 17 g by mouth 2 (two) times daily.)   tamsulosin (FLOMAX) 0.4 MG CAPS capsule, Take 1 capsule (0.4 mg total) by mouth daily.   vitamin C (ASCORBIC ACID) 500 MG tablet, Take 500 mg  by mouth daily.   VITAMIN D PO, Take 2,500 Units by mouth at bedtime.   Reviewed prior external information including notes and imaging from  primary care provider As well as notes that were available from care everywhere and other healthcare systems.  Past medical history, social, surgical and family history all reviewed in electronic medical record.  No pertanent information unless stated regarding to the chief complaint.   Review of Systems:  No headache, visual changes, nausea, vomiting, diarrhea, constipation, dizziness, abdominal pain, skin rash, fevers, chills, night sweats, weight loss, swollen lymph nodes,  body aches, joint swelling, chest pain, shortness of breath, mood changes. POSITIVE muscle aches  Objective  Blood pressure (!) 134/56, pulse 93, height '6\' 1"'$  (1.854 m), SpO2 98 %.   General: No apparent distress alert and oriented x3 mood and affect normal, dressed appropriately.  HEENT: Pupils equal, extraocular movements intact  Respiratory: Patient's speak in full sentences and does not appear short of breath  Cardiovascular:  Patient does have some lower extremity swelling bilaterally right greater than left.  The patient does have 2+ pitting edema.  Postsurgical changes of the lower extremity noted.  Patient does have decent range of motion of the knee.  Patient does have no significant tightness of the posterior capsule of the ankle.  Minimal tenderness over the Achilles itself.  Left hand exam shows the patient does have triggering noted at the A2 pulley of the ring finger on the left side.  Tender to palpation.  Procedure: Real-time Ultrasound Guided Injection of fourth flexor tendon sheath Device: GE Logiq Q7 Ultrasound guided injection is preferred based studies that show increased duration, increased effect, greater accuracy, decreased procedural pain, increased response rate, and decreased cost with ultrasound guided versus blind injection.  Verbal informed consent  obtained.  Time-out conducted.  Noted no overlying erythema, induration, or other signs of local infection.  Skin prepped in a sterile fashion.  Local anesthesia: Topical Ethyl chloride.  With sterile technique and under real time ultrasound guidance: With a 25-gauge half inch needle injecting 0.5 cc of 0.5% Marcaine and 0.5 cc of Kenalog 40 mg/mL. Completed without difficulty  Pain immediately improved suggesting accurate placement of the medication.  Advised to call if fevers/chills, erythema, induration, drainage, or persistent bleeding.  Impression: Technically successful ultrasound guided injection.    Impression and Recommendations:     The above documentation has been reviewed and is accurate and complete Lyndal Pulley, DO

## 2021-11-28 ENCOUNTER — Encounter: Payer: Self-pay | Admitting: Family Medicine

## 2021-11-28 ENCOUNTER — Telehealth: Payer: Self-pay | Admitting: Family Medicine

## 2021-11-28 ENCOUNTER — Ambulatory Visit (INDEPENDENT_AMBULATORY_CARE_PROVIDER_SITE_OTHER): Payer: Medicare Other | Admitting: Family Medicine

## 2021-11-28 ENCOUNTER — Ambulatory Visit: Payer: Self-pay

## 2021-11-28 VITALS — BP 134/56 | HR 93 | Ht 73.0 in

## 2021-11-28 DIAGNOSIS — S82401D Unspecified fracture of shaft of right fibula, subsequent encounter for closed fracture with routine healing: Secondary | ICD-10-CM | POA: Diagnosis not present

## 2021-11-28 DIAGNOSIS — K219 Gastro-esophageal reflux disease without esophagitis: Secondary | ICD-10-CM | POA: Diagnosis not present

## 2021-11-28 DIAGNOSIS — G8929 Other chronic pain: Secondary | ICD-10-CM

## 2021-11-28 DIAGNOSIS — M79671 Pain in right foot: Secondary | ICD-10-CM

## 2021-11-28 DIAGNOSIS — S82201D Unspecified fracture of shaft of right tibia, subsequent encounter for closed fracture with routine healing: Secondary | ICD-10-CM | POA: Diagnosis not present

## 2021-11-28 DIAGNOSIS — N400 Enlarged prostate without lower urinary tract symptoms: Secondary | ICD-10-CM | POA: Diagnosis not present

## 2021-11-28 DIAGNOSIS — M65342 Trigger finger, left ring finger: Secondary | ICD-10-CM | POA: Diagnosis not present

## 2021-11-28 DIAGNOSIS — J449 Chronic obstructive pulmonary disease, unspecified: Secondary | ICD-10-CM | POA: Diagnosis not present

## 2021-11-28 DIAGNOSIS — I10 Essential (primary) hypertension: Secondary | ICD-10-CM | POA: Diagnosis not present

## 2021-11-28 NOTE — Telephone Encounter (Signed)
Noted  Darin [name] W Serai Tukes MD [facility]  

## 2021-11-28 NOTE — Patient Instructions (Signed)
Heel Lift or Brace Injection in finger

## 2021-11-28 NOTE — Telephone Encounter (Signed)
[facility] OT with [facility] is calling to let md know the pt has been D/C from OT today

## 2021-11-28 NOTE — Assessment & Plan Note (Signed)
Repeat injection given today, tolerated the procedure well, discussed icing regimen and home exercises, which activities to do which ones to avoid, we discussed posture and ergonomics.  Follow-up again in 6 to 8 weeks.

## 2021-11-28 NOTE — Assessment & Plan Note (Signed)
Patient is having chronic heel pain, I do think some of it is secondary to the lower extremity edema.  We did discuss also the possibility of arterial Dopplers.  Patient did have a venous Doppler that was negative per primary care records as well.  At this point we will give patient a heel lift to see if this will be beneficial.  We discussed a also a pneumatic heel compression that could be more beneficial as well.  We discussed elevation when patient has a chance to try to get it above his heart to help with some of the edema.  Follow-up with me again in 2 months otherwise.  Worsening pain to seek medical attention immediately.

## 2021-12-03 ENCOUNTER — Telehealth: Payer: Self-pay | Admitting: *Deleted

## 2021-12-03 NOTE — Patient Outreach (Signed)
  Care Coordination   12/03/2021 Name: Darin [name] MRN: [id number] DOB: May 13, 1931   Care Coordination Outreach Attempts:  An unsuccessful telephone outreach was attempted today to offer the patient information about available care coordination services as a benefit of their health plan.   Follow Up Plan:  Additional outreach attempts will be made to offer the patient care coordination information and services.   Encounter Outcome:  No Answer  Care Coordination Interventions Activated:  No   Care Coordination Interventions:  No, not indicated    Raina Mina, RN Care Management Coordinator [place] Office [phone]

## 2021-12-05 DIAGNOSIS — N400 Enlarged prostate without lower urinary tract symptoms: Secondary | ICD-10-CM | POA: Diagnosis not present

## 2021-12-05 DIAGNOSIS — S82401D Unspecified fracture of shaft of right fibula, subsequent encounter for closed fracture with routine healing: Secondary | ICD-10-CM | POA: Diagnosis not present

## 2021-12-05 DIAGNOSIS — I10 Essential (primary) hypertension: Secondary | ICD-10-CM | POA: Diagnosis not present

## 2021-12-05 DIAGNOSIS — S82201D Unspecified fracture of shaft of right tibia, subsequent encounter for closed fracture with routine healing: Secondary | ICD-10-CM | POA: Diagnosis not present

## 2021-12-05 DIAGNOSIS — K219 Gastro-esophageal reflux disease without esophagitis: Secondary | ICD-10-CM | POA: Diagnosis not present

## 2021-12-05 DIAGNOSIS — J449 Chronic obstructive pulmonary disease, unspecified: Secondary | ICD-10-CM | POA: Diagnosis not present

## 2021-12-07 ENCOUNTER — Encounter: Payer: Self-pay | Admitting: Podiatry

## 2021-12-07 ENCOUNTER — Ambulatory Visit (INDEPENDENT_AMBULATORY_CARE_PROVIDER_SITE_OTHER): Payer: Medicare Other | Admitting: Podiatry

## 2021-12-07 DIAGNOSIS — B351 Tinea unguium: Secondary | ICD-10-CM | POA: Diagnosis not present

## 2021-12-07 DIAGNOSIS — M79674 Pain in right toe(s): Secondary | ICD-10-CM | POA: Diagnosis not present

## 2021-12-07 DIAGNOSIS — M79675 Pain in left toe(s): Secondary | ICD-10-CM

## 2021-12-07 DIAGNOSIS — G609 Hereditary and idiopathic neuropathy, unspecified: Secondary | ICD-10-CM

## 2021-12-07 NOTE — Progress Notes (Signed)
This patient returns to my office for at risk foot care.  This patient requires this care by a professional since this patient will be at risk due to having idiopathic neuropathy.  This patient is unable to cut nails himself since the patient cannot reach his nails.These nails are painful walking and wearing shoes.  This patient presents for at risk foot care today.  General Appearance  Alert, conversant and in no acute stress.  Vascular  Dorsalis pedis and posterior tibial  pulses are weaklabsent   palpable  bilaterally.  Capillary return is within normal limits  Bilaterally.Cold feet.  bilaterally.  Neurologic  Senn-Weinstein monofilament wire test within normal limits  bilaterally. Muscle power within normal limits bilaterally.  Nails Thick disfigured discolored nails with subungual debris  from hallux to fifth toes bilaterally. Pincer nails  B/L. No evidence of bacterial infection or drainage bilaterally.  Orthopedic  No limitations of motion  feet .  No crepitus or effusions noted.  No bony pathology or digital deformities noted.  Skin  normotropic skin with no porokeratosis noted bilaterally.  No signs of infections or ulcers noted.    Onychomycosis  Pain in right toes  Pain in left toes  Callus left foot.  Consent was obtained for treatment procedures.   Mechanical debridement of nails 1-5  bilaterally performed with a nail nipper.  Filed with dremel without incident.    Return office visit   3 months                  Told patient to return for periodic foot care and evaluation due to potential at risk complications.   Hendricks Schwandt DPM  

## 2021-12-14 ENCOUNTER — Ambulatory Visit: Payer: Medicare Other | Admitting: Family Medicine

## 2021-12-21 ENCOUNTER — Telehealth: Payer: Self-pay | Admitting: *Deleted

## 2021-12-21 NOTE — Patient Outreach (Signed)
  Care Coordination   12/21/2021 Name: Darin [name] MRN: [id number] DOB: 04-Nov-1931   Care Coordination Outreach Attempts:  A second unsuccessful outreach was attempted today to offer the patient with information about available care coordination services as a benefit of their health plan.     Follow Up Plan:  Additional outreach attempts will be made to offer the patient care coordination information and services.   Encounter Outcome:  No Answer  Care Coordination Interventions Activated:  No   Care Coordination Interventions:  No, not indicated    Raina Mina, RN Care Management Coordinator [place] Office [phone]

## 2021-12-27 DIAGNOSIS — D2271 Melanocytic nevi of right lower limb, including hip: Secondary | ICD-10-CM | POA: Diagnosis not present

## 2021-12-27 DIAGNOSIS — Z85828 Personal history of other malignant neoplasm of skin: Secondary | ICD-10-CM | POA: Diagnosis not present

## 2021-12-27 DIAGNOSIS — L905 Scar conditions and fibrosis of skin: Secondary | ICD-10-CM | POA: Diagnosis not present

## 2021-12-27 DIAGNOSIS — D1801 Hemangioma of skin and subcutaneous tissue: Secondary | ICD-10-CM | POA: Diagnosis not present

## 2021-12-27 DIAGNOSIS — L814 Other melanin hyperpigmentation: Secondary | ICD-10-CM | POA: Diagnosis not present

## 2021-12-27 DIAGNOSIS — L97811 Non-pressure chronic ulcer of other part of right lower leg limited to breakdown of skin: Secondary | ICD-10-CM | POA: Diagnosis not present

## 2021-12-27 DIAGNOSIS — D485 Neoplasm of uncertain behavior of skin: Secondary | ICD-10-CM | POA: Diagnosis not present

## 2021-12-27 DIAGNOSIS — D225 Melanocytic nevi of trunk: Secondary | ICD-10-CM | POA: Diagnosis not present

## 2021-12-27 DIAGNOSIS — L281 Prurigo nodularis: Secondary | ICD-10-CM | POA: Diagnosis not present

## 2021-12-27 DIAGNOSIS — D2272 Melanocytic nevi of left lower limb, including hip: Secondary | ICD-10-CM | POA: Diagnosis not present

## 2021-12-28 ENCOUNTER — Ambulatory Visit (INDEPENDENT_AMBULATORY_CARE_PROVIDER_SITE_OTHER): Payer: Medicare Other

## 2021-12-28 DIAGNOSIS — Z23 Encounter for immunization: Secondary | ICD-10-CM

## 2021-12-29 ENCOUNTER — Other Ambulatory Visit: Payer: Self-pay | Admitting: Family Medicine

## 2022-01-01 DIAGNOSIS — Z23 Encounter for immunization: Secondary | ICD-10-CM | POA: Diagnosis not present

## 2022-01-02 ENCOUNTER — Telehealth: Payer: Self-pay

## 2022-01-02 NOTE — Patient Instructions (Signed)
Visit Information  Thank you for taking time to visit with me today. Please don't hesitate to contact me if I can be of assistance to you.   Following are the goals we discussed today:   Goals Addressed             This Visit's Progress    COMPLETED: Care Coordination Activities - no follow up required       Care Coordination Interventions: Provided education to patient re: Annual Wellness Visit, care coordination services Assessed social determinant of health barriers          If you are experiencing a Mental Health or Behavioral Health Crisis or need someone to talk to, please call the Suicide and Crisis Lifeline: 988 call the [place] National Suicide Prevention Lifeline: 1-800-273-8255 or TTY: 1-800-799-4 TTY (1-800-799-4889) to talk to a trained counselor call 1-800-273-TALK (toll free, 24 hour hotline) go to [facility] [address], [place] ([phone]) call 911   Patient verbalizes understanding of instructions and care plan provided today and agrees to view in MyChart. Active MyChart status and patient understanding of how to access instructions and care plan via MyChart confirmed with patient.     No further follow up required:    Dixie Coppa RN, BSN,CCM, CDE Care Management Coordinator [facility] Care Management [phone]     

## 2022-01-30 ENCOUNTER — Ambulatory Visit: Payer: Medicare Other | Admitting: Family Medicine

## 2022-02-14 ENCOUNTER — Other Ambulatory Visit: Payer: Self-pay | Admitting: Family Medicine

## 2022-03-14 ENCOUNTER — Telehealth: Payer: Self-pay | Admitting: Family Medicine

## 2022-03-14 NOTE — Telephone Encounter (Signed)
Pt need a refill on  benazepril-hctz 10-12.5 mg  [facility] [place] [place] [facility] [place] Sites Phone: [phone]  Fax: [phone]

## 2022-03-14 NOTE — Telephone Encounter (Signed)
Message sent to PCP as Rx is not on the current medication list.

## 2022-03-15 ENCOUNTER — Ambulatory Visit (INDEPENDENT_AMBULATORY_CARE_PROVIDER_SITE_OTHER): Payer: Medicare Other | Admitting: Podiatry

## 2022-03-15 ENCOUNTER — Encounter: Payer: Self-pay | Admitting: Podiatry

## 2022-03-15 DIAGNOSIS — M79674 Pain in right toe(s): Secondary | ICD-10-CM

## 2022-03-15 DIAGNOSIS — M79675 Pain in left toe(s): Secondary | ICD-10-CM | POA: Diagnosis not present

## 2022-03-15 DIAGNOSIS — G609 Hereditary and idiopathic neuropathy, unspecified: Secondary | ICD-10-CM

## 2022-03-15 DIAGNOSIS — B351 Tinea unguium: Secondary | ICD-10-CM

## 2022-03-15 MED ORDER — BENAZEPRIL-HYDROCHLOROTHIAZIDE 10-12.5 MG PO TABS: 1.0000 | ORAL_TABLET | Freq: Every day | ORAL | 1 refills | Status: DC

## 2022-03-15 NOTE — Telephone Encounter (Signed)
I left a detailed message at the patient's cell number stating the Rx was sent to [facility].  Message also left stating we have not received a handicapped placard; however we do have the forms here and we can send a request for PCP to review OR he can await the mail as this takes some time to be received here in the clinic.  Requested the patient call back and leave a message with his preference.

## 2022-03-15 NOTE — Progress Notes (Signed)
This patient returns to my office for at risk foot care.  This patient requires this care by a professional since this patient will be at risk due to having idiopathic neuropathy.  This patient is unable to cut nails himself since the patient cannot reach his nails.These nails are painful walking and wearing shoes.  This patient presents for at risk foot care today.  General Appearance  Alert, conversant and in no acute stress.  Vascular  Dorsalis pedis and posterior tibial  pulses are weaklabsent   palpable  bilaterally.  Capillary return is within normal limits  Bilaterally.Cold feet.  bilaterally.  Neurologic  Senn-Weinstein monofilament wire test within normal limits  bilaterally. Muscle power within normal limits bilaterally.  Nails Thick disfigured discolored nails with subungual debris  from hallux to fifth toes bilaterally. Pincer nails  B/L. No evidence of bacterial infection or drainage bilaterally.  Orthopedic  No limitations of motion  feet .  No crepitus or effusions noted.  No bony pathology or digital deformities noted.  Skin  normotropic skin with no porokeratosis noted bilaterally.  No signs of infections or ulcers noted.    Onychomycosis  Pain in right toes  Pain in left toes  Callus left foot.  Consent was obtained for treatment procedures.   Mechanical debridement of nails 1-5  bilaterally performed with a nail nipper.  Filed with dremel without incident.    Return office visit   3 months                  Told patient to return for periodic foot care and evaluation due to potential at risk complications.   Gardiner Barefoot DPM

## 2022-03-15 NOTE — Telephone Encounter (Signed)
Left a message for the patient to return my call.  

## 2022-03-15 NOTE — Telephone Encounter (Signed)
Pt called to request a refill of the  benazepril-hydrochlorthiazide (LOTENSIN HCT) 10-12.5 MG tablet   Please advise.   [facility] [place] [place] [facility] [place] Sites Phone: [phone]  Fax: [phone]       Also, Pt wanted to ask MD if he received the Handicap Parking Form he mailed to [place]?

## 2022-03-19 DIAGNOSIS — Z85828 Personal history of other malignant neoplasm of skin: Secondary | ICD-10-CM | POA: Diagnosis not present

## 2022-03-19 DIAGNOSIS — C44622 Squamous cell carcinoma of skin of right upper limb, including shoulder: Secondary | ICD-10-CM | POA: Diagnosis not present

## 2022-03-19 DIAGNOSIS — L720 Epidermal cyst: Secondary | ICD-10-CM | POA: Diagnosis not present

## 2022-03-19 DIAGNOSIS — D2261 Melanocytic nevi of right upper limb, including shoulder: Secondary | ICD-10-CM | POA: Diagnosis not present

## 2022-03-20 NOTE — Telephone Encounter (Signed)
I spoke with the patient and he has confirmed refill on Benazepril HCTZ. We have not received Handicap placard at this time. Patient informed that we do have placards in office and form has been given to PCP for completion.

## 2022-04-05 ENCOUNTER — Telehealth: Payer: Medicare Other

## 2022-05-01 ENCOUNTER — Telehealth: Payer: Self-pay

## 2022-05-01 NOTE — Progress Notes (Signed)
Patient ID: Darin [name], male   DOB: 05-07-1931, 89 y.o.   MRN: [id number]  Care Management & Coordination Services Pharmacy Team  Reason for Encounter: Offer Appointment with Pharmacist   05/01/22 Call to patient to offer to reschedule with Pharmacist Theo Dills. Did not reach left detailed message with my contact information for a return call.  What concerns do you have about your medications?    Chart Updates:  Recent office visits:  11/21/21 [name], MD - Patient presented for Edema of right lower leg due to venous stasis and other concerns. Prescribed Tamsulosin. Stopped Enoxaparin. Stopped Nutr. Supplements. Stopped Triamcinolone.  Recent consult visits:  03/15/22 Gardiner Barefoot, DPM - Patient presented for Pain due to onychomycosis of toenails of both feet. No medication changes.   12/07/21  Gardiner Barefoot, DPM - Patient presented for Pain due to onychomycosis of toenails of both feet. No medication changes.   11/28/21 Lyndal Pulley, DO (Sports Med) - Patient presented for trigger finger of left ring finger. No medication changes.   Hospital visits:  None in previous 6 months  Medications: Outpatient Encounter Medications as of 05/01/2022  Medication Sig   amLODipine (NORVASC) 5 MG tablet TAKE 1 TABLET DAILY   benazepril-hydrochlorthiazide (LOTENSIN HCT) 10-12.5 MG tablet Take 1 tablet by mouth daily.   famotidine (PEPCID) 20 MG tablet TAKE 1 TABLET AT BEDTIME   finasteride (PROSCAR) 5 MG tablet TAKE ONE TABLET EVERY DAY.   gabapentin (NEURONTIN) 100 MG capsule TAKE 1 CAPSULE 4 TIMES     DAILY (Patient taking differently: Take 100 mg by mouth 4 (four) times daily.)   magnesium oxide (MAG-OX) 400 MG tablet Take 400 mg by mouth daily.   meloxicam (MOBIC) 15 MG tablet TAKE 1 TABLET DAILY   polyethylene glycol (MIRALAX / GLYCOLAX) packet Take 17 g by mouth daily as needed for moderate constipation. (Patient taking differently: Take 17 g by mouth 2 (two) times daily.)    tamsulosin (FLOMAX) 0.4 MG CAPS capsule Take 1 capsule (0.4 mg total) by mouth daily.   vitamin C (ASCORBIC ACID) 500 MG tablet Take 500 mg by mouth daily.   VITAMIN D PO Take 2,500 Units by mouth at bedtime.   No facility-administered encounter medications on file as of 05/01/2022.    Recent vitals BP Readings from Last 3 Encounters:  11/28/21 (!) 134/56  11/21/21 124/62  09/04/21 138/60   Pulse Readings from Last 3 Encounters:  11/28/21 93  11/21/21 94  05/24/21 82   Wt Readings from Last 3 Encounters:  09/04/21 170 lb (77.1 kg)  08/28/21 170 lb (77.1 kg)  05/22/21 200 lb 3.2 oz (90.8 kg)   BMI Readings from Last 3 Encounters:  11/28/21 22.43 kg/m  11/21/21 22.43 kg/m  09/04/21 22.43 kg/m    Recent lab results    Component Value Date/Time   NA 137 05/24/2021 0258   K 3.6 05/24/2021 0258   CL 103 05/24/2021 0258   CO2 26 05/24/2021 0258   GLUCOSE 118 (H) 05/24/2021 0258   BUN 18 05/24/2021 0258   CREATININE 0.79 05/24/2021 0258   CREATININE 0.73 02/25/2020 0900   CALCIUM 8.6 (L) 05/24/2021 0258    Lab Results  Component Value Date   CREATININE 0.79 05/24/2021   GFR 102.17 04/08/2018   GFRNONAA >60 05/24/2021   GFRAA >60 03/16/2017   Lab Results  Component Value Date/Time   HGBA1C 5.7 (H) 05/23/2021 04:25 AM    Lab Results  Component Value Date   CHOL  211 (H) 05/22/2021   HDL 73 05/22/2021   LDLCALC 125 (H) 05/22/2021   LDLDIRECT 148.5 12/12/2009   TRIG 67 05/22/2021   CHOLHDL 2.9 05/22/2021    Care Gaps: AWV- 08/28/21 Zoster Vaccine - Overdue COVID Booster - Overdue    Star Rating Drugs:   None      Ned Clines [place] Clinical Pharmacist Assistant [phone]

## 2022-05-15 NOTE — Progress Notes (Signed)
Care Management & Coordination Services Pharmacy Note  05/17/2022 Name:  Darin [name] MRN:  [id number] DOB:  05-27-1931  Summary: -BP at goal -LDL <130, counseled on activity and diet  Recommendations/Changes made from today's visit: -Continue medication therapy -Continue to be mindful of sodium intake -Limit fried, fatty food in diet that may raise your cholesterol -Goal of 150 min of activity/week as tolerated  Follow up plan: BP review in 8 months Pharmacist visit in 1 year   Subjective: Darin [name] is an 89 y.o. year old male who is a primary patient of Burchette, Alinda Sierras, MD.  The care coordination team was consulted for assistance with disease management and care coordination needs.    Engaged with patient by telephone for follow up visit.  Recent office visits: 11/21/21 Darin Post, MD - Patient presented for Edema of right lower leg due to venous stasis and other concerns. Prescribed Tamsulosin. Stopped Enoxaparin. Stopped Nutr. Supplements. Stopped Triamcinolone.   Recent consult visits: 03/15/22 Darin [name], DPM - Patient presented for Pain due to onychomycosis of toenails of both feet. No medication changes.   Hospital visits: None in previous 6 months   Objective:  Lab Results  Component Value Date   CREATININE 0.79 05/24/2021   BUN 18 05/24/2021   GFR 102.17 04/08/2018   GFRNONAA >60 05/24/2021   GFRAA >60 03/16/2017   NA 137 05/24/2021   K 3.6 05/24/2021   CALCIUM 8.6 (L) 05/24/2021   CO2 26 05/24/2021   GLUCOSE 118 (H) 05/24/2021    Lab Results  Component Value Date/Time   HGBA1C 5.7 (H) 05/23/2021 04:25 AM   GFR 102.17 04/08/2018 08:46 AM   GFR 120.26 04/02/2017 11:56 AM    Last diabetic Eye exam: No results found for: "HMDIABEYEEXA"  Last diabetic Foot exam: No results found for: "HMDIABFOOTEX"   Lab Results  Component Value Date   CHOL 211 (H) 05/22/2021   HDL 73 05/22/2021   LDLCALC 125 (H) 05/22/2021   LDLDIRECT 148.5  12/12/2009   TRIG 67 05/22/2021   CHOLHDL 2.9 05/22/2021       Latest Ref Rng & Units 05/21/2021    3:06 AM 05/20/2021   10:02 PM 03/12/2017    8:19 AM  Hepatic Function  Total Protein 6.5 - 8.1 g/dL 5.9  5.7  6.1   Albumin 3.5 - 5.0 g/dL 2.9  2.7  3.1   AST 15 - 41 U/L '25  30  19   '$ ALT 0 - 44 U/L '22  22  15   '$ Alk Phosphatase 38 - 126 U/L 41  45  52   Total Bilirubin 0.3 - 1.2 mg/dL 0.7  0.5  0.6   Bilirubin, Direct 0.0 - 0.2 mg/dL  0.1      Lab Results  Component Value Date/Time   TSH 2.471 03/29/2012 08:35 PM   TSH 3.22 03/01/2008 10:42 AM       Latest Ref Rng & Units 05/24/2021    2:58 AM 05/23/2021    4:25 AM 05/21/2021    3:06 AM  CBC  WBC 4.0 - 10.5 K/uL 6.5  6.6  9.1   Hemoglobin 13.0 - 17.0 g/dL 9.5  10.7  12.3   Hematocrit 39.0 - 52.0 % 28.0  31.2  36.3   Platelets 150 - 400 K/uL 156  142  179     Lab Results  Component Value Date/Time   VD25OH See Scanned report in [place] 05/23/2021 04:25 AM   VITAMINB12 1,412 (H)  03/29/2012 08:35 PM   [id number] (H) 01/29/2011 01:06 PM    Clinical ASCVD: No  The ASCVD Risk score (Arnett DK, et al., 2019) failed to calculate for the following reasons:   The 2019 ASCVD risk score is only valid for ages 68 to 50       08/28/2021    1:56 PM 06/20/2020    8:16 AM 02/25/2020    8:36 AM  Depression screen PHQ 2/9  Decreased Interest 0 0 0  Down, Depressed, Hopeless 0 0 0  PHQ - 2 Score 0 0 0     Social History   Tobacco Use  Smoking Status Never  Smokeless Tobacco Never   BP Readings from Last 3 Encounters:  11/28/21 (!) 134/56  11/21/21 124/62  09/04/21 138/60   Pulse Readings from Last 3 Encounters:  11/28/21 93  11/21/21 94  05/24/21 82   Wt Readings from Last 3 Encounters:  09/04/21 170 lb (77.1 kg)  08/28/21 170 lb (77.1 kg)  05/22/21 200 lb 3.2 oz (90.8 kg)   BMI Readings from Last 3 Encounters:  11/28/21 22.43 kg/m  11/21/21 22.43 kg/m  09/04/21 22.43 kg/m    Allergies  Allergen  Reactions   Statins Other (See Comments)    Muscle pain and upset stomach    Medications Reviewed Today     Reviewed by Maren Reamer, RPH (Pharmacist) on 05/17/22 at 71  Med List Status: <None>   Medication Order Taking? Sig Documenting Provider Last Dose Status Informant  amLODipine (NORVASC) 5 MG tablet [id number]  TAKE 1 TABLET DAILY Burchette, Alinda Sierras, MD  Active   benazepril-hydrochlorthiazide (LOTENSIN HCT) 10-12.5 MG tablet [id number]  Take 1 tablet by mouth daily. Darin Post, MD  Active   famotidine (PEPCID) 20 MG tablet [id number]  TAKE 1 TABLET AT BEDTIME Darin Post, MD  Active   finasteride (PROSCAR) 5 MG tablet [id number]  TAKE ONE TABLET EVERY DAY. Darin Post, MD  Active   gabapentin (NEURONTIN) 100 MG capsule [id number] No TAKE 1 CAPSULE 4 TIMES     DAILY  Patient taking differently: Take 100 mg by mouth 4 (four) times daily.   Darin Post, MD Taking Active Self  magnesium oxide (MAG-OX) 400 MG tablet [id number] No Take 400 mg by mouth daily. [provider] Taking Active Self  meloxicam (MOBIC) 15 MG tablet [id number]  TAKE 1 TABLET DAILY Burchette, Alinda Sierras, MD  Active   polyethylene glycol (MIRALAX / GLYCOLAX) packet [id number] No Take 17 g by mouth daily as needed for moderate constipation.  Patient taking differently: Take 17 g by mouth 2 (two) times daily.   Aline August, MD Taking Active Self  tamsulosin (FLOMAX) 0.4 MG CAPS capsule [id number]  Take 1 capsule (0.4 mg total) by mouth daily. Darin Post, MD  Active   vitamin C (ASCORBIC ACID) 500 MG tablet [id number] No Take 500 mg by mouth daily. [provider] Taking Active Self  VITAMIN D PO [id number] No Take 2,500 Units by mouth at bedtime. [provider] Taking Active Self            SDOH:  (Social Determinants of Health) assessments and interventions performed: Yes SDOH Interventions    Flowsheet Row Care Coordination from 05/17/2022 in  [place] Telephone from 01/02/2022 in [place] Management from 01/27/2020 in [place] at [place] Interventions Intervention Not  Indicated Intervention Not Indicated --  Housing Interventions -- Intervention Not Indicated --  Transportation Interventions -- Intervention Not Indicated Intervention Not Indicated  Utilities Interventions Intervention Not Indicated Intervention Not Indicated --  Financial Strain Interventions Intervention Not Indicated -- Intervention Not Indicated       Medication Assistance: None required.  Patient affirms current coverage meets needs.  Medication Access: Within the past 30 days, how often has patient missed a dose of medication? None Is a pillbox or other method used to improve adherence? Yes  Factors that may affect medication adherence? no barriers identified Are meds synced by current pharmacy? No  Are meds delivered by current pharmacy? Yes  Does patient experience delays in picking up medications due to transportation concerns? No   [facility] Services Reviewed: Is patient disadvantaged to use [facility]?: Yes  Current Rx insurance plan: Medicare Name and location of Current pharmacy:  [facility], [place] [address] Phone: [phone] Fax: [phone]  [facility] [place] [facility] [place] [address] Phone: [phone] Fax: [phone]  [facility] services reviewed with patient today?: No  Patient requests to transfer care to [facility]?: No  Reason patient declined to change pharmacies: Disadvantaged due to insurance/mail order  Compliance/Adherence/Medication fill history: Care  Gaps: Vaccines: COVID, Shingles  Star-Rating Drugs: Benazepril-HCTZ 10-12.'5mg'$  PDC 100%   Assessment/Plan   Hypertension (BP goal <140/90) -Controlled -Current treatment: Benazepril-HCTZ 10-12.'5mg'$  1 qd Appropriate, Effective, Safe, Accessible Amlodipine '5mg'$  1 qd Appropriate, Effective, Safe, Accessible -Medications previously tried: Atenolol  -Current home readings: No cuff to check at home -Current dietary habits: adds some salt to food but is mindful to keep it small -Current exercise habits: walks daily with walker for short periods of time -Denies hypotensive/hypertensive symptoms -Educated on BP goals and benefits of medications for prevention of heart attack, stroke and kidney damage; Daily salt intake goal < 2300 mg; Exercise goal of 150 minutes per week; Symptoms of hypotension and importance of maintaining adequate hydration; -Counseled to notify office if any dizziness/headaches (signs of low/high BP). -Counseled on diet and exercise extensively Recommended to continue current medication  Hyperlipidemia: (LDL goal < 130) -Controlled -Current treatment: None -Medications previously tried: Statins  -Current dietary patterns: Not discussed -Current exercise habits: see above -Educated on Cholesterol goals;  Importance of limiting foods high in cholesterol; Exercise goal of 150 minutes per week; -Counseled on diet and exercise extensively  BPH (Goal: improve urination -Not assessed today -Current treatment: Tamsulosin 0.'4mg'$  qd Appropriate, Effective, Safe, Accessible Finasteride '5mg'$  qd Appropriate, Effective, Safe, Accessible  Chronic Pain (Goal: Achieve pain control that allows for ADLs) -Not assessed today -Current treatment  Gabapentin '100mg'$  QID Appropriate, Effective, Safe, Accessible Meloxicam '15mg'$  1 qd Appropriate, Effective, Safe, Accessible  OTC (Goal: Monitor for drug interactions and safe med use) -Current treatment  Vitamin D 2500 units qhs  Appropriate, Effective, Safe, Accessible Vitamin C '500MG'$  qd Appropriate, Effective, Safe, Accessible MagOx '400mg'$  1 qd Appropriate, Effective, Safe, Accessible    Maren Reamer Clinical Pharmacist [phone]

## 2022-05-16 ENCOUNTER — Telehealth: Payer: Self-pay

## 2022-05-16 NOTE — Progress Notes (Signed)
Patient ID: Darin [name], male   DOB: 17-May-1931, 89 y.o.   MRN: [id number]  Care Management & Coordination Services Pharmacy Team  Reason for Encounter: Appointment Reminder  Contacted patient to confirm telephone appointment with Theo Dills, PharmD on 05/17/22 at 57. Unsuccessful outreach. Left voicemail for patient to return call.   Star Rating Drugs:  none   Care Gaps: AWV- 08/28/21 Zoster Vaccine - Overdue COVID Booster - [place] Clinical Pharmacist Assistant [phone]

## 2022-05-17 ENCOUNTER — Ambulatory Visit: Payer: Medicare Other

## 2022-05-22 ENCOUNTER — Other Ambulatory Visit: Payer: Self-pay | Admitting: Family Medicine

## 2022-06-19 ENCOUNTER — Ambulatory Visit: Payer: Medicare Other | Admitting: Podiatry

## 2022-06-21 ENCOUNTER — Ambulatory Visit: Payer: Medicare Other | Admitting: Podiatry

## 2022-06-24 ENCOUNTER — Other Ambulatory Visit: Payer: Self-pay | Admitting: Internal Medicine

## 2022-07-01 ENCOUNTER — Encounter: Payer: Self-pay | Admitting: Podiatry

## 2022-07-01 ENCOUNTER — Ambulatory Visit (INDEPENDENT_AMBULATORY_CARE_PROVIDER_SITE_OTHER): Payer: Medicare Other | Admitting: Podiatry

## 2022-07-01 DIAGNOSIS — M79675 Pain in left toe(s): Secondary | ICD-10-CM

## 2022-07-01 DIAGNOSIS — B351 Tinea unguium: Secondary | ICD-10-CM

## 2022-07-01 DIAGNOSIS — M79674 Pain in right toe(s): Secondary | ICD-10-CM | POA: Diagnosis not present

## 2022-07-01 DIAGNOSIS — G609 Hereditary and idiopathic neuropathy, unspecified: Secondary | ICD-10-CM | POA: Diagnosis not present

## 2022-07-01 NOTE — Progress Notes (Signed)
This patient returns to my office for at risk foot care.  This patient requires this care by a professional since this patient will be at risk due to having idiopathic neuropathy.  This patient is unable to cut nails himself since the patient cannot reach his nails.These nails are painful walking and wearing shoes.  This patient presents for at risk foot care today.  General Appearance  Alert, conversant and in no acute stress.  Vascular  Dorsalis pedis and posterior tibial  pulses are weak/absent   palpable  bilaterally.  Capillary return is within normal limits  Bilaterally.Cold feet.  bilaterally.  Neurologic  Senn-Weinstein monofilament wire test within normal limits  bilaterally. Muscle power within normal limits bilaterally.  Nails Thick disfigured discolored nails with subungual debris  from hallux to fifth toes bilaterally. Pincer nails  B/L. No evidence of bacterial infection or drainage bilaterally.  Orthopedic  No limitations of motion  feet .  No crepitus or effusions noted.  No bony pathology or digital deformities noted.  Skin  normotropic skin with no porokeratosis noted bilaterally.  No signs of infections or ulcers noted.    Onychomycosis  Pain in right toes  Pain in left toes    Consent was obtained for treatment procedures.   Mechanical debridement of nails 1-5  bilaterally performed with a nail nipper.  Filed with dremel without incident.    Return office visit   3 months                  Told patient to return for periodic foot care and evaluation due to potential at risk complications.   Helane Gunther DPM

## 2022-07-09 NOTE — Progress Notes (Unsigned)
[name] [address] [place] [address] Phone: [phone] Subjective:   Bruce Donath, am serving as a scribe for Dr. Antoine Primas.  I'm seeing this patient by the request  of:  [name], MD  CC: Hand pain  [id number]  Darin [name] is a 89 y.o. male coming in with complaint of L hand trigger finger of 2nd and 3rd and middle finger on R hand. Hx of L ring finger triggering. Injection 11/2021. Patient states that symptoms began 10 days ago.  Starting to affect daily activities.  Affecting and which activities to do and which ones to avoid.       Past Medical History:  Diagnosis Date   Arthritis    DUPUYTREN'S CONTRACTURE, RIGHT 07/04/2009   Annotation: 4th digit Qualifier: Diagnosis of  By: Lovell Sheehan MD, Balinda Quails    GERD (gastroesophageal reflux disease)    H/O hiatal hernia    "FOR YEARS" " NO PROBLEM"   Hyperlipidemia    Hypertension    Knee pain    Neuropathy    PERIPHERAL    Osteoporosis    PMR (polymyalgia rheumatica)    Polymyalgia 5 YEARS   Past Surgical History:  Procedure Laterality Date   CARPAL TUNNEL RELEASE     CATARACT EXTRACTION     right   COLONOSCOPY  2007   INGUINAL HERNIA REPAIR  10/14/2011   Procedure: HERNIA REPAIR INGUINAL ADULT;  Surgeon: Shelly Rubenstein, MD;  Location: [facility] OR;  Service: General;  Laterality: Right;  Right Inguinal Hernia Repair with Mesh   TIBIA IM NAIL INSERTION Right 05/22/2021   Procedure: INTRAMEDULLARY (IM) NAIL TIBIAL;  Surgeon: Myrene Galas, MD;  Location: [facility] OR;  Service: Orthopedics;  Laterality: Right;   TONSILLECTOMY     VARICOSE VEIN SURGERY     Social History   Socioeconomic History   Marital status: Widowed    Spouse name: Not on file   Number of children: Not on file   Years of education: Not on file   Highest education level: Master's degree (e.g., MA, MS, MEng, MEd, MSW, MBA)  Occupational History   Occupation: retired    Associate Professor: RETIRED   Tobacco Use   Smoking status: Never   Smokeless tobacco: Never  Vaping Use   Vaping Use: Never used  Substance and Sexual Activity   Alcohol use: Not Currently    Alcohol/week: 3.0 standard drinks of alcohol    Types: 3 Glasses of wine per week    Comment: half glass wine before dinner   Drug use: No   Sexual activity: Not Currently  Other Topics Concern   Not on file  Social History Narrative   Not on file   Social Determinants of Health   Financial Resource Strain: Low Risk  (05/17/2022)   Overall Financial Resource Strain (CARDIA)    Difficulty of Paying Living Expenses: Not very hard  Food Insecurity: No Food Insecurity (05/17/2022)   Hunger Vital Sign    Worried About Running Out of Food in the Last Year: Never true    Ran Out of Food in the Last Year: Never true  Transportation Needs: No Transportation Needs (01/02/2022)   PRAPARE - Administrator, Civil Service (Medical): No    Lack of Transportation (Non-Medical): No  Physical Activity: Insufficiently Active (11/19/2021)   Exercise Vital Sign    Days of Exercise per Week: 7 days    Minutes of Exercise per Session: 20 min  Stress: No Stress Concern Present (11/19/2021)   [employer] of Occupational Health - Occupational Stress Questionnaire    Feeling of Stress : Not at all  Social Connections: Moderately Integrated (11/19/2021)   Social Connection and Isolation Panel [NHANES]    Frequency of Communication with Friends and Family: Twice a week    Frequency of Social Gatherings with Friends and Family: Once a week    Attends Religious Services: More than 4 times per year    Active Member of [employer] or Organizations: Yes    Attends Banker Meetings: More than 4 times per year    Marital Status: Widowed   Allergies  Allergen Reactions   Statins Other (See Comments)    Muscle pain and upset stomach  Other Reaction(s): leg pain, stoamch   Family History  Problem Relation Age of Onset    Heart disease Father    Coronary artery disease Other      Current Outpatient Medications (Cardiovascular):    amLODipine (NORVASC) 5 MG tablet, TAKE 1 TABLET DAILY   benazepril-hydrochlorthiazide (LOTENSIN HCT) 10-12.5 MG tablet, Take 1 tablet by mouth daily.   Current Outpatient Medications (Analgesics):    meloxicam (MOBIC) 15 MG tablet, TAKE 1 TABLET DAILY   Current Outpatient Medications (Other):    famotidine (PEPCID) 20 MG tablet, TAKE 1 TABLET AT BEDTIME   finasteride (PROSCAR) 5 MG tablet, TAKE ONE TABLET EVERY DAY.   gabapentin (NEURONTIN) 100 MG capsule, TAKE 1 CAPSULE 4 TIMES     DAILY   magnesium oxide (MAG-OX) 400 MG tablet, Take 400 mg by mouth daily.   polyethylene glycol (MIRALAX / GLYCOLAX) packet, Take 17 g by mouth daily as needed for moderate constipation. (Patient taking differently: Take 17 g by mouth 2 (two) times daily.)   tamsulosin (FLOMAX) 0.4 MG CAPS capsule, Take 1 capsule (0.4 mg total) by mouth daily.   vitamin C (ASCORBIC ACID) 500 MG tablet, Take 500 mg by mouth daily.   VITAMIN D PO, Take 2,500 Units by mouth at bedtime.   Reviewed prior external information including notes and imaging from  primary care provider As well as notes that were available from care everywhere and other healthcare systems.  Past medical history, social, surgical and family history all reviewed in electronic medical record.  No pertanent information unless stated regarding to the chief complaint.   Review of Systems:  No headache, visual changes, nausea, vomiting, diarrhea, constipation, dizziness, abdominal pain, skin rash, fevers, chills, night sweats, weight loss, swollen lymph nodes, body aches, joint swelling, chest pain, shortness of breath, mood changes. POSITIVE muscle aches  Objective  There were no vitals taken for this visit.   General: No apparent distress alert and oriented x3 mood and affect normal, dressed appropriately.  HEENT: Pupils equal, extraocular  movements intact  Respiratory: Patient's speak in full sentences and does not appear short of breath  1+ pitting edema of the lower extremities.  Still walking with the aid of a walker.  Right hand exam shows that patient does have a trigger nodule noted at the A2 pulley of the middle finger.  Severe in nature.  Tender to palpation. Left hand also has a trigger nodule is noted at the A2 pulley of the index finger and middle finger.  Procedure: Real-time Ultrasound Guided Injection of right flexor tendon sheath middle finger Device: GE Logiq Q7 Ultrasound guided injection is preferred based studies that show increased duration, increased effect, greater accuracy, decreased procedural pain, increased response rate, and  decreased cost with ultrasound guided versus blind injection.  Verbal informed consent obtained.  Time-out conducted.  Noted no overlying erythema, induration, or other signs of local infection.  Skin prepped in a sterile fashion.  Local anesthesia: Topical Ethyl chloride.  With sterile technique and under real time ultrasound guidance: With a 25-gauge half inch needle injected with 0.5 cc of 0.5% Marcaine and 0.5 cc of Kenalog 40 mg/mL Completed without difficulty  Pain immediately resolved suggesting accurate placement of the medication.  Advised to call if fevers/chills, erythema, induration, drainage, or persistent bleeding.  Impression: Technically successful ultrasound guided injection.  Procedure: Real-time Ultrasound Guided Injection of left flexor tendon sheath index finger Device: GE Logiq Q7 Ultrasound guided injection is preferred based studies that show increased duration, increased effect, greater accuracy, decreased procedural pain, increased response rate, and decreased cost with ultrasound guided versus blind injection.  Verbal informed consent obtained.  Time-out conducted.  Noted no overlying erythema, induration, or other signs of local infection.  Skin  prepped in a sterile fashion.  Local anesthesia: Topical Ethyl chloride.  With sterile technique and under real time ultrasound guidance:  With a 25-gauge half inch needle injected with 0.5 cc of 0.5% Marcaine and 0.5 cc of Kenalog 40 mg/mL Completed without difficulty  Pain immediately resolved suggesting accurate placement of the medication.  Advised to call if fevers/chills, erythema, induration, drainage, or persistent bleeding.  Impression: Technically successful ultrasound guided injection.  Procedure: Real-time Ultrasound Guided Injection of left flexor tendon sheath middle finger Device: GE Logiq Q7 Ultrasound guided injection is preferred based studies that show increased duration, increased effect, greater accuracy, decreased procedural pain, increased response rate, and decreased cost with ultrasound guided versus blind injection.  Verbal informed consent obtained.  Time-out conducted.  Noted no overlying erythema, induration, or other signs of local infection.  Skin prepped in a sterile fashion.  Local anesthesia: Topical Ethyl chloride.  With sterile technique and under real time ultrasound guidance:  With a 25-gauge half inch needle injected with 0.5 cc of 0.5% Marcaine and 0.5 cc of Kenalog 40 mg/mL Completed without difficulty  Pain immediately resolved suggesting accurate placement of the medication.  Advised to call if fevers/chills, erythema, induration, drainage, or persistent bleeding.  Impression: Technically successful ultrasound guided injection.    Impression and Recommendations:     The above documentation has been reviewed and is accurate and complete Judi Saa, DO

## 2022-07-10 ENCOUNTER — Ambulatory Visit (INDEPENDENT_AMBULATORY_CARE_PROVIDER_SITE_OTHER): Payer: Medicare Other | Admitting: Family Medicine

## 2022-07-10 ENCOUNTER — Other Ambulatory Visit: Payer: Self-pay

## 2022-07-10 ENCOUNTER — Encounter: Payer: Self-pay | Admitting: Family Medicine

## 2022-07-10 VITALS — BP 136/70 | HR 86 | Ht 73.0 in | Wt 196.0 lb

## 2022-07-10 DIAGNOSIS — M65331 Trigger finger, right middle finger: Secondary | ICD-10-CM | POA: Diagnosis not present

## 2022-07-10 DIAGNOSIS — M79642 Pain in left hand: Secondary | ICD-10-CM

## 2022-07-10 DIAGNOSIS — M65342 Trigger finger, left ring finger: Secondary | ICD-10-CM

## 2022-07-10 NOTE — Assessment & Plan Note (Signed)
Patient given injection and tolerated the procedure well, discussed icing regimen and home exercises, discussed which activities to do and which ones to avoid, discussed potential bracing at night.  Likely affected secondary to patient needing the walker and gripping it.  Follow-up again in 3 to 4 months

## 2022-07-10 NOTE — Patient Instructions (Addendum)
See me again in 3 months Injected hands

## 2022-07-10 NOTE — Assessment & Plan Note (Signed)
Chronic problem with worsening symptoms.  Given injection again.  Tolerated the procedure well.  Discussed which activities to do and which ones to avoid.  Increase activity slowly but watch the bracing portion as well as the repetitive activities.  Follow-up again in 3 months

## 2022-07-24 ENCOUNTER — Other Ambulatory Visit: Payer: Self-pay | Admitting: Internal Medicine

## 2022-08-06 ENCOUNTER — Other Ambulatory Visit: Payer: Self-pay | Admitting: Family Medicine

## 2022-08-20 ENCOUNTER — Other Ambulatory Visit: Payer: Self-pay | Admitting: Family Medicine

## 2022-08-23 ENCOUNTER — Other Ambulatory Visit: Payer: Self-pay | Admitting: Internal Medicine

## 2022-08-30 ENCOUNTER — Ambulatory Visit (INDEPENDENT_AMBULATORY_CARE_PROVIDER_SITE_OTHER): Payer: Medicare Other

## 2022-08-30 VITALS — Ht 73.0 in | Wt 196.0 lb

## 2022-08-30 DIAGNOSIS — Z0000 Encounter for general adult medical examination without abnormal findings: Secondary | ICD-10-CM | POA: Diagnosis not present

## 2022-08-30 NOTE — Progress Notes (Signed)
Subjective:   Darin [name] is a 89 y.o. male who presents for Medicare Annual/Subsequent preventive examination.  Review of Systems    Virtual Visit via Telephone Note  I connected with  Darin [name] on 08/30/22 at  8:15 AM EDT by telephone and verified that I am speaking with the correct person using two identifiers.  Location: Patient: Home Provider: Office Persons participating in the virtual visit: patient/Nurse Health Advisor   I discussed the limitations, risks, security and privacy concerns of performing an evaluation and management service by telephone and the availability of in person appointments. The patient expressed understanding and agreed to proceed.  Interactive audio and video telecommunications were attempted between this nurse and patient, however failed, due to patient having technical difficulties OR patient did not have access to video capability.  We continued and completed visit with audio only.  Some vital signs may be absent or patient reported.   Tillie Rung, LPN  Cardiac Risk Factors include: advanced age (>47men, >7 women);male gender;hypertension     Objective:    Today's Vitals   08/30/22 0817  Weight: 196 lb (88.9 kg)  Height: 6\' 1"  (1.854 m)   Body mass index is 25.86 kg/m.     08/30/2022    8:25 AM 08/28/2021    1:54 PM 05/20/2021   12:59 PM 06/20/2020    8:17 AM 03/12/2018    8:47 AM 10/20/2017    9:17 AM 03/12/2017   10:34 AM  Advanced Directives  Does Patient Have a Medical Advance Directive? Yes Yes Yes Yes Yes Yes Yes  Type of Estate agent of [place];Living will Healthcare Power of [place];Living will Healthcare Power of [place];Living will Healthcare Power of [place];Living will Healthcare Power of [place];Living will    Does patient want to make changes to medical advance directive? No - Patient declined    No - Patient declined  No - Patient declined  Copy of Healthcare Power of Attorney in  Chart? Yes - validated most recent copy scanned in chart (See row information) Yes - validated most recent copy scanned in chart (See row information)  Yes - validated most recent copy scanned in chart (See row information) Yes - validated most recent copy scanned in chart (See row information)      Current Medications (verified) Outpatient Encounter Medications as of 08/30/2022  Medication Sig   amLODipine (NORVASC) 5 MG tablet TAKE 1 TABLET DAILY   benazepril-hydrochlorthiazide (LOTENSIN HCT) 10-12.5 MG tablet TAKE 1 TABLET DAILY   famotidine (PEPCID) 20 MG tablet TAKE 1 TABLET AT BEDTIME   finasteride (PROSCAR) 5 MG tablet TAKE ONE TABLET BY MOUTH ONCE DAILY.   gabapentin (NEURONTIN) 100 MG capsule TAKE 1 CAPSULE 4 TIMES     DAILY   magnesium oxide (MAG-OX) 400 MG tablet Take 400 mg by mouth daily.   meloxicam (MOBIC) 15 MG tablet TAKE 1 TABLET DAILY   polyethylene glycol (MIRALAX / GLYCOLAX) packet Take 17 g by mouth daily as needed for moderate constipation. (Patient taking differently: Take 17 g by mouth 2 (two) times daily.)   tamsulosin (FLOMAX) 0.4 MG CAPS capsule Take 1 capsule (0.4 mg total) by mouth daily.   vitamin C (ASCORBIC ACID) 500 MG tablet Take 500 mg by mouth daily.   VITAMIN D PO Take 2,500 Units by mouth at bedtime.   No facility-administered encounter medications on file as of 08/30/2022.    Allergies (verified) Statins   History: Past Medical History:  Diagnosis Date  Arthritis    DUPUYTREN'S CONTRACTURE, RIGHT 07/04/2009   Annotation: 4th digit Qualifier: Diagnosis of  By: Lovell Sheehan MD, Balinda Quails    GERD (gastroesophageal reflux disease)    H/O hiatal hernia    "FOR YEARS" " NO PROBLEM"   Hyperlipidemia    Hypertension    Knee pain    Neuropathy    PERIPHERAL    Osteoporosis    PMR (polymyalgia rheumatica) (HCC)    Polymyalgia (HCC) 5 YEARS   Past Surgical History:  Procedure Laterality Date   CARPAL TUNNEL RELEASE     CATARACT EXTRACTION     right    COLONOSCOPY  2007   INGUINAL HERNIA REPAIR  10/14/2011   Procedure: HERNIA REPAIR INGUINAL ADULT;  Surgeon: Shelly Rubenstein, MD;  Location: [facility] OR;  Service: General;  Laterality: Right;  Right Inguinal Hernia Repair with Mesh   TIBIA IM NAIL INSERTION Right 05/22/2021   Procedure: INTRAMEDULLARY (IM) NAIL TIBIAL;  Surgeon: Myrene Galas, MD;  Location: [facility] OR;  Service: Orthopedics;  Laterality: Right;   TONSILLECTOMY     VARICOSE VEIN SURGERY     Family History  Problem Relation Age of Onset   Heart disease Father    Coronary artery disease Other    Social History   Socioeconomic History   Marital status: Widowed    Spouse name: Not on file   Number of children: Not on file   Years of education: Not on file   Highest education level: Master's degree (e.g., MA, MS, MEng, MEd, MSW, MBA)  Occupational History   Occupation: retired    Associate Professor: RETIRED  Tobacco Use   Smoking status: Never   Smokeless tobacco: Never  Vaping Use   Vaping Use: Never used  Substance and Sexual Activity   Alcohol use: Not Currently    Alcohol/week: 3.0 standard drinks of alcohol    Types: 3 Glasses of wine per week    Comment: half glass wine before dinner   Drug use: No   Sexual activity: Not Currently  Other Topics Concern   Not on file  Social History Narrative   Not on file   Social Determinants of Health   Financial Resource Strain: Low Risk  (08/30/2022)   Overall Financial Resource Strain (CARDIA)    Difficulty of Paying Living Expenses: Not hard at all  Food Insecurity: No Food Insecurity (08/30/2022)   Hunger Vital Sign    Worried About Running Out of Food in the Last Year: Never true    Ran Out of Food in the Last Year: Never true  Transportation Needs: No Transportation Needs (08/30/2022)   PRAPARE - Administrator, Civil Service (Medical): No    Lack of Transportation (Non-Medical): No  Physical Activity: Sufficiently Active (08/30/2022)   Exercise Vital Sign    Days of  Exercise per Week: 7 days    Minutes of Exercise per Session: 40 min  Stress: No Stress Concern Present (08/30/2022)   [employer] of Occupational Health - Occupational Stress Questionnaire    Feeling of Stress : Not at all  Social Connections: Moderately Integrated (08/30/2022)   Social Connection and Isolation Panel [NHANES]    Frequency of Communication with Friends and Family: More than three times a week    Frequency of Social Gatherings with Friends and Family: More than three times a week    Attends Religious Services: More than 4 times per year    Active Member of [employer] or Organizations: Yes  Attends Banker Meetings: More than 4 times per year    Marital Status: Widowed    Tobacco Counseling Counseling given: Not Answered   Clinical Intake:  Pre-visit preparation completed: No  Pain : No/denies pain     BMI - recorded: 25.86 Nutritional Status: BMI 25 -29 Overweight Nutritional Risks: None Diabetes: No  How often do you need to have someone help you when you read instructions, pamphlets, or other written materials from your doctor or pharmacy?: 1 - Never  Diabetic? No  Interpreter Needed?: No  Information entered by :: Theresa Mulligan LPN   Activities of Daily Living    08/30/2022    8:23 AM  In your present state of health, do you have any difficulty performing the following activities:  Hearing? 0  Vision? 0  Difficulty concentrating or making decisions? 0  Walking or climbing stairs? 0  Dressing or bathing? 0  Doing errands, shopping? 0  Preparing Food and eating ? N  Using the Toilet? N  In the past six months, have you accidently leaked urine? N  Do you have problems with loss of bowel control? N  Managing your Medications? N  Managing your Finances? N  Housekeeping or managing your Housekeeping? N    Patient Care Team: [name], MD as PCP - General (Family Medicine) Tuchman, Fanny Bien, DPM (Inactive) as Consulting  Physician (Podiatry) [place], Milas Kocher, [facility] (Pharmacist)  Indicate any recent Medical Services you may have received from other than [facility] providers in the past year (date may be approximate).     Assessment:   This is a routine wellness examination for Darin [name].  Hearing/Vision screen Hearing Screening - Comments:: Denies hearing difficulties   Vision Screening - Comments:: Wears rx glasses - up to date with routine eye exams with  Dr Vonna Kotyk  Dietary issues and exercise activities discussed: Current Exercise Habits: Home exercise routine, Type of exercise: walking, Time (Minutes): 40, Frequency (Times/Week): 7, Weekly Exercise (Minutes/Week): 280, Intensity: Moderate, Exercise limited by: None identified   Goals Addressed               This Visit's Progress     Patient stated (pt-stated)        I want to stop using my wheelchair.       Depression Screen    08/30/2022    8:22 AM 08/28/2021    1:56 PM 06/20/2020    8:16 AM 02/25/2020    8:36 AM 10/20/2017    9:24 AM 10/15/2016   11:30 AM 07/19/2016    3:19 PM  PHQ 2/9 Scores  PHQ - 2 Score 0 0 0 0 0 0 0    Fall Risk    08/30/2022    8:24 AM 08/28/2021    1:55 PM 08/13/2021   10:17 AM 06/20/2020    8:18 AM 02/25/2020    8:36 AM  Fall Risk   Falls in the past year? 0 1 1 1  0  Comment  lost balance     Number falls in past yr: 0 1 0 1   Injury with Fall? 0 1 1 0   Comment  broke leg     Risk for fall due to : No Fall Risks Impaired mobility;Impaired balance/gait;Medication side effect  Impaired vision;Impaired balance/gait   Risk for fall due to: Comment    related to walker use   Follow up Falls prevention discussed Falls evaluation completed;Education provided;Falls prevention discussed  Falls prevention discussed  FALL RISK PREVENTION PERTAINING TO THE HOME:  Any stairs in or around the home? No  If so, are there any without handrails? No  Home free of loose throw rugs in walkways, pet beds, electrical cords, etc? Yes   Adequate lighting in your home to reduce risk of falls? Yes   ASSISTIVE DEVICES UTILIZED TO PREVENT FALLS:  Life alert? Yes /Watch Use of a cane, walker or w/c? Yes  Grab bars in the bathroom? Yes  Shower chair or bench in shower? Yes  Elevated toilet seat or a handicapped toilet? No  TIMED UP AND GO:  Was the test performed? No . Audio Visit    Cognitive Function:        08/30/2022    8:25 AM 08/28/2021    1:57 PM 06/20/2020    8:23 AM  6CIT Screen  What Year? 0 points 0 points 0 points  What month? 0 points 0 points 0 points  What time? 0 points 3 points   Count back from 20 0 points 0 points 0 points  Months in reverse 0 points 0 points 0 points  Repeat phrase 0 points 0 points 0 points  Total Score 0 points 3 points     Immunizations Immunization History  Administered Date(s) Administered   Fluad Quad(high Dose 65+) 01/10/2021, 12/28/2021   Hep A / Hep B 06/15/2012, 07/17/2012, 12/18/2012   Influenza Whole 03/26/2003, 01/12/2007   Influenza, High Dose Seasonal PF 01/27/2015, 12/21/2015   Influenza,inj,Quad PF,6+ Mos 03/08/2014   Influenza-Unspecified 12/22/2019   Moderna Covid-19 Vaccine Bivalent Booster 38yrs & up 01/04/2021   PFIZER(Purple Top)SARS-COV-2 Vaccination 04/02/2019, 04/23/2019, 11/26/2019, 07/13/2020   Pneumococcal Conjugate-13 11/16/2013   Pneumococcal Polysaccharide-23 12/12/2009   Td 03/26/2003   Tdap 06/15/2015    TDAP status: Up to date  Flu Vaccine status: Up to date  Pneumococcal vaccine status: Up to date  Covid-19 vaccine status: Completed vaccines  Qualifies for Shingles Vaccine? Yes   Zostavax completed No   Shingrix Completed?: No.    Education has been provided regarding the importance of this vaccine. Patient has been advised to call insurance company to determine out of pocket expense if they have not yet received this vaccine. Advised may also receive vaccine at local pharmacy or Health Dept. Verbalized acceptance and  understanding.  Screening Tests Health Maintenance  Topic Date Due   COVID-19 Vaccine (6 - 2023-24 season) 09/15/2022 (Originally 11/23/2021)   Zoster Vaccines- Shingrix (1 of 2) 11/30/2022 (Originally 09/26/1950)   INFLUENZA VACCINE  10/24/2022   Medicare Annual Wellness (AWV)  08/30/2023   DTaP/Tdap/Td (3 - Td or Tdap) 06/14/2025   Pneumonia Vaccine 62+ Years old  Completed   HPV VACCINES  Aged Out    Health Maintenance  There are no preventive care reminders to display for this patient.   Colorectal cancer screening: No longer required.   Lung Cancer Screening: (Low Dose CT Chest recommended if Age 54-80 years, 30 pack-year currently smoking OR have quit w/in 15years.) does not qualify.    Additional Screening:  Hepatitis C Screening: does not qualify; Completed   Vision Screening: Recommended annual ophthalmology exams for early detection of glaucoma and other disorders of the eye. Is the patient up to date with their annual eye exam?  Yes  Who is the provider or what is the name of the office in which the patient attends annual eye exams? Dr Vonna Kotyk If pt is not established with a provider, would they like to be referred to a  provider to establish care? No .   Dental Screening: Recommended annual dental exams for proper oral hygiene  Community Resource Referral / Chronic Care Management:  CRR required this visit?  No   CCM required this visit?  No      Plan:     I have personally reviewed and noted the following in the patient's chart:   Medical and social history Use of alcohol, tobacco or illicit drugs  Current medications and supplements including opioid prescriptions. Patient is not currently taking opioid prescriptions. Functional ability and status Nutritional status Physical activity Advanced directives List of other physicians Hospitalizations, surgeries, and ER visits in previous 12 months Vitals Screenings to include cognitive, depression, and  falls Referrals and appointments  In addition, I have reviewed and discussed with patient certain preventive protocols, quality metrics, and best practice recommendations. A written personalized care plan for preventive services as well as general preventive health recommendations were provided to patient.     Tillie Rung, LPN   03/30/1094   Nurse Notes:   None

## 2022-08-30 NOTE — Patient Instructions (Addendum)
Darin [name] , Thank you for taking time to come for your Medicare Wellness Visit. I appreciate your ongoing commitment to your health goals. Please review the following plan we discussed and let me know if I can assist you in the future.   These are the goals we discussed:  Goals       <enter goal here> (pt-stated)      Improve my ability to walk by going to the gym.       Exercise 150 min/wk Moderate Activity (pt-stated)      Try to incorporate the PT exercises during the day       Patient Stated      Take care of your self;  Try some of your PT exercises !        Patient Stated      Darin [name] get stronger      Patient Stated      08/28/2021, wants to get back on the walker      Patient stated (pt-stated)      I want to stop using my wheelchair.        This is a list of the screening recommended for you and due dates:  Health Maintenance  Topic Date Due   COVID-19 Vaccine (6 - 2023-24 season) 09/15/2022*   Zoster (Shingles) Vaccine (1 of 2) 11/30/2022*   Flu Shot  10/24/2022   Medicare Annual Wellness Visit  08/30/2023   DTaP/Tdap/Td vaccine (3 - Td or Tdap) 06/14/2025   Pneumonia Vaccine  Completed   HPV Vaccine  Aged Out  *Topic was postponed. The date shown is not the original due date.    Advanced directives: In Chart  Conditions/risks identified: None  Next appointment: Follow up in one year for your annual wellness visit.   Preventive Care 38 Years and Older, Male  Preventive care refers to lifestyle choices and visits with your health care provider that can promote health and wellness. What does preventive care include? A yearly physical exam. This is also called an annual well check. Dental exams once or twice a year. Routine eye exams. Ask your health care provider how often you should have your eyes checked. Personal lifestyle choices, including: Daily care of your teeth and gums. Regular physical activity. Eating a healthy diet. Avoiding tobacco and  drug use. Limiting alcohol use. Practicing safe sex. Taking low doses of aspirin every day. Taking vitamin and mineral supplements as recommended by your health care provider. What happens during an annual well check? The services and screenings done by your health care provider during your annual well check will depend on your age, overall health, lifestyle risk factors, and family history of disease. Counseling  Your health care provider may ask you questions about your: Alcohol use. Tobacco use. Drug use. Emotional well-being. Home and relationship well-being. Sexual activity. Eating habits. History of falls. Memory and ability to understand (cognition). Work and work Astronomer. Screening  You may have the following tests or measurements: Height, weight, and BMI. Blood pressure. Lipid and cholesterol levels. These may be checked every 5 years, or more frequently if you are over 29 years old. Skin check. Lung cancer screening. You may have this screening every year starting at age 91 if you have a 30-pack-year history of smoking and currently smoke or have quit within the past 15 years. Fecal occult blood test (FOBT) of the stool. You may have this test every year starting at age 70. Flexible sigmoidoscopy or colonoscopy. You may have  a sigmoidoscopy every 5 years or a colonoscopy every 10 years starting at age 14. Prostate cancer screening. Recommendations will vary depending on your family history and other risks. Hepatitis C blood test. Hepatitis B blood test. Sexually transmitted disease (STD) testing. Diabetes screening. This is done by checking your blood sugar (glucose) after you have not eaten for a while (fasting). You may have this done every 1-3 years. Abdominal aortic aneurysm (AAA) screening. You may need this if you are a current or former smoker. Osteoporosis. You may be screened starting at age 67 if you are at high risk. Talk with your health care provider  about your test results, treatment options, and if necessary, the need for more tests. Vaccines  Your health care provider may recommend certain vaccines, such as: Influenza vaccine. This is recommended every year. Tetanus, diphtheria, and acellular pertussis (Tdap, Td) vaccine. You may need a Td booster every 10 years. Zoster vaccine. You may need this after age 65. Pneumococcal 13-valent conjugate (PCV13) vaccine. One dose is recommended after age 101. Pneumococcal polysaccharide (PPSV23) vaccine. One dose is recommended after age 25. Talk to your health care provider about which screenings and vaccines you need and how often you need them. This information is not intended to replace advice given to you by your health care provider. Make sure you discuss any questions you have with your health care provider. Document Released: 04/07/2015 Document Revised: 11/29/2015 Document Reviewed: 01/10/2015 Elsevier Interactive Patient Education  2017 [employer].  Fall Prevention in the Home Falls can cause injuries. They can happen to people of all ages. There are many things you can do to make your home safe and to help prevent falls. What can I do on the outside of my home? Regularly fix the edges of walkways and driveways and fix any cracks. Remove anything that might make you trip as you walk through a door, such as a raised step or threshold. Trim any bushes or trees on the path to your home. Use bright outdoor lighting. Clear any walking paths of anything that might make someone trip, such as rocks or tools. Regularly check to see if handrails are loose or broken. Make sure that both sides of any steps have handrails. Any raised decks and porches should have guardrails on the edges. Have any leaves, snow, or ice cleared regularly. Use sand or salt on walking paths during winter. Clean up any spills in your garage right away. This includes oil or grease spills. What can I do in the  bathroom? Use night lights. Install grab bars by the toilet and in the tub and shower. Do not use towel bars as grab bars. Use non-skid mats or decals in the tub or shower. If you need to sit down in the shower, use a plastic, non-slip stool. Keep the floor dry. Clean up any water that spills on the floor as soon as it happens. Remove soap buildup in the tub or shower regularly. Attach bath mats securely with double-sided non-slip rug tape. Do not have throw rugs and other things on the floor that can make you trip. What can I do in the bedroom? Use night lights. Make sure that you have a light by your bed that is easy to reach. Do not use any sheets or blankets that are too big for your bed. They should not hang down onto the floor. Have a firm chair that has side arms. You can use this for support while you get dressed. Do  not have throw rugs and other things on the floor that can make you trip. What can I do in the kitchen? Clean up any spills right away. Avoid walking on wet floors. Keep items that you use a lot in easy-to-reach places. If you need to reach something above you, use a strong step stool that has a grab bar. Keep electrical cords out of the way. Do not use floor polish or wax that makes floors slippery. If you must use wax, use non-skid floor wax. Do not have throw rugs and other things on the floor that can make you trip. What can I do with my stairs? Do not leave any items on the stairs. Make sure that there are handrails on both sides of the stairs and use them. Fix handrails that are broken or loose. Make sure that handrails are as long as the stairways. Check any carpeting to make sure that it is firmly attached to the stairs. Fix any carpet that is loose or worn. Avoid having throw rugs at the top or bottom of the stairs. If you do have throw rugs, attach them to the floor with carpet tape. Make sure that you have a light switch at the top of the stairs and the  bottom of the stairs. If you do not have them, ask someone to add them for you. What else can I do to help prevent falls? Wear shoes that: Do not have high heels. Have rubber bottoms. Are comfortable and fit you well. Are closed at the toe. Do not wear sandals. If you use a stepladder: Make sure that it is fully opened. Do not climb a closed stepladder. Make sure that both sides of the stepladder are locked into place. Ask someone to hold it for you, if possible. Clearly mark and make sure that you can see: Any grab bars or handrails. First and last steps. Where the edge of each step is. Use tools that help you move around (mobility aids) if they are needed. These include: Canes. Walkers. Scooters. Crutches. Turn on the lights when you go into a dark area. Replace any light bulbs as soon as they burn out. Set up your furniture so you have a clear path. Avoid moving your furniture around. If any of your floors are uneven, fix them. If there are any pets around you, be aware of where they are. Review your medicines with your doctor. Some medicines can make you feel dizzy. This can increase your chance of falling. Ask your doctor what other things that you can do to help prevent falls. This information is not intended to replace advice given to you by your health care provider. Make sure you discuss any questions you have with your health care provider. Document Released: 01/05/2009 Document Revised: 08/17/2015 Document Reviewed: 04/15/2014 Elsevier Interactive Patient Education  2017 [employer].

## 2022-09-22 ENCOUNTER — Other Ambulatory Visit: Payer: Self-pay | Admitting: Internal Medicine

## 2022-09-23 ENCOUNTER — Telehealth: Payer: Self-pay | Admitting: Family Medicine

## 2022-09-23 NOTE — Telephone Encounter (Signed)
Pt will be here on 10/07/22.

## 2022-09-23 NOTE — Telephone Encounter (Signed)
Pt would like to come in this week to get the wax removed from his ear.  MD does not have a 30 minute slot for him this entire month.  Pt stated he does not want to see anyone else.   Is something wrong with MD's schedule, or is this intentional ?  Please advise.

## 2022-09-23 NOTE — Telephone Encounter (Signed)
Noted  

## 2022-09-30 NOTE — Telephone Encounter (Signed)
Home number busy X2.  Information below added to Rx.

## 2022-10-03 DIAGNOSIS — L281 Prurigo nodularis: Secondary | ICD-10-CM | POA: Diagnosis not present

## 2022-10-03 DIAGNOSIS — D485 Neoplasm of uncertain behavior of skin: Secondary | ICD-10-CM | POA: Diagnosis not present

## 2022-10-03 DIAGNOSIS — L308 Other specified dermatitis: Secondary | ICD-10-CM | POA: Diagnosis not present

## 2022-10-03 DIAGNOSIS — L820 Inflamed seborrheic keratosis: Secondary | ICD-10-CM | POA: Diagnosis not present

## 2022-10-03 DIAGNOSIS — Z85828 Personal history of other malignant neoplasm of skin: Secondary | ICD-10-CM | POA: Diagnosis not present

## 2022-10-07 ENCOUNTER — Ambulatory Visit (INDEPENDENT_AMBULATORY_CARE_PROVIDER_SITE_OTHER): Payer: Medicare Other | Admitting: Family Medicine

## 2022-10-07 ENCOUNTER — Encounter: Payer: Self-pay | Admitting: Family Medicine

## 2022-10-07 VITALS — BP 118/58 | HR 108 | Temp 97.6°F | Ht 73.0 in | Wt 196.8 lb

## 2022-10-07 DIAGNOSIS — E871 Hypo-osmolality and hyponatremia: Secondary | ICD-10-CM

## 2022-10-07 DIAGNOSIS — H6123 Impacted cerumen, bilateral: Secondary | ICD-10-CM | POA: Diagnosis not present

## 2022-10-07 DIAGNOSIS — N400 Enlarged prostate without lower urinary tract symptoms: Secondary | ICD-10-CM | POA: Diagnosis not present

## 2022-10-07 DIAGNOSIS — D649 Anemia, unspecified: Secondary | ICD-10-CM

## 2022-10-07 DIAGNOSIS — R739 Hyperglycemia, unspecified: Secondary | ICD-10-CM | POA: Diagnosis not present

## 2022-10-07 DIAGNOSIS — R7989 Other specified abnormal findings of blood chemistry: Secondary | ICD-10-CM

## 2022-10-07 DIAGNOSIS — I10 Essential (primary) hypertension: Secondary | ICD-10-CM | POA: Diagnosis not present

## 2022-10-07 LAB — CBC WITH DIFFERENTIAL/PLATELET
Basophils Absolute: 0.1 10*3/uL (ref 0.0–0.1)
Basophils Relative: 0.7 % (ref 0.0–3.0)
Eosinophils Absolute: 0.1 10*3/uL (ref 0.0–0.7)
Eosinophils Relative: 1.4 % (ref 0.0–5.0)
HCT: 39.8 % (ref 39.0–52.0)
Hemoglobin: 13.2 g/dL (ref 13.0–17.0)
Lymphocytes Relative: 14.5 % (ref 12.0–46.0)
Lymphs Abs: 1.2 10*3/uL (ref 0.7–4.0)
MCHC: 33.1 g/dL (ref 30.0–36.0)
MCV: 95.5 fl (ref 78.0–100.0)
Monocytes Absolute: 1 10*3/uL (ref 0.1–1.0)
Monocytes Relative: 12.2 % — ABNORMAL HIGH (ref 3.0–12.0)
Neutro Abs: 6 10*3/uL (ref 1.4–7.7)
Neutrophils Relative %: 71.2 % (ref 43.0–77.0)
Platelets: 356 10*3/uL (ref 150.0–400.0)
RBC: 4.17 Mil/uL — ABNORMAL LOW (ref 4.22–5.81)
RDW: 13.2 % (ref 11.5–15.5)
WBC: 8.5 10*3/uL (ref 4.0–10.5)

## 2022-10-07 LAB — BASIC METABOLIC PANEL
BUN: 15 mg/dL (ref 6–23)
CO2: 28 mEq/L (ref 19–32)
Calcium: 10.6 mg/dL — ABNORMAL HIGH (ref 8.4–10.5)
Chloride: 93 mEq/L — ABNORMAL LOW (ref 96–112)
Creatinine, Ser: 0.77 mg/dL (ref 0.40–1.50)
GFR: 79.64 mL/min (ref >60.00)
Glucose, Bld: 107 mg/dL — ABNORMAL HIGH (ref 70–99)
Potassium: 4.8 mEq/L (ref 3.5–5.1)
Sodium: 129 mEq/L — ABNORMAL LOW (ref 135–145)

## 2022-10-07 LAB — HEMOGLOBIN A1C: Hgb A1c MFr Bld: 6 % (ref 4.6–6.5)

## 2022-10-07 NOTE — Progress Notes (Signed)
Established Patient Office Visit  Subjective   Patient ID: Darin [name], male    DOB: [birth year]  Age: 89 y.o. MRN: [id number]  Chief Complaint  Patient presents with   Cerumen Impaction    HPI   Darin [name] is seen today for medical follow-up and bilateral ear fullness.  He states both ears have been "clogged "for past several weeks.  History of cerumen impaction in the past.  No ear drainage.  No ear pain.  He has BPH treated with Flomax and finasteride.  Usually gets up 2-3 times at night and sometimes has difficulty initiating stream but overall feels like he is emptying his bladder well and no progressive difficulties  Hypertension treated with benazepril HCTZ and amlodipine.  Blood pressures been stable.  No recent orthostatic symptoms.  He has history of prediabetes range blood sugars.  Last A1c was 5.7%.  No polydipsia or polyuria.  He has history of anemia by most recent labs a year ago but this was following orthopedic surgery.  Past Medical History:  Diagnosis Date   Arthritis    DUPUYTREN'S CONTRACTURE, RIGHT 07/04/2009   Annotation: 4th digit Qualifier: Diagnosis of  By: Lovell Sheehan MD, Balinda Quails    GERD (gastroesophageal reflux disease)    H/O hiatal hernia    "FOR YEARS" " NO PROBLEM"   Hyperlipidemia    Hypertension    Knee pain    Neuropathy    PERIPHERAL    Osteoporosis    PMR (polymyalgia rheumatica) (HCC)    Polymyalgia (HCC) 5 YEARS   Past Surgical History:  Procedure Laterality Date   CARPAL TUNNEL RELEASE     CATARACT EXTRACTION     right   COLONOSCOPY  2007   INGUINAL HERNIA REPAIR  10/14/2011   Procedure: HERNIA REPAIR INGUINAL ADULT;  Surgeon: Shelly Rubenstein, MD;  Location: [facility] OR;  Service: General;  Laterality: Right;  Right Inguinal Hernia Repair with Mesh   TIBIA IM NAIL INSERTION Right 05/22/2021   Procedure: INTRAMEDULLARY (IM) NAIL TIBIAL;  Surgeon: Myrene Galas, MD;  Location: [facility] OR;  Service: Orthopedics;  Laterality: Right;    TONSILLECTOMY     VARICOSE VEIN SURGERY      reports that he has never smoked. He has never used smokeless tobacco. He reports that he does not currently use alcohol after a past usage of about 3.0 standard drinks of alcohol per week. He reports that he does not use drugs. family history includes Coronary artery disease in an other family member; Heart disease in his father. Allergies  Allergen Reactions   Statins Other (See Comments)    Muscle pain and upset stomach  Other Reaction(s): leg pain, stoamch    Review of Systems  Constitutional:  Negative for weight loss.  Eyes:  Negative for blurred vision.  Respiratory:  Negative for shortness of breath.   Cardiovascular:  Negative for chest pain.  Neurological:  Negative for dizziness, weakness and headaches.      Objective:     BP (!) 118/58 (BP Location: Left Arm, Cuff Size: Normal)   Pulse (!) 108   Temp 97.6 F (36.4 C) (Oral)   Ht 6\' 1"  (1.854 m)   Wt 196 lb 12.8 oz (89.3 kg)   SpO2 95%   BMI 25.96 kg/m  BP Readings from Last 3 Encounters:  10/07/22 (!) 118/58  07/10/22 136/70  11/28/21 (!) 134/56   Wt Readings from Last 3 Encounters:  10/07/22 196 lb 12.8 oz (89.3 kg)  08/30/22 196  lb (88.9 kg)  07/10/22 196 lb (88.9 kg)      Physical Exam Constitutional:      Appearance: He is well-developed.  HENT:     Ears:     Comments: He has bilateral cerumen impactions Eyes:     Pupils: Pupils are equal, round, and reactive to light.  Neck:     Thyroid: No thyromegaly.  Cardiovascular:     Rate and Rhythm: Normal rate and regular rhythm.  Pulmonary:     Effort: Pulmonary effort is normal. No respiratory distress.     Breath sounds: Normal breath sounds. No wheezing or rales.  Musculoskeletal:     Cervical back: Neck supple.  Neurological:     Mental Status: He is alert and oriented to person, place, and time.      No results found for any visits on 10/07/22.  Last CBC Lab Results  Component Value  Date   WBC 6.5 05/24/2021   HGB 9.5 (L) 05/24/2021   HCT 28.0 (L) 05/24/2021   MCV 93.6 05/24/2021   MCH 31.8 05/24/2021   RDW 13.2 05/24/2021   PLT 156 05/24/2021   Last metabolic panel Lab Results  Component Value Date   GLUCOSE 118 (H) 05/24/2021   NA 137 05/24/2021   K 3.6 05/24/2021   CL 103 05/24/2021   CO2 26 05/24/2021   BUN 18 05/24/2021   CREATININE 0.79 05/24/2021   GFRNONAA >60 05/24/2021   CALCIUM 8.6 (L) 05/24/2021   PHOS 2.0 (L) 03/29/2012   PROT 5.9 (L) 05/21/2021   ALBUMIN 2.9 (L) 05/21/2021   BILITOT 0.7 05/21/2021   ALKPHOS 41 05/21/2021   AST 25 05/21/2021   ALT 22 05/21/2021   ANIONGAP 8 05/24/2021   Last lipids Lab Results  Component Value Date   CHOL 211 (H) 05/22/2021   HDL 73 05/22/2021   LDLCALC 125 (H) 05/22/2021   LDLDIRECT 148.5 12/12/2009   TRIG 67 05/22/2021   CHOLHDL 2.9 05/22/2021   Last hemoglobin A1c Lab Results  Component Value Date   HGBA1C 5.7 (H) 05/23/2021      The ASCVD Risk score (Arnett DK, et al., 2019) failed to calculate for the following reasons:   The 2019 ASCVD risk score is only valid for ages 66 to 68    Assessment & Plan:   #1 hypertension.  Initial blood pressure up slightly but improved after rest and well-controlled.  Check basic metabolic panel.  Continue Lotensin HCTZ and amlodipine.  #2 history of prediabetes range blood sugars.  Recheck A1c today.  Continue low glycemic diet  #3 bilateral cerumen impaction.  This is affecting hearing.  Patient requesting irrigation.  He is aware of risk of irrigation including pain, bleeding, low risk of eardrum perforation.  Patient consents.  Both ears are irrigated with cerumen removal and patient tolerated well  #4 anemia by labs over a year ago.  Probably postoperative related to orthopedic surgery.  Recheck CBC.  #5 BPH stable on finasteride and Flomax.  Continue current regimen   No follow-ups on file.    Evelena Peat, MD

## 2022-10-08 NOTE — Progress Notes (Unsigned)
[name] [address] [place] [address] Phone: [phone] Subjective:   Darin [name], am serving as a scribe for Dr. Antoine Primas.  I'm seeing this patient by the request  of:  [name], MD  CC: Back pain and finger pain.  [id number]  07/10/2022 Chronic problem with worsening symptoms.  Given injection again.  Tolerated the procedure well.  Discussed which activities to do and which ones to avoid.  Increase activity slowly but watch the bracing portion as well as the repetitive activities.  Follow-up again in 3 months     Patient given injection and tolerated the procedure well, discussed icing regimen and home exercises, discussed which activities to do and which ones to avoid, discussed potential bracing at night.  Likely affected secondary to patient needing the walker and gripping it.  Follow-up again in 3 to 4 months     Update 10/09/2022 Darin [name] is a 89 y.o. male coming in with complaint of B hand trigger fingers. Patient states that L hand ring finger is swollen and he is unable to bend DIP. Does not notice any sticking of the finger.   Also c/o L hip pain in the SI joint that was worse than today but seems to be improving but not resolved. Activity level is the same as previous visits.       Past Medical History:  Diagnosis Date   Arthritis    DUPUYTREN'S CONTRACTURE, RIGHT 07/04/2009   Annotation: 4th digit Qualifier: Diagnosis of  By: Lovell Sheehan MD, Balinda Quails    GERD (gastroesophageal reflux disease)    H/O hiatal hernia    "FOR YEARS" " NO PROBLEM"   Hyperlipidemia    Hypertension    Knee pain    Neuropathy    PERIPHERAL    Osteoporosis    PMR (polymyalgia rheumatica) (HCC)    Polymyalgia (HCC) 5 YEARS   Past Surgical History:  Procedure Laterality Date   CARPAL TUNNEL RELEASE     CATARACT EXTRACTION     right   COLONOSCOPY  2007   INGUINAL HERNIA REPAIR  10/14/2011   Procedure: HERNIA REPAIR  INGUINAL ADULT;  Surgeon: Shelly Rubenstein, MD;  Location: [facility] OR;  Service: General;  Laterality: Right;  Right Inguinal Hernia Repair with Mesh   TIBIA IM NAIL INSERTION Right 05/22/2021   Procedure: INTRAMEDULLARY (IM) NAIL TIBIAL;  Surgeon: Myrene Galas, MD;  Location: [facility] OR;  Service: Orthopedics;  Laterality: Right;   TONSILLECTOMY     VARICOSE VEIN SURGERY     Social History   Socioeconomic History   Marital status: Widowed    Spouse name: Not on file   Number of children: Not on file   Years of education: Not on file   Highest education level: Master's degree (e.g., MA, MS, MEng, MEd, MSW, MBA)  Occupational History   Occupation: retired    Associate Professor: RETIRED  Tobacco Use   Smoking status: Never   Smokeless tobacco: Never  Vaping Use   Vaping status: Never Used  Substance and Sexual Activity   Alcohol use: Not Currently    Alcohol/week: 3.0 standard drinks of alcohol    Types: 3 Glasses of wine per week    Comment: half glass wine before dinner   Drug use: No   Sexual activity: Not Currently  Other Topics Concern   Not on file  Social History Narrative   Not on file   Social Determinants of Health   Financial  Resource Strain: Low Risk  (08/30/2022)   Overall Financial Resource Strain (CARDIA)    Difficulty of Paying Living Expenses: Not hard at all  Food Insecurity: No Food Insecurity (08/30/2022)   Hunger Vital Sign    Worried About Running Out of Food in the Last Year: Never true    Ran Out of Food in the Last Year: Never true  Transportation Needs: No Transportation Needs (08/30/2022)   PRAPARE - Administrator, Civil Service (Medical): No    Lack of Transportation (Non-Medical): No  Physical Activity: Sufficiently Active (08/30/2022)   Exercise Vital Sign    Days of Exercise per Week: 7 days    Minutes of Exercise per Session: 40 min  Stress: No Stress Concern Present (08/30/2022)   [employer] of Occupational Health - Occupational Stress  Questionnaire    Feeling of Stress : Not at all  Social Connections: Moderately Integrated (08/30/2022)   Social Connection and Isolation Panel [NHANES]    Frequency of Communication with Friends and Family: More than three times a week    Frequency of Social Gatherings with Friends and Family: More than three times a week    Attends Religious Services: More than 4 times per year    Active Member of [employer] or Organizations: Yes    Attends Banker Meetings: More than 4 times per year    Marital Status: Widowed   Allergies  Allergen Reactions   Statins Other (See Comments)    Muscle pain and upset stomach  Other Reaction(s): leg pain, stoamch   Family History  Problem Relation Age of Onset   Heart disease Father    Coronary artery disease Other      Current Outpatient Medications (Cardiovascular):    amLODipine (NORVASC) 5 MG tablet, TAKE 1 TABLET DAILY   benazepril-hydrochlorthiazide (LOTENSIN HCT) 10-12.5 MG tablet, TAKE 1 TABLET DAILY   Current Outpatient Medications (Analgesics):    meloxicam (MOBIC) 15 MG tablet, TAKE 1 TABLET DAILY   Current Outpatient Medications (Other):    famotidine (PEPCID) 20 MG tablet, TAKE 1 TABLET AT BEDTIME   finasteride (PROSCAR) 5 MG tablet, TAKE ONE TABLET BY MOUTH ONCE DAILY.   gabapentin (NEURONTIN) 100 MG capsule, TAKE 1 CAPSULE 4 TIMES     DAILY   magnesium oxide (MAG-OX) 400 MG tablet, Take 400 mg by mouth daily.   polyethylene glycol (MIRALAX / GLYCOLAX) packet, Take 17 g by mouth daily as needed for moderate constipation. (Patient taking differently: Take 17 g by mouth 2 (two) times daily.)   tamsulosin (FLOMAX) 0.4 MG CAPS capsule, Take 1 capsule (0.4 mg total) by mouth daily.   vitamin C (ASCORBIC ACID) 500 MG tablet, Take 500 mg by mouth daily.   VITAMIN D PO, Take 2,500 Units by mouth at bedtime.   Reviewed prior external information including notes and imaging from  primary care provider As well as notes that were  available from care everywhere and other healthcare systems.  Past medical history, social, surgical and family history all reviewed in electronic medical record.  No pertanent information unless stated regarding to the chief complaint.   Review of Systems:  No headache, visual changes, nausea, vomiting, diarrhea, constipation, dizziness, abdominal pain, skin rash, fevers, chills, night sweats, weight loss, swollen lymph nodes, body aches, joint swelling, chest pain, shortness of breath, mood changes. POSITIVE muscle aches  Objective  Blood pressure 128/82, pulse 90, height 6\' 1"  (1.854 m), weight 198 lb (89.8 kg), SpO2 98%.  General: No apparent distress alert and oriented x3 mood and affect normal, dressed appropriately.  HEENT: Pupils equal, extraocular movements intact  Patient does have a shuffling gait noted.  Does have a walker noted.  Severe tenderness to palpation over the sacroiliac joint on the left side.  Significant degenerative scoliosis of the lumbar spine. Patient's left hand at the DIP just proximal to this area and does have a trigger nodule noted.  When we do put flexion or pressure in this area patient is able to flex the DIP.  Procedure: Real-time Ultrasound Guided Injection of left flexor tendon sheath at the A1 pulley of the fourth finger Device: GE Logiq Q7 Ultrasound guided injection is preferred based studies that show increased duration, increased effect, greater accuracy, decreased procedural pain, increased response rate, and decreased cost with ultrasound guided versus blind injection.  Verbal informed consent obtained.  Time-out conducted.  Noted no overlying erythema, induration, or other signs of local infection.  Skin prepped in a sterile fashion.  Local anesthesia: Topical Ethyl chloride.  With sterile technique and under real time ultrasound guidance: With a 25-gauge half inch needle injected with 0.5 cc of 0.5% Marcaine and 0.5 cc of Kenalog 40  mg/mL Completed without difficulty  Pain immediately resolved suggesting accurate placement of the medication.  Advised to call if fevers/chills, erythema, induration, drainage, or persistent bleeding.  Impression: Technically successful ultrasound guided injection.   After verbal consent patient was prepped with alcohol swab and with a 21-gauge 2 inch needle injected into the left sacroiliac joint.  Total of 1 cc of 0.5% Marcaine and 1 cc of Kenalog 40 mg.  No blood loss.  Band-Aid placed.  Postinjection instructions given   Impression and Recommendations:    The above documentation has been reviewed and is accurate and complete Judi Saa, DO

## 2022-10-08 NOTE — Addendum Note (Signed)
Addended by: Christy Sartorius on: 10/08/2022 10:37 AM   Modules accepted: Orders

## 2022-10-08 NOTE — Addendum Note (Signed)
Addended by: Christy Sartorius on: 10/08/2022 10:36 AM   Modules accepted: Orders

## 2022-10-08 NOTE — Addendum Note (Signed)
Addended by: Christy Sartorius on: 10/08/2022 10:38 AM   Modules accepted: Orders

## 2022-10-09 ENCOUNTER — Ambulatory Visit (INDEPENDENT_AMBULATORY_CARE_PROVIDER_SITE_OTHER): Payer: Medicare Other | Admitting: Family Medicine

## 2022-10-09 ENCOUNTER — Other Ambulatory Visit: Payer: Self-pay

## 2022-10-09 VITALS — BP 128/82 | HR 90 | Ht 73.0 in | Wt 198.0 lb

## 2022-10-09 DIAGNOSIS — M79642 Pain in left hand: Secondary | ICD-10-CM | POA: Diagnosis not present

## 2022-10-09 DIAGNOSIS — G8929 Other chronic pain: Secondary | ICD-10-CM | POA: Diagnosis not present

## 2022-10-09 DIAGNOSIS — M533 Sacrococcygeal disorders, not elsewhere classified: Secondary | ICD-10-CM | POA: Diagnosis not present

## 2022-10-09 DIAGNOSIS — M79641 Pain in right hand: Secondary | ICD-10-CM

## 2022-10-09 DIAGNOSIS — M65342 Trigger finger, left ring finger: Secondary | ICD-10-CM | POA: Diagnosis not present

## 2022-10-09 NOTE — Assessment & Plan Note (Signed)
Chronic, with exacerbation.  The patient wants to avoid any surgical intervention.  Discussed icing regimen and home exercises, discussed which activities to do and which ones to avoid.  Patient has responded to this previously and hopefully will make significant progress again.  Follow-up again in 3 months

## 2022-10-09 NOTE — Assessment & Plan Note (Signed)
A1 pulley injection given today.  Seem to be more of this being very concerned than the true A2 pulley.  We discussed with patient to avoid certain repetitive activities if possible.  Knows about bracing at night.  Follow-up with me again in 6 to 8 weeks.

## 2022-10-09 NOTE — Patient Instructions (Signed)
Injected finger and SI joint today See me again in 3 months

## 2022-10-14 ENCOUNTER — Other Ambulatory Visit: Payer: Self-pay | Admitting: Family Medicine

## 2022-10-16 ENCOUNTER — Ambulatory Visit (INDEPENDENT_AMBULATORY_CARE_PROVIDER_SITE_OTHER): Payer: Medicare Other | Admitting: Family Medicine

## 2022-10-16 ENCOUNTER — Encounter: Payer: Self-pay | Admitting: Family Medicine

## 2022-10-16 VITALS — BP 138/70 | HR 100 | Resp 16 | Ht 73.0 in

## 2022-10-16 DIAGNOSIS — H9203 Otalgia, bilateral: Secondary | ICD-10-CM

## 2022-10-16 DIAGNOSIS — H60501 Unspecified acute noninfective otitis externa, right ear: Secondary | ICD-10-CM

## 2022-10-16 MED ORDER — CIPROFLOXACIN-DEXAMETHASONE 0.3-0.1 % OT SUSP
4.0000 [drp] | Freq: Two times a day (BID) | OTIC | 0 refills | Status: DC
Start: 2022-10-16 — End: 2022-12-03

## 2022-10-16 NOTE — Progress Notes (Signed)
ACUTE VISIT Chief Complaint  Patient presents with   Ear Pain    Right ear    HPI: Mr.Murlin Brandis is a 89 y.o. male with PMHx significant for hypertension, GERD, chronic back pain, and polyneuropathy here today complaining of right earache and some discomfort in the left ear, which started two to three days ago. He denies any recent cold or travel history. There has been no change in hearing, and no drainage from the ear has been observed. He reports no fever, sore throat, or cough.  He rates the current pain in the right ear as a 4 out of 10, but reports it was an 8 out of 10 earlier in the day.  Otalgia  There is pain in both ears. This is a new problem. The current episode started in the past 7 days. The problem has been unchanged. There has been no fever. The pain is at a severity of 4/10. Pertinent negatives include no abdominal pain, diarrhea, ear discharge, headaches, rash, rhinorrhea or vomiting. He has tried nothing for the symptoms.   He has not been taking any medication for the pain. States that he had his ears cleaned, ear lavage,approximately one to two weeks ago.    Review of Systems  Constitutional:  Negative for activity change, appetite change and chills.  HENT:  Positive for ear pain. Negative for ear discharge, rhinorrhea and tinnitus.   Respiratory:  Negative for shortness of breath and wheezing.   Gastrointestinal:  Negative for abdominal pain, diarrhea, nausea and vomiting.  Skin:  Negative for rash.  Neurological:  Negative for dizziness, syncope and headaches.  Psychiatric/Behavioral:  Negative for confusion.   See other pertinent positives and negatives in HPI.  Current Outpatient Medications on File Prior to Visit  Medication Sig Dispense Refill   amLODipine (NORVASC) 5 MG tablet TAKE 1 TABLET DAILY 90 tablet 3   benazepril-hydrochlorthiazide (LOTENSIN HCT) 10-12.5 MG tablet TAKE 1 TABLET DAILY 90 tablet 1   famotidine (PEPCID) 20 MG tablet TAKE 1  TABLET AT BEDTIME 90 tablet 0   finasteride (PROSCAR) 5 MG tablet TAKE ONE TABLET BY MOUTH ONCE DAILY 90 tablet 0   gabapentin (NEURONTIN) 100 MG capsule TAKE 1 CAPSULE 4 TIMES     DAILY 360 capsule 0   magnesium oxide (MAG-OX) 400 MG tablet Take 400 mg by mouth daily.     meloxicam (MOBIC) 15 MG tablet TAKE 1 TABLET DAILY 30 tablet 0   polyethylene glycol (MIRALAX / GLYCOLAX) packet Take 17 g by mouth daily as needed for moderate constipation. (Patient taking differently: Take 17 g by mouth 2 (two) times daily.)     tamsulosin (FLOMAX) 0.4 MG CAPS capsule Take 1 capsule (0.4 mg total) by mouth daily. 30 capsule 3   vitamin C (ASCORBIC ACID) 500 MG tablet Take 500 mg by mouth daily.     VITAMIN D PO Take 2,500 Units by mouth at bedtime.     No current facility-administered medications on file prior to visit.    Past Medical History:  Diagnosis Date   Arthritis    DUPUYTREN'S CONTRACTURE, RIGHT 07/04/2009   Annotation: 4th digit Qualifier: Diagnosis of  By: Lovell Sheehan MD, Balinda Quails    GERD (gastroesophageal reflux disease)    H/O hiatal hernia    "FOR YEARS" " NO PROBLEM"   Hyperlipidemia    Hypertension    Knee pain    Neuropathy    PERIPHERAL    Osteoporosis    PMR (polymyalgia rheumatica) (HCC)  Polymyalgia (HCC) 5 YEARS   Allergies  Allergen Reactions   Statins Other (See Comments)    Muscle pain and upset stomach  Other Reaction(s): leg pain, stoamch    Social History   Socioeconomic History   Marital status: Widowed    Spouse name: Not on file   Number of children: Not on file   Years of education: Not on file   Highest education level: Master's degree (e.g., MA, MS, MEng, MEd, MSW, MBA)  Occupational History   Occupation: retired    Associate Professor: RETIRED  Tobacco Use   Smoking status: Never   Smokeless tobacco: Never  Vaping Use   Vaping status: Never Used  Substance and Sexual Activity   Alcohol use: Not Currently    Alcohol/week: 3.0 standard drinks of alcohol     Types: 3 Glasses of wine per week    Comment: half glass wine before dinner   Drug use: No   Sexual activity: Not Currently  Other Topics Concern   Not on file  Social History Narrative   Not on file   Social Determinants of Health   Financial Resource Strain: Low Risk  (08/30/2022)   Overall Financial Resource Strain (CARDIA)    Difficulty of Paying Living Expenses: Not hard at all  Food Insecurity: No Food Insecurity (08/30/2022)   Hunger Vital Sign    Worried About Running Out of Food in the Last Year: Never true    Ran Out of Food in the Last Year: Never true  Transportation Needs: No Transportation Needs (08/30/2022)   PRAPARE - Administrator, Civil Service (Medical): No    Lack of Transportation (Non-Medical): No  Physical Activity: Sufficiently Active (08/30/2022)   Exercise Vital Sign    Days of Exercise per Week: 7 days    Minutes of Exercise per Session: 40 min  Stress: No Stress Concern Present (08/30/2022)   [employer] of Occupational Health - Occupational Stress Questionnaire    Feeling of Stress : Not at all  Social Connections: Moderately Integrated (08/30/2022)   Social Connection and Isolation Panel [NHANES]    Frequency of Communication with Friends and Family: More than three times a week    Frequency of Social Gatherings with Friends and Family: More than three times a week    Attends Religious Services: More than 4 times per year    Active Member of [employer] or Organizations: Yes    Attends Banker Meetings: More than 4 times per year    Marital Status: Widowed   Vitals:   10/16/22 1157  BP: 138/70  Pulse: 100  Resp: 16  SpO2: 97%   Body mass index is 26.12 kg/m.  Physical Exam Nursing note reviewed.  Constitutional:      General: He is not in acute distress.    Appearance: He is well-developed.  HENT:     Head: Normocephalic and atraumatic.     Right Ear: Tympanic membrane, ear canal and external ear normal.     Left  Ear: External ear normal.     Ears:     Comments: Left canal cerumen excess, cannot see TM. Mild discomfort upon otoscopic examination of right ear.  There is no erythema or edema of ear canal. Eyes:     Conjunctiva/sclera: Conjunctivae normal.  Cardiovascular:     Rate and Rhythm: Normal rate and regular rhythm. Occasional Extrasystoles are present.    Heart sounds: No murmur heard. Pulmonary:     Effort: Pulmonary effort  is normal. No respiratory distress.     Breath sounds: Normal breath sounds.  Lymphadenopathy:     Head:     Right side of head: Preauricular adenopathy present.     Left side of head: Preauricular adenopathy present.     Cervical: No cervical adenopathy.  Skin:    General: Skin is warm.     Findings: No erythema or rash.  Neurological:     Mental Status: He is alert and oriented to person, place, and time.     Comments: Unstable gait assisted with a walker.  Psychiatric:        Mood and Affect: Mood and affect normal.   ASSESSMENT AND PLAN:  Mr. Dupras was seen today for bilateral earache R>L.  Acute otitis externa of right ear, unspecified type We discussed differential diagnosis. No significant findings on examination today that explain episodes of severe pain. Will treat as infectious external otitis with topical antibiotic. Clearly instructed about warning signs.  -     Ciprofloxacin-dexAMETHasone; Place 4 drops into the right ear 2 (two) times daily.  Dispense: 7.5 mL; Refill: 0  Otalgia of both ears Bilateral, right more than left. Left ear examination negative except for excess cerumen, not impacted. Continue monitoring for new symptoms. Follow-up with PCP as needed.  Return if symptoms worsen or fail to improve.  Ashford Clouse G. [place], MD  [facility]. [place] office.

## 2022-10-16 NOTE — Patient Instructions (Signed)
A few things to remember from today's visit:  Acute otitis externa of right ear, unspecified type - Plan: ciprofloxacin-dexamethasone (CIPRODEX) OTIC suspension  Otalgia of both ears  Could be mainly irritation but will treat as infection. 3-4 drops of antibiotic in right ear for 7-10 days. Monitor for new symptoms.  Do not use My Chart to request refills or for acute issues that need immediate attention. If you send a my chart message, it may take a few days to be addressed, specially if I am not in the office.  Please be sure medication list is accurate. If a new problem present, please set up appointment sooner than planned today.

## 2022-10-17 ENCOUNTER — Other Ambulatory Visit: Payer: Self-pay | Admitting: Family Medicine

## 2022-10-22 ENCOUNTER — Other Ambulatory Visit: Payer: Medicare Other

## 2022-10-22 DIAGNOSIS — R7989 Other specified abnormal findings of blood chemistry: Secondary | ICD-10-CM

## 2022-10-22 LAB — COMPREHENSIVE METABOLIC PANEL
ALT: 15 U/L (ref 0–53)
AST: 17 U/L (ref 0–37)
Albumin: 4.1 g/dL (ref 3.5–5.2)
Alkaline Phosphatase: 89 U/L (ref 39–117)
BUN: 14 mg/dL (ref 6–23)
CO2: 27 mEq/L (ref 19–32)
Calcium: 10.1 mg/dL (ref 8.4–10.5)
Chloride: 88 mEq/L — ABNORMAL LOW (ref 96–112)
Creatinine, Ser: 0.86 mg/dL (ref 0.40–1.50)
GFR: 77.01 mL/min (ref >60.00)
Glucose, Bld: 113 mg/dL — ABNORMAL HIGH (ref 70–99)
Potassium: 4.5 mEq/L (ref 3.5–5.1)
Sodium: 126 mEq/L — ABNORMAL LOW (ref 135–145)
Total Bilirubin: 0.4 mg/dL (ref 0.2–1.2)
Total Protein: 7.2 g/dL (ref 6.0–8.3)

## 2022-10-23 MED ORDER — BENAZEPRIL HCL 20 MG PO TABS: 20.0000 mg | ORAL_TABLET | Freq: Every day | ORAL | 0 refills | Status: DC

## 2022-10-31 ENCOUNTER — Ambulatory Visit (INDEPENDENT_AMBULATORY_CARE_PROVIDER_SITE_OTHER): Payer: Medicare Other | Admitting: Podiatry

## 2022-10-31 ENCOUNTER — Encounter: Payer: Self-pay | Admitting: Podiatry

## 2022-10-31 DIAGNOSIS — M79675 Pain in left toe(s): Secondary | ICD-10-CM

## 2022-10-31 DIAGNOSIS — G609 Hereditary and idiopathic neuropathy, unspecified: Secondary | ICD-10-CM

## 2022-10-31 DIAGNOSIS — B351 Tinea unguium: Secondary | ICD-10-CM | POA: Diagnosis not present

## 2022-10-31 DIAGNOSIS — M79674 Pain in right toe(s): Secondary | ICD-10-CM

## 2022-10-31 NOTE — Progress Notes (Signed)
This patient returns to my office for at risk foot care.  This patient requires this care by a professional since this patient will be at risk due to having idiopathic neuropathy.  This patient is unable to cut nails himself since the patient cannot reach his nails.These nails are painful walking and wearing shoes.  This patient presents for at risk foot care today.  General Appearance  Alert, conversant and in no acute stress.  Vascular  Dorsalis pedis and posterior tibial  pulses are weak/absent   palpable  bilaterally.  Capillary return is within normal limits  Bilaterally.Cold feet.  bilaterally.  Neurologic  Senn-Weinstein monofilament wire test within normal limits  bilaterally. Muscle power within normal limits bilaterally.  Nails Thick disfigured discolored nails with subungual debris  from hallux to fifth toes bilaterally. Pincer nails  B/L. No evidence of bacterial infection or drainage bilaterally.  Orthopedic  No limitations of motion  feet .  No crepitus or effusions noted.  No bony pathology or digital deformities noted.  Skin  normotropic skin with no porokeratosis noted bilaterally.  No signs of infections or ulcers noted.    Onychomycosis  Pain in right toes  Pain in left toes    Consent was obtained for treatment procedures.   Mechanical debridement of nails 1-5  bilaterally performed with a nail nipper.  Filed with dremel without incident.    Return office visit   3 months                  Told patient to return for periodic foot care and evaluation due to potential at risk complications.   Helane Gunther DPM

## 2022-11-11 ENCOUNTER — Other Ambulatory Visit: Payer: Self-pay | Admitting: Family Medicine

## 2022-12-03 ENCOUNTER — Inpatient Hospital Stay (HOSPITAL_COMMUNITY)
Admission: EM | Admit: 2022-12-03 | Discharge: 2022-12-06 | DRG: 280 | Disposition: A | Discharge status: Hospice | Payer: Medicare Other | Attending: Student | Admitting: Student

## 2022-12-03 ENCOUNTER — Emergency Department (HOSPITAL_COMMUNITY): Payer: Medicare Other

## 2022-12-03 ENCOUNTER — Other Ambulatory Visit: Payer: Self-pay

## 2022-12-03 ENCOUNTER — Emergency Department (HOSPITAL_COMMUNITY)
Admit: 2022-12-03 | Discharge: 2022-12-03 | Disposition: A | Discharge status: Home / Self Care | Payer: Medicare Other | Attending: Emergency Medicine | Admitting: Emergency Medicine

## 2022-12-03 ENCOUNTER — Inpatient Hospital Stay (HOSPITAL_COMMUNITY): Payer: Medicare Other

## 2022-12-03 ENCOUNTER — Encounter (HOSPITAL_COMMUNITY): Payer: Self-pay

## 2022-12-03 DIAGNOSIS — R0602 Shortness of breath: Secondary | ICD-10-CM | POA: Diagnosis not present

## 2022-12-03 DIAGNOSIS — R0600 Dyspnea, unspecified: Secondary | ICD-10-CM | POA: Diagnosis not present

## 2022-12-03 DIAGNOSIS — Z888 Allergy status to other drugs, medicaments and biological substances status: Secondary | ICD-10-CM | POA: Diagnosis not present

## 2022-12-03 DIAGNOSIS — E785 Hyperlipidemia, unspecified: Secondary | ICD-10-CM | POA: Diagnosis present

## 2022-12-03 DIAGNOSIS — J810 Acute pulmonary edema: Secondary | ICD-10-CM | POA: Diagnosis not present

## 2022-12-03 DIAGNOSIS — R911 Solitary pulmonary nodule: Secondary | ICD-10-CM | POA: Diagnosis not present

## 2022-12-03 DIAGNOSIS — N400 Enlarged prostate without lower urinary tract symptoms: Secondary | ICD-10-CM | POA: Diagnosis present

## 2022-12-03 DIAGNOSIS — I5023 Acute on chronic systolic (congestive) heart failure: Secondary | ICD-10-CM | POA: Diagnosis present

## 2022-12-03 DIAGNOSIS — Z9841 Cataract extraction status, right eye: Secondary | ICD-10-CM | POA: Diagnosis not present

## 2022-12-03 DIAGNOSIS — J9601 Acute respiratory failure with hypoxia: Secondary | ICD-10-CM | POA: Diagnosis not present

## 2022-12-03 DIAGNOSIS — Z7401 Bed confinement status: Secondary | ICD-10-CM | POA: Diagnosis not present

## 2022-12-03 DIAGNOSIS — I214 Non-ST elevation (NSTEMI) myocardial infarction: Secondary | ICD-10-CM | POA: Diagnosis not present

## 2022-12-03 DIAGNOSIS — I509 Heart failure, unspecified: Secondary | ICD-10-CM | POA: Diagnosis not present

## 2022-12-03 DIAGNOSIS — D649 Anemia, unspecified: Secondary | ICD-10-CM | POA: Diagnosis present

## 2022-12-03 DIAGNOSIS — Z8249 Family history of ischemic heart disease and other diseases of the circulatory system: Secondary | ICD-10-CM | POA: Diagnosis not present

## 2022-12-03 DIAGNOSIS — R609 Edema, unspecified: Secondary | ICD-10-CM | POA: Diagnosis not present

## 2022-12-03 DIAGNOSIS — I110 Hypertensive heart disease with heart failure: Secondary | ICD-10-CM | POA: Diagnosis not present

## 2022-12-03 DIAGNOSIS — K219 Gastro-esophageal reflux disease without esophagitis: Secondary | ICD-10-CM | POA: Diagnosis present

## 2022-12-03 DIAGNOSIS — I5043 Acute on chronic combined systolic (congestive) and diastolic (congestive) heart failure: Secondary | ICD-10-CM | POA: Diagnosis not present

## 2022-12-03 DIAGNOSIS — R5383 Other fatigue: Secondary | ICD-10-CM | POA: Diagnosis not present

## 2022-12-03 DIAGNOSIS — M7989 Other specified soft tissue disorders: Secondary | ICD-10-CM

## 2022-12-03 DIAGNOSIS — R7989 Other specified abnormal findings of blood chemistry: Secondary | ICD-10-CM

## 2022-12-03 DIAGNOSIS — M353 Polymyalgia rheumatica: Secondary | ICD-10-CM | POA: Diagnosis not present

## 2022-12-03 DIAGNOSIS — J9811 Atelectasis: Secondary | ICD-10-CM | POA: Diagnosis not present

## 2022-12-03 DIAGNOSIS — E7849 Other hyperlipidemia: Secondary | ICD-10-CM | POA: Diagnosis not present

## 2022-12-03 DIAGNOSIS — Z79899 Other long term (current) drug therapy: Secondary | ICD-10-CM | POA: Diagnosis not present

## 2022-12-03 DIAGNOSIS — Z1152 Encounter for screening for COVID-19: Secondary | ICD-10-CM | POA: Diagnosis not present

## 2022-12-03 DIAGNOSIS — I517 Cardiomegaly: Secondary | ICD-10-CM | POA: Diagnosis not present

## 2022-12-03 DIAGNOSIS — I2102 ST elevation (STEMI) myocardial infarction involving left anterior descending coronary artery: Secondary | ICD-10-CM | POA: Diagnosis not present

## 2022-12-03 DIAGNOSIS — Z515 Encounter for palliative care: Secondary | ICD-10-CM

## 2022-12-03 DIAGNOSIS — E871 Hypo-osmolality and hyponatremia: Secondary | ICD-10-CM | POA: Diagnosis not present

## 2022-12-03 DIAGNOSIS — R739 Hyperglycemia, unspecified: Secondary | ICD-10-CM | POA: Diagnosis not present

## 2022-12-03 DIAGNOSIS — R069 Unspecified abnormalities of breathing: Secondary | ICD-10-CM | POA: Diagnosis not present

## 2022-12-03 DIAGNOSIS — R918 Other nonspecific abnormal finding of lung field: Secondary | ICD-10-CM | POA: Diagnosis not present

## 2022-12-03 DIAGNOSIS — R5381 Other malaise: Secondary | ICD-10-CM | POA: Diagnosis not present

## 2022-12-03 DIAGNOSIS — Z66 Do not resuscitate: Secondary | ICD-10-CM | POA: Diagnosis not present

## 2022-12-03 DIAGNOSIS — Z5329 Procedure and treatment not carried out because of patient's decision for other reasons: Secondary | ICD-10-CM | POA: Diagnosis present

## 2022-12-03 DIAGNOSIS — G629 Polyneuropathy, unspecified: Secondary | ICD-10-CM | POA: Diagnosis present

## 2022-12-03 DIAGNOSIS — R410 Disorientation, unspecified: Secondary | ICD-10-CM | POA: Diagnosis not present

## 2022-12-03 DIAGNOSIS — R062 Wheezing: Secondary | ICD-10-CM | POA: Diagnosis not present

## 2022-12-03 DIAGNOSIS — J90 Pleural effusion, not elsewhere classified: Secondary | ICD-10-CM | POA: Diagnosis not present

## 2022-12-03 DIAGNOSIS — J811 Chronic pulmonary edema: Secondary | ICD-10-CM | POA: Diagnosis not present

## 2022-12-03 DIAGNOSIS — Z791 Long term (current) use of non-steroidal anti-inflammatories (NSAID): Secondary | ICD-10-CM

## 2022-12-03 DIAGNOSIS — I5021 Acute systolic (congestive) heart failure: Secondary | ICD-10-CM | POA: Diagnosis not present

## 2022-12-03 DIAGNOSIS — I10 Essential (primary) hypertension: Secondary | ICD-10-CM | POA: Diagnosis not present

## 2022-12-03 DIAGNOSIS — I700 Atherosclerosis of aorta: Secondary | ICD-10-CM | POA: Diagnosis not present

## 2022-12-03 DIAGNOSIS — R0902 Hypoxemia: Secondary | ICD-10-CM | POA: Diagnosis not present

## 2022-12-03 LAB — I-STAT VENOUS BLOOD GAS, ED
Acid-base deficit: 1 mmol/L (ref 0.0–2.0)
Bicarbonate: 23.2 mmol/L (ref 20.0–28.0)
Calcium, Ion: 1.17 mmol/L (ref 1.15–1.40)
HCT: 33 % — ABNORMAL LOW (ref 39.0–52.0)
Hemoglobin: 11.2 g/dL — ABNORMAL LOW (ref 13.0–17.0)
O2 Saturation: 79 %
Potassium: 4.7 mmol/L (ref 3.5–5.1)
Sodium: 130 mmol/L — ABNORMAL LOW (ref 135–145)
TCO2: 24 mmol/L (ref 22–32)
pCO2, Ven: 37.7 mmHg — ABNORMAL LOW (ref 44–60)
pH, Ven: 7.397 (ref 7.25–7.43)
pO2, Ven: 44 mmHg (ref 32–45)

## 2022-12-03 LAB — COMPREHENSIVE METABOLIC PANEL
ALT: 18 U/L (ref 0–44)
AST: 21 U/L (ref 15–41)
Albumin: 2.8 g/dL — ABNORMAL LOW (ref 3.5–5.0)
Alkaline Phosphatase: 74 U/L (ref 38–126)
Anion gap: 14 (ref 5–15)
BUN: 20 mg/dL (ref 8–23)
CO2: 20 mmol/L — ABNORMAL LOW (ref 22–32)
Calcium: 9.2 mg/dL (ref 8.9–10.3)
Chloride: 96 mmol/L — ABNORMAL LOW (ref 98–111)
Creatinine, Ser: 0.93 mg/dL (ref 0.61–1.24)
GFR, Estimated: >60 mL/min (ref >60)
Glucose, Bld: 175 mg/dL — ABNORMAL HIGH (ref 70–99)
Potassium: 4.6 mmol/L (ref 3.5–5.1)
Sodium: 130 mmol/L — ABNORMAL LOW (ref 135–145)
Total Bilirubin: 0.9 mg/dL (ref 0.3–1.2)
Total Protein: 5.9 g/dL — ABNORMAL LOW (ref 6.5–8.1)

## 2022-12-03 LAB — CBC
HCT: 34.1 % — ABNORMAL LOW (ref 39.0–52.0)
Hemoglobin: 10.9 g/dL — ABNORMAL LOW (ref 13.0–17.0)
MCH: 30.8 pg (ref 26.0–34.0)
MCHC: 32 g/dL (ref 30.0–36.0)
MCV: 96.3 fL (ref 80.0–100.0)
Platelets: 297 10*3/uL (ref 150–400)
RBC: 3.54 MIL/uL — ABNORMAL LOW (ref 4.22–5.81)
RDW: 12.8 % (ref 11.5–15.5)
WBC: 10.4 10*3/uL (ref 4.0–10.5)
nRBC: 0 % (ref 0.0–0.2)

## 2022-12-03 LAB — RESP PANEL BY RT-PCR (RSV, FLU A&B, COVID)  RVPGX2
Influenza A by PCR: NEGATIVE
Influenza B by PCR: NEGATIVE
Resp Syncytial Virus by PCR: NEGATIVE
SARS Coronavirus 2 by RT PCR: NEGATIVE

## 2022-12-03 LAB — BRAIN NATRIURETIC PEPTIDE: B Natriuretic Peptide: 281.1 pg/mL — ABNORMAL HIGH (ref 0.0–100.0)

## 2022-12-03 LAB — IRON AND TIBC
Iron: 26 ug/dL — ABNORMAL LOW (ref 45–182)
Saturation Ratios: 8 % — ABNORMAL LOW (ref 17.9–39.5)
TIBC: 318 ug/dL (ref 250–450)
UIBC: 292 ug/dL

## 2022-12-03 LAB — TROPONIN I (HIGH SENSITIVITY)
Troponin I (High Sensitivity): 1453 ng/L (ref <18)
Troponin I (High Sensitivity): 2250 ng/L (ref <18)

## 2022-12-03 LAB — VITAMIN B12: Vitamin B-12: 297 pg/mL (ref 180–914)

## 2022-12-03 LAB — FERRITIN: Ferritin: 56 ng/mL (ref 24–336)

## 2022-12-03 LAB — D-DIMER, QUANTITATIVE: D-Dimer, Quant: 0.78 ug{FEU}/mL — ABNORMAL HIGH (ref 0.00–0.50)

## 2022-12-03 MED ORDER — MAGNESIUM OXIDE -MG SUPPLEMENT 400 (240 MG) MG PO TABS
400.0000 mg | ORAL_TABLET | Freq: Every day | ORAL | Status: DC
  Administered 2022-12-04 – 2022-12-06 (×3): 400 mg via ORAL
  Filled 2022-12-03 (×3): qty 1

## 2022-12-03 MED ORDER — SODIUM CHLORIDE 0.9% FLUSH: 3.0000 mL | INTRAVENOUS | Status: DC | PRN

## 2022-12-03 MED ORDER — BENAZEPRIL HCL 20 MG PO TABS
20.0000 mg | ORAL_TABLET | Freq: Every day | ORAL | Status: DC
  Administered 2022-12-04 – 2022-12-05 (×2): 20 mg via ORAL
  Filled 2022-12-03 (×2): qty 1

## 2022-12-03 MED ORDER — FAMOTIDINE 20 MG PO TABS
20.0000 mg | ORAL_TABLET | Freq: Every day | ORAL | Status: DC
  Administered 2022-12-04 – 2022-12-05 (×3): 20 mg via ORAL
  Filled 2022-12-03 (×3): qty 1

## 2022-12-03 MED ORDER — HEPARIN (PORCINE) 25000 UT/250ML-% IV SOLN
1450.0000 [IU]/h | INTRAVENOUS | Status: DC
  Administered 2022-12-03 – 2022-12-04 (×2): 1200 [IU]/h via INTRAVENOUS
  Administered 2022-12-05 – 2022-12-06 (×2): 1450 [IU]/h via INTRAVENOUS
  Filled 2022-12-03 (×4): qty 250

## 2022-12-03 MED ORDER — FINASTERIDE 5 MG PO TABS
5.0000 mg | ORAL_TABLET | Freq: Every day | ORAL | Status: DC
  Administered 2022-12-04 – 2022-12-06 (×3): 5 mg via ORAL
  Filled 2022-12-03 (×3): qty 1

## 2022-12-03 MED ORDER — ACETAMINOPHEN 325 MG PO TABS: 650.0000 mg | ORAL_TABLET | Freq: Four times a day (QID) | ORAL | Status: DC | PRN

## 2022-12-03 MED ORDER — NITROGLYCERIN 2 % TD OINT
1.0000 [in_us] | TOPICAL_OINTMENT | Freq: Four times a day (QID) | TRANSDERMAL | Status: DC
  Administered 2022-12-03 – 2022-12-05 (×5): 1 [in_us] via TOPICAL
  Filled 2022-12-03 (×5): qty 1

## 2022-12-03 MED ORDER — ENOXAPARIN SODIUM 40 MG/0.4ML IJ SOSY: 40.0000 mg | PREFILLED_SYRINGE | INTRAMUSCULAR | Status: DC

## 2022-12-03 MED ORDER — POLYETHYLENE GLYCOL 3350 17 G PO PACK: 17.0000 g | PACK | Freq: Every day | ORAL | Status: DC | PRN

## 2022-12-03 MED ORDER — EZETIMIBE 10 MG PO TABS
10.0000 mg | ORAL_TABLET | Freq: Every day | ORAL | Status: DC
  Administered 2022-12-03 – 2022-12-06 (×4): 10 mg via ORAL
  Filled 2022-12-03 (×4): qty 1

## 2022-12-03 MED ORDER — ACETAMINOPHEN 650 MG RE SUPP: 650.0000 mg | Freq: Four times a day (QID) | RECTAL | Status: DC | PRN

## 2022-12-03 MED ORDER — HEPARIN BOLUS VIA INFUSION
4000.0000 [IU] | Freq: Once | INTRAVENOUS | Status: AC
  Administered 2022-12-03: 4000 [IU] via INTRAVENOUS
  Filled 2022-12-03: qty 4000

## 2022-12-03 MED ORDER — SODIUM CHLORIDE 0.9 % IV SOLN: 250.0000 mL | INTRAVENOUS | Status: DC | PRN

## 2022-12-03 MED ORDER — FUROSEMIDE 10 MG/ML IJ SOLN
40.0000 mg | Freq: Once | INTRAMUSCULAR | Status: AC
  Administered 2022-12-03: 40 mg via INTRAVENOUS
  Filled 2022-12-03: qty 4

## 2022-12-03 MED ORDER — POTASSIUM CHLORIDE CRYS ER 10 MEQ PO TBCR
10.0000 meq | EXTENDED_RELEASE_TABLET | Freq: Every day | ORAL | Status: DC
  Administered 2022-12-03 – 2022-12-06 (×4): 10 meq via ORAL
  Filled 2022-12-03 (×6): qty 1

## 2022-12-03 MED ORDER — FUROSEMIDE 10 MG/ML IJ SOLN
40.0000 mg | Freq: Every day | INTRAMUSCULAR | Status: DC
  Administered 2022-12-03: 40 mg via INTRAVENOUS
  Filled 2022-12-03: qty 4

## 2022-12-03 MED ORDER — GABAPENTIN 100 MG PO CAPS
200.0000 mg | ORAL_CAPSULE | Freq: Two times a day (BID) | ORAL | Status: DC
  Administered 2022-12-04 – 2022-12-06 (×6): 200 mg via ORAL
  Filled 2022-12-03 (×6): qty 2

## 2022-12-03 MED ORDER — FUROSEMIDE 10 MG/ML IJ SOLN
40.0000 mg | Freq: Every day | INTRAMUSCULAR | Status: DC
  Administered 2022-12-04 – 2022-12-06 (×3): 40 mg via INTRAVENOUS
  Filled 2022-12-03 (×3): qty 4

## 2022-12-03 MED ORDER — SODIUM CHLORIDE 0.9% FLUSH
3.0000 mL | Freq: Two times a day (BID) | INTRAVENOUS | Status: DC
  Administered 2022-12-04 – 2022-12-06 (×4): 3 mL via INTRAVENOUS

## 2022-12-03 MED ORDER — POTASSIUM CHLORIDE CRYS ER 20 MEQ PO TBCR
40.0000 meq | EXTENDED_RELEASE_TABLET | Freq: Once | ORAL | Status: AC
  Administered 2022-12-03: 40 meq via ORAL
  Filled 2022-12-03: qty 2

## 2022-12-03 NOTE — Assessment & Plan Note (Addendum)
89 year old presenting with acute onset of shortness of breath and dyspnea on exertion found to have oxygen saturation of 78% on RA requiring CPAP in setting of likely new CHF exacerbation  -admit to progressive -he has weaned from bipap to [place], but still has some mild dyspnea with talking  -CXR with pulmonary edema, elevated BNP and increased leg swelling with dyspnea on exertion favors new CHF exacerbation and will treat accordingly  -check troponin with changes on EKG -denies any chest pain  -lung exam with fibrous sounds, check CT chest HR for ILD  -VBG pending -d-dimer age adjusted with no elevation and DVT study negative  -no infectious signs/symptoms. No recent illness. Covid/flu/rsv all negative  -continue to wean oxygen as tolerated

## 2022-12-03 NOTE — ED Notes (Signed)
Dr Artis Flock made aware of critical troponin level of 2250. Pt denies any CP remains on cardiac monitor awaiting for cardiology to see pt.

## 2022-12-03 NOTE — Assessment & Plan Note (Addendum)
Initial troponin >1000, denies any chest pain Discussed with on call cardiology with changes on EKG Echo pending Cardiology consulted: Dr. Algie Coffer  Allergy to statin Started on heparin Declined LHC

## 2022-12-03 NOTE — Assessment & Plan Note (Signed)
Continue proscar and flomax

## 2022-12-03 NOTE — Progress Notes (Signed)
Left lower ext venous duplex  has been completed. Refer to [facility] under chart review to view preliminary results.   12/03/2022  10:41 AM Amyrah Pinkhasov, Gerarda Gunther

## 2022-12-03 NOTE — Assessment & Plan Note (Signed)
At baseline, continue to monitor  Would expect some improvement with diuresis

## 2022-12-03 NOTE — Assessment & Plan Note (Signed)
Check iron studies/B12 Has fluctuated over past year Trend

## 2022-12-03 NOTE — H&P (Signed)
History and Physical    Patient: Darin [name] ZOX:[id number] DOB: [birth year] DOA: 12/03/2022 DOS: the patient was seen and examined on 12/03/2022 PCP: [name], MD  Patient coming from: Home - lives alone. Uses walker and WC    Chief Complaint: shortness of breath   HPI: Darin [name] is a 89 y.o. male with medical history significant of GERD, HTN, HLD, neuropathy, PMR who presented to ED with complaints of shortness of breath that started last night. He states after he went to bed last night he started to notice shortness of breath. He states this became progressively worse and he felt like he was gasping for air and had a raspy voice. He also states he had an associated dry cough. He denies any orthopnea. He had increased dyspnea on exertion and would have to stop every few feet as he would run out of breath just going to the bathroom which is new for him. He is unsure about weight gain. He also feels like his his left leg was much more swollen than baseline.   EMS called and his sats were 78% on RA and started on cpap.   He has been feeling good. Denies any recent illness. Denies any fever/chills, vision changes/headaches, chest pain or palpitations, abdominal pain, N/V/D. He has intermittent dysuria, but states this is normal for him.   He does not smoke or drink alcohol.   ER Course:  vitals: afebrile, bp: 126/68, HR: 112, RR: 24, oxygen: 92%RA Pertinent labs: covid/flu/RSV negative, BNP: 281, hgb: 10.9, sodium: 130, Co2: 20, d-dimer: .78 CXR: new symmetric lung opacities favored to reflect edema with small pleural effusions.  In ED: given 40mg  of laix, potassium and NG. Placed on cpap.TRH asked to admit.    Review of Systems: As mentioned in the history of present illness. All other systems reviewed and are negative. Past Medical History:  Diagnosis Date   Arthritis    DUPUYTREN'S CONTRACTURE, RIGHT 07/04/2009   Annotation: 4th digit Qualifier: Diagnosis of  By: Lovell Sheehan  MD, Balinda Quails    GERD (gastroesophageal reflux disease)    H/O hiatal hernia    "FOR YEARS" " NO PROBLEM"   Hyperlipidemia    Hypertension    Knee pain    Neuropathy    PERIPHERAL    Osteoporosis    PMR (polymyalgia rheumatica) (HCC)    Polymyalgia (HCC) 5 YEARS   Past Surgical History:  Procedure Laterality Date   CARPAL TUNNEL RELEASE     CATARACT EXTRACTION     right   COLONOSCOPY  2007   INGUINAL HERNIA REPAIR  10/14/2011   Procedure: HERNIA REPAIR INGUINAL ADULT;  Surgeon: Shelly Rubenstein, MD;  Location: [facility] OR;  Service: General;  Laterality: Right;  Right Inguinal Hernia Repair with Mesh   TIBIA IM NAIL INSERTION Right 05/22/2021   Procedure: INTRAMEDULLARY (IM) NAIL TIBIAL;  Surgeon: Myrene Galas, MD;  Location: [facility] OR;  Service: Orthopedics;  Laterality: Right;   TONSILLECTOMY     VARICOSE VEIN SURGERY     Social History:  reports that he has never smoked. He has never used smokeless tobacco. He reports that he does not currently use alcohol after a past usage of about 3.0 standard drinks of alcohol per week. He reports that he does not use drugs.  Allergies  Allergen Reactions   Statins Other (See Comments)    Muscle pain and upset stomach  Other Reaction(s): leg pain, stoamch    Family History  Problem Relation Age of  Onset   Heart disease Father    Coronary artery disease Other     Prior to Admission medications   Medication Sig Start Date End Date Taking? Authorizing Provider  amLODipine (NORVASC) 5 MG tablet TAKE 1 TABLET DAILY 11/28/21   Burchette, Elberta Fortis, MD  benazepril (LOTENSIN) 20 MG tablet Take 1 tablet (20 mg total) by mouth daily. 10/23/22   Burchette, Elberta Fortis, MD  ciprofloxacin-dexamethasone (CIPRODEX) OTIC suspension Place 4 drops into the right ear 2 (two) times daily. 10/16/22   [place], Betty G, MD  famotidine (PEPCID) 20 MG tablet TAKE 1 TABLET AT BEDTIME 11/11/22   Burchette, Elberta Fortis, MD  finasteride (PROSCAR) 5 MG tablet TAKE ONE TABLET BY MOUTH  ONCE DAILY 10/15/22   Burchette, Elberta Fortis, MD  gabapentin (NEURONTIN) 100 MG capsule TAKE 1 CAPSULE 4 TIMES     DAILY 11/11/22   Burchette, Elberta Fortis, MD  magnesium oxide (MAG-OX) 400 MG tablet Take 400 mg by mouth daily.    [provider]  meloxicam (MOBIC) 15 MG tablet TAKE 1 TABLET DAILY 11/11/22   Burchette, Elberta Fortis, MD  polyethylene glycol (MIRALAX / GLYCOLAX) packet Take 17 g by mouth daily as needed for moderate constipation. Patient taking differently: Take 17 g by mouth 2 (two) times daily. 03/16/17   Glade Lloyd, MD  tamsulosin (FLOMAX) 0.4 MG CAPS capsule Take 1 capsule (0.4 mg total) by mouth daily. 11/21/21   Burchette, Elberta Fortis, MD  vitamin C (ASCORBIC ACID) 500 MG tablet Take 500 mg by mouth daily.    [provider]  VITAMIN D PO Take 2,500 Units by mouth at bedtime.    [provider]    Physical Exam: Vitals:   12/03/22 1253 12/03/22 1445 12/03/22 1721 12/03/22 1730  BP:  126/66  121/73  Pulse:  (!) 105  (!) 106  Resp:  (!) 23  20  Temp: 97.9 F (36.6 C)  98.4 F (36.9 C)   TempSrc: Oral  Oral   SpO2:  94%  96%  Weight:      Height:       General:  Appears calm and comfortable and is in NAD Eyes:  PERRL, EOMI, normal lids, iris ENT:  HOH, lips & tongue, mmm; appropriate dentition Neck:  no LAD, masses or thyromegaly; no carotid bruits, no JVD Cardiovascular:  tachycardic, normal rhythm, no m/r/g. BLE edema L>R  Respiratory:   RLL with fibrous sounds in bases. No wheezing. Moving air. Mild dyspnea with talking  Abdomen:  soft, NT, ND, NABS Back:   normal alignment, no CVAT Skin:  no rash or induration seen on limited exam Musculoskeletal:  grossly normal tone BUE/BLE, good ROM, no bony abnormality Lower extremity:    Limited foot exam with no ulcerations.  2+ distal pulses. Psychiatric:  grossly normal mood and affect, speech fluent and appropriate, AOx3 Neurologic:  CN 2-12 grossly intact, moves all extremities in coordinated fashion,  sensation intact   Radiological Exams on Admission: Independently reviewed - see discussion in A/P where applicable  VAS [place] LOWER EXTREMITY VENOUS (DVT) (7a-7p)  Result Date: 12/03/2022  Lower Venous DVT Study Patient Name:  LEONID BAISE  Date of Exam:   12/03/2022 Medical Rec #: [id number]       Accession #:    [id number] Date of Birth: October 28, 1931        Patient Gender: M Patient Age:   55 years Exam Location:  [facility] Procedure:      VAS [place]  LOWER EXTREMITY VENOUS (DVT) Referring Phys: JON KNAPP --------------------------------------------------------------------------------  Indications: Swelling, and SOB.  Comparison Study: No prior left sided studies Performing Technologist: Marilynne Halsted RDMS, RVT  Examination Guidelines: A complete evaluation includes B-mode imaging, spectral Doppler, color Doppler, and power Doppler as needed of all accessible portions of each vessel. Bilateral testing is considered an integral part of a complete examination. Limited examinations for reoccurring indications may be performed as noted. The reflux portion of the exam is performed with the patient in reverse Trendelenburg.  +-----+---------------+---------+-----------+----------+--------------+ RIGHTCompressibilityPhasicitySpontaneityPropertiesThrombus Aging +-----+---------------+---------+-----------+----------+--------------+ CFV  Full           Yes      Yes                                 +-----+---------------+---------+-----------+----------+--------------+ SFJ  Full                                                        +-----+---------------+---------+-----------+----------+--------------+   +---------+---------------+---------+-----------+----------+--------------+ LEFT     CompressibilityPhasicitySpontaneityPropertiesThrombus Aging +---------+---------------+---------+-----------+----------+--------------+ CFV      Full           Yes      Yes                                  +---------+---------------+---------+-----------+----------+--------------+ SFJ      Full                                                        +---------+---------------+---------+-----------+----------+--------------+ FV Prox  Full                                                        +---------+---------------+---------+-----------+----------+--------------+ FV Mid   Full           Yes      Yes                                 +---------+---------------+---------+-----------+----------+--------------+ FV DistalFull                                                        +---------+---------------+---------+-----------+----------+--------------+ PFV      Full                                                        +---------+---------------+---------+-----------+----------+--------------+ POP      Full           Yes      Yes                                 +---------+---------------+---------+-----------+----------+--------------+  PTV      Full                                                        +---------+---------------+---------+-----------+----------+--------------+ PERO     Full                                                        +---------+---------------+---------+-----------+----------+--------------+    Summary: RIGHT: - No evidence of common femoral vein obstruction.   LEFT: - There is no evidence of deep vein thrombosis in the lower extremity.  - No cystic structure found in the popliteal fossa.  *See table(s) above for measurements and observations. Electronically signed by Lemar Livings MD on 12/03/2022 at 5:39:46 PM.    Final    CT Chest High Resolution  Result Date: 12/03/2022 CLINICAL DATA:  Shortness of breath EXAM: CT CHEST WITHOUT CONTRAST TECHNIQUE: Multidetector CT imaging of the chest was performed following the standard protocol without intravenous contrast. High resolution imaging of the lungs, as well as inspiratory and  expiratory imaging, was performed. RADIATION DOSE REDUCTION: This exam was performed according to the departmental dose-optimization program which includes automated exposure control, adjustment of the mA and/or kV according to patient size and/or use of iterative reconstruction technique. COMPARISON:  None Available. FINDINGS: Cardiovascular: Normal heart size. No pericardial effusion. Normal caliber thoracic aorta with moderate calcified plaque. Severe left main and three-vessel coronary artery calcifications. Mediastinum/Nodes: Small hiatal hernia. Thyroid is unremarkable. No enlarged lymph nodes seen in the chest. Lungs/Pleura: Moderate bronchial wall thickening. Patchy central predominant ground-glass opacities and septal thickening. Moderate bilateral pleural effusions and atelectasis. Solid pulmonary nodule of the right upper lobe measuring 5 mm on series 6, image 47. Upper Abdomen: Partially visualized simple appearing cyst of the left kidney, no specific follow-up imaging is necessary. No acute abnormality. Musculoskeletal: No chest wall mass or suspicious bone lesions identified. IMPRESSION: 1. Patchy central predominant ground-glass opacities and septal thickening, findings are consistent with pulmonary edema. 2. Moderate bilateral pleural effusions and atelectasis. 3. Solid pulmonary nodule of the right upper lobe measuring 5 mm. No follow-up needed if patient is low-risk.This recommendation follows the consensus statement: Guidelines for Management of Incidental Pulmonary Nodules Detected on CT Images: From the Fleischner Society 2017; Radiology 2017; 284:228-243. 4. Coronary artery calcifications and aortic Atherosclerosis (ICD10-I70.0). Electronically Signed   By: Allegra Lai M.D.   On: 12/03/2022 16:17   DG Chest Port 1 View  Result Date: 12/03/2022 CLINICAL DATA:  Dyspnea. EXAM: PORTABLE CHEST 1 VIEW COMPARISON:  Chest radiograph 05/20/2021 FINDINGS: The cardiomediastinal silhouette is  unchanged with normal heart size. Aortic atherosclerosis is noted. New interstitial opacities are present throughout both lungs, and patchy and hazy airspace opacities are present in the perihilar regions and lung bases bilaterally. There are small bilateral pleural effusions. No pneumothorax is identified. No acute osseous abnormality is seen. IMPRESSION: New symmetric lung opacities favored to reflect edema with small pleural effusions. Electronically Signed   By: Sebastian Ache M.D.   On: 12/03/2022 11:36    EKG: Independently reviewed.  Sinus tachycardia with rate 115; new St changes in V1-V3.   Labs on Admission:  I have personally reviewed the available labs and imaging studies at the time of the admission.  Pertinent labs:   covid/flu/RSV negative,  BNP: 281,  hgb: 10.9,  sodium: 130,  Co2: 20,  d-dimer: .78  Assessment and Plan: Principal Problem:   Acute respiratory failure with hypoxia (HCC) Active Problems:   CHF exacerbation (HCC)   Elevated troponin/likely NSTEMI   Normocytic anemia   Hyponatremia   Essential hypertension   GERD (gastroesophageal reflux disease)   BPH (benign prostatic hyperplasia)    Assessment and Plan: * Acute respiratory failure with hypoxia (HCC) 89 year old presenting with acute onset of shortness of breath and dyspnea on exertion found to have oxygen saturation of 78% on RA requiring CPAP in setting of likely new CHF exacerbation  -admit to progressive -he has weaned from bipap to [place], but still has some mild dyspnea with talking  -CXR with pulmonary edema, elevated BNP and increased leg swelling with dyspnea on exertion favors new CHF exacerbation and will treat accordingly  -check troponin with changes on EKG -denies any chest pain  -lung exam with fibrous sounds, check CT chest HR for ILD  -VBG pending -d-dimer age adjusted with no elevation and DVT study negative  -no infectious signs/symptoms. No recent illness. Covid/flu/rsv all negative   -continue to wean oxygen as tolerated   CHF exacerbation (HCC) Appears to have new CHF exacerbation with pulmonary edema on CXR, increased LE edema, hypoxia and elevated BNP -no recent illness  -echo in 2023 wnl, repeat pending -continue tele -strict I/O and daily weights -check troponin, but denies any chest pain  -lasix naive given 40mg  IV x1 today will monitor I&O and adjust as needed for optimal output -medical management after echo results -check HR chest CT to r/o ILD as contributing factor   Elevated troponin/likely NSTEMI Initial troponin >1000, denies any chest pain Discussed with on call cardiology with changes on EKG Echo pending Cardiology consulted: Dr. Algie Coffer  Allergy to statin Started on heparin Declined LHC   Normocytic anemia Check iron studies/B12 Has fluctuated over past year Trend   Hyponatremia At baseline, continue to monitor  Would expect some improvement with diuresis   Essential hypertension Continue lotensin 20mg  Hold norvasc until echo results    GERD (gastroesophageal reflux disease) Continue pepcid at bedtime   BPH (benign prostatic hyperplasia) Continue proscar and flomax      Advance Care Planning:   Code Status: Limited: Do not attempt resuscitation (DNR) -DNR-LIMITED -Do Not Intubate/DNI    Consults: PT/cardiology: Dr. Algie Coffer   DVT Prophylaxis: heparin gtt   Family Communication: none   Severity of Illness: The appropriate patient status for this patient is INPATIENT. Inpatient status is judged to be reasonable and necessary in order to provide the required intensity of service to ensure the patient's safety. The patient's presenting symptoms, physical exam findings, and initial radiographic and laboratory data in the context of their chronic comorbidities is felt to place them at high risk for further clinical deterioration. Furthermore, it is not anticipated that the patient will be medically stable for discharge from the  hospital within 2 midnights of admission.   * I certify that at the point of admission it is my clinical judgment that the patient will require inpatient hospital care spanning beyond 2 midnights from the point of admission due to high intensity of service, high risk for further deterioration and high frequency of surveillance required.*  Author: Orland Mustard, MD 12/03/2022 6:36 PM  For on call  review [email or web address].

## 2022-12-03 NOTE — Consult Note (Signed)
Referring Physician: Orland Mustard, MD  Darin [name] is an 89 y.o. male.                       Chief Complaint: Shortness of breath and elevated Troponin I  HPI: 89 years old white male with PMH of HTN, HLD, PMR, neuropathy has shortness of breath since last night. Patient denies chest pain but has noticed leg edema. Troponin I is elevated at 1453 ng. and BNP is elevated at 281.1 pg. Chest x-ray shows pulmonary edema and EKG shows new anteroseptal wall MI. Patient prefers medical treatment and refuses cardiac catheterization or stenting of coronary artery.  Past Medical History:  Diagnosis Date   Arthritis    DUPUYTREN'S CONTRACTURE, RIGHT 07/04/2009   Annotation: 4th digit Qualifier: Diagnosis of  By: Lovell Sheehan MD, Balinda Quails    GERD (gastroesophageal reflux disease)    H/O hiatal hernia    "FOR YEARS" " NO PROBLEM"   Hyperlipidemia    Hypertension    Knee pain    Neuropathy    PERIPHERAL    Osteoporosis    PMR (polymyalgia rheumatica) (HCC)    Polymyalgia (HCC) 5 YEARS      Past Surgical History:  Procedure Laterality Date   CARPAL TUNNEL RELEASE     CATARACT EXTRACTION     right   COLONOSCOPY  2007   INGUINAL HERNIA REPAIR  10/14/2011   Procedure: HERNIA REPAIR INGUINAL ADULT;  Surgeon: Shelly Rubenstein, MD;  Location: [facility] OR;  Service: General;  Laterality: Right;  Right Inguinal Hernia Repair with Mesh   TIBIA IM NAIL INSERTION Right 05/22/2021   Procedure: INTRAMEDULLARY (IM) NAIL TIBIAL;  Surgeon: Myrene Galas, MD;  Location: [facility] OR;  Service: Orthopedics;  Laterality: Right;   TONSILLECTOMY     VARICOSE VEIN SURGERY      Family History  Problem Relation Age of Onset   Heart disease Father    Coronary artery disease Other    Social History:  reports that he has never smoked. He has never used smokeless tobacco. He reports that he does not currently use alcohol after a past usage of about 3.0 standard drinks of alcohol per week. He reports that he does not use  drugs.  Allergies:  Allergies  Allergen Reactions   Statins Other (See Comments)    Muscle pain and upset stomach  Other Reaction(s): leg pain, stoamch    (Not in a hospital admission)   Results for orders placed or performed during the hospital encounter of 12/03/22 (from the past 48 hour(s))  Resp panel by RT-PCR (RSV, Flu A&B, Covid) Anterior Nasal Swab     Status: None   Collection Time: 12/03/22  9:30 AM   Specimen: Anterior Nasal Swab  Result Value Ref Range   SARS Coronavirus 2 by RT PCR NEGATIVE NEGATIVE   Influenza A by PCR NEGATIVE NEGATIVE   Influenza B by PCR NEGATIVE NEGATIVE    Comment: (NOTE) The Xpert Xpress SARS-CoV-2/FLU/RSV plus assay is intended as an aid in the diagnosis of influenza from Nasopharyngeal swab specimens and should not be used as a sole basis for treatment. Nasal washings and aspirates are unacceptable for Xpert Xpress SARS-CoV-2/FLU/RSV testing.  Fact Sheet for Patients: BloggerCourse.com  Fact Sheet for Healthcare Providers: SeriousBroker.it  This test is not yet approved or cleared by the [place] FDA and has been authorized for detection and/or diagnosis of SARS-CoV-2 by FDA under an Emergency Use Authorization (EUA). This EUA will remain  in effect (meaning this test can be used) for the duration of the COVID-19 declaration under Section 564(b)(1) of the Act, 21 U.S.C. section 360bbb-3(b)(1), unless the authorization is terminated or revoked.     Resp Syncytial Virus by PCR NEGATIVE NEGATIVE    Comment: (NOTE) Fact Sheet for Patients: BloggerCourse.com  Fact Sheet for Healthcare Providers: SeriousBroker.it  This test is not yet approved or cleared by the [place] FDA and has been authorized for detection and/or diagnosis of SARS-CoV-2 by FDA under an Emergency Use Authorization (EUA). This EUA will remain in effect  (meaning this test can be used) for the duration of the COVID-19 declaration under Section 564(b)(1) of the Act, 21 U.S.C. section 360bbb-3(b)(1), unless the authorization is terminated or revoked.  Performed at [facility], [address], [place], [place] [address]   Brain natriuretic peptide     Status: Abnormal   Collection Time: 12/03/22  9:31 AM  Result Value Ref Range   B Natriuretic Peptide 281.1 (H) 0.0 - 100.0 pg/mL    Comment: Performed at [facility], [address], [place], [place] [address]  Comprehensive metabolic panel     Status: Abnormal   Collection Time: 12/03/22  9:56 AM  Result Value Ref Range   Sodium 130 (L) 135 - 145 mmol/L   Potassium 4.6 3.5 - 5.1 mmol/L   Chloride 96 (L) 98 - 111 mmol/L   CO2 20 (L) 22 - 32 mmol/L   Glucose, Bld 175 (H) 70 - 99 mg/dL    Comment: Glucose reference range applies only to samples taken after fasting for at least 8 hours.   BUN 20 8 - 23 mg/dL   Creatinine, Ser 6.96 0.61 - 1.24 mg/dL   Calcium 9.2 8.9 - 29.5 mg/dL   Total Protein 5.9 (L) 6.5 - 8.1 g/dL   Albumin 2.8 (L) 3.5 - 5.0 g/dL   AST 21 15 - 41 U/L   ALT 18 0 - 44 U/L   Alkaline Phosphatase 74 38 - 126 U/L   Total Bilirubin 0.9 0.3 - 1.2 mg/dL   GFR, Estimated >28 >41 mL/min    Comment: (NOTE) Calculated using the CKD-EPI Creatinine Equation (2021)    Anion gap 14 5 - 15    Comment: Performed at [facility], [address], [place], [place] [address]  CBC     Status: Abnormal   Collection Time: 12/03/22  9:56 AM  Result Value Ref Range   WBC 10.4 4.0 - 10.5 K/uL   RBC 3.54 (L) 4.22 - 5.81 MIL/uL   Hemoglobin 10.9 (L) 13.0 - 17.0 g/dL   HCT 10.2 (L) 72.5 - 36.6 %   MCV 96.3 80.0 - 100.0 fL   MCH 30.8 26.0 - 34.0 pg   MCHC 32.0 30.0 - 36.0 g/dL   RDW 44.0 34.7 - 42.5 %   Platelets 297 150 - 400 K/uL   nRBC 0.0 0.0 - 0.2 %    Comment: Performed at [facility], [address] [place], [place] [address]  D-dimer, quantitative      Status: Abnormal   Collection Time: 12/03/22  9:56 AM  Result Value Ref Range   D-Dimer, Quant 0.78 (H) 0.00 - 0.50 ug/mL-FEU    Comment: (NOTE) At the manufacturer cut-off value of 0.5 g/mL FEU, this assay has a negative predictive value of 95-100%.This assay is intended for use in conjunction with a clinical pretest probability (PTP) assessment model to exclude pulmonary  embolism (PE) and deep venous thrombosis (DVT) in outpatients suspected of PE or DVT. Results should be correlated with clinical presentation. Performed at [facility], [address], [place], [place] [address]   Troponin I (High Sensitivity)     Status: Abnormal   Collection Time: 12/03/22 12:41 PM  Result Value Ref Range   Troponin I (High Sensitivity) 1,453 (HH) <18 ng/L    Comment: CRITICAL RESULT CALLED TO, READ BACK BY AND VERIFIED WITH BROOKE B, RN AT 1433 09.10.24 D. BLU (NOTE) Elevated high sensitivity troponin I (hsTnI) values and significant  changes across serial measurements may suggest ACS but many other  chronic and acute conditions are known to elevate hsTnI results.  Refer to the "Links" section for chest pain algorithms and additional  guidance. Performed at [facility], [address], [place], [place] [address]   I-Stat venous blood gas, [facility], [facility], [facility])     Status: Abnormal   Collection Time: 12/03/22  1:58 PM  Result Value Ref Range   pH, Ven 7.397 7.25 - 7.43   pCO2, Ven 37.7 (L) 44 - 60 mmHg   pO2, Ven 44 32 - 45 mmHg   Bicarbonate 23.2 20.0 - 28.0 mmol/L   TCO2 24 22 - 32 mmol/L   O2 Saturation 79 %   Acid-base deficit 1.0 0.0 - 2.0 mmol/L   Sodium 130 (L) 135 - 145 mmol/L   Potassium 4.7 3.5 - 5.1 mmol/L   Calcium, Ion 1.17 1.15 - 1.40 mmol/L   HCT 33.0 (L) 39.0 - 52.0 %   Hemoglobin 11.2 (L) 13.0 - 17.0 g/dL   Sample type VENOUS    CT Chest High Resolution  Result Date: 12/03/2022 CLINICAL DATA:  Shortness of breath EXAM: CT CHEST WITHOUT CONTRAST  TECHNIQUE: Multidetector CT imaging of the chest was performed following the standard protocol without intravenous contrast. High resolution imaging of the lungs, as well as inspiratory and expiratory imaging, was performed. RADIATION DOSE REDUCTION: This exam was performed according to the departmental dose-optimization program which includes automated exposure control, adjustment of the mA and/or kV according to patient size and/or use of iterative reconstruction technique. COMPARISON:  None Available. FINDINGS: Cardiovascular: Normal heart size. No pericardial effusion. Normal caliber thoracic aorta with moderate calcified plaque. Severe left main and three-vessel coronary artery calcifications. Mediastinum/Nodes: Small hiatal hernia. Thyroid is unremarkable. No enlarged lymph nodes seen in the chest. Lungs/Pleura: Moderate bronchial wall thickening. Patchy central predominant ground-glass opacities and septal thickening. Moderate bilateral pleural effusions and atelectasis. Solid pulmonary nodule of the right upper lobe measuring 5 mm on series 6, image 47. Upper Abdomen: Partially visualized simple appearing cyst of the left kidney, no specific follow-up imaging is necessary. No acute abnormality. Musculoskeletal: No chest wall mass or suspicious bone lesions identified. IMPRESSION: 1. Patchy central predominant ground-glass opacities and septal thickening, findings are consistent with pulmonary edema. 2. Moderate bilateral pleural effusions and atelectasis. 3. Solid pulmonary nodule of the right upper lobe measuring 5 mm. No follow-up needed if patient is low-risk.This recommendation follows the consensus statement: Guidelines for Management of Incidental Pulmonary Nodules Detected on CT Images: From the Fleischner Society 2017; Radiology 2017; 284:228-243. 4. Coronary artery calcifications and aortic Atherosclerosis (ICD10-I70.0). Electronically Signed   By: Allegra Lai M.D.   On: 12/03/2022 16:17   DG  Chest Port 1 View  Result Date: 12/03/2022 CLINICAL DATA:  Dyspnea. EXAM: PORTABLE CHEST 1 VIEW COMPARISON:  Chest radiograph 05/20/2021 FINDINGS: The cardiomediastinal silhouette is  unchanged with normal heart size. Aortic atherosclerosis is noted. New interstitial opacities are present throughout both lungs, and patchy and hazy airspace opacities are present in the perihilar regions and lung bases bilaterally. There are small bilateral pleural effusions. No pneumothorax is identified. No acute osseous abnormality is seen. IMPRESSION: New symmetric lung opacities favored to reflect edema with small pleural effusions. Electronically Signed   By: Sebastian Ache M.D.   On: 12/03/2022 11:36   VAS [place] LOWER EXTREMITY VENOUS (DVT) (7a-7p)  Result Date: 12/03/2022  Lower Venous DVT Study Patient Name:  Darin [name]  Date of Exam:   12/03/2022 Medical Rec #: [id number]       Accession #:    [id number] Date of Birth: 19-Nov-1931        Patient Gender: M Patient Age:   51 years Exam Location:  [facility] Procedure:      VAS [place] LOWER EXTREMITY VENOUS (DVT) Referring Phys: JON KNAPP --------------------------------------------------------------------------------  Indications: Swelling, and SOB.  Comparison Study: No prior left sided studies Performing Technologist: Marilynne Halsted RDMS, RVT  Examination Guidelines: A complete evaluation includes B-mode imaging, spectral Doppler, color Doppler, and power Doppler as needed of all accessible portions of each vessel. Bilateral testing is considered an integral part of a complete examination. Limited examinations for reoccurring indications may be performed as noted. The reflux portion of the exam is performed with the patient in reverse Trendelenburg.  +-----+---------------+---------+-----------+----------+--------------+ RIGHTCompressibilityPhasicitySpontaneityPropertiesThrombus Aging +-----+---------------+---------+-----------+----------+--------------+ CFV   Full           Yes      Yes                                 +-----+---------------+---------+-----------+----------+--------------+ SFJ  Full                                                        +-----+---------------+---------+-----------+----------+--------------+   +---------+---------------+---------+-----------+----------+--------------+ LEFT     CompressibilityPhasicitySpontaneityPropertiesThrombus Aging +---------+---------------+---------+-----------+----------+--------------+ CFV      Full           Yes      Yes                                 +---------+---------------+---------+-----------+----------+--------------+ SFJ      Full                                                        +---------+---------------+---------+-----------+----------+--------------+ FV Prox  Full                                                        +---------+---------------+---------+-----------+----------+--------------+ FV Mid   Full           Yes      Yes                                 +---------+---------------+---------+-----------+----------+--------------+  FV DistalFull                                                        +---------+---------------+---------+-----------+----------+--------------+ PFV      Full                                                        +---------+---------------+---------+-----------+----------+--------------+ POP      Full           Yes      Yes                                 +---------+---------------+---------+-----------+----------+--------------+ PTV      Full                                                        +---------+---------------+---------+-----------+----------+--------------+ PERO     Full                                                        +---------+---------------+---------+-----------+----------+--------------+    Summary: RIGHT: - No evidence of common femoral vein obstruction.    LEFT: - There is no evidence of deep vein thrombosis in the lower extremity.  - No cystic structure found in the popliteal fossa.  *See table(s) above for measurements and observations.    Preliminary     Review Of Systems Constitutional: No fever, chills, weight loss or gain. Eyes: No vision change, wears glasses. No discharge or pain. Ears: No hearing loss, No tinnitus. Respiratory: No asthma, COPD, pneumonias. Positive shortness of breath. No hemoptysis. Cardiovascular: Positive chest pain, palpitation, leg edema. Gastrointestinal: No nausea, vomiting, diarrhea, constipation. No GI bleed. No hepatitis. Genitourinary: No dysuria, hematuria, kidney stone. No incontinance. Neurological: No headache, stroke, seizures.  Psychiatry: No psych facility admission for anxiety, depression, suicide. No detox. Skin: No rash. Musculoskeletal: Positive joint pain, no fibromyalgia. No neck pain, back pain. Lymphadenopathy: No lymphadenopathy. Hematology: No anemia or easy bruising.   Blood pressure 126/66, pulse (!) 105, temperature 97.9 F (36.6 C), temperature source Oral, resp. rate (!) 23, height 6\' 1"  (1.854 m), weight 90.7 kg, SpO2 94%. Body mass index is 26.39 kg/m. General appearance: alert, cooperative, appears stated age and no distress Head: Normocephalic, atraumatic. Eyes: Blue eyes, pink conjunctiva, corneas clear.  Neck: No adenopathy, no carotid bruit, positive JVD, supple, symmetrical, trachea midline and thyroid not enlarged. Resp: Clearing to auscultation bilaterally. Cardio: Irregular rate and rhythm, S1, S2 normal, II/VI systolic murmur, no click, rub or gallop GI: Soft, non-tender; bowel sounds normal; no organomegaly. Extremities: 2 + lower leg edema, no cyanosis or clubbing. Skin: Warm and dry.  Neurologic: Alert and oriented X 3, normal strength.   Assessment/Plan Acute ASWMI Acute systolic left heart failure HTN HLD  Hyperglycemia Hyponatremia  Plan: IV  heparin. Offered cardiac catheterization, patient refuses. Add atorvastatin. Continue IV lasix. Agree with echocardiogram.  Time spent: Review of old records, Lab, x-rays, EKG, other cardiac tests, examination, discussion with patient/Nurse/Doctor over 70 minutes.  Ricki Rodriguez, MD  12/03/2022, 5:11 PM

## 2022-12-03 NOTE — ED Notes (Signed)
ED TO INPATIENT HANDOFF REPORT  ED Nurse Name and Phone #: Nehemiah Settle [phone]  S Name/Age/Gender Darin [name] 89 y.o. male Room/Bed: 028C/028C  Code Status   Code Status: Prior  Home/SNF/Other Home Patient oriented to: self, place, time, and situation Is this baseline? Yes   Triage Complete: Triage complete  Chief Complaint [facility]  Triage Note Pt BIBEMS from home w cc of SOB starting last night. Pt denies any other symptoms and states he called 911 due to the lingering SOB throughout the night. Per EMS pt was 78% RA upon their arrival. Pt received 1 duoneb and nitroglycerin x3 and pt had slight relief but was still feeling SOB. Upon arrival to ED pt was noted to be 90% RA pt was placed on bipap by RT and remains at 100%. Pt has never required oxygen before.    Allergies Allergies  Allergen Reactions   Statins Other (See Comments)    Muscle pain and upset stomach  Other Reaction(s): leg pain, stoamch    Level of Care/Admitting Diagnosis ED Disposition     None       B Medical/Surgery History Past Medical History:  Diagnosis Date   Arthritis    DUPUYTREN'S CONTRACTURE, RIGHT 07/04/2009   Annotation: 4th digit Qualifier: Diagnosis of  By: Lovell Sheehan MD, John E    GERD (gastroesophageal reflux disease)    H/O hiatal hernia    "FOR YEARS" " NO PROBLEM"   Hyperlipidemia    Hypertension    Knee pain    Neuropathy    PERIPHERAL    Osteoporosis    PMR (polymyalgia rheumatica) (HCC)    Polymyalgia (HCC) 5 YEARS   Past Surgical History:  Procedure Laterality Date   CARPAL TUNNEL RELEASE     CATARACT EXTRACTION     right   COLONOSCOPY  2007   INGUINAL HERNIA REPAIR  10/14/2011   Procedure: HERNIA REPAIR INGUINAL ADULT;  Surgeon: Shelly Rubenstein, MD;  Location: [facility] OR;  Service: General;  Laterality: Right;  Right Inguinal Hernia Repair with Mesh   TIBIA IM NAIL INSERTION Right 05/22/2021   Procedure: INTRAMEDULLARY (IM) NAIL TIBIAL;  Surgeon: Myrene Galas, MD;   Location: [facility] OR;  Service: Orthopedics;  Laterality: Right;   TONSILLECTOMY     VARICOSE VEIN SURGERY       A IV Location/Drains/Wounds Patient Lines/Drains/Airways Status     Active Line/Drains/Airways     Name Placement date Placement time Site Days   Peripheral IV 12/03/22 18 G Left Antecubital 12/03/22  0914  Antecubital  less than 1   Incision 10/14/11 Groin Right 10/14/11  1241  -- 4068   Incision (Closed) 05/22/21 Leg Right 05/22/21  1406  -- 560            Intake/Output Last 24 hours No intake or output data in the 24 hours ending 12/03/22 1216  Labs/Imaging Results for orders placed or performed during the hospital encounter of 12/03/22 (from the past 48 hour(s))  Resp panel by RT-PCR (RSV, Flu A&B, Covid) Anterior Nasal Swab     Status: None   Collection Time: 12/03/22  9:30 AM   Specimen: Anterior Nasal Swab  Result Value Ref Range   SARS Coronavirus 2 by RT PCR NEGATIVE NEGATIVE   Influenza A by PCR NEGATIVE NEGATIVE   Influenza B by PCR NEGATIVE NEGATIVE    Comment: (NOTE) The Xpert Xpress SARS-CoV-2/FLU/RSV plus assay is intended as an aid in the diagnosis of influenza from Nasopharyngeal swab specimens and should not  be used as a sole basis for treatment. Nasal washings and aspirates are unacceptable for Xpert Xpress SARS-CoV-2/FLU/RSV testing.  Fact Sheet for Patients: BloggerCourse.com  Fact Sheet for Healthcare Providers: SeriousBroker.it  This test is not yet approved or cleared by the [place] FDA and has been authorized for detection and/or diagnosis of SARS-CoV-2 by FDA under an Emergency Use Authorization (EUA). This EUA will remain in effect (meaning this test can be used) for the duration of the COVID-19 declaration under Section 564(b)(1) of the Act, 21 U.S.C. section 360bbb-3(b)(1), unless the authorization is terminated or revoked.     Resp Syncytial Virus by PCR NEGATIVE NEGATIVE     Comment: (NOTE) Fact Sheet for Patients: BloggerCourse.com  Fact Sheet for Healthcare Providers: SeriousBroker.it  This test is not yet approved or cleared by the [place] FDA and has been authorized for detection and/or diagnosis of SARS-CoV-2 by FDA under an Emergency Use Authorization (EUA). This EUA will remain in effect (meaning this test can be used) for the duration of the COVID-19 declaration under Section 564(b)(1) of the Act, 21 U.S.C. section 360bbb-3(b)(1), unless the authorization is terminated or revoked.  Performed at [facility], [address], [place], [place] [address]   Brain natriuretic peptide     Status: Abnormal   Collection Time: 12/03/22  9:31 AM  Result Value Ref Range   B Natriuretic Peptide 281.1 (H) 0.0 - 100.0 pg/mL    Comment: Performed at [facility], [address], [place], [place] [address]  Comprehensive metabolic panel     Status: Abnormal   Collection Time: 12/03/22  9:56 AM  Result Value Ref Range   Sodium 130 (L) 135 - 145 mmol/L   Potassium 4.6 3.5 - 5.1 mmol/L   Chloride 96 (L) 98 - 111 mmol/L   CO2 20 (L) 22 - 32 mmol/L   Glucose, Bld 175 (H) 70 - 99 mg/dL    Comment: Glucose reference range applies only to samples taken after fasting for at least 8 hours.   BUN 20 8 - 23 mg/dL   Creatinine, Ser 0.86 0.61 - 1.24 mg/dL   Calcium 9.2 8.9 - 57.8 mg/dL   Total Protein 5.9 (L) 6.5 - 8.1 g/dL   Albumin 2.8 (L) 3.5 - 5.0 g/dL   AST 21 15 - 41 U/L   ALT 18 0 - 44 U/L   Alkaline Phosphatase 74 38 - 126 U/L   Total Bilirubin 0.9 0.3 - 1.2 mg/dL   GFR, Estimated >46 >96 mL/min    Comment: (NOTE) Calculated using the CKD-EPI Creatinine Equation (2021)    Anion gap 14 5 - 15    Comment: Performed at [facility], [address], [place], [place] [address]  CBC     Status: Abnormal   Collection Time: 12/03/22  9:56 AM  Result Value Ref Range   WBC 10.4 4.0 -  10.5 K/uL   RBC 3.54 (L) 4.22 - 5.81 MIL/uL   Hemoglobin 10.9 (L) 13.0 - 17.0 g/dL   HCT 41.3 (L) 24.4 - 01.0 %   MCV 96.3 80.0 - 100.0 fL   MCH 30.8 26.0 - 34.0 pg   MCHC 32.0 30.0 - 36.0 g/dL   RDW 27.2 53.6 - 64.4 %   Platelets 297 150 - 400 K/uL   nRBC 0.0 0.0 - 0.2 %    Comment: Performed at [facility], [address] [place], [place] [address]  D-dimer, quantitative  Status: Abnormal   Collection Time: 12/03/22  9:56 AM  Result Value Ref Range   D-Dimer, Quant 0.78 (H) 0.00 - 0.50 ug/mL-FEU    Comment: (NOTE) At the manufacturer cut-off value of 0.5 g/mL FEU, this assay has a negative predictive value of 95-100%.This assay is intended for use in conjunction with a clinical pretest probability (PTP) assessment model to exclude pulmonary embolism (PE) and deep venous thrombosis (DVT) in outpatients suspected of PE or DVT. Results should be correlated with clinical presentation. Performed at [facility], [address] [place], [place] [address]    DG Chest Port 1 View  Result Date: 12/03/2022 CLINICAL DATA:  Dyspnea. EXAM: PORTABLE CHEST 1 VIEW COMPARISON:  Chest radiograph 05/20/2021 FINDINGS: The cardiomediastinal silhouette is unchanged with normal heart size. Aortic atherosclerosis is noted. New interstitial opacities are present throughout both lungs, and patchy and hazy airspace opacities are present in the perihilar regions and lung bases bilaterally. There are small bilateral pleural effusions. No pneumothorax is identified. No acute osseous abnormality is seen. IMPRESSION: New symmetric lung opacities favored to reflect edema with small pleural effusions. Electronically Signed   By: Sebastian Ache M.D.   On: 12/03/2022 11:36   VAS [place] LOWER EXTREMITY VENOUS (DVT) (7a-7p)  Result Date: 12/03/2022  Lower Venous DVT Study Patient Name:  Darin [name]  Date of Exam:   12/03/2022 Medical Rec #: [id number]       Accession #:    [id number] Date of Birth:  1931/09/14        Patient Gender: M Patient Age:   72 years Exam [facility]:  [facility] Procedure:      VAS [place] LOWER EXTREMITY VENOUS (DVT) Referring Phys: JON KNAPP --------------------------------------------------------------------------------  Indications: Swelling, and SOB.  Comparison Study: No prior left sided studies Performing Technologist: Marilynne Halsted RDMS, RVT  Examination Guidelines: A complete evaluation includes B-mode imaging, spectral Doppler, color Doppler, and power Doppler as needed of all accessible portions of each vessel. Bilateral testing is considered an integral part of a complete examination. Limited examinations for reoccurring indications may be performed as noted. The reflux portion of the exam is performed with the patient in reverse Trendelenburg.  +-----+---------------+---------+-----------+----------+--------------+ RIGHTCompressibilityPhasicitySpontaneityPropertiesThrombus Aging +-----+---------------+---------+-----------+----------+--------------+ CFV  Full           Yes      Yes                                 +-----+---------------+---------+-----------+----------+--------------+ SFJ  Full                                                        +-----+---------------+---------+-----------+----------+--------------+   +---------+---------------+---------+-----------+----------+--------------+ LEFT     CompressibilityPhasicitySpontaneityPropertiesThrombus Aging +---------+---------------+---------+-----------+----------+--------------+ CFV      Full           Yes      Yes                                 +---------+---------------+---------+-----------+----------+--------------+ SFJ      Full                                                        +---------+---------------+---------+-----------+----------+--------------+  FV Prox  Full                                                         +---------+---------------+---------+-----------+----------+--------------+ FV Mid   Full           Yes      Yes                                 +---------+---------------+---------+-----------+----------+--------------+ FV DistalFull                                                        +---------+---------------+---------+-----------+----------+--------------+ PFV      Full                                                        +---------+---------------+---------+-----------+----------+--------------+ POP      Full           Yes      Yes                                 +---------+---------------+---------+-----------+----------+--------------+ PTV      Full                                                        +---------+---------------+---------+-----------+----------+--------------+ PERO     Full                                                        +---------+---------------+---------+-----------+----------+--------------+    Summary: RIGHT: - No evidence of common femoral vein obstruction.   LEFT: - There is no evidence of deep vein thrombosis in the lower extremity.  - No cystic structure found in the popliteal fossa.  *See table(s) above for measurements and observations.    Preliminary     Pending Labs Unresulted Labs (From admission, onward)    None       Vitals/Pain Today's Vitals   12/03/22 0911 12/03/22 0915 12/03/22 0923 12/03/22 1030  BP: 126/68   137/72  Pulse: (!) 112   (!) 109  Resp: (!) 24   (!) 23  Temp: 97.6 F (36.4 C)     TempSrc: Oral     SpO2: 92% 92% 99% 98%  Weight:      Height:      PainSc:        Isolation Precautions No active isolations  Medications Medications  furosemide (LASIX) injection 40 mg (has no administration in time range)  potassium chloride SA (KLOR-CON M) CR tablet 40 mEq (has no  administration in time range)  nitroGLYCERIN (NITROGLYN) 2 % ointment 1 inch (has no administration in time range)     Mobility walks with device     Focused Assessments    R Recommendations: See Admitting Provider Note  Report given to:   Additional Notes:

## 2022-12-03 NOTE — Progress Notes (Signed)
ANTICOAGULATION CONSULT NOTE - Initial Consult  Pharmacy Consult for heparin Indication: chest pain/ACS  Allergies  Allergen Reactions   Statins Other (See Comments)    Muscle pain and upset stomach  Other Reaction(s): leg pain, stoamch    Patient Measurements: Height: 6\' 1"  (185.4 cm) Weight: 90.7 kg (200 lb) IBW/kg (Calculated) : 79.9 Heparin Dosing Weight: 90kg  Vital Signs: Temp: 98.4 F (36.9 C) (09/10 1721) Temp Source: Oral (09/10 1721) BP: 121/73 (09/10 1730) Pulse Rate: 106 (09/10 1730)  Labs: Recent Labs    12/03/22 0956 12/03/22 1241 12/03/22 1358 12/03/22 1441  HGB 10.9*  --  11.2*  --   HCT 34.1*  --  33.0*  --   PLT 297  --   --   --   CREATININE 0.93  --   --   --   TROPONINIHS  --  1,453*  --  2,250*    Estimated Creatinine Clearance: 60.9 mL/min (by C-G formula based on SCr of 0.93 mg/dL).   Medical History: Past Medical History:  Diagnosis Date   Arthritis    DUPUYTREN'S CONTRACTURE, RIGHT 07/04/2009   Annotation: 4th digit Qualifier: Diagnosis of  By: Lovell Sheehan MD, Balinda Quails    GERD (gastroesophageal reflux disease)    H/O hiatal hernia    "FOR YEARS" " NO PROBLEM"   Hyperlipidemia    Hypertension    Knee pain    Neuropathy    PERIPHERAL    Osteoporosis    PMR (polymyalgia rheumatica) (HCC)    Polymyalgia (HCC) 5 YEARS   Assessment: 66 YOM presenting with SOB, elevated troponin, he is not on anticoagulation PTA, chronic anemia stable, plts wnl  Goal of Therapy:  Heparin level 0.3-0.7 units/ml Monitor platelets by anticoagulation protocol: Yes   Plan:  Heparin 4000 units IV x 1, and gtt at 1200 units/hr F/u 8 hour heparin level  Daylene Posey, PharmD, [facility] Clinical Pharmacist ED Pharmacist Phone # [phone] 12/03/2022 5:37 PM

## 2022-12-03 NOTE — Assessment & Plan Note (Signed)
Continue pepcid at bedtime

## 2022-12-03 NOTE — Assessment & Plan Note (Addendum)
Appears to have new CHF exacerbation with pulmonary edema on CXR, increased LE edema, hypoxia and elevated BNP -no recent illness  -echo in 2023 wnl, repeat pending -continue tele -strict I/O and daily weights -check troponin, but denies any chest pain  -lasix naive given 40mg  IV x1 today will monitor I&O and adjust as needed for optimal output -medical management after echo results -check HR chest CT to r/o ILD as contributing factor

## 2022-12-03 NOTE — ED Notes (Signed)
US at bedside

## 2022-12-03 NOTE — ED Triage Notes (Signed)
Pt BIBEMS from home w cc of SOB starting last night. Pt denies any other symptoms and states he called 911 due to the lingering SOB throughout the night. Per EMS pt was 78% RA upon their arrival. Pt received 1 duoneb and nitroglycerin x3 and pt had slight relief but was still feeling SOB. Upon arrival to ED pt was noted to be 90% RA pt was placed on bipap by RT and remains at 100%. Pt has never required oxygen before.

## 2022-12-03 NOTE — ED Provider Notes (Signed)
Darin [name] EMERGENCY DEPARTMENT AT [facility] Provider Note   CSN: [id number] Arrival date & time: 12/03/22  9562     History  Chief Complaint  Patient presents with   Shortness of Breath    Darin [name] is a 89 y.o. male.   Shortness of Breath    Patient has a history of reflux hyperlipidemia hypertension polymyalgia rheumatica hiatal hernia neuropathy who presents to the ED with complaints of shortness of breath.  Patient states he started noticing difficulty breathing last night.  He had noticed some swelling in his left leg that was new.  He has not been having any trouble with chest pain.  No fevers.  Has been coughing slightly.  This morning he was feeling very short of breath and had to call EMS.  They noticed oxygen saturation was 78% on room air.  He was given a DuoNeb and nitroglycerin.  Patient was then started on BiPAP.  He is feeling significantly better now after that.  He denies any history of any chronic lung problems.  Home Medications Prior to Admission medications   Medication Sig Start Date End Date Taking? Authorizing Provider  amLODipine (NORVASC) 5 MG tablet TAKE 1 TABLET DAILY 11/28/21   Burchette, Elberta Fortis, MD  benazepril (LOTENSIN) 20 MG tablet Take 1 tablet (20 mg total) by mouth daily. 10/23/22   Burchette, Elberta Fortis, MD  ciprofloxacin-dexamethasone (CIPRODEX) OTIC suspension Place 4 drops into the right ear 2 (two) times daily. 10/16/22   [place], Betty G, MD  famotidine (PEPCID) 20 MG tablet TAKE 1 TABLET AT BEDTIME 11/11/22   Burchette, Elberta Fortis, MD  finasteride (PROSCAR) 5 MG tablet TAKE ONE TABLET BY MOUTH ONCE DAILY 10/15/22   Burchette, Elberta Fortis, MD  gabapentin (NEURONTIN) 100 MG capsule TAKE 1 CAPSULE 4 TIMES     DAILY 11/11/22   Burchette, Elberta Fortis, MD  magnesium oxide (MAG-OX) 400 MG tablet Take 400 mg by mouth daily.    [provider]  meloxicam (MOBIC) 15 MG tablet TAKE 1 TABLET DAILY 11/11/22   Burchette, Elberta Fortis, MD  polyethylene  glycol (MIRALAX / GLYCOLAX) packet Take 17 g by mouth daily as needed for moderate constipation. Patient taking differently: Take 17 g by mouth 2 (two) times daily. 03/16/17   Glade Lloyd, MD  tamsulosin (FLOMAX) 0.4 MG CAPS capsule Take 1 capsule (0.4 mg total) by mouth daily. 11/21/21   Burchette, Elberta Fortis, MD  vitamin C (ASCORBIC ACID) 500 MG tablet Take 500 mg by mouth daily.    [provider]  VITAMIN D PO Take 2,500 Units by mouth at bedtime.    [provider]      Allergies    Statins    Review of Systems   Review of Systems  Respiratory:  Positive for shortness of breath.     Physical Exam Updated Vital Signs BP 137/72   Pulse (!) 109   Temp 97.6 F (36.4 C) (Oral)   Resp (!) 23   Ht 1.854 m (6\' 1" )   Wt 90.7 kg   SpO2 98%   BMI 26.39 kg/m  Physical Exam Vitals and nursing note reviewed.  Constitutional:      Appearance: He is well-developed. He is not diaphoretic.  HENT:     Head: Normocephalic and atraumatic.     Right Ear: External ear normal.     Left Ear: External ear normal.  Eyes:     General: No scleral icterus.  Right eye: No discharge.        Left eye: No discharge.     Conjunctiva/sclera: Conjunctivae normal.  Neck:     Trachea: No tracheal deviation.  Cardiovascular:     Rate and Rhythm: Normal rate and regular rhythm.  Pulmonary:     Effort: Pulmonary effort is normal. No respiratory distress.     Breath sounds: Normal breath sounds. No stridor. No wheezing or rales.  Abdominal:     General: Bowel sounds are normal. There is no distension.     Palpations: Abdomen is soft.     Tenderness: There is no abdominal tenderness. There is no guarding or rebound.  Musculoskeletal:        General: No tenderness or deformity.     Cervical back: Neck supple.     Right lower leg: Edema present.     Left lower leg: Edema present.     Comments: Edema noted bilateral lower legs, left greater than right, no tenderness  Skin:     General: Skin is warm and dry.     Findings: No rash.  Neurological:     General: No focal deficit present.     Mental Status: He is alert.     Cranial Nerves: No cranial nerve deficit, dysarthria or facial asymmetry.     Sensory: No sensory deficit.     Motor: No abnormal muscle tone or seizure activity.     Coordination: Coordination normal.  Psychiatric:        Mood and Affect: Mood normal.     ED Results / Procedures / Treatments   Labs (all labs ordered are listed, but only abnormal results are displayed) Labs Reviewed  COMPREHENSIVE METABOLIC PANEL - Abnormal; Notable for the following components:      Result Value   Sodium 130 (*)    Chloride 96 (*)    CO2 20 (*)    Glucose, Bld 175 (*)    Total Protein 5.9 (*)    Albumin 2.8 (*)    All other components within normal limits  CBC - Abnormal; Notable for the following components:   RBC 3.54 (*)    Hemoglobin 10.9 (*)    HCT 34.1 (*)    All other components within normal limits  BRAIN NATRIURETIC PEPTIDE - Abnormal; Notable for the following components:   B Natriuretic Peptide 281.1 (*)    All other components within normal limits  D-DIMER, QUANTITATIVE - Abnormal; Notable for the following components:   D-Dimer, Quant 0.78 (*)    All other components within normal limits  RESP PANEL BY RT-PCR (RSV, FLU A&B, COVID)  [id number]    EKG EKG Interpretation Date/Time:  Tuesday December 03 2022 09:08:41 EDT Ventricular Rate:  115 PR Interval:  188 QRS Duration:  88 QT Interval:  318 QTC Calculation: 440 R Axis:   62  Text Interpretation: Sinus tachycardia Anterior infarct, old Minimal ST depression, inferior leads Since last tracing rate faster Confirmed by Linwood Dibbles [phone]) on 12/03/2022 9:14:27 AM  Radiology DG Chest Port 1 View  Result Date: 12/03/2022 CLINICAL DATA:  Dyspnea. EXAM: PORTABLE CHEST 1 VIEW COMPARISON:  Chest radiograph 05/20/2021 FINDINGS: The cardiomediastinal silhouette is unchanged with normal  heart size. Aortic atherosclerosis is noted. New interstitial opacities are present throughout both lungs, and patchy and hazy airspace opacities are present in the perihilar regions and lung bases bilaterally. There are small bilateral pleural effusions. No pneumothorax is identified. No acute osseous abnormality is seen. IMPRESSION: New symmetric  lung opacities favored to reflect edema with small pleural effusions. Electronically Signed   By: Sebastian Ache M.D.   On: 12/03/2022 11:36   VAS [place] LOWER EXTREMITY VENOUS (DVT) (7a-7p)  Result Date: 12/03/2022  Lower Venous DVT Study Patient Name:  ERROLL NORSWORTHY  Date of Exam:   12/03/2022 Medical Rec #: [id number]       Accession #:    [id number] Date of Birth: Apr 26, 1931        Patient Gender: M Patient Age:   57 years Exam Location:  [facility] Procedure:      VAS [place] LOWER EXTREMITY VENOUS (DVT) Referring Phys: Jahnia Hewes --------------------------------------------------------------------------------  Indications: Swelling, and SOB.  Comparison Study: No prior left sided studies Performing Technologist: Marilynne Halsted RDMS, RVT  Examination Guidelines: A complete evaluation includes B-mode imaging, spectral Doppler, color Doppler, and power Doppler as needed of all accessible portions of each vessel. Bilateral testing is considered an integral part of a complete examination. Limited examinations for reoccurring indications may be performed as noted. The reflux portion of the exam is performed with the patient in reverse Trendelenburg.  +-----+---------------+---------+-----------+----------+--------------+ RIGHTCompressibilityPhasicitySpontaneityPropertiesThrombus Aging +-----+---------------+---------+-----------+----------+--------------+ CFV  Full           Yes      Yes                                 +-----+---------------+---------+-----------+----------+--------------+ SFJ  Full                                                         +-----+---------------+---------+-----------+----------+--------------+   +---------+---------------+---------+-----------+----------+--------------+ LEFT     CompressibilityPhasicitySpontaneityPropertiesThrombus Aging +---------+---------------+---------+-----------+----------+--------------+ CFV      Full           Yes      Yes                                 +---------+---------------+---------+-----------+----------+--------------+ SFJ      Full                                                        +---------+---------------+---------+-----------+----------+--------------+ FV Prox  Full                                                        +---------+---------------+---------+-----------+----------+--------------+ FV Mid   Full           Yes      Yes                                 +---------+---------------+---------+-----------+----------+--------------+ FV DistalFull                                                        +---------+---------------+---------+-----------+----------+--------------+  PFV      Full                                                        +---------+---------------+---------+-----------+----------+--------------+ POP      Full           Yes      Yes                                 +---------+---------------+---------+-----------+----------+--------------+ PTV      Full                                                        +---------+---------------+---------+-----------+----------+--------------+ PERO     Full                                                        +---------+---------------+---------+-----------+----------+--------------+    Summary: RIGHT: - No evidence of common femoral vein obstruction.   LEFT: - There is no evidence of deep vein thrombosis in the lower extremity.  - No cystic structure found in the popliteal fossa.  *See table(s) above for measurements and observations.    Preliminary      Procedures Procedures    Medications Ordered in ED Medications  furosemide (LASIX) injection 40 mg (has no administration in time range)  potassium chloride SA (KLOR-CON M) CR tablet 40 mEq (has no administration in time range)  nitroGLYCERIN (NITROGLYN) 2 % ointment 1 inch (has no administration in time range)    ED Course/ Medical Decision Making/ A&P Clinical Course as of 12/03/22 1613  Tue Dec 03, 2022  1202 Covid flu RSV negative [JK]  1202 Chest x-ray shows bilateral opacities more suggestive of edema [JK]  1203 BNP is elevated [JK]  1203 D-dimer is elevated but  [JK]  1203 within age-adjusted normal [JK]  1203 Comprehensive metabolic panel(!) Hyponatremia noted similar to previous [JK]  1203 Doppler study without evidence of DVT [JK]  1244 Case discussed with Dr. Artis Flock regarding admission [JK]    Clinical Course User Index [JK] Linwood Dibbles, MD                                 Medical Decision Making Differential diagnosis includes but not limited to pulmonary embolism, pneumonia, CHF, COPD, pneumothorax  Problems Addressed: Acute pulmonary edema (HCC): acute illness or injury that poses a threat to life or bodily functions Hyponatremia: chronic illness or injury  Amount and/or Complexity of Data Reviewed Labs: ordered. Decision-making details documented in ED Course. Radiology: ordered and independent interpretation performed.  Risk Prescription drug management. Decision regarding hospitalization.   Patient presented to ED with complaints of shortness of breath.  Patient does require supplemental oxygen.  D-dimer slightly elevated in the ED but within age-adjusted normal range.  Doubt pulmonary embolism.  X-ray does show abnormalities that would account for his difficulty  breathing and new oxygen requirement.  BNP slightly elevated but x-ray is more suggestive of pulmonary edema rather than pneumonia.  Patient also has increasing edema.  Doppler study was  performed and he does not have an evidence of DVT.  Will start the patient on Lasix and nitroglycerin.  With his oxygen requirement I will consult the medical service for admission.        Final Clinical Impression(s) / ED Diagnoses Final diagnoses:  Acute pulmonary edema (HCC)  Hyponatremia    Rx / DC Orders ED Discharge Orders     None         Linwood Dibbles, MD 12/03/22 [phone]

## 2022-12-03 NOTE — Assessment & Plan Note (Signed)
Continue lotensin 20mg  Hold norvasc until echo results

## 2022-12-03 NOTE — ED Notes (Signed)
Dr Artis Flock made aware of critical troponin level of 1453

## 2022-12-03 NOTE — ED Notes (Signed)
RT at bedside assessing pt breathing and switching pt over from CPAP to Bipap. Pt remains on monitor at this time.

## 2022-12-04 ENCOUNTER — Inpatient Hospital Stay (HOSPITAL_COMMUNITY): Payer: Medicare Other

## 2022-12-04 DIAGNOSIS — I10 Essential (primary) hypertension: Secondary | ICD-10-CM

## 2022-12-04 DIAGNOSIS — I509 Heart failure, unspecified: Secondary | ICD-10-CM

## 2022-12-04 DIAGNOSIS — E871 Hypo-osmolality and hyponatremia: Secondary | ICD-10-CM

## 2022-12-04 DIAGNOSIS — N400 Enlarged prostate without lower urinary tract symptoms: Secondary | ICD-10-CM

## 2022-12-04 DIAGNOSIS — D649 Anemia, unspecified: Secondary | ICD-10-CM

## 2022-12-04 DIAGNOSIS — J9601 Acute respiratory failure with hypoxia: Secondary | ICD-10-CM | POA: Diagnosis not present

## 2022-12-04 DIAGNOSIS — K219 Gastro-esophageal reflux disease without esophagitis: Secondary | ICD-10-CM

## 2022-12-04 LAB — COMPREHENSIVE METABOLIC PANEL
ALT: 19 U/L (ref 0–44)
AST: 42 U/L — ABNORMAL HIGH (ref 15–41)
Albumin: 2.8 g/dL — ABNORMAL LOW (ref 3.5–5.0)
Alkaline Phosphatase: 70 U/L (ref 38–126)
Anion gap: 12 (ref 5–15)
BUN: 18 mg/dL (ref 8–23)
CO2: 25 mmol/L (ref 22–32)
Calcium: 9.5 mg/dL (ref 8.9–10.3)
Chloride: 97 mmol/L — ABNORMAL LOW (ref 98–111)
Creatinine, Ser: 0.92 mg/dL (ref 0.61–1.24)
GFR, Estimated: >60 mL/min (ref >60)
Glucose, Bld: 119 mg/dL — ABNORMAL HIGH (ref 70–99)
Potassium: 4.5 mmol/L (ref 3.5–5.1)
Sodium: 134 mmol/L — ABNORMAL LOW (ref 135–145)
Total Bilirubin: 0.9 mg/dL (ref 0.3–1.2)
Total Protein: 6.4 g/dL — ABNORMAL LOW (ref 6.5–8.1)

## 2022-12-04 LAB — CBC WITH DIFFERENTIAL/PLATELET
Abs Immature Granulocytes: 0.04 10*3/uL (ref 0.00–0.07)
Basophils Absolute: 0 10*3/uL (ref 0.0–0.1)
Basophils Relative: 0 %
Eosinophils Absolute: 0 10*3/uL (ref 0.0–0.5)
Eosinophils Relative: 0 %
HCT: 33.6 % — ABNORMAL LOW (ref 39.0–52.0)
Hemoglobin: 11 g/dL — ABNORMAL LOW (ref 13.0–17.0)
Immature Granulocytes: 1 %
Lymphocytes Relative: 9 %
Lymphs Abs: 0.7 10*3/uL (ref 0.7–4.0)
MCH: 30.7 pg (ref 26.0–34.0)
MCHC: 32.7 g/dL (ref 30.0–36.0)
MCV: 93.9 fL (ref 80.0–100.0)
Monocytes Absolute: 1.1 10*3/uL — ABNORMAL HIGH (ref 0.1–1.0)
Monocytes Relative: 14 %
Neutro Abs: 5.8 10*3/uL (ref 1.7–7.7)
Neutrophils Relative %: 76 %
Platelets: 322 10*3/uL (ref 150–400)
RBC: 3.58 MIL/uL — ABNORMAL LOW (ref 4.22–5.81)
RDW: 12.7 % (ref 11.5–15.5)
WBC: 7.6 10*3/uL (ref 4.0–10.5)
nRBC: 0 % (ref 0.0–0.2)

## 2022-12-04 LAB — HEPARIN LEVEL (UNFRACTIONATED)
Heparin Unfractionated: 0.33 [IU]/mL (ref 0.30–0.70)
Heparin Unfractionated: 0.18 [IU]/mL — ABNORMAL LOW (ref 0.30–0.70)
Heparin Unfractionated: 0.47 [IU]/mL (ref 0.30–0.70)

## 2022-12-04 LAB — ECHOCARDIOGRAM COMPLETE
Height: 73 in
P 1/2 time: 291 ms
S' Lateral: 4 cm
Weight: 3199.32 [oz_av]

## 2022-12-04 LAB — LIPID PANEL
Cholesterol: 208 mg/dL — ABNORMAL HIGH (ref 0–200)
HDL: 85 mg/dL (ref >40)
LDL Cholesterol: 113 mg/dL — ABNORMAL HIGH (ref 0–99)
Total CHOL/HDL Ratio: 2.4 ratio
Triglycerides: 48 mg/dL (ref <150)
VLDL: 10 mg/dL (ref 0–40)

## 2022-12-04 LAB — MAGNESIUM: Magnesium: 2.1 mg/dL (ref 1.7–2.4)

## 2022-12-04 LAB — TSH: TSH: 4.762 u[IU]/mL — ABNORMAL HIGH (ref 0.350–4.500)

## 2022-12-04 MED ORDER — METOPROLOL TARTRATE 5 MG/5ML IV SOLN
5.0000 mg | Freq: Once | INTRAVENOUS | Status: AC
  Administered 2022-12-04: 5 mg via INTRAVENOUS
  Filled 2022-12-04: qty 5

## 2022-12-04 MED ORDER — METOPROLOL SUCCINATE ER 25 MG PO TB24
25.0000 mg | ORAL_TABLET | Freq: Every day | ORAL | Status: DC
  Administered 2022-12-05 – 2022-12-06 (×2): 25 mg via ORAL
  Filled 2022-12-04 (×2): qty 1

## 2022-12-04 MED ORDER — AMIODARONE HCL 200 MG PO TABS
200.0000 mg | ORAL_TABLET | Freq: Two times a day (BID) | ORAL | Status: DC
  Administered 2022-12-04 – 2022-12-06 (×5): 200 mg via ORAL
  Filled 2022-12-04 (×5): qty 1

## 2022-12-04 NOTE — Consult Note (Signed)
Ref: [name], MD   Subjective:  Awake. Short of breath in AM, improving post IV lasix. Patient is aware of MI and poor LV systolic function and refuses cardiac catheterization.  Objective:  Vital Signs in the last 24 hours: Temp:  [98 F (36.7 C)-98.6 F (37 C)] 98.4 F (36.9 C) (09/11 2025) Pulse Rate:  [77-114] 85 (09/11 2025) Cardiac Rhythm: Atrial flutter (09/11 1905) Resp:  [16-21] 16 (09/11 2025) BP: (91-126)/(41-81) 120/58 (09/11 2025) SpO2:  [91 %-97 %] 93 % (09/11 2025) Weight:  [90.7 kg-91.1 kg] 90.7 kg (09/11 0441)  Physical Exam: BP Readings from Last 1 Encounters:  12/04/22 (!) 120/58     Wt Readings from Last 1 Encounters:  12/04/22 90.7 kg    Weight change:  Body mass index is 26.38 kg/m. HEENT: [place]/AT, Eyes-Blue, Conjunctiva-Pink, Sclera-Non-icteric Neck: No JVD, No bruit, Trachea midline. Lungs:  Clearing, Bilateral. Cardiac:  Irregular rhythm, normal S1 and S2, no S3. II/VI systolic murmur. Abdomen:  Soft, non-tender. BS present. Extremities:  1 + edema present. No cyanosis. No clubbing. CNS: AxOx3, Cranial nerves grossly intact, moves all 4 extremities.  Skin: Warm and dry.   Intake/Output from previous day: 09/10 0701 - 09/11 0700 In: 157.6 [I.V.:157.6] Out: 4100 [Urine:4100]    Lab Results: BMET    Component Value Date/Time   NA 134 (L) 12/04/2022 0331   NA 130 (L) 12/03/2022 1358   NA 130 (L) 12/03/2022 0956   K 4.5 12/04/2022 0331   K 4.7 12/03/2022 1358   K 4.6 12/03/2022 0956   CL 97 (L) 12/04/2022 0331   CL 96 (L) 12/03/2022 0956   CL 88 (L) 10/22/2022 0907   CO2 25 12/04/2022 0331   CO2 20 (L) 12/03/2022 0956   CO2 27 10/22/2022 0907   GLUCOSE 119 (H) 12/04/2022 0331   GLUCOSE 175 (H) 12/03/2022 0956   GLUCOSE 113 (H) 10/22/2022 0907   BUN 18 12/04/2022 0331   BUN 20 12/03/2022 0956   BUN 14 10/22/2022 0907   CREATININE 0.92 12/04/2022 0331   CREATININE 0.93 12/03/2022 0956   CREATININE 0.86 10/22/2022 0907    CREATININE 0.73 02/25/2020 0900   CALCIUM 9.5 12/04/2022 0331   CALCIUM 9.2 12/03/2022 0956   CALCIUM 10.1 10/22/2022 0907   GFRNONAA >60 12/04/2022 0331   GFRNONAA >60 12/03/2022 0956   GFRNONAA >60 05/24/2021 0258   GFRAA >60 03/16/2017 0523   GFRAA >60 03/15/2017 0514   GFRAA >60 03/14/2017 0426   CBC    Component Value Date/Time   WBC 7.6 12/04/2022 0331   RBC 3.58 (L) 12/04/2022 0331   HGB 11.0 (L) 12/04/2022 0331   HCT 33.6 (L) 12/04/2022 0331   PLT 322 12/04/2022 0331   MCV 93.9 12/04/2022 0331   MCH 30.7 12/04/2022 0331   MCHC 32.7 12/04/2022 0331   RDW 12.7 12/04/2022 0331   LYMPHSABS 0.7 12/04/2022 0331   MONOABS 1.1 (H) 12/04/2022 0331   EOSABS 0.0 12/04/2022 0331   BASOSABS 0.0 12/04/2022 0331   HEPATIC Function Panel Recent Labs    10/22/22 0907 12/03/22 0956 12/04/22 0331  PROT 7.2 5.9* 6.4*  ALBUMIN 4.1 2.8* 2.8*  AST 17 21 42*  ALT 15 18 19   ALKPHOS 89 74 70   HEMOGLOBIN A1C Lab Results  Component Value Date   MPG 116.89 05/23/2021   CARDIAC ENZYMES Lab Results  Component Value Date   TROPONINI <0.30 03/30/2012   TROPONINI <0.30 03/30/2012   TROPONINI <0.30 03/29/2012   BNP  No results for input(s): "PROBNP" in the last 8760 hours. TSH Recent Labs    12/04/22 0331  TSH 4.762*   CHOLESTEROL Recent Labs    12/04/22 0331  CHOL 208*    Scheduled Meds:  amiodarone  200 mg Oral BID   benazepril  20 mg Oral Daily   ezetimibe  10 mg Oral Daily   famotidine  20 mg Oral QHS   finasteride  5 mg Oral Daily   furosemide  40 mg Intravenous Daily   gabapentin  200 mg Oral BID   magnesium oxide  400 mg Oral Daily   nitroGLYCERIN  1 inch Topical Q6H   potassium chloride  10 mEq Oral Daily   sodium chloride flush  3 mL Intravenous Q12H   Continuous Infusions:  sodium chloride     heparin 1,450 Units/hr (12/04/22 1441)   PRN Meds:.sodium chloride, acetaminophen **OR** acetaminophen, polyethylene glycol, sodium chloride  flush  Assessment/Plan: Acute ASW MI Acute systolic left heart failure HTN HLD Hyperglycemia Hyponatremia, improving  Plan: Continue medical treatment.   LOS: 1 day   Time spent including chart review, lab review, examination, discussion with patient : 30 min   Orpah Cobb  MD  12/04/2022, 9:52 PM

## 2022-12-04 NOTE — Progress Notes (Signed)
  Echocardiogram 2D Echocardiogram has been performed.  Delcie Roch 12/04/2022, 9:52 AM

## 2022-12-04 NOTE — Progress Notes (Signed)
ANTICOAGULATION CONSULT NOTE  Pharmacy Consult for heparin Indication: chest pain/ACS  Allergies  Allergen Reactions   Statins Other (See Comments)    Muscle pain and upset stomach  Other Reaction(s): leg pain, stoamch    Patient Measurements: Height: 6\' 1"  (185.4 cm) Weight: 90.7 kg (199 lb 15.3 oz) IBW/kg (Calculated) : 79.9 Heparin Dosing Weight: 90kg  Vital Signs: Temp: 98.2 F (36.8 C) (09/11 1254) Temp Source: Oral (09/11 1254) BP: 91/53 (09/11 1254) Pulse Rate: 81 (09/11 1254)  Labs: Recent Labs    12/03/22 0956 12/03/22 1241 12/03/22 1358 12/03/22 1441 12/04/22 0331 12/04/22 1150  HGB 10.9*  --  11.2*  --  11.0*  --   HCT 34.1*  --  33.0*  --  33.6*  --   PLT 297  --   --   --  322  --   HEPARINUNFRC  --   --   --   --  0.33 0.18*  CREATININE 0.93  --   --   --  0.92  --   TROPONINIHS  --  1,453*  --  2,250*  --   --     Estimated Creatinine Clearance: 61.5 mL/min (by C-G formula based on SCr of 0.92 mg/dL).   Medical History: Past Medical History:  Diagnosis Date   Arthritis    DUPUYTREN'S CONTRACTURE, RIGHT 07/04/2009   Annotation: 4th digit Qualifier: Diagnosis of  By: Lovell Sheehan MD, Balinda Quails    GERD (gastroesophageal reflux disease)    H/O hiatal hernia    "FOR YEARS" " NO PROBLEM"   Hyperlipidemia    Hypertension    Knee pain    Neuropathy    PERIPHERAL    Osteoporosis    PMR (polymyalgia rheumatica) (HCC)    Polymyalgia (HCC) 5 YEARS   Assessment: 60 YOM presenting with SOB, elevated troponin, he is not on anticoagulation PTA, chronic anemia stable, plts wnl.  Heparin level resulted Sub-therapeutic (0.18) on infusion at 1200 units/hr. Per RN, no issues with infusion or bleeding.  Goal of Therapy:  Heparin level 0.3-0.7 units/ml Monitor platelets by anticoagulation protocol: Yes   Plan:  Increase heparin infusion to 1450 units/hr Check HL in 8 hours and daily while on heparin Continue to monitor H&H and platelets   Thank you for  allowing pharmacy to be a part of this patient's care.   Signe Colt, PharmD 12/04/2022 2:31 PM  **Pharmacist phone directory can be found on [email or web address] listed under [facility]**

## 2022-12-04 NOTE — Progress Notes (Signed)
ANTICOAGULATION CONSULT NOTE  Pharmacy Consult for heparin Indication: chest pain/ACS  Allergies  Allergen Reactions   Statins Other (See Comments)    Muscle pain and upset stomach  Other Reaction(s): leg pain, stoamch    Patient Measurements: Height: 6\' 1"  (185.4 cm) Weight: 91.1 kg (200 lb 13.4 oz) IBW/kg (Calculated) : 79.9 Heparin Dosing Weight: 90kg  Vital Signs: Temp: 98.5 F (36.9 C) (09/11 0026) Temp Source: Oral (09/11 0026) BP: 117/81 (09/11 0026) Pulse Rate: 77 (09/11 0026)  Labs: Recent Labs    12/03/22 0956 12/03/22 1241 12/03/22 1358 12/03/22 1441 12/04/22 0331  HGB 10.9*  --  11.2*  --  11.0*  HCT 34.1*  --  33.0*  --  33.6*  PLT 297  --   --   --  322  HEPARINUNFRC  --   --   --   --  0.33  CREATININE 0.93  --   --   --   --   TROPONINIHS  --  1,453*  --  2,250*  --     Estimated Creatinine Clearance: 60.9 mL/min (by C-G formula based on SCr of 0.93 mg/dL).   Medical History: Past Medical History:  Diagnosis Date   Arthritis    DUPUYTREN'S CONTRACTURE, RIGHT 07/04/2009   Annotation: 4th digit Qualifier: Diagnosis of  By: Lovell Sheehan MD, Balinda Quails    GERD (gastroesophageal reflux disease)    H/O hiatal hernia    "FOR YEARS" " NO PROBLEM"   Hyperlipidemia    Hypertension    Knee pain    Neuropathy    PERIPHERAL    Osteoporosis    PMR (polymyalgia rheumatica) (HCC)    Polymyalgia (HCC) 5 YEARS   Assessment: 26 YOM presenting with SOB, elevated troponin, he is not on anticoagulation PTA, chronic anemia stable, plts wnl.  Heparin level therapeutic (0.33) on infusion at 1200 units/hr. No bleeding noted.  Goal of Therapy:  Heparin level 0.3-0.7 units/ml Monitor platelets by anticoagulation protocol: Yes   Plan:  Continue heparin infusion at 1200 units/hr F/u 6 hr confirmatory heparin level  Christoper Fabian, PharmD, BCPS Please see amion for complete clinical pharmacist phone list 12/04/2022 4:42 AM

## 2022-12-04 NOTE — Progress Notes (Signed)
ANTICOAGULATION CONSULT NOTE- Follow Up  Pharmacy Consult for heparin Indication: chest pain/ACS  Allergies  Allergen Reactions   Statins Other (See Comments)    Muscle pain and upset stomach  Other Reaction(s): leg pain, stoamch    Patient Measurements: Height: 6\' 1"  (185.4 cm) Weight: 90.7 kg (199 lb 15.3 oz) IBW/kg (Calculated) : 79.9 Heparin Dosing Weight: 90kg  Vital Signs: Temp: 98.4 F (36.9 C) (09/11 2025) Temp Source: Oral (09/11 2025) BP: 120/58 (09/11 2025) Pulse Rate: 85 (09/11 2025)  Labs: Recent Labs    12/03/22 0956 12/03/22 1241 12/03/22 1358 12/03/22 1441 12/04/22 0331 12/04/22 1150 12/04/22 2255  HGB 10.9*  --  11.2*  --  11.0*  --   --   HCT 34.1*  --  33.0*  --  33.6*  --   --   PLT 297  --   --   --  322  --   --   HEPARINUNFRC  --   --   --   --  0.33 0.18* 0.47  CREATININE 0.93  --   --   --  0.92  --   --   TROPONINIHS  --  1,453*  --  2,250*  --   --   --     Estimated Creatinine Clearance: 61.5 mL/min (by C-G formula based on SCr of 0.92 mg/dL).   Medical History: Past Medical History:  Diagnosis Date   Arthritis    DUPUYTREN'S CONTRACTURE, RIGHT 07/04/2009   Annotation: 4th digit Qualifier: Diagnosis of  By: Lovell Sheehan MD, Balinda Quails    GERD (gastroesophageal reflux disease)    H/O hiatal hernia    "FOR YEARS" " NO PROBLEM"   Hyperlipidemia    Hypertension    Knee pain    Neuropathy    PERIPHERAL    Osteoporosis    PMR (polymyalgia rheumatica) (HCC)    Polymyalgia (HCC) 5 YEARS   Assessment: 73 YOM presenting with SOB, elevated troponin, he is not on anticoagulation PTA, chronic anemia stable, plts wnl.  Heparin level resulted Sub-therapeutic (0.18) on infusion at 1200 units/hr. Per RN, no issues with infusion or bleeding.  9/11 PM: heparin level returned at 0.47 on 1450 units/hr (therapeutic). No signs/symptoms of bleeding reported or issues with the heparin infusion  Goal of Therapy:  Heparin level 0.3-0.7 units/ml Monitor  platelets by anticoagulation protocol: Yes   Plan:  Continue heparin infusion to 1450 units/hr Check confirmatory heparin level in 8 hours and daily while on heparin Continue to monitor H&H and platelets  Thank you for allowing pharmacy to be a part of this patient's care.   Arabella Merles, PharmD. Clinical Pharmacist 12/04/2022 11:27 PM

## 2022-12-04 NOTE — Plan of Care (Signed)

## 2022-12-04 NOTE — Progress Notes (Signed)
PROGRESS NOTE    Darin [name]  ZOX:[id number] DOB: 1931/06/19 DOA: 12/03/2022 PCP: [name], MD    Brief Narrative:  Darin [name] is a 89 y.o. male with medical history significant of GERD, HTN, HLD, neuropathy, PMR presented to hospital with shortness of breath for 1 day with dry cough and dyspnea on exertion even on minimal ambulation.  In the ED patient was noted to be hypoxic with pulse ox of 78% on room air and was put on CPAP.  In the ED, patient was tachycardic.  COVID flu and RSV was negative.  Labs showed hyponatremia with sodium of 130.  Chest x-ray showed new symmetric infiltrates in both lungs with small pleural effusions.  Patient was given 40 mg of IV Lasix placed on CPAP and was admitted hospital for further evaluation and treatment.    Assessment and Plan:  * Acute respiratory failure with hypoxia likely secondary to congestive heart failure exacerbation with possible NST elevation MI. Chest x-ray showed pulmonary edema with increased BNP patient had increased leg swelling and dyspnea suggestive of congestive heart failure exacerbation.  No chest pain.  CT scan of the chest showed groundglass opacities with septal thickening suggestive of pulmonary edema with bilateral pleural effusion.  Ultrasound of the lower extremity was negative for DVT.  Will continue to wean oxygen as able.  On nasal canula 2 Litre per minute. Continue IV lasix daily, closely monitor..  Elevated troponin/likely NSTEMI Initial troponin >1000, denies any chest pain.  EKG without any acute changes.  Cardiology on board.  Patient is allergic to statin.  On heparin drip.  Patient has declined cardiac catheterization.  Lipitor was added.  Check 2D echocardiogram.    CHF exacerbation Review of previous 2D echocardiogram from 2023 with a normal LV ejection fraction.  Continue intake and output charting Daily weights.  Received 40 mg of Lasix yesterday .continue 40 mg Lasix daily.  Check 2D  echocardiogram.  solitary pulmonary nodule in the right upper lobe- Noted on the CT scan.  5 mm in size.  Follow-up as outpatient.   Normocytic anemia Ferritin level of 56.  Iron level low at 26.  Vitamin B12 level elevated.  Will continue to monitor.   Hyponatremia Mild likely at baseline.  Improved with diuresis.   Essential hypertension On Lotensin at home.  Norvasc on hold.    GERD (gastroesophageal reflux disease) Continue Pepcid   BPH (benign prostatic hyperplasia) Continue proscar and flomax      DVT prophylaxis: Place TED hose Start: 12/03/22 2324, heparin drip.   Code Status:     Code Status: Limited: Do not attempt resuscitation (DNR) -DNR-LIMITED -Do Not Intubate/DNI   Disposition: Home likely in 1 to 2 days, when medically optimized and ok with cardiology  Status is: Inpatient Remains inpatient appropriate because: Congestive heart failure, IV diuretics, still symptomatic, IV heparin drip   Family Communication: None at bedside.  Consultants:  Cardiology  Procedures:  None  Antimicrobials:  None  Anti-infectives (From admission, onward)    None        Subjective: Today, patient was seen and examined at bedside.  Seen during echocardiogram.  Complains of mild shortness of breath.  Denies any chest pain, dizziness, lightheadedness, cough or fever.  Objective: Vitals:   12/04/22 0727 12/04/22 0801 12/04/22 1000 12/04/22 1010  BP: (!) 105/59 (!) 126/41    Pulse: 91 99    Resp: 20 (!) 21 18 20   Temp: 98.5 F (36.9 C) 98.4 F (36.9 C)  TempSrc: Oral Oral    SpO2: 97% 95%    Weight:      Height:        Intake/Output Summary (Last 24 hours) at 12/04/2022 1044 Last data filed at 12/04/2022 0503 Gross per 24 hour  Intake 157.55 ml  Output 4100 ml  Net -3942.45 ml   Filed Weights   12/03/22 0910 12/04/22 0028 12/04/22 0441  Weight: 90.7 kg 91.1 kg 90.7 kg    Physical Examination: Body mass index is 26.38 kg/m.  General:  Average  built, not in obvious distress, elderly male, on nasal cannula oxygen HENT:   No scleral pallor or icterus noted. Oral mucosa is moist.  Chest:  Clear breath sounds.   CVS: S1 &S2 heard. Systolic murmer noted.   Irregular rhythm. Abdomen: Soft, nontender, nondistended.  Bowel sounds are heard.   Extremities: No cyanosis, clubbing with ++ trace edema.   Psych: Alert, awake and oriented, normal mood CNS:  No cranial nerve deficits.  Power equal in all extremities.   Skin: Warm and dry.  No rashes noted.  Data Reviewed:   CBC: Recent Labs  Lab 12/03/22 0956 12/03/22 1358 12/04/22 0331  WBC 10.4  --  7.6  NEUTROABS  --   --  5.8  HGB 10.9* 11.2* 11.0*  HCT 34.1* 33.0* 33.6*  MCV 96.3  --  93.9  PLT 297  --  322    Basic Metabolic Panel: Recent Labs  Lab 12/03/22 0956 12/03/22 1358 12/04/22 0331  NA 130* 130* 134*  K 4.6 4.7 4.5  CL 96*  --  97*  CO2 20*  --  25  GLUCOSE 175*  --  119*  BUN 20  --  18  CREATININE 0.93  --  0.92  CALCIUM 9.2  --  9.5  MG  --   --  2.1    Liver Function Tests: Recent Labs  Lab 12/03/22 0956 12/04/22 0331  AST 21 42*  ALT 18 19  ALKPHOS 74 70  BILITOT 0.9 0.9  PROT 5.9* 6.4*  ALBUMIN 2.8* 2.8*     Radiology Studies: VAS [place] LOWER EXTREMITY VENOUS (DVT) (7a-7p)  Result Date: 12/03/2022  Lower Venous DVT Study Patient Name:  Darin [name]  Date of Exam:   12/03/2022 Medical Rec #: [id number]       Accession #:    [id number] Date of Birth: 1931/06/28        Patient Gender: M Patient Age:   53 years Exam Location:  [facility] Procedure:      VAS [place] LOWER EXTREMITY VENOUS (DVT) Referring Phys: JON KNAPP --------------------------------------------------------------------------------  Indications: Swelling, and SOB.  Comparison Study: No prior left sided studies Performing Technologist: Marilynne Halsted RDMS, RVT  Examination Guidelines: A complete evaluation includes B-mode imaging, spectral Doppler, color Doppler, and power Doppler  as needed of all accessible portions of each vessel. Bilateral testing is considered an integral part of a complete examination. Limited examinations for reoccurring indications may be performed as noted. The reflux portion of the exam is performed with the patient in reverse Trendelenburg.  +-----+---------------+---------+-----------+----------+--------------+ RIGHTCompressibilityPhasicitySpontaneityPropertiesThrombus Aging +-----+---------------+---------+-----------+----------+--------------+ CFV  Full           Yes      Yes                                 +-----+---------------+---------+-----------+----------+--------------+ SFJ  Full                                                        +-----+---------------+---------+-----------+----------+--------------+   +---------+---------------+---------+-----------+----------+--------------+  LEFT     CompressibilityPhasicitySpontaneityPropertiesThrombus Aging +---------+---------------+---------+-----------+----------+--------------+ CFV      Full           Yes      Yes                                 +---------+---------------+---------+-----------+----------+--------------+ SFJ      Full                                                        +---------+---------------+---------+-----------+----------+--------------+ FV Prox  Full                                                        +---------+---------------+---------+-----------+----------+--------------+ FV Mid   Full           Yes      Yes                                 +---------+---------------+---------+-----------+----------+--------------+ FV DistalFull                                                        +---------+---------------+---------+-----------+----------+--------------+ PFV      Full                                                        +---------+---------------+---------+-----------+----------+--------------+ POP       Full           Yes      Yes                                 +---------+---------------+---------+-----------+----------+--------------+ PTV      Full                                                        +---------+---------------+---------+-----------+----------+--------------+ PERO     Full                                                        +---------+---------------+---------+-----------+----------+--------------+    Summary: RIGHT: - No evidence of common femoral vein obstruction.   LEFT: - There is no evidence of deep vein thrombosis in the lower extremity.  - No cystic structure found in the popliteal fossa.  *See table(s) above for measurements and observations. Electronically signed by Lemar Livings MD  on 12/03/2022 at 5:39:46 PM.    Final    CT Chest High Resolution  Result Date: 12/03/2022 CLINICAL DATA:  Shortness of breath EXAM: CT CHEST WITHOUT CONTRAST TECHNIQUE: Multidetector CT imaging of the chest was performed following the standard protocol without intravenous contrast. High resolution imaging of the lungs, as well as inspiratory and expiratory imaging, was performed. RADIATION DOSE REDUCTION: This exam was performed according to the departmental dose-optimization program which includes automated exposure control, adjustment of the mA and/or kV according to patient size and/or use of iterative reconstruction technique. COMPARISON:  None Available. FINDINGS: Cardiovascular: Normal heart size. No pericardial effusion. Normal caliber thoracic aorta with moderate calcified plaque. Severe left main and three-vessel coronary artery calcifications. Mediastinum/Nodes: Small hiatal hernia. Thyroid is unremarkable. No enlarged lymph nodes seen in the chest. Lungs/Pleura: Moderate bronchial wall thickening. Patchy central predominant ground-glass opacities and septal thickening. Moderate bilateral pleural effusions and atelectasis. Solid pulmonary nodule of the right upper lobe  measuring 5 mm on series 6, image 47. Upper Abdomen: Partially visualized simple appearing cyst of the left kidney, no specific follow-up imaging is necessary. No acute abnormality. Musculoskeletal: No chest wall mass or suspicious bone lesions identified. IMPRESSION: 1. Patchy central predominant ground-glass opacities and septal thickening, findings are consistent with pulmonary edema. 2. Moderate bilateral pleural effusions and atelectasis. 3. Solid pulmonary nodule of the right upper lobe measuring 5 mm. No follow-up needed if patient is low-risk.This recommendation follows the consensus statement: Guidelines for Management of Incidental Pulmonary Nodules Detected on CT Images: From the Fleischner Society 2017; Radiology 2017; 284:228-243. 4. Coronary artery calcifications and aortic Atherosclerosis (ICD10-I70.0). Electronically Signed   By: Allegra Lai M.D.   On: 12/03/2022 16:17   DG Chest Port 1 View  Result Date: 12/03/2022 CLINICAL DATA:  Dyspnea. EXAM: PORTABLE CHEST 1 VIEW COMPARISON:  Chest radiograph 05/20/2021 FINDINGS: The cardiomediastinal silhouette is unchanged with normal heart size. Aortic atherosclerosis is noted. New interstitial opacities are present throughout both lungs, and patchy and hazy airspace opacities are present in the perihilar regions and lung bases bilaterally. There are small bilateral pleural effusions. No pneumothorax is identified. No acute osseous abnormality is seen. IMPRESSION: New symmetric lung opacities favored to reflect edema with small pleural effusions. Electronically Signed   By: Sebastian Ache M.D.   On: 12/03/2022 11:36      LOS: 1 day    Joycelyn Das, MD [facility] Available via Epic secure chat 7am-7pm After these hours, please refer to coverage provider listed on [email or web address] 12/04/2022, 10:44 AM

## 2022-12-04 NOTE — TOC Initial Note (Addendum)
Transition of Care [facility]) - Initial/Assessment Note    Patient Details  Name: Darin [name] MRN: [id number] Date of Birth: 12/18/1931  Transition of Care [facility]) CM/SW Contact:    [name], RN Phone Number: 12/04/2022, 3:59 PM  Clinical Narrative:                 From home alone, has PCP and insurance on file, states has no HH services in place at this time  but he does have an aide, he has a walker and a w/chair at home.  States will need ambulance transport home at dc and he has no family support here, his family lives out of town.  States gets [facility] from [facility] from [facility] and [employer]  Pta ambulatory with walker.   Expected Discharge Plan: Home w Home Health Services Barriers to Discharge: Continued Medical Work up   Patient Goals and CMS Choice Patient states their goals for this hospitalization and ongoing recovery are:: return home   Choice offered to / list presented to : NA      Expected Discharge Plan and Services In-house Referral: NA Discharge Planning Services: CM Consult Post Acute Care Choice: NA Living arrangements for the past 2 months: Single Family Home                   DME Agency: NA       HH Arranged: NA          Prior Living Arrangements/Services Living arrangements for the past 2 months: Single Family Home Lives with:: Self Patient language and need for interpreter reviewed:: Yes        Need for Family Participation in Patient Care: No (Comment) Care giver support system in place?: No (comment) Current home services: DME (walker, w/chair) Criminal Activity/Legal Involvement Pertinent to Current Situation/Hospitalization: No - Comment as needed  Activities of Daily Living Home Assistive Devices/Equipment: Environmental consultant (specify type), Wheelchair ADL Screening (condition at time of admission) Patient's cognitive ability adequate to safely complete daily activities?: Yes Is the patient deaf or have  difficulty hearing?: No Does the patient have difficulty seeing, even when wearing glasses/contacts?: No Does the patient have difficulty concentrating, remembering, or making decisions?: No Patient able to express need for assistance with ADLs?: Yes Does the patient have difficulty dressing or bathing?: No Independently performs ADLs?: Yes (appropriate for developmental age) Does the patient have difficulty walking or climbing stairs?: Yes Weakness of Legs: None Weakness of Arms/Hands: None  Permission Sought/Granted Permission sought to share information with : Case Manager Permission granted to share information with : Yes, Verbal Permission Granted              Emotional Assessment Appearance:: Appears stated age Attitude/Demeanor/Rapport: Engaged Affect (typically observed): Appropriate Orientation: : Oriented to Self, Oriented to Place, Oriented to  Time, Oriented to Situation Alcohol / Substance Use: Not Applicable Psych Involvement: No (comment)  Admission diagnosis:  Hyponatremia [E87.1] Acute pulmonary edema (HCC) [J81.0] Acute respiratory failure with hypoxia (HCC) [J96.01] Patient Active Problem List   Diagnosis Date Noted   Acute respiratory failure with hypoxia (HCC) 12/03/2022   CHF exacerbation (HCC) 12/03/2022   Normocytic anemia 12/03/2022   Elevated troponin/likely NSTEMI 12/03/2022   Trigger finger, right middle finger 07/10/2022   Chronic heel pain, right 11/28/2021   Tibia/fibula fracture, right, closed, initial encounter 05/20/2021   Callus 04/27/2021   Arthritis of right ankle 10/31/2020   Trigger finger, left ring finger 09/15/2020   Greater  trochanteric bursitis of left hip 02/12/2019   Strain of left piriformis muscle 10/05/2018   Pain due to onychomycosis of toenails of both feet 09/23/2018   Chronic left SI joint pain 04/27/2018   Right wrist pain 02/05/2018   Arthritis of right knee 05/20/2017   Leg edema, right 02/20/2015   BPH (benign  prostatic hyperplasia) 12/09/2014   Chronic radicular low back pain 08/06/2012   Syncope 03/29/2012   Hyponatremia 03/29/2012   Localized osteoarthrosis not specified whether primary or secondary, lower leg 06/20/2010   VARICOSE VEINS LOWER EXTREMITIES W/INFLAMMATION 02/12/2010   SEC LOCALIZED OSTEOARTHROSIS INVOLVING HAND 10/16/2009   DEGENERATIVE DISC DISEASE, LUMBOSACRAL SPINE W/RADICULOPATHY 06/19/2009   CARCINOMA, BASAL CELL 02/07/2009   CATARACT ASSOCIATED WITH OTHER SYNDROMES 08/01/2008   DRY SKIN 08/01/2008   CERUMEN IMPACTION, BILATERAL 03/01/2008   CONSTIPATION, DRUG INDUCED 12/28/2007   POLYNEUROPATHY OTHER DISEASES CLASSIFIED ELSW 09/24/2007   Hyperlipidemia 09/22/2006   Essential hypertension 09/22/2006   GERD (gastroesophageal reflux disease) 09/22/2006   Osteoarthritis 09/22/2006   PCP:  [name], MD Pharmacy:   [facility] [place], [place] - [address] [place] [address] Phone: [phone] Fax: [phone]  [facility] - [place], [place] - [address] [facility] AT Portal to Registered [facility] [address] [place] [address] Phone: [phone] Fax: [phone]     Social Determinants of Health (SDOH) Social History: SDOH Screenings   Food Insecurity: No Food Insecurity (12/03/2022)  Housing: Low Risk  (12/03/2022)  Transportation Needs: No Transportation Needs (12/03/2022)  Utilities: Not At Risk (12/03/2022)  Alcohol Screen: Low Risk  (08/30/2022)  Depression (PHQ2-9): Low Risk  (08/30/2022)  Financial Resource Strain: Low Risk  (08/30/2022)  Physical Activity: Sufficiently Active (08/30/2022)  Social Connections: Moderately Integrated (08/30/2022)  Stress: No Stress Concern Present (08/30/2022)  Tobacco Use: Low Risk  (12/03/2022)   SDOH Interventions:     Readmission Risk Interventions     No data to display

## 2022-12-04 NOTE — Plan of Care (Signed)

## 2022-12-05 ENCOUNTER — Inpatient Hospital Stay (HOSPITAL_COMMUNITY): Payer: Medicare Other

## 2022-12-05 DIAGNOSIS — I509 Heart failure, unspecified: Secondary | ICD-10-CM | POA: Diagnosis not present

## 2022-12-05 DIAGNOSIS — J9601 Acute respiratory failure with hypoxia: Secondary | ICD-10-CM | POA: Diagnosis not present

## 2022-12-05 DIAGNOSIS — D649 Anemia, unspecified: Secondary | ICD-10-CM | POA: Diagnosis not present

## 2022-12-05 DIAGNOSIS — E871 Hypo-osmolality and hyponatremia: Secondary | ICD-10-CM | POA: Diagnosis not present

## 2022-12-05 LAB — CBC
HCT: 32.4 % — ABNORMAL LOW (ref 39.0–52.0)
Hemoglobin: 10.8 g/dL — ABNORMAL LOW (ref 13.0–17.0)
MCH: 32.1 pg (ref 26.0–34.0)
MCHC: 33.3 g/dL (ref 30.0–36.0)
MCV: 96.4 fL (ref 80.0–100.0)
Platelets: 298 10*3/uL (ref 150–400)
RBC: 3.36 MIL/uL — ABNORMAL LOW (ref 4.22–5.81)
RDW: 12.6 % (ref 11.5–15.5)
WBC: 6 10*3/uL (ref 4.0–10.5)
nRBC: 0 % (ref 0.0–0.2)

## 2022-12-05 LAB — BASIC METABOLIC PANEL
Anion gap: 8 (ref 5–15)
BUN: 23 mg/dL (ref 8–23)
CO2: 24 mmol/L (ref 22–32)
Calcium: 9 mg/dL (ref 8.9–10.3)
Chloride: 98 mmol/L (ref 98–111)
Creatinine, Ser: 0.96 mg/dL (ref 0.61–1.24)
GFR, Estimated: >60 mL/min (ref >60)
Glucose, Bld: 107 mg/dL — ABNORMAL HIGH (ref 70–99)
Potassium: 4.1 mmol/L (ref 3.5–5.1)
Sodium: 130 mmol/L — ABNORMAL LOW (ref 135–145)

## 2022-12-05 LAB — HEMOGLOBIN A1C
Hgb A1c MFr Bld: 5.7 % — ABNORMAL HIGH (ref 4.8–5.6)
Mean Plasma Glucose: 117 mg/dL

## 2022-12-05 LAB — LIPOPROTEIN A (LPA): Lipoprotein (a): 205.6 nmol/L — ABNORMAL HIGH (ref <75.0)

## 2022-12-05 LAB — HEPARIN LEVEL (UNFRACTIONATED): Heparin Unfractionated: 0.38 [IU]/mL (ref 0.30–0.70)

## 2022-12-05 LAB — MAGNESIUM: Magnesium: 2.1 mg/dL (ref 1.7–2.4)

## 2022-12-05 MED ORDER — BENAZEPRIL HCL 5 MG PO TABS
5.0000 mg | ORAL_TABLET | Freq: Every day | ORAL | Status: DC
  Administered 2022-12-06: 5 mg via ORAL
  Filled 2022-12-05: qty 1

## 2022-12-05 NOTE — Progress Notes (Signed)
Patient has an aide at home.  Her name is Midge Aver, [phone]. Cordelia Pen said she wants to be called when the patient is discharged so that she can meet him at his house.  She said she has been his aide for two years.

## 2022-12-05 NOTE — Consult Note (Signed)
Ref: [name], MD   Subjective:  Wants to go home but respiratory distress continue. Chest x-ray positive for pulmonary edema. Hyponatremia continues.  Objective:  Vital Signs in the last 24 hours: Temp:  [98 F (36.7 C)-98.7 F (37.1 C)] 98 F (36.7 C) (09/12 1121) Pulse Rate:  [84-87] 86 (09/12 1121) Cardiac Rhythm: Atrial flutter (09/12 0700) Resp:  [16-22] 19 (09/12 1121) BP: (89-120)/(53-65) 89/53 (09/12 1121) SpO2:  [90 %-97 %] 92 % (09/12 1213) Weight:  [89.9 kg] 89.9 kg (09/12 0416)  Physical Exam: BP Readings from Last 1 Encounters:  12/05/22 (!) 89/53     Wt Readings from Last 1 Encounters:  12/05/22 89.9 kg    Weight change: -0.819 kg Body mass index is 26.15 kg/m. HEENT: [place]/AT, Eyes-Blue, Conjunctiva-Pink, Sclera-Non-icteric Neck: No JVD, No bruit, Trachea midline. Lungs:  Clearing, Bilateral. Cardiac:  Irregular rhythm, normal S1 and S2, no S3. II/VI systolic murmur. Abdomen:  Soft, non-tender. BS present. Extremities:  1 + edema present. No cyanosis. No clubbing. CNS: AxOx3, Cranial nerves grossly intact, moves all 4 extremities.  Skin: Warm and dry.   Intake/Output from previous day: 09/11 0701 - 09/12 0700 In: 578.9 [P.O.:240; I.V.:338.9] Out: 950 [Urine:950]    Lab Results: BMET    Component Value Date/Time   NA 130 (L) 12/05/2022 0309   NA 134 (L) 12/04/2022 0331   NA 130 (L) 12/03/2022 1358   K 4.1 12/05/2022 0309   K 4.5 12/04/2022 0331   K 4.7 12/03/2022 1358   CL 98 12/05/2022 0309   CL 97 (L) 12/04/2022 0331   CL 96 (L) 12/03/2022 0956   CO2 24 12/05/2022 0309   CO2 25 12/04/2022 0331   CO2 20 (L) 12/03/2022 0956   GLUCOSE 107 (H) 12/05/2022 0309   GLUCOSE 119 (H) 12/04/2022 0331   GLUCOSE 175 (H) 12/03/2022 0956   BUN 23 12/05/2022 0309   BUN 18 12/04/2022 0331   BUN 20 12/03/2022 0956   CREATININE 0.96 12/05/2022 0309   CREATININE 0.92 12/04/2022 0331   CREATININE 0.93 12/03/2022 0956   CREATININE 0.73  02/25/2020 0900   CALCIUM 9.0 12/05/2022 0309   CALCIUM 9.5 12/04/2022 0331   CALCIUM 9.2 12/03/2022 0956   GFRNONAA >60 12/05/2022 0309   GFRNONAA >60 12/04/2022 0331   GFRNONAA >60 12/03/2022 0956   GFRAA >60 03/16/2017 0523   GFRAA >60 03/15/2017 0514   GFRAA >60 03/14/2017 0426   CBC    Component Value Date/Time   WBC 6.0 12/05/2022 0309   RBC 3.36 (L) 12/05/2022 0309   HGB 10.8 (L) 12/05/2022 0309   HCT 32.4 (L) 12/05/2022 0309   PLT 298 12/05/2022 0309   MCV 96.4 12/05/2022 0309   MCH 32.1 12/05/2022 0309   MCHC 33.3 12/05/2022 0309   RDW 12.6 12/05/2022 0309   LYMPHSABS 0.7 12/04/2022 0331   MONOABS 1.1 (H) 12/04/2022 0331   EOSABS 0.0 12/04/2022 0331   BASOSABS 0.0 12/04/2022 0331   HEPATIC Function Panel Recent Labs    10/22/22 0907 12/03/22 0956 12/04/22 0331  PROT 7.2 5.9* 6.4*  ALBUMIN 4.1 2.8* 2.8*  AST 17 21 42*  ALT 15 18 19   ALKPHOS 89 74 70   HEMOGLOBIN A1C Lab Results  Component Value Date   MPG 117 12/04/2022   CARDIAC ENZYMES Lab Results  Component Value Date   TROPONINI <0.30 03/30/2012   TROPONINI <0.30 03/30/2012   TROPONINI <0.30 03/29/2012   BNP No results for input(s): "PROBNP" in the last  8760 hours. TSH Recent Labs    12/04/22 0331  TSH 4.762*   CHOLESTEROL Recent Labs    12/04/22 0331  CHOL 208*    Scheduled Meds:  amiodarone  200 mg Oral BID   [START ON 12/06/2022] benazepril  5 mg Oral Daily   ezetimibe  10 mg Oral Daily   famotidine  20 mg Oral QHS   finasteride  5 mg Oral Daily   furosemide  40 mg Intravenous Daily   gabapentin  200 mg Oral BID   magnesium oxide  400 mg Oral Daily   metoprolol succinate  25 mg Oral Daily   potassium chloride  10 mEq Oral Daily   sodium chloride flush  3 mL Intravenous Q12H   Continuous Infusions:  sodium chloride     heparin 1,450 Units/hr (12/05/22 1115)   PRN Meds:.sodium chloride, acetaminophen **OR** acetaminophen, polyethylene glycol, sodium chloride  flush  Assessment/Plan: Acute ASW MI Acute systolic left heart failure HTN HLD Hyperglycemia Hyponatremia  Plan: Continue diuresis. Decrease BP lowering medications for now.   LOS: 2 days   Time spent including chart review, lab review, examination, discussion with patient/Nurse/Doctor : 30 min  Orpah Cobb  MD  12/05/2022, 1:00 PM

## 2022-12-05 NOTE — TOC Progression Note (Signed)
Transition of Care [facility]) - Progression Note    Patient Details  Name: Darin [name] MRN: [id number] Date of Birth: 09/21/1931  [facility]) CM/SW Contact  [name], RN Phone Number: 12/05/2022, 3:10 PM  Clinical Narrative:    Per MD Pokhrel, asked this NCM to get Hospice to speak with patient.  NCM made referral to [facility], Ines Bloomer will be speaking with patient in the am.   Expected Discharge Plan: Home w Home Health Services Barriers to Discharge: Continued Medical Work up  Expected Discharge Plan and Services In-house Referral: NA Discharge Planning Services: CM Consult Post Acute Care Choice: NA Living arrangements for the past 2 months: Single Family Home                   DME Agency: NA       HH Arranged: NA           Social Determinants of Health (SDOH) Interventions SDOH Screenings   Food Insecurity: No Food Insecurity (12/03/2022)  Housing: Low Risk  (12/03/2022)  Transportation Needs: No Transportation Needs (12/03/2022)  Utilities: Not At Risk (12/03/2022)  Alcohol Screen: Low Risk  (08/30/2022)  Depression (PHQ2-9): Low Risk  (08/30/2022)  Financial Resource Strain: Low Risk  (08/30/2022)  Physical Activity: Sufficiently Active (08/30/2022)  Social Connections: Moderately Integrated (08/30/2022)  Stress: No Stress Concern Present (08/30/2022)  Tobacco Use: Low Risk  (12/03/2022)    Readmission Risk Interventions     No data to display

## 2022-12-05 NOTE — Plan of Care (Signed)
  Problem: Clinical Measurements: Goal: Will remain free from infection Outcome: Progressing Goal: Respiratory complications will improve Outcome: Progressing   Problem: Nutrition: Goal: Adequate nutrition will be maintained Outcome: Progressing   

## 2022-12-05 NOTE — Progress Notes (Signed)
Mobility Specialist Progress Note:    12/05/22 1542  Mobility  Activity Ambulated with assistance in hallway  Level of Assistance Contact guard assist, steadying assist  Assistive Device Front wheel walker  Distance Ambulated (ft) 70 ft  Activity Response Tolerated well  Mobility Referral Yes  $Mobility charge 1 Mobility  Mobility Specialist Start Time (ACUTE ONLY) 1500  Mobility Specialist Stop Time (ACUTE ONLY) 1514  Mobility Specialist Time Calculation (min) (ACUTE ONLY) 14 min   Pt received in bed, agreeable to ambulate. Pt needed no physical assistance during session just a contact guard d/t ataxic gait.  C/o of minor fatigue towards the end otherwise no c/o. Returned to room w/o fault. Pt requested to sit on the EOB. Call bell in reach. All needs met and chair alarm on.   Thompson Grayer Mobility Specialist  Please contact vis Secure Chat or  Rehab Office [phone]

## 2022-12-05 NOTE — Progress Notes (Signed)
ANTICOAGULATION CONSULT NOTE- Follow Up  Pharmacy Consult for heparin Indication: chest pain/ACS  Allergies  Allergen Reactions   Statins Other (See Comments)    Muscle pain and upset stomach  Other Reaction(s): leg pain, stoamch    Patient Measurements: Height: 6\' 1"  (185.4 cm) Weight: 89.9 kg (198 lb 3.1 oz) IBW/kg (Calculated) : 79.9 Heparin Dosing Weight: 90kg  Vital Signs: Temp: 98.7 F (37.1 C) (09/12 0752) Temp Source: Oral (09/12 0752) BP: 104/58 (09/12 0752) Pulse Rate: 85 (09/12 0752)  Labs: Recent Labs    12/03/22 0956 12/03/22 1241 12/03/22 1358 12/03/22 1358 12/03/22 1441 12/04/22 0331 12/04/22 1150 12/04/22 2255 12/05/22 0309 12/05/22 0850  HGB 10.9*  --  11.2*  --   --  11.0*  --   --  10.8*  --   HCT 34.1*  --  33.0*  --   --  33.6*  --   --  32.4*  --   PLT 297  --   --   --   --  322  --   --  298  --   HEPARINUNFRC  --   --   --    < >  --  0.33 0.18* 0.47  --  0.38  CREATININE 0.93  --   --   --   --  0.92  --   --  0.96  --   TROPONINIHS  --  1,453*  --   --  2,250*  --   --   --   --   --    < > = values in this interval not displayed.    Estimated Creatinine Clearance: 59 mL/min (by C-G formula based on SCr of 0.96 mg/dL).   Medical History: Past Medical History:  Diagnosis Date   Arthritis    DUPUYTREN'S CONTRACTURE, RIGHT 07/04/2009   Annotation: 4th digit Qualifier: Diagnosis of  By: Lovell Sheehan MD, Balinda Quails    GERD (gastroesophageal reflux disease)    H/O hiatal hernia    "FOR YEARS" " NO PROBLEM"   Hyperlipidemia    Hypertension    Knee pain    Neuropathy    PERIPHERAL    Osteoporosis    PMR (polymyalgia rheumatica) (HCC)    Polymyalgia (HCC) 5 YEARS   Assessment: 58 YOM presenting with SOB, elevated troponin, he is not on anticoagulation PTA, chronic anemia stable, plts wnl.  Confirmatory heparin level is therapeutic at 0.38 on infusion at 1450 units/hr. CBC stable with Hgb 10.8, plt wnl, no bleeding noted on chart  review  Goal of Therapy:  Heparin level 0.3-0.7 units/ml Monitor platelets by anticoagulation protocol: Yes   Plan:  Continue heparin infusion to 1450 units/hr Continue to monitor daily heparin level, H&H and platelets  Thank you for allowing pharmacy to be a part of this patient's care.   Rennis Petty, PharmD 12/05/2022 10:39 AM

## 2022-12-05 NOTE — Progress Notes (Signed)
Heart Failure Navigator Progress Note  Assessed for Heart & Vascular TOC clinic readiness.  Patient does not meet criteria due to patient declined TOC.    Navigator will sign off at this time.    Jerral Mccauley,RN, BSN,MSN Heart Failure Nurse Navigator. Contact by secure chat only.

## 2022-12-05 NOTE — Progress Notes (Addendum)
PROGRESS NOTE    Darin [name]  ZOX:[id number] DOB: 19-Sep-1931 DOA: 12/03/2022 PCP: Darin Covey, MD    Brief Narrative:   Darin [name] is a 89 y.o. male with medical history significant of GERD, HTN, hyperlipidemia, neuropathy, PMR presented to hospital with shortness of breath for 1 day with dry cough and dyspnea on exertion even on minimal ambulation.  In the ED patient was noted to be hypoxic with pulse ox of 78% on room air and was put on CPAP.  In the ED, patient was tachycardic.  COVID flu and RSV was negative.  Labs showed hyponatremia with sodium of 130.  Chest x-ray showed new symmetric infiltrates in both lungs with small pleural effusions.  Patient was given 40 mg of IV Lasix placed on CPAP and was admitted hospital for further evaluation and treatment.    Assessment and Plan:  * Acute respiratory failure with hypoxia likely secondary to congestive heart failure exacerbation with anteroseptal wall MI. Chest x-ray showed pulmonary edema with increased BNP patient had increased leg swelling and dyspnea suggestive of congestive heart failure exacerbation.  No chest pain.  CT scan of the chest showed groundglass opacities with septal thickening suggestive of pulmonary edema with bilateral pleural effusion.  Ultrasound of the lower extremity was negative for DVT.  Will continue to wean oxygen as able.  On room air.  Continue IV lasix daily, closely monitor.  Elevated troponin/ Anterior septal wall NSTEMI Initial troponin >1000, denies any chest pain.  EKG without any acute changes.  Cardiology on board.  Patient is allergic to statin.  On heparin drip.  Patient has declined cardiac catheterization.  Lipitor was added.  2D echocardiogram with reduced LV function at 20 to 25% with severely decreased function and regional wall motion abnormalities with grade 1 diastolic dysfunction.  Moderately elevated pulmonary artery systolic pressure.   Acute systolic CHF exacerbation Review of  previous 2D echocardiogram from 2023 with a normal LV ejection fraction.  Continue intake and output charting Daily weights.   Continue 40 mg Lasix daily as tolerated.  2D echocardiogram.  Patient does not want aggressive intervention.  At this time will consult palliative care for defining goals of care and possible palliative care involvement on discharge.  Patient is DNR.    Solitary pulmonary nodule in the right upper lobe Noted on the CT scan.  5 mm in size.  Recommend follow-up as outpatient.   Normocytic anemia Ferritin level of 56.  Iron level low at 26.  Vitamin B12 level elevated.  Will continue to monitor.   Hyponatremia Latest sodium of 130.  On IV Lasix.  Will need to closely monitor.  Sodium level was around 129 a month back and normal 1 year back.   Essential hypertension On Lotensin at home.  Continue to hold Norvasc and Lotensin for now due to low blood pressure.   GERD (gastroesophageal reflux disease) Continue Pepcid   BPH (benign prostatic hyperplasia) Continue proscar and flomax       DVT prophylaxis: Place TED hose Start: 12/03/22 2324, heparin drip.   Code Status:     Code Status: Limited: Do not attempt resuscitation (DNR) -DNR-LIMITED -Do Not Intubate/DNI   Disposition: Home likely in 1 to 2 days, when medically optimized and safe for disposition.  TOC has been consulted.  Will proceed with hospice consultation   Status is: Inpatient Remains inpatient appropriate because: Congestive heart failure, IV diuretics, still symptomatic, IV heparin drip   Family Communication:   I did  speak with the patient's stepdaughter Ms. Darin [name] on the phone and updated her about the clinical condition of the patient but she requested that I speak with Mr. Darin [name], patient's cousin who is  the health power of attorney.  Phone number for cousin updated in the computer.  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  Patient's cousin is  okay with palliative care/ hospice referral for the patient.  Consultants:  Cardiology  Procedures:  None  Antimicrobials:  None  Anti-infectives (From admission, onward)    None       Subjective: Today, patient was seen and examined at bedside.  Patient states that he wants to go home.  He states that he does not want to die in the hospital but still feels short winded but denies any chest pain fever or chills.  Blood pressure has been noted to be on the lower side  Objective: Vitals:   12/05/22 0812 12/05/22 1000 12/05/22 1121 12/05/22 1213  BP:   (!) 89/53   Pulse:   86   Resp:   19   Temp:   98 F (36.7 C)   TempSrc:   Oral   SpO2: 91% 93% 92% 92%  Weight:      Height:        Intake/Output Summary (Last 24 hours) at 12/05/2022 1400 Last data filed at 12/05/2022 1314 Gross per 24 hour  Intake 904.98 ml  Output 1625 ml  Net -720.02 ml   Filed Weights   12/04/22 0028 12/04/22 0441 12/05/22 0416  Weight: 91.1 kg 90.7 kg 89.9 kg    Physical Examination: Body mass index is 26.15 kg/m.   General:  Average built, not in obvious distress, elderly male, on room air. HENT:   No scleral pallor or icterus noted. Oral mucosa is moist.  Chest:  Clear breath sounds bilaterally. CVS: S1 &S2 heard. Systolic murmer noted.   Irregular rhythm. Abdomen: Soft, nontender, nondistended.  Bowel sounds are heard.   Extremities: No cyanosis, clubbing with + edema.   Psych: Alert, awake and oriented, normal mood CNS:  No cranial nerve deficits.  Moves all extremities. Skin: Warm and dry.  No rashes noted.  Data Reviewed:   CBC: Recent Labs  Lab 12/03/22 0956 12/03/22 1358 12/04/22 0331 12/05/22 0309  WBC 10.4  --  7.6 6.0  NEUTROABS  --   --  5.8  --   HGB 10.9* 11.2* 11.0* 10.8*  HCT 34.1* 33.0* 33.6* 32.4*  MCV 96.3  --  93.9 96.4  PLT 297  --  322 298    Basic Metabolic Panel: Recent Labs  Lab 12/03/22 0956 12/03/22 1358 12/04/22 0331 12/05/22 0309  NA  130* 130* 134* 130*  K 4.6 4.7 4.5 4.1  CL 96*  --  97* 98  CO2 20*  --  25 24  GLUCOSE 175*  --  119* 107*  BUN 20  --  18 23  CREATININE 0.93  --  0.92 0.96  CALCIUM 9.2  --  9.5 9.0  MG  --   --  2.1 2.1    Liver Function Tests: Recent Labs  Lab 12/03/22 0956 12/04/22 0331  AST 21 42*  ALT 18 19  ALKPHOS 74 70  BILITOT 0.9 0.9  PROT 5.9* 6.4*  ALBUMIN 2.8* 2.8*     Radiology Studies: ECHOCARDIOGRAM COMPLETE  Result Date: 12/04/2022    ECHOCARDIOGRAM REPORT   Patient Name:  Lorraine Lax Date of Exam: 12/04/2022 Medical Rec #:  [id number]      Height:       73.0 in Accession #:    [id number]     Weight:       200.0 lb Date of Birth:  09/29/1931       BSA:          2.151 m Patient Age:    89 years       BP:           126/41 mmHg Patient Gender: M              HR:           110 bpm. Exam Location:  Inpatient Procedure: 2D Echo, Color Doppler and Cardiac Doppler Indications:     congestive heart failure  History:         Patient has prior history of Echocardiogram examinations, most                  recent 05/21/2021. CHF, CAD, Arrythmias:Atrial Fibrillation;                  Risk Factors:Hypertension and Dyslipidemia.  Sonographer:     Delcie Roch RDCS Referring Phys:  [id number] Orpah Cobb Diagnosing Phys: Orpah Cobb MD IMPRESSIONS  1. Left ventricular ejection fraction, by estimation, is 20 to 25%. The left ventricle has severely decreased function. The left ventricle demonstrates regional wall motion abnormalities (see scoring diagram/findings for description). Left ventricular diastolic parameters are consistent with Grade I diastolic dysfunction (impaired relaxation).  2. Right ventricular systolic function is mildly reduced. The right ventricular size is normal. There is moderately elevated pulmonary artery systolic pressure.  3. Left atrial size was mildly dilated.  4. Right atrial size was moderately dilated.  5. The mitral valve is degenerative. Moderate mitral valve  regurgitation.  6. Tricuspid valve regurgitation is moderate to severe.  7. The aortic valve is tricuspid. There is moderate calcification of the aortic valve. There is mild thickening of the aortic valve. Aortic valve regurgitation is mild. Aortic valve sclerosis/calcification is present, without any evidence of aortic stenosis.  8. The inferior vena cava is dilated in size with <50% respiratory variability, suggesting right atrial pressure of 15 mmHg. Conclusion(s)/Recommendation(s): Findings consistent with ischemic cardiomyopathy. FINDINGS  Left Ventricle: Left ventricular ejection fraction, by estimation, is 20 to 25%. The left ventricle has severely decreased function. The left ventricle demonstrates regional wall motion abnormalities. The left ventricular internal cavity size was normal  in size. There is no left ventricular hypertrophy. Left ventricular diastolic parameters are consistent with Grade I diastolic dysfunction (impaired relaxation). Right Ventricle: The right ventricular size is normal. No increase in right ventricular wall thickness. Right ventricular systolic function is mildly reduced. There is moderately elevated pulmonary artery systolic pressure. The tricuspid regurgitant velocity is 3.37 m/s, and with an assumed right atrial pressure of 5 mmHg, the estimated right ventricular systolic pressure is 50.4 mmHg. Left Atrium: Left atrial size was mildly dilated. Right Atrium: Right atrial size was moderately dilated. Pericardium: There is no evidence of pericardial effusion. Mitral Valve: The mitral valve is degenerative in appearance. Moderate mitral valve regurgitation, with posteriorly-directed jet. Tricuspid Valve: The tricuspid valve is normal in structure. Tricuspid valve regurgitation is moderate to severe. Aortic Valve: The aortic valve is tricuspid. There is moderate calcification of the aortic valve. There is mild thickening of the aortic valve. There is mild aortic valve annular  calcification. Aortic valve  regurgitation is mild. Aortic regurgitation PHT  measures 291 msec. Aortic valve sclerosis/calcification is present, without any evidence of aortic stenosis. Pulmonic Valve: The pulmonic valve was normal in structure. Pulmonic valve regurgitation is trivial. Aorta: The aortic root is normal in size and structure. Venous: The inferior vena cava is dilated in size with less than 50% respiratory variability, suggesting right atrial pressure of 15 mmHg. IAS/Shunts: No atrial level shunt detected by color flow Doppler.  LEFT VENTRICLE PLAX 2D LVIDd:         4.30 cm LVIDs:         4.00 cm LV PW:         1.00 cm LV IVS:        0.80 cm LVOT diam:     1.90 cm LV SV:         46 LV SV Index:   21 LVOT Area:     2.84 cm  RIGHT VENTRICLE RV Basal diam:  2.70 cm TAPSE (M-mode): 1.6 cm LEFT ATRIUM             Index        RIGHT ATRIUM           Index LA diam:        3.40 cm 1.58 cm/m   RA Area:     14.70 cm LA Vol (A2C):   39.7 ml 18.45 ml/m  RA Volume:   40.50 ml  18.82 ml/m LA Vol (A4C):   47.5 ml 22.08 ml/m LA Biplane Vol: 45.7 ml 21.24 ml/m  AORTIC VALVE LVOT Vmax:   94.67 cm/s LVOT Vmean:  63.600 cm/s LVOT VTI:    0.161 m AI PHT:      291 msec  AORTA Ao Root diam: 3.10 cm TRICUSPID VALVE TR Peak grad:   45.4 mmHg TR Vmax:        337.00 cm/s  SHUNTS Systemic VTI:  0.16 m Systemic Diam: 1.90 cm Orpah Cobb MD Electronically signed by Orpah Cobb MD Signature Date/Time: 12/04/2022/12:20:49 PM    Final    CT Chest High Resolution  Result Date: 12/03/2022 CLINICAL DATA:  Shortness of breath EXAM: CT CHEST WITHOUT CONTRAST TECHNIQUE: Multidetector CT imaging of the chest was performed following the standard protocol without intravenous contrast. High resolution imaging of the lungs, as well as inspiratory and expiratory imaging, was performed. RADIATION DOSE REDUCTION: This exam was performed according to the departmental dose-optimization program which includes automated exposure control,  adjustment of the mA and/or kV according to patient size and/or use of iterative reconstruction technique. COMPARISON:  None Available. FINDINGS: Cardiovascular: Normal heart size. No pericardial effusion. Normal caliber thoracic aorta with moderate calcified plaque. Severe left main and three-vessel coronary artery calcifications. Mediastinum/Nodes: Small hiatal hernia. Thyroid is unremarkable. No enlarged lymph nodes seen in the chest. Lungs/Pleura: Moderate bronchial wall thickening. Patchy central predominant ground-glass opacities and septal thickening. Moderate bilateral pleural effusions and atelectasis. Solid pulmonary nodule of the right upper lobe measuring 5 mm on series 6, image 47. Upper Abdomen: Partially visualized simple appearing cyst of the left kidney, no specific follow-up imaging is necessary. No acute abnormality. Musculoskeletal: No chest wall mass or suspicious bone lesions identified. IMPRESSION: 1. Patchy central predominant ground-glass opacities and septal thickening, findings are consistent with pulmonary edema. 2. Moderate bilateral pleural effusions and atelectasis. 3. Solid pulmonary nodule of the right upper lobe measuring 5 mm. No follow-up needed if patient is low-risk.This recommendation follows the consensus statement: Guidelines for Management of Incidental Pulmonary  Nodules Detected on CT Images: From the Fleischner Society 2017; Radiology 2017; [id number]. 4. Coronary artery calcifications and aortic Atherosclerosis (ICD10-I70.0). Electronically Signed   By: Allegra Lai M.D.   On: 12/03/2022 16:17      LOS: 2 days    Joycelyn Das, MD [facility] Available via Epic secure chat 7am-7pm After these hours, please refer to coverage provider listed on [email or web address] 12/05/2022, 2:00 PM

## 2022-12-06 DIAGNOSIS — E871 Hypo-osmolality and hyponatremia: Secondary | ICD-10-CM | POA: Diagnosis not present

## 2022-12-06 DIAGNOSIS — I5043 Acute on chronic combined systolic (congestive) and diastolic (congestive) heart failure: Secondary | ICD-10-CM

## 2022-12-06 DIAGNOSIS — R7989 Other specified abnormal findings of blood chemistry: Secondary | ICD-10-CM

## 2022-12-06 DIAGNOSIS — J9601 Acute respiratory failure with hypoxia: Secondary | ICD-10-CM | POA: Diagnosis not present

## 2022-12-06 DIAGNOSIS — N400 Enlarged prostate without lower urinary tract symptoms: Secondary | ICD-10-CM | POA: Diagnosis not present

## 2022-12-06 DIAGNOSIS — K219 Gastro-esophageal reflux disease without esophagitis: Secondary | ICD-10-CM | POA: Diagnosis not present

## 2022-12-06 LAB — CBC
HCT: 30.8 % — ABNORMAL LOW (ref 39.0–52.0)
Hemoglobin: 9.9 g/dL — ABNORMAL LOW (ref 13.0–17.0)
MCH: 30.7 pg (ref 26.0–34.0)
MCHC: 32.1 g/dL (ref 30.0–36.0)
MCV: 95.7 fL (ref 80.0–100.0)
Platelets: 319 10*3/uL (ref 150–400)
RBC: 3.22 MIL/uL — ABNORMAL LOW (ref 4.22–5.81)
RDW: 12.7 % (ref 11.5–15.5)
WBC: 5.9 10*3/uL (ref 4.0–10.5)
nRBC: 0 % (ref 0.0–0.2)

## 2022-12-06 LAB — BASIC METABOLIC PANEL
Anion gap: 9 (ref 5–15)
BUN: 23 mg/dL (ref 8–23)
CO2: 28 mmol/L (ref 22–32)
Calcium: 9.3 mg/dL (ref 8.9–10.3)
Chloride: 97 mmol/L — ABNORMAL LOW (ref 98–111)
Creatinine, Ser: 0.97 mg/dL (ref 0.61–1.24)
GFR, Estimated: >60 mL/min (ref >60)
Glucose, Bld: 108 mg/dL — ABNORMAL HIGH (ref 70–99)
Potassium: 4.3 mmol/L (ref 3.5–5.1)
Sodium: 134 mmol/L — ABNORMAL LOW (ref 135–145)

## 2022-12-06 LAB — HEPARIN LEVEL (UNFRACTIONATED): Heparin Unfractionated: 0.49 [IU]/mL (ref 0.30–0.70)

## 2022-12-06 LAB — MAGNESIUM: Magnesium: 2.1 mg/dL (ref 1.7–2.4)

## 2022-12-06 MED ORDER — BENAZEPRIL HCL 5 MG PO TABS: 5.0000 mg | ORAL_TABLET | Freq: Every day | ORAL | 1 refills | Status: DC

## 2022-12-06 MED ORDER — CLOPIDOGREL BISULFATE 75 MG PO TABS
75.0000 mg | ORAL_TABLET | Freq: Every day | ORAL | Status: DC
  Administered 2022-12-06: 75 mg via ORAL
  Filled 2022-12-06: qty 1

## 2022-12-06 MED ORDER — TORSEMIDE 20 MG PO TABS: 20.0000 mg | ORAL_TABLET | Freq: Every day | ORAL | 0 refills | Status: DC

## 2022-12-06 MED ORDER — METOPROLOL SUCCINATE ER 25 MG PO TB24: 25.0000 mg | ORAL_TABLET | Freq: Every day | ORAL | 1 refills | Status: DC

## 2022-12-06 MED ORDER — EZETIMIBE 10 MG PO TABS: 10.0000 mg | ORAL_TABLET | Freq: Every day | ORAL | 0 refills | Status: DC

## 2022-12-06 MED ORDER — CLOPIDOGREL BISULFATE 75 MG PO TABS: 75.0000 mg | ORAL_TABLET | Freq: Every day | ORAL | 1 refills | Status: DC

## 2022-12-06 MED ORDER — AMIODARONE HCL 200 MG PO TABS: ORAL_TABLET | ORAL | 0 refills | Status: DC

## 2022-12-06 MED ORDER — AMIODARONE HCL 200 MG PO TABS: 200.0000 mg | ORAL_TABLET | Freq: Every day | ORAL | Status: DC

## 2022-12-06 MED ORDER — AMIODARONE HCL 200 MG PO TABS: 200.0000 mg | ORAL_TABLET | Freq: Two times a day (BID) | ORAL | Status: DC

## 2022-12-06 NOTE — Consult Note (Signed)
Ref: [name], MD   Subjective:  Awake. Slow and steady diuresis. Breathing improving.  Objective:  Vital Signs in the last 24 hours: Temp:  [97.9 F (36.6 C)-98.6 F (37 C)] 98.6 F (37 C) (09/13 0824) Pulse Rate:  [82-86] 85 (09/13 0824) Cardiac Rhythm: Atrial flutter (09/13 0700) Resp:  [18-20] 18 (09/13 0824) BP: (89-122)/(53-65) 122/65 (09/13 0824) SpO2:  [91 %-99 %] 91 % (09/13 0824) Weight:  [89.6 kg] 89.6 kg (09/13 0523)  Physical Exam: BP Readings from Last 1 Encounters:  12/06/22 122/65     Wt Readings from Last 1 Encounters:  12/06/22 89.6 kg    Weight change: -0.3 kg Body mass index is 26.06 kg/m. HEENT: [place]/AT, Eyes-Blue,  Conjunctiva-Pink, Sclera-Non-icteric Neck: No JVD, No bruit, Trachea midline. Lungs:  Clearing, Bilateral. Cardiac:  Irregular rhythm, normal S1 and S2, no S3. II/VI systolic murmur. Abdomen:  Soft, non-tender. BS present. Extremities:  Trace edema present. No cyanosis. No clubbing. CNS: AxOx3, Cranial nerves grossly intact, moves all 4 extremities.  Skin: Warm and dry.   Intake/Output from previous day: 09/12 0701 - 09/13 0700 In: 606.6 [P.O.:480; I.V.:126.6] Out: 1475 [Urine:1475]    Lab Results: BMET    Component Value Date/Time   NA 134 (L) 12/06/2022 0340   NA 130 (L) 12/05/2022 0309   NA 134 (L) 12/04/2022 0331   K 4.3 12/06/2022 0340   K 4.1 12/05/2022 0309   K 4.5 12/04/2022 0331   CL 97 (L) 12/06/2022 0340   CL 98 12/05/2022 0309   CL 97 (L) 12/04/2022 0331   CO2 28 12/06/2022 0340   CO2 24 12/05/2022 0309   CO2 25 12/04/2022 0331   GLUCOSE 108 (H) 12/06/2022 0340   GLUCOSE 107 (H) 12/05/2022 0309   GLUCOSE 119 (H) 12/04/2022 0331   BUN 23 12/06/2022 0340   BUN 23 12/05/2022 0309   BUN 18 12/04/2022 0331   CREATININE 0.97 12/06/2022 0340   CREATININE 0.96 12/05/2022 0309   CREATININE 0.92 12/04/2022 0331   CREATININE 0.73 02/25/2020 0900   CALCIUM 9.3 12/06/2022 0340   CALCIUM 9.0 12/05/2022  0309   CALCIUM 9.5 12/04/2022 0331   GFRNONAA >60 12/06/2022 0340   GFRNONAA >60 12/05/2022 0309   GFRNONAA >60 12/04/2022 0331   GFRAA >60 03/16/2017 0523   GFRAA >60 03/15/2017 0514   GFRAA >60 03/14/2017 0426   CBC    Component Value Date/Time   WBC 5.9 12/06/2022 0340   RBC 3.22 (L) 12/06/2022 0340   HGB 9.9 (L) 12/06/2022 0340   HCT 30.8 (L) 12/06/2022 0340   PLT 319 12/06/2022 0340   MCV 95.7 12/06/2022 0340   MCH 30.7 12/06/2022 0340   MCHC 32.1 12/06/2022 0340   RDW 12.7 12/06/2022 0340   LYMPHSABS 0.7 12/04/2022 0331   MONOABS 1.1 (H) 12/04/2022 0331   EOSABS 0.0 12/04/2022 0331   BASOSABS 0.0 12/04/2022 0331   HEPATIC Function Panel Recent Labs    10/22/22 0907 12/03/22 0956 12/04/22 0331  PROT 7.2 5.9* 6.4*  ALBUMIN 4.1 2.8* 2.8*  AST 17 21 42*  ALT 15 18 19   ALKPHOS 89 74 70   HEMOGLOBIN A1C Lab Results  Component Value Date   MPG 117 12/04/2022   CARDIAC ENZYMES Lab Results  Component Value Date   TROPONINI <0.30 03/30/2012   TROPONINI <0.30 03/30/2012   TROPONINI <0.30 03/29/2012   BNP No results for input(s): "PROBNP" in the last 8760 hours. TSH Recent Labs    12/04/22 0331  TSH 4.762*   CHOLESTEROL Recent Labs    12/04/22 0331  CHOL 208*    Scheduled Meds:  amiodarone  200 mg Oral BID   Followed by   Melene Muller ON 12/11/2022] amiodarone  200 mg Oral Daily   benazepril  5 mg Oral Daily   clopidogrel  75 mg Oral Daily   ezetimibe  10 mg Oral Daily   famotidine  20 mg Oral QHS   finasteride  5 mg Oral Daily   furosemide  40 mg Intravenous Daily   gabapentin  200 mg Oral BID   magnesium oxide  400 mg Oral Daily   metoprolol succinate  25 mg Oral Daily   potassium chloride  10 mEq Oral Daily   sodium chloride flush  3 mL Intravenous Q12H   Continuous Infusions:  sodium chloride     PRN Meds:.sodium chloride, acetaminophen **OR** acetaminophen, polyethylene glycol, sodium chloride flush  Assessment/Plan: Acute ASW MI Acute  systolic left heart failure HTN HLD Hyperglycemia Hyponatremia, improving  Plan: Continue diuresis. Change to torsemide 20 mg. PO daily on discharge. Add Clopidogrel 75 mg. daily. F/U 1 week.   LOS: 3 days   Time spent including chart review, lab review, examination, discussion with patient/Nurse/Pharmacy : 30 min   Orpah Cobb  MD  12/06/2022, 9:49 AM

## 2022-12-06 NOTE — TOC Transition Note (Addendum)
Transition of Care [facility]) - CM/SW Discharge Note   Patient Details  Name: Darin [name] MRN: [id number] Date of Birth: 09/12/1931  [facility] CM/SW Contact:  [name], RN Phone Number: 12/06/2022, 11:59 AM   Clinical Narrative:    For dc today, home with hospice with [facility] via ambulance. PTAR has been scheduled for 2 pm.  He has [facility] care aide services and his aide will be there at 2 pm today to receive him.  She comes in Mon-Friday 8 to 12 and 5 to 8 pm.  He is also getting scheduled times for Saturday and Sunday with [facility] who is at the bedside getting his times for the weekend. Ralp the patient's POA is aware of the above information he called [facility] to get more hours for patient.  Per [facility] the aide will be there on Sautrdays from 2 to 6:30 and on Sunday's from 9 am to 12.    Final next level of care: Home w Hospice Care Barriers to Discharge: No Barriers Identified   Patient Goals and CMS Choice CMS Medicare.gov Compare Post Acute Care list provided to:: Patient Choice offered to / list presented to : Patient  Discharge Placement                         Discharge Plan and Services Additional resources added to the After Visit Summary for   In-house Referral: NA Discharge Planning Services: CM Consult Post Acute Care Choice: NA          DME Arranged: N/A DME Agency: NA       HH Arranged: RN HH Agency:  [facility]) Date HH Agency Contacted: 12/05/22 Time HH Agency Contacted: 1100 Representative spoke with at [facility] Agency: Shawn  Social Determinants of Health (SDOH) Interventions SDOH Screenings   Food Insecurity: No Food Insecurity (12/03/2022)  Housing: Low Risk  (12/03/2022)  Transportation Needs: No Transportation Needs (12/03/2022)  Utilities: Not At Risk (12/03/2022)  Alcohol Screen: Low Risk  (08/30/2022)  Depression (PHQ2-9): Low Risk  (08/30/2022)  Financial Resource Strain: Low Risk  (08/30/2022)  Physical Activity:  Sufficiently Active (08/30/2022)  Social Connections: Moderately Integrated (08/30/2022)  Stress: No Stress Concern Present (08/30/2022)  Tobacco Use: Low Risk  (12/03/2022)     Readmission Risk Interventions     No data to display

## 2022-12-06 NOTE — Progress Notes (Signed)
ANTICOAGULATION CONSULT NOTE- Follow Up  Pharmacy Consult for heparin Indication: chest pain/ACS  Allergies  Allergen Reactions   Statins Other (See Comments)    Muscle pain and upset stomach  Other Reaction(s): leg pain, stoamch    Patient Measurements: Height: 6\' 1"  (185.4 cm) Weight: 89.6 kg (197 lb 8.5 oz) IBW/kg (Calculated) : 79.9 Heparin Dosing Weight: 90kg  Vital Signs: Temp: 98.6 F (37 C) (09/13 0824) Temp Source: Oral (09/13 0824) BP: 122/65 (09/13 0824) Pulse Rate: 85 (09/13 0824)  Labs: Recent Labs    12/03/22 1241 12/03/22 1358 12/03/22 1441 12/04/22 0331 12/04/22 1150 12/04/22 2255 12/05/22 0309 12/05/22 0850 12/06/22 0340  HGB  --    < >  --  11.0*  --   --  10.8*  --  9.9*  HCT  --    < >  --  33.6*  --   --  32.4*  --  30.8*  PLT  --   --   --  322  --   --  298  --  319  HEPARINUNFRC  --   --   --  0.33   < > 0.47  --  0.38 0.49  CREATININE  --   --   --  0.92  --   --  0.96  --  0.97  TROPONINIHS 1,453*  --  2,250*  --   --   --   --   --   --    < > = values in this interval not displayed.    Estimated Creatinine Clearance: 58.3 mL/min (by C-G formula based on SCr of 0.97 mg/dL).   Medical History: Past Medical History:  Diagnosis Date   Arthritis    DUPUYTREN'S CONTRACTURE, RIGHT 07/04/2009   Annotation: 4th digit Qualifier: Diagnosis of  By: Lovell Sheehan MD, Balinda Quails    GERD (gastroesophageal reflux disease)    H/O hiatal hernia    "FOR YEARS" " NO PROBLEM"   Hyperlipidemia    Hypertension    Knee pain    Neuropathy    PERIPHERAL    Osteoporosis    PMR (polymyalgia rheumatica) (HCC)    Polymyalgia (HCC) 5 YEARS   Assessment: 17 YOM presenting with SOB, elevated troponin, he is not on anticoagulation PTA, chronic anemia stable, plts wnl.  Heparin level remains therapeutic at 0.49 on infusion at 1450 units/hr. CBC stable with Hgb 9.9, plt wnl, no bleeding noted on chart review. Today, patient has completed 48h of heparin treatment  for ACS.   Goal of Therapy:  Heparin level 0.3-0.7 units/ml Monitor platelets by anticoagulation protocol: Yes   Plan:  Discontinue heparin for ACS Pharmacy will sign off and continue to monitor peripherally  Thank you for allowing pharmacy to be a part of this patient's care.   Rennis Petty, PharmD 12/06/2022 8:39 AM

## 2022-12-06 NOTE — Plan of Care (Signed)
Problem: Education: Goal: Ability to demonstrate management of disease process will improve 12/06/2022 1231 by Mathis Fare, RN Outcome: Adequate for Discharge 12/06/2022 1231 by Mathis Fare, RN Outcome: Progressing 12/06/2022 1231 by Mathis Fare, RN Outcome: Progressing Goal: Ability to verbalize understanding of medication therapies will improve 12/06/2022 1231 by Mathis Fare, RN Outcome: Adequate for Discharge 12/06/2022 1231 by Mathis Fare, RN Outcome: Progressing 12/06/2022 1231 by Mathis Fare, RN Outcome: Progressing Goal: Individualized Educational Video(s) 12/06/2022 1231 by Mathis Fare, RN Outcome: Adequate for Discharge 12/06/2022 1231 by Mathis Fare, RN Outcome: Progressing 12/06/2022 1231 by Mathis Fare, RN Outcome: Progressing   Problem: Activity: Goal: Capacity to carry out activities will improve 12/06/2022 1231 by Mathis Fare, RN Outcome: Adequate for Discharge 12/06/2022 1231 by Mathis Fare, RN Outcome: Progressing 12/06/2022 1231 by Mathis Fare, RN Outcome: Progressing   Problem: Cardiac: Goal: Ability to achieve and maintain adequate cardiopulmonary perfusion will improve 12/06/2022 1231 by Mathis Fare, RN Outcome: Adequate for Discharge 12/06/2022 1231 by Mathis Fare, RN Outcome: Progressing 12/06/2022 1231 by Mathis Fare, RN Outcome: Progressing   Problem: Education: Goal: Knowledge of General Education information will improve Description: Including pain rating scale, medication(s)/side effects and non-pharmacologic comfort measures 12/06/2022 1231 by Mathis Fare, RN Outcome: Adequate for Discharge 12/06/2022 1231 by Mathis Fare, RN Outcome: Progressing 12/06/2022 1231 by Mathis Fare, RN Outcome: Progressing   Problem: Health Behavior/Discharge Planning: Goal: Ability to manage health-related needs will improve 12/06/2022 1231 by Mathis Fare, RN Outcome: Adequate  for Discharge 12/06/2022 1231 by Mathis Fare, RN Outcome: Progressing 12/06/2022 1231 by Mathis Fare, RN Outcome: Progressing   Problem: Clinical Measurements: Goal: Ability to maintain clinical measurements within normal limits will improve 12/06/2022 1231 by Mathis Fare, RN Outcome: Adequate for Discharge 12/06/2022 1231 by Mathis Fare, RN Outcome: Progressing 12/06/2022 1231 by Mathis Fare, RN Outcome: Progressing Goal: Will remain free from infection 12/06/2022 1231 by Mathis Fare, RN Outcome: Adequate for Discharge 12/06/2022 1231 by Mathis Fare, RN Outcome: Progressing 12/06/2022 1231 by Mathis Fare, RN Outcome: Progressing Goal: Diagnostic test results will improve 12/06/2022 1231 by Mathis Fare, RN Outcome: Adequate for Discharge 12/06/2022 1231 by Mathis Fare, RN Outcome: Progressing 12/06/2022 1231 by Mathis Fare, RN Outcome: Progressing Goal: Respiratory complications will improve 12/06/2022 1231 by Mathis Fare, RN Outcome: Adequate for Discharge 12/06/2022 1231 by Mathis Fare, RN Outcome: Progressing 12/06/2022 1231 by Mathis Fare, RN Outcome: Progressing Goal: Cardiovascular complication will be avoided 12/06/2022 1231 by Mathis Fare, RN Outcome: Adequate for Discharge 12/06/2022 1231 by Mathis Fare, RN Outcome: Progressing 12/06/2022 1231 by Mathis Fare, RN Outcome: Progressing   Problem: Activity: Goal: Risk for activity intolerance will decrease 12/06/2022 1231 by Mathis Fare, RN Outcome: Adequate for Discharge 12/06/2022 1231 by Mathis Fare, RN Outcome: Progressing 12/06/2022 1231 by Mathis Fare, RN Outcome: Progressing   Problem: Nutrition: Goal: Adequate nutrition will be maintained 12/06/2022 1231 by Mathis Fare, RN Outcome: Adequate for Discharge 12/06/2022 1231 by Mathis Fare, RN Outcome: Progressing 12/06/2022 1231 by Mathis Fare, RN Outcome:  Progressing   Problem: Coping: Goal: Level of anxiety will decrease 12/06/2022 1231 by Mathis Fare, RN Outcome: Adequate for Discharge 12/06/2022 1231 by Mathis Fare, RN Outcome: Progressing 12/06/2022 1231 by Mathis Fare, RN Outcome: Progressing   Problem: Elimination: Goal: Will not experience complications related to bowel motility 12/06/2022 1231 by Mathis Fare, RN Outcome: Adequate for Discharge 12/06/2022 1231 by Mathis Fare, RN Outcome: Progressing 12/06/2022 1231 by Mathis Fare, RN Outcome: Progressing Goal: Will not experience complications related to urinary retention  12/06/2022 1231 by Mathis Fare, RN Outcome: Adequate for Discharge 12/06/2022 1231 by Mathis Fare, RN Outcome: Progressing 12/06/2022 1231 by Mathis Fare, RN Outcome: Progressing   Problem: Pain Managment: Goal: General experience of comfort will improve 12/06/2022 1231 by Mathis Fare, RN Outcome: Adequate for Discharge 12/06/2022 1231 by Mathis Fare, RN Outcome: Progressing 12/06/2022 1231 by Mathis Fare, RN Outcome: Progressing   Problem: Safety: Goal: Ability to remain free from injury will improve 12/06/2022 1231 by Mathis Fare, RN Outcome: Adequate for Discharge 12/06/2022 1231 by Mathis Fare, RN Outcome: Progressing 12/06/2022 1231 by Mathis Fare, RN Outcome: Progressing   Problem: Skin Integrity: Goal: Risk for impaired skin integrity will decrease 12/06/2022 1231 by Mathis Fare, RN Outcome: Adequate for Discharge 12/06/2022 1231 by Mathis Fare, RN Outcome: Progressing 12/06/2022 1231 by Mathis Fare, RN Outcome: Progressing

## 2022-12-06 NOTE — Progress Notes (Signed)
[facility] Liaison Note  Received request from Gavin Pound, Transitions of Care Manager, for hospice services at home after discharge. Spoke with patient to initiate education related to hospice philosophy, services, and team approach to care. Patient verbalized understanding of information given. Per discussion, the plan is for discharge home today.    DME needs discussed. Patient has the a walker and a wheelchair in the home, which are owned. Patient denies any other DME needs at this time. The address has been verified and is correct in the chart.   Please send signed and completed DNR home with patient. Please provide prescriptions at discharge as needed to ensure ongoing symptom management.   [facility] information and contact numbers given to patient. Above information shared with Gavin Pound, Transitions of Care Manager. Please call with any questions or concerns.  Thank you for the opportunity to participate in this patient's care.   Glenna Fellows BSN, Charity fundraiser, OCN [employer] [phone]

## 2022-12-06 NOTE — Progress Notes (Signed)
Palliative:  Consult received. Chart reviewed, heard from team patient has already been seen by hospice and is set to dc home with  hospice to need, no palliative needs.  Gerlean Ren, DNP, AGNP-C Palliative Medicine Team Team Phone # [phone]  Pager # [phone]  NO CHARGE

## 2022-12-06 NOTE — Progress Notes (Signed)
Mobility Specialist Progress Note:    12/06/22 1138  Mobility  Activity Ambulated with assistance in hallway  Level of Assistance Contact guard assist, steadying assist  Assistive Device Front wheel walker  Distance Ambulated (ft) 90 ft  Activity Response Tolerated well  Mobility Referral Yes  $Mobility charge 1 Mobility  Mobility Specialist Start Time (ACUTE ONLY) 1017  Mobility Specialist Stop Time (ACUTE ONLY) 1034  Mobility Specialist Time Calculation (min) (ACUTE ONLY) 17 min   Pt received in bed, agreeable to ambulate. Pt needed no physical assistance during session. C/o mild SOB, SPO2 was 93%, otherwise no c/o. Returned to room w/o fault. All needs met and call bell in reach.  Thompson Grayer Mobility Specialist  Please contact vis Secure Chat or  Rehab Office [phone]

## 2022-12-06 NOTE — Care Management Important Message (Signed)
Important Message  Patient Details  Name: Darin [name] MRN: [id number] Date of Birth: 1931/07/29   Medicare Important Message Given:  Yes     Mardene Sayer 12/06/2022, 3:08 PM

## 2022-12-06 NOTE — Discharge Summary (Signed)
Physician Discharge Summary  Darin [name] ZOX:[id number] DOB: 13-Apr-1931 DOA: 12/03/2022  PCP: [name], MD  Admit date: 12/03/2022 Discharge date: 12/06/2022 Admitted From: Home Disposition: Home with home hospice Recommendations for Outpatient Follow-up:  Defer PCP and cardiology follow-up to hospice.  Home Health: Home hospice and personal aide Equipment/Devices: Per hospice  Discharge Condition: Stable but guarded prognosis CODE STATUS: DNR/DNI  Follow-up Information     Orpah Cobb, MD Follow up in 1 week(s).   Specialty: Cardiology Contact information: [address] [place] [address] [phone]                 Hospital course  89 y.o. male with medical history significant of GERD, HTN, hyperlipidemia, neuropathy, PMR presented to hospital with shortness of breath for 1 day with dry cough and dyspnea on exertion even on minimal ambulation.  In the ED patient was noted to be hypoxic with pulse ox of 78% on room air and was put on CPAP.  In the ED, patient was tachycardic.  COVID flu and RSV was negative.  Labs showed hyponatremia with sodium of 130.  Chest x-ray showed new symmetric infiltrates in both lungs with small pleural effusions.  Patient was given 40 mg of IV Lasix placed on CPAP and was admitted hospital with acute hypoxic respiratory failure due to acute systolic CHF and non-STEMI.  Cardiology consulted.  TTE with LVEF of 20 to 25%, G1 DD, RWMA and moderately elevated PASP.   Patient declined left heart catheterization. He was treated with IV Lasix, IV heparin and GDMT with improvement in symptoms.  He was also started on amiodarone by cardiology.   On the day of discharge, he was liberated of oxygen and cleared for discharge by cardiology.  He is not discharged home with home hospice.  Discharge medications as below.   See individual problem list below for more.   Problems addressed during this hospitalization Principal Problem:    Acute respiratory failure with hypoxia (HCC) Active Problems:   CHF exacerbation (HCC)   Elevated troponin/likely NSTEMI   Normocytic anemia   Hyponatremia   Essential hypertension   GERD (gastroesophageal reflux disease)   BPH (benign prostatic hyperplasia)   Acute respiratory failure with hypoxia due to acute CHF and non-STEMI: Respiratory failure resolved.  TTE as above. -Discharge on p.o. torsemide 20 mg daily per cardiology -GDMT-Toprol-XL and low-dose benazepril   Elevated troponin/ Anterior septal wall NSTEMI: Declined left heart catheterization.  Treated with IV heparin and discharged on Toprol-XL, low-dose benazepril, Plavix and Zetia.  Has a statin allergy.   Acute systolic CHF exacerbation: As above.    Solitary pulmonary nodule in the right upper lobe: Noted on the CT scan.  5 mm in size.  Doubt need for follow-up on this given guarded prognosis due to underlying heart disease.   Normocytic anemia: Relatively stable. Ferritin level of 56.  Iron level low at 26.  Vitamin B12 level elevated.  Will continue to monitor.   Hyponatremia: Improved.  Likely due to CHF.   Essential hypertension -Cardiac meds as below   GERD (gastroesophageal reflux disease) -Continue Pepcid   BPH (benign prostatic hyperplasia) -Continue proscar and flomax            Time spent 35 minutes  Vital signs Vitals:   12/06/22 0523 12/06/22 0815 12/06/22 0824  BP: 111/63 122/65 122/65  Pulse: 85  85  Temp: 97.9 F (36.6 C)  98.6 F (37 C)  Resp: 20 19 18  Physician Discharge Summary  Darin [name] ZOX:[id number] DOB: 13-Apr-1931 DOA: 12/03/2022  PCP: [name], MD  Admit date: 12/03/2022 Discharge date: 12/06/2022 Admitted From: Home Disposition: Home with home hospice Recommendations for Outpatient Follow-up:  Defer PCP and cardiology follow-up to hospice.  Home Health: Home hospice and personal aide Equipment/Devices: Per hospice  Discharge Condition: Stable but guarded prognosis CODE STATUS: DNR/DNI  Follow-up Information     Orpah Cobb, MD Follow up in 1 week(s).   Specialty: Cardiology Contact information: [address] [place] [address] [phone]                 Hospital course  89 y.o. male with medical history significant of GERD, HTN, hyperlipidemia, neuropathy, PMR presented to hospital with shortness of breath for 1 day with dry cough and dyspnea on exertion even on minimal ambulation.  In the ED patient was noted to be hypoxic with pulse ox of 78% on room air and was put on CPAP.  In the ED, patient was tachycardic.  COVID flu and RSV was negative.  Labs showed hyponatremia with sodium of 130.  Chest x-ray showed new symmetric infiltrates in both lungs with small pleural effusions.  Patient was given 40 mg of IV Lasix placed on CPAP and was admitted hospital with acute hypoxic respiratory failure due to acute systolic CHF and non-STEMI.  Cardiology consulted.  TTE with LVEF of 20 to 25%, G1 DD, RWMA and moderately elevated PASP.   Patient declined left heart catheterization. He was treated with IV Lasix, IV heparin and GDMT with improvement in symptoms.  He was also started on amiodarone by cardiology.   On the day of discharge, he was liberated of oxygen and cleared for discharge by cardiology.  He is not discharged home with home hospice.  Discharge medications as below.   See individual problem list below for more.   Problems addressed during this hospitalization Principal Problem:    Acute respiratory failure with hypoxia (HCC) Active Problems:   CHF exacerbation (HCC)   Elevated troponin/likely NSTEMI   Normocytic anemia   Hyponatremia   Essential hypertension   GERD (gastroesophageal reflux disease)   BPH (benign prostatic hyperplasia)   Acute respiratory failure with hypoxia due to acute CHF and non-STEMI: Respiratory failure resolved.  TTE as above. -Discharge on p.o. torsemide 20 mg daily per cardiology -GDMT-Toprol-XL and low-dose benazepril   Elevated troponin/ Anterior septal wall NSTEMI: Declined left heart catheterization.  Treated with IV heparin and discharged on Toprol-XL, low-dose benazepril, Plavix and Zetia.  Has a statin allergy.   Acute systolic CHF exacerbation: As above.    Solitary pulmonary nodule in the right upper lobe: Noted on the CT scan.  5 mm in size.  Doubt need for follow-up on this given guarded prognosis due to underlying heart disease.   Normocytic anemia: Relatively stable. Ferritin level of 56.  Iron level low at 26.  Vitamin B12 level elevated.  Will continue to monitor.   Hyponatremia: Improved.  Likely due to CHF.   Essential hypertension -Cardiac meds as below   GERD (gastroesophageal reflux disease) -Continue Pepcid   BPH (benign prostatic hyperplasia) -Continue proscar and flomax            Time spent 35 minutes  Vital signs Vitals:   12/06/22 0523 12/06/22 0815 12/06/22 0824  BP: 111/63 122/65 122/65  Pulse: 85  85  Temp: 97.9 F (36.6 C)  98.6 F (37 C)  Resp: 20 19 18  There is moderate calcification of the aortic valve. There is mild thickening of the aortic valve. There is mild aortic valve annular calcification. Aortic valve regurgitation is mild. Aortic regurgitation PHT  measures 291 msec. Aortic valve sclerosis/calcification is present, without any evidence of aortic stenosis. Pulmonic Valve: The pulmonic valve was normal in structure. Pulmonic valve regurgitation is trivial. Aorta: The aortic root is normal in size and structure. Venous: The inferior vena cava is dilated in size with less than 50% respiratory variability, suggesting right atrial pressure of 15 mmHg. IAS/Shunts: No atrial level shunt detected by color flow Doppler.  LEFT VENTRICLE PLAX 2D LVIDd:         4.30 cm LVIDs:         4.00 cm LV PW:         1.00 cm LV IVS:        0.80 cm LVOT diam:     1.90 cm LV SV:         46 LV SV Index:   21 LVOT Area:     2.84 cm  RIGHT VENTRICLE RV Basal diam:  2.70 cm TAPSE (M-mode): 1.6 cm LEFT ATRIUM             Index        RIGHT ATRIUM           Index LA diam:        3.40 cm 1.58 cm/m   RA Area:     14.70 cm LA Vol (A2C):   39.7 ml 18.45 ml/m  RA Volume:   40.50 ml  18.82 ml/m LA Vol (A4C):   47.5 ml 22.08 ml/m LA Biplane Vol: 45.7 ml 21.24 ml/m  AORTIC VALVE LVOT Vmax:   94.67 cm/s LVOT Vmean:  63.600 cm/s LVOT VTI:    0.161 m AI PHT:      291 msec  AORTA Ao Root diam: 3.10 cm TRICUSPID VALVE TR Peak grad:   45.4 mmHg TR Vmax:        337.00 cm/s  SHUNTS Systemic VTI:  0.16 m Systemic Diam: 1.90 cm Orpah Cobb MD Electronically signed by Orpah Cobb MD Signature Date/Time: 12/04/2022/12:20:49 PM    Final    VAS [place] LOWER  EXTREMITY VENOUS (DVT) (7a-7p)  Result Date: 12/03/2022  Lower Venous DVT Study Patient Name:  Darin [name]  Date of Exam:   12/03/2022 Medical Rec #: [id number]       Accession #:    [id number] Date of Birth: June 17, 1931        Patient Gender: M Patient Age:   58 years Exam Location:  [facility] Procedure:      VAS [place] LOWER EXTREMITY VENOUS (DVT) Referring Phys: JON KNAPP --------------------------------------------------------------------------------  Indications: Swelling, and SOB.  Comparison Study: No prior left sided studies Performing Technologist: Marilynne Halsted RDMS, RVT  Examination Guidelines: A complete evaluation includes B-mode imaging, spectral Doppler, color Doppler, and power Doppler as needed of all accessible portions of each vessel. Bilateral testing is considered an integral part of a complete examination. Limited examinations for reoccurring indications may be performed as noted. The reflux portion of the exam is performed with the patient in reverse Trendelenburg.  +-----+---------------+---------+-----------+----------+--------------+ RIGHTCompressibilityPhasicitySpontaneityPropertiesThrombus Aging +-----+---------------+---------+-----------+----------+--------------+ CFV  Full           Yes      Yes                                 +-----+---------------+---------+-----------+----------+--------------+  There is moderate calcification of the aortic valve. There is mild thickening of the aortic valve. There is mild aortic valve annular calcification. Aortic valve regurgitation is mild. Aortic regurgitation PHT  measures 291 msec. Aortic valve sclerosis/calcification is present, without any evidence of aortic stenosis. Pulmonic Valve: The pulmonic valve was normal in structure. Pulmonic valve regurgitation is trivial. Aorta: The aortic root is normal in size and structure. Venous: The inferior vena cava is dilated in size with less than 50% respiratory variability, suggesting right atrial pressure of 15 mmHg. IAS/Shunts: No atrial level shunt detected by color flow Doppler.  LEFT VENTRICLE PLAX 2D LVIDd:         4.30 cm LVIDs:         4.00 cm LV PW:         1.00 cm LV IVS:        0.80 cm LVOT diam:     1.90 cm LV SV:         46 LV SV Index:   21 LVOT Area:     2.84 cm  RIGHT VENTRICLE RV Basal diam:  2.70 cm TAPSE (M-mode): 1.6 cm LEFT ATRIUM             Index        RIGHT ATRIUM           Index LA diam:        3.40 cm 1.58 cm/m   RA Area:     14.70 cm LA Vol (A2C):   39.7 ml 18.45 ml/m  RA Volume:   40.50 ml  18.82 ml/m LA Vol (A4C):   47.5 ml 22.08 ml/m LA Biplane Vol: 45.7 ml 21.24 ml/m  AORTIC VALVE LVOT Vmax:   94.67 cm/s LVOT Vmean:  63.600 cm/s LVOT VTI:    0.161 m AI PHT:      291 msec  AORTA Ao Root diam: 3.10 cm TRICUSPID VALVE TR Peak grad:   45.4 mmHg TR Vmax:        337.00 cm/s  SHUNTS Systemic VTI:  0.16 m Systemic Diam: 1.90 cm Orpah Cobb MD Electronically signed by Orpah Cobb MD Signature Date/Time: 12/04/2022/12:20:49 PM    Final    VAS [place] LOWER  EXTREMITY VENOUS (DVT) (7a-7p)  Result Date: 12/03/2022  Lower Venous DVT Study Patient Name:  Darin [name]  Date of Exam:   12/03/2022 Medical Rec #: [id number]       Accession #:    [id number] Date of Birth: June 17, 1931        Patient Gender: M Patient Age:   58 years Exam Location:  [facility] Procedure:      VAS [place] LOWER EXTREMITY VENOUS (DVT) Referring Phys: JON KNAPP --------------------------------------------------------------------------------  Indications: Swelling, and SOB.  Comparison Study: No prior left sided studies Performing Technologist: Marilynne Halsted RDMS, RVT  Examination Guidelines: A complete evaluation includes B-mode imaging, spectral Doppler, color Doppler, and power Doppler as needed of all accessible portions of each vessel. Bilateral testing is considered an integral part of a complete examination. Limited examinations for reoccurring indications may be performed as noted. The reflux portion of the exam is performed with the patient in reverse Trendelenburg.  +-----+---------------+---------+-----------+----------+--------------+ RIGHTCompressibilityPhasicitySpontaneityPropertiesThrombus Aging +-----+---------------+---------+-----------+----------+--------------+ CFV  Full           Yes      Yes                                 +-----+---------------+---------+-----------+----------+--------------+  There is moderate calcification of the aortic valve. There is mild thickening of the aortic valve. There is mild aortic valve annular calcification. Aortic valve regurgitation is mild. Aortic regurgitation PHT  measures 291 msec. Aortic valve sclerosis/calcification is present, without any evidence of aortic stenosis. Pulmonic Valve: The pulmonic valve was normal in structure. Pulmonic valve regurgitation is trivial. Aorta: The aortic root is normal in size and structure. Venous: The inferior vena cava is dilated in size with less than 50% respiratory variability, suggesting right atrial pressure of 15 mmHg. IAS/Shunts: No atrial level shunt detected by color flow Doppler.  LEFT VENTRICLE PLAX 2D LVIDd:         4.30 cm LVIDs:         4.00 cm LV PW:         1.00 cm LV IVS:        0.80 cm LVOT diam:     1.90 cm LV SV:         46 LV SV Index:   21 LVOT Area:     2.84 cm  RIGHT VENTRICLE RV Basal diam:  2.70 cm TAPSE (M-mode): 1.6 cm LEFT ATRIUM             Index        RIGHT ATRIUM           Index LA diam:        3.40 cm 1.58 cm/m   RA Area:     14.70 cm LA Vol (A2C):   39.7 ml 18.45 ml/m  RA Volume:   40.50 ml  18.82 ml/m LA Vol (A4C):   47.5 ml 22.08 ml/m LA Biplane Vol: 45.7 ml 21.24 ml/m  AORTIC VALVE LVOT Vmax:   94.67 cm/s LVOT Vmean:  63.600 cm/s LVOT VTI:    0.161 m AI PHT:      291 msec  AORTA Ao Root diam: 3.10 cm TRICUSPID VALVE TR Peak grad:   45.4 mmHg TR Vmax:        337.00 cm/s  SHUNTS Systemic VTI:  0.16 m Systemic Diam: 1.90 cm Orpah Cobb MD Electronically signed by Orpah Cobb MD Signature Date/Time: 12/04/2022/12:20:49 PM    Final    VAS [place] LOWER  EXTREMITY VENOUS (DVT) (7a-7p)  Result Date: 12/03/2022  Lower Venous DVT Study Patient Name:  Darin [name]  Date of Exam:   12/03/2022 Medical Rec #: [id number]       Accession #:    [id number] Date of Birth: June 17, 1931        Patient Gender: M Patient Age:   58 years Exam Location:  [facility] Procedure:      VAS [place] LOWER EXTREMITY VENOUS (DVT) Referring Phys: JON KNAPP --------------------------------------------------------------------------------  Indications: Swelling, and SOB.  Comparison Study: No prior left sided studies Performing Technologist: Marilynne Halsted RDMS, RVT  Examination Guidelines: A complete evaluation includes B-mode imaging, spectral Doppler, color Doppler, and power Doppler as needed of all accessible portions of each vessel. Bilateral testing is considered an integral part of a complete examination. Limited examinations for reoccurring indications may be performed as noted. The reflux portion of the exam is performed with the patient in reverse Trendelenburg.  +-----+---------------+---------+-----------+----------+--------------+ RIGHTCompressibilityPhasicitySpontaneityPropertiesThrombus Aging +-----+---------------+---------+-----------+----------+--------------+ CFV  Full           Yes      Yes                                 +-----+---------------+---------+-----------+----------+--------------+  Physician Discharge Summary  Darin [name] ZOX:[id number] DOB: 13-Apr-1931 DOA: 12/03/2022  PCP: [name], MD  Admit date: 12/03/2022 Discharge date: 12/06/2022 Admitted From: Home Disposition: Home with home hospice Recommendations for Outpatient Follow-up:  Defer PCP and cardiology follow-up to hospice.  Home Health: Home hospice and personal aide Equipment/Devices: Per hospice  Discharge Condition: Stable but guarded prognosis CODE STATUS: DNR/DNI  Follow-up Information     Orpah Cobb, MD Follow up in 1 week(s).   Specialty: Cardiology Contact information: [address] [place] [address] [phone]                 Hospital course  89 y.o. male with medical history significant of GERD, HTN, hyperlipidemia, neuropathy, PMR presented to hospital with shortness of breath for 1 day with dry cough and dyspnea on exertion even on minimal ambulation.  In the ED patient was noted to be hypoxic with pulse ox of 78% on room air and was put on CPAP.  In the ED, patient was tachycardic.  COVID flu and RSV was negative.  Labs showed hyponatremia with sodium of 130.  Chest x-ray showed new symmetric infiltrates in both lungs with small pleural effusions.  Patient was given 40 mg of IV Lasix placed on CPAP and was admitted hospital with acute hypoxic respiratory failure due to acute systolic CHF and non-STEMI.  Cardiology consulted.  TTE with LVEF of 20 to 25%, G1 DD, RWMA and moderately elevated PASP.   Patient declined left heart catheterization. He was treated with IV Lasix, IV heparin and GDMT with improvement in symptoms.  He was also started on amiodarone by cardiology.   On the day of discharge, he was liberated of oxygen and cleared for discharge by cardiology.  He is not discharged home with home hospice.  Discharge medications as below.   See individual problem list below for more.   Problems addressed during this hospitalization Principal Problem:    Acute respiratory failure with hypoxia (HCC) Active Problems:   CHF exacerbation (HCC)   Elevated troponin/likely NSTEMI   Normocytic anemia   Hyponatremia   Essential hypertension   GERD (gastroesophageal reflux disease)   BPH (benign prostatic hyperplasia)   Acute respiratory failure with hypoxia due to acute CHF and non-STEMI: Respiratory failure resolved.  TTE as above. -Discharge on p.o. torsemide 20 mg daily per cardiology -GDMT-Toprol-XL and low-dose benazepril   Elevated troponin/ Anterior septal wall NSTEMI: Declined left heart catheterization.  Treated with IV heparin and discharged on Toprol-XL, low-dose benazepril, Plavix and Zetia.  Has a statin allergy.   Acute systolic CHF exacerbation: As above.    Solitary pulmonary nodule in the right upper lobe: Noted on the CT scan.  5 mm in size.  Doubt need for follow-up on this given guarded prognosis due to underlying heart disease.   Normocytic anemia: Relatively stable. Ferritin level of 56.  Iron level low at 26.  Vitamin B12 level elevated.  Will continue to monitor.   Hyponatremia: Improved.  Likely due to CHF.   Essential hypertension -Cardiac meds as below   GERD (gastroesophageal reflux disease) -Continue Pepcid   BPH (benign prostatic hyperplasia) -Continue proscar and flomax            Time spent 35 minutes  Vital signs Vitals:   12/06/22 0523 12/06/22 0815 12/06/22 0824  BP: 111/63 122/65 122/65  Pulse: 85  85  Temp: 97.9 F (36.6 C)  98.6 F (37 C)  Resp: 20 19 18  There is moderate calcification of the aortic valve. There is mild thickening of the aortic valve. There is mild aortic valve annular calcification. Aortic valve regurgitation is mild. Aortic regurgitation PHT  measures 291 msec. Aortic valve sclerosis/calcification is present, without any evidence of aortic stenosis. Pulmonic Valve: The pulmonic valve was normal in structure. Pulmonic valve regurgitation is trivial. Aorta: The aortic root is normal in size and structure. Venous: The inferior vena cava is dilated in size with less than 50% respiratory variability, suggesting right atrial pressure of 15 mmHg. IAS/Shunts: No atrial level shunt detected by color flow Doppler.  LEFT VENTRICLE PLAX 2D LVIDd:         4.30 cm LVIDs:         4.00 cm LV PW:         1.00 cm LV IVS:        0.80 cm LVOT diam:     1.90 cm LV SV:         46 LV SV Index:   21 LVOT Area:     2.84 cm  RIGHT VENTRICLE RV Basal diam:  2.70 cm TAPSE (M-mode): 1.6 cm LEFT ATRIUM             Index        RIGHT ATRIUM           Index LA diam:        3.40 cm 1.58 cm/m   RA Area:     14.70 cm LA Vol (A2C):   39.7 ml 18.45 ml/m  RA Volume:   40.50 ml  18.82 ml/m LA Vol (A4C):   47.5 ml 22.08 ml/m LA Biplane Vol: 45.7 ml 21.24 ml/m  AORTIC VALVE LVOT Vmax:   94.67 cm/s LVOT Vmean:  63.600 cm/s LVOT VTI:    0.161 m AI PHT:      291 msec  AORTA Ao Root diam: 3.10 cm TRICUSPID VALVE TR Peak grad:   45.4 mmHg TR Vmax:        337.00 cm/s  SHUNTS Systemic VTI:  0.16 m Systemic Diam: 1.90 cm Orpah Cobb MD Electronically signed by Orpah Cobb MD Signature Date/Time: 12/04/2022/12:20:49 PM    Final    VAS [place] LOWER  EXTREMITY VENOUS (DVT) (7a-7p)  Result Date: 12/03/2022  Lower Venous DVT Study Patient Name:  Darin [name]  Date of Exam:   12/03/2022 Medical Rec #: [id number]       Accession #:    [id number] Date of Birth: June 17, 1931        Patient Gender: M Patient Age:   58 years Exam Location:  [facility] Procedure:      VAS [place] LOWER EXTREMITY VENOUS (DVT) Referring Phys: JON KNAPP --------------------------------------------------------------------------------  Indications: Swelling, and SOB.  Comparison Study: No prior left sided studies Performing Technologist: Marilynne Halsted RDMS, RVT  Examination Guidelines: A complete evaluation includes B-mode imaging, spectral Doppler, color Doppler, and power Doppler as needed of all accessible portions of each vessel. Bilateral testing is considered an integral part of a complete examination. Limited examinations for reoccurring indications may be performed as noted. The reflux portion of the exam is performed with the patient in reverse Trendelenburg.  +-----+---------------+---------+-----------+----------+--------------+ RIGHTCompressibilityPhasicitySpontaneityPropertiesThrombus Aging +-----+---------------+---------+-----------+----------+--------------+ CFV  Full           Yes      Yes                                 +-----+---------------+---------+-----------+----------+--------------+  Physician Discharge Summary  Darin [name] ZOX:[id number] DOB: 13-Apr-1931 DOA: 12/03/2022  PCP: [name], MD  Admit date: 12/03/2022 Discharge date: 12/06/2022 Admitted From: Home Disposition: Home with home hospice Recommendations for Outpatient Follow-up:  Defer PCP and cardiology follow-up to hospice.  Home Health: Home hospice and personal aide Equipment/Devices: Per hospice  Discharge Condition: Stable but guarded prognosis CODE STATUS: DNR/DNI  Follow-up Information     Orpah Cobb, MD Follow up in 1 week(s).   Specialty: Cardiology Contact information: [address] [place] [address] [phone]                 Hospital course  89 y.o. male with medical history significant of GERD, HTN, hyperlipidemia, neuropathy, PMR presented to hospital with shortness of breath for 1 day with dry cough and dyspnea on exertion even on minimal ambulation.  In the ED patient was noted to be hypoxic with pulse ox of 78% on room air and was put on CPAP.  In the ED, patient was tachycardic.  COVID flu and RSV was negative.  Labs showed hyponatremia with sodium of 130.  Chest x-ray showed new symmetric infiltrates in both lungs with small pleural effusions.  Patient was given 40 mg of IV Lasix placed on CPAP and was admitted hospital with acute hypoxic respiratory failure due to acute systolic CHF and non-STEMI.  Cardiology consulted.  TTE with LVEF of 20 to 25%, G1 DD, RWMA and moderately elevated PASP.   Patient declined left heart catheterization. He was treated with IV Lasix, IV heparin and GDMT with improvement in symptoms.  He was also started on amiodarone by cardiology.   On the day of discharge, he was liberated of oxygen and cleared for discharge by cardiology.  He is not discharged home with home hospice.  Discharge medications as below.   See individual problem list below for more.   Problems addressed during this hospitalization Principal Problem:    Acute respiratory failure with hypoxia (HCC) Active Problems:   CHF exacerbation (HCC)   Elevated troponin/likely NSTEMI   Normocytic anemia   Hyponatremia   Essential hypertension   GERD (gastroesophageal reflux disease)   BPH (benign prostatic hyperplasia)   Acute respiratory failure with hypoxia due to acute CHF and non-STEMI: Respiratory failure resolved.  TTE as above. -Discharge on p.o. torsemide 20 mg daily per cardiology -GDMT-Toprol-XL and low-dose benazepril   Elevated troponin/ Anterior septal wall NSTEMI: Declined left heart catheterization.  Treated with IV heparin and discharged on Toprol-XL, low-dose benazepril, Plavix and Zetia.  Has a statin allergy.   Acute systolic CHF exacerbation: As above.    Solitary pulmonary nodule in the right upper lobe: Noted on the CT scan.  5 mm in size.  Doubt need for follow-up on this given guarded prognosis due to underlying heart disease.   Normocytic anemia: Relatively stable. Ferritin level of 56.  Iron level low at 26.  Vitamin B12 level elevated.  Will continue to monitor.   Hyponatremia: Improved.  Likely due to CHF.   Essential hypertension -Cardiac meds as below   GERD (gastroesophageal reflux disease) -Continue Pepcid   BPH (benign prostatic hyperplasia) -Continue proscar and flomax            Time spent 35 minutes  Vital signs Vitals:   12/06/22 0523 12/06/22 0815 12/06/22 0824  BP: 111/63 122/65 122/65  Pulse: 85  85  Temp: 97.9 F (36.6 C)  98.6 F (37 C)  Resp: 20 19 18

## 2022-12-06 NOTE — Progress Notes (Signed)
Approximately 1700-- [facility] arrived to transport pt home. At time of arrival, pt A&O x 4 and VSS. Pt currently on RA with all PIVs removed--sites WDL. AVS discharge instructions provided to pt with teach-back technique utilized. Pt left in personal clothes with all belongings. Telemetry disconnected and notified.  Handoff provided by this RN to [facility] staff. All questions answered at this time.  This RN called pt's home aid, Cordelia Pen, and informed her that transport has arrived to pick-up patient. Cordelia Pen verbalized understanding and stated she was waiting at the pt's house for him. All questions answered at this time.

## 2022-12-06 NOTE — TOC Progression Note (Addendum)
Transition of Care [facility]) - Progression Note    Patient Details  Name: Darin [name] MRN: [id number] Date of Birth: 1931/10/13  Transition of Care [facility] CM/SW Contact  [name], RN Phone Number: 12/06/2022, 11:27 AM  Clinical Narrative:    Patient states he has an aide that comes in at 2 pm and if he is dc today he will need ambulance transport.   His aide is from [facility] and her hours are from 8-12 and 5 to 8 pm.  Mon thru Friday.  The rep from [facility] care is in the room with patient now setting up times for Saturday and Sunday for the aide to be with patient.     Expected Discharge Plan: Home w Home Health Services Barriers to Discharge: Continued Medical Work up  Expected Discharge Plan and Services In-house Referral: NA Discharge Planning Services: CM Consult Post Acute Care Choice: NA Living arrangements for the past 2 months: Single Family Home Expected Discharge Date: 12/06/22                 DME Agency: NA       HH Arranged: NA           Social Determinants of Health (SDOH) Interventions SDOH Screenings   Food Insecurity: No Food Insecurity (12/03/2022)  Housing: Low Risk  (12/03/2022)  Transportation Needs: No Transportation Needs (12/03/2022)  Utilities: Not At Risk (12/03/2022)  Alcohol Screen: Low Risk  (08/30/2022)  Depression (PHQ2-9): Low Risk  (08/30/2022)  Financial Resource Strain: Low Risk  (08/30/2022)  Physical Activity: Sufficiently Active (08/30/2022)  Social Connections: Moderately Integrated (08/30/2022)  Stress: No Stress Concern Present (08/30/2022)  Tobacco Use: Low Risk  (12/03/2022)    Readmission Risk Interventions     No data to display

## 2022-12-11 ENCOUNTER — Other Ambulatory Visit: Payer: Self-pay | Admitting: Family Medicine

## 2022-12-11 NOTE — Progress Notes (Unsigned)
[name] [address] [place] [address] Phone: [phone] Subjective:   [name], am serving as a scribe for Dr. Antoine Primas.  I'm seeing this patient by the request  of:  [name], MD  CC: Finger pain and back pain.  NUU:[id number]  10/09/2022 Chronic, with exacerbation. The patient wants to avoid any surgical intervention. Discussed icing regimen and home exercises, discussed which activities to do and which ones to avoid. Patient has responded to this previously and hopefully will make significant progress again. Follow-up again in 3 months   A1 pulley injection given today.  Seem to be more of this being very concerned than the true A2 pulley.  We discussed with patient to avoid certain repetitive activities if possible.  Knows about bracing at night.  Follow-up with me again in 6 to 8 weeks.     Update 12/12/2022 Darin [name] is a 89 y.o. male coming in with complaint of L SI joint and L ring finger pain. Patient states injection in finger didn't work. Swollen some days. Everything else is doing alright.  Patient noticed date actually now that he is thinking about it has seemed to help.  Did not think about it while he was recently hospitalized for likely heart condition.  Patient is still feeling fairly fatigued from that.      Past Medical History:  Diagnosis Date   Arthritis    DUPUYTREN'S CONTRACTURE, RIGHT 07/04/2009   Annotation: 4th digit Qualifier: Diagnosis of  By: Lovell Sheehan MD, Balinda Quails    GERD (gastroesophageal reflux disease)    H/O hiatal hernia    "FOR YEARS" " NO PROBLEM"   Hyperlipidemia    Hypertension    Knee pain    Neuropathy    PERIPHERAL    Osteoporosis    PMR (polymyalgia rheumatica) (HCC)    Polymyalgia (HCC) 5 YEARS   Past Surgical History:  Procedure Laterality Date   CARPAL TUNNEL RELEASE     CATARACT EXTRACTION     right   COLONOSCOPY  2007   INGUINAL HERNIA REPAIR  10/14/2011    Procedure: HERNIA REPAIR INGUINAL ADULT;  Surgeon: Shelly Rubenstein, MD;  Location: [facility] OR;  Service: General;  Laterality: Right;  Right Inguinal Hernia Repair with Mesh   TIBIA IM NAIL INSERTION Right 05/22/2021   Procedure: INTRAMEDULLARY (IM) NAIL TIBIAL;  Surgeon: Myrene Galas, MD;  Location: [facility] OR;  Service: Orthopedics;  Laterality: Right;   TONSILLECTOMY     VARICOSE VEIN SURGERY     Social History   Socioeconomic History   Marital status: Widowed    Spouse name: Not on file   Number of children: Not on file   Years of education: Not on file   Highest education level: Master's degree (e.g., MA, MS, MEng, MEd, MSW, MBA)  Occupational History   Occupation: retired    Associate Professor: RETIRED  Tobacco Use   Smoking status: Never   Smokeless tobacco: Never  Vaping Use   Vaping status: Never Used  Substance and Sexual Activity   Alcohol use: Not Currently    Alcohol/week: 3.0 standard drinks of alcohol    Types: 3 Glasses of wine per week    Comment: half glass wine before dinner   Drug use: No   Sexual activity: Not Currently  Other Topics Concern   Not on file  Social History Narrative   Not on file   Social Determinants of Health   Financial Resource Strain:  Low Risk  (08/30/2022)   Overall Financial Resource Strain (CARDIA)    Difficulty of Paying Living Expenses: Not hard at all  Food Insecurity: No Food Insecurity (12/03/2022)   Hunger Vital Sign    Worried About Running Out of Food in the Last Year: Never true    Ran Out of Food in the Last Year: Never true  Transportation Needs: No Transportation Needs (12/03/2022)   PRAPARE - Administrator, Civil Service (Medical): No    Lack of Transportation (Non-Medical): No  Physical Activity: Sufficiently Active (08/30/2022)   Exercise Vital Sign    Days of Exercise per Week: 7 days    Minutes of Exercise per Session: 40 min  Stress: No Stress Concern Present (08/30/2022)   [employer] of Occupational Health -  Occupational Stress Questionnaire    Feeling of Stress : Not at all  Social Connections: Moderately Integrated (08/30/2022)   Social Connection and Isolation Panel [NHANES]    Frequency of Communication with Friends and Family: More than three times a week    Frequency of Social Gatherings with Friends and Family: More than three times a week    Attends Religious Services: More than 4 times per year    Active Member of [employer] or Organizations: Yes    Attends Banker Meetings: More than 4 times per year    Marital Status: Widowed   Allergies  Allergen Reactions   Statins Other (See Comments)    Muscle pain and upset stomach  Other Reaction(s): leg pain, stoamch   Family History  Problem Relation Age of Onset   Heart disease Father    Coronary artery disease Other      Current Outpatient Medications (Cardiovascular):    amiodarone (PACERONE) 200 MG tablet, Take 1 tablet (200 mg total) by mouth 2 (two) times daily for 5 days, THEN 1 tablet (200 mg total) daily.   benazepril (LOTENSIN) 5 MG tablet, Take 1 tablet (5 mg total) by mouth daily.   ezetimibe (ZETIA) 10 MG tablet, Take 1 tablet (10 mg total) by mouth daily.   metoprolol succinate (TOPROL-XL) 25 MG 24 hr tablet, Take 1 tablet (25 mg total) by mouth daily.   torsemide (DEMADEX) 20 MG tablet, Take 1 tablet (20 mg total) by mouth daily.    Current Outpatient Medications (Hematological):    clopidogrel (PLAVIX) 75 MG tablet, Take 1 tablet (75 mg total) by mouth daily.  Current Outpatient Medications (Other):    famotidine (PEPCID) 20 MG tablet, TAKE 1 TABLET AT BEDTIME   finasteride (PROSCAR) 5 MG tablet, TAKE ONE TABLET BY MOUTH ONCE DAILY   gabapentin (NEURONTIN) 100 MG capsule, TAKE 1 CAPSULE 4 TIMES     DAILY (Patient taking differently: Take 200 mg by mouth 2 (two) times daily.)   polyethylene glycol (MIRALAX / GLYCOLAX) packet, Take 17 g by mouth daily as needed for moderate constipation. (Patient taking  differently: Take 17 g by mouth 2 (two) times daily.)   Reviewed prior external information including notes and imaging from  primary care provider As well as notes that were available from care everywhere and other healthcare systems.  Past medical history, social, surgical and family history all reviewed in electronic medical record.  No pertanent information unless stated regarding to the chief complaint.   Review of Systems:  No headache, visual changes, nausea, vomiting, diarrhea, constipation, dizziness, abdominal pain, skin rash, fevers, chills, night sweats, weight loss, swollen lymph nodes, body aches, joint swelling, chest pain,  shortness of breath, mood changes. POSITIVE muscle aches, fatigue  Objective  Blood pressure 112/68, pulse 80, height 6\' 1"  (1.854 m), SpO2 99%.   General: No apparent distress alert and oriented x3 mood and affect normal, dressed appropriately.  HEENT: Pupils equal, extraocular movements intact  Respiratory: Patient's speak in full sentences and does not appear short of breath  Cardiovascular: No lower extremity edema, non tender, no erythema  Patient is walking with the aid of a walker.  Does seem to be fairly deconditioned at the moment.  Tender to palpation. Hand exam does not show a true nodule noted if the trigger fingers at the moment.  Good grip strength noted.  Patient is sitting relatively comfortably.  He does not seem to be short of breath while patient is sitting.   Impression and Recommendations:    The above documentation has been reviewed and is accurate and complete Judi Saa, DO

## 2022-12-12 ENCOUNTER — Ambulatory Visit (INDEPENDENT_AMBULATORY_CARE_PROVIDER_SITE_OTHER): Payer: Medicare Other | Admitting: Family Medicine

## 2022-12-12 VITALS — BP 112/68 | HR 80 | Ht 73.0 in

## 2022-12-12 DIAGNOSIS — M65331 Trigger finger, right middle finger: Secondary | ICD-10-CM | POA: Diagnosis not present

## 2022-12-12 NOTE — Patient Instructions (Signed)
Moist heat massage to finger See you again in 5-6 weeks We are here if you need [place]

## 2022-12-12 NOTE — Assessment & Plan Note (Signed)
Patient actually seems to be doing better actually when we do see him.  Left ring finger as well.  Discussed icing regimen and home exercises.  Increase activity slowly.  Follow-up with me 6 weeks just because patient does seem to be somewhat deconditioned after patient's hospital stay.  Patient declined any home health nursing or home health physical therapy.  We did discuss with him if the fatigue seems to worsen to seek medical attention immediately.

## 2022-12-16 DIAGNOSIS — I2510 Atherosclerotic heart disease of native coronary artery without angina pectoris: Secondary | ICD-10-CM | POA: Diagnosis not present

## 2022-12-16 DIAGNOSIS — R0602 Shortness of breath: Secondary | ICD-10-CM | POA: Diagnosis not present

## 2022-12-16 DIAGNOSIS — I10 Essential (primary) hypertension: Secondary | ICD-10-CM | POA: Diagnosis not present

## 2022-12-16 DIAGNOSIS — I5022 Chronic systolic (congestive) heart failure: Secondary | ICD-10-CM | POA: Diagnosis not present

## 2022-12-20 ENCOUNTER — Telehealth: Payer: Self-pay | Admitting: Family Medicine

## 2022-12-20 DIAGNOSIS — I10 Essential (primary) hypertension: Secondary | ICD-10-CM

## 2022-12-20 NOTE — Telephone Encounter (Signed)
Pt states he is not pleased with his current cardiologist and would like a referral to another cardiologist

## 2022-12-23 NOTE — Telephone Encounter (Signed)
Referral placed and left message for patient to return my call.

## 2022-12-23 NOTE — Addendum Note (Signed)
Addended by: Christy Sartorius on: 12/23/2022 10:56 AM   Modules accepted: Orders

## 2022-12-24 ENCOUNTER — Ambulatory Visit (INDEPENDENT_AMBULATORY_CARE_PROVIDER_SITE_OTHER): Payer: Medicare Other

## 2022-12-24 DIAGNOSIS — Z23 Encounter for immunization: Secondary | ICD-10-CM

## 2022-12-24 NOTE — Telephone Encounter (Signed)
Patient informed referral was placed  

## 2023-01-15 ENCOUNTER — Other Ambulatory Visit: Payer: Self-pay | Admitting: Family Medicine

## 2023-01-17 ENCOUNTER — Other Ambulatory Visit: Payer: Self-pay

## 2023-01-17 MED ORDER — GABAPENTIN 100 MG PO CAPS: ORAL_CAPSULE | ORAL | 0 refills | Status: DC

## 2023-01-21 ENCOUNTER — Other Ambulatory Visit: Payer: Self-pay

## 2023-01-21 ENCOUNTER — Emergency Department (HOSPITAL_COMMUNITY): Payer: Medicare Other

## 2023-01-21 ENCOUNTER — Inpatient Hospital Stay (HOSPITAL_COMMUNITY)
Admission: EM | Admit: 2023-01-21 | Discharge: 2023-01-25 | DRG: 291 | Disposition: A | Discharge status: Home Health | Payer: Medicare Other | Attending: Internal Medicine | Admitting: Internal Medicine

## 2023-01-21 DIAGNOSIS — R0789 Other chest pain: Secondary | ICD-10-CM | POA: Diagnosis not present

## 2023-01-21 DIAGNOSIS — J810 Acute pulmonary edema: Secondary | ICD-10-CM | POA: Diagnosis not present

## 2023-01-21 DIAGNOSIS — R0989 Other specified symptoms and signs involving the circulatory and respiratory systems: Secondary | ICD-10-CM | POA: Diagnosis not present

## 2023-01-21 DIAGNOSIS — I5023 Acute on chronic systolic (congestive) heart failure: Secondary | ICD-10-CM | POA: Diagnosis present

## 2023-01-21 DIAGNOSIS — D649 Anemia, unspecified: Secondary | ICD-10-CM | POA: Diagnosis present

## 2023-01-21 DIAGNOSIS — N390 Urinary tract infection, site not specified: Secondary | ICD-10-CM | POA: Diagnosis not present

## 2023-01-21 DIAGNOSIS — I2510 Atherosclerotic heart disease of native coronary artery without angina pectoris: Secondary | ICD-10-CM | POA: Diagnosis present

## 2023-01-21 DIAGNOSIS — E875 Hyperkalemia: Secondary | ICD-10-CM | POA: Diagnosis present

## 2023-01-21 DIAGNOSIS — N179 Acute kidney failure, unspecified: Principal | ICD-10-CM

## 2023-01-21 DIAGNOSIS — R570 Cardiogenic shock: Secondary | ICD-10-CM | POA: Diagnosis not present

## 2023-01-21 DIAGNOSIS — R932 Abnormal findings on diagnostic imaging of liver and biliary tract: Secondary | ICD-10-CM | POA: Diagnosis not present

## 2023-01-21 DIAGNOSIS — I10 Essential (primary) hypertension: Secondary | ICD-10-CM | POA: Diagnosis not present

## 2023-01-21 DIAGNOSIS — I255 Ischemic cardiomyopathy: Secondary | ICD-10-CM | POA: Diagnosis present

## 2023-01-21 DIAGNOSIS — Z79899 Other long term (current) drug therapy: Secondary | ICD-10-CM | POA: Diagnosis not present

## 2023-01-21 DIAGNOSIS — R748 Abnormal levels of other serum enzymes: Secondary | ICD-10-CM | POA: Diagnosis not present

## 2023-01-21 DIAGNOSIS — Z66 Do not resuscitate: Secondary | ICD-10-CM | POA: Diagnosis present

## 2023-01-21 DIAGNOSIS — Z7401 Bed confinement status: Secondary | ICD-10-CM | POA: Diagnosis not present

## 2023-01-21 DIAGNOSIS — N400 Enlarged prostate without lower urinary tract symptoms: Secondary | ICD-10-CM | POA: Diagnosis present

## 2023-01-21 DIAGNOSIS — E785 Hyperlipidemia, unspecified: Secondary | ICD-10-CM | POA: Diagnosis present

## 2023-01-21 DIAGNOSIS — K219 Gastro-esophageal reflux disease without esophagitis: Secondary | ICD-10-CM | POA: Diagnosis present

## 2023-01-21 DIAGNOSIS — M353 Polymyalgia rheumatica: Secondary | ICD-10-CM | POA: Diagnosis present

## 2023-01-21 DIAGNOSIS — I110 Hypertensive heart disease with heart failure: Secondary | ICD-10-CM | POA: Diagnosis not present

## 2023-01-21 DIAGNOSIS — I083 Combined rheumatic disorders of mitral, aortic and tricuspid valves: Secondary | ICD-10-CM | POA: Diagnosis present

## 2023-01-21 DIAGNOSIS — I4892 Unspecified atrial flutter: Secondary | ICD-10-CM | POA: Diagnosis present

## 2023-01-21 DIAGNOSIS — Z85828 Personal history of other malignant neoplasm of skin: Secondary | ICD-10-CM | POA: Diagnosis not present

## 2023-01-21 DIAGNOSIS — E871 Hypo-osmolality and hyponatremia: Secondary | ICD-10-CM | POA: Diagnosis not present

## 2023-01-21 DIAGNOSIS — K4090 Unilateral inguinal hernia, without obstruction or gangrene, not specified as recurrent: Secondary | ICD-10-CM | POA: Diagnosis not present

## 2023-01-21 DIAGNOSIS — Z8249 Family history of ischemic heart disease and other diseases of the circulatory system: Secondary | ICD-10-CM

## 2023-01-21 DIAGNOSIS — Z7902 Long term (current) use of antithrombotics/antiplatelets: Secondary | ICD-10-CM

## 2023-01-21 DIAGNOSIS — R231 Pallor: Secondary | ICD-10-CM | POA: Diagnosis not present

## 2023-01-21 DIAGNOSIS — E039 Hypothyroidism, unspecified: Secondary | ICD-10-CM | POA: Diagnosis present

## 2023-01-21 DIAGNOSIS — I4819 Other persistent atrial fibrillation: Secondary | ICD-10-CM | POA: Diagnosis present

## 2023-01-21 DIAGNOSIS — I959 Hypotension, unspecified: Secondary | ICD-10-CM

## 2023-01-21 DIAGNOSIS — E0781 Sick-euthyroid syndrome: Secondary | ICD-10-CM | POA: Diagnosis not present

## 2023-01-21 DIAGNOSIS — R110 Nausea: Secondary | ICD-10-CM | POA: Diagnosis not present

## 2023-01-21 DIAGNOSIS — I509 Heart failure, unspecified: Secondary | ICD-10-CM

## 2023-01-21 DIAGNOSIS — A419 Sepsis, unspecified organism: Secondary | ICD-10-CM | POA: Diagnosis not present

## 2023-01-21 DIAGNOSIS — I517 Cardiomegaly: Secondary | ICD-10-CM | POA: Diagnosis not present

## 2023-01-21 DIAGNOSIS — I5043 Acute on chronic combined systolic (congestive) and diastolic (congestive) heart failure: Secondary | ICD-10-CM | POA: Diagnosis not present

## 2023-01-21 DIAGNOSIS — I252 Old myocardial infarction: Secondary | ICD-10-CM

## 2023-01-21 DIAGNOSIS — I2729 Other secondary pulmonary hypertension: Secondary | ICD-10-CM | POA: Diagnosis present

## 2023-01-21 DIAGNOSIS — Z888 Allergy status to other drugs, medicaments and biological substances status: Secondary | ICD-10-CM

## 2023-01-21 DIAGNOSIS — R079 Chest pain, unspecified: Secondary | ICD-10-CM | POA: Diagnosis not present

## 2023-01-21 DIAGNOSIS — Z515 Encounter for palliative care: Secondary | ICD-10-CM

## 2023-01-21 DIAGNOSIS — I5021 Acute systolic (congestive) heart failure: Secondary | ICD-10-CM | POA: Diagnosis not present

## 2023-01-21 DIAGNOSIS — Z7189 Other specified counseling: Secondary | ICD-10-CM | POA: Diagnosis not present

## 2023-01-21 DIAGNOSIS — R001 Bradycardia, unspecified: Secondary | ICD-10-CM | POA: Diagnosis not present

## 2023-01-21 DIAGNOSIS — R188 Other ascites: Secondary | ICD-10-CM | POA: Diagnosis not present

## 2023-01-21 DIAGNOSIS — K5730 Diverticulosis of large intestine without perforation or abscess without bleeding: Secondary | ICD-10-CM | POA: Diagnosis not present

## 2023-01-21 DIAGNOSIS — J90 Pleural effusion, not elsewhere classified: Secondary | ICD-10-CM | POA: Diagnosis not present

## 2023-01-21 LAB — CBC WITH DIFFERENTIAL/PLATELET
Abs Immature Granulocytes: 0.02 10*3/uL (ref 0.00–0.07)
Basophils Absolute: 0 10*3/uL (ref 0.0–0.1)
Basophils Relative: 1 %
Eosinophils Absolute: 0.1 10*3/uL (ref 0.0–0.5)
Eosinophils Relative: 1 %
HCT: 33.8 % — ABNORMAL LOW (ref 39.0–52.0)
Hemoglobin: 10.6 g/dL — ABNORMAL LOW (ref 13.0–17.0)
Immature Granulocytes: 0 %
Lymphocytes Relative: 10 %
Lymphs Abs: 0.6 10*3/uL — ABNORMAL LOW (ref 0.7–4.0)
MCH: 30.5 pg (ref 26.0–34.0)
MCHC: 31.4 g/dL (ref 30.0–36.0)
MCV: 97.4 fL (ref 80.0–100.0)
Monocytes Absolute: 0.7 10*3/uL (ref 0.1–1.0)
Monocytes Relative: 11 %
Neutro Abs: 4.7 10*3/uL (ref 1.7–7.7)
Neutrophils Relative %: 77 %
Platelets: 207 10*3/uL (ref 150–400)
RBC: 3.47 MIL/uL — ABNORMAL LOW (ref 4.22–5.81)
RDW: 14.4 % (ref 11.5–15.5)
WBC: 6 10*3/uL (ref 4.0–10.5)
nRBC: 0 % (ref 0.0–0.2)

## 2023-01-21 LAB — MRSA NEXT GEN BY PCR, NASAL: MRSA by PCR Next Gen: NOT DETECTED

## 2023-01-21 LAB — MAGNESIUM: Magnesium: 3.6 mg/dL — ABNORMAL HIGH (ref 1.7–2.4)

## 2023-01-21 LAB — TROPONIN I (HIGH SENSITIVITY)
Troponin I (High Sensitivity): 10 ng/L (ref <18)
Troponin I (High Sensitivity): 11 ng/L (ref <18)

## 2023-01-21 LAB — HEPATIC FUNCTION PANEL
ALT: 62 U/L — ABNORMAL HIGH (ref 0–44)
AST: 58 U/L — ABNORMAL HIGH (ref 15–41)
Albumin: 3 g/dL — ABNORMAL LOW (ref 3.5–5.0)
Alkaline Phosphatase: 112 U/L (ref 38–126)
Bilirubin, Direct: <0.1 mg/dL (ref 0.0–0.2)
Total Bilirubin: 0.5 mg/dL (ref 0.3–1.2)
Total Protein: 6.5 g/dL (ref 6.5–8.1)

## 2023-01-21 LAB — BASIC METABOLIC PANEL
Anion gap: 10 (ref 5–15)
BUN: 77 mg/dL — ABNORMAL HIGH (ref 8–23)
CO2: 20 mmol/L — ABNORMAL LOW (ref 22–32)
Calcium: 8.3 mg/dL — ABNORMAL LOW (ref 8.9–10.3)
Chloride: 104 mmol/L (ref 98–111)
Creatinine, Ser: 2.28 mg/dL — ABNORMAL HIGH (ref 0.61–1.24)
GFR, Estimated: 27 mL/min — ABNORMAL LOW (ref >60)
Glucose, Bld: 121 mg/dL — ABNORMAL HIGH (ref 70–99)
Potassium: 5.8 mmol/L — ABNORMAL HIGH (ref 3.5–5.1)
Sodium: 134 mmol/L — ABNORMAL LOW (ref 135–145)

## 2023-01-21 LAB — BRAIN NATRIURETIC PEPTIDE: B Natriuretic Peptide: 604.5 pg/mL — ABNORMAL HIGH (ref 0.0–100.0)

## 2023-01-21 LAB — TSH: TSH: 51.031 u[IU]/mL — ABNORMAL HIGH (ref 0.350–4.500)

## 2023-01-21 LAB — T4, FREE: Free T4: 0.74 ng/dL (ref 0.61–1.12)

## 2023-01-21 LAB — CK: Total CK: 73 U/L (ref 49–397)

## 2023-01-21 LAB — GLUCOSE, CAPILLARY: Glucose-Capillary: 132 mg/dL — ABNORMAL HIGH (ref 70–99)

## 2023-01-21 MED ORDER — HYDROCORTISONE SOD SUC (PF) 100 MG IJ SOLR
50.0000 mg | Freq: Once | INTRAMUSCULAR | Status: AC
  Administered 2023-01-21: 50 mg via INTRAVENOUS
  Filled 2023-01-21: qty 2

## 2023-01-21 MED ORDER — LACTATED RINGERS IV BOLUS: 500.0000 mL | Freq: Once | INTRAVENOUS | Status: DC

## 2023-01-21 MED ORDER — ORAL CARE MOUTH RINSE: 15.0000 mL | OROMUCOSAL | Status: DC | PRN

## 2023-01-21 MED ORDER — FAMOTIDINE 20 MG PO TABS
20.0000 mg | ORAL_TABLET | Freq: Every day | ORAL | Status: DC
  Administered 2023-01-21 – 2023-01-24 (×4): 20 mg via ORAL
  Filled 2023-01-21 (×4): qty 1

## 2023-01-21 MED ORDER — POLYETHYLENE GLYCOL 3350 17 G PO PACK
17.0000 g | PACK | Freq: Every day | ORAL | Status: DC | PRN
  Administered 2023-01-24: 17 g via ORAL
  Filled 2023-01-21 (×3): qty 1

## 2023-01-21 MED ORDER — ONDANSETRON HCL 4 MG/2ML IJ SOLN
4.0000 mg | Freq: Once | INTRAMUSCULAR | Status: AC
  Administered 2023-01-21: 4 mg via INTRAVENOUS
  Filled 2023-01-21: qty 2

## 2023-01-21 MED ORDER — SODIUM CHLORIDE 0.9 % IV SOLN
250.0000 mL | INTRAVENOUS | Status: DC
  Administered 2023-01-21: 250 mL via INTRAVENOUS

## 2023-01-21 MED ORDER — SODIUM ZIRCONIUM CYCLOSILICATE 10 G PO PACK
10.0000 g | PACK | Freq: Once | ORAL | Status: AC
  Administered 2023-01-21: 10 g via ORAL
  Filled 2023-01-21: qty 1

## 2023-01-21 MED ORDER — CLOPIDOGREL BISULFATE 75 MG PO TABS
75.0000 mg | ORAL_TABLET | Freq: Every day | ORAL | Status: DC
  Administered 2023-01-21 – 2023-01-25 (×5): 75 mg via ORAL
  Filled 2023-01-21 (×5): qty 1

## 2023-01-21 MED ORDER — EZETIMIBE 10 MG PO TABS
10.0000 mg | ORAL_TABLET | Freq: Every day | ORAL | Status: DC
  Administered 2023-01-21 – 2023-01-25 (×5): 10 mg via ORAL
  Filled 2023-01-21 (×5): qty 1

## 2023-01-21 MED ORDER — NOREPINEPHRINE 4 MG/250ML-% IV SOLN: 0.0000 ug/min | INTRAVENOUS | Status: DC

## 2023-01-21 MED ORDER — FUROSEMIDE 10 MG/ML IJ SOLN
80.0000 mg | Freq: Once | INTRAMUSCULAR | Status: AC
  Administered 2023-01-21: 80 mg via INTRAVENOUS
  Filled 2023-01-21: qty 8

## 2023-01-21 MED ORDER — DOCUSATE SODIUM 100 MG PO CAPS
100.0000 mg | ORAL_CAPSULE | Freq: Two times a day (BID) | ORAL | Status: DC | PRN
  Administered 2023-01-23 – 2023-01-24 (×3): 100 mg via ORAL
  Filled 2023-01-21 (×3): qty 1

## 2023-01-21 MED ORDER — NOREPINEPHRINE 4 MG/250ML-% IV SOLN
0.0000 ug/min | INTRAVENOUS | Status: DC
  Administered 2023-01-21: 2 ug/min via INTRAVENOUS
  Filled 2023-01-21: qty 250

## 2023-01-21 MED ORDER — HEPARIN SODIUM (PORCINE) 5000 UNIT/ML IJ SOLN
5000.0000 [IU] | Freq: Three times a day (TID) | INTRAMUSCULAR | Status: DC
  Administered 2023-01-21 – 2023-01-25 (×11): 5000 [IU] via SUBCUTANEOUS
  Filled 2023-01-21 (×11): qty 1

## 2023-01-21 MED ORDER — LEVOTHYROXINE SODIUM 25 MCG PO TABS
25.0000 ug | ORAL_TABLET | Freq: Once | ORAL | Status: AC
  Administered 2023-01-21: 25 ug via ORAL
  Filled 2023-01-21: qty 1

## 2023-01-21 MED ORDER — FENTANYL CITRATE PF 50 MCG/ML IJ SOSY
25.0000 ug | PREFILLED_SYRINGE | Freq: Once | INTRAMUSCULAR | Status: AC
  Administered 2023-01-21: 25 ug via INTRAVENOUS
  Filled 2023-01-21: qty 1

## 2023-01-21 NOTE — Consult Note (Signed)
Cardiology Consultation   Patient ID: Darin [name] MRN: [id number]; DOB: [birth year]  Admit date: 01/21/2023 Date of Consult: 01/21/2023  PCP:  [name], MD   [facility] Providers Cardiologist:  Previously Dr. Algie Coffer --> wants to transition to [facility] Dr. Royann Shivers.  Patient Profile:   Darin [name] is a 89 y.o. male with a hx of chronic systolic heart failure, HTN, HLD, neuropathy, and polymyalgia rheumatica who is being seen 01/21/2023 for the evaluation of heart failure and rhythm management at the request of Dr. Durwin Nora.  History of Present Illness:   Mr. Darin [name] has a previous echocardiogram 05/21/2021 with an LVEF 70-75%, normal PASP, trivial MR.  He was hospitalized 11/2022 with shortness of breath and elevated troponin. Repeat echocardiogram 12/04/2022 showed a new LVEF 20-25% with WMA, grade 1 DD, moderately elevated PASP, moderate RAE, moderate MR, moderate to severe TR. CXR with pulmonary edema and EKG concerning for anteroseptal MI. HS troponin elevated to 2250. Dr. Algie Coffer was consulted and recommended heart catheterization; however, patient preferred medical therapy (refused heart cath and stenting). He was noted to be in Afib vs flutter RVR in the 110s and was started on amiodarone 11/2022. He was discharged with PO amiodarone load.   GDMT: benazepril, toprol, 25 mg daily, 20 mg torsemide daily  He presented to [facility] with chest pain starting at 0600, nausea and diaphoresis. Troponin 10.  HR 46, BP 96/52  AKI with sCr 2.28, BUN 77 K 5.8 Mg 3.6 Albumin 3.0 AST 58 ALT 62 BNP 605 TSH 51  Cardiology was consulted for chest pain, CHF exacerbation, and recommendations on rhythm management given recent amiodarone start and possible amiodarone toxicity.   He cannot identify why he feels he is volume overloaded however does feel he is SOB and has some 1+ LE swelling. He has been restricting his fluids to three cups a day as instructed and has tried to  decrease his salt intake. He is lying nearly flat with normal O2 saturations but some gurgling respiratory sounds. With sitting up, this improves. Chest pain has nearly resolved.   Past Medical History:  Diagnosis Date   Arthritis    DUPUYTREN'S CONTRACTURE, RIGHT 07/04/2009   Annotation: 4th digit Qualifier: Diagnosis of  By: Lovell Sheehan MD, Balinda Quails    GERD (gastroesophageal reflux disease)    H/O hiatal hernia    "FOR YEARS" " NO PROBLEM"   Hyperlipidemia    Hypertension    Knee pain    Neuropathy    PERIPHERAL    Osteoporosis    PMR (polymyalgia rheumatica) (HCC)    Polymyalgia (HCC) 5 YEARS    Past Surgical History:  Procedure Laterality Date   CARPAL TUNNEL RELEASE     CATARACT EXTRACTION     right   COLONOSCOPY  2007   INGUINAL HERNIA REPAIR  10/14/2011   Procedure: HERNIA REPAIR INGUINAL ADULT;  Surgeon: Shelly Rubenstein, MD;  Location: [facility] OR;  Service: General;  Laterality: Right;  Right Inguinal Hernia Repair with Mesh   TIBIA IM NAIL INSERTION Right 05/22/2021   Procedure: INTRAMEDULLARY (IM) NAIL TIBIAL;  Surgeon: Myrene Galas, MD;  Location: [facility] OR;  Service: Orthopedics;  Laterality: Right;   TONSILLECTOMY     VARICOSE VEIN SURGERY       Home Medications:  Prior to Admission medications   Medication Sig Start Date End Date Taking? Authorizing Provider  amiodarone (PACERONE) 200 MG tablet Take 1 tablet (200 mg total) by mouth 2 (two) times daily for 5  days, THEN 1 tablet (200 mg total) daily. 12/06/22 03/06/23  Almon Hercules, MD  benazepril (LOTENSIN) 5 MG tablet Take 1 tablet (5 mg total) by mouth daily. 12/07/22   Almon Hercules, MD  clopidogrel (PLAVIX) 75 MG tablet Take 1 tablet (75 mg total) by mouth daily. 12/06/22   Almon Hercules, MD  ezetimibe (ZETIA) 10 MG tablet Take 1 tablet (10 mg total) by mouth daily. 12/07/22   Almon Hercules, MD  famotidine (PEPCID) 20 MG tablet TAKE 1 TABLET AT BEDTIME 11/11/22   Burchette, Elberta Fortis, MD  finasteride (PROSCAR) 5 MG tablet TAKE  ONE TABLET BY MOUTH ONCE DAILY 01/15/23   Burchette, Elberta Fortis, MD  gabapentin (NEURONTIN) 100 MG capsule Take 2 capsules in AM and 2 in PM 01/17/23   Burchette, Elberta Fortis, MD  metoprolol succinate (TOPROL-XL) 25 MG 24 hr tablet Take 1 tablet (25 mg total) by mouth daily. 12/07/22   Almon Hercules, MD  polyethylene glycol (MIRALAX / GLYCOLAX) packet Take 17 g by mouth daily as needed for moderate constipation. Patient taking differently: Take 17 g by mouth 2 (two) times daily. 03/16/17   Glade Lloyd, MD  torsemide (DEMADEX) 20 MG tablet Take 1 tablet (20 mg total) by mouth daily. 12/06/22 03/06/23  Almon Hercules, MD    Inpatient Medications: Scheduled Meds:  furosemide  80 mg Intravenous Once   levothyroxine  25 mcg Oral Once   Continuous Infusions:  sodium chloride     norepinephrine (LEVOPHED) Adult infusion     PRN Meds:   Allergies:    Allergies  Allergen Reactions   Statins Other (See Comments)    Muscle pain and upset stomach  Other Reaction(s): leg pain, stoamch    Social History:   Social History   Socioeconomic History   Marital status: Widowed    Spouse name: Not on file   Number of children: Not on file   Years of education: Not on file   Highest education level: Master's degree (e.g., MA, MS, MEng, MEd, MSW, MBA)  Occupational History   Occupation: retired    Associate Professor: RETIRED  Tobacco Use   Smoking status: Never   Smokeless tobacco: Never  Vaping Use   Vaping status: Never Used  Substance and Sexual Activity   Alcohol use: Not Currently    Alcohol/week: 3.0 standard drinks of alcohol    Types: 3 Glasses of wine per week    Comment: half glass wine before dinner   Drug use: No   Sexual activity: Not Currently  Other Topics Concern   Not on file  Social History Narrative   Not on file   Social Determinants of Health   Financial Resource Strain: Low Risk  (08/30/2022)   Overall Financial Resource Strain (CARDIA)    Difficulty of Paying Living  Expenses: Not hard at all  Food Insecurity: No Food Insecurity (12/03/2022)   Hunger Vital Sign    Worried About Running Out of Food in the Last Year: Never true    Ran Out of Food in the Last Year: Never true  Transportation Needs: No Transportation Needs (12/03/2022)   PRAPARE - Administrator, Civil Service (Medical): No    Lack of Transportation (Non-Medical): No  Physical Activity: Sufficiently Active (08/30/2022)   Exercise Vital Sign    Days of Exercise per Week: 7 days    Minutes of Exercise per Session: 40 min  Stress: No Stress Concern Present (08/30/2022)   [place]  Institute of Occupational Health - Occupational Stress Questionnaire    Feeling of Stress : Not at all  Social Connections: Moderately Integrated (08/30/2022)   Social Connection and Isolation Panel [NHANES]    Frequency of Communication with Friends and Family: More than three times a week    Frequency of Social Gatherings with Friends and Family: More than three times a week    Attends Religious Services: More than 4 times per year    Active Member of [employer] or Organizations: Yes    Attends Banker Meetings: More than 4 times per year    Marital Status: Widowed  Intimate Partner Violence: Not At Risk (12/03/2022)   Humiliation, Afraid, Rape, and Kick questionnaire    Fear of Current or Ex-Partner: No    Emotionally Abused: No    Physically Abused: No    Sexually Abused: No    Family History:    Family History  Problem Relation Age of Onset   Heart disease Father    Coronary artery disease Other      ROS:  Please see the history of present illness.   All other ROS reviewed and negative.     Physical Exam/Data:   Vitals:   01/21/23 1633 01/21/23 1641 01/21/23 1900 01/21/23 1945  BP: (!) 96/52  (!) 100/51 (!) 97/54  Pulse: (!) 46  (!) 44 (!) 45  Resp: 13  11 17   Temp: (!) 97.5 F (36.4 C)     TempSrc: Oral     SpO2:  97% 95% 96%  Weight:       No intake or output data in the  24 hours ending 01/21/23 2013    01/21/2023    4:30 PM 12/06/2022    5:23 AM 12/05/2022    4:16 AM  Last 3 Weights  Weight (lbs) 197 lb 197 lb 8.5 oz 198 lb 3.1 oz  Weight (kg) 89.359 kg 89.6 kg 89.9 kg     Body mass index is 25.99 kg/m.  General:  NAD HEENT: normal Neck: JVD to mid 1/3 of neck at 30 deg Cardiac:  bradycardic, soft systolic murmur along sternum Lungs:  bilateral crackles Abd: soft, nontender, no hepatomegaly  Ext: 1+ edema Neuro:  no focal abnormalities noted Psych:  Normal affect   EKG:  The EKG was personally reviewed and demonstrates:  appears consistent with slow atrial flutter - VR 47, baseline difficult to discern p waves Telemetry:  Telemetry was personally reviewed and demonstrates:  HR 46 - likely atrial flutter  Relevant CV Studies:  Echo 12/04/22: 1. Left ventricular ejection fraction, by estimation, is 20 to 25%. The  left ventricle has severely decreased function. The left ventricle  demonstrates regional wall motion abnormalities (see scoring  diagram/findings for description). Left ventricular  diastolic parameters are consistent with Grade I diastolic dysfunction  (impaired relaxation).   2. Right ventricular systolic function is mildly reduced. The right  ventricular size is normal. There is moderately elevated pulmonary artery  systolic pressure.   3. Left atrial size was mildly dilated.   4. Right atrial size was moderately dilated.   5. The mitral valve is degenerative. Moderate mitral valve regurgitation.   6. Tricuspid valve regurgitation is moderate to severe.   7. The aortic valve is tricuspid. There is moderate calcification of the  aortic valve. There is mild thickening of the aortic valve. Aortic valve  regurgitation is mild. Aortic valve sclerosis/calcification is present,  without any evidence of aortic  stenosis.  8. The inferior vena cava is dilated in size with <50% respiratory  variability, suggesting right atrial pressure  of 15 mmHg.   Laboratory Data:  High Sensitivity Troponin:   Recent Labs  Lab 01/21/23 1704  TROPONINIHS 10     Chemistry Recent Labs  Lab 01/21/23 1704  NA 134*  K 5.8*  CL 104  CO2 20*  GLUCOSE 121*  BUN 77*  CREATININE 2.28*  CALCIUM 8.3*  MG 3.6*  GFRNONAA 27*  ANIONGAP 10    Recent Labs  Lab 01/21/23 1704  PROT 6.5  ALBUMIN 3.0*  AST 58*  ALT 62*  ALKPHOS 112  BILITOT 0.5   Lipids No results for input(s): "CHOL", "TRIG", "HDL", "LABVLDL", "LDLCALC", "CHOLHDL" in the last 168 hours.  Hematology Recent Labs  Lab 01/21/23 1704  WBC 6.0  RBC 3.47*  HGB 10.6*  HCT 33.8*  MCV 97.4  MCH 30.5  MCHC 31.4  RDW 14.4  PLT 207   Thyroid  Recent Labs  Lab 01/21/23 1704  TSH 51.031*    BNP Recent Labs  Lab 01/21/23 1704  BNP 604.5*    DDimer No results for input(s): "DDIMER" in the last 168 hours.   Radiology/Studies:  No results found.   Assessment and Plan:   Chest pain - recent NSTEMI 11/2022 - was discharged on plavix - previously declined heart catheterization - hs troponin 10, delta pending - CT chest today with significant 3 vessel coronary calcifications, however pt prefers medical mgmt.   Paroxysmal atrial flutter/?fibrillation  Bradycardia  - was started on amiodarone in 11/2022 - HR now bradycardic, appears possibly atrial flutter  - given significantly elevated TSH and LFTs, will stop amiodarone - hold toprol  Acute on chronic systolic heart failure Moderate RV dysfunction MR/TR Hypotension AKI  - recent echo with LVEF 20-25% with LAD infarct, with mild RV dysfunction - was maintained on benazepril, toprol, and torsemide - BNP 604 with CXR concerning for pulmonary edema and CT with pleural effusions - will start IV diuresis, suspect AKI may be renal congestion.  - ordered for 80 mg IV lasix - given hypotension will also start norepinephrine to support BP - he is on room air and examined lying flat, respirations do improve  when sitting up - will attempt diuresis overnight   Difficult situation. Suspect LFTs and TSH elevation likely due to amiodarone toxicity. Will stop amiodarone. Given bradycardia, hold toprol. Suspect an ischemic cardiomyopathy with known LVEF 20%. He appears to be stable on room air and able to rest supine without supplemental O2. AKI and hypotension complicate diuresis, stop ACEI. Will start levophed to support BP and will challenge with 80 mg IV lasix. Will need strict I&Os in the ER and after admission to determine next steps for him. No ischemic evaluation, he declined heart catheterization previously. Continue eliquis for stroke PPX. He confirms DNR, but will accept pressor support.     Risk Assessment/Risk Scores:      New York Heart Association (NYHA) Functional Class NYHA Class III  CHA2DS2-VASc Score = 5   This indicates a 7.2% annual risk of stroke. The patient's score is based upon: CHF History: 1 HTN History: 1 Diabetes History: 0 Stroke History: 0 Vascular Disease History: 1 Age Score: 2 Gender Score: 0     For questions or updates, please contact  [facility] Please consult [email or web address] for contact info under    Signed, Parke Poisson, MD  01/21/2023 8:13 PM

## 2023-01-21 NOTE — ED Notes (Signed)
Pt headed to CT

## 2023-01-21 NOTE — Progress Notes (Signed)
eLink Physician-Brief Progress Note Patient Name: Darin [name] DOB: [birth year] MRN: [id number]   Date of Service  01/21/2023  HPI/Events of Note  Patient admitted with advanced cardiomyopathy with systolic dysfunction, atrial fib-flutter with RVR, and concern for ACS and Amiodarone toxicity, cardiology is managing his cardiac issues but CCS involved due to low BP / low cardiac output state requiring Levophed.  eICU Interventions  New Patient Evaluation.        Thomasene Lot Ferrell Flam 01/21/2023, 10:51 PM

## 2023-01-21 NOTE — H&P (Signed)
NAME:  Darin [name], MRN:  [id number], DOB:  02/27/1932, LOS: 0 ADMISSION DATE:  01/21/2023, CONSULTATION DATE:  01/21/23 REFERRING MD:  Jacques Navy CHIEF COMPLAINT:  Volume Overload   History of Present Illness:  Darin [name] is a 89 y.o. male who has a PMH as belwo including but not limited to chronic combined CHF (last echo Sept 2024 with EV 20-25% with WMA, G1DD, mod MR, mod to severe TR), HTN, HLD, PMR. He presented to [facility] ED 10/29 with chest pain, nausea, diaphoresis. Workup in ED suggestive of CHF exacerbation, pulmonary edema, AKI, hypotension, bradycardia. EKG showed sinus bradycardia. He does take Benazepril, Toprol-XL, Torsemide at home and took all meds earlier on day of presentation.  He was evaluated by cardiology who felt he needed diuresis; however, he has soft pressures; therefore, was started on NE infusion. PCCM consulted for admission.  Pertinent  Medical History:  has CARCINOMA, BASAL CELL; Hyperlipidemia; POLYNEUROPATHY OTHER DISEASES CLASSIFIED ELSW; CATARACT ASSOCIATED WITH OTHER SYNDROMES; CERUMEN IMPACTION, BILATERAL; Essential hypertension; VARICOSE VEINS LOWER EXTREMITIES W/INFLAMMATION; GERD (gastroesophageal reflux disease); CONSTIPATION, DRUG INDUCED; DRY SKIN; SEC LOCALIZED OSTEOARTHROSIS INVOLVING HAND; Osteoarthritis; DEGENERATIVE DISC DISEASE, LUMBOSACRAL SPINE W/RADICULOPATHY; Localized osteoarthrosis, lower leg; Syncope; Hyponatremia; Chronic radicular low back pain; BPH (benign prostatic hyperplasia); Leg edema, right; Arthritis of right knee; Right wrist pain; Chronic left SI joint pain; Pain due to onychomycosis of toenails of both feet; Strain of left piriformis muscle; Greater trochanteric bursitis of left hip; Trigger finger, left ring finger; Arthritis of right ankle; Callus; Tibia/fibula fracture, right, closed, initial encounter; Chronic heel pain, right; Trigger finger, right middle finger; Acute respiratory failure with hypoxia (HCC); CHF exacerbation (HCC);  Normocytic anemia; Elevated troponin/likely NSTEMI; and Acute exacerbation of CHF (congestive heart failure) (HCC) on their problem list.  Significant Hospital Events: Including procedures, antibiotic start and stop dates in addition to other pertinent events   10/29 admit.  Interim History / Subjective:  Feels better, no chest pain currently. Denies dyspnea.  Objective:  Blood pressure (!) 91/59, pulse (!) 48, temperature (!) 97.5 F (36.4 C), temperature source Oral, resp. rate 12, weight 89.4 kg, SpO2 95%.       No intake or output data in the 24 hours ending 01/21/23 2050 Filed Weights   01/21/23 1630  Weight: 89.4 kg    Examination: General: Adult male, resting in bed, in NAD. Neuro: A&O x 3, no deficits. HEENT: [place]/AT. Sclerae anicteric. EOMI. Cardiovascular: [place] Foley, regular, no M/R/G.  Lungs: Respirations even and unlabored.  CTA bilaterally, No W/R/R. Abdomen: BS x 4, soft, NT/ND.  Musculoskeletal: No gross deformities, 1+ edema.  Skin: Intact, warm, no rashes.  Labs/imaging personally reviewed:  CXR 10/29 > edema, small effusions.  Assessment & Plan:   CHF exacerbation - last echo from Sept 2024 with EV 20-25% with WMA, G1DD, mod MR, mod to severe TR. Sinus bradycardia with bouts of paroxysmal slow A.flutter. Concern for ischemic cardiomyopathy - pt has declined workup/catch in the past. Hx HTN, HLD. - Cardiology consulted and managing. - Diuresis per cards. - Holding PTA Amiodarone for concern toxicity. - Hold PTA Benazepril, Toprol-XL, Torsemide. - Continue PTA Clopidogrel, Ezetimibe.  Acute pulmonary edema with small pleural effusions - 2/2 CHF exacerbation. - Diuresis per cards.  Hypotension - suspect cardiogenic with underlying cardiogenic shock physiology. - Continue NE infusion to facilitate diuresis. - Cards following, consider getting advanced heart failure to see though likely not a candidate for any support and he confirms DNR status in the event of  decompensation.  Chest pain - resolved. - Per cardiology.  AKI - exacerbated by PTA ACEi. Hyperkalemia. - Continue NE to promote renal perfusion. - Diuresis per cards. - Lokelma x 1. - Holding Benazepril, Torsemide. - Follow BMP.  Mild transaminitis - concern low flow state. - NE as above. - Holding PTA Amio as above.  ? Hypothyroidism - TSH significantly elevated at 51. Of note, he was on Amiodarone, question Amio toxicity. Previous TSH in Sept 2024 was 4.7. - F/u on T3, T4. - Holding PTA Amio as above.  Goals of care. - DNR/DNI form on hand with pt and was also confirmed verbally (ok with vasopressor support).  Pt likely better served on cardiology service. Sent a message to cards APP but likely off service by now. Would have day team reach out to covering cards provider for consideration of transfer of primary service. Not much for PCCM to manage currently.  Best practice (evaluated daily):  Diet/type: Regular consistency (see orders) DVT prophylaxis: prophylactic heparin  GI prophylaxis: H2B Lines: N/A Foley:  N/A Code Status:  DNR Last date of multidisciplinary goals of care discussion: None yet.  Labs   CBC: Recent Labs  Lab 01/21/23 1704  WBC 6.0  NEUTROABS 4.7  HGB 10.6*  HCT 33.8*  MCV 97.4  PLT 207    Basic Metabolic Panel: Recent Labs  Lab 01/21/23 1704  NA 134*  K 5.8*  CL 104  CO2 20*  GLUCOSE 121*  BUN 77*  CREATININE 2.28*  CALCIUM 8.3*  MG 3.6*   GFR: Estimated Creatinine Clearance: 24.8 mL/min (A) (by C-G formula based on SCr of 2.28 mg/dL (H)). Recent Labs  Lab 01/21/23 1704  WBC 6.0    Liver Function Tests: Recent Labs  Lab 01/21/23 1704  AST 58*  ALT 62*  ALKPHOS 112  BILITOT 0.5  PROT 6.5  ALBUMIN 3.0*   No results for input(s): "LIPASE", "AMYLASE" in the last 168 hours. No results for input(s): "AMMONIA" in the last 168 hours.  ABG    Component Value Date/Time   HCO3 23.2 12/03/2022 1358   TCO2 24 12/03/2022  1358   ACIDBASEDEF 1.0 12/03/2022 1358   O2SAT 79 12/03/2022 1358     Coagulation Profile: No results for input(s): "INR", "PROTIME" in the last 168 hours.  Cardiac Enzymes: No results for input(s): "CKTOTAL", "CKMB", "CKMBINDEX", "TROPONINI" in the last 168 hours.  HbA1C: Hgb A1c MFr Bld  Date/Time Value Ref Range Status  12/04/2022 03:31 AM 5.7 (H) 4.8 - 5.6 % Final    Comment:    (NOTE)         Prediabetes: 5.7 - 6.4         Diabetes: >6.4         Glycemic control for adults with diabetes: <7.0   10/07/2022 10:02 AM 6.0 4.6 - 6.5 % Final    Comment:    Glycemic Control Guidelines for People with Diabetes:Non Diabetic:  <6%Goal of Therapy: <7%Additional Action Suggested:  >8%     CBG: No results for input(s): "GLUCAP" in the last 168 hours.  Review of Systems:   All negative; except for those that are bolded, which indicate positives.  Constitutional: weight loss, weight gain, night sweats, fevers, chills, fatigue, weakness.  HEENT: headaches, sore throat, sneezing, nasal congestion, post nasal drip, difficulty swallowing, tooth/dental problems, visual complaints, visual changes, ear aches. Neuro: difficulty with speech, weakness, numbness, ataxia. CV:  chest pain (currently resolved), orthopnea, PND, swelling in lower extremities, dizziness, palpitations, syncope.  Resp:  cough, hemoptysis, slight dyspnea (currently denies), wheezing. GI: heartburn, indigestion, abdominal pain, nausea, vomiting, diarrhea, constipation, change in bowel habits, loss of appetite, hematemesis, melena, hematochezia.  GU: dysuria, change in color of urine, urgency or frequency, flank pain, hematuria. MSK: joint pain or swelling, decreased range of motion. Psych: change in mood or affect, depression, anxiety, suicidal ideations, homicidal ideations. Skin: rash, itching, bruising.   Past Medical History:  He,  has a past medical history of Arthritis, DUPUYTREN'S CONTRACTURE, RIGHT (07/04/2009),  GERD (gastroesophageal reflux disease), H/O hiatal hernia, Hyperlipidemia, Hypertension, Knee pain, Neuropathy, Osteoporosis, PMR (polymyalgia rheumatica) (HCC), and Polymyalgia (HCC) (5 YEARS).   Surgical History:   Past Surgical History:  Procedure Laterality Date   CARPAL TUNNEL RELEASE     CATARACT EXTRACTION     right   COLONOSCOPY  2007   INGUINAL HERNIA REPAIR  10/14/2011   Procedure: HERNIA REPAIR INGUINAL ADULT;  Surgeon: Shelly Rubenstein, MD;  Location: [facility] OR;  Service: General;  Laterality: Right;  Right Inguinal Hernia Repair with Mesh   TIBIA IM NAIL INSERTION Right 05/22/2021   Procedure: INTRAMEDULLARY (IM) NAIL TIBIAL;  Surgeon: Myrene Galas, MD;  Location: [facility] OR;  Service: Orthopedics;  Laterality: Right;   TONSILLECTOMY     VARICOSE VEIN SURGERY       Social History:   reports that he has never smoked. He has never used smokeless tobacco. He reports that he does not currently use alcohol after a past usage of about 3.0 standard drinks of alcohol per week. He reports that he does not use drugs.   Family History:  His family history includes Coronary artery disease in an other family member; Heart disease in his father.   Allergies Allergies  Allergen Reactions   Statins Other (See Comments)    Muscle pain and upset stomach  Other Reaction(s): leg pain, stoamch     Home Medications  Prior to Admission medications   Medication Sig Start Date End Date Taking? Authorizing Provider  amiodarone (PACERONE) 200 MG tablet Take 1 tablet (200 mg total) by mouth 2 (two) times daily for 5 days, THEN 1 tablet (200 mg total) daily. 12/06/22 03/06/23 Yes Almon Hercules, MD  benazepril (LOTENSIN) 5 MG tablet Take 1 tablet (5 mg total) by mouth daily. 12/07/22  Yes Almon Hercules, MD  clopidogrel (PLAVIX) 75 MG tablet Take 1 tablet (75 mg total) by mouth daily. 12/06/22  Yes Almon Hercules, MD  ezetimibe (ZETIA) 10 MG tablet Take 1 tablet (10 mg total) by mouth daily. 12/07/22  Yes  Almon Hercules, MD  famotidine (PEPCID) 20 MG tablet TAKE 1 TABLET AT BEDTIME 11/11/22  Yes Burchette, Elberta Fortis, MD  finasteride (PROSCAR) 5 MG tablet TAKE ONE TABLET BY MOUTH ONCE DAILY 01/15/23  Yes Burchette, Elberta Fortis, MD  gabapentin (NEURONTIN) 100 MG capsule Take 2 capsules in AM and 2 in PM 01/17/23  Yes Burchette, Elberta Fortis, MD  metoprolol succinate (TOPROL-XL) 25 MG 24 hr tablet Take 1 tablet (25 mg total) by mouth daily. 12/07/22  Yes Almon Hercules, MD  polyethylene glycol (MIRALAX / GLYCOLAX) packet Take 17 g by mouth daily as needed for moderate constipation. Patient taking differently: Take 17 g by mouth 2 (two) times daily. 03/16/17  Yes Glade Lloyd, MD  torsemide (DEMADEX) 20 MG tablet Take 1 tablet (20 mg total) by mouth daily. 12/06/22 03/06/23 Yes Almon Hercules, MD     Critical care time: 35 min.   Rutherford Guys, [place] -  [facility] For pager details, please see AMION or use Epic chat  After 1900, please call [facility] for cross coverage needs 01/21/2023, 8:50 PM

## 2023-01-21 NOTE — Progress Notes (Signed)
An USGPIV (ultrasound guided PIV) has been placed for short-term vasopressor infusion. A correctly placed ivWatch must be used when administering Vasopressors. Should this treatment be needed beyond 24 hours, central line access should be obtained.  It will be the responsibility of the bedside nurse to follow best practice to prevent extravasations.

## 2023-01-21 NOTE — ED Provider Notes (Signed)
[place] EMERGENCY DEPARTMENT AT [facility] Provider Note   CSN: [id number] Arrival date & time: 01/21/23  1626     History  Chief Complaint  Patient presents with   Chest Pain    Darin [name] is a 89 y.o. male.   Chest Pain Associated symptoms: diaphoresis, fatigue and nausea   Patient presents for chest pain.  Medical history includes neuropathy, GERD, arthritis, BPH, CHF, anemia, CAD.  6 weeks ago, he was hospitalized for NSTEMI and CHF exacerbation.  Echocardiogram showed LVEF of 20 to 25%.  He declined heart cath.  He was treated with Lasix, heparin.  He was started on amiodarone.  Yesterday he had some fatigue.  This morning, at 6 AM, he had onset of a dull chest pain/pressure.  He did take his morning medications.  Pain persisted throughout the day.  He also reports nausea and diaphoresis.  EMS was called this afternoon.  EMS noted soft blood pressures and bradycardia.  They did give 324 ASA prior to arrival.  No other interventions were given.  Chest pain has mildly improved.  Current chest pain is 5/10 in severity.     Home Medications Prior to Admission medications   Medication Sig Start Date End Date Taking? Authorizing Provider  amiodarone (PACERONE) 200 MG tablet Take 1 tablet (200 mg total) by mouth 2 (two) times daily for 5 days, THEN 1 tablet (200 mg total) daily. 12/06/22 03/06/23 Yes Almon Hercules, MD  benazepril (LOTENSIN) 5 MG tablet Take 1 tablet (5 mg total) by mouth daily. 12/07/22  Yes Almon Hercules, MD  clopidogrel (PLAVIX) 75 MG tablet Take 1 tablet (75 mg total) by mouth daily. 12/06/22  Yes Almon Hercules, MD  ezetimibe (ZETIA) 10 MG tablet Take 1 tablet (10 mg total) by mouth daily. 12/07/22  Yes Almon Hercules, MD  famotidine (PEPCID) 20 MG tablet TAKE 1 TABLET AT BEDTIME 11/11/22  Yes Burchette, Elberta Fortis, MD  finasteride (PROSCAR) 5 MG tablet TAKE ONE TABLET BY MOUTH ONCE DAILY 01/15/23  Yes Burchette, Elberta Fortis, MD  gabapentin (NEURONTIN) 100 MG  capsule Take 2 capsules in AM and 2 in PM 01/17/23  Yes Burchette, Elberta Fortis, MD  metoprolol succinate (TOPROL-XL) 25 MG 24 hr tablet Take 1 tablet (25 mg total) by mouth daily. 12/07/22  Yes Almon Hercules, MD  polyethylene glycol (MIRALAX / GLYCOLAX) packet Take 17 g by mouth daily as needed for moderate constipation. Patient taking differently: Take 17 g by mouth 2 (two) times daily. 03/16/17  Yes Glade Lloyd, MD  torsemide (DEMADEX) 20 MG tablet Take 1 tablet (20 mg total) by mouth daily. 12/06/22 03/06/23 Yes Almon Hercules, MD      Allergies    Statins    Review of Systems   Review of Systems  Constitutional:  Positive for diaphoresis and fatigue.  Cardiovascular:  Positive for chest pain.  Gastrointestinal:  Positive for nausea.  All other systems reviewed and are negative.   Physical Exam Updated Vital Signs BP (!) 91/59   Pulse (!) 48   Temp (!) 97.5 F (36.4 C) (Oral)   Resp 12   Wt 89.4 kg   SpO2 95%   BMI 25.99 kg/m  Physical Exam Vitals and nursing note reviewed.  Constitutional:      General: He is not in acute distress.    Appearance: He is well-developed. He is not ill-appearing, toxic-appearing or diaphoretic.  HENT:     Head: Normocephalic and atraumatic.  Eyes:     Extraocular Movements: Extraocular movements intact.     Conjunctiva/sclera: Conjunctivae normal.  Cardiovascular:     Rate and Rhythm: Bradycardia present. Rhythm irregular.     Heart sounds: Normal heart sounds. No murmur heard. Pulmonary:     Effort: Pulmonary effort is normal. No tachypnea or respiratory distress.     Breath sounds: Normal breath sounds.  Chest:     Chest wall: No tenderness.  Abdominal:     Palpations: Abdomen is soft.     Tenderness: There is no abdominal tenderness.  Musculoskeletal:        General: No swelling.     Cervical back: Normal range of motion and neck supple.     Right lower leg: Edema present.     Left lower leg: Edema present.  Skin:    General:  Skin is warm and dry.  Neurological:     General: No focal deficit present.     Mental Status: He is alert and oriented to person, place, and time.  Psychiatric:        Mood and Affect: Mood normal.        Behavior: Behavior normal.     ED Results / Procedures / Treatments   Labs (all labs ordered are listed, but only abnormal results are displayed) Labs Reviewed  BASIC METABOLIC PANEL - Abnormal; Notable for the following components:      Result Value   Sodium 134 (*)    Potassium 5.8 (*)    CO2 20 (*)    Glucose, Bld 121 (*)    BUN 77 (*)    Creatinine, Ser 2.28 (*)    Calcium 8.3 (*)    GFR, Estimated 27 (*)    All other components within normal limits  MAGNESIUM - Abnormal; Notable for the following components:   Magnesium 3.6 (*)    All other components within normal limits  HEPATIC FUNCTION PANEL - Abnormal; Notable for the following components:   Albumin 3.0 (*)    AST 58 (*)    ALT 62 (*)    All other components within normal limits  BRAIN NATRIURETIC PEPTIDE - Abnormal; Notable for the following components:   B Natriuretic Peptide 604.5 (*)    All other components within normal limits  CBC WITH DIFFERENTIAL/PLATELET - Abnormal; Notable for the following components:   RBC 3.47 (*)    Hemoglobin 10.6 (*)    HCT 33.8 (*)    Lymphs Abs 0.6 (*)    All other components within normal limits  TSH - Abnormal; Notable for the following components:   TSH 51.031 (*)    All other components within normal limits  URINALYSIS, ROUTINE W REFLEX MICROSCOPIC  T3, FREE  T4, FREE  CK  CBG MONITORING, ED  TROPONIN I (HIGH SENSITIVITY)  TROPONIN I (HIGH SENSITIVITY)    EKG None  Radiology DG Chest Portable 1 View  Result Date: 01/21/2023 CLINICAL DATA:  Chest pain EXAM: PORTABLE CHEST 1 VIEW COMPARISON:  Chest x-ray 12/05/2022. FINDINGS: There small pleural effusions, unchanged. Cardiomegaly and central pulmonary vascular congestion are again noted. There is no  pneumothorax. No acute fractures are seen. IMPRESSION: 1. Cardiomegaly and central pulmonary vascular congestion. 2. Small pleural effusions, unchanged. Electronically Signed   By: Darliss Cheney M.D.   On: 01/21/2023 20:28    Procedures Procedures    Medications Ordered in ED Medications  0.9 %  sodium chloride infusion (250 mLs Intravenous New Bag/Given 01/21/23 2017)  norepinephrine (LEVOPHED)  4mg  in (0.016 mg/mL) premix infusion (2 mcg/min Intravenous New Bag/Given 01/21/23 2014)  fentaNYL (SUBLIMAZE) injection 25 mcg (25 mcg Intravenous Given 01/21/23 1801)  ondansetron (ZOFRAN) injection 4 mg (4 mg Intravenous Given 01/21/23 1801)  levothyroxine (SYNTHROID) tablet 25 mcg (25 mcg Oral Given 01/21/23 2023)  hydrocortisone sodium succinate (SOLU-CORTEF) 100 MG injection 50 mg (50 mg Intravenous Given 01/21/23 2002)  furosemide (LASIX) injection 80 mg (80 mg Intravenous Given 01/21/23 2020)    ED Course/ Medical Decision Making/ A&P                                 Medical Decision Making Amount and/or Complexity of Data Reviewed Labs: ordered. Radiology: ordered.  Risk Prescription drug management. Decision regarding hospitalization.   This patient presents to the ED for concern of chest pain, this involves an extensive number of treatment options, and is a complaint that carries with it a high risk of complications and morbidity.  The differential diagnosis includes ACS, pneumonia, pericarditis, CHF   Co morbidities that complicate the patient evaluation  neuropathy, GERD, arthritis, BPH, CHF, anemia, CAD   Additional history obtained:  Additional history obtained from EMS External records from outside source obtained and reviewed including EMR   Lab Tests:  I Ordered, and personally interpreted labs.  The pertinent results include: AKI is present.  Hyperkalemia is present.  BNP is elevated.  Troponin is normal.  TSH is markedly elevated.  No leukocytosis is  present.   Imaging Studies ordered:  I ordered imaging studies including chest x-ray I independently visualized and interpreted imaging which showed pulmonary vascular congestion and bilateral pleural effusions I agree with the radiologist interpretation   Cardiac Monitoring: / EKG:  The patient was maintained on a cardiac monitor.  I personally viewed and interpreted the cardiac monitored which showed an underlying rhythm of: Likely atrial flutter with slow ventricular response   Consultations Obtained:  I requested consultation with the cardiologist, Dr. Jacques Navy,  and discussed lab and imaging findings as well as pertinent plan - they recommend: Diuresis for treatment of cardiorenal syndrome, Levophed to assist with diuresis.  Cardiology will continue to see in consult.   Problem List / ED Course / Critical interventions / Medication management  Patient presenting for persistent chest pressure throughout the day in addition to nausea and diaphoresis.  On arrival in the ED, he is alert and oriented.  He endorses ongoing 5/10 severity chest discomfort.  Vital signs are notable for bradycardia and soft blood pressure.  On EKG, he does appear to be a atrial flutter rhythm with a slow ventricular response.  He does take metoprolol.  Patient was also started on amiodarone and torsemide last month.  His cardiologist while in the hospital was Dr. Algie Coffer.  Patient reports that he planned to follow-up with Dr. Royann Shivers but has not yet.  Lab work was notable for acute renal failure, hyperkalemia, elevated BNP, and markedly elevated TSH.  T3 and T4 studies were ordered.  I am concerned of amiodarone induced thyroid dysfunction.  He was given low-dose Synthroid as well as Solu-Cortef.  I spoke with cardiology who suspects cardiorenal syndrome.  They recommended diuresis.  80 mg of IV Lasix was ordered.  Cardiology came and evaluated the patient.  They feel that he would benefit from initiation of  Levophed while he is being diuresed.  Patient was admitted to ICU for further management. I ordered medication including  Zofran for nausea; fentanyl for analgesia; Synthroid and Solu-Cortef for suspected hypothyroidism; Lasix for diuresis and hyperkalemia; Levophed for hypotension Reevaluation of the patient after these medicines showed that the patient improved I have reviewed the patients home medicines and have made adjustments as needed   Social Determinants of Health:  Lives independently, discharged last month on hospice  CRITICAL CARE Performed by: Gloris Manchester   Total critical care time: 34 minutes  Critical care time was exclusive of separately billable procedures and treating other patients.  Critical care was necessary to treat or prevent imminent or life-threatening deterioration.  Critical care was time spent personally by me on the following activities: development of treatment plan with patient and/or surrogate as well as nursing, discussions with consultants, evaluation of patient's response to treatment, examination of patient, obtaining history from patient or surrogate, ordering and performing treatments and interventions, ordering and review of laboratory studies, ordering and review of radiographic studies, pulse oximetry and re-evaluation of patient's condition.         Final Clinical Impression(s) / ED Diagnoses Final diagnoses:  Acute renal failure, unspecified acute renal failure type (HCC)  Hypotension, unspecified hypotension type  Hyperkalemia    Rx / DC Orders ED Discharge Orders     None         Gloris Manchester, MD 01/21/23 2049

## 2023-01-21 NOTE — ED Triage Notes (Addendum)
BIB [facility], c/o of chest pain that started this morning at 0600, dull pain in the middle of the chest with no radiation, pt has a history of MI 4 weeks ago, pt was clammy and pale on scene Pain 6/10 Aspirin 324 mg given by EMS

## 2023-01-22 ENCOUNTER — Encounter (HOSPITAL_COMMUNITY): Payer: Self-pay | Admitting: Pulmonary Disease

## 2023-01-22 ENCOUNTER — Inpatient Hospital Stay (HOSPITAL_COMMUNITY): Payer: Medicare Other

## 2023-01-22 DIAGNOSIS — N179 Acute kidney failure, unspecified: Secondary | ICD-10-CM | POA: Diagnosis not present

## 2023-01-22 DIAGNOSIS — I5023 Acute on chronic systolic (congestive) heart failure: Secondary | ICD-10-CM

## 2023-01-22 DIAGNOSIS — Z66 Do not resuscitate: Secondary | ICD-10-CM

## 2023-01-22 DIAGNOSIS — E875 Hyperkalemia: Secondary | ICD-10-CM

## 2023-01-22 DIAGNOSIS — I959 Hypotension, unspecified: Secondary | ICD-10-CM

## 2023-01-22 DIAGNOSIS — I5043 Acute on chronic combined systolic (congestive) and diastolic (congestive) heart failure: Secondary | ICD-10-CM | POA: Diagnosis not present

## 2023-01-22 DIAGNOSIS — Z7189 Other specified counseling: Secondary | ICD-10-CM

## 2023-01-22 DIAGNOSIS — Z515 Encounter for palliative care: Secondary | ICD-10-CM

## 2023-01-22 LAB — CBC
HCT: 33.8 % — ABNORMAL LOW (ref 39.0–52.0)
Hemoglobin: 10.8 g/dL — ABNORMAL LOW (ref 13.0–17.0)
MCH: 30.8 pg (ref 26.0–34.0)
MCHC: 32 g/dL (ref 30.0–36.0)
MCV: 96.3 fL (ref 80.0–100.0)
Platelets: 184 10*3/uL (ref 150–400)
RBC: 3.51 MIL/uL — ABNORMAL LOW (ref 4.22–5.81)
RDW: 14.4 % (ref 11.5–15.5)
WBC: 5 10*3/uL (ref 4.0–10.5)
nRBC: 0 % (ref 0.0–0.2)

## 2023-01-22 LAB — BASIC METABOLIC PANEL
Anion gap: 7 (ref 5–15)
Anion gap: 13 (ref 5–15)
BUN: 75 mg/dL — ABNORMAL HIGH (ref 8–23)
BUN: 67 mg/dL — ABNORMAL HIGH (ref 8–23)
CO2: 22 mmol/L (ref 22–32)
CO2: 21 mmol/L — ABNORMAL LOW (ref 22–32)
Calcium: 8.3 mg/dL — ABNORMAL LOW (ref 8.9–10.3)
Calcium: 8.6 mg/dL — ABNORMAL LOW (ref 8.9–10.3)
Chloride: 102 mmol/L (ref 98–111)
Chloride: 101 mmol/L (ref 98–111)
Creatinine, Ser: 2.17 mg/dL — ABNORMAL HIGH (ref 0.61–1.24)
Creatinine, Ser: 2.1 mg/dL — ABNORMAL HIGH (ref 0.61–1.24)
GFR, Estimated: 28 mL/min — ABNORMAL LOW (ref >60)
GFR, Estimated: 30 mL/min — ABNORMAL LOW (ref >60)
Glucose, Bld: 132 mg/dL — ABNORMAL HIGH (ref 70–99)
Glucose, Bld: 103 mg/dL — ABNORMAL HIGH (ref 70–99)
Potassium: 5.4 mmol/L — ABNORMAL HIGH (ref 3.5–5.1)
Potassium: 4.1 mmol/L (ref 3.5–5.1)
Sodium: 131 mmol/L — ABNORMAL LOW (ref 135–145)
Sodium: 135 mmol/L (ref 135–145)

## 2023-01-22 LAB — URINALYSIS, ROUTINE W REFLEX MICROSCOPIC
Bilirubin Urine: NEGATIVE
Glucose, UA: NEGATIVE mg/dL
Hgb urine dipstick: NEGATIVE
Ketones, ur: NEGATIVE mg/dL
Nitrite: POSITIVE — AB
Protein, ur: NEGATIVE mg/dL
Specific Gravity, Urine: 1.012 (ref 1.005–1.030)
pH: 6 (ref 5.0–8.0)

## 2023-01-22 LAB — MAGNESIUM: Magnesium: 3.6 mg/dL — ABNORMAL HIGH (ref 1.7–2.4)

## 2023-01-22 LAB — PHOSPHORUS: Phosphorus: 5.2 mg/dL — ABNORMAL HIGH (ref 2.5–4.6)

## 2023-01-22 MED ORDER — MIDODRINE HCL 5 MG PO TABS
10.0000 mg | ORAL_TABLET | Freq: Three times a day (TID) | ORAL | Status: DC
  Administered 2023-01-22 – 2023-01-24 (×8): 10 mg via ORAL
  Filled 2023-01-22 (×9): qty 2

## 2023-01-22 MED ORDER — CHLORHEXIDINE GLUCONATE CLOTH 2 % EX PADS
6.0000 | MEDICATED_PAD | Freq: Every day | CUTANEOUS | Status: DC
  Administered 2023-01-21 – 2023-01-24 (×3): 6 via TOPICAL

## 2023-01-22 MED ORDER — FUROSEMIDE 10 MG/ML IJ SOLN
80.0000 mg | Freq: Once | INTRAMUSCULAR | Status: AC
  Administered 2023-01-22: 80 mg via INTRAVENOUS
  Filled 2023-01-22: qty 8

## 2023-01-22 MED ORDER — SODIUM ZIRCONIUM CYCLOSILICATE 10 G PO PACK
10.0000 g | PACK | Freq: Once | ORAL | Status: AC
  Administered 2023-01-22: 10 g via ORAL
  Filled 2023-01-22: qty 1

## 2023-01-22 MED ORDER — SODIUM CHLORIDE 0.9 % IV SOLN
2.0000 g | INTRAVENOUS | Status: DC
  Administered 2023-01-22: 2 g via INTRAVENOUS
  Filled 2023-01-22: qty 20

## 2023-01-22 MED ORDER — FUROSEMIDE 10 MG/ML IJ SOLN
80.0000 mg | Freq: Two times a day (BID) | INTRAMUSCULAR | Status: DC
  Administered 2023-01-22 – 2023-01-24 (×5): 80 mg via INTRAVENOUS
  Filled 2023-01-22 (×6): qty 8

## 2023-01-22 NOTE — Progress Notes (Addendum)
Patient Name: Darin [name] Date of Encounter: 01/22/2023 [facility] Cardiologist: None Previously Dr. Algie Coffer --> wants to transition to Heart Care Dr. Royann Shivers   Interval Summary  .    States that his shortness of breath has improved some.  Still remains to be hypotensive requiring Levophed 15/h and still significantly volume up.  Vital Signs .    Vitals:   01/22/23 1015 01/22/23 1019 01/22/23 1030 01/22/23 1045  BP: (!) 83/43 (!) 90/48 (!) 112/48 (!) 104/50  Pulse: (!) 57 (!) 56 (!) 56 (!) 58  Resp: (!) 9 20 13 16   Temp:      TempSrc:      SpO2: 94% 96% 97% 96%  Weight:      Height:        Intake/Output Summary (Last 24 hours) at 01/22/2023 1057 Last data filed at 01/22/2023 1000 Gross per 24 hour  Intake 827.52 ml  Output 900 ml  Net -72.48 ml      01/22/2023    3:16 AM 01/21/2023    9:37 PM 01/21/2023    4:30 PM  Last 3 Weights  Weight (lbs) 201 lb 11.5 oz 201 lb 11.5 oz 197 lb  Weight (kg) 91.5 kg 91.5 kg 89.359 kg      Telemetry/ECG    Atrial fibrillation heart rates in the 50s- Personally Reviewed  CV Studies    Echocardiogram 12/04/2022 1. Left ventricular ejection fraction, by estimation, is 20 to 25%. The  left ventricle has severely decreased function. The left ventricle  demonstrates regional wall motion abnormalities (see scoring  diagram/findings for description). Left ventricular  diastolic parameters are consistent with Grade I diastolic dysfunction  (impaired relaxation).   2. Right ventricular systolic function is mildly reduced. The right  ventricular size is normal. There is moderately elevated pulmonary artery  systolic pressure.   3. Left atrial size was mildly dilated.   4. Right atrial size was moderately dilated.   5. The mitral valve is degenerative. Moderate mitral valve regurgitation.   6. Tricuspid valve regurgitation is moderate to severe.   7. The aortic valve is tricuspid. There is moderate calcification of  the  aortic valve. There is mild thickening of the aortic valve. Aortic valve  regurgitation is mild. Aortic valve sclerosis/calcification is present,  without any evidence of aortic  stenosis.   8. The inferior vena cava is dilated in size with <50% respiratory  variability, suggesting right atrial pressure of 15 mmHg.   Conclusion(s)/Recommendation(s): Findings consistent with ischemic  cardiomyopathy.   Physical Exam .   GEN: No acute distress.   Neck: + JVD Cardiac: Bradycardic, irregularly irregular  respiratory: Clear to auscultation bilaterally. GI: Soft, nontender, non-distended  MS: 3+ edema  Patient Profile    Darin [name] is a 89 y.o. male has hx of probable ischemic cardiomyopathy, chronic HFrEF, hypertension, hyperlipidemia, polymyalgia rheumatica and admitted on 01/21/2019 for for the evaluation of CHF exacerbation and possible amiodarone toxicity  Assessment & Plan .     CAD Previously seen in September 2024 for NSTEMI with evidence of ischemic cardiomyopathy.  Had deferred cardiac catheterization at that time due to what seems like a lack of understanding of procedure as well as incompatibility with cardiologist.  CT chest here shows multivessel coronary calcifications. Discussed with patient more in depth and he would be willing to proceed with cardiac catheterization.  However he does have advanced age with a DNR so may opt for more conservative approaches depending on GOC.  Regardless  would not be a candidate right now with decompensated heart failure with AKI  Continue Plavix, holding statin due to elevated LFTs.  Holding beta-blocker due to bradycardia.  Persistent atrial fibrillation/flutter with slow ventricular rate Was started on amiodarone September 2024.  There is not much documentation of this and he had not been on anticoagulation for unknown reasons.  Concern for amiodarone toxicity given bradycardia and elevated TSH and LFTs.  Heart rates in the  50s Continue to hold Toprol-XL, amio has been stopped.  In regards to anticoagulation, need to outline goals of care and if this is reasonable to start.  Acute on chronic HFrEF 20-25% and cardiogenic shock Ischemic cardiomyopathy Moderate MR Moderate to severe TR Echocardiogram from September 2024 shows EF 20 to 25% with regional wall motion abnormalities concerning for ischemic cardiomyopathy.  Had mildly reduced RV function and moderately elevated PASP.  He is hypotensive requiring Levophed and has elevated LFTs and AKI due to low output. Being diuresed on IV Lasix 80 mg with subpar output. -900 in the last 24 hrs. Discussed with Dr. Anne Fu and given DNR and overall approach for more conservative therapies we will try to wean off Levophed and transition him midodrine 10 mg 3 times daily and continue with diuresis.  At baseline patient reports having low to normal blood pressure.  Holding all home antihypertensives.  Transitioning Levophed to midodrine milligrams 3 times daily Continue IV Lasix 80 mg twice daily  Goals of care/DNR Recommend palliative care consultation to further define how aggressive he wants to be given his advanced heart failure and previous declination of cardiac catheterization.  For questions or updates, please contact [facility] Please consult [email or web address] for contact info under        Signed, Abagail Kitchens, PA-C    Personally seen and examined. Agree with above.  Discussed with critical care team, nursing team.  89 year old male who recently suffered non-ST elevation myocardial infarction which was medically managed.  Has ischemic cardiomyopathy with ejection fraction of 20% and presumed low output.  He was admitted with nausea, diaphoresis.  Heart rates were 46 blood pressure was 96/52.  He does state that at home his blood pressure usually in the low 100 range.  At home was taking benazepril Toprol 25 mg a day and 20 mg of torsemide a  day.  Creatinine was up to 2.2 BUN 77, ALT mildly elevated at 62.  TSH was elevated at 51 however free T4 was 0.74 within normal range.  Currently comfortable laying in bed.  Mild lower extremity edema bilaterally.  Conversant.  Retired.  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  Has 3 stepchildren.  No children of his own.  His stepchildren live in [place], [place], [place].  He lives alone.  He expressed to me that when it is his time he would like to peacefully passed away at home in his bed.  His original hospitalization for myocardial infarction was on 12/03/2022.  At that time he did not desire to go forward with cardiac catheterization.  His EKG shows atrial flutter 47, 4:1 conduction.  I would like to try to stop the norepinephrine and place him on midodrine 10 mg TID for further blood pressure support.  Remember, his blood pressure normally is fairly low at home in the low 100s.  With his recent ejection fraction of 20% and his  current constellation of symptoms, this could be related to low output heart failure with concomitant hepatic congestion as well as acute kidney injury.  In addition, his bradycardia with heart rate of 47 bpm is not helping his cardiac output.  We have stopped his Toprol as well as amiodarone.  We will administer Lasix 80 mg IV.  He is not a candidate for advanced therapeutics such as home inotropes, LVAD.  We will have the palliative care team come to help with goals of care.  CRITICAL CARE Performed by: Donato Schultz   Total critical care time: 50 minutes  Critical care time was exclusive of separately billable procedures and treating other patients.  Critical care was necessary to treat or prevent imminent or life-threatening deterioration.  Critical care was time spent personally by me on the following  activities: development of treatment plan with patient and/or surrogate as well as nursing, discussions with consultants, evaluation of patient's response to treatment, examination of patient, obtaining history from patient or surrogate, ordering and performing treatments and interventions, ordering and review of laboratory studies, ordering and review of radiographic studies, pulse oximetry and re-evaluation of patient's condition.   Donato Schultz, MD

## 2023-01-22 NOTE — Progress Notes (Addendum)
NAME:  Darin [name], MRN:  [id number], DOB:  [birth year], LOS: 1 ADMISSION DATE:  01/21/2023, CONSULTATION DATE:  01/21/23 REFERRING MD:  Jacques Navy CHIEF COMPLAINT:  Volume Overload   History of Present Illness:  Darin [name] is a 89 y.o. male who has a PMH as below including but not limited to chronic combined CHF (last echo Sept 2024 with EV 20-25% with WMA, G1DD, mod MR, mod to severe TR), HTN, HLD, PMR. He presented to [facility] ED 10/29 with chest pain, nausea, diaphoresis. Workup in ED suggestive of CHF exacerbation, pulmonary edema, AKI, hypotension, bradycardia. EKG showed sinus bradycardia. He does take Benazepril, Toprol-XL, Torsemide at home and took all meds earlier on day of presentation.  He was evaluated by cardiology who felt he needed diuresis; however, he has soft pressures; therefore, was started on NE infusion. PCCM consulted for admission.  Pertinent  Medical History:  has CARCINOMA, BASAL CELL; Hyperlipidemia; POLYNEUROPATHY OTHER DISEASES CLASSIFIED ELSW; CATARACT ASSOCIATED WITH OTHER SYNDROMES; CERUMEN IMPACTION, BILATERAL; Essential hypertension; VARICOSE VEINS LOWER EXTREMITIES W/INFLAMMATION; GERD (gastroesophageal reflux disease); CONSTIPATION, DRUG INDUCED; DRY SKIN; SEC LOCALIZED OSTEOARTHROSIS INVOLVING HAND; Osteoarthritis; DEGENERATIVE DISC DISEASE, LUMBOSACRAL SPINE W/RADICULOPATHY; Localized osteoarthrosis, lower leg; Syncope; Hyponatremia; Chronic radicular low back pain; BPH (benign prostatic hyperplasia); Leg edema, right; Arthritis of right knee; Right wrist pain; Chronic left SI joint pain; Pain due to onychomycosis of toenails of both feet; Strain of left piriformis muscle; Greater trochanteric bursitis of left hip; Trigger finger, left ring finger; Arthritis of right ankle; Callus; Tibia/fibula fracture, right, closed, initial encounter; Chronic heel pain, right; Trigger finger, right middle finger; Acute respiratory failure with hypoxia (HCC); CHF exacerbation (HCC);  Normocytic anemia; Elevated troponin/likely NSTEMI; and Acute exacerbation of CHF (congestive heart failure) (HCC) on their problem list.   Significant Hospital Events: Including procedures, antibiotic start and stop dates in addition to other pertinent events   10/29 admit on low dose levo  Interim History / Subjective:  No complaints overnight; states no more chest pain On 2 mcg levo  Objective:  Blood pressure (!) 86/41, pulse 65, temperature 98.7 F (37.1 C), temperature source Oral, resp. rate 14, height 6\' 1"  (1.854 m), weight 91.5 kg, SpO2 96%.        Intake/Output Summary (Last 24 hours) at 01/22/2023 1696 Last data filed at 01/22/2023 0800 Gross per 24 hour  Intake 810.55 ml  Output 900 ml  Net -89.45 ml   Filed Weights   01/21/23 1630 01/21/23 2137 01/22/23 0316  Weight: 89.4 kg 91.5 kg 91.5 kg    Examination: General:  NAD HEENT: MM pink/moist Neuro: Aox3; MAE CV: s1s2, RRR, no m/r/g PULM:  dim clear BS bilaterally GI: soft, bsx4 active  Extremities: warm/dry, trace ble edema  Skin: no rashes or lesions    Labs/imaging personally reviewed:  CXR 10/29 > edema, small effusions.  Assessment & Plan:   Shock: likely cardiogenic Chest pain: resolved; recent NSTEMI on 11/2022 patient deferred heart cath and discharged on plavix CHF exacerbation - last echo from Sept 2024 with EV 20-25% with WMA, G1DD, mod MR, mod to severe TR. Sinus bradycardia with bouts of paroxysmal slow A.flutter. Concern for ischemic cardiomyopathy - pt has declined workup/catch in the past. Hx HTN, HLD. Plan: -cardiology following; appreciate recs -cont levo for map goal >65 -tele monitoring; cont to hold amio w/ concern for toxicity -lasix this am; monitor UOP -Cont clopidogrel and zetia -hold home anti-hypertensives w/ hypotension -recently started on amio 11/2022 for new onset aflutter; consider AC per  cards  Acute pulmonary edema with small pleural effusions - 2/2 CHF  exacerbation. Plan: -currently on room air -lasix this am; monitor UOP -trend cxr  AKI - exacerbated by PTA ACEi. Hyperkalemia Hyponatremia Plan: -lasix and lokelma this am -hold home ace-inhibitor -Trend BMP / urinary output -Replace electrolytes as indicated -Avoid nephrotoxic agents, ensure adequate renal perfusion  Mild transaminitis - concern low flow state. -CT abd/pelvis showing gallbladder wall haziness and thickening; recommending RUQ [place] to rule out acute cholecystitis Plan: -ruq [place] -trend cmp  ? Hypothyroidism - TSH significantly elevated at 51. Of note, he was on Amiodarone, question Amio toxicity. Previous TSH in Sept 2024 was 4.7. -t4 normal Plan: -cont to hold amio  Chronic anemia Plan: -trend cbc  Best practice (evaluated daily):  Diet/type: Regular consistency (see orders) DVT prophylaxis: prophylactic heparin  GI prophylaxis: H2B Lines: N/A Foley:  N/A Code Status:  DNR Last date of multidisciplinary goals of care discussion: 10/29 DNR/DNI form on hand with pt and was also confirmed verbally (ok with vasopressor support; 10/30 updated patient at bedside  Labs   CBC: Recent Labs  Lab 01/21/23 1704 01/22/23 0249  WBC 6.0 5.0  NEUTROABS 4.7  --   HGB 10.6* 10.8*  HCT 33.8* 33.8*  MCV 97.4 96.3  PLT 207 184    Basic Metabolic Panel: Recent Labs  Lab 01/21/23 1704 01/22/23 0249  NA 134* 131*  K 5.8* 5.4*  CL 104 102  CO2 20* 22  GLUCOSE 121* 132*  BUN 77* 75*  CREATININE 2.28* 2.17*  CALCIUM 8.3* 8.3*  MG 3.6* 3.6*  PHOS  --  5.2*   GFR: Estimated Creatinine Clearance: 26.1 mL/min (A) (by C-G formula based on SCr of 2.17 mg/dL (H)). Recent Labs  Lab 01/21/23 1704 01/22/23 0249  WBC 6.0 5.0    Liver Function Tests: Recent Labs  Lab 01/21/23 1704  AST 58*  ALT 62*  ALKPHOS 112  BILITOT 0.5  PROT 6.5  ALBUMIN 3.0*   No results for input(s): "LIPASE", "AMYLASE" in the last 168 hours. No results for input(s): "AMMONIA"  in the last 168 hours.  ABG    Component Value Date/Time   HCO3 23.2 12/03/2022 1358   TCO2 24 12/03/2022 1358   ACIDBASEDEF 1.0 12/03/2022 1358   O2SAT 79 12/03/2022 1358     Coagulation Profile: No results for input(s): "INR", "PROTIME" in the last 168 hours.  Cardiac Enzymes: Recent Labs  Lab 01/21/23 2217  CKTOTAL 73    HbA1C: Hgb A1c MFr Bld  Date/Time Value Ref Range Status  12/04/2022 03:31 AM 5.7 (H) 4.8 - 5.6 % Final    Comment:    (NOTE)         Prediabetes: 5.7 - 6.4         Diabetes: >6.4         Glycemic control for adults with diabetes: <7.0   10/07/2022 10:02 AM 6.0 4.6 - 6.5 % Final    Comment:    Glycemic Control Guidelines for People with Diabetes:Non Diabetic:  <6%Goal of Therapy: <7%Additional Action Suggested:  >8%     CBG: Recent Labs  Lab 01/21/23 2130  GLUCAP 132*    Review of Systems:   All negative; except for those that are bolded, which indicate positives.  Constitutional: weight loss, weight gain, night sweats, fevers, chills, fatigue, weakness.  HEENT: headaches, sore throat, sneezing, nasal congestion, post nasal drip, difficulty swallowing, tooth/dental problems, visual complaints, visual changes, ear aches. Neuro: difficulty with speech,  weakness, numbness, ataxia. CV:  chest pain (currently resolved), orthopnea, PND, swelling in lower extremities, dizziness, palpitations, syncope.  Resp: cough, hemoptysis, slight dyspnea (currently denies), wheezing. GI: heartburn, indigestion, abdominal pain, nausea, vomiting, diarrhea, constipation, change in bowel habits, loss of appetite, hematemesis, melena, hematochezia.  GU: dysuria, change in color of urine, urgency or frequency, flank pain, hematuria. MSK: joint pain or swelling, decreased range of motion. Psych: change in mood or affect, depression, anxiety, suicidal ideations, homicidal ideations. Skin: rash, itching, bruising.   Past Medical History:  He,  has a past medical  history of Arthritis, DUPUYTREN'S CONTRACTURE, RIGHT (07/04/2009), GERD (gastroesophageal reflux disease), H/O hiatal hernia, Hyperlipidemia, Hypertension, Knee pain, Neuropathy, Osteoporosis, PMR (polymyalgia rheumatica) (HCC), and Polymyalgia (HCC) (5 YEARS).   Surgical History:   Past Surgical History:  Procedure Laterality Date   CARPAL TUNNEL RELEASE     CATARACT EXTRACTION     right   COLONOSCOPY  2007   INGUINAL HERNIA REPAIR  10/14/2011   Procedure: HERNIA REPAIR INGUINAL ADULT;  Surgeon: Shelly Rubenstein, MD;  Location: [facility] OR;  Service: General;  Laterality: Right;  Right Inguinal Hernia Repair with Mesh   TIBIA IM NAIL INSERTION Right 05/22/2021   Procedure: INTRAMEDULLARY (IM) NAIL TIBIAL;  Surgeon: Myrene Galas, MD;  Location: [facility] OR;  Service: Orthopedics;  Laterality: Right;   TONSILLECTOMY     VARICOSE VEIN SURGERY       Social History:   reports that he has never smoked. He has never used smokeless tobacco. He reports that he does not currently use alcohol after a past usage of about 3.0 standard drinks of alcohol per week. He reports that he does not use drugs.   Family History:  His family history includes Coronary artery disease in an other family member; Heart disease in his father.   Allergies Allergies  Allergen Reactions   Statins Other (See Comments)    Muscle pain and upset stomach  Other Reaction(s): leg pain, stoamch     Home Medications  Prior to Admission medications   Medication Sig Start Date End Date Taking? Authorizing Provider  amiodarone (PACERONE) 200 MG tablet Take 1 tablet (200 mg total) by mouth 2 (two) times daily for 5 days, THEN 1 tablet (200 mg total) daily. 12/06/22 03/06/23 Yes Almon Hercules, MD  benazepril (LOTENSIN) 5 MG tablet Take 1 tablet (5 mg total) by mouth daily. 12/07/22  Yes Almon Hercules, MD  clopidogrel (PLAVIX) 75 MG tablet Take 1 tablet (75 mg total) by mouth daily. 12/06/22  Yes Almon Hercules, MD  ezetimibe (ZETIA) 10 MG  tablet Take 1 tablet (10 mg total) by mouth daily. 12/07/22  Yes Almon Hercules, MD  famotidine (PEPCID) 20 MG tablet TAKE 1 TABLET AT BEDTIME 11/11/22  Yes Burchette, Elberta Fortis, MD  finasteride (PROSCAR) 5 MG tablet TAKE ONE TABLET BY MOUTH ONCE DAILY 01/15/23  Yes Burchette, Elberta Fortis, MD  gabapentin (NEURONTIN) 100 MG capsule Take 2 capsules in AM and 2 in PM 01/17/23  Yes Burchette, Elberta Fortis, MD  metoprolol succinate (TOPROL-XL) 25 MG 24 hr tablet Take 1 tablet (25 mg total) by mouth daily. 12/07/22  Yes Almon Hercules, MD  polyethylene glycol (MIRALAX / GLYCOLAX) packet Take 17 g by mouth daily as needed for moderate constipation. Patient taking differently: Take 17 g by mouth 2 (two) times daily. 03/16/17  Yes Glade Lloyd, MD  torsemide (DEMADEX) 20 MG tablet Take 1 tablet (20 mg total) by mouth daily. 12/06/22  03/06/23 Yes Almon Hercules, MD     Critical care time: 35 min.   JD Anselm Lis [facility] 01/22/2023, 8:49 AM  Please see [email or web address] for pager details.  From 7A-7P if no response, please call [phone]. After hours, please call ELink [phone].

## 2023-01-22 NOTE — Consult Note (Signed)
Palliative Care Consult Note                                  Date: 01/22/2023   Patient Name: Darin [name]  DOB: 10-21-1931  MRN: [id number]  Age / Sex: 89 y.o., male  PCP: Darin Covey, MD Referring Physician: Karren Burly, MD  Reason for Consultation: {Reason for Consult:23484}  HPI/Patient Profile: 89 y.o. male  with past medical history of *** admitted on 01/21/2023 with ***.   Past Medical History:  Diagnosis Date   Arthritis    DUPUYTREN'S CONTRACTURE, RIGHT 07/04/2009   Annotation: 4th digit Qualifier: Diagnosis of  By: Darin Sheehan MD, Darin [name]    GERD (gastroesophageal reflux disease)    H/O hiatal hernia    "FOR YEARS" " NO PROBLEM"   Hyperlipidemia    Hypertension    Knee pain    Neuropathy    PERIPHERAL    Osteoporosis    PMR (polymyalgia rheumatica) (HCC)    Polymyalgia (HCC) 5 YEARS    Subjective:   This NP Darin [name] reviewed medical records, received report from team, assessed the patient and then meet at the patient's bedside to discuss diagnosis, prognosis, GOC, EOL wishes disposition and options.  I met with ***.   We meet to discuss diagnosis prognosis, GOC, EOL wishes, disposition and options. Concept of Palliative Care was introduced as specialized medical care for people and their families living with serious illness.  If focuses on providing relief from the symptoms and stress of a serious illness.  The goal is to improve quality of life for both the patient and the family. Values and goals of care important to patient and family were attempted to be elicited.  ***  Created space and opportunity for patient  and family to explore thoughts and feelings regarding current medical situation   Natural trajectory and current clinical status were discussed. Questions and concerns addressed. Patient  encouraged to call with questions or concerns.    Patient/Family Understanding of  Illness: ***  Life Review: ***  Patient Values: ***  Goals: ***  Today's Discussion: ***  Review of Systems  Objective:   Primary Diagnoses: Present on Admission: **None**   Physical Exam  Vital Signs:  BP (!) 91/46   Pulse (!) 59   Temp 98.8 F (37.1 C) (Oral)   Resp 15   Ht 6\' 1"  (1.854 m)   Wt 91.5 kg   SpO2 92%   BMI 26.61 kg/m   Palliative Assessment/Data: ***    Advanced Care Planning:   Existing Vynca/ACP Documentation: ***  Primary Decision Maker: {Primary Decision AOZHY:86578}  Code Status/Advance Care Planning: {Palliative Code status:23503}  A discussion was had today regarding advanced directives. Concepts specific to code status, artifical feeding and hydration, continued IV antibiotics and rehospitalization was had.  The difference between a aggressive medical intervention path and a palliative comfort care path for this patient at this time was had. ***The MOST form was introduced and discussed.***  Decisions/Changes to ACP: ***  Assessment & Plan:   Impression: ***  SUMMARY OF RECOMMENDATIONS   ***  Symptom Management:  ***  Prognosis:  {Palliative Care Prognosis:23504}  Discharge Planning:  {Palliative dispostion:23505}   Discussed with: ***    Thank you for allowing [place] to participate in the care of Darin [name] PMT will continue to support holistically.  Time Total: ***  Detailed review of medical records (  labs, imaging, vital signs), medically appropriate exam, discussed with treatment team, counseling and education to patient, family, & staff, documenting clinical information, medication management, coordination of care  Signed by: Darin Dust, NP Palliative Medicine Team  Team Phone # [phone] (Nights/Weekends)  01/22/2023, 2:54 PM

## 2023-01-22 NOTE — Progress Notes (Signed)
   Brief Palliative Medicine Progress Note:  PMT consult received and chart reviewed. Goals of care completed, full note to follow.  Recommendations: Confirmed DNR-limited Avoid invasive procedures including catheterization Okay with medications, including anticoagulation to try to get better and discharged home Treat the treatable to feel as good as possible for as long as possible When it is his time to die he wants to die peacefully and at home and be buried next to his wife   Thank you for allowing [place] to participate in the care of Darin [name]  Signed by: Wynne Dust, NP Palliative Medicine Team [facility] CHARGE  Team Phone # [phone] (Nights/Weekends)  01/22/2023, 4:38 PM

## 2023-01-23 ENCOUNTER — Ambulatory Visit: Payer: Medicare Other | Admitting: Family Medicine

## 2023-01-23 DIAGNOSIS — Z515 Encounter for palliative care: Secondary | ICD-10-CM | POA: Diagnosis not present

## 2023-01-23 DIAGNOSIS — Z66 Do not resuscitate: Secondary | ICD-10-CM

## 2023-01-23 DIAGNOSIS — I5043 Acute on chronic combined systolic (congestive) and diastolic (congestive) heart failure: Secondary | ICD-10-CM | POA: Diagnosis not present

## 2023-01-23 DIAGNOSIS — N179 Acute kidney failure, unspecified: Secondary | ICD-10-CM | POA: Diagnosis not present

## 2023-01-23 DIAGNOSIS — I959 Hypotension, unspecified: Secondary | ICD-10-CM | POA: Diagnosis not present

## 2023-01-23 LAB — COMPREHENSIVE METABOLIC PANEL
ALT: 42 U/L (ref 0–44)
AST: 22 U/L (ref 15–41)
Albumin: 2.7 g/dL — ABNORMAL LOW (ref 3.5–5.0)
Alkaline Phosphatase: 91 U/L (ref 38–126)
Anion gap: 8 (ref 5–15)
BUN: 59 mg/dL — ABNORMAL HIGH (ref 8–23)
CO2: 24 mmol/L (ref 22–32)
Calcium: 8.4 mg/dL — ABNORMAL LOW (ref 8.9–10.3)
Chloride: 103 mmol/L (ref 98–111)
Creatinine, Ser: 2.07 mg/dL — ABNORMAL HIGH (ref 0.61–1.24)
GFR, Estimated: 30 mL/min — ABNORMAL LOW (ref >60)
Glucose, Bld: 108 mg/dL — ABNORMAL HIGH (ref 70–99)
Potassium: 4.2 mmol/L (ref 3.5–5.1)
Sodium: 135 mmol/L (ref 135–145)
Total Bilirubin: 0.5 mg/dL (ref 0.3–1.2)
Total Protein: 5.7 g/dL — ABNORMAL LOW (ref 6.5–8.1)

## 2023-01-23 LAB — CBC
HCT: 30.8 % — ABNORMAL LOW (ref 39.0–52.0)
Hemoglobin: 9.9 g/dL — ABNORMAL LOW (ref 13.0–17.0)
MCH: 30.5 pg (ref 26.0–34.0)
MCHC: 32.1 g/dL (ref 30.0–36.0)
MCV: 94.8 fL (ref 80.0–100.0)
Platelets: 174 10*3/uL (ref 150–400)
RBC: 3.25 MIL/uL — ABNORMAL LOW (ref 4.22–5.81)
RDW: 14.3 % (ref 11.5–15.5)
WBC: 5.4 10*3/uL (ref 4.0–10.5)
nRBC: 0 % (ref 0.0–0.2)

## 2023-01-23 LAB — T3, FREE: T3, Free: 1.8 pg/mL — ABNORMAL LOW (ref 2.0–4.4)

## 2023-01-23 MED ORDER — CEFDINIR 300 MG PO CAPS
300.0000 mg | ORAL_CAPSULE | Freq: Two times a day (BID) | ORAL | Status: DC
  Administered 2023-01-23 – 2023-01-25 (×5): 300 mg via ORAL
  Filled 2023-01-23 (×6): qty 1

## 2023-01-23 NOTE — Progress Notes (Signed)
Advanced Care Planning Note   Patient Name: Darin [name]       Date: 01/23/2023 DOB: Nov 30, 1931  Age: 89 y.o. MRN#: [id number] Attending Physician: Karren Burly, MD Primary Care Physician: [name], MD Admit Date: 01/21/2023 Length of Stay: 2 days  Advanced Care Planning Details:   Persons Present: ACP Persons Present: Patient  Provider(s) Present: Palliative Medicine Provider Wynne Dust, NP  All persons present were open and agreeable to conversation about advanced care planning. Education offered on the importance of documentation and the logistics of securing advanced directives.   We had in-depth discussion on CODE STATUS, interventions desired (invasive versus noninvasive), advance directives  Education was offered on the following:   Medical treatment options available, Possible risks and benefits of each option/intervention, Available ACP documents able to be completed, and Importance/Benefit of completing ACP documents  Forms completed include:  Other scheduled spiritual care consult to update advance directives These forms can be found in the ACP link in the area under the patient's picture  After conversation, patient and/or family determined the desired discharge disposition at this time would be:  Home    Summary of ACP Decisions:   Remain DNR-limited No invasive procedures including catheterization or feeding tubes Okay with fluids and medications Update advanced directives including living will and HCPOA     Thank you for allowing [place] to participate in the care of Darin [name] PMT will continue to support holistically.  Total ACP Time: 30 min  The time above is specific to time spend on advanced care planning. It does not include any separately billed services.  Signed by: Wynne Dust, DNP, AGNP-C Palliative Medicine Team  Team Phone # [phone] (Nights/Weekends)  01/23/2023, 9:29 AM

## 2023-01-23 NOTE — Progress Notes (Signed)
Daily Progress Note   Patient Name: Darin [name]       Date: 01/23/2023 DOB: [birth year]  Age: 89 y.o. MRN#: [id number] Attending Physician: Karren Burly, MD Primary Care Physician: [name], MD Admit Date: 01/21/2023 Length of Stay: 2 days  Reason for Consultation/Follow-up: Establishing goals of care  HPI/Patient Profile:  88 y.o. male  with past medical history of chronic combined CHF (last echo Sept 2024 with EV 20-25% with WMA, G1DD, mod MR, mod to severe TR), HTN, HLD, PMR. Who presented to [facility] ED 10/29 with chest pain, nausea, diaphoresis. He was admitted on 01/21/2023 with CHF exacerbation, concern for ischemic cardiomyopathy, likely cardiogenic shock, acute pulmonary edema pleural effusions, AKI, mild transaminitis, and others.    Palliative medicine was consulted for GOC conversation.  Subjective:   Subjective: Chart Reviewed. Updates received. Patient Assessed. Created space and opportunity for patient  and family to explore thoughts and feelings regarding current medical situation.  Today's Discussion: Today saw the patient at bedside.  He states he feels better today than yesterday.  Hoping for transition out of the ICU to the floor to facilitate being discharged home.  He states he was told that when he is on the medical floor then PT and OT would work with him to hopefully get him home soon.  We spent some time reminiscing about things that he enjoys, substantial amount of time was spent discussing foods and desserts.  He reminisced about some of his favorite foods and eat a reason in [place].  I told him that palliative medicine would be backing off but would be available for any needs as they arise.  We reviewed our goals and agree that these are clear.  Overall the patient would like to be DNR, avoid invasive interventions.  He is agreeable to medications to live as long as possible as good as possible.  When it is time for him to pass he wants to pass  peacefully at home in his own bed.  I provided emotional and general support through therapeutic listening, empathy, sharing of stories, and other techniques. I answered all questions and addressed all concerns to the best of my ability.  Review of Systems  Constitutional:  Positive for fatigue.  Respiratory:  Negative for shortness of breath.   Cardiovascular:  Negative for chest pain.  Gastrointestinal:  Negative for abdominal pain and nausea.  Neurological:  Positive for weakness.    Objective:   Vital Signs:  BP (!) 110/49   Pulse 71   Temp 98 F (36.7 C) (Oral)   Resp (!) 24   Ht 6\' 1"  (1.854 m)   Wt 101.3 kg   SpO2 95%   BMI 29.46 kg/m   Physical Exam: Physical Exam Vitals and nursing note reviewed.  Constitutional:      General: He is not in acute distress.    Appearance: He is ill-appearing.  HENT:     Head: Normocephalic and atraumatic.  Cardiovascular:     Rate and Rhythm: Normal rate.  Pulmonary:     Effort: Pulmonary effort is normal. No respiratory distress.     Breath sounds: No wheezing or rhonchi.  Abdominal:     General: Abdomen is flat. Bowel sounds are normal. There is no distension.     Palpations: Abdomen is soft.     Tenderness: There is no abdominal tenderness.  Skin:    General: Skin is warm and dry.  Neurological:     General: No focal deficit  present.     Mental Status: He is alert.  Psychiatric:        Mood and Affect: Mood normal.        Behavior: Behavior normal.     Palliative Assessment/Data: 40-50%    Existing Vynca/ACP Documentation: Advance directives signed 10/13/2007  Assessment & Plan:   Impression: Present on Admission: **None**  89 year old male with chronic comorbidities and acute presentation as described above. At this point he has progressive heart failure with an EF now down to 20 to 25%. He previously declined catheterization at this point but cardiology feels that he is too high risk. This provided him  with relief, which gave [place] springboard into goals of care. He does not want any invasive procedures including catheterization or feeding tubes. He wishes to remain a DNR. However, he would like to treat the treatable with noninvasive treatments and medications, including anticoagulation. He understands that his health is progressively declining and when he is ready to die he would like to die peacefully at home in his own bed. Overall long-term prognosis guarded to poor.   SUMMARY OF RECOMMENDATIONS   Remain DNR-limited No invasive procedures Treat the treatable with medications and noninvasive interventions, including anticoagulation to feel as good as possible for as long as possible Spiritual care consult to update advance directives TOC referral for outpatient palliative care Palliative medicine will back off for now Please notify us of any significant clinical change or new palliative needs  Symptom Management:  Per primary team PMT is available to assist as needed  Code Status: DNR-limited  Prognosis: Unable to determine  Discharge Planning: To Be Determined  Discussed with: Patient, medical team, nursing team  Thank you for allowing [place] to participate in the care of Darin [name] PMT will continue to support holistically.  Time Total: 40 min  Detailed review of medical records (labs, imaging, vital signs), medically appropriate exam, discussed with treatment team, counseling and education to patient, family, & staff, documenting clinical information, medication management, coordination of care  Wynne Dust, NP Palliative Medicine Team  Team Phone # [phone] (Nights/Weekends)  11/21/2020, 8:17 AM

## 2023-01-23 NOTE — Progress Notes (Addendum)
NAME:  Montique Betzen, MRN:  [id number], DOB:  [birth year], LOS: 2 ADMISSION DATE:  01/21/2023, CONSULTATION DATE:  01/21/23 REFERRING MD:  Jacques Navy CHIEF COMPLAINT:  Volume Overload   History of Present Illness:  Jazmin Gradillas is a 89 y.o. male who has a PMH as below including but not limited to chronic combined CHF (last echo Sept 2024 with EV 20-25% with WMA, G1DD, mod MR, mod to severe TR), HTN, HLD, PMR. He presented to [facility] ED 10/29 with chest pain, nausea, diaphoresis. Workup in ED suggestive of CHF exacerbation, pulmonary edema, AKI, hypotension, bradycardia. EKG showed sinus bradycardia. He does take Benazepril, Toprol-XL, Torsemide at home and took all meds earlier on day of presentation.  He was evaluated by cardiology who felt he needed diuresis; however, he has soft pressures; therefore, was started on NE infusion. PCCM consulted for admission.  Pertinent  Medical History:  has CARCINOMA, BASAL CELL; Hyperlipidemia; POLYNEUROPATHY OTHER DISEASES CLASSIFIED ELSW; CATARACT ASSOCIATED WITH OTHER SYNDROMES; CERUMEN IMPACTION, BILATERAL; Essential hypertension; VARICOSE VEINS LOWER EXTREMITIES W/INFLAMMATION; GERD (gastroesophageal reflux disease); CONSTIPATION, DRUG INDUCED; DRY SKIN; SEC LOCALIZED OSTEOARTHROSIS INVOLVING HAND; Osteoarthritis; DEGENERATIVE DISC DISEASE, LUMBOSACRAL SPINE W/RADICULOPATHY; Localized osteoarthrosis, lower leg; Syncope; Hyponatremia; Chronic radicular low back pain; BPH (benign prostatic hyperplasia); Leg edema, right; Arthritis of right knee; Right wrist pain; Chronic left SI joint pain; Pain due to onychomycosis of toenails of both feet; Strain of left piriformis muscle; Greater trochanteric bursitis of left hip; Trigger finger, left ring finger; Arthritis of right ankle; Callus; Tibia/fibula fracture, right, closed, initial encounter; Chronic heel pain, right; Trigger finger, right middle finger; Acute respiratory failure with hypoxia (HCC); CHF exacerbation (HCC);  Normocytic anemia; Elevated troponin/likely NSTEMI; Acute exacerbation of CHF (congestive heart failure) (HCC); Acute renal failure (HCC); Hyperkalemia; and Hypotension on their problem list.   Significant Hospital Events: Including procedures, antibiotic start and stop dates in addition to other pertinent events   10/29 admit on low dose levo 10/30 stable off NE, BP low target SBP 80  Interim History / Subjective:  NE stopped. Midodrine started. He desires no invasive procedures per palliative. Ongoing GOC would advocate to not return to ICU, plan for comfort if worsens.   Objective:  Blood pressure (!) 96/46, pulse 66, temperature 98.5 F (36.9 C), temperature source Oral, resp. rate 15, height 6\' 1"  (1.854 m), weight 101.3 kg, SpO2 92%.        Intake/Output Summary (Last 24 hours) at 01/23/2023 0751 Last data filed at 01/23/2023 0400 Gross per 24 hour  Intake 628.61 ml  Output 3350 ml  Net -2721.39 ml   Filed Weights   01/21/23 2137 01/22/23 0316 01/23/23 0431  Weight: 91.5 kg 91.5 kg 101.3 kg    Examination: General:  NAD HEENT: MM pink/moist Neuro: Aox3; MAE CV: s1s2, RRR, no m/r/g PULM:  dim clear BS bilaterally GI: soft, bsx4 active  Extremities: warm/dry, trace ble edema  Skin: no rashes or lesions    Labs/imaging personally reviewed:  CXR 10/29 > edema, small effusions.  Assessment & Plan:   Shock: likely cardiogenic Chest pain: resolved; recent NSTEMI on 11/2022 patient deferred heart cath and discharged on plavix CHF exacerbation - last echo from Sept 2024 with EV 20-25% with WMA, G1DD, mod MR, mod to severe TR. Sinus bradycardia with bouts of paroxysmal slow A.flutter. Concern for ischemic cardiomyopathy - pt has declined workup/cath in the past. Hx HTN, HLD. Plan: -cardiology following; appreciate recs -NE d/c'd 10/30, continue midodrine, goal SBP 80 -tele monitoring; cont to hold  amio w/ concern for toxicity -Lasix -Cont clopidogrel and  zetia -hold home anti-hypertensives w/ hypotension -recently started on amio 11/2022 for new onset aflutter; consider AC per cards  Acute pulmonary edema with small pleural effusions - 2/2 CHF exacerbation. Plan: -currently on room air -Continue lasix  AKI - exacerbated by PTA ACEi. Hyperkalemia Hyponatremia Plan: -Lasix, appreciate cardiology assistance -hold home ace-inhibitor -Trend BMP / urinary output -Replace electrolytes as indicated -Avoid nephrotoxic agents, ensure adequate renal perfusion  Mild transaminitis - concern low flow state and hepatic congestion. -CT abd/pelvis showing gallbladder wall haziness and thickening; recommending RUQ [place] to rule out acute cholecystitis Plan: -ruq [place] OK -LFTs improved  Sick Euthyroid - TSH significantly elevated at 51. Of note, he was on Amiodarone, question Amio toxicity. Previous TSH in Sept 2024 was 4.7. -t4 normal Plan: -cont to hold amio  Chronic anemia Plan: -trend cbc  UTI: Burning with urination, pyuria, LE, nitrite on UA --complete course of oral cephalosporin, follow culture  Best practice (evaluated daily):  Diet/type: Regular consistency (see orders) DVT prophylaxis: prophylactic heparin  GI prophylaxis: H2B Lines: N/A Foley:  N/A Code Status:  DNR Last date of multidisciplinary goals of care discussion: 10/29 DNR/DNI Ongoing GOC would advocate to not return to ICU, plan for comfort if worsens. Palliative following.  Labs   CBC: Recent Labs  Lab 01/21/23 1704 01/22/23 0249 01/23/23 0311  WBC 6.0 5.0 5.4  NEUTROABS 4.7  --   --   HGB 10.6* 10.8* 9.9*  HCT 33.8* 33.8* 30.8*  MCV 97.4 96.3 94.8  PLT 207 184 174    Basic Metabolic Panel: Recent Labs  Lab 01/21/23 1704 01/22/23 0249 01/22/23 1456 01/23/23 0311  NA 134* 131* 135 135  K 5.8* 5.4* 4.1 4.2  CL 104 102 101 103  CO2 20* 22 21* 24  GLUCOSE 121* 132* 103* 108*  BUN 77* 75* 67* 59*  CREATININE 2.28* 2.17* 2.10* 2.07*  CALCIUM 8.3*  8.3* 8.6* 8.4*  MG 3.6* 3.6*  --   --   PHOS  --  5.2*  --   --    GFR: Estimated Creatinine Clearance: 30.3 mL/min (A) (by C-G formula based on SCr of 2.07 mg/dL (H)). Recent Labs  Lab 01/21/23 1704 01/22/23 0249 01/23/23 0311  WBC 6.0 5.0 5.4    Liver Function Tests: Recent Labs  Lab 01/21/23 1704 01/23/23 0311  AST 58* 22  ALT 62* 42  ALKPHOS 112 91  BILITOT 0.5 0.5  PROT 6.5 5.7*  ALBUMIN 3.0* 2.7*   No results for input(s): "LIPASE", "AMYLASE" in the last 168 hours. No results for input(s): "AMMONIA" in the last 168 hours.  ABG    Component Value Date/Time   HCO3 23.2 12/03/2022 1358   TCO2 24 12/03/2022 1358   ACIDBASEDEF 1.0 12/03/2022 1358   O2SAT 79 12/03/2022 1358     Coagulation Profile: No results for input(s): "INR", "PROTIME" in the last 168 hours.  Cardiac Enzymes: Recent Labs  Lab 01/21/23 2217  CKTOTAL 73    HbA1C: Hgb A1c MFr Bld  Date/Time Value Ref Range Status  12/04/2022 03:31 AM 5.7 (H) 4.8 - 5.6 % Final    Comment:    (NOTE)         Prediabetes: 5.7 - 6.4         Diabetes: >6.4         Glycemic control for adults with diabetes: <7.0   10/07/2022 10:02 AM 6.0 4.6 - 6.5 % Final  Comment:    Glycemic Control Guidelines for People with Diabetes:Non Diabetic:  <6%Goal of Therapy: <7%Additional Action Suggested:  >8%     CBG: Recent Labs  Lab 01/21/23 2130  GLUCAP 132*    Review of Systems:   N/a  Past Medical History:  He,  has a past medical history of Arthritis, DUPUYTREN'S CONTRACTURE, RIGHT (07/04/2009), GERD (gastroesophageal reflux disease), H/O hiatal hernia, Hyperlipidemia, Hypertension, Knee pain, Neuropathy, Osteoporosis, PMR (polymyalgia rheumatica) (HCC), and Polymyalgia (HCC) (5 YEARS).   Surgical History:   Past Surgical History:  Procedure Laterality Date   CARPAL TUNNEL RELEASE     CATARACT EXTRACTION     right   COLONOSCOPY  2007   INGUINAL HERNIA REPAIR  10/14/2011   Procedure: HERNIA REPAIR  INGUINAL ADULT;  Surgeon: Shelly Rubenstein, MD;  Location: [facility] OR;  Service: General;  Laterality: Right;  Right Inguinal Hernia Repair with Mesh   TIBIA IM NAIL INSERTION Right 05/22/2021   Procedure: INTRAMEDULLARY (IM) NAIL TIBIAL;  Surgeon: Myrene Galas, MD;  Location: [facility] OR;  Service: Orthopedics;  Laterality: Right;   TONSILLECTOMY     VARICOSE VEIN SURGERY       Social History:   reports that he has never smoked. He has never used smokeless tobacco. He reports that he does not currently use alcohol after a past usage of about 3.0 standard drinks of alcohol per week. He reports that he does not use drugs.   Family History:  His family history includes Coronary artery disease in an other family member; Heart disease in his father.   Allergies Allergies  Allergen Reactions   Statins Other (See Comments)    Muscle pain, upset stomach, and leg pain     Home Medications  Prior to Admission medications   Medication Sig Start Date End Date Taking? Authorizing Provider  amiodarone (PACERONE) 200 MG tablet Take 1 tablet (200 mg total) by mouth 2 (two) times daily for 5 days, THEN 1 tablet (200 mg total) daily. 12/06/22 03/06/23 Yes Almon Hercules, MD  benazepril (LOTENSIN) 5 MG tablet Take 1 tablet (5 mg total) by mouth daily. 12/07/22  Yes Almon Hercules, MD  clopidogrel (PLAVIX) 75 MG tablet Take 1 tablet (75 mg total) by mouth daily. 12/06/22  Yes Almon Hercules, MD  ezetimibe (ZETIA) 10 MG tablet Take 1 tablet (10 mg total) by mouth daily. 12/07/22  Yes Almon Hercules, MD  famotidine (PEPCID) 20 MG tablet TAKE 1 TABLET AT BEDTIME 11/11/22  Yes Burchette, Elberta Fortis, MD  finasteride (PROSCAR) 5 MG tablet TAKE ONE TABLET BY MOUTH ONCE DAILY 01/15/23  Yes Burchette, Elberta Fortis, MD  gabapentin (NEURONTIN) 100 MG capsule Take 2 capsules in AM and 2 in PM 01/17/23  Yes Burchette, Elberta Fortis, MD  metoprolol succinate (TOPROL-XL) 25 MG 24 hr tablet Take 1 tablet (25 mg total) by mouth daily. 12/07/22  Yes  Almon Hercules, MD  polyethylene glycol (MIRALAX / GLYCOLAX) packet Take 17 g by mouth daily as needed for moderate constipation. Patient taking differently: Take 17 g by mouth 2 (two) times daily. 03/16/17  Yes Glade Lloyd, MD  torsemide (DEMADEX) 20 MG tablet Take 1 tablet (20 mg total) by mouth daily. 12/06/22 03/06/23 Yes Almon Hercules, MD     Critical care time: n/a   Karren Burly, MD [facility] 01/23/2023, 7:51 AM  Please see [email or web address] for pager details.  From 7A-7P if no response, please call [phone]. After hours,  please call ELink [phone].

## 2023-01-23 NOTE — Progress Notes (Signed)
This chaplain responded to PMT NP-Eric consult for updating the Pt. Advance Directive.   The chaplain reviewed the chart notes and received an update from the Pt. RN-Nate before the visit. The chaplain understands the Pt. is moving to 5W today.  The Pt. is sleeping at the time of the visit. The chaplain will attempt a revisit on Friday.  Chaplain Stephanie Acre [phone]

## 2023-01-23 NOTE — Progress Notes (Signed)
   01/23/23 1608  TOC Brief Assessment  Insurance and Status Reviewed (Medicare A and B)  Patient has primary care physician Yes (Burchette, Elberta Fortis, MD)  Home environment has been reviewed Lives Alone   Daughter lives out of state  Prior level of function: fairly independent  Prior/Current Home Services No current home services  Social Determinants of Health Reivew SDOH reviewed no interventions necessary  Readmission risk has been reviewed Yes (22%)  Transition of care needs transition of care needs identified, TOC will continue to follow    OP Palliative being recommended  Following for additional recs : HH etc  [name] will assist if [facility] PT and OT are recommended  Patient lives alone and Daughter  lives  out of state.  Patient wants to return home.   TOC will continue to follow patient for any additional discharge needs

## 2023-01-23 NOTE — Progress Notes (Addendum)
Patient Name: Darin [name] Date of Encounter: 01/23/2023 [facility] Cardiologist: None Previously Dr. Algie Coffer --> wants to transition to Heart Care Dr. Royann Shivers   Interval Summary  .    Overall from a hemodynamic standpoint scores blood pressure, rates, volume status he looks much improved.  Also reporting feeling much better however little bit tired today.  No significant complaints, no chest pain or shortness of breath.  Vital Signs .    Vitals:   01/23/23 0940 01/23/23 1000 01/23/23 1100 01/23/23 1135  BP: 108/62 (!) 102/50 (!) 108/50   Pulse: 69 62 66   Resp: 17 12 13    Temp:    98 F (36.7 C)  TempSrc:    Oral  SpO2: 95% 95% 93%   Weight:      Height:        Intake/Output Summary (Last 24 hours) at 01/23/2023 1139 Last data filed at 01/23/2023 1024 Gross per 24 hour  Intake 552 ml  Output 3775 ml  Net -3223 ml      01/23/2023    4:31 AM 01/22/2023    3:16 AM 01/21/2023    9:37 PM  Last 3 Weights  Weight (lbs) 223 lb 5.2 oz 201 lb 11.5 oz 201 lb 11.5 oz  Weight (kg) 101.3 kg 91.5 kg 91.5 kg      Telemetry/ECG    Atrial fibrillation heart rates in the 60s-70s- Personally Reviewed  CV Studies    Echocardiogram 12/04/2022 1. Left ventricular ejection fraction, by estimation, is 20 to 25%. The  left ventricle has severely decreased function. The left ventricle  demonstrates regional wall motion abnormalities (see scoring  diagram/findings for description). Left ventricular  diastolic parameters are consistent with Grade I diastolic dysfunction  (impaired relaxation).   2. Right ventricular systolic function is mildly reduced. The right  ventricular size is normal. There is moderately elevated pulmonary artery  systolic pressure.   3. Left atrial size was mildly dilated.   4. Right atrial size was moderately dilated.   5. The mitral valve is degenerative. Moderate mitral valve regurgitation.   6. Tricuspid valve regurgitation is moderate to  severe.   7. The aortic valve is tricuspid. There is moderate calcification of the  aortic valve. There is mild thickening of the aortic valve. Aortic valve  regurgitation is mild. Aortic valve sclerosis/calcification is present,  without any evidence of aortic  stenosis.   8. The inferior vena cava is dilated in size with <50% respiratory  variability, suggesting right atrial pressure of 15 mmHg.   Conclusion(s)/Recommendation(s): Findings consistent with ischemic  cardiomyopathy.   Physical Exam .   GEN: No acute distress.   Neck: + JVD, improved Cardiac: irregularly irregular  respiratory: Clear to auscultation bilaterally. GI: Soft, nontender, non-distended  MS: 2+ edema worse in the right leg  Patient Profile    Darin [name] is a 89 y.o. male has hx of probable ischemic cardiomyopathy, chronic HFrEF, hypertension, hyperlipidemia, polymyalgia rheumatica and admitted on 01/21/2019 for for the evaluation of CHF exacerbation and possible amiodarone toxicity  Assessment & Plan .     Acute on chronic HFrEF 20-25% and cardiogenic shock Ischemic cardiomyopathy Moderate MR Moderate to severe TR Echocardiogram from September 2024 shows EF 20 to 25% with regional wall motion abnormalities concerning for ischemic cardiomyopathy.  Had mildly reduced RV function and moderately elevated PASP.  Initially on Levophed now transition to midodrine 10 mg 3 times daily.  Blood pressure still little soft however stable.  Patient  reports having baseline soft pressures.  He is diuresing very well with significant improvement in volume status.  Diuresed 3.3 L in the last 24 hours. Holding all home antihypertensives.  Continue midodrine 10mg  3 times daily Continue IV Lasix 80 mg twice daily  CAD Previously seen in September 2024 for NSTEMI with evidence of ischemic cardiomyopathy.  Had deferred cardiac catheterization at that time due to what seems like a lack of understanding of procedure as well as  incompatibility with cardiologist.  CT chest here shows multivessel coronary calcifications.  After speaking with palliative care would not want any invasive measures. Continue Plavix, holding statin due to elevated LFTs.  Holding beta-blocker due to bradycardia.  Persistent atrial fibrillation/flutter with slow ventricular rate Was started on amiodarone September 2024.  Presenting here with significant bradycardia with concern of amiodarone toxicity given bradycardia and elevated TSH and LFTs.  This has been discontinued with improvement rates now in the 60s to 70s.  Discussed initiation of DOAC, patient would like to decline.  Risk and benefits have been explained. Continue to hold Toprol-XL, amio has been stopped.   AKI Hyperk  Had normal renal function PTA. Improving with better HR and diuresis. Cr 2.07, 2.28 on admission. Potassium has normalized too.   Transaminitis  [place] no acute pathology, likely hepatic congestion and low output.     Goals of care/DNR Palliative care following.  Remains to be DNR-limited with declination of invasive procedures and anticoagulation.  For questions or updates, please contact [facility] Please consult [email or web address] for contact info under        Signed, Abagail Kitchens, PA-C     Personally seen and examined. Agree with above.  Midodrine has helped out, keeping blood pressures reasonable range in the low 100s as he normally sees at home.  Output -3 L.  Still has 3+ pitting edema. Comfortable.  Echo EF 20%  Acute on chronic systolic heart failure with concomitant cardiogenic shock secondary to ischemic cardiomyopathy with moderate mitral regurgitation severe tricuspid regurgitation - Continuing with IV Lasix 80 mg twice a day. - Continue midodrine 10mg  3 times a day  Persistent atrial fibrillation flutter with slow ventricular rate - Off of amiodarone.  Continue to hold Toprol.  Heart rates in the 60s.  Creatinine down from  2.1-2.07.  Gently improving.  Okay with transfer to floor.  Donato Schultz, MD

## 2023-01-24 DIAGNOSIS — I5021 Acute systolic (congestive) heart failure: Secondary | ICD-10-CM

## 2023-01-24 DIAGNOSIS — Z66 Do not resuscitate: Secondary | ICD-10-CM | POA: Diagnosis not present

## 2023-01-24 DIAGNOSIS — Z515 Encounter for palliative care: Secondary | ICD-10-CM | POA: Diagnosis not present

## 2023-01-24 DIAGNOSIS — N179 Acute kidney failure, unspecified: Secondary | ICD-10-CM | POA: Diagnosis not present

## 2023-01-24 DIAGNOSIS — R570 Cardiogenic shock: Secondary | ICD-10-CM | POA: Diagnosis not present

## 2023-01-24 DIAGNOSIS — I5043 Acute on chronic combined systolic (congestive) and diastolic (congestive) heart failure: Secondary | ICD-10-CM | POA: Diagnosis not present

## 2023-01-24 DIAGNOSIS — I2510 Atherosclerotic heart disease of native coronary artery without angina pectoris: Secondary | ICD-10-CM | POA: Diagnosis not present

## 2023-01-24 LAB — CBC
HCT: 30.5 % — ABNORMAL LOW (ref 39.0–52.0)
Hemoglobin: 9.8 g/dL — ABNORMAL LOW (ref 13.0–17.0)
MCH: 30.5 pg (ref 26.0–34.0)
MCHC: 32.1 g/dL (ref 30.0–36.0)
MCV: 95 fL (ref 80.0–100.0)
Platelets: 185 10*3/uL (ref 150–400)
RBC: 3.21 MIL/uL — ABNORMAL LOW (ref 4.22–5.81)
RDW: 14.4 % (ref 11.5–15.5)
WBC: 5.5 10*3/uL (ref 4.0–10.5)
nRBC: 0 % (ref 0.0–0.2)

## 2023-01-24 LAB — URINE CULTURE: Culture: 20000 — AB

## 2023-01-24 MED ORDER — FINASTERIDE 5 MG PO TABS
5.0000 mg | ORAL_TABLET | Freq: Every day | ORAL | Status: DC
  Administered 2023-01-24 – 2023-01-25 (×2): 5 mg via ORAL
  Filled 2023-01-24 (×2): qty 1

## 2023-01-24 MED ORDER — OXYCODONE HCL 5 MG PO TABS
5.0000 mg | ORAL_TABLET | Freq: Once | ORAL | Status: AC
  Administered 2023-01-24: 5 mg via ORAL
  Filled 2023-01-24: qty 1

## 2023-01-24 NOTE — Progress Notes (Signed)
Heart Failure Navigator Progress Note  Assessed for Heart & Vascular TOC clinic readiness.  Patient does not meet criteria due to [facility] appointment scheduled on 11/25.   Navigator sign off at this time.  Roxy Horseman, RN, BSN [facility] Heart Failure Navigator Secure Chat Only

## 2023-01-24 NOTE — Progress Notes (Signed)
   Patient Name: Darin [name] Date of Encounter: 01/24/2023 [facility] Cardiologist: None he would like to see Dr. Vickii Chafe in the future if possible, mentioned by name.  I would also be happy to help follow-up with him as well.  Interval Summary  .    Doing well on midodrine.  Off of norepinephrine.  Transferred to floor yesterday.  Diuresing with IV Lasix.  Has low normal blood pressure at home.  One more day of diuresis. Did ask to go home but one more day of IV lasix makes sense.   Vital Signs .    Vitals:   01/23/23 2230 01/24/23 0010 01/24/23 0400 01/24/23 0442  BP:      Pulse: 62     Resp: 13     Temp:  98.2 F (36.8 C) 98.2 F (36.8 C)   TempSrc:  Axillary Oral   SpO2: 95%     Weight:    101.3 kg  Height:        Intake/Output Summary (Last 24 hours) at 01/24/2023 0909 Last data filed at 01/24/2023 0400 Gross per 24 hour  Intake 550 ml  Output 2200 ml  Net -1650 ml      01/24/2023    4:42 AM 01/23/2023    4:31 AM 01/22/2023    3:16 AM  Last 3 Weights  Weight (lbs) 223 lb 5.2 oz 223 lb 5.2 oz 201 lb 11.5 oz  Weight (kg) 101.3 kg 101.3 kg 91.5 kg      Telemetry/ECG    Atrial fibrillation heart rate in the 60s- Personally Reviewed  Physical Exam .   GEN: No acute distress.   Neck: No JVD Cardiac: Irregularly irregular, no murmurs, rubs, or gallops.  Respiratory: Clear to auscultation bilaterally. GI: Soft, nontender, non-distended  MS: No edema  Assessment & Plan .     89 year old male acute on chronic systolic heart failure with ejection fraction of 20% Ischemic cardiomyopathy Moderate MR Moderate to severe TR Secondary pulmonary hypertension -Continuing with IV Lasix 80 mg twice a day -Continuing with midodrine 10 mg 3 times a day. -Unable to utilize other forms of goal-directed medical therapy secondary to hypotension. -Creatinine down to 2.07.  Potassium stable at 4.2.  ALT now normal at 42.  Mild anemia hemoglobin 9.8.  Coronary  artery disease Non-STEMI September 2024 -Did not wish to proceed with cardiac catheterization and opted for medical management. -Plavix -Zetia 10 mg  Persistent atrial fibrillation slow ventricular response - DOAC has been declined.  Understands potential risk for stroke.  Toprol has been discontinued.  Amiodarone stopped previously.  There was concern previously of amiodarone toxicity with elevated TSH and elevated LFTs.  Elevated LFTs were likely secondary to hepatic congestion in the setting of acute systolic heart failure as well.  No need for amiodarone.  Goals of care/DNR - Appreciate palliative care team discussions.  Avoiding invasive procedures.  -Tomorrow should be fine for DC -Would place him on lasix 40mg  PO every day then with continued midodrine. Spoke with Dr. Jerral Ralph.    For questions or updates, please contact [facility] Please consult [email or web address] for contact info under        Signed, Donato Schultz, MD

## 2023-01-24 NOTE — Progress Notes (Signed)
This chaplain is present with the PMT Pt. for updating the Pt. Advance Directive. This AD supercedes any other AD.  The Pt. answered the chaplain's clarifying questions after an AD education review. The Pt. is naming Elita Quick as his healthcare agent. If this person is unable or unwilling to serve in this role the Pt. next choice is Madalyn Rob.  This chaplain is present with the Pt., notary, and witnesses for the notarizing of the Pt. AD-HCPOA and Living Will.  The chaplain returned the completed original AD along with two copies to the Pt. The chaplain scanned the Pt. AD into the Pt. EMR.  This chaplain is available for F/U spiritual care as needed.  Chaplain Stephanie Acre [phone]

## 2023-01-24 NOTE — TOC Progression Note (Signed)
Transition of Care [facility]) - Progression Note    Patient Details  Name: Darin [name] MRN: [id number] Date of Birth: 03-31-1931  Transition of Care [facility]) CM/SW Contact  Kermit Balo, RN Phone Number: 01/24/2023, 1:45 PM  Clinical Narrative:      Pt is from home with caregiver 8 am-11 am and 5 pm to 8 pm 5 days a week. He manages himself on the weekends. He states his neighbors also help out as needed.  Pt states he has needed DME at home.  Current recommendations are for SNF. Pt is refusing. Home health arranged with [facility] and information is on the AVS. [name] will contact him for the first home visit.  Palliative care arranged with [facility]. They will contact him for the first home visit.  Pt requesting PTAR for transport home. CM has verified his home address.  TOC following.      Expected Discharge Plan and Services                                               Social Determinants of Health (SDOH) Interventions SDOH Screenings   Food Insecurity: No Food Insecurity (01/21/2023)  Housing: Low Risk  (01/21/2023)  Transportation Needs: No Transportation Needs (01/21/2023)  Utilities: Not At Risk (01/21/2023)  Alcohol Screen: Low Risk  (08/30/2022)  Depression (PHQ2-9): Low Risk  (08/30/2022)  Financial Resource Strain: Low Risk  (08/30/2022)  Physical Activity: Sufficiently Active (08/30/2022)  Social Connections: Moderately Integrated (08/30/2022)  Stress: No Stress Concern Present (08/30/2022)  Tobacco Use: Low Risk  (12/03/2022)    Readmission Risk Interventions     No data to display

## 2023-01-24 NOTE — Progress Notes (Addendum)
PROGRESS NOTE        PATIENT DETAILS Name: Darin [name] Age: 89 y.o. Sex: male Date of Birth: [birth year] Admit Date: 01/21/2023 Admitting Physician No admitting provider for patient encounter. WUJ:[id number], Elberta Fortis, MD  Brief Summary: Patient is a 89 y.o.  male with history of chronic HFrEF, CAD (non-STEMI September 2024-refused LHC), persistent atrial fibrillation-presented to the hospital on 10/23 with nausea/diaphoresis-found to have cardiogenic shock/AKI-admitted to the ICU-required pressors-cardiology consulted-stabilized and transferred to [facility].  Significant events: 10/29>> cardiogenic shock-IV pressors-admit to ICU 11/01>> transferred to [facility]  Significant studies: 09/11>> echo: EF 20-25% 10/29>> CXR: Pulmonary vascular congestion 10/29>> CT chest/abdomen/pelvis: bilateral pleural effusion, hiatal hernia, right inguinal hernia. 10/30>> RUQ ultrasound:  Significant microbiology data: 10/30>> urine culture: Multiple species  Procedures: None  Consults: Cardiologist PCCM  Subjective: Lying comfortably in bed-denies any chest pain or shortness of breath.  Objective: Vitals: Blood pressure (!) 112/48, pulse 66, temperature 98.3 F (36.8 C), temperature source Oral, resp. rate 13, height 6\' 1"  (1.854 m), weight 101.3 kg, SpO2 93%.   Exam: Gen Exam:Alert awake-not in any distress HEENT:atraumatic, normocephalic Chest: B/L clear to auscultation anteriorly CVS:S1S2 regular Abdomen:soft non tender, non distended Extremities:no edema Neurology: Non focal Skin: no rash  Pertinent Labs/Radiology:    Latest Ref Rng & Units 01/24/2023    3:31 AM 01/23/2023    3:11 AM 01/22/2023    2:49 AM  CBC  WBC 4.0 - 10.5 K/uL 5.5  5.4  5.0   Hemoglobin 13.0 - 17.0 g/dL 9.8  9.9  95.6   Hematocrit 39.0 - 52.0 % 30.5  30.8  33.8   Platelets 150 - 400 K/uL 185  174  184     Lab Results  Component Value Date   NA 135 01/23/2023   K 4.2 01/23/2023    CL 103 01/23/2023   CO2 24 01/23/2023      Assessment/Plan: Cardiogenic shock Required pressors in the ICU Blood pressure now stable on midodrine.  Acute on chronic HFrEF Volume status much improved Discussed with cardiology Continue IV Lasix today-plan to transition to oral diuretics and likely home on 11/2 Continue midodrine on discharge  Moderate MR Moderate to severe TR Pulmonary hypertension due to left-sided heart failure Supportive care Diuretics Patient does not desire invasive procedures/aggressive care  CAD-recent non-STEMI September 2024 Did not want to pursue LHC Being managed medically with antiplatelets  Persistent atrial fibrillation with slow ventricular response Supportive care Patient does not want anticoagulation (see cardiology note) Not on beta-blocker due to cardiogenic shock-amiodarone discontinued several days back-due to concern for amiodarone toxicity (elevated TSH/transaminases) Telemetry monitoring  AKI Hemodynamically mediated-probably cardiorenal syndrome Improving with supportive care Unclear why foley cath inserted-but will attempt voiding trial today Avoid nephrotoxic agents Repeat electrolytes tomorrow  Sick euthyroid syndrome versus amiodarone toxicity No longer on amiodarone Repeat thyroid function test in 4 to 6 weeks  Normocytic anemia Due to acute illness No evidence of GI loss Repeat CBC a.m. labs  Palliative care DNR Gentle medical treatment-no heroics-no invasive procedures- if declines-he is open to hospice care  BMI: Estimated body mass index is 29.46 kg/m as calculated from the following:   Height as of this encounter: 6\' 1"  (1.854 m).   Weight as of this encounter: 101.3 kg.   Code status:   Code Status: Limited: Do not attempt resuscitation (DNR) -DNR-LIMITED -Do Not  Intubate/DNI    DVT Prophylaxis: heparin injection 5,000 Units Start: 01/21/23 2200 SCDs Start: 01/21/23 2050    Family Communication: None  at bedside   Disposition Plan: Status is: Inpatient Remains inpatient appropriate because: Severity of illness   Planned Discharge Destination:Home   Diet: Diet Order             Diet Heart Room service appropriate? Yes; Fluid consistency: Thin  Diet effective now                     Antimicrobial agents: Anti-infectives (From admission, onward)    Start     Dose/Rate Route Frequency Ordered Stop   01/23/23 1000  cefdinir (OMNICEF) capsule 300 mg        300 mg Oral Every 12 hours 01/23/23 0757 01/27/23 0959   01/22/23 1245  cefTRIAXone (ROCEPHIN) 2 g in sodium chloride 0.9 % 100 mL IVPB  Status:  Discontinued        2 g 200 mL/hr over 30 Minutes Intravenous Every 24 hours 01/22/23 1158 01/23/23 0757        MEDICATIONS: Scheduled Meds:  cefdinir  300 mg Oral Q12H   Chlorhexidine Gluconate Cloth  6 each Topical Q2000   clopidogrel  75 mg Oral Daily   ezetimibe  10 mg Oral Daily   famotidine  20 mg Oral QHS   furosemide  80 mg Intravenous BID   heparin  5,000 Units Subcutaneous Q8H   midodrine  10 mg Oral TID WC   Continuous Infusions: PRN Meds:.docusate sodium, mouth rinse, polyethylene glycol   I have personally reviewed following labs and imaging studies  LABORATORY DATA: CBC: Recent Labs  Lab 01/21/23 1704 01/22/23 0249 01/23/23 0311 01/24/23 0331  WBC 6.0 5.0 5.4 5.5  NEUTROABS 4.7  --   --   --   HGB 10.6* 10.8* 9.9* 9.8*  HCT 33.8* 33.8* 30.8* 30.5*  MCV 97.4 96.3 94.8 95.0  PLT 207 184 174 185    Basic Metabolic Panel: Recent Labs  Lab 01/21/23 1704 01/22/23 0249 01/22/23 1456 01/23/23 0311  NA 134* 131* 135 135  K 5.8* 5.4* 4.1 4.2  CL 104 102 101 103  CO2 20* 22 21* 24  GLUCOSE 121* 132* 103* 108*  BUN 77* 75* 67* 59*  CREATININE 2.28* 2.17* 2.10* 2.07*  CALCIUM 8.3* 8.3* 8.6* 8.4*  MG 3.6* 3.6*  --   --   PHOS  --  5.2*  --   --     GFR: Estimated Creatinine Clearance: 30.3 mL/min (A) (by C-G formula based on SCr of  2.07 mg/dL (H)).  Liver Function Tests: Recent Labs  Lab 01/21/23 1704 01/23/23 0311  AST 58* 22  ALT 62* 42  ALKPHOS 112 91  BILITOT 0.5 0.5  PROT 6.5 5.7*  ALBUMIN 3.0* 2.7*   No results for input(s): "LIPASE", "AMYLASE" in the last 168 hours. No results for input(s): "AMMONIA" in the last 168 hours.  Coagulation Profile: No results for input(s): "INR", "PROTIME" in the last 168 hours.  Cardiac Enzymes: Recent Labs  Lab 01/21/23 2217  CKTOTAL 73    BNP (last 3 results) No results for input(s): "PROBNP" in the last 8760 hours.  Lipid Profile: No results for input(s): "CHOL", "HDL", "LDLCALC", "TRIG", "CHOLHDL", "LDLDIRECT" in the last 72 hours.  Thyroid Function Tests: Recent Labs    01/21/23 1704 01/21/23 2217  TSH 51.031*  --   FREET4  --  0.74  T3FREE  --  1.8*  Anemia Panel: No results for input(s): "VITAMINB12", "FOLATE", "FERRITIN", "TIBC", "IRON", "RETICCTPCT" in the last 72 hours.  Urine analysis:    Component Value Date/Time   COLORURINE YELLOW 01/22/2023 0930   APPEARANCEUR HAZY (A) 01/22/2023 0930   LABSPEC 1.012 01/22/2023 0930   PHURINE 6.0 01/22/2023 0930   GLUCOSEU NEGATIVE 01/22/2023 0930   HGBUR NEGATIVE 01/22/2023 0930   HGBUR negative 11/30/2009 1133   BILIRUBINUR NEGATIVE 01/22/2023 0930   KETONESUR NEGATIVE 01/22/2023 0930   PROTEINUR NEGATIVE 01/22/2023 0930   UROBILINOGEN 0.2 03/29/2012 1458   NITRITE POSITIVE (A) 01/22/2023 0930   LEUKOCYTESUR LARGE (A) 01/22/2023 0930    Sepsis Labs: Lactic Acid, Venous No results found for: "LATICACIDVEN"  MICROBIOLOGY: Recent Results (from the past 240 hour(s))  MRSA Next Gen by PCR, Nasal     Status: None   Collection Time: 01/21/23  9:33 PM   Specimen: Nasal Mucosa; Nasal Swab  Result Value Ref Range Status   MRSA by PCR Next Gen NOT DETECTED NOT DETECTED Final    Comment: (NOTE) The GeneXpert MRSA Assay (FDA approved for NASAL specimens only), is one component of a  comprehensive MRSA colonization surveillance program. It is not intended to diagnose MRSA infection nor to guide or monitor treatment for MRSA infections. Test performance is not FDA approved in patients less than 10 years old. Performed at [facility], [address], [place], [place] [address]   Urine Culture (for pregnant, neutropenic or urologic patients or patients with an indwelling urinary catheter)     Status: Abnormal   Collection Time: 01/22/23 12:02 PM   Specimen: In/Out Cath Urine  Result Value Ref Range Status   Specimen Description IN/OUT CATH URINE  Final   Special Requests   Final    NONE Performed at [facility], [address], [place], [place] [address]    Culture (A)  Final    20,000 COLONIES/mL MULTIPLE SPECIES PRESENT, SUGGEST RECOLLECTION   Report Status 01/24/2023 FINAL  Final    RADIOLOGY STUDIES/RESULTS: US Abdomen Limited RUQ (LIVER/GB)  Result Date: 01/22/2023 CLINICAL DATA:  Elevated liver enzymes. EXAM: ULTRASOUND ABDOMEN LIMITED RIGHT UPPER QUADRANT COMPARISON:  Noncontrast CT scan 01/21/2023. FINDINGS: Gallbladder: Gallbladder is mildly distended. No shadowing stones. There is slight gallbladder wall thickening, nonspecific. Common bile duct: Diameter: 4 mm Liver: Slightly heterogeneous hepatic parenchyma. Portal vein is patent on color Doppler imaging with normal direction of blood flow towards the liver. Other: None. IMPRESSION: Heterogeneous hepatic parenchyma. Please correlate for any known history of chronic liver disease. No biliary ductal dilatation. No gallstones identified but there is slight nonspecific gallbladder wall thickening. Electronically Signed   By: Karen Kays M.D.   On: 01/22/2023 17:02     LOS: 3 days   Jeoffrey Massed, MD  [facility]    To contact the attending provider between 7A-7P or the covering provider during after hours 7P-7A, please log into the web site [email or web address] and access using universal  [place] password for that web site. If you do not have the password, please call the hospital operator.  01/24/2023, 9:52 AM

## 2023-01-24 NOTE — Evaluation (Signed)
Occupational Therapy Evaluation Patient Details Name: Darin [name] MRN: [id number] DOB: [birth year] Today's Date: 01/24/2023   History of Present Illness Pt is an 89 y/o M admitted on 01/21/23 after presenting with c/o chest pain, nausea, diaphoresis. Work up suggestive of CHF exacerbation, pulmonary edema, AKI, hypotension, & bradycardia. PMH: chronic combined CHF, HTN, HLD, PMR   Clinical Impression   Pt admitted for above, presents with generalized weakness of BLEs and displays decreased activity tolerance. Pt intially needing assist with STS before progressing to CGA, with ADLs he needs Max to setup assist. Pt only able to tolerate pivots to/from and was fatigued upon returning to bed from [facility]. He does have assist from PCAs but has a period of 5hrs where he is home during the day without assist. Pt would benefit from continued acute skilled OT services to address deficits and help transition to next level of care. Patient would benefit from post acute Home OT services to help maximize functional independence in natural environment        If plan is discharge home, recommend the following: A little help with bathing/dressing/bathroom;Assistance with cooking/housework    Functional Status Assessment  Patient has had a recent decline in their functional status and demonstrates the ability to make significant improvements in function in a reasonable and predictable amount of time.  Equipment Recommendations  None recommended by OT (Pt has rec DME)    Recommendations for Other Services       Precautions / Restrictions Precautions Precautions: Fall Restrictions Weight Bearing Restrictions: No      Mobility Bed Mobility Overal bed mobility: Needs Assistance Bed Mobility: Supine to Sit, Sit to Supine     Supine to sit: Modified independent (Device/Increase time), HOB elevated, Used rails Sit to supine: Modified independent (Device/Increase time), Used rails         Transfers Overall transfer level: Needs assistance Equipment used: Rolling walker (2 wheels) Transfers: Sit to/from Stand, Bed to chair/wheelchair/BSC Sit to Stand: Mod assist, From elevated surface     Step pivot transfers: Contact guard assist     General transfer comment: STS from raised EOB Mod A to power up, STS from [facility] CGA      Balance Overall balance assessment: Needs assistance Sitting-balance support: Feet supported, Bilateral upper extremity supported Sitting balance-Leahy Scale: Fair     Standing balance support: During functional activity, Bilateral upper extremity supported, Reliant on assistive device for balance Standing balance-Leahy Scale: Poor Standing balance comment: Reliant on RW                           ADL either performed or assessed with clinical judgement   ADL Overall ADL's : Needs assistance/impaired Eating/Feeding: Independent;Sitting   Grooming: Sitting;Supervision/safety;Set up   Upper Body Bathing: Sitting;Supervision/ safety;Set up   Lower Body Bathing: Sitting/lateral leans;Minimal assistance   Upper Body Dressing : Sitting;Set up   Lower Body Dressing: Sitting/lateral leans;Maximal assistance   Toilet Transfer: Stand-pivot;Contact guard assist;Rolling walker (2 wheels);BSC/3in1   Toileting- Clothing Manipulation and Hygiene: Sit to/from stand;Maximal assistance Toileting - Clothing Manipulation Details (indicate cue type and reason): needs assist with rear pericare     Functional mobility during ADLs: Contact guard assist;Rolling walker (2 wheels) (pivots to/from)       Vision         Perception         Praxis         Pertinent Vitals/Pain Pain Assessment Pain Assessment: No/denies pain  Extremity/Trunk Assessment Upper Extremity Assessment Upper Extremity Assessment: Overall WFL for tasks assessed   Lower Extremity Assessment Lower Extremity Assessment: Generalized weakness   Cervical /  Trunk Assessment Cervical / Trunk Assessment: Normal   Communication Communication Communication: No apparent difficulties   Cognition Arousal: Alert Behavior During Therapy: WFL for tasks assessed/performed Overall Cognitive Status: Within Functional Limits for tasks assessed                                       General Comments  Desat to 87% on RA but returns to 94%. Pt fatigue following transfer to/from [facility]    Exercises     Shoulder Instructions      Home Living Family/patient expects to be discharged to:: Private residence Living Arrangements: Alone Available Help at Discharge: Personal care attendant Type of Home: House Home Access: Level entry     Home Layout: One level     Bathroom Shower/Tub: Producer, television/film/video: Handicapped height     Home Equipment: Agricultural consultant (2 wheels);Wheelchair - Sport and exercise psychologist Comments: Has personal care aide 5 days a week, 7 hours total per day but divided up between AM & PM.      Prior Functioning/Environment Prior Level of Function : Driving;Needs assist             Mobility Comments: Limited ambulation in home with RW, will use w/c for longer distances. ADLs Comments: PCA assists with household chores. Pt reports he's able to toilet & prep meals for himself on the weekends when he does not have a PCA coming in.        OT Problem List: Decreased strength;Decreased activity tolerance;Impaired balance (sitting and/or standing)      OT Treatment/Interventions: Self-care/ADL training;Balance training;Therapeutic exercise;Therapeutic activities;Energy conservation;Patient/family education;DME and/or AE instruction    OT Goals(Current goals can be found in the care plan section) Acute Rehab OT Goals Patient Stated Goal: To get strong enough to go home OT Goal Formulation: With patient Time For Goal Achievement: 02/07/23 Potential to Achieve Goals: Good ADL Goals Pt Will  Perform Grooming: with modified independence;sitting Pt Will Perform Upper Body Dressing: with modified independence;sitting Pt Will Perform Lower Body Dressing: sitting/lateral leans;with set-up Pt Will Perform Tub/Shower Transfer: with supervision;Stand pivot transfer;Shower transfer Additional ADL Goal #1: Pt will independently use energy conservation strategies prn to engage in functional tasks  OT Frequency: Min 1X/week    Co-evaluation              AM-PAC OT "6 Clicks" Daily Activity     Outcome Measure Help from another person eating meals?: None Help from another person taking care of personal grooming?: A Little Help from another person toileting, which includes using toliet, bedpan, or urinal?: A Lot Help from another person bathing (including washing, rinsing, drying)?: A Little Help from another person to put on and taking off regular upper body clothing?: A Little Help from another person to put on and taking off regular lower body clothing?: A Lot 6 Click Score: 17   End of Session Equipment Utilized During Treatment: Gait belt;Rolling walker (2 wheels) Nurse Communication: Mobility status  Activity Tolerance: Patient tolerated treatment well Patient left: in bed;with call bell/phone within reach;with bed alarm set  OT Visit Diagnosis: Unsteadiness on feet (R26.81);Other abnormalities of gait and mobility (R26.89);Muscle weakness (generalized) (M62.81)  Time: 0981-1914 OT Time Calculation (min): 34 min Charges:  OT General Charges $OT Visit: 1 Visit OT Evaluation $OT Eval Moderate Complexity: 1 Mod OT Treatments $Self Care/Home Management : 8-22 mins  01/24/2023  AB, OTR/L  Acute Rehabilitation Services  Office: [phone]   Tristan Schroeder 01/24/2023, 1:59 PM

## 2023-01-24 NOTE — Plan of Care (Signed)

## 2023-01-24 NOTE — Evaluation (Signed)
Physical Therapy Evaluation Patient Details Name: Darin [name] MRN: [id number] DOB: 11/27/1931 Today's Date: 01/24/2023  History of Present Illness  Pt is an 89 y/o M admitted on 01/21/23 after presenting with c/o chest pain, nausea, diaphoresis. Work up suggestive of CHF exacerbation, pulmonary edema, AKI, hypotension, & bradycardia. PMH: chronic combined CHF, HTN, HLD, PMR  Clinical Impression  Pt seen for PT evaluation with pt agreeable. Pt declines getting into recliner, noting he's already sat up today & it "wore him out". Pt is able to complete supine>sit with mod I with HOB elevated, use of bed rails, STS with close supervision, and a few side steps at EOB with RW & CGA. Overall session limited by notary arriving for pt to complete paperwork. Pt would benefit from ongoing PT services to address endurance, balance, & gait with LRAD. Pt adamant about d/c home vs rehab; if pt returns home, recommend HHPT, OT.        If plan is discharge home, recommend the following: A little help with bathing/dressing/bathroom;A little help with walking and/or transfers;Assistance with cooking/housework;Assist for transportation;Help with stairs or ramp for entrance   Can travel by private vehicle   Yes    Equipment Recommendations None recommended by PT  Recommendations for Other Services       Functional Status Assessment Patient has had a recent decline in their functional status and demonstrates the ability to make significant improvements in function in a reasonable and predictable amount of time.     Precautions / Restrictions Precautions Precautions: Fall Restrictions Weight Bearing Restrictions: No      Mobility  Bed Mobility Overal bed mobility: Needs Assistance Bed Mobility: Supine to Sit     Supine to sit: Modified independent (Device/Increase time), HOB elevated, Used rails          Transfers Overall transfer level: Needs assistance Equipment used: Rolling walker (2  wheels) Transfers: Sit to/from Stand Sit to Stand: Supervision           General transfer comment: STS from low EOB, extra time to power up to standing.    Ambulation/Gait Ambulation/Gait assistance: Contact guard assist Gait Distance (Feet): 3 Feet (to the R, side stepping along EOB with RW) Assistive device: Rolling walker (2 wheels)   Gait velocity: decreased     General Gait Details: decreased foot clearance BLE  Stairs            Wheelchair Mobility     Tilt Bed    Modified Rankin (Stroke Patients Only)       Balance Overall balance assessment: Needs assistance Sitting-balance support: Feet supported, Bilateral upper extremity supported Sitting balance-Leahy Scale: Fair     Standing balance support: During functional activity, Bilateral upper extremity supported, Reliant on assistive device for balance Standing balance-Leahy Scale: Poor                               Pertinent Vitals/Pain Pain Assessment Pain Assessment: No/denies pain    Home Living Family/patient expects to be discharged to:: Private residence Living Arrangements: Alone Available Help at Discharge: Personal care attendant Type of Home: House Home Access: Level entry       Home Layout: One level Home Equipment: Agricultural consultant (2 wheels);Wheelchair - manual Additional Comments: Has personal care aide 5 days a week, 7 hours total per day but divided up between AM & PM.    Prior Function Prior Level of Function : Driving  Mobility Comments: Limited ambulation in home with RW, will use w/c for longer distances. ADLs Comments: PCA assists with household chores. Pt reports he's able to toilet & prep meals for himself on the weekends when he does not have a PCA coming in.     Extremity/Trunk Assessment   Upper Extremity Assessment Upper Extremity Assessment: Overall WFL for tasks assessed    Lower Extremity Assessment Lower Extremity Assessment:  Generalized weakness    Cervical / Trunk Assessment Cervical / Trunk Assessment: Normal  Communication      Cognition Arousal: Alert Behavior During Therapy: WFL for tasks assessed/performed Overall Cognitive Status: Within Functional Limits for tasks assessed                                          General Comments      Exercises     Assessment/Plan    PT Assessment Patient needs continued PT services  PT Problem List Decreased strength;Cardiopulmonary status limiting activity;Decreased activity tolerance;Decreased balance;Decreased mobility;Decreased knowledge of use of DME       PT Treatment Interventions DME instruction;Balance training;Gait training;Neuromuscular re-education;Stair training;Functional mobility training;Therapeutic activities;Therapeutic exercise;Manual techniques;Patient/family education    PT Goals (Current goals can be found in the Care Plan section)  Acute Rehab PT Goals Patient Stated Goal: go home (tomorrow) PT Goal Formulation: With patient Time For Goal Achievement: 02/07/23 Potential to Achieve Goals: Good    Frequency Min 1X/week     Co-evaluation               AM-PAC PT "6 Clicks" Mobility  Outcome Measure Help needed turning from your back to your side while in a flat bed without using bedrails?: None Help needed moving from lying on your back to sitting on the side of a flat bed without using bedrails?: A Little Help needed moving to and from a bed to a chair (including a wheelchair)?: A Little Help needed standing up from a chair using your arms (e.g., wheelchair or bedside chair)?: A Little Help needed to walk in hospital room?: A Little Help needed climbing 3-5 steps with a railing? : A Lot 6 Click Score: 18    End of Session   Activity Tolerance: Patient tolerated treatment well (limited 2/2 notary coming in to complete paperwork) Patient left: with call bell/phone within reach (sitting EOB, chaplain &  other staff present) Nurse Communication: Mobility status PT Visit Diagnosis: Muscle weakness (generalized) (M62.81);Difficulty in walking, not elsewhere classified (R26.2);Unsteadiness on feet (R26.81)    Time: 1610-9604 PT Time Calculation (min) (ACUTE ONLY): 9 min   Charges:   PT Evaluation $PT Eval Low Complexity: 1 Low   PT General Charges $$ ACUTE PT VISIT: 1 Visit         Aleda Grana, PT, DPT 01/24/23, 12:06 PM   Sandi Mariscal 01/24/2023, 12:04 PM

## 2023-01-25 ENCOUNTER — Other Ambulatory Visit (HOSPITAL_COMMUNITY): Payer: Self-pay

## 2023-01-25 DIAGNOSIS — I5043 Acute on chronic combined systolic (congestive) and diastolic (congestive) heart failure: Secondary | ICD-10-CM | POA: Diagnosis not present

## 2023-01-25 LAB — CBC
HCT: 33.7 % — ABNORMAL LOW (ref 39.0–52.0)
Hemoglobin: 10.9 g/dL — ABNORMAL LOW (ref 13.0–17.0)
MCH: 30.2 pg (ref 26.0–34.0)
MCHC: 32.3 g/dL (ref 30.0–36.0)
MCV: 93.4 fL (ref 80.0–100.0)
Platelets: 220 10*3/uL (ref 150–400)
RBC: 3.61 MIL/uL — ABNORMAL LOW (ref 4.22–5.81)
RDW: 14 % (ref 11.5–15.5)
WBC: 5.8 10*3/uL (ref 4.0–10.5)
nRBC: 0 % (ref 0.0–0.2)

## 2023-01-25 LAB — BASIC METABOLIC PANEL
Anion gap: 9 (ref 5–15)
BUN: 27 mg/dL — ABNORMAL HIGH (ref 8–23)
CO2: 27 mmol/L (ref 22–32)
Calcium: 9 mg/dL (ref 8.9–10.3)
Chloride: 98 mmol/L (ref 98–111)
Creatinine, Ser: 1.36 mg/dL — ABNORMAL HIGH (ref 0.61–1.24)
GFR, Estimated: 50 mL/min — ABNORMAL LOW (ref >60)
Glucose, Bld: 112 mg/dL — ABNORMAL HIGH (ref 70–99)
Potassium: 3.6 mmol/L (ref 3.5–5.1)
Sodium: 134 mmol/L — ABNORMAL LOW (ref 135–145)

## 2023-01-25 LAB — TSH: TSH: 38.951 u[IU]/mL — ABNORMAL HIGH (ref 0.350–4.500)

## 2023-01-25 LAB — T4, FREE: Free T4: 0.72 ng/dL (ref 0.61–1.12)

## 2023-01-25 MED ORDER — CEFDINIR 300 MG PO CAPS
300.0000 mg | ORAL_CAPSULE | Freq: Two times a day (BID) | ORAL | 0 refills | Status: DC
  Filled 2023-01-25: qty 4, 2d supply, fill #0

## 2023-01-25 MED ORDER — AMIODARONE HCL 200 MG PO TABS: 200.0000 mg | ORAL_TABLET | Freq: Every day | ORAL | Status: DC

## 2023-01-25 MED ORDER — METOPROLOL SUCCINATE ER 25 MG PO TB24
25.0000 mg | ORAL_TABLET | Freq: Every day | ORAL | Status: DC
  Administered 2023-01-25: 25 mg via ORAL
  Filled 2023-01-25: qty 1

## 2023-01-25 MED ORDER — GABAPENTIN 100 MG PO CAPS
100.0000 mg | ORAL_CAPSULE | Freq: Two times a day (BID) | ORAL | Status: DC
  Administered 2023-01-25: 100 mg via ORAL
  Filled 2023-01-25: qty 1

## 2023-01-25 MED ORDER — MIDODRINE HCL 5 MG PO TABS
5.0000 mg | ORAL_TABLET | Freq: Two times a day (BID) | ORAL | 0 refills | Status: DC
  Filled 2023-01-25: qty 60, 30d supply, fill #0

## 2023-01-25 MED ORDER — LEVOTHYROXINE SODIUM 25 MCG PO TABS
25.0000 ug | ORAL_TABLET | Freq: Every day | ORAL | 0 refills | Status: DC
  Filled 2023-01-25: qty 30, 30d supply, fill #0

## 2023-01-25 MED ORDER — MIDODRINE HCL 5 MG PO TABS
5.0000 mg | ORAL_TABLET | Freq: Three times a day (TID) | ORAL | Status: DC
  Administered 2023-01-25: 5 mg via ORAL

## 2023-01-25 MED ORDER — TORSEMIDE 20 MG PO TABS
20.0000 mg | ORAL_TABLET | Freq: Every day | ORAL | Status: DC
  Administered 2023-01-25: 20 mg via ORAL
  Filled 2023-01-25: qty 1

## 2023-01-25 NOTE — Plan of Care (Signed)

## 2023-01-25 NOTE — Discharge Summary (Signed)
Darin [name] VWU:[id number] DOB: 1931-10-31 DOA: 01/21/2023  PCP: [name], MD  Admit date: 01/21/2023  Discharge date: 01/25/2023  Admitted From: Home   Disposition:  Home refused SNF   Recommendations for Outpatient Follow-up:   Follow up with PCP in 1-2 weeks  PCP Please obtain BMP/CBC, 2 view CXR in 1week,  (see Discharge instructions)   PCP Please follow up on the following pending results: Check TSH, free T4, CBC and BMP in 3 to 4 days.  Review right upper quadrant ultrasound report.  Outpatient GI follow-up required.   Home Health: PT, aide   Equipment/Devices: walker  Consultations: PCCM, cardiology Discharge Condition: Stable    CODE STATUS: Full    Diet Recommendation: Heart Healthy with 1.5 L fluid restriction per day  Chief Complaint  Patient presents with   Chest Pain     Brief history of present illness from the day of admission and additional interim summary    89 y.o.  male with history of chronic HFrEF, CAD (non-STEMI September 2024-refused LHC), persistent atrial fibrillation-presented to the hospital on 10/23 with nausea/diaphoresis-found to have cardiogenic shock/AKI-admitted to the ICU-required pressors-cardiology consulted-stabilized and transferred to [facility].   Significant events: 10/29>> cardiogenic shock-IV pressors-admit to ICU 11/01>> transferred to [facility]   Significant studies: 09/11>> echo: EF 20-25% 10/29>> CXR: Pulmonary vascular congestion 10/29>> CT chest/abdomen/pelvis: bilateral pleural effusion, hiatal hernia, right inguinal hernia. 10/30>> RUQ ultrasound:                                                                 Hospital Course   Cardiogenic shock Required pressors in the ICU Blood pressure now stable on midodrine.  Dose being tapered, low-dose  beta-blocker and diuretic on board due to underlying CHF.  PCP to monitor blood pressure and adjust midodrine and CHF medications closely.   Acute on chronic HFrEF EF 20% Volume status much improved Discussed with cardiology Lasix switched to home dose oral diuretic today, not a candidate for ACE/ARB due to underlying renal insufficiency, placed on fluid restriction follow with PCP and cardiology within a week of discharge he is completely symptom-free now.   Moderate MR Moderate to severe TR Pulmonary hypertension due to left-sided heart failure Supportive care Diuretics Patient does not desire invasive procedures/aggressive care   CAD-recent non-STEMI September 2024 Did not want to pursue LHC Being managed medically with antiplatelets   Persistent atrial fibrillation with slow ventricular response Supportive care Patient does not want anticoagulation (see cardiology note) Not on beta-blocker due to cardiogenic shock-amiodarone discontinued several days back-due to concern for amiodarone toxicity (elevated TSH/transaminases) Telemetry monitoring   AKI Hemodynamically mediated-probably cardiorenal syndrome Improving with supportive care Renal function gradually improving, tolerating diuretics, avoid nephrotoxins PCP to monitor.   Sick euthyroid syndrome versus amiodarone toxicity No longer on amiodarone  for a few weeks prior to admission per patient, however his TSH extremely elevated above 50, repeat TSH and free T4, placing on low-dose Synthroid, PCP to monitor TSH and free T4 closely,    Normocytic anemia Due to acute illness No evidence of GI loss CBC PCP to monitor, on Eliquis   Discharge diagnosis     Principal Problem:   Acute exacerbation of CHF (congestive heart failure) (HCC) Active Problems:   Acute renal failure (HCC)   Hyperkalemia   Hypotension    Discharge instructions    Discharge Instructions     Discharge instructions   Complete by: As  directed    Follow with Primary MD [name], MD in 7 days   Get CBC, CMP, magnesium, TSH, free T4, 2 view Chest X ray -  checked next visit with your primary MD   Activity: As tolerated with Full fall precautions use walker/cane & assistance as needed  Disposition Home   Diet: Heart Healthy Check your Weight same time everyday, if you gain over 2 pounds, or you develop in leg swelling, experience more shortness of breath or chest pain, call your Primary MD immediately. Follow Cardiac Low Salt Diet and 1.5 lit/day fluid restriction.  Special Instructions: If you have smoked or chewed Tobacco  in the last 2 yrs please stop smoking, stop any regular Alcohol  and or any Recreational drug use.  On your next visit with your primary care physician please Get Medicines reviewed and adjusted.  Please request your Prim.MD to go over all Hospital Tests and Procedure/Radiological results at the follow up, please get all Hospital records sent to your Prim MD by signing hospital release before you go home.  If you experience worsening of your admission symptoms, develop shortness of breath, life threatening emergency, suicidal or homicidal thoughts you must seek medical attention immediately by calling 911 or calling your MD immediately  if symptoms less severe.  You Must read complete instructions/literature along with all the possible adverse reactions/side effects for all the Medicines you take and that have been prescribed to you. Take any new Medicines after you have completely understood and accpet all the possible adverse reactions/side effects.   Do not drive when taking Pain medications.  Do not take more than prescribed Pain, Sleep and Anxiety Medications   Increase activity slowly   Complete by: As directed        Discharge Medications   Allergies as of 01/25/2023       Reactions   Statins Other (See Comments)   Muscle pain, upset stomach, and leg pain        Medication  List     STOP taking these medications    amiodarone 200 MG tablet Commonly known as: PACERONE   benazepril 5 MG tablet Commonly known as: LOTENSIN       TAKE these medications    cefdinir 300 MG capsule Commonly known as: OMNICEF Take 1 capsule (300 mg total) by mouth every 12 (twelve) hours.   clopidogrel 75 MG tablet Commonly known as: PLAVIX Take 1 tablet (75 mg total) by mouth daily.   ezetimibe 10 MG tablet Commonly known as: ZETIA Take 1 tablet (10 mg total) by mouth daily.   famotidine 20 MG tablet Commonly known as: PEPCID TAKE 1 TABLET AT BEDTIME   finasteride 5 MG tablet Commonly known as: PROSCAR TAKE ONE TABLET BY MOUTH ONCE DAILY   gabapentin 100 MG capsule Commonly known as: NEURONTIN Take 2 capsules in  AM and 2 in PM   levothyroxine 25 MCG tablet Commonly known as: Synthroid Take 1 tablet (25 mcg total) by mouth daily before breakfast.   metoprolol succinate 25 MG 24 hr tablet Commonly known as: TOPROL-XL Take 1 tablet (25 mg total) by mouth daily.   midodrine 5 MG tablet Commonly known as: PROAMATINE Take 1 tablet (5 mg total) by mouth 2 (two) times daily with a meal.   polyethylene glycol 17 g packet Commonly known as: MIRALAX / GLYCOLAX Take 17 g by mouth daily as needed for moderate constipation. What changed: when to take this   torsemide 20 MG tablet Commonly known as: DEMADEX Take 1 tablet (20 mg total) by mouth daily.               Durable Medical Equipment  (From admission, onward)           Start     Ordered   01/25/23 0909  For home use only DME Walker rolling  Once       Comments: 5 wheel  Question Answer Comment  Walker: With 5 Inch Wheels   Patient needs a walker to treat with the following condition Weakness      01/25/23 0909             Follow-up Information     [facility], [facility] Follow up.   Specialty: Home Health Services Why: The home health agency will contact you for the first  home visit Contact information: [address] [place] [address] [phone]         [facility] Follow up.   Why: The palliative care will contact you for the first home visit. Contact information: [address] [facility] [place] [address] [phone]                Major procedures and Radiology Reports - PLEASE review detailed and final reports thoroughly  -       US Abdomen Limited RUQ (LIVER/GB)  Result Date: 01/22/2023 CLINICAL DATA:  Elevated liver enzymes. EXAM: ULTRASOUND ABDOMEN LIMITED RIGHT UPPER QUADRANT COMPARISON:  Noncontrast CT scan 01/21/2023. FINDINGS: Gallbladder: Gallbladder is mildly distended. No shadowing stones. There is slight gallbladder wall thickening, nonspecific. Common bile duct: Diameter: 4 mm Liver: Slightly heterogeneous hepatic parenchyma. Portal vein is patent on color Doppler imaging with normal direction of blood flow towards the liver. Other: None. IMPRESSION: Heterogeneous hepatic parenchyma. Please correlate for any known history of chronic liver disease. No biliary ductal dilatation. No gallstones identified but there is slight nonspecific gallbladder wall thickening. Electronically Signed   By: Karen Kays M.D.   On: 01/22/2023 17:02   CT CHEST ABDOMEN PELVIS WO CONTRAST  Result Date: 01/21/2023 CLINICAL DATA:  Sepsis EXAM: CT CHEST, ABDOMEN AND PELVIS WITHOUT CONTRAST TECHNIQUE: Multidetector CT imaging of the chest, abdomen and pelvis was performed following the standard protocol without IV contrast. RADIATION DOSE REDUCTION: This exam was performed according to the departmental dose-optimization program which includes automated exposure control, adjustment of the mA and/or kV according to patient size and/or use of iterative reconstruction technique. COMPARISON:  None Available. FINDINGS: CT CHEST FINDINGS Cardiovascular: Normal heart size. No significant pericardial effusion. The thoracic aorta is  normal in caliber. Moderate to severe atherosclerotic plaque of the thoracic aorta. Four-vessel coronary artery calcifications. Cardiac changes suggestive of anemia. Mediastinum/Nodes: No gross hilar adenopathy, noting limited sensitivity for the detection of hilar adenopathy on this noncontrast study. No enlarged mediastinal or axillary lymph nodes.  Thyroid gland, trachea, and esophagus demonstrate no significant findings. Tiny hiatal hernia. Lungs/Pleura: No focal consolidation. No pulmonary nodule. No pulmonary mass. Small to moderate right and small left pleural effusions. No pneumothorax. Musculoskeletal: No chest wall abnormality. Diffusely decreased bone density. No suspicious lytic or blastic osseous lesions. No acute displaced fracture. Multilevel degenerative changes of the spine. CT ABDOMEN PELVIS FINDINGS Hepatobiliary: No focal liver abnormality. No CT evidence of calcified gallstones. Gallbladder wall haziness and thickening. Pericholecystic fluid. No biliary dilatation. Pancreas: No focal lesion. Normal pancreatic contour. No surrounding inflammatory changes. No main pancreatic ductal dilatation. Spleen: Normal in size without focal abnormality. Adrenals/Urinary Tract: No adrenal nodule bilaterally. No nephrolithiasis and no hydronephrosis. Left renal fluid density likely represents a simple renal cyst. Simple renal cysts, in the absence of clinically indicated signs/symptoms, require no independent follow-up. No ureterolithiasis or hydroureter. The urinary bladder is unremarkable. Stomach/Bowel: Stomach is within normal limits. No evidence of bowel wall thickening or dilatation. Colonic diverticulosis. Status post appendectomy. Vascular/Lymphatic: No abdominal aorta or iliac aneurysm. Severe atherosclerotic plaque of the aorta and its branches. No abdominal, pelvic, or inguinal lymphadenopathy. Reproductive: Prostate is enlarged measuring up to 5.2  Cm. Other: No intraperitoneal free fluid. No  intraperitoneal free gas. No organized fluid collection. Musculoskeletal: Mild subcutaneus soft tissue edema. Right inguinal hernia containing a short loop of small bowel with associated free fluid. Tiny fat containing left inguinal hernia containing a short segment of urinary bladder wall. Diastasis rectus.  Tiny fat containing umbilical hernia. Diffusely decreased bone density. No suspicious lytic or blastic osseous lesions. No acute displaced fracture. Multilevel degenerative changes of the spine. IMPRESSION: 1. Small to moderate right and small left pleural effusions. 2. Tiny hiatal hernia. 3. Gallbladder wall haziness and thickening. Pericholecystic fluid. Recommend correlation with liver function tests and a right upper quadrant ultrasound for further evaluation of possible acute cholecystitis. 4. Right inguinal hernia containing a short loop of small bowel with associated free fluid. No pneumatosis, bowel thickening, or associated bowel obstruction of the contained bowel. Given free fluid which could be secondary to ischemia of the mesentery, correlate with physical exam for hernia incarceration. 5. Tiny fat containing left inguinal hernia containing a short segment of urinary bladder wall. 6. Colonic diverticulosis with no acute diverticulitis. 7. Prostatomegaly. 8.  Aortic Atherosclerosis (ICD10-I70.0). Electronically Signed   By: Tish Frederickson M.D.   On: 01/21/2023 22:47   DG Chest Portable 1 View  Result Date: 01/21/2023 CLINICAL DATA:  Chest pain EXAM: PORTABLE CHEST 1 VIEW COMPARISON:  Chest x-ray 12/05/2022. FINDINGS: There small pleural effusions, unchanged. Cardiomegaly and central pulmonary vascular congestion are again noted. There is no pneumothorax. No acute fractures are seen. IMPRESSION: 1. Cardiomegaly and central pulmonary vascular congestion. 2. Small pleural effusions, unchanged. Electronically Signed   By: Darliss Cheney M.D.   On: 01/21/2023 20:28    Micro Results     Recent  Results (from the past 240 hour(s))  MRSA Next Gen by PCR, Nasal     Status: None   Collection Time: 01/21/23  9:33 PM   Specimen: Nasal Mucosa; Nasal Swab  Result Value Ref Range Status   MRSA by PCR Next Gen NOT DETECTED NOT DETECTED Final    Comment: (NOTE) The GeneXpert MRSA Assay (FDA approved for NASAL specimens only), is one component of a comprehensive MRSA colonization surveillance program. It is not intended to diagnose MRSA infection nor to guide or monitor treatment for MRSA infections. Test performance is not FDA approved in patients  less than 15 years old. Performed at [facility], [address] [place], [place] [address]   Urine Culture (for pregnant, neutropenic or urologic patients or patients with an indwelling urinary catheter)     Status: Abnormal   Collection Time: 01/22/23 12:02 PM   Specimen: In/Out Cath Urine  Result Value Ref Range Status   Specimen Description IN/OUT CATH URINE  Final   Special Requests   Final    NONE Performed at [facility], [address] [place], [place] [address]    Culture (A)  Final    20,000 COLONIES/mL MULTIPLE SPECIES PRESENT, SUGGEST RECOLLECTION   Report Status 01/24/2023 FINAL  Final    Today   Subjective    Canaan Holzer today has no headache,no chest abdominal pain,no new weakness tingling or numbness, feels much better wants to go home today.     Objective   Blood pressure (!) 102/38, pulse 88, temperature 98.3 F (36.8 C), temperature source Oral, resp. rate 18, height 6\' 1"  (1.854 m), weight 101.4 kg, SpO2 92%.   Intake/Output Summary (Last 24 hours) at 01/25/2023 0919 Last data filed at 01/25/2023 0200 Gross per 24 hour  Intake --  Output 2050 ml  Net -2050 ml    Exam  Awake Alert, No new F.N deficits,    [place].AT,PERRAL Supple Neck,   Symmetrical Chest wall movement, Good air movement bilaterally, CTAB RRR,No Gallops,   +ve B.Sounds, Abd Soft, Non tender,  No Cyanosis, Clubbing or  edema    Data Review   Recent Labs  Lab 01/21/23 1704 01/22/23 0249 01/23/23 0311 01/24/23 0331 01/25/23 0450  WBC 6.0 5.0 5.4 5.5 5.8  HGB 10.6* 10.8* 9.9* 9.8* 10.9*  HCT 33.8* 33.8* 30.8* 30.5* 33.7*  PLT 207 184 174 185 220  MCV 97.4 96.3 94.8 95.0 93.4  MCH 30.5 30.8 30.5 30.5 30.2  MCHC 31.4 32.0 32.1 32.1 32.3  RDW 14.4 14.4 14.3 14.4 14.0  LYMPHSABS 0.6*  --   --   --   --   MONOABS 0.7  --   --   --   --   EOSABS 0.1  --   --   --   --   BASOSABS 0.0  --   --   --   --     Recent Labs  Lab 01/21/23 1704 01/22/23 0249 01/22/23 1456 01/23/23 0311 01/25/23 0450  NA 134* 131* 135 135 134*  K 5.8* 5.4* 4.1 4.2 3.6  CL 104 102 101 103 98  CO2 20* 22 21* 24 27  ANIONGAP 10 7 13 8 9   GLUCOSE 121* 132* 103* 108* 112*  BUN 77* 75* 67* 59* 27*  CREATININE 2.28* 2.17* 2.10* 2.07* 1.36*  AST 58*  --   --  22  --   ALT 62*  --   --  42  --   ALKPHOS 112  --   --  91  --   BILITOT 0.5  --   --  0.5  --   ALBUMIN 3.0*  --   --  2.7*  --   TSH 51.031*  --   --   --   --   BNP 604.5*  --   --   --   --   MG 3.6* 3.6*  --   --   --   CALCIUM 8.3* 8.3* 8.6* 8.4* 9.0    Total Time in preparing paper work, data evaluation and todays exam -  35 minutes  Signature  -    Susa Raring M.D on 01/25/2023 at 9:19 AM   -  To page go to [email or web address]

## 2023-01-25 NOTE — Discharge Instructions (Addendum)
Follow with Primary MD [name], MD in 7 days   Get CBC, CMP, magnesium, TSH, free T4, 2 view Chest X ray -  checked next visit with your primary MD   Activity: As tolerated with Full fall precautions use walker/cane & assistance as needed  Disposition Home   Diet: Heart Healthy Check your Weight same time everyday, if you gain over 2 pounds, or you develop in leg swelling, experience more shortness of breath or chest pain, call your Primary MD immediately. Follow Cardiac Low Salt Diet and 1.5 lit/day fluid restriction.  Special Instructions: If you have smoked or chewed Tobacco  in the last 2 yrs please stop smoking, stop any regular Alcohol  and or any Recreational drug use.  On your next visit with your primary care physician please Get Medicines reviewed and adjusted.  Please request your Prim.MD to go over all Hospital Tests and Procedure/Radiological results at the follow up, please get all Hospital records sent to your Prim MD by signing hospital release before you go home.  If you experience worsening of your admission symptoms, develop shortness of breath, life threatening emergency, suicidal or homicidal thoughts you must seek medical attention immediately by calling 911 or calling your MD immediately  if symptoms less severe.  You Must read complete instructions/literature along with all the possible adverse reactions/side effects for all the Medicines you take and that have been prescribed to you. Take any new Medicines after you have completely understood and accpet all the possible adverse reactions/side effects.   Do not drive when taking Pain medications.  Do not take more than prescribed Pain, Sleep and Anxiety Medications

## 2023-01-25 NOTE — TOC Transition Note (Signed)
Transition of Care [facility]) - CM/SW Discharge Note   Patient Details  Name: Darin [name] MRN: [id number] Date of Birth: 1931-06-10  [facility]) CM/SW Contact:  Lawerance Sabal, RN Phone Number: 01/25/2023, 10:36 AM   Clinical Narrative:      Sherron Monday w patient at bedside with nurse.  He confirmed DC to home w [facility] services and palliative, notified liaisons. Patient will need PTAR for home. Forms placed on chart and PTAR called for 1pm pick up        Patient Goals and CMS Choice      Discharge Placement                         Discharge Plan and Services Additional resources added to the After Visit Summary for                                       Social Determinants of Health (SDOH) Interventions SDOH Screenings   Food Insecurity: No Food Insecurity (01/21/2023)  Housing: Low Risk  (01/21/2023)  Transportation Needs: No Transportation Needs (01/21/2023)  Utilities: Not At Risk (01/21/2023)  Alcohol Screen: Low Risk  (08/30/2022)  Depression (PHQ2-9): Low Risk  (08/30/2022)  Financial Resource Strain: Low Risk  (08/30/2022)  Physical Activity: Sufficiently Active (08/30/2022)  Social Connections: Moderately Integrated (08/30/2022)  Stress: No Stress Concern Present (08/30/2022)  Tobacco Use: Low Risk  (12/03/2022)     Readmission Risk Interventions     No data to display

## 2023-01-25 NOTE — Plan of Care (Signed)

## 2023-01-26 ENCOUNTER — Other Ambulatory Visit: Payer: Self-pay | Admitting: Family Medicine

## 2023-01-26 DIAGNOSIS — I10 Essential (primary) hypertension: Secondary | ICD-10-CM

## 2023-01-27 ENCOUNTER — Telehealth: Payer: Self-pay

## 2023-01-27 ENCOUNTER — Telehealth: Payer: Self-pay | Admitting: Family Medicine

## 2023-01-27 NOTE — Telephone Encounter (Signed)
Noted.  Darin Merfeld W Andros Channing MD [facility]  

## 2023-01-27 NOTE — Transitions of Care (Post Inpatient/ED Visit) (Signed)
01/27/2023  Name: Darin [name] MRN: [id number] DOB: August 29, 1931  Today's TOC FU Call Status: Today's TOC FU Call Status:: Successful TOC FU Call Completed TOC FU Call Complete Date: 01/27/23 (Voicemail message received from pt. Return call to pt.) Patient's Name and Date of Birth confirmed.  Transition Care Management Follow-up Telephone Call Date of Discharge: 01/25/23 Discharge Facility: [facility]) Type of Discharge: Inpatient Admission Primary Inpatient Discharge Diagnosis:: "acute renal failure" How have you been since you were released from the hospital?: Better (Pt vocies he rested very well last night-did not have to get up and use bathroom as much as he nromally does, Apptite has been "robust", BM today, he has been up walking & moving around) Any questions or concerns?: No  Items Reviewed: Did you receive and understand the discharge instructions provided?: Yes Medications obtained,verified, and reconciled?: Yes (Medications Reviewed) (med review completed with pt-states cargiver fills med planner for him-he will confirm they are aware of changes as well) Any new allergies since your discharge?: No Dietary orders reviewed?: Yes Type of Diet Ordered:: low salt/heart healthy Do you have support at home?: Yes Name of Support/Comfort Primary Source: paid caregivers in the home in the mornings and evenings for several hrs/day  Medications Reviewed Today: Medications Reviewed Today     Reviewed by Charlyn Minerva, RN (Registered Nurse) on 01/27/23 at 1246  Med List Status: <None>   Medication Order Taking? Sig Documenting Provider Last Dose Status Informant  cefdinir (OMNICEF) 300 MG capsule [id number] Yes Take 1 capsule (300 mg total) by mouth every 12 (twelve) hours. Leroy Sea, MD Taking Active   clopidogrel (PLAVIX) 75 MG tablet [id number] Yes Take 1 tablet (75 mg total) by mouth daily. Almon Hercules, MD Taking Active Self, Pharmacy Records  ezetimibe  (ZETIA) 10 MG tablet [id number] Yes Take 1 tablet (10 mg total) by mouth daily. Almon Hercules, MD Taking Active Self, Pharmacy Records  famotidine (PEPCID) 20 MG tablet [id number] Yes TAKE 1 TABLET AT BEDTIME [name], MD Taking Active Self, Pharmacy Records  finasteride (PROSCAR) 5 MG tablet [id number] Yes TAKE ONE TABLET BY MOUTH ONCE DAILY Burchette, Elberta Fortis, MD Taking Active Self, Pharmacy Records  gabapentin (NEURONTIN) 100 MG capsule [id number] Yes Take 2 capsules in AM and 2 in PM Burchette, Elberta Fortis, MD Taking Active Self, Pharmacy Records  levothyroxine (SYNTHROID) 25 MCG tablet [id number] Yes Take 1 tablet (25 mcg total) by mouth daily before breakfast. Leroy Sea, MD Taking Active   metoprolol succinate (TOPROL-XL) 25 MG 24 hr tablet [id number] Yes Take 1 tablet (25 mg total) by mouth daily. Almon Hercules, MD Taking Active Self, Pharmacy Records  midodrine (PROAMATINE) 5 MG tablet [id number] Yes Take 1 tablet (5 mg total) by mouth 2 (two) times daily with a meal. Leroy Sea, MD Taking Active   polyethylene glycol [facility] / GLYCOLAX) packet [id number] Yes Take 17 g by mouth daily as needed for moderate constipation.  Patient taking differently: Take 17 g by mouth 2 (two) times daily.   Glade Lloyd, MD Taking Active Self, Pharmacy Records  torsemide [facility]) 20 MG tablet [id number] Yes Take 1 tablet (20 mg total) by mouth daily. Almon Hercules, MD Taking Active Self, Pharmacy Records  Med List Note Rulon Sera, CPhT 01/22/23 0900): Pt's Aid: Allena Napoleon [phone]            Home Care and Equipment/Supplies: Were Home Health Services Ordered?: Yes [facility] Agency:: [name] Has  Agency set up a time to come to your home?: No (Pt confirms he spoke with agency this AM - they will call him back to confirm visit time for Wed, pt has been referred to Palliative Care -aware to expect call from agency-has contact info will f/u if he does not hear from them  within48-72hrs post d/c) Any new equipment or medical supplies ordered?: NA  Functional Questionnaire: Do you need assistance with bathing/showering or dressing?: Yes (caregiver assists with ADLs/IADLs) Do you need assistance with meal preparation?: Yes Do you need assistance with eating?: No Do you have difficulty maintaining continence: No Do you need assistance with getting out of bed/getting out of a chair/moving?: No Do you have difficulty managing or taking your medications?: Yes  Follow up appointments reviewed: PCP Follow-up appointment confirmed?: Yes Date of PCP follow-up appointment?: 01/28/23 Follow-up Provider: Dr. Caryl Never Specialist [facility] Follow-up appointment confirmed?: NA Do you need transportation to your follow-up appointment?: No Do you understand care options if your condition(s) worsen?: Yes-patient verbalized understanding  SDOH Interventions Today    Flowsheet Row Most Recent Value  SDOH Interventions   Food Insecurity Interventions Intervention Not Indicated  Transportation Interventions Intervention Not Indicated       Antionette Fairy, RN,BSN,CCM RN Care Manager Transitions of Care  [place]Population [facility]  Direct Phone: [phone] Toll Free: [phone] Fax: [phone]

## 2023-01-27 NOTE — Transitions of Care (Post Inpatient/ED Visit) (Signed)
   01/27/2023  Name: Darin [name] MRN: [id number] DOB: 16-Feb-1932  Today's TOC FU Call Status: Today's TOC FU Call Status:: Unsuccessful Call (1st Attempt) Unsuccessful Call (1st Attempt) Date: 01/27/23  Attempted to reach the patient regarding the most recent Inpatient/ED visit.  Follow Up Plan: Additional outreach attempts will be made to reach the patient to complete the Transitions of Care (Post Inpatient/ED visit) call.    Antionette Fairy, RN,BSN,CCM RN Care Manager Transitions of Care  [place]  Direct Phone: [phone] Toll Free: [phone] Fax: [phone]

## 2023-01-27 NOTE — Telephone Encounter (Signed)
[facility] will be handling patient's palliative care

## 2023-01-28 ENCOUNTER — Encounter: Payer: Self-pay | Admitting: Family Medicine

## 2023-01-28 ENCOUNTER — Ambulatory Visit: Payer: Medicare Other | Admitting: Family Medicine

## 2023-01-28 VITALS — BP 108/50 | HR 64 | Temp 97.7°F | Ht 73.0 in

## 2023-01-28 DIAGNOSIS — I5043 Acute on chronic combined systolic (congestive) and diastolic (congestive) heart failure: Secondary | ICD-10-CM

## 2023-01-28 DIAGNOSIS — E032 Hypothyroidism due to medicaments and other exogenous substances: Secondary | ICD-10-CM

## 2023-01-28 DIAGNOSIS — D649 Anemia, unspecified: Secondary | ICD-10-CM | POA: Diagnosis not present

## 2023-01-28 DIAGNOSIS — I959 Hypotension, unspecified: Secondary | ICD-10-CM

## 2023-01-28 MED ORDER — MIDODRINE HCL 5 MG PO TABS: 5.0000 mg | ORAL_TABLET | Freq: Two times a day (BID) | ORAL | 1 refills | Status: DC

## 2023-01-28 MED ORDER — LEVOTHYROXINE SODIUM 25 MCG PO TABS: 25.0000 ug | ORAL_TABLET | Freq: Every day | ORAL | 3 refills | Status: DC

## 2023-01-28 NOTE — Progress Notes (Signed)
Established Patient Office Visit  Subjective   Patient ID: Darin [name], male    DOB: 19-May-1931  Age: 89 y.o. MRN: [id number]  Chief Complaint  Patient presents with   Hospitalization Follow-up    HPI   Darin [name] is seen for hospital follow-up.  He actually was admitted back in September with non-STEMI and declined left heart catheterization at that time.  He also has recent atrial fibrillation history and presented to the hospital recently with volume overload and was admitted October 29 and discharged November 2.  Was treated with IV pressors and admitted to ICU.  His echocardiogram back in September showed ejection fraction 20 to 25%.  Chest x-ray on recent admission showed some pulmonary vascular congestion.  CT scan showed bilateral pleural effusions and right inguinal hernia.  Right upper quadrant ultrasound showed no acute abnormalities.  There is slight nonspecific gallbladder wall thickening but no stones.  He denies any recent right upper quadrant abdominal pain.  He had issues with low blood pressure and was discharged on midodrine twice daily.  Very little ambulation now.  He does ambulate some with a walker and denies any dizziness or orthostatic changes. He is trying to follow fluid restriction of 1-1/2 L/day.  He is doing daily weights.  Weight has been stable since discharge.  Recent amiodarone therapy which has been discontinued.  He had elevated TSH of around 50.  Started on low-dose levothyroxine.  Cannot take ACE or ARB secondary to chronic kidney issues.  Patient states his home weight today was 215 pounds.  He has home health aide which is coming out regularly.  We discussed goals of care and he is resolute that he wishes to remain DNR.  Past Medical History:  Diagnosis Date   Arthritis    DUPUYTREN'S CONTRACTURE, RIGHT 07/04/2009   Annotation: 4th digit Qualifier: Diagnosis of  By: Lovell Sheehan MD, Balinda Quails    GERD (gastroesophageal reflux disease)    H/O hiatal  hernia    "FOR YEARS" " NO PROBLEM"   Hyperlipidemia    Hypertension    Knee pain    Neuropathy    PERIPHERAL    Osteoporosis    PMR (polymyalgia rheumatica) (HCC)    Polymyalgia (HCC) 5 YEARS   Past Surgical History:  Procedure Laterality Date   CARPAL TUNNEL RELEASE     CATARACT EXTRACTION     right   COLONOSCOPY  2007   INGUINAL HERNIA REPAIR  10/14/2011   Procedure: HERNIA REPAIR INGUINAL ADULT;  Surgeon: Shelly Rubenstein, MD;  Location: [facility] OR;  Service: General;  Laterality: Right;  Right Inguinal Hernia Repair with Mesh   TIBIA IM NAIL INSERTION Right 05/22/2021   Procedure: INTRAMEDULLARY (IM) NAIL TIBIAL;  Surgeon: Myrene Galas, MD;  Location: [facility] OR;  Service: Orthopedics;  Laterality: Right;   TONSILLECTOMY     VARICOSE VEIN SURGERY      reports that he has never smoked. He has never used smokeless tobacco. He reports that he does not currently use alcohol after a past usage of about 3.0 standard drinks of alcohol per week. He reports that he does not use drugs. family history includes Coronary artery disease in an other family member; Heart disease in his father. Allergies  Allergen Reactions   Statins Other (See Comments)    Muscle pain, upset stomach, and leg pain    Review of Systems  Constitutional:  Negative for chills, fever, malaise/fatigue and weight loss.  Eyes:  Negative for blurred vision.  Respiratory:  Negative for cough.   Cardiovascular:  Negative for chest pain.  Gastrointestinal:  Negative for abdominal pain.  Genitourinary:  Negative for dysuria.  Neurological:  Negative for dizziness, weakness and headaches.      Objective:     BP (!) 108/50 (BP Location: Left Arm, Patient Position: Sitting, Cuff Size: Normal)   Pulse 64   Temp 97.7 F (36.5 C) (Oral)   Ht 6\' 1"  (1.854 m)   SpO2 98%   BMI 29.49 kg/m  BP Readings from Last 3 Encounters:  01/28/23 (!) 108/50  01/25/23 (!) 102/38  12/12/22 112/68   Wt Readings from Last 3  Encounters:  01/25/23 223 lb 8.7 oz (101.4 kg)  12/06/22 197 lb 8.5 oz (89.6 kg)  10/09/22 198 lb (89.8 kg)      Physical Exam Vitals reviewed.  Constitutional:      Appearance: Normal appearance.  Cardiovascular:     Rate and Rhythm: Normal rate.     Comments: Irregular rhythm but rate controlled with low 60s Pulmonary:     Effort: Pulmonary effort is normal.     Breath sounds: No wheezing or rales.  Musculoskeletal:     Comments: Does have 1+ pitting edema lower legs bilaterally.  He has zip up support stockings on bilaterally.  Neurological:     Mental Status: He is alert.      No results found for any visits on 01/28/23.  Last CBC Lab Results  Component Value Date   WBC 5.8 01/25/2023   HGB 10.9 (L) 01/25/2023   HCT 33.7 (L) 01/25/2023   MCV 93.4 01/25/2023   MCH 30.2 01/25/2023   RDW 14.0 01/25/2023   PLT 220 01/25/2023   Last metabolic panel Lab Results  Component Value Date   GLUCOSE 112 (H) 01/25/2023   NA 134 (L) 01/25/2023   K 3.6 01/25/2023   CL 98 01/25/2023   CO2 27 01/25/2023   BUN 27 (H) 01/25/2023   CREATININE 1.36 (H) 01/25/2023   GFRNONAA 50 (L) 01/25/2023   CALCIUM 9.0 01/25/2023   PHOS 5.2 (H) 01/22/2023   PROT 5.7 (L) 01/23/2023   ALBUMIN 2.7 (L) 01/23/2023   BILITOT 0.5 01/23/2023   ALKPHOS 91 01/23/2023   AST 22 01/23/2023   ALT 42 01/23/2023   ANIONGAP 9 01/25/2023   Last thyroid functions Lab Results  Component Value Date   TSH 38.951 (H) 01/25/2023      The ASCVD Risk score (Arnett DK, et al., 2019) failed to calculate for the following reasons:   The 2019 ASCVD risk score is only valid for ages 2 to 24    Assessment & Plan:   #1 non-STEMI 9/24.  Patient declined left heart catheterization at that time opting for medical management.  No recent chest pains.  #2 heart failure with reduced ejection fraction with recent echo ejection fraction 20-25%.  Recent admission for cardiogenic shock.  Patient required pressors and  was discharged on midodrine.  Lowish blood pressure today but symptomatically stable.  Discharge weight stable. Continue daily weights Continue fluid restriction 1.5 L Be in touch for weight gain greater than 2 pounds per day or 4 to 5 pounds per week- -recheck renal function and electrolytes at follow-up  #3 atrial fibrillation.  Patient reportedly declined anticoagulants.  Rate stable.  #4 abnormal TSH.  Elevated TSH in the setting of recent amiodarone therapy.  Patient on low-dose levothyroxine.  Recheck TSH in about 6 weeks  #5 normocytic anemia.  Recheck CBC at  follow-up  #6 goals of care.  Patient is DNR and remains very comfortable in this decision.  Evelena Peat, MD

## 2023-01-28 NOTE — Patient Instructions (Signed)
Continue with daily weights and fluid restriction, as advised  Prescriptions have been sent to [facility].    Set up 6 week follow up.

## 2023-01-29 ENCOUNTER — Other Ambulatory Visit (HOSPITAL_COMMUNITY): Payer: Self-pay

## 2023-01-29 DIAGNOSIS — K4020 Bilateral inguinal hernia, without obstruction or gangrene, not specified as recurrent: Secondary | ICD-10-CM | POA: Diagnosis not present

## 2023-01-29 DIAGNOSIS — N179 Acute kidney failure, unspecified: Secondary | ICD-10-CM | POA: Diagnosis not present

## 2023-01-29 DIAGNOSIS — E785 Hyperlipidemia, unspecified: Secondary | ICD-10-CM | POA: Diagnosis not present

## 2023-01-29 DIAGNOSIS — I951 Orthostatic hypotension: Secondary | ICD-10-CM | POA: Diagnosis not present

## 2023-01-29 DIAGNOSIS — M353 Polymyalgia rheumatica: Secondary | ICD-10-CM | POA: Diagnosis not present

## 2023-01-29 DIAGNOSIS — I5043 Acute on chronic combined systolic (congestive) and diastolic (congestive) heart failure: Secondary | ICD-10-CM | POA: Diagnosis not present

## 2023-01-29 DIAGNOSIS — D649 Anemia, unspecified: Secondary | ICD-10-CM | POA: Diagnosis not present

## 2023-01-29 DIAGNOSIS — G63 Polyneuropathy in diseases classified elsewhere: Secondary | ICD-10-CM | POA: Diagnosis not present

## 2023-01-29 DIAGNOSIS — M5117 Intervertebral disc disorders with radiculopathy, lumbosacral region: Secondary | ICD-10-CM | POA: Diagnosis not present

## 2023-01-29 DIAGNOSIS — M810 Age-related osteoporosis without current pathological fracture: Secondary | ICD-10-CM | POA: Diagnosis not present

## 2023-01-29 DIAGNOSIS — I700 Atherosclerosis of aorta: Secondary | ICD-10-CM | POA: Diagnosis not present

## 2023-01-29 DIAGNOSIS — K5730 Diverticulosis of large intestine without perforation or abscess without bleeding: Secondary | ICD-10-CM | POA: Diagnosis not present

## 2023-01-29 DIAGNOSIS — I2722 Pulmonary hypertension due to left heart disease: Secondary | ICD-10-CM | POA: Diagnosis not present

## 2023-01-29 DIAGNOSIS — I081 Rheumatic disorders of both mitral and tricuspid valves: Secondary | ICD-10-CM | POA: Diagnosis not present

## 2023-01-29 DIAGNOSIS — M19049 Primary osteoarthritis, unspecified hand: Secondary | ICD-10-CM | POA: Diagnosis not present

## 2023-01-29 DIAGNOSIS — E039 Hypothyroidism, unspecified: Secondary | ICD-10-CM | POA: Diagnosis not present

## 2023-01-29 DIAGNOSIS — I110 Hypertensive heart disease with heart failure: Secondary | ICD-10-CM | POA: Diagnosis not present

## 2023-01-29 DIAGNOSIS — K219 Gastro-esophageal reflux disease without esophagitis: Secondary | ICD-10-CM | POA: Diagnosis not present

## 2023-01-29 DIAGNOSIS — I252 Old myocardial infarction: Secondary | ICD-10-CM | POA: Diagnosis not present

## 2023-01-29 DIAGNOSIS — I2510 Atherosclerotic heart disease of native coronary artery without angina pectoris: Secondary | ICD-10-CM | POA: Diagnosis not present

## 2023-01-29 DIAGNOSIS — M7062 Trochanteric bursitis, left hip: Secondary | ICD-10-CM | POA: Diagnosis not present

## 2023-01-29 DIAGNOSIS — I4892 Unspecified atrial flutter: Secondary | ICD-10-CM | POA: Diagnosis not present

## 2023-01-29 DIAGNOSIS — I4819 Other persistent atrial fibrillation: Secondary | ICD-10-CM | POA: Diagnosis not present

## 2023-01-29 DIAGNOSIS — E875 Hyperkalemia: Secondary | ICD-10-CM | POA: Diagnosis not present

## 2023-01-29 DIAGNOSIS — K449 Diaphragmatic hernia without obstruction or gangrene: Secondary | ICD-10-CM | POA: Diagnosis not present

## 2023-02-03 DIAGNOSIS — I081 Rheumatic disorders of both mitral and tricuspid valves: Secondary | ICD-10-CM | POA: Diagnosis not present

## 2023-02-03 DIAGNOSIS — I110 Hypertensive heart disease with heart failure: Secondary | ICD-10-CM | POA: Diagnosis not present

## 2023-02-03 DIAGNOSIS — N179 Acute kidney failure, unspecified: Secondary | ICD-10-CM | POA: Diagnosis not present

## 2023-02-03 DIAGNOSIS — I2722 Pulmonary hypertension due to left heart disease: Secondary | ICD-10-CM | POA: Diagnosis not present

## 2023-02-03 DIAGNOSIS — I5043 Acute on chronic combined systolic (congestive) and diastolic (congestive) heart failure: Secondary | ICD-10-CM | POA: Diagnosis not present

## 2023-02-03 DIAGNOSIS — E039 Hypothyroidism, unspecified: Secondary | ICD-10-CM | POA: Diagnosis not present

## 2023-02-04 ENCOUNTER — Other Ambulatory Visit: Payer: Self-pay

## 2023-02-04 ENCOUNTER — Encounter (HOSPITAL_COMMUNITY): Payer: Self-pay | Admitting: Emergency Medicine

## 2023-02-04 ENCOUNTER — Emergency Department (HOSPITAL_COMMUNITY): Payer: Medicare Other

## 2023-02-04 ENCOUNTER — Inpatient Hospital Stay (HOSPITAL_COMMUNITY)
Admission: EM | Admit: 2023-02-04 | Discharge: 2023-02-07 | DRG: 291 | Disposition: A | Discharge status: Home Health | Payer: Medicare Other | Attending: Internal Medicine | Admitting: Internal Medicine

## 2023-02-04 DIAGNOSIS — I213 ST elevation (STEMI) myocardial infarction of unspecified site: Secondary | ICD-10-CM | POA: Diagnosis not present

## 2023-02-04 DIAGNOSIS — Z79890 Hormone replacement therapy (postmenopausal): Secondary | ICD-10-CM

## 2023-02-04 DIAGNOSIS — I255 Ischemic cardiomyopathy: Secondary | ICD-10-CM | POA: Diagnosis present

## 2023-02-04 DIAGNOSIS — M353 Polymyalgia rheumatica: Secondary | ICD-10-CM | POA: Diagnosis present

## 2023-02-04 DIAGNOSIS — J9601 Acute respiratory failure with hypoxia: Secondary | ICD-10-CM | POA: Diagnosis not present

## 2023-02-04 DIAGNOSIS — E872 Acidosis, unspecified: Secondary | ICD-10-CM | POA: Diagnosis present

## 2023-02-04 DIAGNOSIS — K219 Gastro-esophageal reflux disease without esophagitis: Secondary | ICD-10-CM | POA: Diagnosis present

## 2023-02-04 DIAGNOSIS — Z8249 Family history of ischemic heart disease and other diseases of the circulatory system: Secondary | ICD-10-CM | POA: Diagnosis not present

## 2023-02-04 DIAGNOSIS — R062 Wheezing: Secondary | ICD-10-CM | POA: Diagnosis not present

## 2023-02-04 DIAGNOSIS — I4821 Permanent atrial fibrillation: Secondary | ICD-10-CM | POA: Diagnosis not present

## 2023-02-04 DIAGNOSIS — I2510 Atherosclerotic heart disease of native coronary artery without angina pectoris: Secondary | ICD-10-CM | POA: Diagnosis present

## 2023-02-04 DIAGNOSIS — I509 Heart failure, unspecified: Secondary | ICD-10-CM | POA: Diagnosis not present

## 2023-02-04 DIAGNOSIS — M810 Age-related osteoporosis without current pathological fracture: Secondary | ICD-10-CM | POA: Diagnosis present

## 2023-02-04 DIAGNOSIS — I4819 Other persistent atrial fibrillation: Secondary | ICD-10-CM | POA: Diagnosis present

## 2023-02-04 DIAGNOSIS — R0689 Other abnormalities of breathing: Secondary | ICD-10-CM | POA: Diagnosis not present

## 2023-02-04 DIAGNOSIS — E875 Hyperkalemia: Secondary | ICD-10-CM | POA: Diagnosis present

## 2023-02-04 DIAGNOSIS — N1832 Chronic kidney disease, stage 3b: Secondary | ICD-10-CM | POA: Diagnosis present

## 2023-02-04 DIAGNOSIS — Z79899 Other long term (current) drug therapy: Secondary | ICD-10-CM | POA: Diagnosis not present

## 2023-02-04 DIAGNOSIS — I4892 Unspecified atrial flutter: Secondary | ICD-10-CM | POA: Diagnosis present

## 2023-02-04 DIAGNOSIS — N400 Enlarged prostate without lower urinary tract symptoms: Secondary | ICD-10-CM | POA: Diagnosis present

## 2023-02-04 DIAGNOSIS — E039 Hypothyroidism, unspecified: Secondary | ICD-10-CM | POA: Diagnosis present

## 2023-02-04 DIAGNOSIS — I130 Hypertensive heart and chronic kidney disease with heart failure and stage 1 through stage 4 chronic kidney disease, or unspecified chronic kidney disease: Secondary | ICD-10-CM | POA: Diagnosis not present

## 2023-02-04 DIAGNOSIS — E785 Hyperlipidemia, unspecified: Secondary | ICD-10-CM | POA: Diagnosis present

## 2023-02-04 DIAGNOSIS — E871 Hypo-osmolality and hyponatremia: Secondary | ICD-10-CM | POA: Diagnosis not present

## 2023-02-04 DIAGNOSIS — R0989 Other specified symptoms and signs involving the circulatory and respiratory systems: Secondary | ICD-10-CM | POA: Diagnosis not present

## 2023-02-04 DIAGNOSIS — Z66 Do not resuscitate: Secondary | ICD-10-CM | POA: Diagnosis not present

## 2023-02-04 DIAGNOSIS — J9811 Atelectasis: Secondary | ICD-10-CM | POA: Diagnosis not present

## 2023-02-04 DIAGNOSIS — I252 Old myocardial infarction: Secondary | ICD-10-CM

## 2023-02-04 DIAGNOSIS — Z7902 Long term (current) use of antithrombotics/antiplatelets: Secondary | ICD-10-CM

## 2023-02-04 DIAGNOSIS — I110 Hypertensive heart disease with heart failure: Secondary | ICD-10-CM | POA: Diagnosis not present

## 2023-02-04 DIAGNOSIS — R531 Weakness: Secondary | ICD-10-CM | POA: Diagnosis not present

## 2023-02-04 DIAGNOSIS — R000 Tachycardia, unspecified: Secondary | ICD-10-CM | POA: Diagnosis not present

## 2023-02-04 DIAGNOSIS — I2729 Other secondary pulmonary hypertension: Secondary | ICD-10-CM | POA: Diagnosis present

## 2023-02-04 DIAGNOSIS — Z7401 Bed confinement status: Secondary | ICD-10-CM | POA: Diagnosis not present

## 2023-02-04 DIAGNOSIS — R0603 Acute respiratory distress: Secondary | ICD-10-CM | POA: Diagnosis not present

## 2023-02-04 DIAGNOSIS — I5021 Acute systolic (congestive) heart failure: Secondary | ICD-10-CM

## 2023-02-04 DIAGNOSIS — I5023 Acute on chronic systolic (congestive) heart failure: Secondary | ICD-10-CM | POA: Diagnosis present

## 2023-02-04 DIAGNOSIS — G629 Polyneuropathy, unspecified: Secondary | ICD-10-CM | POA: Diagnosis present

## 2023-02-04 DIAGNOSIS — R0902 Hypoxemia: Secondary | ICD-10-CM | POA: Diagnosis not present

## 2023-02-04 DIAGNOSIS — Z7409 Other reduced mobility: Secondary | ICD-10-CM | POA: Diagnosis present

## 2023-02-04 DIAGNOSIS — R0602 Shortness of breath: Secondary | ICD-10-CM | POA: Diagnosis not present

## 2023-02-04 DIAGNOSIS — I4811 Longstanding persistent atrial fibrillation: Secondary | ICD-10-CM | POA: Diagnosis not present

## 2023-02-04 HISTORY — DX: Chronic systolic (congestive) heart failure: I50.22

## 2023-02-04 LAB — CBC WITH DIFFERENTIAL/PLATELET
Abs Immature Granulocytes: 0.05 10*3/uL (ref 0.00–0.07)
Basophils Absolute: 0.1 10*3/uL (ref 0.0–0.1)
Basophils Relative: 0 %
Eosinophils Absolute: 0 10*3/uL (ref 0.0–0.5)
Eosinophils Relative: 0 %
HCT: 41.3 % (ref 39.0–52.0)
Hemoglobin: 12.7 g/dL — ABNORMAL LOW (ref 13.0–17.0)
Immature Granulocytes: 0 %
Lymphocytes Relative: 5 %
Lymphs Abs: 0.6 10*3/uL — ABNORMAL LOW (ref 0.7–4.0)
MCH: 30.5 pg (ref 26.0–34.0)
MCHC: 30.8 g/dL (ref 30.0–36.0)
MCV: 99 fL (ref 80.0–100.0)
Monocytes Absolute: 0.6 10*3/uL (ref 0.1–1.0)
Monocytes Relative: 5 %
Neutro Abs: 9.9 10*3/uL — ABNORMAL HIGH (ref 1.7–7.7)
Neutrophils Relative %: 90 %
Platelets: 295 10*3/uL (ref 150–400)
RBC: 4.17 MIL/uL — ABNORMAL LOW (ref 4.22–5.81)
RDW: 14.7 % (ref 11.5–15.5)
WBC: 11.2 10*3/uL — ABNORMAL HIGH (ref 4.0–10.5)
nRBC: 0 % (ref 0.0–0.2)

## 2023-02-04 LAB — BASIC METABOLIC PANEL
Anion gap: 13 (ref 5–15)
BUN: 30 mg/dL — ABNORMAL HIGH (ref 8–23)
CO2: 22 mmol/L (ref 22–32)
Calcium: 9.4 mg/dL (ref 8.9–10.3)
Chloride: 102 mmol/L (ref 98–111)
Creatinine, Ser: 1.36 mg/dL — ABNORMAL HIGH (ref 0.61–1.24)
GFR, Estimated: 50 mL/min — ABNORMAL LOW (ref >60)
Glucose, Bld: 193 mg/dL — ABNORMAL HIGH (ref 70–99)
Potassium: 4.6 mmol/L (ref 3.5–5.1)
Sodium: 137 mmol/L (ref 135–145)

## 2023-02-04 LAB — I-STAT CG4 LACTIC ACID, ED
Lactic Acid, Venous: 4.7 mmol/L (ref 0.5–1.9)
Lactic Acid, Venous: 4.8 mmol/L (ref 0.5–1.9)

## 2023-02-04 LAB — HEPATIC FUNCTION PANEL
ALT: 21 U/L (ref 0–44)
AST: 22 U/L (ref 15–41)
Albumin: 3.2 g/dL — ABNORMAL LOW (ref 3.5–5.0)
Alkaline Phosphatase: 103 U/L (ref 38–126)
Bilirubin, Direct: <0.1 mg/dL (ref 0.0–0.2)
Total Bilirubin: 0.4 mg/dL (ref <1.2)
Total Protein: 7.2 g/dL (ref 6.5–8.1)

## 2023-02-04 LAB — T4, FREE: Free T4: 0.78 ng/dL (ref 0.61–1.12)

## 2023-02-04 LAB — SARS CORONAVIRUS 2 BY RT PCR: SARS Coronavirus 2 by RT PCR: NEGATIVE

## 2023-02-04 LAB — TSH: TSH: 46.641 u[IU]/mL — ABNORMAL HIGH (ref 0.350–4.500)

## 2023-02-04 LAB — TROPONIN I (HIGH SENSITIVITY)
Troponin I (High Sensitivity): 16 ng/L (ref <18)
Troponin I (High Sensitivity): 21 ng/L — ABNORMAL HIGH (ref <18)

## 2023-02-04 LAB — BRAIN NATRIURETIC PEPTIDE: B Natriuretic Peptide: 715.8 pg/mL — ABNORMAL HIGH (ref 0.0–100.0)

## 2023-02-04 MED ORDER — ACETAMINOPHEN 325 MG PO TABS: 650.0000 mg | ORAL_TABLET | Freq: Four times a day (QID) | ORAL | Status: DC | PRN

## 2023-02-04 MED ORDER — LEVOTHYROXINE SODIUM 25 MCG PO TABS
25.0000 ug | ORAL_TABLET | Freq: Every day | ORAL | Status: DC
Start: 2023-02-05 — End: 2023-02-05
  Administered 2023-02-05: 25 ug via ORAL
  Filled 2023-02-04: qty 1

## 2023-02-04 MED ORDER — ALBUTEROL SULFATE (2.5 MG/3ML) 0.083% IN NEBU
10.0000 mg/h | INHALATION_SOLUTION | Freq: Once | RESPIRATORY_TRACT | Status: AC
  Administered 2023-02-04: 10 mg/h via RESPIRATORY_TRACT
  Filled 2023-02-04: qty 12

## 2023-02-04 MED ORDER — HEPARIN SODIUM (PORCINE) 5000 UNIT/ML IJ SOLN
5000.0000 [IU] | Freq: Three times a day (TID) | INTRAMUSCULAR | Status: DC
  Administered 2023-02-04 – 2023-02-07 (×9): 5000 [IU] via SUBCUTANEOUS
  Filled 2023-02-04 (×9): qty 1

## 2023-02-04 MED ORDER — HYDRALAZINE HCL 20 MG/ML IJ SOLN: 10.0000 mg | Freq: Four times a day (QID) | INTRAMUSCULAR | Status: DC | PRN

## 2023-02-04 MED ORDER — GABAPENTIN 100 MG PO CAPS
200.0000 mg | ORAL_CAPSULE | Freq: Two times a day (BID) | ORAL | Status: DC
  Administered 2023-02-04 – 2023-02-07 (×7): 200 mg via ORAL
  Filled 2023-02-04 (×7): qty 2

## 2023-02-04 MED ORDER — CLOPIDOGREL BISULFATE 75 MG PO TABS
75.0000 mg | ORAL_TABLET | Freq: Every day | ORAL | Status: DC
  Administered 2023-02-04 – 2023-02-07 (×4): 75 mg via ORAL
  Filled 2023-02-04 (×4): qty 1

## 2023-02-04 MED ORDER — FUROSEMIDE 10 MG/ML IJ SOLN
60.0000 mg | Freq: Once | INTRAMUSCULAR | Status: AC
  Administered 2023-02-04: 60 mg via INTRAVENOUS
  Filled 2023-02-04 (×2): qty 6

## 2023-02-04 MED ORDER — DIGOXIN 125 MCG PO TABS
0.1250 mg | ORAL_TABLET | Freq: Every day | ORAL | Status: DC
  Administered 2023-02-05 – 2023-02-07 (×3): 0.125 mg via ORAL
  Filled 2023-02-04 (×3): qty 1

## 2023-02-04 MED ORDER — EZETIMIBE 10 MG PO TABS
10.0000 mg | ORAL_TABLET | Freq: Every day | ORAL | Status: DC
  Administered 2023-02-04 – 2023-02-07 (×4): 10 mg via ORAL
  Filled 2023-02-04 (×4): qty 1

## 2023-02-04 MED ORDER — POLYETHYLENE GLYCOL 3350 17 G PO PACK
17.0000 g | PACK | Freq: Two times a day (BID) | ORAL | Status: DC | PRN
  Administered 2023-02-05: 17 g via ORAL
  Filled 2023-02-04: qty 1

## 2023-02-04 MED ORDER — FUROSEMIDE 10 MG/ML IJ SOLN
40.0000 mg | Freq: Two times a day (BID) | INTRAMUSCULAR | Status: DC
  Administered 2023-02-04 – 2023-02-06 (×4): 40 mg via INTRAVENOUS
  Filled 2023-02-04 (×4): qty 4

## 2023-02-04 MED ORDER — ALBUTEROL SULFATE (2.5 MG/3ML) 0.083% IN NEBU
2.5000 mg | INHALATION_SOLUTION | Freq: Four times a day (QID) | RESPIRATORY_TRACT | Status: DC | PRN
Start: 2023-02-04 — End: 2023-02-07

## 2023-02-04 MED ORDER — METOPROLOL SUCCINATE ER 25 MG PO TB24
25.0000 mg | ORAL_TABLET | Freq: Every day | ORAL | Status: DC
  Administered 2023-02-04 – 2023-02-07 (×4): 25 mg via ORAL
  Filled 2023-02-04 (×4): qty 1

## 2023-02-04 MED ORDER — FINASTERIDE 5 MG PO TABS
5.0000 mg | ORAL_TABLET | Freq: Every day | ORAL | Status: DC
  Administered 2023-02-04 – 2023-02-07 (×4): 5 mg via ORAL
  Filled 2023-02-04 (×4): qty 1

## 2023-02-04 MED ORDER — ALBUTEROL SULFATE HFA 108 (90 BASE) MCG/ACT IN AERS
2.0000 | INHALATION_SPRAY | RESPIRATORY_TRACT | Status: DC | PRN
Start: 2023-02-04 — End: 2023-02-04

## 2023-02-04 MED ORDER — ACETAMINOPHEN 650 MG RE SUPP: 650.0000 mg | Freq: Four times a day (QID) | RECTAL | Status: DC | PRN

## 2023-02-04 MED ORDER — DIGOXIN 125 MCG PO TABS
0.2500 mg | ORAL_TABLET | ORAL | Status: AC
  Administered 2023-02-04 (×2): 0.25 mg via ORAL
  Filled 2023-02-04 (×2): qty 2

## 2023-02-04 NOTE — ED Notes (Signed)
Help get patient cleaned up sheets changed placed a brief and put a condom cath on patient is resting with call bell in reach

## 2023-02-04 NOTE — Consult Note (Addendum)
Cardiology Consultation   Patient ID: Darin [name] MRN: [id number]; DOB: [birth year]  Admit date: 02/04/2023 Date of Consult: 02/04/2023  PCP:  [name], MD   [facility] Providers Cardiologist: Patient requested Dr. Gevena Mart here to update MD or APP on Care Team, Refresh:1}     Patient Profile:   Darin [name] is a 89 y.o. male with a hx of HTN, HLD, chronic HFrEF, mitral regurgitation, tricuspid regurgitation, secondary pulmonary hypertension, presumed CAD with prior medically managed NSTEMIs, persistent atrial fib/flutter, bradycardia, carotid artery disease (40-59% BICA 04/2021), neuropathy, BPH, GERD, normocytic anemia, polymyalgia rheumatica who is being seen 02/04/2023 for the evaluation of CHF at the request of Dr. Pola Corn.  History of Present Illness:   Darin [name] was hospitalized in 11/2022 with SOB/NSTEMI (troponin 2250), found to have decreased EF to 20-25% with moderate MR, moderate-severe TR. (EF previously 70-75% in 04/2021.) He was seen by Dr. Algie Coffer during that admission. Patient declined cardiac catheterization. He was in afib/flutter prompting amiodarone with PO load at DC. Dr. Algie Coffer added Plavix to regimen as well. The patient was readmitted 10/29-11/2 with nausea and diaphoresis, found to have cardiogenic shock, AKI, hyperkalemia and required pressors in ICU. HeartCare was consulted. Benazepril was discontinued. He was also found to have elevated TSH (mildly up prior to [facility] but worse), elevated LFTs, and bradycardia therefore amiodarone was discontinued, remaining in rate controlled afib at DC. The elevated LFTs were ultimately felt likely secondary to hepatic congestion in setting of heart failure. Once stabilized, he was diuresed. The patient declined DOAC. GDMT otherwise limited due to hypotension requiring midodrine at DC. He was seen by palliative care and elected to remain DNR, treat the treatable, and follow with palliative as  outpatient. The patient requested to f/u with Dr. Royann Shivers going forward and appt was planned later this month.  The patient returned to the hospital early this AM BIBEMS with SOB. He reports over the weekend he felt fine but around 11pm last night began to develop worsening SOB, orthopnea, and edema. He tried to "tough it out" but symptoms progressed so EMS was called. He was hypoxic in the 70s-80s despite [place] O2 so was upgraded to BiPAP. He was in AF (At times flutter appearing) with RVR on arrival. Lactic acid 4.7. BNP 715. hsTroponin 16-21, Cr 1.36 stable from prior, WBC 11.2, Hgb 12.7, improved from prior, TSH remains up at 46 with normal FT4. Covid neg. CXR favors  congestive heart failure/pulmonary edema characterized by diffuse pulmonary vascular congestion and bilateral pleural effusions. He received IV steroids, nebs, 60mg  IV Lasix. He reports with the IV Lasix he has urinated out "4 gallons," I/O's not yet tracked in ED. He has had mild improvement in SOB. He has not had any overt chest pain or palpittaions. He had noticed a dropoff in UOP the day leading up to admission. He reports adherence with medications.     Past Medical History:  Diagnosis Date   Arthritis    DUPUYTREN'S CONTRACTURE, RIGHT 07/04/2009   Annotation: 4th digit Qualifier: Diagnosis of  By: Lovell Sheehan MD, Balinda Quails    GERD (gastroesophageal reflux disease)    H/O hiatal hernia    "FOR YEARS" " NO PROBLEM"   Hyperlipidemia    Hypertension    Knee pain    Neuropathy    PERIPHERAL    Osteoporosis    PMR (polymyalgia rheumatica) (HCC)    Polymyalgia (HCC) 5 YEARS    Past Surgical History:  Procedure Laterality Date  CARPAL TUNNEL RELEASE     CATARACT EXTRACTION     right   COLONOSCOPY  2007   INGUINAL HERNIA REPAIR  10/14/2011   Procedure: HERNIA REPAIR INGUINAL ADULT;  Surgeon: Shelly Rubenstein, MD;  Location: [facility] OR;  Service: General;  Laterality: Right;  Right Inguinal Hernia Repair with Mesh   TIBIA IM NAIL  INSERTION Right 05/22/2021   Procedure: INTRAMEDULLARY (IM) NAIL TIBIAL;  Surgeon: Myrene Galas, MD;  Location: [facility] OR;  Service: Orthopedics;  Laterality: Right;   TONSILLECTOMY     VARICOSE VEIN SURGERY       Home Medications:  Prior to Admission medications   Medication Sig Start Date End Date Taking? Authorizing Provider  clopidogrel (PLAVIX) 75 MG tablet Take 1 tablet (75 mg total) by mouth daily. 12/06/22   Almon Hercules, MD  ezetimibe (ZETIA) 10 MG tablet Take 1 tablet (10 mg total) by mouth daily. 12/07/22   Almon Hercules, MD  famotidine (PEPCID) 20 MG tablet TAKE 1 TABLET AT BEDTIME 11/11/22   Burchette, Elberta Fortis, MD  finasteride (PROSCAR) 5 MG tablet TAKE ONE TABLET BY MOUTH ONCE DAILY 01/15/23   Burchette, Elberta Fortis, MD  gabapentin (NEURONTIN) 100 MG capsule Take 2 capsules in AM and 2 in PM 01/17/23   Burchette, Elberta Fortis, MD  levothyroxine (SYNTHROID) 25 MCG tablet Take 1 tablet (25 mcg total) by mouth daily before breakfast. 01/28/23   Burchette, Elberta Fortis, MD  metoprolol succinate (TOPROL-XL) 25 MG 24 hr tablet Take 1 tablet (25 mg total) by mouth daily. 12/07/22   Almon Hercules, MD  metoprolol succinate (TOPROL-XL) 50 MG 24 hr tablet Take 50 mg by mouth daily. 12/16/22   [provider]  midodrine (PROAMATINE) 5 MG tablet Take 1 tablet (5 mg total) by mouth 2 (two) times daily with a meal. 01/28/23   Burchette, Elberta Fortis, MD  polyethylene glycol (MIRALAX / GLYCOLAX) packet Take 17 g by mouth daily as needed for moderate constipation. Patient taking differently: Take 17 g by mouth 2 (two) times daily. 03/16/17   Glade Lloyd, MD  torsemide (DEMADEX) 20 MG tablet Take 1 tablet (20 mg total) by mouth daily. 12/06/22 03/06/23  Almon Hercules, MD    Inpatient Medications: Scheduled Meds:  clopidogrel  75 mg Oral Daily   ezetimibe  10 mg Oral Daily   finasteride  5 mg Oral Daily   furosemide  40 mg Intravenous BID   gabapentin  200 mg Oral BID   heparin  5,000 Units Subcutaneous Q8H    [START ON 02/05/2023] levothyroxine  25 mcg Oral QAC breakfast   metoprolol succinate  25 mg Oral Daily   Continuous Infusions:  PRN Meds: acetaminophen **OR** acetaminophen, albuterol, hydrALAZINE  Allergies:    Allergies  Allergen Reactions   Statins Other (See Comments)    Muscle pain, upset stomach, and leg pain    Social History:   Social History   Socioeconomic History   Marital status: Widowed    Spouse name: Not on file   Number of children: Not on file   Years of education: Not on file   Highest education level: Master's degree (e.g., MA, MS, MEng, MEd, MSW, MBA)  Occupational History   Occupation: retired    Associate Professor: RETIRED  Tobacco Use   Smoking status: Never   Smokeless tobacco: Never  Vaping Use   Vaping status: Never Used  Substance and Sexual Activity   Alcohol use: Not Currently    Alcohol/week: 3.0  standard drinks of alcohol    Types: 3 Glasses of wine per week    Comment: half glass wine before dinner   Drug use: No   Sexual activity: Not Currently  Other Topics Concern   Not on file  Social History Narrative   Not on file   Social Determinants of Health   Financial Resource Strain: Low Risk  (08/30/2022)   Overall Financial Resource Strain (CARDIA)    Difficulty of Paying Living Expenses: Not hard at all  Food Insecurity: No Food Insecurity (01/27/2023)   Hunger Vital Sign    Worried About Running Out of Food in the Last Year: Never true    Ran Out of Food in the Last Year: Never true  Transportation Needs: No Transportation Needs (01/27/2023)   PRAPARE - Administrator, Civil Service (Medical): No    Lack of Transportation (Non-Medical): No  Physical Activity: Sufficiently Active (08/30/2022)   Exercise Vital Sign    Days of Exercise per Week: 7 days    Minutes of Exercise per Session: 40 min  Stress: No Stress Concern Present (08/30/2022)   [employer] of Occupational Health - Occupational Stress Questionnaire     Feeling of Stress : Not at all  Social Connections: Moderately Integrated (08/30/2022)   Social Connection and Isolation Panel [NHANES]    Frequency of Communication with Friends and Family: More than three times a week    Frequency of Social Gatherings with Friends and Family: More than three times a week    Attends Religious Services: More than 4 times per year    Active Member of [employer] or Organizations: Yes    Attends Banker Meetings: More than 4 times per year    Marital Status: Widowed  Intimate Partner Violence: Not At Risk (01/21/2023)   Humiliation, Afraid, Rape, and Kick questionnaire    Fear of Current or Ex-Partner: No    Emotionally Abused: No    Physically Abused: No    Sexually Abused: No    Family History:   Family History  Problem Relation Age of Onset   Heart disease Father    Coronary artery disease Other      ROS:  Please see the history of present illness.   All other ROS reviewed and negative.     Physical Exam/Data:   Vitals:   02/04/23 1102 02/04/23 1115 02/04/23 1130 02/04/23 1139  BP:  124/73 137/80   Pulse: (!) 125 (!) 113 (!) 123   Resp: (!) 25  (!) 25   Temp:    97.6 F (36.4 C)  TempSrc:      SpO2:  100% 100%   Weight:      Height:       No intake or output data in the 24 hours ending 02/04/23 1421    02/04/2023    6:25 AM 01/25/2023    4:52 AM 01/24/2023    4:42 AM  Last 3 Weights  Weight (lbs) 215 lb 223 lb 8.7 oz 223 lb 5.2 oz  Weight (kg) 97.523 kg 101.4 kg 101.3 kg     Body mass index is 28.37 kg/m.  General: Well developed, well nourished, in no acute distress though mild dyspnea with longer sentences Head: Normocephalic, atraumatic, sclera non-icteric, no xanthomas, nares are without discharge. Neck: Negative for carotid bruits. JVP not elevated. Lungs: Diffusely coarse without obvious wheezing or rhonchi. Breathing is unlabored. Heart: Irregularly irregular, S1 S2 without murmurs, rubs, or gallops.  Abdomen:  Soft, non-tender, non-distended with normoactive bowel sounds. No rebound/guarding. Extremities: No clubbing or cyanosis. Soft mild pale puffy BLE edema Neuro: Alert and oriented X 3. Moves all extremities spontaneously. Psych:  Responds to questions appropriately with a normal affect.   EKG:  The EKG was personally reviewed and demonstrates:  atrial fibrillation 116bpm, diffuse nonspecific STW changes with ST depression inferiorly and V4-V6 Telemetry:  Telemetry was personally reviewed and demonstrates:  atrial fib RVR low 100s presently  Relevant CV Studies: 2d echo 11/2022   1. Left ventricular ejection fraction, by estimation, is 20 to 25%. The  left ventricle has severely decreased function. The left ventricle  demonstrates regional wall motion abnormalities (see scoring  diagram/findings for description). Left ventricular  diastolic parameters are consistent with Grade I diastolic dysfunction  (impaired relaxation).   2. Right ventricular systolic function is mildly reduced. The right  ventricular size is normal. There is moderately elevated pulmonary artery  systolic pressure.   3. Left atrial size was mildly dilated.   4. Right atrial size was moderately dilated.   5. The mitral valve is degenerative. Moderate mitral valve regurgitation.   6. Tricuspid valve regurgitation is moderate to severe.   7. The aortic valve is tricuspid. There is moderate calcification of the  aortic valve. There is mild thickening of the aortic valve. Aortic valve  regurgitation is mild. Aortic valve sclerosis/calcification is present,  without any evidence of aortic  stenosis.   8. The inferior vena cava is dilated in size with <50% respiratory  variability, suggesting right atrial pressure of 15 mmHg.   Conclusion(s)/Recommendation(s): Findings consistent with ischemic  cardiomyopathy.    Laboratory Data:  High Sensitivity Troponin:   Recent Labs  Lab 01/21/23 1704 01/21/23 2217  02/04/23 0802 02/04/23 1024  TROPONINIHS 10 11 16  21*     Chemistry Recent Labs  Lab 02/04/23 0802  NA 137  K 4.6  CL 102  CO2 22  GLUCOSE 193*  BUN 30*  CREATININE 1.36*  CALCIUM 9.4  GFRNONAA 50*  ANIONGAP 13    No results for input(s): "PROT", "ALBUMIN", "AST", "ALT", "ALKPHOS", "BILITOT" in the last 168 hours. Lipids No results for input(s): "CHOL", "TRIG", "HDL", "LABVLDL", "LDLCALC", "CHOLHDL" in the last 168 hours.  Hematology Recent Labs  Lab 02/04/23 0802  WBC 11.2*  RBC 4.17*  HGB 12.7*  HCT 41.3  MCV 99.0  MCH 30.5  MCHC 30.8  RDW 14.7  PLT 295   Thyroid  Recent Labs  Lab 02/04/23 0802  TSH 46.641*  FREET4 0.78    BNP Recent Labs  Lab 02/04/23 0802  BNP 715.8*    DDimer No results for input(s): "DDIMER" in the last 168 hours.   Radiology/Studies:  DG Chest 2 View  Result Date: 02/04/2023 CLINICAL DATA:  Shortness of breath. EXAM: CHEST - 2 VIEW COMPARISON:  01/21/2023. FINDINGS: There is diffuse pulmonary vascular congestion with bilateral hilar and bibasilar predominance. There is additional left retrocardiac airspace opacity obscuring the left hemidiaphragm, descending thoracic aorta and blunting the left lateral costophrenic angle suggesting combination of left lower lobe atelectasis and/or consolidation with pleural effusion. There are also patchy areas of scarring/atelectasis at the right lung base. Bilateral lung fields are otherwise clear. There is also small right pleural effusion. Stable cardio-mediastinal silhouette. No acute osseous abnormalities. The soft tissues are within normal limits. IMPRESSION: *Findings favor congestive heart failure/pulmonary edema characterized by diffuse pulmonary vascular congestion and bilateral pleural effusions. Electronically Signed   By: Timoteo Expose.D.  On: 02/04/2023 09:13     Assessment and Plan:   1. Acute hypoxic respiratory failure suspected due to acute on chronic HFrEF - recent  readmission pattern is concerning, and he continues to have lactic acidosis at this time - agree with IV Lasix as ordered - hold on BB titration given acute exacerbation - medical therapy options have been limited - hypotension precluded aggressive GDMT last admission, also had AKI and hyperkalemia with ACEI in setting of cardiogenic shock - will review plans with MD including question of continuation of midodrine  2. Suspected underlying CAD with minimal troponin elevation - patient previously declined cath therefore medical therapy pursued, would continue this course - on Plavix due to this - h/o intolerance of statins, also held last admission due to elevated LFTs - on Zetia  3. Persistent atrial fibrillation/flutter here with RVR - dx 11/2022 managed with rate control strategy, bradycardia 12/2022 prompting dc of amiodarone, patient has declined DOAC - will review options with MD - in setting of acute HF, may not be wise to aggressively titrate BB. Question whether re-trial of amiodarone at lower dose for rate control would be warranted  4. CKD 3a - Cr near baseline  5. Moderate MR, moderate-severe TR by echo 11/2022 - following in context of above  6. Hypothyroidism - per IM   Risk Assessment/Risk Scores:        New York Heart Association (NYHA) Functional Class NYHA Class IV  CHA2DS2-VASc Score = 5   This indicates a 7.2% annual risk of stroke. The patient's score is based upon: CHF History: 1 HTN History: 1 Diabetes History: 0 Stroke History: 0 Vascular Disease History: 1 Age Score: 2 Gender Score: 0         For questions or updates, please contact [facility] Please consult [email or web address] for contact info under    Signed, Laurann Montana, PA-C  02/04/2023 2:21 PM  I have seen and examined the patient along with Laurann Montana, PA-C .  I have reviewed the chart, notes and new data.  I agree with PA/NP's note.  Key new complaints: His dyspnea and  wheezing have improved substantially since being admitted and he has had good urine output (although this is not well-documented). Key examination changes: Has some 1-2+ ankle edema bilaterally, but there is significant wrinkling of the skin suggesting edema was worse, jugular veins appear to be elevated about 6-8 cm and there are even more prominent V waves, he does appear to have abdominal distention.  Irregular rhythm, holosystolic murmur at the right lower sternal border. Key new findings / data: Note elevated lactic acid level and elevated BNP higher than previous levels, normal electrolytes, moderately abnormal renal function with an estimated GFR around 50.  Chest x-ray consistent with heart failure and pulmonary edema.  Coronary and aortic atherosclerotic calcifications are seen on chest and abdominal CT.  ECG shows atrial fibrillation with mild RVR and prominent ST-T changes suggestive of ischemia.  The echocardiogram shows severely depressed left ventricular systolic function with regional wall motion abnormalities consistent with the previous extensive anterior wall myocardial infarction with anteroapical dyskinesis.  PLAN:  Darin [name] has end-stage ischemic cardiomyopathy with severely depressed left ventricular systolic function and acute exacerbation of chronic systolic heart failure in the setting of atrial fibrillation with rapid ventricular response.  Elevated lactic acid suggests a component of reduced cardiac output/cardiogenic shock, although he is maintaining reasonably good renal function and has normal blood pressure at this time.  Prognosis  is poor.  We had a discussion regarding his expectations and goals.  Darin [name] is very intelligent and educated and has excellent cognitive function.  He makes it clear that he is interested in maintaining some degree of autonomy and quality of life.  He does not want to undergo any invasive procedures or take medications that do not have direct  impact on quality of life.  He wants to die at home.  Therefore I agree with his decision not to take anticoagulants.  Although his yearly risk of ischemic stroke is possibly as high as 15%, his overall life expectancy is short and he is more likely to suffer complications from the anticoagulants than benefit from them.  It is quite likely that he has multivessel coronary artery disease and an occluded LAD artery and he is not a candidate for surgical revascularization.  As such, I also believe that his decision not to undergo invasive procedures is the correct one.  So far he has responded well to diuretics.  We will not be able to achieve better rate control with a higher dose of beta-blocker, without causing additional problems of reduced cardiac output or hypotension.  I think this is one of the few situations in which using digoxin is justified. It may have some positive impact on quality of life, even taking into account the risk of toxicity.  Continue diuretics, add digoxin.  Monitor electrolytes.  Focus on interventions to improve quality of life.  Overall palliative care approach.  Thurmon Fair, MD, [facility] [phone] 02/04/2023, 5:16 PM   .

## 2023-02-04 NOTE — ED Notes (Signed)
Patient Stats @ 78-88 on 3liters via [place]. O2 titrated to 5liters patient continues to have stats 78-88%. MD made aware and order bipap. RT called

## 2023-02-04 NOTE — ED Notes (Signed)
Lactic 4.7, Dahal MD made aware

## 2023-02-04 NOTE — ED Triage Notes (Signed)
Pt BIB EMS from home with c/o [facility] since last night. Has some pitting edema in BLE per ems  RR 33  125mg  solumedrol Duoneb x2

## 2023-02-04 NOTE — H&P (Signed)
[facility] History and Physical  Xavy Rambin MVH:[id number] DOB: Aug 06, 1931 DOA: 02/04/2023 PCP: [name], MD  Presented from: Home Chief Complaint: Shortness of breath  History of Present Illness: Darin [name] is a 89 y.o. male with PMH significant for HTN, HLD, chronic systolic CHF EF 20 to 25%, persistent A-fib, CHF, neuropathy, polymyalgia rheumatica who was recently hospitalized 3 weeks ago for cardiogenic shock, medically managed, optimized and discharged home on GDMT. Patient was brought to the ED from home by EMS early this morning with complaint of shortness of breath since last night.  Also has progressively worsening bilateral lower extremity edema.  In the ED, patient was tachycardic to 110s, tachypneic to 20s requiring 8 L oxygen by nasal cannula. Patient oxygen saturation dropped down to 70s and later he was put on BiPAP. Labs with WC count 11.2, BNP 716, BUN/creatinine 30/1.36 Chest x-ray showed diffuse pulm edema, pulmonary vascular congestion, bilateral pleural effusion suggestive of CHF EKG at A-fib with RVR at the rate of 116 bpm He was given 1 dose of IV Lasix 60 mg. Hospitalist service was consulted for inpatient medical management  At the time of my evaluation, patient was alert, awake, on BiPAP.  Per RN, they tried to remove BiPAP earlier but patient's O2 sat dropped and hence they put him back on BiPAP.  Conversation was limited because of BiPAP patient is oriented and is able to answer questions. No family at bedside.  He confirms DNR/DNI status.  Lives alone.  Uses a walker. Reports compliance to regular meds including Lasix. I ordered for lactic acid level which came elevated at 4.7. I called cardiology consultation.   Review of Systems:  All systems were reviewed and were negative unless otherwise mentioned in the HPI   Past medical history: Past Medical History:  Diagnosis Date   Arthritis    DUPUYTREN'S CONTRACTURE, RIGHT  07/04/2009   Annotation: 4th digit Qualifier: Diagnosis of  By: Lovell Sheehan MD, Balinda Quails    GERD (gastroesophageal reflux disease)    H/O hiatal hernia    "FOR YEARS" " NO PROBLEM"   Hyperlipidemia    Hypertension    Knee pain    Neuropathy    PERIPHERAL    Osteoporosis    PMR (polymyalgia rheumatica) (HCC)    Polymyalgia (HCC) 5 YEARS    Past surgical history: Past Surgical History:  Procedure Laterality Date   CARPAL TUNNEL RELEASE     CATARACT EXTRACTION     right   COLONOSCOPY  2007   INGUINAL HERNIA REPAIR  10/14/2011   Procedure: HERNIA REPAIR INGUINAL ADULT;  Surgeon: Shelly Rubenstein, MD;  Location: [facility] OR;  Service: General;  Laterality: Right;  Right Inguinal Hernia Repair with Mesh   TIBIA IM NAIL INSERTION Right 05/22/2021   Procedure: INTRAMEDULLARY (IM) NAIL TIBIAL;  Surgeon: Myrene Galas, MD;  Location: [facility] OR;  Service: Orthopedics;  Laterality: Right;   TONSILLECTOMY     VARICOSE VEIN SURGERY      Social History:  reports that he has never smoked. He has never used smokeless tobacco. He reports that he does not currently use alcohol after a past usage of about 3.0 standard drinks of alcohol per week. He reports that he does not use drugs.  Allergies:  Allergies  Allergen Reactions   Statins Other (See Comments)    Muscle pain, upset stomach, and leg pain   Statins   Family history:  Family History  Problem Relation Age of Onset   Heart disease  Father    Coronary artery disease Other      Physical Exam: Vitals:   02/04/23 1102 02/04/23 1115 02/04/23 1130 02/04/23 1139  BP:  124/73 137/80   Pulse: (!) 125 (!) 113 (!) 123   Resp: (!) 25  (!) 25   Temp:    97.6 F (36.4 C)  TempSrc:      SpO2:  100% 100%   Weight:      Height:       Wt Readings from Last 3 Encounters:  02/04/23 97.5 kg  01/25/23 101.4 kg  12/06/22 89.6 kg   Body mass index is 28.37 kg/m.  General exam: Pleasant elderly Caucasian male.  On BiPAP Skin: No rashes, lesions or  ulcers. HEENT: Atraumatic, normocephalic, no obvious bleeding Lungs: Diminished air entry both bases CVS: Tachycardic, no murmur GI/Abd soft, nontender, nondistended, bowel sound present CNS: Alert, awake, able to answer orientation questions Psychiatry: Mood appropriate Extremities: Pedal edema 1+ bilaterally.  Chronic fungal infection both feet   ------------------------------------------------------------------------------------------------------ Assessment/Plan: Principal Problem:   Acute CHF (congestive heart failure) (HCC)  Acute exacerbation of CHF Recent hospitalization for cardiogenic shock Moderate MR Moderate to severe TR Pulm hypertension due to left heart failure Presented with progressive shortness of breath, bilateral pedal edema Most recent echo from 12/04/2022 with EF 20 to 25%, moderate MR, moderate to severe TR and moderately elevated pulm artery systolic pressure BNP elevated to 700, creatinine mildly elevated.  Lactic acid elevated to 4.7. Lasix IV 60 mg 1 dose was given earlier in the ED I called cardiology consultation. Order for progressive bed for now.  If cardiology plans to start milrinone drip, may need ICU level. PTA meds-Toprol, midodrine, torsemide. Resume Toprol with IV Lasix 40 mg twice daily Net IO Since Admission: No IO data has been entered for this period [02/04/23 1253] Continue to monitor for daily intake output, weight, blood pressure, BNP, renal function and electrolytes. Recent Labs  Lab 02/04/23 0802  BNP 715.8*  BUN 30*  CREATININE 1.36*  NA 137  K 4.6   A-fib with RVR  Has history of persistent A-fib with slow ventricular response.   Tachycardia in the ED today while having CHF flareup  EKG at A-fib with RVR at the rate of 116 bpm Resume Toprol Does not seem to be on long-term anticoagulation.  CAD/HLD Recently had NSTEMI in September 2024.  Patient did not want to pursue LHC. PTA meds-Toprol, Plavix, Zetia  CKD  3b Creatinine trend as below.  Had AKI last hospitalization after which creatinine improved but plateaued at CKD 3B level.  Currently creatinine is stable. Continue to monitor Recent Labs    12/03/22 0956 12/04/22 0331 12/05/22 0309 12/06/22 0340 01/21/23 1704 01/22/23 0249 01/22/23 1456 01/23/23 0311 01/25/23 0450 02/04/23 0802  BUN 20 18 23 23  77* 75* 67* 59* 27* 30*  CREATININE 0.93 0.92 0.96 0.97 2.28* 2.17* 2.10* 2.07* 1.36* 1.36*   Hypothyroidism Not sure if patient is compliant to Synthroid at home as TSH seems to be rising up Currently on Synthroid 25 mcg daily. Recent Labs    12/04/22 0331 01/21/23 1704 01/25/23 0450 02/04/23 0802  TSH 4.762* 51.031* 38.951* 46.641*   Neuropathy Continue Neurontin  polymyalgia rheumatica  Does not seem to be on chronic steroids.  BPH Finasteride  Mobility: Limited ambulation.  Uses walker at baseline  Goals of care   Code Status: Limited: Do not attempt resuscitation (DNR) -DNR-LIMITED -Do Not Intubate/DNI     DVT prophylaxis:  heparin  injection 5,000 Units Start: 02/04/23 1400   Antimicrobials: None Fluid: None Consultants: Cardiology Family Communication: None at bedside.  Dispo: The patient is from: Home              Anticipated d/c is to: Home versus SNF, depending on clinical course  Diet: Diet Order             Diet 2 gram sodium Room service appropriate? Yes; Fluid consistency: Thin  Diet effective now                    ------------------------------------------------------------------------------------- Severity of Illness: The appropriate patient status for this patient is INPATIENT. Inpatient status is judged to be reasonable and necessary in order to provide the required intensity of service to ensure the patient's safety. The patient's presenting symptoms, physical exam findings, and initial radiographic and laboratory data in the context of their chronic comorbidities is felt to place them  at high risk for further clinical deterioration. Furthermore, it is not anticipated that the patient will be medically stable for discharge from the hospital within 2 midnights of admission.   * I certify that at the point of admission it is my clinical judgment that the patient will require inpatient hospital care spanning beyond 2 midnights from the point of admission due to high intensity of service, high risk for further deterioration and high frequency of surveillance required.* -------------------------------------------------------------------------------------  Home Meds: Prior to Admission medications   Medication Sig Start Date End Date Taking? Authorizing Provider  clopidogrel (PLAVIX) 75 MG tablet Take 1 tablet (75 mg total) by mouth daily. 12/06/22   Almon Hercules, MD  ezetimibe (ZETIA) 10 MG tablet Take 1 tablet (10 mg total) by mouth daily. 12/07/22   Almon Hercules, MD  famotidine (PEPCID) 20 MG tablet TAKE 1 TABLET AT BEDTIME 11/11/22   Burchette, Elberta Fortis, MD  finasteride (PROSCAR) 5 MG tablet TAKE ONE TABLET BY MOUTH ONCE DAILY 01/15/23   Burchette, Elberta Fortis, MD  gabapentin (NEURONTIN) 100 MG capsule Take 2 capsules in AM and 2 in PM 01/17/23   Burchette, Elberta Fortis, MD  levothyroxine (SYNTHROID) 25 MCG tablet Take 1 tablet (25 mcg total) by mouth daily before breakfast. 01/28/23   Burchette, Elberta Fortis, MD  metoprolol succinate (TOPROL-XL) 25 MG 24 hr tablet Take 1 tablet (25 mg total) by mouth daily. 12/07/22   Almon Hercules, MD  metoprolol succinate (TOPROL-XL) 50 MG 24 hr tablet Take 50 mg by mouth daily. 12/16/22   [provider]  midodrine (PROAMATINE) 5 MG tablet Take 1 tablet (5 mg total) by mouth 2 (two) times daily with a meal. 01/28/23   Burchette, Elberta Fortis, MD  polyethylene glycol (MIRALAX / GLYCOLAX) packet Take 17 g by mouth daily as needed for moderate constipation. Patient taking differently: Take 17 g by mouth 2 (two) times daily. 03/16/17   Glade Lloyd, MD   torsemide (DEMADEX) 20 MG tablet Take 1 tablet (20 mg total) by mouth daily. 12/06/22 03/06/23  Almon Hercules, MD    Labs on Admission:   CBC: Recent Labs  Lab 02/04/23 0802  WBC 11.2*  NEUTROABS 9.9*  HGB 12.7*  HCT 41.3  MCV 99.0  PLT 295    Basic Metabolic Panel: Recent Labs  Lab 02/04/23 0802  NA 137  K 4.6  CL 102  CO2 22  GLUCOSE 193*  BUN 30*  CREATININE 1.36*  CALCIUM 9.4    Liver Function Tests: No results for input(s): "  AST", "ALT", "ALKPHOS", "BILITOT", "PROT", "ALBUMIN" in the last 168 hours. No results for input(s): "LIPASE", "AMYLASE" in the last 168 hours. No results for input(s): "AMMONIA" in the last 168 hours.  Cardiac Enzymes: No results for input(s): "CKTOTAL", "CKMB", "CKMBINDEX", "TROPONINI" in the last 168 hours.  BNP (last 3 results) Recent Labs    12/03/22 0931 01/21/23 1704 02/04/23 0802  BNP 281.1* 604.5* 715.8*    ProBNP (last 3 results) No results for input(s): "PROBNP" in the last 8760 hours.  CBG: No results for input(s): "GLUCAP" in the last 168 hours.  Lipase  No results found for: "LIPASE"   Urinalysis    Component Value Date/Time   COLORURINE YELLOW 01/22/2023 0930   APPEARANCEUR HAZY (A) 01/22/2023 0930   LABSPEC 1.012 01/22/2023 0930   PHURINE 6.0 01/22/2023 0930   GLUCOSEU NEGATIVE 01/22/2023 0930   HGBUR NEGATIVE 01/22/2023 0930   HGBUR negative 11/30/2009 1133   BILIRUBINUR NEGATIVE 01/22/2023 0930   KETONESUR NEGATIVE 01/22/2023 0930   PROTEINUR NEGATIVE 01/22/2023 0930   UROBILINOGEN 0.2 03/29/2012 1458   NITRITE POSITIVE (A) 01/22/2023 0930   LEUKOCYTESUR LARGE (A) 01/22/2023 0930     Drugs of Abuse     Component Value Date/Time   LABOPIA POSITIVE (A) 03/29/2012 1458   COCAINSCRNUR NONE DETECTED 03/29/2012 1458   LABBENZ NONE DETECTED 03/29/2012 1458   AMPHETMU NONE DETECTED 03/29/2012 1458   THCU NONE DETECTED 03/29/2012 1458   LABBARB NONE DETECTED 03/29/2012 1458      Radiological  Exams on Admission: DG Chest 2 View  Result Date: 02/04/2023 CLINICAL DATA:  Shortness of breath. EXAM: CHEST - 2 VIEW COMPARISON:  01/21/2023. FINDINGS: There is diffuse pulmonary vascular congestion with bilateral hilar and bibasilar predominance. There is additional left retrocardiac airspace opacity obscuring the left hemidiaphragm, descending thoracic aorta and blunting the left lateral costophrenic angle suggesting combination of left lower lobe atelectasis and/or consolidation with pleural effusion. There are also patchy areas of scarring/atelectasis at the right lung base. Bilateral lung fields are otherwise clear. There is also small right pleural effusion. Stable cardio-mediastinal silhouette. No acute osseous abnormalities. The soft tissues are within normal limits. IMPRESSION: *Findings favor congestive heart failure/pulmonary edema characterized by diffuse pulmonary vascular congestion and bilateral pleural effusions. Electronically Signed   By: Jules Schick M.D.   On: 02/04/2023 09:13     Signed, Lorin Glass, MD [facility] 02/04/2023

## 2023-02-04 NOTE — ED Notes (Addendum)
ED TO INPATIENT HANDOFF REPORT  ED Nurse Name and Phone #: Juliette Alcide [phone]  S Name/Age/Gender Darin [name] 89 y.o. male Room/Bed: 045C/045C  Code Status   Code Status: Limited: Do not attempt resuscitation (DNR) -DNR-LIMITED -Do Not Intubate/DNI   Home/SNF/Other Home Patient oriented to: self, place and situation Is this baseline? Yes    Triage Complete: Triage complete  Chief Complaint Acute CHF (congestive heart failure) (HCC) [I50.9]  Triage Note Pt BIB EMS from home with c/o [facility] since last night. Has some pitting edema in BLE per ems  RR 33  125mg  solumedrol Duoneb x2   Allergies Allergies  Allergen Reactions   Statins Other (See Comments)    Muscle pain, upset stomach, and leg pain    Level of Care/Admitting Diagnosis ED Disposition     ED Disposition  Admit   Condition  --   Comment  Hospital Area: [facility] [[id number]]  Level of Care: Progressive [102]  Admit to Progressive based on following criteria: CARDIOVASCULAR & THORACIC of moderate stability with acute coronary syndrome symptoms/low risk myocardial infarction/hypertensive urgency/arrhythmias/heart failure potentially compromising stability and stable post cardiovascular intervention patients.  May admit patient to [facility] or [facility] if equivalent level of care is available:: No  Covid Evaluation: Asymptomatic - no recent exposure (last 10 days) testing not required  Diagnosis: Acute CHF (congestive heart failure) [facility]) [[id number]]  Admitting Physician: Lorin Glass [[id number]]  Attending Physician: Lorin Glass [[id number]]  Certification:: I certify this patient will need inpatient services for at least 2 midnights  Expected Medical Readiness: 02/07/2023          B Medical/Surgery History Past Medical History:  Diagnosis Date   Arthritis    Chronic HFrEF (heart failure with reduced ejection fraction) (HCC)    DUPUYTREN'S CONTRACTURE, RIGHT 07/04/2009    Annotation: 4th digit Qualifier: Diagnosis of  By: Lovell Sheehan MD, John E    GERD (gastroesophageal reflux disease)    H/O hiatal hernia    "FOR YEARS" " NO PROBLEM"   Hyperlipidemia    Hypertension    Knee pain    Neuropathy    PERIPHERAL    Osteoporosis    PMR (polymyalgia rheumatica) (HCC)    Polymyalgia (HCC) 5 YEARS   Past Surgical History:  Procedure Laterality Date   CARPAL TUNNEL RELEASE     CATARACT EXTRACTION     right   COLONOSCOPY  2007   INGUINAL HERNIA REPAIR  10/14/2011   Procedure: HERNIA REPAIR INGUINAL ADULT;  Surgeon: Shelly Rubenstein, MD;  Location: [facility] OR;  Service: General;  Laterality: Right;  Right Inguinal Hernia Repair with Mesh   TIBIA IM NAIL INSERTION Right 05/22/2021   Procedure: INTRAMEDULLARY (IM) NAIL TIBIAL;  Surgeon: Myrene Galas, MD;  Location: [facility] OR;  Service: Orthopedics;  Laterality: Right;   TONSILLECTOMY     VARICOSE VEIN SURGERY       A IV Location/Drains/Wounds Patient Lines/Drains/Airways Status     Active Line/Drains/Airways     Name Placement date Placement time Site Days   Peripheral IV 02/04/23 20 G Left Antecubital 02/04/23  1026  Antecubital  less than 1   Peripheral IV 02/04/23 18 G Anterior;Right Hand 02/04/23  1450  Hand  less than 1            Intake/Output Last 24 hours No intake or output data in the 24 hours ending 02/04/23 2227  Labs/Imaging Results for orders placed or performed during the hospital encounter of 02/04/23 (from the  past 48 hour(s))  CBC with Differential     Status: Abnormal   Collection Time: 02/04/23  8:02 AM  Result Value Ref Range   WBC 11.2 (H) 4.0 - 10.5 K/uL   RBC 4.17 (L) 4.22 - 5.81 MIL/uL   Hemoglobin 12.7 (L) 13.0 - 17.0 g/dL   HCT 40.9 81.1 - 91.4 %   MCV 99.0 80.0 - 100.0 fL   MCH 30.5 26.0 - 34.0 pg   MCHC 30.8 30.0 - 36.0 g/dL   RDW 78.2 95.6 - 21.3 %   Platelets 295 150 - 400 K/uL   nRBC 0.0 0.0 - 0.2 %   Neutrophils Relative % 90 %   Neutro Abs 9.9 (H) 1.7 - 7.7 K/uL    Lymphocytes Relative 5 %   Lymphs Abs 0.6 (L) 0.7 - 4.0 K/uL   Monocytes Relative 5 %   Monocytes Absolute 0.6 0.1 - 1.0 K/uL   Eosinophils Relative 0 %   Eosinophils Absolute 0.0 0.0 - 0.5 K/uL   Basophils Relative 0 %   Basophils Absolute 0.1 0.0 - 0.1 K/uL   Immature Granulocytes 0 %   Abs Immature Granulocytes 0.05 0.00 - 0.07 K/uL    Comment: Performed at [facility], [address], [place], [place] [address]  Basic metabolic panel     Status: Abnormal   Collection Time: 02/04/23  8:02 AM  Result Value Ref Range   Sodium 137 135 - 145 mmol/L   Potassium 4.6 3.5 - 5.1 mmol/L   Chloride 102 98 - 111 mmol/L   CO2 22 22 - 32 mmol/L   Glucose, Bld 193 (H) 70 - 99 mg/dL    Comment: Glucose reference range applies only to samples taken after fasting for at least 8 hours.   BUN 30 (H) 8 - 23 mg/dL   Creatinine, Ser 8.46 (H) 0.61 - 1.24 mg/dL   Calcium 9.4 8.9 - 96.2 mg/dL   GFR, Estimated 50 (L) >60 mL/min    Comment: (NOTE) Calculated using the CKD-EPI Creatinine Equation (2021)    Anion gap 13 5 - 15    Comment: Performed at [facility], [address], [place], [place] [address]  Troponin I (High Sensitivity)     Status: None   Collection Time: 02/04/23  8:02 AM  Result Value Ref Range   Troponin I (High Sensitivity) 16 <18 ng/L    Comment: (NOTE) Elevated high sensitivity troponin I (hsTnI) values and significant  changes across serial measurements may suggest ACS but many other  chronic and acute conditions are known to elevate hsTnI results.  Refer to the "Links" section for chest pain algorithms and additional  guidance. Performed at [facility], [address], [place], [place] [address]   Brain natriuretic peptide     Status: Abnormal   Collection Time: 02/04/23  8:02 AM  Result Value Ref Range   B Natriuretic Peptide 715.8 (H) 0.0 - 100.0 pg/mL    Comment: Performed at [facility], [address], [place], [place] [address]  TSH      Status: Abnormal   Collection Time: 02/04/23  8:02 AM  Result Value Ref Range   TSH 46.641 (H) 0.350 - 4.500 uIU/mL    Comment: Performed by a 3rd Generation assay with a functional sensitivity of <=0.01 uIU/mL. Performed at [facility], [address], [place], [place] [address]   T4, free     Status: None   Collection  Time: 02/04/23  8:02 AM  Result Value Ref Range   Free T4 0.78 0.61 - 1.12 ng/dL    Comment: (NOTE) Biotin ingestion may interfere with free T4 tests. If the results are inconsistent with the TSH level, previous test results, or the clinical presentation, then consider biotin interference. If needed, order repeat testing after stopping biotin. Performed at [facility], [address] [place], [place] [address]   Troponin I (High Sensitivity)     Status: Abnormal   Collection Time: 02/04/23 10:24 AM  Result Value Ref Range   Troponin I (High Sensitivity) 21 (H) <18 ng/L    Comment: (NOTE) Elevated high sensitivity troponin I (hsTnI) values and significant  changes across serial measurements may suggest ACS but many other  chronic and acute conditions are known to elevate hsTnI results.  Refer to the "Links" section for chest pain algorithms and additional  guidance. Performed at [facility], [address], [place], [place] [address]   Hepatic function panel     Status: Abnormal   Collection Time: 02/04/23 10:24 AM  Result Value Ref Range   Total Protein 7.2 6.5 - 8.1 g/dL   Albumin 3.2 (L) 3.5 - 5.0 g/dL   AST 22 15 - 41 U/L   ALT 21 0 - 44 U/L   Alkaline Phosphatase 103 38 - 126 U/L   Total Bilirubin 0.4 <1.2 mg/dL   Bilirubin, Direct <5.6 0.0 - 0.2 mg/dL   Indirect Bilirubin NOT CALCULATED 0.3 - 0.9 mg/dL    Comment: Performed at [facility], [address] [place], [place] [address]  SARS Coronavirus 2 by RT PCR (hospital order, performed in [facility] hospital lab) *cepheid single result test* Anterior Nasal Swab      Status: None   Collection Time: 02/04/23 11:03 AM   Specimen: Anterior Nasal Swab  Result Value Ref Range   SARS Coronavirus 2 by RT PCR NEGATIVE NEGATIVE    Comment: Performed at [facility], [address] [place], [place] [address]  I-Stat CG4 Lactic Acid     Status: Abnormal   Collection Time: 02/04/23 11:45 AM  Result Value Ref Range   Lactic Acid, Venous 4.7 (HH) 0.5 - 1.9 mmol/L   Comment NOTIFIED PHYSICIAN   I-Stat CG4 Lactic Acid     Status: Abnormal   Collection Time: 02/04/23  3:06 PM  Result Value Ref Range   Lactic Acid, Venous 4.8 (HH) 0.5 - 1.9 mmol/L   Comment NOTIFIED PHYSICIAN    DG Chest 2 View  Result Date: 02/04/2023 CLINICAL DATA:  Shortness of breath. EXAM: CHEST - 2 VIEW COMPARISON:  01/21/2023. FINDINGS: There is diffuse pulmonary vascular congestion with bilateral hilar and bibasilar predominance. There is additional left retrocardiac airspace opacity obscuring the left hemidiaphragm, descending thoracic aorta and blunting the left lateral costophrenic angle suggesting combination of left lower lobe atelectasis and/or consolidation with pleural effusion. There are also patchy areas of scarring/atelectasis at the right lung base. Bilateral lung fields are otherwise clear. There is also small right pleural effusion. Stable cardio-mediastinal silhouette. No acute osseous abnormalities. The soft tissues are within normal limits. IMPRESSION: *Findings favor congestive heart failure/pulmonary edema characterized by diffuse pulmonary vascular congestion and bilateral pleural effusions. Electronically Signed   By: Jules Schick M.D.   On: 02/04/2023 09:13    Pending Labs Unresulted Labs (From admission, onward)     Start     Ordered   02/07/23 0500  Digoxin  level  Once,   R        02/04/23 1714   02/05/23 0500  Basic metabolic panel  Tomorrow morning,   R        02/04/23 1124   02/05/23 0500  CBC  Tomorrow morning,   R        02/04/23 1124             Vitals/Pain Today's Vitals   02/04/23 1900 02/04/23 2016 02/04/23 2016 02/04/23 2048  BP: 129/74   120/67  Pulse: (!) 103 96  64  Resp: (!) 21   16  Temp:    98.1 F (36.7 C)  TempSrc:    Axillary  SpO2: 97%   94%  Weight:      Height:      PainSc:   0-No pain     Isolation Precautions No active isolations  Medications Medications  acetaminophen (TYLENOL) tablet 650 mg (has no administration in time range)    Or  acetaminophen (TYLENOL) suppository 650 mg (has no administration in time range)  albuterol (PROVENTIL) (2.5 MG/3ML) 0.083% nebulizer solution 2.5 mg (has no administration in time range)  hydrALAZINE (APRESOLINE) injection 10 mg (has no administration in time range)  heparin injection 5,000 Units (5,000 Units Subcutaneous Given 02/04/23 2139)  clopidogrel (PLAVIX) tablet 75 mg (75 mg Oral Given 02/04/23 1412)  ezetimibe (ZETIA) tablet 10 mg (10 mg Oral Given 02/04/23 1411)  finasteride (PROSCAR) tablet 5 mg (5 mg Oral Given 02/04/23 1411)  gabapentin (NEURONTIN) capsule 200 mg (200 mg Oral Given 02/04/23 2139)  levothyroxine (SYNTHROID) tablet 25 mcg (has no administration in time range)  metoprolol succinate (TOPROL-XL) 24 hr tablet 25 mg (25 mg Oral Given 02/04/23 1411)  furosemide (LASIX) injection 40 mg (40 mg Intravenous Given 02/04/23 1505)  digoxin (LANOXIN) tablet 0.125 mg (has no administration in time range)  albuterol (PROVENTIL) (2.5 MG/3ML) 0.083% nebulizer solution (10 mg/hr Nebulization Given 02/04/23 0825)  furosemide (LASIX) injection 60 mg (60 mg Intravenous Given 02/04/23 1026)  digoxin (LANOXIN) tablet 0.25 mg (0.25 mg Oral Given 02/04/23 2016)    Mobility walks with device, mostly wheel chair     Focused Assessments Pulmonary Assessment Handoff:  Lung sounds: Bilateral Breath Sounds: Diminished O2 Device: Nasal Cannula O2 Flow Rate (L/min): 2 L/min    R Recommendations: See Admitting Provider Note  Report given to:    Additional Notes: .

## 2023-02-04 NOTE — ED Notes (Signed)
To x-ray

## 2023-02-04 NOTE — ED Provider Notes (Signed)
[place] EMERGENCY DEPARTMENT AT [facility] Provider Note   CSN: [id number] Arrival date & time: 02/04/23  3557     History  Chief Complaint  Patient presents with   Shortness of Breath    Darin [name] is a 89 y.o. male.  HPI Patient presents 1 week after discharge, now with shortness of breath.  He notes that he was transiently better at home, but over the past 24 hours has developed new dyspnea, chest tightness. No fever, nausea, vomiting, other concerns. EMS reports tachycardia in transport and the patient received steroids, DuoNeb x 2.    Home Medications Prior to Admission medications   Medication Sig Start Date End Date Taking? Authorizing Provider  clopidogrel (PLAVIX) 75 MG tablet Take 1 tablet (75 mg total) by mouth daily. 12/06/22   Almon Hercules, MD  ezetimibe (ZETIA) 10 MG tablet Take 1 tablet (10 mg total) by mouth daily. 12/07/22   Almon Hercules, MD  famotidine (PEPCID) 20 MG tablet TAKE 1 TABLET AT BEDTIME 11/11/22   Burchette, Elberta Fortis, MD  finasteride (PROSCAR) 5 MG tablet TAKE ONE TABLET BY MOUTH ONCE DAILY 01/15/23   Burchette, Elberta Fortis, MD  gabapentin (NEURONTIN) 100 MG capsule Take 2 capsules in AM and 2 in PM 01/17/23   Burchette, Elberta Fortis, MD  levothyroxine (SYNTHROID) 25 MCG tablet Take 1 tablet (25 mcg total) by mouth daily before breakfast. 01/28/23   Burchette, Elberta Fortis, MD  metoprolol succinate (TOPROL-XL) 25 MG 24 hr tablet Take 1 tablet (25 mg total) by mouth daily. 12/07/22   Almon Hercules, MD  metoprolol succinate (TOPROL-XL) 50 MG 24 hr tablet Take 50 mg by mouth daily. 12/16/22   [provider]  midodrine (PROAMATINE) 5 MG tablet Take 1 tablet (5 mg total) by mouth 2 (two) times daily with a meal. 01/28/23   Burchette, Elberta Fortis, MD  polyethylene glycol (MIRALAX / GLYCOLAX) packet Take 17 g by mouth daily as needed for moderate constipation. Patient taking differently: Take 17 g by mouth 2 (two) times daily. 03/16/17   Glade Lloyd,  MD  torsemide (DEMADEX) 20 MG tablet Take 1 tablet (20 mg total) by mouth daily. 12/06/22 03/06/23  Almon Hercules, MD      Allergies    Statins    Review of Systems   Review of Systems  Physical Exam Updated Vital Signs BP 137/80   Pulse (!) 123   Temp 97.6 F (36.4 C)   Resp (!) 25   Ht 6\' 1"  (1.854 m)   Wt 97.5 kg   SpO2 100%   BMI 28.37 kg/m  Physical Exam Vitals and nursing note reviewed.  Constitutional:      Appearance: He is well-developed. He is ill-appearing and diaphoretic.  HENT:     Head: Normocephalic and atraumatic.  Eyes:     Conjunctiva/sclera: Conjunctivae normal.  Cardiovascular:     Rate and Rhythm: Tachycardia present. Rhythm irregular.  Pulmonary:     Effort: Tachypnea and respiratory distress present.     Breath sounds: Decreased breath sounds and wheezing present.  Abdominal:     General: There is no distension.  Musculoskeletal:     Right lower leg: Edema present.     Left lower leg: Edema present.  Skin:    General: Skin is warm.  Neurological:     Mental Status: He is alert and oriented to person, place, and time.     ED Results / Procedures / Treatments  Labs (all labs ordered are listed, but only abnormal results are displayed) Labs Reviewed  CBC WITH DIFFERENTIAL/PLATELET - Abnormal; Notable for the following components:      Result Value   WBC 11.2 (*)    RBC 4.17 (*)    Hemoglobin 12.7 (*)    Neutro Abs 9.9 (*)    Lymphs Abs 0.6 (*)    All other components within normal limits  BASIC METABOLIC PANEL - Abnormal; Notable for the following components:   Glucose, Bld 193 (*)    BUN 30 (*)    Creatinine, Ser 1.36 (*)    GFR, Estimated 50 (*)    All other components within normal limits  BRAIN NATRIURETIC PEPTIDE - Abnormal; Notable for the following components:   B Natriuretic Peptide 715.8 (*)    All other components within normal limits  TSH - Abnormal; Notable for the following components:   TSH 46.641 (*)    All other  components within normal limits  TROPONIN I (HIGH SENSITIVITY) - Abnormal; Notable for the following components:   Troponin I (High Sensitivity) 21 (*)    All other components within normal limits  SARS CORONAVIRUS 2 BY RT PCR  T4, FREE  I-STAT CG4 LACTIC ACID, ED  TROPONIN I (HIGH SENSITIVITY)    EKG EKG Interpretation Date/Time:  Tuesday February 04 2023 06:28:55 EST Ventricular Rate:  116 PR Interval:    QRS Duration:  91 QT Interval:  352 QTC Calculation: 489 R Axis:   92  Text Interpretation: Atrial fibrillation Artifact Abnormal ECG Confirmed by Gerhard Munch [id number]) on 02/04/2023 7:33:24 AM  Radiology DG Chest 2 View  Result Date: 02/04/2023 CLINICAL DATA:  Shortness of breath. EXAM: CHEST - 2 VIEW COMPARISON:  01/21/2023. FINDINGS: There is diffuse pulmonary vascular congestion with bilateral hilar and bibasilar predominance. There is additional left retrocardiac airspace opacity obscuring the left hemidiaphragm, descending thoracic aorta and blunting the left lateral costophrenic angle suggesting combination of left lower lobe atelectasis and/or consolidation with pleural effusion. There are also patchy areas of scarring/atelectasis at the right lung base. Bilateral lung fields are otherwise clear. There is also small right pleural effusion. Stable cardio-mediastinal silhouette. No acute osseous abnormalities. The soft tissues are within normal limits. IMPRESSION: *Findings favor congestive heart failure/pulmonary edema characterized by diffuse pulmonary vascular congestion and bilateral pleural effusions. Electronically Signed   By: Jules Schick M.D.   On: 02/04/2023 09:13    Procedures Procedures    Medications Ordered in ED Medications  acetaminophen (TYLENOL) tablet 650 mg (has no administration in time range)    Or  acetaminophen (TYLENOL) suppository 650 mg (has no administration in time range)  albuterol (PROVENTIL) (2.5 MG/3ML) 0.083% nebulizer solution 2.5  mg (has no administration in time range)  hydrALAZINE (APRESOLINE) injection 10 mg (has no administration in time range)  heparin injection 5,000 Units (has no administration in time range)  albuterol (PROVENTIL) (2.5 MG/3ML) 0.083% nebulizer solution (10 mg/hr Nebulization Given 02/04/23 0825)  furosemide (LASIX) injection 60 mg (60 mg Intravenous Given 02/04/23 1026)    ED Course/ Medical Decision Making/ A&P                                 Medical Decision Making Patient with history of heart failure recent hospitalization presents with dyspnea, chest tightness, lower extremity edema.  On exam patient is awake, alert, but tachypneic, tachycardic, with bilateral wheezing.  Concern for CHF exacerbation  versus pneumonia versus mixed etiology respiratory failure.  With supplemental oxygen patient's saturation 94% borderline Cardiac 110 irregular, likely A-fib abnormal Patient continued continuous bronchodilators, labs, x-ray ordered.   Amount and/or Complexity of Data Reviewed Independent Historian: EMS    Details: EMS rhythm strip with rate 113 A-fib abnormal External Data Reviewed: notes. Labs: ordered. Decision-making details documented in ED Course. Radiology: ordered and independent interpretation performed. Decision-making details documented in ED Course. ECG/medicine tests: ordered and independent interpretation performed. Decision-making details documented in ED Course.  Risk Prescription drug management. Decision regarding hospitalization. Diagnosis or treatment significantly limited by social determinants of health.   With persistent tachypnea, after initial presentation, lack of improvement following bronchodilators patient was placed on continuous BiPAP.  1 attempt at titrating this off was unsuccessful with persistent tachypnea.  Patient's mental status from remained unremarkable throughout.  ECG without overt ischemic changes, troponin minimally abnormal.  Patient received  IV Lasix given concern for acute heart failure exacerbation, particular given the patient's history of similar.  Today's renal function appears better than his last hospitalization.  Given need for continued BiPAP, diuresis, monitoring and management discussed his case with our internal medicine colleagues and the patient will require stepdown level of care.    Final Clinical Impression(s) / ED Diagnoses Final diagnoses:  Respiratory distress   CRITICAL CARE Performed by: Gerhard Munch Total critical care time: 40 minutes Critical care time was exclusive of separately billable procedures and treating other patients. Critical care was necessary to treat or prevent imminent or life-threatening deterioration. Critical care was time spent personally by me on the following activities: development of treatment plan with patient and/or surrogate as well as nursing, discussions with consultants, evaluation of patient's response to treatment, examination of patient, obtaining history from patient or surrogate, ordering and performing treatments and interventions, ordering and review of laboratory studies, ordering and review of radiographic studies, pulse oximetry and re-evaluation of patient's condition.    Gerhard Munch, MD 02/04/23 [phone]

## 2023-02-05 ENCOUNTER — Other Ambulatory Visit (HOSPITAL_COMMUNITY): Payer: Self-pay

## 2023-02-05 DIAGNOSIS — I4811 Longstanding persistent atrial fibrillation: Secondary | ICD-10-CM | POA: Diagnosis not present

## 2023-02-05 DIAGNOSIS — I5021 Acute systolic (congestive) heart failure: Secondary | ICD-10-CM | POA: Diagnosis not present

## 2023-02-05 DIAGNOSIS — I5023 Acute on chronic systolic (congestive) heart failure: Secondary | ICD-10-CM | POA: Diagnosis not present

## 2023-02-05 LAB — CBC
HCT: 35.1 % — ABNORMAL LOW (ref 39.0–52.0)
Hemoglobin: 11.1 g/dL — ABNORMAL LOW (ref 13.0–17.0)
MCH: 30.2 pg (ref 26.0–34.0)
MCHC: 31.6 g/dL (ref 30.0–36.0)
MCV: 95.6 fL (ref 80.0–100.0)
Platelets: 246 10*3/uL (ref 150–400)
RBC: 3.67 MIL/uL — ABNORMAL LOW (ref 4.22–5.81)
RDW: 14.6 % (ref 11.5–15.5)
WBC: 6.3 10*3/uL (ref 4.0–10.5)
nRBC: 0 % (ref 0.0–0.2)

## 2023-02-05 LAB — BASIC METABOLIC PANEL
Anion gap: 9 (ref 5–15)
BUN: 29 mg/dL — ABNORMAL HIGH (ref 8–23)
CO2: 26 mmol/L (ref 22–32)
Calcium: 9.6 mg/dL (ref 8.9–10.3)
Chloride: 100 mmol/L (ref 98–111)
Creatinine, Ser: 1.28 mg/dL — ABNORMAL HIGH (ref 0.61–1.24)
GFR, Estimated: 53 mL/min — ABNORMAL LOW (ref >60)
Glucose, Bld: 141 mg/dL — ABNORMAL HIGH (ref 70–99)
Potassium: 5.5 mmol/L — ABNORMAL HIGH (ref 3.5–5.1)
Sodium: 135 mmol/L (ref 135–145)

## 2023-02-05 LAB — LACTIC ACID, PLASMA: Lactic Acid, Venous: 1.3 mmol/L (ref 0.5–1.9)

## 2023-02-05 MED ORDER — MELATONIN 5 MG PO TABS
5.0000 mg | ORAL_TABLET | Freq: Every evening | ORAL | Status: DC | PRN
  Administered 2023-02-05: 5 mg via ORAL
  Filled 2023-02-05 (×2): qty 1

## 2023-02-05 MED ORDER — LEVOTHYROXINE SODIUM 50 MCG PO TABS
50.0000 ug | ORAL_TABLET | Freq: Every day | ORAL | Status: DC
  Administered 2023-02-06 – 2023-02-07 (×2): 50 ug via ORAL
  Filled 2023-02-05 (×2): qty 1

## 2023-02-05 MED ORDER — DAPAGLIFLOZIN PROPANEDIOL 10 MG PO TABS
10.0000 mg | ORAL_TABLET | Freq: Every day | ORAL | Status: DC
  Administered 2023-02-05 – 2023-02-07 (×3): 10 mg via ORAL
  Filled 2023-02-05 (×3): qty 1

## 2023-02-05 NOTE — Progress Notes (Addendum)
   Patient Name: Darin [name] Date of Encounter: 02/05/2023 [facility] Cardiologist: Arly Salminen  Interval Summary  .    Improved.  Almost able to lie flat.  Less edema.  Denies angina. Markedly improved ventricular rate control. Potassium surprisingly 5.5 this morning, has not received potassium supplements.  Renal function improving with diuretics. Hard to fully estimate net diuresis as he was transferring from ED.  Vital Signs .    Vitals:   02/04/23 2329 02/05/23 0335 02/05/23 0814 02/05/23 0816  BP: 121/71 115/64 111/60 116/60  Pulse: 88 86 89 89  Resp: 14 16 18    Temp: 98.9 F (37.2 C) 98.5 F (36.9 C) 99.5 F (37.5 C)   TempSrc: Oral Oral Oral   SpO2: 91% 92% 94%   Weight: 97.8 kg 97.9 kg    Height: 6\' 1"  (1.854 m)       Intake/Output Summary (Last 24 hours) at 02/05/2023 0954 Last data filed at 02/05/2023 0814 Gross per 24 hour  Intake 480 ml  Output 1100 ml  Net -620 ml      02/05/2023    3:35 AM 02/04/2023   11:29 PM 02/04/2023    6:25 AM  Last 3 Weights  Weight (lbs) 215 lb 13.3 oz 215 lb 9.8 oz 215 lb  Weight (kg) 97.9 kg 97.8 kg 97.523 kg      Telemetry/ECG    Atrial fibrillation with ventricular rate 70s-90- Personally Reviewed  Physical Exam .   GEN: No acute distress.   Neck: 5 cm JVD Cardiac: Irregular , no murmurs, rubs, or gallops.  Respiratory: Clear to auscultation bilaterally. GI: Soft, nontender, non-distended  MS: 2+ pitting edema left pretibial area, 1+ pitting edema on right  Assessment & Plan .     Improved heart failure symptoms and signs.  Still has evidence of hypervolemia.  Continue intravenous diuretics at least another 24 hours. Digoxin seems to have had a major beneficial effect on his rate control, but I am concerned about the hyperkalemia.  Will monitor this on a daily basis.  Check digoxin level in a couple of mornings.  For questions or updates, please contact [facility] Please consult  [email or web address] for contact info under        Signed, Thurmon Fair, MD

## 2023-02-05 NOTE — Evaluation (Signed)
Physical Therapy Evaluation Patient Details Name: Darin [name] MRN: [id number] DOB: [birth year] Today's Date: 02/05/2023  History of Present Illness  Patient is a 89 year old male with shortness of breath, LE edema, admitted with acute CHF. History of HTN, HLD, HFrEF, MR, TR, pulmonary HTN, presumed CAD, afib/flutter, neuropathy, BPH, GERD, normocytic anemia, polymyalgia rheumatica  Clinical Impression  Patient is agreeable to PT evaluation. He reports he has already stood with assistance once today. He is hopeful to return home soon without oxygen if possible. He lives alone but has a personal care attendant. At baseline, he uses the wheelchair primarily but can ambulate around the home using rolling walker with wheelchair follow for safety.   Today, the patient has mild dyspnea with exertion. Sp02 90% on 3 L02 with standing activity. Patient was able to stand with Min A using rolling walker with standing tolerance of less than 20 seconds. Activity tolerance limited by fatigue. Recommend to continue PT to maximize independence and facilitate return to prior level of function.       If plan is discharge home, recommend the following: A little help with walking and/or transfers;A little help with bathing/dressing/bathroom;Assistance with cooking/housework;Assist for transportation   Can travel by private vehicle        Equipment Recommendations None recommended by PT  Recommendations for Other Services       Functional Status Assessment Patient has had a recent decline in their functional status and demonstrates the ability to make significant improvements in function in a reasonable and predictable amount of time.     Precautions / Restrictions Precautions Precautions: Fall Precaution Comments: monitor Sp02 Restrictions Weight Bearing Restrictions: No      Mobility  Bed Mobility Overal bed mobility: Needs Assistance Bed Mobility: Supine to Sit, Sit to Sidelying     Supine to  sit: Contact guard, HOB elevated Sit to supine: Contact guard assist, HOB elevated   General bed mobility comments: cues for technique    Transfers Overall transfer level: Needs assistance Equipment used: Rolling walker (2 wheels) Transfers: Sit to/from Stand Sit to Stand: From elevated surface, Min assist           General transfer comment: lifting assistance required for standing    Ambulation/Gait               General Gait Details: patient declined and is fatigued with minimal standing activity  Stairs            Wheelchair Mobility     Tilt Bed    Modified Rankin (Stroke Patients Only)       Balance Overall balance assessment: Needs assistance Sitting-balance support: Feet supported Sitting balance-Leahy Scale: Fair     Standing balance support: Bilateral upper extremity supported Standing balance-Leahy Scale: Poor Standing balance comment: relying heavily on rolling walker for support in standing. standing tolerance less than 20 seconds                             Pertinent Vitals/Pain Pain Assessment Pain Assessment: No/denies pain    Home Living Family/patient expects to be discharged to:: Private residence Living Arrangements: Alone Available Help at Discharge: Personal care attendant Type of Home: House Home Access: Level entry       Home Layout: One level Home Equipment: Agricultural consultant (2 wheels);Wheelchair - manual;Shower seat Additional Comments: Has personal care aide 5 days a week, 7 hours total per day but divided up between AM &  PM.    Prior Function Prior Level of Function : Needs assist             Mobility Comments: using rolling walker for transfers to and from wheelchair. patient has wheelchair follow for short distance ambulation with rolling walker around the house. does not ambulate without second person for chair follow ADLs Comments: intermittent assistance from personal care attendant but can  perform some ADLs independently     Extremity/Trunk Assessment   Upper Extremity Assessment Upper Extremity Assessment: Generalized weakness    Lower Extremity Assessment Lower Extremity Assessment: Generalized weakness    Cervical / Trunk Assessment Cervical / Trunk Assessment: Kyphotic  Communication   Communication Communication: No apparent difficulties  Cognition Arousal: Alert Behavior During Therapy: WFL for tasks assessed/performed Overall Cognitive Status: Within Functional Limits for tasks assessed                                          General Comments General comments (skin integrity, edema, etc.): mild dyspnea with exertion with Sp02 90% on 3 L02 with standing activity. cues for breathing techniques    Exercises     Assessment/Plan    PT Assessment Patient needs continued PT services  PT Problem List Decreased strength;Decreased range of motion;Decreased activity tolerance;Decreased balance;Decreased mobility;Decreased knowledge of use of DME       PT Treatment Interventions DME instruction;Gait training;Functional mobility training;Therapeutic activities;Balance training;Therapeutic exercise;Neuromuscular re-education;Wheelchair mobility training;Patient/family education    PT Goals (Current goals can be found in the Care Plan section)  Acute Rehab PT Goals Patient Stated Goal: to return home PT Goal Formulation: With patient Time For Goal Achievement: 02/12/23 Potential to Achieve Goals: Good    Frequency Min 1X/week     Co-evaluation               AM-PAC PT "6 Clicks" Mobility  Outcome Measure Help needed turning from your back to your side while in a flat bed without using bedrails?: None Help needed moving from lying on your back to sitting on the side of a flat bed without using bedrails?: A Little Help needed moving to and from a bed to a chair (including a wheelchair)?: A Little Help needed standing up from a chair  using your arms (e.g., wheelchair or bedside chair)?: A Little Help needed to walk in hospital room?: A Lot Help needed climbing 3-5 steps with a railing? : Total 6 Click Score: 16    End of Session   Activity Tolerance: Patient limited by fatigue Patient left: in bed;with call bell/phone within reach;with bed alarm set   PT Visit Diagnosis: Unsteadiness on feet (R26.81);Muscle weakness (generalized) (M62.81)    Time: 5409-8119 PT Time Calculation (min) (ACUTE ONLY): 18 min   Charges:   PT Evaluation $PT Eval Low Complexity: 1 Low   PT General Charges $$ ACUTE PT VISIT: 1 Visit         Donna Bernard, PT, MPT   Ina Homes 02/05/2023, 2:25 PM

## 2023-02-05 NOTE — Plan of Care (Signed)
  Problem: Education: Goal: Knowledge of General Education information will improve Description Including pain rating scale, medication(s)/side effects and non-pharmacologic comfort measures Outcome: Progressing   

## 2023-02-05 NOTE — Progress Notes (Signed)
PROGRESS NOTE  Darin [name]  DOB: June 15, 1931  PCP: [name], MD OZH:[id number]  DOA: 02/04/2023  LOS: 1 day  Hospital Day: 2  Brief narrative: Darin [name] is a 89 y.o. male with PMH significant for HTN, HLD, chronic systolic CHF EF 20 to 25%, persistent A-fib, CHF, neuropathy, polymyalgia rheumatica who was recently hospitalized 3 weeks ago for cardiogenic shock, medically managed, optimized and discharged home on GDMT. 11/12, patient was brought to the ED from home by EMS with complaint of shortness of breath since last night.  Also has progressively worsening bilateral lower extremity edema.  In the ED, patient was tachycardic to 110s, tachypneic to 20s requiring 8 L oxygen by nasal cannula. Patient oxygen saturation dropped down to 70s and later he was put on BiPAP. Labs with WC count 11.2, BNP 716, BUN/creatinine 30/1.36 Chest x-ray showed diffuse pulm edema, pulmonary vascular congestion, bilateral pleural effusion suggestive of CHF EKG at A-fib with RVR at the rate of 116 bpm He was given 1 dose of IV Lasix 60 mg. TRH was consulted. Lactic acid level came elevated at 4.7.  Cardiology was consulted with concern of cardiogenic shock.  Subjective: Patient was seen and examined this morning. Lying on bed.  On 3 L oxygen by nasal collar.  None at home. Noted interaction cardiology had with the patient.  Patient believes he still has 1 to 2 years before he considers hospice care. Labs from this morning showed renal function improving with diuretics.  Assessment/Plan: Principal Problem:   Acute CHF (congestive heart failure) (HCC)  Acute exacerbation of CHF Recent hospitalization for cardiogenic shock Moderate MR Moderate to severe TR Pulm hypertension due to left heart failure Presented with progressive shortness of breath, bilateral pedal edema Most recent echo from 12/04/2022 with EF 20 to 25%, moderate MR, moderate to severe TR and moderately elevated pulm  artery systolic pressure BNP elevated to 700, creatinine mildly elevated.  Lactic acid elevated to 4.7. Patient was diuresed with IV Lasix 40 mg twice daily. Cardiology consulted.  Noted a plan to continue IV diuresis for another 24 hours.  Also started on digoxin 0.125 mg daily. Continue Toprol 25 mg daily Net IO Since Admission: -320 mL [02/05/23 1425] Continue to monitor for daily intake output, weight, blood pressure, BNP, renal function and electrolytes. Recent Labs  Lab 02/04/23 0802 02/05/23 0351  BNP 715.8*  --   BUN 30* 29*  CREATININE 1.36* 1.28*  NA 137 135  K 4.6 5.5*   A-fib with RVR  Has history of persistent A-fib with slow ventricular response.   Presented with tachycardia in the setting of CHF flareup EKG at A-fib with RVR at the rate of 116 bpm Heart rate improved with metoprolol, digoxin Does not seem to be on long-term anticoagulation.  CAD/HLD Recently had NSTEMI in September 2024.  Patient did not want to pursue LHC. PTA meds-Toprol, Plavix, Zetia.  CKD 3b Creatinine trend as below.  Had AKI last hospitalization after which creatinine improved but plateaued at CKD 3B level.  Currently creatinine is stable. Continue to monitor Recent Labs    12/04/22 0331 12/05/22 0309 12/06/22 0340 01/21/23 1704 01/22/23 0249 01/22/23 1456 01/23/23 0311 01/25/23 0450 02/04/23 0802 02/05/23 0351  BUN 18 23 23  77* 75* 67* 59* 27* 30* 29*  CREATININE 0.92 0.96 0.97 2.28* 2.17* 2.10* 2.07* 1.36* 1.36* 1.28*   Hypothyroidism Not sure if patient is compliant to Synthroid at home as TSH seems to be rising up Currently on Synthroid 25  mcg daily.  I would increase it to 50 mg daily. Recent Labs    12/04/22 0331 01/21/23 1704 01/25/23 0450 02/04/23 0802  TSH 4.762* 51.031* 38.951* 46.641*   Neuropathy Continue Neurontin  polymyalgia rheumatica  Does not seem to be on chronic steroids.  BPH Finasteride  Mobility: Limited ambulation.  Uses walker at  baseline.  PT eval pending  Goals of care   Code Status: Limited: Do not attempt resuscitation (DNR) -DNR-LIMITED -Do Not Intubate/DNI     DVT prophylaxis:  heparin injection 5,000 Units Start: 02/04/23 1400   Antimicrobials: None Fluid: None Consultants: Cardiology Family Communication: None at bedside.  Status: Inpatient Level of care:  Progressive   Patient is from: Home Needs to continue in-hospital care: Ongoing IV diuresis Anticipated d/c to: Home with home with PT hopefully in 1 to 2 days    Diet:  Diet Order             Diet 2 gram sodium Room service appropriate? Yes; Fluid consistency: Thin  Diet effective now                   Scheduled Meds:  clopidogrel  75 mg Oral Daily   dapagliflozin propanediol  10 mg Oral Daily   digoxin  0.125 mg Oral Daily   ezetimibe  10 mg Oral Daily   finasteride  5 mg Oral Daily   furosemide  40 mg Intravenous BID   gabapentin  200 mg Oral BID   heparin  5,000 Units Subcutaneous Q8H   [START ON 02/06/2023] levothyroxine  50 mcg Oral QAC breakfast   metoprolol succinate  25 mg Oral Daily    PRN meds: acetaminophen **OR** acetaminophen, albuterol, hydrALAZINE, polyethylene glycol   Infusions:    Antimicrobials: Anti-infectives (From admission, onward)    None       Objective: Vitals:   02/05/23 0814 02/05/23 0816  BP: 111/60 116/60  Pulse: 89 89  Resp: 18   Temp: 99.5 F (37.5 C)   SpO2: 94%     Intake/Output Summary (Last 24 hours) at 02/05/2023 1425 Last data filed at 02/05/2023 1329 Gross per 24 hour  Intake 780 ml  Output 1100 ml  Net -320 ml   Filed Weights   02/04/23 0625 02/04/23 2329 02/05/23 0335  Weight: 97.5 kg 97.8 kg 97.9 kg   Weight change: 0.277 kg Body mass index is 28.48 kg/m.   Physical Exam:  General exam: Pleasant elderly Caucasian male.  Not in pain Skin: No rashes, lesions or ulcers. HEENT: Atraumatic, normocephalic, no obvious bleeding Lungs: Improving bilateral  air entry CVS: Tachycardic, no murmur GI/Abd soft, nontender, nondistended, bowel sound present CNS: Alert, awake, able to answer orientation questions Psychiatry: Mood appropriate Extremities: Improving pedal edema 1+ bilaterally.  Chronic fungal infection both feet  Data Review: I have personally reviewed the laboratory data and studies available.  F/u labs ordered Unresulted Labs (From admission, onward)     Start     Ordered   02/07/23 0500  Digoxin level  Once,   R        02/04/23 1714   02/06/23 0500  Basic metabolic panel  Daily,   R     Question:  Specimen collection method  Answer:  Lab=Lab collect   02/05/23 1003   02/06/23 0500  Magnesium  Tomorrow morning,   R       Question:  Specimen collection method  Answer:  Lab=Lab collect   02/05/23 1113  Total time spent in review of labs and imaging, patient evaluation, formulation of plan, documentation and communication with family: 45 minutes  Signed, Lorin Glass, MD [facility] 02/05/2023

## 2023-02-05 NOTE — TOC Initial Note (Signed)
Transition of Care [facility]) - Initial/Assessment Note    Patient Details  Name: Darin [name] MRN: [id number] Date of Birth: 1932/02/26  [facility] CM/SW Contact:    Darin Haven, RN Phone Number: 02/05/2023, 10:55 AM  Clinical Narrative:                 From home alone, will need ambulance transport at discharge.  Has PCP and insurance on file, states he is active with Darin [name] for [facility] services and outpatient palliative services with [facility]. He has an aide from 8 am to 11 or 12, and an aide from 5 to 8 pm five days a week.  He has help on the weekend also with neighbors.  He has a w/chair, and a walker.    His son  Darin [name] is his HPOA and his son and he lives in [place],  he is support system, states gets medications from [employer] at [place].   Pta w/chair bound.  Expected Discharge Plan: Home w Home Health Services Barriers to Discharge: Continued Medical Work up   Patient Goals and CMS Choice Patient states their goals for this hospitalization and ongoing recovery are:: return home   Choice offered to / list presented to : NA      Expected Discharge Plan and Services In-house Referral: NA Discharge Planning Services: CM Consult Post Acute Care Choice: Home Health, Resumption of Svcs/PTA Provider Living arrangements for the past 2 months: Single Family Home                 DME Arranged: N/A DME Agency: NA       HH Arranged: RN, PT, OT, Disease Management HH Agency: [facility] Date [facility] Agency Contacted: 02/05/23 Time HH Agency Contacted: 1053 Representative spoke with at [facility] Agency: Kandee Keen  Prior Living Arrangements/Services Living arrangements for the past 2 months: Single Family Home Lives with:: Self Patient language and need for interpreter reviewed:: Yes Do you feel safe going back to the place where you live?: Yes      Need for Family Participation in Patient Care: Yes (Comment) Care giver support system in place?:  Yes (comment) Current home services: DME (walker, w/chair) Criminal Activity/Legal Involvement Pertinent to Current Situation/Hospitalization: No - Comment as needed  Activities of Daily Living   ADL Screening (condition at time of admission) Independently performs ADLs?: Yes (appropriate for developmental age) Is the patient deaf or have difficulty hearing?: No Does the patient have difficulty seeing, even when wearing glasses/contacts?: No Does the patient have difficulty concentrating, remembering, or making decisions?: No  Permission Sought/Granted Permission sought to share information with : Case Manager Permission granted to share information with : Yes, Verbal Permission Granted     Permission granted to share info w AGENCY: HH, Palliative        Emotional Assessment Appearance:: Appears stated age Attitude/Demeanor/Rapport: Engaged Affect (typically observed): Appropriate Orientation: : Oriented to Self, Oriented to Place, Oriented to  Time, Oriented to Situation Alcohol / Substance Use: Not Applicable Psych Involvement: No (comment)  Admission diagnosis:  Respiratory distress [R06.03] Acute CHF (congestive heart failure) (HCC) [I50.9] Patient Active Problem List   Diagnosis Date Noted   Acute CHF (congestive heart failure) (HCC) 02/04/2023   Acute renal failure (HCC) 01/22/2023   Hyperkalemia 01/22/2023   Hypotension 01/22/2023   Acute exacerbation of CHF (congestive heart failure) (HCC) 01/21/2023   Acute respiratory failure with hypoxia (HCC) 12/03/2022   CHF exacerbation (HCC) 12/03/2022  Normocytic anemia 12/03/2022   Elevated troponin/likely NSTEMI 12/03/2022   Trigger finger, right middle finger 07/10/2022   Chronic heel pain, right 11/28/2021   Tibia/fibula fracture, right, closed, initial encounter 05/20/2021   Callus 04/27/2021   Arthritis of right ankle 10/31/2020   Trigger finger, left ring finger 09/15/2020   Greater trochanteric bursitis of left  hip 02/12/2019   Strain of left piriformis muscle 10/05/2018   Pain due to onychomycosis of toenails of both feet 09/23/2018   Chronic left SI joint pain 04/27/2018   Right wrist pain 02/05/2018   Arthritis of right knee 05/20/2017   Leg edema, right 02/20/2015   BPH (benign prostatic hyperplasia) 12/09/2014   Chronic radicular low back pain 08/06/2012   Syncope 03/29/2012   Hyponatremia 03/29/2012   Localized osteoarthrosis, lower leg 06/20/2010   VARICOSE VEINS LOWER EXTREMITIES W/INFLAMMATION 02/12/2010   SEC LOCALIZED OSTEOARTHROSIS INVOLVING HAND 10/16/2009   DEGENERATIVE DISC DISEASE, LUMBOSACRAL SPINE W/RADICULOPATHY 06/19/2009   CARCINOMA, BASAL CELL 02/07/2009   CATARACT ASSOCIATED WITH OTHER SYNDROMES 08/01/2008   DRY SKIN 08/01/2008   CERUMEN IMPACTION, BILATERAL 03/01/2008   CONSTIPATION, DRUG INDUCED 12/28/2007   POLYNEUROPATHY OTHER DISEASES CLASSIFIED ELSW 09/24/2007   Hyperlipidemia 09/22/2006   Essential hypertension 09/22/2006   GERD (gastroesophageal reflux disease) 09/22/2006   Osteoarthritis 09/22/2006   PCP:  [name], MD Pharmacy:   [facility] [place], [place] - [address] [place] [address] Phone: [phone] Fax: [phone]  [facility] - [place], [place] - [address] [facility] AT Portal to Registered [facility] [address] [place] [address] Phone: [phone] Fax: [phone]  [facility] [address] [place] [address] Phone: [phone] Fax: [phone]     Social Determinants of Health (SDOH) Social History: SDOH Screenings   Food Insecurity: No Food Insecurity (02/04/2023)  Housing: Low Risk  (02/04/2023)  Transportation Needs: No Transportation Needs (02/04/2023)  Utilities: Not At Risk (02/04/2023)  Alcohol Screen: Low Risk  (08/30/2022)  Depression (PHQ2-9): Low Risk  (08/30/2022)   Financial Resource Strain: Low Risk  (08/30/2022)  Physical Activity: Sufficiently Active (08/30/2022)  Social Connections: Moderately Integrated (08/30/2022)  Stress: No Stress Concern Present (08/30/2022)  Tobacco Use: Low Risk  (02/04/2023)   SDOH Interventions:     Readmission Risk Interventions     No data to display

## 2023-02-05 NOTE — Consult Note (Signed)
[facility] Liaison Consult Note    02/05/2023  Bhavesh Church 1932-02-08 [id number]  Insurance: Medicare ACO REACH   Primary Care Provider: [name], MD, with [place] at [facility], this provider is listed for the transition of care follow up appointments  and [facility] calls   [facility] Liaison met patient at bedside at [facility].    The patient was screened for 30 day readmission hospitalization with noted rising high medium risk score for unplanned readmission risk 3 hospital admissions in 6 months.  The patient was assessed for potential Community Care Coordination service needs for post hospital transition for care coordination. Review of patient's electronic medical record reveals patient has ben recently outreached by the [facility] RN noted.  Patient has a private pay aide, Cordelia Pen,  at home and he has had active with outreach.  Patient states he didn't know who the palliative care was with.   Plan: [facility] Liaison will continue to follow progress and disposition to asess for post hospital community care coordination needs.  Referral request for community care coordination: Anticipate post hospital follow up with Commuity TOC team.  Patient states no new needs at this time.   VBCI Community Care, Population Health does not replace or interfere with any arrangements made by the Inpatient Transition of Care team.   For questions contact:   Charlesetta Shanks, RN, BSN, CCM [place]  [facility], [facility] Liaison Direct Dial: [phone] or secure chat Email: [email or web address]

## 2023-02-05 NOTE — Progress Notes (Signed)
SATURATION QUALIFICATIONS: (This note is used to comply with regulatory documentation for home oxygen)  Patient Saturations on Room Air at Rest = 89%  Patient Saturations on Room Air while Ambulating = 86%  Patient Saturations on 3 Liters of oxygen while Ambulating = 92%  Please briefly explain why patient needs home oxygen:   Patient only agreeable to stand at bedside, rather then ambulate. RA baseline prior to admission. While sitting EOB on room air, desat to 88%. Further desat to 86% when standing at bedside on room air.

## 2023-02-05 NOTE — Progress Notes (Signed)
Heart Failure Stewardship Pharmacist Progress Note   PCP: [name], MD PCP-Cardiologist: None    HPI:  89 yo M with PMH of HTN, HLD, HFrEF, MR, TR, pulmonary HTN, presumed CAD, afib/flutter, carotid artery disease, neuropathy, BPH, GERD, normocytic anemia, and polymyalgia rheumatica.   Admitted 11/2022 with new HFrEF, EF 20-25%, RV mildly reduced. Seen by Dr. Algie Coffer and declined cardiac cath. He was discharged on torsemide, metoprolol XL, and benazepril.  Readmitted 12/2022 with nausea and diaphoresis, found to have cardiogenic shock, AKI, and hyperkalemia. Required ICU stay with pressors. [facility] was consulted. He was diuresed and discharged on limited GDMT due to hypotension requiring midodrine.   He was seen by outpatient palliative and elected to remain DNR and continue medication management. Seen by PCP on 11/5 and reported stable weights and reported compliance with fluid restriction.  Presented to the ED on 11/12 with shortness of breath, orthopnea, and LE edema. Hypoxic to 70-80s on arrival despite O2 and was transitioned to BiPAP. Found to be in afib RVR. Lactic acid 4.7 and BNP 715. CXR with pulmonary edema. Lactic acid has improved to 1.3 on 11/13.   Reports he is feeling somewhat better. Still on 3L O2 (not on any oxygen at home). Weighs himself daily but after he is dressed and has breakfast. States his dry weight is around 215 lbs. Has difficulty reading his scale and lives alone. Has an aid come to his house. Reports urinary incontinence when in the hospital but denies it being an issue at home. Is able to make it to the bathroom in time when he has to urinate. Uses a walker. [employer] delivers medications to his home.   Current HF Medications: Diuretic: furosemide 40 mg IV BID Beta Blocker: metoprolol XL 25 mg daily Other: digoxin 0.125 mg daily  Prior to admission HF Medications: Diuretic: torsemide 20 mg daily Beta blocker: metoprolol XL 50 mg  daily  Pertinent Lab Values: Serum creatinine 1.28, BUN 29, Potassium 5.5, Sodium 135, BNP 715.8, A1c 5.7, Digoxin level due 11/15  Vital Signs: Weight: 215 lbs (admission weight: 215 lbs) Blood pressure: 110/60s  Heart rate: 60-80s  I/O: net -0.5L yesterday; net -0.6L since admission  Medication Assistance / Insurance Benefits Check: Does the patient have prescription insurance?  Yes Type of insurance plan: Medicare  Outpatient Pharmacy:  Prior to admission outpatient pharmacy: [facility] Is the patient willing to use [facility] TOC pharmacy at discharge? Yes Is the patient willing to transition their outpatient pharmacy to utilize a [facility] outpatient pharmacy?   No    Assessment: 1. Acute on chronic systolic CHF (LVEF 20-25%). Patient declined cardiac cath and prefers medication management only. NYHA class III symptoms. - Continue furosemide 40 mg IV BID. Strict I/Os and daily weights. Keep K>4 and Mg>2. Check magnesium in AM.  - Continue metoprolol XL 25 mg daily, lactic acid normalizing.  - No ACE/ARB/ARNI or MRA with history of hypotension requiring midodrine at home and hyperkalemia.  - Consider adding Farxiga 10 mg daily before discharge to maximize GDMT - Agree with adding digoxin 0.125 mg daily with limited GDMT options and to hopefully prevent future hospitalizations. Digoxin level ordered for 11/15 AM.  Plan: 1) Medication changes recommended at this time: - Continue IV diuresis - Check magnesium in AM - Start Farxiga 10 mg daily  2) Patient assistance: - Has $260 remaining on deductible - Farxiga/Jardiance copay $50-55 once deductible met - Will discuss with CSW about obtaining scale with  auditory features since he has difficulty reading his digital scale.   3)  Education  - Patient has been educated on current HF medications and potential additions to HF medication regimen - Patient verbalizes understanding that over the next few months, these medication  doses may change and more medications may be added to optimize HF regimen - Patient has been educated on basic disease state pathophysiology and goals of therapy - Educated to start obtaining daily weights first thing in the morning after using the restroom to get accurate weights.    Sharen Hones, PharmD, BCPS Heart Failure Stewardship Pharmacist Phone [phone]

## 2023-02-05 NOTE — Progress Notes (Signed)
Heart Failure Navigator Progress Note  Assessed for Heart & Vascular TOC clinic readiness.  Patient EF 20-25%, . Per Dr. Royann Shivers He was seen by palliative care and elected to remain DNR, treat the treatable, and follow with palliative as outpatient. The patient requested to f/u with Dr. Royann Shivers going forward and appt was planned for 02/17/2023.     Navigator will sign off at this time.   Rhae Hammock, BSN, Scientist, clinical (histocompatibility and immunogenetics) Only

## 2023-02-06 DIAGNOSIS — I4821 Permanent atrial fibrillation: Secondary | ICD-10-CM | POA: Diagnosis not present

## 2023-02-06 DIAGNOSIS — I5023 Acute on chronic systolic (congestive) heart failure: Secondary | ICD-10-CM | POA: Diagnosis not present

## 2023-02-06 DIAGNOSIS — I5021 Acute systolic (congestive) heart failure: Secondary | ICD-10-CM | POA: Diagnosis not present

## 2023-02-06 LAB — BASIC METABOLIC PANEL
Anion gap: 7 (ref 5–15)
BUN: 32 mg/dL — ABNORMAL HIGH (ref 8–23)
CO2: 25 mmol/L (ref 22–32)
Calcium: 8.8 mg/dL — ABNORMAL LOW (ref 8.9–10.3)
Chloride: 99 mmol/L (ref 98–111)
Creatinine, Ser: 1.44 mg/dL — ABNORMAL HIGH (ref 0.61–1.24)
GFR, Estimated: 46 mL/min — ABNORMAL LOW (ref >60)
Glucose, Bld: 105 mg/dL — ABNORMAL HIGH (ref 70–99)
Potassium: 3.8 mmol/L (ref 3.5–5.1)
Sodium: 131 mmol/L — ABNORMAL LOW (ref 135–145)

## 2023-02-06 LAB — MAGNESIUM: Magnesium: 2.3 mg/dL (ref 1.7–2.4)

## 2023-02-06 MED ORDER — TORSEMIDE 20 MG PO TABS
20.0000 mg | ORAL_TABLET | Freq: Every day | ORAL | Status: DC
  Administered 2023-02-07: 20 mg via ORAL
  Filled 2023-02-06: qty 1

## 2023-02-06 NOTE — Progress Notes (Signed)
Mobility Specialist Progress Note:    02/06/23 1000  Mobility  Activity Ambulated with assistance in room  Level of Assistance Contact guard assist, steadying assist  Assistive Device Front wheel walker  Distance Ambulated (ft) 6 ft  Activity Response Tolerated well  Mobility Referral Yes  $Mobility charge 1 Mobility  Mobility Specialist Start Time (ACUTE ONLY) 0933  Mobility Specialist Stop Time (ACUTE ONLY) 0946  Mobility Specialist Time Calculation (min) (ACUTE ONLY) 13 min   Pt received in chair agreeable to mobility. No physical assistance needed throughout session. Was able to perform x2 STS as well as 3 steps forwards and backwards. Returned to chair w/o fault. All needs met w/ call bell and personal belongings in reach.   Thompson Grayer Mobility Specialist  Please contact vis Secure Chat or  Rehab Office [phone]

## 2023-02-06 NOTE — Progress Notes (Signed)
Heart Failure Stewardship Pharmacist Progress Note   PCP: [name], MD PCP-Cardiologist: None    HPI:  89 yo M with PMH of HTN, HLD, HFrEF, MR, TR, pulmonary HTN, presumed CAD, afib/flutter, carotid artery disease, neuropathy, BPH, GERD, normocytic anemia, and polymyalgia rheumatica.   Admitted 11/2022 with new HFrEF, EF 20-25%, RV mildly reduced. Seen by Dr. Algie Coffer and declined cardiac cath. He was discharged on torsemide, metoprolol XL, and benazepril.  Readmitted 12/2022 with nausea and diaphoresis, found to have cardiogenic shock, AKI, and hyperkalemia. Required ICU stay with pressors. [facility] was consulted. He was diuresed and discharged on limited GDMT due to hypotension requiring midodrine.   He was seen by outpatient palliative and elected to remain DNR and continue medication management. Seen by PCP on 11/5 and reported stable weights and reported compliance with fluid restriction.  Presented to the ED on 11/12 with shortness of breath, orthopnea, and LE edema. Hypoxic to 70-80s on arrival despite O2 and was transitioned to BiPAP. Found to be in afib RVR. Lactic acid 4.7 and BNP 715. CXR with pulmonary edema. Lactic acid has improved to 1.3 on 11/13.   Reports he is feeling somewhat better. Still on 2L O2 (not on any oxygen at home). Weighs himself daily but after he is dressed and has breakfast. States his dry weight is around 215 lbs. Has difficulty reading his scale and lives alone. Has an aid come to his house. Reports urinary incontinence when in the hospital but denies it being an issue at home. Is able to make it to the bathroom in time when he has to urinate. Uses a walker. [employer] delivers medications to his home.   Feels "worn out" after working with PT this morning. Prefers to keep the interaction short today so he can get some rest.   Current HF Medications: Diuretic: torsemide 20 mg daily - starting 11/15 Beta Blocker: metoprolol XL 25 mg  daily SGLT2i: Farxiga 10 mg daily Other: digoxin 0.125 mg daily  Prior to admission HF Medications: Diuretic: torsemide 20 mg daily Beta blocker: metoprolol XL 50 mg daily  Pertinent Lab Values: Serum creatinine 1.44, BUN 32, Potassium 3.8, Sodium 131, BNP 715.8, A1c 5.7, Digoxin level due 11/15  Vital Signs: Weight: 208 lbs (admission weight: 215 lbs) Blood pressure: 110/60s  Heart rate: 60-70s  I/O: net -2.1L yesterday; net -3L since admission  Medication Assistance / Insurance Benefits Check: Does the patient have prescription insurance?  Yes Type of insurance plan: Medicare  Outpatient Pharmacy:  Prior to admission outpatient pharmacy: [facility] Is the patient willing to use [facility] TOC pharmacy at discharge? Yes Is the patient willing to transition their outpatient pharmacy to utilize a [facility] outpatient pharmacy?   No    Assessment: 1. Acute on chronic systolic CHF (LVEF 20-25%). Patient declined cardiac cath and prefers medication management only. NYHA class II-III symptoms. - Agree with transitioning from furosemide 40 mg IV BID to torsemide 20 mg daily tomorrow. Strict I/Os and daily weights. Keep K>4 and Mg>2. - Continue metoprolol XL 25 mg daily, lactic acid normalizing.  - No ACE/ARB/ARNI or MRA with history of hypotension requiring midodrine at home and hyperkalemia.  - Continue Farxiga 10 mg daily  - Continue digoxin 0.125 mg daily with limited GDMT options and to hopefully prevent future hospitalizations. Digoxin level ordered for 11/15 AM.  Plan: 1) Medication changes recommended at this time: - Agree with changes - Follow up digoxin level tomorrow  2) Patient  assistance: - Has $260 remaining on deductible - Farxiga/Jardiance copay $50-55 once deductible met - Discussing with CSW about obtaining scale with auditory features since he has difficulty reading his digital scale. Patient agreeable to pay for scale if we can help arrange ordering and  delivery to his home.   3)  Education  - Patient has been educated on current HF medications and potential additions to HF medication regimen - Patient verbalizes understanding that over the next few months, these medication doses may change and more medications may be added to optimize HF regimen - Patient has been educated on basic disease state pathophysiology and goals of therapy - Educated to start obtaining daily weights first thing in the morning after using the restroom to get accurate weights.    Sharen Hones, PharmD, BCPS Heart Failure Stewardship Pharmacist Phone [phone]

## 2023-02-06 NOTE — Progress Notes (Signed)
PROGRESS NOTE  Darin [name]  DOB: [birth year]  PCP: [name], MD YQI:[id number]  DOA: 02/04/2023  LOS: 2 days  Hospital Day: 3  Brief narrative: Darin [name] is a 89 y.o. male with PMH significant for HTN, HLD, chronic systolic CHF EF 20 to 25%, persistent A-fib, CHF, neuropathy, polymyalgia rheumatica who was recently hospitalized 3 weeks ago for cardiogenic shock, medically managed, optimized and discharged home on GDMT. 11/12, patient was brought to the ED from home by EMS with complaint of shortness of breath since last night.  Also has progressively worsening bilateral lower extremity edema.  In the ED, patient was tachycardic to 110s, tachypneic to 20s requiring 8 L oxygen by nasal cannula. Patient oxygen saturation dropped down to 70s and later he was put on BiPAP. Labs with WC count 11.2, BNP 716, BUN/creatinine 30/1.36 Chest x-ray showed diffuse pulm edema, pulmonary vascular congestion, bilateral pleural effusion suggestive of CHF EKG at A-fib with RVR at the rate of 116 bpm He was given 1 dose of IV Lasix 60 mg. [facility] was consulted. Lactic acid level came elevated at 4.7.  Cardiology was consulted with concern of cardiogenic shock.  Subjective: Patient was seen and examined this morning. Sitting up in recliner.  Not in distress.  3 L oxygen.  Took few steps with PT yesterday. Remains on IV diuresis.  Cardiology following.  Assessment/Plan: Acute exacerbation of CHF Recent hospitalization for cardiogenic shock Moderate MR Moderate to severe TR Pulm hypertension due to left heart failure Presented with progressive shortness of breath, bilateral pedal edema Most recent echo from 12/04/2022 with EF 20 to 25%, moderate MR, moderate to severe TR and moderately elevated pulm artery systolic pressure BNP elevated to 700, creatinine mildly elevated.  Lactic acid elevated to 4.7. Patient is currently getting diuresed with IV Lasix. Cardiology following. Continue  Toprol 25 mg daily and digoxin. Net IO Since Admission: -3,020 mL [02/06/23 1123] Continue to monitor for daily intake output, weight, blood pressure, BNP, renal function and electrolytes. Recent Labs  Lab 02/04/23 0802 02/05/23 0351 02/06/23 0422  BNP 715.8*  --   --   BUN 30* 29* 32*  CREATININE 1.36* 1.28* 1.44*  NA 137 135 131*  K 4.6 5.5* 3.8  MG  --   --  2.3   A-fib with RVR  Has history of persistent A-fib with slow ventricular response.   Presented with tachycardia in the setting of CHF flareup EKG at A-fib with RVR at the rate of 116 bpm Heart rate improved with metoprolol, digoxin Does not seem to be on long-term anticoagulation.  CAD/HLD Recently had NSTEMI in September 2024.  Patient did not want to pursue LHC. PTA meds-Toprol, Plavix, Zetia.  CKD 3b Creatinine trend as below.  Had AKI last hospitalization after which creatinine improved but plateaued at CKD 3B level.  Currently creatinine is stable. Continue to monitor with diuresis. Recent Labs    12/05/22 0309 12/06/22 0340 01/21/23 1704 01/22/23 0249 01/22/23 1456 01/23/23 0311 01/25/23 0450 02/04/23 0802 02/05/23 0351 02/06/23 0422  BUN 23 23 77* 75* 67* 59* 27* 30* 29* 32*  CREATININE 0.96 0.97 2.28* 2.17* 2.10* 2.07* 1.36* 1.36* 1.28* 1.44*   Hypothyroidism Not sure if patient is compliant to Synthroid at home as TSH seems to be rising up Currently on Synthroid 25 mcg daily.  I increased it to 50 mg daily yesterday. Recent Labs    12/04/22 0331 01/21/23 1704 01/25/23 0450 02/04/23 0802  TSH 4.762* 51.031* 38.951* 46.641*  Neuropathy Continue Neurontin  polymyalgia rheumatica  Does not seem to be on chronic steroids.  BPH Finasteride  Mobility: Limited ambulation.  Uses walker at baseline.  PT eval obtained.  Home health PT recommended.  Goals of care   Code Status: Limited: Do not attempt resuscitation (DNR) -DNR-LIMITED -Do Not Intubate/DNI     DVT prophylaxis:  heparin  injection 5,000 Units Start: 02/04/23 1400   Antimicrobials: None Fluid: None Consultants: Cardiology Family Communication: None at bedside.  Status: Inpatient Level of care:  Progressive   Patient is from: Home Needs to continue in-hospital care: Ongoing IV diuresis Anticipated d/c to: Home with home with PT hopefully in 1 to 2 days    Diet:  Diet Order             Diet 2 gram sodium Room service appropriate? Yes; Fluid consistency: Thin  Diet effective now                   Scheduled Meds:  clopidogrel  75 mg Oral Daily   dapagliflozin propanediol  10 mg Oral Daily   digoxin  0.125 mg Oral Daily   ezetimibe  10 mg Oral Daily   finasteride  5 mg Oral Daily   gabapentin  200 mg Oral BID   heparin  5,000 Units Subcutaneous Q8H   levothyroxine  50 mcg Oral QAC breakfast   metoprolol succinate  25 mg Oral Daily   [START ON 02/07/2023] torsemide  20 mg Oral Daily    PRN meds: acetaminophen **OR** acetaminophen, albuterol, hydrALAZINE, melatonin, polyethylene glycol   Infusions:    Antimicrobials: Anti-infectives (From admission, onward)    None       Objective: Vitals:   02/06/23 0850 02/06/23 0900  BP: (!) 111/53   Pulse: 80   Resp: 18   Temp: 98 F (36.7 C)   SpO2: 96% 96%    Intake/Output Summary (Last 24 hours) at 02/06/2023 1123 Last data filed at 02/06/2023 0036 Gross per 24 hour  Intake 500 ml  Output 2900 ml  Net -2400 ml   Filed Weights   02/04/23 2329 02/05/23 0335 02/06/23 0357  Weight: 97.8 kg 97.9 kg 94.6 kg   Weight change: -3.2 kg Body mass index is 27.52 kg/m.   Physical Exam:  General exam: Pleasant elderly Caucasian male.  Not in pain Skin: No rashes, lesions or ulcers. HEENT: Atraumatic, normocephalic, no obvious bleeding Lungs: Improving bilateral air entry CVS: Regular rate and rhythm, no murmur GI/Abd soft, nontender, nondistended, bowel sound present CNS: Alert, awake, able to answer orientation  questions Psychiatry: Mood appropriate Extremities: Improving pedal edema 1+ bilaterally.  Chronic fungal infection both feet  Data Review: I have personally reviewed the laboratory data and studies available.  F/u labs ordered Unresulted Labs (From admission, onward)     Start     Ordered   02/07/23 0500  Digoxin level  Once,   R        02/04/23 1714   02/06/23 0500  Basic metabolic panel  Daily,   R     Question:  Specimen collection method  Answer:  Lab=Lab collect   02/05/23 1003            Total time spent in review of labs and imaging, patient evaluation, formulation of plan, documentation and communication with family: 45 minutes  Signed, Lorin Glass, MD [facility] 02/06/2023

## 2023-02-06 NOTE — Progress Notes (Signed)
   Patient Name: Darin [name] Date of Encounter: 02/06/2023 [facility] Cardiologist: Ryleeann Urquiza  Interval Summary  .    Breathing much better. Excellent diuresis, net -2 L yesterday, 3 L since admission. Good ventricular rate control. Mild hyponatremia and slight worsening of renal parameters.  Vital Signs .    Vitals:   02/05/23 2016 02/06/23 0032 02/06/23 0357 02/06/23 0850  BP: (!) 107/55 (!) 103/58 110/62 (!) 111/53  Pulse: 67 65  80  Resp: 18 17 18 18   Temp: (!) 97.5 F (36.4 C) 97.6 F (36.4 C) 98 F (36.7 C) 98 F (36.7 C)  TempSrc: Oral Oral Oral Oral  SpO2: 95% 95% 95% 96%  Weight:   94.6 kg   Height:        Intake/Output Summary (Last 24 hours) at 02/06/2023 0924 Last data filed at 02/06/2023 0036 Gross per 24 hour  Intake 500 ml  Output 2900 ml  Net -2400 ml      02/06/2023    3:57 AM 02/05/2023    3:35 AM 02/04/2023   11:29 PM  Last 3 Weights  Weight (lbs) 208 lb 8.9 oz 215 lb 13.3 oz 215 lb 9.8 oz  Weight (kg) 94.6 kg 97.9 kg 97.8 kg      Telemetry/ECG    Atrial fibrillation with good ventricular rate control- Personally Reviewed  Physical Exam .   GEN: No acute distress.   Neck: No JVD Cardiac: irregular, no murmurs, rubs, or gallops.  Respiratory: Clear to auscultation bilaterally. GI: Soft, nontender, non-distended  MS: No edema  Assessment & Plan .     Farxiga added yesterday.   BP has tolerated the diuresis well, on midodrine. Mild hyponatremia and slight worsening of renal parameters. Would switch to p.o. diuretics starting tomorrow and wait for electrolytes and digoxin level tomorrow before discharge.  For questions or updates, please contact [facility] Please consult [email or web address] for contact info under        Signed, Thurmon Fair, MD

## 2023-02-06 NOTE — Plan of Care (Signed)
  Problem: Education: Goal: Knowledge of General Education information will improve Description: Including pain rating scale, medication(s)/side effects and non-pharmacologic comfort measures Outcome: Progressing   Problem: Clinical Measurements: Goal: Diagnostic test results will improve Outcome: Progressing   Problem: Activity: Goal: Risk for activity intolerance will decrease Outcome: Progressing   

## 2023-02-06 NOTE — Progress Notes (Signed)
RT note.  Patient on [place] sat 96% with no labored breathing noted. Patient not requiring bipap at this time. RT will continue to monitor, and place patient on bipap if needed later.   02/06/23 0900  Oxygen Therapy/Pulse Ox  O2 Device Nasal Cannula  O2 Therapy Oxygen  O2 Flow Rate (L/min) 2 L/min  SpO2 96 %

## 2023-02-07 ENCOUNTER — Other Ambulatory Visit (HOSPITAL_COMMUNITY): Payer: Self-pay

## 2023-02-07 DIAGNOSIS — I5021 Acute systolic (congestive) heart failure: Secondary | ICD-10-CM | POA: Diagnosis not present

## 2023-02-07 DIAGNOSIS — I5023 Acute on chronic systolic (congestive) heart failure: Secondary | ICD-10-CM | POA: Diagnosis not present

## 2023-02-07 DIAGNOSIS — I4821 Permanent atrial fibrillation: Secondary | ICD-10-CM | POA: Diagnosis not present

## 2023-02-07 LAB — BASIC METABOLIC PANEL
Anion gap: 9 (ref 5–15)
BUN: 29 mg/dL — ABNORMAL HIGH (ref 8–23)
CO2: 26 mmol/L (ref 22–32)
Calcium: 9.5 mg/dL (ref 8.9–10.3)
Chloride: 102 mmol/L (ref 98–111)
Creatinine, Ser: 1.08 mg/dL (ref 0.61–1.24)
GFR, Estimated: >60 mL/min (ref >60)
Glucose, Bld: 98 mg/dL (ref 70–99)
Potassium: 4.9 mmol/L (ref 3.5–5.1)
Sodium: 137 mmol/L (ref 135–145)

## 2023-02-07 LAB — DIGOXIN LEVEL: Digoxin Level: 0.6 ng/mL — ABNORMAL LOW (ref 0.8–2.0)

## 2023-02-07 MED ORDER — LEVOTHYROXINE SODIUM 50 MCG PO TABS
50.0000 ug | ORAL_TABLET | Freq: Every day | ORAL | 2 refills | Status: DC
  Filled 2023-02-07: qty 30, 30d supply, fill #0

## 2023-02-07 MED ORDER — DAPAGLIFLOZIN PROPANEDIOL 10 MG PO TABS
10.0000 mg | ORAL_TABLET | Freq: Every day | ORAL | 2 refills | Status: DC
  Filled 2023-02-07: qty 30, 30d supply, fill #0

## 2023-02-07 MED ORDER — DIGOXIN 125 MCG PO TABS
0.1250 mg | ORAL_TABLET | Freq: Every day | ORAL | 2 refills | Status: AC
  Filled 2023-02-07 – 2023-04-11 (×2): qty 30, 30d supply, fill #0
  Filled 2023-05-02 – 2023-05-05 (×2): qty 30, 30d supply, fill #1

## 2023-02-07 MED ORDER — METOPROLOL SUCCINATE ER 25 MG PO TB24
25.0000 mg | ORAL_TABLET | Freq: Every day | ORAL | 2 refills | Status: DC
  Filled 2023-02-07: qty 30, 30d supply, fill #0

## 2023-02-07 NOTE — Progress Notes (Signed)
   Patient Name: Trevius Kemper Date of Encounter: 02/07/2023 [facility] Cardiologist: Safiyyah Vasconez  Interval Summary  .    Appears well this morning.  Uncomfortable in chair, but denies shortness of breath or dizziness. Good ventricular rate control (60s-70s while in bed, peaking out at 115-120 when mobilizing to the chair). Still with excellent net diuresis after switching to oral agents. Digoxin level nontoxic, creatinine has returned to normal range, no longer hyponatremic.  Maintaining high normal potassium without supplements.  Vital Signs .    Vitals:   02/07/23 0025 02/07/23 0427 02/07/23 0510 02/07/23 0838  BP: 129/64 137/70  114/73  Pulse: 67 67  70  Resp: 18 18  18   Temp: 97.8 F (36.6 C) 97.7 F (36.5 C)  98.1 F (36.7 C)  TempSrc: Oral Oral  Oral  SpO2: 97% 99%  93%  Weight:   90.7 kg   Height:        Intake/Output Summary (Last 24 hours) at 02/07/2023 0934 Last data filed at 02/07/2023 0428 Gross per 24 hour  Intake 240 ml  Output 2950 ml  Net -2710 ml      02/07/2023    5:10 AM 02/06/2023    3:57 AM 02/05/2023    3:35 AM  Last 3 Weights  Weight (lbs) 199 lb 15.3 oz 208 lb 8.9 oz 215 lb 13.3 oz  Weight (kg) 90.7 kg 94.6 kg 97.9 kg      Telemetry/ECG    Atrial fibrillation with controlled ventricular response- Personally Reviewed  Physical Exam .   GEN: No acute distress.   Neck: No JVD Cardiac: Irregular, no murmurs, rubs, or gallops.  Respiratory: Clear to auscultation bilaterally. GI: Soft, nontender, non-distended  MS: No edema  Assessment & Plan .     Appears to be well compensated hemodynamically.  I think he is ready for discharge.  [facility] will sign off.   Medication Recommendations:   Digoxin 0.125 mg daily Torsemide 20 mg daily Farxiga 10 mg daily Clopidogrel 75 mg daily Ezetimibe 10 mg daily Other recommendations (labs, testing, etc):  Will repeat a basic metabolic panel and digoxin level when he comes  in for his appointment on 02/17/2023.  I asked him not to take his digoxin that morning until we draw the labs. Monitor daily weights.  Call office if weight increases by 3 pounds in 24 hours or if it ever increases to more than 5 pounds from today's weight. On the hospital bed scale today he weighs exactly 200 pounds.  Pointed out that his home scale may show a different number that he should note as soon as he gets home. Follow up as an outpatient:  02/17/2023 at 0940h at the [facility] office  For questions or updates, please contact [place] Please consult [email or web address] for contact info under        Signed, Thurmon Fair, MD

## 2023-02-07 NOTE — Progress Notes (Signed)
Heart Failure Stewardship Pharmacist Progress Note   PCP: [name], MD PCP-Cardiologist: None    HPI:  89 yo M with PMH of HTN, HLD, HFrEF, MR, TR, pulmonary HTN, presumed CAD, afib/flutter, carotid artery disease, neuropathy, BPH, GERD, normocytic anemia, and polymyalgia rheumatica.   Admitted 11/2022 with new HFrEF, EF 20-25%, RV mildly reduced. Seen by Dr. Algie Coffer and declined cardiac cath. He was discharged on torsemide, metoprolol XL, and benazepril.  Readmitted 12/2022 with nausea and diaphoresis, found to have cardiogenic shock, AKI, and hyperkalemia. Required ICU stay with pressors. [facility] was consulted. He was diuresed and discharged on limited GDMT due to hypotension requiring midodrine.   He was seen by outpatient palliative and elected to remain DNR and continue medication management. Seen by PCP on 11/5 and reported stable weights and reported compliance with fluid restriction.  Presented to the ED on 11/12 with shortness of breath, orthopnea, and LE edema. Hypoxic to 70-80s on arrival despite O2 and was transitioned to BiPAP. Found to be in afib RVR. Lactic acid 4.7 and BNP 715. CXR with pulmonary edema. Lactic acid has improved to 1.3 on 11/13.   Weighs himself daily but after he is dressed and has breakfast. States his dry weight is around 215 lbs. Has difficulty reading his scale and lives alone. Has an aid come to his house. Reports urinary incontinence when in the hospital but denies it being an issue at home. Is able to make it to the bathroom in time when he has to urinate. Uses a walker. [employer] delivers medications to his home.   Volume status improved, off oxygen. LE edema resolved. His aid will help update his pill box when he is discharged. Understands not to take digoxin the morning of his cardiology follow up appt.   Current HF Medications: Diuretic: torsemide 20 mg daily Beta Blocker: metoprolol XL 25 mg daily SGLT2i: Farxiga 10 mg  daily Other: digoxin 0.125 mg daily  Prior to admission HF Medications: Diuretic: torsemide 20 mg daily Beta blocker: metoprolol XL 50 mg daily  Pertinent Lab Values: Serum creatinine 1.08, BUN 29, Potassium 4.9, Sodium 137, BNP 715.8, A1c 5.7, Magnesium 2.3, Digoxin level 0.6 (11/15)  Vital Signs: Weight: 199 lbs (admission weight: 215 lbs) Blood pressure: 110-130/70s  Heart rate: 60s  I/O: net -2.4L yesterday; net -5.7L since admission  Medication Assistance / Insurance Benefits Check: Does the patient have prescription insurance?  Yes Type of insurance plan: Medicare  Outpatient Pharmacy:  Prior to admission outpatient pharmacy: [facility] Is the patient willing to use [facility] TOC pharmacy at discharge? Yes Is the patient willing to transition their outpatient pharmacy to utilize a [facility] outpatient pharmacy?   No    Assessment: 1. Acute on chronic systolic CHF (LVEF 20-25%). Patient declined cardiac cath and prefers medication management only. NYHA class II symptoms. - Continue torsemide 20 mg daily. Strict I/Os and daily weights. Keep K>4 and Mg>2. - Continue metoprolol XL 25 mg daily, lactic acid normalizing.  - No ACE/ARB/ARNI or MRA with history of hypotension requiring midodrine at home and hyperkalemia.  - Continue Farxiga 10 mg daily  - Continue digoxin 0.125 mg daily with limited GDMT options and to hopefully prevent future hospitalizations. Digoxin level 0.6 on 11/15 AM.  Plan: 1) Medication changes recommended at this time: - Continue current regimen  2) Patient assistance: - Has $260 remaining on deductible - Farxiga/Jardiance copay $50-55 once deductible met - Discussing with CSW about obtaining scale with  auditory features since he has difficulty reading his digital scale. HCPOA contacted and will order scale to be delivered to his home.   3)  Education  - Patient has been educated on current HF medications and potential additions to HF medication  regimen - Patient verbalizes understanding that over the next few months, these medication doses may change and more medications may be added to optimize HF regimen - Patient has been educated on basic disease state pathophysiology and goals of therapy - Educated to start obtaining daily weights first thing in the morning after using the restroom to get accurate weights.    Sharen Hones, PharmD, BCPS Heart Failure Stewardship Pharmacist Phone [phone]

## 2023-02-07 NOTE — TOC Progression Note (Signed)
Transition of Care [facility]) - Progression Note    Patient Details  Name: Darin [name] MRN: [id number] Date of Birth: 1931/04/21  [facility] CM/SW Contact  Elliot Cousin, RN Phone Number: [phone] 02/07/2023, 8:43 AM  Clinical Narrative:  Referral received from [facility] HF navigator, pt will need an auditory scale for home. Contacted pt's caregiver Cordelia Pen, states she will schedule to come in before 8 am, to ensure weight is complete. States is scheduled for 5 days, but patient has added Saturday. He has 8 hours per day caregiver support in the home. Contacted pt's HCPOA, Mr Rennis Harding, he will order auditory scale and have delivered to the home from [facility]. Discussed the importance of daily weight.     Expected Discharge Plan: Home w Home Health Services Barriers to Discharge: Continued Medical Work up  Expected Discharge Plan and Services In-house Referral: NA Discharge Planning Services: CM Consult Post Acute Care Choice: Home Health, Resumption of Svcs/PTA Provider Living arrangements for the past 2 months: Single Family Home                 DME Arranged: N/A DME Agency: NA       HH Arranged: RN, PT, OT, Disease Management HH Agency: [facility] Date [facility] Agency Contacted: 02/05/23 Time HH Agency Contacted: 1053 Representative spoke with at [facility] Agency: Kandee Keen   Social Determinants of Health (SDOH) Interventions SDOH Screenings   Food Insecurity: No Food Insecurity (02/04/2023)  Housing: Low Risk  (02/04/2023)  Transportation Needs: No Transportation Needs (02/04/2023)  Utilities: Not At Risk (02/04/2023)  Alcohol Screen: Low Risk  (08/30/2022)  Depression (PHQ2-9): Low Risk  (08/30/2022)  Financial Resource Strain: Low Risk  (08/30/2022)  Physical Activity: Sufficiently Active (08/30/2022)  Social Connections: Moderately Integrated (08/30/2022)  Stress: No Stress Concern Present (08/30/2022)  Tobacco Use: Low Risk  (02/04/2023)    Readmission Risk  Interventions     No data to display

## 2023-02-07 NOTE — Progress Notes (Signed)
Physical Therapy Treatment Patient Details Name: Darin [name] MRN: [id number] DOB: [birth year] Today's Date: 02/07/2023   History of Present Illness Patient is a 89 year old male with shortness of breath, LE edema, admitted with acute CHF. History of HTN, HLD, HFrEF, MR, TR, pulmonary HTN, presumed CAD, afib/flutter, neuropathy, BPH, GERD, normocytic anemia, polymyalgia rheumatica    PT Comments  Pt was able to make good functional progress this date as he was able to perform transfers and ambulate a few ft at a time with a RW for support and only CGA for safety. He did have a posterior LOB, resulting in him sitting back down on the EOB, during his first transfer attempt. However, this seemed to be due to pt not being scooted far enough anteriorly on the edge of the bed. Cues provided to correct this and pt was able to transfer to stand with improved ease and stability upon his next attempt. Educated pt on reducing intake of sodium and processed foods, on eating fresh/frozen fruits/veggies or rinsing canned foods, progressing mobility, increasing frequency of mobility, and weighing self daily to manage his CHF. He verbalized understanding. Will continue to follow acutely.    If plan is discharge home, recommend the following: A little help with walking and/or transfers;A little help with bathing/dressing/bathroom;Assistance with cooking/housework;Assist for transportation   Can travel by private vehicle     Yes  Equipment Recommendations  None recommended by PT    Recommendations for Other Services       Precautions / Restrictions Precautions Precautions: Fall Precaution Comments: monitor Sp02 Restrictions Weight Bearing Restrictions: No     Mobility  Bed Mobility Overal bed mobility: Needs Assistance Bed Mobility: Supine to Sit     Supine to sit: Supervision     General bed mobility comments: Extra time required, but pt able to transition supine to sit R EOB with bed flat and  without bed rails without assistance to simulate home set-up.    Transfers Overall transfer level: Needs assistance Equipment used: Rolling walker (2 wheels) Transfers: Sit to/from Stand, Bed to chair/wheelchair/BSC Sit to Stand: Contact guard assist   Step pivot transfers: Contact guard assist       General transfer comment: Pt did have a posterior LOB upon the first attempt to stand, resulting in him sitting right back down on the EOB. Cued pt to scoot closer to the edge to facilitate an anterior weight shift when transferring. Improved ease and stability with noted success the second attempt. CGA for safety standing from EOB 1x, from recliner 1x, and from commode 1x. CGA for step pivoting EOB > recliner then recliner <> commode with RW support.    Ambulation/Gait Ambulation/Gait assistance: Contact guard assist Gait Distance (Feet): 4 Feet (x3 bouts of ~3-4 ft each bout) Assistive device: Rolling walker (2 wheels) Gait Pattern/deviations: Step-through pattern, Decreased step length - right, Decreased step length - left, Decreased stride length, Shuffle, Trunk flexed Gait velocity: reduced Gait velocity interpretation: <1.31 ft/sec, indicative of household ambulator   General Gait Details: Pt takes slow, small shuffling steps to pivot between surfaces with a RW for support. No LOB, CGA for safety   Stairs             Wheelchair Mobility     Tilt Bed    Modified Rankin (Stroke Patients Only)       Balance Overall balance assessment: Needs assistance Sitting-balance support: Feet supported Sitting balance-Leahy Scale: Fair     Standing balance support: Bilateral upper  extremity supported Standing balance-Leahy Scale: Poor Standing balance comment: Pt relies on RW for support. At baseline, pt relies heavily on grab bars in bathroom for support while performing pericare. He did not feel secure enough to try pericare with the RW here but feels he could do it with the  grab bars at home                            Cognition Arousal: Alert Behavior During Therapy: [facility] for tasks assessed/performed Overall Cognitive Status: Within Functional Limits for tasks assessed                                          Exercises      General Comments General comments (skin integrity, edema, etc.): educated pt on reducing intake of sodium and processed foods, on eating fresh/frozen fruits/veggies or rinsing canned foods, progressing mobility, increasing frequency of mobility, and weighing self daily - he verbalized understanding      Pertinent Vitals/Pain Pain Assessment Pain Assessment: No/denies pain    Home Living                          Prior Function            PT Goals (current goals can now be found in the care plan section) Acute Rehab PT Goals Patient Stated Goal: to return home PT Goal Formulation: With patient Time For Goal Achievement: 02/12/23 Potential to Achieve Goals: Good Progress towards PT goals: Progressing toward goals    Frequency    Min 1X/week      PT Plan      Co-evaluation              AM-PAC PT "6 Clicks" Mobility   Outcome Measure  Help needed turning from your back to your side while in a flat bed without using bedrails?: None Help needed moving from lying on your back to sitting on the side of a flat bed without using bedrails?: A Little Help needed moving to and from a bed to a chair (including a wheelchair)?: A Little Help needed standing up from a chair using your arms (e.g., wheelchair or bedside chair)?: A Little Help needed to walk in hospital room?: Total (<20 ft) Help needed climbing 3-5 steps with a railing? : Total 6 Click Score: 15    End of Session Equipment Utilized During Treatment: Gait belt Activity Tolerance: Patient tolerated treatment well Patient left: with call bell/phone within reach;in chair   PT Visit Diagnosis: Unsteadiness on feet  (R26.81);Muscle weakness (generalized) (M62.81);Other abnormalities of gait and mobility (R26.89)     Time: 2956-2130 PT Time Calculation (min) (ACUTE ONLY): 20 min  Charges:    $Therapeutic Activity: 8-22 mins PT General Charges $$ ACUTE PT VISIT: 1 Visit                     Virgil Benedict, PT, DPT Acute Rehabilitation Services  Office: [phone]    Darin [name] 02/07/2023, 11:28 AM

## 2023-02-07 NOTE — TOC Transition Note (Addendum)
Transition of Care [facility]) - CM/SW Discharge Note   Patient Details  Name: Darin [name] MRN: [id number] Date of Birth: 1931-11-30  Transition of Care [facility]) CM/SW Contact:  [name], RN Phone Number: 02/07/2023, 11:08 AM   Clinical Narrative:    For dc today, ptar has been scheduled, DNR form is in the dc packet.  Will need to call his aide Cordelia Pen  [phone] ,when ptar arrives so that she will be on her way to the house.  NCM notified [facility] and [facility] of dc today.  Ptar is here, NCM called Cordelia Pen to let her know, she is on her way to the house.   Final next level of care: Home w Home Health Services Barriers to Discharge: Continued Medical Work up   Patient Goals and CMS Choice   Choice offered to / list presented to : NA  Discharge Placement                         Discharge Plan and Services Additional resources added to the After Visit Summary for   In-house Referral: NA Discharge Planning Services: CM Consult Post Acute Care Choice: Home Health, Resumption of Svcs/PTA Provider          DME Arranged: N/A DME Agency: NA       HH Arranged: RN, PT, OT, Disease Management HH Agency: [facility] Date [facility] Agency Contacted: 02/05/23 Time HH Agency Contacted: 1053 Representative spoke with at [facility] Agency: Kandee Keen  Social Determinants of Health (SDOH) Interventions SDOH Screenings   Food Insecurity: No Food Insecurity (02/04/2023)  Housing: Low Risk  (02/04/2023)  Transportation Needs: No Transportation Needs (02/04/2023)  Utilities: Not At Risk (02/04/2023)  Alcohol Screen: Low Risk  (08/30/2022)  Depression (PHQ2-9): Low Risk  (08/30/2022)  Financial Resource Strain: Low Risk  (08/30/2022)  Physical Activity: Sufficiently Active (08/30/2022)  Social Connections: Moderately Integrated (08/30/2022)  Stress: No Stress Concern Present (08/30/2022)  Tobacco Use: Low Risk  (02/04/2023)     Readmission Risk Interventions     No data to display

## 2023-02-07 NOTE — Progress Notes (Signed)
Mobility Specialist Progress Note:    02/07/23 1112  Mobility  Activity Transferred to/from [facility]  Level of Assistance Contact guard assist, steadying assist  Assistive Device Front wheel walker  Distance Ambulated (ft) 6 ft  Activity Response Tolerated well  Mobility Referral Yes  $Mobility charge 1 Mobility  Mobility Specialist Start Time (ACUTE ONLY) [id number]  Mobility Specialist Stop Time (ACUTE ONLY) 0932  Mobility Specialist Time Calculation (min) (ACUTE ONLY) 10 min   Pt received in chair requesting to use the BSC. Was able to stand and transfer to the [facility] w/o physical assistance. No c/o throughout. Left on [facility] w/ call bell and personal belongings in reach. Educated to use call bell when finished.   Thompson Grayer Mobility Specialist  Please contact vis Secure Chat or  Rehab Office [phone]

## 2023-02-07 NOTE — Care Management Important Message (Signed)
Important Message  Patient Details  Name: Darin [name] MRN: [id number] Date of Birth: 09-28-1931   Important Message Given:  Yes - Medicare IM  Patient left prior to IM delivery will mail a copy to the patient home address.    Bejamin Hackbart 02/07/2023, 4:06 PM

## 2023-02-07 NOTE — Discharge Summary (Signed)
Physician Discharge Summary  Darin [name] ZOX:[id number] DOB: 04/20/1931 DOA: 02/04/2023  PCP: [name], MD  Admit date: 02/04/2023 Discharge date: 02/07/2023  Admitted From: Home Discharge disposition: Home with home health  Recommendations at discharge:  Encourage compliance to cardiac meds. Follow-up with cardiology as an outpatient.  Brief narrative: Darin [name] is a 89 y.o. male with PMH significant for HTN, HLD, chronic systolic CHF EF 20 to 25%, persistent A-fib, CHF, neuropathy, polymyalgia rheumatica who was recently hospitalized 3 weeks ago for cardiogenic shock, medically managed, optimized and discharged home on GDMT. 11/12, patient was brought to the ED from home by EMS with complaint of shortness of breath since last night.  Also has progressively worsening bilateral lower extremity edema.  In the ED, patient was tachycardic to 110s, tachypneic to 20s requiring 8 L oxygen by nasal cannula. Patient oxygen saturation dropped down to 70s and later he was put on BiPAP. Labs with WC count 11.2, BNP 716, BUN/creatinine 30/1.36 Chest x-ray showed diffuse pulm edema, pulmonary vascular congestion, bilateral pleural effusion suggestive of CHF EKG at A-fib with RVR at the rate of 116 bpm He was given 1 dose of IV Lasix 60 mg. TRH was consulted. Lactic acid level came elevated at 4.7.  Cardiology was consulted with concern of cardiogenic shock.  Subjective: Patient was seen and examined this morning. Propped up in bed.  Not in distress.  Not on supplemental oxygen.  Mentions that he was able to use bedside commode without supplemental oxygen. Diuretics switched to oral today. Cleared by cardiology for discharge today.  Hospital course: Acute exacerbation of CHF Recent hospitalization for cardiogenic shock Moderate MR Moderate to severe TR Pulm hypertension due to left heart failure Presented with progressive shortness of breath, bilateral pedal edema Most  recent echo from 12/04/2022 with EF 20 to 25%, moderate MR, moderate to severe TR and moderately elevated pulm artery systolic pressure BNP was elevated to 700, creatinine mildly elevated.  Lactic acid elevated to 4.7. Patient was diuresed with IV Lasix, more than 5.5 L of negative balance this hospitalization. Medications adjusted per cardiology recommendation. Discharge meds: Digoxin 0.125 mg daily, Torsemide 20 mg daily, Farxiga 10 mg daily. To follow-up with cardiology as an outpatient for digoxin level on 11/25.  A-fib with RVR  Has history of persistent A-fib with slow ventricular response.   Presented with tachycardia in the setting of CHF flareup EKG at A-fib with RVR at the rate of 116 bpm Heart rate improved with metoprolol, digoxin Does not seem to be on long-term anticoagulation.  Per cardiology note from 11/1 last hospitalization, patient declined to be on anticoagulation.  CAD/HLD Recently had NSTEMI in September 2024.  Patient did not want to pursue LHC. Continue Toprol, Plavix, Zetia.  CKD 3b Creatinine trend as below.  Had AKI last hospitalization after which creatinine improved but plateaued at CKD 3B level.  Currently creatinine is stable. Creatinine is improving with diuresis. Recent Labs    12/06/22 0340 01/21/23 1704 01/22/23 0249 01/22/23 1456 01/23/23 0311 01/25/23 0450 02/04/23 0802 02/05/23 0351 02/06/23 0422 02/07/23 0403  BUN 23 77* 75* 67* 59* 27* 30* 29* 32* 29*  CREATININE 0.97 2.28* 2.17* 2.10* 2.07* 1.36* 1.36* 1.28* 1.44* 1.08   Hypothyroidism PTA, patient was on Synthroid 25 mcg daily since last hospitalization.  TSH level not improving. I increased Synthroid dose to 50 mg daily yesterday. Recent Labs    12/04/22 0331 01/21/23 1704 01/25/23 0450 02/04/23 0802  TSH 4.762* 51.031* 38.951* 46.641*  Neuropathy Continue Neurontin  polymyalgia rheumatica  Does not seem to be on chronic steroids.  BPH Finasteride to  continue  Impaired mobility  Has limited ambulation.  Uses walker at baseline.  PT eval obtained.  Home health PT recommended.  Goals of care   Code Status: Limited: Do not attempt resuscitation (DNR) -DNR-LIMITED -Do Not Intubate/DNI    Wounds:  -    Discharge Exam:   Vitals:   02/07/23 0025 02/07/23 0427 02/07/23 0510 02/07/23 0838  BP: 129/64 137/70  114/73  Pulse: 67 67  70  Resp: 18 18  18   Temp: 97.8 F (36.6 C) 97.7 F (36.5 C)  98.1 F (36.7 C)  TempSrc: Oral Oral  Oral  SpO2: 97% 99%  93%  Weight:   90.7 kg   Height:        Body mass index is 26.38 kg/m.  General exam: Pleasant elderly Caucasian male.  Not in pain Skin: No rashes, lesions or ulcers. HEENT: Atraumatic, normocephalic, no obvious bleeding Lungs: Clear to auscultation bilaterally.  Not on supplemental oxygen CVS: Regular rate and rhythm, no murmur GI/Abd soft, nontender, nondistended, bowel sound present CNS: Alert, awake, able to answer orientation questions Psychiatry: Mood appropriate Extremities: Improving pedal edema 1+ bilaterally.  Chronic fungal infection both feet  Follow ups:    Follow-up Information     [facility] Follow up.   Specialty: Home Health Services Why: Agency will contact you to set up apt time Contact information: [address] [place] [address] [phone]         [facility] Follow up.   Why: outpatient palliative services Contact information: [address] [place] [address] [phone]        [name], MD Follow up.   Specialty: Family Medicine Contact information: [address] [place] [address] [phone]                 Discharge Instructions:   Discharge Instructions     Call MD for:  difficulty breathing, headache or visual disturbances   Complete by: As directed    Call MD for:  extreme fatigue   Complete by: As directed    Call MD for:   hives   Complete by: As directed    Call MD for:  persistant dizziness or light-headedness   Complete by: As directed    Call MD for:  persistant nausea and vomiting   Complete by: As directed    Call MD for:  severe uncontrolled pain   Complete by: As directed    Call MD for:  temperature >100.4   Complete by: As directed    Diet - low sodium heart healthy   Complete by: As directed    Discharge instructions   Complete by: As directed    Recommendations at discharge:   Encourage compliance to cardiac meds.  Follow-up with cardiology as an outpatient.  Discharge instructions for CHF Check weight daily -preferably same time every day. Restrict fluid intake to 1200 ml daily Restrict salt intake to less than 2 g daily. Call MD if you have one of the following symptoms 1) 3 pound weight gain in 24 hours or 5 pounds in 1 week  2) swelling in the hands, feet or stomach  3) progressive shortness of breath 4) if you have to sleep on extra pillows at night in order to breathe     General discharge instructions: Follow with Primary MD Burchette,  Elberta Fortis, MD in 7 days  Please request your PCP  to go over your hospital tests, procedures, radiology results at the follow up. Please get your medicines reviewed and adjusted.  Your PCP may decide to repeat certain labs or tests as needed. Do not drive, operate heavy machinery, perform activities at heights, swimming or participation in water activities or provide baby sitting services if your were admitted for syncope or siezures until you have seen by Primary MD or a Neurologist and advised to do so again. [place] Controlled Substance Reporting System database was reviewed. Do not drive, operate heavy machinery, perform activities at heights, swim, participate in water activities or provide baby-sitting services while on medications for pain, sleep and mood until your outpatient physician has reevaluated you and advised to do so again.   You are strongly recommended to comply with the dose, frequency and duration of prescribed medications. Activity: As tolerated with Full fall precautions use walker/cane & assistance as needed Avoid using any recreational substances like cigarette, tobacco, alcohol, or non-prescribed drug. If you experience worsening of your admission symptoms, develop shortness of breath, life threatening emergency, suicidal or homicidal thoughts you must seek medical attention immediately by calling 911 or calling your MD immediately  if symptoms less severe. You must read complete instructions/literature along with all the possible adverse reactions/side effects for all the medicines you take and that have been prescribed to you. Take any new medicine only after you have completely understood and accepted all the possible adverse reactions/side effects.  Wear Seat belts while driving. You were cared for by a hospitalist during your hospital stay. If you have any questions about your discharge medications or the care you received while you were in the hospital after you are discharged, you can call the unit and ask to speak with the hospitalist or the covering physician. Once you are discharged, your primary care physician will handle any further medical issues. Please note that NO REFILLS for any discharge medications will be authorized once you are discharged, as it is imperative that you return to your primary care physician (or establish a relationship with a primary care physician if you do not have one).   Increase activity slowly   Complete by: As directed        Discharge Medications:   Allergies as of 02/07/2023       Reactions   Statins Other (See Comments)   Muscle pain, upset stomach, and leg pain        Medication List     STOP taking these medications    amiodarone 200 MG tablet Commonly known as: PACERONE   amLODipine 5 MG tablet Commonly known as: NORVASC   atovaquone-proguanil  250-100 MG Tabs tablet Commonly known as: MALARONE   benazepril 5 MG tablet Commonly known as: LOTENSIN   meloxicam 15 MG tablet Commonly known as: MOBIC   midodrine 5 MG tablet Commonly known as: PROAMATINE   TART CHERRY PO       TAKE these medications    betamethasone dipropionate 0.05 % cream Apply 1 application  topically as needed (rash).   clopidogrel 75 MG tablet Commonly known as: PLAVIX Take 1 tablet (75 mg total) by mouth daily.   dapagliflozin propanediol 10 MG Tabs tablet Commonly known as: FARXIGA Take 1 tablet (10 mg total) by mouth daily. Start taking on: February 08, 2023   digoxin 0.125 MG tablet Commonly known as: LANOXIN Take 1 tablet (0.125 mg total) by mouth daily.  Start taking on: February 08, 2023   ezetimibe 10 MG tablet Commonly known as: ZETIA Take 1 tablet (10 mg total) by mouth daily. What changed: when to take this   famotidine 20 MG tablet Commonly known as: PEPCID TAKE 1 TABLET AT BEDTIME   finasteride 5 MG tablet Commonly known as: PROSCAR TAKE ONE TABLET BY MOUTH ONCE DAILY   gabapentin 100 MG capsule Commonly known as: NEURONTIN Take 2 capsules in AM and 2 in PM What changed:  how much to take how to take this when to take this additional instructions   levothyroxine 50 MCG tablet Commonly known as: SYNTHROID Take 1 tablet (50 mcg total) by mouth daily before breakfast. Start taking on: February 08, 2023 What changed:  medication strength how much to take   magnesium oxide 400 (240 Mg) MG tablet Commonly known as: MAG-OX Take 400 mg by mouth at bedtime.   metoprolol succinate 25 MG 24 hr tablet Commonly known as: TOPROL-XL Take 1 tablet (25 mg total) by mouth daily. Start taking on: February 08, 2023 What changed: Another medication with the same name was removed. Continue taking this medication, and follow the directions you see here.   polyethylene glycol 17 g packet Commonly known as: MIRALAX /  GLYCOLAX Take 17 g by mouth daily as needed for moderate constipation. What changed: when to take this   torsemide 20 MG tablet Commonly known as: DEMADEX Take 1 tablet (20 mg total) by mouth daily.         The results of significant diagnostics from this hospitalization (including imaging, microbiology, ancillary and laboratory) are listed below for reference.    Procedures and Diagnostic Studies:   DG Chest 2 View  Result Date: 02/04/2023 CLINICAL DATA:  Shortness of breath. EXAM: CHEST - 2 VIEW COMPARISON:  01/21/2023. FINDINGS: There is diffuse pulmonary vascular congestion with bilateral hilar and bibasilar predominance. There is additional left retrocardiac airspace opacity obscuring the left hemidiaphragm, descending thoracic aorta and blunting the left lateral costophrenic angle suggesting combination of left lower lobe atelectasis and/or consolidation with pleural effusion. There are also patchy areas of scarring/atelectasis at the right lung base. Bilateral lung fields are otherwise clear. There is also small right pleural effusion. Stable cardio-mediastinal silhouette. No acute osseous abnormalities. The soft tissues are within normal limits. IMPRESSION: *Findings favor congestive heart failure/pulmonary edema characterized by diffuse pulmonary vascular congestion and bilateral pleural effusions. Electronically Signed   By: Jules Schick M.D.   On: 02/04/2023 09:13     Labs:   Basic Metabolic Panel: Recent Labs  Lab 02/04/23 0802 02/05/23 0351 02/06/23 0422 02/07/23 0403  NA 137 135 131* 137  K 4.6 5.5* 3.8 4.9  CL 102 100 99 102  CO2 22 26 25 26   GLUCOSE 193* 141* 105* 98  BUN 30* 29* 32* 29*  CREATININE 1.36* 1.28* 1.44* 1.08  CALCIUM 9.4 9.6 8.8* 9.5  MG  --   --  2.3  --    GFR Estimated Creatinine Clearance: 52.4 mL/min (by C-G formula based on SCr of 1.08 mg/dL). Liver Function Tests: Recent Labs  Lab 02/04/23 1024  AST 22  ALT 21  ALKPHOS 103   BILITOT 0.4  PROT 7.2  ALBUMIN 3.2*   No results for input(s): "LIPASE", "AMYLASE" in the last 168 hours. No results for input(s): "AMMONIA" in the last 168 hours. Coagulation profile No results for input(s): "INR", "PROTIME" in the last 168 hours.  CBC: Recent Labs  Lab 02/04/23 0802 02/05/23  0351  WBC 11.2* 6.3  NEUTROABS 9.9*  --   HGB 12.7* 11.1*  HCT 41.3 35.1*  MCV 99.0 95.6  PLT 295 246   Cardiac Enzymes: No results for input(s): "CKTOTAL", "CKMB", "CKMBINDEX", "TROPONINI" in the last 168 hours. BNP: Invalid input(s): "POCBNP" CBG: No results for input(s): "GLUCAP" in the last 168 hours. D-Dimer No results for input(s): "DDIMER" in the last 72 hours. Hgb A1c No results for input(s): "HGBA1C" in the last 72 hours. Lipid Profile No results for input(s): "CHOL", "HDL", "LDLCALC", "TRIG", "CHOLHDL", "LDLDIRECT" in the last 72 hours. Thyroid function studies No results for input(s): "TSH", "T4TOTAL", "T3FREE", "THYROIDAB" in the last 72 hours.  Invalid input(s): "FREET3" Anemia work up No results for input(s): "VITAMINB12", "FOLATE", "FERRITIN", "TIBC", "IRON", "RETICCTPCT" in the last 72 hours. Microbiology Recent Results (from the past 240 hour(s))  SARS Coronavirus 2 by RT PCR (hospital order, performed in [facility] hospital lab) *cepheid single result test* Anterior Nasal Swab     Status: None   Collection Time: 02/04/23 11:03 AM   Specimen: Anterior Nasal Swab  Result Value Ref Range Status   SARS Coronavirus 2 by RT PCR NEGATIVE NEGATIVE Final    Comment: Performed at [facility], [address], [place], [place] [address]    Time coordinating discharge: 45 minutes  Signed: Kohle Winner  [facility] 02/07/2023, 10:28 AM

## 2023-02-09 ENCOUNTER — Other Ambulatory Visit: Payer: Self-pay | Admitting: Family Medicine

## 2023-02-10 ENCOUNTER — Other Ambulatory Visit (HOSPITAL_COMMUNITY): Payer: Self-pay

## 2023-02-10 ENCOUNTER — Telehealth: Payer: Self-pay

## 2023-02-10 NOTE — Transitions of Care (Post Inpatient/ED Visit) (Signed)
   02/10/2023  Name: Lilburn Mcclees MRN: [id number] DOB: 1931-04-15  Today's TOC FU Call Status: Today's TOC FU Call Status:: Unsuccessful Call (1st Attempt) Unsuccessful Call (1st Attempt) Date: 02/10/23  Attempted to reach the patient regarding the most recent Inpatient/ED visit.  Follow Up Plan: Additional outreach attempts will be made to reach the patient to complete the Transitions of Care (Post Inpatient/ED visit) call.     Antionette Fairy, RN,BSN,CCM RN Care Manager Transitions of Care  [facility]  Direct Phone: [phone] Toll Free: [phone] Fax: [phone]

## 2023-02-10 NOTE — Transitions of Care (Post Inpatient/ED Visit) (Signed)
   02/10/2023  Name: Darin [name] MRN: [id number] DOB: 02/22/1932  Today's TOC FU Call Status: Today's TOC FU Call Status:: Unsuccessful Call (2nd Attempt) Unsuccessful Call (2nd Attempt) Date: 02/10/23 (Voicemail message received from pt requesting return call on his cell. Return call to pt and no answer-VM message left)  Attempted to reach the patient regarding the most recent Inpatient/ED visit.  Follow Up Plan: Additional outreach attempts will be made to reach the patient to complete the Transitions of Care (Post Inpatient/ED visit) call.     Antionette Fairy, RN,BSN,CCM RN Care Manager Transitions of Care  [facility]  Direct Phone: [phone] Toll Free: [phone] Fax: [phone]

## 2023-02-11 ENCOUNTER — Telehealth: Payer: Self-pay

## 2023-02-11 ENCOUNTER — Ambulatory Visit: Payer: Medicare Other | Admitting: Family Medicine

## 2023-02-11 ENCOUNTER — Encounter: Payer: Self-pay | Admitting: Family Medicine

## 2023-02-11 VITALS — BP 82/40 | HR 67 | Temp 97.4°F | Ht 73.0 in

## 2023-02-11 DIAGNOSIS — E039 Hypothyroidism, unspecified: Secondary | ICD-10-CM | POA: Diagnosis not present

## 2023-02-11 DIAGNOSIS — I081 Rheumatic disorders of both mitral and tricuspid valves: Secondary | ICD-10-CM | POA: Diagnosis not present

## 2023-02-11 DIAGNOSIS — I10 Essential (primary) hypertension: Secondary | ICD-10-CM

## 2023-02-11 DIAGNOSIS — I2722 Pulmonary hypertension due to left heart disease: Secondary | ICD-10-CM | POA: Diagnosis not present

## 2023-02-11 DIAGNOSIS — I4891 Unspecified atrial fibrillation: Secondary | ICD-10-CM

## 2023-02-11 DIAGNOSIS — E785 Hyperlipidemia, unspecified: Secondary | ICD-10-CM | POA: Diagnosis not present

## 2023-02-11 DIAGNOSIS — I110 Hypertensive heart disease with heart failure: Secondary | ICD-10-CM | POA: Diagnosis not present

## 2023-02-11 DIAGNOSIS — N179 Acute kidney failure, unspecified: Secondary | ICD-10-CM | POA: Diagnosis not present

## 2023-02-11 DIAGNOSIS — I5021 Acute systolic (congestive) heart failure: Secondary | ICD-10-CM

## 2023-02-11 DIAGNOSIS — I5043 Acute on chronic combined systolic (congestive) and diastolic (congestive) heart failure: Secondary | ICD-10-CM | POA: Diagnosis not present

## 2023-02-11 NOTE — Transitions of Care (Post Inpatient/ED Visit) (Signed)
   02/11/2023  Name: Darin [name] MRN: [id number] DOB: 19-Jan-1932  Today's TOC FU Call Status: Today's TOC FU Call Status:: Unsuccessful Call (3rd Attempt) Unsuccessful Call (3rd Attempt) Date: 02/11/23  Attempted to reach the patient regarding the most recent Inpatient/ED visit.  Follow Up Plan: No further outreach attempts will be made at this time. We have been unable to contact the patient.    Antionette Fairy, RN,BSN,CCM RN Care Manager Transitions of Care  [facility]  Direct Phone: [phone] Toll Free: [phone] Fax: [phone]

## 2023-02-11 NOTE — Progress Notes (Signed)
Established Patient Office Visit  Subjective   Patient ID: Darin [name], male    DOB: 24-Feb-1932  Age: 89 y.o. MRN: [id number]  Chief Complaint  Patient presents with   Hospitalization Follow-up    HPI   Darin [name] is seen for hospital follow up.   He has history of hyperlipidemia, systolic heart failure, atrial fibrillation, hyperlipidemia.  He had had recent hospitalization about a month ago for cardiogenic shock.  Was discharged and doing well until day prior to admission when he developed increased shortness of breath and increasing bilateral extremity edema.  He was seen in the ED tachycardic and tachypneic and requiring high-dose oxygen with sats in the 70s.  Was put on BiPAP.  BNP 716.  Chest x-ray showed pulmonary vascular congestion.  EKG showed A-fib with rapid ventricular response of 116.  He was given IV Lasix and diuresed 5-1/2 L Done well since discharge.  He tries to weigh every day but has some difficulties with balance.  Denies any dyspnea at rest.  Is able to lay flat without shortness of breath.  Was placed on low-dose digoxin 0.125 mg daily and torsemide 20 mg daily along with Farxiga 10 mg daily.  Trying to watch sodium intake.  Has follow-up with cardiology tomorrow.  Patient has declined anticoagulation for his A-fib.  He had's CHA2DS2-VASc score of 5  Recent echo revealed EF 20 to 25% with moderate mitral valve regurg and moderate to severe tricuspid regurge.  He had NSTEMI back in September.  Declined cardiac catheterization.  Hospital discharge reported polymyalgia rheumatica but we cannot find that on his problem list.  Sounds like he is had polyneuropathy but no known history of polymyalgia rheumatica.  He has very limited mobility and only minimally ambulating with walker at home.  Denies any dizziness.  No syncope.  Past Medical History:  Diagnosis Date   Arthritis    Chronic HFrEF (heart failure with reduced ejection fraction) (HCC)    DUPUYTREN'S  CONTRACTURE, RIGHT 07/04/2009   Annotation: 4th digit Qualifier: Diagnosis of  By: Lovell Sheehan MD, Balinda Quails    GERD (gastroesophageal reflux disease)    H/O hiatal hernia    "FOR YEARS" " NO PROBLEM"   Hyperlipidemia    Hypertension    Knee pain    Neuropathy    PERIPHERAL    Osteoporosis    PMR (polymyalgia rheumatica) (HCC)    Polymyalgia (HCC) 5 YEARS   Past Surgical History:  Procedure Laterality Date   CARPAL TUNNEL RELEASE     CATARACT EXTRACTION     right   COLONOSCOPY  2007   INGUINAL HERNIA REPAIR  10/14/2011   Procedure: HERNIA REPAIR INGUINAL ADULT;  Surgeon: Shelly Rubenstein, MD;  Location: [facility] OR;  Service: General;  Laterality: Right;  Right Inguinal Hernia Repair with Mesh   TIBIA IM NAIL INSERTION Right 05/22/2021   Procedure: INTRAMEDULLARY (IM) NAIL TIBIAL;  Surgeon: Myrene Galas, MD;  Location: [facility] OR;  Service: Orthopedics;  Laterality: Right;   TONSILLECTOMY     VARICOSE VEIN SURGERY      reports that he has never smoked. He has never used smokeless tobacco. He reports that he does not currently use alcohol after a past usage of about 3.0 standard drinks of alcohol per week. He reports that he does not use drugs. family history includes Coronary artery disease in an other family member; Heart disease in his father. Allergies  Allergen Reactions   Statins Other (See Comments)    Muscle  pain, upset stomach, and leg pain     He has hypothyroidism.  His levothyroxine was increased to 50 mcg during hospitalization. Review of Systems  Constitutional:  Negative for chills and fever.  Eyes:  Negative for blurred vision.  Respiratory:  Negative for cough and shortness of breath.   Cardiovascular:  Negative for chest pain, orthopnea and leg swelling.  Gastrointestinal:  Negative for abdominal pain.  Genitourinary:  Negative for dysuria.  Neurological:  Negative for dizziness and headaches.      Objective:     BP (!) 82/40 (BP Location: Left Arm, Patient  Position: Sitting, Cuff Size: Normal)   Pulse 67   Temp (!) 97.4 F (36.3 C) (Oral)   Ht 6\' 1"  (1.854 m)   SpO2 99%   BMI 26.38 kg/m  BP Readings from Last 3 Encounters:  02/11/23 (!) 82/40  02/07/23 114/73  01/28/23 (!) 108/50   Wt Readings from Last 3 Encounters:  02/07/23 199 lb 15.3 oz (90.7 kg)  01/25/23 223 lb 8.7 oz (101.4 kg)  12/06/22 197 lb 8.5 oz (89.6 kg)      Physical Exam Vitals reviewed.  Constitutional:      General: He is not in acute distress. Cardiovascular:     Rate and Rhythm: Normal rate and regular rhythm.  Pulmonary:     Effort: Pulmonary effort is normal.     Breath sounds: Normal breath sounds. No wheezing or rales.  Musculoskeletal:     Comments: He has a follow-up compression hose in place.  Does have 1+ pitting edema lower legs and ankles  Neurological:     Mental Status: He is alert.      No results found for any visits on 02/11/23.  Last CBC Lab Results  Component Value Date   WBC 6.3 02/05/2023   HGB 11.1 (L) 02/05/2023   HCT 35.1 (L) 02/05/2023   MCV 95.6 02/05/2023   MCH 30.2 02/05/2023   RDW 14.6 02/05/2023   PLT 246 02/05/2023   Last metabolic panel Lab Results  Component Value Date   GLUCOSE 98 02/07/2023   NA 137 02/07/2023   K 4.9 02/07/2023   CL 102 02/07/2023   CO2 26 02/07/2023   BUN 29 (H) 02/07/2023   CREATININE 1.08 02/07/2023   GFRNONAA >60 02/07/2023   CALCIUM 9.5 02/07/2023   PHOS 5.2 (H) 01/22/2023   PROT 7.2 02/04/2023   ALBUMIN 3.2 (L) 02/04/2023   BILITOT 0.4 02/04/2023   ALKPHOS 103 02/04/2023   AST 22 02/04/2023   ALT 21 02/04/2023   ANIONGAP 9 02/07/2023   Last lipids Lab Results  Component Value Date   CHOL 208 (H) 12/04/2022   HDL 85 12/04/2022   LDLCALC 113 (H) 12/04/2022   LDLDIRECT 148.5 12/12/2009   TRIG 48 12/04/2022   CHOLHDL 2.4 12/04/2022   Last thyroid functions Lab Results  Component Value Date   TSH 46.641 (H) 02/04/2023      The ASCVD Risk score (Arnett DK, et  al., 2019) failed to calculate for the following reasons:   The 2019 ASCVD risk score is only valid for ages 39 to 47    Assessment & Plan:   #1 recent acute exacerbation of chronic systolic heart failure.  Patient stable at this time.  No orthopnea.  Lungs clear.  Recent BNP 716.  O2 sat today 99% room air. Recently discharged on torsemide, Farxiga, digoxin Pending follow-up with cardiology tomorrow Monitor to elevate legs frequently, continue daily weights, watch sodium intake, try  to keep daily fluid intake around 1-1/2 L. Continue high potassium diet  #2 history of atrial fibrillation.  Appears to be in sinus rhythm at this time.  Maintained on metoprolol.  Patient has declined anticoagulation.  We reviewed his CHA2DS2-VASc score today and he is aware of increased risk of stroke with atrial fibrillation without anticoagulation  #3 hypothyroidism.  Currently on levothyroxine 50 mcg daily.  Set up 4-month follow-up and recheck TSH at that time  #4 history of CAD.  NSTEMI September 2024.  Patient declined left heart catheterization.  History of intolerance to statins.  Maintained on Zetia.  #5 chronic kidney disease.-Stable by recent hospital discharge labs.  #6 advanced directives.  We discussed this in some detail today.  He is DNR and very comfortable with that decision.  He has a realistic understanding of his life expectancy and states he is a total piece with that.   His main goal is comfort care.    Evelena Peat, MD

## 2023-02-14 DIAGNOSIS — E039 Hypothyroidism, unspecified: Secondary | ICD-10-CM | POA: Diagnosis not present

## 2023-02-14 DIAGNOSIS — I5043 Acute on chronic combined systolic (congestive) and diastolic (congestive) heart failure: Secondary | ICD-10-CM | POA: Diagnosis not present

## 2023-02-14 DIAGNOSIS — I2722 Pulmonary hypertension due to left heart disease: Secondary | ICD-10-CM | POA: Diagnosis not present

## 2023-02-14 DIAGNOSIS — I110 Hypertensive heart disease with heart failure: Secondary | ICD-10-CM | POA: Diagnosis not present

## 2023-02-14 DIAGNOSIS — I081 Rheumatic disorders of both mitral and tricuspid valves: Secondary | ICD-10-CM | POA: Diagnosis not present

## 2023-02-14 DIAGNOSIS — N179 Acute kidney failure, unspecified: Secondary | ICD-10-CM | POA: Diagnosis not present

## 2023-02-17 ENCOUNTER — Ambulatory Visit: Payer: Medicare Other | Attending: Cardiovascular Disease | Admitting: Cardiovascular Disease

## 2023-02-17 ENCOUNTER — Encounter: Payer: Self-pay | Admitting: Cardiovascular Disease

## 2023-02-17 VITALS — BP 112/50 | HR 67 | Ht 73.0 in | Wt 188.6 lb

## 2023-02-17 DIAGNOSIS — Z5181 Encounter for therapeutic drug level monitoring: Secondary | ICD-10-CM | POA: Diagnosis not present

## 2023-02-17 DIAGNOSIS — Z79899 Other long term (current) drug therapy: Secondary | ICD-10-CM | POA: Diagnosis not present

## 2023-02-17 NOTE — Patient Instructions (Signed)
Medication Instructions:  No changes *If you need a refill on your cardiac medications before your next appointment, please call your pharmacy*   Lab Work: Digoxin level and BMP If you have labs (blood work) drawn today and your tests are completely normal, you will receive your results only by: MyChart Message (if you have MyChart) OR A paper copy in the mail If you have any lab test that is abnormal or we need to change your treatment, we will call you to review the results.   Follow-Up: At [facility], you and your health needs are our priority.  As part of our continuing mission to provide you with exceptional heart care, we have created designated Provider Care Teams.  These Care Teams include your primary Cardiologist (physician) and Advanced Practice Providers (APPs -  Physician Assistants and Nurse Practitioners) who all work together to provide you with the care you need, when you need it.  We recommend signing up for the patient portal called "MyChart".  Sign up information is provided on this After Visit Summary.  MyChart is used to connect with patients for Virtual Visits (Telemedicine).  Patients are able to view lab/test results, encounter notes, upcoming appointments, etc.  Non-urgent messages can be sent to your provider as well.   To learn more about what you can do with MyChart, go to [email or web address].    Your next appointment:   6 month(s)  Provider:   Dr Royann Shivers

## 2023-02-18 DIAGNOSIS — N179 Acute kidney failure, unspecified: Secondary | ICD-10-CM | POA: Diagnosis not present

## 2023-02-18 DIAGNOSIS — I110 Hypertensive heart disease with heart failure: Secondary | ICD-10-CM | POA: Diagnosis not present

## 2023-02-18 DIAGNOSIS — I081 Rheumatic disorders of both mitral and tricuspid valves: Secondary | ICD-10-CM | POA: Diagnosis not present

## 2023-02-18 DIAGNOSIS — E039 Hypothyroidism, unspecified: Secondary | ICD-10-CM | POA: Diagnosis not present

## 2023-02-18 DIAGNOSIS — I2722 Pulmonary hypertension due to left heart disease: Secondary | ICD-10-CM | POA: Diagnosis not present

## 2023-02-18 DIAGNOSIS — I5043 Acute on chronic combined systolic (congestive) and diastolic (congestive) heart failure: Secondary | ICD-10-CM | POA: Diagnosis not present

## 2023-02-18 LAB — BASIC METABOLIC PANEL
BUN/Creatinine Ratio: 25 — ABNORMAL HIGH (ref 10–24)
BUN: 28 mg/dL — ABNORMAL HIGH (ref 8–27)
CO2: 26 mmol/L (ref 20–29)
Calcium: 9.5 mg/dL (ref 8.6–10.2)
Chloride: 98 mmol/L (ref 96–106)
Creatinine, Ser: 1.12 mg/dL (ref 0.76–1.27)
Glucose: 88 mg/dL (ref 70–99)
Potassium: 4.9 mmol/L (ref 3.5–5.2)
Sodium: 137 mmol/L (ref 134–144)
eGFR: 63 mL/min/{1.73_m2} (ref >59)

## 2023-02-18 LAB — DIGOXIN LEVEL: Digoxin, Serum: 0.6 ng/mL (ref 0.5–0.9)

## 2023-02-18 NOTE — Progress Notes (Signed)
Cardiology Office Note:  .   Date:  02/18/2023  ID:  Lorraine Lax, DOB 12-Dec-1931, MRN [id number] PCP: [name], MD  [facility] Providers Cardiologist:  None    History of Present Illness: .   Darin [name] is a 89 y.o. male with a hx of HTN, HLD, chronic HFrEF (LVEF 20-25%), mitral regurgitation, tricuspid regurgitation, secondary pulmonary hypertension, presumed CAD with prior medically managed NSTEMIs, persistent atrial fib/flutter, bradycardia, carotid artery disease (40-59% BICA 04/2021), neuropathy, BPH, GERD, normocytic anemia, polymyalgia rheumatica returns in follow-up after recent hospitalization for heart failure exacerbation.  It is presumed that he has ischemic cardiomyopathy but he does not want to undergo invasive evaluation.  Since returning home his weight has been very stable around 198 pounds on his home scale (note that this is 10 pounds higher than our office scale reports.  He has minimal ankle edema.  He has not had orthopnea or PND.  He gets around his home with a wheelchair and does get short of breath sometimes when he is to cross from 1 end of the house to the other.  He does not have any problems with chest pain, dizziness or syncope.  During his recent hospitalization he started treatment with digoxin and [place].  He has taken a stable dose of torsemide 20 mg twice daily since hospital discharge.    ROS: He has had a few problems with intertrigo rapidly resolved with over-the-counter antifungal ointment.  Studies Reviewed: .        Echocardiogram 12/04/2022    1. Left ventricular ejection fraction, by estimation, is 20 to 25%. The  left ventricle has severely decreased function. The left ventricle  demonstrates regional wall motion abnormalities (see scoring  diagram/findings for description). Left ventricular  diastolic parameters are consistent with Grade I diastolic dysfunction  (impaired relaxation).   2. Right ventricular systolic  function is mildly reduced. The right  ventricular size is normal. There is moderately elevated pulmonary artery  systolic pressure.   3. Left atrial size was mildly dilated.   4. Right atrial size was moderately dilated.   5. The mitral valve is degenerative. Moderate mitral valve regurgitation.   6. Tricuspid valve regurgitation is moderate to severe.   7. The aortic valve is tricuspid. There is moderate calcification of the  aortic valve. There is mild thickening of the aortic valve. Aortic valve  regurgitation is mild. Aortic valve sclerosis/calcification is present,  without any evidence of aortic  stenosis.   8. The inferior vena cava is dilated in size with <50% respiratory  variability, suggesting right atrial pressure of 15 mmHg.   Conclusion(s)/Recommendation(s): Findings consistent with ischemic  cardiomyopathy.  Risk Assessment/Calculations:    CHA2DS2-VASc Score = 5   This indicates a 7.2% annual risk of stroke. The patient's score is based upon: CHF History: 1 HTN History: 1 Diabetes History: 0 Stroke History: 0 Vascular Disease History: 1 Age Score: 2 Gender Score: 0            Physical Exam:   VS:  BP (!) 112/50 (BP Location: Left Arm, Patient Position: Sitting, Cuff Size: Normal)   Pulse 67   Ht 6\' 1"  (1.854 m)   Wt 188 lb 9.6 oz (85.5 kg)   SpO2 98%   BMI 24.88 kg/m    Wt Readings from Last 3 Encounters:  02/17/23 188 lb 9.6 oz (85.5 kg)  02/07/23 199 lb 15.3 oz (90.7 kg)  01/25/23 223 lb 8.7 oz (101.4 kg)  GEN: Well nourished, well developed in no acute distress NECK: No JVD; No carotid bruits CARDIAC: Irregular, very no murmurs, rubs, gallops RESPIRATORY:  Clear to auscultation without rales, wheezing or rhonchi  ABDOMEN: Soft, non-tender, non-distended EXTREMITIES:  No edema; No deformity   ASSESSMENT AND PLAN: .   CHF: Severe depressed LVEF, presumably due to ischemic cardiomyopathy.  His blood pressure has limited aggressive therapy with  RAAS inhibitors or higher doses of beta-blocker (he required midodrine noticed).Marland Kitchen  He is taking [place] and digoxin.  He has been on a stable dose of diuretic.  His weight today in our office is 10 pounds less than it was reported at the hospital, but notes that his home scale weight today is virtually identical with his discharge weight from 02/07/2023 (198-199 pounds).  Will set a "dry weight" upper limit for his weight of 200 pounds.  He should call our office if his weight exceeds this for more than 2 consecutive days.  We discussed the fact that intervening early with higher doses of diuretics would be more effective and hopefully prevent readmission to the hospital. AFib: Longstanding persistent arrhythmia, well rate controlled.  On beta-blockers and digoxin (check digoxin level today).  He does not want to take anticoagulants. CAD: He does not have angina pectoris.  Has had previous non-STEMI, medically managed.  Suspected cause of his cardiomyopathy, but he has never undergone angiography.  On lipid-lowering therapy with ezetimibe since he is intolerant of statins due to myopathy. CKD3A: Recheck his basic metabolic panel today Bilateral carotid stenosis: Does not want to undergo revascularization procedures.  Serial monitoring not indicated. During his hospitalization, we had a discussion regarding his expectations and goals.  Mr. Ubaldo Glassing is very intelligent and educated and has excellent cognitive function.  He makes it clear that he is interested in maintaining some degree of autonomy and quality of life.  He does not want to undergo any invasive procedures or take medications that do not have direct impact on quality of life.  He wants to die at home.        Dispo: Continue daily weight monitoring, call our office if his weight exceeds 200 pounds.  Check labs today.  Sodium restricted diet.  Signed, Thurmon Fair, MD

## 2023-02-24 DIAGNOSIS — I081 Rheumatic disorders of both mitral and tricuspid valves: Secondary | ICD-10-CM | POA: Diagnosis not present

## 2023-02-24 DIAGNOSIS — E039 Hypothyroidism, unspecified: Secondary | ICD-10-CM | POA: Diagnosis not present

## 2023-02-24 DIAGNOSIS — I5043 Acute on chronic combined systolic (congestive) and diastolic (congestive) heart failure: Secondary | ICD-10-CM | POA: Diagnosis not present

## 2023-02-24 DIAGNOSIS — I110 Hypertensive heart disease with heart failure: Secondary | ICD-10-CM | POA: Diagnosis not present

## 2023-02-24 DIAGNOSIS — N179 Acute kidney failure, unspecified: Secondary | ICD-10-CM | POA: Diagnosis not present

## 2023-02-24 DIAGNOSIS — I2722 Pulmonary hypertension due to left heart disease: Secondary | ICD-10-CM | POA: Diagnosis not present

## 2023-02-25 ENCOUNTER — Other Ambulatory Visit: Payer: Self-pay

## 2023-02-25 MED ORDER — EZETIMIBE 10 MG PO TABS: 10.0000 mg | ORAL_TABLET | Freq: Every day | ORAL | 3 refills | Status: AC

## 2023-02-25 MED ORDER — TORSEMIDE 20 MG PO TABS: 20.0000 mg | ORAL_TABLET | Freq: Every day | ORAL | 3 refills | Status: AC

## 2023-02-26 ENCOUNTER — Telehealth: Payer: Self-pay | Admitting: Family Medicine

## 2023-02-26 MED ORDER — FINASTERIDE 5 MG PO TABS: ORAL_TABLET | ORAL | 0 refills | Status: DC

## 2023-02-26 NOTE — Telephone Encounter (Signed)
Prescription Request  02/26/2023  LOV: 02/11/2023 What is the name of the medication or equipment? finasteride (PROSCAR) 5 MG tablet   Have you contacted your pharmacy to request a refill? No   Which pharmacy would you like this sent to?  [facility] [place], [place] - [address] [facility] [address] [place] [address] Phone: [phone] Fax: [phone]     Patient notified that their request is being sent to the clinical staff for review and that they should receive a response within 2 business days.   Please advise at Mobile [phone] (mobile)

## 2023-02-26 NOTE — Telephone Encounter (Signed)
Rx sent 

## 2023-02-27 DIAGNOSIS — E039 Hypothyroidism, unspecified: Secondary | ICD-10-CM | POA: Diagnosis not present

## 2023-02-27 DIAGNOSIS — I110 Hypertensive heart disease with heart failure: Secondary | ICD-10-CM | POA: Diagnosis not present

## 2023-02-27 DIAGNOSIS — I2722 Pulmonary hypertension due to left heart disease: Secondary | ICD-10-CM | POA: Diagnosis not present

## 2023-02-27 DIAGNOSIS — I5043 Acute on chronic combined systolic (congestive) and diastolic (congestive) heart failure: Secondary | ICD-10-CM | POA: Diagnosis not present

## 2023-02-27 DIAGNOSIS — N179 Acute kidney failure, unspecified: Secondary | ICD-10-CM | POA: Diagnosis not present

## 2023-02-27 DIAGNOSIS — I081 Rheumatic disorders of both mitral and tricuspid valves: Secondary | ICD-10-CM | POA: Diagnosis not present

## 2023-02-28 ENCOUNTER — Telehealth: Payer: Self-pay | Admitting: Cardiovascular Disease

## 2023-02-28 DIAGNOSIS — M5117 Intervertebral disc disorders with radiculopathy, lumbosacral region: Secondary | ICD-10-CM | POA: Diagnosis not present

## 2023-02-28 DIAGNOSIS — K4020 Bilateral inguinal hernia, without obstruction or gangrene, not specified as recurrent: Secondary | ICD-10-CM | POA: Diagnosis not present

## 2023-02-28 DIAGNOSIS — G63 Polyneuropathy in diseases classified elsewhere: Secondary | ICD-10-CM | POA: Diagnosis not present

## 2023-02-28 DIAGNOSIS — I081 Rheumatic disorders of both mitral and tricuspid valves: Secondary | ICD-10-CM | POA: Diagnosis not present

## 2023-02-28 DIAGNOSIS — I4819 Other persistent atrial fibrillation: Secondary | ICD-10-CM | POA: Diagnosis not present

## 2023-02-28 DIAGNOSIS — M353 Polymyalgia rheumatica: Secondary | ICD-10-CM | POA: Diagnosis not present

## 2023-02-28 DIAGNOSIS — M19049 Primary osteoarthritis, unspecified hand: Secondary | ICD-10-CM | POA: Diagnosis not present

## 2023-02-28 DIAGNOSIS — I5043 Acute on chronic combined systolic (congestive) and diastolic (congestive) heart failure: Secondary | ICD-10-CM | POA: Diagnosis not present

## 2023-02-28 DIAGNOSIS — E039 Hypothyroidism, unspecified: Secondary | ICD-10-CM | POA: Diagnosis not present

## 2023-02-28 DIAGNOSIS — I2722 Pulmonary hypertension due to left heart disease: Secondary | ICD-10-CM | POA: Diagnosis not present

## 2023-02-28 DIAGNOSIS — I4892 Unspecified atrial flutter: Secondary | ICD-10-CM | POA: Diagnosis not present

## 2023-02-28 DIAGNOSIS — M7062 Trochanteric bursitis, left hip: Secondary | ICD-10-CM | POA: Diagnosis not present

## 2023-02-28 DIAGNOSIS — I700 Atherosclerosis of aorta: Secondary | ICD-10-CM | POA: Diagnosis not present

## 2023-02-28 DIAGNOSIS — I951 Orthostatic hypotension: Secondary | ICD-10-CM | POA: Diagnosis not present

## 2023-02-28 DIAGNOSIS — I2510 Atherosclerotic heart disease of native coronary artery without angina pectoris: Secondary | ICD-10-CM | POA: Diagnosis not present

## 2023-02-28 DIAGNOSIS — I252 Old myocardial infarction: Secondary | ICD-10-CM | POA: Diagnosis not present

## 2023-02-28 DIAGNOSIS — K5730 Diverticulosis of large intestine without perforation or abscess without bleeding: Secondary | ICD-10-CM | POA: Diagnosis not present

## 2023-02-28 DIAGNOSIS — D631 Anemia in chronic kidney disease: Secondary | ICD-10-CM | POA: Diagnosis not present

## 2023-02-28 DIAGNOSIS — N1832 Chronic kidney disease, stage 3b: Secondary | ICD-10-CM | POA: Diagnosis not present

## 2023-02-28 DIAGNOSIS — K219 Gastro-esophageal reflux disease without esophagitis: Secondary | ICD-10-CM | POA: Diagnosis not present

## 2023-02-28 DIAGNOSIS — K449 Diaphragmatic hernia without obstruction or gangrene: Secondary | ICD-10-CM | POA: Diagnosis not present

## 2023-02-28 DIAGNOSIS — N179 Acute kidney failure, unspecified: Secondary | ICD-10-CM | POA: Diagnosis not present

## 2023-02-28 DIAGNOSIS — M65342 Trigger finger, left ring finger: Secondary | ICD-10-CM | POA: Diagnosis not present

## 2023-02-28 DIAGNOSIS — I130 Hypertensive heart and chronic kidney disease with heart failure and stage 1 through stage 4 chronic kidney disease, or unspecified chronic kidney disease: Secondary | ICD-10-CM | POA: Diagnosis not present

## 2023-02-28 DIAGNOSIS — M810 Age-related osteoporosis without current pathological fracture: Secondary | ICD-10-CM | POA: Diagnosis not present

## 2023-02-28 MED ORDER — DAPAGLIFLOZIN PROPANEDIOL 10 MG PO TABS: 10.0000 mg | ORAL_TABLET | Freq: Every day | ORAL | 3 refills | Status: AC

## 2023-02-28 NOTE — Telephone Encounter (Signed)
*  STAT* If patient is at the pharmacy, call can be transferred to refill team.   1. Which medications need to be refilled? (please list name of each medication and dose if known)  dapagliflozin propanediol (FARXIGA) 10 MG TABS tablet  2. Which pharmacy/location (including street and city if local pharmacy) is medication to be sent to? [facility] [place], [place] - [address] [place] Phone: [phone]  Fax: [phone]     3. Do they need a 30 day or 90 day supply? 90

## 2023-02-28 NOTE — Telephone Encounter (Signed)
Pt asked that it be sent to [facility] instead because he is not mobile   [facility] - [place], [place] - [address] [facility] to [facility] Phone: [phone]  Fax: [phone]

## 2023-02-28 NOTE — Telephone Encounter (Signed)
 RX sent to requested Pharmacy

## 2023-03-03 ENCOUNTER — Ambulatory Visit: Payer: Medicare Other | Admitting: Podiatry

## 2023-03-03 ENCOUNTER — Other Ambulatory Visit: Payer: Self-pay | Admitting: Cardiovascular Disease

## 2023-03-03 DIAGNOSIS — N1832 Chronic kidney disease, stage 3b: Secondary | ICD-10-CM | POA: Diagnosis not present

## 2023-03-03 DIAGNOSIS — D631 Anemia in chronic kidney disease: Secondary | ICD-10-CM | POA: Diagnosis not present

## 2023-03-03 DIAGNOSIS — I2722 Pulmonary hypertension due to left heart disease: Secondary | ICD-10-CM | POA: Diagnosis not present

## 2023-03-03 DIAGNOSIS — I130 Hypertensive heart and chronic kidney disease with heart failure and stage 1 through stage 4 chronic kidney disease, or unspecified chronic kidney disease: Secondary | ICD-10-CM | POA: Diagnosis not present

## 2023-03-03 DIAGNOSIS — I081 Rheumatic disorders of both mitral and tricuspid valves: Secondary | ICD-10-CM | POA: Diagnosis not present

## 2023-03-03 DIAGNOSIS — I5043 Acute on chronic combined systolic (congestive) and diastolic (congestive) heart failure: Secondary | ICD-10-CM | POA: Diagnosis not present

## 2023-03-06 DIAGNOSIS — I2722 Pulmonary hypertension due to left heart disease: Secondary | ICD-10-CM | POA: Diagnosis not present

## 2023-03-06 DIAGNOSIS — N1832 Chronic kidney disease, stage 3b: Secondary | ICD-10-CM | POA: Diagnosis not present

## 2023-03-06 DIAGNOSIS — I081 Rheumatic disorders of both mitral and tricuspid valves: Secondary | ICD-10-CM | POA: Diagnosis not present

## 2023-03-06 DIAGNOSIS — I130 Hypertensive heart and chronic kidney disease with heart failure and stage 1 through stage 4 chronic kidney disease, or unspecified chronic kidney disease: Secondary | ICD-10-CM | POA: Diagnosis not present

## 2023-03-06 DIAGNOSIS — D631 Anemia in chronic kidney disease: Secondary | ICD-10-CM | POA: Diagnosis not present

## 2023-03-06 DIAGNOSIS — I5043 Acute on chronic combined systolic (congestive) and diastolic (congestive) heart failure: Secondary | ICD-10-CM | POA: Diagnosis not present

## 2023-03-07 ENCOUNTER — Encounter: Payer: Self-pay | Admitting: Podiatry

## 2023-03-07 ENCOUNTER — Ambulatory Visit (INDEPENDENT_AMBULATORY_CARE_PROVIDER_SITE_OTHER): Payer: Medicare Other | Admitting: Podiatry

## 2023-03-07 DIAGNOSIS — M79675 Pain in left toe(s): Secondary | ICD-10-CM

## 2023-03-07 DIAGNOSIS — B351 Tinea unguium: Secondary | ICD-10-CM | POA: Diagnosis not present

## 2023-03-07 DIAGNOSIS — G609 Hereditary and idiopathic neuropathy, unspecified: Secondary | ICD-10-CM

## 2023-03-07 DIAGNOSIS — M79674 Pain in right toe(s): Secondary | ICD-10-CM

## 2023-03-07 NOTE — Progress Notes (Signed)
This patient returns to my office for at risk foot care.  This patient requires this care by a professional since this patient will be at risk due to having idiopathic neuropathy.  This patient is unable to cut nails himself since the patient cannot reach his nails.These nails are painful walking and wearing shoes.   Patient presents to the office in a wheelchair for nail treatment.  This patient presents for at risk foot care today.  General Appearance  Alert, conversant and in no acute stress.  Vascular  Dorsalis pedis and posterior tibial  pulses are weak/absent   palpable  bilaterally.  Capillary return is within normal limits  Bilaterally.Cold feet.  bilaterally.  Neurologic  Senn-Weinstein monofilament wire test within normal limits  bilaterally. Muscle power within normal limits bilaterally.  Nails Thick disfigured discolored nails with subungual debris  from hallux to fifth toes bilaterally. Pincer nails  B/L. No evidence of bacterial infection or drainage bilaterally.  Orthopedic  No limitations of motion  feet .  No crepitus or effusions noted.  No bony pathology or digital deformities noted.  Skin  normotropic skin with no porokeratosis noted bilaterally.  No signs of infections or ulcers noted.    Onychomycosis  Pain in right toes  Pain in left toes    Consent was obtained for treatment procedures.   Mechanical debridement of nails 1-5  bilaterally performed with a nail nipper.  Filed with dremel without incident.    Return office visit   3 months                  Told patient to return for periodic foot care and evaluation due to potential at risk complications.   Helane Gunther DPM

## 2023-03-10 DIAGNOSIS — N1832 Chronic kidney disease, stage 3b: Secondary | ICD-10-CM | POA: Diagnosis not present

## 2023-03-10 DIAGNOSIS — D631 Anemia in chronic kidney disease: Secondary | ICD-10-CM | POA: Diagnosis not present

## 2023-03-10 DIAGNOSIS — I081 Rheumatic disorders of both mitral and tricuspid valves: Secondary | ICD-10-CM | POA: Diagnosis not present

## 2023-03-10 DIAGNOSIS — I130 Hypertensive heart and chronic kidney disease with heart failure and stage 1 through stage 4 chronic kidney disease, or unspecified chronic kidney disease: Secondary | ICD-10-CM | POA: Diagnosis not present

## 2023-03-10 DIAGNOSIS — I2722 Pulmonary hypertension due to left heart disease: Secondary | ICD-10-CM | POA: Diagnosis not present

## 2023-03-10 DIAGNOSIS — I5043 Acute on chronic combined systolic (congestive) and diastolic (congestive) heart failure: Secondary | ICD-10-CM | POA: Diagnosis not present

## 2023-03-11 ENCOUNTER — Ambulatory Visit: Payer: Medicare Other | Admitting: Family Medicine

## 2023-03-14 DIAGNOSIS — I2722 Pulmonary hypertension due to left heart disease: Secondary | ICD-10-CM | POA: Diagnosis not present

## 2023-03-14 DIAGNOSIS — I081 Rheumatic disorders of both mitral and tricuspid valves: Secondary | ICD-10-CM | POA: Diagnosis not present

## 2023-03-14 DIAGNOSIS — I5043 Acute on chronic combined systolic (congestive) and diastolic (congestive) heart failure: Secondary | ICD-10-CM | POA: Diagnosis not present

## 2023-03-14 DIAGNOSIS — I130 Hypertensive heart and chronic kidney disease with heart failure and stage 1 through stage 4 chronic kidney disease, or unspecified chronic kidney disease: Secondary | ICD-10-CM | POA: Diagnosis not present

## 2023-03-14 DIAGNOSIS — N1832 Chronic kidney disease, stage 3b: Secondary | ICD-10-CM | POA: Diagnosis not present

## 2023-03-14 DIAGNOSIS — D631 Anemia in chronic kidney disease: Secondary | ICD-10-CM | POA: Diagnosis not present

## 2023-03-17 DIAGNOSIS — D631 Anemia in chronic kidney disease: Secondary | ICD-10-CM | POA: Diagnosis not present

## 2023-03-17 DIAGNOSIS — I130 Hypertensive heart and chronic kidney disease with heart failure and stage 1 through stage 4 chronic kidney disease, or unspecified chronic kidney disease: Secondary | ICD-10-CM | POA: Diagnosis not present

## 2023-03-17 DIAGNOSIS — I2722 Pulmonary hypertension due to left heart disease: Secondary | ICD-10-CM | POA: Diagnosis not present

## 2023-03-17 DIAGNOSIS — I081 Rheumatic disorders of both mitral and tricuspid valves: Secondary | ICD-10-CM | POA: Diagnosis not present

## 2023-03-17 DIAGNOSIS — I5043 Acute on chronic combined systolic (congestive) and diastolic (congestive) heart failure: Secondary | ICD-10-CM | POA: Diagnosis not present

## 2023-03-17 DIAGNOSIS — N1832 Chronic kidney disease, stage 3b: Secondary | ICD-10-CM | POA: Diagnosis not present

## 2023-03-21 DIAGNOSIS — I8312 Varicose veins of left lower extremity with inflammation: Secondary | ICD-10-CM | POA: Diagnosis not present

## 2023-03-21 DIAGNOSIS — L281 Prurigo nodularis: Secondary | ICD-10-CM | POA: Diagnosis not present

## 2023-03-21 DIAGNOSIS — Z85828 Personal history of other malignant neoplasm of skin: Secondary | ICD-10-CM | POA: Diagnosis not present

## 2023-03-21 DIAGNOSIS — D225 Melanocytic nevi of trunk: Secondary | ICD-10-CM | POA: Diagnosis not present

## 2023-03-21 DIAGNOSIS — L308 Other specified dermatitis: Secondary | ICD-10-CM | POA: Diagnosis not present

## 2023-03-21 DIAGNOSIS — I8311 Varicose veins of right lower extremity with inflammation: Secondary | ICD-10-CM | POA: Diagnosis not present

## 2023-03-21 DIAGNOSIS — D2261 Melanocytic nevi of right upper limb, including shoulder: Secondary | ICD-10-CM | POA: Diagnosis not present

## 2023-03-21 DIAGNOSIS — I872 Venous insufficiency (chronic) (peripheral): Secondary | ICD-10-CM | POA: Diagnosis not present

## 2023-03-21 DIAGNOSIS — L570 Actinic keratosis: Secondary | ICD-10-CM | POA: Diagnosis not present

## 2023-03-27 DIAGNOSIS — I081 Rheumatic disorders of both mitral and tricuspid valves: Secondary | ICD-10-CM | POA: Diagnosis not present

## 2023-03-27 DIAGNOSIS — D631 Anemia in chronic kidney disease: Secondary | ICD-10-CM | POA: Diagnosis not present

## 2023-03-27 DIAGNOSIS — I5043 Acute on chronic combined systolic (congestive) and diastolic (congestive) heart failure: Secondary | ICD-10-CM | POA: Diagnosis not present

## 2023-03-27 DIAGNOSIS — I2722 Pulmonary hypertension due to left heart disease: Secondary | ICD-10-CM | POA: Diagnosis not present

## 2023-03-27 DIAGNOSIS — I130 Hypertensive heart and chronic kidney disease with heart failure and stage 1 through stage 4 chronic kidney disease, or unspecified chronic kidney disease: Secondary | ICD-10-CM | POA: Diagnosis not present

## 2023-03-27 DIAGNOSIS — N1832 Chronic kidney disease, stage 3b: Secondary | ICD-10-CM | POA: Diagnosis not present

## 2023-03-30 DIAGNOSIS — M65331 Trigger finger, right middle finger: Secondary | ICD-10-CM | POA: Diagnosis not present

## 2023-03-30 DIAGNOSIS — K449 Diaphragmatic hernia without obstruction or gangrene: Secondary | ICD-10-CM | POA: Diagnosis not present

## 2023-03-30 DIAGNOSIS — M353 Polymyalgia rheumatica: Secondary | ICD-10-CM | POA: Diagnosis not present

## 2023-03-30 DIAGNOSIS — E039 Hypothyroidism, unspecified: Secondary | ICD-10-CM | POA: Diagnosis not present

## 2023-03-30 DIAGNOSIS — I700 Atherosclerosis of aorta: Secondary | ICD-10-CM | POA: Diagnosis not present

## 2023-03-30 DIAGNOSIS — I4819 Other persistent atrial fibrillation: Secondary | ICD-10-CM | POA: Diagnosis not present

## 2023-03-30 DIAGNOSIS — I081 Rheumatic disorders of both mitral and tricuspid valves: Secondary | ICD-10-CM | POA: Diagnosis not present

## 2023-03-30 DIAGNOSIS — I252 Old myocardial infarction: Secondary | ICD-10-CM | POA: Diagnosis not present

## 2023-03-30 DIAGNOSIS — I951 Orthostatic hypotension: Secondary | ICD-10-CM | POA: Diagnosis not present

## 2023-03-30 DIAGNOSIS — I2510 Atherosclerotic heart disease of native coronary artery without angina pectoris: Secondary | ICD-10-CM | POA: Diagnosis not present

## 2023-03-30 DIAGNOSIS — M19049 Primary osteoarthritis, unspecified hand: Secondary | ICD-10-CM | POA: Diagnosis not present

## 2023-03-30 DIAGNOSIS — D631 Anemia in chronic kidney disease: Secondary | ICD-10-CM | POA: Diagnosis not present

## 2023-03-30 DIAGNOSIS — I5043 Acute on chronic combined systolic (congestive) and diastolic (congestive) heart failure: Secondary | ICD-10-CM | POA: Diagnosis not present

## 2023-03-30 DIAGNOSIS — I4892 Unspecified atrial flutter: Secondary | ICD-10-CM | POA: Diagnosis not present

## 2023-03-30 DIAGNOSIS — M5117 Intervertebral disc disorders with radiculopathy, lumbosacral region: Secondary | ICD-10-CM | POA: Diagnosis not present

## 2023-03-30 DIAGNOSIS — M810 Age-related osteoporosis without current pathological fracture: Secondary | ICD-10-CM | POA: Diagnosis not present

## 2023-03-30 DIAGNOSIS — I2722 Pulmonary hypertension due to left heart disease: Secondary | ICD-10-CM | POA: Diagnosis not present

## 2023-03-30 DIAGNOSIS — K219 Gastro-esophageal reflux disease without esophagitis: Secondary | ICD-10-CM | POA: Diagnosis not present

## 2023-03-30 DIAGNOSIS — I130 Hypertensive heart and chronic kidney disease with heart failure and stage 1 through stage 4 chronic kidney disease, or unspecified chronic kidney disease: Secondary | ICD-10-CM | POA: Diagnosis not present

## 2023-03-30 DIAGNOSIS — K5730 Diverticulosis of large intestine without perforation or abscess without bleeding: Secondary | ICD-10-CM | POA: Diagnosis not present

## 2023-03-30 DIAGNOSIS — M65342 Trigger finger, left ring finger: Secondary | ICD-10-CM | POA: Diagnosis not present

## 2023-03-30 DIAGNOSIS — K4020 Bilateral inguinal hernia, without obstruction or gangrene, not specified as recurrent: Secondary | ICD-10-CM | POA: Diagnosis not present

## 2023-03-30 DIAGNOSIS — N1832 Chronic kidney disease, stage 3b: Secondary | ICD-10-CM | POA: Diagnosis not present

## 2023-03-30 DIAGNOSIS — G63 Polyneuropathy in diseases classified elsewhere: Secondary | ICD-10-CM | POA: Diagnosis not present

## 2023-03-30 DIAGNOSIS — M7062 Trochanteric bursitis, left hip: Secondary | ICD-10-CM | POA: Diagnosis not present

## 2023-03-31 DIAGNOSIS — I130 Hypertensive heart and chronic kidney disease with heart failure and stage 1 through stage 4 chronic kidney disease, or unspecified chronic kidney disease: Secondary | ICD-10-CM | POA: Diagnosis not present

## 2023-03-31 DIAGNOSIS — I5043 Acute on chronic combined systolic (congestive) and diastolic (congestive) heart failure: Secondary | ICD-10-CM | POA: Diagnosis not present

## 2023-03-31 DIAGNOSIS — I081 Rheumatic disorders of both mitral and tricuspid valves: Secondary | ICD-10-CM | POA: Diagnosis not present

## 2023-03-31 DIAGNOSIS — I2722 Pulmonary hypertension due to left heart disease: Secondary | ICD-10-CM | POA: Diagnosis not present

## 2023-03-31 DIAGNOSIS — N1832 Chronic kidney disease, stage 3b: Secondary | ICD-10-CM | POA: Diagnosis not present

## 2023-03-31 DIAGNOSIS — D631 Anemia in chronic kidney disease: Secondary | ICD-10-CM | POA: Diagnosis not present

## 2023-04-01 NOTE — Progress Notes (Deleted)
 [name] [address] [place] [address] Phone: [phone] Subjective:    I'm seeing this patient by the request  of:  Micheal Wolm ORN, MD  CC: Bilateral hand pain  [id number]  12/12/2022 Patient actually seems to be doing better actually when we do see him.  Left ring finger as well.  Discussed icing regimen and home exercises.  Increase activity slowly.  Follow-up with me 6 weeks just because patient does seem to be somewhat deconditioned after patient's hospital stay.  Patient declined any home health nursing or home health physical therapy.  We did discuss with him if the fatigue seems to worsen to seek medical attention immediately.      Update 04/02/2023 Darin [name] is a 89 y.o. male coming in with complaint of B hand pain.  Have seen patient previously for multiple trigger fingers.  Patient states    Since we have seen patient has had 2 hospitalizations for acute renal failure as well as respiratory distress.  Continuing to have difficulty with congestive heart failure.  Past Medical History:  Diagnosis Date   Arthritis    Chronic HFrEF (heart failure with reduced ejection fraction) (HCC)    DUPUYTREN'S CONTRACTURE, RIGHT 07/04/2009   Annotation: 4th digit Qualifier: Diagnosis of  By: Mavis MD, Norleen BRAVO    GERD (gastroesophageal reflux disease)    H/O hiatal hernia    FOR YEARS  NO PROBLEM   Hyperlipidemia    Hypertension    Knee pain    Neuropathy    PERIPHERAL    Osteoporosis    PMR (polymyalgia rheumatica) (HCC)    Polymyalgia (HCC) 5 YEARS   Past Surgical History:  Procedure Laterality Date   CARPAL TUNNEL RELEASE     CATARACT EXTRACTION     right   COLONOSCOPY  2007   INGUINAL HERNIA REPAIR  10/14/2011   Procedure: HERNIA REPAIR INGUINAL ADULT;  Surgeon: Vicenta DELENA Poli, MD;  Location: [facility] OR;  Service: General;  Laterality: Right;  Right Inguinal Hernia Repair with Mesh   TIBIA IM NAIL INSERTION Right  05/22/2021   Procedure: INTRAMEDULLARY (IM) NAIL TIBIAL;  Surgeon: Celena Sharper, MD;  Location: [facility] OR;  Service: Orthopedics;  Laterality: Right;   TONSILLECTOMY     VARICOSE VEIN SURGERY     Social History   Socioeconomic History   Marital status: Widowed    Spouse name: Not on file   Number of children: Not on file   Years of education: Not on file   Highest education level: Master's degree (e.g., MA, MS, MEng, MEd, MSW, MBA)  Occupational History   Occupation: retired    Associate Professor: RETIRED  Tobacco Use   Smoking status: Never   Smokeless tobacco: Never  Vaping Use   Vaping status: Never Used  Substance and Sexual Activity   Alcohol use: Not Currently    Alcohol/week: 3.0 standard drinks of alcohol    Types: 3 Glasses of wine per week    Comment: half glass wine before dinner   Drug use: No   Sexual activity: Not Currently  Other Topics Concern   Not on file  Social History Narrative   Not on file   Social Drivers of Health   Financial Resource Strain: Low Risk  (08/30/2022)   Overall Financial Resource Strain (CARDIA)    Difficulty of Paying Living Expenses: Not hard at all  Food Insecurity: No Food Insecurity (02/04/2023)   Hunger Vital Sign    Worried About  Running Out of Food in the Last Year: Never true    Ran Out of Food in the Last Year: Never true  Transportation Needs: No Transportation Needs (02/04/2023)   PRAPARE - Administrator, Civil Service (Medical): No    Lack of Transportation (Non-Medical): No  Physical Activity: Sufficiently Active (08/30/2022)   Exercise Vital Sign    Days of Exercise per Week: 7 days    Minutes of Exercise per Session: 40 min  Stress: No Stress Concern Present (08/30/2022)   [employer] of Occupational Health - Occupational Stress Questionnaire    Feeling of Stress : Not at all  Social Connections: Moderately Integrated (08/30/2022)   Social Connection and Isolation Panel [NHANES]    Frequency of Communication  with Friends and Family: More than three times a week    Frequency of Social Gatherings with Friends and Family: More than three times a week    Attends Religious Services: More than 4 times per year    Active Member of [employer] or Organizations: Yes    Attends Banker Meetings: More than 4 times per year    Marital Status: Widowed   Allergies  Allergen Reactions   Statins Other (See Comments)    Muscle pain, upset stomach, and leg pain   Family History  Problem Relation Age of Onset   Heart disease Father    Coronary artery disease Other     Current Outpatient Medications (Endocrine & Metabolic):    dapagliflozin  propanediol (FARXIGA ) 10 MG TABS tablet, Take 1 tablet (10 mg total) by mouth daily.   levothyroxine  (SYNTHROID ) 50 MCG tablet, Take 1 tablet (50 mcg total) by mouth daily before breakfast.  Current Outpatient Medications (Cardiovascular):    digoxin  (LANOXIN ) 0.125 MG tablet, Take 1 tablet (0.125 mg total) by mouth daily.   ezetimibe  (ZETIA ) 10 MG tablet, Take 1 tablet (10 mg total) by mouth daily.   metoprolol  succinate (TOPROL -XL) 25 MG 24 hr tablet, Take 1 tablet (25 mg total) by mouth daily.   torsemide  (DEMADEX ) 20 MG tablet, Take 1 tablet (20 mg total) by mouth daily.    Current Outpatient Medications (Hematological):    clopidogrel  (PLAVIX ) 75 MG tablet, Take 1 tablet (75 mg total) by mouth daily.  Current Outpatient Medications (Other):    betamethasone dipropionate 0.05 % cream, Apply 1 application  topically as needed (rash).   famotidine  (PEPCID ) 20 MG tablet, TAKE 1 TABLET AT BEDTIME   finasteride  (PROSCAR ) 5 MG tablet, TAKE ONE TABLET BY MOUTH ONCE DAILY.   gabapentin  (NEURONTIN ) 100 MG capsule, TAKE 1 CAPSULE 4 TIMES     DAILY   magnesium  oxide (MAG-OX) 400 (240 Mg) MG tablet, Take 400 mg by mouth at bedtime.   polyethylene glycol (MIRALAX  / GLYCOLAX ) packet, Take 17 g by mouth daily as needed for moderate constipation. (Patient taking  differently: Take 17 g by mouth 2 (two) times daily.)   Reviewed prior external information including notes and imaging from  primary care provider As well as notes that were available from care everywhere and other healthcare systems.  Past medical history, social, surgical and family history all reviewed in electronic medical record.  No pertanent information unless stated regarding to the chief complaint.   Review of Systems:  No headache, visual changes, nausea, vomiting, diarrhea, constipation, dizziness, abdominal pain, skin rash, fevers, chills, night sweats, weight loss, swollen lymph nodes, body aches, joint swelling, chest pain, shortness of breath, mood changes. POSITIVE muscle aches  Objective  There were no vitals taken for this visit.   General: No apparent distress alert and oriented x3 mood and affect normal, dressed appropriately.  HEENT: Pupils equal, extraocular movements intact  Respiratory: Patient's speak in full sentences and does not appear short of breath  Cardiovascular: No lower extremity edema, non tender, no erythema  Shuffling gait with a walker noted.  Hand exam shows  Procedure: Real-time Ultrasound Guided Injection of  Device: GE Logiq Q7 Ultrasound guided injection is preferred based studies that show increased duration, increased effect, greater accuracy, decreased procedural pain, increased response rate, and decreased cost with ultrasound guided versus blind injection.  Verbal informed consent obtained.  Time-out conducted.  Noted no overlying erythema, induration, or other signs of local infection.  Skin prepped in a sterile fashion.  Local anesthesia: Topical Ethyl chloride.  With sterile technique and under real time ultrasound guidance:   Completed without difficulty  Pain immediately resolved suggesting accurate placement of the medication.  Advised to call if fevers/chills, erythema, induration, drainage, or persistent bleeding.   Impression: Technically successful ultrasound guided injection.    Impression and Recommendations:      The above documentation has been reviewed and is accurate and complete Aarushi Hemric M Pam Vanalstine, DO

## 2023-04-02 ENCOUNTER — Ambulatory Visit: Payer: Medicare Other | Admitting: Family Medicine

## 2023-04-08 NOTE — Progress Notes (Signed)
 [facility] [address] [place] [address] Phone: [phone] Subjective:   Darin [name], am serving as a scribe for Dr. Ronnell Coins.  I'm seeing this patient by the request  of:  [name], MD  CC: Hand pain  [id number]  12/12/2022 Patient actually seems to be doing better actually when we do see him.  Left ring finger as well.  Discussed icing regimen and home exercises.  Increase activity slowly.  Follow-up with me 6 weeks just because patient does seem to be somewhat deconditioned after patient's hospital stay.  Patient declined any home health nursing or home health physical therapy.  We did discuss with him if the fatigue seems to worsen to seek medical attention immediately.   Update 04/09/2023 Darin [name] is a 89 y.o. male coming in with complaint of B hand pain. Patient states that he is having trigger finger in 2nd, 3rd, 4th fingers on L hand and middle finger on R hand for past 2 weeks.   Patient presents in wheelchair today. Only using walker around the house.      Past Medical History:  Diagnosis Date   Arthritis    Chronic HFrEF (heart failure with reduced ejection fraction) (HCC)    DUPUYTREN'S CONTRACTURE, RIGHT 07/04/2009   Annotation: 4th digit Qualifier: Diagnosis of  By: Larrie Po MD, Wilmon Hashimoto    GERD (gastroesophageal reflux disease)    H/O hiatal hernia    "FOR YEARS" " NO PROBLEM"   Hyperlipidemia    Hypertension    Knee pain    Neuropathy    PERIPHERAL    Osteoporosis    PMR (polymyalgia rheumatica) (HCC)    Polymyalgia (HCC) 5 YEARS   Past Surgical History:  Procedure Laterality Date   CARPAL TUNNEL RELEASE     CATARACT EXTRACTION     right   COLONOSCOPY  2007   INGUINAL HERNIA REPAIR  10/14/2011   Procedure: HERNIA REPAIR INGUINAL ADULT;  Surgeon: Rogena Class, MD;  Location: [facility] OR;  Service: General;  Laterality: Right;  Right Inguinal Hernia Repair with Mesh   TIBIA IM NAIL INSERTION  Right 05/22/2021   Procedure: INTRAMEDULLARY (IM) NAIL TIBIAL;  Surgeon: Hardy Lia, MD;  Location: [facility] OR;  Service: Orthopedics;  Laterality: Right;   TONSILLECTOMY     VARICOSE VEIN SURGERY     Social History   Socioeconomic History   Marital status: Widowed    Spouse name: Not on file   Number of children: Not on file   Years of education: Not on file   Highest education level: Master's degree (e.g., MA, MS, MEng, MEd, MSW, MBA)  Occupational History   Occupation: retired    Associate Professor: RETIRED  Tobacco Use   Smoking status: Never   Smokeless tobacco: Never  Vaping Use   Vaping status: Never Used  Substance and Sexual Activity   Alcohol use: Not Currently    Alcohol/week: 3.0 standard drinks of alcohol    Types: 3 Glasses of wine per week    Comment: half glass wine before dinner   Drug use: No   Sexual activity: Not Currently  Other Topics Concern   Not on file  Social History Narrative   Not on file   Social Drivers of Health   Financial Resource Strain: Low Risk  (08/30/2022)   Overall Financial Resource Strain (CARDIA)    Difficulty of Paying Living Expenses: Not hard at all  Food Insecurity: No Food Insecurity (02/04/2023)  Hunger Vital Sign    Worried About Running Out of Food in the Last Year: Never true    Ran Out of Food in the Last Year: Never true  Transportation Needs: No Transportation Needs (02/04/2023)   PRAPARE - Administrator, Civil Service (Medical): No    Lack of Transportation (Non-Medical): No  Physical Activity: Sufficiently Active (08/30/2022)   Exercise Vital Sign    Days of Exercise per Week: 7 days    Minutes of Exercise per Session: 40 min  Stress: No Stress Concern Present (08/30/2022)   [employer] of Occupational Health - Occupational Stress Questionnaire    Feeling of Stress : Not at all  Social Connections: Moderately Integrated (08/30/2022)   Social Connection and Isolation Panel [NHANES]    Frequency of  Communication with Friends and Family: More than three times a week    Frequency of Social Gatherings with Friends and Family: More than three times a week    Attends Religious Services: More than 4 times per year    Active Member of [employer] or Organizations: Yes    Attends Banker Meetings: More than 4 times per year    Marital Status: Widowed   Allergies  Allergen Reactions   Statins Other (See Comments)    Muscle pain, upset stomach, and leg pain   Family History  Problem Relation Age of Onset   Heart disease Father    Coronary artery disease Other     Current Outpatient Medications (Endocrine & Metabolic):    dapagliflozin  propanediol (FARXIGA ) 10 MG TABS tablet, Take 1 tablet (10 mg total) by mouth daily.   levothyroxine  (SYNTHROID ) 50 MCG tablet, Take 1 tablet (50 mcg total) by mouth daily before breakfast.  Current Outpatient Medications (Cardiovascular):    digoxin  (LANOXIN ) 0.125 MG tablet, Take 1 tablet (0.125 mg total) by mouth daily.   ezetimibe  (ZETIA ) 10 MG tablet, Take 1 tablet (10 mg total) by mouth daily.   metoprolol  succinate (TOPROL -XL) 25 MG 24 hr tablet, Take 1 tablet (25 mg total) by mouth daily.   torsemide  (DEMADEX ) 20 MG tablet, Take 1 tablet (20 mg total) by mouth daily.    Current Outpatient Medications (Hematological):    clopidogrel  (PLAVIX ) 75 MG tablet, Take 1 tablet (75 mg total) by mouth daily.  Current Outpatient Medications (Other):    betamethasone dipropionate 0.05 % cream, Apply 1 application  topically as needed (rash).   famotidine  (PEPCID ) 20 MG tablet, TAKE 1 TABLET AT BEDTIME   finasteride  (PROSCAR ) 5 MG tablet, TAKE ONE TABLET BY MOUTH ONCE DAILY.   gabapentin  (NEURONTIN ) 100 MG capsule, TAKE 1 CAPSULE 4 TIMES     DAILY   magnesium  oxide (MAG-OX) 400 (240 Mg) MG tablet, Take 400 mg by mouth at bedtime.   polyethylene glycol (MIRALAX  / GLYCOLAX ) packet, Take 17 g by mouth daily as needed for moderate constipation. (Patient  taking differently: Take 17 g by mouth 2 (two) times daily.)   Reviewed prior external information including notes and imaging from  primary care provider, reviewed everything including patient's recent hospitalization for congestive heart failure. As well as notes that were available from care everywhere and other healthcare systems.  Past medical history, social, surgical and family history all reviewed in electronic medical record.  No pertanent information unless stated regarding to the chief complaint.   Review of Systems:  No headache, visual changes, nausea, vomiting, diarrhea, constipation, dizziness, abdominal pain, skin rash, fevers, chills, night sweats, weight loss, swollen  lymph nodes, joint swelling, chest pain, shortness of breath, mood changes. POSITIVE muscle aches, body aches  Objective  Blood pressure 124/68, pulse 97, height 6\' 1"  (1.854 m), weight 196 lb (88.9 kg), SpO2 99%.   General: No apparent distress alert and oriented x3 mood and affect normal, dressed appropriately.  HEENT: Pupils equal, extraocular movements intact  Sitting in a wheelchair today. Trigger nodules noted in multiple hands.  Seems to be worse on the left ring finger and left middle finger than anywhere else  Procedure: Real-time Ultrasound Guided Injection of left third and fourth flexor tendon sheaths Device: GE Logiq Q7 Ultrasound guided injection is preferred based studies that show increased duration, increased effect, greater accuracy, decreased procedural pain, increased response rate, and decreased cost with ultrasound guided versus blind injection.  Verbal informed consent obtained.  Time-out conducted.  Noted no overlying erythema, induration, or other signs of local infection.  Skin prepped in a sterile fashion.  Local anesthesia: Topical Ethyl chloride.  With sterile technique and under real time ultrasound guidance: With a 25-gauge half inch needle injected with 0.5 cc of 0.5%  Marcaine  and 0.5 cc of Kenalog  40 mg/mL at the A2 pulley of the flexor tendon sheath. Will repeat it again with a new 25-gauge needle injected with 0.5 cc of 0.5% Marcaine  and 0.5 cc into the third flexor tendon sheath Completed without difficulty  Pain immediately resolved suggesting accurate placement of the medication.  Advised to call if fevers/chills, erythema, induration, drainage, or persistent bleeding.  Impression: Technically successful ultrasound guided injection.    Impression and Recommendations:    The above documentation has been reviewed and is accurate and complete Jazon Jipson M Jadalyn Oliveri, DO

## 2023-04-09 ENCOUNTER — Ambulatory Visit: Payer: Medicare Other | Admitting: Family Medicine

## 2023-04-09 ENCOUNTER — Other Ambulatory Visit: Payer: Self-pay

## 2023-04-09 ENCOUNTER — Encounter: Payer: Self-pay | Admitting: Family Medicine

## 2023-04-09 VITALS — BP 124/68 | HR 97 | Ht 73.0 in | Wt 196.0 lb

## 2023-04-09 DIAGNOSIS — M65342 Trigger finger, left ring finger: Secondary | ICD-10-CM

## 2023-04-09 DIAGNOSIS — M65332 Trigger finger, left middle finger: Secondary | ICD-10-CM | POA: Diagnosis not present

## 2023-04-09 DIAGNOSIS — M79641 Pain in right hand: Secondary | ICD-10-CM

## 2023-04-09 DIAGNOSIS — M79642 Pain in left hand: Secondary | ICD-10-CM | POA: Diagnosis not present

## 2023-04-09 NOTE — Assessment & Plan Note (Signed)
 With exacerbation.  Repeat injection given today, tolerated the procedure well, discussed icing regimen and home exercises.  Discussed avoiding certain activities if needed.  Discussed icing regimen.  Follow-up with me again in 1 month.  May need repeat of other fingers.  Did not feel

## 2023-04-09 NOTE — Patient Instructions (Addendum)
 Injected L hand in 2 spots today Stay warm  See me in 4-6 weeks

## 2023-04-10 DIAGNOSIS — D631 Anemia in chronic kidney disease: Secondary | ICD-10-CM | POA: Diagnosis not present

## 2023-04-10 DIAGNOSIS — I130 Hypertensive heart and chronic kidney disease with heart failure and stage 1 through stage 4 chronic kidney disease, or unspecified chronic kidney disease: Secondary | ICD-10-CM | POA: Diagnosis not present

## 2023-04-10 DIAGNOSIS — I2722 Pulmonary hypertension due to left heart disease: Secondary | ICD-10-CM | POA: Diagnosis not present

## 2023-04-10 DIAGNOSIS — N1832 Chronic kidney disease, stage 3b: Secondary | ICD-10-CM | POA: Diagnosis not present

## 2023-04-10 DIAGNOSIS — I081 Rheumatic disorders of both mitral and tricuspid valves: Secondary | ICD-10-CM | POA: Diagnosis not present

## 2023-04-10 DIAGNOSIS — I5043 Acute on chronic combined systolic (congestive) and diastolic (congestive) heart failure: Secondary | ICD-10-CM | POA: Diagnosis not present

## 2023-04-11 ENCOUNTER — Other Ambulatory Visit (HOSPITAL_COMMUNITY): Payer: Self-pay

## 2023-04-16 DIAGNOSIS — N1832 Chronic kidney disease, stage 3b: Secondary | ICD-10-CM | POA: Diagnosis not present

## 2023-04-16 DIAGNOSIS — I130 Hypertensive heart and chronic kidney disease with heart failure and stage 1 through stage 4 chronic kidney disease, or unspecified chronic kidney disease: Secondary | ICD-10-CM | POA: Diagnosis not present

## 2023-04-16 DIAGNOSIS — I081 Rheumatic disorders of both mitral and tricuspid valves: Secondary | ICD-10-CM | POA: Diagnosis not present

## 2023-04-16 DIAGNOSIS — I2722 Pulmonary hypertension due to left heart disease: Secondary | ICD-10-CM | POA: Diagnosis not present

## 2023-04-16 DIAGNOSIS — I5043 Acute on chronic combined systolic (congestive) and diastolic (congestive) heart failure: Secondary | ICD-10-CM | POA: Diagnosis not present

## 2023-04-16 DIAGNOSIS — D631 Anemia in chronic kidney disease: Secondary | ICD-10-CM | POA: Diagnosis not present

## 2023-04-21 ENCOUNTER — Other Ambulatory Visit (HOSPITAL_COMMUNITY): Payer: Self-pay

## 2023-04-24 DIAGNOSIS — I5043 Acute on chronic combined systolic (congestive) and diastolic (congestive) heart failure: Secondary | ICD-10-CM | POA: Diagnosis not present

## 2023-04-24 DIAGNOSIS — I130 Hypertensive heart and chronic kidney disease with heart failure and stage 1 through stage 4 chronic kidney disease, or unspecified chronic kidney disease: Secondary | ICD-10-CM | POA: Diagnosis not present

## 2023-04-24 DIAGNOSIS — D631 Anemia in chronic kidney disease: Secondary | ICD-10-CM | POA: Diagnosis not present

## 2023-04-24 DIAGNOSIS — I081 Rheumatic disorders of both mitral and tricuspid valves: Secondary | ICD-10-CM | POA: Diagnosis not present

## 2023-04-24 DIAGNOSIS — I2722 Pulmonary hypertension due to left heart disease: Secondary | ICD-10-CM | POA: Diagnosis not present

## 2023-04-24 DIAGNOSIS — N1832 Chronic kidney disease, stage 3b: Secondary | ICD-10-CM | POA: Diagnosis not present

## 2023-05-02 ENCOUNTER — Other Ambulatory Visit (HOSPITAL_COMMUNITY): Payer: Self-pay

## 2023-05-05 ENCOUNTER — Other Ambulatory Visit (HOSPITAL_COMMUNITY): Payer: Self-pay

## 2023-05-07 NOTE — Progress Notes (Unsigned)
[name] [address] [place] [address] Phone: [phone] Subjective:   Darin [name], am serving as a scribe for Dr. Antoine Primas.  I'm seeing this patient by the request  of:  [name], MD  CC: Right knee pain  [id number]  04/09/2023 With exacerbation.  Repeat injection given today, tolerated the procedure well, discussed icing regimen and home exercises.  Discussed avoiding certain activities if needed.  Discussed icing regimen.  Follow-up with me again in 1 month.  May need repeat of other fingers.  Did not feel      Update 05/08/2023 Darin [name] is a 89 y.o. male coming in with complaint of L hand, ring finger, trigger finger. Patient states that he still has triggering in L hand, ring finger. Had relief from injection for only one week. First thing in the morning, all fingers tend to trigger.   Constant pain in R knee. Notices clicking in knee with ambulation.       Past Medical History:  Diagnosis Date   Arthritis    Chronic HFrEF (heart failure with reduced ejection fraction) (HCC)    DUPUYTREN'S CONTRACTURE, RIGHT 07/04/2009   Annotation: 4th digit Qualifier: Diagnosis of  By: Lovell Sheehan MD, Balinda Quails    GERD (gastroesophageal reflux disease)    H/O hiatal hernia    "FOR YEARS" " NO PROBLEM"   Hyperlipidemia    Hypertension    Knee pain    Neuropathy    PERIPHERAL    Osteoporosis    PMR (polymyalgia rheumatica) (HCC)    Polymyalgia (HCC) 5 YEARS   Past Surgical History:  Procedure Laterality Date   CARPAL TUNNEL RELEASE     CATARACT EXTRACTION     right   COLONOSCOPY  2007   INGUINAL HERNIA REPAIR  10/14/2011   Procedure: HERNIA REPAIR INGUINAL ADULT;  Surgeon: Shelly Rubenstein, MD;  Location: [facility];  Service: General;  Laterality: Right;  Right Inguinal Hernia Repair with Mesh   TIBIA IM NAIL INSERTION Right 05/22/2021   Procedure: INTRAMEDULLARY (IM) NAIL TIBIAL;  Surgeon: Myrene Galas, MD;   Location: [facility] OR;  Service: Orthopedics;  Laterality: Right;   TONSILLECTOMY     VARICOSE VEIN SURGERY     Social History   Socioeconomic History   Marital status: Widowed    Spouse name: Not on file   Number of children: Not on file   Years of education: Not on file   Highest education level: Master's degree (e.g., MA, MS, MEng, MEd, MSW, MBA)  Occupational History   Occupation: retired    Associate Professor: RETIRED  Tobacco Use   Smoking status: Never   Smokeless tobacco: Never  Vaping Use   Vaping status: Never Used  Substance and Sexual Activity   Alcohol use: Not Currently    Alcohol/week: 3.0 standard drinks of alcohol    Types: 3 Glasses of wine per week    Comment: half glass wine before dinner   Drug use: No   Sexual activity: Not Currently  Other Topics Concern   Not on file  Social History Narrative   Not on file   Social Drivers of Health   Financial Resource Strain: Low Risk  (08/30/2022)   Overall Financial Resource Strain (CARDIA)    Difficulty of Paying Living Expenses: Not hard at all  Food Insecurity: No Food Insecurity (02/04/2023)   Hunger Vital Sign    Worried About Running Out of Food in the Last Year: Never true  Ran Out of Food in the Last Year: Never true  Transportation Needs: No Transportation Needs (02/04/2023)   PRAPARE - Administrator, Civil Service (Medical): No    Lack of Transportation (Non-Medical): No  Physical Activity: Sufficiently Active (08/30/2022)   Exercise Vital Sign    Days of Exercise per Week: 7 days    Minutes of Exercise per Session: 40 min  Stress: No Stress Concern Present (08/30/2022)   [employer] of Occupational Health - Occupational Stress Questionnaire    Feeling of Stress : Not at all  Social Connections: Moderately Integrated (08/30/2022)   Social Connection and Isolation Panel [NHANES]    Frequency of Communication with Friends and Family: More than three times a week    Frequency of Social Gatherings  with Friends and Family: More than three times a week    Attends Religious Services: More than 4 times per year    Active Member of [employer] or Organizations: Yes    Attends Banker Meetings: More than 4 times per year    Marital Status: Widowed   Allergies  Allergen Reactions   Statins Other (See Comments)    Muscle pain, upset stomach, and leg pain   Family History  Problem Relation Age of Onset   Heart disease Father    Coronary artery disease Other     Current Outpatient Medications (Endocrine & Metabolic):    dapagliflozin propanediol (FARXIGA) 10 MG TABS tablet, Take 1 tablet (10 mg total) by mouth daily.   levothyroxine (SYNTHROID) 50 MCG tablet, Take 1 tablet (50 mcg total) by mouth daily before breakfast.  Current Outpatient Medications (Cardiovascular):    digoxin (LANOXIN) 0.125 MG tablet, Take 1 tablet (0.125 mg total) by mouth daily.   ezetimibe (ZETIA) 10 MG tablet, Take 1 tablet (10 mg total) by mouth daily.   metoprolol succinate (TOPROL-XL) 25 MG 24 hr tablet, Take 1 tablet (25 mg total) by mouth daily.   torsemide (DEMADEX) 20 MG tablet, Take 1 tablet (20 mg total) by mouth daily.    Current Outpatient Medications (Hematological):    clopidogrel (PLAVIX) 75 MG tablet, Take 1 tablet (75 mg total) by mouth daily.  Current Outpatient Medications (Other):    betamethasone dipropionate 0.05 % cream, Apply 1 application  topically as needed (rash).   famotidine (PEPCID) 20 MG tablet, TAKE 1 TABLET AT BEDTIME   finasteride (PROSCAR) 5 MG tablet, TAKE ONE TABLET BY MOUTH ONCE DAILY.   gabapentin (NEURONTIN) 100 MG capsule, TAKE 1 CAPSULE 4 TIMES     DAILY   magnesium oxide (MAG-OX) 400 (240 Mg) MG tablet, Take 400 mg by mouth at bedtime.   polyethylene glycol (MIRALAX / GLYCOLAX) packet, Take 17 g by mouth daily as needed for moderate constipation. (Patient taking differently: Take 17 g by mouth 2 (two) times daily.)   Reviewed prior external information  including notes and imaging from  primary care provider As well as notes that were available from care everywhere and other healthcare systems.  Past medical history, social, surgical and family history all reviewed in electronic medical record.  No pertanent information unless stated regarding to the chief complaint.   Review of Systems:  No headache, visual changes, nausea, vomiting, diarrhea, constipation, dizziness, abdominal pain, skin rash, fevers, chills, night sweats, weight loss, swollen lymph nodes,  chest pain, shortness of breath, mood changes. POSITIVE muscle aches, body aches, joint swelling  Objective  Blood pressure 122/68, pulse 83, height 6\' 1"  (1.854 m), SpO2  98%.   General: No apparent distress alert and oriented x3 mood and affect normal, dressed appropriately.  HEENT: Pupils equal, extraocular movements intact  Sitting in a wheelchair today. Patient does have tenderness to palpation of the knee.  Effusion noted.  After informed written and verbal consent, patient was seated on exam table. Right knee was prepped with alcohol swab and utilizing anterolateral approach, patient's right knee space was injected with 4:1  marcaine 0.5%: Kenalog 40mg /dL. Patient tolerated the procedure well without immediate complications.    Impression and Recommendations:    The above documentation has been reviewed and is accurate and complete Judi Saa, DO

## 2023-05-08 ENCOUNTER — Ambulatory Visit: Payer: Medicare Other | Admitting: Family Medicine

## 2023-05-08 ENCOUNTER — Encounter: Payer: Self-pay | Admitting: Family Medicine

## 2023-05-08 ENCOUNTER — Other Ambulatory Visit: Payer: Self-pay

## 2023-05-08 VITALS — BP 122/68 | HR 83 | Ht 73.0 in

## 2023-05-08 DIAGNOSIS — M79642 Pain in left hand: Secondary | ICD-10-CM | POA: Diagnosis not present

## 2023-05-08 DIAGNOSIS — M1711 Unilateral primary osteoarthritis, right knee: Secondary | ICD-10-CM | POA: Diagnosis not present

## 2023-05-08 NOTE — Assessment & Plan Note (Signed)
Chronic problem with exacerbation and worsening symptoms.  Due to patient's age and other social determinants of health including physical activity which is difficult patient is not a surgical candidate.  Patient is going to continue to try to work hard.  Continues to ambulate with the aid of a rolling walker.  Follow-up with me again in 10 to 12 weeks.  Could be a candidate for viscosupplementation again.

## 2023-05-08 NOTE — Patient Instructions (Addendum)
Injected in knee today Towel trick at night Will get gel approved See me again in 6-8 weeks

## 2023-05-14 ENCOUNTER — Ambulatory Visit: Payer: Medicare Other | Admitting: Family Medicine

## 2023-05-16 ENCOUNTER — Telehealth: Payer: Self-pay

## 2023-05-16 NOTE — Telephone Encounter (Signed)
 Patient scheduled for 06/18/23   [employer] authorized for right knee Deductible applies Once it has been met patient responsible for coinsurance  Secondary [employer] Will pick up there remaining eligible expenses at 20% It does cover the medicare part B deductible

## 2023-05-17 ENCOUNTER — Other Ambulatory Visit: Payer: Self-pay | Admitting: Family Medicine

## 2023-05-19 ENCOUNTER — Other Ambulatory Visit: Payer: Self-pay | Admitting: Family Medicine

## 2023-05-19 ENCOUNTER — Ambulatory Visit (INDEPENDENT_AMBULATORY_CARE_PROVIDER_SITE_OTHER): Payer: Medicare Other | Admitting: Family Medicine

## 2023-05-19 VITALS — BP 132/70 | HR 102 | Temp 97.5°F

## 2023-05-19 DIAGNOSIS — I4891 Unspecified atrial fibrillation: Secondary | ICD-10-CM

## 2023-05-19 DIAGNOSIS — I5020 Unspecified systolic (congestive) heart failure: Secondary | ICD-10-CM | POA: Diagnosis not present

## 2023-05-19 DIAGNOSIS — E039 Hypothyroidism, unspecified: Secondary | ICD-10-CM

## 2023-05-19 DIAGNOSIS — I10 Essential (primary) hypertension: Secondary | ICD-10-CM

## 2023-05-19 LAB — BASIC METABOLIC PANEL
BUN: 27 mg/dL — ABNORMAL HIGH (ref 6–23)
CO2: 31 meq/L (ref 19–32)
Calcium: 9.7 mg/dL (ref 8.4–10.5)
Chloride: 98 meq/L (ref 96–112)
Creatinine, Ser: 1.13 mg/dL (ref 0.40–1.50)
GFR: 57.57 mL/min — ABNORMAL LOW (ref >60.00)
Glucose, Bld: 103 mg/dL — ABNORMAL HIGH (ref 70–99)
Potassium: 3.8 meq/L (ref 3.5–5.1)
Sodium: 139 meq/L (ref 135–145)

## 2023-05-19 LAB — TSH: TSH: 15.43 u[IU]/mL — ABNORMAL HIGH (ref 0.35–5.50)

## 2023-05-19 MED ORDER — BENAZEPRIL HCL 5 MG PO TABS: 5.0000 mg | ORAL_TABLET | Freq: Every day | ORAL | 3 refills | Status: DC

## 2023-05-19 NOTE — Progress Notes (Signed)
 Established Patient Office Visit  Subjective   Patient ID: Darin [name], male    DOB: 21-Apr-1931  Age: 89 y.o. MRN: [id number]  Chief Complaint  Patient presents with   Medical Management of Chronic Issues    HPI   Darin [name] is seen today for medical follow-up.  Generally doing well.  He has had some recent acute on chronic right knee difficulties.  He has known severe osteoarthritis but is not a surgical candidate.  Had fluid drained off the knee by sports medicine recently.  Chronic problems include systolic heart failure, atrial fibrillation, hypertension, non-ST elevation MI several months ago (9/24), degenerative arthritis involving multiple joints, hypothyroidism.  He feels like his respiratory status is stable.  Uses support hose.  No recent increased edema.  No orthopnea.  Remains on Demadex 20 mg daily.  Past history of statin intolerance.  Does take Zetia.  Compliant with thyroid replacement currently levothyroxine 50 mcg daily.  Last TSH was 46.  His home weights have been stable.  He states he weighed 289 pounds this morning at home dressed  Patient does have history of A-fib and he declined anticoagulation.  Past Medical History:  Diagnosis Date   Arthritis    Chronic HFrEF (heart failure with reduced ejection fraction) (HCC)    DUPUYTREN'S CONTRACTURE, RIGHT 07/04/2009   Annotation: 4th digit Qualifier: Diagnosis of  By: Lovell Sheehan MD, Balinda Quails    GERD (gastroesophageal reflux disease)    H/O hiatal hernia    "FOR YEARS" " NO PROBLEM"   Hyperlipidemia    Hypertension    Knee pain    Neuropathy    PERIPHERAL    Osteoporosis    PMR (polymyalgia rheumatica) (HCC)    Polymyalgia (HCC) 5 YEARS   Past Surgical History:  Procedure Laterality Date   CARPAL TUNNEL RELEASE     CATARACT EXTRACTION     right   COLONOSCOPY  2007   INGUINAL HERNIA REPAIR  10/14/2011   Procedure: HERNIA REPAIR INGUINAL ADULT;  Surgeon: Shelly Rubenstein, MD;  Location: [facility] OR;  Service:  General;  Laterality: Right;  Right Inguinal Hernia Repair with Mesh   TIBIA IM NAIL INSERTION Right 05/22/2021   Procedure: INTRAMEDULLARY (IM) NAIL TIBIAL;  Surgeon: Myrene Galas, MD;  Location: [facility] OR;  Service: Orthopedics;  Laterality: Right;   TONSILLECTOMY     VARICOSE VEIN SURGERY      reports that he has never smoked. He has never used smokeless tobacco. He reports that he does not currently use alcohol after a past usage of about 3.0 standard drinks of alcohol per week. He reports that he does not use drugs. family history includes Coronary artery disease in an other family member; Heart disease in his father. Allergies  Allergen Reactions   Statins Other (See Comments)    Muscle pain, upset stomach, and leg pain    Review of Systems  Eyes:  Negative for blurred vision.  Respiratory:  Negative for shortness of breath.   Cardiovascular:  Negative for chest pain.  Neurological:  Negative for dizziness, weakness and headaches.      Objective:     BP 132/70 (BP Location: Left Arm, Patient Position: Sitting, Cuff Size: Normal)   Pulse (!) 102   Temp (!) 97.5 F (36.4 C) (Oral)   SpO2 98%  BP Readings from Last 3 Encounters:  05/19/23 132/70  05/08/23 122/68  04/09/23 124/68   Wt Readings from Last 3 Encounters:  04/09/23 196 lb (88.9 kg)  02/17/23 188 lb 9.6 oz (85.5 kg)  02/07/23 199 lb 15.3 oz (90.7 kg)      Physical Exam Vitals reviewed.  Constitutional:      General: He is not in acute distress.    Appearance: He is not ill-appearing.  Cardiovascular:     Rate and Rhythm: Regular rhythm.     Comments: Regular rhythm.  Rate was in the 90s. Pulmonary:     Effort: Pulmonary effort is normal.     Breath sounds: Normal breath sounds. No wheezing or rales.  Musculoskeletal:     Comments: Trace lower extremity edema bilaterally.  Does have support hose on  Neurological:     Mental Status: He is alert.      No results found for any visits on  05/19/23.  Last CBC Lab Results  Component Value Date   WBC 6.3 02/05/2023   HGB 11.1 (L) 02/05/2023   HCT 35.1 (L) 02/05/2023   MCV 95.6 02/05/2023   MCH 30.2 02/05/2023   RDW 14.6 02/05/2023   PLT 246 02/05/2023   Last metabolic panel Lab Results  Component Value Date   GLUCOSE 88 02/17/2023   NA 137 02/17/2023   K 4.9 02/17/2023   CL 98 02/17/2023   CO2 26 02/17/2023   BUN 28 (H) 02/17/2023   CREATININE 1.12 02/17/2023   EGFR 63 02/17/2023   CALCIUM 9.5 02/17/2023   PHOS 5.2 (H) 01/22/2023   PROT 7.2 02/04/2023   ALBUMIN 3.2 (L) 02/04/2023   BILITOT 0.4 02/04/2023   ALKPHOS 103 02/04/2023   AST 22 02/04/2023   ALT 21 02/04/2023   ANIONGAP 9 02/07/2023   Last lipids Lab Results  Component Value Date   CHOL 208 (H) 12/04/2022   HDL 85 12/04/2022   LDLCALC 113 (H) 12/04/2022   LDLDIRECT 148.5 12/12/2009   TRIG 48 12/04/2022   CHOLHDL 2.4 12/04/2022   Last hemoglobin A1c Lab Results  Component Value Date   HGBA1C 5.7 (H) 12/04/2022   Last thyroid functions Lab Results  Component Value Date   TSH 46.641 (H) 02/04/2023      The ASCVD Risk score (Arnett DK, et al., 2019) failed to calculate for the following reasons:   The 2019 ASCVD risk score is only valid for ages 69 to 46   Risk score cannot be calculated because patient has a medical history suggesting prior/existing ASCVD    Assessment & Plan:   #1 hypothyroidism.  Patient on levothyroxine 50 mcg daily.  Last TSH was quite high 46.  Patient compliant with therapy.  Recheck TSH today and adjust accordingly  #2 history of heart failure with reduced ejection fraction.  Symptomatically stable.  No evidence for volume overload today on exam.  Continue torsemide.  He also takes low-dose benazepril and metoprolol.  Benazepril refilled.  Recheck basic metabolic panel with his chronic diuretic therapy  #3 history of atrial fibrillation.  Appears to be in sinus rhythm at this time.  Patient declined  anticoagulation.  He is on low-dose beta-blocker  #4 hyperlipidemia.  Past history of NSTEMI in September.  Statin intolerance.  Does take Zetia   Return in about 4 months (around 09/16/2023).    Evelena Peat, MD

## 2023-05-21 MED ORDER — LEVOTHYROXINE SODIUM 75 MCG PO TABS: 75.0000 ug | ORAL_TABLET | Freq: Every day | ORAL | 0 refills | Status: DC

## 2023-05-21 NOTE — Addendum Note (Signed)
 Addended by: Christy Sartorius on: 05/21/2023 08:40 AM   Modules accepted: Orders

## 2023-05-23 ENCOUNTER — Emergency Department (HOSPITAL_COMMUNITY): Payer: Medicare Other

## 2023-05-23 ENCOUNTER — Inpatient Hospital Stay (HOSPITAL_COMMUNITY)
Admission: EM | Admit: 2023-05-23 | Discharge: 2023-05-28 | DRG: 481 | Disposition: A | Discharge status: Skilled Nursing Facility | Payer: Medicare Other | Attending: Internal Medicine | Admitting: Internal Medicine

## 2023-05-23 DIAGNOSIS — M85851 Other specified disorders of bone density and structure, right thigh: Secondary | ICD-10-CM | POA: Diagnosis not present

## 2023-05-23 DIAGNOSIS — Z79890 Hormone replacement therapy (postmenopausal): Secondary | ICD-10-CM

## 2023-05-23 DIAGNOSIS — Z7902 Long term (current) use of antithrombotics/antiplatelets: Secondary | ICD-10-CM

## 2023-05-23 DIAGNOSIS — M1711 Unilateral primary osteoarthritis, right knee: Secondary | ICD-10-CM | POA: Diagnosis not present

## 2023-05-23 DIAGNOSIS — Z66 Do not resuscitate: Secondary | ICD-10-CM | POA: Diagnosis present

## 2023-05-23 DIAGNOSIS — K219 Gastro-esophageal reflux disease without esophagitis: Secondary | ICD-10-CM | POA: Diagnosis present

## 2023-05-23 DIAGNOSIS — W010XXA Fall on same level from slipping, tripping and stumbling without subsequent striking against object, initial encounter: Secondary | ICD-10-CM | POA: Diagnosis present

## 2023-05-23 DIAGNOSIS — I10 Essential (primary) hypertension: Secondary | ICD-10-CM | POA: Diagnosis not present

## 2023-05-23 DIAGNOSIS — I4891 Unspecified atrial fibrillation: Secondary | ICD-10-CM | POA: Diagnosis not present

## 2023-05-23 DIAGNOSIS — R1310 Dysphagia, unspecified: Secondary | ICD-10-CM | POA: Diagnosis not present

## 2023-05-23 DIAGNOSIS — R531 Weakness: Secondary | ICD-10-CM | POA: Diagnosis not present

## 2023-05-23 DIAGNOSIS — W19XXXA Unspecified fall, initial encounter: Secondary | ICD-10-CM | POA: Diagnosis not present

## 2023-05-23 DIAGNOSIS — S7291XA Unspecified fracture of right femur, initial encounter for closed fracture: Principal | ICD-10-CM

## 2023-05-23 DIAGNOSIS — Z888 Allergy status to other drugs, medicaments and biological substances status: Secondary | ICD-10-CM

## 2023-05-23 DIAGNOSIS — I110 Hypertensive heart disease with heart failure: Secondary | ICD-10-CM | POA: Diagnosis not present

## 2023-05-23 DIAGNOSIS — S79101A Unspecified physeal fracture of lower end of right femur, initial encounter for closed fracture: Secondary | ICD-10-CM

## 2023-05-23 DIAGNOSIS — R2681 Unsteadiness on feet: Secondary | ICD-10-CM | POA: Diagnosis not present

## 2023-05-23 DIAGNOSIS — Z9889 Other specified postprocedural states: Secondary | ICD-10-CM | POA: Diagnosis not present

## 2023-05-23 DIAGNOSIS — S72451A Displaced supracondylar fracture without intracondylar extension of lower end of right femur, initial encounter for closed fracture: Secondary | ICD-10-CM | POA: Diagnosis present

## 2023-05-23 DIAGNOSIS — R600 Localized edema: Secondary | ICD-10-CM | POA: Diagnosis not present

## 2023-05-23 DIAGNOSIS — S72451D Displaced supracondylar fracture without intracondylar extension of lower end of right femur, subsequent encounter for closed fracture with routine healing: Secondary | ICD-10-CM | POA: Diagnosis not present

## 2023-05-23 DIAGNOSIS — S72401A Unspecified fracture of lower end of right femur, initial encounter for closed fracture: Secondary | ICD-10-CM | POA: Diagnosis not present

## 2023-05-23 DIAGNOSIS — I5042 Chronic combined systolic (congestive) and diastolic (congestive) heart failure: Secondary | ICD-10-CM | POA: Diagnosis present

## 2023-05-23 DIAGNOSIS — Y9301 Activity, walking, marching and hiking: Secondary | ICD-10-CM | POA: Diagnosis present

## 2023-05-23 DIAGNOSIS — Z79899 Other long term (current) drug therapy: Secondary | ICD-10-CM

## 2023-05-23 DIAGNOSIS — M25461 Effusion, right knee: Secondary | ICD-10-CM | POA: Diagnosis not present

## 2023-05-23 DIAGNOSIS — M353 Polymyalgia rheumatica: Secondary | ICD-10-CM | POA: Diagnosis present

## 2023-05-23 DIAGNOSIS — Z7982 Long term (current) use of aspirin: Secondary | ICD-10-CM | POA: Diagnosis not present

## 2023-05-23 DIAGNOSIS — I480 Paroxysmal atrial fibrillation: Secondary | ICD-10-CM | POA: Diagnosis not present

## 2023-05-23 DIAGNOSIS — S7290XA Unspecified fracture of unspecified femur, initial encounter for closed fracture: Secondary | ICD-10-CM | POA: Diagnosis present

## 2023-05-23 DIAGNOSIS — G63 Polyneuropathy in diseases classified elsewhere: Secondary | ICD-10-CM | POA: Diagnosis not present

## 2023-05-23 DIAGNOSIS — E785 Hyperlipidemia, unspecified: Secondary | ICD-10-CM | POA: Diagnosis present

## 2023-05-23 DIAGNOSIS — S72351A Displaced comminuted fracture of shaft of right femur, initial encounter for closed fracture: Secondary | ICD-10-CM | POA: Diagnosis not present

## 2023-05-23 DIAGNOSIS — M1990 Unspecified osteoarthritis, unspecified site: Secondary | ICD-10-CM | POA: Diagnosis not present

## 2023-05-23 DIAGNOSIS — M85871 Other specified disorders of bone density and structure, right ankle and foot: Secondary | ICD-10-CM | POA: Diagnosis not present

## 2023-05-23 DIAGNOSIS — N400 Enlarged prostate without lower urinary tract symptoms: Secondary | ICD-10-CM | POA: Diagnosis not present

## 2023-05-23 DIAGNOSIS — I509 Heart failure, unspecified: Secondary | ICD-10-CM | POA: Diagnosis not present

## 2023-05-23 DIAGNOSIS — M810 Age-related osteoporosis without current pathological fracture: Secondary | ICD-10-CM | POA: Diagnosis present

## 2023-05-23 DIAGNOSIS — Z9181 History of falling: Secondary | ICD-10-CM | POA: Diagnosis not present

## 2023-05-23 DIAGNOSIS — D649 Anemia, unspecified: Secondary | ICD-10-CM | POA: Diagnosis not present

## 2023-05-23 DIAGNOSIS — M85861 Other specified disorders of bone density and structure, right lower leg: Secondary | ICD-10-CM | POA: Diagnosis not present

## 2023-05-23 DIAGNOSIS — Z7401 Bed confinement status: Secondary | ICD-10-CM | POA: Diagnosis not present

## 2023-05-23 DIAGNOSIS — M6281 Muscle weakness (generalized): Secondary | ICD-10-CM | POA: Diagnosis not present

## 2023-05-23 DIAGNOSIS — K4090 Unilateral inguinal hernia, without obstruction or gangrene, not specified as recurrent: Secondary | ICD-10-CM | POA: Diagnosis not present

## 2023-05-23 DIAGNOSIS — E039 Hypothyroidism, unspecified: Secondary | ICD-10-CM | POA: Diagnosis present

## 2023-05-23 DIAGNOSIS — K449 Diaphragmatic hernia without obstruction or gangrene: Secondary | ICD-10-CM | POA: Diagnosis not present

## 2023-05-23 DIAGNOSIS — Z8249 Family history of ischemic heart disease and other diseases of the circulatory system: Secondary | ICD-10-CM

## 2023-05-23 DIAGNOSIS — R2689 Other abnormalities of gait and mobility: Secondary | ICD-10-CM | POA: Diagnosis not present

## 2023-05-23 DIAGNOSIS — Y92009 Unspecified place in unspecified non-institutional (private) residence as the place of occurrence of the external cause: Secondary | ICD-10-CM | POA: Diagnosis not present

## 2023-05-23 DIAGNOSIS — I5022 Chronic systolic (congestive) heart failure: Secondary | ICD-10-CM | POA: Diagnosis not present

## 2023-05-23 DIAGNOSIS — S8991XA Unspecified injury of right lower leg, initial encounter: Secondary | ICD-10-CM | POA: Diagnosis not present

## 2023-05-23 LAB — CBC
HCT: 43.6 % (ref 39.0–52.0)
Hemoglobin: 14.3 g/dL (ref 13.0–17.0)
MCH: 31.4 pg (ref 26.0–34.0)
MCHC: 32.8 g/dL (ref 30.0–36.0)
MCV: 95.6 fL (ref 80.0–100.0)
Platelets: 195 10*3/uL (ref 150–400)
RBC: 4.56 MIL/uL (ref 4.22–5.81)
RDW: 13.5 % (ref 11.5–15.5)
WBC: 10.6 10*3/uL — ABNORMAL HIGH (ref 4.0–10.5)
nRBC: 0 % (ref 0.0–0.2)

## 2023-05-23 LAB — BASIC METABOLIC PANEL
Anion gap: 11 (ref 5–15)
BUN: 22 mg/dL (ref 8–23)
CO2: 26 mmol/L (ref 22–32)
Calcium: 9.6 mg/dL (ref 8.9–10.3)
Chloride: 99 mmol/L (ref 98–111)
Creatinine, Ser: 0.89 mg/dL (ref 0.61–1.24)
GFR, Estimated: >60 mL/min (ref >60)
Glucose, Bld: 117 mg/dL — ABNORMAL HIGH (ref 70–99)
Potassium: 3.7 mmol/L (ref 3.5–5.1)
Sodium: 136 mmol/L (ref 135–145)

## 2023-05-23 MED ORDER — LEVOTHYROXINE SODIUM 75 MCG PO TABS
75.0000 ug | ORAL_TABLET | Freq: Every day | ORAL | Status: DC
  Administered 2023-05-25 – 2023-05-28 (×4): 75 ug via ORAL
  Filled 2023-05-23 (×5): qty 1

## 2023-05-23 MED ORDER — GABAPENTIN 100 MG PO CAPS
100.0000 mg | ORAL_CAPSULE | Freq: Four times a day (QID) | ORAL | Status: DC
  Administered 2023-05-23 – 2023-05-28 (×18): 100 mg via ORAL
  Filled 2023-05-23 (×18): qty 1

## 2023-05-23 MED ORDER — POLYETHYLENE GLYCOL 3350 17 G PO PACK
17.0000 g | PACK | Freq: Every day | ORAL | Status: AC | PRN
Start: 2023-05-23 — End: ?
  Administered 2023-05-25: 17 g via ORAL
  Filled 2023-05-23: qty 1

## 2023-05-23 MED ORDER — SENNOSIDES-DOCUSATE SODIUM 8.6-50 MG PO TABS
1.0000 | ORAL_TABLET | Freq: Two times a day (BID) | ORAL | Status: DC
  Administered 2023-05-23 – 2023-05-27 (×8): 1 via ORAL
  Filled 2023-05-23 (×9): qty 1

## 2023-05-23 MED ORDER — EZETIMIBE 10 MG PO TABS
10.0000 mg | ORAL_TABLET | Freq: Every day | ORAL | Status: DC
  Administered 2023-05-24 – 2023-05-28 (×5): 10 mg via ORAL
  Filled 2023-05-23 (×5): qty 1

## 2023-05-23 MED ORDER — KETOROLAC TROMETHAMINE 30 MG/ML IJ SOLN
30.0000 mg | Freq: Once | INTRAMUSCULAR | Status: AC | PRN
  Administered 2023-05-23: 30 mg via INTRAVENOUS
  Filled 2023-05-23: qty 1

## 2023-05-23 MED ORDER — HYDROCODONE-ACETAMINOPHEN 5-325 MG PO TABS
1.0000 | ORAL_TABLET | Freq: Four times a day (QID) | ORAL | Status: DC | PRN
  Administered 2023-05-23: 2 via ORAL
  Filled 2023-05-23: qty 2

## 2023-05-23 MED ORDER — ACETAMINOPHEN 325 MG PO TABS
650.0000 mg | ORAL_TABLET | Freq: Four times a day (QID) | ORAL | Status: DC | PRN
  Administered 2023-05-23 – 2023-05-26 (×4): 650 mg via ORAL
  Filled 2023-05-23 (×5): qty 2

## 2023-05-23 MED ORDER — METOPROLOL SUCCINATE ER 25 MG PO TB24
25.0000 mg | ORAL_TABLET | Freq: Every day | ORAL | Status: DC
  Administered 2023-05-24 – 2023-05-28 (×5): 25 mg via ORAL
  Filled 2023-05-23 (×5): qty 1

## 2023-05-23 MED ORDER — ENOXAPARIN SODIUM 40 MG/0.4ML IJ SOSY: 40.0000 mg | PREFILLED_SYRINGE | INTRAMUSCULAR | Status: DC

## 2023-05-23 MED ORDER — TRIAMCINOLONE ACETONIDE 0.5 % EX CREA: TOPICAL_CREAM | Freq: Every day | CUTANEOUS | Status: DC | PRN

## 2023-05-23 MED ORDER — GABAPENTIN 100 MG PO CAPS: 100.0000 mg | ORAL_CAPSULE | Freq: Three times a day (TID) | ORAL | Status: DC

## 2023-05-23 MED ORDER — MAGNESIUM OXIDE -MG SUPPLEMENT 400 (240 MG) MG PO TABS
400.0000 mg | ORAL_TABLET | Freq: Every day | ORAL | Status: DC
  Administered 2023-05-23: 400 mg via ORAL
  Filled 2023-05-23: qty 1

## 2023-05-23 MED ORDER — FAMOTIDINE 20 MG PO TABS
20.0000 mg | ORAL_TABLET | Freq: Every day | ORAL | Status: DC
  Administered 2023-05-23 – 2023-05-27 (×5): 20 mg via ORAL
  Filled 2023-05-23 (×5): qty 1

## 2023-05-23 MED ORDER — MORPHINE SULFATE (PF) 2 MG/ML IV SOLN
0.5000 mg | INTRAVENOUS | Status: DC | PRN
  Administered 2023-05-23: 0.5 mg via INTRAVENOUS
  Filled 2023-05-23: qty 1

## 2023-05-23 MED ORDER — FINASTERIDE 5 MG PO TABS
5.0000 mg | ORAL_TABLET | Freq: Every day | ORAL | Status: DC
  Administered 2023-05-23 – 2023-05-27 (×5): 5 mg via ORAL
  Filled 2023-05-23 (×5): qty 1

## 2023-05-23 MED ORDER — METHOCARBAMOL 1000 MG/10ML IJ SOLN: 500.0000 mg | Freq: Four times a day (QID) | INTRAMUSCULAR | Status: DC | PRN

## 2023-05-23 MED ORDER — METHOCARBAMOL 500 MG PO TABS
500.0000 mg | ORAL_TABLET | Freq: Four times a day (QID) | ORAL | Status: DC | PRN
  Administered 2023-05-25: 500 mg via ORAL
  Filled 2023-05-23 (×2): qty 1

## 2023-05-23 NOTE — Anesthesia Preprocedure Evaluation (Addendum)
 Anesthesia Evaluation  Patient identified by MRN, date of birth, ID band Patient awake    Reviewed: Allergy & Precautions, NPO status , Patient's Chart, lab work & pertinent test results  Airway Mallampati: II  TM Distance: >3 FB Neck ROM: Full    Dental no notable dental hx.    Pulmonary neg pulmonary ROS   Pulmonary exam normal        Cardiovascular hypertension, Pt. on medications and Pt. on home beta blockers +CHF   Rhythm:Regular Rate:Normal     Neuro/Psych negative neurological ROS  negative psych ROS   GI/Hepatic Neg liver ROS, hiatal hernia,GERD  ,,  Endo/Other  Hypothyroidism    Renal/GU   negative genitourinary   Musculoskeletal  (+) Arthritis , Osteoarthritis,    Abdominal Normal abdominal exam  (+)   Peds  Hematology  (+) Blood dyscrasia, anemia Lab Results      Component                Value               Date                      WBC                      6.3                 02/05/2023                HGB                      11.1 (L)            02/05/2023                HCT                      35.1 (L)            02/05/2023                MCV                      95.6                02/05/2023                PLT                      246                 02/05/2023              Anesthesia Other Findings   Reproductive/Obstetrics                             Anesthesia Physical Anesthesia Plan  ASA: 3  Anesthesia Plan: General   Post-op Pain Management: Ofirmev IV (intra-op)*   Induction: Intravenous  PONV Risk Score and Plan: 2 and Ondansetron, Dexamethasone, Propofol infusion and Treatment may vary due to age or medical condition  Airway Management Planned: Mask and Oral ETT  Additional Equipment: None  Intra-op Plan:   Post-operative Plan: Extubation in OR  Informed Consent: I have reviewed the patients History and Physical, chart, labs and discussed the  procedure including the risks, benefits and alternatives for the proposed  anesthesia with the patient or authorized representative who has indicated his/her understanding and acceptance.     Dental advisory given  Plan Discussed with: CRNA  Anesthesia Plan Comments:        Anesthesia Quick Evaluation

## 2023-05-23 NOTE — ED Provider Notes (Signed)
 [place] EMERGENCY DEPARTMENT AT [facility] Provider Note   CSN: [id number] Arrival date & time: 05/23/23  1102     History  Chief Complaint  Patient presents with  . Fall  . Leg Pain    Darin [name] is a 89 y.o. male presented to ED with complaint of right lower leg pain.  Patient reports that he tripped today while walking around his house with a walker and bent his right leg back under him.  He reports he is having pain in the right lower leg and the right ankle.  He says typically he is able to walk 1 or 2 laps with a walker around his house but then uses a wheelchair otherwise.  He lives by himself.  He denies head injury today.  He is on Plavix but denies other blood thinning medication.  HPI     Home Medications Prior to Admission medications   Medication Sig Start Date End Date Taking? Authorizing Provider  benazepril (LOTENSIN) 5 MG tablet Take 1 tablet (5 mg total) by mouth daily. 05/19/23   Burchette, Elberta Fortis, MD  betamethasone dipropionate 0.05 % cream Apply 1 application  topically as needed (rash).    [provider]  clopidogrel (PLAVIX) 75 MG tablet Take 1 tablet (75 mg total) by mouth daily. 12/06/22   Almon Hercules, MD  dapagliflozin propanediol (FARXIGA) 10 MG TABS tablet Take 1 tablet (10 mg total) by mouth daily. 02/28/23   Croitoru, Mihai, MD  digoxin (LANOXIN) 0.125 MG tablet Take 1 tablet (0.125 mg total) by mouth daily. 02/08/23 06/10/23  Lorin Glass, MD  ezetimibe (ZETIA) 10 MG tablet Take 1 tablet (10 mg total) by mouth daily. 02/25/23   Croitoru, Mihai, MD  famotidine (PEPCID) 20 MG tablet TAKE 1 TABLET AT BEDTIME 05/19/23   Burchette, Elberta Fortis, MD  finasteride (PROSCAR) 5 MG tablet TAKE ONE TABLET BY MOUTH ONCE DAILY. 02/26/23   Burchette, Elberta Fortis, MD  gabapentin (NEURONTIN) 100 MG capsule TAKE 1 CAPSULE 4 TIMES     DAILY 05/19/23   Burchette, Elberta Fortis, MD  levothyroxine (SYNTHROID) 75 MCG tablet Take 1 tablet (75 mcg total) by mouth  daily. 05/21/23   Burchette, Elberta Fortis, MD  magnesium oxide (MAG-OX) 400 (240 Mg) MG tablet Take 400 mg by mouth at bedtime.    [provider]  metoprolol succinate (TOPROL-XL) 25 MG 24 hr tablet Take 1 tablet (25 mg total) by mouth daily. 02/08/23 05/09/23  Lorin Glass, MD  polyethylene glycol (MIRALAX / GLYCOLAX) packet Take 17 g by mouth daily as needed for moderate constipation. Patient taking differently: Take 17 g by mouth 2 (two) times daily. 03/16/17   Glade Lloyd, MD  torsemide (DEMADEX) 20 MG tablet Take 1 tablet (20 mg total) by mouth daily. 02/25/23 05/26/23  Croitoru, Mihai, MD      Allergies    Statins    Review of Systems   Review of Systems  Physical Exam Updated Vital Signs BP 126/64 (BP Location: Right Arm)   Pulse 87   Temp 98.4 F (36.9 C) (Oral)   Resp 16   Ht 6\' 1"  (1.854 m)   Wt 85.7 kg   SpO2 99%   BMI 24.94 kg/m  Physical Exam Constitutional:      General: He is not in acute distress. HENT:     Head: Normocephalic and atraumatic.  Eyes:     Conjunctiva/sclera: Conjunctivae normal.     Pupils: Pupils are equal, round, and reactive  to light.  Cardiovascular:     Rate and Rhythm: Normal rate and regular rhythm.  Pulmonary:     Effort: Pulmonary effort is normal. No respiratory distress.  Abdominal:     General: There is no distension.     Tenderness: There is no abdominal tenderness.  Musculoskeletal:     Comments: Mild tenderness with ecchymoses involving the right lateral malleoli and the anterior talofibular ligament, no midfoot tenderness or discoloration, no patellar isolated tenderness or tibial plateau tenderness  Distal femur ttp  Skin:    General: Skin is warm and dry.  Neurological:     General: No focal deficit present.     Mental Status: He is alert. Mental status is at baseline.  Psychiatric:        Mood and Affect: Mood normal.        Behavior: Behavior normal.     ED Results / Procedures / Treatments   Labs (all  labs ordered are listed, but only abnormal results are displayed) Labs Reviewed  BASIC METABOLIC PANEL  CBC    EKG None  Radiology DG Tibia/Fibula Right Result Date: 05/23/2023 CLINICAL DATA:  Fall.  Right lower extremity pain. EXAM: RIGHT TIBIA AND FIBULA - 2 VIEW; RIGHT FEMUR 2 VIEWS; RIGHT ANKLE - COMPLETE 3+ VIEW COMPARISON:  02/22/2015. FINDINGS: There is diffuse osteopenia of the visualized osseous structures. There is displaced, angulated and comminuted fracture of the distal right femur. No intra-articular extension seen. There is old healed fracture of the proximal right tibia, status post intramedullary nail placement. No other acute fracture or dislocation. No aggressive osseous lesion. Mild-to-moderate degenerative changes of the knee joint. No significant degenerative changes of the right hip joint and ankle joint. Ankle mortise appears intact. No focal soft tissue swelling. No radiopaque foreign bodies. IMPRESSION: *Displaced, angulated and comminuted fracture of the distal right femur. No intra-articular extension. Electronically Signed   By: Jules Schick M.D.   On: 05/23/2023 13:11   DG Ankle Complete Right Result Date: 05/23/2023 CLINICAL DATA:  Fall.  Right lower extremity pain. EXAM: RIGHT TIBIA AND FIBULA - 2 VIEW; RIGHT FEMUR 2 VIEWS; RIGHT ANKLE - COMPLETE 3+ VIEW COMPARISON:  02/22/2015. FINDINGS: There is diffuse osteopenia of the visualized osseous structures. There is displaced, angulated and comminuted fracture of the distal right femur. No intra-articular extension seen. There is old healed fracture of the proximal right tibia, status post intramedullary nail placement. No other acute fracture or dislocation. No aggressive osseous lesion. Mild-to-moderate degenerative changes of the knee joint. No significant degenerative changes of the right hip joint and ankle joint. Ankle mortise appears intact. No focal soft tissue swelling. No radiopaque foreign bodies. IMPRESSION:  *Displaced, angulated and comminuted fracture of the distal right femur. No intra-articular extension. Electronically Signed   By: Jules Schick M.D.   On: 05/23/2023 13:11   DG FEMUR, MIN 2 VIEWS RIGHT Result Date: 05/23/2023 CLINICAL DATA:  Fall.  Right lower extremity pain. EXAM: RIGHT TIBIA AND FIBULA - 2 VIEW; RIGHT FEMUR 2 VIEWS; RIGHT ANKLE - COMPLETE 3+ VIEW COMPARISON:  02/22/2015. FINDINGS: There is diffuse osteopenia of the visualized osseous structures. There is displaced, angulated and comminuted fracture of the distal right femur. No intra-articular extension seen. There is old healed fracture of the proximal right tibia, status post intramedullary nail placement. No other acute fracture or dislocation. No aggressive osseous lesion. Mild-to-moderate degenerative changes of the knee joint. No significant degenerative changes of the right hip joint and ankle joint. Ankle mortise  appears intact. No focal soft tissue swelling. No radiopaque foreign bodies. IMPRESSION: *Displaced, angulated and comminuted fracture of the distal right femur. No intra-articular extension. Electronically Signed   By: Jules Schick M.D.   On: 05/23/2023 13:11    Procedures Procedures    Medications Ordered in ED Medications  ketorolac (TORADOL) 30 MG/ML injection 30 mg (has no administration in time range)  acetaminophen (TYLENOL) tablet 650 mg (has no administration in time range)    ED Course/ Medical Decision Making/ A&P Clinical Course as of 05/23/23 1514  Fri May 23, 2023  1347 Secretary to page ortho trauma service now [MT]  1349 Pt updated regarding fracture and need for admission and he is in agreement [MT]  1410 Dr Oswaldo Milian by phone - plan for possible OR tomorrow, NPO at midnight, admit to hospitalist for medical optimization [MT]  1412 Orthopedic surgeon requesting long leg splint, CT imaging after splint for pre-op planning. [MT]  1513 Patient signed out to Dr. Palma Holter EDP pending admission  labs and hospital admission here at [facility] [MT]    Clinical Course User Index [MT] Terald Sleeper, MD                                 Medical Decision Making Amount and/or Complexity of Data Reviewed Labs: ordered. Radiology: ordered.  Risk OTC drugs. Prescription drug management. Decision regarding hospitalization.   Patient is to the fall and isolated injury of the right lower extremity.  He is neurovascularly intact.  X-rays are ordered and personally reviewed interpreted, notable for angulated closed distal femur fracture right leg  No other traumatic injuries noted on exam.  Labs pending for admission  I consulted with orthopedic trauma service, who recommended        Final Clinical Impression(s) / ED Diagnoses Final diagnoses:  Closed fracture of right femur, unspecified fracture morphology, unspecified portion of femur, initial encounter [facility])    Rx / DC Orders ED Discharge Orders     None         Terald Sleeper, MD 05/23/23 [phone]

## 2023-05-23 NOTE — ED Provider Notes (Signed)
 5:02 PM Assumed care of patient from off-going team. For more details, please see note from same day.  In brief, this is a 89 y.o. male who fell at home w/ closed distal right femur fracture. Posterior long leg splint in place. CT scan ordered per ortho.  Can stay here rather than transfer to [facility].  Plan/Dispo at time of sign-out & ED Course since sign-out: [ ]  labs for admission  BP (!) 116/57   Pulse 77   Temp 98.7 F (37.1 C) (Oral)   Resp 16   Ht 6\' 1"  (1.854 m)   Wt 85.7 kg   SpO2 94%   BMI 24.94 kg/m    ED Course:   Clinical Course as of 05/23/23 1702  Fri May 23, 2023  1347 Secretary to page ortho trauma service now [MT]  1349 Pt updated regarding fracture and need for admission and he is in agreement [MT]  1410 Dr Oswaldo Milian by phone - plan for possible OR tomorrow, NPO at midnight, admit to hospitalist for medical optimization [MT]  1412 Orthopedic surgeon requesting long leg splint, CT imaging after splint for pre-op planning. [MT]  1513 Patient signed out to Dr. Palma Holter EDP pending admission labs and hospital admission here at [facility] [MT]  1702 Patient's labs unremarkable in context of presentation. Admitted to medicine.  [HN]    Clinical Course User Index [HN] Loetta Rough, MD [MT] Terald Sleeper, MD    Dispo: Admit ------------------------------- Vivi Barrack, MD Emergency Medicine  This note was created using dictation software, which may contain spelling or grammatical errors.   Loetta Rough, MD 05/23/23 [phone]

## 2023-05-23 NOTE — H&P (View-Only) (Signed)
 ORTHOPAEDIC CONSULTATION  REQUESTING PHYSICIAN: Trifan, Kermit Balo, MD  Chief Complaint: right leg pain  HPI: Darin [name] is a 89 y.o. male who complains of acute right leg and right ankle pain.  Uses wheelchair at home and usually able to do 1-2 laps around his living area at home per day.  Was on his second lap when he tripped and gently fell, which caused his right leg to bend back under him.  Patient felt instant pain and noticed difficulty bearing weight on his right leg.  No significant swelling.  No other complaints.  Some decreased sensation of bilateral feet at baseline.  Lives alone, has help with [facility] aide.  On plavix.  Past Medical History:  Diagnosis Date   Arthritis    Chronic HFrEF (heart failure with reduced ejection fraction) (HCC)    DUPUYTREN'S CONTRACTURE, RIGHT 07/04/2009   Annotation: 4th digit Qualifier: Diagnosis of  By: Lovell Sheehan MD, Balinda Quails    GERD (gastroesophageal reflux disease)    H/O hiatal hernia    "FOR YEARS" " NO PROBLEM"   Hyperlipidemia    Hypertension    Knee pain    Neuropathy    PERIPHERAL    Osteoporosis    PMR (polymyalgia rheumatica) (HCC)    Polymyalgia (HCC) 5 YEARS   Past Surgical History:  Procedure Laterality Date   CARPAL TUNNEL RELEASE     CATARACT EXTRACTION     right   COLONOSCOPY  2007   INGUINAL HERNIA REPAIR  10/14/2011   Procedure: HERNIA REPAIR INGUINAL ADULT;  Surgeon: Shelly Rubenstein, MD;  Location: [facility] OR;  Service: General;  Laterality: Right;  Right Inguinal Hernia Repair with Mesh   TIBIA IM NAIL INSERTION Right 05/22/2021   Procedure: INTRAMEDULLARY (IM) NAIL TIBIAL;  Surgeon: Myrene Galas, MD;  Location: [facility] OR;  Service: Orthopedics;  Laterality: Right;   TONSILLECTOMY     VARICOSE VEIN SURGERY     Social History   Socioeconomic History   Marital status: Widowed    Spouse name: Not on file   Number of children: Not on file   Years of education: Not on file   Highest education level: Master's degree (e.g.,  MA, MS, MEng, MEd, MSW, MBA)  Occupational History   Occupation: retired    Associate Professor: RETIRED  Tobacco Use   Smoking status: Never   Smokeless tobacco: Never  Vaping Use   Vaping status: Never Used  Substance and Sexual Activity   Alcohol use: Not Currently    Alcohol/week: 3.0 standard drinks of alcohol    Types: 3 Glasses of wine per week    Comment: half glass wine before dinner   Drug use: No   Sexual activity: Not Currently  Other Topics Concern   Not on file  Social History Narrative   Not on file   Social Drivers of Health   Financial Resource Strain: Low Risk  (08/30/2022)   Overall Financial Resource Strain (CARDIA)    Difficulty of Paying Living Expenses: Not hard at all  Food Insecurity: No Food Insecurity (02/04/2023)   Hunger Vital Sign    Worried About Running Out of Food in the Last Year: Never true    Ran Out of Food in the Last Year: Never true  Transportation Needs: No Transportation Needs (02/04/2023)   PRAPARE - Administrator, Civil Service (Medical): No    Lack of Transportation (Non-Medical): No  Physical Activity: Sufficiently Active (08/30/2022)   Exercise Vital Sign    Days  of Exercise per Week: 7 days    Minutes of Exercise per Session: 40 min  Stress: No Stress Concern Present (08/30/2022)   [employer] of Occupational Health - Occupational Stress Questionnaire    Feeling of Stress : Not at all  Social Connections: Moderately Integrated (08/30/2022)   Social Connection and Isolation Panel [NHANES]    Frequency of Communication with Friends and Family: More than three times a week    Frequency of Social Gatherings with Friends and Family: More than three times a week    Attends Religious Services: More than 4 times per year    Active Member of [employer] or Organizations: Yes    Attends Banker Meetings: More than 4 times per year    Marital Status: Widowed   Family History  Problem Relation Age of Onset   Heart disease  Father    Coronary artery disease Other    Allergies  Allergen Reactions   Statins Other (See Comments)    Muscle pain, upset stomach, and leg pain     Positive ROS: All other systems have been reviewed and were otherwise negative with the exception of those mentioned in the HPI and as above.  Physical Exam: General: Alert, no acute distress Cardiovascular: No pedal edema Respiratory: No cyanosis, no use of accessory musculature Skin: No lesions in the area of chief complaint Neurologic: Sensation intact distally Psychiatric: Patient is competent for consent with normal mood and affect  MUSCULOSKELETAL:  RLE Visual exam limited due to long leg splint.  Splint bandages appear intact.   Right lower leg tender to palpation  ROM deferred due to known fracture  No ankle effusion  Wiggles toes appropriately  Tolerates gentle dorsiflexion and plantarflexion  Mild decreased sensation of bilateral feet, otherwise right lower extremity appears grossly neurologically intact  DP 1+, PT 1+, CPR < 2.  No significant edema, see photo below   IMAGING: x-ray of right femur shows displaced, angulated, comminuted fracture of right distal femur.  X-rays of tibia/fibula and ankle are unremarkable.  Assessment: Active Problems:   * No active hospital problems. *   Acute distal right femur fracture, closed, displaced  Plan: Discussed case with attending Dr. Blanchie Dessert.  Patient unfortunately has sustained a distal femur fracture.  At this point surgical intervention is recommended to fixate and stabilize the fracture via open reduction internal fixation.   The risks benefits and alternatives were discussed with the patient including but not limited to the risks of nonoperative treatment, versus surgical intervention including infection, bleeding, nerve injury, malunion, nonunion, the need for revision surgery, hardware prominence, hardware failure, the need for hardware removal, blood clots,  cardiopulmonary complications, morbidity, mortality, among others, and they were willing to proceed.     Plan for surgery tomorrow pending OR availability.  Hold plavix starting today.  Long leg splint in place, CT of right femur pending for surgical planning.  Non-weight bearing for the time being.  NPO past midnight.    Cecil Cobbs, PA-C  Contact information:   [phone] 7am-5pm epic message Dr. Blanchie Dessert, or call office for patient follow up: [phone] After hours and holidays please check [email or web address] for group call information for [facility]

## 2023-05-23 NOTE — H&P (Addendum)
 [facility] History and Physical  Darin [name] MWN:[id number] DOB: 11/08/1931 DOA: 05/23/2023  Referring physician: ED  PCP: [name], MD   Patient is coming from: Home  Chief Complaint: Right leg pain status post fall.  HPI:  Darin [name] is a 89 y.o. male with past medical history of chronic heart failure, GERD, hyperlipidemia, hypertension, osteoporosis, polymyalgia rheumatica presented to ED with complaint of right leg pain.  Patient stated that he was walking with a walker and he tripped and fell.  No mention of dizziness lightheadedness chest pain prior to fall.  No syncope.Darin [name]  He then subsequently started having pain in the lower leg including right ankle.  Usually patient is able to do 1-2 laps with a walker in his house and also has a wheelchair.  Lives by himself and has aide helping him.  Patient denied any chest pain, shortness of breath, dyspnea, dizziness, lightheadedness or syncope.  Denied any fever, chills or rigor.  Denies any urinary urgency, frequency or dysuria.  Denies any nausea, vomiting or abdominal pain.   In the ED, patient had stable vitals.  CBC showed a WBC elevated at 10.6.  BMP with creatinine at 0.8.  Imaging in the ED showed displaced angulated and comminuted fracture of the distal right femur.  Long-leg splint was placed in.  Orthopedics was consulted from the ED and patient was considered for admission to the hospital for further evaluation and likely surgical treatment.   Assessment and Plan Principal Problem:   Femur fracture (HCC) Active Problems:   Essential hypertension   GERD (gastroesophageal reflux disease)   Hypothyroid   Fall at home, initial encounter  Right distal femur displaced and comminuted fracture.  Orthopedics  has seen the patient and patient will be n.p.o. after midnight.  Plan for surgical intervention in AM.  Continue fracture protocols, immobilization, pain management.  History of chronic heart failure.   Currently compensated.  Takes torsemide 20 mg daily, digoxin and Farxiga at home.  Continue metoprolol.  Hold off with torsemide and [place].  Check digoxin level..  Will get EKG.  GERD Continue Pepcid.  Hyperlipidemia On Zetia at home.  Will resume.  Hypertension Will continue to monitor.  Patient takes benazepril metoprolol at home.  Will resume metoprolol for now.  Hypothyroidism.  On Synthroid at home.  Check TSH.  History of polymyalgia rheumatica.  Patient might need rehabilitation on discharge.  Has a home health aide and walker or wheelchair at home.  DVT Prophylaxis: Lovenox starting tomorrow.  Review of Systems:  All systems were reviewed and were negative unless otherwise mentioned in the HPI   Past Medical History:  Diagnosis Date   Arthritis    Chronic HFrEF (heart failure with reduced ejection fraction) (HCC)    DUPUYTREN'S CONTRACTURE, RIGHT 07/04/2009   Annotation: 4th digit Qualifier: Diagnosis of  By: Lovell Sheehan MD, Balinda Quails    GERD (gastroesophageal reflux disease)    H/O hiatal hernia    "FOR YEARS" " NO PROBLEM"   Hyperlipidemia    Hypertension    Knee pain    Neuropathy    PERIPHERAL    Osteoporosis    PMR (polymyalgia rheumatica) (HCC)    Polymyalgia (HCC) 5 YEARS   Past Surgical History:  Procedure Laterality Date   CARPAL TUNNEL RELEASE     CATARACT EXTRACTION     right   COLONOSCOPY  2007   INGUINAL HERNIA REPAIR  10/14/2011   Procedure: HERNIA REPAIR INGUINAL ADULT;  Surgeon: Shelly Rubenstein,  MD;  Location: [facility] OR;  Service: General;  Laterality: Right;  Right Inguinal Hernia Repair with Mesh   TIBIA IM NAIL INSERTION Right 05/22/2021   Procedure: INTRAMEDULLARY (IM) NAIL TIBIAL;  Surgeon: Myrene Galas, MD;  Location: [facility] OR;  Service: Orthopedics;  Laterality: Right;   TONSILLECTOMY     VARICOSE VEIN SURGERY      Social History:  reports that he has never smoked. He has never used smokeless tobacco. He reports that he does not currently use  alcohol after a past usage of about 3.0 standard drinks of alcohol per week. He reports that he does not use drugs.  Allergies  Allergen Reactions   Statins Other (See Comments)    Muscle pain, upset stomach, and leg pain    Family History  Problem Relation Age of Onset   Heart disease Father    Coronary artery disease Other      Prior to Admission medications   Medication Sig Start Date End Date Taking? Authorizing Provider  benazepril (LOTENSIN) 5 MG tablet Take 1 tablet (5 mg total) by mouth daily. 05/19/23   Burchette, Elberta Fortis, MD  betamethasone dipropionate 0.05 % cream Apply 1 application  topically as needed (rash).    [provider]  clopidogrel (PLAVIX) 75 MG tablet Take 1 tablet (75 mg total) by mouth daily. 12/06/22   Almon Hercules, MD  dapagliflozin propanediol (FARXIGA) 10 MG TABS tablet Take 1 tablet (10 mg total) by mouth daily. 02/28/23   Croitoru, Mihai, MD  digoxin (LANOXIN) 0.125 MG tablet Take 1 tablet (0.125 mg total) by mouth daily. 02/08/23 06/10/23  Lorin Glass, MD  ezetimibe (ZETIA) 10 MG tablet Take 1 tablet (10 mg total) by mouth daily. 02/25/23   Croitoru, Mihai, MD  famotidine (PEPCID) 20 MG tablet TAKE 1 TABLET AT BEDTIME 05/19/23   Burchette, Elberta Fortis, MD  finasteride (PROSCAR) 5 MG tablet TAKE ONE TABLET BY MOUTH ONCE DAILY. 02/26/23   Burchette, Elberta Fortis, MD  gabapentin (NEURONTIN) 100 MG capsule TAKE 1 CAPSULE 4 TIMES     DAILY 05/19/23   Burchette, Elberta Fortis, MD  levothyroxine (SYNTHROID) 75 MCG tablet Take 1 tablet (75 mcg total) by mouth daily. 05/21/23   Burchette, Elberta Fortis, MD  magnesium oxide (MAG-OX) 400 (240 Mg) MG tablet Take 400 mg by mouth at bedtime.    [provider]  metoprolol succinate (TOPROL-XL) 25 MG 24 hr tablet Take 1 tablet (25 mg total) by mouth daily. 02/08/23 05/09/23  Lorin Glass, MD  polyethylene glycol (MIRALAX / GLYCOLAX) packet Take 17 g by mouth daily as needed for moderate constipation. Patient taking differently:  Take 17 g by mouth 2 (two) times daily. 03/16/17   Glade Lloyd, MD  torsemide (DEMADEX) 20 MG tablet Take 1 tablet (20 mg total) by mouth daily. 02/25/23 05/26/23  Croitoru, Rachelle Hora, MD    Physical Exam:  Vitals:   05/23/23 1109 05/23/23 1124 05/23/23 1145 05/23/23 1649  BP:   126/64 (!) 116/57  Pulse:   87 77  Resp:   16 16  Temp:   98.4 F (36.9 C) 98.7 F (37.1 C)  TempSrc:   Oral Oral  SpO2: 99%  99% 94%  Weight:  85.7 kg    Height:  6\' 1"  (1.854 m)     Wt Readings from Last 3 Encounters:  05/23/23 85.7 kg  04/09/23 88.9 kg  02/17/23 85.5 kg   Body mass index is 24.94 kg/m.  General:  Average built, not in  obvious distress, elderly male, Communicative, not in distress. HENT: Normocephalic, No scleral pallor or icterus noted. Oral mucosa is moist.  Chest:  Clear breath sounds.  . No crackles or wheezes.  CVS: S1 &S2 heard. No murmur.  Regular rate and rhythm. Abdomen: Soft, nontender, nondistended.  Bowel sounds are heard. No abdominal mass palpated Extremities: No cyanosis, clubbing or edema.  Distal capillary refill present.  Right leg with long-leg splint. Psych: Alert, awake and oriented, normal mood CNS:  No cranial nerve deficits.  Skin: Warm and dry.  No rashes noted.  Labs on Admission:   CBC: Recent Labs  Lab 05/23/23 1542  WBC 10.6*  HGB 14.3  HCT 43.6  MCV 95.6  PLT 195    Basic Metabolic Panel: Recent Labs  Lab 05/19/23 1211 05/23/23 1542  NA 139 136  K 3.8 3.7  CL 98 99  CO2 31 26  GLUCOSE 103* 117*  BUN 27* 22  CREATININE 1.13 0.89  CALCIUM 9.7 9.6    Liver Function Tests: No results for input(s): "AST", "ALT", "ALKPHOS", "BILITOT", "PROT", "ALBUMIN" in the last 168 hours. No results for input(s): "LIPASE", "AMYLASE" in the last 168 hours. No results for input(s): "AMMONIA" in the last 168 hours.  Cardiac Enzymes: No results for input(s): "CKTOTAL", "CKMB", "CKMBINDEX", "TROPONINI" in the last 168 hours.  BNP (last 3  results) Recent Labs    12/03/22 0931 01/21/23 1704 02/04/23 0802  BNP 281.1* 604.5* 715.8*    ProBNP (last 3 results) No results for input(s): "PROBNP" in the last 8760 hours.  CBG: No results for input(s): "GLUCAP" in the last 168 hours.  Lipase  No results found for: "LIPASE"    Radiological Exams on Admission: CT FEMUR RIGHT WO CONTRAST Result Date: 05/23/2023 CLINICAL DATA:  Upper leg trauma.  Distal femur fracture. EXAM: CT OF THE LOWER RIGHT EXTREMITY WITHOUT CONTRAST TECHNIQUE: Multidetector CT imaging of the right 5 was performed according to the standard protocol. RADIATION DOSE REDUCTION: This exam was performed according to the departmental dose-optimization program which includes automated exposure control, adjustment of the mA and/or kV according to patient size and/or use of iterative reconstruction technique. COMPARISON:  Radiographs same date. FINDINGS: Bones/Joint/Cartilage Examination includes the entire right thigh. There is some motion on the images through the distal femur. Additional imaging (in a separate acquisition) of the knee was obtained. There is a comminuted and displaced fracture of the distal right femoral metadiaphysis. There is an associated 7.4 cm butterfly fragment anteriorly. There is posteromedial displacement of the distal fragment. This fracture demonstrates no definite intercondylar extension or involvement of the articular surface of the distal femur. There are advanced underlying tricompartmental degenerative changes at the right knee with associated joint space narrowing and osteophytes. Patient is status post proximal tibial intramedullary nail fixation. There are old healed fractures of the proximal tibia and fibula. Small knee joint effusion without definite lipohemarthrosis. No proximal femoral injury identified. Mild right hip degenerative changes without significant hip joint effusion. Ligaments Suboptimally assessed by CT. Muscles and Tendons  The extensor mechanism at the knee is intact. There is fragmented spurring at the quadriceps insertion on the patella. No intramuscular hematoma or focal atrophy identified. Soft tissues There is ill-defined soft tissue hemorrhage posteriorly in the distal right thigh at the level of the comminuted fracture. No focal hematoma, foreign body or soft tissue emphysema identified. Diffuse femoropopliteal atherosclerosis. Right inguinal hernia containing bowel noted incidentally. IMPRESSION: 1. Comminuted and displaced fracture of the distal right femoral metadiaphysis  with associated 7.4 cm butterfly fragment anteriorly. No definite intercondylar extension or involvement of the articular surface of the distal femur. 2. Advanced tricompartmental degenerative changes at the right knee. 3. Old healed fractures of the proximal tibia and fibula status post proximal tibial intramedullary nail fixation. 4. Ill-defined soft tissue hemorrhage posteriorly in the distal right thigh. 5. Right inguinal hernia containing bowel noted incidentally. Electronically Signed   By: Carey Bullocks M.D.   On: 05/23/2023 16:37   DG Tibia/Fibula Right Result Date: 05/23/2023 CLINICAL DATA:  Fall.  Right lower extremity pain. EXAM: RIGHT TIBIA AND FIBULA - 2 VIEW; RIGHT FEMUR 2 VIEWS; RIGHT ANKLE - COMPLETE 3+ VIEW COMPARISON:  02/22/2015. FINDINGS: There is diffuse osteopenia of the visualized osseous structures. There is displaced, angulated and comminuted fracture of the distal right femur. No intra-articular extension seen. There is old healed fracture of the proximal right tibia, status post intramedullary nail placement. No other acute fracture or dislocation. No aggressive osseous lesion. Mild-to-moderate degenerative changes of the knee joint. No significant degenerative changes of the right hip joint and ankle joint. Ankle mortise appears intact. No focal soft tissue swelling. No radiopaque foreign bodies. IMPRESSION: *Displaced,  angulated and comminuted fracture of the distal right femur. No intra-articular extension. Electronically Signed   By: Jules Schick M.D.   On: 05/23/2023 13:11   DG Ankle Complete Right Result Date: 05/23/2023 CLINICAL DATA:  Fall.  Right lower extremity pain. EXAM: RIGHT TIBIA AND FIBULA - 2 VIEW; RIGHT FEMUR 2 VIEWS; RIGHT ANKLE - COMPLETE 3+ VIEW COMPARISON:  02/22/2015. FINDINGS: There is diffuse osteopenia of the visualized osseous structures. There is displaced, angulated and comminuted fracture of the distal right femur. No intra-articular extension seen. There is old healed fracture of the proximal right tibia, status post intramedullary nail placement. No other acute fracture or dislocation. No aggressive osseous lesion. Mild-to-moderate degenerative changes of the knee joint. No significant degenerative changes of the right hip joint and ankle joint. Ankle mortise appears intact. No focal soft tissue swelling. No radiopaque foreign bodies. IMPRESSION: *Displaced, angulated and comminuted fracture of the distal right femur. No intra-articular extension. Electronically Signed   By: Jules Schick M.D.   On: 05/23/2023 13:11   DG FEMUR, MIN 2 VIEWS RIGHT Result Date: 05/23/2023 CLINICAL DATA:  Fall.  Right lower extremity pain. EXAM: RIGHT TIBIA AND FIBULA - 2 VIEW; RIGHT FEMUR 2 VIEWS; RIGHT ANKLE - COMPLETE 3+ VIEW COMPARISON:  02/22/2015. FINDINGS: There is diffuse osteopenia of the visualized osseous structures. There is displaced, angulated and comminuted fracture of the distal right femur. No intra-articular extension seen. There is old healed fracture of the proximal right tibia, status post intramedullary nail placement. No other acute fracture or dislocation. No aggressive osseous lesion. Mild-to-moderate degenerative changes of the knee joint. No significant degenerative changes of the right hip joint and ankle joint. Ankle mortise appears intact. No focal soft tissue swelling. No radiopaque  foreign bodies. IMPRESSION: *Displaced, angulated and comminuted fracture of the distal right femur. No intra-articular extension. Electronically Signed   By: Jules Schick M.D.   On: 05/23/2023 13:11    EKG: No EKG to review.   Consultant: Orthopedics Dr. Blanchie Dessert  Code Status: DNR  Microbiology none  Antibiotics: None  Family Communication:  Patients' condition and plan of care including tests being ordered have been discussed with the patient and the patient's cousin, [name] who indicate understanding and agree with the plan.   Status is: Inpatient  Severity of  Illness: The appropriate patient status for this patient is INPATIENT. Inpatient status is judged to be reasonable and necessary in order to provide the required intensity of service to ensure the patient's safety. The patient's presenting symptoms, physical exam findings, and initial radiographic and laboratory data in the context of their chronic comorbidities is felt to place them at high risk for further clinical deterioration. Furthermore, it is not anticipated that the patient will be medically stable for discharge from the hospital within 2 midnights of admission.   * I certify that at the point of admission it is my clinical judgment that the patient will require inpatient hospital care spanning beyond 2 midnights from the point of admission due to high intensity of service, high risk for further deterioration and high frequency of surveillance required.*  Signed, Joycelyn Das, MD [facility] 05/23/2023

## 2023-05-23 NOTE — Hospital Course (Signed)
 Darin [name] is a 89 y.o. male with past medical history of chronic heart failure, GERD, hyperlipidemia, hypertension, osteoporosis, polymyalgia rheumatica presented to ED with complaint of right leg pain.  Patient stated that he was walking with a walker and he tripped and fell.  No mention of dizziness lightheadedness chest pain urge syncope.  He then subsequently started having pain in the lower leg including right ankle.  Usually able to do 1-2 laps with a walker in his house and also has a wheelchair.  Lives by himself and has aide helping him.  Patient denied any chest pain, shortness of breath, dyspnea, dizziness, lightheadedness or syncope.  Denied any fever, chills or rigor.  Denies any urinary urgency, frequency or dysuria.  Denies any nausea, vomiting or abdominal pain.   In the ED, patient had stable vitals.  CBC showed a WBC elevated at 10.6.  BMP with creatinine at 0.8.  Imaging in the ED showed displaced angulated and comminuted fracture of the distal right femur.  Orthopedics was consulted from the ED and patient was considered for admission to the hospital for further evaluation and likely surgical treatment.    Right distal femur displaced and comminuted fracture.  Orthopedic has seen the patient and patient will be n.p.o. after midnight.  Plan for surgical intervention in AM.  Continue fracture protocols, immobilization, pain management.  History of chronic heart failure.  Currently compensated.  Takes torsemide 20 mg daily, digoxin and Farxiga at home.  Continue metoprolol.  Hold off with torsemide and [place].  Continue digoxin.  Check digoxin level.Marland Kitchen  GERD We will put the patient on Protonix.  Hyperlipidemia Not on statins.  Hypertension Will continue to monitor.  Patient takes benazepril metoprolol at home.  Hypothyroidism.  On Synthroid at home.  Check TSH.  History of polymyalgia rheumatica.  Patient might need rehabilitation on discharge.  Has a home health aide and  walker or wheelchair at home.

## 2023-05-23 NOTE — ED Notes (Signed)
 ED TO INPATIENT HANDOFF REPORT  Name/Age/Gender Darin [name] 89 y.o. male  Code Status    Code Status Orders  (From admission, onward)           Start     Ordered   05/23/23 1720  Do not attempt resuscitation (DNR)- Limited -Do Not Intubate (DNI)  Continuous       Question Answer Comment  If pulseless and not breathing No CPR or chest compressions.   In Pre-Arrest Conditions (Patient Is Breathing and Has A Pulse) Do not intubate. Provide all appropriate non-invasive medical interventions. Avoid ICU transfer unless indicated or required.   Consent: Discussion documented in EHR or advanced directives reviewed      05/23/23 1720           Code Status History     Date Active Date Inactive Code Status Order ID Comments User Context   02/04/2023 1124 02/07/2023 1635 Limited: Do not attempt resuscitation (DNR) -DNR-LIMITED -Do Not Intubate/DNI  [id number]  Lorin Glass, MD ED   01/21/2023 2050 01/25/2023 2106 Limited: Do not attempt resuscitation (DNR) -DNR-LIMITED -Do Not Intubate/DNI  [id number]  Kathlene Cote, PA-C ED   12/03/2022 1342 12/06/2022 2221 Limited: Do not attempt resuscitation (DNR) -DNR-LIMITED -Do Not Intubate/DNI  [id number]  Orland Mustard, MD ED   12/03/2022 1313 12/03/2022 1342 Limited: Do not attempt resuscitation (DNR) -DNR-LIMITED -Do Not Intubate/DNI  [id number]  Orland Mustard, MD ED   05/20/2021 1642 05/24/2021 1808 Full Code [id number]  Bobette Mo, MD ED   05/20/2021 1642 05/20/2021 1642 Full Code [id number]  Bobette Mo, MD ED   03/12/2017 0957 03/16/2017 1644 Full Code [id number]  Lorretta Harp, MD ED   03/29/2012 1956 03/31/2012 1816 Full Code [id number]  Krystal Eaton, RN Inpatient       Home/SNF/Other Home  Chief Complaint Femur fracture [facility]) [S72.90XA]  Level of Care/Admitting Diagnosis ED Disposition     ED Disposition  Admit   Condition  --   Comment  Hospital Area: [facility] [[id number]]  Level of Care:  Med-Surg [16]  May admit patient to [facility] or [facility] if equivalent level of care is available:: No  Covid Evaluation: Asymptomatic - no recent exposure (last 10 days) testing not required  Diagnosis: Femur fracture [facility]) [[id number]]  Admitting Physician: Joycelyn Das [[id number]]  Attending Physician: Joycelyn Das [[id number]]  Certification:: I certify this patient will need inpatient services for at least 2 midnights  Expected Medical Readiness: 05/26/2023          Medical History Past Medical History:  Diagnosis Date   Arthritis    Chronic HFrEF (heart failure with reduced ejection fraction) (HCC)    DUPUYTREN'S CONTRACTURE, RIGHT 07/04/2009   Annotation: 4th digit Qualifier: Diagnosis of  By: Lovell Sheehan MD, John E    GERD (gastroesophageal reflux disease)    H/O hiatal hernia    "FOR YEARS" " NO PROBLEM"   Hyperlipidemia    Hypertension    Knee pain    Neuropathy    PERIPHERAL    Osteoporosis    PMR (polymyalgia rheumatica) (HCC)    Polymyalgia (HCC) 5 YEARS    Allergies Allergies  Allergen Reactions   Statins Other (See Comments)    Muscle pain, upset stomach, and leg pain    IV Location/Drains/Wounds Patient Lines/Drains/Airways Status     Active Line/Drains/Airways     Name Placement date Placement time Site Days   Peripheral IV 05/23/23 20 G Anterior;Proximal;Right Forearm 05/23/23  1538  Forearm  less than 1            Labs/Imaging Results for orders placed or performed during the hospital encounter of 05/23/23 (from the past 48 hours)  Basic metabolic panel     Status: Abnormal   Collection Time: 05/23/23  3:42 PM  Result Value Ref Range   Sodium 136 135 - 145 mmol/L   Potassium 3.7 3.5 - 5.1 mmol/L   Chloride 99 98 - 111 mmol/L   CO2 26 22 - 32 mmol/L   Glucose, Bld 117 (H) 70 - 99 mg/dL    Comment: Glucose reference range applies only to samples taken after fasting for at least 8 hours.   BUN 22 8 - 23 mg/dL   Creatinine, Ser 1.61 0.61 -  1.24 mg/dL   Calcium 9.6 8.9 - 09.6 mg/dL   GFR, Estimated >04 >54 mL/min    Comment: (NOTE) Calculated using the CKD-EPI Creatinine Equation (2021)    Anion gap 11 5 - 15    Comment: Performed at [facility], [address], [place], [place] [address]  CBC     Status: Abnormal   Collection Time: 05/23/23  3:42 PM  Result Value Ref Range   WBC 10.6 (H) 4.0 - 10.5 K/uL   RBC 4.56 4.22 - 5.81 MIL/uL   Hemoglobin 14.3 13.0 - 17.0 g/dL   HCT 91.4 78.2 - 95.6 %   MCV 95.6 80.0 - 100.0 fL   MCH 31.4 26.0 - 34.0 pg   MCHC 32.8 30.0 - 36.0 g/dL   RDW 21.3 08.6 - 57.8 %   Platelets 195 150 - 400 K/uL   nRBC 0.0 0.0 - 0.2 %    Comment: Performed at [facility], [address], [place], [place] [address]   CT FEMUR RIGHT WO CONTRAST Result Date: 05/23/2023 CLINICAL DATA:  Upper leg trauma.  Distal femur fracture. EXAM: CT OF THE LOWER RIGHT EXTREMITY WITHOUT CONTRAST TECHNIQUE: Multidetector CT imaging of the right 5 was performed according to the standard protocol. RADIATION DOSE REDUCTION: This exam was performed according to the departmental dose-optimization program which includes automated exposure control, adjustment of the mA and/or kV according to patient size and/or use of iterative reconstruction technique. COMPARISON:  Radiographs same date. FINDINGS: Bones/Joint/Cartilage Examination includes the entire right thigh. There is some motion on the images through the distal femur. Additional imaging (in a separate acquisition) of the knee was obtained. There is a comminuted and displaced fracture of the distal right femoral metadiaphysis. There is an associated 7.4 cm butterfly fragment anteriorly. There is posteromedial displacement of the distal fragment. This fracture demonstrates no definite intercondylar extension or involvement of the articular surface of the distal femur. There are advanced underlying tricompartmental degenerative changes at the  right knee with associated joint space narrowing and osteophytes. Patient is status post proximal tibial intramedullary nail fixation. There are old healed fractures of the proximal tibia and fibula. Small knee joint effusion without definite lipohemarthrosis. No proximal femoral injury identified. Mild right hip degenerative changes without significant hip joint effusion. Ligaments Suboptimally assessed by CT. Muscles and Tendons The extensor mechanism at the knee is intact. There is fragmented spurring at the quadriceps insertion on the patella. No intramuscular hematoma or focal atrophy identified. Soft tissues There is ill-defined soft tissue hemorrhage posteriorly in the distal right thigh at the level of the comminuted fracture. No focal hematoma, foreign body or soft tissue emphysema identified. Diffuse femoropopliteal atherosclerosis. Right inguinal hernia  containing bowel noted incidentally. IMPRESSION: 1. Comminuted and displaced fracture of the distal right femoral metadiaphysis with associated 7.4 cm butterfly fragment anteriorly. No definite intercondylar extension or involvement of the articular surface of the distal femur. 2. Advanced tricompartmental degenerative changes at the right knee. 3. Old healed fractures of the proximal tibia and fibula status post proximal tibial intramedullary nail fixation. 4. Ill-defined soft tissue hemorrhage posteriorly in the distal right thigh. 5. Right inguinal hernia containing bowel noted incidentally. Electronically Signed   By: Carey Bullocks M.D.   On: 05/23/2023 16:37   DG Tibia/Fibula Right Result Date: 05/23/2023 CLINICAL DATA:  Fall.  Right lower extremity pain. EXAM: RIGHT TIBIA AND FIBULA - 2 VIEW; RIGHT FEMUR 2 VIEWS; RIGHT ANKLE - COMPLETE 3+ VIEW COMPARISON:  02/22/2015. FINDINGS: There is diffuse osteopenia of the visualized osseous structures. There is displaced, angulated and comminuted fracture of the distal right femur. No intra-articular  extension seen. There is old healed fracture of the proximal right tibia, status post intramedullary nail placement. No other acute fracture or dislocation. No aggressive osseous lesion. Mild-to-moderate degenerative changes of the knee joint. No significant degenerative changes of the right hip joint and ankle joint. Ankle mortise appears intact. No focal soft tissue swelling. No radiopaque foreign bodies. IMPRESSION: *Displaced, angulated and comminuted fracture of the distal right femur. No intra-articular extension. Electronically Signed   By: Jules Schick M.D.   On: 05/23/2023 13:11   DG Ankle Complete Right Result Date: 05/23/2023 CLINICAL DATA:  Fall.  Right lower extremity pain. EXAM: RIGHT TIBIA AND FIBULA - 2 VIEW; RIGHT FEMUR 2 VIEWS; RIGHT ANKLE - COMPLETE 3+ VIEW COMPARISON:  02/22/2015. FINDINGS: There is diffuse osteopenia of the visualized osseous structures. There is displaced, angulated and comminuted fracture of the distal right femur. No intra-articular extension seen. There is old healed fracture of the proximal right tibia, status post intramedullary nail placement. No other acute fracture or dislocation. No aggressive osseous lesion. Mild-to-moderate degenerative changes of the knee joint. No significant degenerative changes of the right hip joint and ankle joint. Ankle mortise appears intact. No focal soft tissue swelling. No radiopaque foreign bodies. IMPRESSION: *Displaced, angulated and comminuted fracture of the distal right femur. No intra-articular extension. Electronically Signed   By: Jules Schick M.D.   On: 05/23/2023 13:11   DG FEMUR, MIN 2 VIEWS RIGHT Result Date: 05/23/2023 CLINICAL DATA:  Fall.  Right lower extremity pain. EXAM: RIGHT TIBIA AND FIBULA - 2 VIEW; RIGHT FEMUR 2 VIEWS; RIGHT ANKLE - COMPLETE 3+ VIEW COMPARISON:  02/22/2015. FINDINGS: There is diffuse osteopenia of the visualized osseous structures. There is displaced, angulated and comminuted fracture of  the distal right femur. No intra-articular extension seen. There is old healed fracture of the proximal right tibia, status post intramedullary nail placement. No other acute fracture or dislocation. No aggressive osseous lesion. Mild-to-moderate degenerative changes of the knee joint. No significant degenerative changes of the right hip joint and ankle joint. Ankle mortise appears intact. No focal soft tissue swelling. No radiopaque foreign bodies. IMPRESSION: *Displaced, angulated and comminuted fracture of the distal right femur. No intra-articular extension. Electronically Signed   By: Jules Schick M.D.   On: 05/23/2023 13:11    Pending Labs Unresulted Labs (From admission, onward)     Start     Ordered   05/24/23 0500  CBC  Tomorrow morning,   R        05/23/23 1721   05/24/23 0500  Basic metabolic panel  Tomorrow  morning,   R        05/23/23 1721   05/24/23 0500  Magnesium  Tomorrow morning,   R        05/23/23 1721   05/23/23 1737  TSH  Add-on,   AD        05/23/23 1736   05/23/23 1736  Digoxin level  Add-on,   AD        05/23/23 1735            Vitals/Pain Today's Vitals   05/23/23 1845 05/23/23 2006 05/23/23 2115 05/23/23 2150  BP:    114/61  Pulse:    82  Resp:    20  Temp: 98.9 F (37.2 C)   98.2 F (36.8 C)  TempSrc: Oral   Bladder  SpO2:    97%  Weight:      Height:      PainSc:  8  5  5      Isolation Precautions No active isolations  Medications Medications  acetaminophen (TYLENOL) tablet 650 mg (650 mg Oral Given 05/23/23 1554)  famotidine (PEPCID) tablet 20 mg (20 mg Oral Given 05/23/23 2115)  polyethylene glycol (MIRALAX / GLYCOLAX) packet 17 g (has no administration in time range)  finasteride (PROSCAR) tablet 5 mg (5 mg Oral Given 05/23/23 2116)  magnesium oxide (MAG-OX) tablet 400 mg (400 mg Oral Given 05/23/23 2116)  triamcinolone cream (KENALOG) 0.5 % (has no administration in time range)  HYDROcodone-acetaminophen (NORCO/VICODIN) 5-325 MG per  tablet 1-2 tablet (2 tablets Oral Given 05/23/23 2115)  morphine (PF) 2 MG/ML injection 0.5 mg (0.5 mg Intravenous Given 05/23/23 2006)  methocarbamol (ROBAXIN) tablet 500 mg (has no administration in time range)    Or  methocarbamol (ROBAXIN) injection 500 mg (has no administration in time range)  senna-docusate (Senokot-S) tablet 1 tablet (1 tablet Oral Given 05/23/23 2116)  ezetimibe (ZETIA) tablet 10 mg (has no administration in time range)  levothyroxine (SYNTHROID) tablet 75 mcg (has no administration in time range)  metoprolol succinate (TOPROL-XL) 24 hr tablet 25 mg (has no administration in time range)  gabapentin (NEURONTIN) capsule 100 mg (100 mg Oral Given 05/23/23 2115)  ketorolac (TORADOL) 30 MG/ML injection 30 mg (30 mg Intravenous Given 05/23/23 1552)    Mobility non-ambulatory

## 2023-05-23 NOTE — Consult Note (Signed)
 ORTHOPAEDIC CONSULTATION  REQUESTING PHYSICIAN: Trifan, Kermit Balo, MD  Chief Complaint: right leg pain  HPI: Darin [name] is a 89 y.o. male who complains of acute right leg and right ankle pain.  Uses wheelchair at home and usually able to do 1-2 laps around his living area at home per day.  Was on his second lap when he tripped and gently fell, which caused his right leg to bend back under him.  Patient felt instant pain and noticed difficulty bearing weight on his right leg.  No significant swelling.  No other complaints.  Some decreased sensation of bilateral feet at baseline.  Lives alone, has help with [facility] aide.  On plavix.  Past Medical History:  Diagnosis Date   Arthritis    Chronic HFrEF (heart failure with reduced ejection fraction) (HCC)    DUPUYTREN'S CONTRACTURE, RIGHT 07/04/2009   Annotation: 4th digit Qualifier: Diagnosis of  By: Lovell Sheehan MD, Balinda Quails    GERD (gastroesophageal reflux disease)    H/O hiatal hernia    "FOR YEARS" " NO PROBLEM"   Hyperlipidemia    Hypertension    Knee pain    Neuropathy    PERIPHERAL    Osteoporosis    PMR (polymyalgia rheumatica) (HCC)    Polymyalgia (HCC) 5 YEARS   Past Surgical History:  Procedure Laterality Date   CARPAL TUNNEL RELEASE     CATARACT EXTRACTION     right   COLONOSCOPY  2007   INGUINAL HERNIA REPAIR  10/14/2011   Procedure: HERNIA REPAIR INGUINAL ADULT;  Surgeon: Shelly Rubenstein, MD;  Location: [facility] OR;  Service: General;  Laterality: Right;  Right Inguinal Hernia Repair with Mesh   TIBIA IM NAIL INSERTION Right 05/22/2021   Procedure: INTRAMEDULLARY (IM) NAIL TIBIAL;  Surgeon: Myrene Galas, MD;  Location: [facility] OR;  Service: Orthopedics;  Laterality: Right;   TONSILLECTOMY     VARICOSE VEIN SURGERY     Social History   Socioeconomic History   Marital status: Widowed    Spouse name: Not on file   Number of children: Not on file   Years of education: Not on file   Highest education level: Master's degree (e.g.,  MA, MS, MEng, MEd, MSW, MBA)  Occupational History   Occupation: retired    Associate Professor: RETIRED  Tobacco Use   Smoking status: Never   Smokeless tobacco: Never  Vaping Use   Vaping status: Never Used  Substance and Sexual Activity   Alcohol use: Not Currently    Alcohol/week: 3.0 standard drinks of alcohol    Types: 3 Glasses of wine per week    Comment: half glass wine before dinner   Drug use: No   Sexual activity: Not Currently  Other Topics Concern   Not on file  Social History Narrative   Not on file   Social Drivers of Health   Financial Resource Strain: Low Risk  (08/30/2022)   Overall Financial Resource Strain (CARDIA)    Difficulty of Paying Living Expenses: Not hard at all  Food Insecurity: No Food Insecurity (02/04/2023)   Hunger Vital Sign    Worried About Running Out of Food in the Last Year: Never true    Ran Out of Food in the Last Year: Never true  Transportation Needs: No Transportation Needs (02/04/2023)   PRAPARE - Administrator, Civil Service (Medical): No    Lack of Transportation (Non-Medical): No  Physical Activity: Sufficiently Active (08/30/2022)   Exercise Vital Sign    Days  of Exercise per Week: 7 days    Minutes of Exercise per Session: 40 min  Stress: No Stress Concern Present (08/30/2022)   [employer] of Occupational Health - Occupational Stress Questionnaire    Feeling of Stress : Not at all  Social Connections: Moderately Integrated (08/30/2022)   Social Connection and Isolation Panel [NHANES]    Frequency of Communication with Friends and Family: More than three times a week    Frequency of Social Gatherings with Friends and Family: More than three times a week    Attends Religious Services: More than 4 times per year    Active Member of [employer] or Organizations: Yes    Attends Banker Meetings: More than 4 times per year    Marital Status: Widowed   Family History  Problem Relation Age of Onset   Heart disease  Father    Coronary artery disease Other    Allergies  Allergen Reactions   Statins Other (See Comments)    Muscle pain, upset stomach, and leg pain     Positive ROS: All other systems have been reviewed and were otherwise negative with the exception of those mentioned in the HPI and as above.  Physical Exam: General: Alert, no acute distress Cardiovascular: No pedal edema Respiratory: No cyanosis, no use of accessory musculature Skin: No lesions in the area of chief complaint Neurologic: Sensation intact distally Psychiatric: Patient is competent for consent with normal mood and affect  MUSCULOSKELETAL:  RLE Visual exam limited due to long leg splint.  Splint bandages appear intact.   Right lower leg tender to palpation  ROM deferred due to known fracture  No ankle effusion  Wiggles toes appropriately  Tolerates gentle dorsiflexion and plantarflexion  Mild decreased sensation of bilateral feet, otherwise right lower extremity appears grossly neurologically intact  DP 1+, PT 1+, CPR < 2.  No significant edema, see photo below   IMAGING: x-ray of right femur shows displaced, angulated, comminuted fracture of right distal femur.  X-rays of tibia/fibula and ankle are unremarkable.  Assessment: Active Problems:   * No active hospital problems. *   Acute distal right femur fracture, closed, displaced  Plan: Discussed case with attending Dr. Blanchie Dessert.  Patient unfortunately has sustained a distal femur fracture.  At this point surgical intervention is recommended to fixate and stabilize the fracture via open reduction internal fixation.   The risks benefits and alternatives were discussed with the patient including but not limited to the risks of nonoperative treatment, versus surgical intervention including infection, bleeding, nerve injury, malunion, nonunion, the need for revision surgery, hardware prominence, hardware failure, the need for hardware removal, blood clots,  cardiopulmonary complications, morbidity, mortality, among others, and they were willing to proceed.     Plan for surgery tomorrow pending OR availability.  Hold plavix starting today.  Long leg splint in place, CT of right femur pending for surgical planning.  Non-weight bearing for the time being.  NPO past midnight.    Cecil Cobbs, PA-C  Contact information:   [phone] 7am-5pm epic message Dr. Blanchie Dessert, or call office for patient follow up: [phone] After hours and holidays please check [email or web address] for group call information for [facility]

## 2023-05-23 NOTE — ED Notes (Addendum)
 Called Orthopaedic Trauma Specialist at 1349.  Talked with Efraim Kaufmann and she said that she will call Doctor on call  [phone].

## 2023-05-23 NOTE — ED Triage Notes (Signed)
 Pt arrived via POV. C/o R LLE pain from tripping while walking. No head trauma, no LOC. Not on blood thinners. Previous surgery to LLE

## 2023-05-23 NOTE — Progress Notes (Signed)
 Orthopedic Tech Progress Note Patient Details:  Alison Breeding Mar 24, 1932 [id number]  Ortho Devices Type of Ortho Device: Long leg splint Ortho Device/Splint Location: RLE Ortho Device/Splint Interventions: Application   Post Interventions Patient Tolerated: Lenna Sciara Keisi Eckford 05/23/2023, 2:59 PM

## 2023-05-24 ENCOUNTER — Encounter (HOSPITAL_COMMUNITY): Payer: Self-pay | Admitting: Internal Medicine

## 2023-05-24 ENCOUNTER — Inpatient Hospital Stay (HOSPITAL_COMMUNITY)

## 2023-05-24 ENCOUNTER — Encounter (HOSPITAL_COMMUNITY)
Admission: EM | Disposition: A | Discharge status: Skilled Nursing Facility | Payer: Self-pay | Source: Home / Self Care | Attending: Family Medicine

## 2023-05-24 ENCOUNTER — Other Ambulatory Visit: Payer: Self-pay

## 2023-05-24 ENCOUNTER — Inpatient Hospital Stay (HOSPITAL_COMMUNITY): Payer: Self-pay | Admitting: Anesthesiology

## 2023-05-24 DIAGNOSIS — Y92009 Unspecified place in unspecified non-institutional (private) residence as the place of occurrence of the external cause: Secondary | ICD-10-CM

## 2023-05-24 DIAGNOSIS — E039 Hypothyroidism, unspecified: Secondary | ICD-10-CM

## 2023-05-24 DIAGNOSIS — I10 Essential (primary) hypertension: Secondary | ICD-10-CM | POA: Diagnosis not present

## 2023-05-24 DIAGNOSIS — S72401A Unspecified fracture of lower end of right femur, initial encounter for closed fracture: Secondary | ICD-10-CM

## 2023-05-24 DIAGNOSIS — S79101A Unspecified physeal fracture of lower end of right femur, initial encounter for closed fracture: Secondary | ICD-10-CM | POA: Diagnosis not present

## 2023-05-24 DIAGNOSIS — S72451A Displaced supracondylar fracture without intracondylar extension of lower end of right femur, initial encounter for closed fracture: Secondary | ICD-10-CM | POA: Diagnosis not present

## 2023-05-24 DIAGNOSIS — K219 Gastro-esophageal reflux disease without esophagitis: Secondary | ICD-10-CM

## 2023-05-24 DIAGNOSIS — W19XXXA Unspecified fall, initial encounter: Secondary | ICD-10-CM

## 2023-05-24 DIAGNOSIS — I509 Heart failure, unspecified: Secondary | ICD-10-CM

## 2023-05-24 DIAGNOSIS — I110 Hypertensive heart disease with heart failure: Secondary | ICD-10-CM | POA: Diagnosis not present

## 2023-05-24 DIAGNOSIS — I5022 Chronic systolic (congestive) heart failure: Secondary | ICD-10-CM | POA: Diagnosis not present

## 2023-05-24 HISTORY — PX: ORIF FEMUR FRACTURE: SHX2119

## 2023-05-24 LAB — CBC
HCT: 44.1 % (ref 39.0–52.0)
Hemoglobin: 13.8 g/dL (ref 13.0–17.0)
MCH: 31.2 pg (ref 26.0–34.0)
MCHC: 31.3 g/dL (ref 30.0–36.0)
MCV: 99.5 fL (ref 80.0–100.0)
Platelets: 185 10*3/uL (ref 150–400)
RBC: 4.43 MIL/uL (ref 4.22–5.81)
RDW: 13.8 % (ref 11.5–15.5)
WBC: 7.1 10*3/uL (ref 4.0–10.5)
nRBC: 0 % (ref 0.0–0.2)

## 2023-05-24 LAB — DIGOXIN LEVEL: Digoxin Level: 0.5 ng/mL — ABNORMAL LOW (ref 0.8–2.0)

## 2023-05-24 LAB — SURGICAL PCR SCREEN
MRSA, PCR: NEGATIVE
Staphylococcus aureus: NEGATIVE

## 2023-05-24 LAB — BASIC METABOLIC PANEL
Anion gap: 9 (ref 5–15)
BUN: 27 mg/dL — ABNORMAL HIGH (ref 8–23)
CO2: 30 mmol/L (ref 22–32)
Calcium: 9.8 mg/dL (ref 8.9–10.3)
Chloride: 99 mmol/L (ref 98–111)
Creatinine, Ser: 1.12 mg/dL (ref 0.61–1.24)
GFR, Estimated: >60 mL/min (ref >60)
Glucose, Bld: 109 mg/dL — ABNORMAL HIGH (ref 70–99)
Potassium: 4.1 mmol/L (ref 3.5–5.1)
Sodium: 138 mmol/L (ref 135–145)

## 2023-05-24 LAB — GLUCOSE, CAPILLARY: Glucose-Capillary: 135 mg/dL — ABNORMAL HIGH (ref 70–99)

## 2023-05-24 LAB — TSH: TSH: 13.227 u[IU]/mL — ABNORMAL HIGH (ref 0.350–4.500)

## 2023-05-24 LAB — MAGNESIUM: Magnesium: 2.6 mg/dL — ABNORMAL HIGH (ref 1.7–2.4)

## 2023-05-24 SURGERY — OPEN REDUCTION INTERNAL FIXATION (ORIF) DISTAL FEMUR FRACTURE
Anesthesia: General | Site: Thigh | Laterality: Right

## 2023-05-24 MED ORDER — STERILE WATER FOR IRRIGATION IR SOLN
Status: DC | PRN
  Administered 2023-05-24: 1000 mL

## 2023-05-24 MED ORDER — FENTANYL CITRATE (PF) 100 MCG/2ML IJ SOLN
INTRAMUSCULAR | Status: AC
  Filled 2023-05-24: qty 2

## 2023-05-24 MED ORDER — PHENYLEPHRINE 80 MCG/ML (10ML) SYRINGE FOR IV PUSH (FOR BLOOD PRESSURE SUPPORT)
PREFILLED_SYRINGE | INTRAVENOUS | Status: DC | PRN
  Administered 2023-05-24: 80 ug via INTRAVENOUS
  Administered 2023-05-24: 160 ug via INTRAVENOUS

## 2023-05-24 MED ORDER — DEXAMETHASONE SODIUM PHOSPHATE 10 MG/ML IJ SOLN
INTRAMUSCULAR | Status: DC | PRN
  Administered 2023-05-24: 4 mg via INTRAVENOUS

## 2023-05-24 MED ORDER — ACETAMINOPHEN 10 MG/ML IV SOLN
INTRAVENOUS | Status: AC
  Filled 2023-05-24: qty 100

## 2023-05-24 MED ORDER — CEFAZOLIN SODIUM-DEXTROSE 2-4 GM/100ML-% IV SOLN
2.0000 g | INTRAVENOUS | Status: AC
  Administered 2023-05-24: 2 g via INTRAVENOUS

## 2023-05-24 MED ORDER — PROPOFOL 10 MG/ML IV BOLUS
INTRAVENOUS | Status: AC
  Filled 2023-05-24: qty 20

## 2023-05-24 MED ORDER — PHENOL 1.4 % MT LIQD
1.0000 | OROMUCOSAL | Status: DC | PRN
  Administered 2023-05-24 – 2023-05-25 (×2): 1 via OROMUCOSAL
  Filled 2023-05-24: qty 177

## 2023-05-24 MED ORDER — ESMOLOL HCL 100 MG/10ML IV SOLN
INTRAVENOUS | Status: DC | PRN
Start: 2023-05-24 — End: 2023-05-24
  Administered 2023-05-24: 10 mg via INTRAVENOUS

## 2023-05-24 MED ORDER — FENTANYL CITRATE PF 50 MCG/ML IJ SOSY: 25.0000 ug | PREFILLED_SYRINGE | INTRAMUSCULAR | Status: DC | PRN

## 2023-05-24 MED ORDER — CEFAZOLIN SODIUM-DEXTROSE 2-4 GM/100ML-% IV SOLN
INTRAVENOUS | Status: AC
Start: 2023-05-24 — End: 2023-05-24
  Filled 2023-05-24: qty 100

## 2023-05-24 MED ORDER — LIDOCAINE HCL (CARDIAC) PF 100 MG/5ML IV SOSY
PREFILLED_SYRINGE | INTRAVENOUS | Status: DC | PRN
  Administered 2023-05-24: 60 mg via INTRAVENOUS

## 2023-05-24 MED ORDER — ENOXAPARIN SODIUM 40 MG/0.4ML IJ SOSY
40.0000 mg | PREFILLED_SYRINGE | INTRAMUSCULAR | Status: DC
  Administered 2023-05-25 – 2023-05-28 (×4): 40 mg via SUBCUTANEOUS
  Filled 2023-05-24 (×4): qty 0.4

## 2023-05-24 MED ORDER — PROPOFOL 10 MG/ML IV BOLUS
INTRAVENOUS | Status: DC | PRN
  Administered 2023-05-24: 60 mg via INTRAVENOUS

## 2023-05-24 MED ORDER — ASPIRIN 81 MG PO TBEC
81.0000 mg | DELAYED_RELEASE_TABLET | Freq: Two times a day (BID) | ORAL | Status: DC
Start: 2023-05-25 — End: 2023-05-25

## 2023-05-24 MED ORDER — TRANEXAMIC ACID-NACL 1000-0.7 MG/100ML-% IV SOLN
INTRAVENOUS | Status: AC
  Filled 2023-05-24: qty 100

## 2023-05-24 MED ORDER — SUGAMMADEX SODIUM 200 MG/2ML IV SOLN
INTRAVENOUS | Status: DC | PRN
  Administered 2023-05-24: 200 mg via INTRAVENOUS

## 2023-05-24 MED ORDER — ONDANSETRON HCL 4 MG/2ML IJ SOLN
INTRAMUSCULAR | Status: AC
  Filled 2023-05-24: qty 2

## 2023-05-24 MED ORDER — TRANEXAMIC ACID-NACL 1000-0.7 MG/100ML-% IV SOLN
1000.0000 mg | INTRAVENOUS | Status: AC
  Administered 2023-05-24: 1000 mg via INTRAVENOUS

## 2023-05-24 MED ORDER — VANCOMYCIN HCL 1000 MG IV SOLR
INTRAVENOUS | Status: AC
  Filled 2023-05-24: qty 20

## 2023-05-24 MED ORDER — LACTATED RINGERS IV SOLN: INTRAVENOUS | Status: DC | PRN

## 2023-05-24 MED ORDER — PROPOFOL 1000 MG/100ML IV EMUL
INTRAVENOUS | Status: AC
  Filled 2023-05-24: qty 100

## 2023-05-24 MED ORDER — DEXAMETHASONE SODIUM PHOSPHATE 10 MG/ML IJ SOLN
INTRAMUSCULAR | Status: AC
  Filled 2023-05-24: qty 1

## 2023-05-24 MED ORDER — ESMOLOL HCL 100 MG/10ML IV SOLN
INTRAVENOUS | Status: AC
  Filled 2023-05-24: qty 10

## 2023-05-24 MED ORDER — CHLORHEXIDINE GLUCONATE 4 % EX SOLN: 60.0000 mL | Freq: Once | CUTANEOUS | Status: DC

## 2023-05-24 MED ORDER — POVIDONE-IODINE 10 % EX SWAB: 2.0000 | Freq: Once | CUTANEOUS | Status: DC

## 2023-05-24 MED ORDER — PROPOFOL 10 MG/ML IV BOLUS
INTRAVENOUS | Status: AC
Start: 2023-05-24 — End: ?
  Filled 2023-05-24: qty 20

## 2023-05-24 MED ORDER — 0.9 % SODIUM CHLORIDE (POUR BTL) OPTIME
TOPICAL | Status: DC | PRN
  Administered 2023-05-24: 1000 mL

## 2023-05-24 MED ORDER — VANCOMYCIN HCL 1000 MG IV SOLR
INTRAVENOUS | Status: DC | PRN
  Administered 2023-05-24: 1000 mg via TOPICAL

## 2023-05-24 MED ORDER — OXYCODONE HCL 5 MG PO TABS
2.5000 mg | ORAL_TABLET | ORAL | Status: DC | PRN
  Filled 2023-05-24: qty 1

## 2023-05-24 MED ORDER — CEFAZOLIN SODIUM-DEXTROSE 2-4 GM/100ML-% IV SOLN
2.0000 g | Freq: Three times a day (TID) | INTRAVENOUS | Status: AC
Start: 2023-05-24 — End: 2023-05-25
  Administered 2023-05-24 (×2): 2 g via INTRAVENOUS
  Filled 2023-05-24 (×2): qty 100

## 2023-05-24 MED ORDER — BUPIVACAINE HCL (PF) 0.25 % IJ SOLN
INTRAMUSCULAR | Status: AC
  Filled 2023-05-24: qty 30

## 2023-05-24 MED ORDER — FENTANYL CITRATE (PF) 100 MCG/2ML IJ SOLN
INTRAMUSCULAR | Status: DC | PRN
  Administered 2023-05-24 (×2): 25 ug via INTRAVENOUS
  Administered 2023-05-24: 100 ug via INTRAVENOUS
  Administered 2023-05-24: 50 ug via INTRAVENOUS

## 2023-05-24 MED ORDER — LIDOCAINE HCL (PF) 2 % IJ SOLN
INTRAMUSCULAR | Status: AC
  Filled 2023-05-24: qty 5

## 2023-05-24 MED ORDER — SUGAMMADEX SODIUM 200 MG/2ML IV SOLN
INTRAVENOUS | Status: AC
  Filled 2023-05-24: qty 2

## 2023-05-24 MED ORDER — ROCURONIUM BROMIDE 10 MG/ML (PF) SYRINGE
PREFILLED_SYRINGE | INTRAVENOUS | Status: DC | PRN
  Administered 2023-05-24: 10 mg via INTRAVENOUS
  Administered 2023-05-24: 60 mg via INTRAVENOUS
  Administered 2023-05-24: 10 mg via INTRAVENOUS

## 2023-05-24 MED ORDER — ACETAMINOPHEN 10 MG/ML IV SOLN
INTRAVENOUS | Status: DC | PRN
Start: 2023-05-24 — End: 2023-05-24
  Administered 2023-05-24: 1000 mg via INTRAVENOUS

## 2023-05-24 MED ORDER — BUPIVACAINE HCL (PF) 0.25 % IJ SOLN
INTRAMUSCULAR | Status: DC | PRN
  Administered 2023-05-24: 20 mL

## 2023-05-24 MED ORDER — PROPOFOL 500 MG/50ML IV EMUL
INTRAVENOUS | Status: DC | PRN
  Administered 2023-05-24: 75 ug/kg/min via INTRAVENOUS

## 2023-05-24 MED ORDER — ONDANSETRON HCL 4 MG/2ML IJ SOLN
INTRAMUSCULAR | Status: DC | PRN
  Administered 2023-05-24: 4 mg via INTRAVENOUS

## 2023-05-24 SURGICAL SUPPLY — 57 items
BAG COUNTER SPONGE SURGICOUNT (BAG) ×2 IMPLANT
BIT DRILL 4.3X300MM (BIT) IMPLANT
BIT DRILL LONG 3.3 (BIT) IMPLANT
BIT DRILL QC 3.3X195 (BIT) IMPLANT
BNDG ELASTIC 6INX 5YD STR LF (GAUZE/BANDAGES/DRESSINGS) IMPLANT
BNDG ELASTIC 6X10 VLCR STRL LF (GAUZE/BANDAGES/DRESSINGS) IMPLANT
CAP LOCK NCB (Cap) IMPLANT
CHLORAPREP W/TINT 26 (MISCELLANEOUS) ×2 IMPLANT
DERMABOND ADVANCED .7 DNX12 (GAUZE/BANDAGES/DRESSINGS) ×2 IMPLANT
DRAPE 3/4 80X56 (DRAPES) IMPLANT
DRAPE C-ARM 42X120 X-RAY (DRAPES) ×2 IMPLANT
DRAPE C-ARMOR (DRAPES) IMPLANT
DRAPE IMP U-DRAPE 54X76 (DRAPES) ×2 IMPLANT
DRAPE SURG ORHT 6 SPLT 77X108 (DRAPES) ×4 IMPLANT
DRAPE U-SHAPE 47X51 STRL (DRAPES) ×2 IMPLANT
DRSG ADAPTIC 3X8 NADH LF (GAUZE/BANDAGES/DRESSINGS) IMPLANT
DRSG MEPILEX POST OP 4X12 (GAUZE/BANDAGES/DRESSINGS) ×2 IMPLANT
DRSG MEPILEX POST OP 4X8 (GAUZE/BANDAGES/DRESSINGS) IMPLANT
ELECT REM PT RETURN 15FT ADLT (MISCELLANEOUS) ×2 IMPLANT
GAUZE PAD ABD 8X10 STRL (GAUZE/BANDAGES/DRESSINGS) ×4 IMPLANT
GAUZE SPONGE 4X4 12PLY STRL (GAUZE/BANDAGES/DRESSINGS) ×2 IMPLANT
GLOVE BIO SURGEON STRL SZ7.5 (GLOVE) ×2 IMPLANT
GLOVE BIOGEL PI IND STRL 7.0 (GLOVE) IMPLANT
GLOVE BIOGEL PI IND STRL 7.5 (GLOVE) ×2 IMPLANT
GLOVE BIOGEL PI IND STRL 8 (GLOVE) ×2 IMPLANT
GLOVE PI ORTHO PRO STRL SZ8 (GLOVE) ×2 IMPLANT
GOWN STRL REUS W/ TWL LRG LVL3 (GOWN DISPOSABLE) ×2 IMPLANT
GOWN STRL REUS W/ TWL XL LVL3 (GOWN DISPOSABLE) ×2 IMPLANT
HOOD PEEL AWAY T7 (MISCELLANEOUS) ×4 IMPLANT
K-WIRE FXSTD 280X2XNS SS (WIRE) ×2 IMPLANT
KIT BASIN OR (CUSTOM PROCEDURE TRAY) ×2 IMPLANT
KIT TURNOVER KIT A (KITS) IMPLANT
KWIRE FXSTD 280X2XNS SS (WIRE) IMPLANT
MANIFOLD NEPTUNE II (INSTRUMENTS) ×2 IMPLANT
NDL HYPO 22X1.5 SAFETY MO (MISCELLANEOUS) IMPLANT
NEEDLE HYPO 22X1.5 SAFETY MO (MISCELLANEOUS) ×1 IMPLANT
NS IRRIG 1000ML POUR BTL (IV SOLUTION) ×2 IMPLANT
PACK TOTAL JOINT (CUSTOM PROCEDURE TRAY) ×2 IMPLANT
PAD ARMBOARD 7.5X6 YLW CONV (MISCELLANEOUS) ×4 IMPLANT
PLATE FEM DIST NCB PP 278MM (Plate) IMPLANT
SCREW NCB 4.0MX38M (Screw) IMPLANT
SCREW NCB 5.0X36MM (Screw) IMPLANT
SCREW NCB 5.0X38 (Screw) IMPLANT
SCREW NCB 5.0X85MM (Screw) IMPLANT
STAPLER SKIN PROX WIDE 3.9 (STAPLE) IMPLANT
SUT ETHILON 3 0 PS 1 (SUTURE) IMPLANT
SUT MNCRL AB 3-0 PS2 18 (SUTURE) IMPLANT
SUT STRATAFIX 0 PDS 27 VIOLET (SUTURE) IMPLANT
SUT STRATAFIX 14 PDO 48 VLT (SUTURE) IMPLANT
SUT STRATAFIX PDO 1 14 VIOLET (SUTURE) IMPLANT
SUT VIC AB 1 CT1 36 (SUTURE) IMPLANT
SUT VIC AB 2-0 CT2 27 (SUTURE) IMPLANT
SUTURE STRATFX 0 PDS 27 VIOLET (SUTURE) IMPLANT
SYR 20ML LL LF (SYRINGE) IMPLANT
TOWEL OR 17X26 10 PK STRL BLUE (TOWEL DISPOSABLE) ×4 IMPLANT
TRAY FOLEY MTR SLVR 16FR STAT (SET/KITS/TRAYS/PACK) IMPLANT
WATER STERILE IRR 1000ML POUR (IV SOLUTION) ×4 IMPLANT

## 2023-05-24 NOTE — Op Note (Signed)
 05/23/2023 - 05/24/2023  10:10 AM  PATIENT:  Darin [name]    PRE-OPERATIVE DIAGNOSIS: Right displaced extra-articular supracondylar distal femur fracture  POST-OPERATIVE DIAGNOSIS:  Same  PROCEDURE: Open reduction internal fixation of right supracondylar distal femur fracture with plate and screws  SURGEON:  Zettie Gootee A Sierah Lacewell, MD  PHYSICIAN ASSISTANT: Kathie Dike, PA-C, present and scrubbed throughout the case, critical for completion in a timely fashion, and for retraction, instrumentation, and closure.  ANESTHESIA:   General  PREOPERATIVE INDICATIONS:  Darin [name] is a  89 y.o. male with a diagnosis of RIGHT DISTAL FEMUR FRACTURE after a fall.  Risk and benefits of surgical intervention were discussed.  Goals of surgery to allow for early mobility and pain control.  Patient elected for surgical intervention.  The risks benefits and alternatives were discussed with the patient preoperatively including but not limited to the risks of infection, bleeding, nerve injury, cardiopulmonary complications, the need for revision surgery, among others, and the patient was willing to proceed.  ESTIMATED BLOOD LOSS: 200 cc  OPERATIVE IMPLANTS: 12 hole NCB [employer] locking plate for proximal screws, 6 distal screws  OPERATIVE PROCEDURE: The patient was identified in the preoperative holding area. Consent was confirmed with the patient and their family and all questions were answered. The operative extremity was marked after confirmation with the patient. The patient was then brought back to the operating room by our anesthesia colleagues. The patient was placed under general anesthesia and was carefully transferred over to a radiolucent flattop table. They were secured to the bed and all bony prominences were padded.  A bump was placed under the operative hip. The operative extremity was then prepped and draped in usual sterile fashion. A timeout was performed to verify the patient, the  procedure and extremity. Preoperative antibiotics were dosed.   Fluoroscopy was used to identify the fracture. A lateral approach was made to the distal femur. It was taken down through the skin and the IT band was incised in line with the incision. The distal portion of the vastus lateralis was reflected off the IT band and the distal articular block of the lateral femoral condyle was exposed.  An attempt was made to keep the fracture from being stripped or devitalized. I used fluoroscopy to identify the correct length plate to bridge the whole femoral shaft and proximal to the tip of the hip prothesis. I chose a 12-hole plate. The aiming arm was attached to the plate and slide submuscularly under the vastus to the proximal femur. The distal portion of the plate was placed flush against the lateral cortex of the distal articular block. A 3.99mm drill bit was used to position the proximal portion of the plate. A perfect lateral was obtained to show appropriate positioning of the plate.   The distal segment was drilled and 5.29mm cortical screws were placed in the distal fracture segment. A total of 3 screws were placed initially. A screw was placed percutaneously in the shaft to reduce the coronal alignment. A total of four shaft screws were placed to allow for a significant working length and micromotion of the fracture. Locking caps were placed in the shaft screws except for the most proximal screw to prevent a stress riser. The targeting guide was removed. Three more screws were placed in the distal segment. Fluoroscopy was used to confirm adequate reduction of the fracture and appropriate position of the plate and screws.   The incisions were irrigated with normal saline. A gram of vancomycin powder  was placed in the incision around the plate. The IT band was closed with 1-vicryl. The skin was closed with 2-vicryl, 3-0 nylon.  20 cc of 0.25% Marcaine were injected into the wound.  The incisions were dressed  with a Mepilex dressing and the leg was wrapped with a ACE wrap. The patient was then transferred to the regular floor bed and taken to the PACU in stable condition.  Post op recs: WB: Toe-touch weightbearing right lower extremity Abx: ancef x23 hours post op Imaging: PACU xrays Dressing: keep intact until follow up, change PRN if soiled or saturated. DVT prophylaxis: lovenox starting POD1, resume aspirin and Plavix upon discharge from the hospital Follow up: 2 weeks after surgery for a wound check with Dr. Blanchie Dessert at [facility].  Address: [address], [place], [place] [address]  Office Phone: [phone]  Weber Cooks, MD Orthopaedic Surgery

## 2023-05-24 NOTE — Plan of Care (Signed)

## 2023-05-24 NOTE — Transfer of Care (Addendum)
 Immediate Anesthesia Transfer of Care Note  Patient: Darin [name]  Procedure(s) Performed: OPEN REDUCTION INTERNAL FIXATION (ORIF) DISTAL FEMUR FRACTURE (Right: Thigh)  Patient Location: PACU  Anesthesia Type:General  Level of Consciousness: drowsy and patient cooperative  Airway & Oxygen Therapy: Patient Spontanous Breathing and Patient connected to face mask oxygen  Post-op Assessment: Report given to RN and Post -op Vital signs reviewed and stable  Post vital signs: Reviewed and stable  Last Vitals:  Vitals Value Taken Time  BP 139/64 05/24/23 1018  Temp 36.8 05/24/23   1018  Pulse 92 05/24/23 1022  Resp 13 05/24/23 1022  SpO2 99 % 05/24/23 1022  Vitals shown include unfiled device data.  Last Pain:  Vitals:   05/24/23 0652  TempSrc: Oral  PainSc:          Complications: No notable events documented.

## 2023-05-24 NOTE — Anesthesia Postprocedure Evaluation (Signed)
 Anesthesia Post Note  Patient: Darin [name]  Procedure(s) Performed: OPEN REDUCTION INTERNAL FIXATION (ORIF) DISTAL FEMUR FRACTURE (Right: Thigh)     Patient location during evaluation: PACU Anesthesia Type: General Level of consciousness: awake and alert Pain management: pain level controlled Vital Signs Assessment: post-procedure vital signs reviewed and stable Respiratory status: spontaneous breathing, nonlabored ventilation, respiratory function stable and patient connected to nasal cannula oxygen Cardiovascular status: blood pressure returned to baseline and stable Postop Assessment: no apparent nausea or vomiting Anesthetic complications: no   No notable events documented.  Last Vitals:  Vitals:   05/24/23 1100 05/24/23 1117  BP: 139/70 (!) 129/57  Pulse:  84  Resp:  15  Temp:  36.7 C  SpO2:  97%    Last Pain:  Vitals:   05/24/23 1117  TempSrc: Oral  PainSc: 0-No pain    LLE Motor Response: Purposeful movement (05/24/23 1133) LLE Sensation: Full sensation;No pain (05/24/23 1133) RLE Motor Response: Purposeful movement (05/24/23 1133) RLE Sensation: Full sensation;No pain (05/24/23 1133)      [name]

## 2023-05-24 NOTE — Interval H&P Note (Signed)
 The patient has been re-examined, and the chart reviewed, and there have been no interval changes to the documented history and physical.    Plan for ORIF Right distal femur fracture.  The operative side was examined and the patient was confirmed to have sensation to DPN, SPN, TN intact, Motor EHL, ext, flex 5/5, and DP 2+, No significant edema.   The risks, benefits, and alternatives have been discussed at length with patient, and the patient is willing to proceed.  Right femur marked. Consent has been signed.

## 2023-05-24 NOTE — Addendum Note (Signed)
 Addendum  created 05/24/23 1527 by Oletha Cruel, CRNA   Intraprocedure Meds edited

## 2023-05-24 NOTE — Discharge Instructions (Addendum)
 Orthopedic Discharge Instructions  Diet: As you were doing prior to hospitalization   Shower:  May shower but keep the wounds dry, use an occlusive plastic wrap, NO SOAKING IN TUB.  If the bandage gets wet, change with a clean dry gauze.  If you have a splint on, leave the splint in place and keep the splint dry with a plastic bag.  Dressing:  You may change your dressing 3-5 days after surgery, unless you have a splint.  If the dressing remains clean and dry it can also be left on until follow up. If you change the dressing replace with clean gauze and tape or ace wrap. If you have a splint, then just leave the splint in place and we will change your bandages during your first follow-up appointment.  If water gets in the splint or the splint gets saturated please call the clinic and we can see you to change your splint.  If you had hand or foot surgery, we will plan to remove your stitches in about 2 weeks in the office.  For all other surgeries, there are sticky tapes (steri-strips) on your wounds and all the stitches are absorbable.  Leave the steri-strips in place when changing your dressings, they will peel off with time, usually 2-3 weeks.  Activity:  Increase activity slowly as tolerated, but follow the weight bearing instructions below.  The rules on driving is that you can not be taking narcotics while you drive, and you must feel in control of the vehicle.    Weight Bearing:   toe touch weight bearing right lower extremity with walker.    Blood clot prevention (DVT Prophylaxis): After surgery you are at an increased risk for a blood clot. You will resume aspirin and plavix to help reduce your risk of getting a blood clot. This will help prevent a blood clot. Signs of a pulmonary embolus (blood clot in the lungs) include sudden short of breath, feeling lightheaded or dizzy, chest pain with a deep breath, rapid pulse rapid breathing. Signs of a blood clot in your arms or legs include new  unexplained swelling and cramping, warm, red or darkened skin around the painful area. Please call the office or 911 right away if these signs or symptoms develop. To prevent constipation: you may use a stool softener such as -  Colace (over the counter) 100 mg by mouth twice a day  Drink plenty of fluids (prune juice may be helpful) and high fiber foods Miralax (over the counter) for constipation as needed.    Itching:  If you experience itching with your medications, try taking only a single pain pill, or even half a pain pill at a time.  You may take up to 10 pain pills per day, and you can also use benadryl over the counter for itching or also to help with sleep.   Precautions:  If you experience chest pain or shortness of breath - call 911 immediately for transfer to the hospital emergency department!!   Call office [phone]) for the following: Temperature greater than 101F Persistent nausea and vomiting Severe uncontrolled pain Redness, tenderness, or signs of infection (pain, swelling, redness, odor or green/yellow discharge around the site) Difficulty breathing, headache or visual disturbances Hives Persistent dizziness or light-headedness Extreme fatigue Any other questions or concerns you may have after discharge  In an emergency, call 911 or go to an Emergency Department at a nearby hospital  Follow- Up Appointment:  Please call for an appointment to  be seen approximately 2-3 week after surgery in [facility] with your surgeon Dr. Weber Cooks - [phone] Address: [address], [place], [place] [address]

## 2023-05-24 NOTE — Progress Notes (Signed)
 [facility]  PROGRESS NOTE  Darin [name] ZOX:[id number] DOB: March 24, 1932 DOA: 05/23/2023 PCP: [name], MD   Brief HPI:   89 y.o. male with past medical history of chronic heart failure, GERD, hyperlipidemia, hypertension, osteoporosis, polymyalgia rheumatica presented to ED with complaint of right leg pain. Patient stated that he was walking with a walker and he tripped and fell.  In the ED, patient had stable vitals.  CBC showed a WBC elevated at 10.6.  BMP with creatinine at 0.8.  Imaging in the ED showed displaced angulated and comminuted fracture of the distal right femur.  Long-leg splint was placed in.  Orthopedics was consulted    Assessment/Plan:   Right distal femur displaced and comminuted fracture.  Status post ORIF -Pain well-controlled -Started on Lovenox for DVT prophylaxis -Orthopedics following    History of chronic heart failure.   Currently compensated.  Takes torsemide 20 mg daily, digoxin and Farxiga at home.   Continue metoprolol.   Hold off with torsemide and [place].  Digoxin level 0.5 ng/mL   GERD Continue Pepcid.   Hyperlipidemia On Zetia at home.  Will resume.   Hypertension Will continue to monitor.  Patient takes benazepril metoprolol at home.  Will resume metoprolol for now.   Hypothyroidism.   On Synthroid at home.  TSH elevated at 13.22, was 15.43 on 05/19/2023 -Dose of Synthroid was changed from 50 mcg daily to 75 mcg daily by PCP on 05/19/2023   History of polymyalgia rheumatica.   Patient might need rehabilitation on discharge.   Has a home health aide and walker or wheelchair at home.    Medications     [START ON 05/25/2023] aspirin EC  81 mg Oral BID   [START ON 05/25/2023] enoxaparin  40 mg Subcutaneous Q24H   ezetimibe  10 mg Oral Daily   famotidine  20 mg Oral QHS   finasteride  5 mg Oral QHS   gabapentin  100 mg Oral QID   levothyroxine  75 mcg Oral QAC breakfast   metoprolol succinate  25 mg Oral Daily    senna-docusate  1 tablet Oral BID     Data Reviewed:   CBG:  No results for input(s): "GLUCAP" in the last 168 hours.  SpO2: 100 % O2 Flow Rate (L/min): 2 L/min    Vitals:   05/24/23 1045 05/24/23 1100 05/24/23 1117 05/24/23 1349  BP: 119/84 139/70 (!) 129/57 114/60  Pulse: 87  84 79  Resp: 14  15 18   Temp:   98.1 F (36.7 C) (!) 97.5 F (36.4 C)  TempSrc:   Oral   SpO2: 100%  97% 100%  Weight:      Height:          Data Reviewed:  Basic Metabolic Panel: Recent Labs  Lab 05/19/23 1211 05/23/23 1542 05/24/23 0400  NA 139 136 138  K 3.8 3.7 4.1  CL 98 99 99  CO2 31 26 30   GLUCOSE 103* 117* 109*  BUN 27* 22 27*  CREATININE 1.13 0.89 1.12  CALCIUM 9.7 9.6 9.8  MG  --   --  2.6*    CBC: Recent Labs  Lab 05/23/23 1542 05/24/23 0400  WBC 10.6* 7.1  HGB 14.3 13.8  HCT 43.6 44.1  MCV 95.6 99.5  PLT 195 185    LFT No results for input(s): "AST", "ALT", "ALKPHOS", "BILITOT", "PROT", "ALBUMIN" in the last 168 hours.   Antibiotics: Anti-infectives (From admission, onward)    Start  Dose/Rate Route Frequency Ordered Stop   05/24/23 1600  ceFAZolin (ANCEF) IVPB 2g/100 mL premix        2 g 200 mL/hr over 30 Minutes Intravenous Every 8 hours 05/24/23 1116 05/25/23 0559   05/24/23 0919  vancomycin (VANCOCIN) powder  Status:  Discontinued          As needed 05/24/23 0919 05/24/23 1111   05/24/23 0730  ceFAZolin (ANCEF) IVPB 2g/100 mL premix        2 g 200 mL/hr over 30 Minutes Intravenous On call to O.R. 05/24/23 0726 05/24/23 0827   05/24/23 0727  ceFAZolin (ANCEF) 2-4 GM/100ML-% IVPB       Note to Pharmacy: Norva Pavlov: cabinet override      05/24/23 0727 05/24/23 0832        DVT prophylaxis: Lovenox  Code Status: DNR  Family Communication: No family at bedside   CONSULTS    Subjective   Appears in no acute distress, seen status post surgery   Objective    Physical Examination:   General-appears in no acute  distress Heart-S1-S2, regular, no murmur auscultated Lungs-clear to auscultation bilaterally, no wheezing or crackles auscultated Abdomen-soft, nontender, no organomegaly Extremities-no edema in the lower extremities Neuro-alert, oriented x3, no focal deficit noted  Status is: Inpatient:             Meredeth Ide   [facility] If 7PM-7AM, please contact night-coverage at [email or web address], Office  [phone]   05/24/2023, 2:02 PM  LOS: 1 day

## 2023-05-24 NOTE — Anesthesia Procedure Notes (Signed)
 Procedure Name: Intubation Date/Time: 05/24/2023 8:02 AM  Performed by: Oletha Cruel, CRNAPre-anesthesia Checklist: Patient identified, Emergency Drugs available, Suction available and Patient being monitored Patient Re-evaluated:Patient Re-evaluated prior to induction Oxygen Delivery Method: Circle system utilized Preoxygenation: Pre-oxygenation with 100% oxygen Induction Type: IV induction Ventilation: Mask ventilation without difficulty Laryngoscope Size: Mac and 4 Grade View: Grade III Tube type: Oral Tube size: 7.0 mm Number of attempts: 2 Airway Equipment and Method: Stylet Placement Confirmation: ETT inserted through vocal cords under direct vision, positive ETCO2, CO2 detector and breath sounds checked- equal and bilateral Secured at: 22 cm Tube secured with: Tape Dental Injury: Teeth and Oropharynx as per pre-operative assessment  Comments: Atraumatic intubation. Lips and teeth remain in preoperative condition.

## 2023-05-25 DIAGNOSIS — S7291XA Unspecified fracture of right femur, initial encounter for closed fracture: Secondary | ICD-10-CM

## 2023-05-25 DIAGNOSIS — K219 Gastro-esophageal reflux disease without esophagitis: Secondary | ICD-10-CM | POA: Diagnosis not present

## 2023-05-25 DIAGNOSIS — I10 Essential (primary) hypertension: Secondary | ICD-10-CM | POA: Diagnosis not present

## 2023-05-25 DIAGNOSIS — S79101A Unspecified physeal fracture of lower end of right femur, initial encounter for closed fracture: Secondary | ICD-10-CM | POA: Diagnosis not present

## 2023-05-25 LAB — CBC
HCT: 35.8 % — ABNORMAL LOW (ref 39.0–52.0)
Hemoglobin: 11.2 g/dL — ABNORMAL LOW (ref 13.0–17.0)
MCH: 31.4 pg (ref 26.0–34.0)
MCHC: 31.3 g/dL (ref 30.0–36.0)
MCV: 100.3 fL — ABNORMAL HIGH (ref 80.0–100.0)
Platelets: 151 10*3/uL (ref 150–400)
RBC: 3.57 MIL/uL — ABNORMAL LOW (ref 4.22–5.81)
RDW: 13.5 % (ref 11.5–15.5)
WBC: 8.1 10*3/uL (ref 4.0–10.5)
nRBC: 0 % (ref 0.0–0.2)

## 2023-05-25 LAB — COMPREHENSIVE METABOLIC PANEL
ALT: 12 U/L (ref 0–44)
AST: 19 U/L (ref 15–41)
Albumin: 2.7 g/dL — ABNORMAL LOW (ref 3.5–5.0)
Alkaline Phosphatase: 47 U/L (ref 38–126)
Anion gap: 10 (ref 5–15)
BUN: 25 mg/dL — ABNORMAL HIGH (ref 8–23)
CO2: 26 mmol/L (ref 22–32)
Calcium: 9 mg/dL (ref 8.9–10.3)
Chloride: 96 mmol/L — ABNORMAL LOW (ref 98–111)
Creatinine, Ser: 0.91 mg/dL (ref 0.61–1.24)
GFR, Estimated: >60 mL/min (ref >60)
Glucose, Bld: 119 mg/dL — ABNORMAL HIGH (ref 70–99)
Potassium: 4.2 mmol/L (ref 3.5–5.1)
Sodium: 132 mmol/L — ABNORMAL LOW (ref 135–145)
Total Bilirubin: 0.7 mg/dL (ref 0.0–1.2)
Total Protein: 5.9 g/dL — ABNORMAL LOW (ref 6.5–8.1)

## 2023-05-25 NOTE — Progress Notes (Signed)
 [facility]  PROGRESS NOTE  Darin [name] XBJ:[id number] DOB: [birth year] DOA: 05/23/2023 PCP: [name], MD   Brief HPI:   89 y.o. male with past medical history of chronic heart failure, GERD, hyperlipidemia, hypertension, osteoporosis, polymyalgia rheumatica presented to ED with complaint of right leg pain. Patient stated that he was walking with a walker and he tripped and fell.  In the ED, patient had stable vitals.  CBC showed a WBC elevated at 10.6.  BMP with creatinine at 0.8.  Imaging in the ED showed displaced angulated and comminuted fracture of the distal right femur.  Long-leg splint was placed in.  Orthopedics was consulted    Assessment/Plan:   Right distal femur displaced and comminuted fracture.  Status post ORIF -Pain well-controlled -Started on Lovenox for DVT prophylaxis -Orthopedics following   History of chronic heart failure.   Currently compensated.  Takes torsemide 20 mg daily, digoxin and Farxiga at home.   Continue metoprolol.   Hold off with torsemide and [place].  Digoxin level 0.5 ng/mL   GERD Continue Pepcid.   Hyperlipidemia On Zetia at home.  Will resume.   Hypertension Will continue to monitor.  Patient takes benazepril metoprolol at home.  Will resume metoprolol for now.   Hypothyroidism.   On Synthroid at home.  TSH elevated at 13.22, was 15.43 on 05/19/2023 -Dose of Synthroid was changed from 50 mcg daily to 75 mcg daily by PCP on 05/19/2023   History of polymyalgia rheumatica.   Patient might need rehabilitation on discharge.   Has a home health aide and walker or wheelchair at home.    Medications     enoxaparin  40 mg Subcutaneous Q24H   ezetimibe  10 mg Oral Daily   famotidine  20 mg Oral QHS   finasteride  5 mg Oral QHS   gabapentin  100 mg Oral QID   levothyroxine  75 mcg Oral QAC breakfast   metoprolol succinate  25 mg Oral Daily   senna-docusate  1 tablet Oral BID     Data Reviewed:   CBG:  Recent Labs   Lab 05/24/23 2152  GLUCAP 135*    SpO2: 99 % O2 Flow Rate (L/min): 2 L/min    Vitals:   05/24/23 1733 05/24/23 2155 05/25/23 0206 05/25/23 0528  BP: 123/68 (!) 107/59 116/60 (!) 113/57  Pulse: 79 80 83 82  Resp: 18 17 18 18   Temp: 97.6 F (36.4 C) 98 F (36.7 C) 98.7 F (37.1 C) 98.6 F (37 C)  TempSrc:    Oral  SpO2: 99% 100% 99% 99%  Weight:      Height:          Data Reviewed:  Basic Metabolic Panel: Recent Labs  Lab 05/19/23 1211 05/23/23 1542 05/24/23 0400 05/25/23 0336  NA 139 136 138 132*  K 3.8 3.7 4.1 4.2  CL 98 99 99 96*  CO2 31 26 30 26   GLUCOSE 103* 117* 109* 119*  BUN 27* 22 27* 25*  CREATININE 1.13 0.89 1.12 0.91  CALCIUM 9.7 9.6 9.8 9.0  MG  --   --  2.6*  --     CBC: Recent Labs  Lab 05/23/23 1542 05/24/23 0400 05/25/23 0336  WBC 10.6* 7.1 8.1  HGB 14.3 13.8 11.2*  HCT 43.6 44.1 35.8*  MCV 95.6 99.5 100.3*  PLT 195 185 151    LFT Recent Labs  Lab 05/25/23 0336  AST 19  ALT 12  ALKPHOS 47  BILITOT 0.7  PROT 5.9*  ALBUMIN 2.7*     Antibiotics: Anti-infectives (From admission, onward)    Start     Dose/Rate Route Frequency Ordered Stop   05/24/23 1600  ceFAZolin (ANCEF) IVPB 2g/100 mL premix        2 g 200 mL/hr over 30 Minutes Intravenous Every 8 hours 05/24/23 1116 05/24/23 2234   05/24/23 0919  vancomycin (VANCOCIN) powder  Status:  Discontinued          As needed 05/24/23 0919 05/24/23 1111   05/24/23 0730  ceFAZolin (ANCEF) IVPB 2g/100 mL premix        2 g 200 mL/hr over 30 Minutes Intravenous On call to O.R. 05/24/23 0726 05/24/23 0827   05/24/23 0727  ceFAZolin (ANCEF) 2-4 GM/100ML-% IVPB       Note to Pharmacy: Myrlene Broker M: cabinet override      05/24/23 0727 05/24/23 0832        DVT prophylaxis: Lovenox  Code Status: DNR  Family Communication: No family at bedside   CONSULTS    Subjective   Denies any complaints.  Pain well-controlled   Objective    Physical  Examination:   General-appears in no acute distress Heart-S1-S2, regular, no murmur auscultated Lungs-clear to auscultation bilaterally, no wheezing or crackles auscultated Abdomen-soft, nontender, no organomegaly Extremities-no edema in the lower extremities Neuro-alert, oriented x3, no focal deficit noted  Status is: Inpatient:             Meredeth Ide   [facility] If 7PM-7AM, please contact night-coverage at [email or web address], Office  [phone]   05/25/2023, 7:26 AM  LOS: 2 days

## 2023-05-25 NOTE — Progress Notes (Signed)
     Subjective:  Patient reports pain as mild.  Doing well this morning. No issues or concerns. Plan for mobilization with PT today.  Objective:   VITALS:   Vitals:   05/24/23 1733 05/24/23 2155 05/25/23 0206 05/25/23 0528  BP: 123/68 (!) 107/59 116/60 (!) 113/57  Pulse: 79 80 83 82  Resp: 18 17 18 18   Temp: 97.6 F (36.4 C) 98 F (36.7 C) 98.7 F (37.1 C) 98.6 F (37 C)  TempSrc:    Oral  SpO2: 99% 100% 99% 99%  Weight:      Height:        Sensation intact distally Intact pulses distally Dorsiflexion/Plantar flexion intact Incision: dressing C/D/I Compartment soft    Lab Results  Component Value Date   WBC 8.1 05/25/2023   HGB 11.2 (L) 05/25/2023   HCT 35.8 (L) 05/25/2023   MCV 100.3 (H) 05/25/2023   PLT 151 05/25/2023   BMET    Component Value Date/Time   NA 132 (L) 05/25/2023 0336   NA 137 02/17/2023 1043   K 4.2 05/25/2023 0336   CL 96 (L) 05/25/2023 0336   CO2 26 05/25/2023 0336   GLUCOSE 119 (H) 05/25/2023 0336   BUN 25 (H) 05/25/2023 0336   BUN 28 (H) 02/17/2023 1043   CREATININE 0.91 05/25/2023 0336   CREATININE 0.73 02/25/2020 0900   CALCIUM 9.0 05/25/2023 0336   EGFR 63 02/17/2023 1043   GFRNONAA >60 05/25/2023 0336   Xray: fracture well reduced hardware in good position, no adverse features  Assessment/Plan: 1 Day Post-Op   Principal Problem:   Femur fracture (HCC) Active Problems:   Essential hypertension   GERD (gastroesophageal reflux disease)   Hypothyroid   Fall at home, initial encounter  S/p ORIF R distal femur fracture 05/24/23  Post op recs: WB: Toe-touch weightbearing right lower extremity Abx: ancef x23 hours post op Imaging: PACU xrays Dressing: keep intact until follow up, change PRN if soiled or saturated. DVT prophylaxis: lovenox starting POD1, resume aspirin and Plavix upon discharge from the hospital Follow up: 2 weeks after surgery for a wound check with Dr. Blanchie Dessert at [facility].  Address:  [address], [place], [place] [address]  Office Phone: [phone]    Joen Laura 05/25/2023, 7:41 AM   Weber Cooks, MD  Contact information:   [phone] 7am-5pm epic message Dr. Blanchie Dessert, or call office for patient follow up: [phone] After hours and holidays please check [email or web address] for group call information for [facility]

## 2023-05-25 NOTE — Evaluation (Signed)
 Occupational Therapy Evaluation Patient Details Name: Darin [name] MRN: [id number] DOB: 05/22/1931 Today's Date: 05/25/2023   History of Present Illness   Patient is a 89 year old male admitted after fall at home resulting in Right displaced extra-articular supracondylar distal femur fracture. Ortho consulted, pt is now  s/p ORIF R supracondylar distal femur fracture with plate and screws on 05/24/23. WUJ:[id number] HF, GERD, hyperlipidemia, HTN, osteoporosis, polymyalgia rheumatica, CTR     Clinical Impressions Patient is a 89 year old male who was admitted for above. Patient was living at home alone prior level. Currently, patient is TD for transfers with increased time and cues for maintaining WB restrictions during scoot transfers. Patient unable to attempt standing with inability to maintain WB restrictions with RW. Patient was noted to have decreased functional activity tolerance, decreased endurance, decreased ability to maintain toe touch WB restriction, decreased standing balance, decreased safety awareness, and decreased knowledge of AD/AE impacting participation in ADLs. Patient will benefit from continued inpatient follow up therapy, <3 hours/day      If plan is discharge home, recommend the following:   Two people to help with walking and/or transfers;Assistance with cooking/housework;A lot of help with bathing/dressing/bathroom;Direct supervision/assist for medications management;Assist for transportation;Help with stairs or ramp for entrance;Direct supervision/assist for financial management     Functional Status Assessment   Patient has had a recent decline in their functional status and demonstrates the ability to make significant improvements in function in a reasonable and predictable amount of time.     Equipment Recommendations   None recommended by OT      Precautions/Restrictions   Precautions Precautions: Fall Restrictions Weight Bearing Restrictions Per  Provider Order: Yes RLE Weight Bearing Per Provider Order: Touchdown weight bearing     Mobility Bed Mobility Overal bed mobility: Needs Assistance Bed Mobility: Supine to Sit     Supine to sit: Supervision, HOB elevated, Used rails     General bed mobility comments: with increased time.    Transfers Overall transfer level: Needs assistance   Transfers: Bed to chair/wheelchair/BSC            Lateral/Scoot Transfers: Mod assist, +2 safety/equipment        Balance Overall balance assessment: History of Falls, Needs assistance   Sitting balance-Leahy Scale: Fair         Standing balance comment: unable                           ADL either performed or assessed with clinical judgement   ADL Overall ADL's : Needs assistance/impaired Eating/Feeding: Sitting;Modified independent   Grooming: Sitting;Supervision/safety   Upper Body Bathing: Sitting;Set up;Supervision/ safety   Lower Body Bathing: Sitting/lateral leans;Maximal assistance   Upper Body Dressing : Sitting;Supervision/safety;Set up   Lower Body Dressing: Sitting/lateral leans;Maximal assistance   Toilet Transfer: +2 for physical assistance;+2 for safety/equipment;Moderate assistance Toilet Transfer Details (indicate cue type and reason): to scoot to recliner. unable to transition into standing with TTWD status Toileting- Clothing Manipulation and Hygiene: Bed level;Total assistance               Vision Baseline Vision/History: 1 Wears glasses Vision Assessment?: No apparent visual deficits            Pertinent Vitals/Pain Pain Assessment Pain Assessment: 0-10 Pain Score: 6  Pain Location: R LE/knee Pain Descriptors / Indicators: Guarding, Grimacing, Sore Pain Intervention(s): Limited activity within patient's tolerance, Monitored during session, Repositioned     Extremity/Trunk Assessment  Upper Extremity Assessment Upper Extremity Assessment: Overall WFL for tasks  assessed;Right hand dominant (patient reported h/o wrist fractures bilaterally and fracture in L elbow.)   Lower Extremity Assessment Lower Extremity Assessment: Defer to PT evaluation RLE Deficits / Details: knee grossly 2+/5, knee flexion 30 to 90 degrees, testing limited by pain RLE: Unable to fully assess due to pain       Communication Communication Communication: No apparent difficulties   Cognition Arousal: Alert Behavior During Therapy: WFL for tasks assessed/performed Cognition: No apparent impairments           Following commands: Intact       Cueing  General Comments   Cueing Techniques: Verbal cues              Home Living Family/patient expects to be discharged to:: Skilled nursing facility Living Arrangements: Alone             Additional Comments: Has personal care aide 4- 5 days a week, 7 hours total per day but divided up between AM & PM.      Prior Functioning/Environment Prior Level of Function : Needs assist             Mobility Comments: using rolling walker for transfers to and from wheelchair. patient has wheelchair follow for short distance ambulation with rolling walker around the house. does not ambulate without second person for chair follow ADLs Comments: intermittent assistance from personal care attendant but can perform some ADLs independently    OT Problem List: Impaired balance (sitting and/or standing);Decreased knowledge of precautions;Cardiopulmonary status limiting activity;Decreased activity tolerance;Decreased knowledge of use of DME or AE;Pain   OT Treatment/Interventions: Self-care/ADL training;DME and/or AE instruction;Therapeutic activities;Balance training;Patient/family education;Energy conservation      OT Goals(Current goals can be found in the care plan section)   Acute Rehab OT Goals Patient Stated Goal: to go home OT Goal Formulation: With patient Time For Goal Achievement: 06/08/23 Potential to  Achieve Goals: Fair   OT Frequency:  Min 1X/week    Co-evaluation PT/OT/SLP Co-Evaluation/Treatment: Yes   PT goals addressed during session: Mobility/safety with mobility OT goals addressed during session: ADL's and self-care      AM-PAC OT "6 Clicks" Daily Activity     Outcome Measure Help from another person eating meals?: A Little Help from another person taking care of personal grooming?: A Little Help from another person toileting, which includes using toliet, bedpan, or urinal?: Total Help from another person bathing (including washing, rinsing, drying)?: A Lot Help from another person to put on and taking off regular upper body clothing?: A Little Help from another person to put on and taking off regular lower body clothing?: A Lot 6 Click Score: 14   End of Session Equipment Utilized During Treatment: Gait belt Nurse Communication: Mobility status;Patient requests pain meds  Activity Tolerance: Patient limited by pain Patient left: in chair;with call bell/phone within reach;with chair alarm set  OT Visit Diagnosis: Unsteadiness on feet (R26.81);Other abnormalities of gait and mobility (R26.89);Pain                Time: 7829-5621 OT Time Calculation (min): 22 min Charges:  OT General Charges $OT Visit: 1 Visit OT Evaluation $OT Eval Moderate Complexity: 1 Mod  Marieliz Strang OTR/L, MS Acute Rehabilitation Department Office# [phone]   Selinda Flavin 05/25/2023, 12:59 PM

## 2023-05-25 NOTE — Evaluation (Signed)
 Physical Therapy Evaluation Patient Details Name: Sabrina Arriaga MRN: [id number] DOB: 1931/06/03 Today's Date: 05/25/2023  History of Present Illness  89 yo male admitted after fall at home resulting in Right displaced extra-articular supracondylar distal femur fracture. Ortho consulted, pt is now  s/p ORIF R supracondylar distal femur fracture with plate and screws on 05/24/23. AOZ:HYQMVHQ HF, GERD, hyperlipidemia, HTN, osteoporosis, polymyalgia rheumatica, CTR  Clinical Impression  Pt admitted with above diagnosis.  PT was amb short distances, transferring bed<>chair at home, had caregivers for a portion of the day.  Pt unable to stand this date, performed latera scooting transfer with mod assist which pt tol well and was able to comply with TDWB.  Patient will benefit from continued inpatient follow up therapy, <3 hours/day   Pt currently with functional limitations due to the deficits listed below (see PT Problem List). Pt will benefit from acute skilled PT to increase their independence and safety with mobility to allow discharge.           If plan is discharge home, recommend the following: A lot of help with walking and/or transfers;Assistance with cooking/housework;Assist for transportation;Help with stairs or ramp for entrance;A lot of help with bathing/dressing/bathroom   Can travel by private vehicle   No    Equipment Recommendations None recommended by PT  Recommendations for Other Services       Functional Status Assessment Patient has had a recent decline in their functional status and demonstrates the ability to make significant improvements in function in a reasonable and predictable amount of time.     Precautions / Restrictions Precautions Precautions: Fall Restrictions Weight Bearing Restrictions Per Provider Order: Yes RLE Weight Bearing Per Provider Order: Touchdown weight bearing      Mobility  Bed Mobility Overal bed mobility: Needs Assistance              General bed mobility comments: to EOB with OT    Transfers Overall transfer level: Needs assistance   Transfers: Sit to/from Stand, Bed to chair/wheelchair/BSC            Lateral/Scoot Transfers: Mod assist, +2 safety/equipment General transfer comment: attempted standing with OT however unable to clear hips from bed; lateral scooting transfer bed to chair- pt self assisting with bil UEs, LLE and therapist utilzing bed pad to complete transition. pt compliant with TDWB    Ambulation/Gait                  Stairs            Wheelchair Mobility     Tilt Bed    Modified Rankin (Stroke Patients Only)       Balance Overall balance assessment: History of Falls, Needs assistance           Standing balance-Leahy Scale: Zero                               Pertinent Vitals/Pain Pain Assessment Pain Assessment: 0-10 Pain Score: 6  Pain Location: R LE/knee Pain Descriptors / Indicators: Guarding, Grimacing, Sore Pain Intervention(s): Limited activity within patient's tolerance, Monitored during session, Repositioned    Home Living Family/patient expects to be discharged to:: Skilled nursing facility Living Arrangements: Alone                 Additional Comments: Has personal care aide 4- 5 days a week, 7 hours total per day but divided up between AM & PM.  Prior Function Prior Level of Function : Needs assist             Mobility Comments: using rolling walker for transfers to and from wheelchair. patient has wheelchair follow for short distance ambulation with rolling walker around the house. does not ambulate without second person for chair follow (some info taken from previous admission)       Extremity/Trunk Assessment   Upper Extremity Assessment Upper Extremity Assessment: Defer to OT evaluation    Lower Extremity Assessment Lower Extremity Assessment: RLE deficits/detail RLE Deficits / Details: knee grossly  2+/5, knee flexion 30 to 90 degrees, testing limited by pain RLE: Unable to fully assess due to pain       Communication   Communication Communication: No apparent difficulties    Cognition Arousal: Alert Behavior During Therapy: WFL for tasks assessed/performed   PT - Cognitive impairments: No apparent impairments                         Following commands: Intact       Cueing Cueing Techniques: Verbal cues     General Comments      Exercises     Assessment/Plan    PT Assessment Patient needs continued PT services  PT Problem List Decreased strength;Decreased range of motion;Decreased activity tolerance;Decreased balance;Decreased knowledge of use of DME;Decreased mobility;Pain       PT Treatment Interventions DME instruction;Functional mobility training;Gait training;Therapeutic activities;Patient/family education;Therapeutic exercise    PT Goals (Current goals can be found in the Care Plan section)  Acute Rehab PT Goals Patient Stated Goal: back to PLOF PT Goal Formulation: With patient Time For Goal Achievement: 06/08/23 Potential to Achieve Goals: Good    Frequency Min 1X/week     Co-evaluation PT/OT/SLP Co-Evaluation/Treatment: Yes   PT goals addressed during session: Mobility/safety with mobility OT goals addressed during session: ADL's and self-care       AM-PAC PT "6 Clicks" Mobility  Outcome Measure Help needed turning from your back to your side while in a flat bed without using bedrails?: A Little Help needed moving from lying on your back to sitting on the side of a flat bed without using bedrails?: A Little Help needed moving to and from a bed to a chair (including a wheelchair)?: A Lot Help needed standing up from a chair using your arms (e.g., wheelchair or bedside chair)?: Total Help needed to walk in hospital room?: Total Help needed climbing 3-5 steps with a railing? : Total 6 Click Score: 11    End of Session   Activity  Tolerance: Patient tolerated treatment well Patient left: with call bell/phone within reach;in chair;with chair alarm set Nurse Communication: Mobility status PT Visit Diagnosis: Other abnormalities of gait and mobility (R26.89)    Time: 1003-1015 PT Time Calculation (min) (ACUTE ONLY): 12 min   Charges:   PT Evaluation $PT Eval Low Complexity: 1 Low   PT General Charges $$ ACUTE PT VISIT: 1 Visit         Lashunda Greis, PT  Acute Rehab Dept [facility]) [phone]  05/25/2023   [facility] 05/25/2023, 11:35 AM

## 2023-05-25 NOTE — Plan of Care (Signed)
  Problem: Clinical Measurements: Goal: Ability to maintain clinical measurements within normal limits will improve Outcome: Progressing   Problem: Clinical Measurements: Goal: Ability to maintain clinical measurements within normal limits will improve Outcome: Progressing   Problem: Elimination: Goal: Will not experience complications related to urinary retention Outcome: Progressing   Problem: Pain Managment: Goal: General experience of comfort will improve and/or be controlled Outcome: Progressing   Problem: Safety: Goal: Ability to remain free from injury will improve Outcome: Progressing

## 2023-05-26 ENCOUNTER — Encounter (HOSPITAL_COMMUNITY): Payer: Self-pay | Admitting: Orthopedic Surgery

## 2023-05-26 ENCOUNTER — Other Ambulatory Visit: Payer: Self-pay | Admitting: Family Medicine

## 2023-05-26 ENCOUNTER — Other Ambulatory Visit: Payer: Self-pay

## 2023-05-26 DIAGNOSIS — K219 Gastro-esophageal reflux disease without esophagitis: Secondary | ICD-10-CM | POA: Diagnosis not present

## 2023-05-26 DIAGNOSIS — S7291XA Unspecified fracture of right femur, initial encounter for closed fracture: Secondary | ICD-10-CM | POA: Diagnosis not present

## 2023-05-26 DIAGNOSIS — I10 Essential (primary) hypertension: Secondary | ICD-10-CM | POA: Diagnosis not present

## 2023-05-26 DIAGNOSIS — S79101A Unspecified physeal fracture of lower end of right femur, initial encounter for closed fracture: Secondary | ICD-10-CM | POA: Diagnosis not present

## 2023-05-26 MED ORDER — ASPIRIN 81 MG PO TBEC
81.0000 mg | DELAYED_RELEASE_TABLET | Freq: Two times a day (BID) | ORAL | Status: AC
Start: 2023-05-26 — End: 2023-06-23

## 2023-05-26 MED ORDER — HYDROCODONE-ACETAMINOPHEN 5-325 MG PO TABS: 1.0000 | ORAL_TABLET | Freq: Four times a day (QID) | ORAL | 0 refills | Status: AC | PRN

## 2023-05-26 NOTE — TOC Initial Note (Signed)
 Transition of Care [facility]) - Initial/Assessment Note    Patient Details  Name: Darin [name] MRN: [id number] Date of Birth: 11-12-1931  [facility]) CM/SW Contact:    Amada Jupiter, LCSW Phone Number: 05/26/2023, 3:12 PM  Clinical Narrative:                  Met with pt today to review therapy recommendations for SNF.  Pt is aware and agreeable with this plan.  Confirms he does live alone and has private duty caregivers but not 24/7.  We reviewed SNF preferences and pt agreeable with CSW beginning bed search.    Expected Discharge Plan: Skilled Nursing Facility Barriers to Discharge: SNF Pending bed offer   Patient Goals and CMS Choice Patient states their goals for this hospitalization and ongoing recovery are:: return home following rehab          Expected Discharge Plan and Services In-house Referral: Clinical Social Work   Post Acute Care Choice: Skilled Nursing Facility Living arrangements for the past 2 months: Single Family Home                 DME Arranged: N/A DME Agency: NA                  Prior Living Arrangements/Services Living arrangements for the past 2 months: Single Family Home Lives with:: Self Patient language and need for interpreter reviewed:: Yes Do you feel safe going back to the place where you live?: Yes      Need for Family Participation in Patient Care: Yes (Comment) Care giver support system in place?: No (comment)   Criminal Activity/Legal Involvement Pertinent to Current Situation/Hospitalization: No - Comment as needed  Activities of Daily Living   ADL Screening (condition at time of admission) Independently performs ADLs?: No Does the patient have a NEW difficulty with bathing/dressing/toileting/self-feeding that is expected to last >3 days?: Yes (Initiates electronic notice to provider for possible OT consult) Does the patient have a NEW difficulty with getting in/out of bed, walking, or climbing stairs that is expected  to last >3 days?: Yes (Initiates electronic notice to provider for possible PT consult) Does the patient have a NEW difficulty with communication that is expected to last >3 days?: No Is the patient deaf or have difficulty hearing?: No Does the patient have difficulty seeing, even when wearing glasses/contacts?: No Does the patient have difficulty concentrating, remembering, or making decisions?: No  Permission Sought/Granted Permission sought to share information with : Family Supports, Magazine features editor Permission granted to share information with : Yes, Verbal Permission Granted  Share Information with NAME: cousin, Elita Quick @ [phone]  Permission granted to share info w AGENCY: SNFs        Emotional Assessment Appearance:: Appears stated age Attitude/Demeanor/Rapport: Engaged, Gracious Affect (typically observed): Accepting Orientation: : Oriented to Self, Oriented to Place, Oriented to  Time, Oriented to Situation Alcohol / Substance Use: Not Applicable Psych Involvement: No (comment)  Admission diagnosis:  Femur fracture (HCC) [S72.90XA] Closed fracture of right femur, unspecified fracture morphology, unspecified portion of femur, initial encounter (HCC) [S72.91XA] Patient Active Problem List   Diagnosis Date Noted   Femur fracture (HCC) 05/23/2023   Fall at home, initial encounter 05/23/2023   Atrial fibrillation (HCC) 02/11/2023   Hypothyroid 02/11/2023   Acute CHF (congestive heart failure) (HCC) 02/04/2023   Acute renal failure (HCC) 01/22/2023   Hyperkalemia 01/22/2023   Hypotension 01/22/2023   Acute exacerbation of CHF (congestive heart failure) (  HCC) 01/21/2023   Acute respiratory failure with hypoxia (HCC) 12/03/2022   CHF exacerbation (HCC) 12/03/2022   Normocytic anemia 12/03/2022   Elevated troponin/likely NSTEMI 12/03/2022   Trigger finger, right middle finger 07/10/2022   Chronic heel pain, right 11/28/2021   Tibia/fibula fracture,  right, closed, initial encounter 05/20/2021   Callus 04/27/2021   Arthritis of right ankle 10/31/2020   Trigger finger, left ring finger 09/15/2020   Greater trochanteric bursitis of left hip 02/12/2019   Strain of left piriformis muscle 10/05/2018   Pain due to onychomycosis of toenails of both feet 09/23/2018   Chronic left SI joint pain 04/27/2018   Right wrist pain 02/05/2018   Degenerative arthritis of right knee 10/23/2017   Arthritis of right knee 05/20/2017   Leg edema, right 02/20/2015   BPH (benign prostatic hyperplasia) 12/09/2014   Chronic radicular low back pain 08/06/2012   Syncope 03/29/2012   Hyponatremia 03/29/2012   Localized osteoarthrosis, lower leg 06/20/2010   VARICOSE VEINS LOWER EXTREMITIES W/INFLAMMATION 02/12/2010   SEC LOCALIZED OSTEOARTHROSIS INVOLVING HAND 10/16/2009   DEGENERATIVE DISC DISEASE, LUMBOSACRAL SPINE W/RADICULOPATHY 06/19/2009   CARCINOMA, BASAL CELL 02/07/2009   CATARACT ASSOCIATED WITH OTHER SYNDROMES 08/01/2008   DRY SKIN 08/01/2008   CERUMEN IMPACTION, BILATERAL 03/01/2008   CONSTIPATION, DRUG INDUCED 12/28/2007   POLYNEUROPATHY OTHER DISEASES CLASSIFIED ELSW 09/24/2007   Hyperlipidemia 09/22/2006   Essential hypertension 09/22/2006   GERD (gastroesophageal reflux disease) 09/22/2006   Osteoarthritis 09/22/2006   PCP:  [name], MD Pharmacy:   [facility] [place], [place] - [address] [place] [address] Phone: [phone] Fax: [phone]  [facility] - [place], [place] - [address] [facility] AT Portal to Registered [facility] [address] [place] [address] Phone: [phone] Fax: [phone]     Social Drivers of Health (SDOH) Social History: SDOH Screenings   Food Insecurity: No Food Insecurity (05/23/2023)  Housing: Low Risk  (05/23/2023)  Transportation Needs: No Transportation Needs (05/23/2023)   Utilities: Not At Risk (05/23/2023)  Alcohol Screen: Low Risk  (08/30/2022)  Depression (PHQ2-9): Low Risk  (08/30/2022)  Financial Resource Strain: Low Risk  (08/30/2022)  Physical Activity: Sufficiently Active (08/30/2022)  Social Connections: Moderately Isolated (05/23/2023)  Stress: No Stress Concern Present (08/30/2022)  Tobacco Use: Low Risk  (05/24/2023)   SDOH Interventions:     Readmission Risk Interventions    05/26/2023    3:10 PM  Readmission Risk Prevention Plan  Transportation Screening Complete  PCP or Specialist Appt within 5-7 Days Complete  Home Care Screening Complete  Medication Review (RN CM) Complete

## 2023-05-26 NOTE — [place] FL2 (Signed)
 [place] MEDICAID FL2 LEVEL OF CARE FORM     IDENTIFICATION  Patient Name: Darin [name] Birthdate: Jul 04, 1931 Sex: male Admission Date (Current Location): 05/23/2023  [facility] [place] Number:  Producer, television/film/video and Address:  [facility],  [address], [place] [address]      Provider Number: [id number]  Attending Physician Name and Address:  Meredeth Ide, MD  Relative Name and Phone Number:  cousin, Elita Quick @ [phone]    Current Level of Care: Hospital Recommended Level of Care: Skilled Nursing Facility Prior Approval Number:    Date Approved/Denied:   PASRR Number: [id number]  Discharge Plan: SNF    Current Diagnoses: Patient Active Problem List   Diagnosis Date Noted   Femur fracture (HCC) 05/23/2023   Fall at home, initial encounter 05/23/2023   Atrial fibrillation (HCC) 02/11/2023   Hypothyroid 02/11/2023   Acute CHF (congestive heart failure) (HCC) 02/04/2023   Acute renal failure (HCC) 01/22/2023   Hyperkalemia 01/22/2023   Hypotension 01/22/2023   Acute exacerbation of CHF (congestive heart failure) (HCC) 01/21/2023   Acute respiratory failure with hypoxia (HCC) 12/03/2022   CHF exacerbation (HCC) 12/03/2022   Normocytic anemia 12/03/2022   Elevated troponin/likely NSTEMI 12/03/2022   Trigger finger, right middle finger 07/10/2022   Chronic heel pain, right 11/28/2021   Tibia/fibula fracture, right, closed, initial encounter 05/20/2021   Callus 04/27/2021   Arthritis of right ankle 10/31/2020   Trigger finger, left ring finger 09/15/2020   Greater trochanteric bursitis of left hip 02/12/2019   Strain of left piriformis muscle 10/05/2018   Pain due to onychomycosis of toenails of both feet 09/23/2018   Chronic left SI joint pain 04/27/2018   Right wrist pain 02/05/2018   Degenerative arthritis of right knee 10/23/2017   Arthritis of right knee 05/20/2017   Leg edema, right 02/20/2015   BPH (benign prostatic  hyperplasia) 12/09/2014   Chronic radicular low back pain 08/06/2012   Syncope 03/29/2012   Hyponatremia 03/29/2012   Localized osteoarthrosis, lower leg 06/20/2010   VARICOSE VEINS LOWER EXTREMITIES W/INFLAMMATION 02/12/2010   SEC LOCALIZED OSTEOARTHROSIS INVOLVING HAND 10/16/2009   DEGENERATIVE DISC DISEASE, LUMBOSACRAL SPINE W/RADICULOPATHY 06/19/2009   CARCINOMA, BASAL CELL 02/07/2009   CATARACT ASSOCIATED WITH OTHER SYNDROMES 08/01/2008   DRY SKIN 08/01/2008   CERUMEN IMPACTION, BILATERAL 03/01/2008   CONSTIPATION, DRUG INDUCED 12/28/2007   POLYNEUROPATHY OTHER DISEASES CLASSIFIED ELSW 09/24/2007   Hyperlipidemia 09/22/2006   Essential hypertension 09/22/2006   GERD (gastroesophageal reflux disease) 09/22/2006   Osteoarthritis 09/22/2006    Orientation RESPIRATION BLADDER Height & Weight     Self, Time, Situation, Place  Normal Continent Weight: 189 lb (85.7 kg) Height:  6\' 1"  (185.4 cm)  BEHAVIORAL SYMPTOMS/MOOD NEUROLOGICAL BOWEL NUTRITION STATUS      Continent Diet (regular)  AMBULATORY STATUS COMMUNICATION OF NEEDS Skin   Extensive Assist Verbally Other (Comment) (surgical incision only)                       Personal Care Assistance Level of Assistance  Bathing, Dressing Bathing Assistance: Limited assistance   Dressing Assistance: Limited assistance     Functional Limitations Info  Sight, Hearing, Speech Sight Info: Adequate Hearing Info: Adequate Speech Info: Adequate    SPECIAL CARE FACTORS FREQUENCY  PT (By licensed PT), OT (By licensed OT)     PT Frequency: 5x/wk OT Frequency: 5x/wk            Contractures Contractures Info: Not present  Additional Factors Info  Code Status, Allergies Code Status Info: DNR Allergies Info: statins           Current Medications (05/26/2023):  This is the current hospital active medication list Current Facility-Administered Medications  Medication Dose Route Frequency Provider Last Rate Last Admin    acetaminophen (TYLENOL) tablet 650 mg  650 mg Oral Q6H PRN Joen Laura, MD   650 mg at 05/25/23 2335   enoxaparin (LOVENOX) injection 40 mg  40 mg Subcutaneous Q24H Joen Laura, MD   40 mg at 05/26/23 1009   ezetimibe (ZETIA) tablet 10 mg  10 mg Oral Daily Joen Laura, MD   10 mg at 05/26/23 1009   famotidine (PEPCID) tablet 20 mg  20 mg Oral QHS Joen Laura, MD   20 mg at 05/25/23 2121   finasteride (PROSCAR) tablet 5 mg  5 mg Oral QHS Joen Laura, MD   5 mg at 05/25/23 2121   gabapentin (NEURONTIN) capsule 100 mg  100 mg Oral QID Joen Laura, MD   100 mg at 05/26/23 1243   levothyroxine (SYNTHROID) tablet 75 mcg  75 mcg Oral QAC breakfast Joen Laura, MD   75 mcg at 05/26/23 0550   methocarbamol (ROBAXIN) tablet 500 mg  500 mg Oral Q6H PRN Joen Laura, MD   500 mg at 05/25/23 2121   Or   methocarbamol (ROBAXIN) injection 500 mg  500 mg Intravenous Q6H PRN Joen Laura, MD       metoprolol succinate (TOPROL-XL) 24 hr tablet 25 mg  25 mg Oral Daily Joen Laura, MD   25 mg at 05/26/23 1009   morphine (PF) 2 MG/ML injection 0.5 mg  0.5 mg Intravenous Q2H PRN Joen Laura, MD   0.5 mg at 05/23/23 2006   oxyCODONE (Oxy IR/ROXICODONE) immediate release tablet 2.5-5 mg  2.5-5 mg Oral Q4H PRN Joen Laura, MD       phenol (CHLORASEPTIC) mouth spray 1 spray  1 spray Mouth/Throat PRN Luiz Iron, NP   1 spray at 05/25/23 0527   polyethylene glycol (MIRALAX / GLYCOLAX) packet 17 g  17 g Oral Daily PRN Joen Laura, MD   17 g at 05/25/23 0902   senna-docusate (Senokot-S) tablet 1 tablet  1 tablet Oral BID Joen Laura, MD   1 tablet at 05/26/23 1009   triamcinolone cream (KENALOG) 0.5 %   Topical Daily PRN Joen Laura, MD         Discharge Medications: Please see discharge summary for a list of discharge medications.  Relevant Imaging Results:  Relevant Lab  Results:   Additional Information SSN: [id number]  Amada Jupiter, LCSW

## 2023-05-26 NOTE — Plan of Care (Signed)
  Problem: Nutrition: Goal: Adequate nutrition will be maintained Outcome: Progressing   Problem: Pain Managment: Goal: General experience of comfort will improve and/or be controlled Outcome: Progressing   Problem: Safety: Goal: Ability to remain free from injury will improve Outcome: Progressing   Problem: Elimination: Goal: Will not experience complications related to bowel motility Outcome: Progressing

## 2023-05-26 NOTE — Progress Notes (Signed)
    2 Days Post-Op Procedure(s) (LRB): OPEN REDUCTION INTERNAL FIXATION (ORIF) DISTAL FEMUR FRACTURE (Right)  Subjective:  Patient reports pain as mild.  Slept okay.  Doing well this morning.  No issues or overnight events.  Mobilized with PT yesterday, able to stand with mod assist.  Likely DC to SNF.  Objective:   VITALS:   Vitals:   05/25/23 0901 05/25/23 1423 05/25/23 2120 05/26/23 0540  BP:  101/60 (!) 107/59 (!) 108/57  Pulse: (!) 110 93 80 75  Resp:  18 18 18   Temp:  98.2 F (36.8 C) 98.9 F (37.2 C) 97.8 F (36.6 C)  TempSrc:  Oral    SpO2:  97% 97% 97%  Weight:      Height:        Sensation intact distally Intact pulses distally Dorsiflexion/Plantar flexion intact Incision: dressing C/D/I Compartment soft Wiggles toes appropriately   Lab Results  Component Value Date   WBC 8.1 05/25/2023   HGB 11.2 (L) 05/25/2023   HCT 35.8 (L) 05/25/2023   MCV 100.3 (H) 05/25/2023   PLT 151 05/25/2023   BMET    Component Value Date/Time   NA 132 (L) 05/25/2023 0336   NA 137 02/17/2023 1043   K 4.2 05/25/2023 0336   CL 96 (L) 05/25/2023 0336   CO2 26 05/25/2023 0336   GLUCOSE 119 (H) 05/25/2023 0336   BUN 25 (H) 05/25/2023 0336   BUN 28 (H) 02/17/2023 1043   CREATININE 0.91 05/25/2023 0336   CREATININE 0.73 02/25/2020 0900   CALCIUM 9.0 05/25/2023 0336   EGFR 63 02/17/2023 1043   GFRNONAA >60 05/25/2023 0336   Xray: fracture well reduced hardware in good position, no adverse features  Assessment/Plan: 2 Days Post-Op   Principal Problem:   Femur fracture (HCC) Active Problems:   Essential hypertension   GERD (gastroesophageal reflux disease)   Hypothyroid   Fall at home, initial encounter  S/p ORIF R distal femur fracture 05/24/23  Post op recs: WB: Toe-touch weightbearing right lower extremity Abx: ancef x23 hours post op Imaging: PACU xrays Dressing: keep intact until follow up, change PRN if soiled or saturated. DVT prophylaxis: lovenox  starting POD1, resume aspirin and Plavix upon discharge from the hospital DC: per medicine, likely SNF Follow up: 2 weeks after surgery for a wound check with Dr. Blanchie Dessert at [facility].  Address: [address], [place], [place] [address]  Office Phone: [phone]    Darin [name] 05/26/2023, 7:01 AM   Contact information:   Weekdays 7am-5pm epic message Dr. Blanchie Dessert, or call office for patient follow up: [phone] After hours and holidays please check [email or web address] for group call information for [facility]

## 2023-05-26 NOTE — Progress Notes (Signed)
 [facility]  PROGRESS NOTE  Darin [name] ZOX:[id number] DOB: 1931-08-06 DOA: 05/23/2023 PCP: [name], MD   Brief HPI:   89 y.o. male with past medical history of chronic heart failure, GERD, hyperlipidemia, hypertension, osteoporosis, polymyalgia rheumatica presented to ED with complaint of right leg pain. Patient stated that he was walking with a walker and he tripped and fell.  In the ED, patient had stable vitals.  CBC showed a WBC elevated at 10.6.  BMP with creatinine at 0.8.  Imaging in the ED showed displaced angulated and comminuted fracture of the distal right femur.  Long-leg splint was placed in.  Orthopedics was consulted    Assessment/Plan:   Right distal femur displaced and comminuted fracture.  Status post ORIF -Pain well-controlled -Started on Lovenox for DVT prophylaxis -Orthopedics following   History of chronic combined systolic and diastolic heart failure.   Currently compensated.  Takes torsemide 20 mg daily, digoxin and Farxiga at home.   Continue metoprolol.   Hold off with torsemide and [place].  Digoxin level 0.5 ng/mL   GERD Continue Pepcid.   Hyperlipidemia On Zetia at home.  Will resume.   Hypertension Will continue to monitor.  Patient takes benazepril metoprolol at home.  Will resume metoprolol for now.   Hypothyroidism.   On Synthroid at home.  TSH elevated at 13.22, was 15.43 on 05/19/2023 -Dose of Synthroid was changed from 50 mcg daily to 75 mcg daily by PCP on 05/19/2023   History of polymyalgia rheumatica.   Patient might need rehabilitation on discharge.   Has a home health aide and walker or wheelchair at home.    Medications     enoxaparin  40 mg Subcutaneous Q24H   ezetimibe  10 mg Oral Daily   famotidine  20 mg Oral QHS   finasteride  5 mg Oral QHS   gabapentin  100 mg Oral QID   levothyroxine  75 mcg Oral QAC breakfast   metoprolol succinate  25 mg Oral Daily   senna-docusate  1 tablet Oral BID     Data  Reviewed:   CBG:  Recent Labs  Lab 05/24/23 2152  GLUCAP 135*    SpO2: 97 % O2 Flow Rate (L/min): 2 L/min    Vitals:   05/25/23 2120 05/26/23 0540 05/26/23 1006 05/26/23 1009  BP: (!) 107/59 (!) 108/57 131/62 131/62  Pulse: 80 75 91 91  Resp: 18 18 16    Temp: 98.9 F (37.2 C) 97.8 F (36.6 C) 98.3 F (36.8 C)   TempSrc:   Oral   SpO2: 97% 97% 97%   Weight:      Height:          Data Reviewed:  Basic Metabolic Panel: Recent Labs  Lab 05/19/23 1211 05/23/23 1542 05/24/23 0400 05/25/23 0336  NA 139 136 138 132*  K 3.8 3.7 4.1 4.2  CL 98 99 99 96*  CO2 31 26 30 26   GLUCOSE 103* 117* 109* 119*  BUN 27* 22 27* 25*  CREATININE 1.13 0.89 1.12 0.91  CALCIUM 9.7 9.6 9.8 9.0  MG  --   --  2.6*  --     CBC: Recent Labs  Lab 05/23/23 1542 05/24/23 0400 05/25/23 0336  WBC 10.6* 7.1 8.1  HGB 14.3 13.8 11.2*  HCT 43.6 44.1 35.8*  MCV 95.6 99.5 100.3*  PLT 195 185 151    LFT Recent Labs  Lab 05/25/23 0336  AST 19  ALT 12  ALKPHOS 47  BILITOT 0.7  PROT 5.9*  ALBUMIN 2.7*     Antibiotics: Anti-infectives (From admission, onward)    Start     Dose/Rate Route Frequency Ordered Stop   05/24/23 1600  ceFAZolin (ANCEF) IVPB 2g/100 mL premix        2 g 200 mL/hr over 30 Minutes Intravenous Every 8 hours 05/24/23 1116 05/25/23 0852   05/24/23 0919  vancomycin (VANCOCIN) powder  Status:  Discontinued          As needed 05/24/23 0919 05/24/23 1111   05/24/23 0730  ceFAZolin (ANCEF) IVPB 2g/100 mL premix        2 g 200 mL/hr over 30 Minutes Intravenous On call to O.R. 05/24/23 0726 05/24/23 0827   05/24/23 0727  ceFAZolin (ANCEF) 2-4 GM/100ML-% IVPB       Note to Pharmacy: Myrlene Broker M: cabinet override      05/24/23 0727 05/24/23 0832        DVT prophylaxis: Lovenox  Code Status: DNR  Family Communication: No family at bedside   CONSULTS    Subjective   Denies any complaints.   Objective    Physical  Examination:   General-appears in no acute distress Heart-S1-S2, regular, no murmur auscultated Lungs-clear to auscultation bilaterally, no wheezing or crackles auscultated Abdomen-soft, nontender, no organomegaly Extremities-no edema in the lower extremities Neuro-alert, oriented x3, no focal deficit noted  Status is: Inpatient:             Meredeth Ide   [facility] If 7PM-7AM, please contact night-coverage at [email or web address], Office  [phone]   05/26/2023, 11:30 AM  LOS: 3 days

## 2023-05-27 DIAGNOSIS — I10 Essential (primary) hypertension: Secondary | ICD-10-CM | POA: Diagnosis not present

## 2023-05-27 DIAGNOSIS — S79101A Unspecified physeal fracture of lower end of right femur, initial encounter for closed fracture: Secondary | ICD-10-CM | POA: Diagnosis not present

## 2023-05-27 DIAGNOSIS — S7291XA Unspecified fracture of right femur, initial encounter for closed fracture: Secondary | ICD-10-CM | POA: Diagnosis not present

## 2023-05-27 DIAGNOSIS — K219 Gastro-esophageal reflux disease without esophagitis: Secondary | ICD-10-CM | POA: Diagnosis not present

## 2023-05-27 MED ORDER — TORSEMIDE 20 MG PO TABS
20.0000 mg | ORAL_TABLET | Freq: Every day | ORAL | Status: DC
  Administered 2023-05-27 – 2023-05-28 (×2): 20 mg via ORAL
  Filled 2023-05-27 (×2): qty 1

## 2023-05-27 NOTE — Progress Notes (Signed)
     Subjective:  Patient reports pain as minimal. Doesn't recall if he was OOB at all yesterday. Encouraged daily OOB mobility. Plan for DC to SNF. No new issues.  Objective:   VITALS:   Vitals:   05/26/23 1009 05/26/23 1310 05/26/23 2227 05/27/23 0451  BP: 131/62 130/64 (!) 106/56 125/63  Pulse: 91 83 86 81  Resp:  16 16 18   Temp:  98.4 F (36.9 C) 98.7 F (37.1 C) 98.5 F (36.9 C)  TempSrc:  Oral Oral Oral  SpO2:  98% 96% 93%  Weight:      Height:        Sensation intact distally Intact pulses distally Dorsiflexion/Plantar flexion intact Incision: dressing C/D/I Compartment soft    Lab Results  Component Value Date   WBC 8.1 05/25/2023   HGB 11.2 (L) 05/25/2023   HCT 35.8 (L) 05/25/2023   MCV 100.3 (H) 05/25/2023   PLT 151 05/25/2023   BMET    Component Value Date/Time   NA 132 (L) 05/25/2023 0336   NA 137 02/17/2023 1043   K 4.2 05/25/2023 0336   CL 96 (L) 05/25/2023 0336   CO2 26 05/25/2023 0336   GLUCOSE 119 (H) 05/25/2023 0336   BUN 25 (H) 05/25/2023 0336   BUN 28 (H) 02/17/2023 1043   CREATININE 0.91 05/25/2023 0336   CREATININE 0.73 02/25/2020 0900   CALCIUM 9.0 05/25/2023 0336   EGFR 63 02/17/2023 1043   GFRNONAA >60 05/25/2023 0336   Xray: fracture well reduced hardware in good position, no adverse features  Assessment/Plan: 3 Days Post-Op   Principal Problem:   Femur fracture (HCC) Active Problems:   Essential hypertension   GERD (gastroesophageal reflux disease)   Hypothyroid   Fall at home, initial encounter  S/p ORIF R distal femur fracture 05/24/23  Post op recs: WB: Toe-touch weightbearing right lower extremity Abx: ancef x23 hours post op Imaging: PACU xrays Dressing: keep intact until follow up, change PRN if soiled or saturated. DVT prophylaxis: lovenox starting POD1, resume aspirin and Plavix upon discharge from the hospital Follow up: 2 weeks after surgery for a wound check with Dr. Blan[facility] Dessert at [facility].  Address: [address], [place], [place] [address]  Office Phone: [phone]    Darin [name] 05/27/2023, 7:16 AM   Weber Cooks, MD  Contact information:   [phone] 7am-5pm epic message Dr. Blanchie Dessert, or call office for patient follow up: [phone] After hours and holidays please check [email or web address] for group call information for [facility]

## 2023-05-27 NOTE — Progress Notes (Signed)
 Physical Therapy Treatment Patient Details Name: Darin [name] MRN: [id number] DOB: [birth year] Today's Date: 05/27/2023   History of Present Illness Patient is a 89 year old male admitted after fall at home resulting in Right displaced extra-articular supracondylar distal femur fracture. Ortho consulted, pt is now  s/p ORIF R supracondylar distal femur fracture with plate and screws on 05/24/23. NUU:VOZDGUY HF, GERD, hyperlipidemia, HTN, osteoporosis, polymyalgia rheumatica, CTR    PT Comments  Pt is progressing toward goals; repeated STS trials with +2 mod assist, able to maintain standing briefly. Fatigues easily, 3/4 DOE. Pt performed lateral scooting transfers with min assist however despite encouragement to be OOB pt called almost immediately asking nursing staff to put him back to bed  Continue to follow, plan is for SNF   If plan is discharge home, recommend the following: A lot of help with walking and/or transfers;Assistance with cooking/housework;Assist for transportation;Help with stairs or ramp for entrance;A lot of help with bathing/dressing/bathroom   Can travel by private vehicle     No  Equipment Recommendations  None recommended by PT    Recommendations for Other Services       Precautions / Restrictions Precautions Precautions: Fall Recall of Precautions/Restrictions: Intact Restrictions RLE Weight Bearing Per Provider Order: Touchdown weight bearing     Mobility  Bed Mobility Overal bed mobility: Needs Assistance Bed Mobility: Supine to Sit     Supine to sit: Contact guard, Used rails, Min assist     General bed mobility comments: initial assist to progress RLE off bed, able to self assist and scoot forward with incr time. CGA to prevent posterior LOB    Transfers Overall transfer level: Needs assistance Equipment used: Rolling walker (2 wheels) Transfers: Sit to/from Stand, Bed to chair/wheelchair/BSC Sit to Stand: Mod assist, +2 physical assistance, +2  safety/equipment          Lateral/Scoot Transfers: Min assist, +2 safety/equipment General transfer comment: STS x2 from elevated bed, assist to rise, transition to RW, pt with immediate need to return to sitting on 1st trial, 2nd standing trial able to maintain standing ~ 15 seconds with assist to balance and maintain TDWB. lateral scoot bed to chair with min assist, cues to self assist with UEs/LLE    Ambulation/Gait                   Stairs             Wheelchair Mobility     Tilt Bed    Modified Rankin (Stroke Patients Only)       Balance                                            Communication Communication Communication: No apparent difficulties  Cognition Arousal: Alert Behavior During Therapy: WFL for tasks assessed/performed   PT - Cognitive impairments: No apparent impairments                         Following commands: Intact      Cueing Cueing Techniques: Verbal cues, Tactile cues  Exercises General Exercises - Lower Extremity Ankle Circles/Pumps: AROM, Both, 5 reps Heel Slides: AAROM, Right, 5 reps    General Comments        Pertinent Vitals/Pain Pain Assessment Pain Assessment: 0-10 Pain Score: 3  Pain Location: R LE/knee Pain Descriptors / Indicators: Guarding,  Grimacing, Sore Pain Intervention(s): Limited activity within patient's tolerance, Monitored during session, Premedicated before session, Repositioned    Home Living                          Prior Function            PT Goals (current goals can now be found in the care plan section) Acute Rehab PT Goals Patient Stated Goal: back to PLOF PT Goal Formulation: With patient Time For Goal Achievement: 06/08/23 Potential to Achieve Goals: Good Progress towards PT goals: Progressing toward goals    Frequency    Min 1X/week      PT Plan      Co-evaluation              AM-PAC PT "6 Clicks" Mobility   Outcome  Measure  Help needed turning from your back to your side while in a flat bed without using bedrails?: A Little Help needed moving from lying on your back to sitting on the side of a flat bed without using bedrails?: A Little Help needed moving to and from a bed to a chair (including a wheelchair)?: A Lot Help needed standing up from a chair using your arms (e.g., wheelchair or bedside chair)?: Total Help needed to walk in hospital room?: Total Help needed climbing 3-5 steps with a railing? : Total 6 Click Score: 11    End of Session Equipment Utilized During Treatment: Gait belt Activity Tolerance: Patient tolerated treatment well Patient left: with call bell/phone within reach;in chair;with chair alarm set Nurse Communication: Mobility status PT Visit Diagnosis: Other abnormalities of gait and mobility (R26.89)     Time: 1100-1113 PT Time Calculation (min) (ACUTE ONLY): 13 min  Charges:    $Therapeutic Activity: 8-22 mins PT General Charges $$ ACUTE PT VISIT: 1 Visit                     Delice Bison, PT  Acute Rehab Dept [facility]) [phone]  05/27/2023    [facility] 05/27/2023, 11:51 AM

## 2023-05-27 NOTE — Progress Notes (Signed)
 [facility]  PROGRESS NOTE  Cutberto Winfree AOZ:[id number] DOB: 24-Jan-1932 DOA: 05/23/2023 PCP: [name], MD   Brief HPI:   89 y.o. male with past medical history of chronic heart failure, GERD, hyperlipidemia, hypertension, osteoporosis, polymyalgia rheumatica presented to ED with complaint of right leg pain. Patient stated that he was walking with a walker and he tripped and fell.  In the ED, patient had stable vitals.  CBC showed a WBC elevated at 10.6.  BMP with creatinine at 0.8.  Imaging in the ED showed displaced angulated and comminuted fracture of the distal right femur.  Long-leg splint was placed in.  Orthopedics was consulted    Assessment/Plan:   Right distal femur displaced and comminuted fracture.  Status post ORIF -Pain well-controlled -Started on Lovenox for DVT prophylaxis; resume aspirin and Plavix upon discharge from the hospital -WB: Toe-touch weightbearing right lower extremity  -Orthopedics following   History of chronic combined systolic and diastolic heart failure.   Currently compensated.  Takes torsemide 20 mg daily, digoxin and Farxiga at home.   Continue metoprolol.  Will restart torsemide 20 mg daily, continue to hold Farxiga digoxin level 0.5 ng/mL   GERD Continue Pepcid.   Hyperlipidemia On Zetia at home.  Will resume.   Hypertension Will continue to monitor.  Patient takes benazepril metoprolol at home.  Will resume metoprolol for now.   Hypothyroidism.   On Synthroid at home.  TSH elevated at 13.22, was 15.43 on 05/19/2023 -Dose of Synthroid was changed from 50 mcg daily to 75 mcg daily by PCP on 05/19/2023   History of polymyalgia rheumatica.   Patient might need rehabilitation on discharge.   Has a home health aide and walker or wheelchair at home.    Medications     enoxaparin  40 mg Subcutaneous Q24H   ezetimibe  10 mg Oral Daily   famotidine  20 mg Oral QHS   finasteride  5 mg Oral QHS   gabapentin  100 mg Oral QID    levothyroxine  75 mcg Oral QAC breakfast   metoprolol succinate  25 mg Oral Daily   senna-docusate  1 tablet Oral BID     Data Reviewed:   CBG:  Recent Labs  Lab 05/24/23 2152  GLUCAP 135*    SpO2: 93 % O2 Flow Rate (L/min): 2 L/min    Vitals:   05/26/23 1009 05/26/23 1310 05/26/23 2227 05/27/23 0451  BP: 131/62 130/64 (!) 106/56 125/63  Pulse: 91 83 86 81  Resp:  16 16 18   Temp:  98.4 F (36.9 C) 98.7 F (37.1 C) 98.5 F (36.9 C)  TempSrc:  Oral Oral Oral  SpO2:  98% 96% 93%  Weight:      Height:          Data Reviewed:  Basic Metabolic Panel: Recent Labs  Lab 05/23/23 1542 05/24/23 0400 05/25/23 0336  NA 136 138 132*  K 3.7 4.1 4.2  CL 99 99 96*  CO2 26 30 26   GLUCOSE 117* 109* 119*  BUN 22 27* 25*  CREATININE 0.89 1.12 0.91  CALCIUM 9.6 9.8 9.0  MG  --  2.6*  --     CBC: Recent Labs  Lab 05/23/23 1542 05/24/23 0400 05/25/23 0336  WBC 10.6* 7.1 8.1  HGB 14.3 13.8 11.2*  HCT 43.6 44.1 35.8*  MCV 95.6 99.5 100.3*  PLT 195 185 151    LFT Recent Labs  Lab 05/25/23 0336  AST 19  ALT 12  ALKPHOS  47  BILITOT 0.7  PROT 5.9*  ALBUMIN 2.7*     Antibiotics: Anti-infectives (From admission, onward)    Start     Dose/Rate Route Frequency Ordered Stop   05/24/23 1600  ceFAZolin (ANCEF) IVPB 2g/100 mL premix        2 g 200 mL/hr over 30 Minutes Intravenous Every 8 hours 05/24/23 1116 05/25/23 0852   05/24/23 0919  vancomycin (VANCOCIN) powder  Status:  Discontinued          As needed 05/24/23 0919 05/24/23 1111   05/24/23 0730  ceFAZolin (ANCEF) IVPB 2g/100 mL premix        2 g 200 mL/hr over 30 Minutes Intravenous On call to O.R. 05/24/23 0726 05/24/23 0827   05/24/23 0727  ceFAZolin (ANCEF) 2-4 GM/100ML-% IVPB       Note to Pharmacy: Myrlene Broker M: cabinet override      05/24/23 0727 05/24/23 0832        DVT prophylaxis: Lovenox  Code Status: DNR  Family Communication: No family at bedside   CONSULTS    Subjective    Denies pain.   Objective    Physical Examination:  General-appears in no acute distress Heart-S1-S2, regular, no murmur auscultated Lungs-clear to auscultation bilaterally, no wheezing or crackles auscultated Abdomen-soft, nontender, no organomegaly Extremities-no edema in the lower extremities Neuro-alert, oriented x3, no focal deficit noted   Status is: Inpatient:             Meredeth Ide   [facility] If 7PM-7AM, please contact night-coverage at [email or web address], Office  [phone]   05/27/2023, 7:52 AM  LOS: 4 days

## 2023-05-28 DIAGNOSIS — M1711 Unilateral primary osteoarthritis, right knee: Secondary | ICD-10-CM | POA: Diagnosis not present

## 2023-05-28 DIAGNOSIS — R296 Repeated falls: Secondary | ICD-10-CM | POA: Diagnosis not present

## 2023-05-28 DIAGNOSIS — G63 Polyneuropathy in diseases classified elsewhere: Secondary | ICD-10-CM | POA: Diagnosis not present

## 2023-05-28 DIAGNOSIS — N400 Enlarged prostate without lower urinary tract symptoms: Secondary | ICD-10-CM | POA: Diagnosis not present

## 2023-05-28 DIAGNOSIS — R531 Weakness: Secondary | ICD-10-CM | POA: Diagnosis not present

## 2023-05-28 DIAGNOSIS — R2689 Other abnormalities of gait and mobility: Secondary | ICD-10-CM | POA: Diagnosis not present

## 2023-05-28 DIAGNOSIS — S79101A Unspecified physeal fracture of lower end of right femur, initial encounter for closed fracture: Secondary | ICD-10-CM | POA: Diagnosis not present

## 2023-05-28 DIAGNOSIS — M810 Age-related osteoporosis without current pathological fracture: Secondary | ICD-10-CM | POA: Diagnosis not present

## 2023-05-28 DIAGNOSIS — S72451D Displaced supracondylar fracture without intracondylar extension of lower end of right femur, subsequent encounter for closed fracture with routine healing: Secondary | ICD-10-CM | POA: Diagnosis not present

## 2023-05-28 DIAGNOSIS — Z7401 Bed confinement status: Secondary | ICD-10-CM | POA: Diagnosis not present

## 2023-05-28 DIAGNOSIS — D649 Anemia, unspecified: Secondary | ICD-10-CM | POA: Diagnosis not present

## 2023-05-28 DIAGNOSIS — R1310 Dysphagia, unspecified: Secondary | ICD-10-CM | POA: Diagnosis not present

## 2023-05-28 DIAGNOSIS — E785 Hyperlipidemia, unspecified: Secondary | ICD-10-CM | POA: Diagnosis not present

## 2023-05-28 DIAGNOSIS — I509 Heart failure, unspecified: Secondary | ICD-10-CM | POA: Diagnosis not present

## 2023-05-28 DIAGNOSIS — I10 Essential (primary) hypertension: Secondary | ICD-10-CM | POA: Diagnosis not present

## 2023-05-28 DIAGNOSIS — M1990 Unspecified osteoarthritis, unspecified site: Secondary | ICD-10-CM | POA: Diagnosis not present

## 2023-05-28 DIAGNOSIS — E039 Hypothyroidism, unspecified: Secondary | ICD-10-CM | POA: Diagnosis not present

## 2023-05-28 DIAGNOSIS — K449 Diaphragmatic hernia without obstruction or gangrene: Secondary | ICD-10-CM | POA: Diagnosis not present

## 2023-05-28 DIAGNOSIS — Z9181 History of falling: Secondary | ICD-10-CM | POA: Diagnosis not present

## 2023-05-28 DIAGNOSIS — M353 Polymyalgia rheumatica: Secondary | ICD-10-CM | POA: Diagnosis not present

## 2023-05-28 DIAGNOSIS — I4891 Unspecified atrial fibrillation: Secondary | ICD-10-CM | POA: Diagnosis not present

## 2023-05-28 DIAGNOSIS — K219 Gastro-esophageal reflux disease without esophagitis: Secondary | ICD-10-CM | POA: Diagnosis not present

## 2023-05-28 DIAGNOSIS — I480 Paroxysmal atrial fibrillation: Secondary | ICD-10-CM | POA: Diagnosis not present

## 2023-05-28 DIAGNOSIS — R2681 Unsteadiness on feet: Secondary | ICD-10-CM | POA: Diagnosis not present

## 2023-05-28 DIAGNOSIS — M6281 Muscle weakness (generalized): Secondary | ICD-10-CM | POA: Diagnosis not present

## 2023-05-28 MED ORDER — PANTOPRAZOLE SODIUM 40 MG PO TBEC
40.0000 mg | DELAYED_RELEASE_TABLET | Freq: Every day | ORAL | Status: AC
Start: 2023-05-28 — End: 2024-05-27

## 2023-05-28 MED ORDER — METOPROLOL SUCCINATE ER 25 MG PO TB24: 12.5000 mg | ORAL_TABLET | Freq: Every day | ORAL | Status: AC

## 2023-05-28 MED ORDER — SENNOSIDES-DOCUSATE SODIUM 8.6-50 MG PO TABS
1.0000 | ORAL_TABLET | Freq: Two times a day (BID) | ORAL | Status: AC
Start: 2023-05-28 — End: ?

## 2023-05-28 NOTE — Consult Note (Signed)
 [facility] Liaison Consult Note   05/28/2023  Charley Miske 04/26/1931 [id number]   [facility] Consult:  remote review for patient at [facility]  Primary Care Provider:  [name], MD, with [facility] [place] which is listed to provide the transition of care follow up  Insurance: Medicare ACO REACH  Patient was reviewed for 4 admissions in 6 months and for readmission risk prevention needs with a  5-day length of stay to check for barriers to care when returning to community.  Patient was screened for hospitalization and on behalf of [facility]  Care Coordination to assess for post hospital community care needs.  Patient is being transitioned to  a skilled nursing facility level of care for post hospital transition.  Plan:  Patient scheduled to transition affiliated facility, the [facility] RN can be notified and follow for any known needs for transitional care needs for returning to post facility care coordination needs for returning to community.  For questions or referrals, please contact:  Charlesetta Shanks, RN, BSN, CCM   [facility], [facility] Liaison Direct Dial: [phone] or secure chat Email: [email or web address]

## 2023-05-28 NOTE — Discharge Summary (Signed)
 Physician Discharge Summary  Darin [name] VWU:[id number] DOB: October 18, 1931 DOA: 05/23/2023  PCP: [name], MD  Admit date: 05/23/2023 Discharge date: 05/28/2023  Admitted From: Home Disposition:  Home  Discharge Condition:Stable CODE STATUS:DNR Diet recommendation: Heart Healthy  Brief/Interim Summary: Patient is 89 y.o. male with past medical history of chronic heart failure, GERD, hyperlipidemia, hypertension, osteoporosis, polymyalgia rheumatica presented to ED with complaint of right leg pain. Patient stated that he was walking with a walker and he tripped and fell.  In the ED, patient had stable vitals.  CBC showed a WBC elevated at 10.6.  BMP with creatinine at 0.8.  Imaging in the ED showed displaced angulated and comminuted fracture of the distal right femur.  Long-leg splint was placed in.  Orthopedics was consulted, status post ORIF.  PT recommending SNF on discharge.  Medically stable for discharge.  He will follow-up with orthopedics as an outpatient.  Following problems were addressed during the hospitalization:  Right distal femur displaced and comminuted fracture.  Status post ORIF -Started on Lovenox for DVT prophylaxis; patient was on Plavix in the past.  Being continued.  Orthopedic surgery started  aspirin for DVT prophylaxis. -WB: Toe-touch weightbearing right lower extremity  -Orthopedics will follow him up as an outpatient    History of chronic combined systolic and diastolic heart failure.   Currently compensated.  Takes torsemide 20 mg daily, digoxin and Farxiga at home.   Continue metoprolol.  Will restart torsemide 20 mg daily,Farxiga    GERD Was taking Pepcid.  Will change to Protonix.  Patient is currently on aspirin   Hyperlipidemia On Zetia at home.  Will resume.   Hypertension Blood pressure soft.  Continue metoprolol at low dose   Hypothyroidism.   On Synthroid at home.  TSH elevated at 13.22, was 15.43 on 05/19/2023 -Dose of Synthroid was  changed from 50 mcg daily to 75 mcg daily by PCP on 05/19/2023   History of polymyalgia rheumatica.   Has a home health aide and walker or wheelchair at home. Now the plan is for SnF     Discharge Diagnoses:  Principal Problem:   Femur fracture (HCC) Active Problems:   Essential hypertension   GERD (gastroesophageal reflux disease)   Hypothyroid   Fall at home, initial encounter    Discharge Instructions  Discharge Instructions     Diet - low sodium heart healthy   Complete by: As directed    Discharge instructions   Complete by: As directed    1)Please take your medications as instructed  2)Follow up with orthopedics in 2 weeks for wound check and x-rays.  Name and number the provider has been attached   Increase activity slowly   Complete by: As directed       Allergies as of 05/28/2023       Reactions   Statins Other (See Comments)   Muscle pain, upset stomach, and leg pain        Medication List     STOP taking these medications    benazepril 5 MG tablet Commonly known as: LOTENSIN   famotidine 20 MG tablet Commonly known as: PEPCID       TAKE these medications    aspirin EC 81 MG tablet Take 1 tablet (81 mg total) by mouth 2 (two) times daily for 28 days. Swallow whole.   betamethasone dipropionate 0.05 % cream Apply 1 application  topically as needed (rash).   clopidogrel 75 MG tablet Commonly known as: PLAVIX Take 1 tablet (  75 mg total) by mouth daily.   dapagliflozin propanediol 10 MG Tabs tablet Commonly known as: FARXIGA Take 1 tablet (10 mg total) by mouth daily.   digoxin 0.125 MG tablet Commonly known as: LANOXIN Take 1 tablet (0.125 mg total) by mouth daily.   ezetimibe 10 MG tablet Commonly known as: ZETIA Take 1 tablet (10 mg total) by mouth daily.   finasteride 5 MG tablet Commonly known as: PROSCAR TAKE ONE TABLET BY MOUTH ONCE DAILY What changed:  how much to take how to take this when to take this additional  instructions   gabapentin 100 MG capsule Commonly known as: NEURONTIN TAKE 1 CAPSULE 4 TIMES     DAILY   HYDROcodone-acetaminophen 5-325 MG tablet Commonly known as: NORCO/VICODIN Take 1 tablet by mouth every 6 (six) hours as needed for up to 5 days for moderate pain (pain score 4-6).   levothyroxine 75 MCG tablet Commonly known as: SYNTHROID Take 1 tablet (75 mcg total) by mouth daily.   magnesium oxide 400 (240 Mg) MG tablet Commonly known as: MAG-OX Take 400 mg by mouth at bedtime.   metoprolol succinate 25 MG 24 hr tablet Commonly known as: TOPROL-XL Take 0.5 tablets (12.5 mg total) by mouth daily. What changed: how much to take   pantoprazole 40 MG tablet Commonly known as: Protonix Take 1 tablet (40 mg total) by mouth daily.   polyethylene glycol 17 g packet Commonly known as: MIRALAX / GLYCOLAX Take 17 g by mouth daily as needed for moderate constipation. What changed: when to take this   senna-docusate 8.6-50 MG tablet Commonly known as: Senokot-S Take 1 tablet by mouth 2 (two) times daily.   torsemide 20 MG tablet Commonly known as: DEMADEX Take 1 tablet (20 mg total) by mouth daily.        Follow-up Information     Joen Laura, MD Follow up in 2 week(s).   Specialty: Orthopedic Surgery Contact information: [address] [place] [address] [phone]                Allergies  Allergen Reactions   Statins Other (See Comments)    Muscle pain, upset stomach, and leg pain    Consultations: Orthopedics   Procedures/Studies: DG FEMUR PORT, MIN 2 VIEWS RIGHT Result Date: 05/24/2023 CLINICAL DATA:  Femur fracture, postop. EXAM: RIGHT FEMUR PORTABLE 2 VIEW COMPARISON:  Preoperative imaging. FINDINGS: Moderate plate and screw fixation of distal femur fracture. Improved fracture alignment from preoperative imaging. Recent postsurgical change includes air and edema in the soft tissues. There is been prior tibial fixation.  Prominent vascular calcifications. IMPRESSION: ORIF distal femur fracture without immediate postoperative complication. Electronically Signed   By: Narda Rutherford M.D.   On: 05/24/2023 12:14   DG FEMUR, MIN 2 VIEWS RIGHT Result Date: 05/24/2023 CLINICAL DATA:  Elective surgery. EXAM: RIGHT FEMUR 2 VIEWS COMPARISON:  Preoperative imaging FINDINGS: Eleven fluoroscopic spot views of the femur submitted from the operating room. Lateral plate and screw fixation of distal femur fracture. There is been prior proximal tibial fixation. Fluoroscopy time 50 seconds. Dose 4.04 mGy. IMPRESSION: Procedural fluoroscopy during femur fracture fixation. Electronically Signed   By: Narda Rutherford M.D.   On: 05/24/2023 12:12   DG C-Arm 1-60 Min-No Report Result Date: 05/24/2023 Fluoroscopy was utilized by the requesting physician.  No radiographic interpretation.   DG C-Arm 1-60 Min-No Report Result Date: 05/24/2023 Fluoroscopy was utilized by the requesting physician.  No radiographic interpretation.  CT FEMUR RIGHT WO CONTRAST Result Date: 05/23/2023 CLINICAL DATA:  Upper leg trauma.  Distal femur fracture. EXAM: CT OF THE LOWER RIGHT EXTREMITY WITHOUT CONTRAST TECHNIQUE: Multidetector CT imaging of the right 5 was performed according to the standard protocol. RADIATION DOSE REDUCTION: This exam was performed according to the departmental dose-optimization program which includes automated exposure control, adjustment of the mA and/or kV according to patient size and/or use of iterative reconstruction technique. COMPARISON:  Radiographs same date. FINDINGS: Bones/Joint/Cartilage Examination includes the entire right thigh. There is some motion on the images through the distal femur. Additional imaging (in a separate acquisition) of the knee was obtained. There is a comminuted and displaced fracture of the distal right femoral metadiaphysis. There is an associated 7.4 cm butterfly fragment anteriorly. There is  posteromedial displacement of the distal fragment. This fracture demonstrates no definite intercondylar extension or involvement of the articular surface of the distal femur. There are advanced underlying tricompartmental degenerative changes at the right knee with associated joint space narrowing and osteophytes. Patient is status post proximal tibial intramedullary nail fixation. There are old healed fractures of the proximal tibia and fibula. Small knee joint effusion without definite lipohemarthrosis. No proximal femoral injury identified. Mild right hip degenerative changes without significant hip joint effusion. Ligaments Suboptimally assessed by CT. Muscles and Tendons The extensor mechanism at the knee is intact. There is fragmented spurring at the quadriceps insertion on the patella. No intramuscular hematoma or focal atrophy identified. Soft tissues There is ill-defined soft tissue hemorrhage posteriorly in the distal right thigh at the level of the comminuted fracture. No focal hematoma, foreign body or soft tissue emphysema identified. Diffuse femoropopliteal atherosclerosis. Right inguinal hernia containing bowel noted incidentally. IMPRESSION: 1. Comminuted and displaced fracture of the distal right femoral metadiaphysis with associated 7.4 cm butterfly fragment anteriorly. No definite intercondylar extension or involvement of the articular surface of the distal femur. 2. Advanced tricompartmental degenerative changes at the right knee. 3. Old healed fractures of the proximal tibia and fibula status post proximal tibial intramedullary nail fixation. 4. Ill-defined soft tissue hemorrhage posteriorly in the distal right thigh. 5. Right inguinal hernia containing bowel noted incidentally. Electronically Signed   By: Carey Bullocks M.D.   On: 05/23/2023 16:37   DG Tibia/Fibula Right Result Date: 05/23/2023 CLINICAL DATA:  Fall.  Right lower extremity pain. EXAM: RIGHT TIBIA AND FIBULA - 2 VIEW; RIGHT  FEMUR 2 VIEWS; RIGHT ANKLE - COMPLETE 3+ VIEW COMPARISON:  02/22/2015. FINDINGS: There is diffuse osteopenia of the visualized osseous structures. There is displaced, angulated and comminuted fracture of the distal right femur. No intra-articular extension seen. There is old healed fracture of the proximal right tibia, status post intramedullary nail placement. No other acute fracture or dislocation. No aggressive osseous lesion. Mild-to-moderate degenerative changes of the knee joint. No significant degenerative changes of the right hip joint and ankle joint. Ankle mortise appears intact. No focal soft tissue swelling. No radiopaque foreign bodies. IMPRESSION: *Displaced, angulated and comminuted fracture of the distal right femur. No intra-articular extension. Electronically Signed   By: Jules Schick M.D.   On: 05/23/2023 13:11   DG Ankle Complete Right Result Date: 05/23/2023 CLINICAL DATA:  Fall.  Right lower extremity pain. EXAM: RIGHT TIBIA AND FIBULA - 2 VIEW; RIGHT FEMUR 2 VIEWS; RIGHT ANKLE - COMPLETE 3+ VIEW COMPARISON:  02/22/2015. FINDINGS: There is diffuse osteopenia of the visualized osseous structures. There is displaced, angulated and comminuted fracture of the distal right femur. No intra-articular extension  seen. There is old healed fracture of the proximal right tibia, status post intramedullary nail placement. No other acute fracture or dislocation. No aggressive osseous lesion. Mild-to-moderate degenerative changes of the knee joint. No significant degenerative changes of the right hip joint and ankle joint. Ankle mortise appears intact. No focal soft tissue swelling. No radiopaque foreign bodies. IMPRESSION: *Displaced, angulated and comminuted fracture of the distal right femur. No intra-articular extension. Electronically Signed   By: Jules Schick M.D.   On: 05/23/2023 13:11   DG FEMUR, MIN 2 VIEWS RIGHT Result Date: 05/23/2023 CLINICAL DATA:  Fall.  Right lower extremity pain.  EXAM: RIGHT TIBIA AND FIBULA - 2 VIEW; RIGHT FEMUR 2 VIEWS; RIGHT ANKLE - COMPLETE 3+ VIEW COMPARISON:  02/22/2015. FINDINGS: There is diffuse osteopenia of the visualized osseous structures. There is displaced, angulated and comminuted fracture of the distal right femur. No intra-articular extension seen. There is old healed fracture of the proximal right tibia, status post intramedullary nail placement. No other acute fracture or dislocation. No aggressive osseous lesion. Mild-to-moderate degenerative changes of the knee joint. No significant degenerative changes of the right hip joint and ankle joint. Ankle mortise appears intact. No focal soft tissue swelling. No radiopaque foreign bodies. IMPRESSION: *Displaced, angulated and comminuted fracture of the distal right femur. No intra-articular extension. Electronically Signed   By: Jules Schick M.D.   On: 05/23/2023 13:11   [place] LIMITED JOINT SPACE STRUCTURES UP LEFT(NO LINKED CHARGES) Result Date: 05/11/2023 No images saved      Subjective: Patient seen and examined at bedside today.  Hemodynamically stable.  Overall comfortable.  Pain well-controlled.  Had a bowel movement yesterday.  Denies new complaints.  Medically stable for discharge.  Discharge Exam: Vitals:   05/27/23 2121 05/28/23 0531  BP: 108/63 (!) 106/57  Pulse: 70 74  Resp: 18 16  Temp: 98.2 F (36.8 C) 98.2 F (36.8 C)  SpO2: 97% 96%   Vitals:   05/27/23 0920 05/27/23 1350 05/27/23 2121 05/28/23 0531  BP: 112/62 124/64 108/63 (!) 106/57  Pulse: 76 85 70 74  Resp: 18 18 18 16   Temp: 97.8 F (36.6 C)  98.2 F (36.8 C) 98.2 F (36.8 C)  TempSrc: Oral  Oral Oral  SpO2: 96% 99% 97% 96%  Weight:      Height:        General: Pt is alert, awake, not in acute distress Cardiovascular: RRR, S1/S2 +, no rubs, no gallops Respiratory: CTA bilaterally, no wheezing, no rhonchi Abdominal: Soft, NT, ND, bowel sounds + Extremities: no edema, no cyanosis, clean surgical wound  on the right leg    The results of significant diagnostics from this hospitalization (including imaging, microbiology, ancillary and laboratory) are listed below for reference.     Microbiology: Recent Results (from the past 240 hours)  Surgical PCR screen     Status: None   Collection Time: 05/24/23 12:49 AM   Specimen: Nasal Mucosa; Nasal Swab  Result Value Ref Range Status   MRSA, PCR NEGATIVE NEGATIVE Final   Staphylococcus aureus NEGATIVE NEGATIVE Final    Comment: (NOTE) The Xpert SA Assay (FDA approved for NASAL specimens in patients 20 years of age and older), is one component of a comprehensive surveillance program. It is not intended to diagnose infection nor to guide or monitor treatment. Performed at [facility], [address], [place], [place] [address]      Labs: BNP (last 3 results) Recent Labs    12/03/22 0931 01/21/23  1704 02/04/23 0802  BNP 281.1* 604.5* 715.8*   Basic Metabolic Panel: Recent Labs  Lab 05/23/23 1542 05/24/23 0400 05/25/23 0336  NA 136 138 132*  K 3.7 4.1 4.2  CL 99 99 96*  CO2 26 30 26   GLUCOSE 117* 109* 119*  BUN 22 27* 25*  CREATININE 0.89 1.12 0.91  CALCIUM 9.6 9.8 9.0  MG  --  2.6*  --    Liver Function Tests: Recent Labs  Lab 05/25/23 0336  AST 19  ALT 12  ALKPHOS 47  BILITOT 0.7  PROT 5.9*  ALBUMIN 2.7*   No results for input(s): "LIPASE", "AMYLASE" in the last 168 hours. No results for input(s): "AMMONIA" in the last 168 hours. CBC: Recent Labs  Lab 05/23/23 1542 05/24/23 0400 05/25/23 0336  WBC 10.6* 7.1 8.1  HGB 14.3 13.8 11.2*  HCT 43.6 44.1 35.8*  MCV 95.6 99.5 100.3*  PLT 195 185 151   Cardiac Enzymes: No results for input(s): "CKTOTAL", "CKMB", "CKMBINDEX", "TROPONINI" in the last 168 hours. BNP: Invalid input(s): "POCBNP" CBG: Recent Labs  Lab 05/24/23 2152  GLUCAP 135*   D-Dimer No results for input(s): "DDIMER" in the last 72 hours. Hgb A1c No results for  input(s): "HGBA1C" in the last 72 hours. Lipid Profile No results for input(s): "CHOL", "HDL", "LDLCALC", "TRIG", "CHOLHDL", "LDLDIRECT" in the last 72 hours. Thyroid function studies No results for input(s): "TSH", "T4TOTAL", "T3FREE", "THYROIDAB" in the last 72 hours.  Invalid input(s): "FREET3" Anemia work up No results for input(s): "VITAMINB12", "FOLATE", "FERRITIN", "TIBC", "IRON", "RETICCTPCT" in the last 72 hours. Urinalysis    Component Value Date/Time   COLORURINE YELLOW 01/22/2023 0930   APPEARANCEUR HAZY (A) 01/22/2023 0930   LABSPEC 1.012 01/22/2023 0930   PHURINE 6.0 01/22/2023 0930   GLUCOSEU NEGATIVE 01/22/2023 0930   HGBUR NEGATIVE 01/22/2023 0930   HGBUR negative 11/30/2009 1133   BILIRUBINUR NEGATIVE 01/22/2023 0930   KETONESUR NEGATIVE 01/22/2023 0930   PROTEINUR NEGATIVE 01/22/2023 0930   UROBILINOGEN 0.2 03/29/2012 1458   NITRITE POSITIVE (A) 01/22/2023 0930   LEUKOCYTESUR LARGE (A) 01/22/2023 0930   Sepsis Labs Recent Labs  Lab 05/23/23 1542 05/24/23 0400 05/25/23 0336  WBC 10.6* 7.1 8.1   Microbiology Recent Results (from the past 240 hours)  Surgical PCR screen     Status: None   Collection Time: 05/24/23 12:49 AM   Specimen: Nasal Mucosa; Nasal Swab  Result Value Ref Range Status   MRSA, PCR NEGATIVE NEGATIVE Final   Staphylococcus aureus NEGATIVE NEGATIVE Final    Comment: (NOTE) The Xpert SA Assay (FDA approved for NASAL specimens in patients 70 years of age and older), is one component of a comprehensive surveillance program. It is not intended to diagnose infection nor to guide or monitor treatment. Performed at [facility], [address], [place], [place] [address]     Please note: You were cared for by a hospitalist during your hospital stay. Once you are discharged, your primary care physician will handle any further medical issues. Please note that NO REFILLS for any discharge medications will be authorized  once you are discharged, as it is imperative that you return to your primary care physician (or establish a relationship with a primary care physician if you do not have one) for your post hospital discharge needs so that they can reassess your need for medications and monitor your lab values.    Time coordinating discharge: 40 minutes  SIGNED:   Burnadette Pop, MD  [facility] 05/28/2023, 9:27 AM Pager [phone]  If 7PM-7AM, please contact night-coverage [email or web address] Password [email or web address]

## 2023-05-28 NOTE — Progress Notes (Signed)
 Occupational Therapy Treatment Patient Details Name: Darin [name] MRN: [id number] DOB: [birth year] Today's Date: 05/28/2023   History of present illness Patient is a 89 year old male admitted after fall at home resulting in Right displaced extra-articular supracondylar distal femur fracture. Ortho consulted, pt is now  s/p ORIF R supracondylar distal femur fracture with plate and screws on 05/24/23. WUJ:[id number] HF, GERD, hyperlipidemia, HTN, osteoporosis, polymyalgia rheumatica, CTR   OT comments  Pt to discharge to rehab later today, did not transfer to chair, but practiced lateral scoot at EOB with min assist after pt completed grooming and UB dressing seated at EOB with supervision. Pt eager to discharge to rehab and return to PLOF. Patient will benefit from continued inpatient follow up therapy, <3 hours/day       If plan is discharge home, recommend the following:  Two people to help with walking and/or transfers;Assistance with cooking/housework;A lot of help with bathing/dressing/bathroom;Direct supervision/assist for medications management;Assist for transportation;Help with stairs or ramp for entrance;Direct supervision/assist for financial management   Equipment Recommendations  None recommended by OT    Recommendations for Other Services      Precautions / Restrictions Precautions Precautions: Fall Recall of Precautions/Restrictions: Intact Restrictions Weight Bearing Restrictions Per Provider Order: Yes RLE Weight Bearing Per Provider Order: Touchdown weight bearing       Mobility Bed Mobility Overal bed mobility: Needs Assistance Bed Mobility: Supine to Sit, Sit to Supine     Supine to sit: Min assist, HOB elevated, Used rails Sit to supine: Min assist   General bed mobility comments: assist for R LE, increased time    Transfers Overall transfer level: Needs assistance                Lateral/Scoot Transfers: Min assist General transfer comment: lateral  scoot along EOB with assist of bed pad     Balance Overall balance assessment: History of Falls, Needs assistance   Sitting balance-Leahy Scale: Good                                     ADL either performed or assessed with clinical judgement   ADL Overall ADL's : Needs assistance/impaired Eating/Feeding: Independent;Sitting   Grooming: Wash/dry hands;Wash/dry face;Oral care;Sitting;Supervision/safety           Upper Body Dressing : Sitting;Supervision/safety;Set up                          Extremity/Trunk Assessment              Vision       Perception     Praxis     Communication Communication Communication: No apparent difficulties   Cognition Arousal: Alert Behavior During Therapy: WFL for tasks assessed/performed Cognition: Cognition impaired       Memory impairment (select all impairments): Short-term memory                       Following commands: Intact        Cueing      Exercises      Shoulder Instructions       General Comments      Pertinent Vitals/ Pain       Pain Assessment Pain Assessment: Faces Faces Pain Scale: Hurts little more Pain Location: R LE/knee Pain Descriptors / Indicators: Guarding, Grimacing, Sore Pain Intervention(s): Monitored during session, Repositioned  Home Living                                          Prior Functioning/Environment              Frequency  Min 1X/week        Progress Toward Goals  OT Goals(current goals can now be found in the care plan section)  Progress towards OT goals: Progressing toward goals  Acute Rehab OT Goals OT Goal Formulation: With patient Time For Goal Achievement: 06/08/23 Potential to Achieve Goals: Fair  Plan      Co-evaluation                 AM-PAC OT "6 Clicks" Daily Activity     Outcome Measure   Help from another person eating meals?: None Help from another person taking care of  personal grooming?: A Little Help from another person toileting, which includes using toliet, bedpan, or urinal?: Total Help from another person bathing (including washing, rinsing, drying)?: A Lot Help from another person to put on and taking off regular upper body clothing?: A Little Help from another person to put on and taking off regular lower body clothing?: A Little 6 Click Score: 16    End of Session    OT Visit Diagnosis: Unsteadiness on feet (R26.81);Other abnormalities of gait and mobility (R26.89);Pain   Activity Tolerance Patient tolerated treatment well   Patient Left in bed;with call bell/phone within reach;with bed alarm set   Nurse Communication          Time: 5284-1324 OT Time Calculation (min): 14 min  Charges: OT General Charges $OT Visit: 1 Visit OT Treatments $Self Care/Home Management : 8-22 mins  Berna Spare, OTR/L Acute Rehabilitation Services Office: [phone]   Evern Bio 05/28/2023, 11:22 AM

## 2023-05-28 NOTE — TOC Transition Note (Signed)
 Transition of Care [facility]) - Discharge Note   Patient Details  Name: Darin [name] MRN: [id number] Date of Birth: 1931/10/29  Transition of Care [facility]) CM/SW Contact:  Amada Jupiter, LCSW Phone Number: 05/28/2023, 11:37 AM   Clinical Narrative:    Pt has accepted SNF bed at [facility] who can admit today and pt medically cleared for dc.  Pt and cousin aware and agreeable.  PTAR called at 11:35am.  RN to call report to [phone].  No further TOC needs.   Final next level of care: Skilled Nursing Facility Barriers to Discharge: Barriers Resolved   Patient Goals and CMS Choice Patient states their goals for this hospitalization and ongoing recovery are:: return home following rehab          Discharge Placement PASRR number recieved: 05/26/23            Patient chooses bed at: [facility] Patient to be transferred to facility by: PTAR Name of family member notified: cousin, Elita Quick Patient and family notified of of transfer: 05/28/23  Discharge Plan and Services Additional resources added to the After Visit Summary for   In-house Referral: Clinical Social Work   Post Acute Care Choice: Skilled Nursing Facility          DME Arranged: N/A DME Agency: NA                  Social Drivers of Health (SDOH) Interventions SDOH Screenings   Food Insecurity: No Food Insecurity (05/23/2023)  Housing: Low Risk  (05/23/2023)  Transportation Needs: No Transportation Needs (05/23/2023)  Utilities: Not At Risk (05/23/2023)  Alcohol Screen: Low Risk  (08/30/2022)  Depression (PHQ2-9): Low Risk  (08/30/2022)  Financial Resource Strain: Low Risk  (08/30/2022)  Physical Activity: Sufficiently Active (08/30/2022)  Social Connections: Moderately Isolated (05/23/2023)  Stress: No Stress Concern Present (08/30/2022)  Tobacco Use: Low Risk  (05/24/2023)     Readmission Risk Interventions    05/26/2023    3:10 PM  Readmission Risk Prevention Plan  Transportation Screening  Complete  PCP or Specialist Appt within 5-7 Days Complete  Home Care Screening Complete  Medication Review (RN CM) Complete

## 2023-05-28 NOTE — Care Management Important Message (Signed)
 Important Message  Patient Details IM Letter given to Patient Name: Ladarious Kresse MRN: [id number] Date of Birth: 01-26-1932   Important Message Given:  Yes - Medicare IM     Caren Macadam 05/28/2023, 10:12 AM

## 2023-05-29 DIAGNOSIS — R2689 Other abnormalities of gait and mobility: Secondary | ICD-10-CM | POA: Diagnosis not present

## 2023-05-29 DIAGNOSIS — I509 Heart failure, unspecified: Secondary | ICD-10-CM | POA: Diagnosis not present

## 2023-05-29 DIAGNOSIS — I10 Essential (primary) hypertension: Secondary | ICD-10-CM | POA: Diagnosis not present

## 2023-05-29 DIAGNOSIS — I480 Paroxysmal atrial fibrillation: Secondary | ICD-10-CM | POA: Diagnosis not present

## 2023-05-29 DIAGNOSIS — S72451D Displaced supracondylar fracture without intracondylar extension of lower end of right femur, subsequent encounter for closed fracture with routine healing: Secondary | ICD-10-CM | POA: Diagnosis not present

## 2023-05-29 DIAGNOSIS — E039 Hypothyroidism, unspecified: Secondary | ICD-10-CM | POA: Diagnosis not present

## 2023-05-29 DIAGNOSIS — E785 Hyperlipidemia, unspecified: Secondary | ICD-10-CM | POA: Diagnosis not present

## 2023-05-29 DIAGNOSIS — R296 Repeated falls: Secondary | ICD-10-CM | POA: Diagnosis not present

## 2023-05-29 DIAGNOSIS — M353 Polymyalgia rheumatica: Secondary | ICD-10-CM | POA: Diagnosis not present

## 2023-05-29 DIAGNOSIS — Z9181 History of falling: Secondary | ICD-10-CM | POA: Diagnosis not present

## 2023-05-29 DIAGNOSIS — R2681 Unsteadiness on feet: Secondary | ICD-10-CM | POA: Diagnosis not present

## 2023-05-29 DIAGNOSIS — M6281 Muscle weakness (generalized): Secondary | ICD-10-CM | POA: Diagnosis not present

## 2023-06-02 DIAGNOSIS — R2681 Unsteadiness on feet: Secondary | ICD-10-CM | POA: Diagnosis not present

## 2023-06-02 DIAGNOSIS — S72451D Displaced supracondylar fracture without intracondylar extension of lower end of right femur, subsequent encounter for closed fracture with routine healing: Secondary | ICD-10-CM | POA: Diagnosis not present

## 2023-06-02 DIAGNOSIS — R2689 Other abnormalities of gait and mobility: Secondary | ICD-10-CM | POA: Diagnosis not present

## 2023-06-02 DIAGNOSIS — Z9181 History of falling: Secondary | ICD-10-CM | POA: Diagnosis not present

## 2023-06-02 DIAGNOSIS — M6281 Muscle weakness (generalized): Secondary | ICD-10-CM | POA: Diagnosis not present

## 2023-06-02 DIAGNOSIS — I509 Heart failure, unspecified: Secondary | ICD-10-CM | POA: Diagnosis not present

## 2023-06-03 DIAGNOSIS — S72451D Displaced supracondylar fracture without intracondylar extension of lower end of right femur, subsequent encounter for closed fracture with routine healing: Secondary | ICD-10-CM | POA: Diagnosis not present

## 2023-06-03 DIAGNOSIS — I509 Heart failure, unspecified: Secondary | ICD-10-CM | POA: Diagnosis not present

## 2023-06-03 DIAGNOSIS — I10 Essential (primary) hypertension: Secondary | ICD-10-CM | POA: Diagnosis not present

## 2023-06-04 ENCOUNTER — Other Ambulatory Visit: Payer: Self-pay | Admitting: *Deleted

## 2023-06-04 NOTE — Patient Outreach (Signed)
 Darin [name] recently admitted to [facility]. Screening for potential complex care management services as benefit of health plan and primary care provider.  Collaborative meeting with [name] social worker. Darin [name] is from home alone. Has private duty assistance 5 days a week. Discussed writer is following for complex care management needs.   Went to bedside. However, Darin [name] was sleeping soundly. Will follow up at another time.   Will continue to follow.  Darin Noble, MSN, RN, BSN [place]  [facility], [facility] RN Post- Acute Care Manager Direct Dial: [phone]

## 2023-06-05 DIAGNOSIS — I480 Paroxysmal atrial fibrillation: Secondary | ICD-10-CM | POA: Diagnosis not present

## 2023-06-05 DIAGNOSIS — I509 Heart failure, unspecified: Secondary | ICD-10-CM | POA: Diagnosis not present

## 2023-06-05 DIAGNOSIS — E785 Hyperlipidemia, unspecified: Secondary | ICD-10-CM | POA: Diagnosis not present

## 2023-06-05 DIAGNOSIS — I10 Essential (primary) hypertension: Secondary | ICD-10-CM | POA: Diagnosis not present

## 2023-06-05 DIAGNOSIS — E039 Hypothyroidism, unspecified: Secondary | ICD-10-CM | POA: Diagnosis not present

## 2023-06-05 DIAGNOSIS — S72451D Displaced supracondylar fracture without intracondylar extension of lower end of right femur, subsequent encounter for closed fracture with routine healing: Secondary | ICD-10-CM | POA: Diagnosis not present

## 2023-06-11 ENCOUNTER — Other Ambulatory Visit: Payer: Self-pay | Admitting: *Deleted

## 2023-06-11 NOTE — Patient Outreach (Signed)
 Post-Acute Care Manager follow up. Per [facility], Mr. Jyl Heinz expired on 06/18/2023.   Confirmed with [facility] social worker. Mr. Hallinan expired at [facility].   Will alert PCP.   Raiford Noble, MSN, RN, BSN [place]  [facility], [facility] RN Post- Acute Care Manager Direct Dial: [phone]

## 2023-06-17 ENCOUNTER — Telehealth: Payer: Self-pay

## 2023-06-17 NOTE — Progress Notes (Deleted)
 [name] [address] [place] [address] Phone: [phone] Subjective:    I'm seeing this patient by the request  of:  [name], MD  CC:   [id number]  Darin [name] is a 89 y.o. male coming in with complaint of ***  Onset-  Location Duration-  Character- Aggravating factors- Reliving factors-  Therapies tried-  Severity-     Past Medical History:  Diagnosis Date   Arthritis    Chronic HFrEF (heart failure with reduced ejection fraction) (HCC)    DUPUYTREN'S CONTRACTURE, RIGHT 07/04/2009   Annotation: 4th digit Qualifier: Diagnosis of  By: Lovell Sheehan MD, John E    GERD (gastroesophageal reflux disease)    H/O hiatal hernia    "FOR YEARS" " NO PROBLEM"   Hyperlipidemia    Hypertension    Knee pain    Neuropathy    PERIPHERAL    Osteoporosis    PMR (polymyalgia rheumatica) (HCC)    Polymyalgia (HCC) 5 YEARS   Past Surgical History:  Procedure Laterality Date   CARPAL TUNNEL RELEASE     CATARACT EXTRACTION     right   COLONOSCOPY  2007   INGUINAL HERNIA REPAIR  10/14/2011   Procedure: HERNIA REPAIR INGUINAL ADULT;  Surgeon: Shelly Rubenstein, MD;  Location: [facility] OR;  Service: General;  Laterality: Right;  Right Inguinal Hernia Repair with Mesh   ORIF FEMUR FRACTURE Right 05/24/2023   Procedure: OPEN REDUCTION INTERNAL FIXATION (ORIF) DISTAL FEMUR FRACTURE;  Surgeon: Joen Laura, MD;  Location: [facility] ORS;  Service: Orthopedics;  Laterality: Right;   TIBIA IM NAIL INSERTION Right 05/22/2021   Procedure: INTRAMEDULLARY (IM) NAIL TIBIAL;  Surgeon: Myrene Galas, MD;  Location: [facility] OR;  Service: Orthopedics;  Laterality: Right;   TONSILLECTOMY     VARICOSE VEIN SURGERY     Social History   Socioeconomic History   Marital status: Widowed    Spouse name: Not on file   Number of children: Not on file   Years of education: Not on file   Highest education level: Master's degree (e.g., MA, MS, MEng, MEd, MSW, MBA)   Occupational History   Occupation: retired    Associate Professor: RETIRED  Tobacco Use   Smoking status: Never   Smokeless tobacco: Never  Vaping Use   Vaping status: Never Used  Substance and Sexual Activity   Alcohol use: Not Currently    Alcohol/week: 3.0 standard drinks of alcohol    Types: 3 Glasses of wine per week    Comment: half glass wine before dinner   Drug use: No   Sexual activity: Not Currently  Other Topics Concern   Not on file  Social History Narrative   Not on file   Social Drivers of Health   Financial Resource Strain: Low Risk  (08/30/2022)   Overall Financial Resource Strain (CARDIA)    Difficulty of Paying Living Expenses: Not hard at all  Food Insecurity: No Food Insecurity (05/23/2023)   Hunger Vital Sign    Worried About Running Out of Food in the Last Year: Never true    Ran Out of Food in the Last Year: Never true  Transportation Needs: No Transportation Needs (05/23/2023)   PRAPARE - Administrator, Civil Service (Medical): No    Lack of Transportation (Non-Medical): No  Physical Activity: Sufficiently Active (08/30/2022)   Exercise Vital Sign    Days of Exercise per Week: 7 days    Minutes of Exercise per  Session: 40 min  Stress: No Stress Concern Present (08/30/2022)   [employer] of Occupational Health - Occupational Stress Questionnaire    Feeling of Stress : Not at all  Social Connections: Moderately Isolated (05/23/2023)   Social Connection and Isolation Panel [NHANES]    Frequency of Communication with Friends and Family: Three times a week    Frequency of Social Gatherings with Friends and Family: Once a week    Attends Religious Services: More than 4 times per year    Active Member of [employer] or Organizations: No    Attends Banker Meetings: Never    Marital Status: Widowed   Allergies  Allergen Reactions   Statins Other (See Comments)    Muscle pain, upset stomach, and leg pain   Family History  Problem Relation  Age of Onset   Heart disease Father    Coronary artery disease Other     Current Outpatient Medications (Endocrine & Metabolic):    dapagliflozin propanediol (FARXIGA) 10 MG TABS tablet, Take 1 tablet (10 mg total) by mouth daily.   levothyroxine (SYNTHROID) 75 MCG tablet, Take 1 tablet (75 mcg total) by mouth daily.  Current Outpatient Medications (Cardiovascular):    digoxin (LANOXIN) 0.125 MG tablet, Take 1 tablet (0.125 mg total) by mouth daily.   ezetimibe (ZETIA) 10 MG tablet, Take 1 tablet (10 mg total) by mouth daily.   metoprolol succinate (TOPROL-XL) 25 MG 24 hr tablet, Take 0.5 tablets (12.5 mg total) by mouth daily.   torsemide (DEMADEX) 20 MG tablet, Take 1 tablet (20 mg total) by mouth daily.   Current Outpatient Medications (Analgesics):    aspirin EC 81 MG tablet, Take 1 tablet (81 mg total) by mouth 2 (two) times daily for 28 days. Swallow whole.  Current Outpatient Medications (Hematological):    clopidogrel (PLAVIX) 75 MG tablet, Take 1 tablet (75 mg total) by mouth daily.  Current Outpatient Medications (Other):    betamethasone dipropionate 0.05 % cream, Apply 1 application  topically as needed (rash).   finasteride (PROSCAR) 5 MG tablet, TAKE ONE TABLET BY MOUTH ONCE DAILY   gabapentin (NEURONTIN) 100 MG capsule, TAKE 1 CAPSULE 4 TIMES     DAILY   magnesium oxide (MAG-OX) 400 (240 Mg) MG tablet, Take 400 mg by mouth at bedtime.   pantoprazole (PROTONIX) 40 MG tablet, Take 1 tablet (40 mg total) by mouth daily.   polyethylene glycol (MIRALAX / GLYCOLAX) packet, Take 17 g by mouth daily as needed for moderate constipation. (Patient taking differently: Take 17 g by mouth 2 (two) times daily.)   senna-docusate (SENOKOT-S) 8.6-50 MG tablet, Take 1 tablet by mouth 2 (two) times daily.   Reviewed prior external information including notes and imaging from  primary care provider As well as notes that were available from care everywhere and other healthcare  systems.  Past medical history, social, surgical and family history all reviewed in electronic medical record.  No pertanent information unless stated regarding to the chief complaint.   Review of Systems:  No headache, visual changes, nausea, vomiting, diarrhea, constipation, dizziness, abdominal pain, skin rash, fevers, chills, night sweats, weight loss, swollen lymph nodes, body aches, joint swelling, chest pain, shortness of breath, mood changes. POSITIVE muscle aches  Objective  There were no vitals taken for this visit.   General: No apparent distress alert and oriented x3 mood and affect normal, dressed appropriately.  HEENT: Pupils equal, extraocular movements intact  Respiratory: Patient's speak in full sentences and does not  appear short of breath  Cardiovascular: No lower extremity edema, non tender, no erythema      Impression and Recommendations:

## 2023-06-17 NOTE — Telephone Encounter (Signed)
 Called patient and left VM. He will need to reschedule appt tomorrow for 3 months from surgery date.

## 2023-06-18 ENCOUNTER — Ambulatory Visit: Payer: Medicare Other | Admitting: Family Medicine

## 2023-06-18 NOTE — Telephone Encounter (Signed)
 Attempted to contact patient again to reschedule appointment.

## 2023-06-24 DEATH — deceased

## 2023-07-07 ENCOUNTER — Ambulatory Visit: Payer: Medicare Other | Admitting: Podiatry

## 2023-08-20 ENCOUNTER — Other Ambulatory Visit: Payer: Self-pay | Admitting: Family Medicine

## 2023-08-20 ENCOUNTER — Ambulatory Visit: Payer: PRIVATE HEALTH INSURANCE | Attending: Cardiovascular Disease | Admitting: Cardiovascular Disease

## 2023-08-21 ENCOUNTER — Encounter: Payer: Self-pay | Admitting: Cardiovascular Disease

## 2023-09-03 ENCOUNTER — Telehealth: Payer: Self-pay

## 2023-09-03 NOTE — Telephone Encounter (Signed)
 Unsuccessful attempts to reach patient on preferred numbers listed in notes for scheduled AWV Unable to leave a message.

## 2023-09-15 ENCOUNTER — Ambulatory Visit: Payer: Medicare Other | Admitting: Family Medicine
